# Patient Record
Sex: Female | Born: 1935 | Race: White | Hispanic: No | Smoking: Never smoker
Health system: Southern US, Community
[De-identification: ages and names within clinical notes are randomized; demographics above are authoritative.]

## PROBLEM LIST (undated history)

## (undated) DIAGNOSIS — I340 Nonrheumatic mitral (valve) insufficiency: Secondary | ICD-10-CM

## (undated) DIAGNOSIS — I4819 Other persistent atrial fibrillation: Secondary | ICD-10-CM

## (undated) DIAGNOSIS — I5042 Chronic combined systolic (congestive) and diastolic (congestive) heart failure: Secondary | ICD-10-CM

## (undated) DIAGNOSIS — I472 Ventricular tachycardia, unspecified: Secondary | ICD-10-CM

## (undated) DIAGNOSIS — I428 Other cardiomyopathies: Secondary | ICD-10-CM

## (undated) DIAGNOSIS — M1990 Unspecified osteoarthritis, unspecified site: Secondary | ICD-10-CM

## (undated) HISTORY — DX: Other cardiomyopathies: I42.8

## (undated) HISTORY — PX: SHOULDER SURGERY: SHX246

## (undated) HISTORY — DX: Ventricular tachycardia: I47.2

## (undated) HISTORY — PX: TONSILLECTOMY: SUR1361

## (undated) HISTORY — DX: Nonrheumatic mitral (valve) insufficiency: I34.0

## (undated) HISTORY — DX: Ventricular tachycardia, unspecified: I47.20

## (undated) HISTORY — PX: CARDIAC CATHETERIZATION: SHX172

## (undated) HISTORY — DX: Other persistent atrial fibrillation: I48.19

## (undated) HISTORY — PX: TUBAL LIGATION: SHX77

## (undated) HISTORY — PX: BUNIONECTOMY: SHX129

---

## 2013-03-17 ENCOUNTER — Encounter (HOSPITAL_COMMUNITY): Payer: Self-pay | Admitting: Emergency Medicine

## 2013-03-17 ENCOUNTER — Inpatient Hospital Stay (HOSPITAL_COMMUNITY)
Admission: EM | Admit: 2013-03-17 | Discharge: 2013-03-19 | DRG: 470 | Disposition: A | Discharge status: Home Health | Payer: Medicare Other | Attending: Internal Medicine | Admitting: Internal Medicine

## 2013-03-17 ENCOUNTER — Emergency Department (HOSPITAL_COMMUNITY): Payer: Medicare Other

## 2013-03-17 DIAGNOSIS — R296 Repeated falls: Secondary | ICD-10-CM | POA: Diagnosis present

## 2013-03-17 DIAGNOSIS — S72012A Unspecified intracapsular fracture of left femur, initial encounter for closed fracture: Secondary | ICD-10-CM

## 2013-03-17 DIAGNOSIS — S72002A Fracture of unspecified part of neck of left femur, initial encounter for closed fracture: Secondary | ICD-10-CM

## 2013-03-17 DIAGNOSIS — I4949 Other premature depolarization: Secondary | ICD-10-CM

## 2013-03-17 DIAGNOSIS — S72033A Displaced midcervical fracture of unspecified femur, initial encounter for closed fracture: Principal | ICD-10-CM | POA: Diagnosis present

## 2013-03-17 DIAGNOSIS — I517 Cardiomegaly: Secondary | ICD-10-CM

## 2013-03-17 DIAGNOSIS — S72019A Unspecified intracapsular fracture of unspecified femur, initial encounter for closed fracture: Secondary | ICD-10-CM

## 2013-03-17 DIAGNOSIS — I4589 Other specified conduction disorders: Secondary | ICD-10-CM | POA: Diagnosis present

## 2013-03-17 DIAGNOSIS — Y9389 Activity, other specified: Secondary | ICD-10-CM

## 2013-03-17 DIAGNOSIS — E785 Hyperlipidemia, unspecified: Secondary | ICD-10-CM

## 2013-03-17 DIAGNOSIS — I493 Ventricular premature depolarization: Secondary | ICD-10-CM | POA: Diagnosis present

## 2013-03-17 DIAGNOSIS — E7800 Pure hypercholesterolemia, unspecified: Secondary | ICD-10-CM | POA: Diagnosis present

## 2013-03-17 DIAGNOSIS — Z79899 Other long term (current) drug therapy: Secondary | ICD-10-CM

## 2013-03-17 HISTORY — DX: Unspecified osteoarthritis, unspecified site: M19.90

## 2013-03-17 LAB — URINALYSIS W MICROSCOPIC + REFLEX CULTURE
Bilirubin Urine: NEGATIVE
Glucose, UA: NEGATIVE mg/dL
Hgb urine dipstick: NEGATIVE
Ketones, ur: NEGATIVE mg/dL
Nitrite: NEGATIVE
Protein, ur: NEGATIVE mg/dL
Specific Gravity, Urine: 1.011 (ref 1.005–1.030)
Urobilinogen, UA: 0.2 mg/dL (ref 0.0–1.0)
pH: 7 (ref 5.0–8.0)

## 2013-03-17 LAB — CBC WITH DIFFERENTIAL/PLATELET
Basophils Absolute: 0 10*3/uL (ref 0.0–0.1)
Basophils Relative: 0 % (ref 0–1)
Eosinophils Absolute: 0.1 10*3/uL (ref 0.0–0.7)
Eosinophils Relative: 2 % (ref 0–5)
HCT: 40.7 % (ref 36.0–46.0)
Hemoglobin: 14.3 g/dL (ref 12.0–15.0)
Lymphocytes Relative: 24 % (ref 12–46)
Lymphs Abs: 1.3 10*3/uL (ref 0.7–4.0)
MCH: 31.2 pg (ref 26.0–34.0)
MCHC: 35.1 g/dL (ref 30.0–36.0)
MCV: 88.7 fL (ref 78.0–100.0)
Monocytes Absolute: 0.5 10*3/uL (ref 0.1–1.0)
Monocytes Relative: 10 % (ref 3–12)
Neutro Abs: 3.4 10*3/uL (ref 1.7–7.7)
Neutrophils Relative %: 64 % (ref 43–77)
Platelets: 196 10*3/uL (ref 150–400)
RBC: 4.59 MIL/uL (ref 3.87–5.11)
RDW: 12.2 % (ref 11.5–15.5)
WBC: 5.4 10*3/uL (ref 4.0–10.5)

## 2013-03-17 LAB — PROTIME-INR
INR: 0.98 (ref 0.00–1.49)
Prothrombin Time: 12.8 seconds (ref 11.6–15.2)

## 2013-03-17 LAB — BASIC METABOLIC PANEL
BUN: 14 mg/dL (ref 6–23)
CO2: 26 mEq/L (ref 19–32)
Calcium: 9.4 mg/dL (ref 8.4–10.5)
Chloride: 100 mEq/L (ref 96–112)
Creatinine, Ser: 0.59 mg/dL (ref 0.50–1.10)
GFR calc Af Amer: >90 mL/min (ref >90)
GFR calc non Af Amer: 87 mL/min — ABNORMAL LOW (ref >90)
Glucose, Bld: 97 mg/dL (ref 70–99)
Potassium: 3.8 mEq/L (ref 3.5–5.1)
Sodium: 136 mEq/L (ref 135–145)

## 2013-03-17 LAB — TROPONIN I: Troponin I: <0.3 ng/mL (ref <0.30)

## 2013-03-17 LAB — MAGNESIUM: Magnesium: 2.2 mg/dL (ref 1.5–2.5)

## 2013-03-17 LAB — APTT: aPTT: 28 seconds (ref 24–37)

## 2013-03-17 MED ORDER — PHENAZOPYRIDINE HCL 200 MG PO TABS
200.0000 mg | ORAL_TABLET | Freq: Once | ORAL | Status: AC
  Administered 2013-03-17: 200 mg via ORAL
  Filled 2013-03-17: qty 1

## 2013-03-17 MED ORDER — SODIUM CHLORIDE 0.9 % IJ SOLN: 3.0000 mL | Freq: Two times a day (BID) | INTRAMUSCULAR | Status: DC

## 2013-03-17 MED ORDER — MORPHINE SULFATE 4 MG/ML IJ SOLN
4.0000 mg | INTRAMUSCULAR | Status: DC | PRN
  Filled 2013-03-17: qty 1

## 2013-03-17 MED ORDER — MORPHINE SULFATE 2 MG/ML IJ SOLN: 1.0000 mg | INTRAMUSCULAR | Status: DC | PRN

## 2013-03-17 MED ORDER — ONDANSETRON HCL 4 MG PO TABS: 4.0000 mg | ORAL_TABLET | Freq: Four times a day (QID) | ORAL | Status: DC | PRN

## 2013-03-17 MED ORDER — ZOLPIDEM TARTRATE 5 MG PO TABS
5.0000 mg | ORAL_TABLET | Freq: Once | ORAL | Status: AC
  Administered 2013-03-17: 5 mg via ORAL
  Filled 2013-03-17: qty 1

## 2013-03-17 MED ORDER — SODIUM CHLORIDE 0.9 % IJ SOLN: 3.0000 mL | INTRAMUSCULAR | Status: DC | PRN

## 2013-03-17 MED ORDER — SODIUM CHLORIDE 0.9 % IV SOLN
INTRAVENOUS | Status: DC
  Administered 2013-03-17: 14:00:00 via INTRAVENOUS

## 2013-03-17 MED ORDER — ACETAMINOPHEN 650 MG RE SUPP: 650.0000 mg | Freq: Four times a day (QID) | RECTAL | Status: DC | PRN

## 2013-03-17 MED ORDER — ONDANSETRON HCL 4 MG/2ML IJ SOLN
4.0000 mg | INTRAMUSCULAR | Status: DC | PRN
  Filled 2013-03-17: qty 2

## 2013-03-17 MED ORDER — SIMVASTATIN 5 MG PO TABS
5.0000 mg | ORAL_TABLET | Freq: Every day | ORAL | Status: DC
  Filled 2013-03-17 (×2): qty 1

## 2013-03-17 MED ORDER — ONDANSETRON HCL 4 MG/2ML IJ SOLN: 4.0000 mg | Freq: Four times a day (QID) | INTRAMUSCULAR | Status: DC | PRN

## 2013-03-17 MED ORDER — CEFAZOLIN SODIUM-DEXTROSE 2-3 GM-% IV SOLR
2.0000 g | INTRAVENOUS | Status: DC
Start: 2013-03-18 — End: 2013-03-18

## 2013-03-17 MED ORDER — SODIUM CHLORIDE 0.9 % IV SOLN: 250.0000 mL | INTRAVENOUS | Status: DC | PRN

## 2013-03-17 MED ORDER — ENOXAPARIN SODIUM 40 MG/0.4ML ~~LOC~~ SOLN
40.0000 mg | Freq: Once | SUBCUTANEOUS | Status: AC
  Administered 2013-03-17: 40 mg via SUBCUTANEOUS
  Filled 2013-03-17: qty 0.4

## 2013-03-17 MED ORDER — OXYCODONE HCL 5 MG PO TABS: 5.0000 mg | ORAL_TABLET | ORAL | Status: DC | PRN

## 2013-03-17 MED ORDER — ACETAMINOPHEN 325 MG PO TABS
650.0000 mg | ORAL_TABLET | Freq: Four times a day (QID) | ORAL | Status: DC | PRN
  Administered 2013-03-18: 650 mg via ORAL
  Filled 2013-03-17: qty 2

## 2013-03-17 MED ORDER — SODIUM CHLORIDE 0.9 % IV SOLN
INTRAVENOUS | Status: DC
  Administered 2013-03-18: 08:00:00 via INTRAVENOUS

## 2013-03-17 MED ORDER — SODIUM CHLORIDE 0.9 % IJ SOLN
3.0000 mL | Freq: Two times a day (BID) | INTRAMUSCULAR | Status: DC
  Administered 2013-03-17: 3 mL via INTRAVENOUS

## 2013-03-17 NOTE — ED Provider Notes (Signed)
History    CSN: [id number] Arrival date & time 03/17/13  1234  First MD Initiated Contact with Patient 03/17/13 1253     Chief Complaint  Patient presents with  . Hip Pain   HPI Pt was seen at 1400.  Per pt, c/o gradual onset and persistence of constant left hip "pain" since 02/10/13. Pt states she was washing her car and tripped over a curb, landing on her left side/hip. Pt states she has been ambulatory since the fall, but has had persistent left hip pain which worsens with ambulation. Pt was eval by her PMD today, and then sent to the ED for admission for "a broken hip."  Denies prodromal symptoms before fall, no syncope/LOC, no AMS, no neck or back pain, no CP/SOB, no abd pain, no N/V/D, no fevers, no focal motor weakness, no tingling/numnbess in extremities, no new fall/injury.     History reviewed. No pertinent past medical history.   Past Surgical History  Procedure Laterality Date  . Tonsillectomy      History  Substance Use Topics  . Smoking status: Never Smoker   . Smokeless tobacco: Not on file  . Alcohol Use: 1.8 oz/week    3 Glasses of wine per week     Comment: per week    Review of Systems ROS: Statement: All systems negative except as marked or noted in the HPI; Constitutional: Negative for fever and chills. ; ; Eyes: Negative for eye pain, redness and discharge. ; ; ENMT: Negative for ear pain, hoarseness, nasal congestion, sinus pressure and sore throat. ; ; Cardiovascular: Negative for chest pain, palpitations, diaphoresis, dyspnea and peripheral edema. ; ; Respiratory: Negative for cough, wheezing and stridor. ; ; Gastrointestinal: Negative for nausea, vomiting, diarrhea, abdominal pain, blood in stool, hematemesis, jaundice and rectal bleeding. . ; ; Genitourinary: Negative for dysuria, flank pain and hematuria. ; ; Musculoskeletal: Negative for back pain and neck pain. +left hip pain and trauma.; ; Skin: Negative for pruritus, rash, abrasions, blisters, bruising  and skin lesion.; ; Neuro: Negative for headache, lightheadedness and neck stiffness. Negative for weakness, altered level of consciousness , altered mental status, extremity weakness, paresthesias, involuntary movement, seizure and syncope.       Allergies  Review of patient's allergies indicates no known allergies.  Home Medications   Current Outpatient Rx  Name  Route  Sig  Dispense  Refill  . naproxen sodium (ANAPROX) 220 MG tablet   Oral   Take 220 mg by mouth 2 (two) times daily with a meal.         . Omega-3 Fatty Acids (OMEGA 3 PO)   Oral   Take 2 capsules by mouth 2 (two) times daily.         . pseudoephedrine-acetaminophen (TYLENOL SINUS) 30-500 MG TABS   Oral   Take 81 tablets by mouth every 4 (four) hours as needed.         . simvastatin (ZOCOR) 10 MG tablet   Oral   Take 5 mg by mouth at bedtime.         Marland Kitchen zolpidem (AMBIEN) 5 MG tablet   Oral   Take 2.5 mg by mouth at bedtime as needed for sleep.          BP 131/89  Pulse 70  Temp(Src) 97.8 F (36.6 C) (Oral)  SpO2 98% Physical Exam 1405: Physical examination:  Nursing notes reviewed; Vital signs and O2 SAT reviewed;  Constitutional: Well developed, Well nourished, Well hydrated, In  no acute distress; Head:  Normocephalic, atraumatic; Eyes: EOMI, PERRL, No scleral icterus; ENMT: Mouth and pharynx normal, Mucous membranes moist; Neck: Supple, Full range of motion, No lymphadenopathy; Cardiovascular: Regular rate and rhythm, No gallop; Respiratory: Breath sounds clear & equal bilaterally, No rales, rhonchi, wheezes.  Speaking full sentences with ease, Normal respiratory effort/excursion; Chest: Nontender, Movement normal; Abdomen: Soft, Nontender, Nondistended, Normal bowel sounds; Genitourinary: No CVA tenderness; Extremities: Pulses normal, +left hip tenderness to palp. Pelvis stable. NMS intact left foot. No edema, No calf edema or asymmetry.; Neuro: AA&Ox3, Major CN grossly intact.  Speech clear. No  gross focal motor or sensory deficits in extremities.; Skin: Color normal, Warm, Dry.   ED Course  Procedures    MDM  MDM Reviewed: nursing note and vitals Reviewed previous: x-ray Interpretation: labs, ECG and x-ray    Date: 03/17/2013  Rate: 76  Rhythm: normal sinus rhythm and premature ventricular contractions (PVC)  QRS Axis: left  Intervals: normal  ST/T Wave abnormalities: normal  Conduction Disutrbances:nonspecific intraventricular conduction delay  Narrative Interpretation:   Old EKG Reviewed: none available.  Results for orders placed during the hospital encounter of [id number]  BASIC METABOLIC PANEL      Result Value Range   Sodium 136  135 - 145 mEq/L   Potassium 3.8  3.5 - 5.1 mEq/L   Chloride 100  96 - 112 mEq/L   CO2 26  19 - 32 mEq/L   Glucose, Bld 97  70 - 99 mg/dL   BUN 14  6 - 23 mg/dL   Creatinine, Ser 0.59  0.50 - 1.10 mg/dL   Calcium 9.4  8.4 - 10.5 mg/dL   GFR calc non Af Amer 87 (*) >90 mL/min   GFR calc Af Amer >90  >90 mL/min  CBC WITH DIFFERENTIAL      Result Value Range   WBC 5.4  4.0 - 10.5 K/uL   RBC 4.59  3.87 - 5.11 MIL/uL   Hemoglobin 14.3  12.0 - 15.0 g/dL   HCT 40.7  36.0 - 46.0 %   MCV 88.7  78.0 - 100.0 fL   MCH 31.2  26.0 - 34.0 pg   MCHC 35.1  30.0 - 36.0 g/dL   RDW 12.2  11.5 - 15.5 %   Platelets 196  150 - 400 K/uL   Neutrophils Relative % 64  43 - 77 %   Neutro Abs 3.4  1.7 - 7.7 K/uL   Lymphocytes Relative 24  12 - 46 %   Lymphs Abs 1.3  0.7 - 4.0 K/uL   Monocytes Relative 10  3 - 12 %   Monocytes Absolute 0.5  0.1 - 1.0 K/uL   Eosinophils Relative 2  0 - 5 %   Eosinophils Absolute 0.1  0.0 - 0.7 K/uL   Basophils Relative 0  0 - 1 %   Basophils Absolute 0.0  0.0 - 0.1 K/uL  TROPONIN I      Result Value Range   Troponin I <0.30  <0.30 ng/mL  PROTIME-INR      Result Value Range   Prothrombin Time 12.8  11.6 - 15.2 seconds   INR 0.98  0.00 - 1.49  APTT      Result Value Range   aPTT 28  24 - 37 seconds   Dg  Chest Port 1 View 03/17/2013   *RADIOLOGY REPORT*  Clinical Data: Preoperative respiratory evaluation for hip fracture.  PORTABLE CHEST - 1 VIEW  Comparison: None.  Findings: The cardiopericardial  silhouette is enlarged.  No focal airspace consolidation or pulmonary edema. Imaged bony structures of the thorax are intact. Interstitial markings are diffusely coarsened with chronic features.  IMPRESSION: Cardiomegaly and mild interstitial coarsening without acute cardiopulmonary findings.   Original Report Authenticated By: Misty Stanley, M.D.    XR left hip 03/17/2013: IMPRESSION: Subcapital fracture left femoral neck. Original Report Authenticated By: Carl Best, MD    1415:  Pt requesting [facility] to be called.  T/C to [facility] Dr. Theda Sers, case discussed, including:  HPI, pertinent PM/SHx, VS/PE, dx testing, ED course and treatment:  Agreeable to consult, requests to keep NPO and admit to medicine service.  1520:  T/C to [facility] Dr. Maryland Pink, case discussed, including:  HPI, pertinent PM/SHx, VS/PE, dx testing, ED course and treatment:  Agreeable to admit, requests to write temporary orders, obtain medical bed on Ortho floor to Dr. Lina Sar service.   Alfonzo Feller, DO 03/18/13 1952

## 2013-03-17 NOTE — Consult Note (Signed)
Reason for Consult: Left Femoral Neck Fracture Closed Referring Physician: Dr. Orlean Bradford P [name] is an 78 y.o. female.  HPI:  Cheryl Vargas is a pleasant female who suffered from a fall approxamately one month ago. Cheryl Vargas was urged by family and care givers to have it evaluated sooner, but do to her husband being diagnosed with metastatic cancer she did not seek treatment until now.  Cheryl Vargas intially felt it was a musculoskeletal strain, but her pain continued and she had to modified her ambulation technique. She was accompanied to the ER with her daughter. Radiograph shows Left Femoral Neck fracture. Cheryl Vargas wishes to proceed with surgical intervention by Dr. Alvan Dame. Denies any know osteoporosis or DM. Other history as below.  Past Medical History  Diagnosis Date  . Enlarged heart   . Dysrhythmia     pvc's per pt  . Arthritis   . PONV (postoperative nausea and vomiting)     Past Surgical History  Procedure Laterality Date  . Tonsillectomy    . Tubal ligation    . Shoulder surgery      closed reduction  . Bunionectomy    . Cardiac catheterization      in 2004 at [place]. "Insignificant blockage" per Cheryl Vargas    Family History  Problem Relation Age of Onset  . Lung cancer Mother   . Heart attack Father     Social History:  reports that she has never smoked. She has never used smokeless tobacco. She reports that she drinks about 1.8 ounces of alcohol per week. She reports that she does not use illicit drugs.  Allergies: No Known Allergies  Medications: I have reviewed the Cheryl Vargas's current medications.  Results for orders placed during the hospital encounter of 03/17/13 (from the past 48 hour(s))  BASIC METABOLIC PANEL     Status: Abnormal   Collection Time    03/17/13  2:00 PM      Result Value Range   Sodium 136  135 - 145 mEq/L   Potassium 3.8  3.5 - 5.1 mEq/L   Chloride 100  96 - 112 mEq/L   CO2 26  19 - 32 mEq/L   Glucose, Bld 97  70 - 99 mg/dL   BUN 14  6 - 23  mg/dL   Creatinine, Ser 0.59  0.50 - 1.10 mg/dL   Calcium 9.4  8.4 - 10.5 mg/dL   GFR calc non Af Amer 87 (*) >90 mL/min   GFR calc Af Amer >90  >90 mL/min   Comment:            The eGFR has been calculated     using the CKD EPI equation.     This calculation has not been     validated in all clinical     situations.     eGFR's persistently     <90 mL/min signify     possible Chronic Kidney Disease.  CBC WITH DIFFERENTIAL     Status: None   Collection Time    03/17/13  2:00 PM      Result Value Range   WBC 5.4  4.0 - 10.5 K/uL   RBC 4.59  3.87 - 5.11 MIL/uL   Hemoglobin 14.3  12.0 - 15.0 g/dL   HCT 40.7  36.0 - 46.0 %   MCV 88.7  78.0 - 100.0 fL   MCH 31.2  26.0 - 34.0 pg   MCHC 35.1  30.0 - 36.0 g/dL   RDW 12.2  11.5 - 15.5 %  Platelets 196  150 - 400 K/uL   Neutrophils Relative % 64  43 - 77 %   Neutro Abs 3.4  1.7 - 7.7 K/uL   Lymphocytes Relative 24  12 - 46 %   Lymphs Abs 1.3  0.7 - 4.0 K/uL   Monocytes Relative 10  3 - 12 %   Monocytes Absolute 0.5  0.1 - 1.0 K/uL   Eosinophils Relative 2  0 - 5 %   Eosinophils Absolute 0.1  0.0 - 0.7 K/uL   Basophils Relative 0  0 - 1 %   Basophils Absolute 0.0  0.0 - 0.1 K/uL  TROPONIN I     Status: None   Collection Time    03/17/13  2:00 PM      Result Value Range   Troponin I <0.30  <0.30 ng/mL   Comment:            Due to the release kinetics of cTnI,     a negative result within the first hours     of the onset of symptoms does not rule out     myocardial infarction with certainty.     If myocardial infarction is still suspected,     repeat the test at appropriate intervals.  PROTIME-INR     Status: None   Collection Time    03/17/13  2:00 PM      Result Value Range   Prothrombin Time 12.8  11.6 - 15.2 seconds   INR 0.98  0.00 - 1.49  APTT     Status: None   Collection Time    03/17/13  2:00 PM      Result Value Range   aPTT 28  24 - 37 seconds  URINALYSIS W MICROSCOPIC + REFLEX CULTURE     Status: Abnormal    Collection Time    03/17/13  2:23 PM      Result Value Range   Color, Urine YELLOW  YELLOW   APPearance CLEAR  CLEAR   Specific Gravity, Urine 1.011  1.005 - 1.030   pH 7.0  5.0 - 8.0   Glucose, UA NEGATIVE  NEGATIVE mg/dL   Hgb urine dipstick NEGATIVE  NEGATIVE   Bilirubin Urine NEGATIVE  NEGATIVE   Ketones, ur NEGATIVE  NEGATIVE mg/dL   Protein, ur NEGATIVE  NEGATIVE mg/dL   Urobilinogen, UA 0.2  0.0 - 1.0 mg/dL   Nitrite NEGATIVE  NEGATIVE   Leukocytes, UA MODERATE (*) NEGATIVE   WBC, UA 3-6  <3 WBC/hpf   Bacteria, UA MANY (*) RARE    Dg Chest Port 1 View  03/17/2013   *RADIOLOGY REPORT*  Clinical Data: Preoperative respiratory evaluation for hip fracture.  PORTABLE CHEST - 1 VIEW  Comparison: None.  Findings: The cardiopericardial silhouette is enlarged.  No focal airspace consolidation or pulmonary edema. Imaged bony structures of the thorax are intact. Interstitial markings are diffusely coarsened with chronic features.  IMPRESSION: Cardiomegaly and mild interstitial coarsening without acute cardiopulmonary findings.   Original Report Authenticated By: Misty Stanley, M.D.    Review of Systems  Constitutional: Negative.  Negative for fever and chills.  HENT: Negative.   Eyes: Negative.   Respiratory: Negative.   Cardiovascular: Negative.   Gastrointestinal: Negative.   Genitourinary: Negative.   Musculoskeletal: Positive for joint pain (Left hip ).  Skin: Negative.   Neurological: Negative.   Endo/Heme/Allergies: Negative.   Psychiatric/Behavioral: Negative.    Blood pressure 131/89, pulse 70, temperature 97.8 F (36.6 C), temperature source  Oral, SpO2 98.00%. Physical Exam  Constitutional: She is oriented to person, place, and time. She appears well-developed and well-nourished.  HENT:  Head: Normocephalic and atraumatic.  Eyes: EOM are normal.  Neck: Normal range of motion. Neck supple.  Cardiovascular: Normal rate and regular rhythm.   Respiratory: Effort normal.   GI: Soft. Bowel sounds are normal. She exhibits no distension. There is no tenderness.  Genitourinary:  Deffered  Musculoskeletal: She exhibits tenderness (Left hip).  Left LE neurovascularly intact. L Calf soft and non tender.  Neurological: She is alert and oriented to person, place, and time.  Skin: Skin is warm and dry.  Psychiatric: Her behavior is normal.    Assessment/Plan: Plan for Hemi vs total Left hip arthroplasty to be done tomorrow by Dr. Alvan Dame. Obtain Consent NPO after MN Lovenox tonight for DVT prophylaxis x1 dose Bedrest Medicine to consult for home medications and other recommendations.  Saquoia Sianez L 03/17/2013, 4:47 PM

## 2013-03-17 NOTE — ED Notes (Signed)
Pt states she fell on June 2 and has been dealing with the pain due to her husband's new diagnosis of cancer. Pt was seen at [facility] PCP today and was sent here due to the fractured left hip according to STAT xray reading at their office.

## 2013-03-17 NOTE — H&P (Addendum)
[facility] History and Physical  Shakina Hillier Dempsey [id number] DOB: 1935/03/26 DOA: 03/17/2013   PCP: Irven Shelling, MD  Specialists: None  Chief Complaint: Pain in the left hip  HPI: Cheryl Vargas is a 78 y.o. female with the past medical history of hypercholesterolemia, who was in her usual state of health till June 2, when she took her husband to a doctor's appointment and then decided to wash her car herself. She got tangled up, twisted her foot and fell on her left hip. Denies any head injuries. She was able to get up and walk with the help of crutches and canes, but could not bear weight on that left leg. Her husband, during that time, was diagnosed with metastatic lung cancer and she was taking him for his follow up appointments. The pain in the left hip continued to bother the patient. She tried taking Aleve with only partial relief. And, then finally yesterday she couldn't bear the pain and then decided to seek attention by going to her primary care physician's office. The pain was 10 out of 10 in intensity. This is only when she tries to bear weight. When she is lying down on the bed she doesn't have any pain. In the PCPs office she was diagnosed as having a subcapital fracture of the left hip. She was subsequently sent over to the emergency department. She denies any chest pain or shortness of breath with her usual activities. She is quite active. However, she tells me, that she about 10 years ago she had a cardiac catheterization in [place], [place] for reasons that are not entirely clear. She was told that she had "insignificant blockage". Denies any stress testing since then. She was actually put on a beta blocker at that time, but then it was discontinued. Denies any leg swelling.   Home Medications: Prior to Admission medications   Medication Sig Start Date End Date Taking? Authorizing Provider  naproxen sodium (ANAPROX) 220 MG tablet Take 220 mg by mouth 2 (two) times  daily with a meal.   Yes Historical Provider, MD  Omega-3 Fatty Acids (OMEGA 3 PO) Take 2 capsules by mouth 2 (two) times daily.   Yes Historical Provider, MD  pseudoephedrine-acetaminophen (TYLENOL SINUS) 30-500 MG TABS Take 81 tablets by mouth every 4 (four) hours as needed.   Yes Historical Provider, MD  simvastatin (ZOCOR) 10 MG tablet Take 5 mg by mouth at bedtime.   Yes Historical Provider, MD  zolpidem (AMBIEN) 5 MG tablet Take 2.5 mg by mouth at bedtime as needed for sleep.   Yes Historical Provider, MD    Allergies: No Known Allergies  Past Medical History: History reviewed. No pertinent past medical history.  Past Surgical History  Procedure Laterality Date  . Tonsillectomy    . Tubal ligation    . Shoulder surgery    . Bunionectomy    . Cardiac catheterization      in 2004 at [place]. "Insignificant blockage" per patient    Social History:  reports that she has never smoked. She does not have any smokeless tobacco history on file. She reports that she drinks about 1.8 ounces of alcohol per week. She reports that she does not use illicit drugs.  Living Situation:  Lives with her husband  Activity Level:  usually quite independent with daily activities    Family History:  Family History  Problem Relation Age of Onset  . Lung cancer Mother   . Heart attack Father  Review of Systems - History obtained from the patient General ROS: positive for  - fatigue Psychological ROS: negative Ophthalmic ROS: negative ENT ROS: negative Allergy and Immunology ROS: negative Hematological and Lymphatic ROS: negative Endocrine ROS: negative Respiratory ROS: no cough, shortness of breath, or wheezing Cardiovascular ROS: occasionaly feeling of irregular heart beat versus missed beat Gastrointestinal ROS: no abdominal pain, change in bowel habits, or black or bloody stools Genito-Urinary ROS: no dysuria, trouble voiding, or hematuria Musculoskeletal ROS: negative Neurological  ROS: no TIA or stroke symptoms Dermatological ROS: negative  Physical Examination  Filed Vitals:   03/17/13 1300  BP: 131/89  Pulse: 70  Temp: 97.8 F (36.6 C)  TempSrc: Oral  SpO2: 98%    General appearance: alert, cooperative, appears stated age and no distress Head: Normocephalic, without obvious abnormality, atraumatic Eyes: conjunctivae/corneas clear. PERRL, EOM's intact.  Throat: lips, mucosa, and tongue normal; teeth and gums normal Neck: no adenopathy, no carotid bruit, no JVD, supple, symmetrical, trachea midline and thyroid not enlarged, symmetric, no tenderness/mass/nodules Resp: clear to auscultation bilaterally Cardio: regular rate and rhythm, S1, S2 normal, occasional premature beats, no murmur, click, rub or gallop GI: soft, non-tender; bowel sounds normal; no masses,  no organomegaly Extremities: extremities normal, atraumatic, no cyanosis or edema Pulses: 2+ and symmetric Skin: Skin color, texture, turgor normal. No rashes or lesions Lymph nodes: Cervical, supraclavicular, and axillary nodes normal. Neurologic: Alert and oriented. No focal deficits.  Laboratory Data: Results for orders placed during the hospital encounter of 03/17/13 (from the past 48 hour(s))  BASIC METABOLIC PANEL     Status: Abnormal   Collection Time    03/17/13  2:00 PM      Result Value Range   Sodium 136  135 - 145 mEq/L   Potassium 3.8  3.5 - 5.1 mEq/L   Chloride 100  96 - 112 mEq/L   CO2 26  19 - 32 mEq/L   Glucose, Bld 97  70 - 99 mg/dL   BUN 14  6 - 23 mg/dL   Creatinine, Ser 0.59  0.50 - 1.10 mg/dL   Calcium 9.4  8.4 - 10.5 mg/dL   GFR calc non Af Amer 87 (*) >90 mL/min   GFR calc Af Amer >90  >90 mL/min   Comment:            The eGFR has been calculated     using the CKD EPI equation.     This calculation has not been     validated in all clinical     situations.     eGFR's persistently     <90 mL/min signify     possible Chronic Kidney Disease.  CBC WITH  DIFFERENTIAL     Status: None   Collection Time    03/17/13  2:00 PM      Result Value Range   WBC 5.4  4.0 - 10.5 K/uL   RBC 4.59  3.87 - 5.11 MIL/uL   Hemoglobin 14.3  12.0 - 15.0 g/dL   HCT 40.7  36.0 - 46.0 %   MCV 88.7  78.0 - 100.0 fL   MCH 31.2  26.0 - 34.0 pg   MCHC 35.1  30.0 - 36.0 g/dL   RDW 12.2  11.5 - 15.5 %   Platelets 196  150 - 400 K/uL   Neutrophils Relative % 64  43 - 77 %   Neutro Abs 3.4  1.7 - 7.7 K/uL   Lymphocytes Relative 24  12 -  46 %   Lymphs Abs 1.3  0.7 - 4.0 K/uL   Monocytes Relative 10  3 - 12 %   Monocytes Absolute 0.5  0.1 - 1.0 K/uL   Eosinophils Relative 2  0 - 5 %   Eosinophils Absolute 0.1  0.0 - 0.7 K/uL   Basophils Relative 0  0 - 1 %   Basophils Absolute 0.0  0.0 - 0.1 K/uL  TROPONIN I     Status: None   Collection Time    03/17/13  2:00 PM      Result Value Range   Troponin I <0.30  <0.30 ng/mL   Comment:            Due to the release kinetics of cTnI,     a negative result within the first hours     of the onset of symptoms does not rule out     myocardial infarction with certainty.     If myocardial infarction is still suspected,     repeat the test at appropriate intervals.  PROTIME-INR     Status: None   Collection Time    03/17/13  2:00 PM      Result Value Range   Prothrombin Time 12.8  11.6 - 15.2 seconds   INR 0.98  0.00 - 1.49  APTT     Status: None   Collection Time    03/17/13  2:00 PM      Result Value Range   aPTT 28  24 - 37 seconds    Radiology Reports: Dg Chest Port 1 View  03/17/2013   *RADIOLOGY REPORT*  Clinical Data: Preoperative respiratory evaluation for hip fracture.  PORTABLE CHEST - 1 VIEW  Comparison: None.  Findings: The cardiopericardial silhouette is enlarged.  No focal airspace consolidation or pulmonary edema. Imaged bony structures of the thorax are intact. Interstitial markings are diffusely coarsened with chronic features.  IMPRESSION: Cardiomegaly and mild interstitial coarsening without acute  cardiopulmonary findings.   Original Report Authenticated By: Misty Stanley, M.D.    Electrocardiogram:  EKG shows sinus rhythm at 76 beats per minute. PVCs are noted. There is evidence for intraventricular conduction delay. No Q waves. No ST or T-wave changes are noted. No older EKGs available for comparison  Problem List  Principal Problem:   Closed left hip fracture Active Problems:   Cardiomegaly   PVC (premature ventricular contraction)   Hyperlipidemia   Assessment: This is a 78 year old, Caucasian female, with the past medical history of hypercholesterolemia, who presents with a fall sustained more than a month ago. She's had inability bearing weight on that left leg. She's found to have hip fracture, for which she requires definitive management. However, patient is also found to have cardiomegaly on chest x-ray and has PVCs on telemetry monitor as well as EKG. She also has evidence for Intra ventricular conduction delay on EKG.  Plan: #1 left hip fracture: The circumstances of her cardiac catheterization 10 years ago are not entirely clear. Chest x-ray does suggest cardiomegaly and with PVC seen on her EKG she requires further testing before she can proceed to surgery. An echocardiogram will be ordered, and hopefully, can be done today. I would recommend waiting till echocardiogram results are available before she undergo the surgery. Pain control will be provided. Patient will be on bed rest. [facility] orthopedics has been consulted by ED physician and we await their input.  #2 Cardiomegaly with PVCs and intraventricular conduction delay: As mentioned above an echocardiogram will be  obtained. Continue to monitor on telemetry. Electrolytes are normal. We will check a magnesium level. There are no clinical signs of congestive heart failure.  #3 history of hyperlipidemia: Continue with her statin medication from tomorrow.  DVT Prophylaxis:  SCDs  Code Status:  full code  Family  Communication:  discussed with the patient and her son   Disposition Plan: admit to telemetry    Further management decisions will depend on results of further testing and patient's response to treatment.  [facility] Pager [phone]  If 7PM-7AM, please contact night-coverage. [email or web address]. Password [email or web address]  03/17/2013, 4:05 PM

## 2013-03-18 ENCOUNTER — Inpatient Hospital Stay (HOSPITAL_COMMUNITY): Payer: Medicare Other

## 2013-03-18 ENCOUNTER — Encounter (HOSPITAL_COMMUNITY)
Admission: EM | Disposition: A | Discharge status: Home Health | Payer: Self-pay | Source: Home / Self Care | Attending: Internal Medicine

## 2013-03-18 ENCOUNTER — Encounter (HOSPITAL_COMMUNITY): Payer: Self-pay | Admitting: Registered Nurse

## 2013-03-18 ENCOUNTER — Inpatient Hospital Stay (HOSPITAL_COMMUNITY): Payer: Medicare Other | Admitting: Registered Nurse

## 2013-03-18 HISTORY — PX: TOTAL HIP ARTHROPLASTY: SHX124

## 2013-03-18 LAB — TSH: TSH: 4.203 u[IU]/mL (ref 0.350–4.500)

## 2013-03-18 LAB — COMPREHENSIVE METABOLIC PANEL
ALT: 11 U/L (ref 0–35)
AST: 16 U/L (ref 0–37)
Albumin: 3.3 g/dL — ABNORMAL LOW (ref 3.5–5.2)
Alkaline Phosphatase: 90 U/L (ref 39–117)
BUN: 15 mg/dL (ref 6–23)
CO2: 25 mEq/L (ref 19–32)
Calcium: 9.2 mg/dL (ref 8.4–10.5)
Chloride: 103 mEq/L (ref 96–112)
Creatinine, Ser: 0.61 mg/dL (ref 0.50–1.10)
GFR calc Af Amer: >90 mL/min (ref >90)
GFR calc non Af Amer: 87 mL/min — ABNORMAL LOW (ref >90)
Glucose, Bld: 99 mg/dL (ref 70–99)
Potassium: 3.9 mEq/L (ref 3.5–5.1)
Sodium: 135 mEq/L (ref 135–145)
Total Bilirubin: 0.6 mg/dL (ref 0.3–1.2)
Total Protein: 6.8 g/dL (ref 6.0–8.3)

## 2013-03-18 LAB — CBC
HCT: 40.7 % (ref 36.0–46.0)
Hemoglobin: 14.4 g/dL (ref 12.0–15.0)
MCH: 31.4 pg (ref 26.0–34.0)
MCHC: 35.4 g/dL (ref 30.0–36.0)
MCV: 88.7 fL (ref 78.0–100.0)
Platelets: 186 10*3/uL (ref 150–400)
RBC: 4.59 MIL/uL (ref 3.87–5.11)
RDW: 12.1 % (ref 11.5–15.5)
WBC: 4.7 10*3/uL (ref 4.0–10.5)

## 2013-03-18 LAB — TYPE AND SCREEN
ABO/RH(D): O POS
Antibody Screen: NEGATIVE

## 2013-03-18 LAB — SURGICAL PCR SCREEN
MRSA, PCR: NEGATIVE
Staphylococcus aureus: NEGATIVE

## 2013-03-18 LAB — ABO/RH: ABO/RH(D): O POS

## 2013-03-18 SURGERY — ARTHROPLASTY, HIP, TOTAL, ANTERIOR APPROACH
Anesthesia: General | Site: Hip | Laterality: Left | Wound class: Clean

## 2013-03-18 MED ORDER — DOCUSATE SODIUM 100 MG PO CAPS
100.0000 mg | ORAL_CAPSULE | Freq: Two times a day (BID) | ORAL | Status: DC
  Administered 2013-03-19 (×2): 100 mg via ORAL
  Filled 2013-03-18 (×4): qty 1

## 2013-03-18 MED ORDER — ONDANSETRON HCL 4 MG/2ML IJ SOLN
INTRAMUSCULAR | Status: DC | PRN
  Administered 2013-03-18: 4 mg via INTRAVENOUS

## 2013-03-18 MED ORDER — DEXTROSE 5 % IV SOLN
500.0000 mg | Freq: Four times a day (QID) | INTRAVENOUS | Status: DC | PRN
  Filled 2013-03-18: qty 5

## 2013-03-18 MED ORDER — FENTANYL CITRATE 0.05 MG/ML IJ SOLN: 25.0000 ug | INTRAMUSCULAR | Status: DC | PRN

## 2013-03-18 MED ORDER — NEOSTIGMINE METHYLSULFATE 1 MG/ML IJ SOLN
INTRAMUSCULAR | Status: DC | PRN
  Administered 2013-03-18: 2 mg via INTRAVENOUS

## 2013-03-18 MED ORDER — GLYCOPYRROLATE 0.2 MG/ML IJ SOLN
INTRAMUSCULAR | Status: DC | PRN
  Administered 2013-03-18: .4 mg via INTRAVENOUS

## 2013-03-18 MED ORDER — PROPOFOL 10 MG/ML IV BOLUS
INTRAVENOUS | Status: DC | PRN
  Administered 2013-03-18: 120 mg via INTRAVENOUS

## 2013-03-18 MED ORDER — LACTATED RINGERS IV SOLN
INTRAVENOUS | Status: DC | PRN
  Administered 2013-03-18 (×3): via INTRAVENOUS

## 2013-03-18 MED ORDER — ONDANSETRON HCL 4 MG/2ML IJ SOLN
4.0000 mg | Freq: Four times a day (QID) | INTRAMUSCULAR | Status: DC | PRN
  Administered 2013-03-19: 4 mg via INTRAVENOUS
  Filled 2013-03-18: qty 2

## 2013-03-18 MED ORDER — ROCURONIUM BROMIDE 100 MG/10ML IV SOLN
INTRAVENOUS | Status: DC | PRN
  Administered 2013-03-18: 50 mg via INTRAVENOUS

## 2013-03-18 MED ORDER — METOCLOPRAMIDE HCL 5 MG/ML IJ SOLN: 5.0000 mg | Freq: Three times a day (TID) | INTRAMUSCULAR | Status: DC | PRN

## 2013-03-18 MED ORDER — METHOCARBAMOL 500 MG PO TABS: 500.0000 mg | ORAL_TABLET | Freq: Four times a day (QID) | ORAL | Status: DC | PRN

## 2013-03-18 MED ORDER — MIDAZOLAM HCL 5 MG/5ML IJ SOLN
INTRAMUSCULAR | Status: DC | PRN
  Administered 2013-03-18: 2 mg via INTRAVENOUS

## 2013-03-18 MED ORDER — FERROUS SULFATE 325 (65 FE) MG PO TABS
325.0000 mg | ORAL_TABLET | Freq: Three times a day (TID) | ORAL | Status: DC
  Administered 2013-03-19 (×2): 325 mg via ORAL
  Filled 2013-03-18 (×4): qty 1

## 2013-03-18 MED ORDER — ACETAMINOPHEN 650 MG RE SUPP: 650.0000 mg | Freq: Four times a day (QID) | RECTAL | Status: DC | PRN

## 2013-03-18 MED ORDER — ACETAMINOPHEN 10 MG/ML IV SOLN
INTRAVENOUS | Status: DC | PRN
  Administered 2013-03-18: 1000 mg via INTRAVENOUS

## 2013-03-18 MED ORDER — CEFAZOLIN SODIUM 1-5 GM-% IV SOLN
1.0000 g | Freq: Four times a day (QID) | INTRAVENOUS | Status: AC
  Administered 2013-03-19 (×2): 1 g via INTRAVENOUS
  Filled 2013-03-18 (×2): qty 50

## 2013-03-18 MED ORDER — ACETAMINOPHEN 325 MG PO TABS: 650.0000 mg | ORAL_TABLET | Freq: Four times a day (QID) | ORAL | Status: DC | PRN

## 2013-03-18 MED ORDER — POLYETHYLENE GLYCOL 3350 17 G PO PACK
17.0000 g | PACK | Freq: Every day | ORAL | Status: DC | PRN
  Filled 2013-03-18: qty 1

## 2013-03-18 MED ORDER — FENTANYL CITRATE 0.05 MG/ML IJ SOLN
INTRAMUSCULAR | Status: DC | PRN
  Administered 2013-03-18: 100 ug via INTRAVENOUS
  Administered 2013-03-18: 50 ug via INTRAVENOUS
  Administered 2013-03-18: 100 ug via INTRAVENOUS
  Administered 2013-03-18: 50 ug via INTRAVENOUS
  Administered 2013-03-18 (×2): 100 ug via INTRAVENOUS

## 2013-03-18 MED ORDER — PROMETHAZINE HCL 25 MG/ML IJ SOLN
6.2500 mg | INTRAMUSCULAR | Status: DC | PRN
  Administered 2013-03-19: 12.5 mg via INTRAVENOUS
  Filled 2013-03-18: qty 1

## 2013-03-18 MED ORDER — HYDROMORPHONE HCL PF 1 MG/ML IJ SOLN
INTRAMUSCULAR | Status: DC | PRN
  Administered 2013-03-18 (×2): 1 mg via INTRAVENOUS

## 2013-03-18 MED ORDER — DEXAMETHASONE SODIUM PHOSPHATE 10 MG/ML IJ SOLN
INTRAMUSCULAR | Status: DC | PRN
  Administered 2013-03-18: 10 mg via INTRAVENOUS

## 2013-03-18 MED ORDER — MORPHINE SULFATE 2 MG/ML IJ SOLN
0.5000 mg | INTRAMUSCULAR | Status: DC | PRN
  Administered 2013-03-19: 0.5 mg via INTRAVENOUS
  Filled 2013-03-18: qty 1

## 2013-03-18 MED ORDER — POTASSIUM CHLORIDE 2 MEQ/ML IV SOLN
INTRAVENOUS | Status: DC
  Administered 2013-03-18: via INTRAVENOUS
  Filled 2013-03-18 (×2): qty 1000

## 2013-03-18 MED ORDER — HYDROCODONE-ACETAMINOPHEN 5-325 MG PO TABS
1.0000 | ORAL_TABLET | Freq: Four times a day (QID) | ORAL | Status: DC | PRN
  Administered 2013-03-19 (×2): 2 via ORAL
  Administered 2013-03-19: 1 via ORAL
  Filled 2013-03-18: qty 2
  Filled 2013-03-18: qty 1
  Filled 2013-03-18: qty 2

## 2013-03-18 MED ORDER — METOCLOPRAMIDE HCL 10 MG PO TABS: 5.0000 mg | ORAL_TABLET | Freq: Three times a day (TID) | ORAL | Status: DC | PRN

## 2013-03-18 MED ORDER — ACETAMINOPHEN 10 MG/ML IV SOLN
1000.0000 mg | Freq: Once | INTRAVENOUS | Status: DC
  Filled 2013-03-18: qty 100

## 2013-03-18 MED ORDER — PHENYLEPHRINE HCL 10 MG/ML IJ SOLN
INTRAMUSCULAR | Status: DC | PRN
  Administered 2013-03-18 (×5): 80 ug via INTRAVENOUS

## 2013-03-18 MED ORDER — CEFAZOLIN SODIUM-DEXTROSE 2-3 GM-% IV SOLR
2.0000 g | INTRAVENOUS | Status: AC
  Administered 2013-03-18: 2 g via INTRAVENOUS

## 2013-03-18 MED ORDER — LIDOCAINE HCL (CARDIAC) 20 MG/ML IV SOLN
INTRAVENOUS | Status: DC | PRN
  Administered 2013-03-18: 40 mg via INTRAVENOUS

## 2013-03-18 MED ORDER — MENTHOL 3 MG MT LOZG: 1.0000 | LOZENGE | OROMUCOSAL | Status: DC | PRN

## 2013-03-18 MED ORDER — ONDANSETRON HCL 4 MG PO TABS: 4.0000 mg | ORAL_TABLET | Freq: Four times a day (QID) | ORAL | Status: DC | PRN

## 2013-03-18 MED ORDER — PHENOL 1.4 % MT LIQD: 1.0000 | OROMUCOSAL | Status: DC | PRN

## 2013-03-18 MED ORDER — ASPIRIN EC 325 MG PO TBEC
325.0000 mg | DELAYED_RELEASE_TABLET | Freq: Two times a day (BID) | ORAL | Status: DC
  Administered 2013-03-19 (×2): 325 mg via ORAL
  Filled 2013-03-18 (×3): qty 1

## 2013-03-18 MED ORDER — PHENYLEPHRINE HCL 10 MG/ML IJ SOLN
20.0000 mg | INTRAVENOUS | Status: DC | PRN
  Administered 2013-03-18: 10 ug/min via INTRAVENOUS

## 2013-03-18 SURGICAL SUPPLY — 38 items
BAG ZIPLOCK 12X15 (MISCELLANEOUS) ×4 IMPLANT
BLADE SAW SGTL 18X1.27X75 (BLADE) ×2 IMPLANT
CAPT HIP PF COP ×2 IMPLANT
CLOTH BEACON ORANGE TIMEOUT ST (SAFETY) ×2 IMPLANT
DERMABOND ADVANCED (GAUZE/BANDAGES/DRESSINGS) ×1
DERMABOND ADVANCED .7 DNX12 (GAUZE/BANDAGES/DRESSINGS) ×1 IMPLANT
DRAPE C-ARM 42X120 X-RAY (DRAPES) ×2 IMPLANT
DRAPE STERI IOBAN 125X83 (DRAPES) ×2 IMPLANT
DRAPE U-SHAPE 47X51 STRL (DRAPES) ×6 IMPLANT
DRSG AQUACEL AG ADV 3.5X10 (GAUZE/BANDAGES/DRESSINGS) ×2 IMPLANT
DRSG TEGADERM 4X4.75 (GAUZE/BANDAGES/DRESSINGS) IMPLANT
DURAPREP 26ML APPLICATOR (WOUND CARE) ×2 IMPLANT
ELECT BLADE TIP CTD 4 INCH (ELECTRODE) ×2 IMPLANT
ELECT REM PT RETURN 9FT ADLT (ELECTROSURGICAL) ×2
ELECTRODE REM PT RTRN 9FT ADLT (ELECTROSURGICAL) ×1 IMPLANT
EVACUATOR 1/8 PVC DRAIN (DRAIN) IMPLANT
FACESHIELD LNG OPTICON STERILE (SAFETY) ×8 IMPLANT
GAUZE SPONGE 2X2 8PLY STRL LF (GAUZE/BANDAGES/DRESSINGS) ×1 IMPLANT
GLOVE BIOGEL PI IND STRL 7.5 (GLOVE) ×1 IMPLANT
GLOVE BIOGEL PI IND STRL 8 (GLOVE) ×1 IMPLANT
GLOVE BIOGEL PI INDICATOR 7.5 (GLOVE) ×1
GLOVE BIOGEL PI INDICATOR 8 (GLOVE) ×1
GLOVE ECLIPSE 8.0 STRL XLNG CF (GLOVE) ×2 IMPLANT
GLOVE ORTHO TXT STRL SZ7.5 (GLOVE) ×4 IMPLANT
GOWN BRE IMP PREV XXLGXLNG (GOWN DISPOSABLE) ×2 IMPLANT
GOWN STRL NON-REIN LRG LVL3 (GOWN DISPOSABLE) ×2 IMPLANT
KIT BASIN OR (CUSTOM PROCEDURE TRAY) ×2 IMPLANT
PACK TOTAL JOINT (CUSTOM PROCEDURE TRAY) ×2 IMPLANT
PADDING CAST COTTON 6X4 STRL (CAST SUPPLIES) ×2 IMPLANT
SPONGE GAUZE 2X2 STER 10/PKG (GAUZE/BANDAGES/DRESSINGS) ×1
SUCTION FRAZIER 12FR DISP (SUCTIONS) ×2 IMPLANT
SUT MNCRL AB 4-0 PS2 18 (SUTURE) ×2 IMPLANT
SUT VIC AB 1 CT1 36 (SUTURE) ×8 IMPLANT
SUT VIC AB 2-0 CT1 27 (SUTURE) ×2
SUT VIC AB 2-0 CT1 TAPERPNT 27 (SUTURE) ×2 IMPLANT
SUT VLOC 180 0 24IN GS25 (SUTURE) ×2 IMPLANT
TOWEL OR 17X26 10 PK STRL BLUE (TOWEL DISPOSABLE) ×4 IMPLANT
TRAY FOLEY CATH 14FRSI W/METER (CATHETERS) ×2 IMPLANT

## 2013-03-18 NOTE — Op Note (Signed)
NAME:  Cheryl Vargas                ACCOUNT NO.: [id number]      MEDICAL RECORD NO.: [id number]      FACILITY:  [facility]      PHYSICIAN:  Paralee Cancel D  DATE OF BIRTH:  [birth year]     DATE OF PROCEDURE:  03/18/2013                                 OPERATIVE REPORT         PREOPERATIVE DIAGNOSIS: Left femoral neck fracture.      POSTOPERATIVE DIAGNOSIS:  Left femoral neck fracture.      PROCEDURE:  Left total hip replacement through an anterior approach   utilizing DePuy THR system, component size 50mm pinnacle cup, a size 36+4 neutral   Altrex liner, a size 3 Hi Tri Lock stem with a 36+5 delta ceramic   ball.      SURGEON:  Pietro Cassis. Alvan Dame, M.D.      ASSISTANT:  Danae Orleans, PA-C     ANESTHESIA:  General.      SPECIMENS:  None.      COMPLICATIONS:  None.      BLOOD LOSS:  450 cc     DRAINS:  One Hemovac.      INDICATION OF THE PROCEDURE:  Cheryl Vargas is a 78 y.o. female who presented to her primary physician after 4 weeks or so of progressive hip pain.  Radiographs revealed a femoral neck fracure.  She was admitted through the ER for definitive management.  We were consulted for surgical intervention.  I reviewed all options available she wished to proceed with a total hip replacement.  Specific risks of each option were reviewed. Consent was obtained for   benefit of pain relief.  Specific risk of infection, DVT, component   failure, dislocation, need for revision surgery, as well discussion of   the anterior versus posterior approach were reviewed.  Consent was   obtained for benefit of pain relief through an anterior   approach.      PROCEDURE IN DETAIL:  The patient was brought to operative theater.   Once adequate anesthesia, preoperative antibiotics, 2gm of Ancef administered.   The patient was positioned supine on the OSI Hanna table.  Once adequate   padding of boney process was carried out, we had predraped out the hip, and  used  fluoroscopy to confirm orientation of the pelvis and position.      The left hip was then prepped and draped from proximal iliac crest to   mid thigh with shower curtain technique.      Time-out was performed identifying the patient, planned procedure, and   extremity.     An incision was then made 2 cm distal and lateral to the   anterior superior iliac spine extending over the orientation of the   tensor fascia lata muscle and sharp dissection was carried down to the   fascia of the muscle and protractor placed in the soft tissues.      The fascia was then incised.  The muscle belly was identified and swept   laterally and retractor placed along the superior neck.  Following   cauterization of the circumflex vessels and removing some pericapsular   fat, a second cobra retractor was placed on the inferior neck.  A third  retractor was placed on the anterior acetabulum after elevating the   anterior rectus.  A L-capsulotomy was along the line of the   superior neck to the trochanteric fossa, then extended proximally and   distally.  Tag sutures were placed and the retractors were then placed   intracapsular.  We then identified the trochanteric fossa and   orientation of my neck cut, confirmed this radiographically   and then made a neck osteotomy with the femur on traction.  The femoral   head was removed without difficulty or complication.  Traction was let   off and retractors were placed posterior and anterior around the   acetabulum.      The labrum and foveal tissue were debrided.  I began reaming with a 3mm   reamer and reamed up to 12mm reamer with good bony bed preparation and a 52   cup was chosen.  The final 7mm Pinnacle cup was then impacted under fluoroscopy  to confirm the depth of penetration and orientation with respect to   abduction.  A screw was placed followed by the hole eliminator.  The final   36+4 neutral Altrex liner was impacted with good visualized rim  fit.  The cup was positioned anatomically within the acetabular portion of the pelvis.      At this point, the femur was rolled at 80 degrees.  Further capsule was   released off the inferior aspect of the femoral neck.  I then   released the superior capsule proximally.  The hook was placed laterally   along the femur and elevated manually and held in position with the bed   hook.  The leg was then extended and adducted with the leg rolled to 100   degrees of external rotation.  Once the proximal femur was fully   exposed, I used a box osteotome to set orientation.  I then began   broaching with the starting chili pepper broach and passed this by hand and then broached up to 3.  With the 3 broach in place I chose a high offset neck and did a trial reduction first with the +1.5 then the +5 head ball.  With the +5 head ball the offset was appropriate, leg lengths   appeared to be equal, confirmed radiographically.   Given these findings, I went ahead and dislocated the hip, repositioned all   retractors and positioned the right hip in the extended and abducted position.  The final 3 Hi Tri Lock stem was   chosen and it was impacted down to the level of neck cut.  Based on this   and the trial reduction, a 36+5 delta ceramic ball was chosen and   impacted onto a clean and dry trunnion, and the hip was reduced.  The   hip had been irrigated throughout the case again at this point.  I did   reapproximate the superior capsular leaflet to the anterior leaflet   using #1 Vicryl, placed a medium Hemovac drain deep.  The fascia of the   tensor fascia lata muscle was then reapproximated using #1 Vicryl.  The   remaining wound was closed with 2-0 Vicryl and running 4-0 Monocryl.   The hip was cleaned, dried, and dressed sterilely using Dermabond and   Aquacel dressing.  Drain site dressed separately.  She was then brought   to recovery room in stable condition tolerating the procedure well.    Danae Orleans, PA-C was present for the entirety  of the case involved from   preoperative positioning, perioperative retractor management, general   facilitation of the case, as well as primary wound closure as assistant.            Pietro Cassis Alvan Dame, M.D.            MDO/MEDQ  D:  07/04/2011  T:  07/04/2011  Job:  [id number]      Electronically Signed by Paralee Cancel M.D. on 07/10/2011 09:15:38 AM

## 2013-03-18 NOTE — Anesthesia Postprocedure Evaluation (Signed)
  Anesthesia Post-op Note  Patient: Cheryl Vargas  Procedure(s) Performed: Procedure(s) (LRB): TOTAL HIP ARTHROPLASTY ANTERIOR APPROACH (Left)  Patient Location: PACU  Anesthesia Type: General  Level of Consciousness: awake and alert   Airway and Oxygen Therapy: Patient Spontanous Breathing  Post-op Pain: mild  Post-op Assessment: Post-op Vital signs reviewed, Patient's Cardiovascular Status Stable, Respiratory Function Stable, Patent Airway and No signs of Nausea or vomiting  Last Vitals:  Filed Vitals:   03/18/13 2215  BP: 128/104  Pulse: 89  Temp:   Resp: 11    Post-op Vital Signs: stable   Complications: No apparent anesthesia complications

## 2013-03-18 NOTE — Progress Notes (Signed)
Patient ID: Cheryl Vargas, female   DOB: 1935-01-01, 78 y.o.   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  Objective:   VITALS:   Filed Vitals:   03/18/13 0439  BP: 107/64  Pulse: 70  Temp: 97.8 F (36.6 C)  Resp: 18    Neurovascular intact left lower extrermity  LABS  Recent Labs  03/17/13 1400 03/18/13 0514  HGB 14.3 14.4  HCT 40.7 40.7  WBC 5.4 4.7  PLT 196 186     Recent Labs  03/17/13 1400 03/18/13 0514  NA 136 135  K 3.8 3.9  BUN 14 15  CREATININE 0.59 0.61  GLUCOSE 97 99     Recent Labs  03/17/13 1400  INR 0.98     Assessment/Plan:   Left femoral neck fracture   {Plan: After reviewing risks and benefits, pros and cons of all treatment options we are to proceed with left total hip replacement through anterior approach tonight NPO Cleared from medical and cardiology standpoint Consent on chart Ancef peri-operatively

## 2013-03-18 NOTE — Progress Notes (Signed)
Echocardiogram 2D Echocardiogram has been performed.  Cheryl Vargas 03/18/2013, 8:03 AM

## 2013-03-18 NOTE — Anesthesia Preprocedure Evaluation (Addendum)
Anesthesia Evaluation  Patient identified by MRN, date of birth, ID band Patient awake    Reviewed: Allergy & Precautions, H&P , NPO status , Patient's Chart, lab work & pertinent test results  Airway Mallampati: II TM Distance: >3 FB Neck ROM: Full    Dental no notable dental hx.    Pulmonary neg pulmonary ROS,  breath sounds clear to auscultation  Pulmonary exam normal       Cardiovascular +CHF Rhythm:Regular Rate:Normal + Systolic murmurs - Left ventricle: The cavity size was normal. Systolic   function was moderately to severely reduced. The estimated   ejection fraction was in the range of 30% to 35%. Wall   motion was normal; there were no regional wall motion   abnormalities. There was an increased relative   contribution of atrial contraction to ventricular filling.   Doppler parameters are consistent with abnormal left   ventricular relaxation (grade 1 diastolic dysfunction   Neuro/Psych negative neurological ROS  negative psych ROS   GI/Hepatic negative GI ROS, Neg liver ROS,   Endo/Other  negative endocrine ROS  Renal/GU negative Renal ROS  negative genitourinary   Musculoskeletal negative musculoskeletal ROS (+)   Abdominal   Peds negative pediatric ROS (+)  Hematology negative hematology ROS (+)   Anesthesia Other Findings   Reproductive/Obstetrics negative OB ROS                          Anesthesia Physical Anesthesia Plan  ASA: III  Anesthesia Plan: General   Post-op Pain Management:    Induction: Intravenous  Airway Management Planned: Oral ETT  Additional Equipment:   Intra-op Plan:   Post-operative Plan: Extubation in OR  Informed Consent: I have reviewed the patients History and Physical, chart, labs and discussed the procedure including the risks, benefits and alternatives for the proposed anesthesia with the patient or authorized representative who has  indicated his/her understanding and acceptance.   Dental advisory given  Plan Discussed with: CRNA and Surgeon  Anesthesia Plan Comments:         Anesthesia Quick Evaluation

## 2013-03-18 NOTE — Progress Notes (Signed)
Subjective: No new complaints  Objective: Vital signs in last 24 hours: Temp:  [97 F (36.1 C)-97.8 F (36.6 C)] 97.8 F (36.6 C) (07/08 0439) Pulse Rate:  [70-76] 70 (07/08 0439) Resp:  [18] 18 (07/08 0439) BP: (107-146)/(57-89) 107/64 mmHg (07/08 0439) SpO2:  [97 %-98 %] 97 % (07/08 0439) Weight:  [61.009 kg (134 lb 8 oz)] 61.009 kg (134 lb 8 oz) (07/07 2103) Weight change:  Last BM Date: 03/17/13  Intake/Output from previous day: 07/07 0701 - 07/08 0700 In: 480 [P.O.:480] Out: 550 [Urine:550] Intake/Output this shift:    General appearance: alert and cooperative Resp: clear to auscultation bilaterally Cardio: regular rate and rhythm, S1, S2 normal, no murmur, click, rub or gallop Extremities: extremities normal, atraumatic, no cyanosis or edema  Lab Results:  Recent Labs  03/17/13 1400 03/18/13 0514  WBC 5.4 4.7  HGB 14.3 14.4  HCT 40.7 40.7  PLT 196 186   BMET  Recent Labs  03/17/13 1400 03/18/13 0514  NA 136 135  K 3.8 3.9  CL 100 103  CO2 26 25  GLUCOSE 97 99  BUN 14 15  CREATININE 0.59 0.61  CALCIUM 9.4 9.2    Studies/Results: Dg Chest Port 1 View  03/17/2013   *RADIOLOGY REPORT*  Clinical Data: Preoperative respiratory evaluation for hip fracture.  PORTABLE CHEST - 1 VIEW  Comparison: None.  Findings: The cardiopericardial silhouette is enlarged.  No focal airspace consolidation or pulmonary edema. Imaged bony structures of the thorax are intact. Interstitial markings are diffusely coarsened with chronic features.  IMPRESSION: Cardiomegaly and mild interstitial coarsening without acute cardiopulmonary findings.   Original Report Authenticated By: Misty Stanley, M.D.    Medications: I have reviewed the patient's current medications.  Assessment/Plan: Principal Problem:   Closed left hip fracture, procedure per ortho today. Active Problems:   Cardiomegaly echo pending.  She had a normal cardiac cath in 2007.   PVC (premature ventricular  contraction)   Hyperlipidemia on simvastatin   LOS: 1 day   Cheryl Vargas JOSEPH 03/18/2013, 7:06 AM

## 2013-03-18 NOTE — Transfer of Care (Signed)
Immediate Anesthesia Transfer of Care Note  Patient: Cheryl Vargas  Procedure(s) Performed: Procedure(s): TOTAL HIP ARTHROPLASTY ANTERIOR APPROACH (Left)  Patient Location: PACU  Anesthesia Type:General  Level of Consciousness: awake, alert , oriented and patient cooperative  Airway & Oxygen Therapy: Patient Spontanous Breathing and Patient connected to face mask oxygen  Post-op Assessment: Patient moving all extremities X 4  Post vital signs: stable  Complications: No apparent anesthesia complications

## 2013-03-19 LAB — URINE CULTURE: Colony Count: >=100000

## 2013-03-19 LAB — BASIC METABOLIC PANEL
BUN: 13 mg/dL (ref 6–23)
CO2: 23 mEq/L (ref 19–32)
Calcium: 8.5 mg/dL (ref 8.4–10.5)
Chloride: 99 mEq/L (ref 96–112)
Creatinine, Ser: 0.53 mg/dL (ref 0.50–1.10)
GFR calc Af Amer: >90 mL/min (ref >90)
GFR calc non Af Amer: >90 mL/min (ref >90)
Glucose, Bld: 160 mg/dL — ABNORMAL HIGH (ref 70–99)
Potassium: 3.8 mEq/L (ref 3.5–5.1)
Sodium: 131 mEq/L — ABNORMAL LOW (ref 135–145)

## 2013-03-19 LAB — CBC
HCT: 35.8 % — ABNORMAL LOW (ref 36.0–46.0)
Hemoglobin: 12.6 g/dL (ref 12.0–15.0)
MCH: 31.3 pg (ref 26.0–34.0)
MCHC: 35.2 g/dL (ref 30.0–36.0)
MCV: 88.8 fL (ref 78.0–100.0)
Platelets: 165 10*3/uL (ref 150–400)
RBC: 4.03 MIL/uL (ref 3.87–5.11)
RDW: 12.1 % (ref 11.5–15.5)
WBC: 10.9 10*3/uL — ABNORMAL HIGH (ref 4.0–10.5)

## 2013-03-19 MED ORDER — FERROUS SULFATE 325 (65 FE) MG PO TABS: 325.0000 mg | ORAL_TABLET | Freq: Three times a day (TID) | ORAL | Status: DC

## 2013-03-19 MED ORDER — DSS 100 MG PO CAPS: 100.0000 mg | ORAL_CAPSULE | Freq: Two times a day (BID) | ORAL | Status: DC

## 2013-03-19 MED ORDER — POLYETHYLENE GLYCOL 3350 17 G PO PACK: 17.0000 g | PACK | Freq: Every day | ORAL | Status: DC | PRN

## 2013-03-19 MED ORDER — METHOCARBAMOL 500 MG PO TABS: 500.0000 mg | ORAL_TABLET | Freq: Four times a day (QID) | ORAL | Status: DC | PRN

## 2013-03-19 MED ORDER — ASPIRIN 325 MG PO TBEC: 325.0000 mg | DELAYED_RELEASE_TABLET | Freq: Two times a day (BID) | ORAL | Status: AC

## 2013-03-19 MED ORDER — HYDROCODONE-ACETAMINOPHEN 5-325 MG PO TABS: 1.0000 | ORAL_TABLET | ORAL | Status: DC | PRN

## 2013-03-19 NOTE — Progress Notes (Signed)
OT Cancellation Note  Patient Details Name: Cheryl Vargas MRN: [id number] DOB: 02-04-1935   Cancelled Treatment:    Reason Eval/Treat Not Completed: PT screened, no needs identified, will sign off.  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  She declines therapy services in acute.  Will sign off.   Makela Niehoff 03/19/2013, 2:34 PM Lesle Chris, OTR/L [phone] 03/19/2013

## 2013-03-19 NOTE — Progress Notes (Addendum)
PT NOTE  Order received. Chart reviewed. 2 attempts for PT evaluation/tx on today (in morning and afternoon). On both attempts pt declined to participate. However, both pt and nursing report that pt has been up and mobilizing with nursing staff/family(son). Pt's son is a PT. Spoke briefly with pt and she states she does not feel she needs PT/OT services in acute setting. Pt is set to d/c home on today. HH has been arranged. PT will sign off. Thanks.  Weston Anna, PT [phone]

## 2013-03-19 NOTE — Progress Notes (Signed)
   Subjective: 1 Day Post-Op Procedure(s) (LRB): TOTAL HIP ARTHROPLASTY ANTERIOR APPROACH (Left)   Patient reports pain as mild, pain well controlled. No events throughout the night. Ready to be discharged home if cleared by medicine.  Objective:   VITALS:   Filed Vitals:   03/19/13 0700  BP: 120/67  Pulse:   Temp:   Resp:     Neurovascular intact Dorsiflexion/Plantar flexion intact Incision: dressing C/D/I No cellulitis present Compartment soft  LABS  Recent Labs  03/17/13 1400 03/18/13 0514 03/19/13 0450  HGB 14.3 14.4 12.6  HCT 40.7 40.7 35.8*  WBC 5.4 4.7 10.9*  PLT 196 186 165     Recent Labs  03/17/13 1400 03/18/13 0514 03/19/13 0450  NA 136 135 131*  K 3.8 3.9 3.8  BUN 14 15 13   CREATININE 0.59 0.61 0.53  GLUCOSE 97 99 160*     Assessment/Plan: 1 Day Post-Op Procedure(s) (LRB): TOTAL HIP ARTHROPLASTY ANTERIOR APPROACH (Left)   Advance diet Up with therapy D/C IV fluids Discharge home with home health when ready with medicine Orthopaedically stable Aquacel dressing to remain in place until follow up in 2 weeks. ASA 325 mg bid for 4 weeks for anticoagulation, Rx written Norco for pain, Rx written WBAT left leg Follow up in 2 weeks at [facility]. Follow up with OLIN,Deneene Tarver D in 2 weeks.  Contact information:  [facility] [address] [place] Cheryl Vargas   PAC  03/19/2013, 10:24 AM

## 2013-03-19 NOTE — Progress Notes (Signed)
Spoke with pt and son at bedside concerning discharge needs and Home Health. Both pt and son selected [facility] for HHPT/HHNA/four wheel walker, referral given to in house rep.

## 2013-03-19 NOTE — Progress Notes (Signed)
Subjective: No complaints, no SOB  Objective: Vital signs in last 24 hours: Temp:  [97.8 F (36.6 C)-98.9 F (37.2 C)] 98.3 F (36.8 C) (07/09 0503) Pulse Rate:  [72-92] 92 (07/09 0503) Resp:  [10-18] 18 (07/09 0503) BP: (97-154)/(49-104) 97/49 mmHg (07/09 0503) SpO2:  [95 %-100 %] 100 % (07/09 0503) Weight change:  Last BM Date: 03/17/13  Intake/Output from previous day: 07/08 0701 - 07/09 0700 In: 3236.3 [I.V.:3236.3] Out: 1915 [Urine:1440; Blood:475] Intake/Output this shift: Total I/O In: 602.5 [I.V.:552.5; IV Piggyback:50] Out: -   General appearance: alert and cooperative Resp: clear to auscultation bilaterally Cardio: regular rate and rhythm, S1, S2 normal, no murmur, click, rub or gallop, no elevation JVP  Lab Results:  Recent Labs  03/18/13 0514 03/19/13 0450  WBC 4.7 10.9*  HGB 14.4 12.6  HCT 40.7 35.8*  PLT 186 165   BMET  Recent Labs  03/18/13 0514 03/19/13 0450  NA 135 131*  K 3.9 3.8  CL 103 99  CO2 25 23  GLUCOSE 99 160*  BUN 15 13  CREATININE 0.61 0.53  CALCIUM 9.2 8.5    Studies/Results: Dg Pelvis Portable  03/18/2013   *RADIOLOGY REPORT*  Clinical Data: Postop left total hip.  PORTABLE PELVIS  Comparison: None.  Findings: Changes of left total hip replacement.  Normal alignment. Soft tissue gas.  No hardware or bony complicating feature.  IMPRESSION: Left hip replacement.  No complicating feature.   Original Report Authenticated By: Rolm Baptise, M.D.   Dg Chest Port 1 View  03/17/2013   *RADIOLOGY REPORT*  Clinical Data: Preoperative respiratory evaluation for hip fracture.  PORTABLE CHEST - 1 VIEW  Comparison: None.  Findings: The cardiopericardial silhouette is enlarged.  No focal airspace consolidation or pulmonary edema. Imaged bony structures of the thorax are intact. Interstitial markings are diffusely coarsened with chronic features.  IMPRESSION: Cardiomegaly and mild interstitial coarsening without acute cardiopulmonary findings.    Original Report Authenticated By: Misty Stanley, M.D.   Dg C-arm 1-60 Min-no Report  03/18/2013   CLINICAL DATA: left total hip   C-ARM 1-60 MINUTES  Fluoroscopy was utilized by the requesting physician.  No radiographic  interpretation.     Medications: I have reviewed the patient's current medications.  Assessment/Plan: Principal Problem:  Closed left hip fracture, L hip total arthroplasty yesterday.  Active Problems:  Systolic Dysfunction, EF [id number] yesterday, no sign of CHF, will need to address as outpatient. D/C IVFs PVC (premature ventricular contraction)  Hyperlipidemia on simvastatin.  Disposition D/C when OK with orthopedics today or tomorrow    LOS: 2 days   Cheryl Vargas 03/19/2013, 7:49 AM

## 2013-03-20 ENCOUNTER — Encounter (HOSPITAL_COMMUNITY): Payer: Self-pay | Admitting: Orthopedic Surgery

## 2013-03-21 NOTE — Discharge Summary (Signed)
Physician Discharge Summary  Patient ID: Cheryl Vargas MRN: [id number] DOB/AGE: [birth year] 78 y.o.  Admit date: 03/17/2013 Discharge date: 03/21/2013  Admission Diagnoses: Left femoral neck hip fracture Cardiomegaly Hyperlipidemia  Discharge Diagnoses:  Principal Problem:   Left femoral neck hip fracture Active Problems:   LV dysfunction   Hyperlipidemia   Discharged Condition: good  Hospital Course: On June 2 the patient had fallen after twisting her foot in a car wash hose. For the next 4 weeks she had severe pain in the left hip with ambulation. The patient came to our office on the day of admission and had an x-ray showing a left femoral neck fracture. She was admitted to the hospital and underwent a left total hip replacement via anterior approach by Dr. Paralee Cancel on July 8. She did well without any complication. She did have cardiomegaly admission an echocardiogram was done and showed systolic function moderate to severely reduced with estimated ejection fraction 30-35% and normal wall motion and no regional wall motion abnormality, grade 1 diastolic dysfunction. She had no evidence of congestive heart failure during hospitalization and this will be adjusted as an outpatient  Consults: orthopedic surgery  Significant Diagnostic Studies: labs: WBC 10.9 hemoglobin 12.6 platelet 165, sodium 131 potassium 3.8 chloride 90 carbonate 23 glucose 160 BUN 13 creatinine 0.53 radiology: Chest x-ray cardiomegaly and mild interstitial coarsening and cardiac graphics: Echo as above   Treatments: surgery: Left anterior approach total hip arthroplasty   Discharge Exam: Blood pressure 120/67, pulse 92, temperature 98.3 F (36.8 C), temperature source Oral, resp. rate 18, height 5\' 6"  (1.676 m), weight 61.009 kg (134 lb 8 oz), SpO2 100.00%. General appearance: alert and cooperative Resp: clear to auscultation bilaterally Cardio: regular rate and rhythm, S1, S2 normal, no murmur, click, rub or  gallop  Disposition: 06-Home-Health Care Svc  Discharge Orders   Future Orders Complete By Expires     Call MD / Call 911  As directed     Comments:      If you experience chest pain or shortness of breath, CALL 911 and be transported to the hospital emergency room.  If you develope a fever above 101 F, pus (white drainage) or increased drainage or redness at the wound, or calf pain, call your surgeon's office.    Change dressing  As directed     Comments:      Maintain surgical dressing for 10-14 days, then replace with 4x4 guaze and tape. Keep the area dry and clean.    Constipation Prevention  As directed     Comments:      Drink plenty of fluids.  Prune juice may be helpful.  You may use a stool softener, such as Colace (over the counter) 100 mg twice a day.  Use MiraLax (over the counter) for constipation as needed.    Diet - low sodium heart healthy  As directed     Discharge instructions  As directed     Comments:      Maintain surgical dressing for 10-14 days, then replace with gauze and tape. Keep the area dry and clean until follow up. Follow up in 2 weeks at [facility]. Call with any questions or concerns.    Increase activity slowly as tolerated  As directed     TED hose  As directed     Comments:      Use stockings (TED hose) for 2 weeks on both leg(s).  You may remove them at night for sleeping.  Weight bearing as tolerated  As directed         Medication List    STOP taking these medications       naproxen sodium 220 MG tablet  Commonly known as:  ANAPROX      TAKE these medications       aspirin 325 MG EC tablet  Take 1 tablet (325 mg total) by mouth 2 (two) times daily.     DSS 100 MG Caps  Take 100 mg by mouth 2 (two) times daily.     ferrous sulfate 325 (65 FE) MG tablet  Take 1 tablet (325 mg total) by mouth 3 (three) times daily after meals.     HYDROcodone-acetaminophen 5-325 MG per tablet  Commonly known as:  NORCO/VICODIN  Take  1-2 tablets by mouth every 4 (four) hours as needed for pain.     methocarbamol 500 MG tablet  Commonly known as:  ROBAXIN  Take 1 tablet (500 mg total) by mouth every 6 (six) hours as needed (muscle spasms).     OMEGA 3 PO  Take 2 capsules by mouth 2 (two) times daily.     polyethylene glycol packet  Commonly known as:  MIRALAX / GLYCOLAX  Take 17 g by mouth daily as needed.     pseudoephedrine-acetaminophen 30-500 MG Tabs  Commonly known as:  TYLENOL SINUS  Take 81 tablets by mouth every 4 (four) hours as needed.     simvastatin 10 MG tablet  Commonly known as:  ZOCOR  Take 5 mg by mouth at bedtime.     zolpidem 5 MG tablet  Commonly known as:  AMBIEN  Take 2.5 mg by mouth at bedtime as needed for sleep.           Follow-up Information   Follow up with Mauri Pole, MD. Schedule an appointment as soon as possible for a visit in 2 weeks.   Contact information:   [address] [place] [address] [phone]       Follow up with Irven Shelling, MD In 4 weeks.   Contact information:   [address], [address], [place] [address] [phone]       Signed: Irven Shelling 03/21/2013, 7:59 AM

## 2013-04-16 ENCOUNTER — Ambulatory Visit: Payer: PRIVATE HEALTH INSURANCE | Attending: Orthopedic Surgery | Admitting: Physical Therapy

## 2013-04-16 DIAGNOSIS — IMO0001 Reserved for inherently not codable concepts without codable children: Secondary | ICD-10-CM | POA: Insufficient documentation

## 2013-04-16 DIAGNOSIS — R609 Edema, unspecified: Secondary | ICD-10-CM | POA: Insufficient documentation

## 2013-04-16 DIAGNOSIS — M25559 Pain in unspecified hip: Secondary | ICD-10-CM | POA: Insufficient documentation

## 2013-04-16 DIAGNOSIS — R269 Unspecified abnormalities of gait and mobility: Secondary | ICD-10-CM | POA: Insufficient documentation

## 2013-04-22 ENCOUNTER — Ambulatory Visit: Payer: PRIVATE HEALTH INSURANCE | Admitting: Physical Therapy

## 2013-04-24 ENCOUNTER — Ambulatory Visit: Payer: PRIVATE HEALTH INSURANCE | Admitting: Rehabilitation

## 2013-04-29 ENCOUNTER — Ambulatory Visit: Payer: PRIVATE HEALTH INSURANCE | Admitting: Physical Therapy

## 2013-04-30 ENCOUNTER — Ambulatory Visit: Payer: PRIVATE HEALTH INSURANCE | Admitting: Physical Therapy

## 2013-05-01 ENCOUNTER — Ambulatory Visit: Payer: PRIVATE HEALTH INSURANCE | Admitting: Physical Therapy

## 2013-05-06 ENCOUNTER — Ambulatory Visit: Payer: PRIVATE HEALTH INSURANCE | Admitting: Rehabilitation

## 2013-05-07 ENCOUNTER — Ambulatory Visit: Payer: PRIVATE HEALTH INSURANCE | Admitting: Physical Therapy

## 2013-05-08 ENCOUNTER — Ambulatory Visit: Payer: PRIVATE HEALTH INSURANCE | Admitting: Rehabilitation

## 2013-05-13 ENCOUNTER — Ambulatory Visit: Payer: PRIVATE HEALTH INSURANCE | Attending: Orthopedic Surgery | Admitting: Physical Therapy

## 2013-05-13 ENCOUNTER — Encounter: Payer: Medicare Other | Admitting: Physical Therapy

## 2013-05-13 DIAGNOSIS — M25559 Pain in unspecified hip: Secondary | ICD-10-CM | POA: Insufficient documentation

## 2013-05-13 DIAGNOSIS — R269 Unspecified abnormalities of gait and mobility: Secondary | ICD-10-CM | POA: Insufficient documentation

## 2013-05-13 DIAGNOSIS — R609 Edema, unspecified: Secondary | ICD-10-CM | POA: Insufficient documentation

## 2013-05-13 DIAGNOSIS — IMO0001 Reserved for inherently not codable concepts without codable children: Secondary | ICD-10-CM | POA: Insufficient documentation

## 2013-05-14 ENCOUNTER — Ambulatory Visit: Payer: PRIVATE HEALTH INSURANCE

## 2013-05-15 ENCOUNTER — Ambulatory Visit: Payer: PRIVATE HEALTH INSURANCE | Admitting: Physical Therapy

## 2013-05-19 ENCOUNTER — Encounter: Payer: Medicare Other | Admitting: Physical Therapy

## 2013-05-21 ENCOUNTER — Ambulatory Visit: Payer: PRIVATE HEALTH INSURANCE | Admitting: Physical Therapy

## 2013-05-26 ENCOUNTER — Ambulatory Visit: Payer: PRIVATE HEALTH INSURANCE | Admitting: Physical Therapy

## 2014-06-07 ENCOUNTER — Encounter: Payer: Self-pay | Admitting: *Deleted

## 2014-10-20 DIAGNOSIS — R0689 Other abnormalities of breathing: Secondary | ICD-10-CM | POA: Insufficient documentation

## 2014-10-20 DIAGNOSIS — I519 Heart disease, unspecified: Secondary | ICD-10-CM | POA: Insufficient documentation

## 2014-10-20 DIAGNOSIS — I499 Cardiac arrhythmia, unspecified: Secondary | ICD-10-CM | POA: Insufficient documentation

## 2014-10-20 DIAGNOSIS — R531 Weakness: Secondary | ICD-10-CM | POA: Insufficient documentation

## 2014-10-20 DIAGNOSIS — R5383 Other fatigue: Secondary | ICD-10-CM | POA: Insufficient documentation

## 2014-10-20 DIAGNOSIS — R0609 Other forms of dyspnea: Secondary | ICD-10-CM

## 2014-10-26 ENCOUNTER — Ambulatory Visit (HOSPITAL_COMMUNITY): Payer: Medicare Other

## 2015-09-16 ENCOUNTER — Other Ambulatory Visit: Payer: Self-pay | Admitting: Internal Medicine

## 2015-09-16 ENCOUNTER — Ambulatory Visit
Admission: RE | Admit: 2015-09-16 | Discharge: 2015-09-16 | Disposition: A | Discharge status: Home / Self Care | Payer: Medicare Other | Source: Ambulatory Visit | Attending: Internal Medicine | Admitting: Internal Medicine

## 2015-09-16 DIAGNOSIS — M25552 Pain in left hip: Secondary | ICD-10-CM

## 2015-10-18 ENCOUNTER — Emergency Department (HOSPITAL_COMMUNITY): Payer: Medicare Other

## 2015-10-18 ENCOUNTER — Emergency Department (HOSPITAL_COMMUNITY)
Admission: EM | Admit: 2015-10-18 | Discharge: 2015-10-18 | Disposition: A | Discharge status: Home / Self Care | Payer: Medicare Other | Attending: Emergency Medicine | Admitting: Emergency Medicine

## 2015-10-18 ENCOUNTER — Encounter (HOSPITAL_COMMUNITY): Payer: Self-pay

## 2015-10-18 DIAGNOSIS — Z79899 Other long term (current) drug therapy: Secondary | ICD-10-CM | POA: Insufficient documentation

## 2015-10-18 DIAGNOSIS — R000 Tachycardia, unspecified: Secondary | ICD-10-CM | POA: Diagnosis present

## 2015-10-18 DIAGNOSIS — N390 Urinary tract infection, site not specified: Secondary | ICD-10-CM | POA: Diagnosis not present

## 2015-10-18 DIAGNOSIS — I4891 Unspecified atrial fibrillation: Secondary | ICD-10-CM | POA: Diagnosis not present

## 2015-10-18 LAB — URINALYSIS, ROUTINE W REFLEX MICROSCOPIC
Bilirubin Urine: NEGATIVE
Glucose, UA: NEGATIVE mg/dL
Ketones, ur: NEGATIVE mg/dL
Nitrite: NEGATIVE
Protein, ur: NEGATIVE mg/dL
Specific Gravity, Urine: 1.012 (ref 1.005–1.030)
pH: 5.5 (ref 5.0–8.0)

## 2015-10-18 LAB — BASIC METABOLIC PANEL
Anion gap: 13 (ref 5–15)
BUN: 27 mg/dL — ABNORMAL HIGH (ref 6–20)
CO2: 22 mmol/L (ref 22–32)
Calcium: 9.1 mg/dL (ref 8.9–10.3)
Chloride: 98 mmol/L — ABNORMAL LOW (ref 101–111)
Creatinine, Ser: 1.24 mg/dL — ABNORMAL HIGH (ref 0.44–1.00)
GFR calc Af Amer: 47 mL/min — ABNORMAL LOW (ref >60)
GFR calc non Af Amer: 41 mL/min — ABNORMAL LOW (ref >60)
Glucose, Bld: 124 mg/dL — ABNORMAL HIGH (ref 65–99)
Potassium: 4 mmol/L (ref 3.5–5.1)
Sodium: 133 mmol/L — ABNORMAL LOW (ref 135–145)

## 2015-10-18 LAB — CBC
HCT: 41.9 % (ref 36.0–46.0)
Hemoglobin: 14.6 g/dL (ref 12.0–15.0)
MCH: 31.1 pg (ref 26.0–34.0)
MCHC: 34.8 g/dL (ref 30.0–36.0)
MCV: 89.3 fL (ref 78.0–100.0)
Platelets: 196 10*3/uL (ref 150–400)
RBC: 4.69 MIL/uL (ref 3.87–5.11)
RDW: 12.6 % (ref 11.5–15.5)
WBC: 6.3 10*3/uL (ref 4.0–10.5)

## 2015-10-18 LAB — URINE MICROSCOPIC-ADD ON

## 2015-10-18 LAB — TSH: TSH: 5.368 u[IU]/mL — ABNORMAL HIGH (ref 0.350–4.500)

## 2015-10-18 LAB — BRAIN NATRIURETIC PEPTIDE: B Natriuretic Peptide: 536.7 pg/mL — ABNORMAL HIGH (ref 0.0–100.0)

## 2015-10-18 LAB — I-STAT TROPONIN, ED: Troponin i, poc: 0.02 ng/mL (ref 0.00–0.08)

## 2015-10-18 LAB — T4, FREE: Free T4: 1 ng/dL (ref 0.61–1.12)

## 2015-10-18 MED ORDER — SODIUM CHLORIDE 0.9 % IV BOLUS (SEPSIS)
500.0000 mL | Freq: Once | INTRAVENOUS | Status: AC
  Administered 2015-10-18: 500 mL via INTRAVENOUS

## 2015-10-18 MED ORDER — CEPHALEXIN 250 MG PO CAPS: 250.0000 mg | ORAL_CAPSULE | Freq: Four times a day (QID) | ORAL | Status: DC

## 2015-10-18 MED ORDER — METOPROLOL TARTRATE 25 MG PO TABS: 12.5000 mg | ORAL_TABLET | Freq: Two times a day (BID) | ORAL | Status: DC

## 2015-10-18 NOTE — Discharge Instructions (Signed)
Atrial Fibrillation °Atrial fibrillation is a type of irregular or rapid heartbeat (arrhythmia). In atrial fibrillation, the heart quivers continuously in a chaotic pattern. This occurs when parts of the heart receive disorganized signals that make the heart unable to pump blood normally. This can increase the risk for stroke, heart failure, and other heart-related conditions. There are different types of atrial fibrillation, including: °· Paroxysmal atrial fibrillation. This type starts suddenly, and it usually stops on its own shortly after it starts. °· Persistent atrial fibrillation. This type often lasts longer than a week. It may stop on its own or with treatment. °· Long-lasting persistent atrial fibrillation. This type lasts longer than 12 months. °· Permanent atrial fibrillation. This type does not go away. °Talk with your health care provider to learn about the type of atrial fibrillation that you have. °CAUSES °This condition is caused by some heart-related conditions or procedures, including: °· A heart attack. °· Coronary artery disease. °· Heart failure. °· Heart valve conditions. °· High blood pressure. °· Inflammation of the sac that surrounds the heart (pericarditis). °· Heart surgery. °· Certain heart rhythm disorders, such as Wolf-Parkinson-White syndrome. °Other causes include: °· Pneumonia. °· Obstructive sleep apnea. °· Blockage of an artery in the lungs (pulmonary embolism, or PE). °· Lung cancer. °· Chronic lung disease. °· Thyroid problems, especially if the thyroid is overactive (hyperthyroidism). °· Caffeine. °· Excessive alcohol use or illegal drug use. °· Use of some medicines, including certain decongestants and diet pills. °Sometimes, the cause cannot be found. °RISK FACTORS °This condition is more likely to develop in: °· People who are older in age. °· People who smoke. °· People who have diabetes mellitus. °· People who are overweight (obese). °· Athletes who exercise  vigorously. °SYMPTOMS °Symptoms of this condition include: °· A feeling that your heart is beating rapidly or irregularly. °· A feeling of discomfort or pain in your chest. °· Shortness of breath. °· Sudden light-headedness or weakness. °· Getting tired easily during exercise. °In some cases, there are no symptoms. °DIAGNOSIS °Your health care provider may be able to detect atrial fibrillation when taking your pulse. If detected, this condition may be diagnosed with: °· An electrocardiogram (ECG). °· A Holter monitor test that records your heartbeat patterns over a 24-hour period. °· Transthoracic echocardiogram (TTE) to evaluate how blood flows through your heart. °· Transesophageal echocardiogram (TEE) to view more detailed images of your heart. °· A stress test. °· Imaging tests, such as a CT scan or chest X-ray. °· Blood tests. °TREATMENT °The main goals of treatment are to prevent blood clots from forming and to keep your heart beating at a normal rate and rhythm. The type of treatment that you receive depends on many factors, such as your underlying medical conditions and how you feel when you are experiencing atrial fibrillation. °This condition may be treated with: °· Medicine to slow down the heart rate, bring the heart's rhythm back to normal, or prevent clots from forming. °· Electrical cardioversion. This is a procedure that resets your heart's rhythm by delivering a controlled, low-energy shock to the heart through your skin. °· Different types of ablation, such as catheter ablation, catheter ablation with pacemaker, or surgical ablation. These procedures destroy the heart tissues that send abnormal signals. When the pacemaker is used, it is placed under your skin to help your heart beat in a regular rhythm. °HOME CARE INSTRUCTIONS °· Take over-the counter and prescription medicines only as told by your health care provider. °·   If your health care provider prescribed a blood-thinning medicine  (anticoagulant), take it exactly as told. Taking too much blood-thinning medicine can cause bleeding. If you do not take enough blood-thinning medicine, you will not have the protection that you need against stroke and other problems.  Do not use tobacco products, including cigarettes, chewing tobacco, and e-cigarettes. If you need help quitting, ask your health care provider.  If you have obstructive sleep apnea, manage your condition as told by your health care provider.  Do not drink alcohol.  Do not drink beverages that contain caffeine, such as coffee, soda, and tea.  Maintain a healthy weight. Do not use diet pills unless your health care provider approves. Diet pills may make heart problems worse.  Follow diet instructions as told by your health care provider.  Exercise regularly as told by your health care provider.  Keep all follow-up visits as told by your health care provider. This is important. PREVENTION  Avoid drinking beverages that contain caffeine or alcohol.  Avoid certain medicines, especially medicines that are used for breathing problems.  Avoid certain herbs and herbal medicines, such as those that contain ephedra or ginseng.  Do not use illegal drugs, such as cocaine and amphetamines.  Do not smoke.  Manage your high blood pressure. SEEK MEDICAL CARE IF:  You notice a change in the rate, rhythm, or strength of your heartbeat.  You are taking an anticoagulant and you notice increased bruising.  You tire more easily when you exercise or exert yourself. SEEK IMMEDIATE MEDICAL CARE IF:  You have chest pain, abdominal pain, sweating, or weakness.  You feel nauseous.  You notice blood in your vomit, bowel movement, or urine.  You have shortness of breath.  You suddenly have swollen feet and ankles.  You feel dizzy.  You have sudden weakness or numbness of the face, arm, or leg, especially on one side of the body.  You have trouble speaking,  trouble understanding, or both (aphasia).  Your face or your eyelid droops on one side. These symptoms may represent a serious problem that is an emergency. Do not wait to see if the symptoms will go away. Get medical help right away. Call your local emergency services (911 in the [place] Do not drive yourself to the hospital.   This information is not intended to replace advice given to you by your health care provider. Make sure you discuss any questions you have with your health care provider.   Document Released: 08/28/2005 Document Revised: 05/19/2015 Document Reviewed: 12/23/2014 Elsevier Interactive Patient Education 2016 [employer]   Urinary Tract Infection Urinary tract infections (UTIs) can develop anywhere along your urinary tract. Your urinary tract is your body's drainage system for removing wastes and extra water. Your urinary tract includes two kidneys, two ureters, a bladder, and a urethra. Your kidneys are a pair of bean-shaped organs. Each kidney is about the size of your fist. They are located below your ribs, one on each side of your spine. CAUSES Infections are caused by microbes, which are microscopic organisms, including fungi, viruses, and bacteria. These organisms are so small that they can only be seen through a microscope. Bacteria are the microbes that most commonly cause UTIs. SYMPTOMS  Symptoms of UTIs may vary by age and gender of the patient and by the location of the infection. Symptoms in young women typically include a frequent and intense urge to urinate and a painful, burning feeling in the bladder or urethra during urination. Older  women and men are more likely to be tired, shaky, and weak and have muscle aches and abdominal pain. A fever may mean the infection is in your kidneys. Other symptoms of a kidney infection include pain in your back or sides below the ribs, nausea, and vomiting. DIAGNOSIS To diagnose a UTI, your caregiver will ask you about your  symptoms. Your caregiver will also ask you to provide a urine sample. The urine sample will be tested for bacteria and white blood cells. White blood cells are made by your body to help fight infection. TREATMENT  Typically, UTIs can be treated with medication. Because most UTIs are caused by a bacterial infection, they usually can be treated with the use of antibiotics. The choice of antibiotic and length of treatment depend on your symptoms and the type of bacteria causing your infection. HOME CARE INSTRUCTIONS  If you were prescribed antibiotics, take them exactly as your caregiver instructs you. Finish the medication even if you feel better after you have only taken some of the medication.  Drink enough water and fluids to keep your urine clear or pale yellow.  Avoid caffeine, tea, and carbonated beverages. They tend to irritate your bladder.  Empty your bladder often. Avoid holding urine for long periods of time.  Empty your bladder before and after sexual intercourse.  After a bowel movement, women should cleanse from front to back. Use each tissue only once. SEEK MEDICAL CARE IF:   You have back pain.  You develop a fever.  Your symptoms do not begin to resolve within 3 days. SEEK IMMEDIATE MEDICAL CARE IF:   You have severe back pain or lower abdominal pain.  You develop chills.  You have nausea or vomiting.  You have continued burning or discomfort with urination. MAKE SURE YOU:   Understand these instructions.  Will watch your condition.  Will get help right away if you are not doing well or get worse.   This information is not intended to replace advice given to you by your health care provider. Make sure you discuss any questions you have with your health care provider.   Document Released: 06/07/2005 Document Revised: 05/19/2015 Document Reviewed: 10/06/2011 Elsevier Interactive Patient Education [employer].

## 2015-10-18 NOTE — ED Notes (Signed)
Pt here with c/o heart palpitations. She checked her BP today and it was 144/105 and HR was 155. HR in triage is 150, a-fib with RVR. Hx of heart failure, no hx of afib.

## 2015-10-18 NOTE — ED Provider Notes (Signed)
CSN: [id number]     Arrival date & time 10/18/15  1846 History   First MD Initiated Contact with Patient 10/18/15 1923     Chief Complaint  Patient presents with  . Tachycardia     (Consider location/radiation/quality/duration/timing/severity/associated sxs/prior Treatment) HPI Patient with palpitations starting around 5:00 today. Denied chest pain or shortness of breath. Describes palpitations as racing. She has a history of possible transient atrial fibrillation. She is on no blood thinners. Palpitations resolved shortly after arrival to the emergency department. She currently is asymptomatic. She denies any recent febrile illness. States she is drinking plenty of water. Denies any nausea, vomiting or diarrhea. No urinary symptoms. No history of thyroid disorder. Past Medical History  Diagnosis Date  . Enlarged heart   . Dysrhythmia     pvc's per pt  . Arthritis   . PONV (postoperative nausea and vomiting)    Past Surgical History  Procedure Laterality Date  . Tonsillectomy    . Tubal ligation    . Shoulder surgery      closed reduction  . Bunionectomy    . Cardiac catheterization      in 2004 at [facility]. "Insignificant blockage" per patient  . Total hip arthroplasty Left 03/18/2013    Procedure: TOTAL HIP ARTHROPLASTY ANTERIOR APPROACH;  Surgeon: Mauri Pole, MD;  Location: [facility];  Service: Orthopedics;  Laterality: Left;   Family History  Problem Relation Age of Onset  . Lung cancer Mother   . Heart attack Father    Social History  Substance Use Topics  . Smoking status: Never Smoker   . Smokeless tobacco: Never Used  . Alcohol Use: 1.8 oz/week    3 Glasses of wine per week     Comment: per week   OB History    No data available     Review of Systems  Constitutional: Negative for fever, chills and fatigue.  Respiratory: Negative for cough and shortness of breath.   Cardiovascular: Positive for palpitations. Negative for chest pain and leg swelling.   Gastrointestinal: Negative for nausea, vomiting, abdominal pain, diarrhea and constipation.  Genitourinary: Negative for dysuria, frequency, flank pain and difficulty urinating.  Musculoskeletal: Negative for back pain, neck pain and neck stiffness.  Skin: Negative for rash and wound.  Neurological: Negative for dizziness, weakness, light-headedness, numbness and headaches.  All other systems reviewed and are negative.     Allergies  Review of patient's allergies indicates no known allergies.  Home Medications   Prior to Admission medications   Medication Sig Start Date End Date Taking? Authorizing Provider  lisinopril (PRINIVIL,ZESTRIL) 5 MG tablet Take 5 mg by mouth daily.   Yes Historical Provider, MD  naproxen sodium (ANAPROX) 220 MG tablet Take 220 mg by mouth daily as needed. For ear ache per patient   Yes Historical Provider, MD  zolpidem (AMBIEN) 5 MG tablet Take 2.5 mg by mouth at bedtime as needed for sleep.   Yes Historical Provider, MD  cephALEXin (KEFLEX) 250 MG capsule Take 1 capsule (250 mg total) by mouth 4 (four) times daily. 10/18/15   Julianne Rice, MD  docusate sodium 100 MG CAPS Take 100 mg by mouth 2 (two) times daily. Patient not taking: Reported on 10/18/2015 03/19/13   Danae Orleans, PA-C  ferrous sulfate 325 (65 FE) MG tablet Take 1 tablet (325 mg total) by mouth 3 (three) times daily after meals. Patient not taking: Reported on 10/18/2015 03/19/13   Danae Orleans, PA-C  HYDROcodone-acetaminophen (NORCO/VICODIN) 5-325 MG per  tablet Take 1-2 tablets by mouth every 4 (four) hours as needed for pain. Patient not taking: Reported on 10/18/2015 03/19/13   Danae Orleans, PA-C  methocarbamol (ROBAXIN) 500 MG tablet Take 1 tablet (500 mg total) by mouth every 6 (six) hours as needed (muscle spasms). Patient not taking: Reported on 10/18/2015 03/19/13   Danae Orleans, PA-C  metoprolol (LOPRESSOR) 25 MG tablet Take 0.5 tablets (12.5 mg total) by mouth 2 (two) times daily. 10/18/15    Julianne Rice, MD  polyethylene glycol [facility] / Floria Raveling) packet Take 17 g by mouth daily as needed. Patient not taking: Reported on 10/18/2015 03/19/13   Danae Orleans, PA-C   BP 135/66 mmHg  Pulse 79  Temp(Src) 98.1 F (36.7 C) (Oral)  Resp 19  SpO2 98% Physical Exam  Constitutional: She is oriented to person, place, and time. She appears well-developed and well-nourished. No distress.  HENT:  Head: Normocephalic and atraumatic.  Mouth/Throat: Oropharynx is clear and moist. No oropharyngeal exudate.  Eyes: EOM are normal. Pupils are equal, round, and reactive to light.  Neck: Normal range of motion. Neck supple. No thyromegaly present.  Cardiovascular: Normal rate and regular rhythm.  Exam reveals no gallop and no friction rub.   No murmur heard. Pulmonary/Chest: Effort normal and breath sounds normal. No respiratory distress. She has no wheezes. She has no rales. She exhibits no tenderness.  Abdominal: Soft. Bowel sounds are normal. She exhibits no distension and no mass. There is no tenderness. There is no rebound and no guarding.  Musculoskeletal: Normal range of motion. She exhibits no edema or tenderness.  No lower extremity swelling or pain.  Neurological: She is alert and oriented to person, place, and time.  Moves all extremities without deficit. Sensation is fully intact.  Skin: Skin is warm and dry. No rash noted. No erythema.  Psychiatric: She has a normal mood and affect. Her behavior is normal.  Nursing note and vitals reviewed.   ED Course  Procedures (including critical care time) Labs Review Labs Reviewed  BASIC METABOLIC PANEL - Abnormal; Notable for the following:    Sodium 133 (*)    Chloride 98 (*)    Glucose, Bld 124 (*)    BUN 27 (*)    Creatinine, Ser 1.24 (*)    GFR calc non Af Amer 41 (*)    GFR calc Af Amer 47 (*)    All other components within normal limits  BRAIN NATRIURETIC PEPTIDE - Abnormal; Notable for the following:    B Natriuretic  Peptide 536.7 (*)    All other components within normal limits  URINALYSIS, ROUTINE W REFLEX MICROSCOPIC (NOT AT [facility]) - Abnormal; Notable for the following:    Color, Urine STRAW (*)    Hgb urine dipstick SMALL (*)    Leukocytes, UA TRACE (*)    All other components within normal limits  TSH - Abnormal; Notable for the following:    TSH 5.368 (*)    All other components within normal limits  URINE MICROSCOPIC-ADD ON - Abnormal; Notable for the following:    Squamous Epithelial / LPF 0-5 (*)    Bacteria, UA MANY (*)    All other components within normal limits  CBC  T4, FREE  I-STAT TROPOININ, ED    Imaging Review Dg Chest 2 View  10/18/2015  CLINICAL DATA:  Palpitations.  Elevated blood pressure. EXAM: CHEST  2 VIEW no osseous findings. COMPARISON:  03/17/2013. FINDINGS: Cardiac size upper limits normal. No infiltrates or failure. No  effusion or pneumothorax. Vascular calcification. No osseous findings. IMPRESSION: No active cardiopulmonary disease. Electronically Signed   By: Staci Righter M.D.   On: 10/18/2015 19:35   I have personally reviewed and evaluated these images and lab results as part of my medical decision-making.   EKG Interpretation   Date/Time:  Monday October 18 2015 18:51:12 EST Ventricular Rate:  149 PR Interval:    QRS Duration: 134 QT Interval:  304 QTC Calculation: 478 R Axis:   6 Text Interpretation:  Atrial fibrillation with rapid ventricular response  with premature ventricular or aberrantly conducted complexes Non-specific  intra-ventricular conduction block Abnormal ECG Confirmed by Amaru Burroughs   MD, Aishani Kalis ([id number]) on 10/18/2015 7:25:23 PM      MDM   Final diagnoses:  Atrial fibrillation with RVR (HCC)  UTI (lower urinary tract infection)    Patient with a trip for ablation and RPR on arrival. On his converted to normal sinus rhythm. Mild elevation in creatinine likely due to dehydration. We'll give IV fluids. Anticipate discharge home to  follow-up with her cardiologist.  Patient remained asymptomatic. Improved heart rate with IV fluids. Many bacteria in urine. Stress with cardiology. Advised starting the patient on metoprolol 12.5 mg twice a day and follow-up with the cardiologist to discuss possible anticoagulants. Offered starting patient on coagulants given her CHA2DS2VASc score of 5. Patient state she would like to defer until she sees the cardiologist. Since the need to return immediately for any further palpitations, chest pain, short of breath or any neurologic symptoms.  Julianne Rice, MD 10/18/15 [phone]

## 2015-10-19 NOTE — Progress Notes (Signed)
Electrophysiology Office Note   Date:  10/20/2015   ID:  Cheryl Vargas, DOB [birth year], MRN [id number]  PCP:  Cheryl Shelling, MD Primary Electrophysiologist:  Cheryl Haw, MD    Chief Complaint  Patient presents with  . Advice Only     History of Present Illness: Cheryl Vargas is a 81 y.o. female who presents today for electrophysiology evaluation.   She presented to the emergency room on 10/18/15 with palpitations. The palpitations and 5:00 the morning of presentation. She at that time she denied chest pain or shortness of breath. The palpitations felt as her heart was racing. The palpitations resolve shortly after presenting to the emergency room. She was asymptomatic at that time she had not had any recent illnesses and was drinking plenty of water. She had a urinalysis which showed that she had a UTI. She was started on 12.5 mg of metoprolol twice daily. Since discharge, she says that she is felt well without any issues.   Today, she denies symptoms of palpitations, chest pain, shortness of breath, orthopnea, PND, lower extremity edema, claudication, dizziness, presyncope, syncope, bleeding, or neurologic sequela. The patient is tolerating medications without difficulties and is otherwise without complaint today.    Past Medical History  Diagnosis Date  . Enlarged heart   . Dysrhythmia     pvc's per pt  . Arthritis   . PONV (postoperative nausea and vomiting)    Past Surgical History  Procedure Laterality Date  . Tonsillectomy    . Tubal ligation    . Shoulder surgery      closed reduction  . Bunionectomy    . Cardiac catheterization      in 2004 at [facility]. "Insignificant blockage" per patient  . Total hip arthroplasty Left 03/18/2013    Procedure: TOTAL HIP ARTHROPLASTY ANTERIOR APPROACH;  Surgeon: Cheryl Pole, MD;  Location: [facility];  Service: Orthopedics;  Laterality: Left;     Current Outpatient Prescriptions  Medication Sig Dispense Refill  .  cephALEXin (KEFLEX) 250 MG capsule Take 1 capsule (250 mg total) by mouth 4 (four) times daily. 28 capsule 0  . naproxen sodium (ANAPROX) 220 MG tablet Take 220 mg by mouth daily as needed. For ear ache per patient    . zolpidem (AMBIEN) 5 MG tablet Take 2.5 mg by mouth at bedtime as needed for sleep.    Marland Kitchen lisinopril (PRINIVIL,ZESTRIL) 5 MG tablet Take 1 tablet (5 mg total) by mouth daily. 90 tablet 1  . metoprolol succinate (TOPROL-XL) 25 MG 24 hr tablet Take 1 tablet (25 mg total) by mouth daily. Take with or immediately following a meal. 30 tablet 3  . Rivaroxaban (XARELTO) 15 MG TABS tablet Take 1 tablet (15 mg total) by mouth daily with supper. 30 tablet 0  . Rivaroxaban (XARELTO) 15 MG TABS tablet Take 1 tablet (15 mg total) by mouth daily with supper. 30 tablet 3   No current facility-administered medications for this visit.    Allergies:   Alendronate sodium   Social History:  The patient  reports that she has never smoked. She has never used smokeless tobacco. She reports that she drinks about 1.8 oz of alcohol per week. She reports that she does not use illicit drugs.   Family History:  The patient's family history includes Heart attack in her father; Lung cancer in her mother.    ROS:  Please see the history of present illness.   Otherwise, review of systems is positive for palpitations,  back pain.   All other systems are reviewed and negative.    PHYSICAL EXAM: VS:  BP 110/74 mmHg  Pulse 60  Ht 5' 5.5" (1.664 m)  Wt 149 lb (67.586 kg)  BMI 24.41 kg/m2 , BMI Body mass index is 24.41 kg/(m^2). GEN: Well nourished, well developed, in no acute distress HEENT: normal Neck: no JVD, carotid bruits, or masses Cardiac: RRR; no murmurs, rubs, or gallops,no edema  Respiratory:  clear to auscultation bilaterally, normal work of breathing GI: soft, nontender, nondistended, + BS MS: no deformity or atrophy Skin: warm and dry Neuro:  Strength and sensation are intact Psych: euthymic  mood, full affect  EKG:  EKG is not ordered today. The ekg ordered 2/6 shows atrial fibrillation with a heart rate of 149  Recent Labs: 10/18/2015: B Natriuretic Peptide 536.7*; BUN 27*; Creatinine, Ser 1.24*; Hemoglobin 14.6; Platelets 196; Potassium 4.0; Sodium 133*; TSH 5.368*    Lipid Panel  No results found for: CHOL, TRIG, HDL, CHOLHDL, VLDL, LDLCALC, LDLDIRECT   Wt Readings from Last 3 Encounters:  10/20/15 149 lb (67.586 kg)  03/17/13 134 lb 8 oz (61.009 kg)      Other studies Reviewed: Additional studies/ records that were reviewed today include: TTE 2014  Review of the above records today demonstrates:  - Left ventricle: The cavity size was normal. Systolic function was moderately to severely reduced. The estimated ejection fraction was in the range of 30% to 35%. Wall motion was normal; there were no regional wall motion abnormalities. There was an increased relative contribution of atrial contraction to ventricular filling. Doppler parameters are consistent with abnormal left ventricular relaxation (grade 1 diastolic dysfunction). - Aortic valve: Mild regurgitation. - Mitral valve: Mild regurgitation. - Pulmonary arteries: PA peak pressure: 40mm Hg (S).   ASSESSMENT AND PLAN:  1.  Paroxysmal atrial fibrillation: Has CHADS2VASc of 5. I have discussed with her the options of anticoagulation. Discussed the risks and benefits of both Coumadin and NOACs. At this point she feels like she would benefit most from [place]. I've also discussed with her the options of rhythm versus rate control. She is only had one episode of atrial fibrillation, and therefore we Amelya Mabry choose a rate control strategy. Should she have more atrial fibrillation we Evey Mcmahan consider rhythm control that time. I Sameer Teeple also order an echo cardiogram to determine her LV function. She does need to be on an ACE inhibitor and a beta blocker, we Finnley Lewis change her metoprolol to Toprol-XL 25 mg and restart  her lisinopril.  Current medicines are reviewed at length with the patient today.   The patient does not have concerns regarding her medicines.  The following changes were made today:  Toprol XL 25 mg daily Xarelto  Labs/ tests ordered today include:  Orders Placed This Encounter  Procedures  . ECHOCARDIOGRAM COMPLETE   Disposition:   FU with Gisell Buehrle 3 months  Signed, Mikhia Dusek Meredith Leeds, MD  10/20/2015 12:14 PM     [place] [address] [phone] (office) [phone] (fax)

## 2015-10-20 ENCOUNTER — Ambulatory Visit (INDEPENDENT_AMBULATORY_CARE_PROVIDER_SITE_OTHER): Payer: Medicare Other | Admitting: Cardiology

## 2015-10-20 ENCOUNTER — Encounter: Payer: Self-pay | Admitting: Cardiology

## 2015-10-20 VITALS — BP 110/74 | HR 60 | Ht 65.5 in | Wt 149.0 lb

## 2015-10-20 DIAGNOSIS — I480 Paroxysmal atrial fibrillation: Secondary | ICD-10-CM | POA: Diagnosis not present

## 2015-10-20 MED ORDER — METOPROLOL SUCCINATE ER 25 MG PO TB24: 25.0000 mg | ORAL_TABLET | Freq: Every day | ORAL | Status: DC

## 2015-10-20 MED ORDER — RIVAROXABAN 15 MG PO TABS: 15.0000 mg | ORAL_TABLET | Freq: Every day | ORAL | Status: DC

## 2015-10-20 MED ORDER — LISINOPRIL 5 MG PO TABS: 5.0000 mg | ORAL_TABLET | Freq: Every day | ORAL | Status: DC

## 2015-10-20 NOTE — Patient Instructions (Addendum)
Medication Instructions:  Your physician has recommended you make the following change in your medication: 1) STOP Metoprolol Tartrate  2) START Metoprolol Succinate or Toprol XL 25 mg daily 3) START Xarelto 15 mg daily 4) RESTART Lisinopril 5 mg daily  Labwork: None ordered  Testing/Procedures: Your physician has requested that you have an echocardiogram. Echocardiography is a painless test that uses sound waves to create images of your heart. It provides your doctor with information about the size and shape of your heart and how well your heart's chambers and valves are working. This procedure takes approximately one hour. There are no restrictions for this procedure.  Follow-Up: Your physician recommends that you schedule a follow-up appointment in: 3 months with Dr. Curt Bears.   If you need a refill on your cardiac medications before your next appointment, please call your pharmacy.  Thank you for choosing [facility]!!   Trinidad Curet, RN [phone]    Rivaroxaban oral tablets What is this medicine? RIVAROXABAN (ri va ROX a ban) is an anticoagulant (blood thinner). It is used to treat blood clots in the lungs or in the veins. It is also used after knee or hip surgeries to prevent blood clots. It is also used to lower the chance of stroke in people with a medical condition called atrial fibrillation. This medicine may be used for other purposes; ask your health care provider or pharmacist if you have questions. What should I tell my health care provider before I take this medicine? They need to know if you have any of these conditions: -bleeding disorders -bleeding in the brain -blood in your stools (black or tarry stools) or if you have blood in your vomit -history of stomach bleeding -kidney disease -liver disease -low blood counts, like low white cell, platelet, or red cell counts -recent or planned spinal or epidural procedure -take medicines that treat or prevent  blood clots -an unusual or allergic reaction to rivaroxaban, other medicines, foods, dyes, or preservatives -pregnant or trying to get pregnant -breast-feeding How should I use this medicine? Take this medicine by mouth with a glass of water. Follow the directions on the prescription label. Take your medicine at regular intervals. Do not take it more often than directed. Do not stop taking except on your doctor's advice. Stopping this medicine may increase your risk of a blood clot. Be sure to refill your prescription before you run out of medicine. If you are taking this medicine after hip or knee replacement surgery, take it with or without food. If you are taking this medicine for atrial fibrillation, take it with your evening meal. If you are taking this medicine to treat blood clots, take it with food at the same time each day. If you are unable to swallow your tablet, you may crush the tablet and mix it in applesauce. Then, immediately eat the applesauce. You should eat more food right after you eat the applesauce containing the crushed tablet. Talk to your pediatrician regarding the use of this medicine in children. Special care may be needed. Overdosage: If you think you have taken too much of this medicine contact a poison control center or emergency room at once. NOTE: This medicine is only for you. Do not share this medicine with others. What if I miss a dose? If you take your medicine once a day and miss a dose, take the missed dose as soon as you remember. If you take your medicine twice a day and miss a dose, take  the missed dose immediately. In this instance, 2 tablets may be taken at the same time. The next day you should take 1 tablet twice a day as directed. What may interact with this medicine? -aspirin and aspirin-like medicines -certain antibiotics like erythromycin, azithromycin, and clarithromycin -certain medicines for fungal infections like ketoconazole and  itraconazole -certain medicines for irregular heart beat like amiodarone, quinidine, dronedarone -certain medicines for seizures like carbamazepine, phenytoin -certain medicines that treat or prevent blood clots like warfarin, enoxaparin, and dalteparin -conivaptan -diltiazem -felodipine -indinavir -lopinavir; ritonavir -NSAIDS, medicines for pain and inflammation, like ibuprofen or naproxen -ranolazine -rifampin -ritonavir -St. John's wort -verapamil This list may not describe all possible interactions. Give your health care provider a list of all the medicines, herbs, non-prescription drugs, or dietary supplements you use. Also tell them if you smoke, drink alcohol, or use illegal drugs. Some items may interact with your medicine. What should I watch for while using this medicine? Visit your doctor or health care professional for regular checks on your progress. Your condition will be monitored carefully while you are receiving this medicine. Notify your doctor or health care professional and seek emergency treatment if you develop breathing problems; changes in vision; chest pain; severe, sudden headache; pain, swelling, warmth in the leg; trouble speaking; sudden numbness or weakness of the face, arm, or leg. These can be signs that your condition has gotten worse. If you are going to have surgery, tell your doctor or health care professional that you are taking this medicine. Tell your health care professional that you use this medicine before you have a spinal or epidural procedure. Sometimes people who take this medicine have bleeding problems around the spine when they have a spinal or epidural procedure. This bleeding is very rare. If you have a spinal or epidural procedure while on this medicine, call your health care professional immediately if you have back pain, numbness or tingling (especially in your legs and feet), muscle weakness, paralysis, or loss of bladder or bowel  control. Avoid sports and activities that might cause injury while you are using this medicine. Severe falls or injuries can cause unseen bleeding. Be careful when using sharp tools or knives. Consider using an Copy. Take special care brushing or flossing your teeth. Report any injuries, bruising, or red spots on the skin to your doctor or health care professional. What side effects may I notice from receiving this medicine? Side effects that you should report to your doctor or health care professional as soon as possible: -allergic reactions like skin rash, itching or hives, swelling of the face, lips, or tongue -back pain -redness, blistering, peeling or loosening of the skin, including inside the mouth -signs and symptoms of bleeding such as bloody or black, tarry stools; red or dark-brown urine; spitting up blood or brown material that looks like coffee grounds; red spots on the skin; unusual bruising or bleeding from the eye, gums, or nose Side effects that usually do not require medical attention (Report these to your doctor or health care professional if they continue or are bothersome.): -dizziness -muscle pain This list may not describe all possible side effects. Call your doctor for medical advice about side effects. You may report side effects to FDA at 1-800-FDA-1088. Where should I keep my medicine? Keep out of the reach of children. Store at room temperature between 15 and 30 degrees C (59 and 86 degrees F). Throw away any unused medicine after the expiration date. NOTE: This sheet is  a summary. It may not cover all possible information. If you have questions about this medicine, talk to your doctor, pharmacist, or health care provider.    2016, [employer]vier/[employer]. (2014-08-26 12:45:34)

## 2015-10-22 ENCOUNTER — Telehealth: Payer: Self-pay | Admitting: Cardiology

## 2015-10-22 NOTE — Telephone Encounter (Signed)
Patient called to discuss travel/exercise limitations. Instructed patient to take it easy at least until ECHO is complete and Dr. Curt Bears will have more to evaluate, especially since she st her episode was instigated by carrying an extremely heavy flower pot.  Patient also st her Xarelto is too expensive. Informed her the PA nurse will try a tier exception to try to decrease pricing. Patient agrees with treatment plan.

## 2015-10-22 NOTE — Telephone Encounter (Signed)
New message      Talk to the nurse.  Pt has several questions regarding her diagnosis and her limitations

## 2015-10-27 NOTE — Telephone Encounter (Signed)
Spoke with Vaughan Basta about prior auth/tier exception. States she is currently working on this and will call patient.

## 2015-11-01 ENCOUNTER — Telehealth: Payer: Self-pay | Admitting: Cardiology

## 2015-11-01 DIAGNOSIS — I480 Paroxysmal atrial fibrillation: Secondary | ICD-10-CM

## 2015-11-01 MED ORDER — METOPROLOL SUCCINATE ER 25 MG PO TB24: 12.5000 mg | ORAL_TABLET | Freq: Every day | ORAL | Status: DC

## 2015-11-01 NOTE — Telephone Encounter (Signed)
Explains that since last week she has been fatigued around mid-day.  States she cannot do anything but lay down she is so tired. Reports BPs going as low as 80/40. This morning BP was 104/51 and HR was 52. Patient has echo test tomorrow. Reviewed with Camnitz - orders to decrease Toprol to 12.5 mg daily and EKG tomorrow while she is here for echo, to determine heart rhythm. (he also suggested decreasing Lisinopril - but patient would like to hold off and see what happens with decreasing Toprol first) Patient is agreeable to above plan and will call me if decrease in medication does not make improvement in BP/HR.

## 2015-11-01 NOTE — Telephone Encounter (Signed)
Pt blood pressure is really too low and pulse rate is too low,thinks it might be Metoprolol.

## 2015-11-02 ENCOUNTER — Encounter: Payer: Self-pay | Admitting: Cardiology

## 2015-11-02 ENCOUNTER — Ambulatory Visit (HOSPITAL_COMMUNITY): Payer: Medicare Other | Attending: Cardiology

## 2015-11-02 ENCOUNTER — Other Ambulatory Visit: Payer: Self-pay

## 2015-11-02 ENCOUNTER — Encounter (INDEPENDENT_AMBULATORY_CARE_PROVIDER_SITE_OTHER): Payer: Medicare Other

## 2015-11-02 DIAGNOSIS — I4891 Unspecified atrial fibrillation: Secondary | ICD-10-CM

## 2015-11-02 DIAGNOSIS — I351 Nonrheumatic aortic (valve) insufficiency: Secondary | ICD-10-CM | POA: Diagnosis not present

## 2015-11-02 DIAGNOSIS — I517 Cardiomegaly: Secondary | ICD-10-CM | POA: Diagnosis not present

## 2015-11-02 DIAGNOSIS — Z8249 Family history of ischemic heart disease and other diseases of the circulatory system: Secondary | ICD-10-CM | POA: Diagnosis not present

## 2015-11-02 DIAGNOSIS — I340 Nonrheumatic mitral (valve) insufficiency: Secondary | ICD-10-CM | POA: Diagnosis not present

## 2015-11-02 DIAGNOSIS — I480 Paroxysmal atrial fibrillation: Secondary | ICD-10-CM | POA: Diagnosis not present

## 2015-11-02 DIAGNOSIS — I5189 Other ill-defined heart diseases: Secondary | ICD-10-CM | POA: Diagnosis not present

## 2015-11-02 DIAGNOSIS — R29898 Other symptoms and signs involving the musculoskeletal system: Secondary | ICD-10-CM | POA: Insufficient documentation

## 2015-11-02 DIAGNOSIS — I071 Rheumatic tricuspid insufficiency: Secondary | ICD-10-CM | POA: Insufficient documentation

## 2015-11-02 NOTE — Telephone Encounter (Signed)
I have done a Tier exception for Xarelto 15mg , sent to [facility].

## 2015-11-02 NOTE — Telephone Encounter (Signed)
Performed EKG on patient today, after she completed her echo. EKG showing NSR, HR 70. Patient states that she feels much better this morning after taking decreased dose of Toprol. She understands that I will call with preliminary results of echo if they are available this afternoon. Will review EKG with Camnitz on Friday, when he returns to office. Will also check in with patient at that time to see how she is doing after several days on decreased Toprol dosage. Patient very appreciative of help.

## 2015-11-04 ENCOUNTER — Encounter: Payer: Self-pay | Admitting: Cardiology

## 2015-11-04 NOTE — Telephone Encounter (Signed)
Returning your call. °

## 2015-11-04 NOTE — Telephone Encounter (Signed)
This encounter was created in error - please disregard.

## 2015-11-08 ENCOUNTER — Telehealth: Payer: Self-pay | Admitting: *Deleted

## 2015-11-08 NOTE — Telephone Encounter (Signed)
lmtcb to discuss our conversation last week.  Patient was still complaining of being tired/fatigue.  Camnitz recommended trying a different beta blocker - but patient would like to wait and see how she feels over the weekend, thinking all of this is related to UTI. We agreed to speak today to see how weekend went.

## 2015-11-08 NOTE — Telephone Encounter (Signed)
Patient would like to hold off until tomorrow/next day before making decision on switching medications. States she is still experiencing fatigue. Morning BP 125/67, HR 65. Lunch time BP 106/52, HR 52 Patient will call me back with a decision.

## 2015-11-09 ENCOUNTER — Telehealth: Payer: Self-pay | Admitting: Cardiology

## 2015-11-09 MED ORDER — CARVEDILOL 3.125 MG PO TABS: 3.1250 mg | ORAL_TABLET | Freq: Two times a day (BID) | ORAL | Status: DC

## 2015-11-09 NOTE — Telephone Encounter (Signed)
Patient tells me that she is feeling terrible and ready to try another medication. Reports BPs 88/48,  90/52. Stopping Metoprolol and changing to Carvedilol 3.125 mg BID. Rx sent to [facility]/[address]. Informed patient that I would check on her next week to see how she is doing with change.  Patient will call back before then if she has issues.

## 2015-11-09 NOTE — Telephone Encounter (Signed)
Please call,concerning changing some medication.

## 2015-11-12 ENCOUNTER — Telehealth: Payer: Self-pay | Admitting: Cardiology

## 2015-11-12 NOTE — Telephone Encounter (Signed)
Returning a call from yesterday. °

## 2015-11-12 NOTE — Telephone Encounter (Signed)
The pt states that she thinks Sherri, Dr [employer] nurse, called her yesterday and she was returning her call. She is requesting a call back from [facility] as she wants to talk to her concerning her medications.

## 2015-11-18 NOTE — Telephone Encounter (Signed)
Patient tells me that she is doing better.  States that yesterday and the day before were "pretty good days". She will continue current medication regimen and call office if symptoms/problems arise.

## 2015-11-19 ENCOUNTER — Telehealth: Payer: Self-pay | Admitting: Cardiology

## 2015-11-19 NOTE — Telephone Encounter (Signed)
Reports BP 144/96, HR 85. Discussed that headache was probably causing BP to raise slightly. Advised to wait 1 hour after taking the Lisinopril before taking another BP. Advised to take Tylenol for HA. Patient will call back/go to ED if symptoms worsen.

## 2015-11-19 NOTE — Telephone Encounter (Signed)
New message      Pt c/o BP issue: STAT if pt c/o blurred vision, one-sided weakness or slurred speech  1. What are your last 5 BP readings? 139-104 HR 73, 144/96 HR 85 2. Are you having any other symptoms (ex. Dizziness, headache, blurred vision, passed out)? headache 3. What is your BP issue?  Pt was having a headache, checked her bp and it was high.  Pt took 5mg  of lisinopril 3 minutes ago. Please advise

## 2015-12-01 ENCOUNTER — Observation Stay (HOSPITAL_COMMUNITY): Payer: Medicare Other

## 2015-12-01 ENCOUNTER — Observation Stay (HOSPITAL_COMMUNITY)
Admission: EM | Admit: 2015-12-01 | Discharge: 2015-12-02 | Disposition: A | Discharge status: Home / Self Care | Payer: Medicare Other | Attending: Internal Medicine | Admitting: Internal Medicine

## 2015-12-01 ENCOUNTER — Encounter (HOSPITAL_COMMUNITY): Payer: Self-pay

## 2015-12-01 DIAGNOSIS — Z79899 Other long term (current) drug therapy: Secondary | ICD-10-CM | POA: Insufficient documentation

## 2015-12-01 DIAGNOSIS — I517 Cardiomegaly: Secondary | ICD-10-CM | POA: Insufficient documentation

## 2015-12-01 DIAGNOSIS — Z792 Long term (current) use of antibiotics: Secondary | ICD-10-CM | POA: Diagnosis not present

## 2015-12-01 DIAGNOSIS — R110 Nausea: Secondary | ICD-10-CM | POA: Insufficient documentation

## 2015-12-01 DIAGNOSIS — I480 Paroxysmal atrial fibrillation: Secondary | ICD-10-CM | POA: Diagnosis not present

## 2015-12-01 DIAGNOSIS — R42 Dizziness and giddiness: Principal | ICD-10-CM

## 2015-12-01 DIAGNOSIS — R1013 Epigastric pain: Secondary | ICD-10-CM | POA: Insufficient documentation

## 2015-12-01 DIAGNOSIS — I499 Cardiac arrhythmia, unspecified: Secondary | ICD-10-CM | POA: Diagnosis not present

## 2015-12-01 DIAGNOSIS — G4700 Insomnia, unspecified: Secondary | ICD-10-CM

## 2015-12-01 DIAGNOSIS — M1990 Unspecified osteoarthritis, unspecified site: Secondary | ICD-10-CM | POA: Diagnosis not present

## 2015-12-01 DIAGNOSIS — Z9889 Other specified postprocedural states: Secondary | ICD-10-CM | POA: Insufficient documentation

## 2015-12-01 DIAGNOSIS — Z7901 Long term (current) use of anticoagulants: Secondary | ICD-10-CM | POA: Diagnosis not present

## 2015-12-01 DIAGNOSIS — I5043 Acute on chronic combined systolic (congestive) and diastolic (congestive) heart failure: Secondary | ICD-10-CM

## 2015-12-01 DIAGNOSIS — I10 Essential (primary) hypertension: Secondary | ICD-10-CM

## 2015-12-01 DIAGNOSIS — I5042 Chronic combined systolic (congestive) and diastolic (congestive) heart failure: Secondary | ICD-10-CM | POA: Diagnosis not present

## 2015-12-01 LAB — BASIC METABOLIC PANEL
Anion gap: 10 (ref 5–15)
BUN: 9 mg/dL (ref 6–20)
CO2: 22 mmol/L (ref 22–32)
Calcium: 9.5 mg/dL (ref 8.9–10.3)
Chloride: 106 mmol/L (ref 101–111)
Creatinine, Ser: 0.77 mg/dL (ref 0.44–1.00)
GFR calc Af Amer: >60 mL/min (ref >60)
GFR calc non Af Amer: >60 mL/min (ref >60)
Glucose, Bld: 129 mg/dL — ABNORMAL HIGH (ref 65–99)
Potassium: 4.6 mmol/L (ref 3.5–5.1)
Sodium: 138 mmol/L (ref 135–145)

## 2015-12-01 LAB — CBC
HCT: 40.5 % (ref 36.0–46.0)
Hemoglobin: 14.5 g/dL (ref 12.0–15.0)
MCH: 31.7 pg (ref 26.0–34.0)
MCHC: 35.8 g/dL (ref 30.0–36.0)
MCV: 88.6 fL (ref 78.0–100.0)
Platelets: 182 10*3/uL (ref 150–400)
RBC: 4.57 MIL/uL (ref 3.87–5.11)
RDW: 12.4 % (ref 11.5–15.5)
WBC: 6.6 10*3/uL (ref 4.0–10.5)

## 2015-12-01 LAB — I-STAT TROPONIN, ED
Troponin i, poc: 0.02 ng/mL (ref 0.00–0.08)
Troponin i, poc: 0.04 ng/mL (ref 0.00–0.08)

## 2015-12-01 MED ORDER — ONDANSETRON HCL 4 MG PO TABS: 4.0000 mg | ORAL_TABLET | Freq: Four times a day (QID) | ORAL | Status: DC | PRN

## 2015-12-01 MED ORDER — SODIUM CHLORIDE 0.9% FLUSH
3.0000 mL | Freq: Two times a day (BID) | INTRAVENOUS | Status: DC
  Administered 2015-12-01: 3 mL via INTRAVENOUS

## 2015-12-01 MED ORDER — SODIUM CHLORIDE 0.45 % IV SOLN
INTRAVENOUS | Status: DC
  Administered 2015-12-01: 21:00:00 via INTRAVENOUS

## 2015-12-01 MED ORDER — ACETAMINOPHEN 325 MG PO TABS: 650.0000 mg | ORAL_TABLET | Freq: Four times a day (QID) | ORAL | Status: DC | PRN

## 2015-12-01 MED ORDER — LISINOPRIL 2.5 MG PO TABS
2.5000 mg | ORAL_TABLET | Freq: Every day | ORAL | Status: DC
  Administered 2015-12-01: 2.5 mg via ORAL
  Filled 2015-12-01 (×3): qty 1

## 2015-12-01 MED ORDER — MECLIZINE HCL 25 MG PO TABS
25.0000 mg | ORAL_TABLET | Freq: Once | ORAL | Status: AC
  Administered 2015-12-01: 25 mg via ORAL
  Filled 2015-12-01: qty 1

## 2015-12-01 MED ORDER — ONDANSETRON HCL 4 MG/2ML IJ SOLN: 4.0000 mg | Freq: Four times a day (QID) | INTRAMUSCULAR | Status: DC | PRN

## 2015-12-01 MED ORDER — SODIUM CHLORIDE 0.9 % IV BOLUS (SEPSIS)
1000.0000 mL | Freq: Once | INTRAVENOUS | Status: AC
  Administered 2015-12-01: 1000 mL via INTRAVENOUS

## 2015-12-01 MED ORDER — DILTIAZEM HCL 25 MG/5ML IV SOLN
5.0000 mg | Freq: Four times a day (QID) | INTRAVENOUS | Status: DC | PRN
  Filled 2015-12-01: qty 5

## 2015-12-01 MED ORDER — MECLIZINE HCL 25 MG PO TABS: 25.0000 mg | ORAL_TABLET | Freq: Three times a day (TID) | ORAL | Status: DC | PRN

## 2015-12-01 MED ORDER — ONDANSETRON HCL 4 MG/2ML IJ SOLN
4.0000 mg | Freq: Once | INTRAMUSCULAR | Status: AC
  Administered 2015-12-01: 4 mg via INTRAVENOUS
  Filled 2015-12-01: qty 2

## 2015-12-01 MED ORDER — RIVAROXABAN 15 MG PO TABS: 15.0000 mg | ORAL_TABLET | Freq: Every day | ORAL | Status: DC

## 2015-12-01 MED ORDER — ACETAMINOPHEN 650 MG RE SUPP: 650.0000 mg | Freq: Four times a day (QID) | RECTAL | Status: DC | PRN

## 2015-12-01 MED ORDER — GADOBENATE DIMEGLUMINE 529 MG/ML IV SOLN
13.0000 mL | Freq: Once | INTRAVENOUS | Status: AC | PRN
  Administered 2015-12-01: 13 mL via INTRAVENOUS

## 2015-12-01 MED ORDER — ZOLPIDEM TARTRATE 5 MG PO TABS
2.5000 mg | ORAL_TABLET | Freq: Every day | ORAL | Status: DC
  Administered 2015-12-01: 2.5 mg via ORAL
  Filled 2015-12-01: qty 1

## 2015-12-01 NOTE — ED Notes (Signed)
During ortho vital signs pt complained of slight dizziness and nausea while sitting. Pt did not feel like she was able to stand.  After vitals were completed, pt requested to be returned to a laying position.

## 2015-12-01 NOTE — ED Notes (Signed)
Patient still off the floor for scan. 

## 2015-12-01 NOTE — H&P (Addendum)
[facility] History and Physical  Cheryl Vargas [id number] DOB: 08/01/1935 DOA: 12/01/2015  Referring physician:  PCP: Irven Shelling, MD  Cards: Dr. Shonna Chock  Chief Complaint: Vertigo  HPI: Cheryl Vargas is a 81 y.o. female with a history of Afib, HTN, presenting with chest discomfort (now resolved)  nausea without vomiting and dizziness, unable to walk due to symptoms, worse with standing. No syncope. No headaches or vision changes. No shortness of breath. She was seen at the ED 1 month ago for palpitations with Afib with multiple medicine changes, adding Lisinopril to the regimen and chaging BB doses several times without relief. Pt did not take bblocker in hopes that her symptoms would improve. Awoke this am feeling nauseaus and had loose stools x1. She took an additional coreg at that time hoping it would hlelp. She feels some of her symptoms may have been from the food she ate. Initially patient states that the description of her dizziness was the room spinning this has since resolved and is now more a sensation of feeling lightheaded. Patient feels significantly improved after coming to the ED and receiving IV fluids and medications. Patient along with symptoms at rest.  Cards evaluated patient, recommending decreasing lisinopril to half and to take Coreg once daily.    Review of Systems:  Constitutional:  No weight loss, night sweats, fevers, chills HEENT: No headaches, difficulty swallowing,tooth/dental problems, sore throat Cardio-vascular:  No chest pain, orthopnea, PND, swelling in lower extremities, anasarca, dizziness, palpitations  GI:  No heartburn, indigestion, abdominal pain,  vomiting, diarrhea, change in bowel habits, loss of appetite  Respiratory:  No shortness of breath with exertion or at rest. No excess mucus, no productive cough, No non-productive cough, No coughing up of blood. No change in color of mucus. No wheezing .No chest wall deformity    Skin:  No rash or lesions.  GU:  no dysuria, change in color of urine, no urgency or frequency. No flank pain.  Musculoskeletal:   No joint pain or swelling. No decreased range of motion. No back pain.  Psych:  No change in mood or affect. No depression or anxiety. No memory loss.  Neuro:  No change in sensation, unilateral strength, or cognitive abilities  All other systems were reviewed and are negative.  Past Medical History  Diagnosis Date  . Enlarged heart   . Dysrhythmia     pvc's per pt  . Arthritis   . PONV (postoperative nausea and vomiting)    Past Surgical History  Procedure Laterality Date  . Tonsillectomy    . Tubal ligation    . Shoulder surgery      closed reduction  . Bunionectomy    . Cardiac catheterization      in 2004 at [place]. "Insignificant blockage" per patient  . Total hip arthroplasty Left 03/18/2013    Procedure: TOTAL HIP ARTHROPLASTY ANTERIOR APPROACH;  Surgeon: Mauri Pole, MD;  Location: [facility];  Service: Orthopedics;  Laterality: Left;   Social History:  reports that she has never smoked. She has never used smokeless tobacco. She reports that she drinks about 1.8 oz of alcohol per week. She reports that she does not use illicit drugs.  Allergies  Allergen Reactions  . Alendronate Sodium Other (See Comments)    Arm pain    Family History  Problem Relation Age of Onset  . Lung cancer Mother   . Heart attack Father      Prior to Admission medications   Medication  Sig Start Date End Date Taking? Authorizing Provider  acetaminophen (TYLENOL) 500 MG tablet Take 500 mg by mouth every 6 (six) hours as needed for moderate pain.   Yes Historical Provider, MD  carvedilol (COREG) 3.125 MG tablet Take 1 tablet (3.125 mg total) by mouth 2 (two) times daily. 11/09/15  Yes Will Meredith Leeds, MD  lisinopril (PRINIVIL,ZESTRIL) 5 MG tablet Take 1 tablet (5 mg total) by mouth daily. Patient taking differently: Take 5 mg by mouth at bedtime.   10/20/15  Yes Will Meredith Leeds, MD  Rivaroxaban (XARELTO) 15 MG TABS tablet Take 1 tablet (15 mg total) by mouth daily with supper. 10/20/15  Yes Will Meredith Leeds, MD  zolpidem (AMBIEN) 5 MG tablet Take 2.5 mg by mouth at bedtime.    Yes Historical Provider, MD  cephALEXin (KEFLEX) 250 MG capsule Take 1 capsule (250 mg total) by mouth 4 (four) times daily. 10/18/15   Julianne Rice, MD  Rivaroxaban (XARELTO) 15 MG TABS tablet Take 1 tablet (15 mg total) by mouth daily with supper. 10/20/15   Will Meredith Leeds, MD   Physical Exam: Filed Vitals:   12/01/15 1848 12/01/15 1849 12/01/15 1855 12/01/15 1856  BP:      Pulse: 73 74 84 86  Resp: 11 19 29 21   SpO2: 97% 96% 95% 97%    Wt Readings from Last 3 Encounters:  10/20/15 67.586 kg (149 lb)  03/17/13 61.009 kg (134 lb 8 oz)    General: Appears calm and comfortable Eyes:  PERRL, EOMI, normal lids, iris ENT: grossly normal hearing, lips & tongue Neck: no lymphadenopathy, masses or thyromegaly Cardiovascular: regular rate and rythm, no murmurs, rubs or gallops. No lower extremity edema   Respiratory: clear to auscultation bilaterally, no wheezing, rhonhci or rales. Normal respiratory effort. Abdomen: soft,non-tender, normal bowel sounds Skin: no rash or induration seen on limited exam. No open lesions. Musculoskeletal:  grossly normal tone in both upper and lower extremities Psychiatric: grossly normal mood and affect, speech fluent and appropriate Neurologic: No dysmetria. CN 2-12 grossly intact. Moves all extremities in coordinated fashion. Pt unwilling to sit at this time due to symptoms.           Labs on Admission:  Basic Metabolic Panel:  Recent Labs Lab 12/01/15 1205  NA 138  K 4.6  CL 106  CO2 22  GLUCOSE 129*  BUN 9  CREATININE 0.77  CALCIUM 9.5    Liver Function Tests: No results for input(s): AST, ALT, ALKPHOS, BILITOT, PROT, ALBUMIN in the last 168 hours. No results for input(s): LIPASE, AMYLASE in the last  168 hours. No results for input(s): AMMONIA in the last 168 hours.  CBC:  Recent Labs Lab 12/01/15 1205  WBC 6.6  HGB 14.5  HCT 40.5  MCV 88.6  PLT 182    Cardiac Enzymes: No results for input(s): CKTOTAL, CKMB, CKMBINDEX, TROPONINI in the last 168 hours.  BNP (last 3 results)  Recent Labs  10/18/15 1857  BNP 536.7*    ProBNP (last 3 results) No results for input(s): PROBNP in the last 8760 hours.   Creatinine clearance cannot be calculated (Unknown ideal weight.)  CBG: No results for input(s): GLUCAP in the last 168 hours.  Radiological Exams on Admission: Mr Angiogram Head Wo Contrast  12/01/2015  CLINICAL DATA:  New onset of dizziness and chest pressure. Presyncopal feeling. Vertigo when she moves her head. Personal history of atrial fibrillation on Xarelto. EXAM: MRI HEAD WITHOUT  CONTRAST MRA HEAD WITHOUT CONTRAST  MRA NECK WITHOUT AND WITH CONTRAST TECHNIQUE: Multiplanar, multiecho pulse sequences of the brain and surrounding structures were obtained without intravenous contrast. Angiographic images of the Circle of Willis were obtained using MRA technique without intravenous contrast. Angiographic images of the neck were obtained using MRA technique without and with intravenous contrast. Carotid stenosis measurements (when applicable) are obtained utilizing NASCET criteria, using the distal internal carotid diameter as the denominator. CONTRAST:  85mL MULTIHANCE GADOBENATE DIMEGLUMINE 529 MG/ML IV SOLN COMPARISON:  The diffusion-weighted images demonstrate no evidence for acute or subacute infarction. FINDINGS: MRI HEAD FINDINGS The diffusion-weighted images demonstrate no evidence for acute or subacute infarction. Mild periventricular and scattered diffuse subcortical T2 changes are present bilaterally. There is mild generalized atrophy. The ventricles are proportionate to the degree of atrophy. No significant extra-axial fluid collection is present. The internal auditory  canals are within normal limits bilaterally. Flow is present in the major intracranial arteries. Globes and orbits are intact. The paranasal sinuses and mastoid air cells are clear. The skullbase is normal. Midline sagittal images demonstrate a relatively empty sella no other focal lesions are present. MRA HEAD FINDINGS Internal carotid arteries are within normal limits from the high cervical segments through the ICA termini bilaterally. There is no focal irregularity or stenosis. The A1 and M1 segments are normal. The anterior communicating artery is patent. The MCA bifurcations are intact bilaterally. MCA and ACA branch vessels are within normal limits. The left vertebral artery is the dominant vessel. The PICA origins are visualized and normal. The right AICA is dominant. The basilar artery is normal. Both posterior cerebral arteries originate from the basilar tip. PCA branch vessels are within normal limits. MRA NECK FINDINGS The time-of-flight images demonstrate no significant flow disturbance at either carotid bifurcation. Postcontrast images do not include the origins of the left common carotid artery or the innominate artery. The anterior aortic arch is normal. There is mild tortuosity of the right common carotid artery without a significant stenosis. The carotid bifurcation is low. There is mild irregularity of the right carotid bifurcation without a significant focal stenosis. The cervical ICA is otherwise normal. The left common carotid artery is within normal limits. The bifurcation is low. There is no significant stenosis. The cervical left ICA is normal. The vertebral arteries originate from the subclavian arteries bilaterally. The left vertebral artery is dominant vessel. There is no significant proximal stenosis. Tortuosity is noted within the cervical vertebral arteries bilaterally. The vertebrobasilar junction is within normal limits. IMPRESSION: 1. No acute intracranial abnormality. 2. Mild  periventricular and subcortical white matter changes bilaterally are slightly advanced for age. This is nonspecific, but likely reflects the sequela of chronic microvascular ischemia. 3. Normal variant MRA circle of Willis without significant proximal stenosis, aneurysm, or branch vessel occlusions. 4. Mild tortuosity of proximal vasculature. The origins of the left subclavian artery and left common carotid artery are not imaged. 5. Low bifurcation of the carotid arteries bilaterally with mild irregularity on the right but no significant focal stenosis. Electronically Signed   By: San Morelle M.D.   On: 12/01/2015 18:57   Mr Angiogram Neck W Wo Contrast  12/01/2015  CLINICAL DATA:  New onset of dizziness and chest pressure. Presyncopal feeling. Vertigo when she moves her head. Personal history of atrial fibrillation on Xarelto. EXAM: MRI HEAD WITHOUT  CONTRAST MRA HEAD WITHOUT CONTRAST MRA NECK WITHOUT AND WITH CONTRAST TECHNIQUE: Multiplanar, multiecho pulse sequences of the brain and surrounding structures were obtained without intravenous contrast. Angiographic images of  the Circle of Willis were obtained using MRA technique without intravenous contrast. Angiographic images of the neck were obtained using MRA technique without and with intravenous contrast. Carotid stenosis measurements (when applicable) are obtained utilizing NASCET criteria, using the distal internal carotid diameter as the denominator. CONTRAST:  66mL MULTIHANCE GADOBENATE DIMEGLUMINE 529 MG/ML IV SOLN COMPARISON:  The diffusion-weighted images demonstrate no evidence for acute or subacute infarction. FINDINGS: MRI HEAD FINDINGS The diffusion-weighted images demonstrate no evidence for acute or subacute infarction. Mild periventricular and scattered diffuse subcortical T2 changes are present bilaterally. There is mild generalized atrophy. The ventricles are proportionate to the degree of atrophy. No significant extra-axial fluid  collection is present. The internal auditory canals are within normal limits bilaterally. Flow is present in the major intracranial arteries. Globes and orbits are intact. The paranasal sinuses and mastoid air cells are clear. The skullbase is normal. Midline sagittal images demonstrate a relatively empty sella no other focal lesions are present. MRA HEAD FINDINGS Internal carotid arteries are within normal limits from the high cervical segments through the ICA termini bilaterally. There is no focal irregularity or stenosis. The A1 and M1 segments are normal. The anterior communicating artery is patent. The MCA bifurcations are intact bilaterally. MCA and ACA branch vessels are within normal limits. The left vertebral artery is the dominant vessel. The PICA origins are visualized and normal. The right AICA is dominant. The basilar artery is normal. Both posterior cerebral arteries originate from the basilar tip. PCA branch vessels are within normal limits. MRA NECK FINDINGS The time-of-flight images demonstrate no significant flow disturbance at either carotid bifurcation. Postcontrast images do not include the origins of the left common carotid artery or the innominate artery. The anterior aortic arch is normal. There is mild tortuosity of the right common carotid artery without a significant stenosis. The carotid bifurcation is low. There is mild irregularity of the right carotid bifurcation without a significant focal stenosis. The cervical ICA is otherwise normal. The left common carotid artery is within normal limits. The bifurcation is low. There is no significant stenosis. The cervical left ICA is normal. The vertebral arteries originate from the subclavian arteries bilaterally. The left vertebral artery is dominant vessel. There is no significant proximal stenosis. Tortuosity is noted within the cervical vertebral arteries bilaterally. The vertebrobasilar junction is within normal limits. IMPRESSION: 1. No  acute intracranial abnormality. 2. Mild periventricular and subcortical white matter changes bilaterally are slightly advanced for age. This is nonspecific, but likely reflects the sequela of chronic microvascular ischemia. 3. Normal variant MRA circle of Willis without significant proximal stenosis, aneurysm, or branch vessel occlusions. 4. Mild tortuosity of proximal vasculature. The origins of the left subclavian artery and left common carotid artery are not imaged. 5. Low bifurcation of the carotid arteries bilaterally with mild irregularity on the right but no significant focal stenosis. Electronically Signed   By: San Morelle M.D.   On: 12/01/2015 18:57   Mr Brain Wo Contrast  12/01/2015  CLINICAL DATA:  New onset of dizziness and chest pressure. Presyncopal feeling. Vertigo when she moves her head. Personal history of atrial fibrillation on Xarelto. EXAM: MRI HEAD WITHOUT  CONTRAST MRA HEAD WITHOUT CONTRAST MRA NECK WITHOUT AND WITH CONTRAST TECHNIQUE: Multiplanar, multiecho pulse sequences of the brain and surrounding structures were obtained without intravenous contrast. Angiographic images of the Circle of Willis were obtained using MRA technique without intravenous contrast. Angiographic images of the neck were obtained using MRA technique without and with intravenous contrast.  Carotid stenosis measurements (when applicable) are obtained utilizing NASCET criteria, using the distal internal carotid diameter as the denominator. CONTRAST:  78mL MULTIHANCE GADOBENATE DIMEGLUMINE 529 MG/ML IV SOLN COMPARISON:  The diffusion-weighted images demonstrate no evidence for acute or subacute infarction. FINDINGS: MRI HEAD FINDINGS The diffusion-weighted images demonstrate no evidence for acute or subacute infarction. Mild periventricular and scattered diffuse subcortical T2 changes are present bilaterally. There is mild generalized atrophy. The ventricles are proportionate to the degree of atrophy. No  significant extra-axial fluid collection is present. The internal auditory canals are within normal limits bilaterally. Flow is present in the major intracranial arteries. Globes and orbits are intact. The paranasal sinuses and mastoid air cells are clear. The skullbase is normal. Midline sagittal images demonstrate a relatively empty sella no other focal lesions are present. MRA HEAD FINDINGS Internal carotid arteries are within normal limits from the high cervical segments through the ICA termini bilaterally. There is no focal irregularity or stenosis. The A1 and M1 segments are normal. The anterior communicating artery is patent. The MCA bifurcations are intact bilaterally. MCA and ACA branch vessels are within normal limits. The left vertebral artery is the dominant vessel. The PICA origins are visualized and normal. The right AICA is dominant. The basilar artery is normal. Both posterior cerebral arteries originate from the basilar tip. PCA branch vessels are within normal limits. MRA NECK FINDINGS The time-of-flight images demonstrate no significant flow disturbance at either carotid bifurcation. Postcontrast images do not include the origins of the left common carotid artery or the innominate artery. The anterior aortic arch is normal. There is mild tortuosity of the right common carotid artery without a significant stenosis. The carotid bifurcation is low. There is mild irregularity of the right carotid bifurcation without a significant focal stenosis. The cervical ICA is otherwise normal. The left common carotid artery is within normal limits. The bifurcation is low. There is no significant stenosis. The cervical left ICA is normal. The vertebral arteries originate from the subclavian arteries bilaterally. The left vertebral artery is dominant vessel. There is no significant proximal stenosis. Tortuosity is noted within the cervical vertebral arteries bilaterally. The vertebrobasilar junction is within  normal limits. IMPRESSION: 1. No acute intracranial abnormality. 2. Mild periventricular and subcortical white matter changes bilaterally are slightly advanced for age. This is nonspecific, but likely reflects the sequela of chronic microvascular ischemia. 3. Normal variant MRA circle of Willis without significant proximal stenosis, aneurysm, or branch vessel occlusions. 4. Mild tortuosity of proximal vasculature. The origins of the left subclavian artery and left common carotid artery are not imaged. 5. Low bifurcation of the carotid arteries bilaterally with mild irregularity on the right but no significant focal stenosis. Electronically Signed   By: San Morelle M.D.   On: 12/01/2015 18:57      Assessment/Plan Active Problems:   Atrial fibrillation (HCC)   Dizziness   Hypertension   Insomnia   Chronic combined systolic and diastolic CHF (congestive heart failure) (HCC)   Dizziness: Etiology likely multifactorial including atrial fibrillation, medication induced from beta blocker, upset stomach due to food the patient ate versus viral gastroenteritis. Intermittent and ongoing for the past month since starting ACE inhibitor and beta blocker. Multiple changes to these medications without improvement. Acute worsening overnight to the point where patient felt extremely nauseous, weak, and was unable to stand due to gait instability. Initially patient described this as being vertigo or the room spinning but is improved significantly here in the ED after IV fluids  and medications. MRI MRA without evidence of acute stroke or other significant abnormality. - Telemetry, observation - Meclizine when necessary dizziness - Half-normal saline 75 mL per hour - Neurology consult if not improving in a.m. - Hold beta blocker as below - orthostatics  Hypertension/A. fib: Multiple recent changes to blood pressure medications including addition of ACE inhibitor and various beta blockers. Patient appears  to be very intolerant to beta blocker. Cardiology evaluation in ED recommending cutting ACE inhibitor and beta blocker dose in half. Patient continues to be symptomatic so will DC beta blocker. Patient intermittently in atrial fibrillation with rate going from the 110s to 80. No evidence of RVR. - Continue Xarelto - DC Coreg - Decrease lisinopril to 2.5 mg daily - Diltiazem IV when necessary heart rate greater than 120  Insomnia : -  ciontinue Ambien  Chronic diastolic/systolic CHF: no evidence of acute decompensation. Last echo on 2/21/17EF 40% and Grade 2 diastolic dysfunction. CHA2DS2-VASc = 6  - Continue meds as above - Lasix PRN - Strict I/O, dly wts (pt on gentle hydration as above)   Code Status: Full Code  DVT Prophylaxis:Xarelto Family Communication: Son  at bedside Disposition Plan: Pending Improvement. Admitted for observation in tele bed. Expected LOS 24-48 hrs    Cheryl Vargas J,MD [facility] [email or web address] Password [email or web address]

## 2015-12-01 NOTE — Progress Notes (Signed)
Tech attempted to get patient's orthostatic vitals signs and standing weight earlier and patient refused due to dizziness when up per tech report.  RN educated patient on reason for orthostatic vitals and patient stated she felt like she could sit and stand so orthostatic vitals could be obtained at this time.

## 2015-12-01 NOTE — ED Notes (Signed)
Per EMS - pt from home. Pt woke up this morning w/ generalized weakness/sickness. C/o diarrhea, nausea. Denies vomiting. Pt c/o generalized chest pressure. 12-lead unremarkable. Hx afib, extensive heart hx. 4mg  zofran with no relief. No aspirin b/c cp subsided.

## 2015-12-01 NOTE — Progress Notes (Signed)
RN asked patient if she wanted to go to the sink and brush her teeth or wanted RN to set her up at bedside.  Patient requested graham crackers and Sprite at this time.  RN provided snack/drink and instructed patient to call and notify RN if she wanted to brush her teeth after finishing snack.  Patient stated understanding.

## 2015-12-01 NOTE — ED Provider Notes (Signed)
CSN: [id number]     Arrival date & time 12/01/15  1146 History   First MD Initiated Contact with Patient 12/01/15 1225     Chief Complaint  Patient presents with  . Chest Pain  . Nausea  . Weakness     (Consider location/radiation/quality/duration/timing/severity/associated sxs/prior Treatment) HPI   Cheryl Vargas is a 81 y.o F with a pmhx of a.fib on Xarelto, HTN who presents to the emergency department today complaining of dizziness and chest pressure. Patient states that when she woke up this morning she was very dizzy and felt as if she was going to pass out while walking. She had associated pressure in her epigastrium/left chest and nausea. No associated pain, vomiting, syncope or shortness of breath. Her dizziness increases when she moves her head and she states she feels like the room is spinning. Patient states that one month ago she was seen in the ED for palpitations. The following day she saw her cardiologist Dr.Camnitz, who adjusted her home medications. Lisinopril was added to her regimen, metoprolol was changed to Toprol-XL and she was placed on Xarelto. Since then patient has felt very fatigued and tired. Her medications were again changed, by decreasing Toprol dose with no relief of her symptoms. Her medications were again changed by discontinuing Toprol and changing to carvedilol. Patient has still had no relief of her symptoms. She states that yesterday she did not take her carvedilol at all as she fell she did not need this anymore. When she woke up with her symptoms of dizziness today she decided to take her medications.  Past Medical History  Diagnosis Date  . Enlarged heart   . Dysrhythmia     pvc's per pt  . Arthritis   . PONV (postoperative nausea and vomiting)    Past Surgical History  Procedure Laterality Date  . Tonsillectomy    . Tubal ligation    . Shoulder surgery      closed reduction  . Bunionectomy    . Cardiac catheterization      in 2004 at  [place]. "Insignificant blockage" per patient  . Total hip arthroplasty Left 03/18/2013    Procedure: TOTAL HIP ARTHROPLASTY ANTERIOR APPROACH;  Surgeon: Mauri Pole, MD;  Location: [facility];  Service: Orthopedics;  Laterality: Left;   Family History  Problem Relation Age of Onset  . Lung cancer Mother   . Heart attack Father    Social History  Substance Use Topics  . Smoking status: Never Smoker   . Smokeless tobacco: Never Used  . Alcohol Use: 1.8 oz/week    3 Glasses of wine per week     Comment: per week   OB History    No data available     Review of Systems  All other systems reviewed and are negative.     Allergies  Alendronate sodium  Home Medications   Prior to Admission medications   Medication Sig Start Date End Date Taking? Authorizing Provider  carvedilol (COREG) 3.125 MG tablet Take 1 tablet (3.125 mg total) by mouth 2 (two) times daily. 11/09/15   Will Meredith Leeds, MD  cephALEXin (KEFLEX) 250 MG capsule Take 1 capsule (250 mg total) by mouth 4 (four) times daily. 10/18/15   Julianne Rice, MD  lisinopril (PRINIVIL,ZESTRIL) 5 MG tablet Take 1 tablet (5 mg total) by mouth daily. 10/20/15   Will Meredith Leeds, MD  naproxen sodium (ANAPROX) 220 MG tablet Take 220 mg by mouth daily as needed. For ear ache per patient  Historical Provider, MD  Rivaroxaban (XARELTO) 15 MG TABS tablet Take 1 tablet (15 mg total) by mouth daily with supper. 10/20/15   Will Meredith Leeds, MD  Rivaroxaban (XARELTO) 15 MG TABS tablet Take 1 tablet (15 mg total) by mouth daily with supper. 10/20/15   Will Meredith Leeds, MD  zolpidem (AMBIEN) 5 MG tablet Take 2.5 mg by mouth at bedtime as needed for sleep.    Historical Provider, MD   There were no vitals taken for this visit. Physical Exam  Constitutional: She is oriented to person, place, and time. She appears well-developed and well-nourished. No distress.  HENT:  Head: Normocephalic and atraumatic.  Mouth/Throat: No oropharyngeal  exudate.  Eyes: Conjunctivae and EOM are normal. Pupils are equal, round, and reactive to light. Right eye exhibits no discharge. Left eye exhibits no discharge. No scleral icterus.  Cardiovascular: Normal rate, normal heart sounds and intact distal pulses.  Exam reveals no gallop and no friction rub.   No murmur heard. Irregular rhythm. There are PVCs.  Pulmonary/Chest: Effort normal and breath sounds normal. No respiratory distress. She has no wheezes. She has no rales. She exhibits no tenderness.  Abdominal: Soft. She exhibits no distension. There is no tenderness. There is no guarding.  Musculoskeletal: Normal range of motion. She exhibits no edema.  Neurological: She is alert and oriented to person, place, and time.  Strength 5/5 throughout. No sensory deficits.  Normal finger to nose. Patient significantly dizzy with rotation of head and with sitting up. Unable to stand due to dizziness.  Skin: Skin is warm and dry. No rash noted. She is not diaphoretic. No erythema. No pallor.  Psychiatric: She has a normal mood and affect. Her behavior is normal.  Nursing note and vitals reviewed.   ED Course  Procedures (including critical care time) Labs Review Labs Reviewed  BASIC METABOLIC PANEL - Abnormal; Notable for the following:    Glucose, Bld 129 (*)    All other components within normal limits  CBC  I-STAT TROPOININ, ED  I-STAT TROPOININ, ED    Imaging Review No results found. I have personally reviewed and evaluated these images and lab results as part of my medical decision-making.   EKG Interpretation   Date/Time:  Wednesday December 01 2015 12:03:52 EDT Ventricular Rate:  86 PR Interval:  169 QRS Duration: 150 QT Interval:  406 QTC Calculation: 486 R Axis:   -48 Text Interpretation:  Sinus rhythm Multiple premature complexes, vent &  supraven Nonspecific IVCD with LAD Probable anteroseptal infarct, old Mild  ST dep V4/5 Confirmed by ZAVITZ  MD, JOSHUA ([id number]) on  12/01/2015 1:03:49 PM      MDM   Final diagnoses:  Dizziness    81 y.o F with hx of a.fib presents with significant dizziness and chest pressure. In the ED pt appears very uncomfortable, unable to sit/stand due to dizziness. Unable to perform orthostatics. No dysmetria on exam. All vital signs stable. Patient has had multiple medication changes recently. Patient has had difficulty with the beta blocker dosage she is taking Indocin that he is very fatigued and tired. Yesterday she did not take her carvedilol and today when she woke up she was significantly dizzy. He suspects that patient's dizziness is related to medication changes versus vertigo-like symptoms. I spoke with cardiology who recommends decreasing her lisinopril dose by cutting in half and taking the Coreg only once daily. Patient was given IV fluids, Zofran, meclizine. She does report improvement in her symptoms while lying flat.  However with sitting and standing patient was significantly dizzy and off balance. Recommends admission for observation. We'll obtain MRI as well to rule out posterior stroke. I spoke with hospitalist who will consult patient in the ED for admission.  Patient was discussed with and seen by Dr. Reather Converse who agrees with the treatment plan.     Dondra Spry Crane, PA-C 12/01/15 1628  Elnora Morrison, MD 12/01/15 [phone]

## 2015-12-01 NOTE — ED Notes (Signed)
Patient just returned from MRI. 

## 2015-12-01 NOTE — ED Notes (Signed)
Pt refusing chest xray. Explained necessity for chest xray for full assessment and pt still refusing.

## 2015-12-02 DIAGNOSIS — R42 Dizziness and giddiness: Secondary | ICD-10-CM | POA: Diagnosis not present

## 2015-12-02 DIAGNOSIS — I10 Essential (primary) hypertension: Secondary | ICD-10-CM

## 2015-12-02 DIAGNOSIS — I5042 Chronic combined systolic (congestive) and diastolic (congestive) heart failure: Secondary | ICD-10-CM | POA: Diagnosis not present

## 2015-12-02 DIAGNOSIS — I480 Paroxysmal atrial fibrillation: Secondary | ICD-10-CM

## 2015-12-02 LAB — CBC
HCT: 39.4 % (ref 36.0–46.0)
Hemoglobin: 13.6 g/dL (ref 12.0–15.0)
MCH: 30.8 pg (ref 26.0–34.0)
MCHC: 34.5 g/dL (ref 30.0–36.0)
MCV: 89.3 fL (ref 78.0–100.0)
Platelets: 170 10*3/uL (ref 150–400)
RBC: 4.41 MIL/uL (ref 3.87–5.11)
RDW: 12.3 % (ref 11.5–15.5)
WBC: 5.8 10*3/uL (ref 4.0–10.5)

## 2015-12-02 LAB — COMPREHENSIVE METABOLIC PANEL
ALT: 12 U/L — ABNORMAL LOW (ref 14–54)
AST: 19 U/L (ref 15–41)
Albumin: 2.8 g/dL — ABNORMAL LOW (ref 3.5–5.0)
Alkaline Phosphatase: 49 U/L (ref 38–126)
Anion gap: 9 (ref 5–15)
BUN: 7 mg/dL (ref 6–20)
CO2: 24 mmol/L (ref 22–32)
Calcium: 8.5 mg/dL — ABNORMAL LOW (ref 8.9–10.3)
Chloride: 107 mmol/L (ref 101–111)
Creatinine, Ser: 0.77 mg/dL (ref 0.44–1.00)
GFR calc Af Amer: >60 mL/min (ref >60)
GFR calc non Af Amer: >60 mL/min (ref >60)
Glucose, Bld: 101 mg/dL — ABNORMAL HIGH (ref 65–99)
Potassium: 3.7 mmol/L (ref 3.5–5.1)
Sodium: 140 mmol/L (ref 135–145)
Total Bilirubin: 0.9 mg/dL (ref 0.3–1.2)
Total Protein: 5.9 g/dL — ABNORMAL LOW (ref 6.5–8.1)

## 2015-12-02 MED ORDER — CARVEDILOL 3.125 MG PO TABS: 3.1250 mg | ORAL_TABLET | Freq: Two times a day (BID) | ORAL | Status: DC

## 2015-12-02 NOTE — Discharge Summary (Signed)
Physician Discharge Summary  Cheryl Vargas [id number] DOB: Jan 09, 1935 DOA: 12/01/2015  PCP: Irven Shelling, MD  Admit date: 12/01/2015 Discharge date: 12/02/2015  Time spent: > 30 minutes  Recommendations for Outpatient Follow-up:  1. Follow up with Dr. Laurann Montana in 2-4 weeks 2. Follow up with Dr. Curt Bears   Discharge Diagnoses:  Active Problems:   Atrial fibrillation (HCC)   Dizziness   Hypertension   Insomnia   Chronic combined systolic and diastolic CHF (congestive heart failure) ([place])  Discharge Condition: stable  Diet recommendation: heart healthy  Filed Weights   12/01/15 2048 12/02/15 0612  Weight: 66.4 kg (146 lb 6.2 oz) 66.996 kg (147 lb 11.2 oz)    History of present illness:  Cheryl Vargas is a 81 y.o. female with a history of Afib, HTN, presenting with chest discomfort (now resolved) nausea without vomiting and dizziness, unable to walk due to symptoms, worse with standing. No syncope. No headaches or vision changes. No shortness of breath. She was seen at the ED 1 month ago for palpitations with Afib with multiple medicine changes, adding Lisinopril to the regimen and chaging BB doses several times without relief. Pt did not take bblocker in hopes that her symptoms would improve. Awoke this am feeling nauseaus and had loose stools x1. She took an additional coreg at that time hoping it would hlelp. She feels some of her symptoms may have been from the food she ate. Initially patient states that the description of her dizziness was the room spinning this has since resolved and is now more a sensation of feeling lightheaded. Patient feels significantly improved after coming to the ED and receiving IV fluids and medications. Patient along with symptoms at rest.   Hospital Course:  Patient was admitted to the hospital with dizziness and presyncopal episodes, was found to be profoundly orthostatic (93/51 sitting >> 61/41 standing). She had a transient self  resolving gastroenteritis the day prior to admission, she thinks it was related to food, and she had about 10 large volume loose bowel movements followed by hypotension. Patient was hydrated with IV fluids, significant improvement in her symptoms, her nausea has resolved and she was able to tolerate a regular diet. Her diarrhea has resolved. Her orthostasis has resolved, and she is feeling back to normal. She will be discharged home in stable condition, improved, and I advised her to continue the Coreg to prevent further episodes of A. fib with RVR which she had earlier this year, but given hypotension to hold the lisinopril until seen by her PCP or Dr. Curt Bears with cardiology. Discussed extensively with the patient that she will benefit from an ACE inhibitor however it's best hold this medications for now until her blood pressure stabilizes as an outpatient. Her A. fib was rate controlled without any active issues during this hospitalization. She has a history of chronic combined heart failure without any evidence of decompensation, she tolerated fluids well and is euvolemic on discharge. No other changes have been made to her chronic medication regimen.  Procedures:  None    Consultations:  None   Discharge Exam: Filed Vitals:   12/01/15 1953 12/01/15 2048 12/02/15 0612 12/02/15 1357  BP: 135/67  93/51 115/57  Pulse: 73  67   Temp: 98.8 F (37.1 C)  98.1 F (36.7 C)   TempSrc: Oral  Oral   Resp: 20  20   Height:  5\' 5"  (1.651 m)    Weight:  66.4 kg (146 lb 6.2 oz) 66.996  kg (147 lb 11.2 oz)   SpO2:        General: NAD  Cardiovascular: RRR Respiratory: CTA biL  Discharge Instructions Activity:  As tolerated   Get Medicines reviewed and adjusted: Please take all your medications with you for your next visit with your Primary MD  Please request your Primary MD to go over all hospital tests and procedure/radiological results at the follow up, please ask your Primary MD to get all  Hospital records sent to his/her office.  If you experience worsening of your admission symptoms, develop shortness of breath, life threatening emergency, suicidal or homicidal thoughts you must seek medical attention immediately by calling 911 or calling your MD immediately if symptoms less severe.  You must read complete instructions/literature along with all the possible adverse reactions/side effects for all the Medicines you take and that have been prescribed to you. Take any new Medicines after you have completely understood and accpet all the possible adverse reactions/side effects.   Do not drive when taking Pain medications.   Do not take more than prescribed Pain, Sleep and Anxiety Medications  Special Instructions: If you have smoked or chewed Tobacco in the last 2 yrs please stop smoking, stop any regular Alcohol and or any Recreational drug use.  Wear Seat belts while driving.  Please note  You were cared for by a hospitalist during your hospital stay. Once you are discharged, your primary care physician will handle any further medical issues. Please note that NO REFILLS for any discharge medications will be authorized once you are discharged, as it is imperative that you return to your primary care physician (or establish a relationship with a primary care physician if you do not have one) for your aftercare needs so that they can reassess your need for medications and monitor your lab values.    Medication List    STOP taking these medications        lisinopril 5 MG tablet  Commonly known as:  PRINIVIL,ZESTRIL      TAKE these medications        acetaminophen 500 MG tablet  Commonly known as:  TYLENOL  Take 500 mg by mouth every 6 (six) hours as needed for moderate pain.     carvedilol 3.125 MG tablet  Commonly known as:  COREG  Take 1 tablet (3.125 mg total) by mouth 2 (two) times daily.     cephALEXin 250 MG capsule  Commonly known as:  KEFLEX  Take 1 capsule  (250 mg total) by mouth 4 (four) times daily.     Rivaroxaban 15 MG Tabs tablet  Commonly known as:  XARELTO  Take 1 tablet (15 mg total) by mouth daily with supper.     zolpidem 5 MG tablet  Commonly known as:  AMBIEN  Take 2.5 mg by mouth at bedtime.           Follow-up Information    Follow up with Irven Shelling, MD. Schedule an appointment as soon as possible for a visit in 2 weeks.   Specialty:  Internal Medicine   Contact information:   [address] [place] [address] [phone]       The results of significant diagnostics from this hospitalization (including imaging, microbiology, ancillary and laboratory) are listed below for reference.    Significant Diagnostic Studies: Mr Angiogram Head Wo Contrast  12/01/2015  CLINICAL DATA:  New onset of dizziness and chest pressure. Presyncopal feeling. Vertigo when she moves her head.  Personal history of atrial fibrillation on Xarelto. EXAM: MRI HEAD WITHOUT  CONTRAST MRA HEAD WITHOUT CONTRAST MRA NECK WITHOUT AND WITH CONTRAST TECHNIQUE: Multiplanar, multiecho pulse sequences of the brain and surrounding structures were obtained without intravenous contrast. Angiographic images of the Circle of Willis were obtained using MRA technique without intravenous contrast. Angiographic images of the neck were obtained using MRA technique without and with intravenous contrast. Carotid stenosis measurements (when applicable) are obtained utilizing NASCET criteria, using the distal internal carotid diameter as the denominator. CONTRAST:  1mL MULTIHANCE GADOBENATE DIMEGLUMINE 529 MG/ML IV SOLN COMPARISON:  The diffusion-weighted images demonstrate no evidence for acute or subacute infarction. FINDINGS: MRI HEAD FINDINGS The diffusion-weighted images demonstrate no evidence for acute or subacute infarction. Mild periventricular and scattered diffuse subcortical T2 changes are present bilaterally. There is mild generalized  atrophy. The ventricles are proportionate to the degree of atrophy. No significant extra-axial fluid collection is present. The internal auditory canals are within normal limits bilaterally. Flow is present in the major intracranial arteries. Globes and orbits are intact. The paranasal sinuses and mastoid air cells are clear. The skullbase is normal. Midline sagittal images demonstrate a relatively empty sella no other focal lesions are present. MRA HEAD FINDINGS Internal carotid arteries are within normal limits from the high cervical segments through the ICA termini bilaterally. There is no focal irregularity or stenosis. The A1 and M1 segments are normal. The anterior communicating artery is patent. The MCA bifurcations are intact bilaterally. MCA and ACA branch vessels are within normal limits. The left vertebral artery is the dominant vessel. The PICA origins are visualized and normal. The right AICA is dominant. The basilar artery is normal. Both posterior cerebral arteries originate from the basilar tip. PCA branch vessels are within normal limits. MRA NECK FINDINGS The time-of-flight images demonstrate no significant flow disturbance at either carotid bifurcation. Postcontrast images do not include the origins of the left common carotid artery or the innominate artery. The anterior aortic arch is normal. There is mild tortuosity of the right common carotid artery without a significant stenosis. The carotid bifurcation is low. There is mild irregularity of the right carotid bifurcation without a significant focal stenosis. The cervical ICA is otherwise normal. The left common carotid artery is within normal limits. The bifurcation is low. There is no significant stenosis. The cervical left ICA is normal. The vertebral arteries originate from the subclavian arteries bilaterally. The left vertebral artery is dominant vessel. There is no significant proximal stenosis. Tortuosity is noted within the cervical  vertebral arteries bilaterally. The vertebrobasilar junction is within normal limits. IMPRESSION: 1. No acute intracranial abnormality. 2. Mild periventricular and subcortical white matter changes bilaterally are slightly advanced for age. This is nonspecific, but likely reflects the sequela of chronic microvascular ischemia. 3. Normal variant MRA circle of Willis without significant proximal stenosis, aneurysm, or branch vessel occlusions. 4. Mild tortuosity of proximal vasculature. The origins of the left subclavian artery and left common carotid artery are not imaged. 5. Low bifurcation of the carotid arteries bilaterally with mild irregularity on the right but no significant focal stenosis. Electronically Signed   By: San Morelle M.D.   On: 12/01/2015 18:57   Mr Angiogram Neck W Wo Contrast  12/01/2015  CLINICAL DATA:  New onset of dizziness and chest pressure. Presyncopal feeling. Vertigo when she moves her head. Personal history of atrial fibrillation on Xarelto. EXAM: MRI HEAD WITHOUT  CONTRAST MRA HEAD WITHOUT CONTRAST MRA NECK WITHOUT AND WITH CONTRAST TECHNIQUE: Multiplanar,  multiecho pulse sequences of the brain and surrounding structures were obtained without intravenous contrast. Angiographic images of the Circle of Willis were obtained using MRA technique without intravenous contrast. Angiographic images of the neck were obtained using MRA technique without and with intravenous contrast. Carotid stenosis measurements (when applicable) are obtained utilizing NASCET criteria, using the distal internal carotid diameter as the denominator. CONTRAST:  35mL MULTIHANCE GADOBENATE DIMEGLUMINE 529 MG/ML IV SOLN COMPARISON:  The diffusion-weighted images demonstrate no evidence for acute or subacute infarction. FINDINGS: MRI HEAD FINDINGS The diffusion-weighted images demonstrate no evidence for acute or subacute infarction. Mild periventricular and scattered diffuse subcortical T2 changes are  present bilaterally. There is mild generalized atrophy. The ventricles are proportionate to the degree of atrophy. No significant extra-axial fluid collection is present. The internal auditory canals are within normal limits bilaterally. Flow is present in the major intracranial arteries. Globes and orbits are intact. The paranasal sinuses and mastoid air cells are clear. The skullbase is normal. Midline sagittal images demonstrate a relatively empty sella no other focal lesions are present. MRA HEAD FINDINGS Internal carotid arteries are within normal limits from the high cervical segments through the ICA termini bilaterally. There is no focal irregularity or stenosis. The A1 and M1 segments are normal. The anterior communicating artery is patent. The MCA bifurcations are intact bilaterally. MCA and ACA branch vessels are within normal limits. The left vertebral artery is the dominant vessel. The PICA origins are visualized and normal. The right AICA is dominant. The basilar artery is normal. Both posterior cerebral arteries originate from the basilar tip. PCA branch vessels are within normal limits. MRA NECK FINDINGS The time-of-flight images demonstrate no significant flow disturbance at either carotid bifurcation. Postcontrast images do not include the origins of the left common carotid artery or the innominate artery. The anterior aortic arch is normal. There is mild tortuosity of the right common carotid artery without a significant stenosis. The carotid bifurcation is low. There is mild irregularity of the right carotid bifurcation without a significant focal stenosis. The cervical ICA is otherwise normal. The left common carotid artery is within normal limits. The bifurcation is low. There is no significant stenosis. The cervical left ICA is normal. The vertebral arteries originate from the subclavian arteries bilaterally. The left vertebral artery is dominant vessel. There is no significant proximal  stenosis. Tortuosity is noted within the cervical vertebral arteries bilaterally. The vertebrobasilar junction is within normal limits. IMPRESSION: 1. No acute intracranial abnormality. 2. Mild periventricular and subcortical white matter changes bilaterally are slightly advanced for age. This is nonspecific, but likely reflects the sequela of chronic microvascular ischemia. 3. Normal variant MRA circle of Willis without significant proximal stenosis, aneurysm, or branch vessel occlusions. 4. Mild tortuosity of proximal vasculature. The origins of the left subclavian artery and left common carotid artery are not imaged. 5. Low bifurcation of the carotid arteries bilaterally with mild irregularity on the right but no significant focal stenosis. Electronically Signed   By: San Morelle M.D.   On: 12/01/2015 18:57   Mr Brain Wo Contrast  12/01/2015  CLINICAL DATA:  New onset of dizziness and chest pressure. Presyncopal feeling. Vertigo when she moves her head. Personal history of atrial fibrillation on Xarelto. EXAM: MRI HEAD WITHOUT  CONTRAST MRA HEAD WITHOUT CONTRAST MRA NECK WITHOUT AND WITH CONTRAST TECHNIQUE: Multiplanar, multiecho pulse sequences of the brain and surrounding structures were obtained without intravenous contrast. Angiographic images of the Circle of Willis were obtained using MRA technique without  intravenous contrast. Angiographic images of the neck were obtained using MRA technique without and with intravenous contrast. Carotid stenosis measurements (when applicable) are obtained utilizing NASCET criteria, using the distal internal carotid diameter as the denominator. CONTRAST:  73mL MULTIHANCE GADOBENATE DIMEGLUMINE 529 MG/ML IV SOLN COMPARISON:  The diffusion-weighted images demonstrate no evidence for acute or subacute infarction. FINDINGS: MRI HEAD FINDINGS The diffusion-weighted images demonstrate no evidence for acute or subacute infarction. Mild periventricular and scattered  diffuse subcortical T2 changes are present bilaterally. There is mild generalized atrophy. The ventricles are proportionate to the degree of atrophy. No significant extra-axial fluid collection is present. The internal auditory canals are within normal limits bilaterally. Flow is present in the major intracranial arteries. Globes and orbits are intact. The paranasal sinuses and mastoid air cells are clear. The skullbase is normal. Midline sagittal images demonstrate a relatively empty sella no other focal lesions are present. MRA HEAD FINDINGS Internal carotid arteries are within normal limits from the high cervical segments through the ICA termini bilaterally. There is no focal irregularity or stenosis. The A1 and M1 segments are normal. The anterior communicating artery is patent. The MCA bifurcations are intact bilaterally. MCA and ACA branch vessels are within normal limits. The left vertebral artery is the dominant vessel. The PICA origins are visualized and normal. The right AICA is dominant. The basilar artery is normal. Both posterior cerebral arteries originate from the basilar tip. PCA branch vessels are within normal limits. MRA NECK FINDINGS The time-of-flight images demonstrate no significant flow disturbance at either carotid bifurcation. Postcontrast images do not include the origins of the left common carotid artery or the innominate artery. The anterior aortic arch is normal. There is mild tortuosity of the right common carotid artery without a significant stenosis. The carotid bifurcation is low. There is mild irregularity of the right carotid bifurcation without a significant focal stenosis. The cervical ICA is otherwise normal. The left common carotid artery is within normal limits. The bifurcation is low. There is no significant stenosis. The cervical left ICA is normal. The vertebral arteries originate from the subclavian arteries bilaterally. The left vertebral artery is dominant vessel. There  is no significant proximal stenosis. Tortuosity is noted within the cervical vertebral arteries bilaterally. The vertebrobasilar junction is within normal limits. IMPRESSION: 1. No acute intracranial abnormality. 2. Mild periventricular and subcortical white matter changes bilaterally are slightly advanced for age. This is nonspecific, but likely reflects the sequela of chronic microvascular ischemia. 3. Normal variant MRA circle of Willis without significant proximal stenosis, aneurysm, or branch vessel occlusions. 4. Mild tortuosity of proximal vasculature. The origins of the left subclavian artery and left common carotid artery are not imaged. 5. Low bifurcation of the carotid arteries bilaterally with mild irregularity on the right but no significant focal stenosis. Electronically Signed   By: San Morelle M.D.   On: 12/01/2015 18:57   Labs: Basic Metabolic Panel:  Recent Labs Lab 12/01/15 1205 12/02/15 0430  NA 138 140  K 4.6 3.7  CL 106 107  CO2 22 24  GLUCOSE 129* 101*  BUN 9 7  CREATININE 0.77 0.77  CALCIUM 9.5 8.5*   Liver Function Tests:  Recent Labs Lab 12/02/15 0430  AST 19  ALT 12*  ALKPHOS 49  BILITOT 0.9  PROT 5.9*  ALBUMIN 2.8*   CBC:  Recent Labs Lab 12/01/15 1205 12/02/15 0430  WBC 6.6 5.8  HGB 14.5 13.6  HCT 40.5 39.4  MCV 88.6 89.3  PLT 182 170  BNP: BNP (last 3 results)  Recent Labs  10/18/15 1857  BNP 536.7*    Signed:  Marzetta Board  [facility] 12/02/2015, 2:29 PM

## 2015-12-02 NOTE — Care Management Note (Signed)
Case Management Note  Patient Details  Name: ELVIN BELLVILLE MRN: [id number] Date of Birth: August 31, 1935  Subjective/Objective: Pt admitted for A fib. Plan to return home once stable. No Home Care needs at this time.                    Action/Plan: Pt did have questions in regards to Xarelto. Per pt she received her 30 day free card at [facility] on [address]. Pt uses [facility] for mail order. CM did make pt aware that she would be able to get assistance via the Cardiology office to fill out the patient assistance form for Xarelto. Pt should be able to qualify for assistance for year supply. No further needs at this time.   Expected Discharge Date:                  Expected Discharge Plan:  Home/Self Care  In-House Referral:  NA  Discharge planning Services  CM Consult  Post Acute Care Choice:  NA Choice offered to:  NA  DME Arranged:  N/A DME Agency:  NA  HH Arranged:  NA HH Agency:  NA  Status of Service:  Completed, signed off  Medicare Important Message Given:    Date Medicare IM Given:    Medicare IM give by:    Date Additional Medicare IM Given:    Additional Medicare Important Message give by:     If discussed at [place] of Stay Meetings, dates discussed:    Additional Comments:  [name], RN 12/02/2015, 12:48 PM

## 2015-12-02 NOTE — Plan of Care (Signed)
Problem: Education: Goal: Knowledge of [facility] Education information/materials will improve Outcome: Progressing Patient aware of plan of care.  RN instructed patient if she needed to get out of bed for any reason to call and wait for staff assistance prior to getting out of bed.  Patient stated understanding and has made no attempts to get out bed thus far this shift.  Medication education provided to patient pertaining all medications administered thus far this shift.  Patient stated understanding.

## 2015-12-02 NOTE — Care Management Obs Status (Signed)
[place] NOTIFICATION   Patient Details  Name: Cheryl Vargas MRN: [id number] Date of Birth: [birth year]   Medicare Observation Status Notification Given:  Yes (a fib. )    [name], RN 12/02/2015, 12:47 PM

## 2015-12-02 NOTE — Discharge Instructions (Addendum)
Follow with Irven Shelling, MD in 5-7 days  Please hold your Lisinopril on discharge, please discuss with Dr. Curt Bears in 3-4 days about when you can resume this medication   Please get a complete blood count and chemistry panel checked by your Primary MD at your next visit, and again as instructed by your Primary MD. Please get your medications reviewed and adjusted by your Primary MD.  Please request your Primary MD to go over all Hospital Tests and Procedure/Radiological results at the follow up, please get all Hospital records sent to your Prim MD by signing hospital release before you go home.  If you had Pneumonia of Lung problems at the Hospital: Please get a 2 view Chest X ray done in 6-8 weeks after hospital discharge or sooner if instructed by your Primary MD.  If you have Congestive Heart Failure: Please call your Cardiologist or Primary MD anytime you have any of the following symptoms:  1) 3 pound weight gain in 24 hours or 5 pounds in 1 week  2) shortness of breath, with or without a dry hacking cough  3) swelling in the hands, feet or stomach  4) if you have to sleep on extra pillows at night in order to breathe  Follow cardiac low salt diet and 1.5 lit/day fluid restriction.  If you have diabetes Accuchecks 4 times/day, Once in AM empty stomach and then before each meal. Log in all results and show them to your primary doctor at your next visit. If any glucose reading is under 80 or above 300 call your primary MD immediately.  If you have Seizure/Convulsions/Epilepsy: Please do not drive, operate heavy machinery, participate in activities at heights or participate in high speed sports until you have seen by Primary MD or a Neurologist and advised to do so again.  If you had Gastrointestinal Bleeding: Please ask your Primary MD to check a complete blood count within one week of discharge or at your next visit. Your endoscopic/colonoscopic biopsies that are pending at  the time of discharge, will also need to followed by your Primary MD.  Get Medicines reviewed and adjusted. Please take all your medications with you for your next visit with your Primary MD  Please request your Primary MD to go over all hospital tests and procedure/radiological results at the follow up, please ask your Primary MD to get all Hospital records sent to his/her office.  If you experience worsening of your admission symptoms, develop shortness of breath, life threatening emergency, suicidal or homicidal thoughts you must seek medical attention immediately by calling 911 or calling your MD immediately  if symptoms less severe.  You must read complete instructions/literature along with all the possible adverse reactions/side effects for all the Medicines you take and that have been prescribed to you. Take any new Medicines after you have completely understood and accpet all the possible adverse reactions/side effects.   Do not drive or operate heavy machinery when taking Pain medications.   Do not take more than prescribed Pain, Sleep and Anxiety Medications  Special Instructions: If you have smoked or chewed Tobacco  in the last 2 yrs please stop smoking, stop any regular Alcohol  and or any Recreational drug use.  Wear Seat belts while driving.  Please note You were cared for by a hospitalist during your hospital stay. If you have any questions about your discharge medications or the care you received while you were in the hospital after you are discharged, you can call  the unit and asked to speak with the hospitalist on call if the hospitalist that took care of you is not available. Once you are discharged, your primary care physician will handle any further medical issues. Please note that NO REFILLS for any discharge medications will be authorized once you are discharged, as it is imperative that you return to your primary care physician (or establish a relationship with a primary  care physician if you do not have one) for your aftercare needs so that they can reassess your need for medications and monitor your lab values.  You can reach the hospitalist office at phone [phone] or fax [phone]   If you do not have a primary care physician, you can call [phone] for a physician referral.  Activity: As tolerated with Full fall precautions use walker/cane & assistance as needed  Diet: heart healthy  Disposition Home  Atrial Fibrillation  Atrial fibrillation is a type of irregular or rapid heartbeat (arrhythmia). In atrial fibrillation, the heart quivers continuously in a chaotic pattern. This occurs when parts of the heart receive disorganized signals that make the heart unable to pump blood normally. This can increase the risk for stroke, heart failure, and other heart-related conditions. There are different types of atrial fibrillation, including:  Paroxysmal atrial fibrillation. This type starts suddenly, and it usually stops on its own shortly after it starts.  Persistent atrial fibrillation. This type often lasts longer than a week. It may stop on its own or with treatment.  Long-lasting persistent atrial fibrillation. This type lasts longer than 12 months.  Permanent atrial fibrillation. This type does not go away. Talk with your health care provider to learn about the type of atrial fibrillation that you have.  CAUSES  This condition is caused by some heart-related conditions or procedures, including:  A heart attack.  Coronary artery disease.  Heart failure.  Heart valve conditions.  High blood pressure.  Inflammation of the sac that surrounds the heart (pericarditis).  Heart surgery.  Certain heart rhythm disorders, such as Wolf-Parkinson-White syndrome. Other causes include:  Pneumonia.  Obstructive sleep apnea.  Blockage of an artery in the lungs (pulmonary embolism, or PE).  Lung cancer.  Chronic lung disease.  Thyroid problems, especially  if the thyroid is overactive (hyperthyroidism).  Caffeine.  Excessive alcohol use or illegal drug use.  Use of some medicines, including certain decongestants and diet pills. Sometimes, the cause cannot be found.  RISK FACTORS  This condition is more likely to develop in:  People who are older in age.  People who smoke.  People who have diabetes mellitus.  People who are overweight (obese).  Athletes who exercise vigorously. SYMPTOMS  Symptoms of this condition include:  A feeling that your heart is beating rapidly or irregularly.  A feeling of discomfort or pain in your chest.  Shortness of breath.  Sudden light-headedness or weakness.  Getting tired easily during exercise. In some cases, there are no symptoms.  DIAGNOSIS  Your health care provider may be able to detect atrial fibrillation when taking your pulse. If detected, this condition may be diagnosed with:  An electrocardiogram (ECG).  A Holter monitor test that records your heartbeat patterns over a 24-hour period.  Transthoracic echocardiogram (TTE) to evaluate how blood flows through your heart.  Transesophageal echocardiogram (TEE) to view more detailed images of your heart.  A stress test.  Imaging tests, such as a CT scan or chest X-ray.  Blood tests. TREATMENT  The main goals of treatment  are to prevent blood clots from forming and to keep your heart beating at a normal rate and rhythm. The type of treatment that you receive depends on many factors, such as your underlying medical conditions and how you feel when you are experiencing atrial fibrillation.  This condition may be treated with:  Medicine to slow down the heart rate, bring the heart's rhythm back to normal, or prevent clots from forming.  Electrical cardioversion. This is a procedure that resets your heart's rhythm by delivering a controlled, low-energy shock to the heart through your skin.  Different types of ablation, such as catheter ablation, catheter  ablation with pacemaker, or surgical ablation. These procedures destroy the heart tissues that send abnormal signals. When the pacemaker is used, it is placed under your skin to help your heart beat in a regular rhythm. HOME CARE INSTRUCTIONS  Take over-the counter and prescription medicines only as told by your health care provider.  If your health care provider prescribed a blood-thinning medicine (anticoagulant), take it exactly as told. Taking too much blood-thinning medicine can cause bleeding. If you do not take enough blood-thinning medicine, you will not have the protection that you need against stroke and other problems.  Do not use tobacco products, including cigarettes, chewing tobacco, and e-cigarettes. If you need help quitting, ask your health care provider.  If you have obstructive sleep apnea, manage your condition as told by your health care provider.  Do not drink alcohol.  Do not drink beverages that contain caffeine, such as coffee, soda, and tea.  Maintain a healthy weight. Do not use diet pills unless your health care provider approves. Diet pills may make heart problems worse.  Follow diet instructions as told by your health care provider.  Exercise regularly as told by your health care provider.  Keep all follow-up visits as told by your health care provider. This is important. PREVENTION  Avoid drinking beverages that contain caffeine or alcohol.  Avoid certain medicines, especially medicines that are used for breathing problems.  Avoid certain herbs and herbal medicines, such as those that contain ephedra or ginseng.  Do not use illegal drugs, such as cocaine and amphetamines.  Do not smoke.  Manage your high blood pressure. SEEK MEDICAL CARE IF:  You notice a change in the rate, rhythm, or strength of your heartbeat.  You are taking an anticoagulant and you notice increased bruising.  You tire more easily when you exercise or exert yourself. SEEK IMMEDIATE MEDICAL  CARE IF:  You have chest pain, abdominal pain, sweating, or weakness.  You feel nauseous.  You notice blood in your vomit, bowel movement, or urine.  You have shortness of breath.  You suddenly have swollen feet and ankles.  You feel dizzy.  You have sudden weakness or numbness of the face, arm, or leg, especially on one side of the body.  You have trouble speaking, trouble understanding, or both (aphasia).  Your face or your eyelid droops on one side. These symptoms may represent a serious problem that is an emergency. Do not wait to see if the symptoms will go away. Get medical help right away. Call your local emergency services (911 in the [place] Do not drive yourself to the hospital.  This information is not intended to replace advice given to you by your health care provider. Make sure you discuss any questions you have with your health care provider.  Document Released: 08/28/2005 Document Revised: 05/19/2015 Document Reviewed: 12/23/2014  Elsevier Interactive Patient Education  2016 [place].

## 2015-12-02 NOTE — Plan of Care (Signed)
Problem: Activity: Goal: Capacity to carry out activities will improve Outcome: Progressing Patient able to sit and stand so RN could complete orthostatic vitals this shift.  Patient denied dizziness during process.

## 2015-12-02 NOTE — Progress Notes (Signed)
Chaplain presented to the patient's room to provide spiritual care support and to provide an Advance Directive for  Completion.  The patient she had completed one before is another city, and having her husband as her POA.  Chaplain suggested that since several life factors had changed since she her last AD, a suggestion was made to have her complete [facility] Advance Directive.which she agreered to do.  The patient mention she might be discharged today and inquired whether she could bring it back to be notarized, Chaplain informed her to contact the [place] once completed. Chaplain offered a prayer of healing and wholeness for the patient, Chaplain Yaakov Guthrie [phone]

## 2015-12-02 NOTE — Progress Notes (Signed)
Discharge teaching and instructions reviewed. VSS. Pt discharging home via daughter.

## 2015-12-02 NOTE — Progress Notes (Signed)
Dr. Linna Darner aware patient on unit.  RN spoke with Dr. Marily Memos on the phone.  Dr. Marily Memos informed RN that patient's MRI/MRA results were back and did not show an acute stroke.  Dr. Marily Memos instructed RN to relay this information to patient.  RN relayed information to patient per MD instruction.

## 2015-12-06 ENCOUNTER — Telehealth: Payer: Self-pay | Admitting: Cardiology

## 2015-12-06 NOTE — Telephone Encounter (Signed)
New message:  Pt is calling in to speak with a nurse about a medication that was discarded temporarily while she was in the hospital. Please f/u with her

## 2015-12-06 NOTE — Telephone Encounter (Signed)
Advised patient to contact PCP about restarting Lisinopril (he started her on this). Informed that she should be on it for her heart, but may have to wait until BPs normalize more (can refer to last week ED visit for more info). She reports another similar episode on Friday, but BPs on Saturday were 125/73 & 116/66. Patient is agreeable to discussing with PCP and get his advisement on when to restart medication.

## 2016-01-07 ENCOUNTER — Telehealth: Payer: Self-pay | Admitting: Cardiology

## 2016-01-07 NOTE — Telephone Encounter (Signed)
Cheryl Vargas is calling because she would like to speak with the nurse about changing her appt , she did not want to change the appt w/ a scheduler , she wants to see what the doctor says . Please Call   Thanks

## 2016-01-07 NOTE — Telephone Encounter (Addendum)
Patient calls in telling me that she is no longer going to see [place].  She clarified that it was not because we did something wrong.  She states we were wonderful. She had recent hospitalization in which Metoprolol and Lisinopril were discontinued secondary to orthostatic hypotension.  States she hasn't taken Lisinopril since 3/23 and Metoprolol since 4/8. Says that she feels better than she has in a long time and has not had any issues with Afib.  She will continue taking Xarelto. States she is going to continue to see PCP and have him follow her for her needs at this time. Discussed with patient importance of the above named medications for her heart, if she is able to tolerate them.  Reviewed that her EF is improved but needs to be monitored yearly to ensure EF does not fall.  She is agreeable to discussing this with her PCP and having him follow up with echocardiograms and possibly restarting medications if tolerated. She will call office if she needs to be seen again. I will inform Dr. Curt Bears next week when he returns to office.

## 2016-01-14 NOTE — Telephone Encounter (Signed)
Dr. Curt Bears informed on 5/1

## 2016-01-17 ENCOUNTER — Ambulatory Visit: Payer: Medicare Other | Admitting: Cardiology

## 2016-04-11 ENCOUNTER — Telehealth: Payer: Self-pay | Admitting: Cardiology

## 2016-04-11 NOTE — Telephone Encounter (Signed)
Patient calling in complaining of being very fatigued lately and SOB. Made appt to see Dr. Curt Bears to discuss.  (see telephone note 4/28 - pt is not taking BB/ACEi) She thanks me for helping.

## 2016-04-11 NOTE — Telephone Encounter (Signed)
New message      Pt request to talk to the nurse----she said she will tell the nurse the problem.

## 2016-04-17 ENCOUNTER — Ambulatory Visit (INDEPENDENT_AMBULATORY_CARE_PROVIDER_SITE_OTHER): Payer: Medicare Other | Admitting: Cardiology

## 2016-04-17 ENCOUNTER — Encounter: Payer: Self-pay | Admitting: Cardiology

## 2016-04-17 VITALS — BP 110/70 | HR 79 | Ht 65.0 in | Wt 145.0 lb

## 2016-04-17 DIAGNOSIS — I4891 Unspecified atrial fibrillation: Secondary | ICD-10-CM

## 2016-04-17 DIAGNOSIS — I480 Paroxysmal atrial fibrillation: Secondary | ICD-10-CM

## 2016-04-17 MED ORDER — AMIODARONE HCL 200 MG PO TABS: ORAL_TABLET | ORAL | 11 refills | Status: DC

## 2016-04-17 NOTE — Addendum Note (Signed)
Addended by: Emmaline Life on: 04/17/2016 04:33 PM   Modules accepted: Orders

## 2016-04-17 NOTE — Patient Instructions (Signed)
Medication Instructions:  START Amiodarone 200 mg - take 1 pill TWICE daily for ONE WEEK then take 1 pill DAILY  Labwork: None Ordered   Testing/Procedures: None Ordered   Follow-Up: Your physician recommends that you schedule a follow-up appointment in: 3 months with Dr. Curt Bears   If you need a refill on your cardiac medications before your next appointment, please call your pharmacy.   Thank you for choosing [facility]! Christen Bame, RN [phone]

## 2016-04-17 NOTE — Progress Notes (Signed)
Electrophysiology Office Note   Date:  04/17/2016   ID:  Cheryl Vargas, DOB 1935-01-22, MRN [id number]  PCP:  Irven Shelling, MD Primary Electrophysiologist:  Constance Haw, MD    Chief Complaint  Patient presents with  . Follow-up  . Shortness of Breath  . Fatigue  . Chest Pain     History of Present Illness: Cheryl Vargas is a 81 y.o. female who presents today for electrophysiology evaluation.   She presented to the emergency room on 10/18/15 with palpitations, diagnosed with atrial fibrillation.   Today, she denies symptoms of chest pain, orthopnea, PND, lower extremity edema, claudication, dizziness, presyncope, syncope, bleeding, or neurologic sequela. The patient is tolerating medications without difficulties. With the past few weeks, she has noticed increasing palpitations and fatigue. She says that there are days that it is difficult for her to do very much at all. On these days, she says that she has more severe palpitations and she previously had. She knows no exacerbating or alleviating factors. When she is feeling bad, she also has associated dyspnea on exertion.   Past Medical History:  Diagnosis Date  . Arthritis   . Dysrhythmia    pvc's per pt  . Enlarged heart   . PONV (postoperative nausea and vomiting)    Past Surgical History:  Procedure Laterality Date  . BUNIONECTOMY    . CARDIAC CATHETERIZATION     in 2004 at [facility]. "Insignificant blockage" per patient  . SHOULDER SURGERY     closed reduction  . TONSILLECTOMY    . TOTAL HIP ARTHROPLASTY Left 03/18/2013   Procedure: TOTAL HIP ARTHROPLASTY ANTERIOR APPROACH;  Surgeon: Mauri Pole, MD;  Location: [facility];  Service: Orthopedics;  Laterality: Left;  . TUBAL LIGATION       Current Outpatient Prescriptions  Medication Sig Dispense Refill  . acetaminophen (TYLENOL) 500 MG tablet Take 500 mg by mouth every 6 (six) hours as needed for moderate pain.    Marland Kitchen esomeprazole (NEXIUM) 20 MG  packet Take 20 mg by mouth daily before breakfast.    . Rivaroxaban (XARELTO) 15 MG TABS tablet Take 1 tablet (15 mg total) by mouth daily with supper. 30 tablet 3  . zolpidem (AMBIEN) 5 MG tablet Take 5 mg by mouth at bedtime.     Marland Kitchen amiodarone (PACERONE) 200 MG tablet Take 1 tablet twice daily for 1 week and then once daily. 30 tablet 11   No current facility-administered medications for this visit.     Allergies:   Alendronate sodium   Social History:  The patient  reports that she has never smoked. She has never used smokeless tobacco. She reports that she drinks about 1.8 oz of alcohol per week . She reports that she does not use drugs.   Family History:  The patient's family history includes Heart attack in her father; Lung cancer in her mother.    ROS:  Please see the history of present illness.   Otherwise, review of systems is positive for weakness, fatigue, appetite change, chest pressure, palpitations, cough, dyspnea on exertion, constipation, nausea, diarrhea.   All other systems are reviewed and negative.    PHYSICAL EXAM: VS:  BP 110/70   Pulse 79   Ht 5\' 5"  (1.651 m)   Wt 145 lb (65.8 kg)   BMI 24.13 kg/m  , BMI Body mass index is 24.13 kg/m. GEN: Well nourished, well developed, in no acute distress  HEENT: normal  Neck: no JVD, carotid bruits,  or masses Cardiac: irregular; no murmurs, rubs, or gallops,no edema  Respiratory:  clear to auscultation bilaterally, normal work of breathing GI: soft, nontender, nondistended, + BS MS: no deformity or atrophy  Skin: warm and dry Neuro:  Strength and sensation are intact Psych: euthymic mood, full affect  EKG:  EKG is ordered today. Personal review of the ekg ordered shows Sinus rhythm, rate 79, occasional PVCs, atypical RBBB  Recent Labs: 10/18/2015: B Natriuretic Peptide 536.7; TSH 5.368 12/02/2015: ALT 12; BUN 7; Creatinine, Ser 0.77; Hemoglobin 13.6; Platelets 170; Potassium 3.7; Sodium 140    Lipid Panel  No  results found for: CHOL, TRIG, HDL, CHOLHDL, VLDL, LDLCALC, LDLDIRECT   Wt Readings from Last 3 Encounters:  04/17/16 145 lb (65.8 kg)  12/02/15 147 lb 11.2 oz (67 kg)  10/20/15 149 lb (67.6 kg)      Other studies Reviewed: Additional studies/ records that were reviewed today include: TTE 2014  Review of the above records today demonstrates:  - Left ventricle: The cavity size was normal. Systolic function was moderately to severely reduced. The estimated ejection fraction was in the range of 30% to 35%. Wall motion was normal; there were no regional wall motion abnormalities. There was an increased relative contribution of atrial contraction to ventricular filling. Doppler parameters are consistent with abnormal left ventricular relaxation (grade 1 diastolic dysfunction). - Aortic valve: Mild regurgitation. - Mitral valve: Mild regurgitation. - Pulmonary arteries: PA peak pressure: 51mm Hg (S).   ASSESSMENT AND PLAN:  1.  Paroxysmal atrial fibrillation: Has CHADS2VASc of 5. Was started on Xarelto for anticoagulation with a previous rate control strategy as she had one episode of AF. She is currently having symptoms including fatigue, palpitations and shortness of breath, which could be related to atrial fibrillation. Due to that, we'll start her on amiodarone 200 mg twice a day for a week then 200 mg a day.  This patients CHA2DS2-VASc Score and unadjusted Ischemic Stroke Rate (% per year) is equal to 7.2 % stroke rate/year from a score of 5  Above score calculated as 1 point each if present [CHF, HTN, DM, Vascular=MI/PAD/Aortic Plaque, Age if 65-74, or Female] Above score calculated as 2 points each if present [Age > 75, or Stroke/TIA/TE]  2. PVCs: He continued to have PVCs, which could be part of her constellation of symptoms. Should improve with addition of amiodarone  Current medicines are reviewed at length with the patient today.   The patient does not have  concerns regarding her medicines.  The following changes were made today:  Add amiodarone  Labs/ tests ordered today include:  Orders Placed This Encounter  Procedures  . EKG 12-Lead   Disposition:   FU with Shiven Junious 3 months  Signed, Gerturde Kuba Meredith Leeds, MD  04/17/2016 4:27 PM     [place] [address] [place] [address] [phone] (office) [phone] (fax)

## 2016-04-23 ENCOUNTER — Encounter (HOSPITAL_COMMUNITY): Payer: Self-pay | Admitting: Emergency Medicine

## 2016-04-23 ENCOUNTER — Observation Stay (HOSPITAL_COMMUNITY)
Admission: EM | Admit: 2016-04-23 | Discharge: 2016-04-26 | Disposition: A | Discharge status: Home / Self Care | Payer: Medicare Other | Attending: Cardiology | Admitting: Cardiology

## 2016-04-23 ENCOUNTER — Emergency Department (HOSPITAL_COMMUNITY): Payer: Medicare Other

## 2016-04-23 DIAGNOSIS — M1990 Unspecified osteoarthritis, unspecified site: Secondary | ICD-10-CM | POA: Insufficient documentation

## 2016-04-23 DIAGNOSIS — I110 Hypertensive heart disease with heart failure: Secondary | ICD-10-CM | POA: Diagnosis not present

## 2016-04-23 DIAGNOSIS — I509 Heart failure, unspecified: Secondary | ICD-10-CM

## 2016-04-23 DIAGNOSIS — I952 Hypotension due to drugs: Secondary | ICD-10-CM | POA: Diagnosis not present

## 2016-04-23 DIAGNOSIS — K5900 Constipation, unspecified: Secondary | ICD-10-CM | POA: Diagnosis not present

## 2016-04-23 DIAGNOSIS — E785 Hyperlipidemia, unspecified: Secondary | ICD-10-CM | POA: Diagnosis not present

## 2016-04-23 DIAGNOSIS — I5043 Acute on chronic combined systolic (congestive) and diastolic (congestive) heart failure: Secondary | ICD-10-CM | POA: Diagnosis not present

## 2016-04-23 DIAGNOSIS — I10 Essential (primary) hypertension: Secondary | ICD-10-CM | POA: Diagnosis present

## 2016-04-23 DIAGNOSIS — Z79899 Other long term (current) drug therapy: Secondary | ICD-10-CM | POA: Insufficient documentation

## 2016-04-23 DIAGNOSIS — I429 Cardiomyopathy, unspecified: Secondary | ICD-10-CM | POA: Insufficient documentation

## 2016-04-23 DIAGNOSIS — I480 Paroxysmal atrial fibrillation: Secondary | ICD-10-CM | POA: Diagnosis not present

## 2016-04-23 DIAGNOSIS — I5042 Chronic combined systolic (congestive) and diastolic (congestive) heart failure: Secondary | ICD-10-CM | POA: Diagnosis present

## 2016-04-23 DIAGNOSIS — R1010 Upper abdominal pain, unspecified: Secondary | ICD-10-CM

## 2016-04-23 DIAGNOSIS — Z7901 Long term (current) use of anticoagulants: Secondary | ICD-10-CM | POA: Diagnosis not present

## 2016-04-23 HISTORY — DX: Chronic combined systolic (congestive) and diastolic (congestive) heart failure: I50.42

## 2016-04-23 LAB — COMPREHENSIVE METABOLIC PANEL
ALT: 23 U/L (ref 14–54)
AST: 28 U/L (ref 15–41)
Albumin: 3.5 g/dL (ref 3.5–5.0)
Alkaline Phosphatase: 66 U/L (ref 38–126)
Anion gap: 10 (ref 5–15)
BUN: 10 mg/dL (ref 6–20)
CO2: 20 mmol/L — ABNORMAL LOW (ref 22–32)
Calcium: 9.2 mg/dL (ref 8.9–10.3)
Chloride: 106 mmol/L (ref 101–111)
Creatinine, Ser: 0.88 mg/dL (ref 0.44–1.00)
GFR calc Af Amer: >60 mL/min (ref >60)
GFR calc non Af Amer: >60 mL/min (ref >60)
Glucose, Bld: 122 mg/dL — ABNORMAL HIGH (ref 65–99)
Potassium: 3.7 mmol/L (ref 3.5–5.1)
Sodium: 136 mmol/L (ref 135–145)
Total Bilirubin: 0.9 mg/dL (ref 0.3–1.2)
Total Protein: 6.6 g/dL (ref 6.5–8.1)

## 2016-04-23 LAB — I-STAT TROPONIN, ED: Troponin i, poc: 0.02 ng/mL (ref 0.00–0.08)

## 2016-04-23 LAB — URINALYSIS, ROUTINE W REFLEX MICROSCOPIC
Bilirubin Urine: NEGATIVE
Glucose, UA: NEGATIVE mg/dL
Ketones, ur: NEGATIVE mg/dL
Nitrite: NEGATIVE
Protein, ur: NEGATIVE mg/dL
Specific Gravity, Urine: 1.016 (ref 1.005–1.030)
pH: 5 (ref 5.0–8.0)

## 2016-04-23 LAB — CBC
HCT: 43.2 % (ref 36.0–46.0)
Hemoglobin: 15.1 g/dL — ABNORMAL HIGH (ref 12.0–15.0)
MCH: 31.1 pg (ref 26.0–34.0)
MCHC: 35 g/dL (ref 30.0–36.0)
MCV: 88.9 fL (ref 78.0–100.0)
Platelets: 229 10*3/uL (ref 150–400)
RBC: 4.86 MIL/uL (ref 3.87–5.11)
RDW: 12.7 % (ref 11.5–15.5)
WBC: 7.7 10*3/uL (ref 4.0–10.5)

## 2016-04-23 LAB — URINE MICROSCOPIC-ADD ON

## 2016-04-23 LAB — BRAIN NATRIURETIC PEPTIDE: B Natriuretic Peptide: 1592.6 pg/mL — ABNORMAL HIGH (ref 0.0–100.0)

## 2016-04-23 LAB — LIPASE, BLOOD: Lipase: 20 U/L (ref 11–51)

## 2016-04-23 MED ORDER — ONDANSETRON 4 MG PO TBDP
4.0000 mg | ORAL_TABLET | Freq: Once | ORAL | Status: AC | PRN
  Administered 2016-04-23: 4 mg via ORAL

## 2016-04-23 MED ORDER — NITROGLYCERIN 2 % TD OINT
1.0000 [in_us] | TOPICAL_OINTMENT | Freq: Once | TRANSDERMAL | Status: AC
  Administered 2016-04-23: 1 [in_us] via TOPICAL
  Filled 2016-04-23: qty 1

## 2016-04-23 MED ORDER — FUROSEMIDE 10 MG/ML IJ SOLN
40.0000 mg | Freq: Once | INTRAMUSCULAR | Status: AC
  Administered 2016-04-23: 40 mg via INTRAVENOUS
  Filled 2016-04-23: qty 4

## 2016-04-23 MED ORDER — METOCLOPRAMIDE HCL 5 MG/ML IJ SOLN
5.0000 mg | Freq: Once | INTRAMUSCULAR | Status: AC
  Administered 2016-04-23: 5 mg via INTRAVENOUS
  Filled 2016-04-23: qty 2

## 2016-04-23 MED ORDER — ONDANSETRON 4 MG PO TBDP
ORAL_TABLET | ORAL | Status: AC
  Filled 2016-04-23: qty 1

## 2016-04-23 NOTE — ED Provider Notes (Addendum)
Complains of epigastric pain and lower chest pain onset approximately week ago. Accompanied by progressively worsening creasing shortness of breath shortness of breath is worse with lying supine. She was seen by her cardiologist 6 days ago and started on amiodarone. Other associated symptoms include nausea. On exam in no distress lungs. HEENT exam no facial asymmetry neck supple positive JVD lungs Rales at bases bilaterally no respiratory distress heart regular rate and rhythm abdomen nondistended nontender extremities without edema   Orlie Dakin, MD 04/23/16 2241 ED ECG REPORT   Date: 04/24/2016  Rate: 75  Rhythm: normal sinus rhythm  QRS Axis: right  Intervals: normal  ST/T Wave abnormalities: nonspecific T wave changes  Conduction Disutrbances:nonspecific intraventricular conduction delay  Narrative Interpretation:   Old EKG Reviewed: unchanged  I have personally reviewed the EKG tracing and disagree with the computerized printout as noted. TRACING SHOWS RAD, NOT LAD.   Orlie Dakin, MD 04/24/16 [phone]

## 2016-04-23 NOTE — ED Notes (Signed)
MD at bedside. 

## 2016-04-23 NOTE — ED Notes (Signed)
Pt back from XR in no apparent distress  

## 2016-04-23 NOTE — ED Triage Notes (Signed)
Pt sts upper abd pain and N/V x 1 week

## 2016-04-23 NOTE — ED Provider Notes (Signed)
[facility] DEPT Provider Note   CSN: [id number] Arrival date & time: 04/23/16  [id number]  First Provider Contact:  First MD Initiated Contact with Patient 04/23/16 2019        History   Chief Complaint Chief Complaint  Patient presents with  . Abdominal Pain    HPI Cheryl Vargas is a 81 y.o. female.  HPI   81 year old female with history of atrial fibrillation on Xarelto, hypertension, CHF presenting to ED with complaints of abdominal pain. Patient report she was diagnosed with atrial fibrillation back in February and was placed on beta blocker and ACE inhibitor for management of arrhythmia. She was also placed on Xarelto. She was noted to have hypotension due to medication have been complaining of generalized weakness due to it. She was seen by her doctor 5 days ago for blood pressure management and was placed on amiodarone 200 mg twice daily. She is supposed to continue taking amiodarone for 7 days and then decrease it to 200 mg daily. She reportedly has had generalized fatigue and nausea intermittently for the past 3 weeks but worsening within the past 5 days since been on the amiodarone. She endorse having exertional shortness of breath, chest discomfort, occasional nonproductive cough, with dull achy upper abdominal pain. Reports nausea without vomiting. Has had increased bowel movement was small caliber stools at least 7-8 times per day. She felt the symptoms to the medication. She is scheduled to follow-up with her primary care provider in September. She is here for further management. Otherwise patient denies having fever, chills, headache, neck pain, back pain, dysuria, hematuria, rash, focal numbness or weakness.  Past Medical History:  Diagnosis Date  . Arthritis   . Dysrhythmia    pvc's per pt  . Enlarged heart   . PONV (postoperative nausea and vomiting)     Patient Active Problem List   Diagnosis Date Noted  . Atrial fibrillation ([place]) 12/01/2015  . Dizziness  12/01/2015  . Hypertension 12/01/2015  . Insomnia 12/01/2015  . Chronic combined systolic and diastolic CHF (congestive heart failure) ([place]) 12/01/2015  . Closed left hip fracture ([place]) 03/17/2013  . Cardiomegaly 03/17/2013  . PVC (premature ventricular contraction) 03/17/2013  . Hyperlipidemia 03/17/2013    Past Surgical History:  Procedure Laterality Date  . BUNIONECTOMY    . CARDIAC CATHETERIZATION     in 2004 at [facility]. "Insignificant blockage" per patient  . SHOULDER SURGERY     closed reduction  . TONSILLECTOMY    . TOTAL HIP ARTHROPLASTY Left 03/18/2013   Procedure: TOTAL HIP ARTHROPLASTY ANTERIOR APPROACH;  Surgeon: Mauri Pole, MD;  Location: [facility] ORS;  Service: Orthopedics;  Laterality: Left;  . TUBAL LIGATION      OB History    No data available       Home Medications    Prior to Admission medications   Medication Sig Start Date End Date Taking? Authorizing Provider  acetaminophen (TYLENOL) 500 MG tablet Take 500 mg by mouth every 6 (six) hours as needed for moderate pain.    Historical Provider, MD  amiodarone (PACERONE) 200 MG tablet Take 1 tablet twice daily for 1 week and then once daily. 04/17/16   Will Meredith Leeds, MD  esomeprazole (NEXIUM) 20 MG packet Take 20 mg by mouth daily before breakfast.    Historical Provider, MD  Rivaroxaban (XARELTO) 15 MG TABS tablet Take 1 tablet (15 mg total) by mouth daily with supper. 10/20/15   Will Meredith Leeds, MD  zolpidem (AMBIEN) 5 MG  tablet Take 5 mg by mouth at bedtime.     Historical Provider, MD    Family History Family History  Problem Relation Age of Onset  . Lung cancer Mother   . Heart attack Father     Social History Social History  Substance Use Topics  . Smoking status: Never Smoker  . Smokeless tobacco: Never Used  . Alcohol use 1.8 oz/week    3 Glasses of wine per week     Comment: per week     Allergies   Alendronate sodium   Review of Systems Review of Systems  All other systems  reviewed and are negative.    Physical Exam Updated Vital Signs BP 134/74 (BP Location: Right Arm)   Pulse 79   Temp 97.7 F (36.5 C) (Oral)   Resp 16   SpO2 95%   Physical Exam  Constitutional: She is oriented to person, place, and time. She appears well-developed and well-nourished. No distress.  Elderly female, well appearing in no acute discomfort.  HENT:  Head: Atraumatic.  Eyes: Conjunctivae are normal.  Neck: Neck supple. No JVD present.  Cardiovascular: Normal rate, regular rhythm and intact distal pulses.   Pulmonary/Chest: Effort normal. She has rales.  Faint crackles at lung bases  Abdominal: Soft. She exhibits no distension. There is no tenderness.  Musculoskeletal: Normal range of motion. She exhibits no edema or tenderness.  Neurological: She is alert and oriented to person, place, and time. She has normal strength. No cranial nerve deficit or sensory deficit. GCS eye subscore is 4. GCS verbal subscore is 5. GCS motor subscore is 6.  Skin: Capillary refill takes less than 2 seconds. No rash noted.  Psychiatric: She has a normal mood and affect.  Nursing note and vitals reviewed.    ED Treatments / Results  Labs (all labs ordered are listed, but only abnormal results are displayed) Labs Reviewed  COMPREHENSIVE METABOLIC PANEL - Abnormal; Notable for the following:       Result Value   CO2 20 (*)    Glucose, Bld 122 (*)    All other components within normal limits  CBC - Abnormal; Notable for the following:    Hemoglobin 15.1 (*)    All other components within normal limits  URINALYSIS, ROUTINE W REFLEX MICROSCOPIC (NOT AT [facility]) - Abnormal; Notable for the following:    APPearance CLOUDY (*)    Hgb urine dipstick SMALL (*)    Leukocytes, UA MODERATE (*)    All other components within normal limits  URINE MICROSCOPIC-ADD ON - Abnormal; Notable for the following:    Squamous Epithelial / LPF 0-5 (*)    Bacteria, UA RARE (*)    All other components within  normal limits  BRAIN NATRIURETIC PEPTIDE - Abnormal; Notable for the following:    B Natriuretic Peptide 1,592.6 (*)    All other components within normal limits  LIPASE, BLOOD  BASIC METABOLIC PANEL  I-STAT TROPOININ, ED    EKG  EKG Interpretation None       Radiology Dg Abd Acute W/chest  Result Date: 04/23/2016 CLINICAL DATA:  Pt c/o SOB, epigastric abdominal pain, nausea, and constipation x 1 week. Hx of AFIB AND an impaction. EXAM: DG ABDOMEN ACUTE W/ 1V CHEST COMPARISON:  Chest 10/18/2015 FINDINGS: Mild cardiac enlargement with prominent interstitial markings in the lungs, suggesting interstitial edema or infiltration. Small bilateral pleural effusions. Atelectasis in the lung bases. Calcification of the aorta. No pneumothorax. IMPRESSION: Cardiac enlargement with small bilateral pleural  effusions. Interstitial edema or infiltration. Consider congestive failure. Electronically Signed   By: Lucienne Capers M.D.   On: 04/23/2016 22:06    Procedures Procedures (including critical care time)  Medications Ordered in ED Medications  amiodarone (PACERONE) tablet 200 mg (not administered)  zolpidem (AMBIEN) tablet 5 mg (not administered)  sodium chloride flush (NS) 0.9 % injection 3 mL (not administered)  sodium chloride flush (NS) 0.9 % injection 3 mL (not administered)  0.9 %  sodium chloride infusion (not administered)  acetaminophen (TYLENOL) tablet 650 mg (not administered)  ondansetron (ZOFRAN) injection 4 mg (not administered)  furosemide (LASIX) injection 40 mg (not administered)  Rivaroxaban (XARELTO) tablet 15 mg (not administered)  ondansetron (ZOFRAN-ODT) disintegrating tablet 4 mg (4 mg Oral Given 04/23/16 1832)  furosemide (LASIX) injection 40 mg (40 mg Intravenous Given 04/23/16 2315)  metoCLOPramide (REGLAN) injection 5 mg (5 mg Intravenous Given 04/23/16 2311)  nitroGLYCERIN (NITROGLYN) 2 % ointment 1 inch (1 inch Topical Given 04/23/16 2309)     Initial  Impression / Assessment and Plan / ED Course  I have reviewed the triage vital signs and the nursing notes.  Pertinent labs & imaging results that were available during my care of the patient were reviewed by me and considered in my medical decision making (see chart for details).  Clinical Course    BP 121/74   Pulse 77   Temp 97.7 F (36.5 C) (Oral)   Resp 24   SpO2 95%    Final Clinical Impressions(s) / ED Diagnoses   Final diagnoses:  CHF (congestive heart failure), NYHA class I, unspecified failure chronicity, unspecified type (HCC)    New Prescriptions New Prescriptions   No medications on file   9:22 PM Patient here with complaints of generalized fatigue, having shortness of breath, upper abdominal pain and nausea intermittently for the past several weeks worsening when she was switch to amiodarone. Does have hx of CHF.  Lung sounds wet.  Will check CXR, and BNP.  Work up initiated. Care discussed with Dr. Winfred Leeds  10:20 PM CXR with bilateral pleural effusion.  BNP is elevated at 1,592.  Finding concerning for CHF exacerbation.  Will start pt on Lasix and consider admission.  Will place nitro paste 1 inch as well.    11:13 PM Appreciate consultation from [place], Dr. Milbert Coulter who agrees to see pt in the ER and will admit for CHF exacerbation.  Pt is aware of plan.  BP currently stable.  ECG without acute ischemic changes.    Domenic Moras, PA-C 04/24/16 [place], MD 04/24/16 [phone]

## 2016-04-24 DIAGNOSIS — I5043 Acute on chronic combined systolic (congestive) and diastolic (congestive) heart failure: Secondary | ICD-10-CM | POA: Diagnosis not present

## 2016-04-24 DIAGNOSIS — I509 Heart failure, unspecified: Secondary | ICD-10-CM

## 2016-04-24 LAB — BASIC METABOLIC PANEL
Anion gap: 9 (ref 5–15)
BUN: 8 mg/dL (ref 6–20)
CO2: 29 mmol/L (ref 22–32)
Calcium: 9.3 mg/dL (ref 8.9–10.3)
Chloride: 101 mmol/L (ref 101–111)
Creatinine, Ser: 0.95 mg/dL (ref 0.44–1.00)
GFR calc Af Amer: >60 mL/min (ref >60)
GFR calc non Af Amer: 56 mL/min — ABNORMAL LOW (ref >60)
Glucose, Bld: 108 mg/dL — ABNORMAL HIGH (ref 65–99)
Potassium: 4 mmol/L (ref 3.5–5.1)
Sodium: 139 mmol/L (ref 135–145)

## 2016-04-24 LAB — PROTIME-INR
INR: 1.25
Prothrombin Time: 15.8 seconds — ABNORMAL HIGH (ref 11.4–15.2)

## 2016-04-24 MED ORDER — RIVAROXABAN 15 MG PO TABS
15.0000 mg | ORAL_TABLET | Freq: Every day | ORAL | Status: DC
  Filled 2016-04-24: qty 1

## 2016-04-24 MED ORDER — SODIUM CHLORIDE 0.9 % IV SOLN: 250.0000 mL | INTRAVENOUS | Status: DC | PRN

## 2016-04-24 MED ORDER — ONDANSETRON HCL 4 MG/2ML IJ SOLN: 4.0000 mg | Freq: Four times a day (QID) | INTRAMUSCULAR | Status: DC | PRN

## 2016-04-24 MED ORDER — FUROSEMIDE 10 MG/ML IJ SOLN
40.0000 mg | Freq: Every day | INTRAMUSCULAR | Status: DC
  Administered 2016-04-24: 40 mg via INTRAVENOUS
  Filled 2016-04-24: qty 4

## 2016-04-24 MED ORDER — SODIUM CHLORIDE 0.9% FLUSH
3.0000 mL | Freq: Two times a day (BID) | INTRAVENOUS | Status: DC
  Administered 2016-04-24 – 2016-04-25 (×3): 3 mL via INTRAVENOUS

## 2016-04-24 MED ORDER — ZOLPIDEM TARTRATE 5 MG PO TABS
5.0000 mg | ORAL_TABLET | Freq: Every day | ORAL | Status: DC
  Administered 2016-04-24 – 2016-04-25 (×2): 5 mg via ORAL
  Filled 2016-04-24 (×2): qty 1

## 2016-04-24 MED ORDER — RIVAROXABAN 15 MG PO TABS: 15.0000 mg | ORAL_TABLET | Freq: Every day | ORAL | Status: DC

## 2016-04-24 MED ORDER — ACETAMINOPHEN 325 MG PO TABS: 650.0000 mg | ORAL_TABLET | ORAL | Status: DC | PRN

## 2016-04-24 MED ORDER — SODIUM CHLORIDE 0.9% FLUSH: 3.0000 mL | INTRAVENOUS | Status: DC | PRN

## 2016-04-24 MED ORDER — AMIODARONE HCL 200 MG PO TABS
200.0000 mg | ORAL_TABLET | Freq: Every day | ORAL | Status: DC
  Administered 2016-04-24: 200 mg via ORAL
  Filled 2016-04-24 (×2): qty 1

## 2016-04-24 MED ORDER — FUROSEMIDE 10 MG/ML IJ SOLN
40.0000 mg | Freq: Once | INTRAMUSCULAR | Status: AC
  Administered 2016-04-24: 40 mg via INTRAVENOUS
  Filled 2016-04-24: qty 4

## 2016-04-24 NOTE — H&P (Signed)
Primary cardiologist: Dr. Curt Bears C.c shortness of breath and abd fullness HPI: 81 y/o woman with pmh of htn, hlp, PAF CHADSVASC = 5  On xarelto, chronic systolic and diastolic CHF, EF [id number] came to ed with complaints of difficulty breathing and abd full ness with some nausea that started today. Accompanied by progressively worsening creasing shortness of breath shortness of breath is worse with lying supine. She was seen by her cardiologist 6 days ago and started on amiodarone. Other associated symptoms include nausea. She had called earlier today and I suggested to [facility] ED. Compliant with meds  Review of Systems:     Cardiac Review of Systems: {Y] = yes [ ]  = no  Chest Pain [    ]  Resting SOB [  y ] Exertional SOB  [ y ]  Orthopnea [ y ]   Pedal Edema [   ]    Palpitations [  ] Syncope  [  ]   Presyncope [   ]  General Review of Systems: [Y] = yes [  ]=no Constitional: recent weight change [  ]; anorexia [  ]; fatigue [  ]; nausea [  ]; night sweats [  ]; fever [  ]; or chills [  ];                                                                     Dental: poor dentition[  ];   Eye : blurred vision [  ]; diplopia [   ]; vision changes [  ];  Amaurosis fugax[  ]; Resp: cough [  ];  wheezing[  ];  hemoptysis[  ]; shortness of breath[  ]; paroxysmal nocturnal dyspnea[  ]; dyspnea on exertion[  ]; or orthopnea[  ];  GI:  gallstones[  ], vomiting[  ];  dysphagia[  ]; melena[  ];  hematochezia [  ]; heartburn[  ];   GU: kidney stones [  ]; hematuria[  ];   dysuria [  ];  nocturia[  ];               Skin: rash [  ], swelling[  ];, hair loss[  ];  peripheral edema[  ];  or itching[  ]; Musculosketetal: myalgias[  ];  joint swelling[  ];  joint erythema[  ];  joint pain[  ];  back pain[  ];  Heme/Lymph: bruising[  ];  bleeding[  ];  anemia[  ];  Neuro: TIA[  ];  headaches[  ];  stroke[  ];  vertigo[  ];  seizures[  ];   paresthesias[  ];  difficulty walking[  ];  Psych:depression[  ]; anxiety[   ];  Endocrine: diabetes[  ];  thyroid dysfunction[  ];  Other:  Past Medical History:  Diagnosis Date  . Arthritis   . Dysrhythmia    pvc's per pt  . Enlarged heart   . PONV (postoperative nausea and vomiting)     @[facility] @   Allergies  Allergen Reactions  . Alendronate Sodium Other (See Comments)    Arm pain    Social History   Social History  . Marital status: Widowed    Spouse name: N/A  . Number of children: N/A  . Years of  education: N/A   Occupational History  . Not on file.   Social History Main Topics  . Smoking status: Never Smoker  . Smokeless tobacco: Never Used  . Alcohol use 1.8 oz/week    3 Glasses of wine per week     Comment: per week  . Drug use: No  . Sexual activity: Not on file   Other Topics Concern  . Not on file   Social History Narrative  . No narrative on file    Family History  Problem Relation Age of Onset  . Lung cancer Mother   . Heart attack Father     PHYSICAL EXAM: Vitals:   04/23/16 2216 04/24/16 0000  BP: 139/76 121/74  Pulse: 78 77  Resp: 16 24  Temp:     General:  Well appearing. No respiratory difficulty HEENT: normal Neck: supple. no JVD. Carotids 2+ bilat; no bruits. No lymphadenopathy or thryomegaly appreciated. Cor: PMI nondisplaced. Regular rate & rhythm. No rubs, gallops or murmurs. Lungs: bilateral rales + Abdomen: soft, nontender, nondistended. No hepatosplenomegaly. No bruits or masses Extremities: no cyanosis, clubbing, rash, edema Neuro: alert & oriented x 3, cranial nerves grossly intact. moves all 4 extremities w/o difficulty. Affect pleasant.  ECG:  Results for orders placed or performed during the hospital encounter of 04/23/16 (from the past 24 hour(s))  Lipase, blood     Status: None   Collection Time: 04/23/16  6:35 PM  Result Value Ref Range   Lipase 20 11 - 51 U/L  Comprehensive metabolic panel     Status: Abnormal   Collection Time: 04/23/16  6:35 PM  Result Value Ref Range    Sodium 136 135 - 145 mmol/L   Potassium 3.7 3.5 - 5.1 mmol/L   Chloride 106 101 - 111 mmol/L   CO2 20 (L) 22 - 32 mmol/L   Glucose, Bld 122 (H) 65 - 99 mg/dL   BUN 10 6 - 20 mg/dL   Creatinine, Ser 0.88 0.44 - 1.00 mg/dL   Calcium 9.2 8.9 - 10.3 mg/dL   Total Protein 6.6 6.5 - 8.1 g/dL   Albumin 3.5 3.5 - 5.0 g/dL   AST 28 15 - 41 U/L   ALT 23 14 - 54 U/L   Alkaline Phosphatase 66 38 - 126 U/L   Total Bilirubin 0.9 0.3 - 1.2 mg/dL   GFR calc non Af Amer >60 >60 mL/min   GFR calc Af Amer >60 >60 mL/min   Anion gap 10 5 - 15  CBC     Status: Abnormal   Collection Time: 04/23/16  6:35 PM  Result Value Ref Range   WBC 7.7 4.0 - 10.5 K/uL   RBC 4.86 3.87 - 5.11 MIL/uL   Hemoglobin 15.1 (H) 12.0 - 15.0 g/dL   HCT 43.2 36.0 - 46.0 %   MCV 88.9 78.0 - 100.0 fL   MCH 31.1 26.0 - 34.0 pg   MCHC 35.0 30.0 - 36.0 g/dL   RDW 12.7 11.5 - 15.5 %   Platelets 229 150 - 400 K/uL  Brain natriuretic peptide     Status: Abnormal   Collection Time: 04/23/16  6:35 PM  Result Value Ref Range   B Natriuretic Peptide 1,592.6 (H) 0.0 - 100.0 pg/mL  Urinalysis, Routine w reflex microscopic     Status: Abnormal   Collection Time: 04/23/16  8:08 PM  Result Value Ref Range   Color, Urine YELLOW YELLOW   APPearance CLOUDY (A) CLEAR   Specific Gravity, Urine  1.016 1.005 - 1.030   pH 5.0 5.0 - 8.0   Glucose, UA NEGATIVE NEGATIVE mg/dL   Hgb urine dipstick SMALL (A) NEGATIVE   Bilirubin Urine NEGATIVE NEGATIVE   Ketones, ur NEGATIVE NEGATIVE mg/dL   Protein, ur NEGATIVE NEGATIVE mg/dL   Nitrite NEGATIVE NEGATIVE   Leukocytes, UA MODERATE (A) NEGATIVE  Urine microscopic-add on     Status: Abnormal   Collection Time: 04/23/16  8:08 PM  Result Value Ref Range   Squamous Epithelial / LPF 0-5 (A) NONE SEEN   WBC, UA 0-5 0 - 5 WBC/hpf   RBC / HPF 0-5 0 - 5 RBC/hpf   Bacteria, UA RARE (A) NONE SEEN  I-stat troponin, ED     Status: None   Collection Time: 04/23/16  8:23 PM  Result Value Ref Range    Troponin i, poc 0.02 0.00 - 0.08 ng/mL   Comment 3           Dg Abd Acute W/chest  Result Date: 04/23/2016 CLINICAL DATA:  Pt c/o SOB, epigastric abdominal pain, nausea, and constipation x 1 week. Hx of AFIB AND an impaction. EXAM: DG ABDOMEN ACUTE W/ 1V CHEST COMPARISON:  Chest 10/18/2015 FINDINGS: Mild cardiac enlargement with prominent interstitial markings in the lungs, suggesting interstitial edema or infiltration. Small bilateral pleural effusions. Atelectasis in the lung bases. Calcification of the aorta. No pneumothorax. IMPRESSION: Cardiac enlargement with small bilateral pleural effusions. Interstitial edema or infiltration. Consider congestive failure. Electronically Signed   By: Lucienne Capers M.D.   On: 04/23/2016 22:06     ASSESSMENT: Principal Problem:   Acute on chronic combined systolic (congestive) and diastolic (congestive) heart failure (HCC)  will start lasix  CHF orderset Echo in am to assess EF  will need guideline directed medical therapy with bb/acei  Active Problems:   Hyperlipidemia   Atrial fibrillation ([place]): continue amiodarone and xarelto, CHADSVASC2 score= 5   Hypertension   CHF (congestive heart failure), NYHA class IV ([place])    PLAN/DISCUSSION: 1. Admit under obs on tele 2. Lasix 40 mg IV daily 3. Echo to assess EF 4. Will start low dose coreg 3.125 mg BID and uptitrate, will need acei if BP can tolerate 5. contine xarelto and amiodarone,  6. Will likely neext 24-48 hours of diuresis   [place]

## 2016-04-24 NOTE — Care Management Note (Signed)
Case Management Note  Patient Details  Name: Cheryl Vargas MRN: [id number] Date of Birth: 15-Apr-1935  Subjective/Objective:      Pt admitted with SOB and orthopnea              Action/Plan:  PTA independent from home prior to event that led to admit.  Pt states she weighs her daily and tries to adhere to low salt diet.  CM ordered PT.   Expected Discharge Date:                  Expected Discharge Plan:  [place]  In-House Referral:     Discharge planning Services  CM Consult  Post Acute Care Choice:    Choice offered to:     DME Arranged:    DME Agency:     HH Arranged:    [place] Agency:     Status of Service:  In process, will continue to follow  If discussed at Long Length of Stay Meetings, dates discussed:    Additional Comments:  Maryclare Labrador, RN 04/24/2016, 11:53 AM

## 2016-04-24 NOTE — Progress Notes (Signed)
PT Cancellation Note  Patient Details Name: Cheryl Vargas MRN: [id number] DOB: 1934/12/30   Cancelled Treatment:    Reason Eval/Treat Not Completed: PT screened, no needs identified, will sign off. Pt reports she is mobilizing without any difficulty and no PT needed.   Derreck Wiltsey 04/24/2016, 1:00 PM [place]

## 2016-04-24 NOTE — Progress Notes (Signed)
Patient Name: Cheryl Vargas Date of Encounter: 04/24/2016  Principal Problem:   Acute on chronic combined systolic (congestive) and diastolic (congestive) heart failure (HCC) Active Problems:   Hyperlipidemia   Atrial fibrillation (HCC)   Hypertension   CHF (congestive heart failure), NYHA class IV [facility])   Primary Cardiologist: Dr. Curt Bears Patient Profile: 81 year old female with a past medical history of HTN, HLD, and PAF (on Xarelto). She was admitted with SOB, orthopnea. Started on Amiodarone 6 days ago.   SUBJECTIVE: Feels ok, still feels weak. Denies chest pain.   OBJECTIVE Vitals:   04/24/16 0030 04/24/16 0101 04/24/16 0125 04/24/16 0644  BP: 109/70  108/83 (!) 103/49  Pulse: 70  73 79  Resp: 21  20 18   Temp:  99.2 F (37.3 C) 97.8 F (36.6 C) 98.3 F (36.8 C)  TempSrc:  Oral Oral Oral  SpO2: 94%  98% 98%  Weight:   143 lb 3.2 oz (65 kg)   Height:   5\' 1"  (1.549 m)     Intake/Output Summary (Last 24 hours) at 04/24/16 1028 Last data filed at 04/24/16 1022  Gross per 24 hour  Intake                3 ml  Output             1375 ml  Net            -1372 ml   Filed Weights   04/24/16 0125  Weight: 143 lb 3.2 oz (65 kg)    PHYSICAL EXAM General: Well developed, well nourished, female in no acute distress. Head: Normocephalic, atraumatic.  Neck: Supple without bruits, no JVD. Lungs:  Resp regular and unlabored, CTA  Heart: RRR, S1, S2, no S3, S4, or murmur; no rub. Abdomen: Soft, non-tender, non-distended, BS + x 4.  Extremities: No clubbing, cyanosis, no edema.  Neuro: Alert and oriented X 3. Moves all extremities spontaneously. Psych: Normal affect.  LABS: CBC: Recent Labs  04/23/16 1835  WBC 7.7  HGB 15.1*  HCT 43.2  MCV 88.9  PLT Q000111Q   Basic Metabolic Panel: Recent Labs  04/23/16 1835 04/24/16 0325  NA 136 139  K 3.7 4.0  CL 106 101  CO2 20* 29  GLUCOSE 122* 108*  BUN 10 8  CREATININE 0.88 0.95  CALCIUM 9.2 9.3   Liver  Function Tests: Recent Labs  04/23/16 1835  AST 28  ALT 23  ALKPHOS 66  BILITOT 0.9  PROT 6.6  ALBUMIN 3.5    Recent Labs  04/23/16 2023  TROPIPOC 0.02   BNP:  B Natriuretic Peptide  Date/Time Value Ref Range Status  04/23/2016 06:35 PM 1,592.6 (H) 0.0 - 100.0 pg/mL Final  10/18/2015 06:57 PM 536.7 (H) 0.0 - 100.0 pg/mL Final     Current Facility-Administered Medications:  .  0.9 %  sodium chloride infusion, 250 mL, Intravenous, PRN, Rafael Bihari, MD .  acetaminophen (TYLENOL) tablet 650 mg, 650 mg, Oral, Q4H PRN, Rafael Bihari, MD .  amiodarone (PACERONE) tablet 200 mg, 200 mg, Oral, Daily, Sulaiman Durenda Age, MD, 200 mg at 04/24/16 1022 .  furosemide (LASIX) injection 40 mg, 40 mg, Intravenous, Daily, Sulaiman Durenda Age, MD, 40 mg at 04/24/16 1022 .  ondansetron (ZOFRAN) injection 4 mg, 4 mg, Intravenous, Q6H PRN, Rafael Bihari, MD .  Rivaroxaban (XARELTO) tablet 15 mg, 15 mg, Oral, Q supper, Sulaiman Durenda Age, MD .  sodium chloride flush (NS)  0.9 % injection 3 mL, 3 mL, Intravenous, Q12H, Sulaiman Durenda Age, MD, 3 mL at 04/24/16 1022 .  sodium chloride flush (NS) 0.9 % injection 3 mL, 3 mL, Intravenous, PRN, Rafael Bihari, MD .  zolpidem (AMBIEN) tablet 5 mg, 5 mg, Oral, QHS, Sulaiman Durenda Age, MD    TELE:  NSR with PAC's    ECG: NSR  Radiology/Studies: Dg Abd Acute W/chest  Result Date: 04/23/2016 CLINICAL DATA:  Pt c/o SOB, epigastric abdominal pain, nausea, and constipation x 1 week. Hx of AFIB AND an impaction. EXAM: DG ABDOMEN ACUTE W/ 1V CHEST COMPARISON:  Chest 10/18/2015 FINDINGS: Mild cardiac enlargement with prominent interstitial markings in the lungs, suggesting interstitial edema or infiltration. Small bilateral pleural effusions. Atelectasis in the lung bases. Calcification of the aorta. No pneumothorax. IMPRESSION: Cardiac enlargement with small bilateral pleural effusions. Interstitial edema or  infiltration. Consider congestive failure. Electronically Signed   By: Lucienne Capers M.D.   On: 04/23/2016 22:06     Current Medications:  . amiodarone  200 mg Oral Daily  . furosemide  40 mg Intravenous Daily  . Rivaroxaban  15 mg Oral Q supper  . sodium chloride flush  3 mL Intravenous Q12H  . zolpidem  5 mg Oral QHS      ASSESSMENT AND PLAN: Principal Problem:   Acute on chronic combined systolic (congestive) and diastolic (congestive) heart failure (HCC) Active Problems:   Hyperlipidemia   Atrial fibrillation (HCC)   Hypertension   CHF (congestive heart failure), NYHA class IV ([place])  1. Acute on chronic systolic and diastolic CHF: Presented with orthopnea and weakness. BNP is elevated at 1592.6 and chest X ray consistent with CHF. She tells me for about a week or 2 she has felt weak, very fatigued. Last Echo was in Feb. 2017, EF was 40-45% with diffuse hypokinesis. Akinesis of the inferolateral and inferior myocardium. Grade 2 diastolic dysfunction. Her last ischemic evaluation was in 2004. With her new onset fatigue and dyspnea, may benefit from cardiac cath to evaluate for ischemic cause.   Continue IV diuresis today.    2. PAF: Seen by Dr. Curt Bears last week. She was initially diagnosed with afib in Feb. 2017. She reported increased fatigue and palpitations. She was started on Amiodarone. She tells me that since starting the Amio, she has had abdominal pain, SOB and even worsening fatigue. MD to advise on continuing Amio.   She is in NSR on tele. Continue Xarelto.   This patients CHA2DS2-VASc Score and unadjusted Ischemic Stroke Rate (% per year) is equal to 7.2 % stroke rate/year from a score of 5 Above score calculated as 1 point each if present [CHF, HTN, DM, Vascular=MI/PAD/Aortic Plaque, Age if 65-74, or Female], 2 points each if present [Age > 75, or Stroke/TIA/TE]   3. HTN: Normotensive. Not on any anti-hypertensives.   Signed, Arbutus Leas , NP 10:28  AM 04/24/2016 Pager [phone]  Patient seen, examined. Available data reviewed. Agree with findings, assessment, and plan as outlined by Jettie Booze, NP. The patient is independently interviewed and examined. JVP is mildly elevated. Lungs are clear to auscultation bilaterally. Heart is regular rate and rhythm without murmur or gallop. Abdomen is soft and nontender. Extremities with no peripheral edema.  Office notes, echo data, laboratory data reviewed. The patient's clinical presentation is consistent with acute on chronic systolic heart failure. Her BNP was markedly elevated and she is clinically much improved with IV diuresis. I think IV furosemide should be continued today.  She has not been able to tolerate beta blockers or ACE inhibitor is in the past because of low blood pressure. I think her symptoms are most likely related to congestive heart failure rather than amiodarone. Advised that we will reduce her amiodarone dose to 200 mg once daily starting today. The patient has congestive heart failure with moderate segmental left ventricular dysfunction and akinesis of the inferior and lateral walls. The pattern of LV dysfunction is consistent with ischemic heart disease. Her last heart catheterization was greater than 10 years ago. I have recommended diagnostic cardiac catheterization to evaluate for coronary artery disease. The patient did not receive Xarelto yesterday and I will hold it today so that she can undergo left heart catheterization tomorrow. I have reviewed the risks, indications, and alternatives to cardiac catheterization, possible angioplasty, and stenting with the patient. Risks include but are not limited to bleeding, infection, vascular injury, stroke, myocardial infection, arrhythmia, kidney injury, radiation-related injury in the case of prolonged fluoroscopy use, emergency cardiac surgery, and death. The patient understands the risks of serious complication is 1-2 in 123XX123 with  diagnostic cardiac cath and 1-2% or less with angioplasty/stenting.   Sherren Mocha, M.D. 04/24/2016 1:10 PM

## 2016-04-24 NOTE — Care Management Obs Status (Signed)
[place] NOTIFICATION   Patient Details  Name: Cheryl Vargas MRN: [id number] Date of Birth: 16-May-1935   Medicare Observation Status Notification Given:  Yes    Maryclare Labrador, RN 04/24/2016, 11:53 AM

## 2016-04-25 ENCOUNTER — Observation Stay (HOSPITAL_BASED_OUTPATIENT_CLINIC_OR_DEPARTMENT_OTHER): Payer: Medicare Other

## 2016-04-25 ENCOUNTER — Encounter (HOSPITAL_COMMUNITY): Payer: Self-pay | Admitting: Internal Medicine

## 2016-04-25 ENCOUNTER — Encounter (HOSPITAL_COMMUNITY)
Admission: EM | Disposition: A | Discharge status: Home / Self Care | Payer: Self-pay | Source: Home / Self Care | Attending: Emergency Medicine

## 2016-04-25 DIAGNOSIS — I480 Paroxysmal atrial fibrillation: Secondary | ICD-10-CM | POA: Diagnosis not present

## 2016-04-25 DIAGNOSIS — I509 Heart failure, unspecified: Secondary | ICD-10-CM

## 2016-04-25 DIAGNOSIS — I5043 Acute on chronic combined systolic (congestive) and diastolic (congestive) heart failure: Secondary | ICD-10-CM | POA: Diagnosis not present

## 2016-04-25 HISTORY — PX: CARDIAC CATHETERIZATION: SHX172

## 2016-04-25 LAB — ECHOCARDIOGRAM COMPLETE
Height: 61 in
Weight: 2243.2 oz

## 2016-04-25 LAB — BASIC METABOLIC PANEL
Anion gap: 7 (ref 5–15)
BUN: 16 mg/dL (ref 6–20)
CO2: 30 mmol/L (ref 22–32)
Calcium: 8.9 mg/dL (ref 8.9–10.3)
Chloride: 99 mmol/L — ABNORMAL LOW (ref 101–111)
Creatinine, Ser: 1.12 mg/dL — ABNORMAL HIGH (ref 0.44–1.00)
GFR calc Af Amer: 53 mL/min — ABNORMAL LOW (ref >60)
GFR calc non Af Amer: 46 mL/min — ABNORMAL LOW (ref >60)
Glucose, Bld: 105 mg/dL — ABNORMAL HIGH (ref 65–99)
Potassium: 3.8 mmol/L (ref 3.5–5.1)
Sodium: 136 mmol/L (ref 135–145)

## 2016-04-25 SURGERY — LEFT HEART CATH AND CORONARY ANGIOGRAPHY
Anesthesia: LOCAL

## 2016-04-25 MED ORDER — IOPAMIDOL (ISOVUE-370) INJECTION 76%
INTRAVENOUS | Status: AC
  Filled 2016-04-25: qty 100

## 2016-04-25 MED ORDER — SODIUM CHLORIDE 0.9% FLUSH
3.0000 mL | Freq: Two times a day (BID) | INTRAVENOUS | Status: DC
  Administered 2016-04-25 – 2016-04-26 (×2): 3 mL via INTRAVENOUS

## 2016-04-25 MED ORDER — HEPARIN SODIUM (PORCINE) 1000 UNIT/ML IJ SOLN
INTRAMUSCULAR | Status: AC
  Filled 2016-04-25: qty 1

## 2016-04-25 MED ORDER — MIDAZOLAM HCL 2 MG/2ML IJ SOLN
INTRAMUSCULAR | Status: AC
  Filled 2016-04-25: qty 2

## 2016-04-25 MED ORDER — SODIUM CHLORIDE 0.9 % IV SOLN: INTRAVENOUS | Status: DC

## 2016-04-25 MED ORDER — HEPARIN SODIUM (PORCINE) 1000 UNIT/ML IJ SOLN
INTRAMUSCULAR | Status: DC | PRN
Start: 2016-04-25 — End: 2016-04-25
  Administered 2016-04-25: 3000 [IU] via INTRAVENOUS

## 2016-04-25 MED ORDER — ASPIRIN 81 MG PO CHEW
81.0000 mg | CHEWABLE_TABLET | ORAL | Status: AC
  Administered 2016-04-25: 81 mg via ORAL
  Filled 2016-04-25: qty 1

## 2016-04-25 MED ORDER — SODIUM CHLORIDE 0.9 % IV SOLN: 250.0000 mL | INTRAVENOUS | Status: DC | PRN

## 2016-04-25 MED ORDER — HEPARIN (PORCINE) IN NACL 2-0.9 UNIT/ML-% IJ SOLN
INTRAMUSCULAR | Status: AC
  Filled 2016-04-25: qty 1000

## 2016-04-25 MED ORDER — VERAPAMIL HCL 2.5 MG/ML IV SOLN
INTRAVENOUS | Status: AC
  Filled 2016-04-25: qty 2

## 2016-04-25 MED ORDER — SODIUM CHLORIDE 0.9% FLUSH: 3.0000 mL | INTRAVENOUS | Status: DC | PRN

## 2016-04-25 MED ORDER — FUROSEMIDE 20 MG PO TABS
20.0000 mg | ORAL_TABLET | Freq: Every day | ORAL | Status: DC
  Administered 2016-04-25 – 2016-04-26 (×2): 20 mg via ORAL
  Filled 2016-04-25 (×2): qty 1

## 2016-04-25 MED ORDER — LIDOCAINE HCL (PF) 1 % IJ SOLN
INTRAMUSCULAR | Status: AC
  Filled 2016-04-25: qty 30

## 2016-04-25 MED ORDER — MIDAZOLAM HCL 2 MG/2ML IJ SOLN
INTRAMUSCULAR | Status: DC | PRN
  Administered 2016-04-25: 0.5 mg via INTRAVENOUS

## 2016-04-25 MED ORDER — SODIUM CHLORIDE 0.9 % IV SOLN
INTRAVENOUS | Status: AC
  Administered 2016-04-25: 09:00:00 via INTRAVENOUS

## 2016-04-25 MED ORDER — ASPIRIN 81 MG PO CHEW: 81.0000 mg | CHEWABLE_TABLET | ORAL | Status: DC

## 2016-04-25 MED ORDER — VERAPAMIL HCL 2.5 MG/ML IV SOLN
INTRAVENOUS | Status: DC | PRN
  Administered 2016-04-25: 08:00:00 via INTRA_ARTERIAL

## 2016-04-25 MED ORDER — FENTANYL CITRATE (PF) 100 MCG/2ML IJ SOLN
INTRAMUSCULAR | Status: AC
  Filled 2016-04-25: qty 2

## 2016-04-25 MED ORDER — SODIUM CHLORIDE 0.9% FLUSH
3.0000 mL | Freq: Two times a day (BID) | INTRAVENOUS | Status: DC
  Administered 2016-04-25: 3 mL via INTRAVENOUS

## 2016-04-25 MED ORDER — FENTANYL CITRATE (PF) 100 MCG/2ML IJ SOLN
INTRAMUSCULAR | Status: DC | PRN
  Administered 2016-04-25: 25 ug via INTRAVENOUS

## 2016-04-25 MED ORDER — LISINOPRIL 2.5 MG PO TABS
2.5000 mg | ORAL_TABLET | Freq: Every day | ORAL | Status: DC
  Administered 2016-04-25 – 2016-04-26 (×2): 2.5 mg via ORAL
  Filled 2016-04-25 (×2): qty 1

## 2016-04-25 MED ORDER — HEPARIN (PORCINE) IN NACL 2-0.9 UNIT/ML-% IJ SOLN
INTRAMUSCULAR | Status: DC | PRN
  Administered 2016-04-25: 1000 mL

## 2016-04-25 SURGICAL SUPPLY — 9 items
CATH INFINITI 5 FR JL3.5 (CATHETERS) ×2 IMPLANT
CATH INFINITI JR4 5F (CATHETERS) ×2 IMPLANT
DEVICE RAD COMP TR BAND LRG (VASCULAR PRODUCTS) ×2 IMPLANT
GLIDESHEATH SLEND SS 6F .021 (SHEATH) ×4 IMPLANT
KIT HEART LEFT (KITS) ×2 IMPLANT
PACK CARDIAC CATHETERIZATION (CUSTOM PROCEDURE TRAY) ×2 IMPLANT
TRANSDUCER W/STOPCOCK (MISCELLANEOUS) ×2 IMPLANT
TUBING CIL FLEX 10 FLL-RA (TUBING) ×2 IMPLANT
WIRE SAFE-T 1.5MM-J .035X260CM (WIRE) ×2 IMPLANT

## 2016-04-25 NOTE — Interval H&P Note (Signed)
History and Physical Interval Note:  04/25/2016 7:22 AM  Cheryl Vargas  has presented today for cardiac catheterization, with the diagnosis of CHF.  The various methods of treatment have been discussed with the patient and family. After consideration of risks, benefits and other options for treatment, the patient has consented to  Procedure(s): Left Heart Cath and Coronary Angiography (N/A) as a surgical intervention .  The patient's history has been reviewed, patient examined, no change in status, stable for surgery.  I have reviewed the patient's chart and labs.  Questions were answered to the patient's satisfaction.    Cath Lab Visit (complete for each Cath Lab visit)  Clinical Evaluation Leading to the Procedure:   ACS: No.  Non-ACS:    Anginal Classification: CCS IV (presented with acute on chronic heart failure)  Anti-ischemic medical therapy: No Therapy  Non-Invasive Test Results: No non-invasive testing performed  Prior CABG: No previous CABG   Robert Sunga

## 2016-04-25 NOTE — H&P (View-Only) (Signed)
Patient Name: Cheryl Vargas Date of Encounter: 04/24/2016  Principal Problem:   Acute on chronic combined systolic (congestive) and diastolic (congestive) heart failure (HCC) Active Problems:   Hyperlipidemia   Atrial fibrillation (HCC)   Hypertension   CHF (congestive heart failure), NYHA class IV [facility])   Primary Cardiologist: Dr. Curt Bears Patient Profile: 81 year old female with a past medical history of HTN, HLD, and PAF (on Xarelto). She was admitted with SOB, orthopnea. Started on Amiodarone 6 days ago.   SUBJECTIVE: Feels ok, still feels weak. Denies chest pain.   OBJECTIVE Vitals:   04/24/16 0030 04/24/16 0101 04/24/16 0125 04/24/16 0644  BP: 109/70  108/83 (!) 103/49  Pulse: 70  73 79  Resp: 21  20 18   Temp:  99.2 F (37.3 C) 97.8 F (36.6 C) 98.3 F (36.8 C)  TempSrc:  Oral Oral Oral  SpO2: 94%  98% 98%  Weight:   143 lb 3.2 oz (65 kg)   Height:   5\' 1"  (1.549 m)     Intake/Output Summary (Last 24 hours) at 04/24/16 1028 Last data filed at 04/24/16 1022  Gross per 24 hour  Intake                3 ml  Output             1375 ml  Net            -1372 ml   Filed Weights   04/24/16 0125  Weight: 143 lb 3.2 oz (65 kg)    PHYSICAL EXAM General: Well developed, well nourished, female in no acute distress. Head: Normocephalic, atraumatic.  Neck: Supple without bruits, no JVD. Lungs:  Resp regular and unlabored, CTA  Heart: RRR, S1, S2, no S3, S4, or murmur; no rub. Abdomen: Soft, non-tender, non-distended, BS + x 4.  Extremities: No clubbing, cyanosis, no edema.  Neuro: Alert and oriented X 3. Moves all extremities spontaneously. Psych: Normal affect.  LABS: CBC: Recent Labs  04/23/16 1835  WBC 7.7  HGB 15.1*  HCT 43.2  MCV 88.9  PLT Q000111Q   Basic Metabolic Panel: Recent Labs  04/23/16 1835 04/24/16 0325  NA 136 139  K 3.7 4.0  CL 106 101  CO2 20* 29  GLUCOSE 122* 108*  BUN 10 8  CREATININE 0.88 0.95  CALCIUM 9.2 9.3   Liver  Function Tests: Recent Labs  04/23/16 1835  AST 28  ALT 23  ALKPHOS 66  BILITOT 0.9  PROT 6.6  ALBUMIN 3.5    Recent Labs  04/23/16 2023  TROPIPOC 0.02   BNP:  B Natriuretic Peptide  Date/Time Value Ref Range Status  04/23/2016 06:35 PM 1,592.6 (H) 0.0 - 100.0 pg/mL Final  10/18/2015 06:57 PM 536.7 (H) 0.0 - 100.0 pg/mL Final     Current Facility-Administered Medications:  .  0.9 %  sodium chloride infusion, 250 mL, Intravenous, PRN, Rafael Bihari, MD .  acetaminophen (TYLENOL) tablet 650 mg, 650 mg, Oral, Q4H PRN, Rafael Bihari, MD .  amiodarone (PACERONE) tablet 200 mg, 200 mg, Oral, Daily, Sulaiman Durenda Age, MD, 200 mg at 04/24/16 1022 .  furosemide (LASIX) injection 40 mg, 40 mg, Intravenous, Daily, Sulaiman Durenda Age, MD, 40 mg at 04/24/16 1022 .  ondansetron (ZOFRAN) injection 4 mg, 4 mg, Intravenous, Q6H PRN, Rafael Bihari, MD .  Rivaroxaban (XARELTO) tablet 15 mg, 15 mg, Oral, Q supper, Sulaiman Durenda Age, MD .  sodium chloride flush (NS)  0.9 % injection 3 mL, 3 mL, Intravenous, Q12H, Sulaiman Durenda Age, MD, 3 mL at 04/24/16 1022 .  sodium chloride flush (NS) 0.9 % injection 3 mL, 3 mL, Intravenous, PRN, Rafael Bihari, MD .  zolpidem (AMBIEN) tablet 5 mg, 5 mg, Oral, QHS, Sulaiman Durenda Age, MD    TELE:  NSR with PAC's    ECG: NSR  Radiology/Studies: Dg Abd Acute W/chest  Result Date: 04/23/2016 CLINICAL DATA:  Pt c/o SOB, epigastric abdominal pain, nausea, and constipation x 1 week. Hx of AFIB AND an impaction. EXAM: DG ABDOMEN ACUTE W/ 1V CHEST COMPARISON:  Chest 10/18/2015 FINDINGS: Mild cardiac enlargement with prominent interstitial markings in the lungs, suggesting interstitial edema or infiltration. Small bilateral pleural effusions. Atelectasis in the lung bases. Calcification of the aorta. No pneumothorax. IMPRESSION: Cardiac enlargement with small bilateral pleural effusions. Interstitial edema or  infiltration. Consider congestive failure. Electronically Signed   By: Lucienne Capers M.D.   On: 04/23/2016 22:06     Current Medications:  . amiodarone  200 mg Oral Daily  . furosemide  40 mg Intravenous Daily  . Rivaroxaban  15 mg Oral Q supper  . sodium chloride flush  3 mL Intravenous Q12H  . zolpidem  5 mg Oral QHS      ASSESSMENT AND PLAN: Principal Problem:   Acute on chronic combined systolic (congestive) and diastolic (congestive) heart failure (HCC) Active Problems:   Hyperlipidemia   Atrial fibrillation (HCC)   Hypertension   CHF (congestive heart failure), NYHA class IV ([place])  1. Acute on chronic systolic and diastolic CHF: Presented with orthopnea and weakness. BNP is elevated at 1592.6 and chest X ray consistent with CHF. She tells me for about a week or 2 she has felt weak, very fatigued. Last Echo was in Feb. 2017, EF was 40-45% with diffuse hypokinesis. Akinesis of the inferolateral and inferior myocardium. Grade 2 diastolic dysfunction. Her last ischemic evaluation was in 2004. With her new onset fatigue and dyspnea, may benefit from cardiac cath to evaluate for ischemic cause.   Continue IV diuresis today.    2. PAF: Seen by Dr. Curt Bears last week. She was initially diagnosed with afib in Feb. 2017. She reported increased fatigue and palpitations. She was started on Amiodarone. She tells me that since starting the Amio, she has had abdominal pain, SOB and even worsening fatigue. MD to advise on continuing Amio.   She is in NSR on tele. Continue Xarelto.   This patients CHA2DS2-VASc Score and unadjusted Ischemic Stroke Rate (% per year) is equal to 7.2 % stroke rate/year from a score of 5 Above score calculated as 1 point each if present [CHF, HTN, DM, Vascular=MI/PAD/Aortic Plaque, Age if 65-74, or Female], 2 points each if present [Age > 75, or Stroke/TIA/TE]   3. HTN: Normotensive. Not on any anti-hypertensives.   Signed, Arbutus Leas , NP 10:28  AM 04/24/2016 Pager [phone]  Patient seen, examined. Available data reviewed. Agree with findings, assessment, and plan as outlined by Jettie Booze, NP. The patient is independently interviewed and examined. JVP is mildly elevated. Lungs are clear to auscultation bilaterally. Heart is regular rate and rhythm without murmur or gallop. Abdomen is soft and nontender. Extremities with no peripheral edema.  Office notes, echo data, laboratory data reviewed. The patient's clinical presentation is consistent with acute on chronic systolic heart failure. Her BNP was markedly elevated and she is clinically much improved with IV diuresis. I think IV furosemide should be continued today.  She has not been able to tolerate beta blockers or ACE inhibitor is in the past because of low blood pressure. I think her symptoms are most likely related to congestive heart failure rather than amiodarone. Advised that we will reduce her amiodarone dose to 200 mg once daily starting today. The patient has congestive heart failure with moderate segmental left ventricular dysfunction and akinesis of the inferior and lateral walls. The pattern of LV dysfunction is consistent with ischemic heart disease. Her last heart catheterization was greater than 10 years ago. I have recommended diagnostic cardiac catheterization to evaluate for coronary artery disease. The patient did not receive Xarelto yesterday and I will hold it today so that she can undergo left heart catheterization tomorrow. I have reviewed the risks, indications, and alternatives to cardiac catheterization, possible angioplasty, and stenting with the patient. Risks include but are not limited to bleeding, infection, vascular injury, stroke, myocardial infection, arrhythmia, kidney injury, radiation-related injury in the case of prolonged fluoroscopy use, emergency cardiac surgery, and death. The patient understands the risks of serious complication is 1-2 in 123XX123 with  diagnostic cardiac cath and 1-2% or less with angioplasty/stenting.   Sherren Mocha, M.D. 04/24/2016 1:10 PM

## 2016-04-25 NOTE — Progress Notes (Signed)
   Subjective: Pt. Was feeling better, had her cath. Done today and feeling much relieved after normal cath. States that she wants to be set up at HF clinic with Dr. Aundra Dubin. She was having meds. S/E with Amiodarone and wants to dc that.Going for ECHO today to reassess her LV functions.  Objective:  Vital signs in last 24 hours: Vitals:   04/25/16 0901 04/25/16 0920 04/25/16 0949 04/25/16 1015  BP: (!) 96/58 100/68 (!) 100/48 (!) 98/50  Pulse: 64 68 66 64  Resp:      Temp:      TempSrc:      SpO2: 95% 96% 94%   Weight:      Height:       I/O: -937 Total: -2072 General: Alert and oriented, comfortably lying in bed. No acute distress. Chest: Clear bilaterally. No added sounds. CVS: RRR. No R/M/G Abdomen: Soft, non tender, non distended,BS +ve Extremities: No edema, no cyanosis, peripheral pulses +ve bilaterally.  Left Heart Cath and Coronary Angiography Conclusion   1.  No angiographically significant coronary artery disease. 2.  Normal left ventricular filling pressure.  Plan: 1.  Proceed with echocardiogram today to reassess LV function. 2.  Medical therapy of non-ischemic cardiomyopathy and acute on chronic heart failure.     Assessment/Plan: 81 y/o woman with pmh of HTN, HLP, PAF CHADSVASC = 5  On xarelto, chronic systolic and diastolic CHF, EF [id number] came to ed with complaints of difficulty breathing and abd full ness with some nausea that started yesterday.Started on Amiodarone 6 days ago. Had her cardiac Cath. Today, which was negative for any obstructive CAD.   Acute on Chronic HF: She was having bilateral crackles on admission, was given lasix and she diurese well and became hypotensive. Lasix was stopped. Her lungs are clear today. -Restart lasix low dose.20 mg Daily -Make a FU at HF clinic at patients request  -ECHO today to reassess her LV function.  PAF: She is in NSR, Unable to tolerate beta blocker in the past . Started on Amiodarone 6 days ago and she c/o  bothersome s/e of nausea and abdominal pain since then and wants to stop it.She was on Xeralto which was hold for cardiac cath. -Restart Alen Blew -Stop Amiodarone -Start Lisinopril 2.5 mg daily  HTN; Currently mildly hypotensive. Not on any antihypertensive meds.She was able to tolerate lisinopril 2.5 mg in the past. -Restart lisinopril 2.5 mg from tomorrow and monitor BP.  Dispo: Anticipated discharge in approximately 1 day(s).   Cheryl Nimrod, MD 04/25/2016, 11:02 AM Pager: [phone]  Patient seen, examined. Available data reviewed. Agree with findings, assessment, and plan as outlined by Dr Reesa Chew. Exam shows alert oriented woman in NAD. Lungs CTA, heart RRR without murmur, right radial site clear, no edema. Cath findings reviewed - normal coronaries. Pt wishes to DC amiodarone - will stop this. She can't take a beta-blocker - has tried multiple agents in past. Will start lisinopril 2.5 mg daily and would DC on lasix 20 mg daily. Pt requests FU with Dr Aundra Dubin in [place] clinic - will arrange. OK to DC tomorrow.  Sherren Mocha, M.D. 04/25/2016 2:19 PM

## 2016-04-25 NOTE — Progress Notes (Signed)
  Echocardiogram 2D Echocardiogram has been performed.  Cheryl Vargas M 04/25/2016, 2:47 PM

## 2016-04-26 ENCOUNTER — Encounter (HOSPITAL_COMMUNITY): Payer: Self-pay | Admitting: Cardiology

## 2016-04-26 DIAGNOSIS — I5043 Acute on chronic combined systolic (congestive) and diastolic (congestive) heart failure: Secondary | ICD-10-CM | POA: Diagnosis not present

## 2016-04-26 LAB — BASIC METABOLIC PANEL
Anion gap: 9 (ref 5–15)
BUN: 17 mg/dL (ref 6–20)
CO2: 29 mmol/L (ref 22–32)
Calcium: 8.9 mg/dL (ref 8.9–10.3)
Chloride: 100 mmol/L — ABNORMAL LOW (ref 101–111)
Creatinine, Ser: 0.85 mg/dL (ref 0.44–1.00)
GFR calc Af Amer: >60 mL/min (ref >60)
GFR calc non Af Amer: >60 mL/min (ref >60)
Glucose, Bld: 100 mg/dL — ABNORMAL HIGH (ref 65–99)
Potassium: 4.2 mmol/L (ref 3.5–5.1)
Sodium: 138 mmol/L (ref 135–145)

## 2016-04-26 MED ORDER — FUROSEMIDE 20 MG PO TABS: 20.0000 mg | ORAL_TABLET | Freq: Every day | ORAL | 12 refills | Status: DC

## 2016-04-26 MED ORDER — RIVAROXABAN 15 MG PO TABS
15.0000 mg | ORAL_TABLET | Freq: Every day | ORAL | Status: DC
  Filled 2016-04-26: qty 1

## 2016-04-26 MED ORDER — LISINOPRIL 2.5 MG PO TABS: 2.5000 mg | ORAL_TABLET | Freq: Every day | ORAL | 12 refills | Status: DC

## 2016-04-26 MED FILL — Lidocaine HCl Local Preservative Free (PF) Inj 1%: INTRAMUSCULAR | Qty: 30 | Status: AC

## 2016-04-26 NOTE — Discharge Summary (Signed)
Discharge Summary    Patient ID: Cheryl Vargas,  MRN: [id number], DOB/AGE: [birth year] 81 y.o.  Admit date: 04/23/2016 Discharge date: 04/26/2016  Primary Care Provider: Irven Shelling Primary Cardiologist: Dr. Aundra Dubin  Discharge Diagnoses    Principal Problem:   Acute on chronic combined systolic (congestive) and diastolic (congestive) heart failure ([place]) Active Problems:   Hyperlipidemia   Atrial fibrillation (HCC)   Hypertension   CHF (congestive heart failure), NYHA class IV (HCC)   Allergies Allergies  Allergen Reactions  . Alendronate Sodium Other (See Comments)    Arm pain    Diagnostic Studies/Procedures  Transthoracic Echocardiography 04/25/16 Study Conclusions  - Left ventricle: The cavity size was normal. Systolic function was   mildly to moderately reduced. The estimated ejection fraction was   in the range of 40% to 45%. There is akinesis of the   mid-apicalinferoseptal myocardium. Doppler parameters are   consistent with a reversible restrictive pattern, indicative of   decreased left ventricular diastolic compliance and/or increased   left atrial pressure (grade 3 diastolic dysfunction). - Aortic valve: There was moderate regurgitation directed towards   the mitral anterior leaflet. - Mitral valve: There was moderate regurgitation. - Left atrium: The atrium was moderately to severely dilated.   Volume/bsa, ES (1-plane Simpson&'s, A4C): 54.1 ml/m^2. - Right atrium: The atrium was mildly dilated. - Pulmonary arteries: Systolic pressure was moderately increased.   PA peak pressure: 50 mm Hg (S). - Pericardium, extracardiac: There was a moderate-sized left   pleural effusion.  Impressions:  - Compared to the prior study, there has been no significant   interval change.   Left Heart Cath and Coronary Angiography 04/25/16 1. No angiographically significant coronary artery disease. 2.  Normal left ventricular filling  pressure.  Plan: 1.  Proceed with echocardiogram today to reassess LV function. 2.  Medical therapy of non-ischemic cardiomyopathy and acute on chronic heart failure.    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 She presented to the ED on 04/23/16 with increasing SOB and orthopnea.   Hospital Course  She was admitted for further observation and work up. She was seen by electrophysiology on 04/17/16, and was started on po Amiodarone as she reported increased palpitations and fatigue. EKG from that visit shows NSR with PAC's.   Her BNP was elevated at 1592.6 and chest x ray was consistent with CHF. With her complaints of new onset fatigue and dyspnea combined with her known LV dysfunction, it was felt that she would benefit from an ischemic evaluation by cath. She underwent left heart cath, report above. She had no obstructive CAD and normal left ventricular filling pressure.   She reported feeling nauseous and having abdominal pain after starting the amiodarone a few days prior, it was discontinued during admission as she was in NSR and she did not want to take it any longer.   We will continue her low dose ACE-I and 20mg  daily of furosemide. She has been intolerant of multiple beta blockers in the past. Her renal function is stable, creatinine was 0.85 at discharge.   Her Xarelto will be continued at discharge for her PAF. She will follow up with Dr. Aundra Dubin in the heart failure clinic per the patient request.   She was seen today by Dr. Burt Knack and deemed suitable for discharge.  _____________  Discharge Vitals Blood pressure (!) 102/57, pulse 65, temperature 97.4 F (36.3 C), temperature source Oral, resp. rate 20, height 5\' 1"  (1.549 m), weight 143 lb 4.8 oz (65 kg), SpO2 95 %.  Filed Weights   04/24/16 1638 04/25/16  0401 04/26/16 0450  Weight: 143 lb 9 oz (65.1 kg) 140 lb 3.2 oz (63.6 kg) 143 lb 4.8 oz (65 kg)    Labs & Radiologic Studies     CBC  Recent Labs  04/23/16 1835  WBC 7.7  HGB 15.1*  HCT 43.2  MCV 88.9  PLT [id number]   Basic Metabolic Panel  Recent Labs  04/25/16 0328 04/26/16 0345  NA 136 138  K 3.8 4.2  CL 99* 100*  CO2 30 29  GLUCOSE 105* 100*  BUN 16 17  CREATININE 1.12* 0.85  CALCIUM 8.9 8.9   Liver Function Tests  Recent Labs  04/23/16 1835  AST 28  ALT 23  ALKPHOS 66  BILITOT 0.9  PROT 6.6  ALBUMIN 3.5    Recent Labs  04/23/16 1835  LIPASE 20    Dg Abd Acute W/chest  Result Date: 04/23/2016 CLINICAL DATA:  Pt c/o SOB, epigastric abdominal pain, nausea, and constipation x 1 week. Hx of AFIB AND an impaction. EXAM: DG ABDOMEN ACUTE W/ 1V CHEST COMPARISON:  Chest 10/18/2015 FINDINGS: Mild cardiac enlargement with prominent interstitial markings in the lungs, suggesting interstitial edema or infiltration. Small bilateral pleural effusions. Atelectasis in the lung bases. Calcification of the aorta. No pneumothorax. IMPRESSION: Cardiac enlargement with small bilateral pleural effusions. Interstitial edema or infiltration. Consider congestive failure. Electronically Signed   By: Lucienne Capers M.D.   On: 04/23/2016 22:06    Disposition   Pt is being discharged home today in good condition.  Follow-up Plans & Appointments    Follow-up Information    [name], MD Follow up on 05/26/2016.   Specialty:  Cardiology Why:  at 11:40am - at Heart Failure clinic with Dr. Lavell Islam information: [address] [place] [address] [phone]          Discharge Instructions    Diet - low sodium heart healthy    Complete by:  As directed   Increase activity slowly    Complete by:  As directed      Discharge Medications   Current Discharge Medication List    START taking these medications   Details  furosemide (LASIX) 20  MG tablet Take 1 tablet (20 mg total) by mouth daily. Qty: 30 tablet, Refills: 12    lisinopril (PRINIVIL,ZESTRIL) 2.5 MG tablet Take 1 tablet (2.5 mg total) by mouth daily. Qty: 30 tablet, Refills: 12      CONTINUE these medications which have NOT CHANGED   Details  acetaminophen (TYLENOL) 500 MG tablet Take 500 mg by mouth every 6 (six) hours as needed for moderate pain.    naphazoline-pheniramine (NAPHCON-A) 0.025-0.3 % ophthalmic solution Place 1 drop into both eyes daily as needed. For dry eyes    Rivaroxaban (XARELTO) 15 MG TABS tablet Take 1 tablet (15 mg total) by mouth daily with supper. Qty: 30 tablet, Refills: 3    zolpidem (AMBIEN) 5 MG tablet Take 5 mg by mouth at bedtime.       STOP taking these medications     amiodarone (PACERONE) 200 MG tablet             Outstanding Labs/Studies   BMP  Duration of Discharge Encounter   Greater  than 30 minutes including physician time.  Signed, Arbutus Leas NP 04/26/2016, 2:59 PM

## 2016-04-26 NOTE — Progress Notes (Signed)
Patient Name: Cheryl Vargas Date of Encounter: 04/26/2016  Principal Problem:   Acute on chronic combined systolic (congestive) and diastolic (congestive) heart failure (HCC) Active Problems:   Hyperlipidemia   Atrial fibrillation (HCC)   Hypertension   CHF (congestive heart failure), NYHA class IV [facility])   Patient Profile: 81 year old female with a past medical history of HTN, HLD, and PAF (on Xarelto). She was admitted with SOB, orthopnea. Cath showed no significant CAD.   SUBJECTIVE: Feels well, denies chest pain and SOB.   OBJECTIVE Vitals:   04/25/16 1217 04/25/16 2011 04/26/16 0450 04/26/16 1012  BP: (!) 101/56 (!) 104/53 104/60 108/60  Pulse: 68 66 95 72  Resp:  20 20   Temp:  98 F (36.7 C) 97.4 F (36.3 C)   TempSrc:  Oral Oral   SpO2: 94% 93% 94%   Weight:   143 lb 4.8 oz (65 kg)   Height:        Intake/Output Summary (Last 24 hours) at 04/26/16 1128 Last data filed at 04/26/16 0815  Gross per 24 hour  Intake              730 ml  Output              300 ml  Net              430 ml   Filed Weights   04/24/16 1638 04/25/16 0401 04/26/16 0450  Weight: 143 lb 9 oz (65.1 kg) 140 lb 3.2 oz (63.6 kg) 143 lb 4.8 oz (65 kg)    PHYSICAL EXAM General: Well developed, well nourished, female in no acute distress. Head: Normocephalic, atraumatic.  Neck: Supple without bruits,no JVD. Lungs:  Resp regular and unlabored, CTA. Heart: RRR, S1, S2, no S3, S4, or murmur; no rub. Abdomen: Soft, non-tender, non-distended, BS + x 4.  Extremities: No clubbing, cyanosis, no edema.  Neuro: Alert and oriented X 3. Moves all extremities spontaneously. Psych: Normal affect.  LABS: CBC: Recent Labs  04/23/16 1835  WBC 7.7  HGB 15.1*  HCT 43.2  MCV 88.9  PLT 229   INR: Recent Labs  04/24/16 1705  INR [id number]   Basic Metabolic Panel: Recent Labs  04/25/16 0328 04/26/16 0345  NA 136 138  K 3.8 4.2  CL 99* 100*  CO2 30 29  GLUCOSE 105* 100*  BUN 16 17    CREATININE 1.12* 0.85  CALCIUM 8.9 8.9   Liver Function Tests: Recent Labs  04/23/16 1835  AST 28  ALT 23  ALKPHOS 66  BILITOT 0.9  PROT 6.6  ALBUMIN 3.5    Recent Labs  04/23/16 2023  TROPIPOC 0.02   BNP:  B Natriuretic Peptide  Date/Time Value Ref Range Status  04/23/2016 06:35 PM 1,592.6 (H) 0.0 - 100.0 pg/mL Final  10/18/2015 06:57 PM 536.7 (H) 0.0 - 100.0 pg/mL Final     Current Facility-Administered Medications:  .  0.9 %  sodium chloride infusion, 250 mL, Intravenous, PRN, Rafael Bihari, MD .  0.9 %  sodium chloride infusion, 250 mL, Intravenous, PRN, Nelva Bush, MD .  acetaminophen (TYLENOL) tablet 650 mg, 650 mg, Oral, Q4H PRN, Rafael Bihari, MD .  furosemide (LASIX) tablet 20 mg, 20 mg, Oral, Daily, Lorella Nimrod, MD, 20 mg at 04/26/16 1012 .  lisinopril (PRINIVIL,ZESTRIL) tablet 2.5 mg, 2.5 mg, Oral, Daily, Lorella Nimrod, MD, 2.5 mg at 04/26/16 1012 .  ondansetron (ZOFRAN) injection 4 mg, 4 mg,  Intravenous, Q6H PRN, Rafael Bihari, MD .  sodium chloride flush (NS) 0.9 % injection 3 mL, 3 mL, Intravenous, Q12H, Sulaiman Durenda Age, MD, 3 mL at 04/25/16 0952 .  sodium chloride flush (NS) 0.9 % injection 3 mL, 3 mL, Intravenous, PRN, Rafael Bihari, MD .  sodium chloride flush (NS) 0.9 % injection 3 mL, 3 mL, Intravenous, Q12H, Christopher End, MD, 3 mL at 04/26/16 1014 .  sodium chloride flush (NS) 0.9 % injection 3 mL, 3 mL, Intravenous, PRN, Nelva Bush, MD .  zolpidem (AMBIEN) tablet 5 mg, 5 mg, Oral, QHS, Sulaiman Durenda Age, MD, 5 mg at 04/25/16 2130      TELE: NSR        Echo 04/25/16 Study Conclusions  - Left ventricle: The cavity size was normal. Systolic function was   mildly to moderately reduced. The estimated ejection fraction was   in the range of 40% to 45%. There is akinesis of the   mid-apicalinferoseptal myocardium. Doppler parameters are   consistent with a reversible restrictive pattern,  indicative of   decreased left ventricular diastolic compliance and/or increased   left atrial pressure (grade 3 diastolic dysfunction). - Aortic valve: There was moderate regurgitation directed towards   the mitral anterior leaflet. - Mitral valve: There was moderate regurgitation. - Left atrium: The atrium was moderately to severely dilated.   Volume/bsa, ES (1-plane Simpson&'s, A4C): 54.1 ml/m^2. - Right atrium: The atrium was mildly dilated. - Pulmonary arteries: Systolic pressure was moderately increased.   PA peak pressure: 50 mm Hg (S). - Pericardium, extracardiac: There was a moderate-sized left   pleural effusion.  Impressions:  - Compared to the prior study, there has been no significant   interval change.   Current Medications:  . furosemide  20 mg Oral Daily  . lisinopril  2.5 mg Oral Daily  . sodium chloride flush  3 mL Intravenous Q12H  . sodium chloride flush  3 mL Intravenous Q12H  . zolpidem  5 mg Oral QHS      ASSESSMENT AND PLAN: Principal Problem:   Acute on chronic combined systolic (congestive) and diastolic (congestive) heart failure (HCC) Active Problems:   Hyperlipidemia   Atrial fibrillation (HCC)   Hypertension   CHF (congestive heart failure), NYHA class IV ([place])  1. Acute on Chronic combined systolic and diastolic HF: She was having bilateral crackles on admission, was given lasix and she diuresed well but became hypotensive. Lasix was stopped. Her lungs are clear today. -Restart lasix low dose 20 mg Daily -Follow up appt. Made at CHF clinic -EF is 40-45%, also with grade 3 diastolic dysfunction  2. PAF: She is in NSR, Unable to tolerate beta blocker in the past . Started on Amiodarone 6 days ago and she c/o bothersome nausea and abdominal pain since then and wants to stop it.She was on Xarelto which was hold for cardiac cath. -Restart Xarelto -Stop Amiodarone -Start Lisinopril 2.5 mg daily  This patients CHA2DS2-VASc Score and  unadjusted Ischemic Stroke Rate (% per year) is equal to 7.2 % stroke rate/year from a score of 5 Above score calculated as 1 point each if present [CHF, HTN, DM, Vascular=MI/PAD/Aortic Plaque, Age if 65-74, or Female], 2 points each if present [Age > 75, or Stroke/TIA/TE]   3. HTN: Currently mildly hypotensive. Not on any antihypertensive meds.She was able to tolerate lisinopril 2.5 mg in the past. -Restart lisinopril 2.5 mg from tomorrow and monitor BP.   Signed, Arbutus Leas , NP  11:28 AM 04/26/2016 Pager [phone]  Patient seen, examined. Available data reviewed. Agree with findings, assessment, and plan as outlined by Jettie Booze, NP. Exam reveals an alert and oriented woman in no distress. JVP is normal, lungs are clear bilaterally, heart has regular rate and rhythm without murmur or gallop, extremities have no edema. The patient is tolerating her medical regimen which includes low-dose lisinopril and furosemide. She has been unable to tolerate multiple beta blockers. Blood pressure is low but acceptable in the range of 96-108/50-60. Will continue her current therapy. She is medically stable for discharge. She will follow-up with Dr. Marigene Ehlers and the heart failure clinic as an outpatient. Xarelto was restarted for anticoagulation of paroxysmal atrial fibrillation. Amiodarone has been discontinued because of intolerance to this.  Sherren Mocha, M.D. 04/26/2016 2:23 PM

## 2016-05-17 ENCOUNTER — Other Ambulatory Visit: Payer: Self-pay | Admitting: Cardiology

## 2016-05-17 MED ORDER — FUROSEMIDE 20 MG PO TABS
20.0000 mg | ORAL_TABLET | Freq: Every day | ORAL | 3 refills | Status: DC
Start: 2016-05-17 — End: 2016-11-23

## 2016-05-17 MED ORDER — LISINOPRIL 2.5 MG PO TABS: 2.5000 mg | ORAL_TABLET | Freq: Every day | ORAL | 3 refills | Status: DC

## 2016-05-26 ENCOUNTER — Telehealth (HOSPITAL_COMMUNITY): Payer: Self-pay | Admitting: Vascular Surgery

## 2016-05-26 ENCOUNTER — Encounter (HOSPITAL_COMMUNITY): Payer: Self-pay

## 2016-05-26 ENCOUNTER — Ambulatory Visit (HOSPITAL_COMMUNITY)
Admission: RE | Admit: 2016-05-26 | Discharge: 2016-05-26 | Disposition: A | Discharge status: Home / Self Care | Payer: Medicare Other | Source: Ambulatory Visit | Attending: Cardiology | Admitting: Cardiology

## 2016-05-26 VITALS — BP 108/68 | HR 78 | Ht 65.0 in | Wt 140.8 lb

## 2016-05-26 DIAGNOSIS — I509 Heart failure, unspecified: Secondary | ICD-10-CM

## 2016-05-26 DIAGNOSIS — E785 Hyperlipidemia, unspecified: Secondary | ICD-10-CM | POA: Diagnosis not present

## 2016-05-26 DIAGNOSIS — I429 Cardiomyopathy, unspecified: Secondary | ICD-10-CM | POA: Insufficient documentation

## 2016-05-26 DIAGNOSIS — R0602 Shortness of breath: Secondary | ICD-10-CM | POA: Diagnosis not present

## 2016-05-26 DIAGNOSIS — I493 Ventricular premature depolarization: Secondary | ICD-10-CM | POA: Insufficient documentation

## 2016-05-26 DIAGNOSIS — I5022 Chronic systolic (congestive) heart failure: Secondary | ICD-10-CM | POA: Insufficient documentation

## 2016-05-26 DIAGNOSIS — I110 Hypertensive heart disease with heart failure: Secondary | ICD-10-CM | POA: Diagnosis not present

## 2016-05-26 DIAGNOSIS — Z7901 Long term (current) use of anticoagulants: Secondary | ICD-10-CM | POA: Diagnosis not present

## 2016-05-26 DIAGNOSIS — I480 Paroxysmal atrial fibrillation: Secondary | ICD-10-CM | POA: Insufficient documentation

## 2016-05-26 LAB — BASIC METABOLIC PANEL
Anion gap: 10 (ref 5–15)
BUN: 24 mg/dL — ABNORMAL HIGH (ref 6–20)
CO2: 26 mmol/L (ref 22–32)
Calcium: 9.5 mg/dL (ref 8.9–10.3)
Chloride: 100 mmol/L — ABNORMAL LOW (ref 101–111)
Creatinine, Ser: 0.98 mg/dL (ref 0.44–1.00)
GFR calc Af Amer: >60 mL/min (ref >60)
GFR calc non Af Amer: 54 mL/min — ABNORMAL LOW (ref >60)
Glucose, Bld: 104 mg/dL — ABNORMAL HIGH (ref 65–99)
Potassium: 4.1 mmol/L (ref 3.5–5.1)
Sodium: 136 mmol/L (ref 135–145)

## 2016-05-26 LAB — BRAIN NATRIURETIC PEPTIDE: B Natriuretic Peptide: 258.1 pg/mL — ABNORMAL HIGH (ref 0.0–100.0)

## 2016-05-26 MED ORDER — SPIRONOLACTONE 25 MG PO TABS: 12.5000 mg | ORAL_TABLET | Freq: Every day | ORAL | 4 refills | Status: DC

## 2016-05-26 MED ORDER — RIVAROXABAN 20 MG PO TABS: 20.0000 mg | ORAL_TABLET | Freq: Every day | ORAL | 6 refills | Status: DC

## 2016-05-26 NOTE — Progress Notes (Signed)
[facility]  Primary Care: Dr Laurann [place] HF Cardiology: Dr. 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 Cardiomyopathy dates back to at least 2014 based on echoes (EF 30-35% in 2014).   Admitted 8/17 for fatigue and dyspnea.  She was seen by electrophysiology on 04/17/16, and was started on po Amiodarone as she reported increased palpitations and fatigue with frequent PVCs. Her BNP was elevated at 1592.6 and chest x ray was consistent with CHF.  She was diuresed.  With her complaints of new onset fatigue and dyspnea combined with her known LV dysfunction, it was felt that she would benefit from an ischemic evaluation by cath. She underwent left heart cath with normal cors. Amiodarone was later stopped due to nausea.  Discharge weight 143 pounds. Echo (8/17) with EF 40-45%, inferoseptal akinesis.   Today she presents as new patient. She is intolerant of beta blockers due to profound fatigue. She is self-referred. Complains of fatigue. SOB with steps. Walks on the treadmill 15 minutes a day. Weight at home 139-140 pounds. 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  She continues to feel daily palpitations but no long runs reminescent of atrial fibrillation. No orthopnea/PND, no bendopnea, no chest pain.  BP generally runs low in 30Z-601U systolic.   Labs (8/17): K 4.2, creatinine 0.85, BNP 1593, HCT 43.2, TSH mild elevated 5.3, free T4 normal  ECG: NSR, IVCD 157 msec  1. Chronic systolic CHF:  Nonischemic cardiomyopathy.   - Echo (2014): EF 30-35%. - Echo (2/17): EF 40-45% - Echo (8/17): EF 40-45%, mid to apical inferoseptal akinesis - Coronary angiography (8/17): No significant CAD.  - Unable to tolerate beta blockers.  2. Atrial fibrillation: Paroxysmal.  Diagnosed 2/17.  Unable to tolerate amiodarone.  3. Hyperlipidemia 4. PVCs: frequent.   ROS: All systems  reviewed and negative except as per HPI.   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 Occasional ETOH, never heavy.    Current Outpatient Prescriptions  Medication Sig Dispense Refill  . acetaminophen (TYLENOL) 500 MG tablet Take 500 mg by mouth every 6 (six) hours as needed for moderate pain.    . furosemide (LASIX) 20 MG tablet Take 1 tablet (20 mg total) by mouth daily. 90 tablet 3  . lisinopril (PRINIVIL,ZESTRIL) 2.5 MG tablet Take 1 tablet (2.5 mg total) by mouth daily. 90 tablet 3  . naphazoline-pheniramine (NAPHCON-A) 0.025-0.3 % ophthalmic solution Place 1 drop into both eyes daily as needed. For dry eyes    . Rivaroxaban (XARELTO) 15 MG TABS tablet Take 1 tablet (15 mg total) by mouth daily with supper. 30 tablet 3  . zolpidem (AMBIEN) 5 MG tablet Take 5 mg by mouth at bedtime.      No current facility-administered medications for this encounter.     Vitals:   05/26/16 1137  BP: 108/68  Pulse: 78  SpO2: 93%  Weight: 140 lb 12.8 oz (63.9 kg)  Height: 5\' 5"  (1.651 m)    PHYSICAL EXAM: General:  Well appearing. No respiratory difficulty HEENT: normal Neck: supple. no JVD. Carotids 2+ bilat; no bruits. No lymphadenopathy or thryomegaly appreciated. Cor: PMI nondisplaced. Regular rate & rhythm. No rubs, gallops or murmurs. Lungs: clear Abdomen: soft, nontender, nondistended. No hepatosplenomegaly. No bruits or masses. Good bowel sounds. Extremities: no cyanosis, clubbing, rash, edema Neuro: alert &  oriented x 3, cranial nerves grossly intact. moves all 4 extremities w/o difficulty. Affect pleasant.  ASSESSMENT & PLAN:  1. Chronic systolic CHF: Nonischemic cardiomyopathy.  EF 30-35% in 2014, most recent echo in 8/17 with EF 40-45%.  Recently admitted in 8/17 with CHF exacerbation.  Angiography showed no significant coronary disease.  Etiology of CMP is uncertain.  Possible viral myocarditis.  She has frequent  PVCs, so cannot rule out PVC-mediated cardiomyopathy.  NYHA class II.  She is not volume overloaded. BP remains soft.   - Continue lisinopril 2.5 daily. - Add spironolactone 12.5 daily. BMET in 2 wks.  - Continue Lasix 20 mg daily.  - Unable to tolerate beta blockers to date. - I will arrange for a cardiac MRI to assess for infiltrative disease or prior myocarditis.  2. PVCs: H/o frequent PVCs, never had holter. Unable to tolerate amiodarone.  - I will arrange for holter monitor to quantify PVCs.  3. Atrial fibrillation: Paroxysmal. She is in NSR today. As above, unable to tolerate amiodarone.   - Xarelto dosing should be 20 mg daily. When she runs out of current bottle, will make her refill 20 mg daily.   Followup in 3-4 weeks.   [name] 05/28/2016

## 2016-05-26 NOTE — Patient Instructions (Signed)
COMPLETE all 15 mg of Xarelto, then increase to 20 mg daily in the evening START Spironolactone 12.5mg , one half tab daily  Labs today  Your physician has recommended that you wear a holter monitor. Holter monitors are medical devices that record the heart's electrical activity. Doctors most often use these monitors to diagnose arrhythmias. Arrhythmias are problems with the speed or rhythm of the heartbeat. The monitor is a small, portable device. You can wear one while you do your normal daily activities. This is usually used to diagnose what is causing palpitations/syncope (passing out).   Your physician has requested that you have a cardiac MRI. Cardiac MRI uses a computer to create images of your heart as its beating, producing both still and moving pictures of your heart and major blood vessels. For further information please visit [email or web address] Please follow the instruction sheet given to you today for more information.  Your physician recommends that you schedule a follow-up appointment in: 3-4 weeks with Dr Aundra Dubin

## 2016-05-26 NOTE — Telephone Encounter (Signed)
Left pt message to make f/u appt w/ [facility] 3 to 4 weeks

## 2016-05-30 ENCOUNTER — Telehealth (HOSPITAL_COMMUNITY): Payer: Self-pay | Admitting: Pharmacist

## 2016-05-30 ENCOUNTER — Telehealth (HOSPITAL_COMMUNITY): Payer: Self-pay

## 2016-05-30 LAB — PROTEIN ELECTROPHORESIS, SERUM
A/G Ratio: 1 (ref 0.7–1.7)
Albumin ELP: 3.7 g/dL (ref 2.9–4.4)
Alpha-1-Globulin: 0.3 g/dL (ref 0.0–0.4)
Alpha-2-Globulin: 0.9 g/dL (ref 0.4–1.0)
Beta Globulin: 1.5 g/dL — ABNORMAL HIGH (ref 0.7–1.3)
Gamma Globulin: 1.1 g/dL (ref 0.4–1.8)
Globulin, Total: 3.7 g/dL (ref 2.2–3.9)
Total Protein ELP: 7.4 g/dL (ref 6.0–8.5)

## 2016-05-30 MED ORDER — RIVAROXABAN 15 MG PO TABS: 15.0000 mg | ORAL_TABLET | Freq: Every day | ORAL | 11 refills | Status: DC

## 2016-05-30 NOTE — Telephone Encounter (Signed)
Patient calling CHF clinic triage line to calrify a few questions. 1. Arlyce Harman jsut started today (mail delivered).  Advised to wait approx 2 weeks after that to have labs drawn.  Labs scheduled at our office for the 29th for bmet per [facility]'s instructions. 2. Advised to take lasix and spiro in am, and lisinopril in pm as ok to do per patient request. 3. cMRI confirmed ordered but still pending to be called to patient to schedule. 4. Confirmed upcoming apt for holter monitor as well as our follow up apt. 5.  Advised ok to get flu shot.  Patient has no other questions/concerns at this time and reports doing well.  [name] Pain, RN

## 2016-05-30 NOTE — Addendum Note (Signed)
Addended by: Adora Fridge on: 05/30/2016 04:20 PM   Modules accepted: Orders

## 2016-05-30 NOTE — Telephone Encounter (Signed)
Based on Ms. Dikes's recent BMET, her Xarelto dose should be 15 mg daily with a CrCl of 46 ml/min based on her total body weight. Since Ms. Heuerman has a deductible to meet, she cannot afford the copay cost of her Xarelto so I will provide her with samples in the meantime.   Medication Samples have been provided to the patient.  Drug name: Xarelto       Strength: 15 mg        Qty: 42 LOT: [id number]  Exp.Date: 01/18  Dosing instructions: Take 1 tablet daily   The patient has been instructed regarding the correct time, dose, and frequency of taking this medication, including desired effects and most common side effects.   Ruta Hinds Mykale Gandolfo 4:09 PM 05/30/2016

## 2016-06-02 ENCOUNTER — Other Ambulatory Visit (HOSPITAL_COMMUNITY): Payer: Medicare Other

## 2016-06-02 ENCOUNTER — Other Ambulatory Visit (HOSPITAL_COMMUNITY): Payer: Self-pay

## 2016-06-02 ENCOUNTER — Telehealth (HOSPITAL_COMMUNITY): Payer: Self-pay

## 2016-06-02 DIAGNOSIS — I509 Heart failure, unspecified: Secondary | ICD-10-CM

## 2016-06-02 MED ORDER — SPIRONOLACTONE 25 MG PO TABS: 12.5000 mg | ORAL_TABLET | Freq: Every day | ORAL | 3 refills | Status: DC

## 2016-06-02 NOTE — Telephone Encounter (Signed)
Patient calling CHF clinic to ask for spiro Rx to be sent to [facility] as there is a shipping delay with [facility] due to recent inclement weather in that area. Rx sent to preferred pharmacy electronically for 30 day supply with refills as requested.  [name] Pain, RN

## 2016-06-06 ENCOUNTER — Ambulatory Visit (INDEPENDENT_AMBULATORY_CARE_PROVIDER_SITE_OTHER): Payer: Medicare Other

## 2016-06-06 ENCOUNTER — Telehealth (HOSPITAL_COMMUNITY): Payer: Self-pay | Admitting: *Deleted

## 2016-06-06 DIAGNOSIS — I493 Ventricular premature depolarization: Secondary | ICD-10-CM | POA: Diagnosis not present

## 2016-06-06 DIAGNOSIS — I509 Heart failure, unspecified: Secondary | ICD-10-CM

## 2016-06-06 NOTE — Telephone Encounter (Signed)
Patients insurance plan does not require pre cert for CMRI.  Message sent to [facility] to schedule pt.   [employer] case #[id number]

## 2016-06-07 ENCOUNTER — Encounter: Payer: Self-pay | Admitting: Cardiology

## 2016-06-08 ENCOUNTER — Telehealth: Payer: Self-pay

## 2016-06-08 NOTE — Telephone Encounter (Signed)
Receive pt call in triage. Pt stated she felt like she went into a-fib around 3:30 AM this morning. At this time now, she feels she not in a-fib.  Pt is scheduled to remove the halter monitor at 9:00 AM this morning. Pt wants to know if she should keep it on, or take it off. Pt said she is feeling weak/tired, and does not feel that she could return it today. Pt stated she left a message with Dr. Claris Gladden office to schedule an appointment to be seen today.   I advised to go ahead and take it off the monitor, and she can return it when she feels better. I explained I will forward this information to [facility], she will advise about returning the monitor. I will also forward this to Dr. Aundra Dubin and his nurse. Pt was pleasant and agreed with plan.

## 2016-06-08 NOTE — Telephone Encounter (Signed)
Pt aware. Pt reports she is not feeling well and would much like to come in for OV. Monitor returned 9/28 Add on 9/29 @930  [facility]LEAN

## 2016-06-08 NOTE — Telephone Encounter (Signed)
Turn in monitor when able to.  May need to go on Tikosyn in the future to prevent atrial fibrillation.  See if we can fit her in tomorrow for evaluation if she is still feeling bad.

## 2016-06-09 ENCOUNTER — Other Ambulatory Visit (HOSPITAL_COMMUNITY): Payer: Medicare Other

## 2016-06-09 ENCOUNTER — Ambulatory Visit (HOSPITAL_COMMUNITY)
Admission: RE | Admit: 2016-06-09 | Discharge: 2016-06-09 | Disposition: A | Discharge status: Home / Self Care | Payer: Medicare Other | Source: Ambulatory Visit | Attending: Cardiology | Admitting: Cardiology

## 2016-06-09 VITALS — BP 104/70 | HR 90 | Wt 139.0 lb

## 2016-06-09 DIAGNOSIS — I4581 Long QT syndrome: Secondary | ICD-10-CM | POA: Diagnosis not present

## 2016-06-09 DIAGNOSIS — I480 Paroxysmal atrial fibrillation: Secondary | ICD-10-CM | POA: Insufficient documentation

## 2016-06-09 DIAGNOSIS — I429 Cardiomyopathy, unspecified: Secondary | ICD-10-CM | POA: Insufficient documentation

## 2016-06-09 DIAGNOSIS — Z7902 Long term (current) use of antithrombotics/antiplatelets: Secondary | ICD-10-CM | POA: Diagnosis not present

## 2016-06-09 DIAGNOSIS — I5022 Chronic systolic (congestive) heart failure: Secondary | ICD-10-CM | POA: Diagnosis not present

## 2016-06-09 DIAGNOSIS — I493 Ventricular premature depolarization: Secondary | ICD-10-CM | POA: Diagnosis not present

## 2016-06-09 DIAGNOSIS — I4891 Unspecified atrial fibrillation: Secondary | ICD-10-CM

## 2016-06-09 DIAGNOSIS — R5383 Other fatigue: Secondary | ICD-10-CM | POA: Diagnosis not present

## 2016-06-09 DIAGNOSIS — I5043 Acute on chronic combined systolic (congestive) and diastolic (congestive) heart failure: Secondary | ICD-10-CM

## 2016-06-09 DIAGNOSIS — E785 Hyperlipidemia, unspecified: Secondary | ICD-10-CM | POA: Insufficient documentation

## 2016-06-09 DIAGNOSIS — I110 Hypertensive heart disease with heart failure: Secondary | ICD-10-CM | POA: Diagnosis not present

## 2016-06-09 LAB — BASIC METABOLIC PANEL
Anion gap: 6 (ref 5–15)
BUN: 21 mg/dL — ABNORMAL HIGH (ref 6–20)
CO2: 26 mmol/L (ref 22–32)
Calcium: 9.4 mg/dL (ref 8.9–10.3)
Chloride: 104 mmol/L (ref 101–111)
Creatinine, Ser: 0.92 mg/dL (ref 0.44–1.00)
GFR calc Af Amer: >60 mL/min (ref >60)
GFR calc non Af Amer: 58 mL/min — ABNORMAL LOW (ref >60)
Glucose, Bld: 112 mg/dL — ABNORMAL HIGH (ref 65–99)
Potassium: 4.4 mmol/L (ref 3.5–5.1)
Sodium: 136 mmol/L (ref 135–145)

## 2016-06-09 LAB — BRAIN NATRIURETIC PEPTIDE: B Natriuretic Peptide: 292.4 pg/mL — ABNORMAL HIGH (ref 0.0–100.0)

## 2016-06-09 MED ORDER — BISOPROLOL FUMARATE 5 MG PO TABS: 2.5000 mg | ORAL_TABLET | Freq: Every day | ORAL | 3 refills | Status: DC

## 2016-06-09 NOTE — Patient Instructions (Signed)
Start Bisoprolol 2.5 mg (1/2 tab) daily at bedtime  Labs today  You have been referred to Dr Curt Bears, they will call you for an appointment next week  Your physician recommends that you schedule a follow-up appointment in: 1 month

## 2016-06-11 NOTE — Progress Notes (Signed)
[facility]  Primary Care: Dr Cheryl Vargas HF Cardiology: Dr. Aundra Vargas  HPI: 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 Cardiomyopathy dates back to at least 2014 based on echoes (EF 30-35% in 2014).   Admitted 8/17 for fatigue and dyspnea.  She was seen by electrophysiology on 04/17/16, and was started on po Amiodarone as she reported increased palpitations and fatigue with frequent PVCs. Her BNP was elevated at 1592.6 and chest x ray was consistent with CHF.  She was diuresed.  With her complaints of new onset fatigue and dyspnea combined with her known LV dysfunction, it was felt that she would benefit from an ischemic evaluation by cath. She underwent left heart cath with normal cors. Amiodarone was later stopped due to nausea.  Discharge weight 143 pounds. Echo (8/17) with EF 40-45%, inferoseptal akinesis.   She has been intolerant of beta blockers due to profound fatigue.  At baseline, complains of fatigue. SOB with steps. Walks on the treadmill 15 minutes a day. 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  No orthopnea/PND, no bendopnea, no chest pain.  BP generally runs low in 15A-569V systolic. Yesterday, she woke up at 3:30 am with her heart racing.  HR up to 150 when she checked.  Heart was racing all day, she felt profoundly tired.  Sometime overnight, HR decreased back to normal range.  She is in NSR today.  She is still fatigued today but feels better than yesterday. Just turned in holter monitor (was wearing yesterday).   Labs (8/17): K 4.2, creatinine 0.85, BNP 1593, HCT 43.2, TSH mild elevated 5.3, free T4 normal Labs (9/17): K 4.1, creatinine 0.98, BNP 258, SPEP negative  ECG: NSR, IVCD 140 msec, QTc prolonged at 509 msec  1. Chronic systolic CHF:  Nonischemic cardiomyopathy.   - Echo (2014): EF 30-35%. - Echo (2/17): EF 40-45% - Echo (8/17): EF 40-45%, mid to apical  inferoseptal akinesis - Coronary angiography (8/17): No significant CAD.  - Unable to tolerate beta blockers.  2. Atrial fibrillation: Paroxysmal.  Diagnosed 2/17.  Unable to tolerate amiodarone.  3. Hyperlipidemia 4. PVCs: frequent.  5. Long QT interval  ROS: All systems reviewed and negative except as per HPI.   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 Occasional ETOH, never heavy.    Current Outpatient Prescriptions  Medication Sig Dispense Refill  . acetaminophen (TYLENOL) 500 MG tablet Take 500 mg by mouth every 6 (six) hours as needed for moderate pain.    . furosemide (LASIX) 20 MG tablet Take 1 tablet (20 mg total) by mouth daily. 90 tablet 3  . lisinopril (PRINIVIL,ZESTRIL) 2.5 MG tablet Take 1 tablet (2.5 mg total) by mouth daily. 90 tablet 3  . naphazoline-pheniramine (NAPHCON-A) 0.025-0.3 % ophthalmic solution Place 1 drop into both eyes daily as needed. For dry eyes    . Rivaroxaban (XARELTO) 15 MG TABS tablet Take 1 tablet (15 mg total) by mouth daily with supper. 30 tablet 11  . spironolactone (ALDACTONE) 25 MG tablet Take 0.5 tablets (12.5 mg total) by mouth daily. 15 tablet 3  . zolpidem (AMBIEN) 5 MG tablet Take 5 mg by mouth at bedtime.     . bisoprolol (ZEBETA) 5 MG tablet Take 0.5 tablets (2.5 mg total) by mouth at bedtime. 15 tablet 3   No current facility-administered medications for  this encounter.     Vitals:   06/09/16 0948  BP: 104/70  Pulse: 90  SpO2: 90%  Weight: 139 lb (63 kg)    PHYSICAL EXAM: General:  Well appearing. No respiratory difficulty HEENT: normal Neck: supple. no JVD. Carotids 2+ bilat; no bruits. No lymphadenopathy or thryomegaly appreciated. Cor: PMI nondisplaced. Regular rate & rhythm. No rubs, gallops or murmurs. Lungs: clear Abdomen: soft, nontender, nondistended. No hepatosplenomegaly. No bruits or masses. Good bowel sounds. Extremities: no cyanosis,  clubbing, rash, edema Neuro: alert & oriented x 3, cranial nerves grossly intact. moves all 4 extremities w/o difficulty. Affect pleasant.  ASSESSMENT & PLAN:  1. Chronic systolic CHF: Nonischemic cardiomyopathy.  EF 30-35% in 2014, most recent echo in 8/17 with EF 40-45%.  Recently admitted in 8/17 with CHF exacerbation.  Angiography showed no significant coronary disease.  Etiology of CMP is uncertain.  Possible viral myocarditis.  She has frequent PVCs, so cannot rule out PVC-mediated cardiomyopathy.  NYHA class II.  She is not volume overloaded. BP remains soft.   - Continue lisinopril 2.5 daily and spironolactone 12.5 daily. BMET/BNP today.  - Continue Lasix 20 mg daily.  - Unable to tolerate Coreg and Toprol XL due to fatigue.  Given suspected atrial fibrillation with RVR yesterday, will try her on bisoprolol 2.5 mg qhs (try to have something in her for rate control).  If she cannot tolerate it, she will stop it. - Awaiting cardiac MRI to assess for infiltrative disease or prior myocarditis.  2. PVCs: H/o frequent PVCs. Unable to tolerate amiodarone.  - Holter monitor turned in, awaiting report.  - As above, trying her on bisoprolol. 3. Atrial fibrillation: Paroxysmal. She is in NSR today. She likely was in atrial fibrillation with RVR yesteraday but will confirm with holter. She is very symptomatic in atrial fibrillation.  - She has not been able to take amiodarone and her QTc is too long for sotalol or Tikosyn.  She is not a candidate for Ic agents or Multaq.  I would favor evaluation for atrial fibrillation ablation, will send her back to Dr Cheryl Vargas for this.  - Will try her on bisoprolol though she has not tolerated other beta blockers.    - Continue Xarelto.   Followup in 1 month.   Cheryl Vargas 06/11/2016

## 2016-06-12 ENCOUNTER — Telehealth: Payer: Self-pay | Admitting: Cardiology

## 2016-06-12 NOTE — Telephone Encounter (Signed)
Called patient and gave her the date, time and location of cardiac MRI. ST

## 2016-06-14 ENCOUNTER — Ambulatory Visit (HOSPITAL_COMMUNITY): Admission: RE | Admit: 2016-06-14 | Payer: Medicare Other | Source: Ambulatory Visit

## 2016-06-16 ENCOUNTER — Ambulatory Visit (HOSPITAL_COMMUNITY)
Admission: RE | Admit: 2016-06-16 | Discharge: 2016-06-16 | Disposition: A | Discharge status: Home / Self Care | Payer: Medicare Other | Source: Ambulatory Visit | Attending: Cardiology | Admitting: Cardiology

## 2016-06-16 DIAGNOSIS — R931 Abnormal findings on diagnostic imaging of heart and coronary circulation: Secondary | ICD-10-CM | POA: Diagnosis not present

## 2016-06-16 DIAGNOSIS — I517 Cardiomegaly: Secondary | ICD-10-CM | POA: Insufficient documentation

## 2016-06-16 DIAGNOSIS — I509 Heart failure, unspecified: Secondary | ICD-10-CM | POA: Insufficient documentation

## 2016-06-16 DIAGNOSIS — I340 Nonrheumatic mitral (valve) insufficiency: Secondary | ICD-10-CM | POA: Diagnosis not present

## 2016-06-16 DIAGNOSIS — I429 Cardiomyopathy, unspecified: Secondary | ICD-10-CM | POA: Diagnosis not present

## 2016-06-16 MED ORDER — GADOBENATE DIMEGLUMINE 529 MG/ML IV SOLN
20.0000 mL | Freq: Once | INTRAVENOUS | Status: AC | PRN
  Administered 2016-06-16: 20 mL via INTRAVENOUS

## 2016-06-20 ENCOUNTER — Telehealth (HOSPITAL_COMMUNITY): Payer: Self-pay | Admitting: *Deleted

## 2016-06-20 NOTE — Telephone Encounter (Signed)
-----   Message from Debbora Dus sent at 06/19/2016 10:18 AM EDT ----- Juluis Rainier: Pt has decided to wait on making appt for consult to discuss ablation. Will call back when ready. Offered to make appt, and let her know if was to just discuss it and answer any questions, she refused.  Melissa

## 2016-06-20 NOTE — Telephone Encounter (Signed)
Received below mess from Dr Jackalyn Lombard office, will send to Dr Aundra Dubin for review, pt is sch to f/u here on 10/30

## 2016-06-23 ENCOUNTER — Encounter (HOSPITAL_COMMUNITY): Payer: Medicare Other

## 2016-07-10 ENCOUNTER — Other Ambulatory Visit (HOSPITAL_COMMUNITY): Payer: Self-pay | Admitting: *Deleted

## 2016-07-10 ENCOUNTER — Ambulatory Visit (HOSPITAL_COMMUNITY)
Admission: RE | Admit: 2016-07-10 | Discharge: 2016-07-10 | Disposition: A | Discharge status: Home / Self Care | Payer: Medicare Other | Source: Ambulatory Visit | Attending: Cardiology | Admitting: Cardiology

## 2016-07-10 VITALS — BP 118/60 | HR 75 | Wt 141.8 lb

## 2016-07-10 DIAGNOSIS — I480 Paroxysmal atrial fibrillation: Secondary | ICD-10-CM | POA: Diagnosis not present

## 2016-07-10 DIAGNOSIS — Z79899 Other long term (current) drug therapy: Secondary | ICD-10-CM | POA: Diagnosis not present

## 2016-07-10 DIAGNOSIS — I429 Cardiomyopathy, unspecified: Secondary | ICD-10-CM | POA: Insufficient documentation

## 2016-07-10 DIAGNOSIS — I493 Ventricular premature depolarization: Secondary | ICD-10-CM

## 2016-07-10 DIAGNOSIS — I110 Hypertensive heart disease with heart failure: Secondary | ICD-10-CM | POA: Diagnosis present

## 2016-07-10 DIAGNOSIS — I5022 Chronic systolic (congestive) heart failure: Secondary | ICD-10-CM | POA: Diagnosis present

## 2016-07-10 MED ORDER — BISOPROLOL FUMARATE 5 MG PO TABS: 2.5000 mg | ORAL_TABLET | Freq: Every day | ORAL | 3 refills | Status: DC

## 2016-07-10 NOTE — Progress Notes (Signed)
Advanced Heart Failure Medication Review by a Pharmacist  Does the patient  feel that his/her medications are working for him/her?  yes  Has the patient been experiencing any side effects to the medications prescribed?  no  Does the patient measure his/her own blood pressure or blood glucose at home?  yes   Does the patient have any problems obtaining medications due to transportation or finances?   no  Understanding of regimen: good Understanding of indications: good Potential of compliance: good Patient understands to avoid NSAIDs. Patient understands to avoid decongestants.  Issues to address at subsequent visits: None   Pharmacist comments:  Ms. Graff is a pleasant 81 yo F presenting without a medication list but with good recall of her regimen. She reports good compliance with her regimen but did state that she had trouble getting in contact with [place] last week for a refill on her bisoprolol through [facility] mail order. We did send it in today but she will need to get it through [facility] since she is out and the mail order pharmacy will not be able to send it to her for 5-7 days.   Ruta Hinds. Velva Harman, PharmD, BCPS, CPP Clinical Pharmacist Pager: [phone] Phone: [phone] 07/10/2016 12:12 PM      Time with patient: 10 minutes Preparation and documentation time: 2 minutes Total time: 12 minutes

## 2016-07-10 NOTE — Patient Instructions (Signed)
STOP Spironolactone.  Will refer you to electrophysiology at [facility]. Address: [address] ([place]), [place], [place]  [address]  Phone: [phone]  Follow up 2 months with Dr. Aundra Dubin.  Do the following things EVERYDAY: 1) Weigh yourself in the morning before breakfast. Write it down and keep it in a log. 2) Take your medicines as prescribed 3) Eat low salt foods-Limit salt (sodium) to 2000 mg per day.  4) Stay as active as you can everyday 5) Limit all fluids for the day to less than 2 liters

## 2016-07-11 NOTE — Progress Notes (Signed)
[facility]  Primary Care: Dr Laurann Montana HF Cardiology: Dr. 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 Cardiomyopathy dates back to at least 2014 based on echoes (EF 30-35% in 2014).   Admitted 8/17 for fatigue and dyspnea.  She was seen by electrophysiology on 04/17/16, and was started on po Amiodarone as she reported increased palpitations and fatigue with frequent PVCs. Her BNP was elevated at 1592.6 and chest x ray was consistent with CHF.  She was diuresed.  With her complaints of new onset fatigue and dyspnea combined with her known LV dysfunction, it was felt that she would benefit from an ischemic evaluation by cath. She underwent left heart cath with normal cors. Amiodarone was later stopped due to nausea.  Discharge weight 143 pounds. Echo (8/17) with EF 40-45%, inferoseptal akinesis. Cardiac MRI 10/17 with EF 36%, LGE mid wall pattern in the basal to mid septum and inferior wall. Hotler monitor in 9/17 with runs of atrial fibrillation/RVR.  She continues to feel "tired all the time."  Fatigue is her main complaint.  Gets so tired that she has to take a mid-day nap. She feels like taking spironolactone makes this worse.  She still feels palpitations, more fatigued when her heart races/palpitates.  SBP in 90s predominantly but no lightheadedness or syncope. Dyspnea walking up steps, ok on flat ground.    Labs (8/17): K 4.2, creatinine 0.85, BNP 1593, HCT 43.2, TSH mild elevated 5.3, free T4 normal Labs (9/17): K 4.1, creatinine 0.98 => 0.92, BNP 258, SPEP negative  ECG: NSR, frequent PVCs, IVCD 134 msec, QTc 497 msec.   1. Chronic systolic CHF:  Nonischemic cardiomyopathy.   - Echo (2014): EF 30-35%. - Echo (2/17): EF 40-45% - Echo (8/17): EF 40-45%, mid to apical inferoseptal akinesis - Coronary angiography (8/17): No significant CAD.  -  Cardiac MRI (10/17): EF 36%, moderate MR, normal RV size and systolic function, mid-wall LGE in the basal to mid septum and inferior wall.  2. Atrial fibrillation: Paroxysmal.  Diagnosed 2/17.  Unable to tolerate amiodarone.  - Holter (9/17): atrial fibrillation with RVR runs, 5% PVCs.  3. Hyperlipidemia 4. PVCs: frequent.  5. Long QT interval  ROS: All systems reviewed and negative except as per HPI.   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 Occasional ETOH, never heavy.    Current Outpatient Prescriptions  Medication Sig Dispense Refill  . acetaminophen (TYLENOL) 500 MG tablet Take 500 mg by mouth every 6 (six) hours as needed for moderate pain.    . bisoprolol (ZEBETA) 5 MG tablet Take 0.5 tablets (2.5 mg total) by mouth at bedtime. 45 tablet 3  . furosemide (LASIX) 20 MG tablet Take 1 tablet (20 mg total) by mouth daily. 90 tablet 3  . lisinopril (PRINIVIL,ZESTRIL) 2.5 MG tablet Take 1 tablet (2.5 mg total) by mouth daily. 90 tablet 3  . naphazoline-pheniramine (NAPHCON-A) 0.025-0.3 % ophthalmic solution Place 1 drop into both eyes daily as needed. For dry eyes    . Rivaroxaban (XARELTO) 15 MG TABS tablet Take 1 tablet (15 mg total) by mouth daily with supper. 30 tablet 11  . zolpidem (AMBIEN) 5 MG tablet Take 5 mg by mouth at bedtime.      No current facility-administered medications for this encounter.  Vitals:   07/10/16 1131  BP: 118/60  Pulse: 75  SpO2: 99%  Weight: 141 lb 12.8 oz (64.3 kg)    PHYSICAL EXAM: General:  Well appearing. No respiratory difficulty HEENT: normal Neck: supple. no JVD. Carotids 2+ bilat; no bruits. No lymphadenopathy or thryomegaly appreciated. Cor: PMI nondisplaced. Regular rate & rhythm. No rubs, gallops or murmurs. Lungs: clear Abdomen: soft, nontender, nondistended. No hepatosplenomegaly. No bruits or masses. Good bowel sounds. Extremities: no cyanosis, clubbing,  rash, edema Neuro: alert & oriented x 3, cranial nerves grossly intact. moves all 4 extremities w/o difficulty. Affect pleasant.  ASSESSMENT & PLAN:  1. Chronic systolic CHF: Nonischemic cardiomyopathy.  EF 30-35% in 2014, most recent echo in 8/17 with EF 40-45%.  Recently admitted in 8/17 with CHF exacerbation.  Angiography showed no significant coronary disease.  Etiology of CMP is uncertain.  Possible viral myocarditis => caridiac MRI in 10/17 showed EF 36% and LGE pattern that could be consistent with prior myocarditis.  Not having enough PVCs to cause PVC-mediated cardiomyopathy.  NYHA class II, more limited by fatigue than anything else.  She is not volume overloaded. BP remains soft, SBP 80s-90s.  Feels worse when she takes spironolactone.  .   - Continue lisinopril 2.5 daily and bisoprolol 2.5 daily, both taken in the evening.  - Will stop spironolactone for now.  - Continue Lasix 20 mg daily.  2. PVCs: H/o frequent PVCs, however only 5% PVCs noted on 9/17 holter.    3. Atrial fibrillation: Paroxysmal. She is in NSR today. She had runs of atrial fibrillation with RVR on holter in 9/17. She is very symptomatic in atrial fibrillation.  - She has not been able to take amiodarone and her QTc is too long for sotalol or Tikosyn.  She is not a candidate for Ic agents or Multaq.  I would favor evaluation for atrial fibrillation ablation, will send her to EP for this.  - Continue bisoprolol.    - Continue Xarelto.   Followup in 2 months.   [name] 07/11/2016

## 2016-07-20 ENCOUNTER — Ambulatory Visit: Payer: Medicare Other | Admitting: Cardiology

## 2016-08-07 ENCOUNTER — Telehealth (HOSPITAL_COMMUNITY): Payer: Self-pay | Admitting: Cardiology

## 2016-08-07 NOTE — Telephone Encounter (Signed)
Keep lisinopril at 1.25 mg qhs

## 2016-08-07 NOTE — Telephone Encounter (Signed)
PATIENT LEFT VOICEMAIL WITH CONCERNS REGARDING DIPS IN B/P   Returned call and patient reports b/p was dropping over the past few days (74/50, 70/48). Reports she decreased her lisinopril down by half Lisinopril 1.25mg  qHS and she FEELS A LOT BETTER b/p 120/70 this AM.  Advised she should keep a record of b/p reading, continue current dose until advised otherwise, may need labs. Routine follow up scheduled for 09/12/16  Please advised further

## 2016-08-08 MED ORDER — LISINOPRIL 2.5 MG PO TABS: 1.2500 mg | ORAL_TABLET | Freq: Every day | ORAL | 3 refills | Status: DC

## 2016-08-08 NOTE — Telephone Encounter (Signed)
Pt aware.

## 2016-09-12 ENCOUNTER — Encounter (HOSPITAL_COMMUNITY): Payer: Medicare Other

## 2016-09-27 ENCOUNTER — Other Ambulatory Visit (HOSPITAL_COMMUNITY): Payer: Self-pay | Admitting: Pharmacist

## 2016-09-27 MED ORDER — RIVAROXABAN 15 MG PO TABS: 15.0000 mg | ORAL_TABLET | Freq: Every day | ORAL | 11 refills | Status: DC

## 2016-09-29 ENCOUNTER — Other Ambulatory Visit (HOSPITAL_COMMUNITY): Payer: Self-pay | Admitting: *Deleted

## 2016-09-29 ENCOUNTER — Other Ambulatory Visit (HOSPITAL_COMMUNITY): Payer: Self-pay | Admitting: Pharmacist

## 2016-09-29 MED ORDER — BISOPROLOL FUMARATE 5 MG PO TABS: 2.5000 mg | ORAL_TABLET | Freq: Every day | ORAL | 11 refills | Status: DC

## 2016-10-19 ENCOUNTER — Ambulatory Visit (HOSPITAL_COMMUNITY)
Admission: RE | Admit: 2016-10-19 | Discharge: 2016-10-19 | Disposition: A | Discharge status: Home / Self Care | Payer: PPO | Source: Ambulatory Visit | Attending: Cardiology | Admitting: Cardiology

## 2016-10-19 ENCOUNTER — Telehealth: Payer: Self-pay | Admitting: Cardiology

## 2016-10-19 ENCOUNTER — Encounter (HOSPITAL_COMMUNITY): Payer: Self-pay

## 2016-10-19 VITALS — BP 116/60 | HR 66 | Wt 143.8 lb

## 2016-10-19 DIAGNOSIS — I5022 Chronic systolic (congestive) heart failure: Secondary | ICD-10-CM | POA: Insufficient documentation

## 2016-10-19 DIAGNOSIS — Z79899 Other long term (current) drug therapy: Secondary | ICD-10-CM | POA: Diagnosis not present

## 2016-10-19 DIAGNOSIS — I110 Hypertensive heart disease with heart failure: Secondary | ICD-10-CM | POA: Insufficient documentation

## 2016-10-19 DIAGNOSIS — I517 Cardiomegaly: Secondary | ICD-10-CM | POA: Diagnosis not present

## 2016-10-19 DIAGNOSIS — I4891 Unspecified atrial fibrillation: Secondary | ICD-10-CM | POA: Diagnosis not present

## 2016-10-19 DIAGNOSIS — I480 Paroxysmal atrial fibrillation: Secondary | ICD-10-CM | POA: Insufficient documentation

## 2016-10-19 DIAGNOSIS — I429 Cardiomyopathy, unspecified: Secondary | ICD-10-CM | POA: Diagnosis not present

## 2016-10-19 LAB — CBC
HCT: 40.8 % (ref 36.0–46.0)
Hemoglobin: 14.1 g/dL (ref 12.0–15.0)
MCH: 32 pg (ref 26.0–34.0)
MCHC: 34.6 g/dL (ref 30.0–36.0)
MCV: 92.7 fL (ref 78.0–100.0)
Platelets: 195 10*3/uL (ref 150–400)
RBC: 4.4 MIL/uL (ref 3.87–5.11)
RDW: 11.9 % (ref 11.5–15.5)
WBC: 6.6 10*3/uL (ref 4.0–10.5)

## 2016-10-19 LAB — BASIC METABOLIC PANEL
Anion gap: 9 (ref 5–15)
BUN: 17 mg/dL (ref 6–20)
CO2: 28 mmol/L (ref 22–32)
Calcium: 9.3 mg/dL (ref 8.9–10.3)
Chloride: 101 mmol/L (ref 101–111)
Creatinine, Ser: 0.88 mg/dL (ref 0.44–1.00)
GFR calc Af Amer: >60 mL/min (ref >60)
GFR calc non Af Amer: >60 mL/min (ref >60)
Glucose, Bld: 98 mg/dL (ref 65–99)
Potassium: 4 mmol/L (ref 3.5–5.1)
Sodium: 138 mmol/L (ref 135–145)

## 2016-10-19 MED ORDER — LISINOPRIL 2.5 MG PO TABS: 1.2500 mg | ORAL_TABLET | Freq: Two times a day (BID) | ORAL | 3 refills | Status: DC

## 2016-10-19 NOTE — Progress Notes (Signed)
[facility]  Primary Care: Dr Laurann Montana HF Cardiology: Dr. 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 Cardiomyopathy dates back to at least 2014 based on echoes (EF 30-35% in 2014).   Admitted 8/17 for fatigue and dyspnea.  She was seen by electrophysiology on 04/17/16, and was started on po Amiodarone as she reported increased palpitations and fatigue with frequent PVCs. Her BNP was elevated at 1592.6 and chest x ray was consistent with CHF.  She was diuresed.  With her complaints of new onset fatigue and dyspnea combined with her known LV dysfunction, it was felt that she would benefit from an ischemic evaluation by cath. She underwent left heart cath with normal cors. Amiodarone was later stopped due to nausea.  Discharge weight 143 pounds. Echo (8/17) with EF 40-45%, inferoseptal akinesis. Cardiac MRI 10/17 with EF 36%, LGE mid wall pattern in the basal to mid septum and inferior wall. Holter monitor in 9/17 with runs of atrial fibrillation/RVR.  At last appointment, stopped spironolactone because she said it was making her feel worse.  She is doing well today.  Able to walk 1/2 mile without dyspnea.  No orthopnea/PND.  No lightheadedness or syncope.  No chest pain.  In NSR today.   Labs (8/17): K 4.2, creatinine 0.85, BNP 1593, HCT 43.2, TSH mild elevated 5.3, free T4 normal Labs (9/17): K 4.1, creatinine 0.98 => 0.92, BNP 258, SPEP negative  ECG: NSR, PVC, IVCD 138 msec   1. Chronic systolic CHF:  Nonischemic cardiomyopathy.   - Echo (2014): EF 30-35%. - Echo (2/17): EF 40-45% - Echo (8/17): EF 40-45%, mid to apical inferoseptal akinesis - Coronary angiography (8/17): No significant CAD.  - Cardiac MRI (10/17): EF 36%, moderate MR, normal RV size and systolic function, mid-wall LGE in the basal to mid septum and inferior wall.  2. Atrial  fibrillation: Paroxysmal.  Diagnosed 2/17.  Unable to tolerate amiodarone.  - Holter (9/17): atrial fibrillation with RVR runs, 5% PVCs.  3. Hyperlipidemia 4. PVCs: frequent.  5. Long QT interval  ROS: All systems reviewed and negative except as per HPI.   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 Occasional ETOH, never heavy.    Current Outpatient Prescriptions  Medication Sig Dispense Refill  . acetaminophen (TYLENOL) 500 MG tablet Take 500 mg by mouth every 6 (six) hours as needed for moderate pain.    . bisoprolol (ZEBETA) 5 MG tablet Take 0.5 tablets (2.5 mg total) by mouth at bedtime. 30 tablet 11  . furosemide (LASIX) 20 MG tablet Take 1 tablet (20 mg total) by mouth daily. 90 tablet 3  . lisinopril (PRINIVIL,ZESTRIL) 2.5 MG tablet Take 0.5 tablets (1.25 mg total) by mouth 2 (two) times daily. 90 tablet 3  . naphazoline-pheniramine (NAPHCON-A) 0.025-0.3 % ophthalmic solution Place 1 drop into both eyes daily as needed. For dry eyes    . Rivaroxaban (XARELTO) 15 MG TABS tablet Take 1 tablet (15 mg total) by mouth daily with supper. 30 tablet 11  . zolpidem (AMBIEN) 5 MG tablet Take 5 mg by mouth at bedtime.      No current facility-administered medications for this encounter.     Vitals:   10/19/16 1022  BP: 116/60  Pulse: 66  SpO2: 100%  Weight: 143 lb 12 oz (65.2  kg)    PHYSICAL EXAM: General:  Well appearing. No respiratory difficulty HEENT: normal Neck: supple. no JVD. Carotids 2+ bilat; no bruits. No lymphadenopathy or thryomegaly appreciated. Cor: PMI nondisplaced. Regular rate & rhythm. No rubs, gallops or murmurs. Lungs: clear Abdomen: soft, nontender, nondistended. No hepatosplenomegaly. No bruits or masses. Good bowel sounds. Extremities: no cyanosis, clubbing, rash, edema Neuro: alert & oriented x 3, cranial nerves grossly intact. moves all 4 extremities w/o difficulty. Affect  pleasant.  ASSESSMENT & PLAN:  1. Chronic systolic CHF: Nonischemic cardiomyopathy.  EF 30-35% in 2014, most recent echo in 8/17 with EF 40-45%.  Admitted in 8/17 with CHF exacerbation.  Angiography showed no significant coronary disease.  Etiology of CMP is uncertain.  Possible viral myocarditis => cardiac MRI in 10/17 showed EF 36% and LGE pattern that could be consistent with prior myocarditis.  Not having enough PVCs to cause PVC-mediated cardiomyopathy.  NYHA class II, doing better symptomatically and less fatigued.  She is not volume overloaded.  She has had trouble tolerating even low doses of cardiac meds.  - Continue bisoprolol 2.5 daily.  - She did not tolerate even low dose spironolactone.    - Increase lisinopril to 1.25 mg bid.  BMET today and repeat in 2 wks.  - Continue Lasix 20 mg daily.  2. PVCs: H/o frequent PVCs, however only 5% PVCs noted on 9/17 holter.    3. Atrial fibrillation: Paroxysmal. She is in NSR today. She had runs of atrial fibrillation with RVR on holter in 9/17. She is very symptomatic when in atrial fibrillation.  - She has not been able to take amiodarone and her QTc has been too long for sotalol or Tikosyn.  She is not a candidate for Ic agents or Multaq.  I would favor evaluation for atrial fibrillation ablation, will send her to EP for this (waiting for appt).  - Continue bisoprolol.    - Continue Xarelto. CBC today.   Followup in 3 months.   [name] 10/19/2016

## 2016-10-19 NOTE — Patient Instructions (Signed)
Increase Lisinopril 1.25 mg (1/2 tab) Twice daily   Labs today  Labs in 2 weeks  You have been referred to Dr Rayann Heman  Your physician recommends that you schedule a follow-up appointment in: 3 months

## 2016-10-19 NOTE — Telephone Encounter (Signed)
Per Arbutus Leas, pt wants to switch from [facility] to Allred because one of her family members see's him

## 2016-10-25 ENCOUNTER — Other Ambulatory Visit: Payer: Self-pay | Admitting: Cardiology

## 2016-10-25 NOTE — Telephone Encounter (Signed)
I am happy to see if Dr Curt Bears is ok with this change.

## 2016-11-02 ENCOUNTER — Ambulatory Visit (HOSPITAL_COMMUNITY)
Admission: RE | Admit: 2016-11-02 | Discharge: 2016-11-02 | Disposition: A | Discharge status: Home / Self Care | Payer: PPO | Source: Ambulatory Visit | Attending: Cardiology | Admitting: Cardiology

## 2016-11-02 DIAGNOSIS — I517 Cardiomegaly: Secondary | ICD-10-CM | POA: Diagnosis not present

## 2016-11-02 DIAGNOSIS — I4891 Unspecified atrial fibrillation: Secondary | ICD-10-CM | POA: Diagnosis not present

## 2016-11-02 LAB — BASIC METABOLIC PANEL
Anion gap: 8 (ref 5–15)
BUN: 17 mg/dL (ref 6–20)
CO2: 26 mmol/L (ref 22–32)
Calcium: 9 mg/dL (ref 8.9–10.3)
Chloride: 102 mmol/L (ref 101–111)
Creatinine, Ser: 0.83 mg/dL (ref 0.44–1.00)
GFR calc Af Amer: >60 mL/min (ref >60)
GFR calc non Af Amer: >60 mL/min (ref >60)
Glucose, Bld: 101 mg/dL — ABNORMAL HIGH (ref 65–99)
Potassium: 4 mmol/L (ref 3.5–5.1)
Sodium: 136 mmol/L (ref 135–145)

## 2016-11-13 ENCOUNTER — Ambulatory Visit (INDEPENDENT_AMBULATORY_CARE_PROVIDER_SITE_OTHER): Payer: PPO | Admitting: Internal Medicine

## 2016-11-13 ENCOUNTER — Encounter: Payer: Self-pay | Admitting: Internal Medicine

## 2016-11-13 VITALS — BP 122/72 | HR 66 | Ht 64.0 in | Wt 144.5 lb

## 2016-11-13 DIAGNOSIS — I4891 Unspecified atrial fibrillation: Secondary | ICD-10-CM

## 2016-11-13 NOTE — Patient Instructions (Signed)
Medication Instructions:  Your physician recommends that you continue on your current medications as directed. Please refer to the Current Medication list given to you today.   Labwork: None ordered   Testing/Procedures: None ordered   Follow-Up: Your physician wants you to follow-up : As needed with Dr. Allred   Any Other Special Instructions Will Be Listed Below (If Applicable).     If you need a refill on your cardiac medications before your next appointment, please call your pharmacy.   

## 2016-11-13 NOTE — Progress Notes (Signed)
Electrophysiology Office Note   Date:  11/13/2016   ID:  Cheryl Vargas, DOB 01-01-1935, MRN [id number]  PCP:  Irven Shelling, MD  Cardiologist:  Dr Aundra Dubin Primary Electrophysiologist: Dr Curt Bears  Chief Complaint  Patient presents with  . Atrial Fibrillation     History of Present Illness: Cheryl Vargas is a 82 y.o. female who presents today for electrophysiology evaluation.   The patient has persistent atrial fibrillation.  She has been followed by Dr Aundra Dubin.  She has seen Dr Curt Bears previously and started on amiodarone.  She did not tolerate amiodarone due to nausea.  She has severe LA enlargement and therefore ablation was not pursued.  She is anticoagulated currently.  She is tolerating this without difficult.  She also has occasional PVCs.  Currently, she feels well.  She has not had recent episodes of afib.  Today, she denies symptoms of palpitations, chest pain, shortness of breath, orthopnea, PND, lower extremity edema, claudication, dizziness, presyncope, syncope, bleeding, or neurologic sequela. The patient is tolerating medications without difficulties and is otherwise without complaint today.    Past Medical History:  Diagnosis Date  . Arthritis   . Chronic combined systolic (congestive) and diastolic (congestive) heart failure   . Dysrhythmia    pvc's per pt  . Enlarged heart   . Paroxysmal a-fib ([place])   . PONV (postoperative nausea and vomiting)    Past Surgical History:  Procedure Laterality Date  . BUNIONECTOMY    . CARDIAC CATHETERIZATION     in 2004 at [facility]. "Insignificant blockage" per patient  . CARDIAC CATHETERIZATION N/A 04/25/2016   Procedure: Left Heart Cath and Coronary Angiography;  Surgeon: Nelva Bush, MD;  Location: [place] [facility];  Service: Cardiovascular;  Laterality: N/A;  . SHOULDER SURGERY     closed reduction  . TONSILLECTOMY    . TOTAL HIP ARTHROPLASTY Left 03/18/2013   Procedure: TOTAL HIP ARTHROPLASTY ANTERIOR  APPROACH;  Surgeon: Mauri Pole, MD;  Location: [facility];  Service: Orthopedics;  Laterality: Left;  . TUBAL LIGATION       Current Outpatient Prescriptions  Medication Sig Dispense Refill  . acetaminophen (TYLENOL) 500 MG tablet Take 500 mg by mouth every 6 (six) hours as needed for moderate pain.    . bisoprolol (ZEBETA) 5 MG tablet Take 0.5 tablets (2.5 mg total) by mouth at bedtime. 30 tablet 11  . furosemide (LASIX) 20 MG tablet Take 1 tablet (20 mg total) by mouth daily. 90 tablet 3  . lisinopril (PRINIVIL,ZESTRIL) 2.5 MG tablet Take 0.5 tablets (1.25 mg total) by mouth 2 (two) times daily. 90 tablet 3  . naphazoline-pheniramine (NAPHCON-A) 0.025-0.3 % ophthalmic solution Place 1 drop into both eyes daily as needed. For dry eyes    . Rivaroxaban (XARELTO) 15 MG TABS tablet Take 1 tablet (15 mg total) by mouth daily with supper. 30 tablet 11  . zolpidem (AMBIEN) 5 MG tablet Take 5 mg by mouth at bedtime.      No current facility-administered medications for this visit.     Allergies:   Alendronate sodium; Amiodarone; Coreg [carvedilol]; and Metoprolol   Social History:  The patient  reports that she has never smoked. She has never used smokeless tobacco. She reports that she drinks about 1.8 oz of alcohol per week . She reports that she does not use drugs.   Family History:  The patient's  family history includes Heart attack in her father; Lung cancer in her mother.    ROS:  Please see the history of present illness.   All other systems are personally reviewed and negative.    PHYSICAL EXAM: VS:  BP 122/72   Pulse 66   Ht 5\' 4"  (1.626 m)   Wt 144 lb 8 oz (65.5 kg)   SpO2 97%   BMI 24.80 kg/m  , BMI Body mass index is 24.8 kg/m. GEN: Well nourished, well developed, in no acute distress  HEENT: normal  Neck: no JVD, carotid bruits, or masses Cardiac: RRR; no murmurs, rubs, or gallops,no edema  Respiratory:  clear to auscultation bilaterally, normal work of breathing GI:  soft, nontender, nondistended, + BS MS: no deformity or atrophy  Skin: warm and dry  Neuro:  Strength and sensation are intact Psych: euthymic mood, full affect  EKG:  EKG is ordered today. The ekg ordered today is personally reviewed and shows sinus rhythm 66 bpm, incomplete RBBB, Qtc 517 msce   Recent Labs: 04/23/2016: ALT 23 06/09/2016: B Natriuretic Peptide 292.4 10/19/2016: Hemoglobin 14.1; Platelets 195 11/02/2016: BUN 17; Creatinine, Ser 0.83; Potassium 4.0; Sodium 136  personally reviewed   Lipid Panel  No results found for: CHOL, TRIG, HDL, CHOLHDL, VLDL, LDLCALC, LDLDIRECT personally reviewed   Wt Readings from Last 3 Encounters:  11/13/16 144 lb 8 oz (65.5 kg)  10/19/16 143 lb 12 oz (65.2 kg)  07/10/16 141 lb 12.8 oz (64.3 kg)      Other studies personally reviewed: Additional studies/ records that were reviewed today include: Dr Macky Lower note, prior echo, Dr Oleh Genin notes  Review of the above records today demonstrates: as above   ASSESSMENT AND PLAN:  1.  Paroxysmal atrial fibrillation The patient has symptomatic recurrent paroxysmal atrial fibrillation.  She also has significant atriopathy with severe LA enlargement.  She has not tolerated amiodarone previously.  I agree with Dr Aundra Dubin that she does not have very many AAD options.  I agree that ablation is a reasonable options though success rates are reduced with her atrial enlargement. Therapeutic strategies for afib including medicine and ablation were discussed in detail with the patient today. Risk, benefits, and alternatives to EP study and radiofrequency ablation for afib were also discussed in detail today.  At this time, she is not ready to pursue ablation.  She would  Prefer to continue her current medical strategy.  If her arrhythmias worsen then she may reconsider  2. PVCs Stable No change required today  3. Nonischemic CM Stable No change required today  Follow-up with Dr Aundra Dubin as scheduled.  I  am happy to see again in the future if she decides to reconsider ablation.  Current medicines are reviewed at length with the patient today.   The patient does not have concerns regarding her medicines.  The following changes were made today:  none  Labs/ tests ordered today include:  Orders Placed This Encounter  Procedures  . EKG 12-Lead     Signed, [name], MD  11/13/2016 4:07 PM     [facility] [address] [place] [address] [phone] (office) [phone] (fax)

## 2016-11-23 ENCOUNTER — Other Ambulatory Visit (HOSPITAL_COMMUNITY): Payer: Self-pay | Admitting: Cardiology

## 2016-11-23 MED ORDER — FUROSEMIDE 20 MG PO TABS: 20.0000 mg | ORAL_TABLET | Freq: Every day | ORAL | 3 refills | Status: DC

## 2017-01-17 ENCOUNTER — Encounter (HOSPITAL_COMMUNITY): Payer: PPO

## 2017-02-09 DIAGNOSIS — Z0000 Encounter for general adult medical examination without abnormal findings: Secondary | ICD-10-CM | POA: Diagnosis not present

## 2017-02-09 DIAGNOSIS — Z1389 Encounter for screening for other disorder: Secondary | ICD-10-CM | POA: Diagnosis not present

## 2017-02-20 DIAGNOSIS — M25561 Pain in right knee: Secondary | ICD-10-CM | POA: Diagnosis not present

## 2017-02-22 DIAGNOSIS — M7121 Synovial cyst of popliteal space [Baker], right knee: Secondary | ICD-10-CM | POA: Diagnosis not present

## 2017-02-22 DIAGNOSIS — S83241A Other tear of medial meniscus, current injury, right knee, initial encounter: Secondary | ICD-10-CM | POA: Diagnosis not present

## 2017-03-05 ENCOUNTER — Encounter (HOSPITAL_COMMUNITY): Payer: Self-pay

## 2017-03-05 ENCOUNTER — Ambulatory Visit (HOSPITAL_COMMUNITY)
Admission: RE | Admit: 2017-03-05 | Discharge: 2017-03-05 | Disposition: A | Discharge status: Home / Self Care | Payer: PPO | Source: Ambulatory Visit | Attending: Cardiology | Admitting: Cardiology

## 2017-03-05 VITALS — BP 120/70 | HR 67 | Wt 143.0 lb

## 2017-03-05 DIAGNOSIS — I5042 Chronic combined systolic (congestive) and diastolic (congestive) heart failure: Secondary | ICD-10-CM

## 2017-03-05 DIAGNOSIS — I5022 Chronic systolic (congestive) heart failure: Secondary | ICD-10-CM | POA: Insufficient documentation

## 2017-03-05 DIAGNOSIS — I110 Hypertensive heart disease with heart failure: Secondary | ICD-10-CM | POA: Insufficient documentation

## 2017-03-05 DIAGNOSIS — I493 Ventricular premature depolarization: Secondary | ICD-10-CM | POA: Diagnosis not present

## 2017-03-05 DIAGNOSIS — I480 Paroxysmal atrial fibrillation: Secondary | ICD-10-CM | POA: Diagnosis not present

## 2017-03-05 DIAGNOSIS — Z7901 Long term (current) use of anticoagulants: Secondary | ICD-10-CM | POA: Diagnosis not present

## 2017-03-05 DIAGNOSIS — I429 Cardiomyopathy, unspecified: Secondary | ICD-10-CM | POA: Diagnosis not present

## 2017-03-05 DIAGNOSIS — E785 Hyperlipidemia, unspecified: Secondary | ICD-10-CM | POA: Diagnosis not present

## 2017-03-05 MED ORDER — FUROSEMIDE 20 MG PO TABS: 20.0000 mg | ORAL_TABLET | ORAL | 3 refills | Status: DC | PRN

## 2017-03-05 MED ORDER — LOSARTAN POTASSIUM 25 MG PO TABS: 12.5000 mg | ORAL_TABLET | Freq: Every day | ORAL | 3 refills | Status: DC

## 2017-03-05 NOTE — Patient Instructions (Signed)
START taking Losartan 12.5 mg (0.5 Tablet) Once daily at bedtime  Take Lasix as needed for weight gain or swelling.  Labs in 2 weeks (BMET, CBC)  Follow up in 3-4 Months

## 2017-03-05 NOTE — Progress Notes (Signed)
[facility]  Primary Care: Dr Laurann Montana HF Cardiology: Dr. 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 Cardiomyopathy dates back to at least 2014 based on echoes (EF 30-35% in 2014).   Admitted 8/17 for fatigue and dyspnea.  She was seen by electrophysiology on 04/17/16, and was started on po Amiodarone as she reported increased palpitations and fatigue with frequent PVCs. Her BNP was elevated at 1592.6 and chest x ray was consistent with CHF.  She was diuresed.  With her complaints of new onset fatigue and dyspnea combined with her known LV dysfunction, it was felt that she would benefit from an ischemic evaluation by cath. She underwent left heart cath with normal cors. Amiodarone was later stopped due to nausea.  Discharge weight 143 pounds. Echo (8/17) with EF 40-45%, inferoseptal akinesis. Cardiac MRI 10/17 with EF 36%, LGE mid wall pattern in the basal to mid septum and inferior wall. Holter monitor in 9/17 with runs of atrial fibrillation/RVR.  She did not tolerate spironolactone.  She also stopped Lasix and lisinopril due to SBP primarily in 90s with some lightheadedness.   She saw Dr. Rayann Heman and was offered atrial fibrillation ablation.  She decided to hold off on this and has had minimal palpitations recently.  She is in NSR today.   She is doing well today.  No exertional dyspnea. No longer having lightheaded spells. No orthopnea/PND. Able to climb stairs without problems.   Labs (8/17): K 4.2, creatinine 0.85, BNP 1593, HCT 43.2, TSH mild elevated 5.3, free T4 normal Labs (9/17): K 4.1, creatinine 0.98 => 0.92, BNP 258, SPEP negative Labs (2/18): K 4, creatinine 0.83, HCT 40.8  ECG: NSR, PVC, IVCD 138 msec   1. Chronic systolic CHF:  Nonischemic cardiomyopathy.   - Echo (2014): EF 30-35%. - Echo (2/17): EF 40-45% - Echo (8/17): EF 40-45%,  mid to apical inferoseptal akinesis - Coronary angiography (8/17): No significant CAD.  - Cardiac MRI (10/17): EF 36%, moderate MR, normal RV size and systolic function, mid-wall LGE in the basal to mid septum and inferior wall.  2. Atrial fibrillation: Paroxysmal.  Diagnosed 2/17.  Unable to tolerate amiodarone.  - Holter (9/17): atrial fibrillation with RVR runs, 5% PVCs.  3. Hyperlipidemia 4. PVCs: frequent.  5. Long QT interval  ROS: All systems reviewed and negative except as per HPI.   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 Occasional ETOH, never heavy.    Current Outpatient Prescriptions  Medication Sig Dispense Refill  . acetaminophen (TYLENOL) 500 MG tablet Take 500 mg by mouth every 6 (six) hours as needed for moderate pain.    . bisoprolol (ZEBETA) 5 MG tablet Take 0.5 tablets (2.5 mg total) by mouth at bedtime. 30 tablet 11  . furosemide (LASIX) 20 MG tablet Take 1 tablet (20 mg total) by mouth as needed for fluid or edema. 15 tablet 3  . naphazoline-pheniramine (NAPHCON-A) 0.025-0.3 % ophthalmic solution Place 1 drop into both eyes daily as needed. For dry eyes    . Rivaroxaban (XARELTO) 15 MG TABS tablet Take 1 tablet (15 mg total) by mouth daily with supper. 30 tablet 11  . zolpidem (AMBIEN) 5 MG tablet Take 5 mg by mouth at bedtime.     Marland Kitchen losartan (COZAAR) 25 MG tablet Take 0.5 tablets (  12.5 mg total) by mouth at bedtime. 45 tablet 3   No current facility-administered medications for this encounter.     Vitals:   03/05/17 1013  BP: 120/70  Pulse: 67  SpO2: 97%  Weight: 143 lb (64.9 kg)    PHYSICAL EXAM: General:  NAD HEENT: normal Neck: supple. JVP 7 cm. Carotids 2+ bilat; no bruits. No lymphadenopathy or thryomegaly appreciated. Cor: PMI nondisplaced. Regular rate & rhythm. No rubs, gallops or murmurs. Lungs: clear to auscultation bilaterally.  Abdomen: soft, nontender, nondistended. No  hepatosplenomegaly. No bruits or masses. Good bowel sounds. Extremities: no cyanosis, clubbing, rash, edema Neuro: alert & oriented x 3, cranial nerves grossly intact. moves all 4 extremities w/o difficulty. Affect pleasant.  ASSESSMENT & PLAN:  1. Chronic systolic CHF: Nonischemic cardiomyopathy.  EF 30-35% in 2014, most recent echo in 8/17 with EF 40-45%.  Admitted in 8/17 with CHF exacerbation.  Angiography showed no significant coronary disease.  Etiology of CMP is uncertain.  Possible viral myocarditis => cardiac MRI in 10/17 showed EF 36% and LGE pattern that could be consistent with prior myocarditis.  Has not appeared to have enough PVCs to cause PVC-mediated cardiomyopathy.  NYHA class II.  She is not volume overloaded.  She has had trouble tolerating even low doses of cardiac meds and currently only on low dose bisoprolol.  She has not tolerated lisinopril or spironolactone.  - Continue bisoprolol 2.5 daily.  - I will have her try losartan 12.5 mg to be taken at bedtime. BMET in 2 wks.    - I do not think she needs regular Lasix, continue to use prn.   2. PVCs: H/o frequent PVCs, however only 5% PVCs noted on 9/17 holter.    3. Atrial fibrillation: Paroxysmal. She is in NSR today. She had runs of atrial fibrillation with RVR on holter in 9/17. She is very symptomatic when in atrial fibrillation.  - She has not been able to take amiodarone and her QTc has been too long for sotalol or Tikosyn.  She is not a candidate for Ic agents or Multaq.   - She saw Dr. Rayann Heman and was offered ablation. She has been minimally symptomatic recently, so decided to hold off for now.   - Continue bisoprolol.    - Continue Xarelto.    Followup in 3 months.   [name] 03/05/2017

## 2017-03-08 DIAGNOSIS — I340 Nonrheumatic mitral (valve) insufficiency: Secondary | ICD-10-CM | POA: Diagnosis not present

## 2017-03-08 DIAGNOSIS — I5042 Chronic combined systolic (congestive) and diastolic (congestive) heart failure: Secondary | ICD-10-CM | POA: Diagnosis not present

## 2017-03-08 DIAGNOSIS — I351 Nonrheumatic aortic (valve) insufficiency: Secondary | ICD-10-CM | POA: Diagnosis not present

## 2017-03-08 DIAGNOSIS — M7121 Synovial cyst of popliteal space [Baker], right knee: Secondary | ICD-10-CM | POA: Diagnosis not present

## 2017-03-08 DIAGNOSIS — I480 Paroxysmal atrial fibrillation: Secondary | ICD-10-CM | POA: Diagnosis not present

## 2017-03-08 DIAGNOSIS — R350 Frequency of micturition: Secondary | ICD-10-CM | POA: Diagnosis not present

## 2017-03-19 ENCOUNTER — Ambulatory Visit (HOSPITAL_COMMUNITY)
Admission: RE | Admit: 2017-03-19 | Discharge: 2017-03-19 | Disposition: A | Discharge status: Home / Self Care | Payer: PPO | Source: Ambulatory Visit | Attending: Internal Medicine | Admitting: Internal Medicine

## 2017-03-19 DIAGNOSIS — I5042 Chronic combined systolic (congestive) and diastolic (congestive) heart failure: Secondary | ICD-10-CM | POA: Diagnosis not present

## 2017-03-19 LAB — BASIC METABOLIC PANEL
Anion gap: 6 (ref 5–15)
BUN: 8 mg/dL (ref 6–20)
CO2: 26 mmol/L (ref 22–32)
Calcium: 8.8 mg/dL — ABNORMAL LOW (ref 8.9–10.3)
Chloride: 103 mmol/L (ref 101–111)
Creatinine, Ser: 0.81 mg/dL (ref 0.44–1.00)
GFR calc Af Amer: >60 mL/min (ref >60)
GFR calc non Af Amer: >60 mL/min (ref >60)
Glucose, Bld: 88 mg/dL (ref 65–99)
Potassium: 3.7 mmol/L (ref 3.5–5.1)
Sodium: 135 mmol/L (ref 135–145)

## 2017-03-19 LAB — CBC
HCT: 40 % (ref 36.0–46.0)
Hemoglobin: 13.9 g/dL (ref 12.0–15.0)
MCH: 32.1 pg (ref 26.0–34.0)
MCHC: 34.8 g/dL (ref 30.0–36.0)
MCV: 92.4 fL (ref 78.0–100.0)
Platelets: 217 10*3/uL (ref 150–400)
RBC: 4.33 MIL/uL (ref 3.87–5.11)
RDW: 13.2 % (ref 11.5–15.5)
WBC: 5.7 10*3/uL (ref 4.0–10.5)

## 2017-04-13 ENCOUNTER — Encounter: Payer: Self-pay | Admitting: *Deleted

## 2017-04-13 ENCOUNTER — Other Ambulatory Visit: Payer: Self-pay | Admitting: *Deleted

## 2017-04-13 NOTE — Patient Outreach (Signed)
[facility]) Care Management  04/13/2017  Cheryl Vargas 07-15-1935 [id number]  RN spoke with pt today and verified identifiers. RN introduced [facility] on behalf of [facility] and the purpose for today's call. Pt receptive and a screening was completed with no needed identified. Pt reports she is managing her HF with daily weights and no encountered issues.   Raina Mina, RN Care Management Coordinator [place] Office [phone]

## 2017-05-09 DIAGNOSIS — H43813 Vitreous degeneration, bilateral: Secondary | ICD-10-CM | POA: Diagnosis not present

## 2017-05-09 DIAGNOSIS — H25813 Combined forms of age-related cataract, bilateral: Secondary | ICD-10-CM | POA: Diagnosis not present

## 2017-05-09 DIAGNOSIS — H35371 Puckering of macula, right eye: Secondary | ICD-10-CM | POA: Diagnosis not present

## 2017-05-09 DIAGNOSIS — H524 Presbyopia: Secondary | ICD-10-CM | POA: Diagnosis not present

## 2017-05-16 DIAGNOSIS — D0461 Carcinoma in situ of skin of right upper limb, including shoulder: Secondary | ICD-10-CM | POA: Diagnosis not present

## 2017-05-16 DIAGNOSIS — L821 Other seborrheic keratosis: Secondary | ICD-10-CM | POA: Diagnosis not present

## 2017-05-16 DIAGNOSIS — D1801 Hemangioma of skin and subcutaneous tissue: Secondary | ICD-10-CM | POA: Diagnosis not present

## 2017-06-26 ENCOUNTER — Other Ambulatory Visit: Payer: Self-pay

## 2017-06-26 ENCOUNTER — Encounter (HOSPITAL_COMMUNITY): Payer: Self-pay

## 2017-06-26 ENCOUNTER — Telehealth (HOSPITAL_COMMUNITY): Payer: Self-pay | Admitting: *Deleted

## 2017-06-26 ENCOUNTER — Other Ambulatory Visit (HOSPITAL_COMMUNITY): Payer: Self-pay

## 2017-06-26 ENCOUNTER — Emergency Department (HOSPITAL_COMMUNITY)
Admission: EM | Admit: 2017-06-26 | Discharge: 2017-06-26 | Disposition: A | Discharge status: Home / Self Care | Payer: PPO | Attending: Emergency Medicine | Admitting: Emergency Medicine

## 2017-06-26 DIAGNOSIS — I4891 Unspecified atrial fibrillation: Secondary | ICD-10-CM | POA: Diagnosis not present

## 2017-06-26 DIAGNOSIS — I110 Hypertensive heart disease with heart failure: Secondary | ICD-10-CM | POA: Insufficient documentation

## 2017-06-26 DIAGNOSIS — R000 Tachycardia, unspecified: Secondary | ICD-10-CM | POA: Diagnosis not present

## 2017-06-26 DIAGNOSIS — R002 Palpitations: Secondary | ICD-10-CM | POA: Diagnosis present

## 2017-06-26 DIAGNOSIS — I5042 Chronic combined systolic (congestive) and diastolic (congestive) heart failure: Secondary | ICD-10-CM | POA: Diagnosis not present

## 2017-06-26 DIAGNOSIS — I480 Paroxysmal atrial fibrillation: Secondary | ICD-10-CM

## 2017-06-26 DIAGNOSIS — I5022 Chronic systolic (congestive) heart failure: Secondary | ICD-10-CM

## 2017-06-26 DIAGNOSIS — Z96642 Presence of left artificial hip joint: Secondary | ICD-10-CM | POA: Insufficient documentation

## 2017-06-26 DIAGNOSIS — Z79899 Other long term (current) drug therapy: Secondary | ICD-10-CM | POA: Insufficient documentation

## 2017-06-26 LAB — BASIC METABOLIC PANEL
Anion gap: 9 (ref 5–15)
BUN: 19 mg/dL (ref 6–20)
CO2: 22 mmol/L (ref 22–32)
Calcium: 8.8 mg/dL — ABNORMAL LOW (ref 8.9–10.3)
Chloride: 105 mmol/L (ref 101–111)
Creatinine, Ser: 0.94 mg/dL (ref 0.44–1.00)
GFR calc Af Amer: >60 mL/min (ref >60)
GFR calc non Af Amer: 56 mL/min — ABNORMAL LOW (ref >60)
Glucose, Bld: 119 mg/dL — ABNORMAL HIGH (ref 65–99)
Potassium: 4.2 mmol/L (ref 3.5–5.1)
Sodium: 136 mmol/L (ref 135–145)

## 2017-06-26 LAB — CBC WITH DIFFERENTIAL/PLATELET
Basophils Absolute: 0.1 10*3/uL (ref 0.0–0.1)
Basophils Relative: 1 %
Eosinophils Absolute: 0.1 10*3/uL (ref 0.0–0.7)
Eosinophils Relative: 1 %
HCT: 43.6 % (ref 36.0–46.0)
Hemoglobin: 14.9 g/dL (ref 12.0–15.0)
Lymphocytes Relative: 25 %
Lymphs Abs: 1.6 10*3/uL (ref 0.7–4.0)
MCH: 30.8 pg (ref 26.0–34.0)
MCHC: 34.2 g/dL (ref 30.0–36.0)
MCV: 90.1 fL (ref 78.0–100.0)
Monocytes Absolute: 0.5 10*3/uL (ref 0.1–1.0)
Monocytes Relative: 8 %
Neutro Abs: 4.4 10*3/uL (ref 1.7–7.7)
Neutrophils Relative %: 65 %
Platelets: 241 10*3/uL (ref 150–400)
RBC: 4.84 MIL/uL (ref 3.87–5.11)
RDW: 13 % (ref 11.5–15.5)
WBC: 6.7 10*3/uL (ref 4.0–10.5)

## 2017-06-26 LAB — MAGNESIUM: Magnesium: 1.9 mg/dL (ref 1.7–2.4)

## 2017-06-26 MED ORDER — DILTIAZEM LOAD VIA INFUSION
15.0000 mg | Freq: Once | INTRAVENOUS | Status: AC
  Administered 2017-06-26: 15 mg via INTRAVENOUS
  Filled 2017-06-26: qty 15

## 2017-06-26 MED ORDER — DILTIAZEM HCL 100 MG IV SOLR
5.0000 mg/h | INTRAVENOUS | Status: DC
  Filled 2017-06-26: qty 100

## 2017-06-26 MED ORDER — SODIUM CHLORIDE 0.9 % IV BOLUS (SEPSIS)
250.0000 mL | Freq: Once | INTRAVENOUS | Status: AC
  Administered 2017-06-26: 250 mL via INTRAVENOUS

## 2017-06-26 MED ORDER — BISOPROLOL FUMARATE 5 MG PO TABS
5.0000 mg | ORAL_TABLET | Freq: Every day | ORAL | Status: DC
  Administered 2017-06-26: 5 mg via ORAL
  Filled 2017-06-26 (×2): qty 1

## 2017-06-26 NOTE — ED Notes (Signed)
Cardiology at bedside.

## 2017-06-26 NOTE — ED Triage Notes (Signed)
Patient complains of palpitations since Saturday. States that I am back in atrial fib, weakness. No SOB, no CP

## 2017-06-26 NOTE — Consult Note (Signed)
Advanced Heart Failure Team Consult Note   Primary Physician: Primary Cardiologist:  Dr Aundra Dubin EP: Dr Rayann Heman    Reason for Consultation: A fib RVR /Heart Failure   HPI:    Cheryl Vargas is seen today for evaluation of A fib RVR/heart failure at the request of Dr Jeneen Rinks.  Cheryl Vargas is a 82 year old with a history of PAF, chronic systolic heart failure, PVCs, hyperlipidemia.   Followed by Dr Aundra Dubin and Dr Rayann Heman in the community. She has been evaluated by Dr Rayann Heman for an ablation but she wanted to hold off. In the past she has been intolerant amio due to nausea and not a candidate for sotalol or tikosyn due to prolonged qTc. In June she saw Dr Aundra Dubin and she was in NSR.   Over the last 6 weeks she cut back xarelto to every other day and then on Saturday she had palpitations so she started taking xarelto daily. Over the weekend heart rate was 140-200s. Yesterday she increased bisoprolol to 5 mg daily.  Today she presented to [facility] with palpitations. EKG confirmed A fib RVR 140 bpm.  Started on diltiazem with rate down 110-120s. Overall she feels ok but does admit to mild fatigue. Denies SOB. Pertinent labs include: K 4.2, creatinine 0.94, hgb 14.9, and platelets 241.   Review of Systems: [y] = yes, [ ]  = no   General: Weight gain [ ] ; Weight loss [ ] ; Anorexia [ ] ; Fatigue [Y ]; Fever [ ] ; Chills [ ] ; Weakness [ ]   Cardiac: Chest pain/pressure [ ] ; Resting SOB [ ] ; Exertional SOB [ ] ; Orthopnea [ ] ; Pedal Edema [ ] ; Palpitations [Y ]; Syncope [ ] ; Presyncope [ ] ; Paroxysmal nocturnal dyspnea[ ]   Pulmonary: Cough [ ] ; Wheezing[ ] ; Hemoptysis[ ] ; Sputum [ ] ; Snoring [ ]   GI: Vomiting[ ] ; Dysphagia[ ] ; Melena[ ] ; Hematochezia [ ] ; Heartburn[ ] ; Abdominal pain [ ] ; Constipation [ ] ; Diarrhea [ ] ; BRBPR [ ]   GU: Hematuria[ ] ; Dysuria [ ] ; Nocturia[ ]   Vascular: Pain in legs with walking [ ] ; Pain in feet with lying flat [ ] ; Non-healing sores [ ] ; Stroke [ ] ; TIA [ ] ; Slurred speech [ ] ;    Neuro: Headaches[ ] ; Vertigo[ ] ; Seizures[ ] ; Paresthesias[ ] ;Blurred vision [ ] ; Diplopia [ ] ; Vision changes [ ]   Ortho/Skin: Arthritis [ ] ; Joint pain [Y ]; Muscle pain [ ] ; Joint swelling [ ] ; Back Pain [ ] ; Rash [ ]   Psych: Depression[ ] ; Anxiety[Y]  Heme: Bleeding problems [ ] ; Clotting disorders [ ] ; Anemia [ ]   Endocrine: Diabetes [ ] ; Thyroid dysfunction[ ]   Home Medications Prior to Admission medications   Medication Sig Start Date End Date Taking? Authorizing Provider  acetaminophen (TYLENOL) 500 MG tablet Take 500 mg by mouth every 6 (six) hours as needed for moderate pain.   Yes [provider]  bisoprolol (ZEBETA) 5 MG tablet Take 0.5 tablets (2.5 mg total) by mouth at bedtime. 09/29/16  Yes Larey Dresser, MD  fluticasone [facility]) 50 MCG/ACT nasal spray Place 1 spray into both nostrils daily as needed for allergies or rhinitis.   Yes [provider]  furosemide (LASIX) 20 MG tablet Take 1 tablet (20 mg total) by mouth as needed for fluid or edema. 03/05/17  Yes Larey Dresser, MD  losartan (COZAAR) 25 MG tablet Take 0.5 tablets (12.5 mg total) by mouth at bedtime. 03/05/17  Yes Larey Dresser, MD  naphazoline-pheniramine (NAPHCON-A) 0.025-0.3 %  ophthalmic solution Place 1 drop into both eyes daily as needed. For dry eyes   Yes [provider]  Rivaroxaban (XARELTO) 15 MG TABS tablet Take 1 tablet (15 mg total) by mouth daily with supper. 09/27/16  Yes Larey Dresser, MD  zolpidem (AMBIEN) 5 MG tablet Take 5 mg by mouth at bedtime.    Yes [provider]    Past Medical History: Past Medical History:  Diagnosis Date  . Arthritis   . Chronic combined systolic (congestive) and diastolic (congestive) heart failure ([place])   . Dysrhythmia    pvc's per pt  . Enlarged heart   . Paroxysmal A-fib ([place])   . PONV (postoperative nausea and vomiting)     Past Surgical History: Past Surgical History:  Procedure Laterality Date  .  BUNIONECTOMY    . CARDIAC CATHETERIZATION     in 2004 at [facility]. "Insignificant blockage" per patient  . CARDIAC CATHETERIZATION N/A 04/25/2016   Procedure: Left Heart Cath and Coronary Angiography;  Surgeon: Nelva Bush, MD;  Location: [place] [facility];  Service: Cardiovascular;  Laterality: N/A;  . SHOULDER SURGERY     closed reduction  . TONSILLECTOMY    . TOTAL HIP ARTHROPLASTY Left 03/18/2013   Procedure: TOTAL HIP ARTHROPLASTY ANTERIOR APPROACH;  Surgeon: Mauri Pole, MD;  Location: [facility];  Service: Orthopedics;  Laterality: Left;  . TUBAL LIGATION      Family History: Family History  Problem Relation Age of Onset  . Lung cancer Mother   . Heart attack Father     Social History: Social History   Social History  . Marital status: Widowed    Spouse name: N/A  . Number of children: N/A  . Years of education: N/A   Social History Main Topics  . Smoking status: Never Smoker  . Smokeless tobacco: Never Used  . Alcohol use 1.8 oz/week    3 Glasses of wine per week     Comment: per week  . Drug use: No  . Sexual activity: Not Asked   Other Topics Concern  . None   Social History Narrative  . None    Allergies:  Allergies  Allergen Reactions  . Alendronate Sodium Other (See Comments)    Arm pain  . Amiodarone Nausea Only  . Ciprofloxacin     Cipro= not effective  . Coreg [Carvedilol]     Fatigue   . Metoprolol     Profound fatigue    Objective:    Vital Signs:   Temp:  [97.6 F (36.4 C)] 97.6 F (36.4 C) (10/16 0919) Pulse Rate:  [74-141] 106 (10/16 1307) Resp:  [13-18] 13 (10/16 1307) BP: (91-124)/(58-94) 124/69 (10/16 1307) SpO2:  [96 %-99 %] 99 % (10/16 1307)    Weight change: There were no vitals filed for this visit.  Intake/Output:   Intake/Output Summary (Last 24 hours) at 06/26/17 1347 Last data filed at 06/26/17 1121  Gross per 24 hour  Intake              250 ml  Output                0 ml  Net              250 ml       Physical Exam    General:  Well appearing. No resp difficulty HEENT: normal Neck: supple. JVP 8-9 Carotids 2+ bilat; no bruits. No lymphadenopathy or thyromegaly appreciated. Cor: PMI nondisplaced. Irregular rate &  rhythm. Tachy. No rubs, gallops or murmurs. Lungs: clear Abdomen: soft, nontender, + distended. No hepatosplenomegaly. No bruits or masses. Good bowel sounds. Extremities: no cyanosis, clubbing, rash, edema Neuro: alert & orientedx3, cranial nerves grossly intact. moves all 4 extremities w/o difficulty. Affect pleasant   Telemetry   A fib 113 bpm personally reviewed   EKG   A fib RVR 144 bpm personally reviewed.   Labs   Basic Metabolic Panel:  Recent Labs Lab 06/26/17 0921  NA 136  K 4.2  CL 105  CO2 22  GLUCOSE 119*  BUN 19  CREATININE 0.94  CALCIUM 8.8*  MG 1.9    Liver Function Tests: No results for input(s): AST, ALT, ALKPHOS, BILITOT, PROT, ALBUMIN in the last 168 hours. No results for input(s): LIPASE, AMYLASE in the last 168 hours. No results for input(s): AMMONIA in the last 168 hours.  CBC:  Recent Labs Lab 06/26/17 0921  WBC 6.7  NEUTROABS 4.4  HGB 14.9  HCT 43.6  MCV 90.1  PLT 241    Cardiac Enzymes: No results for input(s): CKTOTAL, CKMB, CKMBINDEX, TROPONINI in the last 168 hours.  BNP: BNP (last 3 results) No results for input(s): BNP in the last 8760 hours.  ProBNP (last 3 results) No results for input(s): PROBNP in the last 8760 hours.   CBG: No results for input(s): GLUCAP in the last 168 hours.  Coagulation Studies: No results for input(s): LABPROT, INR in the last 72 hours.   Imaging    No results found.   Medications:     Current Medications:   Infusions: . diltiazem (CARDIZEM) infusion Stopped (06/26/17 1036)       Patient Profile   Cheryl Dejager is a 82 year old with a history of PAF, chronic systolic heart failure, PVCs, hyperlipidemia evaluated in the ED for A fib RVR.    Assessment/Plan   1. A fib RVR- Started on diltiazem but later stopped due soft SBP. - She is not symptomatic.  - Intolerant amio due to nausea. Not a candidate for sotalol tikosyn due to prolonged qtc.   - Considered DC-CV however she has not been taking xarelto regularly so this was not an option.  -Plan to increase bisoprolol 5 mg daily for rate control.   -Discussed with EP. Follow up in A Fib clinic on Thursday and set up TEE-DC-CV on Friday. She will then follow up with Dr Rayann Heman for ablation in the fa few weeks.  -Ok to go home after EP evaluates.  2. Chronic Systolic Heart Failure-CMRI 06/2016 36% Volume status mildly elevated. Continue lasix as needed and she understands she may need to take lasix for the next few days.    Follow up in the HF clinic 07/10/2017 at 11:20  Follow up A fib 06/28/2017 at 11:30    Length of Stay: 0  [name] Clegg, NP  06/26/2017, 1:47 PM  Advanced Heart Failure Team Pager [phone] (M-F; [employer])  Please contact [facility] for night-coverage after hours (4p -7a ) and weekends on [email or web address]  She has PAF with RVR. Only mildly symptomatic. Unfortunately she has not been completely compliant with her Xarelto so cannot perform DC-CV in ER. Will increase bisoprolol for rate control. Has not tolerated amio or other AAs (h/o prolonged QT). Discussed with EP. They will see her back in AF Clinic on Thursday. We will go ahead and arrange TEE/DC-CV with Dr. Aundra Dubin on Friday. Will take one dose of lasix for mild edema.   Aya Geisel,  Quillian Quince, MD  3:44 PM

## 2017-06-26 NOTE — Telephone Encounter (Signed)
Advanced Heart Failure Triage Encounter  Patient Name: Cheryl Vargas  Date of Call: 06/26/17  Problem: HR 140's-160's  Patient called stating she thinks she is back in Afib and her HR is 144-168.  This started saturday evening when she had a lot of family over during the power outage.  She said she is feeling fatigued and feels her HR racing. Her home BP machine is not reading her BP but she went to [facility] yesterday and it was 115/72. She is asking to be seen.   Plan:  I spoke with Jettie Booze, NP who reviewed patient's history and since she is symptomatic she advises patient to go to the ER.  Patient is aware and agreeable to go to the ER.   Darron Doom, RN

## 2017-06-26 NOTE — ED Provider Notes (Signed)
[facility] EMERGENCY DEPARTMENT Provider Note   CSN: [id number] Arrival date & time: 06/26/17  2694     History   Chief Complaint No chief complaint on file.   HPI Cheryl Vargas is a 82 y.o. female. Chief complaint is "I'm in atrial fibrillation again".  HPI:  82 year old female. Follows with Dr. [name] of CHF cardiology cardiology. History of atrial fibrillation. Is on rate control with bisoprolol, and anticoagulated with xarelto. States she felt Saturday that she was in atrial fibrillation. Was weak and fatigued and had rapid heart rate. Today's Tuesday. Her symptoms have persisted she continues to feel fatigued. She called the office and was directed here.  CHADSVASC score of 5.  No chest pain. No shortness of breath. No edema.  Past Medical History:  Diagnosis Date  . Arthritis   . Chronic combined systolic (congestive) and diastolic (congestive) heart failure ([place])   . Dysrhythmia    pvc's per pt  . Enlarged heart   . Paroxysmal A-fib ([place])   . PONV (postoperative nausea and vomiting)     Patient Active Problem List   Diagnosis Date Noted  . CHF (congestive heart failure), NYHA class IV ([place]) 04/24/2016  . Atrial fibrillation ([place]) 12/01/2015  . Dizziness 12/01/2015  . Hypertension 12/01/2015  . Insomnia 12/01/2015  . Acute on chronic combined systolic (congestive) and diastolic (congestive) heart failure ([place]) 12/01/2015  . Closed left hip fracture ([place]) 03/17/2013  . Cardiomegaly 03/17/2013  . PVC (premature ventricular contraction) 03/17/2013  . Hyperlipidemia 03/17/2013    Past Surgical History:  Procedure Laterality Date  . BUNIONECTOMY    . CARDIAC CATHETERIZATION     in 2004 at [facility]. "Insignificant blockage" per patient  . CARDIAC CATHETERIZATION N/A 04/25/2016   Procedure: Left Heart Cath and Coronary Angiography;  Surgeon: Nelva Bush, MD;  Location: [facility] CV [facility];  Service: Cardiovascular;  Laterality: N/A;   . SHOULDER SURGERY     closed reduction  . TONSILLECTOMY    . TOTAL HIP ARTHROPLASTY Left 03/18/2013   Procedure: TOTAL HIP ARTHROPLASTY ANTERIOR APPROACH;  Surgeon: Mauri Pole, MD;  Location: [facility];  Service: Orthopedics;  Laterality: Left;  . TUBAL LIGATION      OB History    No data available       Home Medications    Prior to Admission medications   Medication Sig Start Date End Date Taking? Authorizing Provider  acetaminophen (TYLENOL) 500 MG tablet Take 500 mg by mouth every 6 (six) hours as needed for moderate pain.   Yes [provider]  bisoprolol (ZEBETA) 5 MG tablet Take 0.5 tablets (2.5 mg total) by mouth at bedtime. 09/29/16  Yes Larey Dresser, MD  fluticasone [facility]) 50 MCG/ACT nasal spray Place 1 spray into both nostrils daily as needed for allergies or rhinitis.   Yes [provider]  furosemide (LASIX) 20 MG tablet Take 1 tablet (20 mg total) by mouth as needed for fluid or edema. 03/05/17  Yes Larey Dresser, MD  losartan (COZAAR) 25 MG tablet Take 0.5 tablets (12.5 mg total) by mouth at bedtime. 03/05/17  Yes Larey Dresser, MD  naphazoline-pheniramine (NAPHCON-A) 0.025-0.3 % ophthalmic solution Place 1 drop into both eyes daily as needed. For dry eyes   Yes [provider]  Rivaroxaban (XARELTO) 15 MG TABS tablet Take 1 tablet (15 mg total) by mouth daily with supper. 09/27/16  Yes Larey Dresser, MD  zolpidem (AMBIEN) 5 MG tablet Take 5  mg by mouth at bedtime.    Yes [provider]    Family History Family History  Problem Relation Age of Onset  . Lung cancer Mother   . Heart attack Father     Social History Social History  Substance Use Topics  . Smoking status: Never Smoker  . Smokeless tobacco: Never Used  . Alcohol use 1.8 oz/week    3 Glasses of wine per week     Comment: per week     Allergies   Alendronate sodium; Amiodarone; Ciprofloxacin; Coreg [carvedilol]; and Metoprolol   Review of  Systems Review of Systems  Constitutional: Positive for fatigue. Negative for appetite change, chills, diaphoresis and fever.  HENT: Negative for mouth sores, sore throat and trouble swallowing.   Eyes: Negative for visual disturbance.  Respiratory: Negative for cough, chest tightness, shortness of breath and wheezing.   Cardiovascular: Positive for palpitations. Negative for chest pain.  Gastrointestinal: Negative for abdominal distention, abdominal pain, diarrhea, nausea and vomiting.  Endocrine: Negative for polydipsia, polyphagia and polyuria.  Genitourinary: Negative for dysuria, frequency and hematuria.  Musculoskeletal: Negative for gait problem.  Skin: Negative for color change, pallor and rash.  Neurological: Positive for weakness. Negative for dizziness, syncope, light-headedness and headaches.  Hematological: Does not bruise/bleed easily.  Psychiatric/Behavioral: Negative for behavioral problems and confusion.     Physical Exam Updated Vital Signs BP 124/69   Pulse (!) 106   Temp 97.6 F (36.4 C) (Oral)   Resp 13   SpO2 99%   Physical Exam  Constitutional: She is oriented to person, place, and time. She appears well-developed and well-nourished. No distress.  HENT:  Head: Normocephalic.  Eyes: Pupils are equal, round, and reactive to light. Conjunctivae are normal. No scleral icterus.  Neck: Normal range of motion. Neck supple. No thyromegaly present.  Cardiovascular: Exam reveals no gallop and no friction rub.   No murmur heard. Atrial fibrillation with irregularly irregular pulse. A. Fib with RVR on the monitor. Narrow.  Pulmonary/Chest: Effort normal and breath sounds normal. No respiratory distress. She has no wheezes. She has no rales.  Clear lungs.  Abdominal: Soft. Bowel sounds are normal. She exhibits no distension. There is no tenderness. There is no rebound.  Musculoskeletal: Normal range of motion.  Neurological: She is alert and oriented to person,  place, and time.  Skin: Skin is warm and dry. No rash noted.  Psychiatric: She has a normal mood and affect. Her behavior is normal.     ED Treatments / Results  Labs (all labs ordered are listed, but only abnormal results are displayed) Labs Reviewed  BASIC METABOLIC PANEL - Abnormal; Notable for the following:       Result Value   Glucose, Bld 119 (*)    Calcium 8.8 (*)    GFR calc non Af Amer 56 (*)    All other components within normal limits  CBC WITH DIFFERENTIAL/PLATELET  MAGNESIUM    EKG  EKG Interpretation  Date/Time:  Tuesday June 26 2017 09:20:50 EDT Ventricular Rate:  144 PR Interval:    QRS Duration: 132 QT Interval:  328 QTC Calculation: 507 R Axis:   40 Text Interpretation:  Atrial fibrillation with rapid ventricular response Non-specific intra-ventricular conduction block T wave abnormality, consider inferior ischemia Abnormal ECG Confirmed by Tanna Furry [phone]) on 06/26/2017 9:45:30 AM       Radiology No results found.  Procedures Procedures (including critical care time)  Medications Ordered in ED Medications  diltiazem (CARDIZEM) 1  mg/mL load via infusion 15 mg (15 mg Intravenous Bolus from Bag 06/26/17 1019)    And  diltiazem (CARDIZEM) 100 mg in dextrose 5 % 100 mL (1 mg/mL) infusion (0 mg/hr Intravenous Stopped 06/26/17 1036)  sodium chloride 0.9 % bolus 250 mL (0 mLs Intravenous Stopped 06/26/17 1121)     Initial Impression / Assessment and Plan / ED Course  I have reviewed the triage vital signs and the nursing notes.  Pertinent labs & imaging results that were available during my care of the patient were reviewed by me and considered in my medical decision making (see chart for details).    Patient given Cardizem bolus. Started on infusion. Rate dropped to the 70s. She later fibrillation. BP 95. Was given 250 fluid bolus, and infusion discontinued. Maintains rate control in the 80s. Blood pressure is 110. Perfusing and oxygenating  well. Cardiology consult regarding disposition.  CRITICAL CARE Performed by: Tanna Furry JOSEPH   Total critical care time: 30 minutes  Critical care time was exclusive of separately billable procedures and treating other patients.  Critical care was necessary to treat or prevent imminent or life-threatening deterioration.  Critical care was time spent personally by me on the following activities: development of treatment plan with patient and/or surrogate as well as nursing, discussions with consultants, evaluation of patient's response to treatment, examination of patient, obtaining history from patient or surrogate, ordering and performing treatments and interventions, ordering and review of laboratory studies, ordering and review of radiographic studies, pulse oximetry and re-evaluation of patient's condition.   Final Clinical Impressions(s) / ED Diagnoses   Final diagnoses:  Atrial fibrillation with rapid ventricular response [facility])    New Prescriptions New Prescriptions   No medications on file     Tanna Furry, MD 06/26/17 1351

## 2017-06-27 ENCOUNTER — Telehealth (HOSPITAL_COMMUNITY): Payer: Self-pay | Admitting: *Deleted

## 2017-06-27 NOTE — Telephone Encounter (Signed)
Pt called to report she is feeling a little worse than yesterday.  She states she took Bisoprolol when she got home from ER yesterday and took some more at bedtime last night.  She states HR is still staying around 100-120 but BP was low at 77/63 earlier and she states she is weak and does not have much energy.  Discussed w/Dr Aundra Dubin he advised pt only take 2.5 mg of Bisoprolol tonight and keep appt w/Donna Kayleen Memos, NP in a-fib clinic tomorrow.  Pt aware and agreeable, she states if she gets to feeling worse she will come back to ER.

## 2017-06-28 ENCOUNTER — Other Ambulatory Visit: Payer: Self-pay

## 2017-06-28 ENCOUNTER — Encounter (HOSPITAL_COMMUNITY): Payer: Self-pay | Admitting: Nurse Practitioner

## 2017-06-28 ENCOUNTER — Ambulatory Visit (HOSPITAL_COMMUNITY)
Admit: 2017-06-28 | Discharge: 2017-06-28 | Disposition: A | Discharge status: Home / Self Care | Payer: PPO | Attending: Nurse Practitioner | Admitting: Nurse Practitioner

## 2017-06-28 ENCOUNTER — Encounter (HOSPITAL_COMMUNITY): Payer: Self-pay | Admitting: Anesthesiology

## 2017-06-28 VITALS — BP 98/60 | HR 125 | Ht 64.0 in | Wt 144.0 lb

## 2017-06-28 DIAGNOSIS — Z79899 Other long term (current) drug therapy: Secondary | ICD-10-CM | POA: Diagnosis not present

## 2017-06-28 DIAGNOSIS — I481 Persistent atrial fibrillation: Secondary | ICD-10-CM | POA: Insufficient documentation

## 2017-06-28 DIAGNOSIS — I4819 Other persistent atrial fibrillation: Secondary | ICD-10-CM

## 2017-06-28 DIAGNOSIS — Z801 Family history of malignant neoplasm of trachea, bronchus and lung: Secondary | ICD-10-CM | POA: Diagnosis not present

## 2017-06-28 DIAGNOSIS — I5042 Chronic combined systolic (congestive) and diastolic (congestive) heart failure: Secondary | ICD-10-CM | POA: Insufficient documentation

## 2017-06-28 DIAGNOSIS — Z888 Allergy status to other drugs, medicaments and biological substances status: Secondary | ICD-10-CM | POA: Diagnosis not present

## 2017-06-28 DIAGNOSIS — Z96642 Presence of left artificial hip joint: Secondary | ICD-10-CM | POA: Insufficient documentation

## 2017-06-28 DIAGNOSIS — I480 Paroxysmal atrial fibrillation: Secondary | ICD-10-CM | POA: Diagnosis not present

## 2017-06-28 DIAGNOSIS — Z7901 Long term (current) use of anticoagulants: Secondary | ICD-10-CM | POA: Diagnosis not present

## 2017-06-28 DIAGNOSIS — Z9889 Other specified postprocedural states: Secondary | ICD-10-CM | POA: Insufficient documentation

## 2017-06-28 DIAGNOSIS — Z8249 Family history of ischemic heart disease and other diseases of the circulatory system: Secondary | ICD-10-CM | POA: Insufficient documentation

## 2017-06-28 NOTE — Patient Instructions (Addendum)
Cardioversion scheduled for Friday, October 19th  - Arrive at the [facility] and go to admitting at 7:30AM  -Do not eat or drink anything after midnight the night prior to your procedure.  -Do not miss any doses of your Xarelto.  - Take all your medication with a sip of water prior to arrival.  - You will not be able to drive home after your procedure.

## 2017-06-28 NOTE — Anesthesia Preprocedure Evaluation (Signed)
Anesthesia Evaluation  Patient identified by MRN, date of birth, ID band Patient awake    Reviewed: Allergy & Precautions, H&P , NPO status , Patient's Chart, lab work & pertinent test results  History of Anesthesia Complications (+) PONV and history of anesthetic complications  Airway Mallampati: II  TM Distance: >3 FB Neck ROM: Full    Dental no notable dental hx.    Pulmonary neg pulmonary ROS,    Pulmonary exam normal breath sounds clear to auscultation       Cardiovascular hypertension, Pt. on medications +CHF  + dysrhythmias  Rhythm:Regular Rate:Normal + Systolic murmurs - Left ventricle: The cavity size was normal. Systolic   function was moderately to severely reduced. The estimated   ejection fraction was in the range of 30% to 35%. Wall   motion was normal; there were no regional wall motion   abnormalities. There was an increased relative   contribution of atrial contraction to ventricular filling.   Doppler parameters are consistent with abnormal left   ventricular relaxation (grade 1 diastolic dysfunction   Neuro/Psych negative neurological ROS  negative psych ROS   GI/Hepatic negative GI ROS, Neg liver ROS,   Endo/Other  negative endocrine ROS  Renal/GU negative Renal ROS  negative genitourinary   Musculoskeletal negative musculoskeletal ROS (+) Arthritis , Osteoarthritis,    Abdominal   Peds negative pediatric ROS (+)  Hematology negative hematology ROS (+)   Anesthesia Other Findings Echo 8/17 1.  No angiographically significant coronary artery disease. 2.  Normal left ventricular filling pressure.  Reproductive/Obstetrics negative OB ROS                             Anesthesia Physical  Anesthesia Plan  ASA: III  Anesthesia Plan: General and MAC   Post-op Pain Management:    Induction: Intravenous  PONV Risk Score and Plan: 2 and Ondansetron, Dexamethasone,  Treatment may vary due to age or medical condition and Midazolam  Airway Management Planned: Mask and Nasal Cannula  Additional Equipment:   Intra-op Plan:   Post-operative Plan: Extubation in OR  Informed Consent: I have reviewed the patients History and Physical, chart, labs and discussed the procedure including the risks, benefits and alternatives for the proposed anesthesia with the patient or authorized representative who has indicated his/her understanding and acceptance.   Dental advisory given  Plan Discussed with: CRNA and Surgeon  Anesthesia Plan Comments:         Anesthesia Quick Evaluation

## 2017-06-28 NOTE — Progress Notes (Signed)
Primary Care Physician: Lavone Orn, MD Referring Physician: [facility] ER f/u Cardiologist: Dr. Aundra Dubin EP: Dr. Manus Gunning P Cheryl Vargas is a 82 y.o. female with a h/o paroxysmal afib that presented in afib for several days to the [facility] ER 10/16 . She was fatigued . Chadsvasc score is 5 and pt is on xarelto but had been taking inconsistently, so she was scheduled tomorrow for outpt TEE guided cardioversion with Dr. Aundra Dubin..She has now been taking xarelto  on a daily basis since Saturday. She was started on IV Cardizem in ER but stopped 2/2 soft BP.  She has chronic systolic heart failure followed by Dr. Aundra Dubin. She has felt the need to take lasix for the last two days but her weight is stable. V rate is 125 bpm. She has tried amiodarone in the past but had nausea. She is not a candidate for other antiarrythmic's due to h/o long qt. Has been evaluated for ablation by Dr. Rayann Heman, but pt was not ready, left atrium size thought to possibly diminish chances of success.   She is in the afib clinic today for f/u of ER and discuss procedure of cardioversion as pt has not had this done in  the past.  Today, she denies symptoms of palpitations, chest pain, shortness of breath, orthopnea, PND, lower extremity edema, dizziness, presyncope, syncope, or neurologic sequela. The patient is tolerating medications without difficulties and is otherwise without complaint today.   Past Medical History:  Diagnosis Date  . Arthritis   . Chronic combined systolic (congestive) and diastolic (congestive) heart failure ([place])   . Dysrhythmia    pvc's per pt  . Enlarged heart   . Paroxysmal A-fib ([place])   . PONV (postoperative nausea and vomiting)    Past Surgical History:  Procedure Laterality Date  . BUNIONECTOMY    . CARDIAC CATHETERIZATION     in 2004 at [facility]. "Insignificant blockage" per patient  . CARDIAC CATHETERIZATION N/A 04/25/2016   Procedure: Left Heart Cath and Coronary Angiography;  Surgeon:  Nelva Bush, MD;  Location: [place] [facility] CV LAB;  Service: Cardiovascular;  Laterality: N/A;  . SHOULDER SURGERY     closed reduction  . TONSILLECTOMY    . TOTAL HIP ARTHROPLASTY Left 03/18/2013   Procedure: TOTAL HIP ARTHROPLASTY ANTERIOR APPROACH;  Surgeon: Mauri Pole, MD;  Location: [facility];  Service: Orthopedics;  Laterality: Left;  . TUBAL LIGATION      Current Outpatient Prescriptions  Medication Sig Dispense Refill  . acetaminophen (TYLENOL) 500 MG tablet Take 500 mg by mouth every 6 (six) hours as needed for moderate pain.    . bisoprolol (ZEBETA) 5 MG tablet Take 0.5 tablets (2.5 mg total) by mouth at bedtime. 30 tablet 11  . fluticasone (FLONASE) 50 MCG/ACT nasal spray Place 1 spray into both nostrils daily as needed for allergies or rhinitis.    . furosemide (LASIX) 20 MG tablet Take 1 tablet (20 mg total) by mouth as needed for fluid or edema. 15 tablet 3  . losartan (COZAAR) 25 MG tablet Take 0.5 tablets (12.5 mg total) by mouth at bedtime. 45 tablet 3  . naphazoline-pheniramine (NAPHCON-A) 0.025-0.3 % ophthalmic solution Place 1 drop into both eyes daily as needed. For dry eyes    . Rivaroxaban (XARELTO) 15 MG TABS tablet Take 1 tablet (15 mg total) by mouth daily with supper. 30 tablet 11  . zolpidem (AMBIEN) 5 MG tablet Take 5 mg by mouth at bedtime.      No  current facility-administered medications for this encounter.     Allergies  Allergen Reactions  . Alendronate Sodium Other (See Comments)    Arm pain  . Amiodarone Nausea Only  . Ciprofloxacin     Cipro= not effective  . Coreg [Carvedilol]     Fatigue   . Metoprolol     Profound fatigue    Social History   Social History  . Marital status: Widowed    Spouse name: N/A  . Number of children: N/A  . Years of education: N/A   Occupational History  . Not on file.   Social History Main Topics  . Smoking status: Never Smoker  . Smokeless tobacco: Never Used  . Alcohol use 1.8 oz/week    3 Glasses  of wine per week     Comment: per week  . Drug use: No  . Sexual activity: Not on file   Other Topics Concern  . Not on file   Social History Narrative  . No narrative on file    Family History  Problem Relation Age of Onset  . Lung cancer Mother   . Heart attack Father     ROS- All systems are reviewed and negative except as per the HPI above  Physical Exam: Vitals:   06/28/17 1128  BP: 98/60  Pulse: (!) 125  Weight: 144 lb (65.3 kg)  Height: 5\' 4"  (1.626 m)   Wt Readings from Last 3 Encounters:  06/28/17 144 lb (65.3 kg)  03/05/17 143 lb (64.9 kg)  11/13/16 144 lb 8 oz (65.5 kg)    Labs: Lab Results  Component Value Date   NA 136 06/26/2017   K 4.2 06/26/2017   CL 105 06/26/2017   CO2 22 06/26/2017   GLUCOSE 119 (H) 06/26/2017   BUN 19 06/26/2017   CREATININE 0.94 06/26/2017   CALCIUM 8.8 (L) 06/26/2017   MG 1.9 06/26/2017   Lab Results  Component Value Date   INR 1.25 04/24/2016   No results found for: CHOL, HDL, LDLCALC, TRIG   GEN- The patient is well appearing, alert and oriented x 3 today.   Head- normocephalic, atraumatic Eyes-  Sclera clear, conjunctiva pink Ears- hearing intact Oropharynx- clear Neck- supple, no JVP Lymph- no cervical lymphadenopathy Lungs- Clear to ausculation bilaterally, normal work of breathing Heart- irregular rate and rhythm, no murmurs, rubs or gallops, PMI not laterally displaced GI- soft, NT, ND, + BS Extremities- no clubbing, cyanosis, or edema MS- no significant deformity or atrophy Skin- no rash or lesion Psych- euthymic mood, full affect Neuro- strength and sensation are intact  EKG- Afib at 125 bpm, qrs int 140 ms, qtc 525 ms LAD, Nonspecific IVB Epic records reviewed    Assessment and Plan: 1. Persistent afib Pt has been back on  xarelto since 10/13 Scheduled for TEE guided cardioversion tomorrow  Risk vrs benefit and general description of procedure discussed, aware to arrive 7:30 am Bmet/cbc  done in ER 10/16  Has RVR but BP is soft and cannot increase rate control  Continue  Bisoprolol 5 mg 1/2 tab at  hs Pt is considering ablation after cardioversion  F/u with Dr. Aundra Dubin 10/30  Geroge Baseman. Pablo Stauffer, [facility] [address] [place], [place] [address] [phone]

## 2017-06-29 ENCOUNTER — Encounter (HOSPITAL_COMMUNITY)
Admission: RE | Disposition: A | Discharge status: Home / Self Care | Payer: Self-pay | Source: Ambulatory Visit | Attending: Cardiology

## 2017-06-29 ENCOUNTER — Ambulatory Visit (HOSPITAL_COMMUNITY)
Admission: RE | Admit: 2017-06-29 | Discharge: 2017-06-29 | Disposition: A | Discharge status: Home / Self Care | Payer: PPO | Source: Ambulatory Visit | Attending: Cardiology | Admitting: Cardiology

## 2017-06-29 ENCOUNTER — Ambulatory Visit (HOSPITAL_COMMUNITY): Payer: PPO | Admitting: Anesthesiology

## 2017-06-29 ENCOUNTER — Ambulatory Visit (HOSPITAL_BASED_OUTPATIENT_CLINIC_OR_DEPARTMENT_OTHER): Payer: PPO

## 2017-06-29 ENCOUNTER — Other Ambulatory Visit (HOSPITAL_COMMUNITY): Payer: Self-pay

## 2017-06-29 ENCOUNTER — Encounter (HOSPITAL_COMMUNITY): Payer: Self-pay | Admitting: *Deleted

## 2017-06-29 DIAGNOSIS — I4891 Unspecified atrial fibrillation: Secondary | ICD-10-CM | POA: Diagnosis not present

## 2017-06-29 DIAGNOSIS — M1990 Unspecified osteoarthritis, unspecified site: Secondary | ICD-10-CM | POA: Diagnosis not present

## 2017-06-29 DIAGNOSIS — I5022 Chronic systolic (congestive) heart failure: Secondary | ICD-10-CM | POA: Insufficient documentation

## 2017-06-29 DIAGNOSIS — I5043 Acute on chronic combined systolic (congestive) and diastolic (congestive) heart failure: Secondary | ICD-10-CM | POA: Diagnosis not present

## 2017-06-29 DIAGNOSIS — I517 Cardiomegaly: Secondary | ICD-10-CM | POA: Insufficient documentation

## 2017-06-29 DIAGNOSIS — I480 Paroxysmal atrial fibrillation: Secondary | ICD-10-CM | POA: Insufficient documentation

## 2017-06-29 DIAGNOSIS — Z881 Allergy status to other antibiotic agents status: Secondary | ICD-10-CM | POA: Insufficient documentation

## 2017-06-29 DIAGNOSIS — I493 Ventricular premature depolarization: Secondary | ICD-10-CM | POA: Diagnosis not present

## 2017-06-29 DIAGNOSIS — E785 Hyperlipidemia, unspecified: Secondary | ICD-10-CM | POA: Diagnosis not present

## 2017-06-29 DIAGNOSIS — I481 Persistent atrial fibrillation: Secondary | ICD-10-CM | POA: Diagnosis not present

## 2017-06-29 DIAGNOSIS — I38 Endocarditis, valve unspecified: Secondary | ICD-10-CM

## 2017-06-29 DIAGNOSIS — Z7901 Long term (current) use of anticoagulants: Secondary | ICD-10-CM | POA: Insufficient documentation

## 2017-06-29 DIAGNOSIS — Z96642 Presence of left artificial hip joint: Secondary | ICD-10-CM | POA: Diagnosis not present

## 2017-06-29 DIAGNOSIS — Z888 Allergy status to other drugs, medicaments and biological substances status: Secondary | ICD-10-CM | POA: Insufficient documentation

## 2017-06-29 DIAGNOSIS — Z79899 Other long term (current) drug therapy: Secondary | ICD-10-CM | POA: Diagnosis not present

## 2017-06-29 DIAGNOSIS — I110 Hypertensive heart disease with heart failure: Secondary | ICD-10-CM | POA: Diagnosis not present

## 2017-06-29 DIAGNOSIS — I351 Nonrheumatic aortic (valve) insufficiency: Secondary | ICD-10-CM | POA: Diagnosis not present

## 2017-06-29 HISTORY — PX: CARDIOVERSION: SHX1299

## 2017-06-29 HISTORY — PX: TEE WITHOUT CARDIOVERSION: SHX5443

## 2017-06-29 SURGERY — CARDIOVERSION
Anesthesia: Monitor Anesthesia Care

## 2017-06-29 MED ORDER — FENTANYL CITRATE (PF) 100 MCG/2ML IJ SOLN: 25.0000 ug | INTRAMUSCULAR | Status: DC | PRN

## 2017-06-29 MED ORDER — SODIUM CHLORIDE 0.9 % IV SOLN
INTRAVENOUS | Status: DC
Start: 2017-06-29 — End: 2017-06-29

## 2017-06-29 MED ORDER — LACTATED RINGERS IV SOLN
INTRAVENOUS | Status: DC | PRN
  Administered 2017-06-29 (×2): via INTRAVENOUS

## 2017-06-29 MED ORDER — PROPOFOL 10 MG/ML IV BOLUS
INTRAVENOUS | Status: DC | PRN
  Administered 2017-06-29: 20 mg via INTRAVENOUS

## 2017-06-29 MED ORDER — PROPOFOL 500 MG/50ML IV EMUL
INTRAVENOUS | Status: DC | PRN
  Administered 2017-06-29: 100 ug/kg/min via INTRAVENOUS

## 2017-06-29 MED ORDER — MEPERIDINE HCL 100 MG/ML IJ SOLN: 6.2500 mg | INTRAMUSCULAR | Status: DC | PRN

## 2017-06-29 MED ORDER — BUTAMBEN-TETRACAINE-BENZOCAINE 2-2-14 % EX AERO
INHALATION_SPRAY | CUTANEOUS | Status: DC | PRN
  Administered 2017-06-29: 2 via TOPICAL

## 2017-06-29 NOTE — Procedures (Signed)
Electrical Cardioversion Procedure Note Cheryl Vargas [id number] 1935-05-26  Procedure: Electrical Cardioversion Indications:  Atrial Fibrillation  Procedure Details Consent: Risks of procedure as well as the alternatives and risks of each were explained to the (patient/caregiver).  Consent for procedure obtained. Time Out: Verified patient identification, verified procedure, site/side was marked, verified correct patient position, special equipment/implants available, medications/allergies/relevent history reviewed, required imaging and test results available.  Performed  Patient placed on cardiac monitor, pulse oximetry, supplemental oxygen as necessary.  Sedation given: Propofol per anesthesiology. Pacer pads placed anterior and posterior chest.  Cardioverted 1 time(s).  Cardioverted at [place].  Evaluation Findings: Post procedure EKG shows: NSR Complications: None Patient did tolerate procedure well.   [name] 06/29/2017, 9:44 AM

## 2017-06-29 NOTE — Transfer of Care (Signed)
Immediate Anesthesia Transfer of Care Note  Patient: Cheryl Vargas  Procedure(s) Performed: CARDIOVERSION (N/A ) TRANSESOPHAGEAL ECHOCARDIOGRAM (TEE) (N/A )  Patient Location: PACU  Anesthesia Type:General  Level of Consciousness: awake, alert  and oriented  Airway & Oxygen Therapy: Patient Spontanous Breathing and Patient connected to nasal cannula oxygen  Post-op Assessment: Report given to RN and Post -op Vital signs reviewed and stable  Post vital signs: Reviewed and stable  Last Vitals:  Vitals:   06/29/17 0755 06/29/17 0954  BP: (!) 107/54 (!) 93/46  Pulse:  63  Resp: 19 17  Temp: 36.5 C 36.6 C  SpO2: 100% 97%    Last Pain:  Vitals:   06/29/17 0954  TempSrc: Oral         Complications: No apparent anesthesia complications

## 2017-06-29 NOTE — H&P (View-Only) (Signed)
Primary Care Physician: Lavone Orn, MD Referring Physician: [facility] ER f/u Cardiologist: Dr. Aundra Dubin EP: Dr. Manus Gunning P Cheryl Vargas is a 82 y.o. female with a h/o paroxysmal afib that presented in afib for several days to the [facility] ER 10/16 . She was fatigued . Chadsvasc score is 5 and pt is on xarelto but had been taking inconsistently, so she was scheduled tomorrow for outpt TEE guided cardioversion with Dr. Aundra Dubin..She has now been taking xarelto  on a daily basis since Saturday. She was started on IV Cardizem in ER but stopped 2/2 soft BP.  She has chronic systolic heart failure followed by Dr. Aundra Dubin. She has felt the need to take lasix for the last two days but her weight is stable. V rate is 125 bpm. She has tried amiodarone in the past but had nausea. She is not a candidate for other antiarrythmic's due to h/o long qt. Has been evaluated for ablation by Dr. Rayann Heman, but pt was not ready, left atrium size thought to possibly diminish chances of success.   She is in the afib clinic today for f/u of ER and discuss procedure of cardioversion as pt has not had this done in  the past.  Today, she denies symptoms of palpitations, chest pain, shortness of breath, orthopnea, PND, lower extremity edema, dizziness, presyncope, syncope, or neurologic sequela. The patient is tolerating medications without difficulties and is otherwise without complaint today.   Past Medical History:  Diagnosis Date  . Arthritis   . Chronic combined systolic (congestive) and diastolic (congestive) heart failure ([place])   . Dysrhythmia    pvc's per pt  . Enlarged heart   . Paroxysmal A-fib ([place])   . PONV (postoperative nausea and vomiting)    Past Surgical History:  Procedure Laterality Date  . BUNIONECTOMY    . CARDIAC CATHETERIZATION     in 2004 at [facility]. "Insignificant blockage" per patient  . CARDIAC CATHETERIZATION N/A 04/25/2016   Procedure: Left Heart Cath and Coronary Angiography;  Surgeon:  Nelva Bush, MD;  Location: [facility];  Service: Cardiovascular;  Laterality: N/A;  . SHOULDER SURGERY     closed reduction  . TONSILLECTOMY    . TOTAL HIP ARTHROPLASTY Left 03/18/2013   Procedure: TOTAL HIP ARTHROPLASTY ANTERIOR APPROACH;  Surgeon: Mauri Pole, MD;  Location: [facility];  Service: Orthopedics;  Laterality: Left;  . TUBAL LIGATION      Current Outpatient Prescriptions  Medication Sig Dispense Refill  . acetaminophen (TYLENOL) 500 MG tablet Take 500 mg by mouth every 6 (six) hours as needed for moderate pain.    . bisoprolol (ZEBETA) 5 MG tablet Take 0.5 tablets (2.5 mg total) by mouth at bedtime. 30 tablet 11  . fluticasone (FLONASE) 50 MCG/ACT nasal spray Place 1 spray into both nostrils daily as needed for allergies or rhinitis.    . furosemide (LASIX) 20 MG tablet Take 1 tablet (20 mg total) by mouth as needed for fluid or edema. 15 tablet 3  . losartan (COZAAR) 25 MG tablet Take 0.5 tablets (12.5 mg total) by mouth at bedtime. 45 tablet 3  . naphazoline-pheniramine (NAPHCON-A) 0.025-0.3 % ophthalmic solution Place 1 drop into both eyes daily as needed. For dry eyes    . Rivaroxaban (XARELTO) 15 MG TABS tablet Take 1 tablet (15 mg total) by mouth daily with supper. 30 tablet 11  . zolpidem (AMBIEN) 5 MG tablet Take 5 mg by mouth at bedtime.      No  current facility-administered medications for this encounter.     Allergies  Allergen Reactions  . Alendronate Sodium Other (See Comments)    Arm pain  . Amiodarone Nausea Only  . Ciprofloxacin     Cipro= not effective  . Coreg [Carvedilol]     Fatigue   . Metoprolol     Profound fatigue    Social History   Social History  . Marital status: Widowed    Spouse name: N/A  . Number of children: N/A  . Years of education: N/A   Occupational History  . Not on file.   Social History Main Topics  . Smoking status: Never Smoker  . Smokeless tobacco: Never Used  . Alcohol use 1.8 oz/week    3 Glasses  of wine per week     Comment: per week  . Drug use: No  . Sexual activity: Not on file   Other Topics Concern  . Not on file   Social History Narrative  . No narrative on file    Family History  Problem Relation Age of Onset  . Lung cancer Mother   . Heart attack Father     ROS- All systems are reviewed and negative except as per the HPI above  Physical Exam: Vitals:   06/28/17 1128  BP: 98/60  Pulse: (!) 125  Weight: 144 lb (65.3 kg)  Height: 5\' 4"  (1.626 m)   Wt Readings from Last 3 Encounters:  06/28/17 144 lb (65.3 kg)  03/05/17 143 lb (64.9 kg)  11/13/16 144 lb 8 oz (65.5 kg)    Labs: Lab Results  Component Value Date   NA 136 06/26/2017   K 4.2 06/26/2017   CL 105 06/26/2017   CO2 22 06/26/2017   GLUCOSE 119 (H) 06/26/2017   BUN 19 06/26/2017   CREATININE 0.94 06/26/2017   CALCIUM 8.8 (L) 06/26/2017   MG 1.9 06/26/2017   Lab Results  Component Value Date   INR 1.25 04/24/2016   No results found for: CHOL, HDL, LDLCALC, TRIG   GEN- The patient is well appearing, alert and oriented x 3 today.   Head- normocephalic, atraumatic Eyes-  Sclera clear, conjunctiva pink Ears- hearing intact Oropharynx- clear Neck- supple, no JVP Lymph- no cervical lymphadenopathy Lungs- Clear to ausculation bilaterally, normal work of breathing Heart- irregular rate and rhythm, no murmurs, rubs or gallops, PMI not laterally displaced GI- soft, NT, ND, + BS Extremities- no clubbing, cyanosis, or edema MS- no significant deformity or atrophy Skin- no rash or lesion Psych- euthymic mood, full affect Neuro- strength and sensation are intact  EKG- Afib at 125 bpm, qrs int 140 ms, qtc 525 ms LAD, Nonspecific IVB Epic records reviewed    Assessment and Plan: 1. Persistent afib Pt has been back on  xarelto since 10/13 Scheduled for TEE guided cardioversion tomorrow  Risk vrs benefit and general description of procedure discussed, aware to arrive 7:30 am Bmet/cbc  done in ER 10/16  Has RVR but BP is soft and cannot increase rate control  Continue  Bisoprolol 5 mg 1/2 tab at  hs Pt is considering ablation after cardioversion  F/u with Dr. Aundra Dubin 10/30  Geroge Baseman. Shireen Rayburn, [facility] [address] [place], [place] [address] [phone]

## 2017-06-29 NOTE — Discharge Instructions (Signed)
Electrical Cardioversion, Care After °This sheet gives you information about how to care for yourself after your procedure. Your health care provider may also give you more specific instructions. If you have problems or questions, contact your health care provider. °What can I expect after the procedure? °After the procedure, it is common to have: °· Some redness on the skin where the shocks were given. ° °Follow these instructions at home: °· Do not drive for 24 hours if you were given a medicine to help you relax (sedative). °· Take over-the-counter and prescription medicines only as told by your health care provider. °· Ask your health care provider how to check your pulse. Check it often. °· Rest for 48 hours after the procedure or as told by your health care provider. °· Avoid or limit your caffeine use as told by your health care provider. °Contact a health care provider if: °· You feel like your heart is beating too quickly or your pulse is not regular. °· You have a serious muscle cramp that does not go away. °Get help right away if: °· You have discomfort in your chest. °· You are dizzy or you feel faint. °· You have trouble breathing or you are short of breath. °· Your speech is slurred. °· You have trouble moving an arm or leg on one side of your body. °· Your fingers or toes turn cold or blue. °This information is not intended to replace advice given to you by your health care provider. Make sure you discuss any questions you have with your health care provider. °Document Released: 06/18/2013 Document Revised: 03/31/2016 Document Reviewed: 03/03/2016 °Elsevier Interactive Patient Education © 2018 Elsevier Inc. ° °

## 2017-06-29 NOTE — CV Procedure (Signed)
Procedure: TEE  Indication: Atrial fibrillation  Sedation: Per anesthesiology  Findings: Please see echo section for full report.  The patient was in rapid atrial fibrillation with rate in the 140s-150s.  The left ventricle was normal in size, diffuse hypokinesis with EF estimated 20-25% (but as above in rapid atrial fibrillation so probably not an accurate estimation).  Normal RV size and systolic function.  Moderate left atrial enlargement, no LA appendage thrombus.  Mild right atrial enlargement.  Peak RV-RA gradient 25 mmHg.  There was mild mitral regurgitation.  Trileaflet aortic valve with mild aortic insufficiency, no aortic stenosis.  Normal caliber aorta with grade III plaque in the descending thoracic aorta.    Impression: proceed with DCCV  [name] 06/29/2017 9:44 AM

## 2017-06-29 NOTE — Anesthesia Postprocedure Evaluation (Signed)
Anesthesia Post Note  Patient: Dalynn Jhaveri Goodnough  Procedure(s) Performed: CARDIOVERSION (N/A ) TRANSESOPHAGEAL ECHOCARDIOGRAM (TEE) (N/A )     Patient location during evaluation: PACU Anesthesia Type: MAC Level of consciousness: awake and alert Pain management: pain level controlled Vital Signs Assessment: post-procedure vital signs reviewed and stable Respiratory status: spontaneous breathing, nonlabored ventilation, respiratory function stable and patient connected to nasal cannula oxygen Cardiovascular status: stable and blood pressure returned to baseline Postop Assessment: no apparent nausea or vomiting Anesthetic complications: no    Last Vitals:  Vitals:   06/29/17 1010 06/29/17 1020  BP: (!) 100/50 106/71  Pulse: (!) 59 62  Resp: 10 14  Temp:    SpO2: 98% 100%    Last Pain:  Vitals:   06/29/17 0954  TempSrc: Oral                 Aysel Gilchrest

## 2017-06-29 NOTE — Interval H&P Note (Signed)
History and Physical Interval Note:  06/29/2017 9:23 AM  Cheryl Vargas  has presented today for surgery, with the diagnosis of AFIB  The various methods of treatment have been discussed with the patient and family. After consideration of risks, benefits and other options for treatment, the patient has consented to  Procedure(s): CARDIOVERSION (N/A) TRANSESOPHAGEAL ECHOCARDIOGRAM (TEE) (N/A) as a surgical intervention .  The patient's history has been reviewed, patient examined, no change in status, stable for surgery.  I have reviewed the patient's chart and labs.  Questions were answered to the patient's satisfaction.     Ermal Haberer [employer]

## 2017-07-01 ENCOUNTER — Encounter (HOSPITAL_COMMUNITY): Payer: Self-pay | Admitting: Cardiology

## 2017-07-02 ENCOUNTER — Encounter (HOSPITAL_COMMUNITY): Payer: Self-pay | Admitting: Cardiology

## 2017-07-02 NOTE — Addendum Note (Signed)
Addendum  created 07/02/17 1432 by Janeece Riggers, MD   Anesthesia Staff edited

## 2017-07-03 ENCOUNTER — Ambulatory Visit (HOSPITAL_COMMUNITY): Payer: PPO | Attending: Cardiology

## 2017-07-03 ENCOUNTER — Other Ambulatory Visit: Payer: Self-pay

## 2017-07-03 DIAGNOSIS — I38 Endocarditis, valve unspecified: Secondary | ICD-10-CM | POA: Diagnosis not present

## 2017-07-03 DIAGNOSIS — I5022 Chronic systolic (congestive) heart failure: Secondary | ICD-10-CM

## 2017-07-03 DIAGNOSIS — E785 Hyperlipidemia, unspecified: Secondary | ICD-10-CM | POA: Diagnosis not present

## 2017-07-03 DIAGNOSIS — I4891 Unspecified atrial fibrillation: Secondary | ICD-10-CM | POA: Diagnosis not present

## 2017-07-03 DIAGNOSIS — I509 Heart failure, unspecified: Secondary | ICD-10-CM | POA: Insufficient documentation

## 2017-07-03 DIAGNOSIS — I110 Hypertensive heart disease with heart failure: Secondary | ICD-10-CM | POA: Insufficient documentation

## 2017-07-03 DIAGNOSIS — I083 Combined rheumatic disorders of mitral, aortic and tricuspid valves: Secondary | ICD-10-CM | POA: Insufficient documentation

## 2017-07-05 DIAGNOSIS — Z23 Encounter for immunization: Secondary | ICD-10-CM | POA: Diagnosis not present

## 2017-07-10 ENCOUNTER — Ambulatory Visit (HOSPITAL_COMMUNITY)
Admission: RE | Admit: 2017-07-10 | Discharge: 2017-07-10 | Disposition: A | Discharge status: Home / Self Care | Payer: PPO | Source: Ambulatory Visit | Attending: Cardiology | Admitting: Cardiology

## 2017-07-10 ENCOUNTER — Encounter (HOSPITAL_COMMUNITY): Payer: Self-pay | Admitting: Cardiology

## 2017-07-10 VITALS — Wt 145.8 lb

## 2017-07-10 DIAGNOSIS — I5042 Chronic combined systolic (congestive) and diastolic (congestive) heart failure: Secondary | ICD-10-CM | POA: Diagnosis not present

## 2017-07-10 DIAGNOSIS — I5022 Chronic systolic (congestive) heart failure: Secondary | ICD-10-CM | POA: Diagnosis not present

## 2017-07-10 DIAGNOSIS — I480 Paroxysmal atrial fibrillation: Secondary | ICD-10-CM | POA: Insufficient documentation

## 2017-07-10 DIAGNOSIS — I493 Ventricular premature depolarization: Secondary | ICD-10-CM | POA: Diagnosis not present

## 2017-07-10 DIAGNOSIS — Z7901 Long term (current) use of anticoagulants: Secondary | ICD-10-CM | POA: Insufficient documentation

## 2017-07-10 DIAGNOSIS — Z79899 Other long term (current) drug therapy: Secondary | ICD-10-CM | POA: Diagnosis not present

## 2017-07-10 DIAGNOSIS — I428 Other cardiomyopathies: Secondary | ICD-10-CM | POA: Insufficient documentation

## 2017-07-10 DIAGNOSIS — E785 Hyperlipidemia, unspecified: Secondary | ICD-10-CM | POA: Insufficient documentation

## 2017-07-10 DIAGNOSIS — I110 Hypertensive heart disease with heart failure: Secondary | ICD-10-CM | POA: Diagnosis not present

## 2017-07-10 MED ORDER — BISOPROLOL FUMARATE 5 MG PO TABS: 2.5000 mg | ORAL_TABLET | Freq: Two times a day (BID) | ORAL | 11 refills | Status: DC

## 2017-07-10 NOTE — Patient Instructions (Signed)
Increase Bisoprolol 2.5 mg (1/2 tab), twice a day  Follow up appointment with Dr. Rayann Heman for ablation   Your physician recommends that you schedule a follow-up appointment in: 2 months

## 2017-07-11 NOTE — Progress Notes (Signed)
[facility]  Primary Care: Dr Laurann Montana HF Cardiology: Dr. 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 Cardiomyopathy dates back to at least 2014 based on echoes (EF 30-35% in 2014).   Admitted 8/17 for fatigue and dyspnea.  She was seen by electrophysiology on 04/17/16, and was started on po Amiodarone as she reported increased palpitations and fatigue with frequent PVCs. Her BNP was elevated at 1592.6 and chest x ray was consistent with CHF.  She was diuresed.  With her complaints of new onset fatigue and dyspnea combined with her known LV dysfunction, it was felt that she would benefit from an ischemic evaluation by cath. She underwent left heart cath with normal cors. Amiodarone was later stopped due to nausea.  Discharge weight 143 pounds. Echo (8/17) with EF 40-45%, inferoseptal akinesis. Cardiac MRI 10/17 with EF 36%, LGE mid wall pattern in the basal to mid septum and inferior wall. Holter monitor in 9/17 with runs of atrial fibrillation/RVR.  She did not tolerate spironolactone.  She also stopped Lasix and lisinopril due to SBP primarily in 90s with some lightheadedness. She only uses Lasix prn.   She saw Dr. Rayann Heman and was offered atrial fibrillation ablation.  She decided to hold off on this.  She developed symptomatic atrial fibrillation with RVR again in 10/18, difficult to control rate.  She had TEE-guided DCCV with resumption of NSR.  She is in NSR today.  On TEE in rapid atrial fibrillation, EF 20-25%.  On TTE after DCCV, EF back to 40-45% range.  MR reported as moderate to severe but I reviewed echo and think it appears more in the moderate range.    She returns for followup of atrial fibrillation and CHF today.  She is feeling good back in NSR.  No dyspnea walking on flat ground or doing ADLs.  Legs feel somewhat weak.  No chest pain.  No  orthopnea/PND.  She has been taking Lasix once a week or so on average.  Weight has been stable.  Rare palpitations now.    Labs (8/17): K 4.2, creatinine 0.85, BNP 1593, HCT 43.2, TSH mild elevated 5.3, free T4 normal Labs (9/17): K 4.1, creatinine 0.98 => 0.92, BNP 258, SPEP negative Labs (2/18): K 4, creatinine 0.83, HCT 40.8 Labs (10/18): K 4.2, creatinine 0.94, hgb 14.9  ECG: NSR, PAC, LBBB 142 msec, QTc 484 msec   1. Chronic systolic CHF:  Nonischemic cardiomyopathy.   - Echo (2014): EF 30-35%. - Echo (2/17): EF 40-45% - Echo (8/17): EF 40-45%, mid to apical inferoseptal akinesis - Coronary angiography (8/17): No significant CAD.  - Cardiac MRI (10/17): EF 36%, moderate MR, normal RV size and systolic function, mid-wall LGE in the basal to mid septum and inferior wall.  - TEE (10/18): EF 20-25% but in rapid afib with HR up to 150 bpm, mild MR.  - Echo (10/18): EF 40-45%, diffuse hypokinesis, moderate LAE, reportedly moderate-severe MR (looks moderate on my review).  2. Atrial fibrillation: Paroxysmal.  Diagnosed 2/17.  Unable to tolerate amiodarone.  - Holter (9/17): atrial fibrillation with RVR runs, 5% PVCs.  3. Hyperlipidemia 4. PVCs: frequent.  5. Long QT interval  ROS: All systems reviewed and negative except as per HPI.   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 Occasional ETOH, never  heavy.    Current Outpatient Prescriptions  Medication Sig Dispense Refill  . acetaminophen (TYLENOL) 500 MG tablet Take 500 mg by mouth every 6 (six) hours as needed for moderate pain.    . bisoprolol (ZEBETA) 5 MG tablet Take 0.5 tablets (2.5 mg total) by mouth 2 (two) times daily. 30 tablet 11  . fluticasone (FLONASE) 50 MCG/ACT nasal spray Place 1 spray into both nostrils daily as needed for allergies or rhinitis.    . furosemide (LASIX) 20 MG tablet Take 1 tablet (20 mg total) by mouth as needed for fluid or edema. 15  tablet 3  . losartan (COZAAR) 25 MG tablet Take 0.5 tablets (12.5 mg total) by mouth at bedtime. 45 tablet 3  . naphazoline-pheniramine (NAPHCON-A) 0.025-0.3 % ophthalmic solution Place 1 drop into both eyes daily as needed. For dry eyes    . Rivaroxaban (XARELTO) 15 MG TABS tablet Take 1 tablet (15 mg total) by mouth daily with supper. 30 tablet 11  . zolpidem (AMBIEN) 5 MG tablet Take 5 mg by mouth at bedtime.      No current facility-administered medications for this encounter.     Vitals:   07/10/17 1133  Weight: 145 lb 12.8 oz (66.1 kg)    PHYSICAL EXAM: General: NAD Neck: No JVD, no thyromegaly or thyroid nodule.  Lungs: Clear to auscultation bilaterally with normal respiratory effort. CV: Nondisplaced PMI.  Heart regular S1/S2, no S3/S4, no murmur.  No peripheral edema.  No carotid bruit.  Normal pedal pulses.  Abdomen: Soft, nontender, no hepatosplenomegaly, no distention.  Skin: Intact without lesions or rashes.  Neurologic: Alert and oriented x 3.  Psych: Normal affect. Extremities: No clubbing or cyanosis.  HEENT: Normal.   ASSESSMENT & PLAN:  1. Chronic systolic CHF: Nonischemic cardiomyopathy.  EF 30-35% in 2014, most recent echo in 10/18 with EF 40-45%.  Admitted in 8/17 with CHF exacerbation.  Angiography showed no significant coronary disease.  Etiology of CMP is uncertain.  Possible viral myocarditis => cardiac MRI in 10/17 showed EF 36% and LGE pattern that could be consistent with prior myocarditis.  Has not appeared to have enough PVCs to cause PVC-mediated cardiomyopathy.  NYHA class II currently.  She is not volume overloaded.  She has had trouble tolerating even low doses of cardiac meds and currently only on low dose bisoprolol and low dose losartan in the evening.  - I will try to have her increase bisoprolol to 2.5 mg bid.  - Continue losartan 12.5 mg to be taken at bedtime.    - I do not think she needs regular Lasix, continue to use prn.   2. PVCs: H/o  frequent PVCs, however only 5% PVCs noted on 9/17 holter. She is on bisoprolol.    3. Atrial fibrillation: Paroxysmal. She is in NSR today.  She is very symptomatic when in atrial fibrillation. She had an episode in 10/18 requiring TEE-guided DCCV that was very hard to rate control.  - She has not been able to take amiodarone and her QTc has been too long for sotalol or Tikosyn.  She is not a candidate for Ic agents or Multaq.   - She saw Dr. Rayann Heman and was offered ablation in the past but refused.  After this last episode of symptomatic atrial fibrillation, she is willing to undergo ablation.  I think that this is her best option.  I am going to refer her back to see Dr. Rayann Heman.  I reviewed her echo, I think that  her mitral regurgitation is moderate.  - Increase bisoprolol as above.     - Continue Xarelto.  I reviewed her dose (15 mg daily) with our pharmacist and it is appropriate for her creatinine clearance.   Followup in 2 months.   [name] 07/11/2017

## 2017-07-12 ENCOUNTER — Telehealth: Payer: Self-pay | Admitting: Internal Medicine

## 2017-07-12 NOTE — Telephone Encounter (Signed)
Will have Melissa call to schedule with Dr Rayann Heman at his next available to discuss

## 2017-07-12 NOTE — Telephone Encounter (Signed)
New Message  Pt call requesting to speak with RN about a possible ablation .please call back to discuss

## 2017-07-25 ENCOUNTER — Encounter: Payer: Self-pay | Admitting: Internal Medicine

## 2017-07-25 ENCOUNTER — Ambulatory Visit: Payer: PPO | Admitting: Internal Medicine

## 2017-07-25 VITALS — BP 122/72 | HR 62 | Ht 65.0 in | Wt 147.4 lb

## 2017-07-25 DIAGNOSIS — I340 Nonrheumatic mitral (valve) insufficiency: Secondary | ICD-10-CM | POA: Diagnosis not present

## 2017-07-25 DIAGNOSIS — I493 Ventricular premature depolarization: Secondary | ICD-10-CM

## 2017-07-25 DIAGNOSIS — I428 Other cardiomyopathies: Secondary | ICD-10-CM

## 2017-07-25 DIAGNOSIS — I480 Paroxysmal atrial fibrillation: Secondary | ICD-10-CM

## 2017-07-25 NOTE — Addendum Note (Signed)
Addended by: Frederik Schmidt on: 07/25/2017 05:20 PM   Modules accepted: Orders

## 2017-07-25 NOTE — Patient Instructions (Signed)
Medication Instructions:  Your physician recommends that you continue on your current medications as directed. Please refer to the Current Medication list given to you today.  -- If you need a refill on your cardiac medications before your next appointment, please call your pharmacy. --  Labwork: None ordered  Testing/Procedures: None ordered  Follow-Up:  You have been referred to Dr. Roxy Manns for Afib/MR   Your physician wants you to follow-up in: 3 months with Dr. Rayann Heman.    Thank you for choosing [facility]!!   Frederik Schmidt, RN [phone]  Any Other Special Instructions Will Be Listed Below (If Applicable).

## 2017-07-25 NOTE — Progress Notes (Signed)
PCP: Lavone Orn, MD Primary Cardiologist: Dr Aundra Dubin Primary EP: Dr Micael Hampshire Cheryl Vargas is a 82 y.o. female who presents today for routine electrophysiology followup.  Since last being seen in our clinic, the patient reports doing reasonably well.  She continues to have occasional afib episodes.  She required cardioversion in October.  TEE at that time revealed EF 40-45% with moderate to severe MR.  Upon review by Dr Aundra Dubin, she is felt to have moderaet MR. Today, she denies symptoms of palpitations, chest pain, shortness of breath,  lower extremity edema, dizziness, presyncope, or syncope.  The patient is otherwise without complaint today.   Past Medical History:  Diagnosis Date  . Arthritis   . Chronic combined systolic (congestive) and diastolic (congestive) heart failure ([place])   . Dysrhythmia    pvc's per pt  . Enlarged heart   . Paroxysmal A-fib ([place])   . PONV (postoperative nausea and vomiting)    Past Surgical History:  Procedure Laterality Date  . BUNIONECTOMY    . CARDIAC CATHETERIZATION     in 2004 at [facility]. "Insignificant blockage" per patient  . SHOULDER SURGERY     closed reduction  . TONSILLECTOMY    . TUBAL LIGATION      ROS- all systems are reviewed and negatives except as per HPI above  Current Outpatient Medications  Medication Sig Dispense Refill  . acetaminophen (TYLENOL) 500 MG tablet Take 500 mg by mouth every 6 (six) hours as needed for moderate pain.    . bisoprolol (ZEBETA) 5 MG tablet Take 0.5 tablets (2.5 mg total) by mouth 2 (two) times daily. 30 tablet 11  . Docusate Calcium (STOOL SOFTENER PO) Take 1 capsule daily by mouth.    . fluticasone (FLONASE) 50 MCG/ACT nasal spray Place 1 spray into both nostrils daily as needed for allergies or rhinitis.    . furosemide (LASIX) 20 MG tablet Take 1 tablet (20 mg total) by mouth as needed for fluid or edema. 15 tablet 3  . losartan (COZAAR) 25 MG tablet Take 0.5 tablets (12.5 mg total) by mouth  at bedtime. 45 tablet 3  . naphazoline-pheniramine (NAPHCON-A) 0.025-0.3 % ophthalmic solution Place 1 drop into both eyes daily as needed. For dry eyes    . Rivaroxaban (XARELTO) 15 MG TABS tablet Take 1 tablet (15 mg total) by mouth daily with supper. 30 tablet 11  . zolpidem (AMBIEN) 5 MG tablet Take 5 mg by mouth at bedtime.      No current facility-administered medications for this visit.     Physical Exam: Vitals:   07/25/17 1633  BP: 122/72  Pulse: 62  SpO2: 97%  Weight: 147 lb 6.4 oz (66.9 kg)  Height: 5\' 5"  (1.651 m)    GEN- The patient is well appearing, alert and oriented x 3 today.   Head- normocephalic, atraumatic Eyes-  Sclera clear, conjunctiva pink Ears- hearing intact Oropharynx- clear Lungs- Clear to ausculation bilaterally, normal work of breathing Heart- Regular rate and rhythm, GI- soft, NT, ND, + BS Extremities- no clubbing, cyanosis, or edema  EKG tracing ordered today is personally reviewed and shows sinus rhythm 62 bpm, PR 170 msec, nonspecific ST/T changes  TEE reviewed Cath 04/25/2016 also reviewed  Assessment and Plan:  1. Paroxysmal atrial fibrillation In the setting of mitral regurgitation and severe LA enlargement.  Appears to be becoming persistent.  She has failed medical therapy with amiodarone. She really does not like anticoagulation and wishes to be able to stop  anticoagulation long term.  I have offered referral to Dr Roxy Manns to consider MV repair, LAA ligation, and MAZE.  Given her structural heart disease, I think that this may be her best option.  I worry that anticipated success rated with PVI would be low. Though her MR may only be moderate, I think that with symptomatic afib and severe LA enlargement, she should see Dr Roxy Manns for surgical consultation.  I have spoken with Dr Aundra Dubin who agrees with referral.  2. Nonischemic CM Stable  3. MR As above  Refer to Dr Roxy Manns Return to see me in 3 months  Very complication patient with  significant structural heart disease and afib.  A high level of decision making was required for this encounter.  [name] MD, [facility] 07/25/2017 4:52 PM

## 2017-08-13 ENCOUNTER — Other Ambulatory Visit: Payer: Self-pay

## 2017-08-13 ENCOUNTER — Institutional Professional Consult (permissible substitution): Payer: PPO | Admitting: Thoracic Surgery (Cardiothoracic Vascular Surgery)

## 2017-08-13 ENCOUNTER — Encounter: Payer: Self-pay | Admitting: Thoracic Surgery (Cardiothoracic Vascular Surgery)

## 2017-08-13 VITALS — BP 112/60 | HR 60 | Ht 65.0 in | Wt 144.0 lb

## 2017-08-13 DIAGNOSIS — I340 Nonrheumatic mitral (valve) insufficiency: Secondary | ICD-10-CM

## 2017-08-13 DIAGNOSIS — I480 Paroxysmal atrial fibrillation: Secondary | ICD-10-CM | POA: Diagnosis not present

## 2017-08-13 DIAGNOSIS — I5022 Chronic systolic (congestive) heart failure: Secondary | ICD-10-CM

## 2017-08-13 DIAGNOSIS — I428 Other cardiomyopathies: Secondary | ICD-10-CM

## 2017-08-13 DIAGNOSIS — I5023 Acute on chronic systolic (congestive) heart failure: Secondary | ICD-10-CM | POA: Insufficient documentation

## 2017-08-13 NOTE — Progress Notes (Signed)
[address]       [place] [address]             [place] REPORT  Referring Provider is [name], MD  Primary Cardiologist is [name], MD PCP is Lavone Orn, MD  Chief Complaint  Patient presents with  . New Patient (Initial Visit)    Mitral regurgitation and atrial fibrillation    HPI:  Patient is an 82 year old female with history of chronic combined systolic and diastolic congestive heart failure, nonischemic cardiomyopathy, recurrent paroxysmal atrial fibrillation on long-term anticoagulation, recent persistent atrial fibrillation requiring DC cardioversion, and mitral regurgitation who has been referred for surgical consultation to discuss treatment options for management of atrial fibrillation and mitral regurgitation.  The patient states that she has remained physically active and healthy for most of her adult life.  In the past she enjoyed playing tennis and was quite active until approximately for 5 years ago.  She fell and broke her hip in 2014.  Shortly after that she began experiencing palpitations.  In early 2017 she was diagnosed with paroxysmal atrial fibrillation.  She was initially evaluated by [facility] but more recently she has been followed by Dr. Aundra Dubin in the advanced heart failure clinic and Dr. already in the atrial fibrillation clinic.  Serial echocardiograms have demonstrated the presence of cardiomyopathy with ejection fraction estimated 30-35% when she initially presented in 2014.  She was hospitalized in August 2017 with acute exacerbation of congestive heart failure and atrial fibrillation.  Echocardiogram at that time revealed moderate mitral regurgitation, moderate aortic insufficiency, and ejection fraction estimated at 40-45%.  Diagnostic cardiac catheterization revealed normal coronary arteries with no significant coronary artery disease.  She was started on oral amiodarone but  it was later discontinued because of severe nausea.  Cardiac MRI revealed baseline ejection fraction 36% with mid wall late gadolinium enhancement extending through the basal anterior septum, inferior septum, and basal inferior wall.  There was moderate central mitral regurgitation.  Holter monitor revealed sinus rhythm with intermittent runs of atrial fibrillation and rapid ventricular response.  She was continued medical therapy although she did not tolerate Spironolactone and she has intermittently stopped both Lasix and lisinopril due to low blood pressure and some dizzy spells.  In early October she presented with another acute exacerbation of symptoms congestive heart failure in the setting of persistent atrial fibrillation.  She underwent TEE cardioversion on June 29, 2017 back into sinus rhythm.  Since then she states that she has had at least 2 or 3 more episodes where she feels as though her heart is out of rhythm.  She was seen in follow-up recently by Dr. Aundra Dubin and repeat echocardiogram revealed ejection fraction estimated 40-45% with moderate to severe mitral regurgitation.  The ERO was measured 0.18 cm using PISA corresponding to regurgitant volume estimated 30 mL.  There is at least moderate left atrial enlargement.  Patient was referred back to Dr. Rayann Heman in the atrial fibrillation clinic to reconsider catheter-based ablation.  The patient is felt to be relatively poor candidate due to the severity of underlying mitral regurgitation, and surgical consultation has been requested.  The patient is widowed and lives alone locally in [place].  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  She states that she has slowing down considerably over the last several  years.  This began when she broke her hip in 2014.  Her husband was ill for a period of time and passed away 3-1/2 years ago.   During this period of time she has developed progressive exertional shortness of breath, fatigue, decreased exercise tolerance.  She still remains entirely functionally independent and she walks on a treadmill routinely for exercise.  She now gets short of breath with moderate level activity such as bringing in groceries from the car.  This is begun to affect her daily lifestyle but she is comfortable at rest and with low level activities.  She denies any PND, orthopnea, or lower extremity edema.  Weight has been stable and she is not currently taking diuretics routinely.  She report occasional palpitations and she can tell when she is "out of rhythm".  She remains chronically anticoagulated using Xarelto.  She has had a few bleeding complications including an ocular bleed in her right eye and a ruptured Baker's cyst.   Past Medical History:  Diagnosis Date  . Arthritis   . Chronic combined systolic (congestive) and diastolic (congestive) heart failure ([place])   . Chronic systolic (congestive) heart failure ([place])   . Dysrhythmia    pvc's per pt  . Enlarged heart   . Mitral regurgitation   . Non-ischemic cardiomyopathy ([place])   . Paroxysmal A-fib ([place])   . Paroxysmal atrial fibrillation ([facility])   . Persistent atrial fibrillation ([place])   . PONV (postoperative nausea and vomiting)     Past Surgical History:  Procedure Laterality Date  . BUNIONECTOMY    . CARDIAC CATHETERIZATION     in 2004 at [facility]. "Insignificant blockage" per patient  . CARDIAC CATHETERIZATION N/A 04/25/2016   Procedure: Left Heart Cath and Coronary Angiography;  Surgeon: Nelva Bush, MD;  Location: [facility] CV LAB;  Service: Cardiovascular;  Laterality: N/A;  . CARDIOVERSION N/A 06/29/2017   Procedure: CARDIOVERSION;  Surgeon: Larey Dresser, MD;  Location: [facility] ENDOSCOPY;  Service: Cardiovascular;  Laterality: N/A;  . SHOULDER SURGERY     closed reduction  . TEE WITHOUT CARDIOVERSION N/A 06/29/2017   Procedure:  TRANSESOPHAGEAL ECHOCARDIOGRAM (TEE);  Surgeon: Larey Dresser, MD;  Location: [facility] ENDOSCOPY;  Service: Cardiovascular;  Laterality: N/A;  . TONSILLECTOMY    . TOTAL HIP ARTHROPLASTY Left 03/18/2013   Procedure: TOTAL HIP ARTHROPLASTY ANTERIOR APPROACH;  Surgeon: Mauri Pole, MD;  Location: [facility] ORS;  Service: Orthopedics;  Laterality: Left;  . TUBAL LIGATION      Family History  Problem Relation Age of Onset  . Lung cancer Mother   . Heart attack Father     Social History   Socioeconomic History  . Marital status: Widowed    Spouse name: Not on file  . Number of children: Not on file  . Years of education: Not on file  . Highest education level: Not on file  Social Needs  . Financial resource strain: Not on file  . Food insecurity - worry: Not on file  . Food insecurity - inability: Not on file  . Transportation needs - medical: Not on file  . Transportation needs - non-medical: Not on file  Occupational History  . Not on file  Tobacco Use  . Smoking status: Never Smoker  . Smokeless tobacco: Never Used  Substance and Sexual Activity  . Alcohol use: Yes    Alcohol/week: 1.8 oz    Types: 3 Glasses of wine per week    Comment: per week  . Drug use: No  .  Sexual activity: Not on file  Other Topics Concern  . Not on file  Social History Narrative  . Not on file    Current Outpatient Medications  Medication Sig Dispense Refill  . acetaminophen (TYLENOL) 500 MG tablet Take 500 mg by mouth every 6 (six) hours as needed for moderate pain.    . bisoprolol (ZEBETA) 5 MG tablet Take 0.5 tablets (2.5 mg total) by mouth 2 (two) times daily. 30 tablet 11  . Docusate Calcium (STOOL SOFTENER PO) Take 1 capsule daily by mouth.    . fluticasone (FLONASE) 50 MCG/ACT nasal spray Place 1 spray into both nostrils daily as needed for allergies or rhinitis.    . furosemide (LASIX) 20 MG tablet Take 1 tablet (20 mg total) by mouth as needed for fluid or edema. 15 tablet 3  . losartan  (COZAAR) 25 MG tablet Take 0.5 tablets (12.5 mg total) by mouth at bedtime. 45 tablet 3  . naphazoline-pheniramine (NAPHCON-A) 0.025-0.3 % ophthalmic solution Place 1 drop into both eyes daily as needed. For dry eyes    . Rivaroxaban (XARELTO) 15 MG TABS tablet Take 1 tablet (15 mg total) by mouth daily with supper. 30 tablet 11  . zolpidem (AMBIEN) 5 MG tablet Take 5 mg by mouth at bedtime.      No current facility-administered medications for this visit.     Allergies  Allergen Reactions  . Alendronate Sodium Other (See Comments)    Arm pain  . Amiodarone Nausea Only  . Ciprofloxacin     Cipro= not effective  . Coreg [Carvedilol]     Fatigue   . Metoprolol     Profound fatigue      Review of Systems:   General:  normal appetite, decreased energy, no weight gain, no weight loss, no fever  Cardiac:  no chest pain with exertion, no chest pain at rest, +SOB with exertion, no resting SOB, no PND, no orthopnea, + palpitations, + arrhythmia, + atrial fibrillation, no LE edema, occasional dizzy spells, no syncope  Respiratory:  + exertional shortness of breath, no home oxygen, no productive cough, + chronic dry cough, no bronchitis, no wheezing, no hemoptysis, no asthma, no pain with inspiration or cough, no sleep apnea, no CPAP at night  GI:   no difficulty swallowing, no reflux, no frequent heartburn, no hiatal hernia, no abdominal pain, no constipation, no diarrhea, no hematochezia, no hematemesis, no melena  GU:   no dysuria,  no frequency, no urinary tract infection, no hematuria, no kidney stones, no kidney disease  Vascular:  no pain suggestive of claudication, no pain in feet, no leg cramps, no varicose veins, no DVT, no non-healing foot ulcer  Neuro:   no stroke, no TIA's, no seizures, no headaches, no temporary blindness one eye,  no slurred speech, no peripheral neuropathy, no chronic pain, no instability of gait, no memory/cognitive dysfunction  Musculoskeletal: + mild  arthritis, no joint swelling, no myalgias, no difficulty walking, normal mobility   Skin:   no rash, no itching, no skin infections, no pressure sores or ulcerations  Psych:   no anxiety, no depression, no nervousness, no unusual recent stress  Eyes:   + blurry vision, no floaters, no recent vision changes, + wears glasses or contacts  ENT:   no hearing loss, no loose or painful teeth, no dentures, last saw dentist 2015  Hematologic:  + easy bruising, no abnormal bleeding, no clotting disorder, no frequent epistaxis  Endocrine:  no diabetes, does not check  CBG's at home     Physical Exam:   BP 112/60 (BP Location: Left Arm, Patient Position: Sitting, Cuff Size: Normal)   Pulse 60   Ht 5\' 5"  (1.651 m)   Wt 144 lb (65.3 kg)   SpO2 97%   BMI 23.96 kg/m   General:    well-appearing  HEENT:  Unremarkable   Neck:   no JVD, no bruits, no adenopathy   Chest:   clear to auscultation, symmetrical breath sounds, no wheezes, no rhonchi   CV:   RRR, grade II/VI systolic murmur at apex  Abdomen:  soft, non-tender, no masses   Extremities:  warm, well-perfused, pulses diminished but palpable, no LE edema  Rectal/GU  Deferred  Neuro:   Grossly non-focal and symmetrical throughout  Skin:   Clean and dry, no rashes, no breakdown   Diagnostic Tests:  Transthoracic Echocardiography  Patient:  Lasean, Rahming MR #:    [id number] Study Date: 03/17/2013 Gender:   F Age:    52 Height:   165.1cm Weight:   68.2kg BSA:    1.29m^2 Pt. Status: Room:    [facility] ATTENDING  Lavone Orn Charlynne Cousins, [place], [place], [place] SONOGRAPHER Tresa Res, RDCS cc:  ------------------------------------------------------------ LV EF: 30% -  35%  ------------------------------------------------------------ Indications:   429.3  Cardiomegaly.  ------------------------------------------------------------ History:  Risk factors: Left Hip Fracture.  ------------------------------------------------------------ Study Conclusions  - Left ventricle: The cavity size was normal. Systolic function was moderately to severely reduced. The estimated ejection fraction was in the range of 30% to 35%. Wall motion was normal; there were no regional wall motion abnormalities. There was an increased relative contribution of atrial contraction to ventricular filling. Doppler parameters are consistent with abnormal left ventricular relaxation (grade 1 diastolic dysfunction). - Aortic valve: Mild regurgitation. - Mitral valve: Mild regurgitation. - Pulmonary arteries: PA peak pressure: 21mm Hg (S). Transthoracic echocardiography. M-mode, complete 2D, spectral Doppler, and color Doppler. Height: Height: 165.1cm. Height: 65in. Weight: Weight: 68.2kg. Weight: 150lb. Body mass index: BMI: 25kg/m^2. Body surface area:  BSA: 1.98m^2. Blood pressure:   120/80. Patient status: Inpatient. Location: Emergency department.  ------------------------------------------------------------  ------------------------------------------------------------ Left ventricle: The cavity size was normal. Systolic function was moderately to severely reduced. The estimated ejection fraction was in the range of 30% to 35%. Wall motion was normal; there were no regional wall motion abnormalities. There was an increased relative contribution of atrial contraction to ventricular filling. Doppler parameters are consistent with abnormal left ventricular relaxation (grade 1 diastolic dysfunction).  ------------------------------------------------------------ Aortic valve:  Trileaflet; mildly thickened, mildly calcified leaflets. Mobility was not restricted. Doppler: Transvalvular velocity was within the normal range.  There was no stenosis. Mild regurgitation.  ------------------------------------------------------------ Aorta: Aortic root: The aortic root was normal in size.  ------------------------------------------------------------ Mitral valve:  Structurally normal valve.  Mobility was not restricted. Doppler: Transvalvular velocity was within the normal range. There was no evidence for stenosis. Mild regurgitation.  ------------------------------------------------------------ Left atrium: The atrium was normal in size.  ------------------------------------------------------------ Right ventricle: The cavity size was normal. Wall thickness was normal. Systolic function was normal.  ------------------------------------------------------------ Pulmonic valve:  Doppler: Transvalvular velocity was within the normal range. There was no evidence for stenosis.  ------------------------------------------------------------ Tricuspid valve:  Structurally normal valve.  Doppler: Transvalvular velocity was within the normal range. Mild regurgitation.  ------------------------------------------------------------ Pulmonary artery:  The main pulmonary artery was normal-sized. Systolic pressure was within the normal range.  ------------------------------------------------------------ Right  atrium: The atrium was normal in size.  ------------------------------------------------------------ Pericardium: There was no pericardial effusion.  ------------------------------------------------------------ Systemic veins: Inferior vena cava: The vessel was normal in size.  ------------------------------------------------------------  2D measurements    Normal Doppler        Normal Left ventricle         measurements LVID ED,  46.7 mm   43-52  Main pulmonary chord,             artery PLAX              Pressure, S  34 mm =30 LVID  ES,   42 mm   23-38           Hg chord,             Left ventricle PLAX              Ea, lat   6.09 cm/ ------- FS, chord,  10 %   >29   ann, tiss     s PLAX              DP LVPW, ED  11.9 mm   ------ E/Ea, lat  10.11   ------- IVS/LVPW  0.76    <1.3  ann, tiss ratio, ED           DP Ventricular septum       Ea, med    3.7 cm/ ------- IVS, ED  9.08 mm   ------ ann, tiss     s Aorta             DP Root diam,  29 mm   ------ E/Ea, med  16.65   ------- ED               ann, tiss Left atrium          DP AP dim    38 mm   ------ Aortic valve AP dim   2.14 cm/m^2 <2.2  Regurg PHT  409 ms ------- index             Mitral valve                Peak E vel  61.6 cm/ -------                         s                Peak A vel  79.5 cm/ -------                         s                Deceleratio  158 ms 150-230                n time                Peak E/A   0.8   -------                ratio                Tricuspid valve                Regurg peak  267 cm/ -------                vel        s  Peak RV-RA   29 mm -------                gradient, S    Hg                Max regurg  267 cm/ -------                vel        s                Systemic veins                Estimated    5 mm -------                CVP        Hg                Right ventricle                Pressure, S  34 mm <30                          Hg  ------------------------------------------------------------ Prepared and Electronically Authenticated by  Fransico Him, MD [id number]:[id number]   Left Heart Cath and Coronary Angiography  Conclusion   1.  No angiographically significant coronary artery disease. 2.  Normal left ventricular filling pressure.  Plan: 1.  Proceed with echocardiogram today to reassess LV function. 2.  Medical therapy of non-ischemic cardiomyopathy and acute on chronic heart failure.  Indications   Acute on chronic combined systolic and diastolic heart failure (HCC) [I50.43 (ICD-10-CM)]  Procedural Details/Technique   Technical Details Indication: 82 y/o woman with history of systolic and diastolic heart failure and paroxysmal atrial fibrillation, admitted with acute decompensated heart failure. Prior echocardiogram in 10/2015 showed moderately reduced systolic function with inferior akinesis. The patient has therefore been referred for [facility]) with possible PCI.  Procedure: The risks, benefits, complications, treatment options, and expected outcomes were discussed with the patient. The patient and/or family concurred with the proposed plan, giving informed consent. The patient was brought to the cath lab after IV hydration was begun and oral premedication was given. The patient was further sedated with Versed and Fentanyl. The right wrist was assessed with a modified Allens test which was normal. The right wrist was prepped and draped in a sterile fashion. 1% lidocaine was used for local anesthesia. Using the modified Seldinger access technique, a 6 French Terumo slender Glidesheath was placed in the right radial artery. 3 mg Verapamil was given through the sheath. Heparin 3000 units were administered.  Selective coronary angiography was performed using 5 French JL3.5 and JR4 to engage the left and right coronary arteries, respectively. Left heart catheterization was performed using a 5 [place] JR4  catheter. Left ventriculogram was not performed due to renal insufficiency.  At the end of the procedure, the radial artery sheath was removed and a TR band applied to achieve patent hemostasis. There were no immediate complications. The patient was taken to the recovery area in stable condition.    Estimated blood loss <50 mL. . During this procedure the patient was administered the following to achieve and maintain moderate conscious sedation: Versed 0.5 mg, Fentanyl 25 mcg, while the patient's heart rate, blood pressure, and oxygen saturation were continuously monitored. The period of conscious sedation was 14 minutes, of which I was present face-to-face 100% of this time.  Complications   Complications documented before study  signed (04/25/2016 8:38 AM EDT)    No complications were associated with this study.  Documented by Nelva Bush, MD - 04/25/2016 8:18 AM EDT    Coronary Findings   Diagnostic  Dominance: Left  Left Anterior Descending  Vessel is large. Large wrap-around LAD supplying much of the inferior wall.  First Diagonal Branch  Vessel is moderate in size.  Left Circumflex  Vessel is large.  First Obtuse Marginal Branch  Vessel is small in size.  Second Obtuse Marginal Branch  Vessel is moderate in size. Posterolateral wall supplied by OM branch.  Third Obtuse Marginal Branch  Vessel is moderate in size. Posterolateral wall supplied by OM branch.  Right Coronary Artery  Vessel is small.  Intervention   No interventions have been documented.  Left Heart   Left Ventricle LV end diastolic pressure is normal.  Coronary Diagrams   Diagnostic Diagram       Implants     No implant documentation for this case.  MERGE Images   Show images for Cardiac catheterization   Link to Procedure Log   Procedure Log    Hemo Data    Most Recent Value  AO Systolic Pressure 94 mmHg  AO Diastolic Pressure 48 mmHg  AO Mean 65 mmHg  LV Systolic Pressure 540 mmHg   LV Diastolic Pressure 3 mmHg  LV EDP 11 mmHg  Arterial Occlusion Pressure Extended Systolic Pressure 086 mmHg  Arterial Occlusion Pressure Extended Diastolic Pressure 53 mmHg  Arterial Occlusion Pressure Extended Mean Pressure 76 mmHg  Left Ventricular Apex Extended Systolic Pressure 761 mmHg  Left Ventricular Apex Extended Diastolic Pressure 2 mmHg  Left Ventricular Apex Extended EDP Pressure 8 mmHg      CARDIAC MRI  TECHNIQUE: The patient was scanned on a 1.5 Tesla GE magnet. A dedicated cardiac coil was used. Functional imaging was done using Fiesta sequences. 2,3, and 4 chamber views were done to assess for RWMA's. Modified Simpson's rule using a short axis stack was used to calculate an ejection fraction on a dedicated work Conservation officer, nature. The patient received 20 cc of Multihance. After 10 minutes inversion recovery sequences were used to assess for infiltration and scar tissue.  CONTRAST:  20 cc Multihance  FINDINGS: Limited images of the lung fields showed no gross abnormalities.  Mildly dilated left ventricle with normal wall thickness. EF 36%, diffuse hypokinesis. Normal right ventricular size and systolic function. Moderate left atrial enlargement, mild right atrial enlargement. Aortic valve was trileaflet with mild regurgitation and no significant stenosis. There was moderate central mitral regurgitation.  On delayed enhancement imaging, there was mid-wall late gadolinium enhancement (LGE) extending through the basal anteroseptum/inferoseptum and the basal inferior wall. There was more patchy mid-wall LGE in the mid inferoseptal and mid inferior wall segments.  MEASUREMENTS: MEASUREMENTS LV EDV 213 mL  LV SV 77 mL  LV EF 36%  IMPRESSION: 1.  Mildly dilated LV with EF 36%, diffuse hypokinesis.  2.  Normal RV size and systolic function.  3.  Biatrial enlargement.  4.  Moderate mitral regurgitation.  5. Non-coronary LGE  pattern involving the mid-wall of the basal to mid septum and inferior wall. Possibly prior myocarditis versus a form of infiltrative disease.  Dalton Mclean   Electronically Signed   By: [name] M.D.   On: 06/16/2016 17:46    Transesophageal Echocardiography with Cardioversion  Patient:    Omunique, Pederson MR #:       [id number] Study Date: 06/29/2017 Gender:  F Age:        82 Height:     162.6 cm Weight:     65.5 kg BSA:        1.73 m^2 Pt. Status: Room:   SONOGRAPHER  [name], RDCS  ORDERING     [name], M.D.  PERFORMING   [name], M.D.  REFERRING    [name], M.D.  cc:  ------------------------------------------------------------------- LV EF: 20% -   25%  ------------------------------------------------------------------- Indications:      Atrial fibrillation - 427.31.  ------------------------------------------------------------------- History:   PMH:   Atrial fibrillation.  ------------------------------------------------------------------- Study Conclusions  - Left ventricle: The cavity size was normal. Wall thickness was   normal. Systolic function was severely reduced. The estimated   ejection fraction was in the range of 20% to 25%. The patient was   in rapid atrial fibrillation with HR up to 150s, suspect EF looks   worse because of this. Diffuse hypokinesis. No evidence of   thrombus. - Aortic valve: There was no stenosis. There was mild   regurgitation. - Aorta: Normal caliber aorta with grade III plaque in the   descending thoracic aorta. - Mitral valve: There was mild regurgitation. - Left atrium: The atrium was moderately dilated. No evidence of   thrombus in the atrial cavity or appendage. - Right ventricle: The cavity size was normal. Systolic function   was normal. - Right atrium: The atrium was mildly dilated. - Tricuspid valve: Peak RV-RA gradient 25 mmHg.  Impressions:  - Successful  cardioversion. No cardiac source of emboli was   indentified.  ------------------------------------------------------------------- Study data:   Study status:  Routine.  Consent:  The risks, benefits, and alternatives to the procedure were explained to the patient and informed consent was obtained.  Procedure:  The patient reported no pain pre or post test. Initial setup. The patient was brought to the laboratory in the fasting state. A baseline ECG was recorded. Intravenous access was obtained. Surface ECG leads and pulse oximetric signals were monitored. Self-adhesive anterior-posterior defibrillation pads were applied. Sedation. Moderate sedation with intermittent deep sedation was administered during cardioversion by anesthesiology staff. Transesophageal echocardiography. An adult multiplane transesophageal probe was inserted by the attending cardiologistwithout difficulty. Image quality was adequate. Images were captured in a quad screen format to simplify data comparison. No intracardiac thrombus was identified.  Cardioversion. The rhythm was successfully converted from atrial fibrillation to normal sinus rhythm.  Study completion:  All IVs inserted during the procedure were removed. The patient tolerated the procedure well. There were no complications. Administered medications:   Propofol.          Transesophageal echocardiography with cardioversion.  2D and intravenous contrast injection.  Birthdate:  Patient birthdate: October 04, 1934.  Age: Patient is 82 yr old.  Sex:  Gender: female.    BMI: 24.8 kg/m^2. Blood pressure:     93/46  Patient status:  Outpatient.  Study date:  Study date: 06/29/2017. Study time: 09:24 AM.  Location: Endoscopy.  -------------------------------------------------------------------  ------------------------------------------------------------------- Left ventricle:  The cavity size was normal. Wall thickness was normal. Systolic function was  severely reduced. The estimated ejection fraction was in the range of 20% to 25%. The patient was in rapid atrial fibrillation with HR up to 150s, suspect EF looks worse because of this. Diffuse hypokinesis.  No evidence of thrombus.  ------------------------------------------------------------------- Aortic valve:   Trileaflet.  Doppler:   There was no stenosis. There was mild regurgitation.  ------------------------------------------------------------------- Aorta:  Normal caliber aorta with grade III  plaque in the descending thoracic aorta.  ------------------------------------------------------------------- Mitral valve:   Normal thickness leaflets .  Doppler:   There was no evidence for stenosis.   There was mild regurgitation.  ------------------------------------------------------------------- Left atrium:  The atrium was moderately dilated.  No evidence of thrombus in the atrial cavity or appendage.  ------------------------------------------------------------------- Right ventricle:  The cavity size was normal. Systolic function was normal.  ------------------------------------------------------------------- Pulmonic valve:    Structurally normal valve.   Cusp separation was normal.  ------------------------------------------------------------------- Tricuspid valve:   Doppler:  There was trivial regurgitation. Peak RV-RA gradient 25 mmHg.  ------------------------------------------------------------------- Right atrium:  The atrium was mildly dilated.  ------------------------------------------------------------------- Pericardium:  There was no pericardial effusion.   ------------------------------------------------------------------- Post procedure conclusions Ascending Aorta:  - Normal caliber aorta with grade III plaque in the descending   thoracic aorta.  ------------------------------------------------------------------- Prepared and  Electronically Authenticated by  [name], M.D. 2018-10-31T09:02:00    Transthoracic Echocardiography  Patient:    Demesha, Boorman MR #:       [id number] Study Date: 07/03/2017 Gender:     F Age:        11 Height:     162.6 cm Weight:     65.3 kg BSA:        1.73 m^2 Pt. Status: Room:   ATTENDING    [name], M.D.  ORDERING     [name], M.D.  REFERRING    [name], M.D.  SONOGRAPHER  Wyatt Mage, RDCS  PERFORMING   [facility] Outpatient  cc:  ------------------------------------------------------------------- LV EF: 40% -   45%  ------------------------------------------------------------------- Indications:      CHF (I50.22).  ------------------------------------------------------------------- History:   PMH:   Atrial fibrillation.  Congestive heart failure. Risk factors:  Cardiomegaly. PVC. Hypertension. Dyslipidemia.  ------------------------------------------------------------------- Study Conclusions  - Left ventricle: The cavity size was mildly dilated. Systolic   function was mildly to moderately reduced. The estimated ejection   fraction was in the range of 40% to 45%. Diffuse hypokinesis.   Doppler parameters are consistent with restrictive physiology,   indicative of decreased left ventricular diastolic compliance   and/or increased left atrial pressure. Doppler parameters are   consistent with elevated ventricular end-diastolic filling   pressure. - Aortic valve: There was mild regurgitation. - Mitral valve: There was moderate to severe regurgitation directed   centrally and posteriorly. - Left atrium: The atrium was moderately dilated. - Right atrium: The atrium was mildly dilated. - Tricuspid valve: There was moderate regurgitation. - Pulmonary arteries: Systolic pressure was moderately increased.   PA peak pressure: 48 mm Hg (S). - Line: A venous catheter was visualized in the superior vena cava,   with its tip in the  right atrium. No abnormal features noted.  ------------------------------------------------------------------- Labs, prior tests, procedures, and surgery: Transthoracic echocardiography (04/25/2016).     EF was 40% and PA pressure was 50 (systolic).  ------------------------------------------------------------------- Study data:  Comparison was made to the study of 04/25/2016.  Study status:  Routine.  Procedure:  The patient reported no pain pre or post test. Transthoracic echocardiography. Image quality was adequate.  Study completion:  There were no complications. Transthoracic echocardiography.  M-mode, complete 2D, 3D, spectral Doppler, and color Doppler.  Birthdate:  Patient birthdate: November 14, 1934.  Age:  Patient is 82 yr old.  Sex:  Gender: female. BMI: 24.7 kg/m^2.  Blood pressure:     122/72  Patient status: Outpatient.  Study date:  Study date: 07/03/2017. Study time: 04:13 PM.  Location:  [facility]  3  -------------------------------------------------------------------  ------------------------------------------------------------------- Left ventricle:  The cavity size was mildly dilated. Systolic function was mildly to moderately reduced. The estimated ejection fraction was in the range of 40% to 45%. Diffuse hypokinesis. Doppler parameters are consistent with restrictive physiology, indicative of decreased left ventricular diastolic compliance and/or increased left atrial pressure. Doppler parameters are consistent with elevated ventricular end-diastolic filling pressure.  ------------------------------------------------------------------- Aortic valve:   Trileaflet; mildly thickened, mildly calcified leaflets. Mobility was not restricted. Sclerosis without stenosis. Doppler:  Transvalvular velocity was within the normal range. There was no stenosis. There was mild regurgitation.  ------------------------------------------------------------------- Aorta:   Aortic root: The aortic root was normal in size.  ------------------------------------------------------------------- Mitral valve:   Moderately thickened, mildly calcified leaflets . Mobility was not restricted.  Doppler:  Transvalvular velocity was within the normal range. There was no evidence for stenosis. There was moderate to severe regurgitation directed centrally and posteriorly.    Peak gradient (D): 5 mm Hg.  ------------------------------------------------------------------- Left atrium:  The atrium was moderately dilated.  ------------------------------------------------------------------- Right ventricle:  The cavity size was normal. Wall thickness was normal. Pacer wire or catheter noted in right ventricle. Systolic function was normal.  ------------------------------------------------------------------- Pulmonic valve:    Structurally normal valve.   Cusp separation was normal.  Doppler:  Transvalvular velocity was within the normal range. There was no evidence for stenosis. There was mild regurgitation.  ------------------------------------------------------------------- Tricuspid valve:   Structurally normal valve.    Doppler: Transvalvular velocity was within the normal range. There was moderate regurgitation.  ------------------------------------------------------------------- Pulmonary artery:   The main pulmonary artery was normal-sized. Systolic pressure was moderately increased.  ------------------------------------------------------------------- Right atrium:  The atrium was mildly dilated.  ------------------------------------------------------------------- Pericardium:  There was no pericardial effusion.  ------------------------------------------------------------------- Systemic veins: Line: A venous catheter was visualized in the superior vena cava, with its tip in the right atrium. No abnormal features noted. Inferior vena cava: The  vessel was normal in size. The respirophasic diameter changes were in the normal range (>= 50%), consistent with normal central venous pressure.  ------------------------------------------------------------------- Measurements   Left ventricle                           Value        Reference  LV ID, ED, PLAX chordal          (H)     54.3  mm     43 - 52  LV ID, ES, PLAX chordal          (H)     38.9  mm     23 - 38  LV fx shortening, PLAX chordal   (L)     28    %      >=29  LV PW thickness, ED                      9.22  mm     ---------  IVS/LV PW ratio, ED                      0.87         <=1.3  Stroke volume, 2D                        78    ml     ---------  Stroke volume/bsa, 2D  45    ml/m^2 ---------  LV e&', lateral                           11    cm/s   ---------  LV E/e&', lateral                         10.36        ---------  LV e&', medial                            3.45  cm/s   ---------  LV E/e&', medial                          33.04        ---------  LV e&', average                           7.23  cm/s   ---------  LV E/e&', average                         15.78        ---------    Ventricular septum                       Value        Reference  IVS thickness, ED                        8.04  mm     ---------    LVOT                                     Value        Reference  LVOT ID, S                               21    mm     ---------  LVOT area                                3.46  cm^2   ---------  LVOT peak velocity, S                    96.8  cm/s   ---------  LVOT mean velocity, S                    66.4  cm/s   ---------  LVOT VTI, S                              22.4  cm     ---------    Aortic valve                             Value        Reference  Aortic regurg pressure half-time         827   ms     ---------  Aorta                                    Value        Reference  Aortic root ID, ED                       30    mm      ---------    Left atrium                              Value        Reference  LA ID, A-P, ES                           45    mm     ---------  LA ID/bsa, A-P                   (H)     2.6   cm/m^2 <=2.2  LA volume, S                             85.1  ml     ---------  LA volume/bsa, S                         49.2  ml/m^2 ---------  LA volume, ES, 1-p A4C                   86.3  ml     ---------  LA volume/bsa, ES, 1-p A4C               49.9  ml/m^2 ---------  LA volume, ES, 1-p A2C                   80.8  ml     ---------  LA volume/bsa, ES, 1-p A2C               46.8  ml/m^2 ---------    Mitral valve                             Value        Reference  Mitral E-wave peak velocity              114   cm/s   ---------  Mitral deceleration time                 165   ms     150 - 230  Mitral peak gradient, D                  5     mm Hg  ---------  Mitral regurg VTI, PISA                  166   cm     ---------  Mitral ERO, PISA                         0.18  cm^2   ---------  Mitral regurg volume, PISA               30  ml     ---------    Pulmonary arteries                       Value        Reference  PA pressure, S, DP               (H)     48    mm Hg  <=30    Tricuspid valve                          Value        Reference  Tricuspid regurg peak velocity           334   cm/s   ---------  Tricuspid peak RV-RA gradient            45    mm Hg  ---------    Right ventricle                          Value        Reference  TAPSE                                    25.2  mm     ---------  RV s&', lateral, S                        10.8  cm/s   ---------  Legend: (L)  and  (H)  mark values outside specified reference range.  ------------------------------------------------------------------- Prepared and Electronically Authenticated by  Ena Dawley, M.D. 2018-10-24T07:01:36   Impression:  Patient has chronic recurrent paroxysmal atrial fibrillation with recent episode of  persistent atrial fibrillation requiring DC cardioversion, and at least moderate mitral regurgitation.  She currently describes stable symptoms of exertional shortness of breath and fatigue consistent with chronic combined systolic and diastolic congestive heart failure, [place] Heart Association functional class II.  She remains fairly stable on medical therapy as long as she remains in sinus rhythm, but she gets very symptomatic when she is in atrial fibrillation.  I have personally reviewed the patient's recent transthoracic and transesophageal echocardiograms and previous diagnostic cardiac catheterization performed in 2017.   she has at least moderate global left ventricular systolic dysfunction.  There is at least moderate (3+) central mitral regurgitation.  There appears to be fairly normal mitral valve leaflet mobility with primarily type I dysfunction.  There is moderate to severe left atrial enlargement.  Previous catheterization was notable for the absence of significant coronary artery disease.  Options include continued medical therapy with or without an attempt at catheter-based ablation for atrial fibrillation versus mitral valve repair and Maze procedure.  I agree that given the severity of mitral regurgitation and the degree of left atrial enlargement the success rate of catheter-based ablation would probably be relatively poor.  The patient would likely be a good candidate for minimally invasive approach for mitral valve repair and Maze procedure.   Plan:  The patient was counseled at length regarding the indications, risks and potential benefits of mitral valve repair and maze procedure.  The rationale for elective surgery has been explained, including a comparison between surgery and continued medical therapy with close follow-up.  The likelihood of successful and durable valve repair has been discussed with particular reference  to the findings of their recent echocardiogram.  Based upon  these findings and previous experience, I have quoted them a greater than 95 percent likelihood of successful valve repair.  The relative risks and benefits of performing a maze procedure at the time of their surgery was discussed at length, including the expected likelihood of long term freedom from recurrent symptomatic atrial fibrillation and/or atrial flutter.  Alternative surgical approaches have been discussed including a comparison between conventional sternotomy and minimally invasive approach.  All of her questions have been addressed.  The patient desires to think matters over further before making a decision as to whether or not to proceed with surgery.  During the interim she will continue to follow-up with Dr. Aundra Dubin.  She will call and return to our office if she is interested in proceeding with surgery.  If so, we will plan CT angiography to further evaluate the feasibility of peripheral cannulation for surgery prior to her next office visit.    I spent in excess of 90 minutes during the conduct of this office consultation and >50% of this time involved direct face-to-face encounter with the patient for counseling and/or coordination of their care.    Valentina Gu. Roxy Manns, MD 08/13/2017 12:47 PM

## 2017-08-13 NOTE — Patient Instructions (Signed)
Continue all previous medications without any changes at this time  

## 2017-08-22 ENCOUNTER — Telehealth (HOSPITAL_COMMUNITY): Payer: Self-pay | Admitting: *Deleted

## 2017-08-22 ENCOUNTER — Ambulatory Visit (HOSPITAL_COMMUNITY)
Admission: RE | Admit: 2017-08-22 | Discharge: 2017-08-22 | Disposition: A | Discharge status: Home / Self Care | Payer: PPO | Source: Ambulatory Visit | Attending: Nurse Practitioner | Admitting: Nurse Practitioner

## 2017-08-22 ENCOUNTER — Encounter (HOSPITAL_COMMUNITY): Payer: Self-pay | Admitting: Nurse Practitioner

## 2017-08-22 VITALS — BP 122/74 | HR 131 | Ht 65.0 in | Wt 146.2 lb

## 2017-08-22 DIAGNOSIS — I481 Persistent atrial fibrillation: Secondary | ICD-10-CM | POA: Diagnosis not present

## 2017-08-22 DIAGNOSIS — I340 Nonrheumatic mitral (valve) insufficiency: Secondary | ICD-10-CM | POA: Insufficient documentation

## 2017-08-22 DIAGNOSIS — I454 Nonspecific intraventricular block: Secondary | ICD-10-CM | POA: Insufficient documentation

## 2017-08-22 DIAGNOSIS — I480 Paroxysmal atrial fibrillation: Secondary | ICD-10-CM | POA: Insufficient documentation

## 2017-08-22 DIAGNOSIS — I5042 Chronic combined systolic (congestive) and diastolic (congestive) heart failure: Secondary | ICD-10-CM | POA: Insufficient documentation

## 2017-08-22 DIAGNOSIS — Z96642 Presence of left artificial hip joint: Secondary | ICD-10-CM | POA: Diagnosis not present

## 2017-08-22 DIAGNOSIS — Z79899 Other long term (current) drug therapy: Secondary | ICD-10-CM | POA: Diagnosis not present

## 2017-08-22 NOTE — Telephone Encounter (Signed)
Pt called to report she is back in a-fib.  She states she woke up at 4 am to use the bathroom and could feel it.  She states she went ahead and took her AM dose of Bisoprolol but it has not helped so far.  She states her HR has been runing 90s-130s most recently staying around 133.  She denies SOB just states she feel tired.  BP 90/69.  Discussed w/a-fib clinic, they will see pt today at 3 pm.  Pt is aware and agreeable.  If she goes back into normal rhythm before then she will call back to cancel.

## 2017-08-22 NOTE — Patient Instructions (Signed)
Try increasing bisoprolol to 5mg  twice a day -- call us tomorrow

## 2017-08-23 ENCOUNTER — Telehealth (HOSPITAL_COMMUNITY): Payer: Self-pay | Admitting: *Deleted

## 2017-08-23 ENCOUNTER — Other Ambulatory Visit (HOSPITAL_COMMUNITY): Payer: Self-pay | Admitting: *Deleted

## 2017-08-23 NOTE — Progress Notes (Signed)
Primary Care Physician: Lavone Orn, MD Referring Physician: [facility] - Closed ER f/u Cardiologist: Dr. Aundra Dubin EP: Dr. Manus Gunning P Jewell is a 82 y.o. female with a h/o paroxysmal afib that is becoming more persistent. She recently saw Dr. Rayann Heman and he thought her long range ability to stay in SR was poor. He did not feel she was a good ablation candidate. She has not tolerated metoprolol, cardizem or amiodarone in  the past.  He referred her to Dr. Roxy Manns to discuss MVR and MAZE.  She at this point has decided not to do this. She called the afib clinic this am wanting an urgent appointment as she went into afib at 4:30 this am. She continues in afib with RVR although she now feels better than earlier today. Chadsvasc score is 5 and pt is on xarelto and has been taking consistently.  She has chronic systolic heart failure followed by Dr. Aundra Dubin. Her  weight is stable. She is not a candidate for other antiarrythmic's due to h/o long qt.  Today, she denies symptoms of palpitations, chest pain, shortness of breath, orthopnea, PND, lower extremity edema, dizziness, presyncope, syncope, or neurologic sequela. The patient is tolerating medications without difficulties and is otherwise without complaint today.   Past Medical History:  Diagnosis Date  . Arthritis   . Chronic combined systolic (congestive) and diastolic (congestive) heart failure ([place])   . Chronic systolic (congestive) heart failure ([place])   . Dysrhythmia    pvc's per pt  . Enlarged heart   . Mitral regurgitation   . Non-ischemic cardiomyopathy ([place])   . Paroxysmal A-fib ([place])   . Paroxysmal atrial fibrillation ([facility])   . Persistent atrial fibrillation ([place])   . PONV (postoperative nausea and vomiting)    Past Surgical History:  Procedure Laterality Date  . BUNIONECTOMY    . CARDIAC CATHETERIZATION     in 2004 at [facility]. "Insignificant blockage" per patient  . CARDIAC CATHETERIZATION N/A 04/25/2016   Procedure: Left Heart Cath  and Coronary Angiography;  Surgeon: Nelva Bush, MD;  Location: [place] CV LAB;  Service: Cardiovascular;  Laterality: N/A;  . CARDIOVERSION N/A 06/29/2017   Procedure: CARDIOVERSION;  Surgeon: Larey Dresser, MD;  Location: [facility] ENDOSCOPY;  Service: Cardiovascular;  Laterality: N/A;  . SHOULDER SURGERY     closed reduction  . TEE WITHOUT CARDIOVERSION N/A 06/29/2017   Procedure: TRANSESOPHAGEAL ECHOCARDIOGRAM (TEE);  Surgeon: Larey Dresser, MD;  Location: [facility] ENDOSCOPY;  Service: Cardiovascular;  Laterality: N/A;  . TONSILLECTOMY    . TOTAL HIP ARTHROPLASTY Left 03/18/2013   Procedure: TOTAL HIP ARTHROPLASTY ANTERIOR APPROACH;  Surgeon: Mauri Pole, MD;  Location: [facility] ORS;  Service: Orthopedics;  Laterality: Left;  . TUBAL LIGATION      Current Outpatient Medications  Medication Sig Dispense Refill  . acetaminophen (TYLENOL) 500 MG tablet Take 500 mg by mouth every 6 (six) hours as needed for moderate pain.    . bisoprolol (ZEBETA) 5 MG tablet Take 0.5 tablets (2.5 mg total) by mouth 2 (two) times daily. 30 tablet 11  . Docusate Calcium (STOOL SOFTENER PO) Take 1 capsule daily by mouth.    . fluticasone (FLONASE) 50 MCG/ACT nasal spray Place 1 spray into both nostrils daily as needed for allergies or rhinitis.    . furosemide (LASIX) 20 MG tablet Take 1 tablet (20 mg total) by mouth as needed for fluid or edema. 15 tablet 3  . losartan (COZAAR) 25 MG tablet Take 0.5 tablets (12.5 mg  total) by mouth at bedtime. 45 tablet 3  . naphazoline-pheniramine (NAPHCON-A) 0.025-0.3 % ophthalmic solution Place 1 drop into both eyes daily as needed. For dry eyes    . Rivaroxaban (XARELTO) 15 MG TABS tablet Take 1 tablet (15 mg total) by mouth daily with supper. 30 tablet 11  . zolpidem (AMBIEN) 5 MG tablet Take 5 mg by mouth at bedtime.      No current facility-administered medications for this encounter.     Allergies  Allergen Reactions  . Alendronate Sodium Other (See Comments)    Arm  pain  . Amiodarone Nausea Only  . Ciprofloxacin     Cipro= not effective  . Coreg [Carvedilol]     Fatigue   . Metoprolol     Profound fatigue    Social History   Socioeconomic History  . Marital status: Widowed    Spouse name: Not on file  . Number of children: Not on file  . Years of education: Not on file  . Highest education level: Not on file  Social Needs  . Financial resource strain: Not on file  . Food insecurity - worry: Not on file  . Food insecurity - inability: Not on file  . Transportation needs - medical: Not on file  . Transportation needs - non-medical: Not on file  Occupational History  . Not on file  Tobacco Use  . Smoking status: Never Smoker  . Smokeless tobacco: Never Used  Substance and Sexual Activity  . Alcohol use: Yes    Alcohol/week: 1.8 oz    Types: 3 Glasses of wine per week    Comment: per week  . Drug use: No  . Sexual activity: Not on file  Other Topics Concern  . Not on file  Social History Narrative  . Not on file    Family History  Problem Relation Age of Onset  . Lung cancer Mother   . Heart attack Father     ROS- All systems are reviewed and negative except as per the HPI above  Physical Exam: Vitals:   08/22/17 1505  BP: 122/74  Pulse: (!) 131  Weight: 146 lb 3.2 oz (66.3 kg)  Height: 5\' 5"  (1.651 m)   Wt Readings from Last 3 Encounters:  08/22/17 146 lb 3.2 oz (66.3 kg)  08/13/17 144 lb (65.3 kg)  07/25/17 147 lb 6.4 oz (66.9 kg)    Labs: Lab Results  Component Value Date   NA 136 06/26/2017   K 4.2 06/26/2017   CL 105 06/26/2017   CO2 22 06/26/2017   GLUCOSE 119 (H) 06/26/2017   BUN 19 06/26/2017   CREATININE 0.94 06/26/2017   CALCIUM 8.8 (L) 06/26/2017   MG 1.9 06/26/2017   Lab Results  Component Value Date   INR 1.25 04/24/2016   No results found for: CHOL, HDL, LDLCALC, TRIG   GEN- The patient is well appearing, alert and oriented x 3 today.   Head- normocephalic, atraumatic Eyes-  Sclera  clear, conjunctiva pink Ears- hearing intact Oropharynx- clear Neck- supple, no JVP Lymph- no cervical lymphadenopathy Lungs- Clear to ausculation bilaterally, normal work of breathing Heart- irregular rate and rhythm, no murmurs, rubs or gallops, PMI not laterally displaced GI- soft, NT, ND, + BS Extremities- no clubbing, cyanosis, or edema MS- no significant deformity or atrophy Skin- no rash or lesion Psych- euthymic mood, full affect Neuro- strength and sensation are intact  EKG- Afib at 131 bpm, qrs int 138 ms, qtc 534 ms, Nonspecific IVB  Epic records reviewed, including Dr. Roxy Manns and Dr. Jackalyn Lombard note    Assessment and Plan: 1. Persistent afib Recently decided by Dr. Rayann Heman that she is not the best candidate for ablation Pt is not ready to commit to MVR/maze at this point She is intolerant of many rate control drugs and has failed amiodarone due to nausea Discussed Tikosyn but she lives alone and has 2 cats and is hesitatate to be away from home For now will try to increase bisoprolol  to 5 mg bid if BP stays above 100 and see if this will encourage SR She will call back to office tomorrow to let us know status She would like to entertain another cardioversion if afib persists.   Geroge Baseman Seger Jani, [facility] [address] [place], [place] [address] [phone]

## 2017-08-23 NOTE — H&P (View-Only) (Signed)
Primary Care Physician: Lavone Orn, MD Referring Physician: [facility] ER f/u Cardiologist: Dr. Aundra Dubin EP: Dr. Manus Gunning P Caffee is a 82 y.o. female with a h/o paroxysmal afib that is becoming more persistent. She recently saw Dr. Rayann Heman and he thought her long range ability to stay in SR was poor. He did not feel she was a good ablation candidate. She has not tolerated metoprolol, cardizem or amiodarone in  the past.  He referred her to Dr. Roxy Manns to discuss MVR and MAZE.  She at this point has decided not to do this. She called the afib clinic this am wanting an urgent appointment as she went into afib at 4:30 this am. She continues in afib with RVR although she now feels better than earlier today. Chadsvasc score is 5 and pt is on xarelto and has been taking consistently.  She has chronic systolic heart failure followed by Dr. Aundra Dubin. Her  weight is stable. She is not a candidate for other antiarrythmic's due to h/o long qt.  Today, she denies symptoms of palpitations, chest pain, shortness of breath, orthopnea, PND, lower extremity edema, dizziness, presyncope, syncope, or neurologic sequela. The patient is tolerating medications without difficulties and is otherwise without complaint today.   Past Medical History:  Diagnosis Date  . Arthritis   . Chronic combined systolic (congestive) and diastolic (congestive) heart failure ([place])   . Chronic systolic (congestive) heart failure ([place])   . Dysrhythmia    pvc's per pt  . Enlarged heart   . Mitral regurgitation   . Non-ischemic cardiomyopathy ([place])   . Paroxysmal A-fib ([place])   . Paroxysmal atrial fibrillation ([facility])   . Persistent atrial fibrillation ([place])   . PONV (postoperative nausea and vomiting)    Past Surgical History:  Procedure Laterality Date  . BUNIONECTOMY    . CARDIAC CATHETERIZATION     in 2004 at [facility]. "Insignificant blockage" per patient  . CARDIAC CATHETERIZATION N/A 04/25/2016   Procedure: Left Heart Cath  and Coronary Angiography;  Surgeon: Nelva Bush, MD;  Location: [place] CV LAB;  Service: Cardiovascular;  Laterality: N/A;  . CARDIOVERSION N/A 06/29/2017   Procedure: CARDIOVERSION;  Surgeon: Larey Dresser, MD;  Location: [facility] ENDOSCOPY;  Service: Cardiovascular;  Laterality: N/A;  . SHOULDER SURGERY     closed reduction  . TEE WITHOUT CARDIOVERSION N/A 06/29/2017   Procedure: TRANSESOPHAGEAL ECHOCARDIOGRAM (TEE);  Surgeon: Larey Dresser, MD;  Location: [facility] ENDOSCOPY;  Service: Cardiovascular;  Laterality: N/A;  . TONSILLECTOMY    . TOTAL HIP ARTHROPLASTY Left 03/18/2013   Procedure: TOTAL HIP ARTHROPLASTY ANTERIOR APPROACH;  Surgeon: Mauri Pole, MD;  Location: [facility] ORS;  Service: Orthopedics;  Laterality: Left;  . TUBAL LIGATION      Current Outpatient Medications  Medication Sig Dispense Refill  . acetaminophen (TYLENOL) 500 MG tablet Take 500 mg by mouth every 6 (six) hours as needed for moderate pain.    . bisoprolol (ZEBETA) 5 MG tablet Take 0.5 tablets (2.5 mg total) by mouth 2 (two) times daily. 30 tablet 11  . Docusate Calcium (STOOL SOFTENER PO) Take 1 capsule daily by mouth.    . fluticasone (FLONASE) 50 MCG/ACT nasal spray Place 1 spray into both nostrils daily as needed for allergies or rhinitis.    . furosemide (LASIX) 20 MG tablet Take 1 tablet (20 mg total) by mouth as needed for fluid or edema. 15 tablet 3  . losartan (COZAAR) 25 MG tablet Take 0.5 tablets (12.5 mg  total) by mouth at bedtime. 45 tablet 3  . naphazoline-pheniramine (NAPHCON-A) 0.025-0.3 % ophthalmic solution Place 1 drop into both eyes daily as needed. For dry eyes    . Rivaroxaban (XARELTO) 15 MG TABS tablet Take 1 tablet (15 mg total) by mouth daily with supper. 30 tablet 11  . zolpidem (AMBIEN) 5 MG tablet Take 5 mg by mouth at bedtime.      No current facility-administered medications for this encounter.     Allergies  Allergen Reactions  . Alendronate Sodium Other (See Comments)    Arm  pain  . Amiodarone Nausea Only  . Ciprofloxacin     Cipro= not effective  . Coreg [Carvedilol]     Fatigue   . Metoprolol     Profound fatigue    Social History   Socioeconomic History  . Marital status: Widowed    Spouse name: Not on file  . Number of children: Not on file  . Years of education: Not on file  . Highest education level: Not on file  Social Needs  . Financial resource strain: Not on file  . Food insecurity - worry: Not on file  . Food insecurity - inability: Not on file  . Transportation needs - medical: Not on file  . Transportation needs - non-medical: Not on file  Occupational History  . Not on file  Tobacco Use  . Smoking status: Never Smoker  . Smokeless tobacco: Never Used  Substance and Sexual Activity  . Alcohol use: Yes    Alcohol/week: 1.8 oz    Types: 3 Glasses of wine per week    Comment: per week  . Drug use: No  . Sexual activity: Not on file  Other Topics Concern  . Not on file  Social History Narrative  . Not on file    Family History  Problem Relation Age of Onset  . Lung cancer Mother   . Heart attack Father     ROS- All systems are reviewed and negative except as per the HPI above  Physical Exam: Vitals:   08/22/17 1505  BP: 122/74  Pulse: (!) 131  Weight: 146 lb 3.2 oz (66.3 kg)  Height: 5\' 5"  (1.651 m)   Wt Readings from Last 3 Encounters:  08/22/17 146 lb 3.2 oz (66.3 kg)  08/13/17 144 lb (65.3 kg)  07/25/17 147 lb 6.4 oz (66.9 kg)    Labs: Lab Results  Component Value Date   NA 136 06/26/2017   K 4.2 06/26/2017   CL 105 06/26/2017   CO2 22 06/26/2017   GLUCOSE 119 (H) 06/26/2017   BUN 19 06/26/2017   CREATININE 0.94 06/26/2017   CALCIUM 8.8 (L) 06/26/2017   MG 1.9 06/26/2017   Lab Results  Component Value Date   INR 1.25 04/24/2016   No results found for: CHOL, HDL, LDLCALC, TRIG   GEN- The patient is well appearing, alert and oriented x 3 today.   Head- normocephalic, atraumatic Eyes-  Sclera  clear, conjunctiva pink Ears- hearing intact Oropharynx- clear Neck- supple, no JVP Lymph- no cervical lymphadenopathy Lungs- Clear to ausculation bilaterally, normal work of breathing Heart- irregular rate and rhythm, no murmurs, rubs or gallops, PMI not laterally displaced GI- soft, NT, ND, + BS Extremities- no clubbing, cyanosis, or edema MS- no significant deformity or atrophy Skin- no rash or lesion Psych- euthymic mood, full affect Neuro- strength and sensation are intact  EKG- Afib at 131 bpm, qrs int 138 ms, qtc 534 ms, Nonspecific IVB  Epic records reviewed, including Dr. Roxy Manns and Dr. Jackalyn Lombard note    Assessment and Plan: 1. Persistent afib Recently decided by Dr. Rayann Heman that she is not the best candidate for ablation Pt is not ready to commit to MVR/maze at this point She is intolerant of many rate control drugs and has failed amiodarone due to nausea Discussed Tikosyn but she lives alone and has 2 cats and is hesitatate to be away from home For now will try to increase bisoprolol  to 5 mg bid if BP stays above 100 and see if this will encourage SR She will call back to office tomorrow to let us know status She would like to entertain another cardioversion if afib persists.   Geroge Baseman Jasson Siegmann, [facility] [address] [place], [place] [address] [phone]

## 2017-08-23 NOTE — Telephone Encounter (Signed)
Pt reports since having increased the bisoprolol, her rate is lower but she has no energy and has been in bed most of the day. Her HR now is 104; Per Roderic Palau, NP Pt should go back to 1/2 tablet bid of bisoprolol and we will call to schedule dccv for her next week then call back with the details.  Pt understood

## 2017-08-28 ENCOUNTER — Ambulatory Visit (HOSPITAL_COMMUNITY): Payer: PPO | Admitting: Anesthesiology

## 2017-08-28 ENCOUNTER — Ambulatory Visit (HOSPITAL_COMMUNITY)
Admission: RE | Admit: 2017-08-28 | Discharge: 2017-08-28 | Disposition: A | Discharge status: Home / Self Care | Payer: PPO | Source: Ambulatory Visit | Attending: Nurse Practitioner | Admitting: Nurse Practitioner

## 2017-08-28 ENCOUNTER — Encounter (HOSPITAL_COMMUNITY)
Admission: RE | Disposition: A | Discharge status: Home / Self Care | Payer: Self-pay | Source: Ambulatory Visit | Attending: Cardiology

## 2017-08-28 ENCOUNTER — Other Ambulatory Visit: Payer: Self-pay

## 2017-08-28 ENCOUNTER — Ambulatory Visit (HOSPITAL_COMMUNITY)
Admission: RE | Admit: 2017-08-28 | Discharge: 2017-08-28 | Disposition: A | Discharge status: Home / Self Care | Payer: PPO | Source: Ambulatory Visit | Attending: Cardiology | Admitting: Cardiology

## 2017-08-28 ENCOUNTER — Encounter (HOSPITAL_COMMUNITY): Payer: Self-pay | Admitting: Emergency Medicine

## 2017-08-28 DIAGNOSIS — I340 Nonrheumatic mitral (valve) insufficiency: Secondary | ICD-10-CM | POA: Diagnosis not present

## 2017-08-28 DIAGNOSIS — Z888 Allergy status to other drugs, medicaments and biological substances status: Secondary | ICD-10-CM | POA: Diagnosis not present

## 2017-08-28 DIAGNOSIS — I110 Hypertensive heart disease with heart failure: Secondary | ICD-10-CM | POA: Diagnosis not present

## 2017-08-28 DIAGNOSIS — M1990 Unspecified osteoarthritis, unspecified site: Secondary | ICD-10-CM | POA: Diagnosis not present

## 2017-08-28 DIAGNOSIS — I429 Cardiomyopathy, unspecified: Secondary | ICD-10-CM | POA: Insufficient documentation

## 2017-08-28 DIAGNOSIS — Z7901 Long term (current) use of anticoagulants: Secondary | ICD-10-CM | POA: Diagnosis not present

## 2017-08-28 DIAGNOSIS — I493 Ventricular premature depolarization: Secondary | ICD-10-CM | POA: Insufficient documentation

## 2017-08-28 DIAGNOSIS — I4891 Unspecified atrial fibrillation: Secondary | ICD-10-CM | POA: Diagnosis not present

## 2017-08-28 DIAGNOSIS — I480 Paroxysmal atrial fibrillation: Secondary | ICD-10-CM | POA: Insufficient documentation

## 2017-08-28 DIAGNOSIS — Z96642 Presence of left artificial hip joint: Secondary | ICD-10-CM | POA: Diagnosis not present

## 2017-08-28 DIAGNOSIS — Z881 Allergy status to other antibiotic agents status: Secondary | ICD-10-CM | POA: Diagnosis not present

## 2017-08-28 DIAGNOSIS — I5042 Chronic combined systolic (congestive) and diastolic (congestive) heart failure: Secondary | ICD-10-CM | POA: Diagnosis not present

## 2017-08-28 DIAGNOSIS — Z79899 Other long term (current) drug therapy: Secondary | ICD-10-CM | POA: Insufficient documentation

## 2017-08-28 DIAGNOSIS — H04123 Dry eye syndrome of bilateral lacrimal glands: Secondary | ICD-10-CM | POA: Diagnosis not present

## 2017-08-28 DIAGNOSIS — Z8249 Family history of ischemic heart disease and other diseases of the circulatory system: Secondary | ICD-10-CM | POA: Insufficient documentation

## 2017-08-28 DIAGNOSIS — E785 Hyperlipidemia, unspecified: Secondary | ICD-10-CM | POA: Diagnosis not present

## 2017-08-28 DIAGNOSIS — I481 Persistent atrial fibrillation: Secondary | ICD-10-CM | POA: Insufficient documentation

## 2017-08-28 DIAGNOSIS — I5043 Acute on chronic combined systolic (congestive) and diastolic (congestive) heart failure: Secondary | ICD-10-CM | POA: Diagnosis not present

## 2017-08-28 HISTORY — PX: CARDIOVERSION: SHX1299

## 2017-08-28 LAB — BASIC METABOLIC PANEL
Anion gap: 9 (ref 5–15)
BUN: 24 mg/dL — ABNORMAL HIGH (ref 6–20)
CO2: 23 mmol/L (ref 22–32)
Calcium: 9 mg/dL (ref 8.9–10.3)
Chloride: 106 mmol/L (ref 101–111)
Creatinine, Ser: 0.95 mg/dL (ref 0.44–1.00)
GFR calc Af Amer: >60 mL/min (ref >60)
GFR calc non Af Amer: 55 mL/min — ABNORMAL LOW (ref >60)
Glucose, Bld: 115 mg/dL — ABNORMAL HIGH (ref 65–99)
Potassium: 4.5 mmol/L (ref 3.5–5.1)
Sodium: 138 mmol/L (ref 135–145)

## 2017-08-28 LAB — CBC
HCT: 43.4 % (ref 36.0–46.0)
Hemoglobin: 15.1 g/dL — ABNORMAL HIGH (ref 12.0–15.0)
MCH: 31.9 pg (ref 26.0–34.0)
MCHC: 34.8 g/dL (ref 30.0–36.0)
MCV: 91.8 fL (ref 78.0–100.0)
Platelets: 224 10*3/uL (ref 150–400)
RBC: 4.73 MIL/uL (ref 3.87–5.11)
RDW: 13.4 % (ref 11.5–15.5)
WBC: 6.5 10*3/uL (ref 4.0–10.5)

## 2017-08-28 SURGERY — CARDIOVERSION
Anesthesia: General

## 2017-08-28 MED ORDER — PROPOFOL 10 MG/ML IV BOLUS
INTRAVENOUS | Status: DC | PRN
  Administered 2017-08-28: 30 mg via INTRAVENOUS
  Administered 2017-08-28: 50 mg via INTRAVENOUS

## 2017-08-28 MED ORDER — SODIUM CHLORIDE 0.9 % IV SOLN
INTRAVENOUS | Status: DC | PRN
  Administered 2017-08-28: 11:00:00 via INTRAVENOUS

## 2017-08-28 MED ORDER — LIDOCAINE 2% (20 MG/ML) 5 ML SYRINGE
INTRAMUSCULAR | Status: DC | PRN
  Administered 2017-08-28: 60 mg via INTRAVENOUS

## 2017-08-28 MED ORDER — PHENYLEPHRINE 40 MCG/ML (10ML) SYRINGE FOR IV PUSH (FOR BLOOD PRESSURE SUPPORT)
PREFILLED_SYRINGE | INTRAVENOUS | Status: DC | PRN
  Administered 2017-08-28 (×2): 80 ug via INTRAVENOUS

## 2017-08-28 NOTE — Interval H&P Note (Signed)
History and Physical Interval Note:  08/28/2017 11:28 AM  Cheryl Vargas  has presented today for surgery, with the diagnosis of A-FIB  The various methods of treatment have been discussed with the patient and family. After consideration of risks, benefits and other options for treatment, the patient has consented to  Procedure(s): CARDIOVERSION (N/A) as a surgical intervention .  The patient's history has been reviewed, patient examined, no change in status, stable for surgery.  I have reviewed the patient's chart and labs.  Questions were answered to the patient's satisfaction.     Rony Ratz [employer]

## 2017-08-28 NOTE — Transfer of Care (Signed)
Immediate Anesthesia Transfer of Care Note  Patient: Cheryl Vargas  Procedure(s) Performed: CARDIOVERSION (N/A )  Patient Location: Endoscopy Unit  Anesthesia Type:General  Level of Consciousness: drowsy  Airway & Oxygen Therapy: Patient Spontanous Breathing and Patient connected to face mask oxygen  Post-op Assessment: Report given to RN and Post -op Vital signs reviewed and stable  Post vital signs: Reviewed and stable  Last Vitals:  Vitals:   08/28/17 0945  Pulse: (!) 132  Resp: 12  Temp: (!) 36.4 C  SpO2: 98%    Last Pain:  Vitals:   08/28/17 0945  TempSrc: Oral         Complications: No apparent anesthesia complications

## 2017-08-28 NOTE — Addendum Note (Signed)
Addendum  created 08/28/17 1311 by Orlie Dakin, CRNA   Child order released for a procedure order, Intraprocedure Blocks edited, Sign clinical note

## 2017-08-28 NOTE — Anesthesia Procedure Notes (Signed)
Procedure Name: General with mask airway Date/Time: 08/28/2017 11:23 AM Performed by: Orlie Dakin, CRNA Pre-anesthesia Checklist: Patient identified, Emergency Drugs available, Suction available, Patient being monitored and Timeout performed Patient Re-evaluated:Patient Re-evaluated prior to induction Oxygen Delivery Method: Ambu bag Preoxygenation: Pre-oxygenation with 100% oxygen Induction Type: IV induction

## 2017-08-28 NOTE — Discharge Instructions (Signed)
Electrical Cardioversion, Care After °This sheet gives you information about how to care for yourself after your procedure. Your health care provider may also give you more specific instructions. If you have problems or questions, contact your health care provider. °What can I expect after the procedure? °After the procedure, it is common to have: °· Some redness on the skin where the shocks were given. ° °Follow these instructions at home: °· Do not drive for 24 hours if you were given a medicine to help you relax (sedative). °· Take over-the-counter and prescription medicines only as told by your health care provider. °· Ask your health care provider how to check your pulse. Check it often. °· Rest for 48 hours after the procedure or as told by your health care provider. °· Avoid or limit your caffeine use as told by your health care provider. °Contact a health care provider if: °· You feel like your heart is beating too quickly or your pulse is not regular. °· You have a serious muscle cramp that does not go away. °Get help right away if: °· You have discomfort in your chest. °· You are dizzy or you feel faint. °· You have trouble breathing or you are short of breath. °· Your speech is slurred. °· You have trouble moving an arm or leg on one side of your body. °· Your fingers or toes turn cold or blue. °This information is not intended to replace advice given to you by your health care provider. Make sure you discuss any questions you have with your health care provider. °Document Released: 06/18/2013 Document Revised: 03/31/2016 Document Reviewed: 03/03/2016 °Elsevier Interactive Patient Education © 2018 Elsevier Inc. ° °

## 2017-08-28 NOTE — Anesthesia Postprocedure Evaluation (Signed)
Anesthesia Post Note  Patient: Cheryl Vargas  Procedure(s) Performed: CARDIOVERSION (N/A )     Patient location during evaluation: PACU Anesthesia Type: General Level of consciousness: awake and alert Pain management: pain level controlled Vital Signs Assessment: post-procedure vital signs reviewed and stable Respiratory status: spontaneous breathing, nonlabored ventilation and respiratory function stable Cardiovascular status: blood pressure returned to baseline and stable Postop Assessment: no apparent nausea or vomiting Anesthetic complications: no    Last Vitals:  Vitals:   08/28/17 1150 08/28/17 1200  BP: (!) 109/45 (!) 108/55  Pulse: (!) 55 (!) 58  Resp: 13 15  Temp:    SpO2: 100% 100%    Last Pain:  Vitals:   08/28/17 1133  TempSrc: Oral                 Tramain Gershman,W. EDMOND

## 2017-08-28 NOTE — Anesthesia Preprocedure Evaluation (Signed)
Anesthesia Evaluation  Patient identified by MRN, date of birth, ID band Patient awake    Reviewed: Allergy & Precautions, H&P , NPO status , Patient's Chart, lab work & pertinent test results  History of Anesthesia Complications (+) PONV  Airway Mallampati: I  TM Distance: >3 FB Neck ROM: Full    Dental no notable dental hx. (+) Teeth Intact, Dental Advisory Given   Pulmonary neg pulmonary ROS,    Pulmonary exam normal breath sounds clear to auscultation       Cardiovascular hypertension, +CHF  + dysrhythmias Atrial Fibrillation  Rhythm:Irregular Rate:Normal     Neuro/Psych negative neurological ROS  negative psych ROS   GI/Hepatic negative GI ROS, Neg liver ROS,   Endo/Other  negative endocrine ROS  Renal/GU negative Renal ROS  negative genitourinary   Musculoskeletal  (+) Arthritis , Osteoarthritis,    Abdominal   Peds  Hematology negative hematology ROS (+)   Anesthesia Other Findings   Reproductive/Obstetrics negative OB ROS                             Anesthesia Physical Anesthesia Plan  ASA: III  Anesthesia Plan: General   Post-op Pain Management:    Induction: Intravenous  PONV Risk Score and Plan: 4 or greater and Treatment may vary due to age or medical condition  Airway Management Planned: Mask  Additional Equipment:   Intra-op Plan:   Post-operative Plan:   Informed Consent: I have reviewed the patients History and Physical, chart, labs and discussed the procedure including the risks, benefits and alternatives for the proposed anesthesia with the patient or authorized representative who has indicated his/her understanding and acceptance.   Dental advisory given  Plan Discussed with: CRNA  Anesthesia Plan Comments:         Anesthesia Quick Evaluation

## 2017-08-28 NOTE — Procedures (Signed)
Electrical Cardioversion Procedure Note CICI RODRIGES [id number] 1934-10-12  Procedure: Electrical Cardioversion Indications:  Atrial Fibrillation  Procedure Details Consent: Risks of procedure as well as the alternatives and risks of each were explained to the (patient/caregiver).  Consent for procedure obtained. Time Out: Verified patient identification, verified procedure, site/side was marked, verified correct patient position, special equipment/implants available, medications/allergies/relevent history reviewed, required imaging and test results available.  Performed  Patient placed on cardiac monitor, pulse oximetry, supplemental oxygen as necessary.  Sedation given: Propofol per anesthesiology Pacer pads placed anterior and posterior chest.  Cardioverted 1 time(s).  Cardioverted at [place].  Evaluation Findings: Post procedure EKG shows: NSR Complications: None Patient did tolerate procedure well.  Will need to consider Tikosyn admission.    [name] 08/28/2017, 11:28 AM

## 2017-08-29 ENCOUNTER — Telehealth (HOSPITAL_COMMUNITY): Payer: Self-pay | Admitting: *Deleted

## 2017-08-29 ENCOUNTER — Telehealth: Payer: Self-pay | Admitting: Pharmacist

## 2017-08-29 NOTE — Telephone Encounter (Signed)
Medication list reviewed in anticipation of upcoming Tikosyn initiation. Patient is not taking any contraindicated or QTc prolonging medications. Amiodarone last taken in 2017. Last Mag was low in October of this year - may need supplementation prior to admission.   Patient is anticoagulated on Xarelto on the appropriate dose. Please ensure that patient has not missed any anticoagulation doses in the 3 weeks prior to Tikosyn initiation.   Patient will need to be counseled to avoid use of Benadryl while on Tikosyn and in the 2-3 days prior to Tikosyn initiation.

## 2017-08-29 NOTE — Telephone Encounter (Signed)
Per Dr. Aundra Dubin - recommending admission for [facility] and talked with patient regarding this. Pt slightly hesitate due to costs of hospital stay and drug cost (pt will check on the cost with her insurance co) but if it will keep her in NSR she will think about it. Pt has follow up with Dr. Aundra Dubin after Christmas will inform of decision at that time. Instructed on no missed doses of xarelto. Will call back if further questions.

## 2017-08-31 ENCOUNTER — Telehealth (HOSPITAL_COMMUNITY): Payer: Self-pay | Admitting: *Deleted

## 2017-08-31 NOTE — Telephone Encounter (Signed)
Patient called in stating she went to the grocery store this morning felt great - went back to the store this afternoon around 2 and barely made it back to her car from the store before feeling "like she was going to die" , broke out in a sweat and just short of breath. Pt states it took about 5-10 mins for her to gather herself and drive home. Once home she checked her HR and it was irregular and 115. Pt states she feels some better but unsure of what to do. Instructed pt if her SBP is over 100 she could take an 1/2 tab of bisoprolol now and take her normal dosing of 2.5mg  tonight before bed.  Pt is pending possible tikosyn admission after the first of the year shes leaning toward this option depending on costs. Pt was very nervous on the phone and ER precautions were reviewed but patient states she does not want to go to the hospital with the holidays coming up. Encouraged pt to use on-call physician over weekend if she runs into trouble. Pt very appreciative of advice and felt better about taking the extra bisoprolol since she knew what blood pressure to look for.

## 2017-09-03 ENCOUNTER — Emergency Department (HOSPITAL_COMMUNITY): Payer: PPO

## 2017-09-03 ENCOUNTER — Inpatient Hospital Stay (HOSPITAL_COMMUNITY)
Admission: EM | Admit: 2017-09-03 | Discharge: 2017-09-09 | DRG: 226 | Disposition: A | Discharge status: Home Health | Payer: PPO | Attending: Internal Medicine | Admitting: Internal Medicine

## 2017-09-03 DIAGNOSIS — I480 Paroxysmal atrial fibrillation: Secondary | ICD-10-CM | POA: Diagnosis not present

## 2017-09-03 DIAGNOSIS — Z959 Presence of cardiac and vascular implant and graft, unspecified: Secondary | ICD-10-CM | POA: Diagnosis not present

## 2017-09-03 DIAGNOSIS — Z8249 Family history of ischemic heart disease and other diseases of the circulatory system: Secondary | ICD-10-CM | POA: Diagnosis not present

## 2017-09-03 DIAGNOSIS — J811 Chronic pulmonary edema: Secondary | ICD-10-CM

## 2017-09-03 DIAGNOSIS — I469 Cardiac arrest, cause unspecified: Secondary | ICD-10-CM | POA: Diagnosis not present

## 2017-09-03 DIAGNOSIS — I442 Atrioventricular block, complete: Secondary | ICD-10-CM | POA: Diagnosis present

## 2017-09-03 DIAGNOSIS — Z7951 Long term (current) use of inhaled steroids: Secondary | ICD-10-CM | POA: Diagnosis not present

## 2017-09-03 DIAGNOSIS — I5043 Acute on chronic combined systolic (congestive) and diastolic (congestive) heart failure: Secondary | ICD-10-CM | POA: Diagnosis not present

## 2017-09-03 DIAGNOSIS — I4891 Unspecified atrial fibrillation: Secondary | ICD-10-CM | POA: Diagnosis not present

## 2017-09-03 DIAGNOSIS — J9601 Acute respiratory failure with hypoxia: Secondary | ICD-10-CM | POA: Diagnosis not present

## 2017-09-03 DIAGNOSIS — J810 Acute pulmonary edema: Secondary | ICD-10-CM

## 2017-09-03 DIAGNOSIS — Z96642 Presence of left artificial hip joint: Secondary | ICD-10-CM | POA: Diagnosis not present

## 2017-09-03 DIAGNOSIS — I340 Nonrheumatic mitral (valve) insufficiency: Secondary | ICD-10-CM

## 2017-09-03 DIAGNOSIS — I472 Ventricular tachycardia, unspecified: Secondary | ICD-10-CM

## 2017-09-03 DIAGNOSIS — I482 Chronic atrial fibrillation: Secondary | ICD-10-CM | POA: Diagnosis not present

## 2017-09-03 DIAGNOSIS — I5041 Acute combined systolic (congestive) and diastolic (congestive) heart failure: Secondary | ICD-10-CM

## 2017-09-03 DIAGNOSIS — I4819 Other persistent atrial fibrillation: Secondary | ICD-10-CM

## 2017-09-03 DIAGNOSIS — E876 Hypokalemia: Secondary | ICD-10-CM | POA: Diagnosis not present

## 2017-09-03 DIAGNOSIS — E872 Acidosis: Secondary | ICD-10-CM | POA: Diagnosis present

## 2017-09-03 DIAGNOSIS — R000 Tachycardia, unspecified: Secondary | ICD-10-CM | POA: Diagnosis not present

## 2017-09-03 DIAGNOSIS — J90 Pleural effusion, not elsewhere classified: Secondary | ICD-10-CM | POA: Diagnosis not present

## 2017-09-03 DIAGNOSIS — I481 Persistent atrial fibrillation: Secondary | ICD-10-CM | POA: Diagnosis present

## 2017-09-03 DIAGNOSIS — I429 Cardiomyopathy, unspecified: Secondary | ICD-10-CM | POA: Diagnosis not present

## 2017-09-03 DIAGNOSIS — R918 Other nonspecific abnormal finding of lung field: Secondary | ICD-10-CM | POA: Diagnosis not present

## 2017-09-03 DIAGNOSIS — I4821 Permanent atrial fibrillation: Secondary | ICD-10-CM | POA: Diagnosis present

## 2017-09-03 DIAGNOSIS — I509 Heart failure, unspecified: Secondary | ICD-10-CM | POA: Diagnosis not present

## 2017-09-03 DIAGNOSIS — Z7901 Long term (current) use of anticoagulants: Secondary | ICD-10-CM

## 2017-09-03 DIAGNOSIS — R112 Nausea with vomiting, unspecified: Secondary | ICD-10-CM | POA: Diagnosis not present

## 2017-09-03 DIAGNOSIS — G9340 Encephalopathy, unspecified: Secondary | ICD-10-CM | POA: Diagnosis not present

## 2017-09-03 LAB — I-STAT CHEM 8, ED
BUN: 27 mg/dL — ABNORMAL HIGH (ref 6–20)
Calcium, Ion: 1.07 mmol/L — ABNORMAL LOW (ref 1.15–1.40)
Chloride: 109 mmol/L (ref 101–111)
Creatinine, Ser: 0.9 mg/dL (ref 0.44–1.00)
Glucose, Bld: 169 mg/dL — ABNORMAL HIGH (ref 65–99)
HCT: 42 % (ref 36.0–46.0)
Hemoglobin: 14.3 g/dL (ref 12.0–15.0)
Potassium: 4.9 mmol/L (ref 3.5–5.1)
Sodium: 142 mmol/L (ref 135–145)
TCO2: 21 mmol/L — ABNORMAL LOW (ref 22–32)

## 2017-09-03 LAB — BASIC METABOLIC PANEL
Anion gap: 7 (ref 5–15)
BUN: 19 mg/dL (ref 6–20)
CO2: 17 mmol/L — ABNORMAL LOW (ref 22–32)
Calcium: 7.7 mg/dL — ABNORMAL LOW (ref 8.9–10.3)
Chloride: 112 mmol/L — ABNORMAL HIGH (ref 101–111)
Creatinine, Ser: 0.98 mg/dL (ref 0.44–1.00)
GFR calc Af Amer: >60 mL/min (ref >60)
GFR calc non Af Amer: 53 mL/min — ABNORMAL LOW (ref >60)
Glucose, Bld: 164 mg/dL — ABNORMAL HIGH (ref 65–99)
Potassium: 4.8 mmol/L (ref 3.5–5.1)
Sodium: 136 mmol/L (ref 135–145)

## 2017-09-03 LAB — CBC WITH DIFFERENTIAL/PLATELET
Basophils Absolute: 0 10*3/uL (ref 0.0–0.1)
Basophils Relative: 0 %
Eosinophils Absolute: 0.2 10*3/uL (ref 0.0–0.7)
Eosinophils Relative: 2 %
HCT: 41.6 % (ref 36.0–46.0)
Hemoglobin: 14.2 g/dL (ref 12.0–15.0)
Lymphocytes Relative: 35 %
Lymphs Abs: 2.7 10*3/uL (ref 0.7–4.0)
MCH: 31.6 pg (ref 26.0–34.0)
MCHC: 34.1 g/dL (ref 30.0–36.0)
MCV: 92.4 fL (ref 78.0–100.0)
Monocytes Absolute: 0.8 10*3/uL (ref 0.1–1.0)
Monocytes Relative: 11 %
Neutro Abs: 4 10*3/uL (ref 1.7–7.7)
Neutrophils Relative %: 52 %
Platelets: 177 10*3/uL (ref 150–400)
RBC: 4.5 MIL/uL (ref 3.87–5.11)
RDW: 13.7 % (ref 11.5–15.5)
WBC: 7.8 10*3/uL (ref 4.0–10.5)

## 2017-09-03 LAB — I-STAT TROPONIN, ED: Troponin i, poc: 0.03 ng/mL (ref 0.00–0.08)

## 2017-09-03 LAB — I-STAT ARTERIAL BLOOD GAS, ED
Acid-base deficit: 11 mmol/L — ABNORMAL HIGH (ref 0.0–2.0)
Bicarbonate: 13.8 mmol/L — ABNORMAL LOW (ref 20.0–28.0)
O2 Saturation: 83 %
Patient temperature: 97.6
TCO2: 15 mmol/L — ABNORMAL LOW (ref 22–32)
pCO2 arterial: 27.8 mmHg — ABNORMAL LOW (ref 32.0–48.0)
pH, Arterial: 7.302 — ABNORMAL LOW (ref 7.350–7.450)
pO2, Arterial: 49 mmHg — ABNORMAL LOW (ref 83.0–108.0)

## 2017-09-03 LAB — APTT: aPTT: 29 seconds (ref 24–36)

## 2017-09-03 LAB — GLUCOSE, CAPILLARY: Glucose-Capillary: 175 mg/dL — ABNORMAL HIGH (ref 65–99)

## 2017-09-03 LAB — I-STAT CG4 LACTIC ACID, ED: Lactic Acid, Venous: 4.21 mmol/L (ref 0.5–1.9)

## 2017-09-03 LAB — MAGNESIUM: Magnesium: 2 mg/dL (ref 1.7–2.4)

## 2017-09-03 LAB — HEPARIN LEVEL (UNFRACTIONATED): Heparin Unfractionated: 1.06 IU/mL — ABNORMAL HIGH (ref 0.30–0.70)

## 2017-09-03 LAB — LACTIC ACID, PLASMA: Lactic Acid, Venous: 4.6 mmol/L (ref 0.5–1.9)

## 2017-09-03 MED ORDER — AMIODARONE HCL IN DEXTROSE 360-4.14 MG/200ML-% IV SOLN: 60.0000 mg/h | INTRAVENOUS | Status: DC

## 2017-09-03 MED ORDER — ONDANSETRON HCL 4 MG/2ML IJ SOLN
4.0000 mg | Freq: Once | INTRAMUSCULAR | Status: AC
  Administered 2017-09-03: 4 mg via INTRAVENOUS
  Filled 2017-09-03: qty 2

## 2017-09-03 MED ORDER — MIDAZOLAM HCL 2 MG/2ML IJ SOLN
INTRAMUSCULAR | Status: AC | PRN
  Administered 2017-09-03: 1 mg via INTRAVENOUS

## 2017-09-03 MED ORDER — MAGNESIUM SULFATE 50 % IJ SOLN
INTRAMUSCULAR | Status: DC | PRN
  Administered 2017-09-03: 2 g via INTRAVENOUS

## 2017-09-03 MED ORDER — AMIODARONE HCL IN DEXTROSE 360-4.14 MG/200ML-% IV SOLN
30.0000 mg/h | INTRAVENOUS | Status: DC
Start: 2017-09-04 — End: 2017-09-03

## 2017-09-03 MED ORDER — PROPOFOL 10 MG/ML IV BOLUS
INTRAVENOUS | Status: AC
  Filled 2017-09-03: qty 20

## 2017-09-03 MED ORDER — INSULIN ASPART 100 UNIT/ML ~~LOC~~ SOLN
2.0000 [IU] | SUBCUTANEOUS | Status: DC
  Administered 2017-09-03: 4 [IU] via SUBCUTANEOUS
  Administered 2017-09-04 – 2017-09-05 (×4): 2 [IU] via SUBCUTANEOUS

## 2017-09-03 MED ORDER — AMIODARONE HCL IN DEXTROSE 360-4.14 MG/200ML-% IV SOLN
60.0000 mg/h | Freq: Once | INTRAVENOUS | Status: AC
  Administered 2017-09-03: 60 mg/h via INTRAVENOUS

## 2017-09-03 MED ORDER — ONDANSETRON HCL 4 MG/2ML IJ SOLN
4.0000 mg | Freq: Four times a day (QID) | INTRAMUSCULAR | Status: DC | PRN
  Administered 2017-09-03 – 2017-09-07 (×4): 4 mg via INTRAVENOUS
  Filled 2017-09-03 (×5): qty 2

## 2017-09-03 MED ORDER — ADENOSINE 6 MG/2ML IV SOLN
INTRAVENOUS | Status: AC
  Filled 2017-09-03: qty 6

## 2017-09-03 MED ORDER — FUROSEMIDE 10 MG/ML IJ SOLN
40.0000 mg | Freq: Once | INTRAMUSCULAR | Status: AC
  Administered 2017-09-03: 40 mg via INTRAVENOUS
  Filled 2017-09-03: qty 4

## 2017-09-03 MED ORDER — HEPARIN (PORCINE) IN NACL 100-0.45 UNIT/ML-% IJ SOLN
800.0000 [IU]/h | INTRAMUSCULAR | Status: DC
Start: 2017-09-03 — End: 2017-09-07
  Administered 2017-09-03: 900 [IU]/h via INTRAVENOUS
  Administered 2017-09-05: 700 [IU]/h via INTRAVENOUS
  Administered 2017-09-06: 800 [IU]/h via INTRAVENOUS
  Filled 2017-09-03 (×3): qty 250

## 2017-09-03 MED ORDER — TRIMETHOBENZAMIDE HCL 100 MG/ML IM SOLN
200.0000 mg | Freq: Once | INTRAMUSCULAR | Status: AC
  Administered 2017-09-04: 200 mg via INTRAMUSCULAR
  Filled 2017-09-03: qty 2

## 2017-09-03 MED ORDER — MIDAZOLAM HCL 2 MG/2ML IJ SOLN
INTRAMUSCULAR | Status: AC
  Filled 2017-09-03: qty 2

## 2017-09-03 MED ORDER — FENTANYL CITRATE (PF) 100 MCG/2ML IJ SOLN
50.0000 ug | INTRAMUSCULAR | Status: DC | PRN
  Administered 2017-09-03 (×2): 50 ug via INTRAVENOUS
  Filled 2017-09-03 (×2): qty 2

## 2017-09-03 MED ORDER — DEXTROSE 5 % IV SOLN
INTRAVENOUS | Status: DC | PRN
  Administered 2017-09-03: 300 mg via INTRAVENOUS

## 2017-09-03 MED ORDER — FENTANYL CITRATE (PF) 100 MCG/2ML IJ SOLN
12.5000 ug | INTRAMUSCULAR | Status: DC | PRN
  Administered 2017-09-03 – 2017-09-06 (×3): 12.5 ug via INTRAVENOUS
  Filled 2017-09-03 (×3): qty 2

## 2017-09-03 MED ORDER — AMIODARONE HCL IN DEXTROSE 360-4.14 MG/200ML-% IV SOLN
30.0000 mg/h | INTRAVENOUS | Status: DC
  Administered 2017-09-04: 30 mg/h via INTRAVENOUS
  Filled 2017-09-03: qty 200

## 2017-09-03 MED ORDER — SODIUM CHLORIDE 0.9 % IV SOLN: 250.0000 mL | INTRAVENOUS | Status: DC | PRN

## 2017-09-03 MED ORDER — PROPOFOL 10 MG/ML IV BOLUS
INTRAVENOUS | Status: AC | PRN
  Administered 2017-09-03: 50 mg via INTRAVENOUS

## 2017-09-03 MED ORDER — ADENOSINE 6 MG/2ML IV SOLN
INTRAVENOUS | Status: DC | PRN
  Administered 2017-09-03: 6 mg via INTRAVENOUS
  Administered 2017-09-03: 12 mg via INTRAVENOUS

## 2017-09-03 NOTE — ED Notes (Addendum)
Shock administered. V Tach noted. Pulse 244. Patient unresponsive.

## 2017-09-03 NOTE — H&P (Signed)
PULMONARY / CRITICAL CARE MEDICINE   Name: Cheryl Vargas MRN: [id number] DOB: 08/23/1935    ADMISSION DATE:  09/03/2017 CONSULTATION DATE:  09/03/2017  REFERRING MD:  Dr. Jeneen Rinks   CHIEF COMPLAINT:  A.Fib RVR   HISTORY OF PRESENT ILLNESS:   82 year old female with PMH of Combined Systolic/Diastolic HF, P. Afib on Xarelto s/p cardioversion last week with failed attempts with antiarrhythmics on Zebeta  Reported to ED on 12/24 with weakness and dyspnea. HR 242, A.fib vs Vtach. Adenosine administered x 2 without relief. Cardioverted. First attempt was not successful and patient went into pulseless VT. Second attempt successful. Loaded with amiodarone. Placed on Bipap for hypoxia. CXR with pulmonary edema. Given 40 meq lasix. Cardiology consulted. PCCM asked to admit.   PAST MEDICAL HISTORY :  She  has a past medical history of Arthritis, Chronic combined systolic (congestive) and diastolic (congestive) heart failure (HCC), Chronic systolic (congestive) heart failure ([place]), Dysrhythmia, Enlarged heart, Mitral regurgitation, Non-ischemic cardiomyopathy ([place]), Paroxysmal A-fib ([place]), Paroxysmal atrial fibrillation ([place]), Persistent atrial fibrillation ([place]), and PONV (postoperative nausea and vomiting).  PAST SURGICAL HISTORY: She  has a past surgical history that includes Tonsillectomy; Tubal ligation; Shoulder surgery; Bunionectomy; Cardiac catheterization; Total hip arthroplasty (Left, 03/18/2013); Cardiac catheterization (N/A, 04/25/2016); Cardioversion (N/A, 06/29/2017); TEE without cardioversion (N/A, 06/29/2017); and Cardioversion (N/A, 08/28/2017).  Allergies  Allergen Reactions  . Alendronate Sodium Other (See Comments)    Arm pain  . Amiodarone Nausea Only  . Ciprofloxacin Other (See Comments)    Not effective  . Coreg [Carvedilol] Other (See Comments)    Fatigue   . Metoprolol Other (See Comments)    Profound fatigue    No current facility-administered medications on file prior  to encounter.    Current Outpatient Medications on File Prior to Encounter  Medication Sig  . acetaminophen (TYLENOL) 500 MG tablet Take 500 mg by mouth every 6 (six) hours as needed for moderate pain.  . bisoprolol (ZEBETA) 5 MG tablet Take 0.5 tablets (2.5 mg total) by mouth 2 (two) times daily.  Marland Kitchen docusate sodium (COLACE) 100 MG capsule Take 100 mg by mouth daily as needed for mild constipation.  . fluticasone (FLONASE) 50 MCG/ACT nasal spray Place 1 spray into both nostrils daily as needed for allergies or rhinitis.  . furosemide (LASIX) 20 MG tablet Take 1 tablet (20 mg total) by mouth as needed for fluid or edema. (Patient taking differently: Take 20 mg by mouth daily as needed for fluid or edema. )  . losartan (COZAAR) 25 MG tablet Take 0.5 tablets (12.5 mg total) by mouth at bedtime.  . naphazoline-pheniramine (NAPHCON-A) 0.025-0.3 % ophthalmic solution Place 1 drop into both eyes daily as needed (dry eyes).   . Rivaroxaban (XARELTO) 15 MG TABS tablet Take 1 tablet (15 mg total) by mouth daily with supper.  . zolpidem (AMBIEN) 5 MG tablet Take 5 mg by mouth at bedtime.     FAMILY HISTORY:  Her indicated that her mother is deceased. She indicated that the status of her father is unknown.   SOCIAL HISTORY: She  reports that  has never smoked. she has never used smokeless tobacco. She reports that she drinks about 1.8 oz of alcohol per week. She reports that she does not use drugs.  REVIEW OF SYSTEMS:   All negative; except for those that are bolded, which indicate positives.  Constitutional: weight loss, weight gain, night sweats, fevers, chills, fatigue, weakness.  HEENT: headaches, sore throat, sneezing, nasal congestion, post nasal  drip, difficulty swallowing, tooth/dental problems, visual complaints, visual changes, ear aches. Neuro: difficulty with speech, weakness, numbness, ataxia. CV:  chest pain, orthopnea, PND, swelling in lower extremities, dizziness, palpitations,  syncope.  Resp: cough, hemoptysis, dyspnea, wheezing. GI: heartburn, indigestion, abdominal pain, nausea, vomiting, diarrhea, constipation, change in bowel habits, loss of appetite, hematemesis, melena, hematochezia.  GU: dysuria, change in color of urine, urgency or frequency, flank pain, hematuria. MSK: joint pain or swelling, decreased range of motion. Psych: change in mood or affect, depression, anxiety, suicidal ideations, homicidal ideations. Skin: rash, itching, bruising.   SUBJECTIVE:  Feels nauseated, currently on BiPAP  VITAL SIGNS: BP 132/84   Pulse (!) 111   Resp 15   SpO2 93%   HEMODYNAMICS:    VENTILATOR SETTINGS: FiO2 (%):  [100 %] 100 %  INTAKE / OUTPUT: No intake/output data recorded.  PHYSICAL EXAMINATION: General:  Elderly female on Bipap, no distress  Neuro:  Alert, oriented, follows commands  HEENT:  MMM Cardiovascular:  Irregular, rate 110-120, no MRG  Lungs:  Crackles to bases, no wheeze, non-labored  Abdomen:  Non-distended, active bowel sounds Musculoskeletal:  -edema  Skin:  Warm, dry, intact   LABS:  BMET Recent Labs  Lab 08/28/17 0916 09/03/17 1709 09/03/17 1711  NA 138 136 142  K 4.5 4.8 4.9  CL 106 112* 109  CO2 23 17*  --   BUN 24* 19 27*  CREATININE 0.95 0.98 0.90  GLUCOSE 115* 164* 169*    Electrolytes Recent Labs  Lab 08/28/17 0916 09/03/17 1709  CALCIUM 9.0 7.7*  MG  --  2.0    CBC Recent Labs  Lab 08/28/17 0916 09/03/17 1709 09/03/17 1711  WBC 6.5 7.8  --   HGB 15.1* 14.2 14.3  HCT 43.4 41.6 42.0  PLT 224 177  --     Coag's No results for input(s): APTT, INR in the last 168 hours.  Sepsis Markers Recent Labs  Lab 09/03/17 1711 09/03/17 1757  LATICACIDVEN 4.21* 4.6*    ABG Recent Labs  Lab 09/03/17 1754  PHART 7.302*  PCO2ART 27.8*  PO2ART 49.0*    Liver Enzymes No results for input(s): AST, ALT, ALKPHOS, BILITOT, ALBUMIN in the last 168 hours.  Cardiac Enzymes No results for  input(s): TROPONINI, PROBNP in the last 168 hours.  Glucose No results for input(s): GLUCAP in the last 168 hours.  Imaging Dg Chest Portable 1 View  Result Date: 09/03/2017 CLINICAL DATA:  Status post CPR. EXAM: PORTABLE CHEST 1 VIEW COMPARISON:  10/18/2015 FINDINGS: There is vascular congestion with hazy airspace lung opacity noted in the right mid and lower lung and at the left lung base. No gross pneumothorax on this supine exam. Cardiac silhouette is normal in size. No mediastinal or hilar masses. Skeletal structures are demineralized but grossly intact. IMPRESSION: 1. Vascular congestion with hazy lung opacities, right greater than left, consistent with asymmetric pulmonary edema. Electronically Signed   By: Lajean Manes M.D.   On: 09/03/2017 17:50     STUDIES:  CXR 12/24 > Vascular congestion with hazy lung opacities, right greater than left, consistent with asymmetric pulmonary edema  CULTURES: None.   ANTIBIOTICS: None.   SIGNIFICANT EVENTS: 12/24 > Presents to ED   LINES/TUBES: PIV   DISCUSSION: 82 year old female presents with A.Fib RVR vs VT. Cardioverted. During cardioversion went into pulseless VT for 3-5 minutes. After arrest alert and oriented on BiPAP  ASSESSMENT / PLAN:  PULMONARY A: Acute Hypoxic Respiratory Failure in setting of  pulmonary edema  P:   BiPAP PRN  Maintain Oxygenation > 92 Trend CXR  CARDIOVASCULAR A:  Brief Pulseless VT arrest 3-5 minutes s/p cardioversion  Recurring A.Fib RVR s/p Multiple Cardioversion > Followed  outpatient by EP Dr. Rayann Heman and Cardiology by [facility], considering ablation vs Tikosyn  Severe MR  Combined Systolic/Diastolic HF (TEE on 19/16 EF 20-25)   P:  Cardiac Monitoring  Cardiology Following  ECHO pending  Continue Amiodarone gtt  Heparin Gtt (in place of Xarelto, last dose 12/23 1700) Given Lasix 40 meq x 1 in ED  Cardiology with plans to increase/restart Beta-blocker home dose in AM >  advised to leave on  Amiodarone overnight    RENAL A:   Lactic Acidosis in setting of cardiac arrest , hypoperfusion LA 4.21 > 4.6  P:   Trend BMP Replace electrolytes as indicated  Trend LA   GASTROINTESTINAL A:   Nausea (known reaction to amiodarone)  P:   NPO > Can Advance diet in AM if status improves   HEMATOLOGIC A:   Chronic Anticoagulation as above  P:  Trend CBC  Trend INR  Heparin as above   INFECTIOUS A:   No issues  P:   Trend WBC and Fever Curve   ENDOCRINE A:   No issues    P:   Trend Glucose   NEUROLOGIC A:   No issues, fully intact s/p arrest  P:   Monitor    FAMILY  - Updates: Family at patient updated at bedside. Patient okay with short-term intubated and CPR if needed   - Inter-disciplinary family meet or Palliative Care meeting due by: 09/10/2017    CC Time: 57 minutes   Hayden Pedro, AGACNP-BC [place]  Pgr: [phone]  PCCM Pgr: [phone]

## 2017-09-03 NOTE — ED Notes (Signed)
No change in rate or rhythm following administration of Adenosine 12 mg. Patient AOx4. Pulse 240s. Atrial fib.

## 2017-09-03 NOTE — ED Notes (Addendum)
Patient arrived to ED via [facility] from home. EMS reports:  Patient c/o elevated HR, and feeling weak x 2 days. Patient had been cardioverted last Tuesday while at Endoscopy here at [facility]. Pulse in 240s.  Patient AOx4. BP 76/46, Pulse 240, 99% on 4 LPM via nasal cannula. CBG 104.

## 2017-09-03 NOTE — ED Notes (Signed)
MD at bedside. Respiratory at bedside.

## 2017-09-03 NOTE — ED Notes (Signed)
Shock administered 

## 2017-09-03 NOTE — ED Notes (Signed)
No change in rate or rhythm following administration of Adenosine 6 mg.

## 2017-09-03 NOTE — Telephone Encounter (Signed)
I cld to check on patient this morning and she reports that she is feeling much better.  She has had "some arrhythmias" but not had any major afib spells.  She was thankful for our call to check on her.

## 2017-09-03 NOTE — ED Notes (Signed)
CPR initiated

## 2017-09-03 NOTE — ED Notes (Signed)
Pulse palpated. + radial pulse. No further need for CPR.

## 2017-09-03 NOTE — Progress Notes (Signed)
ANTICOAGULATION CONSULT NOTE - Initial Consult  Pharmacy Consult for heparin Indication: atrial fibrillation  Allergies  Allergen Reactions  . Alendronate Sodium Other (See Comments)    Arm pain  . Amiodarone Nausea Only  . Ciprofloxacin Other (See Comments)    Not effective  . Coreg [Carvedilol] Other (See Comments)    Fatigue   . Metoprolol Other (See Comments)    Profound fatigue    Patient Measurements:   Heparin Dosing Weight: 64.4  Vital Signs: BP: 128/92 (12/24 2100) Pulse Rate: 107 (12/24 2100)  Labs: Recent Labs    09/03/17 1709 09/03/17 1711  HGB 14.2 14.3  HCT 41.6 42.0  PLT 177  --   CREATININE 0.98 0.90    Estimated Creatinine Clearance: 44.9 mL/min (by C-G formula based on SCr of 0.9 mg/dL).   Medical History: Past Medical History:  Diagnosis Date  . Arthritis   . Chronic combined systolic (congestive) and diastolic (congestive) heart failure ([place])   . Chronic systolic (congestive) heart failure ([place])   . Dysrhythmia    pvc's per pt  . Enlarged heart   . Mitral regurgitation   . Non-ischemic cardiomyopathy ([place])   . Paroxysmal A-fib ([place])   . Paroxysmal atrial fibrillation (HCC)   . Persistent atrial fibrillation ([place])   . PONV (postoperative nausea and vomiting)     Medications:  Infusions:  . sodium chloride    . amiodarone (NEXTERONE) IV bolus only 150 mg/100 mL    . amiodarone    . [START ON 09/04/2017] amiodarone    . heparin      Assessment: 69 YOF with history of Afib s/p cardioversion week ago, Conversion in ED not successful 12/24 and received CPR - rib fractures.  Now being consulted for heparin for Afib, takes Xarelto PTA with last dose 12/23 around 1700.    Goal of Therapy:  Heparin level 0.3-0.7 units/ml aPTT 66-102 seconds Monitor platelets by anticoagulation protocol: Yes   Plan:  Start heparin gtt at 900 units/hr without bolus d/t recent CPR/trauma  F/u baseline aPTT/heparin level Monitor daily CBC, heparin  level, s/s bleeding  Carnella Guadalajara 09/03/2017,9:13 PM

## 2017-09-03 NOTE — ED Provider Notes (Addendum)
Patient seen and evaluated with resident. Patient recently cardioverted from A. fib. Presents today in rapid wide complex rhythm at 250.  Has faintly palpable femoral pulse. He is awake but weak. Complaining of shortness of breath. Not clinically in congestive heart failure.  Patient is anticoagulated.  Clear indication for cardioversion. While preparing for cardioversion patient given 6, then 12 mg of IV Adenocard without change in or pause in rhythm. Patient given 1 mg Versed, 50 mg propofol. Underwent cigarettes Cardioversion at 200 J. Had deterioration to a ventricular tachycardia. Initially with pulses than without. Underwent cardioversion. This  was unsuccessful. CPR initiated. Given epinephrine 1 mg, amiodarone 300 mg. Then repeat cardioversion successful to A. fib with pulse. Increasing pressures. She slowly becomes more awake and alert.  Has progressive hypoxemia. No obvious rib fractures on chest x-ray. Given fluids. Given pain control. On BiPAP. Discussed the case with Dr. Gypsy Balsam of cardiology. His planning evaluation request ICU admission. Call placed intensive care service.CRITICAL CARE Performed by: Lolita Patella   Total critical care time: 60 minutes  Critical care time was exclusive of separately billable procedures and treating other patients.  Critical care was necessary to treat or prevent imminent or life-threatening deterioration.  Critical care was time spent personally by me on the following activities: development of treatment plan with patient and/or surrogate as well as nursing, discussions with consultants, evaluation of patient's response to treatment, examination of patient, obtaining history from patient or surrogate, ordering and performing treatments and interventions, ordering and review of laboratory studies, ordering and review of radiographic studies, pulse oximetry and re-evaluation of patient's condition.      Tanna Furry, MD 09/03/17 Terrina Blonder     Tanna Furry, MD 09/03/17 [phone]

## 2017-09-03 NOTE — Progress Notes (Signed)
Pt taken off BIPIP and placed on 5lt SALTER.[place]rier. Pt  has nausea zophran given.

## 2017-09-03 NOTE — ED Notes (Signed)
CPR resumed 

## 2017-09-03 NOTE — ED Notes (Signed)
Patient responsive. Follows commands.

## 2017-09-03 NOTE — Progress Notes (Signed)
Patient placed on V60 Bipap 12/6 100% per MD. Patient states settings and mask are comfortable. Family at bedside. RN aware.

## 2017-09-03 NOTE — Progress Notes (Signed)
   09/03/17 1700  Clinical Encounter Type  Visited With Family;Health care provider  Visit Type Initial;Psychological support;Spiritual support;Social support;ED  Referral From Nurse  Spiritual Encounters  Spiritual Needs Emotional  Stress Factors  Patient Stress Factors None identified  Family Stress Factors Loss of control   Location: D [address] Name: [place], [place] of Visit: CPR   Chaplain was called on to support family whom was unaware that their loved one (pt.) was receiving unexpected CPR. CPR was successful. When chaplain arrived Pt was breathing on her own. Chaplain gave family members (Daughter and Son) some water as they waited for doctor to update them. Chaplain remains nearby.   Dante Gang, Chaplain

## 2017-09-03 NOTE — ED Notes (Signed)
Shock administered. No pulse palpated. CPR resumed.

## 2017-09-03 NOTE — Consult Note (Signed)
Cardiology Consultation:   Patient ID: Cheryl Vargas; [id number]; 1935-02-12   Admit date: 09/03/2017 Date of Consult: 09/03/2017  Primary Care Provider: Lavone Orn, MD Primary Cardiologist:  Allred Primary Electrophysiologist:  Allred   Patient Profile:   Cheryl Vargas is a 82 y.o. female with a hx of atrial fibrillation status post cardioversion last week who is being seen today for the evaluation of wide-complex tachycardia requiring defibrillation at the request of Dr. Jeneen Rinks.  History of Present Illness:   Cheryl Vargas 82 year old female with history of persistent atrial fibrillation, previously failed antiarrhythmics on Zebeta currently who just underwent cardioversion last Tuesday and has been feeling poorly since then, who came into the emergency room with heart rate of 242 bpm feeling weak, tired, exhausted.  She reported no fevers, no syncope.  Once here in the emergency room, adenosine was administered x2 without relief of tachycardia.  The decision was made then to convert her because of hemodynamic instability.  The first conversion was not successful, CPR ensued.  Second conversion then was successful and she shifted from a fast wide-complex tachycardia at 242 down to atrial fibrillation at 110 range.  During the CPR, she developed rib fractures.  She currently is on BiPAP with her son and daughter in the room.  She is able to answer some questions through the BiPAP mask.  She underwent cardiac catheterization in 2017 that showed no CAD. She underwent cardioversion on 08/28/17-successful conversion from atrial fibrillation to normal sinus rhythm.  She is consistently been taking her Xarelto.  She also has an EF of 40-45% followed by Dr. Aundra Dubin.  She has not been a candidate for antiarrhythmics because of prolonged QT interval according to notes reviewed from [facility] visit.  Dr. Rayann Heman also thought that she was not the best candidate for ablation.  She was not ready  to commit to mitral valve replacement/maze.  She is failed amiodarone due to nausea.  Tikosyn was discussed but she was hesitant because she lives alone and has 2 cats and is hesitant to be away from home.  Bisoprolol was trialed at 5 mg twice daily.  On current med list post cardioversion it has been listed out as 2.5 mg twice a day.    Past Medical History:  Diagnosis Date  . Arthritis   . Chronic combined systolic (congestive) and diastolic (congestive) heart failure ([place])   . Chronic systolic (congestive) heart failure ([place])   . Dysrhythmia    pvc's per pt  . Enlarged heart   . Mitral regurgitation   . Non-ischemic cardiomyopathy ([place])   . Paroxysmal A-fib ([place])   . Paroxysmal atrial fibrillation ([facility])   . Persistent atrial fibrillation ([place])   . PONV (postoperative nausea and vomiting)     Past Surgical History:  Procedure Laterality Date  . BUNIONECTOMY    . CARDIAC CATHETERIZATION     in 2004 at [facility]. "Insignificant blockage" per patient  . CARDIAC CATHETERIZATION N/A 04/25/2016   Procedure: Left Heart Cath and Coronary Angiography;  Surgeon: Nelva Bush, MD;  Location: [facility];  Service: Cardiovascular;  Laterality: N/A;  . CARDIOVERSION N/A 06/29/2017   Procedure: CARDIOVERSION;  Surgeon: Larey Dresser, MD;  Location: [facility] ENDOSCOPY;  Service: Cardiovascular;  Laterality: N/A;  . CARDIOVERSION N/A 08/28/2017   Procedure: CARDIOVERSION;  Surgeon: Larey Dresser, MD;  Location: [facility] ENDOSCOPY;  Service: Cardiovascular;  Laterality: N/A;  . SHOULDER SURGERY     closed reduction  . TEE WITHOUT CARDIOVERSION N/A  06/29/2017   Procedure: TRANSESOPHAGEAL ECHOCARDIOGRAM (TEE);  Surgeon: Larey Dresser, MD;  Location: [facility] ENDOSCOPY;  Service: Cardiovascular;  Laterality: N/A;  . TONSILLECTOMY    . TOTAL HIP ARTHROPLASTY Left 03/18/2013   Procedure: TOTAL HIP ARTHROPLASTY ANTERIOR APPROACH;  Surgeon: Mauri Pole, MD;  Location: [facility] ORS;  Service: Orthopedics;   Laterality: Left;  . TUBAL LIGATION       Home Medications:  Prior to Admission medications   Medication Sig Start Date End Date Taking? Authorizing Provider  acetaminophen (TYLENOL) 500 MG tablet Take 500 mg by mouth every 6 (six) hours as needed for moderate pain.    [provider]  bisoprolol (ZEBETA) 5 MG tablet Take 0.5 tablets (2.5 mg total) by mouth 2 (two) times daily. 07/10/17   Larey Dresser, MD  docusate sodium (COLACE) 100 MG capsule Take 100 mg by mouth daily as needed for mild constipation.    [provider]  fluticasone (FLONASE) 50 MCG/ACT nasal spray Place 1 spray into both nostrils daily as needed for allergies or rhinitis.    [provider]  furosemide (LASIX) 20 MG tablet Take 1 tablet (20 mg total) by mouth as needed for fluid or edema. 03/05/17   Larey Dresser, MD  losartan (COZAAR) 25 MG tablet Take 0.5 tablets (12.5 mg total) by mouth at bedtime. 03/05/17   Larey Dresser, MD  naphazoline-pheniramine (NAPHCON-A) 0.025-0.3 % ophthalmic solution Place 1 drop into both eyes daily as needed. For dry eyes    [provider]  Rivaroxaban (XARELTO) 15 MG TABS tablet Take 1 tablet (15 mg total) by mouth daily with supper. 09/27/16   Larey Dresser, MD  zolpidem (AMBIEN) 5 MG tablet Take 5 mg by mouth at bedtime.     [provider]    Inpatient Medications: Scheduled Meds: . adenosine      . midazolam      . propofol       Continuous Infusions:  PRN Meds: fentaNYL (SUBLIMAZE) injection  Allergies:    Allergies  Allergen Reactions  . Alendronate Sodium Other (See Comments)    Arm pain  . Amiodarone Nausea Only  . Ciprofloxacin     Cipro= not effective  . Coreg [Carvedilol]     Fatigue   . Metoprolol     Profound fatigue    Social History:   Social History   Socioeconomic History  . Marital status: Widowed    Spouse name: Not on file  . Number of children: Not on file  . Years of education: Not on  file  . Highest education level: Not on file  Social Needs  . Financial resource strain: Not on file  . Food insecurity - worry: Not on file  . Food insecurity - inability: Not on file  . Transportation needs - medical: Not on file  . Transportation needs - non-medical: Not on file  Occupational History  . Not on file  Tobacco Use  . Smoking status: Never Smoker  . Smokeless tobacco: Never Used  Substance and Sexual Activity  . Alcohol use: Yes    Alcohol/week: 1.8 oz    Types: 3 Glasses of wine per week    Comment: per week  . Drug use: No  . Sexual activity: Not on file  Other Topics Concern  . Not on file  Social History Narrative  . Not on file    Family History:    Family History  Problem Relation Age of Onset  .  Lung cancer Mother   . Heart attack Father      ROS:  Please see the history of present illness.  ROS  All other ROS reviewed and negative.     Physical Exam/Data:   Vitals:   09/03/17 1650 09/03/17 1730 09/03/17 1745 09/03/17 1800  BP: 102/82  115/72 117/88  Pulse: (!) 246  (!) 114 (!) 105  Resp: (!) 32  18 (!) 25  SpO2: 99% (!) 88% 90% 94%   No intake or output data in the 24 hours ending 09/03/17 1806 There were no vitals filed for this visit. There is no height or weight on file to calculate BMI.  General: Ill-appearing, on BiPAP, able to answer some questions. HEENT: normal Lymph: no adenopathy Neck: no JVD Endocrine:  No thryomegaly Vascular: No carotid bruits  Cardiac:  normal S1, S2; irregularly irregular, mildly tachycardic; no murmur  Lungs:  clear to auscultation bilaterally, no wheezing, rhonchi or rales  Abd: soft, nontender, no hepatomegaly  Ext: no edema Musculoskeletal:  No deformities, BUE and BLE strength normal and equal Skin: warm and dry  Neuro:  CNs 2-12 intact, no focal abnormalities noted Psych:  Normal affect   EKG:  The EKG was personally reviewed and demonstrates: Multiple EKGs reviewed, initial EKG demonstrates  tachycardia, wide-complex upright QRS V1 through V6 going 242 bpm, subsequent EKGs demonstrate right bundle branch block with left anterior fascicular block heart rate in the 110 range atrial fibrillation  Telemetry:  Telemetry was personally reviewed and demonstrates: Atrial fibrillation  Relevant CV Studies: Cardiac catheterization 2017-no CAD  Echocardiogram 2018-EF 45%.  Laboratory Data:  Chemistry Recent Labs  Lab 08/28/17 0916 09/03/17 1709 09/03/17 1711  NA 138 136 142  K 4.5 4.8 4.9  CL 106 112* 109  CO2 23 17*  --   GLUCOSE 115* 164* 169*  BUN 24* 19 27*  CREATININE 0.95 0.98 0.90  CALCIUM 9.0 7.7*  --   GFRNONAA 55* 53*  --   GFRAA >60 >60  --   ANIONGAP 9 7  --     No results for input(s): PROT, ALBUMIN, AST, ALT, ALKPHOS, BILITOT in the last 168 hours. Hematology Recent Labs  Lab 08/28/17 0916 09/03/17 1709 09/03/17 1711  WBC 6.5 7.8  --   RBC 4.73 4.50  --   HGB 15.1* 14.2 14.3  HCT 43.4 41.6 42.0  MCV 91.8 92.4  --   MCH 31.9 31.6  --   MCHC 34.8 34.1  --   RDW 13.4 13.7  --   PLT 224 177  --    Cardiac EnzymesNo results for input(s): TROPONINI in the last 168 hours.  Recent Labs  Lab 09/03/17 1709  TROPIPOC 0.03    BNPNo results for input(s): BNP, PROBNP in the last 168 hours.  DDimer No results for input(s): DDIMER in the last 168 hours.  Radiology/Studies:  Dg Chest Portable 1 View  Result Date: 09/03/2017 CLINICAL DATA:  Status post CPR. EXAM: PORTABLE CHEST 1 VIEW COMPARISON:  10/18/2015 FINDINGS: There is vascular congestion with hazy airspace lung opacity noted in the right mid and lower lung and at the left lung base. No gross pneumothorax on this supine exam. Cardiac silhouette is normal in size. No mediastinal or hilar masses. Skeletal structures are demineralized but grossly intact. IMPRESSION: 1. Vascular congestion with hazy lung opacities, right greater than left, consistent with asymmetric pulmonary edema. Electronically Signed    By: Lajean Manes M.D.   On: 09/03/2017 17:50  Assessment and Plan:   82 year old female with persistent atrial fibrillation status post failed cardioversion recently here with extreme tachycardia heart rate 242, possible one-to-one flutter, did not respond to adenosine but did subsequently undergo defibrillations x2 and required brief episode of CPR due to hemodynamic deterioration.  There is been subsequent hypoxia and rib fractures.  Persistent atrial fibrillation/wide-complex tachycardia -Possible 1-1 atrial flutter versus extremely fast conduction through AV node of atrial fibrillation potentially.  Other possibilities include SVT or perhaps accessory pathway.  Ventricular tachycardia as well however she does have a right bundle branch block at baseline and V1 is upright, perhaps this was just abberancy  -Keep potassium greater than 4, magnesium greater than 2. -We will increase beta-blocker to improve AV blockade.  This seems to have been decreased post cardioversion to 2.5 mg twice a day. - Likely needs IV Lasix to assist with pulmonary edema.  EF 40%.  Acute deterioration in the setting of tachycardia likely took place. -Metabolic acidosis, lactic acid is increased. -Rib fracture per primary team. -Currently agree with IV amiodarone for stabilization overnight.  We will continue to closely follow along.  I also discussed EKGs with electrophysiology.  We will formally consult.    For questions or updates, please contact [facility] Please consult [email or web address] for contact info under Cardiology/STEMI.   Signed, Candee Furbish, MD  09/03/2017 6:06 PM

## 2017-09-03 NOTE — Progress Notes (Signed)
RT on standby for cardioversion. Patient lost pulse. CPR initiated and patient manually bagged with 100% O2. Once pulse and spontaneous breathing regained, patient placed on 100% nonrebreather. ABG obtained and reported to MD. Waiting for further instruction. RN at bedside

## 2017-09-03 NOTE — ED Notes (Signed)
Report attempted x 1

## 2017-09-03 NOTE — ED Notes (Signed)
Respiratory at bedside.

## 2017-09-03 NOTE — ED Provider Notes (Signed)
[facility] EMERGENCY DEPARTMENT Provider Note   CSN: [id number] Arrival date & time: 09/03/17  1625     History   Chief Complaint Chief Complaint  Patient presents with  . AFib w/RVR    HPI Cheryl Vargas is a 82 y.o. female.  The history is provided by the patient and medical records.  Illness  This is a new problem. Episode onset: felt ill for last 3days, but "knew" it was her Afib this morning. The problem occurs rarely. The problem has not changed since onset.Associated symptoms include chest pain. Pertinent negatives include no abdominal pain, no headaches and no shortness of breath. Nothing aggravates the symptoms. Nothing relieves the symptoms. She has tried nothing for the symptoms.    Past Medical History:  Diagnosis Date  . Arthritis   . Chronic combined systolic (congestive) and diastolic (congestive) heart failure ([place])   . Chronic systolic (congestive) heart failure ([place])   . Dysrhythmia    pvc's per pt  . Enlarged heart   . Mitral regurgitation   . Non-ischemic cardiomyopathy ([place])   . Paroxysmal A-fib ([place])   . Paroxysmal atrial fibrillation ([facility])   . Persistent atrial fibrillation ([place])   . PONV (postoperative nausea and vomiting)     Patient Active Problem List   Diagnosis Date Noted  . A-fib ([place]) 09/03/2017  . Mitral regurgitation   . Chronic systolic (congestive) heart failure ([place])   . Non-ischemic cardiomyopathy ([place])   . CHF (congestive heart failure), NYHA class IV ([place]) 04/24/2016  . Dizziness 12/01/2015  . Hypertension 12/01/2015  . Insomnia 12/01/2015  . Acute on chronic combined systolic (congestive) and diastolic (congestive) heart failure ([place]) 12/01/2015  . Paroxysmal atrial fibrillation (H[facility])   . Closed left hip fracture ([place]) 03/17/2013  . Cardiomegaly 03/17/2013  . PVC (premature ventricular contraction) 03/17/2013  . Hyperlipidemia 03/17/2013    Past Surgical History:  Procedure Laterality Date  .  BUNIONECTOMY    . CARDIAC CATHETERIZATION     in 2004 at [facility]. "Insignificant blockage" per patient  . CARDIAC CATHETERIZATION N/A 04/25/2016   Procedure: Left Heart Cath and Coronary Angiography;  Surgeon: Nelva Bush, MD;  Location: [facility];  Service: Cardiovascular;  Laterality: N/A;  . CARDIOVERSION N/A 06/29/2017   Procedure: CARDIOVERSION;  Surgeon: Larey Dresser, MD;  Location: [facility] ENDOSCOPY;  Service: Cardiovascular;  Laterality: N/A;  . CARDIOVERSION N/A 08/28/2017   Procedure: CARDIOVERSION;  Surgeon: Larey Dresser, MD;  Location: [facility] ENDOSCOPY;  Service: Cardiovascular;  Laterality: N/A;  . SHOULDER SURGERY     closed reduction  . TEE WITHOUT CARDIOVERSION N/A 06/29/2017   Procedure: TRANSESOPHAGEAL ECHOCARDIOGRAM (TEE);  Surgeon: Larey Dresser, MD;  Location: [facility] ENDOSCOPY;  Service: Cardiovascular;  Laterality: N/A;  . TONSILLECTOMY    . TOTAL HIP ARTHROPLASTY Left 03/18/2013   Procedure: TOTAL HIP ARTHROPLASTY ANTERIOR APPROACH;  Surgeon: Mauri Pole, MD;  Location: [facility] ORS;  Service: Orthopedics;  Laterality: Left;  . TUBAL LIGATION      OB History    No data available       Home Medications    Prior to Admission medications   Medication Sig Start Date End Date Taking? Authorizing Provider  acetaminophen (TYLENOL) 500 MG tablet Take 500 mg by mouth every 6 (six) hours as needed for moderate pain.   Yes [provider]  bisoprolol (ZEBETA) 5 MG tablet Take 0.5 tablets (2.5 mg total) by mouth 2 (two) times daily. 07/10/17  Yes Aundra Dubin,  Elby Showers, MD  docusate sodium (COLACE) 100 MG capsule Take 100 mg by mouth daily as needed for mild constipation.   Yes [provider]  fluticasone (FLONASE) 50 MCG/ACT nasal spray Place 1 spray into both nostrils daily as needed for allergies or rhinitis.   Yes [provider]  furosemide (LASIX) 20 MG tablet Take 1 tablet (20 mg total) by mouth as needed for fluid or edema. Patient  taking differently: Take 20 mg by mouth daily as needed for fluid or edema.  03/05/17  Yes Larey Dresser, MD  losartan (COZAAR) 25 MG tablet Take 0.5 tablets (12.5 mg total) by mouth at bedtime. 03/05/17  Yes Larey Dresser, MD  naphazoline-pheniramine (NAPHCON-A) 0.025-0.3 % ophthalmic solution Place 1 drop into both eyes daily as needed (dry eyes).    Yes [provider]  Rivaroxaban (XARELTO) 15 MG TABS tablet Take 1 tablet (15 mg total) by mouth daily with supper. 09/27/16  Yes Larey Dresser, MD  zolpidem (AMBIEN) 5 MG tablet Take 5 mg by mouth at bedtime.    Yes [provider]    Family History Family History  Problem Relation Age of Onset  . Lung cancer Mother   . Heart attack Father     Social History Social History   Tobacco Use  . Smoking status: Never Smoker  . Smokeless tobacco: Never Used  Substance Use Topics  . Alcohol use: Yes    Alcohol/week: 1.8 oz    Types: 3 Glasses of wine per week    Comment: per week  . Drug use: No     Allergies   Alendronate sodium; Amiodarone; Ciprofloxacin; Coreg [carvedilol]; and Metoprolol   Review of Systems Review of Systems  Constitutional: Positive for fatigue. Negative for chills and fever.  HENT: Negative for rhinorrhea and sore throat.   Eyes: Negative for visual disturbance.  Respiratory: Negative for cough and shortness of breath.   Cardiovascular: Positive for chest pain and palpitations.  Gastrointestinal: Positive for nausea. Negative for abdominal pain and vomiting.  Genitourinary: Negative for dysuria.  Musculoskeletal: Negative for arthralgias and back pain.  Skin: Negative for rash.  Neurological: Negative for syncope and headaches.  All other systems reviewed and are negative.    Physical Exam Updated Vital Signs BP 128/85   Pulse (!) 111   Resp 15   SpO2 93%   Physical Exam  Constitutional: She is oriented to person, place, and time. She appears well-developed and  well-nourished. No distress.  HENT:  Head: Normocephalic and atraumatic.  Eyes: Conjunctivae are normal. Pupils are equal, round, and reactive to light.  Neck: Neck supple.  Cardiovascular: Regular rhythm.  No murmur heard. Tachycardia  Pulmonary/Chest: Breath sounds normal. No respiratory distress.  Tachypnea  Abdominal: Soft. There is no tenderness.  Musculoskeletal: Normal range of motion. She exhibits no edema.  Neurological: She is alert and oriented to person, place, and time.  Skin: Skin is warm and dry.  Psychiatric: She has a normal mood and affect.  Nursing note and vitals reviewed.    ED Treatments / Results  Labs (all labs ordered are listed, but only abnormal results are displayed) Labs Reviewed  BASIC METABOLIC PANEL - Abnormal; Notable for the following components:      Result Value   Chloride 112 (*)    CO2 17 (*)    Glucose, Bld 164 (*)    Calcium 7.7 (*)    GFR calc non Af Amer 53 (*)  All other components within normal limits  LACTIC ACID, PLASMA - Abnormal; Notable for the following components:   Lactic Acid, Venous 4.6 (*)    All other components within normal limits  I-STAT CG4 LACTIC ACID, ED - Abnormal; Notable for the following components:   Lactic Acid, Venous 4.21 (*)    All other components within normal limits  I-STAT CHEM 8, ED - Abnormal; Notable for the following components:   BUN 27 (*)    Glucose, Bld 169 (*)    Calcium, Ion 1.07 (*)    TCO2 21 (*)    All other components within normal limits  I-STAT ARTERIAL BLOOD GAS, ED - Abnormal; Notable for the following components:   pH, Arterial 7.302 (*)    pCO2 arterial 27.8 (*)    pO2, Arterial 49.0 (*)    Bicarbonate 13.8 (*)    TCO2 15 (*)    Acid-base deficit 11.0 (*)    All other components within normal limits  CBC WITH DIFFERENTIAL/PLATELET  MAGNESIUM  BASIC METABOLIC PANEL  CBC  MAGNESIUM  PHOSPHORUS  BLOOD GAS, ARTERIAL  HEPARIN LEVEL (UNFRACTIONATED)  APTT  HEPARIN  LEVEL (UNFRACTIONATED)  APTT  I-STAT TROPONIN, ED    EKG  EKG Interpretation  Date/Time:  Monday September 03 2017 16:33:18 EST Ventricular Rate:  242 PR Interval:    QRS Duration: 107 QT Interval:  232 QTC Calculation: 466 R Axis:   -129 Text Interpretation:  Confirmed by Tanna Furry [phone]) on 09/03/2017 6:05:56 PM       Radiology Dg Chest Portable 1 View  Result Date: 09/03/2017 CLINICAL DATA:  Status post CPR. EXAM: PORTABLE CHEST 1 VIEW COMPARISON:  10/18/2015 FINDINGS: There is vascular congestion with hazy airspace lung opacity noted in the right mid and lower lung and at the left lung base. No gross pneumothorax on this supine exam. Cardiac silhouette is normal in size. No mediastinal or hilar masses. Skeletal structures are demineralized but grossly intact. IMPRESSION: 1. Vascular congestion with hazy lung opacities, right greater than left, consistent with asymmetric pulmonary edema. Electronically Signed   By: Lajean Manes M.D.   On: 09/03/2017 17:50    Procedures .Cardioversion Date/Time: 09/03/2017 9:59 PM Performed by: Jenny Reichmann, MD Authorized by: Tanna Furry, MD   Consent:    Consent obtained:  Verbal   Consent given by:  Patient   Risks discussed:  Induced arrhythmia and pain   Alternatives discussed:  No treatment Pre-procedure details:    Cardioversion basis:  Elective   Rhythm:  Atrial fibrillation   Electrode placement:  Anterior-posterior Patient sedated: Yes. Refer to sedation procedure documentation for details of sedation.  Attempt one:    Cardioversion mode:  Synchronous   Shock (Joules):  300   Shock outcome:  Conversion to ventricular tachycardia Post-procedure details:    Patient status:  Alert   Patient tolerance of procedure:  Tolerated with difficulty Comments:     Cardioversion resulted in VT, so ACLS protocol initiated     (including critical care time)  Medications Ordered in ED Medications  adenosine (ADENOCARD) 6  MG/2ML injection (not administered)  propofol (DIPRIVAN) 10 mg/mL bolus/IV push (not administered)  midazolam (VERSED) 2 MG/2ML injection (not administered)  amiodarone (CORDARONE) 300 mg in dextrose 5 % 100 mL bolus (300 mg Intravenous New Bag/Given 09/03/17 1658)  0.9 %  sodium chloride infusion (not administered)  fentaNYL (SUBLIMAZE) injection 12.5 mcg (not administered)  ondansetron (ZOFRAN) injection 4 mg (4 mg Intravenous Given 09/03/17 2111)  insulin  aspart (novoLOG) injection 2-6 Units (not administered)  amiodarone (NEXTERONE PREMIX) 360-4.14 MG/200ML-% (1.8 mg/mL) IV infusion (not administered)  amiodarone (NEXTERONE PREMIX) 360-4.14 MG/200ML-% (1.8 mg/mL) IV infusion (not administered)  adenosine (ADENOCARD) 6 MG/2ML injection (12 mg Intravenous Given 09/03/17 1643)  heparin ADULT infusion 100 units/mL (25000 units/283mL sodium chloride 0.45%) (not administered)  amiodarone (NEXTERONE PREMIX) 360-4.14 MG/200ML-% (1.8 mg/mL) IV infusion (60 mg/hr Intravenous New Bag/Given 09/03/17 1741)  ondansetron (ZOFRAN) injection 4 mg (4 mg Intravenous Given 09/03/17 1758)  ondansetron (ZOFRAN) injection 4 mg (4 mg Intravenous Given 09/03/17 1932)  furosemide (LASIX) injection 40 mg (40 mg Intravenous Given 09/03/17 2027)  midazolam (VERSED) injection (1 mg Intravenous Given 09/03/17 1650)  propofol (DIPRIVAN) 10 mg/mL bolus/IV push (50 mg Intravenous Given 09/03/17 1654)     Initial Impression / Assessment and Plan / ED Course  I have reviewed the triage vital signs and the nursing notes.  Pertinent labs & imaging results that were available during my care of the patient were reviewed by me and considered in my medical decision making (see chart for details).     Pt with h/o CHF, Afib (w/prior cardioversion), RBBB presents with tachycardia. Says she hasn't felt well for the last several days, but didn't "know" she was in RVR until this morning. Called EMS for transportation to the  hospital this evening; medics were only minutes away so did not administer any medications. Pt takes daily Xarelto.  VS & exam as above. EKG w/SVT vs Afib vs Aflutter @ 24bpm. Attempted chemical cardioversion w/6mg  adenosine which failed; repeat attempt w/12mg  also failed.  Discussed electrical cardioversion w/the Pt at bedside & she agreed to the procedure saying she has had it done previously.  Sedated with versed and propofol & synchronized cardioversion attempted with 300J. After the shock, the Pt complained of the pain from the shock and slowly became unconscious as the rhythm on the monitor organized to VT. She lost her pulse, CPR was started, and the Pt was ultimately defibrillated twice more, synchronized cardioverted once more, given 1mg  epinephrine & 300mg  amiodarone before achieving ROSC. The Pt began to wake up and was talking to [place], but complained of difficulty breathing, so was placed on [place], then NRB, and ultimately BiPAP.  CXR after ROSC shows vascular congestion w/hazy opacities R>L, but no fracture or PTX.  Cardiology consulted and evaluated the Pt in the ED; recommendations noted.  Intensivist consulted and evaluated the Pt in the ED; they will admit the Pt to the ICU for further evaluation and treatment.  Final Clinical Impressions(s) / ED Diagnoses   Final diagnoses:  Persistent atrial fibrillation ([place])  Ventricular tachycardia ([place])  Acute pulmonary edema [facility])    ED Discharge Orders    None       Jenny Reichmann, MD 09/03/17 2202    Tanna Furry, MD 09/03/17 2324

## 2017-09-04 ENCOUNTER — Inpatient Hospital Stay (HOSPITAL_COMMUNITY): Payer: PPO

## 2017-09-04 ENCOUNTER — Other Ambulatory Visit: Payer: Self-pay

## 2017-09-04 ENCOUNTER — Encounter (HOSPITAL_COMMUNITY): Payer: Self-pay

## 2017-09-04 DIAGNOSIS — I481 Persistent atrial fibrillation: Secondary | ICD-10-CM

## 2017-09-04 DIAGNOSIS — I340 Nonrheumatic mitral (valve) insufficiency: Secondary | ICD-10-CM

## 2017-09-04 DIAGNOSIS — J810 Acute pulmonary edema: Secondary | ICD-10-CM

## 2017-09-04 LAB — GLUCOSE, CAPILLARY
Glucose-Capillary: 148 mg/dL — ABNORMAL HIGH (ref 65–99)
Glucose-Capillary: 140 mg/dL — ABNORMAL HIGH (ref 65–99)
Glucose-Capillary: 130 mg/dL — ABNORMAL HIGH (ref 65–99)
Glucose-Capillary: 113 mg/dL — ABNORMAL HIGH (ref 65–99)
Glucose-Capillary: 147 mg/dL — ABNORMAL HIGH (ref 65–99)
Glucose-Capillary: 108 mg/dL — ABNORMAL HIGH (ref 65–99)

## 2017-09-04 LAB — ECHOCARDIOGRAM COMPLETE
Height: 65 in
Weight: 2479.73 oz

## 2017-09-04 LAB — LACTIC ACID, PLASMA: Lactic Acid, Venous: 3.7 mmol/L (ref 0.5–1.9)

## 2017-09-04 LAB — HEPARIN LEVEL (UNFRACTIONATED): Heparin Unfractionated: 1.08 IU/mL — ABNORMAL HIGH (ref 0.30–0.70)

## 2017-09-04 LAB — BASIC METABOLIC PANEL
Anion gap: 11 (ref 5–15)
BUN: 17 mg/dL (ref 6–20)
CO2: 19 mmol/L — ABNORMAL LOW (ref 22–32)
Calcium: 8.1 mg/dL — ABNORMAL LOW (ref 8.9–10.3)
Chloride: 108 mmol/L (ref 101–111)
Creatinine, Ser: 0.97 mg/dL (ref 0.44–1.00)
GFR calc Af Amer: >60 mL/min (ref >60)
GFR calc non Af Amer: 54 mL/min — ABNORMAL LOW (ref >60)
Glucose, Bld: 158 mg/dL — ABNORMAL HIGH (ref 65–99)
Potassium: 3.7 mmol/L (ref 3.5–5.1)
Sodium: 138 mmol/L (ref 135–145)

## 2017-09-04 LAB — MRSA PCR SCREENING: MRSA by PCR: NEGATIVE

## 2017-09-04 LAB — CBC
HCT: 42 % (ref 36.0–46.0)
Hemoglobin: 14.7 g/dL (ref 12.0–15.0)
MCH: 31.7 pg (ref 26.0–34.0)
MCHC: 35 g/dL (ref 30.0–36.0)
MCV: 90.7 fL (ref 78.0–100.0)
Platelets: 181 10*3/uL (ref 150–400)
RBC: 4.63 MIL/uL (ref 3.87–5.11)
RDW: 13.7 % (ref 11.5–15.5)
WBC: 11.3 10*3/uL — ABNORMAL HIGH (ref 4.0–10.5)

## 2017-09-04 LAB — TROPONIN I: Troponin I: 0.19 ng/mL (ref <0.03)

## 2017-09-04 LAB — APTT
aPTT: 76 seconds — ABNORMAL HIGH (ref 24–36)
aPTT: 111 seconds — ABNORMAL HIGH (ref 24–36)

## 2017-09-04 LAB — PHOSPHORUS: Phosphorus: 3.3 mg/dL (ref 2.5–4.6)

## 2017-09-04 LAB — MAGNESIUM: Magnesium: 1.9 mg/dL (ref 1.7–2.4)

## 2017-09-04 MED ORDER — PROMETHAZINE HCL 25 MG/ML IJ SOLN
12.5000 mg | Freq: Once | INTRAMUSCULAR | Status: AC
  Administered 2017-09-04: 12.5 mg via INTRAVENOUS
  Filled 2017-09-04: qty 1

## 2017-09-04 MED ORDER — BISOPROLOL FUMARATE 5 MG PO TABS
5.0000 mg | ORAL_TABLET | Freq: Once | ORAL | Status: AC
  Administered 2017-09-04: 5 mg via ORAL
  Filled 2017-09-04: qty 1

## 2017-09-04 MED ORDER — ACETAMINOPHEN 325 MG PO TABS
650.0000 mg | ORAL_TABLET | Freq: Four times a day (QID) | ORAL | Status: DC | PRN
Start: 2017-09-04 — End: 2017-09-07
  Administered 2017-09-04 – 2017-09-06 (×9): 650 mg via ORAL
  Filled 2017-09-04 (×9): qty 2

## 2017-09-04 MED ORDER — BISOPROLOL FUMARATE 5 MG PO TABS
10.0000 mg | ORAL_TABLET | Freq: Every day | ORAL | Status: DC
  Administered 2017-09-05: 10 mg via ORAL
  Filled 2017-09-04: qty 2

## 2017-09-04 MED ORDER — DILTIAZEM HCL-DEXTROSE 100-5 MG/100ML-% IV SOLN (PREMIX)
5.0000 mg/h | INTRAVENOUS | Status: DC
Start: 2017-09-04 — End: 2017-09-06
  Administered 2017-09-04: 15 mg/h via INTRAVENOUS
  Administered 2017-09-04: 5 mg/h via INTRAVENOUS
  Filled 2017-09-04 (×2): qty 100

## 2017-09-04 MED ORDER — BISOPROLOL FUMARATE 5 MG PO TABS
5.0000 mg | ORAL_TABLET | Freq: Every day | ORAL | Status: DC
  Administered 2017-09-04: 5 mg via ORAL
  Filled 2017-09-04: qty 1

## 2017-09-04 MED ORDER — FUROSEMIDE 10 MG/ML IJ SOLN
40.0000 mg | Freq: Once | INTRAMUSCULAR | Status: AC
  Administered 2017-09-04: 40 mg via INTRAVENOUS
  Filled 2017-09-04: qty 4

## 2017-09-04 MED ORDER — FLEET ENEMA 7-19 GM/118ML RE ENEM
1.0000 | ENEMA | Freq: Once | RECTAL | Status: AC
  Administered 2017-09-04: 15:00:00 via RECTAL
  Filled 2017-09-04: qty 1

## 2017-09-04 MED ORDER — ORAL CARE MOUTH RINSE
15.0000 mL | Freq: Two times a day (BID) | OROMUCOSAL | Status: DC
  Administered 2017-09-06 – 2017-09-07 (×3): 15 mL via OROMUCOSAL

## 2017-09-04 MED ORDER — POTASSIUM CHLORIDE CRYS ER 20 MEQ PO TBCR
20.0000 meq | EXTENDED_RELEASE_TABLET | Freq: Once | ORAL | Status: AC
  Administered 2017-09-04: 20 meq via ORAL
  Filled 2017-09-04: qty 1

## 2017-09-04 MED ORDER — ZOLPIDEM TARTRATE 5 MG PO TABS
5.0000 mg | ORAL_TABLET | Freq: Every day | ORAL | Status: DC
  Administered 2017-09-04 – 2017-09-08 (×6): 5 mg via ORAL
  Filled 2017-09-04 (×6): qty 1

## 2017-09-04 MED ORDER — LORAZEPAM 2 MG/ML IJ SOLN
0.5000 mg | Freq: Once | INTRAMUSCULAR | Status: DC
  Filled 2017-09-04: qty 1

## 2017-09-04 NOTE — Progress Notes (Signed)
[facility] for Heparin Indication: atrial fibrillation  Allergies  Allergen Reactions  . Alendronate Sodium Other (See Comments)    Arm pain  . Amiodarone Nausea Only  . Ciprofloxacin Other (See Comments)    Not effective  . Coreg [Carvedilol] Other (See Comments)    Fatigue   . Metoprolol Other (See Comments)    Profound fatigue    Patient Measurements: Height: 5\' 5"  (165.1 cm) Weight: 154 lb 15.7 oz (70.3 kg) IBW/kg (Calculated) : 57 Heparin Dosing Weight: 64.4  Vital Signs: Temp: 98.9 F (37.2 C) (12/25 1515) Temp Source: Oral (12/25 1515) BP: 119/91 (12/25 1500) Pulse Rate: 109 (12/25 1523)  Labs: Recent Labs    09/03/17 1709 09/03/17 1711 09/03/17 2257 09/04/17 0012 09/04/17 0434 09/04/17 1406  HGB 14.2 14.3  --   --  14.7  --   HCT 41.6 42.0  --   --  42.0  --   PLT 177  --   --   --  181  --   APTT  --   --  29  --  76* 111*  HEPARINUNFRC  --   --  1.06*  --  1.08*  --   CREATININE 0.98 0.90  --   --  0.97  --   TROPONINI  --   --   --  0.19*  --   --     Estimated Creatinine Clearance: 45.5 mL/min (by C-G formula based on SCr of 0.97 mg/dL).   Assessment: 31 YOF with history of Afib s/p cardioversion week ago, Conversion in ED not successful 12/24 and received CPR - rib fractures.  Now being consulted for heparin for Afib, takes Xarelto PTA with last dose 12/23 around 1700.    APTT 111 on 900 units/hr  Goal of Therapy:  Heparin level 0.3-0.7 units/ml aPTT 66-102 seconds Monitor platelets by anticoagulation protocol: Yes   Plan:  Decrease to 800 units/hr hep Next check 0000 Monitor aPTT   Levester Fresh, PharmD, BCPS, BCCCP Clinical Pharmacist Clinical phone for 09/04/2017 from 7a-3:30p: [phone] If after 3:30p, please call main pharmacy at: [phone] 09/04/2017 4:36 PM

## 2017-09-04 NOTE — Consult Note (Signed)
Cardiology Consultation:   Patient ID: BRANDIS WIXTED; [id number]; 06/26/1935   Admit date: 09/03/2017 Date of Consult: 09/04/2017  Primary Care Provider: Lavone Orn, MD Primary Cardiologist: Aundra Dubin Primary Electrophysiologist:  Allred   Patient Profile:   Cheryl Vargas is a 82 y.o. female with a hx of atrial fibrillation who is being seen today for the evaluation of wide complex tachycardia at the request of Candee Furbish.  History of Present Illness:   Ms. Burdell is an 82 year old female with a history of persistent atrial fibrillation and previously failed antiarrhythmics on bisoprolol who underwent cardioversion last Tuesday and has been feeling poorly since then.  She presented to the emergency room with a wide-complex tachycardia and a heart rate of 242 feeling weak, tired, and exhausted.  Throughout her emergency room stay, she became required cardioversion as well as CPR.  She was cardioverted to atrial fibrillation with rapid rates.  She did develop rib fractures during CPR and is complaining of chest pain.  She is not tolerated amiodarone due to nausea.  Currently her main complaint is chest pain from her rib fractures.  Of note she did receive adenosine x2 with no change of her tachycardia.  No further adenosine was given as she became unstable.  Past Medical History:  Diagnosis Date  . Arthritis   . Chronic combined systolic (congestive) and diastolic (congestive) heart failure ([place])   . Chronic systolic (congestive) heart failure ([place])   . Dysrhythmia    pvc's per pt  . Enlarged heart   . Mitral regurgitation   . Non-ischemic cardiomyopathy ([place])   . Paroxysmal A-fib ([place])   . Paroxysmal atrial fibrillation ([facility])   . Persistent atrial fibrillation ([place])   . PONV (postoperative nausea and vomiting)     Past Surgical History:  Procedure Laterality Date  . BUNIONECTOMY    . CARDIAC CATHETERIZATION     in 2004 at [facility]. "Insignificant blockage" per patient    . CARDIAC CATHETERIZATION N/A 04/25/2016   Procedure: Left Heart Cath and Coronary Angiography;  Surgeon: Nelva Bush, MD;  Location: [place] CV LAB;  Service: Cardiovascular;  Laterality: N/A;  . CARDIOVERSION N/A 06/29/2017   Procedure: CARDIOVERSION;  Surgeon: Larey Dresser, MD;  Location: [facility] ENDOSCOPY;  Service: Cardiovascular;  Laterality: N/A;  . CARDIOVERSION N/A 08/28/2017   Procedure: CARDIOVERSION;  Surgeon: Larey Dresser, MD;  Location: [facility] ENDOSCOPY;  Service: Cardiovascular;  Laterality: N/A;  . SHOULDER SURGERY     closed reduction  . TEE WITHOUT CARDIOVERSION N/A 06/29/2017   Procedure: TRANSESOPHAGEAL ECHOCARDIOGRAM (TEE);  Surgeon: Larey Dresser, MD;  Location: [facility] ENDOSCOPY;  Service: Cardiovascular;  Laterality: N/A;  . TONSILLECTOMY    . TOTAL HIP ARTHROPLASTY Left 03/18/2013   Procedure: TOTAL HIP ARTHROPLASTY ANTERIOR APPROACH;  Surgeon: Mauri Pole, MD;  Location: [facility] ORS;  Service: Orthopedics;  Laterality: Left;  . TUBAL LIGATION       Home Medications:  Prior to Admission medications   Medication Sig Start Date End Date Taking? Authorizing Provider  acetaminophen (TYLENOL) 500 MG tablet Take 500 mg by mouth every 6 (six) hours as needed for moderate pain.   Yes [provider]  bisoprolol (ZEBETA) 5 MG tablet Take 0.5 tablets (2.5 mg total) by mouth 2 (two) times daily. 07/10/17  Yes Larey Dresser, MD  docusate sodium (COLACE) 100 MG capsule Take 100 mg by mouth daily as needed for mild constipation.   Yes [provider]  fluticasone (FLONASE) 50 MCG/ACT nasal  spray Place 1 spray into both nostrils daily as needed for allergies or rhinitis.   Yes [provider]  furosemide (LASIX) 20 MG tablet Take 1 tablet (20 mg total) by mouth as needed for fluid or edema. Patient taking differently: Take 20 mg by mouth daily as needed for fluid or edema.  03/05/17  Yes Larey Dresser, MD  losartan (COZAAR) 25 MG tablet Take 0.5  tablets (12.5 mg total) by mouth at bedtime. 03/05/17  Yes Larey Dresser, MD  naphazoline-pheniramine (NAPHCON-A) 0.025-0.3 % ophthalmic solution Place 1 drop into both eyes daily as needed (dry eyes).    Yes [provider]  Rivaroxaban (XARELTO) 15 MG TABS tablet Take 1 tablet (15 mg total) by mouth daily with supper. 09/27/16  Yes Larey Dresser, MD  zolpidem (AMBIEN) 5 MG tablet Take 5 mg by mouth at bedtime.    Yes [provider]    Inpatient Medications: Scheduled Meds: . bisoprolol  5 mg Oral Daily  . insulin aspart  2-6 Units Subcutaneous Q4H  . [START ON 09/05/2017] mouth rinse  15 mL Mouth Rinse BID  . zolpidem  5 mg Oral QHS   Continuous Infusions: . sodium chloride    . amiodarone 30 mg/hr (09/04/17 0600)  . heparin 900 Units/hr (09/04/17 0600)   PRN Meds: sodium chloride, acetaminophen, fentaNYL (SUBLIMAZE) injection, ondansetron (ZOFRAN) IV  Allergies:    Allergies  Allergen Reactions  . Alendronate Sodium Other (See Comments)    Arm pain  . Amiodarone Nausea Only  . Ciprofloxacin Other (See Comments)    Not effective  . Coreg [Carvedilol] Other (See Comments)    Fatigue   . Metoprolol Other (See Comments)    Profound fatigue    Social History:   Social History   Socioeconomic History  . Marital status: Widowed    Spouse name: Not on file  . Number of children: Not on file  . Years of education: Not on file  . Highest education level: Not on file  Social Needs  . Financial resource strain: Not on file  . Food insecurity - worry: Not on file  . Food insecurity - inability: Not on file  . Transportation needs - medical: Not on file  . Transportation needs - non-medical: Not on file  Occupational History  . Not on file  Tobacco Use  . Smoking status: Never Smoker  . Smokeless tobacco: Never Used  Substance and Sexual Activity  . Alcohol use: Yes    Alcohol/week: 1.8 oz    Types: 3 Glasses of wine per week    Comment: per  week  . Drug use: No  . Sexual activity: Not on file  Other Topics Concern  . Not on file  Social History Narrative  . Not on file    Family History:    Family History  Problem Relation Age of Onset  . Lung cancer Mother   . Heart attack Father      ROS:  Please see the history of present illness.  ROS  All other ROS reviewed and negative.     Physical Exam/Data:   Vitals:   09/04/17 0600 09/04/17 0741 09/04/17 0800 09/04/17 0844  BP: 132/84  114/72   Pulse: (!) 113  (!) 113 (!) 113  Resp: 19  19 20   Temp:  98.7 F (37.1 C)    TempSrc:  Oral    SpO2: (!) 77%  (!) 83% 98%  Weight:  Height:        Intake/Output Summary (Last 24 hours) at 09/04/2017 0910 Last data filed at 09/04/2017 0600 Gross per 24 hour  Intake 548.91 ml  Output 1100 ml  Net -551.09 ml   Filed Weights   09/03/17 2300 09/04/17 0500  Weight: 155 lb 10.3 oz (70.6 kg) 154 lb 15.7 oz (70.3 kg)   Body mass index is 25.79 kg/m.  General:  Well nourished, well developed, in no acute distress HEENT: normal Lymph: no adenopathy Neck: no JVD Endocrine:  No thryomegaly Vascular: No carotid bruits; FA pulses 2+ bilaterally without bruits  Cardiac: Regular rhythm, tachycardic; no murmur  Lungs:  clear to auscultation bilaterally, no wheezing, rhonchi or rales  Abd: soft, nontender, no hepatomegaly  Ext: no edema Musculoskeletal:  No deformities, BUE and BLE strength normal and equal Skin: warm and dry  Neuro:  CNs 2-12 intact, no focal abnormalities noted Psych:  Normal affect   EKG:  The EKG was personally reviewed and demonstrates: Wide-complex tachycardia converting to atrial fibrillation Telemetry:  Telemetry was personally reviewed and demonstrates: Atrial fibrillation  Relevant CV Studies: TTE 07/03/17 - Left ventricle: The cavity size was mildly dilated. Systolic   function was mildly to moderately reduced. The estimated ejection   fraction was in the range of 40% to 45%. Diffuse  hypokinesis.   Doppler parameters are consistent with restrictive physiology,   indicative of decreased left ventricular diastolic compliance   and/or increased left atrial pressure. Doppler parameters are   consistent with elevated ventricular end-diastolic filling   pressure. - Aortic valve: There was mild regurgitation. - Mitral valve: There was moderate to severe regurgitation directed   centrally and posteriorly. - Left atrium: The atrium was moderately dilated. - Right atrium: The atrium was mildly dilated. - Tricuspid valve: There was moderate regurgitation. - Pulmonary arteries: Systolic pressure was moderately increased.   PA peak pressure: 48 mm Hg (S). - Line: A venous catheter was visualized in the superior vena cava,   with its tip in the right atrium. No abnormal features noted.  Laboratory Data:  Chemistry Recent Labs  Lab 08/28/17 0916 09/03/17 1709 09/03/17 1711 09/04/17 0434  NA 138 136 142 138  K 4.5 4.8 4.9 3.7  CL 106 112* 109 108  CO2 23 17*  --  19*  GLUCOSE 115* 164* 169* 158*  BUN 24* 19 27* 17  CREATININE 0.95 0.98 0.90 0.97  CALCIUM 9.0 7.7*  --  8.1*  GFRNONAA 55* 53*  --  54*  GFRAA >60 >60  --  >60  ANIONGAP 9 7  --  11    No results for input(s): PROT, ALBUMIN, AST, ALT, ALKPHOS, BILITOT in the last 168 hours. Hematology Recent Labs  Lab 08/28/17 0916 09/03/17 1709 09/03/17 1711 09/04/17 0434  WBC 6.5 7.8  --  11.3*  RBC 4.73 4.50  --  4.63  HGB 15.1* 14.2 14.3 14.7  HCT 43.4 41.6 42.0 42.0  MCV 91.8 92.4  --  90.7  MCH 31.9 31.6  --  31.7  MCHC 34.8 34.1  --  35.0  RDW 13.4 13.7  --  13.7  PLT 224 177  --  181   Cardiac Enzymes Recent Labs  Lab 09/04/17 0012  TROPONINI 0.19*    Recent Labs  Lab 09/03/17 1709  TROPIPOC 0.03    BNPNo results for input(s): BNP, PROBNP in the last 168 hours.  DDimer No results for input(s): DDIMER in the last 168 hours.  Radiology/Studies:  Dg Chest Port 1 View  Result Date:  09/04/2017 CLINICAL DATA:  Pulmonary edema, post CPR EXAM: PORTABLE CHEST 1 VIEW COMPARISON:  09/03/2017 FINDINGS: There is hyperinflation of the lungs compatible with COPD. Cardiomegaly. Moderate layering bilateral effusions. Vascular congestion and bilateral lower lobe atelectasis or infiltrates. No pneumothorax. IMPRESSION: COPD, cardiomegaly. Moderate layering bilateral effusions with vascular congestion and bilateral lower lobe atelectasis or infiltrates. Electronically Signed   By: Rolm Baptise M.D.   On: 09/04/2017 07:40   Dg Chest Portable 1 View  Result Date: 09/03/2017 CLINICAL DATA:  Status post CPR. EXAM: PORTABLE CHEST 1 VIEW COMPARISON:  10/18/2015 FINDINGS: There is vascular congestion with hazy airspace lung opacity noted in the right mid and lower lung and at the left lung base. No gross pneumothorax on this supine exam. Cardiac silhouette is normal in size. No mediastinal or hilar masses. Skeletal structures are demineralized but grossly intact. IMPRESSION: 1. Vascular congestion with hazy lung opacities, right greater than left, consistent with asymmetric pulmonary edema. Electronically Signed   By: Lajean Manes M.D.   On: 09/03/2017 17:50    Assessment and Plan:   1. Ventricular tachycardia: At this point, it appears that her wide-complex tachycardia is due to ventricular tachycardia.  She has precordial concordance as well as a narrow right bundle branch block which widens out as she gets slower.  She also received adenosine which, per report, did not change her tachycardia.  Unfortunately I do not have access to strips when she was given adenosine.  All of these point to ventricular tachycardia, as well as her QRS morphology.  Due to her ventricular tachycardia, would continue her IV amiodarone at the current dose.  Would switch her to p.o. amiodarone after 24 hours.  If she does tolerate this without nausea, would be able to be discharged on p.o. amiodarone.  We will discuss with  her further the option of ICD therapy.  While she is in the hospital, she may benefit from loading on sotalol if she has significant nausea from her amiodarone and does not have significant bradycardia. 2. Persistent atrial fibrillation: Currently on heparin drip.  Had not missed any doses of Xarelto.  It is likely that she went into atrial fibrillation quickly after her cardioversion.  I am concerned that she will not be able to maintain sinus rhythm with antiarrhythmics.  She has not tolerated amiodarone in the past.  Will retry p.o. amiodarone, but she may benefit from sotalol loading if she does not become significantly bradycardic.  This patients CHA2DS2-VASc Score and unadjusted Ischemic Stroke Rate (% per year) is equal to 4.8 % stroke rate/year from a score of 4  Above score calculated as 1 point each if present [CHF, HTN, DM, Vascular=MI/PAD/Aortic Plaque, Age if 65-74, or Female] Above score calculated as 2 points each if present [Age > 75, or Stroke/TIA/TE]   For questions or updates, please contact [facility] Please consult [email or web address] for contact info under Cardiology/STEMI.   Signed, Will Meredith Leeds, MD  09/04/2017 9:10 AM

## 2017-09-04 NOTE — Progress Notes (Signed)
PULMONARY / CRITICAL CARE MEDICINE   Name: Cheryl Vargas MRN: [id number] DOB: Feb 18, 1935    ADMISSION DATE:  09/03/2017 CONSULTATION DATE:  09/03/2017  REFERRING MD:  Dr. Jeneen Rinks   CHIEF COMPLAINT:  A.Fib RVR   HISTORY OF PRESENT ILLNESS:   82 year old female with PMH of Combined Systolic/Diastolic HF, P. Afib on Xarelto s/p cardioversion last week with failed attempts with antiarrhythmics on Zebeta  Reported to ED on 12/24 with weakness and dyspnea. HR 242, A.fib vs Vtach. Adenosine administered x 2 without relief. Cardioverted. First attempt was not successful and patient went into pulseless VT. Second attempt successful. Loaded with amiodarone. Placed on Bipap for hypoxia. CXR with pulmonary edema. Given 40 meq lasix. Cardiology consulted. PCCM asked to admit.     SUBJECTIVE:  Feeling better. No dyspnea or chest  On high flow O2 with O2 sats 100% .   VITAL SIGNS: BP 114/72   Pulse (!) 113   Temp 98.7 F (37.1 C) (Oral)   Resp 20   Ht 5\' 5"  (1.651 m)   Wt 154 lb 15.7 oz (70.3 kg)   SpO2 98%   BMI 25.79 kg/m   HEMODYNAMICS:    VENTILATOR SETTINGS: FiO2 (%):  [100 %] 100 %  INTAKE / OUTPUT: I/O last 3 completed shifts: In: 548.9 [P.O.:20; I.V.:528.9] Out: 1100 [Urine:1100]  PHYSICAL EXAMINATION: General:  Elderly female on O2 , NAD  Neuro:  A/Ox3  HEENT: MMM  Cardiovascular:  Irreg/Irreg, Tachycardia  Lungs:  Diminished BS in bases  Abdomen: Soft, NT , +BS  Musculoskeletal:  No edema  Skin: WM , intact   LABS:  BMET Recent Labs  Lab 08/28/17 0916 09/03/17 1709 09/03/17 1711 09/04/17 0434  NA 138 136 142 138  K 4.5 4.8 4.9 3.7  CL 106 112* 109 108  CO2 23 17*  --  19*  BUN 24* 19 27* 17  CREATININE 0.95 0.98 0.90 0.97  GLUCOSE 115* 164* 169* 158*    Electrolytes Recent Labs  Lab 08/28/17 0916 09/03/17 1709 09/04/17 0434  CALCIUM 9.0 7.7* 8.1*  MG  --  2.0 1.9  PHOS  --   --  3.3    CBC Recent Labs  Lab 08/28/17 0916  09/03/17 1709 09/03/17 1711 09/04/17 0434  WBC 6.5 7.8  --  11.3*  HGB 15.1* 14.2 14.3 14.7  HCT 43.4 41.6 42.0 42.0  PLT 224 177  --  181    Coag's Recent Labs  Lab 09/03/17 2257 09/04/17 0434  APTT 29 76*    Sepsis Markers Recent Labs  Lab 09/03/17 1711 09/03/17 1757 09/04/17 0012  LATICACIDVEN 4.21* 4.6* 3.7*    ABG Recent Labs  Lab 09/03/17 1754  PHART 7.302*  PCO2ART 27.8*  PO2ART 49.0*    Liver Enzymes No results for input(s): AST, ALT, ALKPHOS, BILITOT, ALBUMIN in the last 168 hours.  Cardiac Enzymes Recent Labs  Lab 09/04/17 0012  TROPONINI 0.19*    Glucose Recent Labs  Lab 09/03/17 2307 09/04/17 0440 09/04/17 0735  GLUCAP 175* 148* 140*    Imaging Dg Chest Port 1 View  Result Date: 09/04/2017 CLINICAL DATA:  Pulmonary edema, post CPR EXAM: PORTABLE CHEST 1 VIEW COMPARISON:  09/03/2017 FINDINGS: There is hyperinflation of the lungs compatible with COPD. Cardiomegaly. Moderate layering bilateral effusions. Vascular congestion and bilateral lower lobe atelectasis or infiltrates. No pneumothorax. IMPRESSION: COPD, cardiomegaly. Moderate layering bilateral effusions with vascular congestion and bilateral lower lobe atelectasis or infiltrates. Electronically Signed   By: Lennette Bihari  [place] M.D.   On: 09/04/2017 07:40   Dg Chest Portable 1 View  Result Date: 09/03/2017 CLINICAL DATA:  Status post CPR. EXAM: PORTABLE CHEST 1 VIEW COMPARISON:  10/18/2015 FINDINGS: There is vascular congestion with hazy airspace lung opacity noted in the right mid and lower lung and at the left lung base. No gross pneumothorax on this supine exam. Cardiac silhouette is normal in size. No mediastinal or hilar masses. Skeletal structures are demineralized but grossly intact. IMPRESSION: 1. Vascular congestion with hazy lung opacities, right greater than left, consistent with asymmetric pulmonary edema. Electronically Signed   By: Lajean Manes M.D.   On: 09/03/2017 17:50      STUDIES:  CXR 12/24 > Vascular congestion with hazy lung opacities, right greater than left, consistent with asymmetric pulmonary edema  CULTURES: None.   ANTIBIOTICS: None.   SIGNIFICANT EVENTS: 12/24 > Presents to ED   LINES/TUBES: PIV   DISCUSSION: 82 year old female presents with A.Fib RVR vs VT. Cardioverted. During cardioversion went into pulseless VT for 3-5 minutes. After arrest alert and oriented on BiPAP. Transitioned to [place] -high flow .    ASSESSMENT / PLAN:  PULMONARY A: Acute Hypoxic Respiratory Failure in setting of pulmonary edema  12/25 CXR w/ bilateral pleural effusions   P:   BiPAP PRN  Titrate O2 for sat >92  Trend CXR Diuresis as b/p and scr allow   CARDIOVASCULAR A:  Brief Pulseless VT arrest 3-5 minutes s/p cardioversion  Recurring A.Fib RVR s/p Multiple Cardioversion > Followed  outpatient by EP Dr. Rayann Heman and Cardiology by [facility], considering ablation vs Tikosyn  Severe MR  Combined Systolic/Diastolic HF (TEE on 32/95 EF 20-25)   P:  Cardiac Monitoring  Cardiology Following  ECHO pending  Continue Amiodarone gtt  Heparin Gtt (in place of Xarelto, last dose 12/23 1700) Lasix 40 x 1 .    RENAL A:   Lactic Acidosis in setting of cardiac arrest , hypoperfusion LA 4.21 > 4.6 >3.7  P:   Trend BMP Replace electrolytes as indicated  Trend LA  Replace K+   GASTROINTESTINAL A:   Nausea (known reaction to amiodarone) >improving  P:   Advance diet as tolerated.    HEMATOLOGIC A:   Chronic Anticoagulation as above  P:  Trend CBC  Trend INR  Heparin as above   INFECTIOUS A:   No issues  P:   Trend WBC and Fever Curve   ENDOCRINE A:   No issues    P:   Trend Glucose   NEUROLOGIC A:   No issues, fully intact s/p arrest  P:   Monitor    FAMILY  - Updates: Family at patient updated at bedside. Patient okay with short-term intubated and CPR if needed   - Inter-disciplinary family meet or Palliative Care meeting  due by: 09/10/2017    Tammy Parrett NP-C  [facility]   Attending Note:  82 year old female with extensive cardiac history presenting with cardiac arrest after cardioversion.  Patient has crackles diffusely on exam.  CXR that I reviewed myself showed pulmonary edema.  Patient remains on and off BiPAP.  Cardiology following and input is appreciated.  Will continue full medical care for now.  Tele monitoring.  BiPAP as needed.  Hold in the ICU.  Monitor for evidence of respiratory failure.  PCCM will continue to follow.  The patient is critically ill with multiple organ systems failure and requires high complexity decision making for assessment and  support, frequent evaluation and titration of therapies, application of advanced monitoring technologies and extensive interpretation of multiple databases.   Critical Care Time devoted to patient care services described in this note is  35  Minutes. This time reflects time of care of this signee Dr Jennet Maduro. This critical care time does not reflect procedure time, or teaching time or supervisory time of PA/NP/Med student/Med Resident etc but could involve care discussion time.  Rush Farmer, M.D. [facility] Pulmonary/Critical Care Medicine. Pager: [phone]. After hours pager: [phone].

## 2017-09-04 NOTE — Progress Notes (Signed)
[place] for Heparin Indication: atrial fibrillation  Allergies  Allergen Reactions  . Alendronate Sodium Other (See Comments)    Arm pain  . Amiodarone Nausea Only  . Ciprofloxacin Other (See Comments)    Not effective  . Coreg [Carvedilol] Other (See Comments)    Fatigue   . Metoprolol Other (See Comments)    Profound fatigue    Patient Measurements: Height: 5\' 5"  (165.1 cm) Weight: 154 lb 15.7 oz (70.3 kg) IBW/kg (Calculated) : 57 Heparin Dosing Weight: 64.4  Vital Signs: Temp: 97.8 F (36.6 C) (12/25 0000) Temp Source: Oral (12/25 0000) BP: 139/95 (12/25 0500) Pulse Rate: 103 (12/25 0500)  Labs: Recent Labs    09/03/17 1709 09/03/17 1711 09/03/17 2257 09/04/17 0012 09/04/17 0434  HGB 14.2 14.3  --   --  14.7  HCT 41.6 42.0  --   --  42.0  PLT 177  --   --   --  181  APTT  --   --  29  --  76*  HEPARINUNFRC  --   --  1.06*  --  1.08*  CREATININE 0.98 0.90  --   --  0.97  TROPONINI  --   --   --  0.19*  --     Estimated Creatinine Clearance: 45.5 mL/min (by C-G formula based on SCr of 0.97 mg/dL).   Medical History: Past Medical History:  Diagnosis Date  . Arthritis   . Chronic combined systolic (congestive) and diastolic (congestive) heart failure ([place])   . Chronic systolic (congestive) heart failure ([place])   . Dysrhythmia    pvc's per pt  . Enlarged heart   . Mitral regurgitation   . Non-ischemic cardiomyopathy ([place])   . Paroxysmal A-fib ([place])   . Paroxysmal atrial fibrillation ([facility])   . Persistent atrial fibrillation ([place])   . PONV (postoperative nausea and vomiting)     Medications:  Infusions:  . sodium chloride    . amiodarone 30 mg/hr (09/04/17 0500)  . heparin 900 Units/hr (09/04/17 0500)    Assessment: 74 YOF with history of Afib s/p cardioversion week ago, Conversion in ED not successful 12/24 and received CPR - rib fractures.  Now being consulted for heparin for Afib, takes Xarelto PTA with  last dose 12/23 around 1700.    12/25 AM: initial aPTT is therapeutic at 76, using aPTT to dose for now given Xarelto influence on anti-Xa levels.   Goal of Therapy:  Heparin level 0.3-0.7 units/ml aPTT 66-102 seconds Monitor platelets by anticoagulation protocol: Yes   Plan:  Cont heparin drip at 900 units/hr 1200 aPTT  Narda Bonds 09/04/2017,6:18 AM

## 2017-09-04 NOTE — Progress Notes (Addendum)
Rt placed pt on Heated High Flow Nasal Cannula, 40% and 40L per Dr. Derrek Gu orders. RN talked to Dr. Shelly Bombard called Dr. Derrek Gu and was instructed to place pt back on regular HFNC (salter).  Pt's SPO2=99%.  Rt will continue to monitor

## 2017-09-04 NOTE — Progress Notes (Signed)
  Echocardiogram 2D Echocardiogram has been performed.  Cheryl Vargas 09/04/2017, 5:03 PM

## 2017-09-04 NOTE — Progress Notes (Signed)
PT is a retired Haematologist. Pt states she decided that she didn't want to be a DNR anymore yesterday. Pt states, " Short term intubation only and no tube feeds." Patient wants everyone to know this.

## 2017-09-04 NOTE — Progress Notes (Signed)
Paged Camn[place], MD as patient is still nauseous despite zofran IV. MD verbal order to stop the amiodarone and give another 5mg  po Bisoprolol.   Will administer and monitor closely.  Lucius Conn, RN

## 2017-09-04 NOTE — Progress Notes (Signed)
Pt complaints of Nausea unrelieved by zofran. Pt requesting Fleet enema as last BM was 3 days ago. Pt does not want Dulcolax or prune juice. Pt very anxious. MD notified. See new orders. Will administer and continue to monitor closely.  Lucius Conn, RN

## 2017-09-05 ENCOUNTER — Inpatient Hospital Stay (HOSPITAL_COMMUNITY): Payer: PPO

## 2017-09-05 DIAGNOSIS — E876 Hypokalemia: Secondary | ICD-10-CM

## 2017-09-05 DIAGNOSIS — G9340 Encephalopathy, unspecified: Secondary | ICD-10-CM

## 2017-09-05 LAB — CBC
HCT: 38.5 % (ref 36.0–46.0)
Hemoglobin: 13.7 g/dL (ref 12.0–15.0)
MCH: 32.1 pg (ref 26.0–34.0)
MCHC: 35.6 g/dL (ref 30.0–36.0)
MCV: 90.2 fL (ref 78.0–100.0)
Platelets: 146 10*3/uL — ABNORMAL LOW (ref 150–400)
RBC: 4.27 MIL/uL (ref 3.87–5.11)
RDW: 14 % (ref 11.5–15.5)
WBC: 9.5 10*3/uL (ref 4.0–10.5)

## 2017-09-05 LAB — BASIC METABOLIC PANEL
Anion gap: 9 (ref 5–15)
BUN: 19 mg/dL (ref 6–20)
CO2: 21 mmol/L — ABNORMAL LOW (ref 22–32)
Calcium: 7.9 mg/dL — ABNORMAL LOW (ref 8.9–10.3)
Chloride: 105 mmol/L (ref 101–111)
Creatinine, Ser: 1.01 mg/dL — ABNORMAL HIGH (ref 0.44–1.00)
GFR calc Af Amer: 59 mL/min — ABNORMAL LOW (ref >60)
GFR calc non Af Amer: 51 mL/min — ABNORMAL LOW (ref >60)
Glucose, Bld: 93 mg/dL (ref 65–99)
Potassium: 3.7 mmol/L (ref 3.5–5.1)
Sodium: 135 mmol/L (ref 135–145)

## 2017-09-05 LAB — APTT
aPTT: 115 seconds — ABNORMAL HIGH (ref 24–36)
aPTT: 77 seconds — ABNORMAL HIGH (ref 24–36)
aPTT: 60 seconds — ABNORMAL HIGH (ref 24–36)

## 2017-09-05 LAB — GLUCOSE, CAPILLARY
Glucose-Capillary: 86 mg/dL (ref 65–99)
Glucose-Capillary: 105 mg/dL — ABNORMAL HIGH (ref 65–99)
Glucose-Capillary: 100 mg/dL — ABNORMAL HIGH (ref 65–99)
Glucose-Capillary: 108 mg/dL — ABNORMAL HIGH (ref 65–99)
Glucose-Capillary: 121 mg/dL — ABNORMAL HIGH (ref 65–99)
Glucose-Capillary: 78 mg/dL (ref 65–99)

## 2017-09-05 LAB — HEPARIN LEVEL (UNFRACTIONATED)
Heparin Unfractionated: 0.47 IU/mL (ref 0.30–0.70)
Heparin Unfractionated: 0.32 IU/mL (ref 0.30–0.70)

## 2017-09-05 LAB — LACTIC ACID, PLASMA: Lactic Acid, Venous: 1.5 mmol/L (ref 0.5–1.9)

## 2017-09-05 MED ORDER — SOTALOL HCL 80 MG PO TABS
80.0000 mg | ORAL_TABLET | Freq: Two times a day (BID) | ORAL | Status: DC
  Administered 2017-09-05 (×2): 80 mg via ORAL
  Filled 2017-09-05 (×3): qty 1

## 2017-09-05 MED ORDER — BISOPROLOL FUMARATE 5 MG PO TABS: 5.0000 mg | ORAL_TABLET | Freq: Every day | ORAL | Status: DC

## 2017-09-05 NOTE — Progress Notes (Signed)
[place] for Heparin Indication: atrial fibrillation  Allergies  Allergen Reactions  . Alendronate Sodium Other (See Comments)    Arm pain  . Amiodarone Nausea Only  . Ciprofloxacin Other (See Comments)    Not effective  . Coreg [Carvedilol] Other (See Comments)    Fatigue   . Metoprolol Other (See Comments)    Profound fatigue    Patient Measurements: Height: 5\' 5"  (165.1 cm) Weight: 154 lb 15.7 oz (70.3 kg) IBW/kg (Calculated) : 57 Heparin Dosing Weight: 64.4  Vital Signs: Temp: 98.7 F (37.1 C) (12/26 0000) Temp Source: Oral (12/26 0000) BP: 100/66 (12/26 0200) Pulse Rate: 98 (12/26 0200)  Labs: Recent Labs    09/03/17 1709 09/03/17 1711  09/03/17 2257 09/04/17 0012 09/04/17 0434 09/04/17 1406 09/05/17 0019  HGB 14.2 14.3  --   --   --  14.7  --   --   HCT 41.6 42.0  --   --   --  42.0  --   --   PLT 177  --   --   --   --  181  --   --   APTT  --   --    < > 29  --  76* 111* 115*  HEPARINUNFRC  --   --   --  1.06*  --  1.08*  --   --   CREATININE 0.98 0.90  --   --   --  0.97  --   --   TROPONINI  --   --   --   --  0.19*  --   --   --    < > = values in this interval not displayed.    Estimated Creatinine Clearance: 45.5 mL/min (by C-G formula based on SCr of 0.97 mg/dL).   Assessment: 82 yo female with history of Afib, Xarelto oh hold, for heparin  Goal of Therapy:  Heparin level 0.3-0.7 units/ml aPTT 66-102 seconds Monitor platelets by anticoagulation protocol: Yes   Plan:  Decrease Heparin 700 units/hr Check aPTT/heparin level in 8 hours.   Phillis Knack, PharmD, BCPS  09/05/2017 2:25 AM

## 2017-09-05 NOTE — Progress Notes (Signed)
Electrophysiology Rounding Note  Patient Name: Cheryl Vargas Date of Encounter: 09/05/2017  Primary Cardiologist: Aundra Dubin Electrophysiologist: Allred   Subjective   The patient is improved today. Still with tachycardia, no chest pain or shortness of breath.   Inpatient Medications    Scheduled Meds: . bisoprolol  10 mg Oral Daily  . insulin aspart  2-6 Units Subcutaneous Q4H  . LORazepam  0.5 mg Intravenous Once  . mouth rinse  15 mL Mouth Rinse BID  . zolpidem  5 mg Oral QHS   Continuous Infusions: . sodium chloride    . diltiazem (CARDIZEM) infusion Stopped (09/05/17 0000)  . heparin 700 Units/hr (09/05/17 0900)   PRN Meds: sodium chloride, acetaminophen, fentaNYL (SUBLIMAZE) injection, ondansetron (ZOFRAN) IV   Vital Signs    Vitals:   09/05/17 0700 09/05/17 0744 09/05/17 0800 09/05/17 0900  BP: 100/71  109/76 109/65  Pulse: 96  (!) 109 (!) 108  Resp: 18  17 (!) 22  Temp:  (!) 97.2 F (36.2 C)    TempSrc:  Oral    SpO2: 93% 94% 92% 96%  Weight:      Height:        Intake/Output Summary (Last 24 hours) at 09/05/2017 0925 Last data filed at 09/05/2017 0900 Gross per 24 hour  Intake 700.28 ml  Output 1500 ml  Net -799.72 ml   Filed Weights   09/03/17 2300 09/04/17 0500 09/05/17 0448  Weight: 155 lb 10.3 oz (70.6 kg) 154 lb 15.7 oz (70.3 kg) 152 lb 12.5 oz (69.3 kg)    Physical Exam    GEN- The patient is elderly appearing, alert and oriented x 3 today.   Head- normocephalic, atraumatic Eyes-  Sclera clear, conjunctiva pink Ears- hearing intact Oropharynx- clear Neck- supple Lungs- Clear to ausculation bilaterally, normal work of breathing Heart- Tachycardic irregular rate and rhythm  GI- soft, NT, ND, + BS Extremities- no clubbing, cyanosis, or edema Skin- no rash or lesion Psych- euthymic mood, full affect Neuro- strength and sensation are intact  Labs    CBC Recent Labs    09/03/17 1709  09/04/17 0434 09/05/17 0423  WBC 7.8  --   11.3* 9.5  NEUTROABS 4.0  --   --   --   HGB 14.2   < > 14.7 13.7  HCT 41.6   < > 42.0 38.5  MCV 92.4  --  90.7 90.2  PLT 177  --  181 146*   < > = values in this interval not displayed.   Basic Metabolic Panel Recent Labs    09/03/17 1709  09/04/17 0434 09/05/17 0423  NA 136   < > 138 135  K 4.8   < > 3.7 3.7  CL 112*   < > 108 105  CO2 17*  --  19* 21*  GLUCOSE 164*   < > 158* 93  BUN 19   < > 17 19  CREATININE 0.98   < > 0.97 1.01*  CALCIUM 7.7*  --  8.1* 7.9*  MG 2.0  --  1.9  --   PHOS  --   --  3.3  --    < > = values in this interval not displayed.  Cardiac Enzymes Recent Labs    09/04/17 0012  TROPONINI 0.19*     Telemetry    AF with RVR (personally reviewed)  Radiology    Dg Chest Port 1 View  Result Date: 09/05/2017 CLINICAL DATA:  Pulmonary edema. EXAM: PORTABLE CHEST  1 VIEW COMPARISON:  09/04/2017. FINDINGS: Cardiomegaly with normal pulmonary vascularity. Dense progressed right perihilar right base atelectasis/ infiltrate. Increased right pleural effusion. Small left pleural effusion again noted. No pneumothorax. IMPRESSION: 1. Dense progressive right perihilar right base atelectasis/ infiltrate and progressive right pleural effusion. 2. Persistent left base atelectasis and infiltrate and small left pleural effusion unchanged. Electronically Signed   By: Marcello Moores  Register   On: 09/05/2017 07:47   Dg Chest Port 1 View  Result Date: 09/04/2017 CLINICAL DATA:  Pulmonary edema, post CPR EXAM: PORTABLE CHEST 1 VIEW COMPARISON:  09/03/2017 FINDINGS: There is hyperinflation of the lungs compatible with COPD. Cardiomegaly. Moderate layering bilateral effusions. Vascular congestion and bilateral lower lobe atelectasis or infiltrates. No pneumothorax. IMPRESSION: COPD, cardiomegaly. Moderate layering bilateral effusions with vascular congestion and bilateral lower lobe atelectasis or infiltrates. Electronically Signed   By: Rolm Baptise M.D.   On: 09/04/2017 07:40    Dg Chest Portable 1 View  Result Date: 09/03/2017 CLINICAL DATA:  Status post CPR. EXAM: PORTABLE CHEST 1 VIEW COMPARISON:  10/18/2015 FINDINGS: There is vascular congestion with hazy airspace lung opacity noted in the right mid and lower lung and at the left lung base. No gross pneumothorax on this supine exam. Cardiac silhouette is normal in size. No mediastinal or hilar masses. Skeletal structures are demineralized but grossly intact. IMPRESSION: 1. Vascular congestion with hazy lung opacities, right greater than left, consistent with asymmetric pulmonary edema. Electronically Signed   By: Lajean Manes M.D.   On: 09/03/2017 17:50     Patient Profile     Cheryl Vargas is a 82 y.o. female with a past medical history significant for persistent AF, chronic diastolic heart failure, at least moderate MR.  She was admitted for symptomatic WCT felt to be VT.   Assessment & Plan    1.  WCT Felt to be VT by Dr Curt Bears and Dr Lovena Le Hemodynamically unstable and required cardioversion on admission EF is down by echo this admission - ?2/2 stunning Cath 2017 with no significant CAD Keep K >3.9, Mg >1.8 Dr Curt Bears discussed treatment options with patient this morning. She is intolerant of amiodarone 2/2 nausea.  He has recommended Sotalol which she is willing to try. Cheryl Vargas start 80mg  twice daily and follow closely. She has a history of ?long QT.  EKG's in SR with QT intervals of 470-442msec. Cheryl Vargas need to follow closely.  Dr Curt Bears also discussed ICD implant prior to discharge   2.  Persistent atrial fibrillation MAZE/MVR has been recommended by Dr Rayann Heman. She has seen Dr Cheryl Vargas and is still considering Sotalol as above Continue Cheryl Vargas for Cheryl Vargas of 4  3.  LV dysfunction EF down from previous studies ? 2/2 stunning from VT Cheryl Vargas follow Euvolemic on exam    Signed, Chanetta Marshall, NP  09/05/2017, 9:25 AM   I have seen and examined this patient with Chanetta Marshall.  Agree with above, note  added to reflect my findings.  On exam, iRRR, no murmurs, lungs clear.  Continues atrial fibrillation today.  Amiodarone was stopped due to intractable nausea.  Plan to start on sotalol for atrial fibrillation and likely ventricular tachycardia.  I did discuss with her the possibility of an ICD which he is agreeable to.  We Brandun Pinn plan to continue sotalol and potentially ICD on Friday.  Yasamin Karel M. Delania Ferg MD 09/05/2017 12:24 PM

## 2017-09-05 NOTE — Progress Notes (Signed)
PULMONARY / CRITICAL CARE MEDICINE   Name: Cheryl Vargas MRN: [id number] DOB: 1935/02/19    ADMISSION DATE:  09/03/2017 CONSULTATION DATE:  09/03/2017  REFERRING MD:  Dr. Jeneen Rinks   CHIEF COMPLAINT:  A.Fib RVR   HISTORY OF PRESENT ILLNESS:   82 year old female with PMH of Combined Systolic/Diastolic HF, P. Afib on Xarelto s/p cardioversion last week with failed attempts with antiarrhythmics on Zebeta  Reported to ED on 12/24 with weakness and dyspnea. HR 242, A.fib vs Vtach. Adenosine administered x 2 without relief. Cardioverted. First attempt was not successful and patient went into pulseless VT. Second attempt successful. Loaded with amiodarone. Placed on Bipap for hypoxia. CXR with pulmonary edema. Given 40 meq lasix. Cardiology consulted. PCCM asked to admit.   SUBJECTIVE:  No events overnight, off HFNC  VITAL SIGNS: BP 109/65   Pulse (!) 108   Temp (!) 97.2 F (36.2 C) (Oral)   Resp (!) 22   Ht 5\' 5"  (1.651 m)   Wt 69.3 kg (152 lb 12.5 oz)   SpO2 96%   BMI 25.42 kg/m   HEMODYNAMICS:    VENTILATOR SETTINGS:    INTAKE / OUTPUT: I/O last 3 completed shifts: In: 1512.3 [P.O.:450; I.V.:1062.3] Out: 2600 [Urine:2600]  PHYSICAL EXAMINATION: General:  Elderly female, NAD Neuro:  Alert and interactive, moving all ext to command HEENT: [place]/AT, PERRL, EOM-I and MMM Cardiovascular:  IRIR, Nl S1/S2 and -M/R/G. Lungs:  CTA bilaterally Abdomen: Soft, NT, ND and +BS Musculoskeletal:  No edema  Skin: WM, intact   LABS:  BMET Recent Labs  Lab 09/03/17 1709 09/03/17 1711 09/04/17 0434 09/05/17 0423  NA 136 142 138 135  K 4.8 4.9 3.7 3.7  CL 112* 109 108 105  CO2 17*  --  19* 21*  BUN 19 27* 17 19  CREATININE 0.98 0.90 0.97 1.01*  GLUCOSE 164* 169* 158* 93   Electrolytes Recent Labs  Lab 09/03/17 1709 09/04/17 0434 09/05/17 0423  CALCIUM 7.7* 8.1* 7.9*  MG 2.0 1.9  --   PHOS  --  3.3  --    CBC Recent Labs  Lab 09/03/17 1709 09/03/17 1711  09/04/17 0434 09/05/17 0423  WBC 7.8  --  11.3* 9.5  HGB 14.2 14.3 14.7 13.7  HCT 41.6 42.0 42.0 38.5  PLT 177  --  181 146*   Coag's Recent Labs  Lab 09/04/17 0434 09/04/17 1406 09/05/17 0019  APTT 76* 111* 115*   Sepsis Markers Recent Labs  Lab 09/03/17 1757 09/04/17 0012 09/05/17 0423  LATICACIDVEN 4.6* 3.7* 1.5   ABG Recent Labs  Lab 09/03/17 1754  PHART 7.302*  PCO2ART 27.8*  PO2ART 49.0*   Liver Enzymes No results for input(s): AST, ALT, ALKPHOS, BILITOT, ALBUMIN in the last 168 hours.  Cardiac Enzymes Recent Labs  Lab 09/04/17 0012  TROPONINI 0.19*   Glucose Recent Labs  Lab 09/04/17 1122 09/04/17 1514 09/04/17 1943 09/04/17 2311 09/05/17 0433 09/05/17 0833  GLUCAP 130* 113* 147* 108* 86 105*   Imaging Dg Chest Port 1 View  Result Date: 09/05/2017 CLINICAL DATA:  Pulmonary edema. EXAM: PORTABLE CHEST 1 VIEW COMPARISON:  09/04/2017. FINDINGS: Cardiomegaly with normal pulmonary vascularity. Dense progressed right perihilar right base atelectasis/ infiltrate. Increased right pleural effusion. Small left pleural effusion again noted. No pneumothorax. IMPRESSION: 1. Dense progressive right perihilar right base atelectasis/ infiltrate and progressive right pleural effusion. 2. Persistent left base atelectasis and infiltrate and small left pleural effusion unchanged. Electronically Signed   By: Marcello Moores  Register   On: 09/05/2017 07:47   STUDIES:  CXR 12/24 > Vascular congestion with hazy lung opacities, right greater than left, consistent with asymmetric pulmonary edema  CULTURES: None.   ANTIBIOTICS: None.   SIGNIFICANT EVENTS: 12/24 > Presents to ED   LINES/TUBES: PIV   I reviewed CXR myself, no acute disease noted  DISCUSSION: 82 year old female presents with A.Fib RVR vs VT. Cardioverted. During cardioversion went into pulseless VT for 3-5 minutes. After arrest alert and oriented on BiPAP. Transitioned to [place] -high flow .   ASSESSMENT /  PLAN:  PULMONARY A: Acute Hypoxic Respiratory Failure in setting of pulmonary edema  12/25 CXR w/ bilateral pleural effusions   P:   D/C BiPAP Titrate O2 for sat >92  Trend CXR Diureses as BP allows D/C HFNC  CARDIOVASCULAR A:  Brief Pulseless VT arrest 3-5 minutes s/p cardioversion  Recurring A.Fib RVR s/p Multiple Cardioversion > Followed  outpatient by EP Dr. Rayann Heman and Cardiology by [facility], considering ablation vs Tikosyn  Severe MR  Combined Systolic/Diastolic HF (TEE on 83/66 EF 20-25)   P:  EP following ?ICD need Echo per cards D/C amio and start satolol  Heparin Gtt (in place of Xarelto, last dose 12/23 1700) D/C lasix   RENAL A:   Lactic Acidosis in setting of cardiac arrest , hypoperfusion LA 4.21 > 4.6 >3.7  P:   Trend BMP Replace electrolytes as indicated  Replace electrolytes as indicated  GASTROINTESTINAL A:   Nausea (known reaction to amiodarone) >improving  P:   Advance diet as tolerated.   HEMATOLOGIC A:   Chronic Anticoagulation as above  P:  Trend CBC  INR in AM Heparin as above   INFECTIOUS A:   No issues  P:   Trend WBC and Fever Curve   ENDOCRINE A:   No issues    P:   Trend Glucose   NEUROLOGIC A:   No issues, fully intact s/p arrest  P:   Monitor   FAMILY  - Updates: Family at patient updated at bedside. Patient okay with short-term intubated and CPR if needed   - Inter-disciplinary family meet or Palliative Care meeting due by: 09/10/2017   Transfer to tele and to [facility] service with PCCM off 12/27.  Discussed with TRH-MD and PCCM-NP.  Rush Farmer, M.D. [facility] Pulmonary/Critical Care Medicine. Pager: [phone]. After hours pager: [phone].

## 2017-09-05 NOTE — Progress Notes (Signed)
[place] for Heparin Indication: atrial fibrillation  Allergies  Allergen Reactions  . Alendronate Sodium Other (See Comments)    Arm pain  . Amiodarone Nausea Only  . Ciprofloxacin Other (See Comments)    Not effective  . Coreg [Carvedilol] Other (See Comments)    Fatigue   . Metoprolol Other (See Comments)    Profound fatigue    Patient Measurements: Height: 5\' 5"  (165.1 cm) Weight: 152 lb 12.5 oz (69.3 kg) IBW/kg (Calculated) : 57 Heparin Dosing Weight: 64.4  Vital Signs: Temp: 97.2 F (36.2 C) (12/26 0744) Temp Source: Oral (12/26 0744) BP: 109/65 (12/26 0900) Pulse Rate: 108 (12/26 0900)  Labs: Recent Labs    09/03/17 1709 09/03/17 1711  09/03/17 2257 09/04/17 0012 09/04/17 0434 09/04/17 1406 09/05/17 0019 09/05/17 0423 09/05/17 1119  HGB 14.2 14.3  --   --   --  14.7  --   --  13.7  --   HCT 41.6 42.0  --   --   --  42.0  --   --  38.5  --   PLT 177  --   --   --   --  181  --   --  146*  --   APTT  --   --    < > 29  --  76* 111* 115*  --  77*  HEPARINUNFRC  --   --   --  1.06*  --  1.08*  --   --   --  0.47  CREATININE 0.98 0.90  --   --   --  0.97  --   --  1.01*  --   TROPONINI  --   --   --   --  0.19*  --   --   --   --   --    < > = values in this interval not displayed.    Estimated Creatinine Clearance: 43.4 mL/min (A) (by C-G formula based on SCr of 1.01 mg/dL (H)).   Assessment: 56 YOF with history of Afib s/p cardioversion week ago, conversion in ED not successful 12/24 and received CPR - rib fractures.  Now being consulted for heparin for Afib, takes Xarelto PTA with last dose 12/23 around 1700.    APTT came back therapeutic at 77 (heparin level also therapeutic at 0.47) on 700 units/hr. Hgb stable, platelets down slightly from 177 to 146. No signs/symptoms of bleeding. No issues with heparin infusion.   Goal of Therapy:  Heparin level 0.3-0.7 units/ml aPTT 66-102 seconds Monitor platelets by  anticoagulation protocol: Yes   Plan:  Continue heparin at 700 units/hr hep Confirmatory heparin level in 8 hours Monitor daily heparin level and CBC as needed. Follow up with plan to transition back to home Xarelto  Doylene Canard, PharmD Clinical Pharmacist  Pager: [phone] Clinical Phone for 09/05/2017 until 3:30pm: [phone] If after 3:30pm, please call main pharmacy at [phone] 09/05/2017 1:05 PM

## 2017-09-05 NOTE — Care Management Note (Addendum)
Case Management Note  Patient Details  Name: Cheryl Vargas MRN: [id number] Date of Birth: 1935-01-25  Subjective/Objective:   From home alone, presents with  A.Fib RVR vs VT. Cardioverted. During cardioversion went into pulseless VT for 3-5 minutes. After arrest alert and oriented on BiPAP. Transitioned to HFNC.  [place] with [facility] states patient's son , [name] reached out to her and he wants [facility] for [facility], HHPT, NCM has not spoken to son to confirm this yet.                    Action/Plan: NCM will follow for dc needs.   Expected Discharge Date:                  Expected Discharge Plan:     In-House Referral:     Discharge planning Services  CM Consult  Post Acute Care Choice:    Choice offered to:     DME Arranged:    DME Agency:     HH Arranged:    HH Agency:     Status of Service:  In process, will continue to follow  If discussed at Long Length of Stay Meetings, dates discussed:    Additional Comments:  Zenon Mayo, RN 09/05/2017, 8:03 PM

## 2017-09-05 NOTE — Progress Notes (Signed)
[place] for Heparin Indication: atrial fibrillation  Allergies  Allergen Reactions  . Alendronate Sodium Other (See Comments)    Arm pain  . Amiodarone Nausea Only  . Ciprofloxacin Other (See Comments)    Not effective  . Coreg [Carvedilol] Other (See Comments)    Fatigue   . Metoprolol Other (See Comments)    Profound fatigue    Patient Measurements: Height: 5\' 5"  (165.1 cm) Weight: 152 lb 12.5 oz (69.3 kg) IBW/kg (Calculated) : 57 Heparin Dosing Weight: 64.4  Vital Signs: Temp: 98.1 F (36.7 C) (12/26 1944) Temp Source: Oral (12/26 1944) BP: 115/77 (12/26 1944) Pulse Rate: 108 (12/26 1634)  Labs: Recent Labs    09/03/17 1709 09/03/17 1711  09/04/17 0012 09/04/17 0434  09/05/17 0019 09/05/17 0423 09/05/17 1119 09/05/17 2125  HGB 14.2 14.3  --   --  14.7  --   --  13.7  --   --   HCT 41.6 42.0  --   --  42.0  --   --  38.5  --   --   PLT 177  --   --   --  181  --   --  146*  --   --   APTT  --   --    < >  --  76*   < > 115*  --  77* 60*  HEPARINUNFRC  --   --    < >  --  1.08*  --   --   --  0.47 0.32  CREATININE 0.98 0.90  --   --  0.97  --   --  1.01*  --   --   TROPONINI  --   --   --  0.19*  --   --   --   --   --   --    < > = values in this interval not displayed.    Estimated Creatinine Clearance: 43.4 mL/min (A) (by C-G formula based on SCr of 1.01 mg/dL (H)).   Assessment: 42 YOF with history of Afib s/p cardioversion week ago, conversion in ED not successful 12/24 and received CPR - rib fractures.  Now being consulted for heparin for Afib, takes Xarelto PTA with last dose 12/23 around 1700.    APTT came back therapeutic at 77 (heparin level also therapeutic at 0.47) on 700 units/hr.  Recheck to confirm HL0.3, aptt 60sec at bottom end of range - will inrease slightly. Hgb stable, platelets down slightly from 177 to 146. No signs/symptoms of bleeding. No issues with heparin infusion.   Goal of Therapy:   Heparin level 0.3-0.7 units/ml aPTT 66-102 seconds Monitor platelets by anticoagulation protocol: Yes   Plan:  Increase heparin 800 units/hr  Monitor daily heparin level and CBC as needed. Follow up with plan to transition back to home [place].D. CPP, BCPS Clinical Pharmacist [phone] 09/05/2017 10:38 PM

## 2017-09-06 ENCOUNTER — Encounter (HOSPITAL_COMMUNITY): Payer: Self-pay

## 2017-09-06 DIAGNOSIS — I4891 Unspecified atrial fibrillation: Secondary | ICD-10-CM

## 2017-09-06 DIAGNOSIS — I340 Nonrheumatic mitral (valve) insufficiency: Secondary | ICD-10-CM

## 2017-09-06 DIAGNOSIS — I5041 Acute combined systolic (congestive) and diastolic (congestive) heart failure: Secondary | ICD-10-CM

## 2017-09-06 LAB — MAGNESIUM: Magnesium: 1.9 mg/dL (ref 1.7–2.4)

## 2017-09-06 LAB — GLUCOSE, CAPILLARY
Glucose-Capillary: 113 mg/dL — ABNORMAL HIGH (ref 65–99)
Glucose-Capillary: 118 mg/dL — ABNORMAL HIGH (ref 65–99)
Glucose-Capillary: 122 mg/dL — ABNORMAL HIGH (ref 65–99)
Glucose-Capillary: 126 mg/dL — ABNORMAL HIGH (ref 65–99)
Glucose-Capillary: 89 mg/dL (ref 65–99)
Glucose-Capillary: 89 mg/dL (ref 65–99)

## 2017-09-06 LAB — BASIC METABOLIC PANEL
Anion gap: 7 (ref 5–15)
BUN: 18 mg/dL (ref 6–20)
CO2: 23 mmol/L (ref 22–32)
Calcium: 7.8 mg/dL — ABNORMAL LOW (ref 8.9–10.3)
Chloride: 106 mmol/L (ref 101–111)
Creatinine, Ser: 0.88 mg/dL (ref 0.44–1.00)
GFR calc Af Amer: >60 mL/min (ref >60)
GFR calc non Af Amer: >60 mL/min (ref >60)
Glucose, Bld: 94 mg/dL (ref 65–99)
Potassium: 3.5 mmol/L (ref 3.5–5.1)
Sodium: 136 mmol/L (ref 135–145)

## 2017-09-06 LAB — HEPARIN LEVEL (UNFRACTIONATED): Heparin Unfractionated: 0.38 IU/mL (ref 0.30–0.70)

## 2017-09-06 MED ORDER — MEXILETINE HCL 200 MG PO CAPS
200.0000 mg | ORAL_CAPSULE | Freq: Two times a day (BID) | ORAL | Status: DC
  Administered 2017-09-06 – 2017-09-07 (×3): 200 mg via ORAL
  Filled 2017-09-06 (×5): qty 1

## 2017-09-06 MED ORDER — PROMETHAZINE HCL 25 MG/ML IJ SOLN
12.5000 mg | Freq: Once | INTRAMUSCULAR | Status: AC
  Administered 2017-09-06: 12.5 mg via INTRAVENOUS
  Filled 2017-09-06: qty 1

## 2017-09-06 MED ORDER — BISOPROLOL FUMARATE 5 MG PO TABS
10.0000 mg | ORAL_TABLET | Freq: Every day | ORAL | Status: DC
Start: 2017-09-06 — End: 2017-09-09
  Administered 2017-09-06 – 2017-09-09 (×4): 10 mg via ORAL
  Filled 2017-09-06 (×4): qty 2

## 2017-09-06 MED ORDER — POTASSIUM CHLORIDE CRYS ER 20 MEQ PO TBCR
40.0000 meq | EXTENDED_RELEASE_TABLET | Freq: Once | ORAL | Status: AC
  Administered 2017-09-06: 40 meq via ORAL
  Filled 2017-09-06: qty 2

## 2017-09-06 NOTE — Progress Notes (Signed)
Notified on call team of patient's nausea

## 2017-09-06 NOTE — Progress Notes (Signed)
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 HR 242, A.fib vs Vtach. Adenosine administered x 2 without relief. Cardioverted. First attempt was not successful and patient went into pulseless VT. Second attempt successful. Loaded with amiodarone. Placed on Bipap for hypoxia. CXR with pulmonary edema. Given 40 meq lasix. Cardiology consulted. PCCM asked to admit.   82 year old woman with past medical history of atrial fibrillation that has been difficult to control presented to the emergency department with a heart rate greater than 200. Adenosine was unsuccessful in converting the rhythm at which time she was given a synchronized cardioversion. After synchronous cardioversion she went into ventricular tachycardia without a pulse and required ACLS in 4-5 minutes of CPR with defibrillation. After return of spontaneous circulation she became alert. She subsequently developed hypoxia in the setting of pulmonary edema and was placed on BiPAP. She was evaluated by cardiology and recommended beta-blocker, Lasix, amiodarone.    Subjective: 12/27 A/O 4, positive noncardiac CP, negative SOB, negative abdominal pain, negative N/V    Assessment & Plan:   Active Problems:   A-fib (HCC)   Acute Hypoxic Respiratory Failure in setting of pulmonary edema  -12/25 CXR w/ bilateral pleural effusions  -Titrate O2 to maintain SPO2> 84%  Combined Systolic/Diastolic CHF (TEE on 16/60 EF 20-25)   -Strict in and out -Daily weight -Medications per cardiology  Pulseless VT arrest 3-5 minutes s/p cardioversion    -Scheduled ICD placement 12/28?  Recurring A.Fib RVR s/p Multiple Cardioversion > - Followed  outpatient by EP Dr. Rayann Heman and Cardiology by [facility] -MAZE vs MVR. Patient still considering -Rate still not well-controlled -Medication increases per cardiology   Severe MR   -see A. fib RVR       DVT prophylaxis: Heparin drip Code Status: Full Family Communication: Family at bedside discussion of plan of care Disposition Plan: TBD   Consultants:  Cardiology    Procedures/Significant Events:  CXR 12/24 > Vascular congestion with hazy lung opacities, right greater than left, consistent with asymmetric pulmonary edema     I have personally reviewed and interpreted all radiology studies and my findings are as above.  VENTILATOR SETTINGS:    Cultures   Antimicrobials: Anti-infectives (From admission, onward)   None       Devices    LINES / TUBES:      Continuous Infusions: . sodium chloride    . diltiazem (CARDIZEM) infusion Stopped (09/05/17 0000)  . heparin 800 Units/hr (09/05/17 2319)     Objective: Vitals:   09/05/17 2254 09/06/17 0413 09/06/17 0426 09/06/17 0818  BP:    110/74  Pulse:      Resp:      Temp: 98.1 F (36.7 C) 98.1 F (36.7 C)  97.9 F (36.6 C)  TempSrc:    Oral  SpO2:      Weight:   159 lb 6.3 oz (72.3 kg)   Height:        Intake/Output Summary (Last 24 hours) at 09/06/2017 0820 Last data filed at 09/06/2017 0414 Gross per 24 hour  Intake 136.68 ml  Output 150 ml  Net -13.32 ml   Filed Weights   09/04/17  0500 09/05/17 0448 09/06/17 0426  Weight: 154 lb 15.7 oz (70.3 kg) 152 lb 12.5 oz (69.3 kg) 159 lb 6.3 oz (72.3 kg)    Examination:  General: A/O 4, No acute respiratory distress Lungs: Clear to auscultation bilaterally without wheezes or crackles Cardiovascular: Irregular irregular rhythm and rate, without murmur gallop or rub normal S1 and S2 Abdomen: negative abdominal pain, nondistended, positive soft, bowel  sounds, no rebound, no ascites, no appreciable mass Extremities: No significant cyanosis, clubbing, or edema bilateral lower extremities Skin: Negative rashes, lesions, ulcers Psychiatric:  Negative depression, negative anxiety, negative fatigue, negative mania  Central nervous system:  Cranial nerves II through XII intact, tongue/uvula midline, all extremities muscle strength 5/5, sensation intact throughout, finger nose finger bilateral within normal limits, quick finger touch bilateral within normal limits, negative dysarthria, negative expressive aphasia, negative receptive aphasia.  .     Data Reviewed: Care during the described time interval was provided by me .  I have reviewed this patient's available data, including medical history, events of note, physical examination, and all test results as part of my evaluation.   CBC: Recent Labs  Lab 09/03/17 1709 09/03/17 1711 09/04/17 0434 09/05/17 0423  WBC 7.8  --  11.3* 9.5  NEUTROABS 4.0  --   --   --   HGB 14.2 14.3 14.7 13.7  HCT 41.6 42.0 42.0 38.5  MCV 92.4  --  90.7 90.2  PLT 177  --  181 185*   Basic Metabolic Panel: Recent Labs  Lab 09/03/17 1709 09/03/17 1711 09/04/17 0434 09/05/17 0423 09/06/17 0321  NA 136 142 138 135 136  K 4.8 4.9 3.7 3.7 3.5  CL 112* 109 108 105 106  CO2 17*  --  19* 21* 23  GLUCOSE 164* 169* 158* 93 94  BUN 19 27* 17 19 18   CREATININE 0.98 0.90 0.97 1.01* 0.88  CALCIUM 7.7*  --  8.1* 7.9* 7.8*  MG 2.0  --  1.9  --  1.9  PHOS  --   --  3.3  --   --    GFR: Estimated Creatinine Clearance: 50.8 mL/min (by C-G formula based on SCr of 0.88 mg/dL). Liver Function Tests: No results for input(s): AST, ALT, ALKPHOS, BILITOT, PROT, ALBUMIN in the last 168 hours. No results for input(s): LIPASE, AMYLASE in the last 168 hours. No results for input(s): AMMONIA in the last 168 hours. Coagulation Profile: No results for input(s): INR, PROTIME in the last 168 hours. Cardiac Enzymes: Recent Labs   Lab 09/04/17 0012  TROPONINI 0.19*   BNP (last 3 results) No results for input(s): PROBNP in the last 8760 hours. HbA1C: No results for input(s): HGBA1C in the last 72 hours. CBG: Recent Labs  Lab 09/05/17 1658 09/05/17 2001 09/05/17 2256 09/06/17 0412 09/06/17 0816  GLUCAP 108* 121* 78 89 126*   Lipid Profile: No results for input(s): CHOL, HDL, LDLCALC, TRIG, CHOLHDL, LDLDIRECT in the last 72 hours. Thyroid Function Tests: No results for input(s): TSH, T4TOTAL, FREET4, T3FREE, THYROIDAB in the last 72 hours. Anemia Panel: No results for input(s): VITAMINB12, FOLATE, FERRITIN, TIBC, IRON, RETICCTPCT in the last 72 hours. Urine analysis:    Component Value Date/Time   COLORURINE YELLOW 04/23/2016 2008   APPEARANCEUR CLOUDY (A) 04/23/2016 2008   LABSPEC 1.016 04/23/2016 2008   PHURINE 5.0 04/23/2016 2008   GLUCOSEU NEGATIVE 04/23/2016 2008   HGBUR SMALL (A) 04/23/2016 2008   BILIRUBINUR NEGATIVE 04/23/2016 2008   [place] NEGATIVE 04/23/2016 2008  PROTEINUR NEGATIVE 04/23/2016 2008   UROBILINOGEN 0.2 03/17/2013 1423   NITRITE NEGATIVE 04/23/2016 2008   LEUKOCYTESUR MODERATE (A) 04/23/2016 2008   Sepsis Labs: @LABRCNTIP (procalcitonin:4,lacticidven:4)  ) Recent Results (from the past 240 hour(s))  MRSA PCR Screening     Status: None   Collection Time: 09/03/17 10:53 PM  Result Value Ref Range Status   MRSA by PCR NEGATIVE NEGATIVE Final    Comment:        The GeneXpert MRSA Assay (FDA approved for NASAL specimens only), is one component of a comprehensive MRSA colonization surveillance program. It is not intended to diagnose MRSA infection nor to guide or monitor treatment for MRSA infections.          Radiology Studies: Dg Chest Port 1 View  Result Date: 09/05/2017 CLINICAL DATA:  Pulmonary edema. EXAM: PORTABLE CHEST 1 VIEW COMPARISON:  09/04/2017. FINDINGS: Cardiomegaly with normal pulmonary vascularity. Dense progressed right perihilar right  base atelectasis/ infiltrate. Increased right pleural effusion. Small left pleural effusion again noted. No pneumothorax. IMPRESSION: 1. Dense progressive right perihilar right base atelectasis/ infiltrate and progressive right pleural effusion. 2. Persistent left base atelectasis and infiltrate and small left pleural effusion unchanged. Electronically Signed   By: Carlton   On: 09/05/2017 07:47        Scheduled Meds: . bisoprolol  10 mg Oral Daily  . insulin aspart  2-6 Units Subcutaneous Q4H  . LORazepam  0.5 mg Intravenous Once  . mouth rinse  15 mL Mouth Rinse BID  . mexiletine  200 mg Oral Q12H  . potassium chloride  40 mEq Oral Once  . zolpidem  5 mg Oral QHS   Continuous Infusions: . sodium chloride    . diltiazem (CARDIZEM) infusion Stopped (09/05/17 0000)  . heparin 800 Units/hr (09/05/17 2319)     LOS: 3 days    Time spent: 40 minutes    WOODS, Geraldo Docker, MD [facility] Pager [phone]   If 7PM-7AM, please contact night-coverage [email or web address] Password [email or web address] 09/06/2017, 8:20 AM

## 2017-09-06 NOTE — Progress Notes (Signed)
Electrophysiology Rounding Note  Patient Name: Cheryl Vargas Date of Encounter: 09/06/2017  Primary Cardiologist: Aundra Dubin Electrophysiologist: Allred   Subjective   C/w some chest wall soreness, no CP otherwise, no SOB, no palpitations  Inpatient Medications    Scheduled Meds: . bisoprolol  10 mg Oral Daily  . insulin aspart  2-6 Units Subcutaneous Q4H  . mouth rinse  15 mL Mouth Rinse BID  . mexiletine  200 mg Oral Q12H  . zolpidem  5 mg Oral QHS   Continuous Infusions: . sodium chloride    . diltiazem (CARDIZEM) infusion Stopped (09/05/17 0000)  . heparin 800 Units/hr (09/05/17 2319)   PRN Meds: sodium chloride, acetaminophen, fentaNYL (SUBLIMAZE) injection, ondansetron (ZOFRAN) IV   Vital Signs    Vitals:   09/06/17 0426 09/06/17 0818 09/06/17 1110 09/06/17 1215  BP:  110/74  104/64  Pulse:  (!) 116 (!) 107 94  Resp:  20 (!) 27 18  Temp:  97.9 F (36.6 C)  97.8 F (36.6 C)  TempSrc:  Oral  Oral  SpO2:  93% 96% 97%  Weight: 159 lb 6.3 oz (72.3 kg)     Height:        Intake/Output Summary (Last 24 hours) at 09/06/2017 1219 Last data filed at 09/06/2017 1213 Gross per 24 hour  Intake 729.68 ml  Output 150 ml  Net 579.68 ml   Filed Weights   09/04/17 0500 09/05/17 0448 09/06/17 0426  Weight: 154 lb 15.7 oz (70.3 kg) 152 lb 12.5 oz (69.3 kg) 159 lb 6.3 oz (72.3 kg)    Physical Exam    GEN- The patient is elderly appearing, alert and oriented x 3 today.   Head- normocephalic, atraumatic Eyes-  Sclera clear, conjunctiva pink Ears- hearing intact Oropharynx- clear Neck- supple Lungs- CTA b/l, normal work of breathing Heart- iRRR, tachycardic  GI- soft, NT, ND Extremities- no clubbing, cyanosis, or edema Skin- no rash or lesion Psych- euthymic mood, full affect Neuro- strength and sensation are intact  Labs    CBC Recent Labs    09/03/17 1709  09/04/17 0434 09/05/17 0423  WBC 7.8  --  11.3* 9.5  NEUTROABS 4.0  --   --   --   HGB 14.2    < > 14.7 13.7  HCT 41.6   < > 42.0 38.5  MCV 92.4  --  90.7 90.2  PLT 177  --  181 146*   < > = values in this interval not displayed.   Basic Metabolic Panel Recent Labs    09/04/17 0434 09/05/17 0423 09/06/17 0321  NA 138 135 136  K 3.7 3.7 3.5  CL 108 105 106  CO2 19* 21* 23  GLUCOSE 158* 93 94  BUN 17 19 18   CREATININE 0.97 1.01* 0.88  CALCIUM 8.1* 7.9* 7.8*  MG 1.9  --  1.9  PHOS 3.3  --   --   Cardiac Enzymes Recent Labs    09/04/17 0012  TROPONINI 0.19*     Telemetry    AF 90's-120's, generally low 100's (personally reviewed)  Radiology    Dg Chest Port 1 View Result Date: 09/05/2017 CLINICAL DATA:  Pulmonary edema. EXAM: PORTABLE CHEST 1 VIEW COMPARISON:  09/04/2017. FINDINGS: Cardiomegaly with normal pulmonary vascularity. Dense progressed right perihilar right base atelectasis/ infiltrate. Increased right pleural effusion. Small left pleural effusion again noted. No pneumothorax. IMPRESSION: 1. Dense progressive right perihilar right base atelectasis/ infiltrate and progressive right pleural effusion. 2. Persistent left base atelectasis  and infiltrate and small left pleural effusion unchanged. Electronically Signed   By: Marcello Moores  Register   On: 09/05/2017 07:47     Patient Profile     Cheryl Vargas is a 82 y.o. female with a past medical history significant for persistent AF, chronic diastolic heart failure, at least moderate MR.  She was admitted for symptomatic WCT felt to be VT.   Assessment & Plan    1.  WCT Felt to be VT by Dr Curt Bears and Dr Lovena Le Hemodynamically unstable and required cardioversion on admission EF is down by echo this admission - ?2/2 stunning Cath 2017 with no significant CAD Keep K >3.9, Mg >1.8 Dr Curt Bears discussed treatment options with patient yesterday, unfortunately she is intolerant of amiodarone 2/2 nausea. EKG post sotalol with QT prolongation and sotalol stopped, Mexiletine started today, unfortunately this Rayni Nemitz not  help with her AF  Emeka Lindner hold NPO after MN, Dr. Rayann Heman to see tomorrow, pt Earmon Sherrow like to discuss with him ICD implant as well.    2.  Persistent atrial fibrillation MAZE/MVR has been recommended by Dr Rayann Heman. She has seen Dr Roxy Manns and is still considering [facility] out patient, hep gtt here Rate not well controlled Annais Crafts increase her bisoprolol, though not likely enough BP for much more after this  3.  LV dysfunction EF down from previous studies ? 2/2 stunning from VT Ghali Morissette follow Remains euvolemic on exam s/p IV lasix    Signed, Baldwin [place], PA-C  09/06/2017, 12:19 PM   I have seen and examined this patient with Tommye Standard.  Agree with above, note added to reflect my findings.  On exam, RRR, no murmurs, lungs clear.  QTC significantly prolonged on sotalol.  We Lerlene Treadwell plan to switch to mexiletine today.  She Alexa Golebiewski likely need an ICD prior to discharge, which may be tomorrow.  For atrial fibrillation control, she likely Adonijah Baena need mitral valve repair along with surgical ablation.  Jackston [place] M. Khamil Lamica MD 09/06/2017 12:33 PM

## 2017-09-06 NOTE — Consult Note (Signed)
   [facility] CM Inpatient Consult   09/06/2017  [name] 25-Dec-1934 [id number]  Patient was screened for care Vargas needs in the [employer]/ACO plan.  Patient was admitted with Atrial fib with HR greater than 200 bpm. VT arrest.  Met with patient and son at the bedside regarding Cheryl Vargas services.  Patient states she is not sure if she will get a defibrillator tomorrow, but will likely go home with home health, states, like [facility].  Patient expressed no additional needs at this time but would like to a brochure and 24 hour nurse advise line.  For questions, please contact:  Natividad Brood, RN BSN [facility] Liaison  [phone] business mobile phone Toll free office [phone]

## 2017-09-06 NOTE — Progress Notes (Signed)
ANTICOAGULATION CONSULT NOTE - Follow Up Consult  Pharmacy Consult for Heparin Indication: atrial fibrillation  Allergies  Allergen Reactions  . Alendronate Sodium Other (See Comments)    Arm pain  . Amiodarone Nausea Only  . Ciprofloxacin Other (See Comments)    Not effective  . Coreg [Carvedilol] Other (See Comments)    Fatigue   . Metoprolol Other (See Comments)    Profound fatigue    Patient Measurements: Height: 5\' 5"  (165.1 cm) Weight: 159 lb 6.3 oz (72.3 kg) IBW/kg (Calculated) : 57 Heparin Dosing Weight: 65 kg  Vital Signs: Temp: 97.8 F (36.6 C) (12/27 1215) Temp Source: Oral (12/27 1215) BP: 104/64 (12/27 1215) Pulse Rate: 94 (12/27 1215)  Labs: Recent Labs    09/03/17 1709 09/03/17 1711  09/04/17 0012 09/04/17 0434  09/05/17 0019 09/05/17 0423 09/05/17 1119 09/05/17 2125 09/06/17 0321  HGB 14.2 14.3  --   --  14.7  --   --  13.7  --   --   --   HCT 41.6 42.0  --   --  42.0  --   --  38.5  --   --   --   PLT 177  --   --   --  181  --   --  146*  --   --   --   APTT  --   --    < >  --  76*   < > 115*  --  77* 60*  --   HEPARINUNFRC  --   --    < >  --  1.08*  --   --   --  0.47 0.32 0.38  CREATININE 0.98 0.90  --   --  0.97  --   --  1.01*  --   --  0.88  TROPONINI  --   --   --  0.19*  --   --   --   --   --   --   --    < > = values in this interval not displayed.    Estimated Creatinine Clearance: 50.8 mL/min (by C-G formula based on SCr of 0.88 mg/dL).  Assessment:  19 YOF with history of Afib s/p cardioversion week ago, conversion in ED not successful 12/24 and received CPR - rib fractures. Pharmacy consulted for heparin for Afib, takes Xarelto PTA with last dose 12/23 around 1700.    Heparin level remains therapeutic (0.38) on 800 units/hr. No CBC today.  Daily aPTTs discontinued as they were correlating with heparin levels on 12/26.  Considering ICD placement.  Goal of Therapy:  Heparin level 0.3-0.7 units/ml Monitor platelets by  anticoagulation protocol: Yes   Plan:   Continue heparin drip at 800 units/hr.  Daily heparin level and CBC while on heparin.  Follow up plans.  Xarelto on hold.   Arty Baumgartner, [place] Pager: [phone] 09/06/2017,3:18 PM

## 2017-09-07 ENCOUNTER — Encounter (HOSPITAL_COMMUNITY): Payer: Self-pay | Admitting: Certified Registered Nurse Anesthetist

## 2017-09-07 ENCOUNTER — Encounter (HOSPITAL_COMMUNITY)
Admission: EM | Disposition: A | Discharge status: Home Health | Payer: Self-pay | Source: Home / Self Care | Attending: Internal Medicine

## 2017-09-07 DIAGNOSIS — I5043 Acute on chronic combined systolic (congestive) and diastolic (congestive) heart failure: Secondary | ICD-10-CM

## 2017-09-07 DIAGNOSIS — I509 Heart failure, unspecified: Secondary | ICD-10-CM

## 2017-09-07 DIAGNOSIS — I472 Ventricular tachycardia: Secondary | ICD-10-CM

## 2017-09-07 DIAGNOSIS — I4891 Unspecified atrial fibrillation: Secondary | ICD-10-CM

## 2017-09-07 HISTORY — PX: BIV ICD INSERTION CRT-D: EP1195

## 2017-09-07 HISTORY — PX: AV NODE ABLATION: EP1193

## 2017-09-07 LAB — BASIC METABOLIC PANEL
Anion gap: 10 (ref 5–15)
BUN: 17 mg/dL (ref 6–20)
CO2: 21 mmol/L — ABNORMAL LOW (ref 22–32)
Calcium: 8.2 mg/dL — ABNORMAL LOW (ref 8.9–10.3)
Chloride: 106 mmol/L (ref 101–111)
Creatinine, Ser: 0.8 mg/dL (ref 0.44–1.00)
GFR calc Af Amer: >60 mL/min (ref >60)
GFR calc non Af Amer: >60 mL/min (ref >60)
Glucose, Bld: 95 mg/dL (ref 65–99)
Potassium: 4.2 mmol/L (ref 3.5–5.1)
Sodium: 137 mmol/L (ref 135–145)

## 2017-09-07 LAB — GLUCOSE, CAPILLARY
Glucose-Capillary: 113 mg/dL — ABNORMAL HIGH (ref 65–99)
Glucose-Capillary: 83 mg/dL (ref 65–99)
Glucose-Capillary: 106 mg/dL — ABNORMAL HIGH (ref 65–99)
Glucose-Capillary: 133 mg/dL — ABNORMAL HIGH (ref 65–99)

## 2017-09-07 LAB — CBC
HCT: 42 % (ref 36.0–46.0)
Hemoglobin: 14.4 g/dL (ref 12.0–15.0)
MCH: 31.2 pg (ref 26.0–34.0)
MCHC: 34.3 g/dL (ref 30.0–36.0)
MCV: 90.9 fL (ref 78.0–100.0)
Platelets: 190 10*3/uL (ref 150–400)
RBC: 4.62 MIL/uL (ref 3.87–5.11)
RDW: 13.9 % (ref 11.5–15.5)
WBC: 10 10*3/uL (ref 4.0–10.5)

## 2017-09-07 LAB — HEPARIN LEVEL (UNFRACTIONATED): Heparin Unfractionated: 0.41 IU/mL (ref 0.30–0.70)

## 2017-09-07 LAB — MAGNESIUM: Magnesium: 1.8 mg/dL (ref 1.7–2.4)

## 2017-09-07 SURGERY — BIV ICD INSERTION CRT-D

## 2017-09-07 MED ORDER — SODIUM CHLORIDE 0.9% FLUSH: 3.0000 mL | INTRAVENOUS | Status: DC | PRN

## 2017-09-07 MED ORDER — LIDOCAINE HCL (PF) 1 % IJ SOLN
INTRAMUSCULAR | Status: AC
  Filled 2017-09-07: qty 30

## 2017-09-07 MED ORDER — ONDANSETRON HCL 4 MG/2ML IJ SOLN
INTRAMUSCULAR | Status: AC
  Filled 2017-09-07: qty 2

## 2017-09-07 MED ORDER — FENTANYL CITRATE (PF) 100 MCG/2ML IJ SOLN
INTRAMUSCULAR | Status: AC
  Filled 2017-09-07: qty 2

## 2017-09-07 MED ORDER — CEFAZOLIN SODIUM-DEXTROSE 1-4 GM/50ML-% IV SOLN
1.0000 g | Freq: Four times a day (QID) | INTRAVENOUS | Status: AC
  Administered 2017-09-07 – 2017-09-08 (×3): 1 g via INTRAVENOUS
  Filled 2017-09-07 (×3): qty 50

## 2017-09-07 MED ORDER — CEFAZOLIN SODIUM-DEXTROSE 2-4 GM/100ML-% IV SOLN
2.0000 g | INTRAVENOUS | Status: AC
  Administered 2017-09-07: 2 g via INTRAVENOUS

## 2017-09-07 MED ORDER — SODIUM CHLORIDE 0.9 % IV SOLN: INTRAVENOUS | Status: DC

## 2017-09-07 MED ORDER — IOPAMIDOL (ISOVUE-370) INJECTION 76%
INTRAVENOUS | Status: AC
Start: 2017-09-07 — End: 2017-09-07
  Filled 2017-09-07: qty 50

## 2017-09-07 MED ORDER — IOPAMIDOL (ISOVUE-370) INJECTION 76%
INTRAVENOUS | Status: DC | PRN
  Administered 2017-09-07: 15 mL via INTRAVENOUS

## 2017-09-07 MED ORDER — BUPIVACAINE HCL (PF) 0.25 % IJ SOLN
INTRAMUSCULAR | Status: AC
  Filled 2017-09-07: qty 30

## 2017-09-07 MED ORDER — LORAZEPAM 2 MG/ML IJ SOLN
1.0000 mg | Freq: Once | INTRAMUSCULAR | Status: DC
  Filled 2017-09-07: qty 0.5

## 2017-09-07 MED ORDER — LIDOCAINE HCL (PF) 1 % IJ SOLN
INTRAMUSCULAR | Status: AC
  Filled 2017-09-07: qty 60

## 2017-09-07 MED ORDER — ACETAMINOPHEN 325 MG PO TABS
325.0000 mg | ORAL_TABLET | ORAL | Status: DC | PRN
  Administered 2017-09-08 – 2017-09-09 (×2): 650 mg via ORAL
  Filled 2017-09-07 (×2): qty 2

## 2017-09-07 MED ORDER — HYDROCODONE-ACETAMINOPHEN 5-325 MG PO TABS: 1.0000 | ORAL_TABLET | ORAL | Status: DC | PRN

## 2017-09-07 MED ORDER — SODIUM CHLORIDE 0.9 % IR SOLN
80.0000 mg | Status: AC
  Administered 2017-09-07: 80 mg
  Filled 2017-09-07: qty 2

## 2017-09-07 MED ORDER — LIDOCAINE HCL (PF) 1 % IJ SOLN
INTRAMUSCULAR | Status: DC | PRN
  Administered 2017-09-07: 90 mL

## 2017-09-07 MED ORDER — MIDAZOLAM HCL 5 MG/5ML IJ SOLN
INTRAMUSCULAR | Status: DC | PRN
  Administered 2017-09-07 (×2): 0.5 mg via INTRAVENOUS

## 2017-09-07 MED ORDER — PROMETHAZINE HCL 25 MG/ML IJ SOLN
12.5000 mg | Freq: Four times a day (QID) | INTRAMUSCULAR | Status: DC | PRN
  Administered 2017-09-07 – 2017-09-08 (×2): 12.5 mg via INTRAVENOUS
  Filled 2017-09-07 (×2): qty 1

## 2017-09-07 MED ORDER — SODIUM CHLORIDE 0.45 % IV SOLN: INTRAVENOUS | Status: DC

## 2017-09-07 MED ORDER — CHLORHEXIDINE GLUCONATE 4 % EX LIQD: 60.0000 mL | Freq: Once | CUTANEOUS | Status: DC

## 2017-09-07 MED ORDER — ONDANSETRON HCL 4 MG/2ML IJ SOLN
4.0000 mg | Freq: Four times a day (QID) | INTRAMUSCULAR | Status: DC | PRN
  Administered 2017-09-07: 16:00:00 4 mg via INTRAVENOUS
  Filled 2017-09-07 (×2): qty 2

## 2017-09-07 MED ORDER — SODIUM CHLORIDE 0.9 % IR SOLN
Status: AC
  Filled 2017-09-07: qty 2

## 2017-09-07 MED ORDER — SODIUM CHLORIDE 0.9 % IV SOLN: 250.0000 mL | INTRAVENOUS | Status: DC | PRN

## 2017-09-07 MED ORDER — CEFAZOLIN SODIUM-DEXTROSE 2-4 GM/100ML-% IV SOLN
INTRAVENOUS | Status: AC
  Filled 2017-09-07: qty 100

## 2017-09-07 MED ORDER — SODIUM CHLORIDE 0.9% FLUSH
3.0000 mL | Freq: Two times a day (BID) | INTRAVENOUS | Status: DC
  Administered 2017-09-07: 3 mL via INTRAVENOUS

## 2017-09-07 MED ORDER — PANTOPRAZOLE SODIUM 40 MG PO TBEC
40.0000 mg | DELAYED_RELEASE_TABLET | Freq: Every day | ORAL | Status: DC
  Administered 2017-09-07 – 2017-09-09 (×3): 40 mg via ORAL
  Filled 2017-09-07 (×3): qty 1

## 2017-09-07 MED ORDER — HEPARIN (PORCINE) IN NACL 2-0.9 UNIT/ML-% IJ SOLN
INTRAMUSCULAR | Status: AC
  Filled 2017-09-07: qty 500

## 2017-09-07 MED ORDER — MIDAZOLAM HCL 5 MG/5ML IJ SOLN
INTRAMUSCULAR | Status: AC
  Filled 2017-09-07: qty 5

## 2017-09-07 MED ORDER — ONDANSETRON HCL 4 MG/2ML IJ SOLN
INTRAMUSCULAR | Status: DC | PRN
  Administered 2017-09-07: 4 mg via INTRAVENOUS

## 2017-09-07 MED FILL — Medication: Qty: 1 | Status: AC

## 2017-09-07 SURGICAL SUPPLY — 21 items
ADAPTER SEALING SSA-EW-09 (MISCELLANEOUS) ×2 IMPLANT
CABLE SURGICAL S-101-97-12 (CABLE) ×2 IMPLANT
CATH ATTAIN COM SURV 6250V-MB2 (CATHETERS) ×2 IMPLANT
CATH CELSIUS THERM D CV 7F (ABLATOR) ×2 IMPLANT
CATH HEX JOSEPH 2-5-2 65CM 6F (CATHETERS) ×2 IMPLANT
ICD CLARIA MRI DTMA1QQ (ICD Generator) ×2 IMPLANT
KIT ESSENTIALS PG (KITS) ×2 IMPLANT
LEAD ATTAIN PERFORMA S 4598-88 (Lead) ×2 IMPLANT
LEAD CAPSURE NOVUS 5076-52CM (Lead) ×2 IMPLANT
LEAD SPRINT QUAT SEC 6935M-62 (Lead) ×2 IMPLANT
PACK EP LATEX FREE (CUSTOM PROCEDURE TRAY) ×1
PACK EP LF (CUSTOM PROCEDURE TRAY) ×1 IMPLANT
PAD DEFIB LIFELINK (PAD) ×2 IMPLANT
PIN PLUG IS-1 DEFIB (PIN) ×2 IMPLANT
SHEATH CLASSIC 7F (SHEATH) ×2 IMPLANT
SHEATH CLASSIC 9.5F (SHEATH) ×2 IMPLANT
SHEATH CLASSIC 9F (SHEATH) ×2 IMPLANT
SHEATH PINNACLE 8F 10CM (SHEATH) ×2 IMPLANT
SLITTER 6232ADJ (MISCELLANEOUS) ×2 IMPLANT
TRAY PACEMAKER INSERTION (PACKS) ×2 IMPLANT
WIRE ACUITY WHISPER EDS 4648 (WIRE) ×2 IMPLANT

## 2017-09-07 NOTE — Progress Notes (Signed)
Site area: rt groin fv sheath Site Prior to Removal:  Level 0 Pressure Applied For: 10 minutes Manual:   yes Patient Status During Pull:  stable Post Pull Site:  Level 0 Post Pull Instructions Given:  yes Post Pull Pulses Present: palpable Dressing Applied:  Gauze and tegaderm Bedrest begins @ 2620 Comments: IV saline locked

## 2017-09-07 NOTE — Interval H&P Note (Signed)
ICD Criteria  Current LVEF:25%. Within 12 months prior to implant: Yes   Heart failure history: Yes, Class III  Cardiomyopathy history: Yes, Non-Ischemic Cardiomyopathy.  Atrial Fibrillation/Atrial Flutter: Yes, Permanent.  Ventricular tachycardia history: Yes, Hemodynamic instability present. VT Type: Sustained Ventricular Tachycardia - Monomorphic.  Cardiac arrest history: Yes, Ventricular Tachycardia.  History of syndromes with risk of sudden death: No.  Previous ICD: No.  Current ICD indication: Secondary  PPM indication: Yes. Pacing type: Ventricular. Greater than 40% RV pacing requirement anticipated. Indication: Complete Heart Block   Beta Blocker therapy for 3 or more months: Yes, prescribed.   Ace Inhibitor/ARB therapy for 3 or more months: Yes, prescribed.   History and Physical Interval Note:  09/07/2017 2:35 PM  Cheryl Vargas  has presented today for surgery, with the diagnosis of VT  The various methods of treatment have been discussed with the patient and family. After consideration of risks, benefits and other options for treatment, the patient has consented to  Procedure(s): BIV ICD INSERTION CRT-D (N/A) AV NODE ABLATION (N/A) as a surgical intervention .  The patient's history has been reviewed, patient examined, no change in status, stable for surgery.  I have reviewed the patient's chart and labs.  Questions were answered to the patient's satisfaction.     Cheryl Vargas

## 2017-09-07 NOTE — H&P (View-Only) (Signed)
Electrophysiology Rounding Note  Patient Name: Cheryl Vargas Date of Encounter: 09/07/2017  Primary Cardiologist: Aundra Dubin Electrophysiologist: Magnolia Mattila   Subjective   C/w some chest wall soreness, no CP, anxious yesterday and nauseous, feeling better today.  Inpatient Medications    Scheduled Meds: . bisoprolol  10 mg Oral Daily  . insulin aspart  2-6 Units Subcutaneous Q4H  . mouth rinse  15 mL Mouth Rinse BID  . mexiletine  200 mg Oral Q12H  . zolpidem  5 mg Oral QHS   Continuous Infusions: . sodium chloride     PRN Meds: sodium chloride, acetaminophen, fentaNYL (SUBLIMAZE) injection, ondansetron (ZOFRAN) IV   Vital Signs    Vitals:   09/07/17 0018 09/07/17 0407 09/07/17 0700 09/07/17 0834  BP: 90/72 115/80  (!) 117/92  Pulse: (!) 103 (!) 108  (!) 109  Resp: (!) 23 19  18   Temp: 98 F (36.7 C) 98 F (36.7 C)  97.6 F (36.4 C)  TempSrc: Axillary Oral  Oral  SpO2: 92% 97%  98%  Weight:   148 lb 13 oz (67.5 kg)   Height:        Intake/Output Summary (Last 24 hours) at 09/07/2017 0853 Last data filed at 09/06/2017 1700 Gross per 24 hour  Intake 240 ml  Output 400 ml  Net -160 ml   Filed Weights   09/05/17 0448 09/06/17 0426 09/07/17 0700  Weight: 152 lb 12.5 oz (69.3 kg) 159 lb 6.3 oz (72.3 kg) 148 lb 13 oz (67.5 kg)    Physical Exam    GEN- The patient is elderly appearing, alert and oriented x 3 today.   Head- normocephalic, atraumatic Eyes-  Sclera clear, conjunctiva pink Ears- hearing intact Oropharynx- clear Neck- supple Lungs- CTA b/l, normal work of breathing Heart- iRRR, tachycardic  GI- soft, NT, ND Extremities- no clubbing, cyanosis, or edema Skin- no rash or lesion Psych- euthymic mood, full affect Neuro- strength and sensation are intact  Labs    CBC Recent Labs    09/05/17 0423 09/07/17 0241  WBC 9.5 10.0  HGB 13.7 14.4  HCT 38.5 42.0  MCV 90.2 90.9  PLT 146* 259   Basic Metabolic Panel Recent Labs    09/06/17 0321  09/07/17 0718  NA 136 137  K 3.5 4.2  CL 106 106  CO2 23 21*  GLUCOSE 94 95  BUN 18 17  CREATININE 0.88 0.80  CALCIUM 7.8* 8.2*  MG 1.9 1.8  Cardiac Enzymes No results for input(s): CKTOTAL, CKMB, CKMBINDEX, TROPONINI in the last 72 hours.   Telemetry    AF 90's-120's, generally low 100's (personally reviewed), unchanged  Radiology    Dg Chest Port 1 View Result Date: 09/05/2017 CLINICAL DATA:  Pulmonary edema. EXAM: PORTABLE CHEST 1 VIEW COMPARISON:  09/04/2017. FINDINGS: Cardiomegaly with normal pulmonary vascularity. Dense progressed right perihilar right base atelectasis/ infiltrate. Increased right pleural effusion. Small left pleural effusion again noted. No pneumothorax. IMPRESSION: 1. Dense progressive right perihilar right base atelectasis/ infiltrate and progressive right pleural effusion. 2. Persistent left base atelectasis and infiltrate and small left pleural effusion unchanged. Electronically Signed   By: Marcello Moores  Register   On: 09/05/2017 07:47     Patient Profile     Cheryl Vargas is a 82 y.o. female with a past medical history significant for persistent AF, chronic diastolic heart failure, at least moderate MR.  She was admitted for symptomatic WCT felt to be VT.   Assessment & Plan    1.  WCT Felt to be VT by Dr Curt Bears and Dr Lovena Le Hemodynamically unstable and required cardioversion on admission EF is down by echo this admission - ?2/2 stunning Cath 2017 with no significant CAD Keep K >3.9, Mg >1.8 Dr Curt Bears discussed treatment options with patient yesterday, unfortunately she is intolerant of amiodarone 2/2 nausea. EKG post sotalol with QT prolongation and sotalol stopped, Mexiletine started today, unfortunately this will not help with her AF  Will hold NPO after MN, heparin gtt stopped this morning, Dr. Rayann Heman will discuss later this morning management options/recommendations.   2.  Persistent atrial fibrillation MAZE/MVR has been recommended by  Dr Rayann Heman. She has seen Dr Roxy Manns earlier this month out patienty and at that time, not entirely on board with surgical intervention and wanted to give it more thought CHA2DS2Vasc is 4, on [place] out patient Rate not well controlled, pending discussion with patient for further management  3.  LV dysfunction EF down from previous studies ? 2/2 stunning from VT Will follow Remains euvolemic on exam s/p IV lasix  4.  OOB yesterday for fist time     Felt weak, anxious, nauseous     Better this AM    Signed, Baldwin Jamaica, PA-C  09/07/2017, 8:53 AM   I have seen, examined the patient, and reviewed the above assessment and plan.  Changes to above are made where necessary.  On exam, tachycardic irregular rhythm.  She was admitted with hemodynamically unstable VT requiring resuscitation and cardioversion emergently.  She has been in afib with elevated V rates, refractory to medical therapy.  She tolerates medicines poorly.  V rates have been 120s this entire admission.  I have stopped IV heparin today and will resume oral anticoagulation when able.  She has been placed on mexiletine by Dr Curt Bears for her VT. I had a long discussion with the patient and have also discussed case with Dr Aundra Dubin who knows the patient well.  Options are to proceed with MAZE and then to follow with an ICD or to proceed with BiV ICD and AV nodal ablation.  She is very clear that she is not interested in surgical MAZE.  She would much prefer BiV ICD with AV nodal ablation. At this time, she meets criteria for ICD implantation for secondary prevention of sudden death.  I have had a thorough discussion with the patient and her son reviewing options.  The patient and her son have had opportunities to ask questions and have them answered. The patient and I have decided together through a shared decision making process to proceed with BiV ICD and AV nodal ablation at this time.   Risks, benefits, alternatives to BiV ICD implantation  with AV nodal ablation were discussed in detail with the patient today. The patient understands that the risks include but are not limited to bleeding, infection, pneumothorax, perforation, tamponade, vascular damage, renal failure, MI, stroke, death, inappropriate shocks, and lead dislodgement and wishes to proceed.   Co Sign: [name], MD 09/07/2017 11:03 AM

## 2017-09-07 NOTE — Care Management Important Message (Signed)
Important Message  Patient Details  Name: Cheryl Vargas MRN: [id number] Date of Birth: Jan 16, 1935   Medicare Important Message Given:  Yes    Nathen May 09/07/2017, 9:36 AM

## 2017-09-07 NOTE — Progress Notes (Signed)
Ativan ordered from pharmacy unused.  Instructed by pharmacy to return in person.  Returned to pharmacy and delivered to [employer], Occupational psychologist.  Claudette Stapler, RN

## 2017-09-07 NOTE — Progress Notes (Signed)
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 HR 242, A.fib vs Vtach. Adenosine administered x 2 without relief. Cardioverted. First attempt was not successful and patient went into pulseless VT. Second attempt successful. Loaded with amiodarone. Placed on Bipap for hypoxia. CXR with pulmonary edema. Given 40 meq lasix. Cardiology consulted. PCCM asked to admit.   82 year old woman with past medical history of atrial fibrillation that has been difficult to control presented to the emergency department with a heart rate greater than 200. Adenosine was unsuccessful in converting the rhythm at which time she was given a synchronized cardioversion. After synchronous cardioversion she went into ventricular tachycardia without a pulse and required ACLS in 4-5 minutes of CPR with defibrillation. After return of spontaneous circulation she became alert. She subsequently developed hypoxia in the setting of pulmonary edema and was placed on BiPAP. She was evaluated by cardiology and recommended beta-blocker, Lasix, amiodarone.    Subjective: 12/28 patient off floor to cardiac lab. No charge    Assessment & Plan:   Active Problems:   A-fib (HCC)   Acute respiratory failure with hypoxia/ pulmonary edema  -12/25 CXR w/ bilateral pleural effusions  -Titrate O2 to maintain SPO2> 93% -12/28 off floor to cardiac lab No charge  Acute Combined Systolic/Diastolic CHF (TEE on 87/56 EF 20-25)   -Strict in and out -Daily weight -Medications per cardiology -12/28 off floor to cardiac lab No charge  Cardiac arrest  /Pulseless VT arrest 3-5 minutes s/p cardioversion  -Scheduled ICD placement 12/28? -12/28 off floor to cardiac lab No charge  Recurring A.Fib RVR s/p Multiple Cardioversion > - Followed  outpatient by EP Dr. Rayann Heman and Cardiology by [facility] -MAZE vs MVR. Patient still considering -Rate still not well-controlled -Medication increases per cardiology  -12/28 off floor to cardiac lab No charge  Severe MR   -see A. fib RVR       DVT prophylaxis: Heparin drip Code Status: Full Family Communication: Family at bedside discussion of plan of care Disposition Plan: TBD   Consultants:  Cardiology    Procedures/Significant Events:  CXR 12/24 > Vascular congestion with hazy lung opacities, right greater than left, consistent with asymmetric pulmonary edema     I have personally reviewed and interpreted all radiology studies and my findings are as above.  VENTILATOR SETTINGS:    Cultures   Antimicrobials: Anti-infectives (From admission, onward)   None       Devices    LINES / TUBES:      Continuous Infusions: . sodium chloride    . heparin 800 Units/hr (09/06/17 1451)     Objective: Vitals:   09/06/17 2058 09/07/17 0000 09/07/17 0018 09/07/17 0407  BP:   90/72 115/80  Pulse:   (!) 103 (!) 108  Resp:   (!) 23 19  Temp:   98 F (36.7 C) 98 F (36.7 C)  TempSrc:   Axillary Oral  SpO2: 94% 94% 92% 97%  Weight:      Height:        Intake/Output Summary (Last 24 hours) at 09/07/2017 0715 Last data filed at 09/06/2017 1700 Gross per 24 hour  Intake 600 ml  Output 400 ml  Net 200 ml   Filed Weights   09/04/17 0500 09/05/17 0448 09/06/17 0426  Weight: 154 lb 15.7 oz (70.3 kg) 152 lb 12.5 oz (69.3 kg) 159 lb 6.3 oz (72.3 kg)    Physical Exam:  12/28 off floor to cardiac lab No charge   .     Data Reviewed: Care during the described time interval was provided by me .  I have reviewed this patient's available data, including medical history,  events of note, physical examination, and all test results as part of my evaluation.   CBC: Recent Labs  Lab 09/03/17 1709 09/03/17 1711 09/04/17 0434 09/05/17 0423 09/07/17 0241  WBC 7.8  --  11.3* 9.5 10.0  NEUTROABS 4.0  --   --   --   --   HGB 14.2 14.3 14.7 13.7 14.4  HCT 41.6 42.0 42.0 38.5 42.0  MCV 92.4  --  90.7 90.2 90.9  PLT 177  --  181 146* 001   Basic Metabolic Panel: Recent Labs  Lab 09/03/17 1709 09/03/17 1711 09/04/17 0434 09/05/17 0423 09/06/17 0321  NA 136 142 138 135 136  K 4.8 4.9 3.7 3.7 3.5  CL 112* 109 108 105 106  CO2 17*  --  19* 21* 23  GLUCOSE 164* 169* 158* 93 94  BUN 19 27* 17 19 18   CREATININE 0.98 0.90 0.97 1.01* 0.88  CALCIUM 7.7*  --  8.1* 7.9* 7.8*  MG 2.0  --  1.9  --  1.9  PHOS  --   --  3.3  --   --    GFR: Estimated Creatinine Clearance: 50.8 mL/min (by C-G formula based on SCr of 0.88 mg/dL). Liver Function Tests: No results for input(s): AST, ALT, ALKPHOS, BILITOT, PROT, ALBUMIN in the last 168 hours. No results for input(s): LIPASE, AMYLASE in the last 168 hours. No results for input(s): AMMONIA in the last 168 hours. Coagulation Profile: No results for input(s): INR, PROTIME in the last 168 hours. Cardiac Enzymes: Recent Labs  Lab 09/04/17 0012  TROPONINI 0.19*   BNP (last 3 results) No results for input(s): PROBNP in the last 8760 hours. HbA1C: No results for input(s): HGBA1C in the last 72 hours. CBG: Recent Labs  Lab 09/06/17 1138 09/06/17 1745 09/06/17 2128 09/06/17 2358 09/07/17 0406  GLUCAP 89 122* 113* 118* 113*   Lipid Profile: No results for input(s): CHOL, HDL, LDLCALC, TRIG, CHOLHDL, LDLDIRECT in the last 72 hours. Thyroid Function Tests: No results for input(s): TSH, T4TOTAL, FREET4, T3FREE, THYROIDAB in the last 72 hours. Anemia Panel: No results for input(s): VITAMINB12, FOLATE, FERRITIN, TIBC, IRON, RETICCTPCT in the last 72 hours. Urine analysis:    Component Value Date/Time    COLORURINE YELLOW 04/23/2016 2008   APPEARANCEUR CLOUDY (A) 04/23/2016 2008   LABSPEC 1.016 04/23/2016 2008   PHURINE 5.0 04/23/2016 2008   GLUCOSEU NEGATIVE 04/23/2016 2008   HGBUR SMALL (A) 04/23/2016 2008   BILIRUBINUR NEGATIVE 04/23/2016 2008   KETONESUR NEGATIVE 04/23/2016 2008   PROTEINUR NEGATIVE 04/23/2016 2008   UROBILINOGEN 0.2 03/17/2013 1423   NITRITE NEGATIVE 04/23/2016 2008   LEUKOCYTESUR MODERATE (A) 04/23/2016 2008   Sepsis Labs: @LABRCNTIP (procalcitonin:4,lacticidven:4)  ) Recent Results (from the past 240 hour(s))  MRSA PCR Screening     Status: None   Collection Time: 09/03/17 10:53 PM  Result Value Ref Range Status   MRSA by PCR NEGATIVE NEGATIVE Final    Comment:        The  GeneXpert MRSA Assay (FDA approved for NASAL specimens only), is one component of a comprehensive MRSA colonization surveillance program. It is not intended to diagnose MRSA infection nor to guide or monitor treatment for MRSA infections.          Radiology Studies: No results found.      Scheduled Meds: . bisoprolol  10 mg Oral Daily  . insulin aspart  2-6 Units Subcutaneous Q4H  . mouth rinse  15 mL Mouth Rinse BID  . mexiletine  200 mg Oral Q12H  . zolpidem  5 mg Oral QHS   Continuous Infusions: . sodium chloride    . heparin 800 Units/hr (09/06/17 1451)     LOS: 4 days    Time spent: 40 minutes    Kyandra Mcclaine, Geraldo Docker, MD [facility] Pager [phone]   If 7PM-7AM, please contact night-coverage [email or web address] Password [email or web address] 09/07/2017, 7:15 AM

## 2017-09-07 NOTE — Progress Notes (Signed)
Electrophysiology Rounding Note  Patient Name: Cheryl Vargas Date of Encounter: 09/07/2017  Primary Cardiologist: Aundra Dubin Electrophysiologist: Karlisha Mathena   Subjective   C/w some chest wall soreness, no CP, anxious yesterday and nauseous, feeling better today.  Inpatient Medications    Scheduled Meds: . bisoprolol  10 mg Oral Daily  . insulin aspart  2-6 Units Subcutaneous Q4H  . mouth rinse  15 mL Mouth Rinse BID  . mexiletine  200 mg Oral Q12H  . zolpidem  5 mg Oral QHS   Continuous Infusions: . sodium chloride     PRN Meds: sodium chloride, acetaminophen, fentaNYL (SUBLIMAZE) injection, ondansetron (ZOFRAN) IV   Vital Signs    Vitals:   09/07/17 0018 09/07/17 0407 09/07/17 0700 09/07/17 0834  BP: 90/72 115/80  (!) 117/92  Pulse: (!) 103 (!) 108  (!) 109  Resp: (!) 23 19  18   Temp: 98 F (36.7 C) 98 F (36.7 C)  97.6 F (36.4 C)  TempSrc: Axillary Oral  Oral  SpO2: 92% 97%  98%  Weight:   148 lb 13 oz (67.5 kg)   Height:        Intake/Output Summary (Last 24 hours) at 09/07/2017 0853 Last data filed at 09/06/2017 1700 Gross per 24 hour  Intake 240 ml  Output 400 ml  Net -160 ml   Filed Weights   09/05/17 0448 09/06/17 0426 09/07/17 0700  Weight: 152 lb 12.5 oz (69.3 kg) 159 lb 6.3 oz (72.3 kg) 148 lb 13 oz (67.5 kg)    Physical Exam    GEN- The patient is elderly appearing, alert and oriented x 3 today.   Head- normocephalic, atraumatic Eyes-  Sclera clear, conjunctiva pink Ears- hearing intact Oropharynx- clear Neck- supple Lungs- CTA b/l, normal work of breathing Heart- iRRR, tachycardic  GI- soft, NT, ND Extremities- no clubbing, cyanosis, or edema Skin- no rash or lesion Psych- euthymic mood, full affect Neuro- strength and sensation are intact  Labs    CBC Recent Labs    09/05/17 0423 09/07/17 0241  WBC 9.5 10.0  HGB 13.7 14.4  HCT 38.5 42.0  MCV 90.2 90.9  PLT 146* 453   Basic Metabolic Panel Recent Labs    09/06/17 0321  09/07/17 0718  NA 136 137  K 3.5 4.2  CL 106 106  CO2 23 21*  GLUCOSE 94 95  BUN 18 17  CREATININE 0.88 0.80  CALCIUM 7.8* 8.2*  MG 1.9 1.8  Cardiac Enzymes No results for input(s): CKTOTAL, CKMB, CKMBINDEX, TROPONINI in the last 72 hours.   Telemetry    AF 90's-120's, generally low 100's (personally reviewed), unchanged  Radiology    Dg Chest Port 1 View Result Date: 09/05/2017 CLINICAL DATA:  Pulmonary edema. EXAM: PORTABLE CHEST 1 VIEW COMPARISON:  09/04/2017. FINDINGS: Cardiomegaly with normal pulmonary vascularity. Dense progressed right perihilar right base atelectasis/ infiltrate. Increased right pleural effusion. Small left pleural effusion again noted. No pneumothorax. IMPRESSION: 1. Dense progressive right perihilar right base atelectasis/ infiltrate and progressive right pleural effusion. 2. Persistent left base atelectasis and infiltrate and small left pleural effusion unchanged. Electronically Signed   By: Marcello Moores  Register   On: 09/05/2017 07:47     Patient Profile     Cheryl Vargas is a 82 y.o. female with a past medical history significant for persistent AF, chronic diastolic heart failure, at least moderate MR.  She was admitted for symptomatic WCT felt to be VT.   Assessment & Plan    1.  WCT Felt to be VT by Dr Curt Bears and Dr Lovena Le Hemodynamically unstable and required cardioversion on admission EF is down by echo this admission - ?2/2 stunning Cath 2017 with no significant CAD Keep K >3.9, Mg >1.8 Dr Curt Bears discussed treatment options with patient yesterday, unfortunately she is intolerant of amiodarone 2/2 nausea. EKG post sotalol with QT prolongation and sotalol stopped, Mexiletine started today, unfortunately this will not help with her AF  Will hold NPO after MN, heparin gtt stopped this morning, Dr. Rayann Heman will discuss later this morning management options/recommendations.   2.  Persistent atrial fibrillation MAZE/MVR has been recommended by  Dr Rayann Heman. She has seen Dr Roxy Manns earlier this month out patienty and at that time, not entirely on board with surgical intervention and wanted to give it more thought CHA2DS2Vasc is 4, on [place] out patient Rate not well controlled, pending discussion with patient for further management  3.  LV dysfunction EF down from previous studies ? 2/2 stunning from VT Will follow Remains euvolemic on exam s/p IV lasix  4.  OOB yesterday for fist time     Felt weak, anxious, nauseous     Better this AM    Signed, Baldwin Jamaica, PA-C  09/07/2017, 8:53 AM   I have seen, examined the patient, and reviewed the above assessment and plan.  Changes to above are made where necessary.  On exam, tachycardic irregular rhythm.  She was admitted with hemodynamically unstable VT requiring resuscitation and cardioversion emergently.  She has been in afib with elevated V rates, refractory to medical therapy.  She tolerates medicines poorly.  V rates have been 120s this entire admission.  I have stopped IV heparin today and will resume oral anticoagulation when able.  She has been placed on mexiletine by Dr Curt Bears for her VT. I had a long discussion with the patient and have also discussed case with Dr Aundra Dubin who knows the patient well.  Options are to proceed with MAZE and then to follow with an ICD or to proceed with BiV ICD and AV nodal ablation.  She is very clear that she is not interested in surgical MAZE.  She would much prefer BiV ICD with AV nodal ablation. At this time, she meets criteria for ICD implantation for secondary prevention of sudden death.  I have had a thorough discussion with the patient and her son reviewing options.  The patient and her son have had opportunities to ask questions and have them answered. The patient and I have decided together through a shared decision making process to proceed with BiV ICD and AV nodal ablation at this time.   Risks, benefits, alternatives to BiV ICD implantation  with AV nodal ablation were discussed in detail with the patient today. The patient understands that the risks include but are not limited to bleeding, infection, pneumothorax, perforation, tamponade, vascular damage, renal failure, MI, stroke, death, inappropriate shocks, and lead dislodgement and wishes to proceed.   Co Sign: [name], MD 09/07/2017 11:03 AM

## 2017-09-07 NOTE — Progress Notes (Signed)
Patient went down for ICD procedure. Received notification that patient would not be returning to the unit.

## 2017-09-08 ENCOUNTER — Inpatient Hospital Stay (HOSPITAL_COMMUNITY): Payer: PPO

## 2017-09-08 DIAGNOSIS — R112 Nausea with vomiting, unspecified: Secondary | ICD-10-CM

## 2017-09-08 DIAGNOSIS — Z959 Presence of cardiac and vascular implant and graft, unspecified: Secondary | ICD-10-CM

## 2017-09-08 LAB — CBC
HCT: 41.2 % (ref 36.0–46.0)
Hemoglobin: 13.9 g/dL (ref 12.0–15.0)
MCH: 30.8 pg (ref 26.0–34.0)
MCHC: 33.7 g/dL (ref 30.0–36.0)
MCV: 91.2 fL (ref 78.0–100.0)
Platelets: 196 10*3/uL (ref 150–400)
RBC: 4.52 MIL/uL (ref 3.87–5.11)
RDW: 13.8 % (ref 11.5–15.5)
WBC: 8.7 10*3/uL (ref 4.0–10.5)

## 2017-09-08 LAB — MAGNESIUM: Magnesium: 1.9 mg/dL (ref 1.7–2.4)

## 2017-09-08 LAB — BASIC METABOLIC PANEL
Anion gap: 12 (ref 5–15)
BUN: 25 mg/dL — ABNORMAL HIGH (ref 6–20)
CO2: 20 mmol/L — ABNORMAL LOW (ref 22–32)
Calcium: 8.5 mg/dL — ABNORMAL LOW (ref 8.9–10.3)
Chloride: 104 mmol/L (ref 101–111)
Creatinine, Ser: 0.98 mg/dL (ref 0.44–1.00)
GFR calc Af Amer: >60 mL/min (ref >60)
GFR calc non Af Amer: 53 mL/min — ABNORMAL LOW (ref >60)
Glucose, Bld: 99 mg/dL (ref 65–99)
Potassium: 4.5 mmol/L (ref 3.5–5.1)
Sodium: 136 mmol/L (ref 135–145)

## 2017-09-08 LAB — GLUCOSE, CAPILLARY
Glucose-Capillary: 94 mg/dL (ref 65–99)
Glucose-Capillary: 99 mg/dL (ref 65–99)
Glucose-Capillary: 175 mg/dL — ABNORMAL HIGH (ref 65–99)

## 2017-09-08 LAB — TROPONIN I
Troponin I: 0.16 ng/mL (ref <0.03)
Troponin I: 0.17 ng/mL (ref <0.03)
Troponin I: 0.13 ng/mL (ref <0.03)

## 2017-09-08 MED ORDER — SODIUM CHLORIDE 0.9 % IV SOLN
12.5000 mg | Freq: Once | INTRAVENOUS | Status: AC
  Administered 2017-09-08: 12.5 mg via INTRAVENOUS
  Filled 2017-09-08: qty 0.5

## 2017-09-08 MED ORDER — SODIUM CHLORIDE 0.9 % IV BOLUS (SEPSIS)
250.0000 mL | Freq: Once | INTRAVENOUS | Status: AC
  Administered 2017-09-08: 250 mL via INTRAVENOUS

## 2017-09-08 MED ORDER — CALCIUM CARBONATE ANTACID 500 MG PO CHEW
1.0000 | CHEWABLE_TABLET | ORAL | Status: DC | PRN
  Administered 2017-09-08: 200 mg via ORAL
  Filled 2017-09-08 (×2): qty 2

## 2017-09-08 MED ORDER — OFF THE BEAT BOOK
Freq: Once | Status: AC
  Administered 2017-09-08: 1
  Filled 2017-09-08: qty 1

## 2017-09-08 MED ORDER — FUROSEMIDE 40 MG PO TABS
40.0000 mg | ORAL_TABLET | Freq: Every day | ORAL | Status: DC
  Administered 2017-09-08: 40 mg via ORAL
  Filled 2017-09-08: qty 1

## 2017-09-08 MED ORDER — INSULIN ASPART 100 UNIT/ML ~~LOC~~ SOLN
2.0000 [IU] | Freq: Three times a day (TID) | SUBCUTANEOUS | Status: DC
Start: 2017-09-08 — End: 2017-09-08

## 2017-09-08 NOTE — Progress Notes (Signed)
Progress Note   Subjective   Pt with nausea overnight.  Denies CP or SOB.  + restless with difficulty sleeping.  Inpatient Medications    Scheduled Meds: . bisoprolol  10 mg Oral Daily  . insulin aspart  2-6 Units Subcutaneous Q4H  . mexiletine  200 mg Oral Q12H  . pantoprazole  40 mg Oral Daily  . zolpidem  5 mg Oral QHS   Continuous Infusions:  PRN Meds: acetaminophen, calcium carbonate, HYDROcodone-acetaminophen, ondansetron (ZOFRAN) IV, promethazine   Vital Signs    Vitals:   09/07/17 2022 09/07/17 2335 09/08/17 0350 09/08/17 0755  BP: 115/66 115/67 132/71 133/78  Pulse: 80  71 80  Resp: 17  (!) 21 20  Temp: (!) 97.4 F (36.3 C) 97.6 F (36.4 C) 98.1 F (36.7 C) 97.6 F (36.4 C)  TempSrc: Oral Oral Oral Oral  SpO2: 95% 92% 94% 94%  Weight:   160 lb 7.9 oz (72.8 kg)   Height:        Intake/Output Summary (Last 24 hours) at 09/08/2017 0817 Last data filed at 09/07/2017 1600 Gross per 24 hour  Intake 0 ml  Output 100 ml  Net -100 ml   Filed Weights   09/06/17 0426 09/07/17 0700 09/08/17 0350  Weight: 159 lb 6.3 oz (72.3 kg) 148 lb 13 oz (67.5 kg) 160 lb 7.9 oz (72.8 kg)    Telemetry    afib with V pacing - Personally Reviewed  Physical Exam   GEN- The patient is frail and ill appearing, alert and oriented x 3 today.   Head- normocephalic, atraumatic Eyes-  Sclera clear, conjunctiva pink Ears- hearing intact Oropharynx- clear Neck- supple, Lungs- Clear to ausculation bilaterally, normal work of breathing Heart- Regular rate and rhythm (paced) GI- soft, NT, ND, + BS Extremities- no clubbing, cyanosis, or edema  MS- diffuse muscle atrophy Skin- no rash or lesion, no device pocket hematoma Psych- euthymic mood, full affect Neuro- strength and sensation are intact   Labs    Chemistry Recent Labs  Lab 09/06/17 0321 09/07/17 0718 09/08/17 0347  NA 136 137 136  K 3.5 4.2 4.5  CL 106 106 104  CO2 23 21* 20*  GLUCOSE 94 95 99  BUN 18 17  25*  CREATININE 0.88 0.80 0.98  CALCIUM 7.8* 8.2* 8.5*  GFRNONAA >60 >60 53*  GFRAA >60 >60 >60  ANIONGAP 7 10 12      Hematology Recent Labs  Lab 09/05/17 0423 09/07/17 0241 09/08/17 0347  WBC 9.5 10.0 8.7  RBC 4.27 4.62 4.52  HGB 13.7 14.4 13.9  HCT 38.5 42.0 41.2  MCV 90.2 90.9 91.2  MCH 32.1 31.2 30.8  MCHC 35.6 34.3 33.7  RDW 14.0 13.9 13.8  PLT 146* 190 196    Cardiac Enzymes Recent Labs  Lab 09/04/17 0012  TROPONINI 0.19*    Recent Labs  Lab 09/03/17 1709  TROPIPOC 0.03        Assessment & Plan    1.  Complete heart block CXR reveals stable leads, no ptx Device interrogation is reviewed and normal  2. Ventricular tachycardia She thinks her nausea may be due to mexiletine.  I will stop mexiletine for now and observe, though I am doubtful that this is the cause.  ICD now in place No driving x 6 months  3. Permanent afib Will reassess ICD pocket in am.  Keep anticoagulation on hold for now.  4. Acute on chronic systolic dysfunction Hopefully will improve with better rate control  s/p AV nodal ablation Continue gentle diuresis.  EP to follow with you over the weekend. Would ask PT to assess  Hopefully discharge to home soon  [name] MD, [facility] 09/08/2017 8:17 AM

## 2017-09-08 NOTE — Progress Notes (Signed)
Pt received from [facility]. Family at bedside. Pt oriented to room and equipment. Pt denies pain or nausea at this time. Was able to eat a small amount for lunch. Telemetry applied, CCMD notified. Pt BP low 86/58 : pt and son states this has happened in the past : pt denies dizziness or lightheadedness. Will recheck BP in 30 minutes to 1 hour.  Fritz Pickerel, RN

## 2017-09-08 NOTE — Progress Notes (Signed)
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 HR 242, A.fib vs Vtach. Adenosine administered x 2 without relief. Cardioverted. First attempt was not successful and patient went into pulseless VT. Second attempt successful. Loaded with amiodarone. Placed on Bipap for hypoxia. CXR with pulmonary edema. Given 40 meq lasix. Cardiology consulted. PCCM asked to admit.   82 year old woman with past medical history of atrial fibrillation that has been difficult to control presented to the emergency department with a heart rate greater than 200. Adenosine was unsuccessful in converting the rhythm at which time she was given a synchronized cardioversion. After synchronous cardioversion she went into ventricular tachycardia without a pulse and required ACLS in 4-5 minutes of CPR with defibrillation. After return of spontaneous circulation she became alert. She subsequently developed hypoxia in the setting of pulmonary edema and was placed on BiPAP. She was evaluated by cardiology and recommended beta-blocker, Lasix, amiodarone.    Subjective: 12/29 A/O 4, negative CP, negative SOB, negative abdominal pain. Positive nausea not relieved with Zofran or Phenergan. Patient believes secondary to Mexiletine, which is why she refused medication last night. However currently nauseated. Discussed case with Dr. Rayann Heman cardiology patient was nauseated when she was taken down to cath lab yesterday again not relieved with anti-mimetic and anti-anxiety medication.     Assessment & Plan:   Active Problems:   A-fib  (HCC)   Acute Hypoxic Respiratory Failure in setting of pulmonary edema  -12/25 CXR w/ bilateral pleural effusions  -Titrate O2 to maintain SPO2> 93%  Combined Systolic/Diastolic CHF (TEE on 23/55 EF 20-25)   -Strict in and out since admission -937ml -Daily weight Filed Weights   09/06/17 0426 09/07/17 0700 09/08/17 0350  Weight: 159 lb 6.3 oz (72.3 kg) 148 lb 13 oz (67.5 kg) 160 lb 7.9 oz (72.8 kg)  -Bisoprolol 10 mg daily -Lasix 40 mg daily -Cardiology adjusting medication  Pulseless VT arrest 3-5 minutes s/p cardioversion  -12/28 S/P ICD placement Scheduled ICD placement   Permanent A.Fib RVR  -s/p Multiple Cardioversion  - Followed  outpatient by EP Dr. [name] and Cardiology by Dr Aundra Dubin -MAZE vs MVR. Patient still considering -Rate still not well-controlled -Anticoagulation on hold per cardiology -Medication increases per cardiology   Severe MR   -see A. fib RVR  Refractory nausea -Cycle cardiac enzymes -DCed Mexiletine per cardiology patient feels may be causing her nausea. -Thorazine IV 12.5mg  x1        DVT prophylaxis: SCD Code Status: Full Family Communication: Family at bedside discussion of plan of care Disposition Plan: TBD   Consultants:  Cardiology EP   Procedures/Significant Events:  CXR 12/24 > Vascular congestion with hazy lung opacities, right greater than left, consistent with asymmetric pulmonary edema  Echocardiogram: LVEF= 25% to 30%. Diffuse hypokinesis. - Ventricular septum: Septal motion showed abnormal function and dyssynergy. - Mitral valve: moderate regurgitation. - Left atrium:  severely dilated.  - Right atrium: moderately dilated. - Tricuspid valve: moderate regurgitation.      I have personally reviewed and interpreted all radiology studies and my findings are as above.  VENTILATOR SETTINGS:    Cultures   Antimicrobials: Anti-infectives (From  admission, onward)   Start     Stop   09/07/17 1800  ceFAZolin  (ANCEF) IVPB 1 g/50 mL premix     09/08/17 0646   09/07/17 1130  gentamicin (GARAMYCIN) 80 mg in sodium chloride irrigation 0.9 % 500 mL irrigation     09/07/17 1350   09/07/17 1130  ceFAZolin (ANCEF) IVPB 2g/100 mL premix     09/07/17 1241       Devices    LINES / TUBES:      Continuous Infusions:    Objective: Vitals:   09/07/17 2022 09/07/17 2335 09/08/17 0350 09/08/17 0755  BP: 115/66 115/67 132/71 133/78  Pulse: 80  71 80  Resp: 17  (!) 21 20  Temp: (!) 97.4 F (36.3 C) 97.6 F (36.4 C) 98.1 F (36.7 C) 97.6 F (36.4 C)  TempSrc: Oral Oral Oral Oral  SpO2: 95% 92% 94% 94%  Weight:   160 lb 7.9 oz (72.8 kg)   Height:        Intake/Output Summary (Last 24 hours) at 09/08/2017 0839 Last data filed at 09/07/2017 1600 Gross per 24 hour  Intake 0 ml  Output 100 ml  Net -100 ml   Filed Weights   09/06/17 0426 09/07/17 0700 09/08/17 0350  Weight: 159 lb 6.3 oz (72.3 kg) 148 lb 13 oz (67.5 kg) 160 lb 7.9 oz (72.8 kg)    Physical Exam:  General: A/O 4, No acute respiratory distress Neck:  Negative scars, masses, torticollis, lymphadenopathy, JVD Lungs: Clear to auscultation bilaterally without wheezes or crackles Cardiovascular: Regular rate and rhythm without murmur gallop or rub normal S1 and S2, left chest wall ICD in place incision site covered and clean no sign of bleeding/infection.  Abdomen: negative abdominal pain, nondistended, positive soft, bowel sounds, no rebound, no ascites, no appreciable mass Extremities: No significant cyanosis, clubbing, or edema bilateral lower extremities Skin: Negative rashes, lesions, ulcers Psychiatric:  Negative depression, negative anxiety, negative fatigue, negative mania  Central nervous system:  Cranial nerves II through XII intact, tongue/uvula midline, all extremities muscle strength 5/5, sensation intact throughout, negative dysarthria, negative expressive aphasia, negative receptive aphasia. .     Data  Reviewed: Care during the described time interval was provided by me .  I have reviewed this patient's available data, including medical history, events of note, physical examination, and all test results as part of my evaluation.   CBC: Recent Labs  Lab 09/03/17 1709 09/03/17 1711 09/04/17 0434 09/05/17 0423 09/07/17 0241 09/08/17 0347  WBC 7.8  --  11.3* 9.5 10.0 8.7  NEUTROABS 4.0  --   --   --   --   --   HGB 14.2 14.3 14.7 13.7 14.4 13.9  HCT 41.6 42.0 42.0 38.5 42.0 41.2  MCV 92.4  --  90.7 90.2 90.9 91.2  PLT 177  --  181 146* 190 962   Basic Metabolic Panel: Recent Labs  Lab 09/03/17 1709  09/04/17 0434 09/05/17 0423 09/06/17 0321 09/07/17 0718 09/08/17 0347  NA 136   < > 138 135 136 137 136  K 4.8   < > 3.7 3.7 3.5 4.2 4.5  CL 112*   < > 108 105 106 106 104  CO2 17*  --  19* 21* 23 21* 20*  GLUCOSE 164*   < > 158* 93 94 95 99  BUN 19   < > 17 19 18 17  25*  CREATININE 0.98   < > 0.97 1.01* 0.88 0.80 0.98  CALCIUM 7.7*  --  8.1* 7.9* 7.8* 8.2* 8.5*  MG 2.0  --  1.9  --  1.9 1.8 1.9  PHOS  --   --  3.3  --   --   --   --    < > = values in this interval not displayed.   GFR: Estimated Creatinine Clearance: 45.8 mL/min (by C-G formula based on SCr of 0.98 mg/dL). Liver Function Tests: No results for input(s): AST, ALT, ALKPHOS, BILITOT, PROT, ALBUMIN in the last 168 hours. No results for input(s): LIPASE, AMYLASE in the last 168 hours. No results for input(s): AMMONIA in the last 168 hours. Coagulation Profile: No results for input(s): INR, PROTIME in the last 168 hours. Cardiac Enzymes: Recent Labs  Lab 09/04/17 0012  TROPONINI 0.19*   BNP (last 3 results) No results for input(s): PROBNP in the last 8760 hours. HbA1C: No results for input(s): HGBA1C in the last 72 hours. CBG: Recent Labs  Lab 09/07/17 0406 09/07/17 0829 09/07/17 1741 09/07/17 2118 09/08/17 0614  GLUCAP 113* 83 106* 133* 94   Lipid Profile: No results for input(s): CHOL, HDL,  LDLCALC, TRIG, CHOLHDL, LDLDIRECT in the last 72 hours. Thyroid Function Tests: No results for input(s): TSH, T4TOTAL, FREET4, T3FREE, THYROIDAB in the last 72 hours. Anemia Panel: No results for input(s): VITAMINB12, FOLATE, FERRITIN, TIBC, IRON, RETICCTPCT in the last 72 hours. Urine analysis:    Component Value Date/Time   COLORURINE YELLOW 04/23/2016 2008   APPEARANCEUR CLOUDY (A) 04/23/2016 2008   LABSPEC 1.016 04/23/2016 2008   PHURINE 5.0 04/23/2016 2008   GLUCOSEU NEGATIVE 04/23/2016 2008   HGBUR SMALL (A) 04/23/2016 2008   BILIRUBINUR NEGATIVE 04/23/2016 2008   KETONESUR NEGATIVE 04/23/2016 2008   PROTEINUR NEGATIVE 04/23/2016 2008   UROBILINOGEN 0.2 03/17/2013 1423   NITRITE NEGATIVE 04/23/2016 2008   LEUKOCYTESUR MODERATE (A) 04/23/2016 2008   Sepsis Labs: @LABRCNTIP (procalcitonin:4,lacticidven:4)  ) Recent Results (from the past 240 hour(s))  MRSA PCR Screening     Status: None   Collection Time: 09/03/17 10:53 PM  Result Value Ref Range Status   MRSA by PCR NEGATIVE NEGATIVE Final    Comment:        The GeneXpert MRSA Assay (FDA approved for NASAL specimens only), is one component of a comprehensive MRSA colonization surveillance program. It is not intended to diagnose MRSA infection nor to guide or monitor treatment for MRSA infections.          Radiology Studies: No results found.      Scheduled Meds: . bisoprolol  10 mg Oral Daily  . furosemide  40 mg Oral Daily  . insulin aspart  2-6 Units Subcutaneous Q4H  . pantoprazole  40 mg Oral Daily  . zolpidem  5 mg Oral QHS   Continuous Infusions:    LOS: 5 days    Time spent: 40 minutes    Shakeena Kafer, Geraldo Docker, MD [facility] Pager [phone]   If 7PM-7AM, please contact night-coverage [email or web address] Password [facility] 09/08/2017, 8:39 AM

## 2017-09-09 DIAGNOSIS — I340 Nonrheumatic mitral (valve) insufficiency: Secondary | ICD-10-CM

## 2017-09-09 DIAGNOSIS — I5041 Acute combined systolic (congestive) and diastolic (congestive) heart failure: Secondary | ICD-10-CM

## 2017-09-09 DIAGNOSIS — J811 Chronic pulmonary edema: Secondary | ICD-10-CM

## 2017-09-09 DIAGNOSIS — I472 Ventricular tachycardia, unspecified: Secondary | ICD-10-CM

## 2017-09-09 DIAGNOSIS — I469 Cardiac arrest, cause unspecified: Secondary | ICD-10-CM

## 2017-09-09 DIAGNOSIS — Z959 Presence of cardiac and vascular implant and graft, unspecified: Secondary | ICD-10-CM

## 2017-09-09 LAB — BASIC METABOLIC PANEL
Anion gap: 8 (ref 5–15)
BUN: 31 mg/dL — ABNORMAL HIGH (ref 6–20)
CO2: 22 mmol/L (ref 22–32)
Calcium: 8.1 mg/dL — ABNORMAL LOW (ref 8.9–10.3)
Chloride: 106 mmol/L (ref 101–111)
Creatinine, Ser: 1.08 mg/dL — ABNORMAL HIGH (ref 0.44–1.00)
GFR calc Af Amer: 55 mL/min — ABNORMAL LOW (ref >60)
GFR calc non Af Amer: 47 mL/min — ABNORMAL LOW (ref >60)
Glucose, Bld: 111 mg/dL — ABNORMAL HIGH (ref 65–99)
Potassium: 3.8 mmol/L (ref 3.5–5.1)
Sodium: 136 mmol/L (ref 135–145)

## 2017-09-09 LAB — MAGNESIUM: Magnesium: 1.7 mg/dL (ref 1.7–2.4)

## 2017-09-09 LAB — CBC
HCT: 39.6 % (ref 36.0–46.0)
Hemoglobin: 13.5 g/dL (ref 12.0–15.0)
MCH: 31 pg (ref 26.0–34.0)
MCHC: 34.1 g/dL (ref 30.0–36.0)
MCV: 90.8 fL (ref 78.0–100.0)
Platelets: 146 10*3/uL — ABNORMAL LOW (ref 150–400)
RBC: 4.36 MIL/uL (ref 3.87–5.11)
RDW: 13.8 % (ref 11.5–15.5)
WBC: 7.9 10*3/uL (ref 4.0–10.5)

## 2017-09-09 MED ORDER — BISOPROLOL FUMARATE 10 MG PO TABS: 10.0000 mg | ORAL_TABLET | Freq: Every day | ORAL | 0 refills | Status: DC

## 2017-09-09 MED ORDER — RIVAROXABAN 15 MG PO TABS: 15.0000 mg | ORAL_TABLET | Freq: Every day | ORAL | 11 refills | Status: DC

## 2017-09-09 MED ORDER — CALCIUM CARBONATE ANTACID 500 MG PO CHEW: 1.0000 | CHEWABLE_TABLET | ORAL | 0 refills | Status: DC | PRN

## 2017-09-09 MED ORDER — PANTOPRAZOLE SODIUM 40 MG PO TBEC: 40.0000 mg | DELAYED_RELEASE_TABLET | Freq: Every day | ORAL | 0 refills | Status: DC

## 2017-09-09 NOTE — Evaluation (Signed)
Physical Therapy Evaluation Patient Details Name: CAYLEN KUWAHARA MRN: [id number] DOB: 04/10/1935 Today's Date: 09/09/2017   History of Present Illness  82 y.o. female with a past medical history significant for persistent AF, chronic diastolic heart failure, at least moderate MR.  She was admitted for symptomatic WCT felt to be VT.  She underwent ICD placement 09/07/17.     Clinical Impression  PT eval complete. PTA pt independent with all functional mobility. On eval, she required min assist bed mobility, min assist transfers and min guard assist ambulation with RW 150 feet. Pt's son will be staying with her x 1 week upon discharge. She has all needed DME. Recommend HHPT. Pt with probable d/c home today. Will defer further PT intervention to next venue of care. PT signing off.     Follow Up Recommendations Home health PT;Supervision/Assistance - 24 hour    Equipment Recommendations  None recommended by PT(Pt's son to provide all needed DME.)    Recommendations for Other Services       Precautions / Restrictions Precautions Precautions: ICD/Pacemaker Restrictions Weight Bearing Restrictions: Yes LUE Weight Bearing: Non weight bearing Other Position/Activity Restrictions: ok to use BUE for RW       Mobility  Bed Mobility Overal bed mobility: Needs Assistance Bed Mobility: Supine to Sit;Sit to Supine     Supine to sit: Min assist;HOB elevated Sit to supine: Min assist;HOB elevated   General bed mobility comments: +rail, increased time and effort  Transfers Overall transfer level: Needs assistance Equipment used: Rolling walker (2 wheeled) Transfers: Sit to/from [facility] Sit to Stand: Min assist Stand pivot transfers: Min guard       General transfer comment: verbal cues for hand placement  Ambulation/Gait Ambulation/Gait assistance: Min guard Ambulation Distance (Feet): 150 Feet Assistive device: Rolling walker (2 wheeled) Gait  Pattern/deviations: Step-through pattern;Decreased stride length Gait velocity: decreased Gait velocity interpretation: Below normal speed for age/gender General Gait Details: verbal cues to look up. Pt ambulated on RA with sats 94%. Slow, steady gait. Fatigues quickly.  Stairs            Wheelchair Mobility    Modified Rankin (Stroke Patients Only)       Balance Overall balance assessment: Needs assistance Sitting-balance support: No upper extremity supported;Feet supported Sitting balance-Leahy Scale: Good     Standing balance support: Bilateral upper extremity supported;During functional activity Standing balance-Leahy Scale: Fair                               Pertinent Vitals/Pain Pain Assessment: Faces Faces Pain Scale: Hurts little more Pain Location: L shoulder/chest Pain Descriptors / Indicators: Tightness Pain Intervention(s): Monitored during session    Home Living Family/patient expects to be discharged to:: Private residence Living Arrangements: Alone Available Help at Discharge: Family;Available 24 hours/day Type of Home: Other(Comment)(condo) Home Access: Level entry     Home Layout: One level Home Equipment: Walker - 2 wheels;Walker - 4 wheels;Bedside commode;Transport chair      Prior Function Level of Independence: Independent         Comments: Active. Drives. Does her own housekeeping and grocery shopping.     Hand Dominance        Extremity/Trunk Assessment   Upper Extremity Assessment Upper Extremity Assessment: Defer to OT evaluation    Lower Extremity Assessment Lower Extremity Assessment: Generalized weakness    Cervical / Trunk Assessment Cervical / Trunk Assessment: Normal  Communication  Communication: No difficulties  Cognition Arousal/Alertness: Awake/alert Behavior During Therapy: WFL for tasks assessed/performed Overall Cognitive Status: Within Functional Limits for tasks assessed                                         General Comments      Exercises     Assessment/Plan    PT Assessment All further PT needs can be met in the next venue of care  PT Problem List Decreased strength;Decreased mobility;Decreased knowledge of precautions;Decreased activity tolerance;Cardiopulmonary status limiting activity;Decreased balance;Decreased knowledge of use of DME;Pain       PT Treatment Interventions      PT Goals (Current goals can be found in the Care Plan section)  Acute Rehab PT Goals Patient Stated Goal: home today PT Goal Formulation: All assessment and education complete, DC therapy    Frequency     Barriers to discharge        Co-evaluation               AM-PAC PT "6 Clicks" Daily Activity  Outcome Measure Difficulty turning over in bed (including adjusting bedclothes, sheets and blankets)?: A Little Difficulty moving from lying on back to sitting on the side of the bed? : Unable Difficulty sitting down on and standing up from a chair with arms (e.g., wheelchair, bedside commode, etc,.)?: A Little Help needed moving to and from a bed to chair (including a wheelchair)?: A Little Help needed walking in hospital room?: A Little Help needed climbing 3-5 steps with a railing? : A Little 6 Click Score: 16    End of Session Equipment Utilized During Treatment: Gait belt Activity Tolerance: Patient tolerated treatment well Patient left: in bed;with call bell/phone within reach;with family/visitor present Nurse Communication: Mobility status PT Visit Diagnosis: Muscle weakness (generalized) (M62.81);Other abnormalities of gait and mobility (R26.89);Pain Pain - Right/Left: Left Pain - part of body: Shoulder    Time: 7195-9747 PT Time Calculation (min) (ACUTE ONLY): 21 min   Charges:   PT Evaluation $PT Eval Moderate Complexity: 1 Mod     PT G Codes:        Lorrin Goodell, PT  Office # [phone] Pager [phone]   Lorriane Shire 09/09/2017, 10:20 AM

## 2017-09-09 NOTE — Progress Notes (Signed)
Discussed discharge paperwork with the patient and all questioned fully answered. Son at bedside. She will call me if any problems arise. Pt given paper prescriptions. [place] set up per case management.   Fritz Pickerel, RN

## 2017-09-09 NOTE — Discharge Instructions (Signed)
Groin site care No lifting over 5 lbs for 1 week. No vigorous or sexual activity for 1 week.  Keep procedure site clean & dry. If you notice increased pain, swelling, bleeding or pus, call/return!  No soaking baths/hot tubs/pools for 1 week, no showers as below.     Supplemental Discharge Instructions for  Defibrillator Patients  Activity No heavy lifting or vigorous activity with your left/right arm for 6 to 8 weeks.  Do not raise your left/right arm above your head for one week.  Gradually raise your affected arm as drawn below.            09/11/17                        09/12/17                       09/13/17                    09/14/16 __  NO DRIVING for 6 months.  WOUND CARE - Keep the wound area clean and dry.  Do not get this area wet, no showers until cleared to at your wound check visit. - The tape/steri-strips on your wound will fall off; do not pull them off.  No bandage is needed on the site.  DO  NOT apply any creams, oils, or ointments to the wound area. - If you notice any drainage or discharge from the wound, any swelling or bruising at the site, or you develop a fever > 101? F after you are discharged home, call the office at once.  Special Instructions - You are still able to use cellular telephones; use the ear opposite the side where you have your pacemaker/defibrillator.  Avoid carrying your cellular phone near your device. - When traveling through airports, show security personnel your identification card to avoid being screened in the metal detectors.  Ask the security personnel to use the hand wand. - Avoid arc welding equipment, MRI testing (magnetic resonance imaging), TENS units (transcutaneous nerve stimulators).  Call the office for questions about other devices. - Avoid electrical appliances that are in poor condition or are not properly grounded. - Microwave ovens are safe to be near or to operate.  Additional information for defibrillator patients should your  device go off: - If your device goes off ONCE and you feel fine afterward, notify the device clinic nurses. - If your device goes off ONCE and you do not feel well afterward, call 911. - If your device goes off TWICE, call 911. - If your device goes off THREE times in one day, call 911.  DO NOT DRIVE YOURSELF OR A FAMILY MEMBER WITH A DEFIBRILLATOR TO THE HOSPITAL--CALL 911.

## 2017-09-09 NOTE — Progress Notes (Signed)
Progress Note   Subjective   Doing well today, the patient denies CP or SOB.  Nausea has resolved!  No new concerns  Inpatient Medications    Scheduled Meds: . bisoprolol  10 mg Oral Daily  . furosemide  40 mg Oral Daily  . pantoprazole  40 mg Oral Daily  . zolpidem  5 mg Oral QHS   Continuous Infusions:  PRN Meds: acetaminophen, calcium carbonate, ondansetron (ZOFRAN) IV, promethazine   Vital Signs    Vitals:   09/08/17 1857 09/08/17 2105 09/09/17 0540 09/09/17 0721  BP: 98/60 96/64 108/67 114/67  Pulse:  82 82 81  Resp:  (!) 23 20 20   Temp:  (!) 97.5 F (36.4 C) 97.9 F (36.6 C) 98.4 F (36.9 C)  TempSrc:  Oral Oral Oral  SpO2:  96% 93% 97%  Weight:      Height:        Intake/Output Summary (Last 24 hours) at 09/09/2017 0936 Last data filed at 09/09/2017 0720 Gross per 24 hour  Intake 570 ml  Output 1450 ml  Net -880 ml   Filed Weights   09/06/17 0426 09/07/17 0700 09/08/17 0350  Weight: 159 lb 6.3 oz (72.3 kg) 148 lb 13 oz (67.5 kg) 160 lb 7.9 oz (72.8 kg)    Telemetry    afib with V pacing, occasional PVCs, no VT - Personally Reviewed  Physical Exam   GEN- The patient is elderly and frail appearing, alert and oriented x 3 today.   Head- normocephalic, atraumatic Eyes-  Sclera clear, conjunctiva pink Ears- hearing intact Oropharynx- clear Neck- supple, Lungs- Clear to ausculation bilaterally, normal work of breathing Heart- Regular rate and rhythm  (paced) GI- soft, NT, ND, + BS Extremities- no clubbing, cyanosis, or edema  MS- diffuse atrophy Skin- no rash or lesion Psych- euthymic mood, full affect Neuro- strength and sensation are intact   Labs    Chemistry Recent Labs  Lab 09/07/17 0718 09/08/17 0347 09/09/17 0224  NA 137 136 136  K 4.2 4.5 3.8  CL 106 104 106  CO2 21* 20* 22  GLUCOSE 95 99 111*  BUN 17 25* 31*  CREATININE 0.80 0.98 1.08*  CALCIUM 8.2* 8.5* 8.1*  GFRNONAA >60 53* 47*  GFRAA >60 >60 55*  ANIONGAP 10 12  8      Hematology Recent Labs  Lab 09/07/17 0241 09/08/17 0347 09/09/17 0224  WBC 10.0 8.7 7.9  RBC 4.62 4.52 4.36  HGB 14.4 13.9 13.5  HCT 42.0 41.2 39.6  MCV 90.9 91.2 90.8  MCH 31.2 30.8 31.0  MCHC 34.3 33.7 34.1  RDW 13.9 13.8 13.8  PLT 190 196 146*    Cardiac Enzymes Recent Labs  Lab 09/04/17 0012 09/08/17 1043 09/08/17 1447 09/08/17 1758  TROPONINI 0.19* 0.16* 0.17* 0.13*    Recent Labs  Lab 09/03/17 1709  TROPIPOC 0.03        Assessment & Plan    1.  Complete heart block Doing well s/p BiV ICD  2. Permanent afib Rate controlled now s/p AV nodal ablation Resume xarelto on Wednesday  3. Acute on chronic systolic and diastolic dysfunction Near optivolemic Return to prn lasix dosing at discharge (pt to manage with CHF clinic) Losartan on hold until she follows up with Dr Aundra Dubin Continue bisoprolol 10mg  daily at discharge  4. VT Did not tolerate mexiletine due to nausea (though I cannot be sure it was the medicine).  She does not wish to try another AAD at this time.  Bisoprolol increased to 10mg  daily Now s/p ICD implant No driving x 6 months  Routine wound care  I will arrange EP follow-up I have spoken with Dr Aundra Dubin today who will arrange CHF clinic follow-up  OK to discharge from EP standpoint.  I will see as needed while here.  [name] MD, [facility] 09/09/2017 9:36 AM

## 2017-09-09 NOTE — Progress Notes (Signed)
Pt prefers [facility] for [facility] services.   Fritz Pickerel, RN

## 2017-09-09 NOTE — Progress Notes (Signed)
Spoke with Caryl Pina CM. Pt is okay for discharge from her standpoint as all home health arrangements have been made.   Fritz Pickerel, RN

## 2017-09-09 NOTE — Evaluation (Signed)
Occupational Therapy Evaluation and Defer Further OT to [facility] Patient Details Name: Cheryl Vargas MRN: [id number] DOB: 10/13/1934 Today's Date: 09/09/2017    History of Present Illness 82 y.o. female with a past medical history significant for persistent AF, chronic diastolic heart failure, at least moderate MR.  She was admitted for symptomatic WCT felt to be VT.  She underwent ICD placement 09/07/17.    Clinical Impression   PTA Pt independent in ADL and mobility. Pt is currently min A or min guard overall for ADL with mod A for donning UB clothing. Pt eager for education and very pleasant throughout session. Please see ADL section below. Cues for safety with ICD/pacemaker precautions and for transfers. Pt will benefit from skilled OT in the [facility] setting to maximize safety and independence in ADL and functional transfers while maintaining precautions in her own home environment. Special attention to IADL as Pt was very independent before this and typically lives alone. Thank you for the opportunity to serve this patient.    Follow Up Recommendations  Home health OT;Supervision/Assistance - 24 hour(initially)    Equipment Recommendations  None recommended by OT(Pt's son is providing all DME)    Recommendations for Other Services       Precautions / Restrictions Precautions Precautions: ICD/Pacemaker Restrictions Weight Bearing Restrictions: Yes LUE Weight Bearing: Non weight bearing Other Position/Activity Restrictions: ok to use BUE for RW       Mobility Bed Mobility Overal bed mobility: Needs Assistance Bed Mobility: Sit to Supine;Rolling;Sidelying to Sit Rolling: Min guard Sidelying to sit: Min guard;HOB elevated(slightly elevated; vc for sequencing ) Supine to sit: Min assist;HOB elevated Sit to supine: Min guard;HOB elevated   General bed mobility comments: increased time and effort required  Transfers Overall transfer level: Needs assistance Equipment used:  Rolling walker (2 wheeled) Transfers: Sit to/from [facility] Sit to Stand: Min guard Stand pivot transfers: Min guard       General transfer comment: verbal cues for hand placement    Balance Overall balance assessment: Needs assistance Sitting-balance support: No upper extremity supported;Feet supported Sitting balance-Leahy Scale: Good Sitting balance - Comments: sitting EOB with no back support   Standing balance support: Bilateral upper extremity supported;During functional activity Standing balance-Leahy Scale: Fair Standing balance comment: able to maintain balance at sink (leans against surface)                           ADL either performed or assessed with clinical judgement   ADL Overall ADL's : Needs assistance/impaired Eating/Feeding: Modified independent;Sitting   Grooming: Wash/dry hands;Wash/dry face;Oral care;Standing;Min guard Grooming Details (indicate cue type and reason): sink level Upper Body Bathing: Minimal assistance   Lower Body Bathing: Min guard;Sitting/lateral leans   Upper Body Dressing : Minimal assistance;Sitting   Lower Body Dressing: Min guard;Sit to/from stand Lower Body Dressing Details (indicate cue type and reason): able to cross legs to don/doff socks Toilet Transfer: Min guard;Ambulation;RW Toilet Transfer Details (indicate cue type and reason): vc for safe hand placement Toileting- Clothing Manipulation and Hygiene: Min guard;Sit to/from stand Toileting - Clothing Manipulation Details (indicate cue type and reason): able to manage hospital gown and mesh underwear Tub/ Shower Transfer: Walk-in shower;Min guard;Ambulation;Rolling walker   Functional mobility during ADLs: Min guard;Rolling walker(vc for safe hand placement) General ADL Comments: requires vc to maintain precautions with LUE     Vision Baseline Vision/History: Wears glasses Patient Visual Report: No change from baseline Vision Assessment?:  No apparent visual deficits     Perception     Praxis      Pertinent Vitals/Pain Pain Assessment: Faces Faces Pain Scale: Hurts little more Pain Location: L shoulder/chest Pain Descriptors / Indicators: Tightness Pain Intervention(s): Monitored during session;Repositioned     Hand Dominance Right   Extremity/Trunk Assessment Upper Extremity Assessment Upper Extremity Assessment: LUE deficits/detail LUE Deficits / Details: limited ROM post-op   Lower Extremity Assessment Lower Extremity Assessment: Defer to PT evaluation   Cervical / Trunk Assessment Cervical / Trunk Assessment: Normal   Communication Communication Communication: No difficulties   Cognition Arousal/Alertness: Awake/alert Behavior During Therapy: WFL for tasks assessed/performed Overall Cognitive Status: Within Functional Limits for tasks assessed                                     General Comments       Exercises     Shoulder Instructions      Home Living Family/patient expects to be discharged to:: Private residence Living Arrangements: Alone Available Help at Discharge: Family;Available 24 hours/day Type of Home: Other(Comment)(Condo) Home Access: Level entry     Home Layout: One level     Bathroom Shower/Tub: Occupational psychologist: Standard Bathroom Accessibility: Yes   Home Equipment: Environmental consultant - 2 wheels;Walker - 4 wheels;Bedside commode;Transport chair          Prior Functioning/Environment Level of Independence: Independent        Comments: Active. Drives. Does her own housekeeping and grocery shopping.        OT Problem List: Decreased activity tolerance;Decreased range of motion;Impaired balance (sitting and/or standing);Decreased safety awareness;Decreased knowledge of use of DME or AE;Decreased knowledge of precautions;Pain      OT Treatment/Interventions:      OT Goals(Current goals can be found in the care plan section) Acute Rehab OT  Goals Patient Stated Goal: home today OT Goal Formulation: With patient/family Time For Goal Achievement: 09/16/17 Potential to Achieve Goals: Good  OT Frequency:     Barriers to D/C:            Co-evaluation              AM-PAC PT "6 Clicks" Daily Activity     Outcome Measure Help from another person eating meals?: None Help from another person taking care of personal grooming?: A Little Help from another person toileting, which includes using toliet, bedpan, or urinal?: A Little Help from another person bathing (including washing, rinsing, drying)?: A Little Help from another person to put on and taking off regular upper body clothing?: A Lot Help from another person to put on and taking off regular lower body clothing?: A Little 6 Click Score: 18   End of Session Equipment Utilized During Treatment: Gait belt;Rolling walker Nurse Communication: Mobility status  Activity Tolerance: Patient tolerated treatment well Patient left: in bed;with call bell/phone within reach;with family/visitor present  OT Visit Diagnosis: Unsteadiness on feet (R26.81);Other abnormalities of gait and mobility (R26.89);Muscle weakness (generalized) (M62.81)                Time: 4627-0350 OT Time Calculation (min): 22 min Charges:  OT General Charges $OT Visit: 1 Visit OT Evaluation $OT Eval Moderate Complexity: 1 Mod G-Codes:     Hulda Humphrey OTR/L [place] 09/09/2017, 11:42 AM

## 2017-09-09 NOTE — Discharge Summary (Signed)
Physician Discharge Summary  Cheryl Vargas HDQ:[id number] DOB: 1935/04/11 DOA: 09/03/2017  PCP: Lavone Orn, MD  Admit date: 09/03/2017 Discharge date: 09/09/2017  Time spent: 35 minutes  Recommendations for Outpatient Follow-up:  Acute Hypoxic Respiratory Failure in setting of pulmonary edema  -12/25 CXR w/ bilateral pleural effusions  -Titrate O2 to maintain SPO2> 93%   Combined Systolic/Diastolic CHF (TEE on 11/94 EF 20-25)   -Strict in and out since admission -1.8 L -Daily weight Filed Weights   09/06/17 0426 09/07/17 0700 09/08/17 0350  Weight: 159 lb 6.3 oz (72.3 kg) 148 lb 13 oz (67.5 kg) 160 lb 7.9 oz (72.8 kg)  -Bisoprolol 10 mg daily -Lasix PRN patient to manage with CHF clinic. -Losartan on hold until she follows up with Dr. Aundra Dubin -Dr. Rayann Heman EP spoke with Dr. Aundra Dubin Cardiology and will arrange follow-up at CHF clinic   Pulseless VT arrest 3-5 minutes s/p cardioversion  -12/28 S/P ICD placement   -No driving x 6 months -EP to arrange follow-up   Permanent A.Fib RVR  -s/p Multiple Cardioversion  - Followed  outpatient by EP Dr. [name] and Cardiology by Dr Aundra Dubin -MAZE vs MVR. Patient still considering -Rate controlled -Resume Xarelto on Wednesday per cardiology    Severe MR   -see A. 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 HR 242, A.fib  vs Vtach. Adenosine administered x 2 without relief. Cardioverted. First attempt was not successful and patient went into pulseless VT. Second attempt successful. Loaded with amiodarone. Placed on Bipap for hypoxia. CXR with pulmonary edema. Given 40 meq lasix. Cardiology consulted. PCCM asked to admit.    82 year old woman with past medical history of atrial fibrillation that has been difficult to control presented to the emergency department with a heart rate greater than 200. Adenosine was unsuccessful in converting the rhythm at which time she was given a synchronized cardioversion. After synchronous cardioversion she went into ventricular tachycardia without a pulse and required ACLS in 4-5 minutes of CPR with defibrillation. After return of spontaneous circulation she became alert. She subsequently developed hypoxia in the setting of pulmonary edema and was placed on BiPAP. She was evaluated by cardiology and recommended beta-blocker, Lasix, amiodarone.    During his hospitalization patient was treated for acute respiratory failure with hypoxia secondary to pulmonary edema and pulseless V. tach arrest. Patient S/P ICD placement. In addition initially patient with refractory nausea most likely secondary to anesthetic, now resolved.   Procedures: CXR 12/24 > Vascular congestion with hazy lung opacities, right greater than left, consistent with asymmetric pulmonary edema  Echocardiogram: LVEF= 25% to 30%. Diffuse hypokinesis. - Ventricular septum: Septal motion showed abnormal function and dyssynergy. - Mitral valve: moderate regurgitation. - Left atrium:  severely dilated.  - Right atrium: moderately dilated. - Tricuspid valve: moderate regurgitation.   Consultations: Cardiology EP    Discharge Exam: Vitals:   09/08/17 1857  09/08/17 2105 09/09/17 0540 09/09/17 0721  BP: 98/60 96/64 108/67 114/67  Pulse:  82 82 81  Resp:  (!) 23 20 20   Temp:  (!) 97.5 F (36.4 C) 97.9 F (36.6 C)  98.4 F (36.9 C)  TempSrc:  Oral Oral Oral  SpO2:  96% 93% 97%  Weight:      Height:        General: A/O 4, No acute respiratory distress Neck:  Negative scars, masses, torticollis, lymphadenopathy, JVD Lungs: Clear to auscultation bilaterally without wheezes or crackles Cardiovascular: Regular rate and rhythm without murmur gallop or rub normal S1 and S2, left chest wall ICD in place incision site covered and clean no sign of bleeding/infection.   Discharge Instructions   Allergies as of 09/09/2017      Reactions   Sotalol Other (See Comments)   Prolonged QTc   Alendronate Sodium Other (See Comments)   Arm pain   Amiodarone Nausea Only   Ciprofloxacin Other (See Comments)   Not effective   Coreg [carvedilol] Other (See Comments)   Fatigue   Metoprolol Other (See Comments)   Profound fatigue      Medication List    STOP taking these medications   losartan 25 MG tablet Commonly known as:  COZAAR     TAKE these medications   acetaminophen 500 MG tablet Commonly known as:  TYLENOL Take 500 mg by mouth every 6 (six) hours as needed for moderate pain.   bisoprolol 10 MG tablet Commonly known as:  ZEBETA Take 1 tablet (10 mg total) by mouth daily. Start taking on:  09/10/2017 What changed:    medication strength  how much to take  when to take this   calcium carbonate 500 MG chewable tablet Commonly known as:  TUMS - dosed in mg elemental calcium Chew 1-2 tablets (200-400 mg of elemental calcium total) by mouth as needed for indigestion or heartburn.   docusate sodium 100 MG capsule Commonly known as:  COLACE Take 100 mg by mouth daily as needed for mild constipation.   fluticasone 50 MCG/ACT nasal spray Commonly known as:  FLONASE Place 1 spray into both nostrils daily as needed for allergies or rhinitis.   furosemide 20 MG tablet Commonly known as:  LASIX Take 1 tablet (20 mg total) by mouth as needed for fluid or edema. What changed:  when to take  this   naphazoline-pheniramine 0.025-0.3 % ophthalmic solution Commonly known as:  NAPHCON-A Place 1 drop into both eyes daily as needed (dry eyes).   pantoprazole 40 MG tablet Commonly known as:  PROTONIX Take 1 tablet (40 mg total) by mouth daily. Start taking on:  09/10/2017   Rivaroxaban 15 MG Tabs tablet Commonly known as:  XARELTO Take 1 tablet (15 mg total) by mouth daily with supper. RESUME ON 2 JAN What changed:  additional instructions   zolpidem 5 MG tablet Commonly known as:  AMBIEN Take 5 mg by mouth at bedtime.      Allergies  Allergen Reactions  . Sotalol Other (See Comments)    Prolonged QTc  . Alendronate Sodium Other (See Comments)    Arm pain  . Amiodarone Nausea Only  . Ciprofloxacin Other (See Comments)    Not effective  . Coreg [Carvedilol] Other (See Comments)    Fatigue   . Metoprolol Other (See Comments)    Profound fatigue   Follow-up Information    [name], MD Follow up on 10/22/2017.   Specialty:  Cardiology Why:  10:45AM Contact information: [place] [address] [phone]        [place] [facility] Follow up on 09/20/2017.   Specialty:  Cardiology Why:  10:30AM, wound check visit Contact information: [address] [place]       [place] Follow up on 10/04/2017.   Specialty:  Cardiology Why:  10:30AM, routine defibrillator programming visit Contact information: [address] [place] [phone]           The results of significant diagnostics from this hospitalization (including imaging, microbiology, ancillary and laboratory) are listed below for reference.    Significant Diagnostic Studies: Dg Chest 2 View  Result Date: 09/08/2017 CLINICAL DATA:  Cardiac device in situ.  AICD placement. EXAM: CHEST  2 VIEW COMPARISON:  One-view chest x-ray  09/05/2017 FINDINGS: Heart is enlarged. Bilateral pleural effusions are again noted. Bibasilar airspace disease likely reflects atelectasis. Overall aeration is improved. Atherosclerotic calcifications are present at the aortic arch. A pacemaker/AICD has been placed via a left subclavian approach. Right ventricular and coronary sinus leads are noted. There is no pneumothorax. IMPRESSION: 1. Interval placement of pacemaker/AICD via a left subclavian approach. No complications. 2. Improving aeration with persistent bilateral pleural effusions and associated atelectasis. Electronically Signed   By: San Morelle M.D.   On: 09/08/2017 10:15   Dg Chest Port 1 View  Result Date: 09/05/2017 CLINICAL DATA:  Pulmonary edema. EXAM: PORTABLE CHEST 1 VIEW COMPARISON:  09/04/2017. FINDINGS: Cardiomegaly with normal pulmonary vascularity. Dense progressed right perihilar right base atelectasis/ infiltrate. Increased right pleural effusion. Small left pleural effusion again noted. No pneumothorax. IMPRESSION: 1. Dense progressive right perihilar right base atelectasis/ infiltrate and progressive right pleural effusion. 2. Persistent left base atelectasis and infiltrate and small left pleural effusion unchanged. Electronically Signed   By: Marcello Moores  Register   On: 09/05/2017 07:47   Dg Chest Port 1 View  Result Date: 09/04/2017 CLINICAL DATA:  Pulmonary edema, post CPR EXAM: PORTABLE CHEST 1 VIEW COMPARISON:  09/03/2017 FINDINGS: There is hyperinflation of the lungs compatible with COPD. Cardiomegaly. Moderate layering bilateral effusions. Vascular congestion and bilateral lower lobe atelectasis or infiltrates. No pneumothorax. IMPRESSION: COPD, cardiomegaly. Moderate layering bilateral effusions with vascular congestion and bilateral lower lobe atelectasis or infiltrates. Electronically Signed   By: Rolm Baptise M.D.   On: 09/04/2017 07:40   Dg Chest Portable 1 View  Result Date: 09/03/2017 CLINICAL DATA:   Status post CPR. EXAM: PORTABLE CHEST 1 VIEW COMPARISON:  10/18/2015 FINDINGS: There is vascular congestion with hazy airspace lung opacity noted in the right mid and lower lung and at the left lung base. No gross pneumothorax on this supine exam. Cardiac silhouette is normal in size. No mediastinal or hilar masses. Skeletal structures are demineralized but grossly intact. IMPRESSION: 1. Vascular congestion with hazy lung opacities, right greater than left, consistent with asymmetric pulmonary edema. Electronically Signed   By: Lajean Manes M.D.   On: 09/03/2017 17:50    Microbiology: Recent Results (from the past 240 hour(s))  MRSA PCR Screening     Status: None   Collection Time: 09/03/17 10:53 PM  Result Value Ref Range Status   MRSA by PCR NEGATIVE NEGATIVE Final    Comment:        The GeneXpert MRSA Assay (FDA approved for NASAL specimens only), is one component of a comprehensive MRSA colonization surveillance program. It is  not intended to diagnose MRSA infection nor to guide or monitor treatment for MRSA infections.      Labs: Basic Metabolic Panel: Recent Labs  Lab 09/04/17 0434 09/05/17 0423 09/06/17 0321 09/07/17 0718 09/08/17 0347 09/09/17 0224  NA 138 135 136 137 136 136  K 3.7 3.7 3.5 4.2 4.5 3.8  CL 108 105 106 106 104 106  CO2 19* 21* 23 21* 20* 22  GLUCOSE 158* 93 94 95 99 111*  BUN 17 19 18 17  25* 31*  CREATININE 0.97 1.01* 0.88 0.80 0.98 1.08*  CALCIUM 8.1* 7.9* 7.8* 8.2* 8.5* 8.1*  MG 1.9  --  1.9 1.8 1.9 1.7  PHOS 3.3  --   --   --   --   --    Liver Function Tests: No results for input(s): AST, ALT, ALKPHOS, BILITOT, PROT, ALBUMIN in the last 168 hours. No results for input(s): LIPASE, AMYLASE in the last 168 hours. No results for input(s): AMMONIA in the last 168 hours. CBC: Recent Labs  Lab 09/03/17 1709  09/04/17 0434 09/05/17 0423 09/07/17 0241 09/08/17 0347 09/09/17 0224  WBC 7.8  --  11.3* 9.5 10.0 8.7 7.9  NEUTROABS 4.0  --   --    --   --   --   --   HGB 14.2   < > 14.7 13.7 14.4 13.9 13.5  HCT 41.6   < > 42.0 38.5 42.0 41.2 39.6  MCV 92.4  --  90.7 90.2 90.9 91.2 90.8  PLT 177  --  181 146* 190 196 146*   < > = values in this interval not displayed.   Cardiac Enzymes: Recent Labs  Lab 09/04/17 0012 09/08/17 1043 09/08/17 1447 09/08/17 1758  TROPONINI 0.19* 0.16* 0.17* 0.13*   BNP: BNP (last 3 results) No results for input(s): BNP in the last 8760 hours.  ProBNP (last 3 results) No results for input(s): PROBNP in the last 8760 hours.  CBG: Recent Labs  Lab 09/07/17 1741 09/07/17 2118 09/08/17 0614 09/08/17 1110 09/08/17 1617  GLUCAP 106* 133* 94 99 175*       Signed:  Dia Crawford, MD [facility] [phone] pager

## 2017-09-10 ENCOUNTER — Encounter (HOSPITAL_COMMUNITY): Payer: PPO | Admitting: Cardiology

## 2017-09-10 MED FILL — Bupivacaine HCl Preservative Free (PF) Inj 0.25%: INTRAMUSCULAR | Qty: 30 | Status: AC

## 2017-09-10 MED FILL — Heparin Sodium (Porcine) 2 Unit/ML in Sodium Chloride 0.9%: INTRAMUSCULAR | Qty: 500 | Status: AC

## 2017-09-12 ENCOUNTER — Encounter (HOSPITAL_COMMUNITY): Payer: Self-pay | Admitting: Internal Medicine

## 2017-09-12 NOTE — Consult Note (Signed)
            [facility] CM Primary Care Navigator  09/12/2017  Cheryl Vargas Jan 22, 1935 [id number]   Previous attempt to seepatient at the bedsideto identify possible discharge needs but she was alreadydischargedper staff report.  She wasadmitted forweakness and dyspnea with history of atrial fibrillation that has been difficult to control. Patient was treated for acute respiratory failure with hypoxia secondary to pulmonary edema. Patient is S/P ICD placement.  Patient was discharged home with home health services.  Primary care provider's officeis listed asprovidingtransition of care (TOC).  Patient has discharge instruction to follow-up withcardiology on 09/20/17 and will be followed-up at HF clinic as well.   For additional questions please contact:  Edwena Felty A. Fumio Vandam, BSN, RN-BC [facility] PRIMARY CARE Navigator Cell: [phone]

## 2017-09-13 DIAGNOSIS — Z48812 Encounter for surgical aftercare following surgery on the circulatory system: Secondary | ICD-10-CM | POA: Diagnosis not present

## 2017-09-13 DIAGNOSIS — Z95810 Presence of automatic (implantable) cardiac defibrillator: Secondary | ICD-10-CM | POA: Diagnosis not present

## 2017-09-13 DIAGNOSIS — Z9181 History of falling: Secondary | ICD-10-CM | POA: Diagnosis not present

## 2017-09-13 DIAGNOSIS — I5042 Chronic combined systolic (congestive) and diastolic (congestive) heart failure: Secondary | ICD-10-CM | POA: Diagnosis not present

## 2017-09-13 DIAGNOSIS — I4892 Unspecified atrial flutter: Secondary | ICD-10-CM | POA: Diagnosis not present

## 2017-09-13 DIAGNOSIS — G4700 Insomnia, unspecified: Secondary | ICD-10-CM | POA: Diagnosis not present

## 2017-09-13 DIAGNOSIS — Z96642 Presence of left artificial hip joint: Secondary | ICD-10-CM | POA: Diagnosis not present

## 2017-09-13 DIAGNOSIS — I110 Hypertensive heart disease with heart failure: Secondary | ICD-10-CM | POA: Diagnosis not present

## 2017-09-13 DIAGNOSIS — I481 Persistent atrial fibrillation: Secondary | ICD-10-CM | POA: Diagnosis not present

## 2017-09-13 DIAGNOSIS — E785 Hyperlipidemia, unspecified: Secondary | ICD-10-CM | POA: Diagnosis not present

## 2017-09-13 DIAGNOSIS — M1990 Unspecified osteoarthritis, unspecified site: Secondary | ICD-10-CM | POA: Diagnosis not present

## 2017-09-17 ENCOUNTER — Emergency Department (HOSPITAL_COMMUNITY): Payer: PPO

## 2017-09-17 ENCOUNTER — Inpatient Hospital Stay (HOSPITAL_COMMUNITY): Payer: PPO

## 2017-09-17 ENCOUNTER — Telehealth: Payer: Self-pay | Admitting: Cardiology

## 2017-09-17 ENCOUNTER — Other Ambulatory Visit: Payer: Self-pay

## 2017-09-17 ENCOUNTER — Inpatient Hospital Stay (HOSPITAL_COMMUNITY)
Admission: EM | Admit: 2017-09-17 | Discharge: 2017-09-20 | DRG: 293 | Disposition: A | Discharge status: Skilled Nursing Facility | Payer: PPO | Attending: Family Medicine | Admitting: Family Medicine

## 2017-09-17 ENCOUNTER — Encounter (HOSPITAL_COMMUNITY): Payer: Self-pay | Admitting: Emergency Medicine

## 2017-09-17 DIAGNOSIS — Z95810 Presence of automatic (implantable) cardiac defibrillator: Secondary | ICD-10-CM

## 2017-09-17 DIAGNOSIS — M1990 Unspecified osteoarthritis, unspecified site: Secondary | ICD-10-CM | POA: Diagnosis present

## 2017-09-17 DIAGNOSIS — R945 Abnormal results of liver function studies: Secondary | ICD-10-CM

## 2017-09-17 DIAGNOSIS — N281 Cyst of kidney, acquired: Secondary | ICD-10-CM | POA: Diagnosis not present

## 2017-09-17 DIAGNOSIS — Z79899 Other long term (current) drug therapy: Secondary | ICD-10-CM

## 2017-09-17 DIAGNOSIS — I081 Rheumatic disorders of both mitral and tricuspid valves: Secondary | ICD-10-CM | POA: Diagnosis present

## 2017-09-17 DIAGNOSIS — G4700 Insomnia, unspecified: Secondary | ICD-10-CM | POA: Diagnosis not present

## 2017-09-17 DIAGNOSIS — I472 Ventricular tachycardia: Secondary | ICD-10-CM | POA: Diagnosis not present

## 2017-09-17 DIAGNOSIS — R109 Unspecified abdominal pain: Secondary | ICD-10-CM | POA: Diagnosis not present

## 2017-09-17 DIAGNOSIS — K819 Cholecystitis, unspecified: Secondary | ICD-10-CM | POA: Diagnosis not present

## 2017-09-17 DIAGNOSIS — Z7901 Long term (current) use of anticoagulants: Secondary | ICD-10-CM

## 2017-09-17 DIAGNOSIS — I5023 Acute on chronic systolic (congestive) heart failure: Secondary | ICD-10-CM

## 2017-09-17 DIAGNOSIS — I340 Nonrheumatic mitral (valve) insufficiency: Secondary | ICD-10-CM | POA: Diagnosis not present

## 2017-09-17 DIAGNOSIS — Z111 Encounter for screening for respiratory tuberculosis: Secondary | ICD-10-CM | POA: Diagnosis not present

## 2017-09-17 DIAGNOSIS — R1013 Epigastric pain: Secondary | ICD-10-CM | POA: Diagnosis not present

## 2017-09-17 DIAGNOSIS — Z888 Allergy status to other drugs, medicaments and biological substances status: Secondary | ICD-10-CM

## 2017-09-17 DIAGNOSIS — K761 Chronic passive congestion of liver: Secondary | ICD-10-CM | POA: Diagnosis not present

## 2017-09-17 DIAGNOSIS — R0600 Dyspnea, unspecified: Secondary | ICD-10-CM | POA: Diagnosis not present

## 2017-09-17 DIAGNOSIS — Z8674 Personal history of sudden cardiac arrest: Secondary | ICD-10-CM

## 2017-09-17 DIAGNOSIS — R7989 Other specified abnormal findings of blood chemistry: Secondary | ICD-10-CM | POA: Diagnosis not present

## 2017-09-17 DIAGNOSIS — Z950 Presence of cardiac pacemaker: Secondary | ICD-10-CM | POA: Diagnosis not present

## 2017-09-17 DIAGNOSIS — H04129 Dry eye syndrome of unspecified lacrimal gland: Secondary | ICD-10-CM | POA: Diagnosis not present

## 2017-09-17 DIAGNOSIS — E785 Hyperlipidemia, unspecified: Secondary | ICD-10-CM | POA: Diagnosis not present

## 2017-09-17 DIAGNOSIS — I480 Paroxysmal atrial fibrillation: Secondary | ICD-10-CM | POA: Diagnosis not present

## 2017-09-17 DIAGNOSIS — Z96642 Presence of left artificial hip joint: Secondary | ICD-10-CM | POA: Diagnosis present

## 2017-09-17 DIAGNOSIS — R52 Pain, unspecified: Secondary | ICD-10-CM | POA: Diagnosis not present

## 2017-09-17 DIAGNOSIS — R112 Nausea with vomiting, unspecified: Secondary | ICD-10-CM | POA: Diagnosis not present

## 2017-09-17 DIAGNOSIS — I4891 Unspecified atrial fibrillation: Secondary | ICD-10-CM | POA: Diagnosis not present

## 2017-09-17 DIAGNOSIS — R279 Unspecified lack of coordination: Secondary | ICD-10-CM | POA: Diagnosis not present

## 2017-09-17 DIAGNOSIS — R262 Difficulty in walking, not elsewhere classified: Secondary | ICD-10-CM | POA: Diagnosis not present

## 2017-09-17 DIAGNOSIS — I509 Heart failure, unspecified: Secondary | ICD-10-CM

## 2017-09-17 DIAGNOSIS — I5043 Acute on chronic combined systolic (congestive) and diastolic (congestive) heart failure: Secondary | ICD-10-CM | POA: Diagnosis present

## 2017-09-17 DIAGNOSIS — I5042 Chronic combined systolic (congestive) and diastolic (congestive) heart failure: Secondary | ICD-10-CM | POA: Diagnosis present

## 2017-09-17 DIAGNOSIS — I10 Essential (primary) hypertension: Secondary | ICD-10-CM | POA: Diagnosis not present

## 2017-09-17 DIAGNOSIS — R0602 Shortness of breath: Secondary | ICD-10-CM | POA: Diagnosis not present

## 2017-09-17 DIAGNOSIS — I428 Other cardiomyopathies: Secondary | ICD-10-CM | POA: Diagnosis present

## 2017-09-17 DIAGNOSIS — Z881 Allergy status to other antibiotic agents status: Secondary | ICD-10-CM | POA: Diagnosis not present

## 2017-09-17 DIAGNOSIS — M6281 Muscle weakness (generalized): Secondary | ICD-10-CM | POA: Diagnosis not present

## 2017-09-17 DIAGNOSIS — I110 Hypertensive heart disease with heart failure: Principal | ICD-10-CM | POA: Diagnosis present

## 2017-09-17 DIAGNOSIS — Z8249 Family history of ischemic heart disease and other diseases of the circulatory system: Secondary | ICD-10-CM

## 2017-09-17 DIAGNOSIS — K5900 Constipation, unspecified: Secondary | ICD-10-CM | POA: Diagnosis not present

## 2017-09-17 DIAGNOSIS — J309 Allergic rhinitis, unspecified: Secondary | ICD-10-CM | POA: Diagnosis not present

## 2017-09-17 LAB — CBC WITH DIFFERENTIAL/PLATELET
Basophils Absolute: 0 10*3/uL (ref 0.0–0.1)
Basophils Relative: 0 %
Eosinophils Absolute: 0.1 10*3/uL (ref 0.0–0.7)
Eosinophils Relative: 1 %
HCT: 40.4 % (ref 36.0–46.0)
Hemoglobin: 13.6 g/dL (ref 12.0–15.0)
Lymphocytes Relative: 10 %
Lymphs Abs: 0.9 10*3/uL (ref 0.7–4.0)
MCH: 31.2 pg (ref 26.0–34.0)
MCHC: 33.7 g/dL (ref 30.0–36.0)
MCV: 92.7 fL (ref 78.0–100.0)
Monocytes Absolute: 0.6 10*3/uL (ref 0.1–1.0)
Monocytes Relative: 6 %
Neutro Abs: 8.1 10*3/uL — ABNORMAL HIGH (ref 1.7–7.7)
Neutrophils Relative %: 83 %
Platelets: 210 10*3/uL (ref 150–400)
RBC: 4.36 MIL/uL (ref 3.87–5.11)
RDW: 14.3 % (ref 11.5–15.5)
WBC: 9.7 10*3/uL (ref 4.0–10.5)

## 2017-09-17 LAB — COMPREHENSIVE METABOLIC PANEL
ALT: 62 U/L — ABNORMAL HIGH (ref 14–54)
AST: 113 U/L — ABNORMAL HIGH (ref 15–41)
Albumin: 2.9 g/dL — ABNORMAL LOW (ref 3.5–5.0)
Alkaline Phosphatase: 88 U/L (ref 38–126)
Anion gap: 9 (ref 5–15)
BUN: 18 mg/dL (ref 6–20)
CO2: 20 mmol/L — ABNORMAL LOW (ref 22–32)
Calcium: 8.1 mg/dL — ABNORMAL LOW (ref 8.9–10.3)
Chloride: 109 mmol/L (ref 101–111)
Creatinine, Ser: 0.93 mg/dL (ref 0.44–1.00)
GFR calc Af Amer: >60 mL/min (ref >60)
GFR calc non Af Amer: 57 mL/min — ABNORMAL LOW (ref >60)
Glucose, Bld: 117 mg/dL — ABNORMAL HIGH (ref 65–99)
Potassium: 3.9 mmol/L (ref 3.5–5.1)
Sodium: 138 mmol/L (ref 135–145)
Total Bilirubin: 1.1 mg/dL (ref 0.3–1.2)
Total Protein: 5.9 g/dL — ABNORMAL LOW (ref 6.5–8.1)

## 2017-09-17 LAB — URINALYSIS, ROUTINE W REFLEX MICROSCOPIC
Bilirubin Urine: NEGATIVE
Glucose, UA: NEGATIVE mg/dL
Ketones, ur: NEGATIVE mg/dL
Nitrite: NEGATIVE
Protein, ur: 30 mg/dL — AB
Specific Gravity, Urine: 1.026 (ref 1.005–1.030)
pH: 5 (ref 5.0–8.0)

## 2017-09-17 LAB — LIPASE, BLOOD: Lipase: 27 U/L (ref 11–51)

## 2017-09-17 LAB — BRAIN NATRIURETIC PEPTIDE: B Natriuretic Peptide: 1492.6 pg/mL — ABNORMAL HIGH (ref 0.0–100.0)

## 2017-09-17 LAB — I-STAT TROPONIN, ED: Troponin i, poc: 0.03 ng/mL (ref 0.00–0.08)

## 2017-09-17 MED ORDER — PROMETHAZINE HCL 25 MG/ML IJ SOLN
12.5000 mg | Freq: Once | INTRAMUSCULAR | Status: AC
  Administered 2017-09-17: 12.5 mg via INTRAVENOUS
  Filled 2017-09-17: qty 1

## 2017-09-17 MED ORDER — SODIUM CHLORIDE 0.9 % IV BOLUS (SEPSIS)
500.0000 mL | Freq: Once | INTRAVENOUS | Status: AC
  Administered 2017-09-17: 500 mL via INTRAVENOUS

## 2017-09-17 MED ORDER — FUROSEMIDE 10 MG/ML IJ SOLN
40.0000 mg | Freq: Once | INTRAMUSCULAR | Status: AC
  Administered 2017-09-18: 40 mg via INTRAVENOUS
  Filled 2017-09-17: qty 4

## 2017-09-17 MED ORDER — LORAZEPAM 2 MG/ML IJ SOLN
1.0000 mg | Freq: Once | INTRAMUSCULAR | Status: AC
  Administered 2017-09-17: 1 mg via INTRAVENOUS
  Filled 2017-09-17: qty 1

## 2017-09-17 MED ORDER — PIPERACILLIN-TAZOBACTAM 3.375 G IVPB 30 MIN
3.3750 g | Freq: Once | INTRAVENOUS | Status: AC
  Administered 2017-09-18: 3.375 g via INTRAVENOUS
  Filled 2017-09-17: qty 50

## 2017-09-17 MED ORDER — IOPAMIDOL (ISOVUE-300) INJECTION 61%
INTRAVENOUS | Status: AC
  Administered 2017-09-17: 100 mL
  Filled 2017-09-17: qty 100

## 2017-09-17 NOTE — ED Notes (Signed)
Pt in imaging

## 2017-09-17 NOTE — ED Triage Notes (Signed)
[facility]-Pt from home reports sudden onset of shortness of breath with nausea and weakness. She is 1 week post arrest with PM/Defib in place. No shocks per pt. A/O denies chest pain. Vitals stable.

## 2017-09-17 NOTE — Care Management (Addendum)
ED CM consulted concerning patient recently discharged  home with [facility] services from hospital 09/09/17. Patient returned today with increased weakening, patient lives alone in senior apartment dwelling and reports not being able to care for self, as per EDP  Her transitional care goal is for Rehab placement for reconditioning. Patient does have a qualifying 3 day stay. PT re-eval needed and is pending.  ED CM and ED SW went to room to meet with patient who was Chales Salmon, daughter Courtney Paris [phone] at bedside.  Patient ED eval still in progress.

## 2017-09-17 NOTE — ED Notes (Signed)
Patient transported to Ultrasound 

## 2017-09-17 NOTE — ED Provider Notes (Addendum)
[facility] EMERGENCY DEPARTMENT Provider Note   CSN: [id number] Arrival date & time: 09/17/17  1819     History   Chief Complaint Chief Complaint  Patient presents with  . Shortness of Breath  . Nausea  . Weakness    HPI Cheryl Vargas is a 83 y.o. female history of A. fib on Xarelto, CHF, V. tach status post pacemaker here presenting with persistent weakness, shortness of breath, nausea or vomiting.  Patient states that Cheryl Vargas was discharged from the hospital about a week ago.  At that time Cheryl Vargas had a cardioversion and had V. tach arrest and eventually a pacemaker and defibrillator was placed.  Patient states that after discharge, Cheryl Vargas has persistently been feeling weak and short of breath.  Cheryl Vargas has poor intake and had occasional nausea and vomiting despite taking Zofran.  Patient also had several episodes of loose stools as well. Denies defibrillator firing.   The history is provided by the patient.    Past Medical History:  Diagnosis Date  . Arthritis   . Chronic combined systolic (congestive) and diastolic (congestive) heart failure ([place])   . Chronic systolic (congestive) heart failure ([place])   . Dysrhythmia    pvc's per pt  . Enlarged heart   . Mitral regurgitation   . Non-ischemic cardiomyopathy ([place])   . Paroxysmal A-fib ([place])   . Paroxysmal atrial fibrillation (HCC)   . Persistent atrial fibrillation ([place])   . PONV (postoperative nausea and vomiting)     Patient Active Problem List   Diagnosis Date Noted  . CHF (congestive heart failure) ([place]) 09/17/2017  . Pulmonary edema   . Cardiac device in situ   . Ventricular tachycardia ([place])   . Cardiac arrest ([place])   . Systolic and diastolic CHF, acute ([place])   . Severe mitral valve regurgitation   . A-fib ([place]) 09/03/2017  . Mitral regurgitation   . Chronic systolic (congestive) heart failure ([place])   . Non-ischemic cardiomyopathy ([place])   . CHF (congestive heart failure), NYHA class IV ([place]) 04/24/2016   . Dizziness 12/01/2015  . Hypertension 12/01/2015  . Insomnia 12/01/2015  . Acute on chronic combined systolic (congestive) and diastolic (congestive) heart failure ([place]) 12/01/2015  . Paroxysmal atrial fibrillation (HCC)   . Closed left hip fracture ([place]) 03/17/2013  . Cardiomegaly 03/17/2013  . PVC (premature ventricular contraction) 03/17/2013  . Hyperlipidemia 03/17/2013    Past Surgical History:  Procedure Laterality Date  . AV NODE ABLATION N/A 09/07/2017   Procedure: AV NODE ABLATION;  Surgeon: [name], MD;  Location: [facility];  Service: Cardiovascular;  Laterality: N/A;  . BIV ICD INSERTION CRT-D N/A 09/07/2017   Procedure: BIV ICD INSERTION CRT-D;  Surgeon: [name], MD;  Location: [facility];  Service: Cardiovascular;  Laterality: N/A;  . BUNIONECTOMY    . CARDIAC CATHETERIZATION     in 2004 at [facility] "Insignificant blockage" per patient  . CARDIAC CATHETERIZATION N/A 04/25/2016   Procedure: Left Heart Cath and Coronary Angiography;  Surgeon: Nelva Bush, MD;  Location: [facility];  Service: Cardiovascular;  Laterality: N/A;  . CARDIOVERSION N/A 06/29/2017   Procedure: CARDIOVERSION;  Surgeon: Larey Dresser, MD;  Location: [facility] ENDOSCOPY;  Service: Cardiovascular;  Laterality: N/A;  . CARDIOVERSION N/A 08/28/2017   Procedure: CARDIOVERSION;  Surgeon: Larey Dresser, MD;  Location: [facility] ENDOSCOPY;  Service: Cardiovascular;  Laterality: N/A;  . SHOULDER SURGERY     closed reduction  . TEE WITHOUT CARDIOVERSION  N/A 06/29/2017   Procedure: TRANSESOPHAGEAL ECHOCARDIOGRAM (TEE);  Surgeon: Larey Dresser, MD;  Location: [facility] ENDOSCOPY;  Service: Cardiovascular;  Laterality: N/A;  . TONSILLECTOMY    . TOTAL HIP ARTHROPLASTY Left 03/18/2013   Procedure: TOTAL HIP ARTHROPLASTY ANTERIOR APPROACH;  Surgeon: Mauri Pole, MD;  Location: [facility] ORS;  Service: Orthopedics;  Laterality: Left;  . TUBAL LIGATION      OB History    No data  available       Home Medications    Prior to Admission medications   Medication Sig Start Date End Date Taking? Authorizing Provider  acetaminophen (TYLENOL) 500 MG tablet Take 500 mg by mouth every 6 (six) hours as needed for moderate pain.    [provider]  bisoprolol (ZEBETA) 10 MG tablet Take 1 tablet (10 mg total) by mouth daily. 09/10/17   Allie Bossier, MD  calcium carbonate (TUMS - DOSED IN MG ELEMENTAL CALCIUM) 500 MG chewable tablet Chew 1-2 tablets (200-400 mg of elemental calcium total) by mouth as needed for indigestion or heartburn. 09/09/17   Allie Bossier, MD  docusate sodium (COLACE) 100 MG capsule Take 100 mg by mouth daily as needed for mild constipation.    [provider]  fluticasone (FLONASE) 50 MCG/ACT nasal spray Place 1 spray into both nostrils daily as needed for allergies or rhinitis.    [provider]  furosemide (LASIX) 20 MG tablet Take 1 tablet (20 mg total) by mouth as needed for fluid or edema. Patient taking differently: Take 20 mg by mouth daily as needed for fluid or edema.  03/05/17   Larey Dresser, MD  naphazoline-pheniramine (NAPHCON-A) 0.025-0.3 % ophthalmic solution Place 1 drop into both eyes daily as needed (dry eyes).     [provider]  pantoprazole (PROTONIX) 40 MG tablet Take 1 tablet (40 mg total) by mouth daily. 09/10/17   Allie Bossier, MD  Rivaroxaban (XARELTO) 15 MG TABS tablet Take 1 tablet (15 mg total) by mouth daily with supper. RESUME ON 2 JAN 09/09/17   Allie Bossier, MD  zolpidem (AMBIEN) 5 MG tablet Take 5 mg by mouth at bedtime.     [provider]    Family History Family History  Problem Relation Age of Onset  . Lung cancer Mother   . Heart attack Father     Social History Social History   Tobacco Use  . Smoking status: Never Smoker  . Smokeless tobacco: Never Used  Substance Use Topics  . Alcohol use: Yes    Alcohol/week: 1.8 oz    Types: 3 Glasses of wine  per week    Comment: per week  . Drug use: No     Allergies   Sotalol; Alendronate sodium; Amiodarone; Ciprofloxacin; Coreg [carvedilol]; and Metoprolol   Review of Systems Review of Systems  Respiratory: Positive for shortness of breath.   Neurological: Positive for weakness.  All other systems reviewed and are negative.    Physical Exam Updated Vital Signs BP (!) 143/88   Pulse 80   Temp 97.8 F (36.6 C) (Oral)   Resp (!) 22   Ht 5\' 5"  (1.651 m)   Wt 66.2 kg (146 lb)   SpO2 90%   BMI 24.30 kg/m   Physical Exam  Constitutional:  Chronically ill, tired   HENT:  Head: Normocephalic.  MM dry   Eyes: EOM are normal. Pupils are equal, round, and reactive to light.  Neck: Normal range of motion.  Cardiovascular: Normal rate.  Pulmonary/Chest: Effort normal.  Diminished bilateral bases   Abdominal: Soft. Bowel sounds are normal.  Musculoskeletal: Normal range of motion.       Right lower leg: Cheryl Vargas exhibits edema.       Left lower leg: Cheryl Vargas exhibits edema.  1+ edema bilaterally   Neurological: Cheryl Vargas is alert.  Skin: Skin is warm. Capillary refill takes less than 2 seconds.  Psychiatric: Cheryl Vargas has a normal mood and affect.  Nursing note and vitals reviewed.    ED Treatments / Results  Labs (all labs ordered are listed, but only abnormal results are displayed) Labs Reviewed  CBC WITH DIFFERENTIAL/PLATELET - Abnormal; Notable for the following components:      Result Value   Neutro Abs 8.1 (*)    All other components within normal limits  COMPREHENSIVE METABOLIC PANEL - Abnormal; Notable for the following components:   CO2 20 (*)    Glucose, Bld 117 (*)    Calcium 8.1 (*)    Total Protein 5.9 (*)    Albumin 2.9 (*)    AST 113 (*)    ALT 62 (*)    GFR calc non Af Amer 57 (*)    All other components within normal limits  BRAIN NATRIURETIC PEPTIDE - Abnormal; Notable for the following components:   B Natriuretic Peptide 1,492.6 (*)    All other components within  normal limits  URINALYSIS, ROUTINE W REFLEX MICROSCOPIC - Abnormal; Notable for the following components:   Color, Urine AMBER (*)    APPearance HAZY (*)    Hgb urine dipstick MODERATE (*)    Protein, ur 30 (*)    Leukocytes, UA SMALL (*)    Bacteria, UA FEW (*)    Squamous Epithelial / LPF 0-5 (*)    Non Squamous Epithelial 0-5 (*)    All other components within normal limits  CULTURE, BLOOD (ROUTINE X 2)  CULTURE, BLOOD (ROUTINE X 2)  LIPASE, BLOOD  I-STAT TROPONIN, ED  I-STAT CG4 LACTIC ACID, ED    EKG  EKG Interpretation  Date/Time:  Monday September 17 2017 18:32:55 EST Ventricular Rate:  94 PR Interval:    QRS Duration: 152 QT Interval:  437 QTC Calculation: 514 R Axis:   138 Text Interpretation:  Atrial fibrillation Paired ventricular premature complexes Nonspecific intraventricular conduction delay Probable anteroseptal infarct, old No significant change since last tracing Confirmed by Wandra Arthurs [phone]) on 09/17/2017 6:50:17 PM       Radiology Dg Chest 2 View  Result Date: 09/17/2017 CLINICAL DATA:  Sudden onset shortness of breath EXAM: CHEST  2 VIEW COMPARISON:  09/08/2017 FINDINGS: Biventricular pacer leads from the left in stable position. Small pleural effusions with probable fissural fluid seen in the lateral projection. No Kerley lines. No pneumothorax. No air bronchograms or asymmetric opacity to suggest pneumonia. IMPRESSION: Chronic cardiomegaly and small pleural effusions. Electronically Signed   By: Monte Fantasia M.D.   On: 09/17/2017 19:33   US Abdomen Limited Ruq  Result Date: 09/17/2017 CLINICAL DATA:  Elevated LFTs.  Abdominal pain. EXAM: ULTRASOUND ABDOMEN LIMITED RIGHT UPPER QUADRANT COMPARISON:  None. FINDINGS: Gallbladder: Gallbladder is distended. Diffuse gallbladder wall thickening measuring 6 mm with edematous appearing wall. No shadowing gallstone. Small amount of pericholecystic fluid. No sonographic Murphy sign noted by sonographer. Common bile  duct: Diameter: 3 mm, normal. Liver: No focal lesion identified. Heterogeneous and increased in parenchymal echogenicity. Portal vein is patent on color Doppler imaging with normal direction of blood flow  towards the liver. Small amount perihepatic ascites. Incidental note of 7.4 cm cyst in the upper right kidney with possible internal septation. IMPRESSION: 1. Edematous gallbladder wall thickening measuring up to 6 mm. No gallstones. Small amount pericholecystic fluid and ascites. Findings may be secondary to acalculous cholecystitis. Wall thickening secondary to primary hepatic process such to hepatitis or systemic process such as heart failure could produce a similar appearance. 2. Mild hepatic heterogeneity and increased echogenicity suggesting steatosis. Electronically Signed   By: Jeb Levering M.D.   On: 09/17/2017 23:36    Procedures Procedures (including critical care time)  Medications Ordered in ED Medications  furosemide (LASIX) injection 40 mg (not administered)  piperacillin-tazobactam (ZOSYN) IVPB 3.375 g (not administered)  promethazine (PHENERGAN) injection 12.5 mg (12.5 mg Intravenous Given 09/17/17 1911)  sodium chloride 0.9 % bolus 500 mL (0 mLs Intravenous Stopped 09/17/17 2044)  promethazine (PHENERGAN) injection 12.5 mg (12.5 mg Intravenous Given 09/17/17 2053)  LORazepam (ATIVAN) injection 1 mg (1 mg Intravenous Given 09/17/17 2219)  iopamidol (ISOVUE-300) 61 % injection (100 mLs  Contrast Given 09/17/17 2332)     Initial Impression / Assessment and Plan / ED Course  I have reviewed the triage vital signs and the nursing notes.  Pertinent labs & imaging results that were available during my care of the patient were reviewed by me and considered in my medical decision making (see chart for details).     Cheryl Vargas is a 83 y.o. female here with SOB, weakness, failure to thrive after recent admission for pulmonary edema, V tach arrest. Appears dehydrated. Will check labs,  CXR. Will hydrate and reassess.   11:44 PM BNP 1400. CXR showed small pleural effusion. BNP 1500. LFTs slightly elevated and Cheryl Vargas has some RUQ pain so ordered RUQ [place]. I called Dr. Kenton Kingfisher from cardiology to see patient. Case management involved for placement. Hospitalist to admit.   11:44 PM RUQ [place] came back and showed acalculous cholecystitis. I called Dr. Redmond Pulling from surgery. He recommend IV abx and HIDA in AM. If HIDA positive then consult surgery. Cheryl Vargas is a poor surgical candidate given recent Vtach. Will order zosyn. Updated hospitalist.   Final Clinical Impressions(s) / ED Diagnoses   Final diagnoses:  Elevated LFTs  Acute on chronic systolic congestive heart failure [facility])    ED Discharge Orders    None       Drenda Freeze, MD 09/17/17 2314    Drenda Freeze, MD 09/17/17 [phone]

## 2017-09-17 NOTE — Telephone Encounter (Signed)
I received a page from the answering service to call Cheryl Vargas regarding "extreme shortness of breath and high blood pressure". I called her number back and got her voicemail. I left a message stating that I would call back in 5 minutes if she could answer the phone. I also instructed that if she is having extreme shortness of breath to call 911. She apparently called back to the hospital and got the nurse's desk who did not know that I had called. She told them that she was calling 911. I attempted to call her number 2 more times and got voicemail again. I was not able to reach the patient to speak with her.   Daune Perch, AGNP-C [facility] 09/17/2017  6:46 PM

## 2017-09-17 NOTE — ED Notes (Signed)
Pt care assume, verbal report obtained.  Pt medicated for nausea as ordered.  Went to have her xray done.  Family at bedside

## 2017-09-17 NOTE — Clinical Social Work Note (Addendum)
Clinical Social Work Assessment  Patient Details  Name: Cheryl Vargas MRN: [id number] Date of Birth: 1934-09-25  Date of referral:  09/17/17               Reason for consult:  Facility Placement                Permission sought to share information with:  Facility Art therapist granted to share information::  Yes, Verbal Permission Granted  Name::        Agency::     Relationship::     Contact Information:     Housing/Transportation Living arrangements for the past 2 months:  Apartment Source of Information:  Adult Children Patient Interpreter Needed:  None Criminal Activity/Legal Involvement Pertinent to Current Situation/Hospitalization:    Significant Relationships:  Adult Children, Neighbor, Friend Lives with:  Self Do you feel safe going back to the place where you live?  No Need for family participation in patient care:  Yes (Comment)  Care giving concerns:  Care giving concerns about pt's safety at home. Pt lives alone. CSW and CM met with pt's daughter in pt's room. Pt was at ultra sound. Pt's daughter informed CSW and CM that pt is open to SNF. Pt is struggling at home by herself. She has friends/ neighbors in her senior apartment that check in on her, but they are in their 30s also.   Social Worker assessment / plan:  Pt is being admitted into the hospital. If SNF is deemed appropriate, plan is for daytime CSW to speak with pt and start authorization for SNF placement. Pt expressed wanting [facility] to her daughter. Pt's ED evaluation is still in progress. CSW explained to pt's daughter that pt still needs to be evaluated by Physical Therapy. CSW explained that though SNF placement is desired, pt is still being evaluated to see if that is appropriate.   Employment status:  Retired Forensic scientist:  Other (Comment Required)([employer]) PT Recommendations:  Not assessed at this time Information / Referral to community resources:      Patient/Family's Response to care:  Family is agreeable to plan of care.   Patient/Family's Understanding of and Emotional Response to Diagnosis, Current Treatment, and Prognosis:  No questions or concerns expressed to this CSW at this time.   Emotional Assessment Appearance:    Attitude/Demeanor/Rapport:    Affect (typically observed):    Orientation:    Alcohol / Substance use:    Psych involvement (Current and /or in the community):     Discharge Needs  Concerns to be addressed:  No discharge needs identified Readmission within the last 30 days:  Yes Current discharge risk:  Lives alone Barriers to Discharge:  No Barriers Identified   Wendelyn Breslow, LCSW 09/17/2017, 11:18 PM

## 2017-09-17 NOTE — Telephone Encounter (Signed)
Entered in error

## 2017-09-17 NOTE — ED Notes (Signed)
Blood was attempted to be collected x1 unsuccessfully. RN notified.

## 2017-09-17 NOTE — ED Notes (Signed)
Wanda Case mgr and SW are at bedside

## 2017-09-18 ENCOUNTER — Inpatient Hospital Stay (HOSPITAL_COMMUNITY): Payer: PPO

## 2017-09-18 ENCOUNTER — Other Ambulatory Visit: Payer: Self-pay

## 2017-09-18 ENCOUNTER — Encounter (HOSPITAL_COMMUNITY): Payer: Self-pay | Admitting: Internal Medicine

## 2017-09-18 DIAGNOSIS — I472 Ventricular tachycardia: Secondary | ICD-10-CM

## 2017-09-18 DIAGNOSIS — R109 Unspecified abdominal pain: Secondary | ICD-10-CM | POA: Diagnosis present

## 2017-09-18 DIAGNOSIS — R0600 Dyspnea, unspecified: Secondary | ICD-10-CM

## 2017-09-18 DIAGNOSIS — I5043 Acute on chronic combined systolic (congestive) and diastolic (congestive) heart failure: Secondary | ICD-10-CM

## 2017-09-18 LAB — CBC
HCT: 39.9 % (ref 36.0–46.0)
Hemoglobin: 13.3 g/dL (ref 12.0–15.0)
MCH: 30.6 pg (ref 26.0–34.0)
MCHC: 33.3 g/dL (ref 30.0–36.0)
MCV: 91.7 fL (ref 78.0–100.0)
Platelets: 231 10*3/uL (ref 150–400)
RBC: 4.35 MIL/uL (ref 3.87–5.11)
RDW: 13.7 % (ref 11.5–15.5)
WBC: 9.5 10*3/uL (ref 4.0–10.5)

## 2017-09-18 LAB — HEPATIC FUNCTION PANEL
ALT: 74 U/L — ABNORMAL HIGH (ref 14–54)
AST: 107 U/L — ABNORMAL HIGH (ref 15–41)
Albumin: 3.1 g/dL — ABNORMAL LOW (ref 3.5–5.0)
Alkaline Phosphatase: 92 U/L (ref 38–126)
Bilirubin, Direct: 0.3 mg/dL (ref 0.1–0.5)
Indirect Bilirubin: 1.1 mg/dL — ABNORMAL HIGH (ref 0.3–0.9)
Total Bilirubin: 1.4 mg/dL — ABNORMAL HIGH (ref 0.3–1.2)
Total Protein: 6.4 g/dL — ABNORMAL LOW (ref 6.5–8.1)

## 2017-09-18 LAB — BASIC METABOLIC PANEL
Anion gap: 15 (ref 5–15)
BUN: 17 mg/dL (ref 6–20)
CO2: 21 mmol/L — ABNORMAL LOW (ref 22–32)
Calcium: 8.6 mg/dL — ABNORMAL LOW (ref 8.9–10.3)
Chloride: 102 mmol/L (ref 101–111)
Creatinine, Ser: 0.96 mg/dL (ref 0.44–1.00)
GFR calc Af Amer: >60 mL/min (ref >60)
GFR calc non Af Amer: 54 mL/min — ABNORMAL LOW (ref >60)
Glucose, Bld: 107 mg/dL — ABNORMAL HIGH (ref 65–99)
Potassium: 3.7 mmol/L (ref 3.5–5.1)
Sodium: 138 mmol/L (ref 135–145)

## 2017-09-18 LAB — MAGNESIUM: Magnesium: 1.9 mg/dL (ref 1.7–2.4)

## 2017-09-18 LAB — CBG MONITORING, ED
Glucose-Capillary: 103 mg/dL — ABNORMAL HIGH (ref 65–99)
Glucose-Capillary: 129 mg/dL — ABNORMAL HIGH (ref 65–99)

## 2017-09-18 LAB — I-STAT CG4 LACTIC ACID, ED
Lactic Acid, Venous: 2.2 mmol/L (ref 0.5–1.9)
Lactic Acid, Venous: 2.59 mmol/L (ref 0.5–1.9)

## 2017-09-18 MED ORDER — FUROSEMIDE 10 MG/ML IJ SOLN
40.0000 mg | Freq: Two times a day (BID) | INTRAMUSCULAR | Status: AC
  Administered 2017-09-18 – 2017-09-19 (×2): 40 mg via INTRAVENOUS
  Filled 2017-09-18 (×2): qty 4

## 2017-09-18 MED ORDER — ACETAMINOPHEN 650 MG RE SUPP: 650.0000 mg | Freq: Four times a day (QID) | RECTAL | Status: DC | PRN

## 2017-09-18 MED ORDER — BISOPROLOL FUMARATE 5 MG PO TABS
10.0000 mg | ORAL_TABLET | Freq: Every day | ORAL | Status: DC
  Administered 2017-09-18 – 2017-09-20 (×3): 10 mg via ORAL
  Filled 2017-09-18 (×3): qty 2
  Filled 2017-09-18 (×2): qty 1

## 2017-09-18 MED ORDER — ZOLPIDEM TARTRATE 5 MG PO TABS
5.0000 mg | ORAL_TABLET | Freq: Every day | ORAL | Status: DC
  Administered 2017-09-18 – 2017-09-19 (×2): 5 mg via ORAL
  Filled 2017-09-18 (×2): qty 1

## 2017-09-18 MED ORDER — ACETAMINOPHEN 325 MG PO TABS
650.0000 mg | ORAL_TABLET | Freq: Four times a day (QID) | ORAL | Status: DC | PRN
  Administered 2017-09-20 (×2): 650 mg via ORAL
  Filled 2017-09-18 (×2): qty 2

## 2017-09-18 MED ORDER — TECHNETIUM TC 99M MEBROFENIN IV KIT
5.0000 | PACK | Freq: Once | INTRAVENOUS | Status: AC | PRN
  Administered 2017-09-18: 5 via INTRAVENOUS

## 2017-09-18 MED ORDER — ONDANSETRON HCL 4 MG/2ML IJ SOLN
4.0000 mg | Freq: Four times a day (QID) | INTRAMUSCULAR | Status: DC | PRN
  Filled 2017-09-18: qty 2

## 2017-09-18 MED ORDER — PIPERACILLIN-TAZOBACTAM 3.375 G IVPB
3.3750 g | Freq: Three times a day (TID) | INTRAVENOUS | Status: DC
  Administered 2017-09-18 (×2): 3.375 g via INTRAVENOUS
  Filled 2017-09-18 (×4): qty 50

## 2017-09-18 MED ORDER — ZOLPIDEM TARTRATE 5 MG PO TABS
5.0000 mg | ORAL_TABLET | Freq: Once | ORAL | Status: AC
  Administered 2017-09-18: 5 mg via ORAL
  Filled 2017-09-18 (×2): qty 1

## 2017-09-18 MED ORDER — ONDANSETRON HCL 4 MG PO TABS: 4.0000 mg | ORAL_TABLET | Freq: Four times a day (QID) | ORAL | Status: DC | PRN

## 2017-09-18 MED ORDER — FUROSEMIDE 10 MG/ML IJ SOLN: 40.0000 mg | Freq: Two times a day (BID) | INTRAMUSCULAR | Status: DC

## 2017-09-18 NOTE — ED Notes (Signed)
Patient is transported to xray for Nuc Med study

## 2017-09-18 NOTE — ED Notes (Signed)
Returned from u/s

## 2017-09-18 NOTE — ED Notes (Signed)
Dr Hal Hope paged for critical lactic acid result, awaiting response

## 2017-09-18 NOTE — H&P (Signed)
History and Physical    Cheryl Vargas [name] DOB: 10-27-1934 DOA: 09/17/2017  PCP: Lavone Orn, MD  Patient coming from: Home.  Chief Complaint: Dizziness and nausea vomiting.  HPI: Cheryl Vargas is a 83 y.o. 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  Patient states since discharge last week patient has been feeling fatigued but last 24 hours has become more worse with nausea vomiting and upper abdominal pain like a band.  Has been having some diarrhea.  Denies any productive cough fever or chills.  ED Course: In the ER chest x-ray shows cardiomegaly with small pleural effusion.  BNP was elevated.  LFTs were mildly elevated.  On exam patient has epigastric tenderness.  Sonogram of the abdomen was done which was concerning for acalculous cholecystitis.  This was followed by CT of the abdomen and pelvis which was showing features concerning for possible acalculous cholecystitis versus congestion from CHF versus hepatic malignancy.  There also was a exophytic lesion in the right kidney.  Cardiology was consulted.  Patient was given Lasix 40 mg in the ER.  On my exam patient is not in distress.  Does have epigastric tenderness.  On-call general surgeon Dr. Redmond Pulling was consulted and requested HIDA scan.  Review of Systems: As per HPI, rest all negative.   Past Medical History:  Diagnosis Date  . Arthritis   . Chronic combined systolic (congestive) and diastolic (congestive) heart failure ([place])   . Chronic systolic (congestive) heart failure ([place])   . Dysrhythmia    pvc's per pt  . Enlarged heart   . Mitral regurgitation   . Non-ischemic cardiomyopathy ([place])   . Paroxysmal A-fib ([place])   . Paroxysmal atrial fibrillation ([facility])   . Persistent atrial fibrillation  ([place])   . PONV (postoperative nausea and vomiting)     Past Surgical History:  Procedure Laterality Date  . AV NODE ABLATION N/A 09/07/2017   Procedure: AV NODE ABLATION;  Surgeon: [name], MD;  Location: [place] [facility];  Service: Cardiovascular;  Laterality: N/A;  . BIV ICD INSERTION CRT-D N/A 09/07/2017   Procedure: BIV ICD INSERTION CRT-D;  Surgeon: [name], MD;  Location: [place] [facility];  Service: Cardiovascular;  Laterality: N/A;  . BUNIONECTOMY    . CARDIAC CATHETERIZATION     in 2004 at [facility]. "Insignificant blockage" per patient  . CARDIAC CATHETERIZATION N/A 04/25/2016   Procedure: Left Heart Cath and Coronary Angiography;  Surgeon: Nelva Bush, MD;  Location: [place] [facility];  Service: Cardiovascular;  Laterality: N/A;  . CARDIOVERSION N/A 06/29/2017   Procedure: CARDIOVERSION;  Surgeon: Larey Dresser, MD;  Location: [facility] ENDOSCOPY;  Service: Cardiovascular;  Laterality: N/A;  . CARDIOVERSION N/A 08/28/2017   Procedure: CARDIOVERSION;  Surgeon: Larey Dresser, MD;  Location: [facility] ENDOSCOPY;  Service: Cardiovascular;  Laterality: N/A;  . SHOULDER SURGERY     closed reduction  . TEE WITHOUT CARDIOVERSION N/A 06/29/2017   Procedure: TRANSESOPHAGEAL ECHOCARDIOGRAM (TEE);  Surgeon: Larey Dresser, MD;  Location: [facility] ENDOSCOPY;  Service: Cardiovascular;  Laterality: N/A;  . TONSILLECTOMY    . TOTAL HIP ARTHROPLASTY Left 03/18/2013   Procedure: TOTAL HIP ARTHROPLASTY ANTERIOR APPROACH;  Surgeon: Mauri Pole, MD;  Location: [facility];  Service: Orthopedics;  Laterality: Left;  . TUBAL LIGATION  reports that  has never smoked. she has never used smokeless tobacco. She reports that she drinks about 1.8 oz of alcohol per week. She reports that she does not use drugs.  Allergies  Allergen Reactions  . Sotalol Other (See Comments)    Prolonged QTc  . Alendronate Sodium Other (See Comments)    Arm pain  . Amiodarone Nausea Only  . Ciprofloxacin  Other (See Comments)    Not effective  . Coreg [Carvedilol] Other (See Comments)    Fatigue   . Metoprolol Other (See Comments)    Profound fatigue    Family History  Problem Relation Age of Onset  . Lung cancer Mother   . Heart attack Father     Prior to Admission medications   Medication Sig Start Date End Date Taking? Authorizing Provider  acetaminophen (TYLENOL) 500 MG tablet Take 500 mg by mouth every 6 (six) hours as needed for moderate pain.    [provider]  bisoprolol (ZEBETA) 10 MG tablet Take 1 tablet (10 mg total) by mouth daily. 09/10/17   Allie Bossier, MD  calcium carbonate (TUMS - DOSED IN MG ELEMENTAL CALCIUM) 500 MG chewable tablet Chew 1-2 tablets (200-400 mg of elemental calcium total) by mouth as needed for indigestion or heartburn. 09/09/17   Allie Bossier, MD  docusate sodium (COLACE) 100 MG capsule Take 100 mg by mouth daily as needed for mild constipation.    [provider]  fluticasone (FLONASE) 50 MCG/ACT nasal spray Place 1 spray into both nostrils daily as needed for allergies or rhinitis.    [provider]  furosemide (LASIX) 20 MG tablet Take 1 tablet (20 mg total) by mouth as needed for fluid or edema. Patient taking differently: Take 20 mg by mouth daily as needed for fluid or edema.  03/05/17   Larey Dresser, MD  naphazoline-pheniramine (NAPHCON-A) 0.025-0.3 % ophthalmic solution Place 1 drop into both eyes daily as needed (dry eyes).     [provider]  pantoprazole (PROTONIX) 40 MG tablet Take 1 tablet (40 mg total) by mouth daily. 09/10/17   Allie Bossier, MD  Rivaroxaban (XARELTO) 15 MG TABS tablet Take 1 tablet (15 mg total) by mouth daily with supper. RESUME ON 2 JAN 09/09/17   Allie Bossier, MD  zolpidem (AMBIEN) 5 MG tablet Take 5 mg by mouth at bedtime.     [provider]    Physical Exam: Vitals:   09/17/17 2215 09/17/17 2300 09/18/17 0000 09/18/17 0100  BP: (!) 151/92 (!) 143/88  122/77 (!) 138/92  Pulse: 82 80 85 80  Resp: (!) 24 (!) 22 (!) 22 19  Temp:      TempSrc:      SpO2: 95% 90% 95% 95%  Weight:      Height:          Constitutional: Moderately built and nourished. Vitals:   09/17/17 2215 09/17/17 2300 09/18/17 0000 09/18/17 0100  BP: (!) 151/92 (!) 143/88 122/77 (!) 138/92  Pulse: 82 80 85 80  Resp: (!) 24 (!) 22 (!) 22 19  Temp:      TempSrc:      SpO2: 95% 90% 95% 95%  Weight:      Height:       Eyes: Anicteric no pallor. ENMT: No discharge from the ears eyes nose or mouth. Neck: No mass palpated no neck rigidity no JVD appreciated. Respiratory: No rhonchi or crepitations. Cardiovascular: S1-S2 heard no murmurs appreciated. Abdomen:  Epigastric tenderness no guarding or rigidity. Musculoskeletal: No edema.  No joint effusion. Skin: No rash.  Skin appears warm. Neurologic: Alert awake oriented to time place and person.  Moves all extremities. Psychiatric: Appears normal.  Normal affect.   Labs on Admission: I have personally reviewed following labs and imaging studies  CBC: Recent Labs  Lab 09/17/17 2038  WBC 9.7  NEUTROABS 8.1*  HGB 13.6  HCT 40.4  MCV 92.7  PLT 106   Basic Metabolic Panel: Recent Labs  Lab 09/17/17 2038  NA 138  K 3.9  CL 109  CO2 20*  GLUCOSE 117*  BUN 18  CREATININE 0.93  CALCIUM 8.1*   GFR: Estimated Creatinine Clearance: 43.4 mL/min (by C-G formula based on SCr of 0.93 mg/dL). Liver Function Tests: Recent Labs  Lab 09/17/17 2038  AST 113*  ALT 62*  ALKPHOS 88  BILITOT 1.1  PROT 5.9*  ALBUMIN 2.9*   Recent Labs  Lab 09/17/17 2038  LIPASE 27   No results for input(s): AMMONIA in the last 168 hours. Coagulation Profile: No results for input(s): INR, PROTIME in the last 168 hours. Cardiac Enzymes: No results for input(s): CKTOTAL, CKMB, CKMBINDEX, TROPONINI in the last 168 hours. BNP (last 3 results) No results for input(s): PROBNP in the last 8760 hours. HbA1C: No results for  input(s): HGBA1C in the last 72 hours. CBG: No results for input(s): GLUCAP in the last 168 hours. Lipid Profile: No results for input(s): CHOL, HDL, LDLCALC, TRIG, CHOLHDL, LDLDIRECT in the last 72 hours. Thyroid Function Tests: No results for input(s): TSH, T4TOTAL, FREET4, T3FREE, THYROIDAB in the last 72 hours. Anemia Panel: No results for input(s): VITAMINB12, FOLATE, FERRITIN, TIBC, IRON, RETICCTPCT in the last 72 hours. Urine analysis:    Component Value Date/Time   COLORURINE AMBER (A) 09/17/2017 1938   APPEARANCEUR HAZY (A) 09/17/2017 1938   LABSPEC 1.026 09/17/2017 1938   PHURINE 5.0 09/17/2017 1938   GLUCOSEU NEGATIVE 09/17/2017 1938   HGBUR MODERATE (A) 09/17/2017 1938   BILIRUBINUR NEGATIVE 09/17/2017 1938   KETONESUR NEGATIVE 09/17/2017 1938   PROTEINUR 30 (A) 09/17/2017 1938   UROBILINOGEN 0.2 03/17/2013 1423   NITRITE NEGATIVE 09/17/2017 1938   LEUKOCYTESUR SMALL (A) 09/17/2017 1938   Sepsis Labs: @LABRCNTIP (procalcitonin:4,lacticidven:4) )No results found for this or any previous visit (from the past 240 hour(s)).   Radiological Exams on Admission: Dg Chest 2 View  Result Date: 09/17/2017 CLINICAL DATA:  Sudden onset shortness of breath EXAM: CHEST  2 VIEW COMPARISON:  09/08/2017 FINDINGS: Biventricular pacer leads from the left in stable position. Small pleural effusions with probable fissural fluid seen in the lateral projection. No Kerley lines. No pneumothorax. No air bronchograms or asymmetric opacity to suggest pneumonia. IMPRESSION: Chronic cardiomegaly and small pleural effusions. Electronically Signed   By: Monte Fantasia M.D.   On: 09/17/2017 19:33   Ct Abdomen Pelvis W Contrast  Result Date: 09/18/2017 CLINICAL DATA:  Nausea and weakness. EXAM: CT ABDOMEN AND PELVIS WITH CONTRAST TECHNIQUE: Multidetector CT imaging of the abdomen and pelvis was performed using the standard protocol following bolus administration of intravenous contrast. CONTRAST:  141mL  ISOVUE-300 IOPAMIDOL (ISOVUE-300) INJECTION 61% COMPARISON:  Right upper quadrant ultrasound from the same date. FINDINGS: Lower chest: Bilateral pleural effusions, right greater than left. Right lower lobe atelectasis. Single enlarged peri diaphragmatic lymph node measuring 13 mm in short axis. Hepatobiliary: Periportal edema. 1.2 cm hypoattenuated lesion in the right lobe of the liver measures water density and therefore likely represents a  cyst. Indistinct edematous wall of the gallbladder with moderate amount of pericholecystic fluid. Pancreas: Unremarkable. No pancreatic ductal dilatation or surrounding inflammatory changes. Spleen: Normal in size without focal abnormality. Adrenals/Urinary Tract: Normal appearance of the adrenal glands. Bilateral cortical thinning. Large exophytic cyst off of the upper pole of the right kidney measuring 7.8 cm, which contains a calcified septum. Stomach/Bowel: Stomach is within normal limits. Appendix appears normal. No evidence of bowel wall thickening, distention, or inflammatory changes. Vascular/Lymphatic: Aortic atherosclerosis. No enlarged abdominal or pelvic lymph nodes. Reproductive: Female genitalia obscured by artifact from left hip arthroplasty. Other: No abdominal wall hernia or abnormality. No abdominopelvic ascites. Musculoskeletal: Multilevel osteoarthritic changes of the spine. IMPRESSION: Indistinctness of the wall of the gallbladder with marked pericholecystic fluid. Associated periportal edema, particularly prominent in the liver hilum. These findings may be secondary to acalculous cholecystitis versus primary cholangiocarcinoma or hepatocellular carcinoma within the liver hilum. Alternatively, vascular injury to the liver and gallbladder, or liver congestion secondary to heart failure may produce similar appearance. The main portal vein and main hepatic artery are patent. Single enlarged para diaphragmatic lymph node. Large exophytic right renal cyst  containing a calcified septum. Short-term follow-up with renal ultrasound may be considered. Bilateral pleural effusions, right greater than left. Electronically Signed   By: Fidela Salisbury M.D.   On: 09/18/2017 00:51   US Abdomen Limited Ruq  Result Date: 09/17/2017 CLINICAL DATA:  Elevated LFTs.  Abdominal pain. EXAM: ULTRASOUND ABDOMEN LIMITED RIGHT UPPER QUADRANT COMPARISON:  None. FINDINGS: Gallbladder: Gallbladder is distended. Diffuse gallbladder wall thickening measuring 6 mm with edematous appearing wall. No shadowing gallstone. Small amount of pericholecystic fluid. No sonographic Murphy sign noted by sonographer. Common bile duct: Diameter: 3 mm, normal. Liver: No focal lesion identified. Heterogeneous and increased in parenchymal echogenicity. Portal vein is patent on color Doppler imaging with normal direction of blood flow towards the liver. Small amount perihepatic ascites. Incidental note of 7.4 cm cyst in the upper right kidney with possible internal septation. IMPRESSION: 1. Edematous gallbladder wall thickening measuring up to 6 mm. No gallstones. Small amount pericholecystic fluid and ascites. Findings may be secondary to acalculous cholecystitis. Wall thickening secondary to primary hepatic process such to hepatitis or systemic process such as heart failure could produce a similar appearance. 2. Mild hepatic heterogeneity and increased echogenicity suggesting steatosis. Electronically Signed   By: Jeb Levering M.D.   On: 09/17/2017 23:36    EKG: Independently reviewed.  A. fib rate controlled.  Assessment/Plan Principal Problem:   Abdominal pain Active Problems:   Paroxysmal atrial fibrillation (HCC)   Acute on chronic combined systolic (congestive) and diastolic (congestive) heart failure (HCC)   Severe mitral valve regurgitation   CHF (congestive heart failure) ([place])    1. Abdominal pain with nausea vomiting -concerning for cholecystitis.  General surgery was  consulted and at this time to have requested HIDA scan.  HIDA scan has been ordered.  Patient will be on empiric antibiotics.  Patient will be kept n.p.o. except medications.  Note that patient has taken his Xarelto yesterday evening around 5 PM.  Any surgery can be done only after 24 hours at least. 2. Abnormal CT of the abdomen concerning for hepatocellular carcinoma versus cholangiocarcinoma versus a calculus cholecystitis versus passive congestion from CHF.  Will await HIDA scan results.  Will await surgery input.  Follow LFTs. 3. Chronic combined systolic and diastolic CHF recent ICD placement after V. tach arrest -as per cardiology to hold Lasix as patient appears dehydrated.  4. A. fib rate controlled -continue rate limiting medications but will hold Xarelto in anticipation of surgery. 5. Severe mitral regurgitation -being followed by cardiology.  6. Exophytic lesion in the right kidney will need further workup as outpatient.   DVT prophylaxis: SCDs. Code Status: Full code. Family Communication: Discussed with patient. Disposition Plan: Home. Consults called: Cardiology and general surgery. Admission status: Inpatient.   Rise Patience MD [facility] Pager [phone].  If 7PM-7AM, please contact night-coverage [email or web address] Password [email or web address]  09/18/2017, 2:02 AM

## 2017-09-18 NOTE — ED Notes (Signed)
Transported to xray for u/s

## 2017-09-18 NOTE — ED Notes (Signed)
Returned from xray

## 2017-09-18 NOTE — Progress Notes (Signed)
Progress Note  Patient Name: Cheryl Vargas Date of Encounter: 09/18/2017  Primary Cardiologist: [name], MD  CHF: Dr. Aundra Dubin  Subjective   Breathing improved. No chest pain. Nausea better.   Inpatient Medications    Scheduled Meds: . bisoprolol  10 mg Oral Daily  . zolpidem  5 mg Oral QHS   Continuous Infusions: . piperacillin-tazobactam (ZOSYN)  IV Stopped (09/18/17 0622)   PRN Meds: acetaminophen **OR** acetaminophen, ondansetron **OR** ondansetron (ZOFRAN) IV   Vital Signs    Vitals:   09/18/17 0300 09/18/17 0400 09/18/17 0500 09/18/17 0600  BP: 120/82 126/71 135/82 139/80  Pulse: 80 80 80 80  Resp: (!) 25 16 (!) 32 20  Temp:      TempSrc:      SpO2: 96% 91% 95% 98%  Weight:      Height:        Intake/Output Summary (Last 24 hours) at 09/18/2017 1152 Last data filed at 09/18/2017 0622 Gross per 24 hour  Intake 600 ml  Output 1500 ml  Net -900 ml   Filed Weights   09/17/17 1839  Weight: 146 lb (66.2 kg)    Telemetry    Atrial flutter at rate of 80s - Personally Reviewed  ECG    None today   Physical Exam   GEN: No acute distress.   Neck: + JVD Cardiac: RRR, systolic murmurs, rubs, or gallops.  Respiratory: Clear to auscultation bilaterally. GI: Soft, tender @ RUQ and epigastric area, distended  MS: No edema; No deformity. Neuro:  Nonfocal  Psych: Normal affect   Labs    Chemistry Recent Labs  Lab 09/17/17 2038 09/18/17 0418  NA 138 138  K 3.9 3.7  CL 109 102  CO2 20* 21*  GLUCOSE 117* 107*  BUN 18 17  CREATININE 0.93 0.96  CALCIUM 8.1* 8.6*  PROT 5.9* 6.4*  ALBUMIN 2.9* 3.1*  AST 113* 107*  ALT 62* 74*  ALKPHOS 88 92  BILITOT 1.1 1.4*  GFRNONAA 57* 54*  GFRAA >60 >60  ANIONGAP 9 15     Hematology Recent Labs  Lab 09/17/17 2038 09/18/17 0418  WBC 9.7 9.5  RBC 4.36 4.35  HGB 13.6 13.3  HCT 40.4 39.9  MCV 92.7 91.7  MCH 31.2 30.6  MCHC 33.7 33.3  RDW 14.3 13.7  PLT 210 231    Recent Labs  Lab  09/17/17 2053  TROPIPOC 0.03     BNP Recent Labs  Lab 09/17/17 2038  BNP 1,492.6*    Radiology    Dg Chest 2 View  Result Date: 09/17/2017 CLINICAL DATA:  Sudden onset shortness of breath EXAM: CHEST  2 VIEW COMPARISON:  09/08/2017 FINDINGS: Biventricular pacer leads from the left in stable position. Small pleural effusions with probable fissural fluid seen in the lateral projection. No Kerley lines. No pneumothorax. No air bronchograms or asymmetric opacity to suggest pneumonia. IMPRESSION: Chronic cardiomegaly and small pleural effusions. Electronically Signed   By: Monte Fantasia M.D.   On: 09/17/2017 19:33   Ct Abdomen Pelvis W Contrast  Result Date: 09/18/2017 CLINICAL DATA:  Nausea and weakness. EXAM: CT ABDOMEN AND PELVIS WITH CONTRAST TECHNIQUE: Multidetector CT imaging of the abdomen and pelvis was performed using the standard protocol following bolus administration of intravenous contrast. CONTRAST:  118mL ISOVUE-300 IOPAMIDOL (ISOVUE-300) INJECTION 61% COMPARISON:  Right upper quadrant ultrasound from the same date. FINDINGS: Lower chest: Bilateral pleural effusions, right greater than left. Right lower lobe atelectasis. Single enlarged peri diaphragmatic lymph node  measuring 13 mm in short axis. Hepatobiliary: Periportal edema. 1.2 cm hypoattenuated lesion in the right lobe of the liver measures water density and therefore likely represents a cyst. Indistinct edematous wall of the gallbladder with moderate amount of pericholecystic fluid. Pancreas: Unremarkable. No pancreatic ductal dilatation or surrounding inflammatory changes. Spleen: Normal in size without focal abnormality. Adrenals/Urinary Tract: Normal appearance of the adrenal glands. Bilateral cortical thinning. Large exophytic cyst off of the upper pole of the right kidney measuring 7.8 cm, which contains a calcified septum. Stomach/Bowel: Stomach is within normal limits. Appendix appears normal. No evidence of bowel wall  thickening, distention, or inflammatory changes. Vascular/Lymphatic: Aortic atherosclerosis. No enlarged abdominal or pelvic lymph nodes. Reproductive: Female genitalia obscured by artifact from left hip arthroplasty. Other: No abdominal wall hernia or abnormality. No abdominopelvic ascites. Musculoskeletal: Multilevel osteoarthritic changes of the spine. IMPRESSION: Indistinctness of the wall of the gallbladder with marked pericholecystic fluid. Associated periportal edema, particularly prominent in the liver hilum. These findings may be secondary to acalculous cholecystitis versus primary cholangiocarcinoma or hepatocellular carcinoma within the liver hilum. Alternatively, vascular injury to the liver and gallbladder, or liver congestion secondary to heart failure may produce similar appearance. The main portal vein and main hepatic artery are patent. Single enlarged para diaphragmatic lymph node. Large exophytic right renal cyst containing a calcified septum. Short-term follow-up with renal ultrasound may be considered. Bilateral pleural effusions, right greater than left. Electronically Signed   By: Fidela Salisbury M.D.   On: 09/18/2017 00:51   US Abdomen Limited Ruq  Result Date: 09/17/2017 CLINICAL DATA:  Elevated LFTs.  Abdominal pain. EXAM: ULTRASOUND ABDOMEN LIMITED RIGHT UPPER QUADRANT COMPARISON:  None. FINDINGS: Gallbladder: Gallbladder is distended. Diffuse gallbladder wall thickening measuring 6 mm with edematous appearing wall. No shadowing gallstone. Small amount of pericholecystic fluid. No sonographic Murphy sign noted by sonographer. Common bile duct: Diameter: 3 mm, normal. Liver: No focal lesion identified. Heterogeneous and increased in parenchymal echogenicity. Portal vein is patent on color Doppler imaging with normal direction of blood flow towards the liver. Small amount perihepatic ascites. Incidental note of 7.4 cm cyst in the upper right kidney with possible internal septation.  IMPRESSION: 1. Edematous gallbladder wall thickening measuring up to 6 mm. No gallstones. Small amount pericholecystic fluid and ascites. Findings may be secondary to acalculous cholecystitis. Wall thickening secondary to primary hepatic process such to hepatitis or systemic process such as heart failure could produce a similar appearance. 2. Mild hepatic heterogeneity and increased echogenicity suggesting steatosis. Electronically Signed   By: Jeb Levering M.D.   On: 09/17/2017 23:36    Cardiac Studies   Echo 09/04/17 Study Conclusions  - Left ventricle: The cavity size was mildly dilated. Wall   thickness was normal. Systolic function was severely reduced. The   estimated ejection fraction was in the range of 25% to 30%.   Diffuse hypokinesis. - Ventricular septum: Septal motion showed abnormal function and   dyssynergy. - Aortic valve: Trileaflet; mildly thickened, mildly calcified   leaflets. There was mild regurgitation. - Mitral valve: There was moderate regurgitation. - Left atrium: The atrium was severely dilated. Volume/bsa, ES,   (1-plane Simpson&'s, A2C): 66.3 ml/m^2. - Right atrium: The atrium was moderately dilated. - Tricuspid valve: There was moderate regurgitation. - Pulmonary arteries: Systolic pressure was mildly increased.  Impressions:  - EF is reduced when compared to prior study.  Patient Profile     83 y.o. female perment atrial fibrillation, VT s/p recent BiV ICD, chronic systolic CHF  presented for progressive weakness and fatigue with nausea vomiting and poor appetite. She also has dyspnea. CXr with small pleural effusion. Elevated LFTs. by CT of the abdomen and pelvis which was showing features concerning for possible acalculous cholecystitis versus congestion from CHF versus hepatic malignancy. Breathing improved with IV lasix x 1. Pending HIDA scan. Device interrogation demonstrated normally functioning device  Assessment & Plan    1. Acute on  chronic systolic CHF - BNP of 8381. Breathing improved with IV lasix x 1. Diuresed 900cc.  - She looks euvolemic.   2. Abdominal pain with nausea - abnormal CT of abdomen as above. Pending result of HIDA scan.   3. Persistent atrial fibrination/flutter - Rate controlled. Last dose of Xarelto PM of 09/17/17. CHADSVASc score of 5.   4. Moderate MR - followed with serial echo For questions or updates, please contact [place] Please consult [email or web address] for contact info under Cardiology/STEMI.      Jarrett Soho, PA  09/18/2017, 11:52 AM    Patient seen earlier today in consultation.  Other findings as above.  We will follow during this hospitalization.  Minus Breeding  09/18/2017

## 2017-09-18 NOTE — ED Notes (Signed)
Pt to imaging

## 2017-09-18 NOTE — Consult Note (Signed)
CARDIOLOGY CONSULT  Physician Requesting Consult:  Shirlyn Goltz, MD  HPI:  Cheryl Vargas is a 83 y.o. old female who was recently discharged after having atrial fibrillation followed by VT requiring ICD implant for secondary prevention presents today with complaints of weakness and fatigue.  This has progressively gotten worse she came home from the hospital.  She has had decreased PO intake as well as profound nausea and vomiting.  This afternoon she's had multiple episodes of loose stools and abdominal pain.  She has not had syncope though yesterday she states she had a periods where she thought she might.  This was associated with nausea and diaphoresis.  She denies chest pain.  Device interrogation demonstrated normally functioning device.  No episodes of VT/VF noted.  No sensed events down to HR of 50.  Impedence of 437 and 513 on the RV and LV leads respectively.  RV and LV lead pacing threshold at 0.5 ms @ 0.40 ms.  BiV Pacing 100% of the time.  Assessment/Plan  Dyspnea   Assessment:  Patient presents with progressive dyspnea which started shortly after discharge home from the hospital.  CXR is grossly unchanged without evidence of significant interstitial edema.  There are small bilateral pleural effusions.  Her oral cavity is dry and there is no evidence of lower extremity edema.  Pulmonary exam though diminished in the bases bilateral, is not significant for rales.     Plan  -  Would not diuresis her as she is clinically dry and her lactate is mildly elevated  -  Would continue bisoprolol  -  Hold Xarelto in light of possible surgery for her gallbladder  -  Would DC ambien for trazodone to help her sleep   Ventricular tachycardia now s/p BiV ICD implant   Assessment:  No further events noted on her device.     Plan  -  Continue current medical management as noted above  Past Cardiovascular History:  - No documented h/o CAD - No documented h/o MI - No documented h/o CHF - No  documented h/o PVD - No documented h/o AAA - No documented h/o valvular heart disease - No documented h/o CVA - No documented h/o Arrhythmias - No documented h/o A-fib  - No documented h/o congenital heart disease - No documented h/o CABG - No documented h/o PCI - No documented h/o cardiac devices (Pacer/ICD/CRT) - No documented h/o cardiac surgery       Most recent stress test:  None  Most recent echocardiography:   09/04/17 - Left ventricle: The cavity size was mildly dilated. Wall   thickness was normal. Systolic function was severely reduced. The   estimated ejection fraction was in the range of 25% to 30%.   Diffuse hypokinesis. - Ventricular septum: Septal motion showed abnormal function and   dyssynergy. - Aortic valve: Trileaflet; mildly thickened, mildly calcified   leaflets. There was mild regurgitation. - Mitral valve: There was moderate regurgitation. - Left atrium: The atrium was severely dilated. Volume/bsa, ES,   (1-plane Simpson&'s, A2C): 66.3 ml/m^2. - Right atrium: The atrium was moderately dilated. - Tricuspid valve: There was moderate regurgitation. - Pulmonary arteries: Systolic pressure was mildly increased.  Most recent left heart catheterization:  None  CABG:  Date/ Physician: None  Device history:  None  Past Medical History:  Diagnosis Date  . Arthritis   . Chronic combined systolic (congestive) and diastolic (congestive) heart failure ([place])   . Chronic systolic (congestive) heart failure ([place])   .  Dysrhythmia    pvc's per pt  . Enlarged heart   . Mitral regurgitation   . Non-ischemic cardiomyopathy ([place])   . Paroxysmal A-fib ([place])   . Paroxysmal atrial fibrillation ([facility])   . Persistent atrial fibrillation ([place])   . PONV (postoperative nausea and vomiting)     Past Surgical History:  Procedure Laterality Date  . AV NODE ABLATION N/A 09/07/2017   Procedure: AV NODE ABLATION;  Surgeon: [name], MD;  Location: [place] [facility];   Service: Cardiovascular;  Laterality: N/A;  . BIV ICD INSERTION CRT-D N/A 09/07/2017   Procedure: BIV ICD INSERTION CRT-D;  Surgeon: [name], MD;  Location: [place] [facility];  Service: Cardiovascular;  Laterality: N/A;  . BUNIONECTOMY    . CARDIAC CATHETERIZATION     in 2004 at [facility]. "Insignificant blockage" per patient  . CARDIAC CATHETERIZATION N/A 04/25/2016   Procedure: Left Heart Cath and Coronary Angiography;  Surgeon: Nelva Bush, MD;  Location: [place] [facility];  Service: Cardiovascular;  Laterality: N/A;  . CARDIOVERSION N/A 06/29/2017   Procedure: CARDIOVERSION;  Surgeon: Larey Dresser, MD;  Location: [facility] ENDOSCOPY;  Service: Cardiovascular;  Laterality: N/A;  . CARDIOVERSION N/A 08/28/2017   Procedure: CARDIOVERSION;  Surgeon: Larey Dresser, MD;  Location: [facility] ENDOSCOPY;  Service: Cardiovascular;  Laterality: N/A;  . SHOULDER SURGERY     closed reduction  . TEE WITHOUT CARDIOVERSION N/A 06/29/2017   Procedure: TRANSESOPHAGEAL ECHOCARDIOGRAM (TEE);  Surgeon: Larey Dresser, MD;  Location: [facility] ENDOSCOPY;  Service: Cardiovascular;  Laterality: N/A;  . TONSILLECTOMY    . TOTAL HIP ARTHROPLASTY Left 03/18/2013   Procedure: TOTAL HIP ARTHROPLASTY ANTERIOR APPROACH;  Surgeon: Mauri Pole, MD;  Location: [facility];  Service: Orthopedics;  Laterality: Left;  . TUBAL LIGATION      Social History   Socioeconomic History  . Marital status: Widowed    Spouse name: Not on file  . Number of children: Not on file  . Years of education: Not on file  . Highest education level: Not on file  Social Needs  . Financial resource strain: Not on file  . Food insecurity - worry: Not on file  . Food insecurity - inability: Not on file  . Transportation needs - medical: Not on file  . Transportation needs - non-medical: Not on file  Occupational History  . Not on file  Tobacco Use  . Smoking status: Never Smoker  . Smokeless tobacco: Never Used  Substance and Sexual  Activity  . Alcohol use: Yes    Alcohol/week: 1.8 oz    Types: 3 Glasses of wine per week    Comment: per week  . Drug use: No  . Sexual activity: Not on file  Other Topics Concern  . Not on file  Social History Narrative  . Not on file    Family History  Problem Relation Age of Onset  . Lung cancer Mother   . Heart attack Father      Intake/Output Summary (Last 24 hours) at 09/18/2017 0032 Last data filed at 09/17/2017 2044 Gross per 24 hour  Intake 500 ml  Output -  Net 500 ml    MEDS:  piperacillin-tazobactam Last Rate: 3.375 g (09/18/17 0010)      Review of Systems:  GEN: no fever, chills, nausea, vomiting, weight change  HEENT: no vision or hearing changes  PULM: no coughing, +SOB  CV: no chest pain, palpitations, PND, orthopnea  GI: +abdominal pain  GU: no dysuria  EXT: no swelling  SKIN: no rashes  NEURO: no numbness or tingling  HEME: no bleeding or bruising  GYN: none  --12 point review systems- otherwise negative.  Physical Examination: Blood pressure 122/77, pulse 85, temperature 97.8 F (36.6 C), temperature source Oral, resp. rate (!) 22, height 5\' 5"  (1.651 m), weight 66.2 kg (146 lb), SpO2 95 %. General:  AAOX 4.  NAD.  NRD.   HENT: Normocephalic. Atraumatic.  No acute abnom.  Mouth is dry EYES: PERRL EOMI  Neck: Supple.  +JVD.  No bruits. Cardiovascular:  Nl S1. Nl S2. No S3. No S4. 2/6 systolic murmur,  RRR  Pulmonary/Chest: Diminished in the bases.  No rales. No wheezing.  Abdomen: Soft, NT, no masses, no organomegaly. Tender over the right upper quadrant.   Neuro: CN intact, no motor/sensory deficit.  Ext: Warm. No edema.  SKIN- intact  Recent Labs    09/17/17 2038  HGB 13.6  HCT 40.4  WBC 9.7  BUN 18  CREATININE 0.93  GLUCOSE 117*  CALCIUM 8.1*  BNP 1,492.6*    Discuss the benefits and adverse side affects of the medications use.  Discuss the benefits and adverse side affects of the required study.  Discuss the risk and  benefits of ambulation during hospitalization.   Baruch Merl, MD, PhD Cardiology

## 2017-09-18 NOTE — Progress Notes (Signed)
Cheryl Vargas is a 83 yo F with Afib on Xarelto, CHF EF 25%, HTN who presents with nausea, weakness, fatigue for several days.  In ER, appeared dehydrated.  No leukocytosis, but LFTs up, US abdomen showed distended acalculous gallbladder. Initially got IV fluids, later Lasix.    Possible Acalculous cholecystitis vs hepatic congestion HIDA scan normal.  No leuckocytosis, fever. -Consult General Surgery -Advance diet as tolerated -Stop Zosyn   Acute on chronic systolic CHF  HTN MR EF 45% at baseline.  BNP >1000 now.  States she only accumulates fluid in her abdomen. -Continue BB  -Hold losartan for now -Redose Lasix tonight and tomorrow given therapeutic response and great UOP -Strict I/Os, daily weights, daily Cr  Recent VT arrest, now s/p ICD 12/28  Renal cyst -US renal ordered  Other medications -Hold PPI

## 2017-09-18 NOTE — Progress Notes (Signed)
Pharmacy Antibiotic Note  Cheryl Vargas is a 83 y.o. female admitted on 09/17/2017 with intra-abdominal infection.  Pharmacy has been consulted for Zosyn dosing. WBC WNL. Lactic acid elevated. Renal function ok.   Plan: Zosyn 3.375G IV q8h to be infused over 4 hours Trend WBC, temp, renal function  F/U infectious work-up  Height: 5\' 5"  (165.1 cm) Weight: 146 lb (66.2 kg) IBW/kg (Calculated) : 57  Temp (24hrs), Avg:97.8 F (36.6 C), Min:97.8 F (36.6 C), Max:97.8 F (36.6 C)  Recent Labs  Lab 09/17/17 2038 09/18/17 0029 09/18/17 0207  WBC 9.7  --   --   CREATININE 0.93  --   --   LATICACIDVEN  --  2.20* 2.59*    Estimated Creatinine Clearance: 43.4 mL/min (by C-G formula based on SCr of 0.93 mg/dL).    Allergies  Allergen Reactions  . Sotalol Other (See Comments)    Prolonged QTc  . Alendronate Sodium Other (See Comments)    Arm pain  . Amiodarone Nausea Only  . Ciprofloxacin Other (See Comments)    Not effective  . Coreg [Carvedilol] Other (See Comments)    Fatigue   . Metoprolol Other (See Comments)    Profound fatigue     Narda Bonds 09/18/2017 2:28 AM

## 2017-09-18 NOTE — Progress Notes (Signed)
Pharmacy Antibiotic Note  Cheryl Vargas is a 83 y.o. female admitted on 09/17/2017 with intra-abdominal infection.  Pharmacy has been consulted for Zosyn dosing. WBC WNL. Lactic acid elevated. Renal function ok.   Plan: Continue Zosyn 3.375G IV q8h to be infused over 4 hours Trend WBC, temp, renal function  *Pharmacy will sign off as no further dose adjustments are anticipated. Thank you for the consult!  Height: 5\' 5"  (165.1 cm) Weight: 146 lb (66.2 kg) IBW/kg (Calculated) : 57  Temp (24hrs), Avg:97.8 F (36.6 C), Min:97.8 F (36.6 C), Max:97.8 F (36.6 C)  Recent Labs  Lab 09/17/17 2038 09/18/17 0029 09/18/17 0207 09/18/17 0418  WBC 9.7  --   --  9.5  CREATININE 0.93  --   --  0.96  LATICACIDVEN  --  2.20* 2.59*  --     Estimated Creatinine Clearance: 42.1 mL/min (by C-G formula based on SCr of 0.96 mg/dL).    Allergies  Allergen Reactions  . Sotalol Other (See Comments)    Prolonged QTc  . Alendronate Sodium Other (See Comments)    Arm pain  . Amiodarone Nausea Only  . Ciprofloxacin Other (See Comments)    Not effective  . Coreg [Carvedilol] Other (See Comments)    Fatigue   . Metoprolol Other (See Comments)    Profound fatigue     Arsenio Schnorr, Rande Lawman 09/18/2017 1:15 PM

## 2017-09-18 NOTE — ED Notes (Signed)
Blood cultures collected prior to starting antibiotics. 

## 2017-09-19 DIAGNOSIS — R1013 Epigastric pain: Secondary | ICD-10-CM

## 2017-09-19 DIAGNOSIS — I340 Nonrheumatic mitral (valve) insufficiency: Secondary | ICD-10-CM

## 2017-09-19 DIAGNOSIS — I480 Paroxysmal atrial fibrillation: Secondary | ICD-10-CM

## 2017-09-19 LAB — COMPREHENSIVE METABOLIC PANEL
ALT: 67 U/L — ABNORMAL HIGH (ref 14–54)
AST: 83 U/L — ABNORMAL HIGH (ref 15–41)
Albumin: 2.8 g/dL — ABNORMAL LOW (ref 3.5–5.0)
Alkaline Phosphatase: 90 U/L (ref 38–126)
Anion gap: 13 (ref 5–15)
BUN: 20 mg/dL (ref 6–20)
CO2: 25 mmol/L (ref 22–32)
Calcium: 8.3 mg/dL — ABNORMAL LOW (ref 8.9–10.3)
Chloride: 98 mmol/L — ABNORMAL LOW (ref 101–111)
Creatinine, Ser: 1.05 mg/dL — ABNORMAL HIGH (ref 0.44–1.00)
GFR calc Af Amer: 57 mL/min — ABNORMAL LOW (ref >60)
GFR calc non Af Amer: 49 mL/min — ABNORMAL LOW (ref >60)
Glucose, Bld: 88 mg/dL (ref 65–99)
Potassium: 3.2 mmol/L — ABNORMAL LOW (ref 3.5–5.1)
Sodium: 136 mmol/L (ref 135–145)
Total Bilirubin: 1.1 mg/dL (ref 0.3–1.2)
Total Protein: 5.7 g/dL — ABNORMAL LOW (ref 6.5–8.1)

## 2017-09-19 LAB — GLUCOSE, CAPILLARY
Glucose-Capillary: 112 mg/dL — ABNORMAL HIGH (ref 65–99)
Glucose-Capillary: 153 mg/dL — ABNORMAL HIGH (ref 65–99)
Glucose-Capillary: 97 mg/dL (ref 65–99)

## 2017-09-19 MED ORDER — POLYETHYLENE GLYCOL 3350 17 G PO PACK
17.0000 g | PACK | Freq: Every day | ORAL | Status: DC | PRN
  Administered 2017-09-19: 17 g via ORAL
  Filled 2017-09-19: qty 1

## 2017-09-19 MED ORDER — ENOXAPARIN SODIUM 40 MG/0.4ML ~~LOC~~ SOLN
40.0000 mg | SUBCUTANEOUS | Status: DC
  Administered 2017-09-19: 40 mg via SUBCUTANEOUS
  Filled 2017-09-19: qty 0.4

## 2017-09-19 MED ORDER — POTASSIUM CHLORIDE CRYS ER 20 MEQ PO TBCR
40.0000 meq | EXTENDED_RELEASE_TABLET | Freq: Once | ORAL | Status: AC
  Administered 2017-09-19: 40 meq via ORAL
  Filled 2017-09-19: qty 2

## 2017-09-19 NOTE — Progress Notes (Signed)
Progress Note  Patient Name: Cheryl Vargas Date of Encounter: 09/19/2017  Primary Cardiologist: [name], MD  CHF: Dr. Aundra Dubin  Subjective   Pt is feeling better. Breathing is at baseline and she feels that she is no longer holding fluid in her abdomen. No further nausea, is eating.   Inpatient Medications    Scheduled Meds: . bisoprolol  10 mg Oral Daily  . zolpidem  5 mg Oral QHS   Continuous Infusions:  PRN Meds: acetaminophen **OR** acetaminophen, ondansetron **OR** ondansetron (ZOFRAN) IV, polyethylene glycol   Vital Signs    Vitals:   09/18/17 2011 09/19/17 0000 09/19/17 0538 09/19/17 0838  BP: (!) 99/59  121/72 (!) 97/58  Pulse: 82  81   Resp: 18  18 17   Temp: 98.3 F (36.8 C)  98.1 F (36.7 C)   TempSrc: Oral  Oral   SpO2: 90% 93% 94% 93%  Weight:   142 lb 9.6 oz (64.7 kg)   Height:        Intake/Output Summary (Last 24 hours) at 09/19/2017 1028 Last data filed at 09/18/2017 1700 Gross per 24 hour  Intake -  Output 250 ml  Net -250 ml   Filed Weights   09/17/17 1839 09/19/17 0538  Weight: 146 lb (66.2 kg) 142 lb 9.6 oz (64.7 kg)    Telemetry    Afib in the 80's with frequent Vpacing - Personally Reviewed  ECG    No new tracings - Personally Reviewed  Physical Exam   GEN: No acute distress.   Neck: No JVD Cardiac: RRR, no murmurs, rubs, or gallops.  Respiratory: Clear to auscultation bilaterally. GI: Soft, nontender, non-distended  MS: No edema; No deformity. Neuro:  Nonfocal  Psych: Normal affect   Labs    Chemistry Recent Labs  Lab 09/17/17 2038 09/18/17 0418 09/19/17 0232  NA 138 138 136  K 3.9 3.7 3.2*  CL 109 102 98*  CO2 20* 21* 25  GLUCOSE 117* 107* 88  BUN 18 17 20   CREATININE 0.93 0.96 1.05*  CALCIUM 8.1* 8.6* 8.3*  PROT 5.9* 6.4* 5.7*  ALBUMIN 2.9* 3.1* 2.8*  AST 113* 107* 83*  ALT 62* 74* 67*  ALKPHOS 88 92 90  BILITOT 1.1 1.4* 1.1  GFRNONAA 57* 54* 49*  GFRAA >60 >60 57*  ANIONGAP 9 15 13       Hematology Recent Labs  Lab 09/17/17 2038 09/18/17 0418  WBC 9.7 9.5  RBC 4.36 4.35  HGB 13.6 13.3  HCT 40.4 39.9  MCV 92.7 91.7  MCH 31.2 30.6  MCHC 33.7 33.3  RDW 14.3 13.7  PLT 210 231    Cardiac EnzymesNo results for input(s): TROPONINI in the last 168 hours.  Recent Labs  Lab 09/17/17 2053  TROPIPOC 0.03     BNP Recent Labs  Lab 09/17/17 2038  BNP 1,492.6*     DDimer No results for input(s): DDIMER in the last 168 hours.   Radiology    Dg Chest 2 View  Result Date: 09/17/2017 CLINICAL DATA:  Sudden onset shortness of breath EXAM: CHEST  2 VIEW COMPARISON:  09/08/2017 FINDINGS: Biventricular pacer leads from the left in stable position. Small pleural effusions with probable fissural fluid seen in the lateral projection. No Kerley lines. No pneumothorax. No air bronchograms or asymmetric opacity to suggest pneumonia. IMPRESSION: Chronic cardiomegaly and small pleural effusions. Electronically Signed   By: Monte Fantasia M.D.   On: 09/17/2017 19:33   Nm Hepatobiliary Liver Func  Result Date:  09/18/2017 CLINICAL DATA:  83 year old female with abdominal pain, nausea and vomiting. Subsequent encounter. EXAM: NUCLEAR MEDICINE HEPATOBILIARY IMAGING TECHNIQUE: Sequential images of the abdomen were obtained out to 60 minutes following intravenous administration of radiopharmaceutical. RADIOPHARMACEUTICALS:  5.2 mCi Tc-16m  Choletec IV COMPARISON:  09/17/2017 FINDINGS: Prompt uptake and biliary excretion of activity by the liver is seen. Gallbladder activity is visualized, consistent with patency of cystic duct. Biliary activity passes into small bowel, consistent with patent common bile duct. Accumulation of radiotracer central aspect of the liver most likely related to stasis of radiotracer within common bile duct. Bile duct leak felt unlikely. IMPRESSION: Findings consistent with patent cystic duct and common bile duct. Electronically Signed   By: Genia Del M.D.   On:  09/18/2017 12:02   Ct Abdomen Pelvis W Contrast  Result Date: 09/18/2017 CLINICAL DATA:  Nausea and weakness. EXAM: CT ABDOMEN AND PELVIS WITH CONTRAST TECHNIQUE: Multidetector CT imaging of the abdomen and pelvis was performed using the standard protocol following bolus administration of intravenous contrast. CONTRAST:  163mL ISOVUE-300 IOPAMIDOL (ISOVUE-300) INJECTION 61% COMPARISON:  Right upper quadrant ultrasound from the same date. FINDINGS: Lower chest: Bilateral pleural effusions, right greater than left. Right lower lobe atelectasis. Single enlarged peri diaphragmatic lymph node measuring 13 mm in short axis. Hepatobiliary: Periportal edema. 1.2 cm hypoattenuated lesion in the right lobe of the liver measures water density and therefore likely represents a cyst. Indistinct edematous wall of the gallbladder with moderate amount of pericholecystic fluid. Pancreas: Unremarkable. No pancreatic ductal dilatation or surrounding inflammatory changes. Spleen: Normal in size without focal abnormality. Adrenals/Urinary Tract: Normal appearance of the adrenal glands. Bilateral cortical thinning. Large exophytic cyst off of the upper pole of the right kidney measuring 7.8 cm, which contains a calcified septum. Stomach/Bowel: Stomach is within normal limits. Appendix appears normal. No evidence of bowel wall thickening, distention, or inflammatory changes. Vascular/Lymphatic: Aortic atherosclerosis. No enlarged abdominal or pelvic lymph nodes. Reproductive: Female genitalia obscured by artifact from left hip arthroplasty. Other: No abdominal wall hernia or abnormality. No abdominopelvic ascites. Musculoskeletal: Multilevel osteoarthritic changes of the spine. IMPRESSION: Indistinctness of the wall of the gallbladder with marked pericholecystic fluid. Associated periportal edema, particularly prominent in the liver hilum. These findings may be secondary to acalculous cholecystitis versus primary cholangiocarcinoma or  hepatocellular carcinoma within the liver hilum. Alternatively, vascular injury to the liver and gallbladder, or liver congestion secondary to heart failure may produce similar appearance. The main portal vein and main hepatic artery are patent. Single enlarged para diaphragmatic lymph node. Large exophytic right renal cyst containing a calcified septum. Short-term follow-up with renal ultrasound may be considered. Bilateral pleural effusions, right greater than left. Electronically Signed   By: Fidela Salisbury M.D.   On: 09/18/2017 00:51   US Renal  Addendum Date: 09/18/2017   ADDENDUM REPORT: 09/18/2017 16:23 ADDENDUM: For follow-up of right upper pole 9.4 cm Bosniak 74F cyst, CT scan with and without contrast in 6-12 months recommended. Electronically Signed   By: Genia Del M.D.   On: 09/18/2017 16:23   Result Date: 09/18/2017 CLINICAL DATA:  83 year old female with renal cyst. Subsequent encounter. EXAM: RENAL / URINARY TRACT ULTRASOUND COMPLETE COMPARISON:  09/17/2017 CT. FINDINGS: Right Kidney: Length: 9.8 cm. 9.2 x 8.2 x 9.4 cm slightly complex cyst upper pole region with septation and small coarse calcifications. No hydronephrosis. Left Kidney: Length: 10.5 cm. Echogenicity within normal limits. No mass or hydronephrosis visualized. Bladder: Appears normal for degree of bladder distention. Bilateral pleural effusions.  IMPRESSION: Right upper pole 9.4 cm Bosniak 6F cyst. Follow-up in 6-12 months recommended for further delineation. Electronically Signed: By: Genia Del M.D. On: 09/18/2017 15:41   US Abdomen Limited Ruq  Result Date: 09/17/2017 CLINICAL DATA:  Elevated LFTs.  Abdominal pain. EXAM: ULTRASOUND ABDOMEN LIMITED RIGHT UPPER QUADRANT COMPARISON:  None. FINDINGS: Gallbladder: Gallbladder is distended. Diffuse gallbladder wall thickening measuring 6 mm with edematous appearing wall. No shadowing gallstone. Small amount of pericholecystic fluid. No sonographic Murphy sign noted by  sonographer. Common bile duct: Diameter: 3 mm, normal. Liver: No focal lesion identified. Heterogeneous and increased in parenchymal echogenicity. Portal vein is patent on color Doppler imaging with normal direction of blood flow towards the liver. Small amount perihepatic ascites. Incidental note of 7.4 cm cyst in the upper right kidney with possible internal septation. IMPRESSION: 1. Edematous gallbladder wall thickening measuring up to 6 mm. No gallstones. Small amount pericholecystic fluid and ascites. Findings may be secondary to acalculous cholecystitis. Wall thickening secondary to primary hepatic process such to hepatitis or systemic process such as heart failure could produce a similar appearance. 2. Mild hepatic heterogeneity and increased echogenicity suggesting steatosis. Electronically Signed   By: Jeb Levering M.D.   On: 09/17/2017 23:36    Cardiac Studies   Echo 09/04/17 Study Conclusions  - Left ventricle: The cavity size was mildly dilated. Wall thickness was normal. Systolic function was severely reduced. The estimated ejection fraction was in the range of 25% to 30%. Diffuse hypokinesis. - Ventricular septum: Septal motion showed abnormal function and dyssynergy. - Aortic valve: Trileaflet; mildly thickened, mildly calcified leaflets. There was mild regurgitation. - Mitral valve: There was moderate regurgitation. - Left atrium: The atrium was severely dilated. Volume/bsa, ES, (1-plane Simpson&'s, A2C): 66.3 ml/m^2. - Right atrium: The atrium was moderately dilated. - Tricuspid valve: There was moderate regurgitation. - Pulmonary arteries: Systolic pressure was mildly increased.  Impressions:  - EF is reduced when compared to prior study.  Patient Profile     83 y.o. female with perment atrial fibrillation, VT s/p recent BiV ICD 62/94/76, chronic systolic CHF presented for progressive weakness and fatigue with nausea vomiting and poor appetite. She  also has dyspnea. CXr with small pleural effusion. Elevated LFTs. by CT of the abdomen and pelvis which was showing features concerning for possible acalculous cholecystitis versus congestion from CHF versus hepatic malignancy. Breathing improved with IV lasix x 1. Device interrogation demonstrated normally functioning device  Assessment & Plan    1. Acute on chronic systolic CHF -EF 54-65% by echo 09/04/17. Home lasix 20 mg prn. -BiV ICD placed 09/07/17 -BNP 1492 on presentation. Diuresed with 3 doses of IV lasix 40 mg, last dose this am. Wt down 4 lbs.  -Pt feels like she is back to baseline  2. Abdominal pain with nausea -Abnormal abdominal CT, hepatobiliary liver function imaging showed patent cystic duct and common bile duct.  -Symptoms improved  3. Persistent atrial fibrillation/flutter -Rate controlled, on bisoprolol. Last dose of Xarelto was 09/17/17 pm. CHADSVASc score of 5.   4. Moderate MR - By echo 09/04/17. Followed by serial echo   For questions or updates, please contact [place] Please consult [email or web address] for contact info under Cardiology/STEMI.      Signed, Daune Perch, NP  09/19/2017, 10:28 AM    History and all data above reviewed.  Patient examined.  She feels better.   I agree with the findings as above.  The patient exam reveals COR:RRR  ,  Lungs: Clear  ,  Abd: Positive bowel sounds, no rebound no guarding, Ext No edema  .  All available labs, radiology testing, previous records reviewed. Agree with documented assessment and plan. Convert to PO Lasix and would make it 20 mg daily when she is discharged.  Please resume Xarelto at previous dose if there are no invasive procedures planned.  She was on Zaroxolyn previously but I do not think that she needs this at this point.  I will supplement the potassium.     Jeneen Rinks Anndrea Mihelich  12:05 PM  09/19/2017

## 2017-09-19 NOTE — Progress Notes (Signed)
PT Cancellation Note  Patient Details Name: Cheryl Vargas MRN: [id number] DOB: 1934-12-24   Cancelled Treatment:    Reason Eval/Treat Not Completed: Other (comment) attempted to perform skilled PT evaluation this morning, patient has just received lunch and requests PT come back. PT to return if time allows this afternoon.    Deniece Ree PT, DPT, CBIS  Supplemental Physical Therapist [facility]   Pager [phone]

## 2017-09-19 NOTE — Evaluation (Signed)
Physical Therapy Evaluation Patient Details Name: MEGAHN KILLINGS MRN: [id number] DOB: August 03, 1935 Today's Date: 09/19/2017   History of Present Illness  83 yo female with recent systolic and diastolic CHF with VT arrest who is s/p ICD placement and recent dx of A-fib, presented to ED with progressive weakness and fatigue, nausea, vomiting. Concern for hepatocellular carcinoma vs cholangiocarcinoma vs calculs cholecystitis vs CHF related congestion. PMH CHF, dysrhythmia, enlarged heart with MR, cardiomyopathy, A-fib, ICD insertion 09/07/17, hx cardiac cath and cardioversion, anterior THR L 2014   Clinical Impression   Patient received in bed, pleasant and willing to participate in PT but fatigued. She is able to complete all bed mobility and functional transfers with min guard in general however is relatively unsteady when ambulating without assistive device, requiring close min guard and occasional Min assist to maintain balance while ambulating. Note patient quite fatigued after ambulating only 78f at self-selected pace in room, also note pair and multiform PVCs with short gait period. Patient may benefit from skilled rehabilitation in the SNF setting moving forward given gross weakness, deconditioning, and unsteadiness/difficulty with functional mobility. She declines up to chair this afternoon and was left in bed with all needs met and questions/concerns addressed.     Follow Up Recommendations SNF    Equipment Recommendations  None recommended by PT    Recommendations for Other Services       Precautions / Restrictions Precautions Precautions: ICD/Pacemaker Restrictions Weight Bearing Restrictions: Yes LUE Weight Bearing: Non weight bearing Other Position/Activity Restrictions: ok to use BUE for RW       Mobility  Bed Mobility Overal bed mobility: Needs Assistance Bed Mobility: Supine to Sit;Sit to Supine   Sidelying to sit: Min guard Supine to sit: Min guard;HOB elevated     General bed mobility comments: increased time noted   Transfers Overall transfer level: Needs assistance Equipment used: None Transfers: Sit to/from Stand Sit to Stand: Min guard         General transfer comment: VC for safety and hand placement, min guard for safety   Ambulation/Gait Ambulation/Gait assistance: Min guard;Min assist Ambulation Distance (Feet): 40 Feet(in room ) Assistive device: None Gait Pattern/deviations: Step-through pattern;Decreased stride length;Drifts right/left;Narrow base of support Gait velocity: decreased   General Gait Details: gait limited by fatigue today, note intermittent pair and multiform PVCs with short gait distance in room which seemed to resolve with rest in supine following gait ; occasional Min assist for balance   Stairs            Wheelchair Mobility    Modified Rankin (Stroke Patients Only)       Balance Overall balance assessment: Needs assistance Sitting-balance support: Bilateral upper extremity supported;Feet supported Sitting balance-Leahy Scale: Good     Standing balance support: During functional activity Standing balance-Leahy Scale: Fair Standing balance comment: occasional Min assist for balance when ambulating with no device                              Pertinent Vitals/Pain Pain Assessment: No/denies pain Pain Intervention(s): Monitored during session;Limited activity within patient's tolerance    Home Living Family/patient expects to be discharged to:: Private residence Living Arrangements: Alone Available Help at Discharge: Family;Available 24 hours/day Type of Home: (condo ) Home Access: Level entry     Home Layout: One level Home Equipment: Walker - 2 wheels;Walker - 4 wheels;Bedside commode;Transport chair      Prior Function Level of  Independence: Independent         Comments: Active. Drives. Does her own housekeeping and grocery shopping.     Hand Dominance    Dominant Hand: Right    Extremity/Trunk Assessment   Upper Extremity Assessment Upper Extremity Assessment: Defer to OT evaluation LUE Deficits / Details: limited ROM post-op    Lower Extremity Assessment Lower Extremity Assessment: Generalized weakness    Cervical / Trunk Assessment Cervical / Trunk Assessment: Normal  Communication   Communication: No difficulties  Cognition Arousal/Alertness: Awake/alert Behavior During Therapy: WFL for tasks assessed/performed Overall Cognitive Status: Within Functional Limits for tasks assessed                                        General Comments      Exercises     Assessment/Plan    PT Assessment Patient needs continued PT services  PT Problem List Decreased strength;Decreased mobility;Decreased knowledge of precautions;Decreased activity tolerance;Cardiopulmonary status limiting activity;Decreased balance;Decreased knowledge of use of DME;Pain       PT Treatment Interventions DME instruction;Therapeutic activities;Gait training;Therapeutic exercise;Patient/family education;Stair training;Balance training;Functional mobility training;Neuromuscular re-education    PT Goals (Current goals can be found in the Care Plan section)  Acute Rehab PT Goals Patient Stated Goal: to get better  PT Goal Formulation: With patient Time For Goal Achievement: 09/26/17 Potential to Achieve Goals: Good    Frequency Min 3X/week   Barriers to discharge        Co-evaluation               AM-PAC PT "6 Clicks" Daily Activity  Outcome Measure Difficulty turning over in bed (including adjusting bedclothes, sheets and blankets)?: Unable Difficulty moving from lying on back to sitting on the side of the bed? : Unable Difficulty sitting down on and standing up from a chair with arms (e.g., wheelchair, bedside commode, etc,.)?: Unable Help needed moving to and from a bed to chair (including a wheelchair)?: A Little Help  needed walking in hospital room?: A Little Help needed climbing 3-5 steps with a railing? : A Lot 6 Click Score: 11    End of Session Equipment Utilized During Treatment: Gait belt Activity Tolerance: Patient limited by fatigue Patient left: in bed;with call bell/phone within reach   PT Visit Diagnosis: Unsteadiness on feet (R26.81);Muscle weakness (generalized) (M62.81);Other abnormalities of gait and mobility (R26.89)    Time: 7471-5953 PT Time Calculation (min) (ACUTE ONLY): 16 min   Charges:   PT Evaluation $PT Eval Moderate Complexity: 1 Mod     PT G Codes:   PT G-Codes **NOT FOR INPATIENT CLASS** Functional Assessment Tool Used: AM-PAC 6 Clicks Basic Mobility;Clinical judgement    Deniece Ree PT, DPT, CBIS  Supplemental Physical Therapist [place]   Pager [phone]

## 2017-09-19 NOTE — Progress Notes (Signed)
PROGRESS NOTE    Cheryl Vargas  VHQ:[id number] DOB: 1935/03/21 DOA: 09/17/2017 PCP: Lavone Orn, MD   Brief Narrative: Cheryl Vargas is a 83 y.o.  female with history of recently diagnosed combined systolic and diastolic CHF with VT arrest status post ICD placement and recently diagnosed atrial fibrillation. She presented with abdominal pain, nausea and vomiting and found to have acute heart failure.   Assessment & Plan:   Principal Problem:   Abdominal pain Active Problems:   Paroxysmal atrial fibrillation (HCC)   Acute on chronic combined systolic (congestive) and diastolic (congestive) heart failure (HCC)   Severe mitral valve regurgitation   CHF (congestive heart failure) (HCC)   Abdominal pain Nausea and vomiting Resolved. Likely secondary to hepatic congestion in setting of heart failure. CT scan abnormal with normal HIDA scan.  Acute on chronic combined systolic and diastolic heart failure Patient is s/p ICD placement after V-tach arrest. Diuresed well. Last EF of 25-30% with diffuse hypokinesis on 12/25. -Cardiology recommendations: hold diuresis  Renal cyst Outpatient follow-up  Mitral regurgitation Outpatient cardiology follow-up.  Abnormal CT scan HIDA scan normal. Will discuss with general surgery for recommendations.   DVT prophylaxis: Lovenox Code Status: Full code Family Communication: None at bedside Disposition Plan: Discharge to SNF   Consultants:   Cardiology  General surgery  Procedures:   None  Antimicrobials:  Zosyn (1/7>>1/8)    Subjective: Mild dyspnea.  Objective: Vitals:   09/18/17 2011 09/19/17 0000 09/19/17 0538 09/19/17 0838  BP: (!) 99/59  121/72 (!) 97/58  Pulse: 82  81   Resp: 18  18 17   Temp: 98.3 F (36.8 C)  98.1 F (36.7 C)   TempSrc: Oral  Oral   SpO2: 90% 93% 94% 93%  Weight:   64.7 kg (142 lb 9.6 oz)   Height:        Intake/Output Summary (Last 24 hours) at 09/19/2017 0931 Last data filed at  09/18/2017 1700 Gross per 24 hour  Intake -  Output 250 ml  Net -250 ml   Filed Weights   09/17/17 1839 09/19/17 0538  Weight: 66.2 kg (146 lb) 64.7 kg (142 lb 9.6 oz)    Examination:  General exam: Appears calm and comfortable Respiratory system: Clear to auscultation. Respiratory effort normal. Cardiovascular system: S1 & S2 heard, RRR. No murmurs, rubs, gallops or clicks. Gastrointestinal system: Abdomen is nondistended, soft and nontender. No organomegaly or masses felt. Normal bowel sounds heard. Central nervous system: Alert and oriented. No focal neurological deficits. Extremities: No edema. No calf tenderness Skin: No cyanosis. No rashes Psychiatry: Judgement and insight appear normal. Mood & affect appropriate.     Data Reviewed: I have personally reviewed following labs and imaging studies  CBC: Recent Labs  Lab 09/17/17 2038 09/18/17 0418  WBC 9.7 9.5  NEUTROABS 8.1*  --   HGB 13.6 13.3  HCT 40.4 39.9  MCV 92.7 91.7  PLT 210 413   Basic Metabolic Panel: Recent Labs  Lab 09/17/17 2038 09/18/17 0418 09/19/17 0232  NA 138 138 136  K 3.9 3.7 3.2*  CL 109 102 98*  CO2 20* 21* 25  GLUCOSE 117* 107* 88  BUN 18 17 20   CREATININE 0.93 0.96 1.05*  CALCIUM 8.1* 8.6* 8.3*  MG  --  1.9  --    GFR: Estimated Creatinine Clearance: 38.5 mL/min (A) (by C-G formula based on SCr of 1.05 mg/dL (H)). Liver Function Tests: Recent Labs  Lab 09/17/17 2038 09/18/17 0418 09/19/17 0232  AST 113* 107* 83*  ALT 62* 74* 67*  ALKPHOS 88 92 90  BILITOT 1.1 1.4* 1.1  PROT 5.9* 6.4* 5.7*  ALBUMIN 2.9* 3.1* 2.8*   Recent Labs  Lab 09/17/17 2038  LIPASE 27   No results for input(s): AMMONIA in the last 168 hours. Coagulation Profile: No results for input(s): INR, PROTIME in the last 168 hours. Cardiac Enzymes: No results for input(s): CKTOTAL, CKMB, CKMBINDEX, TROPONINI in the last 168 hours. BNP (last 3 results) No results for input(s): PROBNP in the last 8760  hours. HbA1C: No results for input(s): HGBA1C in the last 72 hours. CBG: Recent Labs  Lab 09/18/17 0755 09/18/17 1607 09/19/17 0012 09/19/17 0824  GLUCAP 103* 129* 97 153*   Lipid Profile: No results for input(s): CHOL, HDL, LDLCALC, TRIG, CHOLHDL, LDLDIRECT in the last 72 hours. Thyroid Function Tests: No results for input(s): TSH, T4TOTAL, FREET4, T3FREE, THYROIDAB in the last 72 hours. Anemia Panel: No results for input(s): VITAMINB12, FOLATE, FERRITIN, TIBC, IRON, RETICCTPCT in the last 72 hours. Sepsis Labs: Recent Labs  Lab 09/18/17 0029 09/18/17 0207  LATICACIDVEN 2.20* 2.59*    No results found for this or any previous visit (from the past 240 hour(s)).       Radiology Studies: Dg Chest 2 View  Result Date: 09/17/2017 CLINICAL DATA:  Sudden onset shortness of breath EXAM: CHEST  2 VIEW COMPARISON:  09/08/2017 FINDINGS: Biventricular pacer leads from the left in stable position. Small pleural effusions with probable fissural fluid seen in the lateral projection. No Kerley lines. No pneumothorax. No air bronchograms or asymmetric opacity to suggest pneumonia. IMPRESSION: Chronic cardiomegaly and small pleural effusions. Electronically Signed   By: Monte Fantasia M.D.   On: 09/17/2017 19:33   Nm Hepatobiliary Liver Func  Result Date: 09/18/2017 CLINICAL DATA:  83 year old female with abdominal pain, nausea and vomiting. Subsequent encounter. EXAM: NUCLEAR MEDICINE HEPATOBILIARY IMAGING TECHNIQUE: Sequential images of the abdomen were obtained out to 60 minutes following intravenous administration of radiopharmaceutical. RADIOPHARMACEUTICALS:  5.2 mCi Tc-59m  Choletec IV COMPARISON:  09/17/2017 FINDINGS: Prompt uptake and biliary excretion of activity by the liver is seen. Gallbladder activity is visualized, consistent with patency of cystic duct. Biliary activity passes into small bowel, consistent with patent common bile duct. Accumulation of radiotracer central aspect of  the liver most likely related to stasis of radiotracer within common bile duct. Bile duct leak felt unlikely. IMPRESSION: Findings consistent with patent cystic duct and common bile duct. Electronically Signed   By: Genia Del M.D.   On: 09/18/2017 12:02   Ct Abdomen Pelvis W Contrast  Result Date: 09/18/2017 CLINICAL DATA:  Nausea and weakness. EXAM: CT ABDOMEN AND PELVIS WITH CONTRAST TECHNIQUE: Multidetector CT imaging of the abdomen and pelvis was performed using the standard protocol following bolus administration of intravenous contrast. CONTRAST:  156mL ISOVUE-300 IOPAMIDOL (ISOVUE-300) INJECTION 61% COMPARISON:  Right upper quadrant ultrasound from the same date. FINDINGS: Lower chest: Bilateral pleural effusions, right greater than left. Right lower lobe atelectasis. Single enlarged peri diaphragmatic lymph node measuring 13 mm in short axis. Hepatobiliary: Periportal edema. 1.2 cm hypoattenuated lesion in the right lobe of the liver measures water density and therefore likely represents a cyst. Indistinct edematous wall of the gallbladder with moderate amount of pericholecystic fluid. Pancreas: Unremarkable. No pancreatic ductal dilatation or surrounding inflammatory changes. Spleen: Normal in size without focal abnormality. Adrenals/Urinary Tract: Normal appearance of the adrenal glands. Bilateral cortical thinning. Large exophytic cyst off of the upper pole of  the right kidney measuring 7.8 cm, which contains a calcified septum. Stomach/Bowel: Stomach is within normal limits. Appendix appears normal. No evidence of bowel wall thickening, distention, or inflammatory changes. Vascular/Lymphatic: Aortic atherosclerosis. No enlarged abdominal or pelvic lymph nodes. Reproductive: Female genitalia obscured by artifact from left hip arthroplasty. Other: No abdominal wall hernia or abnormality. No abdominopelvic ascites. Musculoskeletal: Multilevel osteoarthritic changes of the spine. IMPRESSION:  Indistinctness of the wall of the gallbladder with marked pericholecystic fluid. Associated periportal edema, particularly prominent in the liver hilum. These findings may be secondary to acalculous cholecystitis versus primary cholangiocarcinoma or hepatocellular carcinoma within the liver hilum. Alternatively, vascular injury to the liver and gallbladder, or liver congestion secondary to heart failure may produce similar appearance. The main portal vein and main hepatic artery are patent. Single enlarged para diaphragmatic lymph node. Large exophytic right renal cyst containing a calcified septum. Short-term follow-up with renal ultrasound may be considered. Bilateral pleural effusions, right greater than left. Electronically Signed   By: Fidela Salisbury M.D.   On: 09/18/2017 00:51   US Renal  Addendum Date: 09/18/2017   ADDENDUM REPORT: 09/18/2017 16:23 ADDENDUM: For follow-up of right upper pole 9.4 cm Bosniak 226F cyst, CT scan with and without contrast in 6-12 months recommended. Electronically Signed   By: Genia Del M.D.   On: 09/18/2017 16:23   Result Date: 09/18/2017 CLINICAL DATA:  84 year old female with renal cyst. Subsequent encounter. EXAM: RENAL / URINARY TRACT ULTRASOUND COMPLETE COMPARISON:  09/17/2017 CT. FINDINGS: Right Kidney: Length: 9.8 cm. 9.2 x 8.2 x 9.4 cm slightly complex cyst upper pole region with septation and small coarse calcifications. No hydronephrosis. Left Kidney: Length: 10.5 cm. Echogenicity within normal limits. No mass or hydronephrosis visualized. Bladder: Appears normal for degree of bladder distention. Bilateral pleural effusions. IMPRESSION: Right upper pole 9.4 cm Bosniak 226F cyst. Follow-up in 6-12 months recommended for further delineation. Electronically Signed: By: Genia Del M.D. On: 09/18/2017 15:41   US Abdomen Limited Ruq  Result Date: 09/17/2017 CLINICAL DATA:  Elevated LFTs.  Abdominal pain. EXAM: ULTRASOUND ABDOMEN LIMITED RIGHT UPPER QUADRANT  COMPARISON:  None. FINDINGS: Gallbladder: Gallbladder is distended. Diffuse gallbladder wall thickening measuring 6 mm with edematous appearing wall. No shadowing gallstone. Small amount of pericholecystic fluid. No sonographic Murphy sign noted by sonographer. Common bile duct: Diameter: 3 mm, normal. Liver: No focal lesion identified. Heterogeneous and increased in parenchymal echogenicity. Portal vein is patent on color Doppler imaging with normal direction of blood flow towards the liver. Small amount perihepatic ascites. Incidental note of 7.4 cm cyst in the upper right kidney with possible internal septation. IMPRESSION: 1. Edematous gallbladder wall thickening measuring up to 6 mm. No gallstones. Small amount pericholecystic fluid and ascites. Findings may be secondary to acalculous cholecystitis. Wall thickening secondary to primary hepatic process such to hepatitis or systemic process such as heart failure could produce a similar appearance. 2. Mild hepatic heterogeneity and increased echogenicity suggesting steatosis. Electronically Signed   By: Jeb Levering M.D.   On: 09/17/2017 23:36        Scheduled Meds: . bisoprolol  10 mg Oral Daily  . zolpidem  5 mg Oral QHS   Continuous Infusions:   LOS: 2 days     Cordelia Poche, MD [facility] 09/19/2017, 9:31 AM Pager: [phone]  If 7PM-7AM, please contact night-coverage [email or web address] Password [email or web address] 09/19/2017, 9:31 AM

## 2017-09-20 ENCOUNTER — Ambulatory Visit: Payer: PPO

## 2017-09-20 DIAGNOSIS — J309 Allergic rhinitis, unspecified: Secondary | ICD-10-CM | POA: Diagnosis not present

## 2017-09-20 DIAGNOSIS — I4891 Unspecified atrial fibrillation: Secondary | ICD-10-CM | POA: Diagnosis not present

## 2017-09-20 DIAGNOSIS — R109 Unspecified abdominal pain: Secondary | ICD-10-CM | POA: Diagnosis not present

## 2017-09-20 DIAGNOSIS — K819 Cholecystitis, unspecified: Secondary | ICD-10-CM | POA: Diagnosis not present

## 2017-09-20 DIAGNOSIS — I340 Nonrheumatic mitral (valve) insufficiency: Secondary | ICD-10-CM | POA: Diagnosis not present

## 2017-09-20 DIAGNOSIS — R262 Difficulty in walking, not elsewhere classified: Secondary | ICD-10-CM | POA: Diagnosis not present

## 2017-09-20 DIAGNOSIS — R945 Abnormal results of liver function studies: Secondary | ICD-10-CM | POA: Diagnosis not present

## 2017-09-20 DIAGNOSIS — R1013 Epigastric pain: Secondary | ICD-10-CM | POA: Diagnosis not present

## 2017-09-20 DIAGNOSIS — E785 Hyperlipidemia, unspecified: Secondary | ICD-10-CM | POA: Diagnosis not present

## 2017-09-20 DIAGNOSIS — G4700 Insomnia, unspecified: Secondary | ICD-10-CM | POA: Diagnosis not present

## 2017-09-20 DIAGNOSIS — I10 Essential (primary) hypertension: Secondary | ICD-10-CM | POA: Diagnosis not present

## 2017-09-20 DIAGNOSIS — I480 Paroxysmal atrial fibrillation: Secondary | ICD-10-CM | POA: Diagnosis not present

## 2017-09-20 DIAGNOSIS — R52 Pain, unspecified: Secondary | ICD-10-CM | POA: Diagnosis not present

## 2017-09-20 DIAGNOSIS — R279 Unspecified lack of coordination: Secondary | ICD-10-CM | POA: Diagnosis not present

## 2017-09-20 DIAGNOSIS — K5900 Constipation, unspecified: Secondary | ICD-10-CM | POA: Diagnosis not present

## 2017-09-20 DIAGNOSIS — M6281 Muscle weakness (generalized): Secondary | ICD-10-CM | POA: Diagnosis not present

## 2017-09-20 DIAGNOSIS — I5023 Acute on chronic systolic (congestive) heart failure: Secondary | ICD-10-CM | POA: Diagnosis not present

## 2017-09-20 DIAGNOSIS — N281 Cyst of kidney, acquired: Secondary | ICD-10-CM | POA: Diagnosis not present

## 2017-09-20 DIAGNOSIS — I5043 Acute on chronic combined systolic (congestive) and diastolic (congestive) heart failure: Secondary | ICD-10-CM | POA: Diagnosis not present

## 2017-09-20 DIAGNOSIS — H04129 Dry eye syndrome of unspecified lacrimal gland: Secondary | ICD-10-CM | POA: Diagnosis not present

## 2017-09-20 DIAGNOSIS — Z111 Encounter for screening for respiratory tuberculosis: Secondary | ICD-10-CM | POA: Diagnosis not present

## 2017-09-20 LAB — GLUCOSE, CAPILLARY: Glucose-Capillary: 110 mg/dL — ABNORMAL HIGH (ref 65–99)

## 2017-09-20 LAB — COMPREHENSIVE METABOLIC PANEL
ALT: 47 U/L (ref 14–54)
AST: 47 U/L — ABNORMAL HIGH (ref 15–41)
Albumin: 2.6 g/dL — ABNORMAL LOW (ref 3.5–5.0)
Alkaline Phosphatase: 87 U/L (ref 38–126)
Anion gap: 9 (ref 5–15)
BUN: 21 mg/dL — ABNORMAL HIGH (ref 6–20)
CO2: 27 mmol/L (ref 22–32)
Calcium: 8.2 mg/dL — ABNORMAL LOW (ref 8.9–10.3)
Chloride: 102 mmol/L (ref 101–111)
Creatinine, Ser: 0.88 mg/dL (ref 0.44–1.00)
GFR calc Af Amer: >60 mL/min (ref >60)
GFR calc non Af Amer: >60 mL/min (ref >60)
Glucose, Bld: 116 mg/dL — ABNORMAL HIGH (ref 65–99)
Potassium: 3 mmol/L — ABNORMAL LOW (ref 3.5–5.1)
Sodium: 138 mmol/L (ref 135–145)
Total Bilirubin: 0.9 mg/dL (ref 0.3–1.2)
Total Protein: 5.7 g/dL — ABNORMAL LOW (ref 6.5–8.1)

## 2017-09-20 MED ORDER — TRAZODONE HCL 50 MG PO TABS
50.0000 mg | ORAL_TABLET | Freq: Once | ORAL | Status: AC
  Administered 2017-09-20: 50 mg via ORAL
  Filled 2017-09-20: qty 1

## 2017-09-20 MED ORDER — POLYETHYLENE GLYCOL 3350 17 G PO PACK: 17.0000 g | PACK | Freq: Every day | ORAL | Status: DC | PRN

## 2017-09-20 MED ORDER — POTASSIUM CHLORIDE ER 20 MEQ PO TBCR: 20.0000 meq | EXTENDED_RELEASE_TABLET | Freq: Every day | ORAL | Status: DC

## 2017-09-20 MED ORDER — FUROSEMIDE 20 MG PO TABS: 20.0000 mg | ORAL_TABLET | Freq: Every day | ORAL | Status: DC

## 2017-09-20 MED ORDER — POTASSIUM CHLORIDE CRYS ER 20 MEQ PO TBCR
40.0000 meq | EXTENDED_RELEASE_TABLET | ORAL | Status: AC
  Administered 2017-09-20 (×2): 40 meq via ORAL
  Filled 2017-09-20 (×2): qty 2

## 2017-09-20 MED ORDER — RIVAROXABAN 15 MG PO TABS
15.0000 mg | ORAL_TABLET | Freq: Every day | ORAL | Status: DC
  Administered 2017-09-20: 15 mg via ORAL
  Filled 2017-09-20: qty 1

## 2017-09-20 MED ORDER — ZOLPIDEM TARTRATE 5 MG PO TABS: 5.0000 mg | ORAL_TABLET | Freq: Every day | ORAL | 0 refills | Status: DC

## 2017-09-20 NOTE — Progress Notes (Signed)
Occupational Therapy Evaluation Patient Details Name: Cheryl Vargas MRN: [id number] DOB: April 20, 1935 Today's Date: 09/20/2017    History of Present Illness 83 yo female with recent systolic and diastolic CHF with VT arrest who is s/p ICD placement and recent dx of A-fib, presented to ED with progressive weakness and fatigue, nausea, vomiting. Concern for hepatocellular carcinoma vs cholangiocarcinoma vs calculs cholecystitis vs CHF related congestion. PMH CHF, dysrhythmia, enlarged heart with MR, cardiomyopathy, A-fib, ICD insertion 09/07/17, hx cardiac cath and cardioversion, anterior THR L 2014    Clinical Impression   Prior to hospitalization in December, pt was very active and was independent with ADL and mobility. Pt currently requires min A for mobility and ADL and demonstrates poor activity tolerance. Pt very motivated to return to PLOF and prefers to go to rehab at [facility]. Will follow acutely to maximize functional level of independence to facilitate safe DC to next venue of care.     Follow Up Recommendations  Supervision/Assistance - 24 hour;SNF(initially)    Equipment Recommendations  None recommended by OT    Recommendations for Other Services       Precautions / Restrictions Precautions Precautions: ICD/Pacemaker Restrictions Weight Bearing Restrictions: Yes Other Position/Activity Restrictions: ok to use BUE for RW       Mobility Bed Mobility Overal bed mobility: Modified Independent                Transfers Overall transfer level: Needs assistance Equipment used: 1 person hand held assist Transfers: Sit to/from Stand Sit to Stand: Min guard         General transfer comment: min A to steady pt. " I just feel off balance"    Balance Overall balance assessment: Needs assistance Sitting-balance support: Bilateral upper extremity supported;Feet supported Sitting balance-Leahy Scale: Good     Standing balance support: During functional  activity Standing balance-Leahy Scale: Fair Standing balance comment: occasional Min assist for balance when ambulating with no device                            ADL either performed or assessed with clinical judgement   ADL Overall ADL's : Needs assistance/impaired Eating/Feeding: Modified independent;Sitting   Grooming: Wash/dry hands;Wash/dry face;Oral care;Standing;Min guard Grooming Details (indicate cue type and reason): sink level Upper Body Bathing: Set up;Sitting   Lower Body Bathing: Sitting/lateral leans;Minimal assistance   Upper Body Dressing : Minimal assistance;Sitting   Lower Body Dressing: Sit to/from stand;Minimal assistance Lower Body Dressing Details (indicate cue type and reason): able to cross legs to don/doff socks Toilet Transfer: Ambulation;Minimal assistance   Toileting- Clothing Manipulation and Hygiene: Set up       Functional mobility during ADLs: Minimal assistance(vc for safe hand placement) General ADL Comments: Pt easily fatigues; Requests to sit after ambulating around bed to sink, @ 10 ft     Vision Baseline Vision/History: Wears glasses       Perception     Praxis      Pertinent Vitals/Pain Pain Assessment: 0-10 Faces Pain Scale: Hurts little more Pain Location: L shoulder/chest Pain Descriptors / Indicators: Tightness;Sore Pain Intervention(s): Limited activity within patient's tolerance     Hand Dominance Right   Extremity/Trunk Assessment Upper Extremity Assessment Upper Extremity Assessment: Generalized weakness LUE Deficits / Details: limited ROM post-op(limited eaching behind back)       Cervical / Trunk Assessment Cervical / Trunk Assessment: Normal   Communication Communication Communication: No difficulties   Cognition Arousal/Alertness:  Awake/alert Behavior During Therapy: WFL for tasks assessed/performed Overall Cognitive Status: Within Functional Limits for tasks assessed                                      General Comments       Exercises     Shoulder Instructions      Home Living Family/patient expects to be discharged to:: Skilled nursing facility Living Arrangements: Alone Available Help at Discharge: Family;Available 24 hours/day Type of Home: (condo ) Home Access: Level entry     Home Layout: One level     Bathroom Shower/Tub: Occupational psychologist: Standard Bathroom Accessibility: Yes   Home Equipment: Environmental consultant - 2 wheels;Walker - 4 wheels;Bedside commode;Transport chair          Prior Functioning/Environment Level of Independence: Independent        Comments: Active. Drives. Does her own housekeeping and grocery shopping.        OT Problem List: Decreased activity tolerance;Decreased range of motion;Impaired balance (sitting and/or standing);Decreased knowledge of use of DME or AE;Decreased knowledge of precautions;Pain;Cardiopulmonary status limiting activity      OT Treatment/Interventions: Self-care/ADL training;Therapeutic exercise;Energy conservation;DME and/or AE instruction;Therapeutic activities;Patient/family education;Balance training    OT Goals(Current goals can be found in the care plan section) Acute Rehab OT Goals Patient Stated Goal: to be independent OT Goal Formulation: With patient Time For Goal Achievement: 10/04/17 Potential to Achieve Goals: Good  OT Frequency: Min 2X/week   Barriers to D/C:            Co-evaluation              AM-PAC PT "6 Clicks" Daily Activity     Outcome Measure Help from another person eating meals?: None Help from another person taking care of personal grooming?: A Little Help from another person toileting, which includes using toliet, bedpan, or urinal?: A Little Help from another person bathing (including washing, rinsing, drying)?: A Little Help from another person to put on and taking off regular upper body clothing?: A Little Help from another person to  put on and taking off regular lower body clothing?: A Little 6 Click Score: 19   End of Session Equipment Utilized During Treatment: Gait belt Nurse Communication: Mobility status;Other (comment)(pt's concern regarding lasix)  Activity Tolerance: Patient tolerated treatment well Patient left: in bed;with call bell/phone within reach  OT Visit Diagnosis: Unsteadiness on feet (R26.81);Muscle weakness (generalized) (M62.81);Pain Pain - part of body: (chest)                Time: 1400-1415 OT Time Calculation (min): 15 min Charges:  OT General Charges $OT Visit: 1 Visit OT Evaluation $OT Eval Moderate Complexity: 1 Mod G-Codes:     Mozes Sagar, OT/L  [phone] 09/20/2017  Esequiel Kleinfelter,HILLARY 09/20/2017, 2:43 PM

## 2017-09-20 NOTE — Discharge Summary (Signed)
Physician Discharge Summary  Cheryl Vargas ZDG:[id number] DOB: [birth year] DOA: 09/17/2017  PCP: Lavone Orn, MD  Admit date: 09/17/2017 Discharge date: 09/20/2017  Admitted From: Home Disposition: SNF  Recommendations for Outpatient Follow-up:  1. Follow up with PCP in 1 week 2. Please obtain BMP/CBC in one week 3. Please follow up on the following pending results: None  Home Health: SNF Equipment/Devices: SNF  Discharge Condition: Stable CODE STATUS: Full code Diet recommendation: Heart healthy   Brief/Interim Summary:  Admission HPI written by Rise Patience, MD   Chief Complaint: Dizziness and nausea vomiting.  HPI: Cheryl Vargas is a 83 y.o. 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  Patient states since discharge last week patient has been feeling fatigued but last 24 hours has become more worse with nausea vomiting and upper abdominal pain like a band.  Has been having some diarrhea.  Denies any productive cough fever or chills.  ED Course: In the ER chest x-ray shows cardiomegaly with small pleural effusion.  BNP was elevated.  LFTs were mildly elevated.  On exam patient has epigastric tenderness.  Sonogram of the abdomen was done which was concerning for acalculous cholecystitis.  This was followed by CT of the abdomen and pelvis which was showing features concerning for possible acalculous cholecystitis versus congestion from CHF versus hepatic malignancy.  There also was a exophytic lesion in the right kidney.  Cardiology was consulted.  Patient was given Lasix 40 mg in the ER.  On my exam patient is not in distress.  Does have epigastric tenderness.  On-call general surgeon Dr. Redmond Pulling was consulted and requested HIDA scan.    Hospital  course:  Abdominal pain Nausea and vomiting Likely secondary to hepatic congestion in setting of heart failure. CT scan abnormal, concerning for acalculous cholecystitis with normal HIDA scan. Resolved with resolution of fluid overload.  Acute on chronic combined systolic and diastolic heart failure Patient is s/p ICD placement after V-tach arrest. Diuresed well IV lasix. Last EF of 25-30% with diffuse hypokinesis on 12/25. Cardiology recommending continued lasix and starting potassium supplementation on discharge.  Renal cyst Patient has a history of this in the past. Unsure of what class. Currently Bosniak 48F cyst. Outpatient follow-up with repeat imaging recommended in 6-12 months  Mitral regurgitation Outpatient cardiology follow-up.  Abnormal CT scan HIDA scan normal. Discussed with radiology and likely not cholangiocarcinoma or hepatocellular carcinoma but rather significant edema.    Discharge Diagnoses:  Principal Problem:   Abdominal pain Active Problems:   Paroxysmal atrial fibrillation (HCC)   Acute on chronic combined systolic (congestive) and diastolic (congestive) heart failure (HCC)   Severe mitral valve regurgitation   CHF (congestive heart failure) [facility])    Discharge Instructions  Discharge Instructions    Diet - low sodium heart healthy   Complete by:  As directed    Increase activity slowly   Complete by:  As directed      Allergies as of 09/20/2017      Reactions   Sotalol Other (See Comments)   Prolonged QTc   Alendronate Sodium Other (See Comments)   Arm pain   Amiodarone Nausea Only   Ciprofloxacin Other (See Comments)   Not effective   Coreg [carvedilol] Other (See Comments)   Fatigue   Metoprolol Other (See Comments)  Profound fatigue      Medication List    TAKE these medications   acetaminophen 500 MG tablet Commonly known as:  TYLENOL Take 500 mg by mouth every 6 (six) hours as needed for moderate pain.   bisoprolol 10 MG  tablet Commonly known as:  ZEBETA Take 1 tablet (10 mg total) by mouth daily.   calcium carbonate 500 MG chewable tablet Commonly known as:  TUMS - dosed in mg elemental calcium Chew 1-2 tablets (200-400 mg of elemental calcium total) by mouth as needed for indigestion or heartburn.   docusate sodium 100 MG capsule Commonly known as:  COLACE Take 100 mg by mouth daily as needed for mild constipation.   fluticasone 50 MCG/ACT nasal spray Commonly known as:  FLONASE Place 1 spray into both nostrils daily as needed for allergies or rhinitis.   furosemide 20 MG tablet Commonly known as:  LASIX Take 1 tablet (20 mg total) by mouth daily. Take an extra tablet daily as needed for weight gain of >3 pounds What changed:    when to take this  reasons to take this  additional instructions   naphazoline-pheniramine 0.025-0.3 % ophthalmic solution Commonly known as:  NAPHCON-A Place 1 drop into both eyes daily as needed (dry eyes).   pantoprazole 40 MG tablet Commonly known as:  PROTONIX Take 1 tablet (40 mg total) by mouth daily.   polyethylene glycol packet Commonly known as:  MIRALAX / GLYCOLAX Take 17 g by mouth daily as needed for moderate constipation.   Potassium Chloride ER 20 MEQ Tbcr Take 20 mEq by mouth daily. Start taking on:  09/21/2017   Rivaroxaban 15 MG Tabs tablet Commonly known as:  XARELTO Take 1 tablet (15 mg total) by mouth daily with supper. RESUME ON 2 JAN   zolpidem 5 MG tablet Commonly known as:  AMBIEN Take 1 tablet (5 mg total) by mouth at bedtime.       Allergies  Allergen Reactions  . Sotalol Other (See Comments)    Prolonged QTc  . Alendronate Sodium Other (See Comments)    Arm pain  . Amiodarone Nausea Only  . Ciprofloxacin Other (See Comments)    Not effective  . Coreg [Carvedilol] Other (See Comments)    Fatigue   . Metoprolol Other (See Comments)    Profound fatigue    Consultations:  Cardiology   Procedures/Studies: Dg  Chest 2 View  Result Date: 09/17/2017 CLINICAL DATA:  Sudden onset shortness of breath EXAM: CHEST  2 VIEW COMPARISON:  09/08/2017 FINDINGS: Biventricular pacer leads from the left in stable position. Small pleural effusions with probable fissural fluid seen in the lateral projection. No Kerley lines. No pneumothorax. No air bronchograms or asymmetric opacity to suggest pneumonia. IMPRESSION: Chronic cardiomegaly and small pleural effusions. Electronically Signed   By: Monte Fantasia M.D.   On: 09/17/2017 19:33   Dg Chest 2 View  Result Date: 09/08/2017 CLINICAL DATA:  Cardiac device in situ.  AICD placement. EXAM: CHEST  2 VIEW COMPARISON:  One-view chest x-ray 09/05/2017 FINDINGS: Heart is enlarged. Bilateral pleural effusions are again noted. Bibasilar airspace disease likely reflects atelectasis. Overall aeration is improved. Atherosclerotic calcifications are present at the aortic arch. A pacemaker/AICD has been placed via a left subclavian approach. Right ventricular and coronary sinus leads are noted. There is no pneumothorax. IMPRESSION: 1. Interval placement of pacemaker/AICD via a left subclavian approach. No complications. 2. Improving aeration with persistent bilateral pleural effusions and associated atelectasis. Electronically Signed  By: San Morelle M.D.   On: 09/08/2017 10:15   Nm Hepatobiliary Liver Func  Result Date: 09/18/2017 CLINICAL DATA:  83 year old female with abdominal pain, nausea and vomiting. Subsequent encounter. EXAM: NUCLEAR MEDICINE HEPATOBILIARY IMAGING TECHNIQUE: Sequential images of the abdomen were obtained out to 60 minutes following intravenous administration of radiopharmaceutical. RADIOPHARMACEUTICALS:  5.2 mCi Tc-57m  Choletec IV COMPARISON:  09/17/2017 FINDINGS: Prompt uptake and biliary excretion of activity by the liver is seen. Gallbladder activity is visualized, consistent with patency of cystic duct. Biliary activity passes into small bowel,  consistent with patent common bile duct. Accumulation of radiotracer central aspect of the liver most likely related to stasis of radiotracer within common bile duct. Bile duct leak felt unlikely. IMPRESSION: Findings consistent with patent cystic duct and common bile duct. Electronically Signed   By: Genia Del M.D.   On: 09/18/2017 12:02   Ct Abdomen Pelvis W Contrast  Result Date: 09/18/2017 CLINICAL DATA:  Nausea and weakness. EXAM: CT ABDOMEN AND PELVIS WITH CONTRAST TECHNIQUE: Multidetector CT imaging of the abdomen and pelvis was performed using the standard protocol following bolus administration of intravenous contrast. CONTRAST:  161mL ISOVUE-300 IOPAMIDOL (ISOVUE-300) INJECTION 61% COMPARISON:  Right upper quadrant ultrasound from the same date. FINDINGS: Lower chest: Bilateral pleural effusions, right greater than left. Right lower lobe atelectasis. Single enlarged peri diaphragmatic lymph node measuring 13 mm in short axis. Hepatobiliary: Periportal edema. 1.2 cm hypoattenuated lesion in the right lobe of the liver measures water density and therefore likely represents a cyst. Indistinct edematous wall of the gallbladder with moderate amount of pericholecystic fluid. Pancreas: Unremarkable. No pancreatic ductal dilatation or surrounding inflammatory changes. Spleen: Normal in size without focal abnormality. Adrenals/Urinary Tract: Normal appearance of the adrenal glands. Bilateral cortical thinning. Large exophytic cyst off of the upper pole of the right kidney measuring 7.8 cm, which contains a calcified septum. Stomach/Bowel: Stomach is within normal limits. Appendix appears normal. No evidence of bowel wall thickening, distention, or inflammatory changes. Vascular/Lymphatic: Aortic atherosclerosis. No enlarged abdominal or pelvic lymph nodes. Reproductive: Female genitalia obscured by artifact from left hip arthroplasty. Other: No abdominal wall hernia or abnormality. No abdominopelvic ascites.  Musculoskeletal: Multilevel osteoarthritic changes of the spine. IMPRESSION: Indistinctness of the wall of the gallbladder with marked pericholecystic fluid. Associated periportal edema, particularly prominent in the liver hilum. These findings may be secondary to acalculous cholecystitis versus primary cholangiocarcinoma or hepatocellular carcinoma within the liver hilum. Alternatively, vascular injury to the liver and gallbladder, or liver congestion secondary to heart failure may produce similar appearance. The main portal vein and main hepatic artery are patent. Single enlarged para diaphragmatic lymph node. Large exophytic right renal cyst containing a calcified septum. Short-term follow-up with renal ultrasound may be considered. Bilateral pleural effusions, right greater than left. Electronically Signed   By: Fidela Salisbury M.D.   On: 09/18/2017 00:51   US Renal  Addendum Date: 09/18/2017   ADDENDUM REPORT: 09/18/2017 16:23 ADDENDUM: For follow-up of right upper pole 9.4 cm Bosniak 42F cyst, CT scan with and without contrast in 6-12 months recommended. Electronically Signed   By: Genia Del M.D.   On: 09/18/2017 16:23   Result Date: 09/18/2017 CLINICAL DATA:  83 year old female with renal cyst. Subsequent encounter. EXAM: RENAL / URINARY TRACT ULTRASOUND COMPLETE COMPARISON:  09/17/2017 CT. FINDINGS: Right Kidney: Length: 9.8 cm. 9.2 x 8.2 x 9.4 cm slightly complex cyst upper pole region with septation and small coarse calcifications. No hydronephrosis. Left Kidney: Length: 10.5 cm. Echogenicity  within normal limits. No mass or hydronephrosis visualized. Bladder: Appears normal for degree of bladder distention. Bilateral pleural effusions. IMPRESSION: Right upper pole 9.4 cm Bosniak 27F cyst. Follow-up in 6-12 months recommended for further delineation. Electronically Signed: By: Genia Del M.D. On: 09/18/2017 15:41   Dg Chest Port 1 View  Result Date: 09/05/2017 CLINICAL DATA:  Pulmonary  edema. EXAM: PORTABLE CHEST 1 VIEW COMPARISON:  09/04/2017. FINDINGS: Cardiomegaly with normal pulmonary vascularity. Dense progressed right perihilar right base atelectasis/ infiltrate. Increased right pleural effusion. Small left pleural effusion again noted. No pneumothorax. IMPRESSION: 1. Dense progressive right perihilar right base atelectasis/ infiltrate and progressive right pleural effusion. 2. Persistent left base atelectasis and infiltrate and small left pleural effusion unchanged. Electronically Signed   By: Marcello Moores  Register   On: 09/05/2017 07:47   Dg Chest Port 1 View  Result Date: 09/04/2017 CLINICAL DATA:  Pulmonary edema, post CPR EXAM: PORTABLE CHEST 1 VIEW COMPARISON:  09/03/2017 FINDINGS: There is hyperinflation of the lungs compatible with COPD. Cardiomegaly. Moderate layering bilateral effusions. Vascular congestion and bilateral lower lobe atelectasis or infiltrates. No pneumothorax. IMPRESSION: COPD, cardiomegaly. Moderate layering bilateral effusions with vascular congestion and bilateral lower lobe atelectasis or infiltrates. Electronically Signed   By: Rolm Baptise M.D.   On: 09/04/2017 07:40   Dg Chest Portable 1 View  Result Date: 09/03/2017 CLINICAL DATA:  Status post CPR. EXAM: PORTABLE CHEST 1 VIEW COMPARISON:  10/18/2015 FINDINGS: There is vascular congestion with hazy airspace lung opacity noted in the right mid and lower lung and at the left lung base. No gross pneumothorax on this supine exam. Cardiac silhouette is normal in size. No mediastinal or hilar masses. Skeletal structures are demineralized but grossly intact. IMPRESSION: 1. Vascular congestion with hazy lung opacities, right greater than left, consistent with asymmetric pulmonary edema. Electronically Signed   By: Lajean Manes M.D.   On: 09/03/2017 17:50   US Abdomen Limited Ruq  Result Date: 09/17/2017 CLINICAL DATA:  Elevated LFTs.  Abdominal pain. EXAM: ULTRASOUND ABDOMEN LIMITED RIGHT UPPER QUADRANT  COMPARISON:  None. FINDINGS: Gallbladder: Gallbladder is distended. Diffuse gallbladder wall thickening measuring 6 mm with edematous appearing wall. No shadowing gallstone. Small amount of pericholecystic fluid. No sonographic Murphy sign noted by sonographer. Common bile duct: Diameter: 3 mm, normal. Liver: No focal lesion identified. Heterogeneous and increased in parenchymal echogenicity. Portal vein is patent on color Doppler imaging with normal direction of blood flow towards the liver. Small amount perihepatic ascites. Incidental note of 7.4 cm cyst in the upper right kidney with possible internal septation. IMPRESSION: 1. Edematous gallbladder wall thickening measuring up to 6 mm. No gallstones. Small amount pericholecystic fluid and ascites. Findings may be secondary to acalculous cholecystitis. Wall thickening secondary to primary hepatic process such to hepatitis or systemic process such as heart failure could produce a similar appearance. 2. Mild hepatic heterogeneity and increased echogenicity suggesting steatosis. Electronically Signed   By: Jeb Levering M.D.   On: 09/17/2017 23:36       Subjective: No abdominal pain today. No chest pain or dyspnea.  Discharge Exam: Vitals:   09/20/17 0501 09/20/17 0943  BP: 101/63   Pulse:    Resp:    Temp: 98.7 F (37.1 C) 97.7 F (36.5 C)  SpO2:     Vitals:   09/19/17 1441 09/19/17 2016 09/20/17 0501 09/20/17 0943  BP: (!) 97/53 105/66 101/63   Pulse: 79 80    Resp: 17 18    Temp:  98.3 F (  36.8 C) 98.7 F (37.1 C) 97.7 F (36.5 C)  TempSrc: Oral Oral Oral Oral  SpO2: 91% 92%    Weight:   64.7 kg (142 lb 10.2 oz)   Height:        General exam: Appears calm and comfortable Respiratory system: Clear to auscultation. Respiratory effort normal. Cardiovascular system: S1 & S2 heard, RRR. No murmurs, rubs, gallops or clicks. Gastrointestinal system: Abdomen is nondistended, soft and nontender. No organomegaly or masses felt.  Normal bowel sounds heard. Central nervous system: Alert and oriented. No focal neurological deficits. Extremities: No edema. No calf tenderness Skin: No cyanosis. No rashes Psychiatry: Judgement and insight appear normal. Mood & affect appropriate.    The results of significant diagnostics from this hospitalization (including imaging, microbiology, ancillary and laboratory) are listed below for reference.     Microbiology: Recent Results (from the past 240 hour(s))  Blood culture (routine x 2)     Status: None (Preliminary result)   Collection Time: 09/18/17 12:18 AM  Result Value Ref Range Status   Specimen Description BLOOD RIGHT ANTECUBITAL  Final   Special Requests   Final    BOTTLES DRAWN AEROBIC AND ANAEROBIC Blood Culture adequate volume   Culture NO GROWTH 2 DAYS  Final   Report Status PENDING  Incomplete  Blood culture (routine x 2)     Status: None (Preliminary result)   Collection Time: 09/18/17 12:23 AM  Result Value Ref Range Status   Specimen Description BLOOD RIGHT WRIST  Final   Special Requests   Final    BOTTLES DRAWN AEROBIC ONLY Blood Culture results may not be optimal due to an inadequate volume of blood received in culture bottles   Culture NO GROWTH 2 DAYS  Final   Report Status PENDING  Incomplete     Labs: BNP (last 3 results) Recent Labs    09/17/17 2038  BNP 2,505.3*   Basic Metabolic Panel: Recent Labs  Lab 09/17/17 2038 09/18/17 0418 09/19/17 0232 09/20/17 0333  NA 138 138 136 138  K 3.9 3.7 3.2* 3.0*  CL 109 102 98* 102  CO2 20* 21* 25 27  GLUCOSE 117* 107* 88 116*  BUN 18 17 20  21*  CREATININE 0.93 0.96 1.05* 0.88  CALCIUM 8.1* 8.6* 8.3* 8.2*  MG  --  1.9  --   --    Liver Function Tests: Recent Labs  Lab 09/17/17 2038 09/18/17 0418 09/19/17 0232 09/20/17 0333  AST 113* 107* 83* 47*  ALT 62* 74* 67* 47  ALKPHOS 88 92 90 87  BILITOT 1.1 1.4* 1.1 0.9  PROT 5.9* 6.4* 5.7* 5.7*  ALBUMIN 2.9* 3.1* 2.8* 2.6*   Recent Labs   Lab 09/17/17 2038  LIPASE 27   No results for input(s): AMMONIA in the last 168 hours. CBC: Recent Labs  Lab 09/17/17 2038 09/18/17 0418  WBC 9.7 9.5  NEUTROABS 8.1*  --   HGB 13.6 13.3  HCT 40.4 39.9  MCV 92.7 91.7  PLT 210 231   Cardiac Enzymes: No results for input(s): CKTOTAL, CKMB, CKMBINDEX, TROPONINI in the last 168 hours. BNP: Invalid input(s): POCBNP CBG: Recent Labs  Lab 09/18/17 1607 09/19/17 0012 09/19/17 0824 09/19/17 1622 09/20/17 0005  GLUCAP 129* 97 153* 112* 110*   D-Dimer No results for input(s): DDIMER in the last 72 hours. Hgb A1c No results for input(s): HGBA1C in the last 72 hours. Lipid Profile No results for input(s): CHOL, HDL, LDLCALC, TRIG, CHOLHDL, LDLDIRECT in the last 72 hours.  Thyroid function studies No results for input(s): TSH, T4TOTAL, T3FREE, THYROIDAB in the last 72 hours.  Invalid input(s): FREET3 Anemia work up No results for input(s): VITAMINB12, FOLATE, FERRITIN, TIBC, IRON, RETICCTPCT in the last 72 hours. Urinalysis    Component Value Date/Time   COLORURINE AMBER (A) 09/17/2017 1938   APPEARANCEUR HAZY (A) 09/17/2017 1938   LABSPEC 1.026 09/17/2017 1938   PHURINE 5.0 09/17/2017 1938   GLUCOSEU NEGATIVE 09/17/2017 1938   HGBUR MODERATE (A) 09/17/2017 1938   BILIRUBINUR NEGATIVE 09/17/2017 1938   KETONESUR NEGATIVE 09/17/2017 1938   PROTEINUR 30 (A) 09/17/2017 1938   UROBILINOGEN 0.2 03/17/2013 1423   NITRITE NEGATIVE 09/17/2017 1938   LEUKOCYTESUR SMALL (A) 09/17/2017 1938   Sepsis Labs Invalid input(s): PROCALCITONIN,  WBC,  LACTICIDVEN Microbiology Recent Results (from the past 240 hour(s))  Blood culture (routine x 2)     Status: None (Preliminary result)   Collection Time: 09/18/17 12:18 AM  Result Value Ref Range Status   Specimen Description BLOOD RIGHT ANTECUBITAL  Final   Special Requests   Final    BOTTLES DRAWN AEROBIC AND ANAEROBIC Blood Culture adequate volume   Culture NO GROWTH 2 DAYS  Final    Report Status PENDING  Incomplete  Blood culture (routine x 2)     Status: None (Preliminary result)   Collection Time: 09/18/17 12:23 AM  Result Value Ref Range Status   Specimen Description BLOOD RIGHT WRIST  Final   Special Requests   Final    BOTTLES DRAWN AEROBIC ONLY Blood Culture results may not be optimal due to an inadequate volume of blood received in culture bottles   Culture NO GROWTH 2 DAYS  Final   Report Status PENDING  Incomplete     Time coordinating discharge: Over 30 minutes  SIGNED:   Cordelia Poche, MD [facility] 09/20/2017, 3:25 PM Pager [phone]  If 7PM-7AM, please contact night-coverage [email or web address] Password [email or web address]

## 2017-09-20 NOTE — Discharge Instructions (Signed)
Heart Failure Action Plan A heart failure action plan helps you understand what to do when you have symptoms of heart failure. Follow the plan that was created by you and your health care provider. Review your plan each time you visit your health care provider. Red zone These signs and symptoms mean you should get medical help right away:  You have trouble breathing when resting.  You have a dry cough that is getting worse.  You have swelling or pain in your legs or abdomen that is getting worse.  You suddenly gain more than 2-3 lb (0.9-1.4 kg) in a day, or more than 5 lb (2.3 kg) in one week. This amount may be more or less depending on your condition.  You have trouble staying awake or you feel confused.  You have chest pain.  You do not have an appetite.  You pass out.  If you experience any of these symptoms:  Call your local emergency services (911 in the [place] right away or seek help at the emergency department of the nearest hospital.  Yellow zone These signs and symptoms mean your condition may be getting worse and you should make some changes:  You have trouble breathing when you are active or you need to sleep with extra pillows.  You have swelling in your legs or abdomen.  You gain 2-3 lb (0.9-1.4 kg) in one day, or 5 lb (2.3 kg) in one week. This amount may be more or less depending on your condition.  You get tired easily.  You have trouble sleeping.  You have a dry cough.  If you experience any of these symptoms:  Contact your health care provider within the next day.  Your health care provider may adjust your medicines.  Green zone These signs mean you are doing well and can continue what you are doing:  You do not have shortness of breath.  You have very little swelling or no new swelling.  Your weight is stable (no gain or loss).  You have a normal activity level.  You do not have chest pain or any other new symptoms.  Follow these  instructions at home:  Take over-the-counter and prescription medicines only as told by your health care provider.  Weigh yourself daily. Your target weight is __________ lb (__________ kg). ? Call your health care provider if you gain more than __________ lb (__________ kg) in a day, or more than __________ lb (__________ kg) in one week.  Eat a heart-healthy diet. Work with a diet and nutrition specialist (dietitian) to create an eating plan that is best for you.  Keep all follow-up visits as told by your health care provider. This is important. Where to find more information:  American Heart Association: www.heart.org Summary  Follow the action plan that was created by you and your health care provider.  Get help right away if you have any symptoms in the Red zone. This information is not intended to replace advice given to you by your health care provider. Make sure you discuss any questions you have with your health care provider. Document Released: 10/07/2016 Document Revised: 10/07/2016 Document Reviewed: 10/07/2016 Elsevier Interactive Patient Education  2018 [employer]  

## 2017-09-20 NOTE — Progress Notes (Signed)
Physical Therapy Treatment Patient Details Name: Cheryl Vargas MRN: [id number] DOB: 12-23-1934 Today's Date: 09/20/2017    History of Present Illness 83 yo female with recent systolic and diastolic CHF with VT arrest who is s/p ICD placement and recent dx of A-fib, presented to ED with progressive weakness and fatigue, nausea, vomiting. Concern for hepatocellular carcinoma vs cholangiocarcinoma vs calculs cholecystitis vs CHF related congestion. PMH CHF, dysrhythmia, enlarged heart with MR, cardiomyopathy, A-fib, ICD insertion 09/07/17, hx cardiac cath and cardioversion, anterior THR L 2014     PT Comments    Pt was seen for there ex as she had just gotten OOB to work with OT and was tired.  Agreed to PT bed ex and was able to work with limitations of her rib fractures and her mm strength losses.  Pt is planning a short rehab stay and should be able to progress to home afterward.  Follow acutely as needed and ordered.  Follow Up Recommendations  SNF     Equipment Recommendations  None recommended by PT    Recommendations for Other Services       Precautions / Restrictions Precautions Precautions: ICD/Pacemaker Restrictions Weight Bearing Restrictions: Yes Other Position/Activity Restrictions: ok to use BUE for RW     Mobility  Bed Mobility Overal bed mobility: Modified Independent                Transfers Overall transfer level: Needs assistance Equipment used: 1 person hand held assist Transfers: Sit to/from Stand Sit to Stand: Min guard         General transfer comment: min A to steady pt. " I just feel off balance"  Ambulation/Gait                 Stairs            Wheelchair Mobility    Modified Rankin (Stroke Patients Only)       Balance Overall balance assessment: Needs assistance Sitting-balance support: Bilateral upper extremity supported;Feet supported Sitting balance-Leahy Scale: Good     Standing balance support: During  functional activity Standing balance-Leahy Scale: Fair Standing balance comment: occasional Min assist for balance when ambulating with no device                             Cognition Arousal/Alertness: Awake/alert Behavior During Therapy: WFL for tasks assessed/performed Overall Cognitive Status: Within Functional Limits for tasks assessed                                        Exercises General Exercises - Lower Extremity Ankle Circles/Pumps: AROM;AAROM;Both;5 reps Quad Sets: AROM;Both;10 reps Gluteal Sets: AROM;Both;15 reps Heel Slides: AROM;Both;10 reps Hip ABduction/ADduction: AROM;Both;10 reps Hip Flexion/Marching: AROM;Both;10 reps    General Comments        Pertinent Vitals/Pain Pain Assessment: 0-10 Faces Pain Scale: Hurts little more Pain Location: L shoulder/chest Pain Descriptors / Indicators: Tightness;Sore Pain Intervention(s): Limited activity within patient's tolerance    Home Living Family/patient expects to be discharged to:: Skilled nursing facility Living Arrangements: Alone Available Help at Discharge: Family;Available 24 hours/day Type of Home: (condo ) Home Access: Level entry   Home Layout: One level Home Equipment: Walker - 2 wheels;Walker - 4 wheels;Bedside commode;Transport chair      Prior Function Level of Independence: Independent      Comments: Active. Drives.  Does her own housekeeping and grocery shopping.   PT Goals (current goals can now be found in the care plan section) Acute Rehab PT Goals Patient Stated Goal: to be able to get home to her cas Progress towards PT goals: Progressing toward goals    Frequency    Min 3X/week      PT Plan Current plan remains appropriate    Co-evaluation              AM-PAC PT "6 Clicks" Daily Activity  Outcome Measure  Difficulty turning over in bed (including adjusting bedclothes, sheets and blankets)?: A Lot Difficulty moving from lying on back to  sitting on the side of the bed? : Unable Difficulty sitting down on and standing up from a chair with arms (e.g., wheelchair, bedside commode, etc,.)?: Unable Help needed moving to and from a bed to chair (including a wheelchair)?: A Little Help needed walking in hospital room?: A Little Help needed climbing 3-5 steps with a railing? : A Little 6 Click Score: 13    End of Session   Activity Tolerance: Patient limited by fatigue Patient left: in bed;with call bell/phone within reach Nurse Communication: Mobility status PT Visit Diagnosis: Unsteadiness on feet (R26.81);Muscle weakness (generalized) (M62.81);Other abnormalities of gait and mobility (R26.89) Pain - Right/Left: Left Pain - part of body: Shoulder     Time: 1419-1440 PT Time Calculation (min) (ACUTE ONLY): 21 min  Charges:                       G Codes:        Ramond Dial 10-16-17, 3:04 PM   Mee Hives, PT MS Acute Rehab Dept. Number: [place]

## 2017-09-20 NOTE — Progress Notes (Signed)
PROGRESS NOTE    Cheryl Vargas  TOI:[id number] DOB: 1935-02-22 DOA: 09/17/2017 PCP: Lavone Orn, MD   Brief Narrative: Cheryl Vargas is a 83 y.o.  female with history of recently diagnosed combined systolic and diastolic CHF with VT arrest status post ICD placement and recently diagnosed atrial fibrillation. She presented with abdominal pain, nausea and vomiting and found to have acute heart failure.   Assessment & Plan:   Principal Problem:   Abdominal pain Active Problems:   Paroxysmal atrial fibrillation (HCC)   Acute on chronic combined systolic (congestive) and diastolic (congestive) heart failure (HCC)   Severe mitral valve regurgitation   CHF (congestive heart failure) (HCC)   Abdominal pain Nausea and vomiting Resolved. Likely secondary to hepatic congestion in setting of heart failure. CT scan abnormal with normal HIDA scan.  Acute on chronic combined systolic and diastolic heart failure Patient is s/p ICD placement after V-tach arrest. Diuresed well. Last EF of 25-30% with diffuse hypokinesis on 12/25. -Cardiology recommendations: restart lasix at 20 mg dose daily -Kdur 20 meq daily  Renal cyst Outpatient follow-up. Repeat imaging in 6-12 months  Mitral regurgitation Outpatient cardiology follow-up.  Abnormal CT scan HIDA scan normal. Discussed with radiology and likely not cholangiocarcinoma or hepatocellular carcinoma but rather significant edema.    DVT prophylaxis: Lovenox Code Status: Full code Family Communication: None at bedside Disposition Plan: Discharge to SNF   Consultants:   Cardiology  General surgery  Procedures:   None  Antimicrobials:  Zosyn (1/7>>1/8)    Subjective: No chest pain or dyspnea. No abdominal pain.  Objective: Vitals:   09/19/17 1441 09/19/17 2016 09/20/17 0501 09/20/17 0943  BP: (!) 97/53 105/66 101/63   Pulse: 79 80    Resp: 17 18    Temp:  98.3 F (36.8 C) 98.7 F (37.1 C) 97.7 F (36.5 C)    TempSrc: Oral Oral Oral Oral  SpO2: 91% 92%    Weight:   64.7 kg (142 lb 10.2 oz)   Height:        Intake/Output Summary (Last 24 hours) at 09/20/2017 1230 Last data filed at 09/20/2017 0100 Gross per 24 hour  Intake 240 ml  Output 700 ml  Net -460 ml   Filed Weights   09/17/17 1839 09/19/17 0538 09/20/17 0501  Weight: 66.2 kg (146 lb) 64.7 kg (142 lb 9.6 oz) 64.7 kg (142 lb 10.2 oz)    Examination:  General exam: Appears calm and comfortable Respiratory system: Clear to auscultation. Respiratory effort normal. Cardiovascular system: S1 & S2 heard, RRR. No murmurs, rubs, gallops or clicks. Gastrointestinal system: Abdomen is nondistended, soft and nontender. No organomegaly or masses felt. Normal bowel sounds heard. Central nervous system: Alert and oriented. No focal neurological deficits. Extremities: No edema. No calf tenderness Skin: No cyanosis. No rashes Psychiatry: Judgement and insight appear normal. Mood & affect appropriate.     Data Reviewed: I have personally reviewed following labs and imaging studies  CBC: Recent Labs  Lab 09/17/17 2038 09/18/17 0418  WBC 9.7 9.5  NEUTROABS 8.1*  --   HGB 13.6 13.3  HCT 40.4 39.9  MCV 92.7 91.7  PLT 210 833   Basic Metabolic Panel: Recent Labs  Lab 09/17/17 2038 09/18/17 0418 09/19/17 0232 09/20/17 0333  NA 138 138 136 138  K 3.9 3.7 3.2* 3.0*  CL 109 102 98* 102  CO2 20* 21* 25 27  GLUCOSE 117* 107* 88 116*  BUN 18 17 20  21*  CREATININE 0.93 0.96  1.05* 0.88  CALCIUM 8.1* 8.6* 8.3* 8.2*  MG  --  1.9  --   --    GFR: Estimated Creatinine Clearance: 45.9 mL/min (by C-G formula based on SCr of 0.88 mg/dL). Liver Function Tests: Recent Labs  Lab 09/17/17 2038 09/18/17 0418 09/19/17 0232 09/20/17 0333  AST 113* 107* 83* 47*  ALT 62* 74* 67* 47  ALKPHOS 88 92 90 87  BILITOT 1.1 1.4* 1.1 0.9  PROT 5.9* 6.4* 5.7* 5.7*  ALBUMIN 2.9* 3.1* 2.8* 2.6*   Recent Labs  Lab 09/17/17 2038  LIPASE 27   No  results for input(s): AMMONIA in the last 168 hours. Coagulation Profile: No results for input(s): INR, PROTIME in the last 168 hours. Cardiac Enzymes: No results for input(s): CKTOTAL, CKMB, CKMBINDEX, TROPONINI in the last 168 hours. BNP (last 3 results) No results for input(s): PROBNP in the last 8760 hours. HbA1C: No results for input(s): HGBA1C in the last 72 hours. CBG: Recent Labs  Lab 09/18/17 1607 09/19/17 0012 09/19/17 0824 09/19/17 1622 09/20/17 0005  GLUCAP 129* 97 153* 112* 110*   Lipid Profile: No results for input(s): CHOL, HDL, LDLCALC, TRIG, CHOLHDL, LDLDIRECT in the last 72 hours. Thyroid Function Tests: No results for input(s): TSH, T4TOTAL, FREET4, T3FREE, THYROIDAB in the last 72 hours. Anemia Panel: No results for input(s): VITAMINB12, FOLATE, FERRITIN, TIBC, IRON, RETICCTPCT in the last 72 hours. Sepsis Labs: Recent Labs  Lab 09/18/17 0029 09/18/17 0207  LATICACIDVEN 2.20* 2.59*    Recent Results (from the past 240 hour(s))  Blood culture (routine x 2)     Status: None (Preliminary result)   Collection Time: 09/18/17 12:18 AM  Result Value Ref Range Status   Specimen Description BLOOD RIGHT ANTECUBITAL  Final   Special Requests   Final    BOTTLES DRAWN AEROBIC AND ANAEROBIC Blood Culture adequate volume   Culture NO GROWTH 1 DAY  Final   Report Status PENDING  Incomplete  Blood culture (routine x 2)     Status: None (Preliminary result)   Collection Time: 09/18/17 12:23 AM  Result Value Ref Range Status   Specimen Description BLOOD RIGHT WRIST  Final   Special Requests   Final    BOTTLES DRAWN AEROBIC ONLY Blood Culture results may not be optimal due to an inadequate volume of blood received in culture bottles   Culture NO GROWTH 1 DAY  Final   Report Status PENDING  Incomplete         Radiology Studies: US Renal  Addendum Date: 09/18/2017   ADDENDUM REPORT: 09/18/2017 16:23 ADDENDUM: For follow-up of right upper pole 9.4 cm Bosniak 67F  cyst, CT scan with and without contrast in 6-12 months recommended. Electronically Signed   By: Genia Del M.D.   On: 09/18/2017 16:23   Result Date: 09/18/2017 CLINICAL DATA:  83 year old female with renal cyst. Subsequent encounter. EXAM: RENAL / URINARY TRACT ULTRASOUND COMPLETE COMPARISON:  09/17/2017 CT. FINDINGS: Right Kidney: Length: 9.8 cm. 9.2 x 8.2 x 9.4 cm slightly complex cyst upper pole region with septation and small coarse calcifications. No hydronephrosis. Left Kidney: Length: 10.5 cm. Echogenicity within normal limits. No mass or hydronephrosis visualized. Bladder: Appears normal for degree of bladder distention. Bilateral pleural effusions. IMPRESSION: Right upper pole 9.4 cm Bosniak 67F cyst. Follow-up in 6-12 months recommended for further delineation. Electronically Signed: By: Genia Del M.D. On: 09/18/2017 15:41        Scheduled Meds: . bisoprolol  10 mg Oral Daily  .  Rivaroxaban  15 mg Oral Q supper  . zolpidem  5 mg Oral QHS   Continuous Infusions:   LOS: 3 days     Cordelia Poche, MD [facility] 09/20/2017, 12:30 PM Pager: [phone]  If 7PM-7AM, please contact night-coverage [email or web address] Password [email or web address] 09/20/2017, 12:30 PM

## 2017-09-20 NOTE — Care Management Note (Signed)
Case Management Note Marvetta Gibbons RN, BSN Unit 4E-Case Manager [phone]  Patient Details  Name: Cheryl Vargas MRN: [id number] Date of Birth: 07-10-1935  Subjective/Objective:    Pt admitted with  Dizziness N/V,  abd pain- Hida scan negative              Action/Plan: PTA pt lived at home alone- son has been staying with pt since last discharge home end of dec. - pt is active with [facility] for HHRN/PT- will need resumption order if returns home- PT/OT evals have been ordered- pt may need STSNF- prior to return home- CSW has been consulted.   Expected Discharge Date:  09/20/17               Expected Discharge Plan:  Skilled Nursing Facility  In-House Referral:  Clinical Social Work  Discharge planning Services  CM Consult  Post Acute Care Choice:  Home Health, Resumption of Svcs/PTA Provider Choice offered to:     DME Arranged:    DME Agency:     HH Arranged:    [place] Agency:  [facility]  Status of Service:  Completed, signed off  If discussed at [place] of Stay Meetings, dates discussed:    Discharge Disposition: skilled facility   Additional Comments:   09/20/17- 1530 - Marvetta Gibbons RN, CM- pt stable for discharge today per MD- recommendations have been made for SNF per PT/OT- CSW following for placement needs- plan for [facility] per CSW pending auth from insurance- have notified [place] with [facility] of plan for SNF - she will f/u at the SNF.   Dawayne Patricia, RN 09/20/2017, 3:46 PM

## 2017-09-20 NOTE — Progress Notes (Signed)
Clinical Social Worker facilitated patient discharge including contacting patient family and facility to confirm patient discharge plans.  Clinical information faxed to facility and family agreeable with plan.  Patient step daughter will be taking patient back via personal vehicle  .  RN Jenny Reichmann to call [phone] (pt will go in room 610A) for  report prior to discharge.  Clinical Social Worker will sign off for now as social work intervention is no longer needed. Please consult [place] again if new need arises.  Rhea Pink, MSW, [place]

## 2017-09-20 NOTE — Progress Notes (Signed)
Progress Note  Patient Name: Cheryl Vargas Date of Encounter: 09/20/2017  Primary Cardiologist:   [name], MD   Subjective   She is breathing back to baseline.    Inpatient Medications    Scheduled Meds: . bisoprolol  10 mg Oral Daily  . enoxaparin (LOVENOX) injection  40 mg Subcutaneous Q24H  . potassium chloride  40 mEq Oral Q4H  . zolpidem  5 mg Oral QHS   Continuous Infusions:  PRN Meds: acetaminophen **OR** acetaminophen, ondansetron **OR** ondansetron (ZOFRAN) IV, polyethylene glycol   Vital Signs    Vitals:   09/19/17 1441 09/19/17 2016 09/20/17 0501 09/20/17 0943  BP: (!) 97/53 105/66 101/63   Pulse: 79 80    Resp: 17 18    Temp:  98.3 F (36.8 C) 98.7 F (37.1 C) 97.7 F (36.5 C)  TempSrc: Oral Oral Oral Oral  SpO2: 91% 92%    Weight:   142 lb 10.2 oz (64.7 kg)   Height:        Intake/Output Summary (Last 24 hours) at 09/20/2017 1016 Last data filed at 09/20/2017 0100 Gross per 24 hour  Intake 240 ml  Output 700 ml  Net -460 ml   Filed Weights   09/17/17 1839 09/19/17 0538 09/20/17 0501  Weight: 146 lb (66.2 kg) 142 lb 9.6 oz (64.7 kg) 142 lb 10.2 oz (64.7 kg)    Telemetry    Probable atrial fib with ventricular pacing - Personally Reviewed  ECG    NA - Personally Reviewed  Physical Exam   GEN: No acute distress.   Neck: No  JVD Cardiac: RRR, no murmurs, rubs, or gallops.  Respiratory: Clear  to auscultation bilaterally. GI: Soft, nontender, non-distended  MS: No  edema; No deformity. Neuro:  Nonfocal  Psych: Normal affect   Labs    Chemistry Recent Labs  Lab 09/18/17 0418 09/19/17 0232 09/20/17 0333  NA 138 136 138  K 3.7 3.2* 3.0*  CL 102 98* 102  CO2 21* 25 27  GLUCOSE 107* 88 116*  BUN 17 20 21*  CREATININE 0.96 1.05* 0.88  CALCIUM 8.6* 8.3* 8.2*  PROT 6.4* 5.7* 5.7*  ALBUMIN 3.1* 2.8* 2.6*  AST 107* 83* 47*  ALT 74* 67* 47  ALKPHOS 92 90 87  BILITOT 1.4* 1.1 0.9  GFRNONAA 54* 49* >60  GFRAA >60 57*  >60  ANIONGAP 15 13 9      Hematology Recent Labs  Lab 09/17/17 2038 09/18/17 0418  WBC 9.7 9.5  RBC 4.36 4.35  HGB 13.6 13.3  HCT 40.4 39.9  MCV 92.7 91.7  MCH 31.2 30.6  MCHC 33.7 33.3  RDW 14.3 13.7  PLT 210 231    Cardiac EnzymesNo results for input(s): TROPONINI in the last 168 hours.  Recent Labs  Lab 09/17/17 2053  TROPIPOC 0.03     BNP Recent Labs  Lab 09/17/17 2038  BNP 1,492.6*     DDimer No results for input(s): DDIMER in the last 168 hours.   Radiology    Nm Hepatobiliary Liver Func  Result Date: 09/18/2017 CLINICAL DATA:  83 year old female with abdominal pain, nausea and vomiting. Subsequent encounter. EXAM: NUCLEAR MEDICINE HEPATOBILIARY IMAGING TECHNIQUE: Sequential images of the abdomen were obtained out to 60 minutes following intravenous administration of radiopharmaceutical. RADIOPHARMACEUTICALS:  5.2 mCi Tc-39m  Choletec IV COMPARISON:  09/17/2017 FINDINGS: Prompt uptake and biliary excretion of activity by the liver is seen. Gallbladder activity is visualized, consistent with patency of cystic duct. Biliary activity passes  into small bowel, consistent with patent common bile duct. Accumulation of radiotracer central aspect of the liver most likely related to stasis of radiotracer within common bile duct. Bile duct leak felt unlikely. IMPRESSION: Findings consistent with patent cystic duct and common bile duct. Electronically Signed   By: Genia Del M.D.   On: 09/18/2017 12:02   US Renal  Addendum Date: 09/18/2017   ADDENDUM REPORT: 09/18/2017 16:23 ADDENDUM: For follow-up of right upper pole 9.4 cm Bosniak 66F cyst, CT scan with and without contrast in 6-12 months recommended. Electronically Signed   By: Genia Del M.D.   On: 09/18/2017 16:23   Result Date: 09/18/2017 CLINICAL DATA:  83 year old female with renal cyst. Subsequent encounter. EXAM: RENAL / URINARY TRACT ULTRASOUND COMPLETE COMPARISON:  09/17/2017 CT. FINDINGS: Right Kidney: Length:  9.8 cm. 9.2 x 8.2 x 9.4 cm slightly complex cyst upper pole region with septation and small coarse calcifications. No hydronephrosis. Left Kidney: Length: 10.5 cm. Echogenicity within normal limits. No mass or hydronephrosis visualized. Bladder: Appears normal for degree of bladder distention. Bilateral pleural effusions. IMPRESSION: Right upper pole 9.4 cm Bosniak 66F cyst. Follow-up in 6-12 months recommended for further delineation. Electronically Signed: By: Genia Del M.D. On: 09/18/2017 15:41    Cardiac Studies   NA  Patient Profile     83 y.o. female with perment atrial fibrillation, VT s/p recent BiV ICD 75/10/25, chronic systolic CHF presented forprogressive weakness and fatigue with nausea vomiting and poor appetite.She also has dyspnea. CXr with small pleural effusion. Elevated LFTs.by CT of the abdomen and pelvis which was showing features concerning for possible acalculous cholecystitis versus congestion from CHF versus hepatic malignancy.Breathing improved with IV lasix x 1.Device interrogation demonstrated normally functioning device  Assessment & Plan    ACUTE ON CHRONIC SYSTOLIC HF:  I will resume PO 20 mg Lasix daily.  She can take an extra 20 mg PRN weight gain.  Needs daily Kdur at discharge and I will defer to the discharging team as they are doing the discharge med rec.   ATRIAL FIB:  Resume previous dose of Xarelto.    MR:  Moderate.  Follow.    She has follow up scheduled with Dr. Aundra Dubin on Monday.  I will send a message to follow up on Potassium.  She should be on at least 20 meq daily at discharge.    For questions or updates, please contact [place] Please consult [email or web address] for contact info under Cardiology/STEMI.   Signed, Minus Breeding, MD  09/20/2017, 10:16 AM

## 2017-09-20 NOTE — NC FL2 (Signed)
[place] LEVEL OF CARE SCREENING TOOL     IDENTIFICATION  Patient Name: Cheryl Vargas Birthdate: 10-17-1934 Sex: female Admission Date (Current Location): 09/17/2017  [facility] and [place] Number:  Herbalist and Address:  [facility], [place] [address], [place], [place] [address]      Provider Number: [id number]  Attending Physician Name and Address:  Mariel Aloe, MD  Relative Name and Phone Number:  Courtney Paris, [phone]    Current Level of Care: Hospital Recommended Level of Care: [place] Prior Approval Number:    Date Approved/Denied:   PASRR Number: [id number]  Discharge Plan: SNF    Current Diagnoses: Patient Active Problem List   Diagnosis Date Noted  . Abdominal pain 09/18/2017  . CHF (congestive heart failure) ([place]) 09/17/2017  . Pulmonary edema   . Cardiac device in situ   . Ventricular tachycardia ([place])   . Cardiac arrest ([place])   . Systolic and diastolic CHF, acute ([place])   . Severe mitral valve regurgitation   . A-fib ([place]) 09/03/2017  . Mitral regurgitation   . Chronic systolic (congestive) heart failure ([place])   . Non-ischemic cardiomyopathy ([place])   . CHF (congestive heart failure), NYHA class IV ([place]) 04/24/2016  . Dizziness 12/01/2015  . Hypertension 12/01/2015  . Insomnia 12/01/2015  . Acute on chronic combined systolic (congestive) and diastolic (congestive) heart failure ([place]) 12/01/2015  . Paroxysmal atrial fibrillation (HCC)   . Closed left hip fracture ([place]) 03/17/2013  . Cardiomegaly 03/17/2013  . PVC (premature ventricular contraction) 03/17/2013  . Hyperlipidemia 03/17/2013    Orientation RESPIRATION BLADDER Height & Weight     Self, Time, Situation, Place  Normal Continent Weight: 142 lb 10.2 oz (64.7 kg) Height:  5\' 5"  (165.1 cm)  BEHAVIORAL SYMPTOMS/MOOD NEUROLOGICAL BOWEL NUTRITION STATUS      Continent Diet(heart room)  AMBULATORY STATUS COMMUNICATION OF  NEEDS Skin   Limited Assist Verbally Normal                       Personal Care Assistance Level of Assistance  Bathing, Feeding, Dressing Bathing Assistance: Limited assistance Feeding assistance: Independent Dressing Assistance: Limited assistance     Functional Limitations Info  Sight, Hearing, Speech Sight Info: Adequate Hearing Info: Adequate Speech Info: Adequate    SPECIAL CARE FACTORS FREQUENCY  PT (By licensed PT), OT (By licensed OT)     PT Frequency: 5x wk OT Frequency: 5x wk            Contractures Contractures Info: Not present    Additional Factors Info  Code Status, Allergies Code Status Info: Full Code Allergies Info: SOTALOL, ALENDRONATE SODIUM, AMIODARONE, CIPROFLOXACIN, COREG CARVEDILOL, METOPROLOL           Current Medications (09/20/2017):  This is the current hospital active medication list Current Facility-Administered Medications  Medication Dose Route Frequency Provider Last Rate Last Dose  . acetaminophen (TYLENOL) tablet 650 mg  650 mg Oral Q6H PRN Rise Patience, MD   650 mg at 09/20/17 0941   Or  . acetaminophen (TYLENOL) suppository 650 mg  650 mg Rectal Q6H PRN Rise Patience, MD      . bisoprolol (ZEBETA) tablet 10 mg  10 mg Oral Daily Rise Patience, MD   10 mg at 09/20/17 0941  . ondansetron (ZOFRAN) tablet 4 mg  4 mg Oral Q6H PRN Rise Patience, MD       Or  .  ondansetron (ZOFRAN) injection 4 mg  4 mg Intravenous Q6H PRN Rise Patience, MD      . polyethylene glycol (MIRALAX / GLYCOLAX) packet 17 g  17 g Oral Daily PRN Gardiner Barefoot, NP   17 g at 09/19/17 2030  . Rivaroxaban (XARELTO) tablet 15 mg  15 mg Oral Q supper Mariel Aloe, MD      . zolpidem (AMBIEN) tablet 5 mg  5 mg Oral QHS Rise Patience, MD   5 mg at 09/19/17 2029     Discharge Medications: Please see discharge summary for a list of discharge medications.  Relevant Imaging Results:  Relevant Lab  Results:   Additional Information [id number]  Wende Neighbors, LCSW

## 2017-09-21 ENCOUNTER — Telehealth: Payer: Self-pay | Admitting: Internal Medicine

## 2017-09-21 NOTE — Telephone Encounter (Signed)
LVM on Cheryl Vargas ( ok per DPR) that if at all possible to have her home monitor there with her.

## 2017-09-21 NOTE — Telephone Encounter (Signed)
New Message    1. Has your device fired? no 2. Is you device beeping? no  3. Are you experiencing draining or swelling at device site? no  4. Are you calling to see if we received your device transmission? no  5. Have you passed out? No  Patient is calling in to see what she needs to do with her medtronix. She is currently at [facility] inpatient and needs to know if she needs to have the equipment brought there since she is not sure when she will return home. Please call.    Please route to [place]

## 2017-09-23 LAB — CULTURE, BLOOD (ROUTINE X 2)
Culture: NO GROWTH
Culture: NO GROWTH
Special Requests: ADEQUATE

## 2017-09-24 ENCOUNTER — Telehealth: Payer: Self-pay | Admitting: Physician Assistant

## 2017-09-24 ENCOUNTER — Inpatient Hospital Stay (HOSPITAL_COMMUNITY): Payer: PPO | Admitting: Cardiology

## 2017-09-24 NOTE — Telephone Encounter (Signed)
°  1. Has your device fired? no  2. Is you device beeping? no  3. Are you experiencing draining or swelling at device site? no  4. Are you calling to see if we received your device transmission? no  5. Have you passed out? No  Patient is calling for instructions for sending transmission over the phone. Patient is in nursing facility.    Please route to [facility]

## 2017-09-24 NOTE — Telephone Encounter (Signed)
Spoke with pt, transmission received, will assess wound at 10/04/17 apt.

## 2017-09-24 NOTE — Telephone Encounter (Signed)
Patient called to cancel "wound check" appt . Patient states that it is a hardship to try to get to the office, she is in a nursing facility. Patient would like to know how she should go about coordinating care.

## 2017-09-24 NOTE — Telephone Encounter (Signed)
See previous phone note.  

## 2017-09-26 ENCOUNTER — Ambulatory Visit: Payer: PPO

## 2017-10-03 ENCOUNTER — Other Ambulatory Visit: Payer: Self-pay | Admitting: *Deleted

## 2017-10-03 NOTE — Patient Outreach (Signed)
[facility] [facility]) Care Management  10/03/2017  Cheryl Vargas 01-26-1935 [id number]  Referral via [place] ; member discharged from [place] 09/29/2017:  Per chart Hx: Hospital Admission 01/07-01/06/2018 Dx Acute on chronic systolic & diasystolic HF, Paroxymal atrial fibrillation, severe mitral regurgitation, CHF Went to SNF above discharge from hospital 01/10  Telephone call attempt x 1; no answer to call nd unable to leave message.   Plan: Will follow up.  Sherrin Daisy, RN BSN [place] Management Coordinator [facility] Care Management  [phone]

## 2017-10-04 ENCOUNTER — Other Ambulatory Visit: Payer: Self-pay | Admitting: *Deleted

## 2017-10-04 NOTE — Patient Outreach (Signed)
[facility] [facility] [facility]) Care Management  10/04/2017  Jream Broyles Maclaren March 11, 1935 [id number]  Referral via [place] ; member discharged from [place] 09/29/2017:  Per chart Hx: Hospital Admission 01/07-01/06/2018 Dx Acute on chronic systolic & diasystolic HF, Paroxymal atrial fibrillation, severe mitral regurgitation, CHF Went to SNF above discharge from hospital 01/10  Telephone call x 3; left message on voice mail requesting call back.  Plan: Will follow up.  Sherrin Daisy, RN BSN [place] Management Coordinator [facility] Care Management  [phone]

## 2017-10-05 ENCOUNTER — Other Ambulatory Visit: Payer: Self-pay | Admitting: *Deleted

## 2017-10-05 NOTE — Patient Outreach (Addendum)
[place] [facility]) Care Management  10/05/2017  Cheryl Vargas 18-Jun-1935 [id number]   Referral via [place] ; member discharged from [place] 09/29/2017:  Per chart Hx: Hospital Admission 01/07-01/06/2018 Dx Acute on chronic systolic & diasystolic HF, Paroxymal atrial fibrillation, severe mitral regurgitation, CHF Went to SNF above discharge from hospital 01/10  Telephone call to patient who answered call.  Advised of reason for call & [facility] services. HIPPA verification received from patient.   Patient agreed to answer questions to transition of care assessment questions.  States reason for hospital stay was fluid gain of 6 lbs. Has hx heart failure. Cardiac arrest, ICD implant. States from hospital went to Skilled facility & now back home with [place] services in place.   Voices she is weighing daily and knows when to notify MD of symptoms such as weight gain, swelling of ankles, feet or other areas.  Current weight 140 lbs, height-5'5". States no swelling today- States she was attending heart failure clinic before hospital admission. Patient voices that she manages own medications & takes consistently as ordered by MD.   Patient voices that she can't talk any longer but agrees to [facility] services of Transition of care program. Agrees to call back next week with set appointment to finish health assessments.   Plan; Will follow up next week.   Continue with health assessment Develop care plan.   Sherrin Daisy, RN BSN CCM Care Management Coordinator [facility] Care Management  [phone]   .

## 2017-10-10 ENCOUNTER — Ambulatory Visit: Payer: Self-pay | Admitting: *Deleted

## 2017-10-11 ENCOUNTER — Other Ambulatory Visit: Payer: Self-pay | Admitting: *Deleted

## 2017-10-11 ENCOUNTER — Encounter: Payer: Self-pay | Admitting: Nurse Practitioner

## 2017-10-11 NOTE — Patient Outreach (Signed)
[facility] [facility]) Care Management  10/11/2017  Inika [place] Mochizuki 07/18/1935 [id number]  Kassondra Geil [place] August 12, 1935 [id number]  Transition of care #2 call attempt:  Referral via [place] ; member discharged from [place] 09/29/2017:  Per chart Hx: Hospital Admission 01/07-01/06/2018 Dx Acute on chronic systolic & diasystolic HF, Paroxymal atrial fibrillation, severe mitral regurgitation, CHF Went to SNF above discharge from hospital 01/10  Telephone call attempt to patient; left message requesting return call.  Plan: Will follow up.  Sherrin Daisy, RN BSN [place] Management Coordinator [facility] Care Management  [phone]

## 2017-10-12 ENCOUNTER — Other Ambulatory Visit: Payer: Self-pay | Admitting: *Deleted

## 2017-10-12 NOTE — Patient Outreach (Signed)
[facility]) Care Management  10/12/2017  Cheryl Vargas [birth year] [id number]   Transition call #4  Referral via [place] ; member discharged from [place] 09/29/2017:  Per chart Hx: Hospital Admission 01/07-01/06/2018 Dx Acute on chronic systolic & diasystolic HF, Paroxymal atrial fibrillation, severe mitral regurgitation, CHF Went to SNF above discharge from hospital 01/10  Follow up telephone call attempt x2; patient voices she is very busy now and would call back; disconnected call.   Plan: Will follow up.  Sherrin Daisy, RN BSN [place] Management Coordinator [facility] Care Management  [phone]

## 2017-10-15 ENCOUNTER — Other Ambulatory Visit: Payer: Self-pay | Admitting: *Deleted

## 2017-10-15 ENCOUNTER — Encounter: Payer: Self-pay | Admitting: *Deleted

## 2017-10-15 NOTE — Patient Outreach (Signed)
[facility]) Care Management  10/15/2017  Cheryl Vargas 1935/03/03 [id number]  Referral via [place] ; member discharged from [place] 09/29/2017:  Per chart Hx: Hospital Admission 01/07-01/06/2018 Dx Acute on chronic systolic & diasystolic HF, Paroxymal atrial fibrillation, severe mitral regurgitation, CHF Went to SNF above discharge from hospital 01/1.  Follow up transition of care call x 3 attempt; person who answered call advised that person was not home & hung up. Unable to leave message.    Plan: Will send out reach letter. Close out in 10 business days if no contact from patient.   Sherrin Daisy, RN BSN [place] Management Coordinator [facility] Care Management  [phone]

## 2017-10-19 ENCOUNTER — Ambulatory Visit (INDEPENDENT_AMBULATORY_CARE_PROVIDER_SITE_OTHER): Payer: PPO | Admitting: Nurse Practitioner

## 2017-10-19 ENCOUNTER — Encounter: Payer: Self-pay | Admitting: Nurse Practitioner

## 2017-10-19 VITALS — BP 110/78 | HR 81 | Ht 65.0 in | Wt 142.0 lb

## 2017-10-19 DIAGNOSIS — I5022 Chronic systolic (congestive) heart failure: Secondary | ICD-10-CM

## 2017-10-19 DIAGNOSIS — I472 Ventricular tachycardia, unspecified: Secondary | ICD-10-CM

## 2017-10-19 DIAGNOSIS — I482 Chronic atrial fibrillation: Secondary | ICD-10-CM | POA: Diagnosis not present

## 2017-10-19 DIAGNOSIS — I442 Atrioventricular block, complete: Secondary | ICD-10-CM

## 2017-10-19 DIAGNOSIS — I4821 Permanent atrial fibrillation: Secondary | ICD-10-CM

## 2017-10-19 LAB — CUP PACEART INCLINIC DEVICE CHECK
Date Time Interrogation Session: 20190208104458
Implantable Lead Implant Date: 20181228
Implantable Lead Implant Date: 20181228
Implantable Lead Implant Date: 20181228
Implantable Lead Location: 753858
Implantable Lead Location: 753859
Implantable Lead Location: 753860
Implantable Lead Model: 4598
Implantable Lead Model: 5076
Implantable Pulse Generator Implant Date: 20181228

## 2017-10-19 NOTE — Progress Notes (Signed)
Electrophysiology Office Note Date: 10/19/2017  ID:  Cheryl Vargas, DOB 26-Aug-1935, MRN [id number]  PCP: Lavone Orn, MD Primary Cardiologist: Aundra Dubin Electrophysiologist: [place]  CC: CRT and VT follow up  Cheryl Vargas is a 83 y.o. female seen today for Dr Rayann Heman.  She presents today for routine electrophysiology followup.  Since last being seen in our clinic, the patient reports doing very well. She is much improved following CRTD implant and AVN ablation. She went to rehab for a short period post hospitalization.  Shortness of breath is improved. She will occasionally take extra 10mg  of Lasix.  She denies chest pain, palpitations, dyspnea, PND, orthopnea, nausea, vomiting, dizziness, syncope,  weight gain, or early satiety.  She has not had ICD shocks.   Device History: MDT CRTD implanted 2018 for complete heart block and VT History of appropriate therapy: No History of AAD therapy: Yes - amio and mexiletine caused nausea   Past Medical History:  Diagnosis Date  . Arthritis   . Chronic combined systolic (congestive) and diastolic (congestive) heart failure ([place])   . Mitral regurgitation   . Non-ischemic cardiomyopathy ([place])   . Persistent atrial fibrillation ([place])   . Ventricular tachycardia [facility])    Past Surgical History:  Procedure Laterality Date  . AV NODE ABLATION N/A 09/07/2017   Procedure: AV NODE ABLATION;  Surgeon: [name], MD;  Location: [place] CV LAB;  Service: Cardiovascular;  Laterality: N/A;  . BIV ICD INSERTION CRT-D N/A 09/07/2017   Procedure: BIV ICD INSERTION CRT-D;  Surgeon: [name], MD;  Location: [place] CV LAB;  Service: Cardiovascular;  Laterality: N/A;  . BUNIONECTOMY    . CARDIAC CATHETERIZATION     in 2004 at [facility]. "Insignificant blockage" per patient  . CARDIAC CATHETERIZATION N/A 04/25/2016   Procedure: Left Heart Cath and Coronary Angiography;  Surgeon: Nelva Bush, MD;  Location: [place] CV LAB;  Service:  Cardiovascular;  Laterality: N/A;  . CARDIOVERSION N/A 06/29/2017   Procedure: CARDIOVERSION;  Surgeon: Larey Dresser, MD;  Location: [facility] ENDOSCOPY;  Service: Cardiovascular;  Laterality: N/A;  . CARDIOVERSION N/A 08/28/2017   Procedure: CARDIOVERSION;  Surgeon: Larey Dresser, MD;  Location: [facility] ENDOSCOPY;  Service: Cardiovascular;  Laterality: N/A;  . SHOULDER SURGERY     closed reduction  . TEE WITHOUT CARDIOVERSION N/A 06/29/2017   Procedure: TRANSESOPHAGEAL ECHOCARDIOGRAM (TEE);  Surgeon: Larey Dresser, MD;  Location: [facility] ENDOSCOPY;  Service: Cardiovascular;  Laterality: N/A;  . TONSILLECTOMY    . TOTAL HIP ARTHROPLASTY Left 03/18/2013   Procedure: TOTAL HIP ARTHROPLASTY ANTERIOR APPROACH;  Surgeon: Mauri Pole, MD;  Location: [facility] ORS;  Service: Orthopedics;  Laterality: Left;  . TUBAL LIGATION      Current Outpatient Medications  Medication Sig Dispense Refill  . acetaminophen (TYLENOL) 500 MG tablet Take 500 mg by mouth every 6 (six) hours as needed for moderate pain.    . bisoprolol (ZEBETA) 10 MG tablet Take 1 tablet (10 mg total) by mouth daily. 30 tablet 0  . calcium carbonate (TUMS - DOSED IN MG ELEMENTAL CALCIUM) 500 MG chewable tablet Chew 1-2 tablets (200-400 mg of elemental calcium total) by mouth as needed for indigestion or heartburn. 30 tablet 0  . docusate sodium (COLACE) 100 MG capsule Take 100 mg by mouth daily as needed for mild constipation.    . fluticasone (FLONASE) 50 MCG/ACT nasal spray Place 1 spray into both nostrils daily as needed for allergies or rhinitis.    . furosemide (  LASIX) 20 MG tablet Take 1 tablet (20 mg total) by mouth daily. Take an extra tablet daily as needed for weight gain of >3 pounds    . naphazoline-pheniramine (NAPHCON-A) 0.025-0.3 % ophthalmic solution Place 1 drop into both eyes daily as needed (dry eyes).     . pantoprazole (PROTONIX) 40 MG tablet Take 1 tablet (40 mg total) by mouth daily. 30 tablet 0  . Rivaroxaban (XARELTO) 15 MG  TABS tablet Take 1 tablet (15 mg total) by mouth daily with supper. RESUME ON 2 JAN 30 tablet 11  . zolpidem (AMBIEN) 5 MG tablet Take 1 tablet (5 mg total) by mouth at bedtime. 5 tablet 0   No current facility-administered medications for this visit.     Allergies:   Sotalol; Alendronate sodium; Amiodarone; Ciprofloxacin; Coreg [carvedilol]; and Metoprolol   Social History: Social History   Socioeconomic History  . Marital status: Widowed    Spouse name: Not on file  . Number of children: Not on file  . Years of education: Not on file  . Highest education level: Not on file  Social Needs  . Financial resource strain: Not on file  . Food insecurity - worry: Not on file  . Food insecurity - inability: Not on file  . Transportation needs - medical: Not on file  . Transportation needs - non-medical: Not on file  Occupational History  . Not on file  Tobacco Use  . Smoking status: Never Smoker  . Smokeless tobacco: Never Used  Substance and Sexual Activity  . Alcohol use: Yes    Alcohol/week: 1.8 oz    Types: 3 Glasses of wine per week    Comment: per week  . Drug use: No  . Sexual activity: Not on file  Other Topics Concern  . Not on file  Social History Narrative  . Not on file    Family History: Family History  Problem Relation Age of Onset  . Lung cancer Mother   . Heart attack Father     Review of Systems: All other systems reviewed and are otherwise negative except as noted above.   Physical Exam: VS:  BP 110/78   Pulse 81   Ht 5\' 5"  (1.651 m)   Wt 142 lb (64.4 kg)   BMI 23.63 kg/m  , BMI Body mass index is 23.63 kg/m.  GEN- The patient is well appearing, alert and oriented x 3 today.   HEENT: normocephalic, atraumatic; sclera clear, conjunctiva pink; hearing intact; oropharynx clear; neck supple  Lungs- Clear to ausculation bilaterally, normal work of breathing.  No wheezes, rales, rhonchi Heart- Regular rate and rhythm (paced) GI- soft, non-tender,  non-distended, bowel sounds present Extremities- no clubbing, cyanosis, or edema  MS- no significant deformity or atrophy Skin- warm and dry, no rash or lesion; ICD pocket well healed Psych- euthymic mood, full affect Neuro- strength and sensation are intact  ICD interrogation- reviewed in detail today,  See PACEART report  EKG:  EKG is ordered today. The ekg ordered today shows AF with CRT pacing  Recent Labs: 09/17/2017: B Natriuretic Peptide 1,492.6 09/18/2017: Hemoglobin 13.3; Magnesium 1.9; Platelets 231 09/20/2017: ALT 47; BUN 21; Creatinine, Ser 0.88; Potassium 3.0; Sodium 138   Wt Readings from Last 3 Encounters:  10/19/17 142 lb (64.4 kg)  09/20/17 142 lb 10.2 oz (64.7 kg)  09/08/17 160 lb 7.9 oz (72.8 kg)     Other studies Reviewed: Additional studies/ records that were reviewed today include: hospital records  Assessment and Plan:  1.  Chronic systolic dysfunction euvolemic today Normal ICD function Effective CRT pacing 90.9% 2/2 PVC's See Pace Art report Rate decreased to 90 today Enrolled in ICM clinic today Follow up with AHF clinic next week as scheduled   2.  Complete heart block Normal device function See above  3.  Ventricular tachycardia No recent recurrence No driving x6 months from December 2018 Keep K>3.9, Mg>1.8 BMET recently stable with PCP  4.  Permanent AF S/p AVN ablation  Continue Xarelto long term for CHADS2VASC of 4   Current medicines are reviewed at length with the patient today.   The patient does not have concerns regarding her medicines.  The following changes were made today:  Stop protonix  Labs/ tests ordered today include: echo 6/19 (6 months post CRT) No orders of the defined types were placed in this encounter.    Disposition:   Follow up with Dr Rayann Heman as scheduled      Signed, Michelle Nasuti, NP 10/19/2017 10:26 AM  [place] [address] [phone]  (office) [phone] (fax)

## 2017-10-19 NOTE — Patient Instructions (Addendum)
Medication Instructions:   STOP  TAKING PROTONIX   If you need a refill on your cardiac medications before your next appointment, please call your pharmacy.   Labwork: NONE ORDERED  TODAY    Testing/Procedures:  IN June  Your physician has requested that you have an echocardiogram. Echocardiography is a painless test that uses sound waves to create images of your heart. It provides your doctor with information about the size and shape of your heart and how well your heart's chambers and valves are working. This procedure takes approximately one hour. There are no restrictions for this procedure.     Follow-Up:   KEEP  APPT AS SCHEDULED.     Any Other Special Instructions Will Be Listed Below (If Applicable).

## 2017-10-22 ENCOUNTER — Ambulatory Visit: Payer: PPO | Admitting: Internal Medicine

## 2017-10-24 ENCOUNTER — Ambulatory Visit (HOSPITAL_COMMUNITY)
Admission: RE | Admit: 2017-10-24 | Discharge: 2017-10-24 | Disposition: A | Discharge status: Home / Self Care | Payer: PPO | Source: Ambulatory Visit | Attending: Cardiology | Admitting: Cardiology

## 2017-10-24 ENCOUNTER — Encounter (HOSPITAL_COMMUNITY): Payer: Self-pay | Admitting: Cardiology

## 2017-10-24 VITALS — BP 115/64 | HR 78 | Wt 144.8 lb

## 2017-10-24 DIAGNOSIS — I480 Paroxysmal atrial fibrillation: Secondary | ICD-10-CM | POA: Diagnosis not present

## 2017-10-24 DIAGNOSIS — I429 Cardiomyopathy, unspecified: Secondary | ICD-10-CM | POA: Insufficient documentation

## 2017-10-24 DIAGNOSIS — Z79899 Other long term (current) drug therapy: Secondary | ICD-10-CM | POA: Insufficient documentation

## 2017-10-24 DIAGNOSIS — E785 Hyperlipidemia, unspecified: Secondary | ICD-10-CM | POA: Insufficient documentation

## 2017-10-24 DIAGNOSIS — I472 Ventricular tachycardia, unspecified: Secondary | ICD-10-CM

## 2017-10-24 DIAGNOSIS — I4821 Permanent atrial fibrillation: Secondary | ICD-10-CM

## 2017-10-24 DIAGNOSIS — I482 Chronic atrial fibrillation: Secondary | ICD-10-CM | POA: Diagnosis not present

## 2017-10-24 DIAGNOSIS — I493 Ventricular premature depolarization: Secondary | ICD-10-CM | POA: Diagnosis not present

## 2017-10-24 DIAGNOSIS — I110 Hypertensive heart disease with heart failure: Secondary | ICD-10-CM | POA: Diagnosis not present

## 2017-10-24 DIAGNOSIS — I5022 Chronic systolic (congestive) heart failure: Secondary | ICD-10-CM | POA: Insufficient documentation

## 2017-10-24 DIAGNOSIS — Z7902 Long term (current) use of antithrombotics/antiplatelets: Secondary | ICD-10-CM | POA: Diagnosis not present

## 2017-10-24 DIAGNOSIS — I5042 Chronic combined systolic (congestive) and diastolic (congestive) heart failure: Secondary | ICD-10-CM

## 2017-10-24 LAB — BASIC METABOLIC PANEL
Anion gap: 11 (ref 5–15)
BUN: 17 mg/dL (ref 6–20)
CO2: 25 mmol/L (ref 22–32)
Calcium: 9 mg/dL (ref 8.9–10.3)
Chloride: 103 mmol/L (ref 101–111)
Creatinine, Ser: 0.95 mg/dL (ref 0.44–1.00)
GFR calc Af Amer: >60 mL/min (ref >60)
GFR calc non Af Amer: 55 mL/min — ABNORMAL LOW (ref >60)
Glucose, Bld: 107 mg/dL — ABNORMAL HIGH (ref 65–99)
Potassium: 3.7 mmol/L (ref 3.5–5.1)
Sodium: 139 mmol/L (ref 135–145)

## 2017-10-24 MED ORDER — POTASSIUM CHLORIDE CRYS ER 20 MEQ PO TBCR: 20.0000 meq | EXTENDED_RELEASE_TABLET | Freq: Every day | ORAL | 3 refills | Status: DC

## 2017-10-24 MED ORDER — LOSARTAN POTASSIUM 25 MG PO TABS: 25.0000 mg | ORAL_TABLET | Freq: Every day | ORAL | 3 refills | Status: DC

## 2017-10-24 MED ORDER — FUROSEMIDE 20 MG PO TABS
40.0000 mg | ORAL_TABLET | Freq: Every day | ORAL | 3 refills | Status: DC
Start: 2017-10-24 — End: 2017-10-24

## 2017-10-24 MED ORDER — LOSARTAN POTASSIUM 25 MG PO TABS: 12.5000 mg | ORAL_TABLET | Freq: Every day | ORAL | 3 refills | Status: DC

## 2017-10-24 MED ORDER — LOSARTAN POTASSIUM 25 MG PO TABS: 12.5000 mg | ORAL_TABLET | Freq: Every day | ORAL | 0 refills | Status: DC

## 2017-10-24 MED ORDER — FUROSEMIDE 40 MG PO TABS: 40.0000 mg | ORAL_TABLET | Freq: Every day | ORAL | 3 refills | Status: DC

## 2017-10-24 NOTE — Patient Instructions (Addendum)
Start Potassium 20 meq (1 tab) daily  Start Losartan 12.5 mg (1/2 tab) daily  Increase Furosemide 40 mg (2 tabs) daily  You have been referred to Cardiac Rehab (they will call you)   Labs drawn today (if we do not call you, then your lab work was stable)   Your physician recommends that you return for lab work in: 10 days  Your physician recommends that you schedule a follow-up appointment in: 1 month with Dr. Aundra Dubin

## 2017-10-25 ENCOUNTER — Other Ambulatory Visit (HOSPITAL_COMMUNITY): Payer: Self-pay | Admitting: *Deleted

## 2017-10-25 MED ORDER — BISOPROLOL FUMARATE 10 MG PO TABS: 10.0000 mg | ORAL_TABLET | Freq: Every day | ORAL | 3 refills | Status: DC

## 2017-10-25 NOTE — Progress Notes (Signed)
AD[facility]ANCED [facility] CLINIC  Primary Care: Dr Laurann [place] HF Cardiology: Dr. 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 Cardiomyopathy dates back to at least 2014 based on echoes (EF 30-35% in 2014).   Admitted 8/17 for fatigue and dyspnea.  She was seen by electrophysiology on 04/17/16, and was started on po Amiodarone as she reported increased palpitations and fatigue with frequent PVCs. Her BNP was elevated at 1592.6 and chest x ray was consistent with CHF.  She was diuresed.  With her complaints of new onset fatigue and dyspnea combined with her known LV dysfunction, it was felt that she would benefit from an ischemic evaluation by cath. She underwent left heart cath with normal cors. Amiodarone was later stopped due to nausea.  Discharge weight 143 pounds. Echo (8/17) with EF 40-45%, inferoseptal akinesis. Cardiac MRI 10/17 with EF 36%, LGE mid wall pattern in the basal to mid septum and inferior wall. Holter monitor in 9/17 with runs of atrial fibrillation/RVR.  She has had trouble tolerating cardiac meds due to hypotension/lightheadedness.    She has had difficulty with symptomatic atrial fibrillation. She developed symptomatic atrial fibrillation with RVR again in 10/18, difficult to control rate.  She had TEE-guided DCCV with resumption of NSR. On TEE in rapid atrial fibrillation, EF 20-25%.  On TTE after DCCV, EF back to 40-45% range.  MR reported as moderate to severe but I reviewed echo and think it appears more in the moderate range.    She had recurrent atrial fibrillation after 10/18 DCCV. She was admitted in 12/18 with atrial fibrillation and RVR, she also had a run of WCT thought to be VT.   The atrial fibrillation was very difficult to control.  She was thought to be a poor candidate for atrial fibrillation ablation.  She finally had AV nodal ablation with  Medtronic CRT-D device in 12/18. She was then admitted briefly with CHF in 1/19.  Last echo in 12/18 showed EF 25-30%, moderate MR, moderate TR, severe LAE.   She returns for followup of atrial fibrillation and CHF today.  She feels better than prior to AV nodal ablation. She is still short of breath after walking about 100 feet.  No lightheadedness.  No orthopnea/PND. Not very active.   Labs (8/17): K 4.2, creatinine 0.85, BNP 1593, HCT 43.2, TSH mild elevated 5.3, free T4 normal Labs (9/17): K 4.1, creatinine 0.98 => 0.92, BNP 258, SPEP negative Labs (2/18): K 4, creatinine 0.83, HCT 40.8 Labs (10/18): K 4.2, creatinine 0.94, hgb 14.9 Labs (1/19): K 4.9, creatinine 0.72  Medtronic device interrogation: fluid index nearing threshold, decreased impedance, 91% BiV pacing (low number due to PVCs).   1. Chronic systolic CHF:  Nonischemic cardiomyopathy.   - Echo (2014): EF 30-35%. - Echo (2/17): EF 40-45% - Echo (8/17): EF 40-45%, mid to apical inferoseptal akinesis - Coronary angiography (8/17): No significant CAD.  - Cardiac MRI (10/17): EF 36%, moderate MR, normal RV size and systolic function, mid-wall LGE in the basal to mid septum and inferior wall.  - TEE (10/18): EF 20-25% but in rapid afib with HR up to 150 bpm, mild MR.  - Echo (10/18): EF 40-45%, diffuse hypokinesis, moderate LAE, reportedly moderate-severe MR (looks moderate on my review).  - Echo (12/18): EF 25-30%, moderate MR, moderate TR, severe LAE - Medtronic CRT-D device s/p AV nodal ablation.  2.  Atrial fibrillation: Paroxysmal.  Diagnosed 2/17.  Unable to tolerate amiodarone.  - Holter (9/17): atrial fibrillation with RVR runs, 5% PVCs.  - AV nodal ablation with BiV pacing in 12/18.  3. Hyperlipidemia 4. PVCs: frequent.  5. Long QT interval  ROS: All systems reviewed and negative except as per HPI.   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  Occasional ETOH, never heavy.    Current Outpatient Medications  Medication Sig Dispense Refill  . acetaminophen (TYLENOL) 500 MG tablet Take 500 mg by mouth every 6 (six) hours as needed for moderate pain.    . calcium carbonate (TUMS - DOSED IN MG ELEMENTAL CALCIUM) 500 MG chewable tablet Chew 1-2 tablets (200-400 mg of elemental calcium total) by mouth as needed for indigestion or heartburn. 30 tablet 0  . docusate sodium (COLACE) 100 MG capsule Take 100 mg by mouth daily as needed for mild constipation.    . fluticasone (FLONASE) 50 MCG/ACT nasal spray Place 1 spray into both nostrils daily as needed for allergies or rhinitis.    . furosemide (LASIX) 40 MG tablet Take 1 tablet (40 mg total) by mouth daily. Take an extra 1/2 tab tablet daily as needed for weight gain of >3 pounds 120 tablet 3  . naphazoline-pheniramine (NAPHCON-A) 0.025-0.3 % ophthalmic solution Place 1 drop into both eyes daily as needed (dry eyes).     . Rivaroxaban (XARELTO) 15 MG TABS tablet Take 1 tablet (15 mg total) by mouth daily with supper. RESUME ON 2 JAN 30 tablet 11  . zolpidem (AMBIEN) 5 MG tablet Take 1 tablet (5 mg total) by mouth at bedtime. 5 tablet 0  . bisoprolol (ZEBETA) 10 MG tablet Take 1 tablet (10 mg total) by mouth daily. 90 tablet 3  . losartan (COZAAR) 25 MG tablet Take 0.5 tablets (12.5 mg total) by mouth at bedtime. 45 tablet 0  . potassium chloride SA (K-DUR,KLOR-CON) 20 MEQ tablet Take 1 tablet (20 mEq total) by mouth daily. 90 tablet 3   No current facility-administered medications for this encounter.     Vitals:   10/24/17 1103  BP: 115/64  Pulse: 78  SpO2: 99%  Weight: 144 lb 12.8 oz (65.7 kg)    PHYSICAL EXAM: General: NAD Neck: JVP 8-9 cm, no thyromegaly or thyroid nodule.  Lungs: Clear to auscultation bilaterally with normal respiratory effort. CV: Nondisplaced PMI.  Heart regular S1/S2, no S3/S4, no murmur.  1+ bilateral ankle edema.  No carotid bruit.  Normal pedal pulses.    Abdomen: Soft, nontender, no hepatosplenomegaly, no distention.  Skin: Intact without lesions or rashes.  Neurologic: Alert and oriented x 3.  Psych: Normal affect. Extremities: No clubbing or cyanosis.  HEENT: Normal.   ASSESSMENT & PLAN:  1. Chronic systolic CHF: Nonischemic cardiomyopathy.  EF 30-35% in 2014, most recent echo in 12/18 with EF 25-30% whilce in atrial fibrillation prior to AV nodal ablation and CRT-D.  Admitted in 8/17 with CHF exacerbation.  Angiography showed no significant coronary disease.  Etiology of CMP is uncertain.  Possible viral myocarditis => cardiac MRI in 10/17 showed EF 36% and LGE pattern that could be consistent with prior myocarditis.  Has not appeared to have enough PVCs to cause PVC-mediated cardiomyopathy.  She has Medtronic CRT-D s/p AV nodal ablation.  She currently has NYHA class II-III symptoms with volume overload by exam and Optivol.  91% BiV pacing due to PVCs.  - Increase  Lasix to 40 mg daily and add KCl 20 daily. BMET today and again in 10 days.  - Continue bisoprolol 10 mg daily.   - Add back losartan 12.5 mg to be taken at bedtime.    - I will refer to cardiac rehab.   - I will get echo 3 months post-BiV pacing in 3/19.  2. PVCs: H/o frequent PVCs, but there have not appeared to have been enough to trigger CMP. She is on bisoprolol.    3. Atrial fibrillation: Now permanent and s/p AV nodal ablation with BiV pacing. - Continue Xarelto.  I have reviewed her dose (15 mg daily) with our pharmacist and it is appropriate for her creatinine clearance.  4. H/o VT: No driving until 7/61 (6 months).   Followup in 1 month   [name] 10/25/2017

## 2017-10-26 ENCOUNTER — Telehealth (HOSPITAL_COMMUNITY): Payer: Self-pay

## 2017-10-26 NOTE — Telephone Encounter (Signed)
Patients insurance is active and benefits verified through [place] - $15.00 co-pay, no deductible, out of pocket amount of $3,400/$1,234.57 has been met, no co-insurance, and no pre-authorization is required. Spoke with [employer] - Reference [id number]

## 2017-11-02 ENCOUNTER — Other Ambulatory Visit: Payer: Self-pay | Admitting: *Deleted

## 2017-11-02 NOTE — Patient Outreach (Signed)
[place] [facility]) Care Management  11/02/2017  Cheryl Vargas 09/28/1934 [id number]  Unsuccessful call attempts x 3; no response to outreach letter.  Plan: Send MD closure. Send to care management assistant for case closure.   Sherrin Daisy, RN BSN [place] Management Coordinator [facility] Care Management  [phone]

## 2017-11-03 NOTE — Patient Outreach (Signed)
[place] [facility]) Care Management  11/03/2017  Cheryl Vargas 1934/11/16 [id number]  Unsuccessful call attempts; no response to outreach letter.  Plan: Send to care management assistant for case closure.  Sherrin Daisy, RN BSN [place] Management Coordinator [facility] Care Management  [phone]

## 2017-11-05 ENCOUNTER — Telehealth (HOSPITAL_COMMUNITY): Payer: Self-pay | Admitting: *Deleted

## 2017-11-05 NOTE — Telephone Encounter (Signed)
Patient left VM complaining of low bp 92/58, resting heart rate 98-103, and just not "feeling well". I called patient back to get more information she did not answer I left VM requesting a call back.

## 2017-11-06 ENCOUNTER — Ambulatory Visit (HOSPITAL_COMMUNITY)
Admission: RE | Admit: 2017-11-06 | Discharge: 2017-11-06 | Disposition: A | Discharge status: Home / Self Care | Payer: PPO | Source: Ambulatory Visit | Attending: Cardiology | Admitting: Cardiology

## 2017-11-06 DIAGNOSIS — I5042 Chronic combined systolic (congestive) and diastolic (congestive) heart failure: Secondary | ICD-10-CM | POA: Diagnosis not present

## 2017-11-06 LAB — BASIC METABOLIC PANEL
Anion gap: 9 (ref 5–15)
BUN: 28 mg/dL — ABNORMAL HIGH (ref 6–20)
CO2: 25 mmol/L (ref 22–32)
Calcium: 9.2 mg/dL (ref 8.9–10.3)
Chloride: 104 mmol/L (ref 101–111)
Creatinine, Ser: 0.97 mg/dL (ref 0.44–1.00)
GFR calc Af Amer: >60 mL/min (ref >60)
GFR calc non Af Amer: 54 mL/min — ABNORMAL LOW (ref >60)
Glucose, Bld: 99 mg/dL (ref 65–99)
Potassium: 4.1 mmol/L (ref 3.5–5.1)
Sodium: 138 mmol/L (ref 135–145)

## 2017-11-07 ENCOUNTER — Telehealth: Payer: Self-pay | Admitting: Internal Medicine

## 2017-11-07 ENCOUNTER — Telehealth (HOSPITAL_COMMUNITY): Payer: Self-pay

## 2017-11-07 NOTE — Telephone Encounter (Signed)
Called to speak with patient in regards to Cardiac Rehab - Patient is interested in the program although patient is not driving right now. Will not have transportation until June 2019. Patient stated she would give [place] a call once she figures out transportation. Will follow up in a couple of weeks if no phone call from patient.

## 2017-11-07 NOTE — Telephone Encounter (Signed)
New message ° °Pt verbalized that she is returning call for RN °

## 2017-11-07 NOTE — Telephone Encounter (Signed)
Will route to CHF clinic.  Looks like they reached out to pt about BP issue a couple of days ago.

## 2017-11-08 ENCOUNTER — Encounter (HOSPITAL_COMMUNITY): Payer: Self-pay

## 2017-11-08 ENCOUNTER — Ambulatory Visit (HOSPITAL_COMMUNITY)
Admission: RE | Admit: 2017-11-08 | Discharge: 2017-11-08 | Disposition: A | Discharge status: Home / Self Care | Payer: PPO | Source: Ambulatory Visit | Attending: Internal Medicine | Admitting: Internal Medicine

## 2017-11-08 DIAGNOSIS — I5022 Chronic systolic (congestive) heart failure: Secondary | ICD-10-CM | POA: Diagnosis not present

## 2017-11-08 DIAGNOSIS — Z4502 Encounter for adjustment and management of automatic implantable cardiac defibrillator: Secondary | ICD-10-CM | POA: Insufficient documentation

## 2017-11-08 NOTE — Telephone Encounter (Signed)
Called patient again left message. Cheryl Vargas is on triage today and will try to contact patient again today.

## 2017-11-08 NOTE — Telephone Encounter (Signed)
I called and spoke with patient and she will go back to taking losartan 12.5 mg and will call us if her BP drops below 90 systolic. She does not feel dizzy.  She is able to come in this afternoon for them to interrogate her device, appt scheduled.

## 2017-11-08 NOTE — Telephone Encounter (Signed)
Called and spoke with patient regarding BP.  She stated she has been running 92/64, 92/59, and today it was 100/52.  She was started on losartan 12.5 mg but the past couple days she has only been taking 6.25 mg (1/4 Tablet) since her BP has been running low.  However she is more concerned about her HR, at rest it ranges from 95-100.    I will forward to Dr. Aundra Dubin to review and will call patient back with his response.

## 2017-11-08 NOTE — Patient Instructions (Signed)
Device Interrogated.  No medications at this time, continue taking Losartan 12.5 mg at bedtime.

## 2017-11-08 NOTE — Telephone Encounter (Signed)
Take 12.5 mg daily losartan but take at night, SBP 90s ok for her as long as not dizzy.  Would have her get her device interrogated.  She can come in for nursing visit and we can do in the office.

## 2017-11-20 ENCOUNTER — Other Ambulatory Visit: Payer: Self-pay | Admitting: Internal Medicine

## 2017-11-26 ENCOUNTER — Other Ambulatory Visit (HOSPITAL_COMMUNITY): Payer: Self-pay | Admitting: Cardiology

## 2017-11-30 ENCOUNTER — Ambulatory Visit (HOSPITAL_COMMUNITY)
Admission: RE | Admit: 2017-11-30 | Discharge: 2017-11-30 | Disposition: A | Discharge status: Home / Self Care | Payer: PPO | Source: Ambulatory Visit | Attending: Cardiology | Admitting: Cardiology

## 2017-11-30 ENCOUNTER — Encounter (HOSPITAL_COMMUNITY): Payer: Self-pay | Admitting: Cardiology

## 2017-11-30 VITALS — BP 116/72 | HR 86 | Wt 144.0 lb

## 2017-11-30 DIAGNOSIS — I5022 Chronic systolic (congestive) heart failure: Secondary | ICD-10-CM | POA: Diagnosis not present

## 2017-11-30 DIAGNOSIS — I5042 Chronic combined systolic (congestive) and diastolic (congestive) heart failure: Secondary | ICD-10-CM | POA: Diagnosis not present

## 2017-11-30 DIAGNOSIS — Z79899 Other long term (current) drug therapy: Secondary | ICD-10-CM | POA: Diagnosis not present

## 2017-11-30 DIAGNOSIS — I480 Paroxysmal atrial fibrillation: Secondary | ICD-10-CM | POA: Insufficient documentation

## 2017-11-30 DIAGNOSIS — I110 Hypertensive heart disease with heart failure: Secondary | ICD-10-CM | POA: Diagnosis not present

## 2017-11-30 DIAGNOSIS — Z7901 Long term (current) use of anticoagulants: Secondary | ICD-10-CM | POA: Diagnosis not present

## 2017-11-30 DIAGNOSIS — I493 Ventricular premature depolarization: Secondary | ICD-10-CM | POA: Diagnosis not present

## 2017-11-30 DIAGNOSIS — E785 Hyperlipidemia, unspecified: Secondary | ICD-10-CM | POA: Diagnosis not present

## 2017-11-30 DIAGNOSIS — I429 Cardiomyopathy, unspecified: Secondary | ICD-10-CM | POA: Diagnosis not present

## 2017-11-30 DIAGNOSIS — Z7951 Long term (current) use of inhaled steroids: Secondary | ICD-10-CM | POA: Insufficient documentation

## 2017-11-30 LAB — BASIC METABOLIC PANEL
Anion gap: 10 (ref 5–15)
BUN: 29 mg/dL — ABNORMAL HIGH (ref 6–20)
CO2: 24 mmol/L (ref 22–32)
Calcium: 9.2 mg/dL (ref 8.9–10.3)
Chloride: 102 mmol/L (ref 101–111)
Creatinine, Ser: 1.04 mg/dL — ABNORMAL HIGH (ref 0.44–1.00)
GFR calc Af Amer: 57 mL/min — ABNORMAL LOW (ref >60)
GFR calc non Af Amer: 49 mL/min — ABNORMAL LOW (ref >60)
Glucose, Bld: 110 mg/dL — ABNORMAL HIGH (ref 65–99)
Potassium: 3.6 mmol/L (ref 3.5–5.1)
Sodium: 136 mmol/L (ref 135–145)

## 2017-11-30 MED ORDER — BISOPROLOL FUMARATE 5 MG PO TABS: 5.0000 mg | ORAL_TABLET | Freq: Two times a day (BID) | ORAL | 3 refills | Status: DC

## 2017-11-30 NOTE — Patient Instructions (Signed)
Labs today (will call for abnormal results, otherwise no news is good news)  Change bisoprolol to 5 mg Twice Daily  Follow up in June after Echocardiogram.

## 2017-12-02 NOTE — Progress Notes (Signed)
[facility]  Primary Care: Dr Laurann Montana HF Cardiology: Dr. Aundra Dubin  HPI: 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 Cardiomyopathy dates back to at least 2014 based on echoes (EF 30-35% in 2014).   Admitted 8/17 for fatigue and dyspnea.  She was seen by electrophysiology on 04/17/16, and was started on po Amiodarone as she reported increased palpitations and fatigue with frequent PVCs. Her BNP was elevated at 1592.6 and chest x ray was consistent with CHF.  She was diuresed.  With her complaints of new onset fatigue and dyspnea combined with her known LV dysfunction, it was felt that she would benefit from an ischemic evaluation by cath. She underwent left heart cath with normal cors. Amiodarone was later stopped due to nausea.  Discharge weight 143 pounds. Echo (8/17) with EF 40-45%, inferoseptal akinesis. Cardiac MRI 10/17 with EF 36%, LGE mid wall pattern in the basal to mid septum and inferior wall. Holter monitor in 9/17 with runs of atrial fibrillation/RVR.  She has had trouble tolerating cardiac meds due to hypotension/lightheadedness.    She has had difficulty with symptomatic atrial fibrillation. She developed symptomatic atrial fibrillation with RVR again in 10/18, difficult to control rate.  She had TEE-guided DCCV with resumption of NSR. On TEE in rapid atrial fibrillation, EF 20-25%.  On TTE after DCCV, EF back to 40-45% range.  MR reported as moderate to severe but I reviewed echo and think it appears more in the moderate range.    She had recurrent atrial fibrillation after 10/18 DCCV. She was admitted in 12/18 with atrial fibrillation and RVR, she also had a run of WCT thought to be VT.   The atrial fibrillation was very difficult to control.  She was thought to be a poor candidate for atrial fibrillation ablation.  She finally had AV nodal ablation with  Medtronic CRT-D device in 12/18. She was then admitted briefly with CHF in 1/19.  Last echo in 12/18 showed EF 25-30%, moderate MR, moderate TR, severe LAE.   She returns for followup of atrial fibrillation and CHF today.  At last appointment, she was volume overloaded and Lasix was increased.  She feels good today, breathing better overall.  No dyspnea with housework, no dyspnea walking in the grocery store.  BP stable, no lightheadedness.  No chest pain.  No orthopnea/PND.   Labs (8/17): K 4.2, creatinine 0.85, BNP 1593, HCT 43.2, TSH mild elevated 5.3, free T4 normal Labs (9/17): K 4.1, creatinine 0.98 => 0.92, BNP 258, SPEP negative Labs (2/18): K 4, creatinine 0.83, HCT 40.8 Labs (10/18): K 4.2, creatinine 0.94, hgb 14.9 Labs (1/19): K 4.9, creatinine 0.72 Labs (2/19): K 4.1, creatinine 0.97  Medtronic device interrogation: fluid index nearing threshold, decreased impedance, 91% BiV pacing (low number due to PVCs).   1. Chronic systolic CHF:  Nonischemic cardiomyopathy.   - Echo (2014): EF 30-35%. - Echo (2/17): EF 40-45% - Echo (8/17): EF 40-45%, mid to apical inferoseptal akinesis - Coronary angiography (8/17): No significant CAD.  - Cardiac MRI (10/17): EF 36%, moderate MR, normal RV size and systolic function, mid-wall LGE in the basal to mid septum and inferior wall.  - TEE (10/18): EF 20-25% but in rapid afib with HR up to 150 bpm, mild MR.  - Echo (10/18): EF 40-45%, diffuse hypokinesis, moderate LAE, reportedly moderate-severe MR (looks moderate on my review).  -  Echo (12/18): EF 25-30%, moderate MR, moderate TR, severe LAE - Medtronic CRT-D device s/p AV nodal ablation.  2. Atrial fibrillation: Paroxysmal.  Diagnosed 2/17.  Unable to tolerate amiodarone.  - Holter (9/17): atrial fibrillation with RVR runs, 5% PVCs.  - AV nodal ablation with BiV pacing in 12/18.  3. Hyperlipidemia 4. PVCs: frequent.  5. Long QT interval  ROS: All systems reviewed and negative except as per  HPI.   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 Occasional ETOH, never heavy.    Current Outpatient Medications  Medication Sig Dispense Refill  . acetaminophen (TYLENOL) 500 MG tablet Take 500 mg by mouth every 6 (six) hours as needed for moderate pain.    . bisoprolol (ZEBETA) 5 MG tablet Take 1 tablet (5 mg total) by mouth 2 (two) times daily. 180 tablet 3  . calcium carbonate (TUMS - DOSED IN MG ELEMENTAL CALCIUM) 500 MG chewable tablet Chew 1-2 tablets (200-400 mg of elemental calcium total) by mouth as needed for indigestion or heartburn. 30 tablet 0  . docusate sodium (COLACE) 100 MG capsule Take 100 mg by mouth daily as needed for mild constipation.    . fluticasone (FLONASE) 50 MCG/ACT nasal spray Place 1 spray into both nostrils daily as needed for allergies or rhinitis.    . furosemide (LASIX) 40 MG tablet Take 1 tablet (40 mg total) by mouth daily. Take an extra 1/2 tab tablet daily as needed for weight gain of >3 pounds 120 tablet 3  . losartan (COZAAR) 25 MG tablet Take ONE-HALF TABLET BY MOUTH at bedtime 45 tablet 0  . naphazoline-pheniramine (NAPHCON-A) 0.025-0.3 % ophthalmic solution Place 1 drop into both eyes daily as needed (dry eyes).     . potassium chloride SA (K-DUR,KLOR-CON) 20 MEQ tablet Take 1 tablet (20 mEq total) by mouth daily. 90 tablet 3  . Rivaroxaban (XARELTO) 15 MG TABS tablet Take 1 tablet (15 mg total) by mouth daily with supper. RESUME ON 2 JAN 30 tablet 11  . zolpidem (AMBIEN) 5 MG tablet Take 1 tablet (5 mg total) by mouth at bedtime. 5 tablet 0   No current facility-administered medications for this encounter.     Vitals:   11/30/17 1141  BP: 116/72  Pulse: 86  SpO2: 92%  Weight: 144 lb (65.3 kg)    PHYSICAL EXAM: General: NAD Neck: No JVD, no thyromegaly or thyroid nodule.  Lungs: Clear to auscultation bilaterally with normal respiratory effort. CV: Nondisplaced PMI.  Heart  regular S1/S2, no S3/S4, no murmur.  No peripheral edema.  No carotid bruit.  Normal pedal pulses.  Abdomen: Soft, nontender, no hepatosplenomegaly, no distention.  Skin: Intact without lesions or rashes.  Neurologic: Alert and oriented x 3.  Psych: Normal affect. Extremities: No clubbing or cyanosis.  HEENT: Normal.   ASSESSMENT & PLAN:  1. Chronic systolic CHF: Nonischemic cardiomyopathy.  EF 30-35% in 2014, most recent echo in 12/18 with EF 25-30% whilce in atrial fibrillation prior to AV nodal ablation and CRT-D.  Admitted in 8/17 with CHF exacerbation.  Angiography showed no significant coronary disease.  Etiology of CMP is uncertain.  Possible viral myocarditis => cardiac MRI in 10/17 showed EF 36% and LGE pattern that could be consistent with prior myocarditis.  Has not appeared to have enough PVCs to cause PVC-mediated cardiomyopathy.  She has Medtronic CRT-D s/p AV nodal ablation.  She does not BiV pace as much as ideal  due to PVCs.  She is not volume overloaded on exam today.  - Continue Lasix 40 mg daily, BMET today.   - Change bisoprolol to 5 mg bid so she will not feel as much effect during the day.    - Continue losartan 12.5 qhs.     - I will arrange for echo, 3 months post-ablation/pacing.  2. PVCs: H/o frequent PVCs, but there have not appeared to have been enough to trigger CMP. She is on bisoprolol.    3. Atrial fibrillation: Now permanent and s/p AV nodal ablation with BiV pacing. - Continue Xarelto.    4. H/o VT: No driving until 5/50 (6 months total).   Followup in 3 months   [name] 12/02/2017

## 2017-12-05 ENCOUNTER — Telehealth (HOSPITAL_COMMUNITY): Payer: Self-pay

## 2017-12-05 NOTE — Telephone Encounter (Signed)
Called to follow up with patient in regards to Cardiac Rehab - Patient stated she is going to stop by when she goes to her appt at the [facility]. Patient cannot drive until June. Will follow up if patient does not show.

## 2017-12-10 ENCOUNTER — Encounter: Payer: Self-pay | Admitting: Internal Medicine

## 2017-12-10 ENCOUNTER — Ambulatory Visit (INDEPENDENT_AMBULATORY_CARE_PROVIDER_SITE_OTHER): Payer: PPO | Admitting: Internal Medicine

## 2017-12-10 VITALS — BP 118/76 | HR 100 | Ht 65.0 in | Wt 142.0 lb

## 2017-12-10 DIAGNOSIS — I5022 Chronic systolic (congestive) heart failure: Secondary | ICD-10-CM | POA: Diagnosis not present

## 2017-12-10 DIAGNOSIS — I482 Chronic atrial fibrillation: Secondary | ICD-10-CM | POA: Diagnosis not present

## 2017-12-10 DIAGNOSIS — I472 Ventricular tachycardia, unspecified: Secondary | ICD-10-CM

## 2017-12-10 DIAGNOSIS — I469 Cardiac arrest, cause unspecified: Secondary | ICD-10-CM | POA: Diagnosis not present

## 2017-12-10 DIAGNOSIS — I442 Atrioventricular block, complete: Secondary | ICD-10-CM

## 2017-12-10 DIAGNOSIS — I4821 Permanent atrial fibrillation: Secondary | ICD-10-CM

## 2017-12-10 DIAGNOSIS — Z95810 Presence of automatic (implantable) cardiac defibrillator: Secondary | ICD-10-CM | POA: Diagnosis not present

## 2017-12-10 LAB — CUP PACEART INCLINIC DEVICE CHECK
Battery Remaining Longevity: 99 mo
Battery Voltage: 3.03 V
Brady Statistic AP VP Percent: 0 %
Brady Statistic AP VS Percent: 0 %
Brady Statistic AS VP Percent: 0 %
Brady Statistic AS VS Percent: 0 %
Brady Statistic RA Percent Paced: 0 %
Brady Statistic RV Percent Paced: 87.15 %
Date Time Interrogation Session: 20190401145249
HighPow Impedance: 63 Ohm
Implantable Lead Implant Date: 20181228
Implantable Lead Implant Date: 20181228
Implantable Lead Implant Date: 20181228
Implantable Lead Location: 753858
Implantable Lead Location: 753859
Implantable Lead Location: 753860
Implantable Lead Model: 4598
Implantable Lead Model: 5076
Implantable Pulse Generator Implant Date: 20181228
Lead Channel Impedance Value: 166.114
Lead Channel Impedance Value: 174.595
Lead Channel Impedance Value: 178.5 Ohm
Lead Channel Impedance Value: 180 Ohm
Lead Channel Impedance Value: 194.634
Lead Channel Impedance Value: 323 Ohm
Lead Channel Impedance Value: 342 Ohm
Lead Channel Impedance Value: 342 Ohm
Lead Channel Impedance Value: 380 Ohm
Lead Channel Impedance Value: 399 Ohm
Lead Channel Impedance Value: 4047 Ohm
Lead Channel Impedance Value: 437 Ohm
Lead Channel Impedance Value: 551 Ohm
Lead Channel Impedance Value: 570 Ohm
Lead Channel Impedance Value: 608 Ohm
Lead Channel Impedance Value: 608 Ohm
Lead Channel Impedance Value: 646 Ohm
Lead Channel Impedance Value: 665 Ohm
Lead Channel Pacing Threshold Amplitude: 0.5 V
Lead Channel Pacing Threshold Amplitude: 0.75 V
Lead Channel Pacing Threshold Pulse Width: 0.4 ms
Lead Channel Pacing Threshold Pulse Width: 0.4 ms
Lead Channel Sensing Intrinsic Amplitude: 13.25 mV
Lead Channel Sensing Intrinsic Amplitude: 13.625 mV
Lead Channel Setting Pacing Amplitude: 1 V
Lead Channel Setting Pacing Amplitude: 2.5 V
Lead Channel Setting Pacing Pulse Width: 0.4 ms
Lead Channel Setting Pacing Pulse Width: 0.4 ms
Lead Channel Setting Sensing Sensitivity: 0.3 mV

## 2017-12-10 NOTE — Progress Notes (Signed)
PCP: Lavone Orn, MD Primary Cardiologist:  Dr Aundra Dubin Primary EP: Dr Micael Hampshire Cheryl Vargas is a 83 y.o. female who presents today for routine electrophysiology followup.  Since her recent AV nodal ablation and BiV ICD implant, the patient reports doing very well.  Today, she denies symptoms of palpitations, chest pain, shortness of breath,  lower extremity edema, dizziness, presyncope, syncope, or ICD shocks.  The patient is otherwise without complaint today.   Past Medical History:  Diagnosis Date  . Arthritis   . Chronic combined systolic (congestive) and diastolic (congestive) heart failure ([place])   . Mitral regurgitation   . Non-ischemic cardiomyopathy ([place])   . Persistent atrial fibrillation ([place])   . Ventricular tachycardia [facility])    Past Surgical History:  Procedure Laterality Date  . AV NODE ABLATION N/A 09/07/2017   Procedure: AV NODE ABLATION;  Surgeon: [name], MD;  Location: [place] [facility];  Service: Cardiovascular;  Laterality: N/A;  . BIV ICD INSERTION CRT-D N/A 09/07/2017   Procedure: BIV ICD INSERTION CRT-D;  Surgeon: [name], MD;  Location: [place] [facility];  Service: Cardiovascular;  Laterality: N/A;  . BUNIONECTOMY    . CARDIAC CATHETERIZATION     in 2004 at [facility]. "Insignificant blockage" per patient  . CARDIAC CATHETERIZATION N/A 04/25/2016   Procedure: Left Heart Cath and Coronary Angiography;  Surgeon: Nelva Bush, MD;  Location: [place] [facility];  Service: Cardiovascular;  Laterality: N/A;  . CARDIOVERSION N/A 06/29/2017   Procedure: CARDIOVERSION;  Surgeon: Larey Dresser, MD;  Location: [facility] ENDOSCOPY;  Service: Cardiovascular;  Laterality: N/A;  . CARDIOVERSION N/A 08/28/2017   Procedure: CARDIOVERSION;  Surgeon: Larey Dresser, MD;  Location: [facility] ENDOSCOPY;  Service: Cardiovascular;  Laterality: N/A;  . SHOULDER SURGERY     closed reduction  . TEE WITHOUT CARDIOVERSION N/A 06/29/2017   Procedure: TRANSESOPHAGEAL  ECHOCARDIOGRAM (TEE);  Surgeon: Larey Dresser, MD;  Location: [facility] ENDOSCOPY;  Service: Cardiovascular;  Laterality: N/A;  . TONSILLECTOMY    . TOTAL HIP ARTHROPLASTY Left 03/18/2013   Procedure: TOTAL HIP ARTHROPLASTY ANTERIOR APPROACH;  Surgeon: Mauri Pole, MD;  Location: [facility];  Service: Orthopedics;  Laterality: Left;  . TUBAL LIGATION      ROS- all systems are reviewed and negative except as per HPI above  Current Outpatient Medications  Medication Sig Dispense Refill  . acetaminophen (TYLENOL) 500 MG tablet Take 500 mg by mouth every 6 (six) hours as needed for moderate pain.    . bisoprolol (ZEBETA) 10 MG tablet Take 5 mg by mouth 2 (two) times daily.    . calcium carbonate (TUMS - DOSED IN MG ELEMENTAL CALCIUM) 500 MG chewable tablet Chew 1-2 tablets (200-400 mg of elemental calcium total) by mouth as needed for indigestion or heartburn. 30 tablet 0  . docusate sodium (COLACE) 100 MG capsule Take 100 mg by mouth daily as needed for mild constipation.    . fluticasone (FLONASE) 50 MCG/ACT nasal spray Place 1 spray into both nostrils daily as needed for allergies or rhinitis.    . furosemide (LASIX) 20 MG tablet TAKE 2 TABLETS BY MOUTH DAILY . TAKE AN EXTRA TABLET DAILY AS NEEDED FOR WEIGHT GAIN OF GREATER THAN 3 POUNDS  3  . losartan (COZAAR) 25 MG tablet Take ONE-HALF TABLET BY MOUTH at bedtime 45 tablet 0  . naphazoline-pheniramine (NAPHCON-A) 0.025-0.3 % ophthalmic solution Place 1 drop into both eyes daily as needed (dry eyes).     . potassium chloride  SA (K-DUR,KLOR-CON) 20 MEQ tablet Take 1 tablet (20 mEq total) by mouth daily. 90 tablet 3  . Rivaroxaban (XARELTO) 15 MG TABS tablet Take 1 tablet (15 mg total) by mouth daily with supper. RESUME ON 2 JAN 30 tablet 11  . zolpidem (AMBIEN) 5 MG tablet Take 1 tablet (5 mg total) by mouth at bedtime. 5 tablet 0   No current facility-administered medications for this visit.     Physical Exam: Vitals:   12/10/17 1111  BP: 118/76   Pulse: 100  Weight: 64.4 kg (142 lb)  Height: 5\' 5"  (1.651 m)    GEN- The patient is well appearing, alert and oriented x 3 today.   Head- normocephalic, atraumatic Eyes-  Sclera clear, conjunctiva pink Ears- hearing intact Oropharynx- clear Lungs- Clear to ausculation bilaterally, normal work of breathing Chest- ICD pocket is well healed Heart- Regular rate and rhythm (paced) GI- soft, NT, ND, + BS Extremities- no clubbing, cyanosis, or edema  ICD interrogation- reviewed in detail today,  See PACEART report  ekg tracing 10/24/17 reveals afib with BiV pacing  Assessment and Plan:  1.  Chronic systolic dysfunction euvolemic today Stable on an appropriate medical regimen Normal BiV ICD function See Pace Art report No changes today Due to PVCs, biv paced only 87%.  Pacing rate at 80 bpm. Will keep as is for now.  2. Complete heart block S/p AV nodal ablation Normal BiV ICD function  3. Permanent afib Rate controlled On anticoagulation  4. Nonischemic CM/ MR Repeat echo in 3 months to reassess response to BiV pacing  5. VT Admitted 12/18 with symptomatic VT Doing well, without recurrence No changes today  Follow-up in CHF clinic as scheduled [facility]  Return to see EP NP in 3 months  [name] MD, [facility] 12/10/2017 11:29 AM

## 2017-12-10 NOTE — Patient Instructions (Addendum)
Medication Instructions:  Your physician recommends that you continue on your current medications as directed. Please refer to the Current Medication list given to you today.  Labwork: None ordered.  Testing/Procedures: None ordered.  Follow-Up: Your physician wants you to follow-up in:  3 months with Chanetta Marshall, NP.  Remote monitoring is used to monitor your ICD from home. This monitoring reduces the number of office visits required to check your device to one time per year. It allows [place] to keep an eye on the functioning of your device to ensure it is working properly. You are scheduled for a device check from home on 03/11/2018. You may send your transmission at any time that day. If you have a wireless device, the transmission will be sent automatically. After your physician reviews your transmission, you will receive a postcard with your next transmission date.  Any Other Special Instructions Will Be Listed Below (If Applicable).  If you need a refill on your cardiac medications before your next appointment, please call your pharmacy.

## 2017-12-13 ENCOUNTER — Telehealth: Payer: Self-pay | Admitting: Nurse Practitioner

## 2017-12-13 NOTE — Telephone Encounter (Signed)
Informed pt that I have not known a hand held dryer to cause issue with a device but informed pt that if during the use of a hand held dryer she starts to feel dizzy or like she is going to pass out to turn the dryer off and sit down, pt voiced understanding.

## 2017-12-13 NOTE — Telephone Encounter (Signed)
Patient calling,    1. Has your device fired? no  2. Is you device beeping? no  3. Are you experiencing draining or swelling at device site?no  4. Are you calling to see if we received your device transmission? no  5. Have you passed out? No  Patient would like to verify that she should not use hand held dryer     Please route to [place]

## 2017-12-14 ENCOUNTER — Telehealth: Payer: Self-pay | Admitting: Internal Medicine

## 2017-12-14 NOTE — Telephone Encounter (Signed)
Spoke with patient and explained that during Afib a pulse oximeter will not provide accurate HR information. She verbalized understanding.

## 2017-12-14 NOTE — Telephone Encounter (Signed)
New message    1. Has your device fired? no  2. Is you device beeping? no  3. Are you experiencing draining or swelling at device site? no  4. Are you calling to see if we received your device transmission? She said her pulse ox is reading 53, and earlier it was 39 with hr of 50 , patient said she was in afib earlier and hr was 103 and pulse ox was 83 ,    5. Have you passed out?  No     Please route to [place]

## 2017-12-16 ENCOUNTER — Emergency Department (HOSPITAL_COMMUNITY): Payer: PPO

## 2017-12-16 ENCOUNTER — Encounter (HOSPITAL_COMMUNITY): Payer: Self-pay

## 2017-12-16 ENCOUNTER — Inpatient Hospital Stay (HOSPITAL_COMMUNITY)
Admission: EM | Admit: 2017-12-16 | Discharge: 2017-12-21 | DRG: 291 | Disposition: A | Discharge status: Home / Self Care | Payer: PPO | Attending: Internal Medicine | Admitting: Internal Medicine

## 2017-12-16 DIAGNOSIS — Z7901 Long term (current) use of anticoagulants: Secondary | ICD-10-CM | POA: Diagnosis not present

## 2017-12-16 DIAGNOSIS — Z79899 Other long term (current) drug therapy: Secondary | ICD-10-CM | POA: Diagnosis not present

## 2017-12-16 DIAGNOSIS — I4891 Unspecified atrial fibrillation: Secondary | ICD-10-CM | POA: Diagnosis present

## 2017-12-16 DIAGNOSIS — I10 Essential (primary) hypertension: Secondary | ICD-10-CM | POA: Diagnosis not present

## 2017-12-16 DIAGNOSIS — I5043 Acute on chronic combined systolic (congestive) and diastolic (congestive) heart failure: Secondary | ICD-10-CM | POA: Diagnosis not present

## 2017-12-16 DIAGNOSIS — Z801 Family history of malignant neoplasm of trachea, bronchus and lung: Secondary | ICD-10-CM | POA: Diagnosis not present

## 2017-12-16 DIAGNOSIS — I493 Ventricular premature depolarization: Secondary | ICD-10-CM | POA: Diagnosis not present

## 2017-12-16 DIAGNOSIS — Z8674 Personal history of sudden cardiac arrest: Secondary | ICD-10-CM | POA: Diagnosis not present

## 2017-12-16 DIAGNOSIS — I472 Ventricular tachycardia: Secondary | ICD-10-CM | POA: Diagnosis not present

## 2017-12-16 DIAGNOSIS — N183 Chronic kidney disease, stage 3 unspecified: Secondary | ICD-10-CM | POA: Diagnosis present

## 2017-12-16 DIAGNOSIS — I509 Heart failure, unspecified: Secondary | ICD-10-CM

## 2017-12-16 DIAGNOSIS — R0602 Shortness of breath: Secondary | ICD-10-CM | POA: Diagnosis not present

## 2017-12-16 DIAGNOSIS — Z96642 Presence of left artificial hip joint: Secondary | ICD-10-CM | POA: Diagnosis not present

## 2017-12-16 DIAGNOSIS — I130 Hypertensive heart and chronic kidney disease with heart failure and stage 1 through stage 4 chronic kidney disease, or unspecified chronic kidney disease: Secondary | ICD-10-CM | POA: Diagnosis not present

## 2017-12-16 DIAGNOSIS — I482 Chronic atrial fibrillation: Secondary | ICD-10-CM | POA: Diagnosis present

## 2017-12-16 DIAGNOSIS — R110 Nausea: Secondary | ICD-10-CM | POA: Diagnosis not present

## 2017-12-16 DIAGNOSIS — I4821 Permanent atrial fibrillation: Secondary | ICD-10-CM | POA: Diagnosis present

## 2017-12-16 DIAGNOSIS — I255 Ischemic cardiomyopathy: Secondary | ICD-10-CM | POA: Diagnosis present

## 2017-12-16 DIAGNOSIS — I340 Nonrheumatic mitral (valve) insufficiency: Secondary | ICD-10-CM

## 2017-12-16 DIAGNOSIS — I442 Atrioventricular block, complete: Secondary | ICD-10-CM | POA: Diagnosis present

## 2017-12-16 DIAGNOSIS — Z8249 Family history of ischemic heart disease and other diseases of the circulatory system: Secondary | ICD-10-CM

## 2017-12-16 DIAGNOSIS — E785 Hyperlipidemia, unspecified: Secondary | ICD-10-CM | POA: Diagnosis not present

## 2017-12-16 DIAGNOSIS — E876 Hypokalemia: Secondary | ICD-10-CM | POA: Diagnosis not present

## 2017-12-16 DIAGNOSIS — I481 Persistent atrial fibrillation: Secondary | ICD-10-CM | POA: Diagnosis not present

## 2017-12-16 DIAGNOSIS — I361 Nonrheumatic tricuspid (valve) insufficiency: Secondary | ICD-10-CM | POA: Diagnosis not present

## 2017-12-16 DIAGNOSIS — I081 Rheumatic disorders of both mitral and tricuspid valves: Secondary | ICD-10-CM | POA: Diagnosis not present

## 2017-12-16 DIAGNOSIS — I5023 Acute on chronic systolic (congestive) heart failure: Secondary | ICD-10-CM | POA: Diagnosis not present

## 2017-12-16 DIAGNOSIS — I5022 Chronic systolic (congestive) heart failure: Secondary | ICD-10-CM | POA: Diagnosis not present

## 2017-12-16 DIAGNOSIS — K5900 Constipation, unspecified: Secondary | ICD-10-CM | POA: Diagnosis not present

## 2017-12-16 DIAGNOSIS — I480 Paroxysmal atrial fibrillation: Secondary | ICD-10-CM | POA: Diagnosis not present

## 2017-12-16 DIAGNOSIS — I5041 Acute combined systolic (congestive) and diastolic (congestive) heart failure: Secondary | ICD-10-CM | POA: Diagnosis present

## 2017-12-16 DIAGNOSIS — Z95810 Presence of automatic (implantable) cardiac defibrillator: Secondary | ICD-10-CM | POA: Diagnosis not present

## 2017-12-16 DIAGNOSIS — I351 Nonrheumatic aortic (valve) insufficiency: Secondary | ICD-10-CM | POA: Diagnosis not present

## 2017-12-16 LAB — I-STAT TROPONIN, ED: Troponin i, poc: 0.02 ng/mL (ref 0.00–0.08)

## 2017-12-16 LAB — CBC
HCT: 44.1 % (ref 36.0–46.0)
Hemoglobin: 15.3 g/dL — ABNORMAL HIGH (ref 12.0–15.0)
MCH: 31 pg (ref 26.0–34.0)
MCHC: 34.7 g/dL (ref 30.0–36.0)
MCV: 89.5 fL (ref 78.0–100.0)
Platelets: 173 10*3/uL (ref 150–400)
RBC: 4.93 MIL/uL (ref 3.87–5.11)
RDW: 13.7 % (ref 11.5–15.5)
WBC: 7.8 10*3/uL (ref 4.0–10.5)

## 2017-12-16 LAB — BASIC METABOLIC PANEL
Anion gap: 14 (ref 5–15)
BUN: 27 mg/dL — ABNORMAL HIGH (ref 6–20)
CO2: 20 mmol/L — ABNORMAL LOW (ref 22–32)
Calcium: 9.3 mg/dL (ref 8.9–10.3)
Chloride: 103 mmol/L (ref 101–111)
Creatinine, Ser: 1.13 mg/dL — ABNORMAL HIGH (ref 0.44–1.00)
GFR calc Af Amer: 52 mL/min — ABNORMAL LOW (ref >60)
GFR calc non Af Amer: 45 mL/min — ABNORMAL LOW (ref >60)
Glucose, Bld: 138 mg/dL — ABNORMAL HIGH (ref 65–99)
Potassium: 3.8 mmol/L (ref 3.5–5.1)
Sodium: 137 mmol/L (ref 135–145)

## 2017-12-16 LAB — BRAIN NATRIURETIC PEPTIDE: B Natriuretic Peptide: 971.1 pg/mL — ABNORMAL HIGH (ref 0.0–100.0)

## 2017-12-16 LAB — HEPATIC FUNCTION PANEL
ALT: 38 U/L (ref 14–54)
AST: 48 U/L — ABNORMAL HIGH (ref 15–41)
Albumin: 3.5 g/dL (ref 3.5–5.0)
Alkaline Phosphatase: 69 U/L (ref 38–126)
Bilirubin, Direct: 0.2 mg/dL (ref 0.1–0.5)
Indirect Bilirubin: 0.8 mg/dL (ref 0.3–0.9)
Total Bilirubin: 1 mg/dL (ref 0.3–1.2)
Total Protein: 7.1 g/dL (ref 6.5–8.1)

## 2017-12-16 MED ORDER — DOCUSATE SODIUM 100 MG PO CAPS
100.0000 mg | ORAL_CAPSULE | Freq: Every day | ORAL | Status: DC | PRN
Start: 2017-12-16 — End: 2017-12-21
  Administered 2017-12-19: 100 mg via ORAL
  Filled 2017-12-16: qty 1

## 2017-12-16 MED ORDER — CALCIUM CARBONATE ANTACID 500 MG PO CHEW: 1.0000 | CHEWABLE_TABLET | ORAL | Status: DC | PRN

## 2017-12-16 MED ORDER — HYDRALAZINE HCL 20 MG/ML IJ SOLN: 5.0000 mg | INTRAMUSCULAR | Status: DC | PRN

## 2017-12-16 MED ORDER — FLUTICASONE PROPIONATE 50 MCG/ACT NA SUSP: 1.0000 | Freq: Every day | NASAL | Status: DC | PRN

## 2017-12-16 MED ORDER — LOSARTAN POTASSIUM 25 MG PO TABS
12.5000 mg | ORAL_TABLET | Freq: Every day | ORAL | Status: DC
  Administered 2017-12-17 – 2017-12-20 (×4): 12.5 mg via ORAL
  Filled 2017-12-16 (×2): qty 1
  Filled 2017-12-16: qty 0.5
  Filled 2017-12-16: qty 1

## 2017-12-16 MED ORDER — FUROSEMIDE 20 MG PO TABS
40.0000 mg | ORAL_TABLET | Freq: Every day | ORAL | Status: DC
Start: 2017-12-17 — End: 2017-12-17
  Filled 2017-12-16: qty 2

## 2017-12-16 MED ORDER — HYDROXYZINE HCL 10 MG PO TABS: 10.0000 mg | ORAL_TABLET | Freq: Three times a day (TID) | ORAL | Status: DC | PRN

## 2017-12-16 MED ORDER — NAPHAZOLINE-PHENIRAMINE 0.025-0.3 % OP SOLN: 1.0000 [drp] | Freq: Every day | OPHTHALMIC | Status: DC | PRN

## 2017-12-16 MED ORDER — SODIUM CHLORIDE 0.9 % IV SOLN: 250.0000 mL | INTRAVENOUS | Status: DC | PRN

## 2017-12-16 MED ORDER — FUROSEMIDE 10 MG/ML IJ SOLN
40.0000 mg | Freq: Once | INTRAMUSCULAR | Status: AC
  Administered 2017-12-16: 40 mg via INTRAVENOUS
  Filled 2017-12-16: qty 4

## 2017-12-16 MED ORDER — ACETAMINOPHEN 500 MG PO TABS
500.0000 mg | ORAL_TABLET | Freq: Four times a day (QID) | ORAL | Status: DC | PRN
Start: 2017-12-16 — End: 2017-12-21
  Administered 2017-12-17: 500 mg via ORAL
  Filled 2017-12-16: qty 1

## 2017-12-16 MED ORDER — BISOPROLOL FUMARATE 5 MG PO TABS
5.0000 mg | ORAL_TABLET | Freq: Two times a day (BID) | ORAL | Status: DC
  Administered 2017-12-17 – 2017-12-21 (×9): 5 mg via ORAL
  Filled 2017-12-16 (×10): qty 1

## 2017-12-16 MED ORDER — ZOLPIDEM TARTRATE 5 MG PO TABS
5.0000 mg | ORAL_TABLET | Freq: Every day | ORAL | Status: DC
  Administered 2017-12-17 – 2017-12-20 (×5): 5 mg via ORAL
  Filled 2017-12-16 (×5): qty 1

## 2017-12-16 MED ORDER — SODIUM CHLORIDE 0.9% FLUSH: 3.0000 mL | INTRAVENOUS | Status: DC | PRN

## 2017-12-16 MED ORDER — SODIUM CHLORIDE 0.9% FLUSH
3.0000 mL | Freq: Two times a day (BID) | INTRAVENOUS | Status: DC
  Administered 2017-12-16 – 2017-12-21 (×10): 3 mL via INTRAVENOUS

## 2017-12-16 MED ORDER — RIVAROXABAN 15 MG PO TABS
15.0000 mg | ORAL_TABLET | Freq: Every day | ORAL | Status: DC
  Administered 2017-12-17 – 2017-12-20 (×4): 15 mg via ORAL
  Filled 2017-12-16 (×5): qty 1

## 2017-12-16 MED ORDER — LEVALBUTEROL HCL 1.25 MG/0.5ML IN NEBU
1.2500 mg | INHALATION_SOLUTION | Freq: Four times a day (QID) | RESPIRATORY_TRACT | Status: DC
  Administered 2017-12-17: 1.25 mg via RESPIRATORY_TRACT
  Filled 2017-12-16 (×5): qty 0.5

## 2017-12-16 NOTE — ED Notes (Signed)
Pacemaker interogated °

## 2017-12-16 NOTE — H&P (Addendum)
History and Physical    Cheryl Vargas EXH:[id number] DOB: [birth year] DOA: 12/16/2017  Referring MD/NP/PA:   PCP: Lavone Orn, MD   Patient coming from:  The patient is coming from assistant living facility. At baseline, pt is partially dependent for most of ADL  Chief Complaint: SOB  HPI: Cheryl Vargas is a 83 y.o. female with medical history significant of sCHF with EF 25%, V. Tach, a fib on Xarelto, mitral regurgitation, cardiac arrest due to completed heart block, ICD placement, CKD-3, hyperlipidemia, who presents with shortness breath.  Pt states that she has had several episodes of shortness of breath since last night. Initial episode happened when she was carrying a chair last night. It lasted for about 10-15 min and resolved after she had a rest. Tonight she had similar symptoms again. She he does not have chest pain, cough, fever or chills. She felt nauseated when she had shortness of breath. No vomiting, diarrhea or abdominal pain and no symptoms of UTI. Denies unilateral weakness. She denies any increasing peripheral edema, but she states that she carries her weight in her abdomen and she does feel like her abdominal girth is increasing.  ED Course: pt was found to have trop 971, WBC 7.8, negative troponin, slightly worsening renal function, temperature normal, heart rate 90-100s, O2 sat 100% on room air, no tachypnea, negative chest x-ray. Patient is placed on telemetry bed for observation.  Review of Systems:   General: no fevers, chills, no body weight gain, has fatigue HEENT: no blurry vision, hearing changes or sore throat Respiratory: has dyspnea, no coughing, wheezing CV: no chest pain, no palpitations GI: no nausea, vomiting, abdominal pain, diarrhea, constipation GU: no dysuria, burning on urination, increased urinary frequency, hematuria  Ext: has mild leg edema Neuro: no unilateral weakness, numbness, or tingling, no vision change or hearing loss Skin: no rash,  no skin tear. MSK: No muscle spasm, no deformity, no limitation of range of movement in spin Heme: No easy bruising.  Travel history: No recent long distant travel.  Allergy:  Allergies  Allergen Reactions  . Sotalol Other (See Comments)    Prolonged QTc  . Alendronate Sodium Other (See Comments)    Arm pain  . Amiodarone Nausea Only  . Ciprofloxacin Other (See Comments)    Not effective  . Coreg [Carvedilol] Other (See Comments)    Fatigue   . Metoprolol Other (See Comments)    Profound fatigue    Past Medical History:  Diagnosis Date  . Arthritis   . Chronic combined systolic (congestive) and diastolic (congestive) heart failure ([place])   . Mitral regurgitation   . Non-ischemic cardiomyopathy ([place])   . Persistent atrial fibrillation ([place])   . Ventricular tachycardia [facility])     Past Surgical History:  Procedure Laterality Date  . AV NODE ABLATION N/A 09/07/2017   Procedure: AV NODE ABLATION;  Surgeon: [name], MD;  Location: [place] [facility];  Service: Cardiovascular;  Laterality: N/A;  . BIV ICD INSERTION CRT-D N/A 09/07/2017   Procedure: BIV ICD INSERTION CRT-D;  Surgeon: [name], MD;  Location: [place] [facility];  Service: Cardiovascular;  Laterality: N/A;  . BUNIONECTOMY    . CARDIAC CATHETERIZATION     in 2004 at [facility]. "Insignificant blockage" per patient  . CARDIAC CATHETERIZATION N/A 04/25/2016   Procedure: Left Heart Cath and Coronary Angiography;  Surgeon: Nelva Bush, MD;  Location: [place] [facility];  Service: Cardiovascular;  Laterality: N/A;  . CARDIOVERSION N/A 06/29/2017  Procedure: CARDIOVERSION;  Surgeon: Larey Dresser, MD;  Location: [facility] ENDOSCOPY;  Service: Cardiovascular;  Laterality: N/A;  . CARDIOVERSION N/A 08/28/2017   Procedure: CARDIOVERSION;  Surgeon: Larey Dresser, MD;  Location: [facility] ENDOSCOPY;  Service: Cardiovascular;  Laterality: N/A;  . SHOULDER SURGERY     closed reduction  . TEE WITHOUT CARDIOVERSION N/A  06/29/2017   Procedure: TRANSESOPHAGEAL ECHOCARDIOGRAM (TEE);  Surgeon: Larey Dresser, MD;  Location: [facility] ENDOSCOPY;  Service: Cardiovascular;  Laterality: N/A;  . TONSILLECTOMY    . TOTAL HIP ARTHROPLASTY Left 03/18/2013   Procedure: TOTAL HIP ARTHROPLASTY ANTERIOR APPROACH;  Surgeon: Mauri Pole, MD;  Location: [facility];  Service: Orthopedics;  Laterality: Left;  . TUBAL LIGATION      Social History:  reports that she has never smoked. She has never used smokeless tobacco. She reports that she drinks about 1.8 oz of alcohol per week. She reports that she does not use drugs.  Family History:  Family History  Problem Relation Age of Onset  . Lung cancer Mother   . Heart attack Father      Prior to Admission medications   Medication Sig Start Date End Date Taking? Authorizing Provider  acetaminophen (TYLENOL) 500 MG tablet Take 500 mg by mouth every 6 (six) hours as needed for moderate pain.    [provider]  bisoprolol (ZEBETA) 10 MG tablet Take 5 mg by mouth 2 (two) times daily.    [provider]  calcium carbonate (TUMS - DOSED IN MG ELEMENTAL CALCIUM) 500 MG chewable tablet Chew 1-2 tablets (200-400 mg of elemental calcium total) by mouth as needed for indigestion or heartburn. 09/09/17   Allie Bossier, MD  docusate sodium (COLACE) 100 MG capsule Take 100 mg by mouth daily as needed for mild constipation.    [provider]  fluticasone (FLONASE) 50 MCG/ACT nasal spray Place 1 spray into both nostrils daily as needed for allergies or rhinitis.    [provider]  furosemide (LASIX) 20 MG tablet TAKE 2 TABLETS BY MOUTH DAILY . TAKE AN EXTRA TABLET DAILY AS NEEDED FOR WEIGHT GAIN OF GREATER THAN 3 POUNDS 10/24/17   [provider]  losartan (COZAAR) 25 MG tablet Take ONE-HALF TABLET BY MOUTH at bedtime 11/28/17   Larey Dresser, MD  naphazoline-pheniramine (NAPHCON-A) 0.025-0.3 % ophthalmic solution Place 1 drop into both eyes daily as  needed (dry eyes).     [provider]  potassium chloride SA (K-DUR,KLOR-CON) 20 MEQ tablet Take 1 tablet (20 mEq total) by mouth daily. 10/24/17   Larey Dresser, MD  Rivaroxaban (XARELTO) 15 MG TABS tablet Take 1 tablet (15 mg total) by mouth daily with supper. RESUME ON 2 JAN 09/09/17   Allie Bossier, MD  zolpidem (AMBIEN) 5 MG tablet Take 1 tablet (5 mg total) by mouth at bedtime. 09/20/17   Mariel Aloe, MD    Physical Exam: Vitals:   12/16/17 2230 12/16/17 2245 12/16/17 2300 12/16/17 2315  BP: 107/67 101/74 111/85 117/79  Pulse: (!) 50 65 98 63  Resp: 20 14 (!) 22 16  Temp:      TempSrc:      SpO2: 99% 96% 97% 95%  Weight:      Height:       General: Not in acute distress HEENT:       Eyes: PERRL, EOMI, no scleral icterus.       ENT: No discharge from the ears and nose, no pharynx injection,  no tonsillar enlargement.        Neck: No JVD, no bruit, no mass felt. Heme: No neck lymph node enlargement. Cardiac: S1/S2, irregularly irregular rhythm, No murmurs, No gallops or rubs. Respiratory: No rales, wheezing, rhonchi or rubs. GI: Soft, nondistended, nontender, no rebound pain, no organomegaly, BS present. GU: No hematuria Ext: mild leg edema bilaterally. 2+DP/PT pulse bilaterally. Musculoskeletal: No joint deformities, No joint redness or warmth, no limitation of ROM in spin. Skin: No rashes.  Neuro: Alert, oriented X3, cranial nerves II-XII grossly intact, moves all extremities normally. Psych: Patient is not psychotic, no suicidal or hemocidal ideation.  Labs on Admission: I have personally reviewed following labs and imaging studies  CBC: Recent Labs  Lab 12/16/17 2034  WBC 7.8  HGB 15.3*  HCT 44.1  MCV 89.5  PLT 629   Basic Metabolic Panel: Recent Labs  Lab 12/16/17 2034  NA 137  K 3.8  CL 103  CO2 20*  GLUCOSE 138*  BUN 27*  CREATININE 1.13*  CALCIUM 9.3   GFR: Estimated Creatinine Clearance: 35.7 mL/min (A) (by C-G formula based on  SCr of 1.13 mg/dL (H)). Liver Function Tests: Recent Labs  Lab 12/16/17 2111  AST 48*  ALT 38  ALKPHOS 69  BILITOT 1.0  PROT 7.1  ALBUMIN 3.5   No results for input(s): LIPASE, AMYLASE in the last 168 hours. No results for input(s): AMMONIA in the last 168 hours. Coagulation Profile: No results for input(s): INR, PROTIME in the last 168 hours. Cardiac Enzymes: No results for input(s): CKTOTAL, CKMB, CKMBINDEX, TROPONINI in the last 168 hours. BNP (last 3 results) No results for input(s): PROBNP in the last 8760 hours. HbA1C: No results for input(s): HGBA1C in the last 72 hours. CBG: No results for input(s): GLUCAP in the last 168 hours. Lipid Profile: No results for input(s): CHOL, HDL, LDLCALC, TRIG, CHOLHDL, LDLDIRECT in the last 72 hours. Thyroid Function Tests: No results for input(s): TSH, T4TOTAL, FREET4, T3FREE, THYROIDAB in the last 72 hours. Anemia Panel: No results for input(s): VITAMINB12, FOLATE, FERRITIN, TIBC, IRON, RETICCTPCT in the last 72 hours. Urine analysis:    Component Value Date/Time   COLORURINE AMBER (A) 09/17/2017 1938   APPEARANCEUR HAZY (A) 09/17/2017 1938   LABSPEC 1.026 09/17/2017 1938   PHURINE 5.0 09/17/2017 1938   GLUCOSEU NEGATIVE 09/17/2017 1938   HGBUR MODERATE (A) 09/17/2017 1938   BILIRUBINUR NEGATIVE 09/17/2017 1938   KETONESUR NEGATIVE 09/17/2017 1938   PROTEINUR 30 (A) 09/17/2017 1938   UROBILINOGEN 0.2 03/17/2013 1423   NITRITE NEGATIVE 09/17/2017 1938   LEUKOCYTESUR SMALL (A) 09/17/2017 1938   Sepsis Labs: @LABRCNTIP ([id number]:7,OZDGUYQIHKV:4) )No results found for this or any previous visit (from the past 240 hour(s)).   Radiological Exams on Admission: Dg Chest 2 View  Result Date: 12/16/2017 CLINICAL DATA:  Shortness of breath. EXAM: CHEST - 2 VIEW COMPARISON:  09/17/2017 FINDINGS: The cardio pericardial silhouette is enlarged. The lungs are clear without focal pneumonia, edema, pneumothorax or pleural effusion.  Left-sided permanent pacemaker again noted. Bones are diffusely demineralized. Telemetry leads overlie the chest. IMPRESSION: No active cardiopulmonary disease. Electronically Signed   By: Misty Stanley M.D.   On: 12/16/2017 21:16     EKG: Independently reviewed.  Atrial fibrillation, QTC 510, PVC, LAD, poor R-wave progression  Assessment/Plan Principal Problem:   SOB (shortness of breath) Active Problems:   Hypertension   Chronic systolic (congestive) heart failure (HCC)   A-fib (HCC)   Systolic and diastolic CHF, acute ([place])  Severe mitral valve regurgitation   CKD (chronic kidney disease), stage III (HCC)   SOB: etiology is not clear. Differential diagnosis include CHF exacerbation, angina equivalents and afib with RVR. Pt has only trace leg edema, her body weight has not increased since 09/20/17 (142LBs remains the same), but her BNP is elevated at 971. Given her low EF 25%, she currently cannot tolerate even slight fluid overload. She felt nauseated when she had shortness of breath, will need to rule out atypical ACS. EDP consulted cardiology, Dr. Teena Dunk recommended to give single dose of IV Lasix 40 mg.   -will place on tele bed for obs -Lasix 40 mg x 1 and then continue her home oral lasix 40 mg daily -trop x 3 -2d echo -Risk factor stratification: A1c, FLP -Daily weights -strict I/O's -Low salt diet  Addendum: trop becomes positive, 0.05 -will add lipitor 40 mg daily  Systolic and diastolic CHF, acute ([place]): . 2d echo on 09/04/17 showed EF 25-30 percent -see above  Atrial Fibrillation: CHA2DS2-VASc Score is , needs oral anticoagulation. Patient is on Coumadin, Pradaxa, Eliquis at home. INR is  on admission. Heart rate is 90-100s. -continue Xarelto and zebeta  CKD (chronic kidney disease), stage III ([place]): baseline creatinine 0.9-1.0. Her creatinine is 1.13, BUN 27, slightly worsening. -Follow up renal function by BMP   DVT ppx: on Xarelto Code Status: Full  code Family Communication: None at bed side. Disposition Plan:  Anticipate discharge back to previous home environment Consults called:  Card, Dr. Teena Dunk Admission status: Obs / tele     Date of Service 12/16/2017    Ivor Costa [facility] Pager [phone]  If 7PM-7AM, please contact night-coverage [email or web address] Password [facility] 12/16/2017, 11:36 PM

## 2017-12-16 NOTE — ED Provider Notes (Signed)
[facility] EMERGENCY DEPARTMENT Provider Note   CSN: [id number] Arrival date & time: 12/16/17  1954     History   Chief Complaint Chief Complaint  Patient presents with  . Shortness of Breath    HPI   Blood pressure 113/70, pulse 97, temperature 97.7 F (36.5 C), temperature source Oral, resp. rate 19, height 5\' 5"  (1.651 m), weight 64.4 kg (142 lb), SpO2 100 %.  Cheryl Vargas is a 83 y.o. female with past medical history significant for CHF, persistent A. fib (anticoagulated with Xarelto), patient went into cardiac arrest after complete heart block in December 2018 (she is status post AV nodal ablation with biventricular internal cardiac defibrillator placed she had a cardiac arrest, she has a pacemaker which was placed several months ago.  She is complaining of episodes of severe shortness of breath with nausea, diaphoresis and presyncopal sensation, initial episode was last night as she was carrying a chair.  She had to sit down because she felt like she was going to pass out, the episode resolved after she had rested.  She went on a walk with her friend at her assisted living area today and she had another episode.  There was no chest pain with this.  She denies any increasing peripheral edema but she states that she carries her weight in her abdomen and she does feel like her abdominal girth is increasing.  She denies any history of DVT/PE she states that she has been more immobile than normal recently she denies any calf pain, leg swelling.  She denies any cough, fever, hemoptysis.  He has never had a heart attack in the past, she states that her last cath was within the last year.  She has been compliant with her 40 mg of Lasix which she takes daily and regularly.  She did call Dr. Jackalyn Lombard office because she felt the irregularity of her heart rate and she was measuring it via a home pulse ox.  She states that this is normal for her to feel like this.  Last EF 20%  after cardiac arrest, she has another echo scheduled within the next month.  Cards: Aundra Dubin and Allred  Past Medical History:  Diagnosis Date  . Arthritis   . Chronic combined systolic (congestive) and diastolic (congestive) heart failure ([place])   . Mitral regurgitation   . Non-ischemic cardiomyopathy ([place])   . Persistent atrial fibrillation ([place])   . Ventricular tachycardia [facility])     Patient Active Problem List   Diagnosis Date Noted  . CKD (chronic kidney disease), stage III ([place]) 12/16/2017  . SOB (shortness of breath)   . Abdominal pain 09/18/2017  . CHF (congestive heart failure) ([place]) 09/17/2017  . Pulmonary edema   . Cardiac device in situ   . Ventricular tachycardia ([place])   . Cardiac arrest ([place])   . Systolic and diastolic CHF, acute ([place])   . Severe mitral valve regurgitation   . A-fib ([place]) 09/03/2017  . Mitral regurgitation   . Chronic systolic (congestive) heart failure ([place])   . Non-ischemic cardiomyopathy ([place])   . CHF (congestive heart failure), NYHA class IV ([place]) 04/24/2016  . Dizziness 12/01/2015  . Hypertension 12/01/2015  . Insomnia 12/01/2015  . Acute on chronic combined systolic (congestive) and diastolic (congestive) heart failure ([place]) 12/01/2015  . Paroxysmal atrial fibrillation (HCC)   . Closed left hip fracture ([place]) 03/17/2013  . Cardiomegaly 03/17/2013  . PVC (premature ventricular contraction) 03/17/2013  . Hyperlipidemia 03/17/2013  Past Surgical History:  Procedure Laterality Date  . AV NODE ABLATION N/A 09/07/2017   Procedure: AV NODE ABLATION;  Surgeon: [name], MD;  Location: [facility] CV LAB;  Service: Cardiovascular;  Laterality: N/A;  . BIV ICD INSERTION CRT-D N/A 09/07/2017   Procedure: BIV ICD INSERTION CRT-D;  Surgeon: [name], MD;  Location: [facility] CV LAB;  Service: Cardiovascular;  Laterality: N/A;  . BUNIONECTOMY    . CARDIAC CATHETERIZATION     in 2004 at [facility]. "Insignificant blockage" per patient    . CARDIAC CATHETERIZATION N/A 04/25/2016   Procedure: Left Heart Cath and Coronary Angiography;  Surgeon: Nelva Bush, MD;  Location: [facility] CV LAB;  Service: Cardiovascular;  Laterality: N/A;  . CARDIOVERSION N/A 06/29/2017   Procedure: CARDIOVERSION;  Surgeon: Larey Dresser, MD;  Location: [facility] ENDOSCOPY;  Service: Cardiovascular;  Laterality: N/A;  . CARDIOVERSION N/A 08/28/2017   Procedure: CARDIOVERSION;  Surgeon: Larey Dresser, MD;  Location: [facility] ENDOSCOPY;  Service: Cardiovascular;  Laterality: N/A;  . SHOULDER SURGERY     closed reduction  . TEE WITHOUT CARDIOVERSION N/A 06/29/2017   Procedure: TRANSESOPHAGEAL ECHOCARDIOGRAM (TEE);  Surgeon: Larey Dresser, MD;  Location: [facility] ENDOSCOPY;  Service: Cardiovascular;  Laterality: N/A;  . TONSILLECTOMY    . TOTAL HIP ARTHROPLASTY Left 03/18/2013   Procedure: TOTAL HIP ARTHROPLASTY ANTERIOR APPROACH;  Surgeon: Mauri Pole, MD;  Location: [facility] ORS;  Service: Orthopedics;  Laterality: Left;  . TUBAL LIGATION       OB History   None      Home Medications    Prior to Admission medications   Medication Sig Start Date End Date Taking? Authorizing Provider  acetaminophen (TYLENOL) 500 MG tablet Take 500 mg by mouth every 6 (six) hours as needed for moderate pain.   Yes [provider]  bisoprolol (ZEBETA) 10 MG tablet Take 5 mg by mouth 2 (two) times daily.   Yes [provider]  calcium carbonate (TUMS - DOSED IN MG ELEMENTAL CALCIUM) 500 MG chewable tablet Chew 1-2 tablets (200-400 mg of elemental calcium total) by mouth as needed for indigestion or heartburn. 09/09/17  Yes Allie Bossier, MD  docusate sodium (COLACE) 100 MG capsule Take 100 mg by mouth daily as needed for mild constipation.   Yes [provider]  fluticasone (FLONASE) 50 MCG/ACT nasal spray Place 1 spray into both nostrils daily as needed for allergies or rhinitis.   Yes [provider]  furosemide (LASIX) 20 MG tablet  TAKE 2 TABLETS BY MOUTH DAILY . TAKE AN EXTRA TABLET DAILY AS NEEDED FOR WEIGHT GAIN OF GREATER THAN 3 POUNDS 10/24/17  Yes [provider]  losartan (COZAAR) 25 MG tablet Take ONE-HALF TABLET BY MOUTH at bedtime 11/28/17  Yes Larey Dresser, MD  naphazoline-pheniramine (NAPHCON-A) 0.025-0.3 % ophthalmic solution Place 1 drop into both eyes daily as needed (dry eyes).    Yes [provider]  potassium chloride SA (K-DUR,KLOR-CON) 20 MEQ tablet Take 1 tablet (20 mEq total) by mouth daily. 10/24/17  Yes Larey Dresser, MD  Rivaroxaban (XARELTO) 15 MG TABS tablet Take 1 tablet (15 mg total) by mouth daily with supper. RESUME ON 2 JAN 09/09/17  Yes Allie Bossier, MD  zolpidem (AMBIEN) 5 MG tablet Take 1 tablet (5 mg total) by mouth at bedtime. 09/20/17  Yes Mariel Aloe, MD    Family History Family History  Problem Relation Age of Onset  . Lung cancer Mother   .  Heart attack Father     Social History Social History   Tobacco Use  . Smoking status: Never Smoker  . Smokeless tobacco: Never Used  Substance Use Topics  . Alcohol use: Yes    Alcohol/week: 1.8 oz    Types: 3 Glasses of wine per week    Comment: per week  . Drug use: No     Allergies   Sotalol; Alendronate sodium; Amiodarone; Ciprofloxacin; Coreg [carvedilol]; and Metoprolol   Review of Systems Review of Systems   Physical Exam Updated Vital Signs BP 117/79   Pulse 63   Temp 97.7 F (36.5 C) (Oral)   Resp 16   Ht 5\' 5"  (1.651 m)   Wt 64.4 kg (142 lb)   SpO2 95%   BMI 23.63 kg/m   Physical Exam  Constitutional: She is oriented to person, place, and time. She appears well-developed and well-nourished. No distress.  HENT:  Head: Normocephalic and atraumatic.  Mouth/Throat: Oropharynx is clear and moist.  Eyes: Pupils are equal, round, and reactive to light. Conjunctivae and EOM are normal.  Neck: Normal range of motion.  Cardiovascular: Normal rate and intact distal pulses.   Irregularly irregular, rate controlled  Pulmonary/Chest: Effort normal and breath sounds normal.  Abdominal: Soft. There is no tenderness.  Musculoskeletal: Normal range of motion.  Neurological: She is alert and oriented to person, place, and time.  Skin: She is not diaphoretic.  Psychiatric: She has a normal mood and affect.  Nursing note and vitals reviewed.    ED Treatments / Results  Labs (all labs ordered are listed, but only abnormal results are displayed) Labs Reviewed  BASIC METABOLIC PANEL - Abnormal; Notable for the following components:      Result Value   CO2 20 (*)    Glucose, Bld 138 (*)    BUN 27 (*)    Creatinine, Ser 1.13 (*)    GFR calc non Af Amer 45 (*)    GFR calc Af Amer 52 (*)    All other components within normal limits  CBC - Abnormal; Notable for the following components:   Hemoglobin 15.3 (*)    All other components within normal limits  BRAIN NATRIURETIC PEPTIDE - Abnormal; Notable for the following components:   B Natriuretic Peptide 971.1 (*)    All other components within normal limits  HEPATIC FUNCTION PANEL - Abnormal; Notable for the following components:   AST 48 (*)    All other components within normal limits  HEMOGLOBIN A1C  LIPID PANEL  TROPONIN I  TROPONIN I  TROPONIN I  BASIC METABOLIC PANEL  I-STAT TROPONIN, ED    EKG EKG Interpretation  Date/Time:  Sunday December 16 2017 20:14:38 EDT Ventricular Rate:  111 PR Interval:    QRS Duration: 162 QT Interval:  414 QTC Calculation: 510 R Axis:   142 Text Interpretation:  Atrial fibrillation Ventricular tachycardia, unsustained Nonspecific intraventricular conduction delay Anterolateral infarct, age indeterminate NO STEMI Confirmed by Addison Lank [phone]) on 12/16/2017 8:23:38 PM   Radiology Dg Chest 2 View  Result Date: 12/16/2017 CLINICAL DATA:  Shortness of breath. EXAM: CHEST - 2 VIEW COMPARISON:  09/17/2017 FINDINGS: The cardio pericardial silhouette is enlarged. The  lungs are clear without focal pneumonia, edema, pneumothorax or pleural effusion. Left-sided permanent pacemaker again noted. Bones are diffusely demineralized. Telemetry leads overlie the chest. IMPRESSION: No active cardiopulmonary disease. Electronically Signed   By: Misty Stanley M.D.   On: 12/16/2017 21:16    Procedures Procedures (including  critical care time)  Medications Ordered in ED Medications  furosemide (LASIX) injection 40 mg (has no administration in time range)  acetaminophen (TYLENOL) tablet 500 mg (has no administration in time range)  bisoprolol (ZEBETA) tablet 5 mg (has no administration in time range)  calcium carbonate (TUMS - dosed in mg elemental calcium) chewable tablet 200-400 mg of elemental calcium (has no administration in time range)  docusate sodium (COLACE) capsule 100 mg (has no administration in time range)  fluticasone (FLONASE) 50 MCG/ACT nasal spray 1 spray (has no administration in time range)  furosemide (LASIX) tablet 40 mg (has no administration in time range)  losartan (COZAAR) tablet 12.5 mg (has no administration in time range)  naphazoline-pheniramine (NAPHCON-A) 0.025-0.3 % ophthalmic solution 1 drop (has no administration in time range)  Rivaroxaban (XARELTO) tablet 15 mg (has no administration in time range)  zolpidem (AMBIEN) tablet 5 mg (has no administration in time range)  hydrALAZINE (APRESOLINE) injection 5 mg (has no administration in time range)  hydrOXYzine (ATARAX/VISTARIL) tablet 10 mg (has no administration in time range)  sodium chloride flush (NS) 0.9 % injection 3 mL (has no administration in time range)  sodium chloride flush (NS) 0.9 % injection 3 mL (has no administration in time range)  0.9 %  sodium chloride infusion (has no administration in time range)  levalbuterol (XOPENEX) nebulizer solution 1.25 mg (has no administration in time range)     Initial Impression / Assessment and Plan / ED Course  I have reviewed the  triage vital signs and the nursing notes.  Pertinent labs & imaging results that were available during my care of the patient were reviewed by me and considered in my medical decision making (see chart for details).     Vitals:   12/16/17 2230 12/16/17 2245 12/16/17 2300 12/16/17 2315  BP: 107/67 101/74 111/85 117/79  Pulse: (!) 50 65 98 63  Resp: 20 14 (!) 22 16  Temp:      TempSrc:      SpO2: 99% 96% 97% 95%  Weight:      Height:        Medications  furosemide (LASIX) injection 40 mg (has no administration in time range)  acetaminophen (TYLENOL) tablet 500 mg (has no administration in time range)  bisoprolol (ZEBETA) tablet 5 mg (has no administration in time range)  calcium carbonate (TUMS - dosed in mg elemental calcium) chewable tablet 200-400 mg of elemental calcium (has no administration in time range)  docusate sodium (COLACE) capsule 100 mg (has no administration in time range)  fluticasone (FLONASE) 50 MCG/ACT nasal spray 1 spray (has no administration in time range)  furosemide (LASIX) tablet 40 mg (has no administration in time range)  losartan (COZAAR) tablet 12.5 mg (has no administration in time range)  naphazoline-pheniramine (NAPHCON-A) 0.025-0.3 % ophthalmic solution 1 drop (has no administration in time range)  Rivaroxaban (XARELTO) tablet 15 mg (has no administration in time range)  zolpidem (AMBIEN) tablet 5 mg (has no administration in time range)  hydrALAZINE (APRESOLINE) injection 5 mg (has no administration in time range)  hydrOXYzine (ATARAX/VISTARIL) tablet 10 mg (has no administration in time range)  sodium chloride flush (NS) 0.9 % injection 3 mL (has no administration in time range)  sodium chloride flush (NS) 0.9 % injection 3 mL (has no administration in time range)  0.9 %  sodium chloride infusion (has no administration in time range)  levalbuterol (XOPENEX) nebulizer solution 1.25 mg (has no administration in time range)  Cheryl Vargas is 83  y.o. female presenting with acute onset of severe shortness of breath, nausea lightheadedness and diaphoresis this is exertional.  She states that she feels like she is retaining fluid in her abdomen.  EKG with frequent PVCs and A. fib.  Blood work reassuring, proBNP pending, chest x-ray clear, lung sounds clear  Discussed with cardiology fellow Dr. Teena Dunk who recommends a single dose of IV Lasix, hospitalist admission.  Discussed with Dr. new who will admit, pacemaker shows no arrhythmia since April however she has several ventricular sensing episodes with her heart rate elevating to 158-167, this lasts 4-12 seconds she has 2 ectopic events with a rate around 100.  Report will be faxed.  Final Clinical Impressions(s) / ED Diagnoses   Final diagnoses:  SOB (shortness of breath)    ED Discharge Orders    None       Faythe Heitzenrater, Charna Elizabeth 12/16/17 2322    Fatima Blank, MD 12/18/17 1810

## 2017-12-16 NOTE — ED Triage Notes (Signed)
Pt coming from home due to SOB. Pt was trying to move a chair and became Sob, diaphoretic, nauseous. Also today on her walk pt started to have the same symptoms. Pt did have some chest tightness in mid abdomin area pain 4/10. Pt was picked up by ems given 4 of zofran for nausea. Pt states she feel better now but this problem comes and goes.

## 2017-12-16 NOTE — ED Notes (Signed)
Patient transported to X-ray 

## 2017-12-17 ENCOUNTER — Other Ambulatory Visit: Payer: Self-pay

## 2017-12-17 ENCOUNTER — Observation Stay (HOSPITAL_BASED_OUTPATIENT_CLINIC_OR_DEPARTMENT_OTHER): Payer: PPO

## 2017-12-17 DIAGNOSIS — I493 Ventricular premature depolarization: Secondary | ICD-10-CM | POA: Diagnosis not present

## 2017-12-17 DIAGNOSIS — I5022 Chronic systolic (congestive) heart failure: Secondary | ICD-10-CM

## 2017-12-17 DIAGNOSIS — I5023 Acute on chronic systolic (congestive) heart failure: Secondary | ICD-10-CM

## 2017-12-17 DIAGNOSIS — I351 Nonrheumatic aortic (valve) insufficiency: Secondary | ICD-10-CM

## 2017-12-17 DIAGNOSIS — I4891 Unspecified atrial fibrillation: Secondary | ICD-10-CM | POA: Diagnosis not present

## 2017-12-17 DIAGNOSIS — N183 Chronic kidney disease, stage 3 (moderate): Secondary | ICD-10-CM | POA: Diagnosis not present

## 2017-12-17 DIAGNOSIS — I482 Chronic atrial fibrillation: Secondary | ICD-10-CM

## 2017-12-17 DIAGNOSIS — I361 Nonrheumatic tricuspid (valve) insufficiency: Secondary | ICD-10-CM

## 2017-12-17 DIAGNOSIS — I10 Essential (primary) hypertension: Secondary | ICD-10-CM | POA: Diagnosis not present

## 2017-12-17 DIAGNOSIS — I481 Persistent atrial fibrillation: Secondary | ICD-10-CM | POA: Diagnosis not present

## 2017-12-17 DIAGNOSIS — I472 Ventricular tachycardia: Secondary | ICD-10-CM | POA: Diagnosis not present

## 2017-12-17 DIAGNOSIS — I442 Atrioventricular block, complete: Secondary | ICD-10-CM | POA: Diagnosis not present

## 2017-12-17 DIAGNOSIS — I340 Nonrheumatic mitral (valve) insufficiency: Secondary | ICD-10-CM | POA: Diagnosis not present

## 2017-12-17 DIAGNOSIS — R0602 Shortness of breath: Secondary | ICD-10-CM | POA: Diagnosis not present

## 2017-12-17 DIAGNOSIS — R110 Nausea: Secondary | ICD-10-CM | POA: Diagnosis not present

## 2017-12-17 LAB — BASIC METABOLIC PANEL
Anion gap: 14 (ref 5–15)
BUN: 23 mg/dL — ABNORMAL HIGH (ref 6–20)
CO2: 24 mmol/L (ref 22–32)
Calcium: 8.8 mg/dL — ABNORMAL LOW (ref 8.9–10.3)
Chloride: 100 mmol/L — ABNORMAL LOW (ref 101–111)
Creatinine, Ser: 1.1 mg/dL — ABNORMAL HIGH (ref 0.44–1.00)
GFR calc Af Amer: 53 mL/min — ABNORMAL LOW (ref >60)
GFR calc non Af Amer: 46 mL/min — ABNORMAL LOW (ref >60)
Glucose, Bld: 180 mg/dL — ABNORMAL HIGH (ref 65–99)
Potassium: 3.2 mmol/L — ABNORMAL LOW (ref 3.5–5.1)
Sodium: 138 mmol/L (ref 135–145)

## 2017-12-17 LAB — LIPID PANEL
Cholesterol: 176 mg/dL (ref 0–200)
HDL: 46 mg/dL (ref >40)
LDL Cholesterol: 120 mg/dL — ABNORMAL HIGH (ref 0–99)
Total CHOL/HDL Ratio: 3.8 RATIO
Triglycerides: 52 mg/dL (ref <150)
VLDL: 10 mg/dL (ref 0–40)

## 2017-12-17 LAB — HEMOGLOBIN A1C
Hgb A1c MFr Bld: 5.6 % (ref 4.8–5.6)
Mean Plasma Glucose: 114.02 mg/dL

## 2017-12-17 LAB — TROPONIN I
Troponin I: 0.04 ng/mL (ref <0.03)
Troponin I: 0.05 ng/mL (ref <0.03)
Troponin I: 0.05 ng/mL (ref <0.03)

## 2017-12-17 LAB — MAGNESIUM: Magnesium: 2 mg/dL (ref 1.7–2.4)

## 2017-12-17 LAB — ECHOCARDIOGRAM COMPLETE
Height: 65 in
Weight: 2272 oz

## 2017-12-17 MED ORDER — POTASSIUM CHLORIDE CRYS ER 20 MEQ PO TBCR
20.0000 meq | EXTENDED_RELEASE_TABLET | Freq: Every day | ORAL | Status: DC
  Administered 2017-12-17 – 2017-12-19 (×3): 20 meq via ORAL
  Filled 2017-12-17 (×3): qty 1

## 2017-12-17 MED ORDER — FUROSEMIDE 40 MG PO TABS
40.0000 mg | ORAL_TABLET | Freq: Every day | ORAL | Status: DC
  Administered 2017-12-18: 40 mg via ORAL
  Filled 2017-12-17: qty 1

## 2017-12-17 MED ORDER — POTASSIUM CHLORIDE CRYS ER 20 MEQ PO TBCR
40.0000 meq | EXTENDED_RELEASE_TABLET | Freq: Once | ORAL | Status: AC
  Administered 2017-12-17: 40 meq via ORAL
  Filled 2017-12-17: qty 2

## 2017-12-17 MED ORDER — FUROSEMIDE 10 MG/ML IJ SOLN: 60.0000 mg | Freq: Once | INTRAMUSCULAR | Status: DC

## 2017-12-17 MED ORDER — ATORVASTATIN CALCIUM 40 MG PO TABS
40.0000 mg | ORAL_TABLET | Freq: Every day | ORAL | Status: DC
  Administered 2017-12-17: 40 mg via ORAL
  Filled 2017-12-17 (×5): qty 1

## 2017-12-17 MED ORDER — MEXILETINE HCL 200 MG PO CAPS
200.0000 mg | ORAL_CAPSULE | Freq: Two times a day (BID) | ORAL | Status: DC
  Administered 2017-12-17 – 2017-12-18 (×3): 200 mg via ORAL
  Filled 2017-12-17 (×3): qty 1

## 2017-12-17 NOTE — ED Notes (Signed)
Patient was given a cup of Ice Water. 

## 2017-12-17 NOTE — Progress Notes (Signed)
  Echocardiogram 2D Echocardiogram has been performed.  Darlina Sicilian M 12/17/2017, 3:10 PM

## 2017-12-17 NOTE — ED Notes (Signed)
Admitting at the bedside.  

## 2017-12-17 NOTE — Progress Notes (Signed)
PROGRESS NOTE    Cheryl Vargas  WSF:[id number] DOB: 03-14-1935 DOA: 12/16/2017 PCP: Lavone Orn, MD   Outpatient Specialists: Zada Finders    Brief Narrative:  Cheryl Vargas is a 83 y.o. female with medical history significant of sCHF with EF 25%, V. Tach, a fib on Xarelto, mitral regurgitation, cardiac arrest due to completed heart block, ICD placement, CKD-3, hyperlipidemia, who presents with shortness breath.  Pt states that she has had several episodes of shortness of breath since last night. Initial episode happened when she was carrying a chair last night. It lasted for about 10-15 min and resolved after she had a rest. Tonight she had similar symptoms again. She he does not have chest pain, cough, fever or chills. She felt nauseated when she had shortness of breath. No vomiting, diarrhea or abdominal pain and no symptoms of UTI. Denies unilateral weakness. She denies any increasing peripheral edema, but she states that she carries her weight in her abdomen and she does feel like her abdominal girth is increasing.  She did have some canned corn as she is unable to drive and get fresh vegetables    Assessment & Plan:   Principal Problem:   SOB (shortness of breath) Active Problems:   Hypertension   Chronic systolic (congestive) heart failure (HCC)   A-fib (HCC)   Systolic and diastolic CHF, acute (HCC)   Severe mitral valve regurgitation   CKD (chronic kidney disease), stage III (HCC)   SOB:  -Lasix 40 mg x 1 and then continue her home oral lasix 40 mg daily in AM -trop x 3- flat so far -2d echo pending -low sat diet -cardiology consult appreciated  Hypokalemia -replete -check Mg  Systolic and diastolic CHF, acute ([place]):  -IV lasix given x 1 with about 700cc out  Atrial Fibrillation:  -continue Xarelto and zebeta -await interrogation of device  CKD (chronic kidney disease), stage III ([place]): -follow closely with diuresis    DVT prophylaxis:    Fully anticoagulated   Code Status: Full Code   Family Communication:   Disposition Plan:     Consultants:  cards  Subjective: Having cramping in left calf  Objective: Vitals:   12/17/17 1118 12/17/17 1152 12/17/17 1200 12/17/17 1215  BP: 103/78 100/68 98/71 (!) 97/59  Pulse: 87 89 92 88  Resp: 18 12 15  (!) 22  Temp:      TempSrc:      SpO2: 100% 98% 99% 100%  Weight:      Height:        Intake/Output Summary (Last 24 hours) at 12/17/2017 1224 Last data filed at 12/17/2017 1149 Gross per 24 hour  Intake 3 ml  Output 650 ml  Net -647 ml   Filed Weights   12/16/17 2028  Weight: 64.4 kg (142 lb)    Examination:  General exam: Appears calm and comfortable-- +JVD Respiratory system: Clear to auscultation. Respiratory effort normal. Cardiovascular system: irr but rate controlled Gastrointestinal system: Abdomen is nondistended, soft and nontender. No organomegaly or masses felt. Normal bowel sounds heard. Central nervous system: Alert and oriented. No focal neurological deficits. Extremities: Symmetric 5 x 5 power. Skin: No rashes, lesions or ulcers Psychiatry: Judgement and insight appear normal. Mood & affect appropriate.     Data Reviewed: I have personally reviewed following labs and imaging studies  CBC: Recent Labs  Lab 12/16/17 2034  WBC 7.8  HGB 15.3*  HCT 44.1  MCV 89.5  PLT 017   Basic Metabolic Panel: Recent  Labs  Lab 12/16/17 2034 12/17/17 0345  NA 137 138  K 3.8 3.2*  CL 103 100*  CO2 20* 24  GLUCOSE 138* 180*  BUN 27* 23*  CREATININE 1.13* 1.10*  CALCIUM 9.3 8.8*   GFR: Estimated Creatinine Clearance: 36.7 mL/min (A) (by C-G formula based on SCr of 1.1 mg/dL (H)). Liver Function Tests: Recent Labs  Lab 12/16/17 2111  AST 48*  ALT 38  ALKPHOS 69  BILITOT 1.0  PROT 7.1  ALBUMIN 3.5   No results for input(s): LIPASE, AMYLASE in the last 168 hours. No results for input(s): AMMONIA in the last 168 hours. Coagulation  Profile: No results for input(s): INR, PROTIME in the last 168 hours. Cardiac Enzymes: Recent Labs  Lab 12/16/17 2337 12/17/17 0345  TROPONINI 0.05* 0.05*   BNP (last 3 results) No results for input(s): PROBNP in the last 8760 hours. HbA1C: Recent Labs    12/17/17 0345  HGBA1C 5.6   CBG: No results for input(s): GLUCAP in the last 168 hours. Lipid Profile: Recent Labs    12/17/17 0345  CHOL 176  HDL 46  LDLCALC 120*  TRIG 52  CHOLHDL 3.8   Thyroid Function Tests: No results for input(s): TSH, T4TOTAL, FREET4, T3FREE, THYROIDAB in the last 72 hours. Anemia Panel: No results for input(s): VITAMINB12, FOLATE, FERRITIN, TIBC, IRON, RETICCTPCT in the last 72 hours. Urine analysis:    Component Value Date/Time   COLORURINE AMBER (A) 09/17/2017 1938   APPEARANCEUR HAZY (A) 09/17/2017 1938   LABSPEC 1.026 09/17/2017 1938   PHURINE 5.0 09/17/2017 1938   GLUCOSEU NEGATIVE 09/17/2017 1938   HGBUR MODERATE (A) 09/17/2017 1938   BILIRUBINUR NEGATIVE 09/17/2017 1938   KETONESUR NEGATIVE 09/17/2017 1938   PROTEINUR 30 (A) 09/17/2017 1938   UROBILINOGEN 0.2 03/17/2013 1423   NITRITE NEGATIVE 09/17/2017 1938   LEUKOCYTESUR SMALL (A) 09/17/2017 1938     )No results found for this or any previous visit (from the past 240 hour(s)).    Anti-infectives (From admission, onward)   None       Radiology Studies: Dg Chest 2 View  Result Date: 12/16/2017 CLINICAL DATA:  Shortness of breath. EXAM: CHEST - 2 VIEW COMPARISON:  09/17/2017 FINDINGS: The cardio pericardial silhouette is enlarged. The lungs are clear without focal pneumonia, edema, pneumothorax or pleural effusion. Left-sided permanent pacemaker again noted. Bones are diffusely demineralized. Telemetry leads overlie the chest. IMPRESSION: No active cardiopulmonary disease. Electronically Signed   By: Misty Stanley M.D.   On: 12/16/2017 21:16        Scheduled Meds: . atorvastatin  40 mg Oral q1800  . bisoprolol  5  mg Oral BID  . furosemide  60 mg Intravenous Once  . levalbuterol  1.25 mg Nebulization Q6H  . losartan  12.5 mg Oral QHS  . potassium chloride SA  20 mEq Oral Daily  . Rivaroxaban  15 mg Oral Q supper  . sodium chloride flush  3 mL Intravenous Q12H  . zolpidem  5 mg Oral QHS   Continuous Infusions: . sodium chloride       LOS: 0 days    Time spent: 35 min    Geradine Girt, DO [facility] Pager [phone]  If 7PM-7AM, please contact night-coverage [email or web address] Password [facility] 12/17/2017, 12:24 PM

## 2017-12-17 NOTE — ED Notes (Signed)
Provider at the bedside.  

## 2017-12-17 NOTE — Consult Note (Addendum)
ELECTROPHYSIOLOGY CONSULT NOTE    Patient ID: RASHAN PATIENT MRN: [id number], DOB/AGE: [birth year] 83 y.o.  Admit date: 12/16/2017 Date of Consult: 12/17/2017  Primary Physician: Lavone Orn, MD Primary Cardiologist: Aundra Dubin Electrophysiologist: Allred  Patient Profile: Cheryl Vargas is a 83 y.o. female with a history of permanent AF with RVR, prior VT arrest, and chronic systolic heart failure who is being seen today for the evaluation of decreased CRT pacing at the request of Dr Haroldine Laws.  HPI:  Cheryl Vargas is a 83 y.o. female with the above past medical history. She underwent CRTD implant and AVN ablation in December of 2018. She has done fairly well since with improvement in functional status.  On Friday of last week, she felt like her heart was "flip flopping".  On Saturday, she developed shortness of breath that worsened and presented to the hospital for further evaluation.  Telemetry has demonstrated V pacing with frequent ventricular ectopy.  Device interrogation demonstrates effective CRT pacing of 78%.  EP has been asked to evaluate for treatment options.   She has previously been intolerant of amiodarone 2/2 nausea. She has also been on Mexiletine during hospitalization 08/2017 with reported nausea at that time but unclear that was truly cause.  She denies chest pain, PND, nausea, vomiting, dizziness, syncope, weight gain, or early satiety.  Past Medical History:  Diagnosis Date  . Arthritis   . Chronic combined systolic (congestive) and diastolic (congestive) heart failure ([place])   . Mitral regurgitation   . Non-ischemic cardiomyopathy ([place])   . Persistent atrial fibrillation ([place])   . Ventricular tachycardia [facility])      Surgical History:  Past Surgical History:  Procedure Laterality Date  . AV NODE ABLATION N/A 09/07/2017   Procedure: AV NODE ABLATION;  Surgeon: [name], MD;  Location: [place] [facility];  Service: Cardiovascular;  Laterality: N/A;  . BIV  ICD INSERTION CRT-D N/A 09/07/2017   Procedure: BIV ICD INSERTION CRT-D;  Surgeon: [name], MD;  Location: [place] [facility];  Service: Cardiovascular;  Laterality: N/A;  . BUNIONECTOMY    . CARDIAC CATHETERIZATION     in 2004 at [facility]. "Insignificant blockage" per patient  . CARDIAC CATHETERIZATION N/A 04/25/2016   Procedure: Left Heart Cath and Coronary Angiography;  Surgeon: Nelva Bush, MD;  Location: [place] [facility];  Service: Cardiovascular;  Laterality: N/A;  . CARDIOVERSION N/A 06/29/2017   Procedure: CARDIOVERSION;  Surgeon: Larey Dresser, MD;  Location: [facility] ENDOSCOPY;  Service: Cardiovascular;  Laterality: N/A;  . CARDIOVERSION N/A 08/28/2017   Procedure: CARDIOVERSION;  Surgeon: Larey Dresser, MD;  Location: [facility] ENDOSCOPY;  Service: Cardiovascular;  Laterality: N/A;  . SHOULDER SURGERY     closed reduction  . TEE WITHOUT CARDIOVERSION N/A 06/29/2017   Procedure: TRANSESOPHAGEAL ECHOCARDIOGRAM (TEE);  Surgeon: Larey Dresser, MD;  Location: [facility] ENDOSCOPY;  Service: Cardiovascular;  Laterality: N/A;  . TONSILLECTOMY    . TOTAL HIP ARTHROPLASTY Left 03/18/2013   Procedure: TOTAL HIP ARTHROPLASTY ANTERIOR APPROACH;  Surgeon: Mauri Pole, MD;  Location: [facility] ORS;  Service: Orthopedics;  Laterality: Left;  . TUBAL LIGATION        (Not in a hospital admission)  Inpatient Medications:  . atorvastatin  40 mg Oral q1800  . bisoprolol  5 mg Oral BID  . furosemide  60 mg Intravenous Once  . [START ON 12/18/2017] furosemide  40 mg Oral Daily  . levalbuterol  1.25 mg Nebulization Q6H  . losartan  12.5 mg  Oral QHS  . mexiletine  200 mg Oral Q12H  . potassium chloride SA  20 mEq Oral Daily  . Rivaroxaban  15 mg Oral Q supper  . sodium chloride flush  3 mL Intravenous Q12H  . zolpidem  5 mg Oral QHS    Allergies:  Allergies  Allergen Reactions  . Sotalol Other (See Comments)    Prolonged QTc  . Alendronate Sodium Other (See Comments)    Arm pain  . Amiodarone  Nausea Only  . Ciprofloxacin Other (See Comments)    Not effective  . Coreg [Carvedilol] Other (See Comments)    Fatigue   . Metoprolol Other (See Comments)    Profound fatigue    Social History   Socioeconomic History  . Marital status: Widowed    Spouse name: Not on file  . Number of children: Not on file  . Years of education: Not on file  . Highest education level: Not on file  Occupational History  . Not on file  Social Needs  . Financial resource strain: Not on file  . Food insecurity:    Worry: Not on file    Inability: Not on file  . Transportation needs:    Medical: Not on file    Non-medical: Not on file  Tobacco Use  . Smoking status: Never Smoker  . Smokeless tobacco: Never Used  Substance and Sexual Activity  . Alcohol use: Yes    Alcohol/week: 1.8 oz    Types: 3 Glasses of wine per week    Comment: per week  . Drug use: No  . Sexual activity: Not on file  Lifestyle  . Physical activity:    Days per week: Not on file    Minutes per session: Not on file  . Stress: Not on file  Relationships  . Social connections:    Talks on phone: Not on file    Gets together: Not on file    Attends religious service: Not on file    Active member of club or organization: Not on file    Attends meetings of clubs or organizations: Not on file    Relationship status: Not on file  . Intimate partner violence:    Fear of current or ex partner: Not on file    Emotionally abused: Not on file    Physically abused: Not on file    Forced sexual activity: Not on file  Other Topics Concern  . Not on file  Social History Narrative  . Not on file     Family History  Problem Relation Age of Onset  . Lung cancer Mother   . Heart attack Father      Review of Systems: All other systems reviewed and are otherwise negative except as noted above.  Physical Exam: Vitals:   12/17/17 1245 12/17/17 1315 12/17/17 1330 12/17/17 1345  BP: 98/67 (!) 89/66 96/71 108/62    Pulse: 87 87 86 88  Resp: 13 19 (!) 21 (!) 21  Temp:      TempSrc:      SpO2: 100% 94% 97% 98%  Weight:      Height:        GEN- The patient is elderly appearing, alert and oriented x 3 today.   HEENT: normocephalic, atraumatic; sclera clear, conjunctiva pink; hearing intact; oropharynx clear; neck supple Lungs- Clear to ausculation bilaterally, normal work of breathing.  No wheezes, rales, rhonchi Heart- Irregular rate and rhythm  GI- soft, non-tender, non-distended, bowel sounds present  Extremities- no clubbing, cyanosis, 1+ edema MS- no significant deformity or atrophy Skin- warm and dry, no rash or lesion Psych- euthymic mood, full affect Neuro- strength and sensation are intact  Labs:   Lab Results  Component Value Date   WBC 7.8 12/16/2017   HGB 15.3 (H) 12/16/2017   HCT 44.1 12/16/2017   MCV 89.5 12/16/2017   PLT 173 12/16/2017    Recent Labs  Lab 12/16/17 2111 12/17/17 0345  NA  --  138  K  --  3.2*  CL  --  100*  CO2  --  24  BUN  --  23*  CREATININE  --  1.10*  CALCIUM  --  8.8*  PROT 7.1  --   BILITOT 1.0  --   ALKPHOS 69  --   ALT 38  --   AST 48*  --   GLUCOSE  --  180*      Radiology/Studies: Dg Chest 2 View  Result Date: 12/16/2017 CLINICAL DATA:  Shortness of breath. EXAM: CHEST - 2 VIEW COMPARISON:  09/17/2017 FINDINGS: The cardio pericardial silhouette is enlarged. The lungs are clear without focal pneumonia, edema, pneumothorax or pleural effusion. Left-sided permanent pacemaker again noted. Bones are diffusely demineralized. Telemetry leads overlie the chest. IMPRESSION: No active cardiopulmonary disease. Electronically Signed   By: Misty Stanley M.D.   On: 12/16/2017 21:16    EKG:V pacing, frequent PVC's  (personally reviewed)  TELEMETRY: V pacing, frequent PVC's (personally reviewed)  DEVICE HISTORY: MDT CRTD implanted 08/2017 by Dr Rayann Heman for VT arrest and CHF; AVN ablation same time   Assessment/Plan: 1.  Permanent atrial  fibrillation S/p AVN ablation She did not previously tolerate lowering rate to 70  2.  Complete heart block Escape rate at 40bpm today Normal device function  3.  VT/PVC's No treated episodes I am not able to overdrive suppress PVC's at 90 today Interestingly, when rate turned down to assess underlying rhythm at 40, PVC's resolve.  Her effective CRT pacing percentage has dropped by about 10% in the last few days. Will try Mexiletine 200mg  bid for now for PVC suppression.  Hopefully nausea will not return.  VSR turned on today with some fusion noted Keep K>3.9, Mg >1.8  4.  NICM/MR Update echo this admission  5.  Chronic systolic heart failure Improved after diuresis   Per AHF team   Dr Lovena Le to see later today  Signed, Chanetta Marshall, NP 12/17/2017 2:11 PM  EP attending  Patient seen and examined.  Agree with the findings as noted above.  The patient has a history of chronic systolic heart failure and is status post biventricular ICD insertion after resuscitated cardiac arrest.  She has developed worsening heart failure symptoms, despite undergoing AV node ablation, and was noted to have frequent PVCs.  She is referred now for additional treatment options, specifically medication recommendation to suppress her PVCs.  The patient's ICD has been interrogated and demonstrates a marked increase in PVC burden.  In addition she has evidence of worsening heart failure.  She is denied recent ICD therapies.  Initially, the patient had been tried on amiodarone where she developed nausea and sotalol where her QT interval prolonged.  Her exam is as noted above and reflects mine as well.  We will follow the patient with you and mexiletine 200 mg daily has been recommended and initiated.  Hopefully her PVCs were suppressed on this regimen.  She would not be a particularly good candidate for VT ablation.  Mikle Bosworth.D.

## 2017-12-17 NOTE — Consult Note (Addendum)
Advanced Heart Failure Team Consult Note   Primary Physician: Lavone Orn, MD Primary Cardiologist:  [name], MD  Primary HF cardiologist: Dr Aundra Dubin  Reason for Consultation: shortness of breath  HPI:    Cheryl Vargas is seen today for evaluation of shortness of breath at the request of Dr Eliseo Squires.  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 Cardiomyopathy dates back to at least 2014 based on echoes (EF 30-35% in 2014).   Last seen in HF clinic on 11/30/17 with Dr Aundra Dubin. She was doing well. Bisoprolol changed to 5 mg BID from 10 mg daily to decrease side effects. She was Bi-V pacing 91% on device interrogation (due to PVCs). Weight was 144 lbs.   She last saw Dr Rayann Heman on 12/10/17. ICD interrogation showed 87% Bi-V pacing with PVCs. No changes were made.   She reports doing well until Friday. She felt like she was going in and out of a fib with home pulse ox showing rates 50s-110s. She also noticed an increase in abdominal tightness. No dizziness, CP, or SOB.  On Saturday, she walked 10 min on treadmill with no CP or SOB. That evening she bent over for a couple minutes to pet her cats, then stood up to move a chair and became SOB, nauseated, diaphoretic, and presyncopal. She denies dizziness/lightheadedness. No CP or palpitations. She sat down and the symptoms passed after 10 minutes. She denies orthopnea and PND that evening. Weights at home unchanged: 141-142 lbs.   Yesterday she became SOB, nauseated, and diaphoretic while walking and presented to [facility] on 12/16/17. Her symptoms are improved now. She still feels like she has a "band" of fluid across her abdomen, but feels better. She has received 40 mg IV lasix with good UOP (-650 mls). She is  compliant with all medications. She had some canned corn on Friday or Saturday, but is otherwise compliant with low salt diet and fluid restriction.   Pertinent admission labs include: K 3.8, creatinine 1.13, WBC 7.8, hemoglobin 15.3, troponin 0.05 > 0.05, BNP 971, LDL 120, CXR: no active cardiopulmonary disease  Lives alone. Not currently driving due to VT arrest 08/2017. Manages own medications. No tobacco, or drugs. Occasional alcohol use (3 glasses of wine/week).   LHC 04/2016: No significant coronary disease  Cardiac MRI 06/2016: 1.  Mildly dilated LV with EF 36%, diffuse hypokinesis. 2.  Normal RV size and systolic function. 3.  Biatrial enlargement. 4.  Moderate mitral regurgitation. 5. Non-coronary LGE pattern involving the mid-wall of the basal to mid septum and inferior wall. Possibly prior myocarditis versus a form of infiltrative disease.  Echo 08/2017 - Left ventricle: The cavity size was mildly dilated. Wall   thickness was normal. Systolic function was severely reduced. The   estimated ejection fraction was in the range of 25% to 30%.   Diffuse hypokinesis. - Ventricular septum: Septal motion showed abnormal function and   dyssynergy. - Aortic valve: Trileaflet; mildly thickened, mildly calcified   leaflets. There was mild regurgitation. - Mitral valve: There was moderate regurgitation. - Left atrium: The atrium was severely dilated. Volume/bsa, ES,   (1-plane Simpson&'s, A2C): 66.3 ml/m^2. - Right atrium: The atrium was moderately dilated. - Tricuspid valve: There was moderate regurgitation. - Pulmonary  arteries: Systolic pressure was mildly increased.  Review of Systems: [y] = yes, [ ]  = no   General: Weight gain [ ] ; Weight loss [ ] ; Anorexia [ ] ; Fatigue [ ] ; Fever [ ] ; Chills [ ] ; Weakness [ ]   Cardiac: Chest pain/pressure [ ] ; Resting SOB [ ] ; Exertional SOB [ y]; Orthopnea [ ] ; Pedal Edema [ ] ; Palpitations [ ] ; Syncope [ ] ; Presyncope [ y]; Paroxysmal  nocturnal dyspnea[ ]   Pulmonary: Cough [ ] ; Wheezing[ ] ; Hemoptysis[ ] ; Sputum [ ] ; Snoring [ ]   GI: Vomiting[ ] ; Dysphagia[ ] ; Melena[ ] ; Hematochezia [ ] ; Heartburn[ ] ; Abdominal pain [ ] ; Constipation [ ] ; Diarrhea [ ] ; BRBPR [ ]   GU: Hematuria[ ] ; Dysuria [ ] ; Nocturia[ ]   Vascular: Pain in legs with walking [ ] ; Pain in feet with lying flat [ ] ; Non-healing sores [ ] ; Stroke [ ] ; TIA [ ] ; Slurred speech [ ] ;  Neuro: Headaches[ ] ; Vertigo[ ] ; Seizures[ ] ; Paresthesias[ ] ;Blurred vision [ ] ; Diplopia [ ] ; Vision changes [ ]   Ortho/Skin: Arthritis [ y]; Joint pain Blue.Reese ]; Muscle pain [ ] ; Joint swelling [ ] ; Back Pain [ ] ; Rash [ ]   Psych: Depression[ ] ; Anxiety[ ]   Heme: Bleeding problems [ ] ; Clotting disorders [ ] ; Anemia [ ]   Endocrine: Diabetes [ ] ; Thyroid dysfunction[ ]   Home Medications Prior to Admission medications   Medication Sig Start Date End Date Taking? Authorizing Provider  acetaminophen (TYLENOL) 500 MG tablet Take 500 mg by mouth every 6 (six) hours as needed for moderate pain.   Yes [provider]  bisoprolol (ZEBETA) 10 MG tablet Take 5 mg by mouth 2 (two) times daily.   Yes [provider]  calcium carbonate (TUMS - DOSED IN MG ELEMENTAL CALCIUM) 500 MG chewable tablet Chew 1-2 tablets (200-400 mg of elemental calcium total) by mouth as needed for indigestion or heartburn. 09/09/17  Yes Allie Bossier, MD  docusate sodium (COLACE) 100 MG capsule Take 100 mg by mouth daily as needed for mild constipation.   Yes [provider]  fluticasone (FLONASE) 50 MCG/ACT nasal spray Place 1 spray into both nostrils daily as needed for allergies or rhinitis.   Yes [provider]  furosemide (LASIX) 20 MG tablet TAKE 2 TABLETS BY MOUTH DAILY . TAKE AN EXTRA TABLET DAILY AS NEEDED FOR WEIGHT GAIN OF GREATER THAN 3 POUNDS 10/24/17  Yes [provider]  losartan (COZAAR) 25 MG tablet Take ONE-HALF TABLET BY MOUTH at bedtime 11/28/17  Yes Larey Dresser, MD  naphazoline-pheniramine (NAPHCON-A) 0.025-0.3 % ophthalmic solution Place 1 drop into both eyes daily as needed (dry eyes).    Yes [provider]  potassium chloride SA (K-DUR,KLOR-CON) 20 MEQ tablet Take 1 tablet (20 mEq total) by mouth daily. 10/24/17  Yes Larey Dresser, MD  Rivaroxaban (XARELTO) 15 MG TABS tablet Take 1 tablet (15 mg total) by mouth daily with supper. RESUME ON 2 JAN 09/09/17  Yes Allie Bossier, MD  zolpidem (AMBIEN) 5 MG tablet Take 1 tablet (5 mg total) by mouth at bedtime. 09/20/17  Yes Mariel Aloe, MD    Past Medical History: Past Medical History:  Diagnosis Date  . Arthritis   . Chronic combined systolic (congestive) and diastolic (congestive) heart failure ([place])   . Mitral regurgitation   . Non-ischemic cardiomyopathy ([place])   . Persistent atrial fibrillation ([place])   . Ventricular tachycardia [facility])     Past Surgical History: Past Surgical  History:  Procedure Laterality Date  . AV NODE ABLATION N/A 09/07/2017   Procedure: AV NODE ABLATION;  Surgeon: [name], MD;  Location: [place] CV LAB;  Service: Cardiovascular;  Laterality: N/A;  . BIV ICD INSERTION CRT-D N/A 09/07/2017   Procedure: BIV ICD INSERTION CRT-D;  Surgeon: [name], MD;  Location: [place] CV LAB;  Service: Cardiovascular;  Laterality: N/A;  . BUNIONECTOMY    . CARDIAC CATHETERIZATION     in 2004 at [facility]. "Insignificant blockage" per patient  . CARDIAC CATHETERIZATION N/A 04/25/2016   Procedure: Left Heart Cath and Coronary Angiography;  Surgeon: Nelva Bush, MD;  Location: [place] CV LAB;  Service: Cardiovascular;  Laterality: N/A;  . CARDIOVERSION N/A 06/29/2017   Procedure: CARDIOVERSION;  Surgeon: Larey Dresser, MD;  Location: [facility] ENDOSCOPY;  Service: Cardiovascular;  Laterality: N/A;  . CARDIOVERSION N/A 08/28/2017   Procedure: CARDIOVERSION;  Surgeon: Larey Dresser, MD;  Location: [facility] ENDOSCOPY;  Service: Cardiovascular;   Laterality: N/A;  . SHOULDER SURGERY     closed reduction  . TEE WITHOUT CARDIOVERSION N/A 06/29/2017   Procedure: TRANSESOPHAGEAL ECHOCARDIOGRAM (TEE);  Surgeon: Larey Dresser, MD;  Location: [facility] ENDOSCOPY;  Service: Cardiovascular;  Laterality: N/A;  . TONSILLECTOMY    . TOTAL HIP ARTHROPLASTY Left 03/18/2013   Procedure: TOTAL HIP ARTHROPLASTY ANTERIOR APPROACH;  Surgeon: Mauri Pole, MD;  Location: [facility] ORS;  Service: Orthopedics;  Laterality: Left;  . TUBAL LIGATION      Family History: Family History  Problem Relation Age of Onset  . Lung cancer Mother   . Heart attack Father     Social History: Social History   Socioeconomic History  . Marital status: Widowed    Spouse name: Not on file  . Number of children: Not on file  . Years of education: Not on file  . Highest education level: Not on file  Occupational History  . Not on file  Social Needs  . Financial resource strain: Not on file  . Food insecurity:    Worry: Not on file    Inability: Not on file  . Transportation needs:    Medical: Not on file    Non-medical: Not on file  Tobacco Use  . Smoking status: Never Smoker  . Smokeless tobacco: Never Used  Substance and Sexual Activity  . Alcohol use: Yes    Alcohol/week: 1.8 oz    Types: 3 Glasses of wine per week    Comment: per week  . Drug use: No  . Sexual activity: Not on file  Lifestyle  . Physical activity:    Days per week: Not on file    Minutes per session: Not on file  . Stress: Not on file  Relationships  . Social connections:    Talks on phone: Not on file    Gets together: Not on file    Attends religious service: Not on file    Active member of club or organization: Not on file    Attends meetings of clubs or organizations: Not on file    Relationship status: Not on file  Other Topics Concern  . Not on file  Social History Narrative  . Not on file    Allergies:  Allergies  Allergen Reactions  . Sotalol Other (See Comments)     Prolonged QTc  . Alendronate Sodium Other (See Comments)    Arm pain  . Amiodarone Nausea Only  . Ciprofloxacin Other (See Comments)    Not effective  .  Coreg [Carvedilol] Other (See Comments)    Fatigue   . Metoprolol Other (See Comments)    Profound fatigue    Objective:    Vital Signs:   Temp:  [97.7 F (36.5 C)] 97.7 F (36.5 C) (04/07 2027) Pulse Rate:  [26-99] 87 (04/08 0910) Resp:  [10-34] 17 (04/08 0400) BP: (95-123)/(61-90) 96/62 (04/08 0910) SpO2:  [92 %-100 %] 99 % (04/08 0910) Weight:  [142 lb (64.4 kg)] 142 lb (64.4 kg) (04/07 2028)    Weight change: Filed Weights   12/16/17 2028  Weight: 142 lb (64.4 kg)    Intake/Output:   Intake/Output Summary (Last 24 hours) at 12/17/2017 1045 Last data filed at 12/17/2017 0151 Gross per 24 hour  Intake -  Output 650 ml  Net -650 ml      Physical Exam    General:  Elderly. No resp difficulty HEENT: normal Neck: supple. JVP to jaw with prominent CV waveforms. Carotids 2+ bilat; no bruits. No lymphadenopathy or thyromegaly appreciated. Cor: PMI nondisplaced. Irregular rhythm. No rubs, gallops or murmurs. Lungs: clear Abdomen: soft, nontender, nondistended. No hepatosplenomegaly. No bruits or masses. Good bowel sounds. Extremities: no cyanosis, clubbing, rash, edema Neuro: alert & orientedx3, cranial nerves grossly intact. moves all 4 extremities w/o difficulty. Affect pleasant   Telemetry   Afib 70-80s with PVC's. Personally reviewed.   EKG    12/16/17: Afib 111 with multifocal PVCs and IVCD. Personally reviewed.   Labs   Basic Metabolic Panel: Recent Labs  Lab 12/16/17 2034 12/17/17 0345  NA 137 138  K 3.8 3.2*  CL 103 100*  CO2 20* 24  GLUCOSE 138* 180*  BUN 27* 23*  CREATININE 1.13* 1.10*  CALCIUM 9.3 8.8*    Liver Function Tests: Recent Labs  Lab 12/16/17 2111  AST 48*  ALT 38  ALKPHOS 69  BILITOT 1.0  PROT 7.1  ALBUMIN 3.5   No results for input(s): LIPASE, AMYLASE in the last  168 hours. No results for input(s): AMMONIA in the last 168 hours.  CBC: Recent Labs  Lab 12/16/17 2034  WBC 7.8  HGB 15.3*  HCT 44.1  MCV 89.5  PLT 173    Cardiac Enzymes: Recent Labs  Lab 12/16/17 2337 12/17/17 0345  TROPONINI 0.05* 0.05*    BNP: BNP (last 3 results) Recent Labs    09/17/17 2038 12/16/17 2111  BNP 1,492.6* 971.1*    ProBNP (last 3 results) No results for input(s): PROBNP in the last 8760 hours.   CBG: No results for input(s): GLUCAP in the last 168 hours.  Coagulation Studies: No results for input(s): LABPROT, INR in the last 72 hours.   Imaging   Dg Chest 2 View  Result Date: 12/16/2017 CLINICAL DATA:  Shortness of breath. EXAM: CHEST - 2 VIEW COMPARISON:  09/17/2017 FINDINGS: The cardio pericardial silhouette is enlarged. The lungs are clear without focal pneumonia, edema, pneumothorax or pleural effusion. Left-sided permanent pacemaker again noted. Bones are diffusely demineralized. Telemetry leads overlie the chest. IMPRESSION: No active cardiopulmonary disease. Electronically Signed   By: Misty Stanley M.D.   On: 12/16/2017 21:16      Medications:     Current Medications: . atorvastatin  40 mg Oral q1800  . bisoprolol  5 mg Oral BID  . furosemide  40 mg Oral Daily  . levalbuterol  1.25 mg Nebulization Q6H  . losartan  12.5 mg Oral QHS  . potassium chloride SA  20 mEq Oral Daily  . Rivaroxaban  15 mg Oral  Q supper  . sodium chloride flush  3 mL Intravenous Q12H  . zolpidem  5 mg Oral QHS     Infusions: . 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 Cardiomyopathy dates back to at least 2014 based on echoes (EF 30-35% in 2014).   Admitted for  evaluation and treatment of SOB  Assessment/Plan   1. SOB - Likely multifactorial in the setting of Afib RVR and A/C systolic HF - Improved after receiving IV lasix overnight. She has not gotten out of bed.  - Troponin flat 0.05 > 0.05. LHC with no significant coronary disease in 04/2016. Low suspicion for ACS  2. A/C systolic HF: NICM, Unknown etiology LHC 04/2016 showed no significant coronary disease. Cardiac MRI 10/17 EF 36%, diffuse HK, normal RV, biatrial enlargement, moderate MR, non-coronary LGE pattern involving the mid-wall of the basal to mid septum and inferior wall. Possibly prior myocarditis versus a form of infiltrative disease. Echo 08/2017 EF 25-30%, diffuse HK, septal dyssynergy, moderate MR, severe LA dilation, moderate RA dilation, moderate TR - Volume status midly elevated on exam.  - Give 60 mg IV lasix x1. May be able to go back to home lasix 40 mg daily tomorrow.  - Continue bisoprolol 5 mg BID - Continue losartan 12.5 mg qHS. SBP 80-100s. No room to titrate. - Repeat echo pending  3. Chronic Afib s/p AV node ablation and [employer] CRT-D  - Allergic to amio (severe nausea) - EKG with HR 110s, now 70-80s - Will interrogate device to see how much she is bi-v pacing/AF burden.  - Continue xarelto and bisoprolol - Will consult EP  4. Moderate MR on echo 12/18 - Repeat echo pending  5. CKD, stage III - Monitor daily BMET - Creatinine 1.1  6. Hx of pulseless VT arrest 08/2017 (during DCCV, required defib + CPR 4-5 min) - Follows with Dr Rayann Heman - Will interrogate ICD.  - K 3.2. Supped by primary.  - No driving until 03/4943   Medication concerns reviewed with patient and pharmacy team. Barriers identified: none  Length of Stay: Simpson, NP 12/17/17, 10:45 AM  Advanced Heart Failure Team Pager [phone] (M-F; 7a - 4p)  Please contact [facility] for night-coverage after hours (4p -7a ) and weekends on [email or web address]  Patient seen and examined with  the above-signed Advanced Practice Provider and/or Housestaff. I personally reviewed laboratory data, imaging studies and relevant notes. I independently examined the patient and formulated the important aspects of the plan. I have edited the note to reflect any of my changes or salient points. I have personally discussed the plan with the patient and/or family.  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 Mliss Fritz for systolic HF due to NICM. She also has chronic AF and recently underwent AVN ablation and upgrade to CRT-D after VT arrest in 12/18. Had been doing well until a few days ago when developed marked worsening in exercise tolerance as well as bendopnea and ab bloating.   In ER had evidence of mild volume overload and responded well to IV lasix. ICD interrogated with EP and device rep at bedside. Shows no evidence of VT but  significant drop in BiV pacing due to increased PVC burden. Optivol fluid index looked ok. Previously intolerant to amio, sotalol and tikosyn,   Echo reviewed personally. EF 25-30% with mild to moderate RV dysfunction. Moderate TR. Mild mR.   Discussed with EP and they agree with initiation of mexilitene 200 bid. Will also diurese a bit more. Supp K.. Mg ok at 2.0  Glori Bickers, MD  8:57 PM

## 2017-12-17 NOTE — ED Notes (Signed)
Renal Diet was ordered for Lunch. 

## 2017-12-17 NOTE — ED Notes (Signed)
Admitting requesting copy of [employer] fax sent over last night at approx 2200. This RN unable to locate, per Gershon Mussel at [employer], new copy sent to fax number provided.

## 2017-12-17 NOTE — ED Notes (Signed)
Patient c/o leg cramp, provided heat packs and massage.

## 2017-12-17 NOTE — ED Notes (Signed)
Pt reports mild nausea but declined any medication at this time. Pt asking for graham crackers to help her stomach until lunch comes. Pt given graham crackers and water

## 2017-12-17 NOTE — ED Notes (Signed)
Cardiology at the bedside.

## 2017-12-17 NOTE — ED Notes (Signed)
Pt transported to echo, Levada Dy, RN aware. Will transport upstairs after echo.

## 2017-12-18 DIAGNOSIS — I130 Hypertensive heart and chronic kidney disease with heart failure and stage 1 through stage 4 chronic kidney disease, or unspecified chronic kidney disease: Secondary | ICD-10-CM | POA: Diagnosis present

## 2017-12-18 DIAGNOSIS — I5023 Acute on chronic systolic (congestive) heart failure: Secondary | ICD-10-CM | POA: Diagnosis not present

## 2017-12-18 DIAGNOSIS — E876 Hypokalemia: Secondary | ICD-10-CM | POA: Diagnosis not present

## 2017-12-18 DIAGNOSIS — R110 Nausea: Secondary | ICD-10-CM | POA: Diagnosis present

## 2017-12-18 DIAGNOSIS — I081 Rheumatic disorders of both mitral and tricuspid valves: Secondary | ICD-10-CM | POA: Diagnosis present

## 2017-12-18 DIAGNOSIS — I10 Essential (primary) hypertension: Secondary | ICD-10-CM | POA: Diagnosis not present

## 2017-12-18 DIAGNOSIS — Z79899 Other long term (current) drug therapy: Secondary | ICD-10-CM | POA: Diagnosis not present

## 2017-12-18 DIAGNOSIS — I5043 Acute on chronic combined systolic (congestive) and diastolic (congestive) heart failure: Secondary | ICD-10-CM | POA: Diagnosis present

## 2017-12-18 DIAGNOSIS — I509 Heart failure, unspecified: Secondary | ICD-10-CM

## 2017-12-18 DIAGNOSIS — I493 Ventricular premature depolarization: Secondary | ICD-10-CM | POA: Diagnosis not present

## 2017-12-18 DIAGNOSIS — Z7901 Long term (current) use of anticoagulants: Secondary | ICD-10-CM | POA: Diagnosis not present

## 2017-12-18 DIAGNOSIS — I442 Atrioventricular block, complete: Secondary | ICD-10-CM | POA: Diagnosis present

## 2017-12-18 DIAGNOSIS — I4891 Unspecified atrial fibrillation: Secondary | ICD-10-CM | POA: Diagnosis not present

## 2017-12-18 DIAGNOSIS — R0602 Shortness of breath: Secondary | ICD-10-CM | POA: Diagnosis present

## 2017-12-18 DIAGNOSIS — I481 Persistent atrial fibrillation: Secondary | ICD-10-CM | POA: Diagnosis not present

## 2017-12-18 DIAGNOSIS — Z95810 Presence of automatic (implantable) cardiac defibrillator: Secondary | ICD-10-CM | POA: Diagnosis not present

## 2017-12-18 DIAGNOSIS — Z8674 Personal history of sudden cardiac arrest: Secondary | ICD-10-CM | POA: Diagnosis not present

## 2017-12-18 DIAGNOSIS — N183 Chronic kidney disease, stage 3 (moderate): Secondary | ICD-10-CM | POA: Diagnosis present

## 2017-12-18 DIAGNOSIS — Z8249 Family history of ischemic heart disease and other diseases of the circulatory system: Secondary | ICD-10-CM | POA: Diagnosis not present

## 2017-12-18 DIAGNOSIS — Z96642 Presence of left artificial hip joint: Secondary | ICD-10-CM | POA: Diagnosis present

## 2017-12-18 DIAGNOSIS — Z801 Family history of malignant neoplasm of trachea, bronchus and lung: Secondary | ICD-10-CM | POA: Diagnosis not present

## 2017-12-18 DIAGNOSIS — E785 Hyperlipidemia, unspecified: Secondary | ICD-10-CM | POA: Diagnosis present

## 2017-12-18 DIAGNOSIS — K5900 Constipation, unspecified: Secondary | ICD-10-CM | POA: Diagnosis present

## 2017-12-18 DIAGNOSIS — I255 Ischemic cardiomyopathy: Secondary | ICD-10-CM | POA: Diagnosis present

## 2017-12-18 DIAGNOSIS — I482 Chronic atrial fibrillation: Secondary | ICD-10-CM | POA: Diagnosis present

## 2017-12-18 LAB — CBC
HCT: 42.4 % (ref 36.0–46.0)
Hemoglobin: 14.3 g/dL (ref 12.0–15.0)
MCH: 30.8 pg (ref 26.0–34.0)
MCHC: 33.7 g/dL (ref 30.0–36.0)
MCV: 91.4 fL (ref 78.0–100.0)
Platelets: 159 10*3/uL (ref 150–400)
RBC: 4.64 MIL/uL (ref 3.87–5.11)
RDW: 13.9 % (ref 11.5–15.5)
WBC: 6 10*3/uL (ref 4.0–10.5)

## 2017-12-18 LAB — BASIC METABOLIC PANEL
Anion gap: 8 (ref 5–15)
BUN: 28 mg/dL — ABNORMAL HIGH (ref 6–20)
CO2: 24 mmol/L (ref 22–32)
Calcium: 8.8 mg/dL — ABNORMAL LOW (ref 8.9–10.3)
Chloride: 106 mmol/L (ref 101–111)
Creatinine, Ser: 1.08 mg/dL — ABNORMAL HIGH (ref 0.44–1.00)
GFR calc Af Amer: 55 mL/min — ABNORMAL LOW (ref >60)
GFR calc non Af Amer: 47 mL/min — ABNORMAL LOW (ref >60)
Glucose, Bld: 103 mg/dL — ABNORMAL HIGH (ref 65–99)
Potassium: 4.6 mmol/L (ref 3.5–5.1)
Sodium: 138 mmol/L (ref 135–145)

## 2017-12-18 MED ORDER — MEXILETINE HCL 250 MG PO CAPS: 250.0000 mg | ORAL_CAPSULE | Freq: Two times a day (BID) | ORAL | Status: DC

## 2017-12-18 MED ORDER — MEXILETINE HCL 150 MG PO CAPS
300.0000 mg | ORAL_CAPSULE | Freq: Two times a day (BID) | ORAL | Status: DC
  Administered 2017-12-18: 300 mg via ORAL
  Filled 2017-12-18 (×2): qty 2

## 2017-12-18 MED ORDER — SENNA 8.6 MG PO TABS
1.0000 | ORAL_TABLET | Freq: Every evening | ORAL | Status: DC | PRN
Start: 2017-12-18 — End: 2017-12-21
  Administered 2017-12-18: 8.6 mg via ORAL
  Filled 2017-12-18: qty 1

## 2017-12-18 MED ORDER — POLYETHYLENE GLYCOL 3350 17 G PO PACK: 17.0000 g | PACK | Freq: Every day | ORAL | Status: DC | PRN

## 2017-12-18 MED ORDER — ONDANSETRON HCL 4 MG/2ML IJ SOLN
4.0000 mg | Freq: Four times a day (QID) | INTRAMUSCULAR | Status: DC | PRN
Start: 2017-12-18 — End: 2017-12-21
  Administered 2017-12-18 – 2017-12-20 (×3): 4 mg via INTRAVENOUS
  Filled 2017-12-18 (×3): qty 2

## 2017-12-18 MED ORDER — FUROSEMIDE 10 MG/ML IJ SOLN: 40.0000 mg | Freq: Once | INTRAMUSCULAR | Status: DC

## 2017-12-18 MED ORDER — FUROSEMIDE 10 MG/ML IJ SOLN
60.0000 mg | Freq: Two times a day (BID) | INTRAMUSCULAR | Status: DC
  Administered 2017-12-18 – 2017-12-19 (×2): 60 mg via INTRAVENOUS
  Filled 2017-12-18 (×2): qty 6

## 2017-12-18 NOTE — Progress Notes (Addendum)
Advanced Heart Failure Rounding Note  PCP-Cardiologist: [name], MD   Subjective:    Minimal UOP charted. Weight unchanged. BMET pending.   Echo 12/17/17: EF 25-30%, severe diffuse HK, mild AI, mild MR, mod systolic dysfunction RV, LA dilation, RA dilation, moderate TR  Vpacing with frequent PVCs (11-24/min).  Feels good this am. Says she urinated a lot yesterday and she no longer feels bloated. She feels like the Mexiletine is working. No CP, SOB, presyncope, or palpitations. Ambulating in room without difficulty. She is hopeful to go home today.    Objective:   Weight Range: 142 lb 4.8 oz (64.5 kg) Body mass index is 23.68 kg/m.   Vital Signs:   Temp:  [97.7 F (36.5 C)-98.1 F (36.7 C)] 98.1 F (36.7 C) (04/09 0655) Pulse Rate:  [84-94] 84 (04/09 0655) Resp:  [12-23] 16 (04/08 2141) BP: (84-114)/(55-79) 102/55 (04/09 0655) SpO2:  [94 %-100 %] 95 % (04/09 0655) Weight:  [142 lb 4.8 oz (64.5 kg)-142 lb 14.4 oz (64.8 kg)] 142 lb 4.8 oz (64.5 kg) (04/09 0656) Last BM Date: 12/16/17  Weight change: Filed Weights   12/16/17 2028 12/17/17 1525 12/18/17 0656  Weight: 142 lb (64.4 kg) 142 lb 14.4 oz (64.8 kg) 142 lb 4.8 oz (64.5 kg)    Intake/Output:   Intake/Output Summary (Last 24 hours) at 12/18/2017 0717 Last data filed at 12/18/2017 0600 Gross per 24 hour  Intake 248 ml  Output 150 ml  Net 98 ml      Physical Exam    General:  Elderly No resp difficulty HEENT: Normal Neck: Supple. JVP 12. Carotids 2+ bilat; no bruits. No lymphadenopathy or thyromegaly appreciated. Cor: PMI nondisplaced. RRR with ectopic beats. No rubs, gallops or murmurs. Lungs: Clear Abdomen: Soft, nontender, nondistended. No hepatosplenomegaly. No bruits or masses. Good bowel sounds. Extremities: No cyanosis, clubbing, rash, edema Neuro: Alert & orientedx3, cranial nerves grossly intact. moves all 4 extremities w/o difficulty. Affect pleasant   Telemetry   Vpaced 80s with frequent  PVCs (11-24/min). Frequently in trigeminy. Personally reviewed.   EKG    No new tracings.   Labs    CBC Recent Labs    12/16/17 2034  WBC 7.8  HGB 15.3*  HCT 44.1  MCV 89.5  PLT 633   Basic Metabolic Panel Recent Labs    12/16/17 2034 12/17/17 0345 12/17/17 1155  NA 137 138  --   K 3.8 3.2*  --   CL 103 100*  --   CO2 20* 24  --   GLUCOSE 138* 180*  --   BUN 27* 23*  --   CREATININE 1.13* 1.10*  --   CALCIUM 9.3 8.8*  --   MG  --   --  2.0   Liver Function Tests Recent Labs    12/16/17 2111  AST 48*  ALT 38  ALKPHOS 69  BILITOT 1.0  PROT 7.1  ALBUMIN 3.5   No results for input(s): LIPASE, AMYLASE in the last 72 hours. Cardiac Enzymes Recent Labs    12/16/17 2337 12/17/17 0345 12/17/17 1155  TROPONINI 0.05* 0.05* 0.04*    BNP: BNP (last 3 results) Recent Labs    09/17/17 2038 12/16/17 2111  BNP 1,492.6* 971.1*    ProBNP (last 3 results) No results for input(s): PROBNP in the last 8760 hours.   D-Dimer No results for input(s): DDIMER in the last 72 hours. Hemoglobin A1C Recent Labs    12/17/17 0345  HGBA1C 5.6   Fasting  Lipid Panel Recent Labs    12/17/17 0345  CHOL 176  HDL 46  LDLCALC 120*  TRIG 52  CHOLHDL 3.8   Thyroid Function Tests No results for input(s): TSH, T4TOTAL, T3FREE, THYROIDAB in the last 72 hours.  Invalid input(s): FREET3  Other results:   Imaging     No results found.   Medications:     Scheduled Medications: . atorvastatin  40 mg Oral q1800  . bisoprolol  5 mg Oral BID  . furosemide  60 mg Intravenous Once  . furosemide  40 mg Oral Daily  . losartan  12.5 mg Oral QHS  . mexiletine  200 mg Oral Q12H  . potassium chloride SA  20 mEq Oral Daily  . Rivaroxaban  15 mg Oral Q supper  . sodium chloride flush  3 mL Intravenous Q12H  . zolpidem  5 mg Oral QHS     Infusions: . 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 Cardiomyopathy dates back to at least 2014 based on echoes (EF 30-35% in 2014).   Admitted for evaluation and treatment of SOB  Assessment/Plan   1. SOB - Likely multifactorial in the setting of Afib RVR and A/C systolic HF  - Troponin flat 0.05 > 0.05 > 0.04. LHC with no significant coronary disease in 04/2016. Low suspicion for ACS - No longer feeling SOB. On RA.   2. A/C systolic HF: NICM, Unknown etiology LHC 04/2016 showed no significant coronary disease. Cardiac MRI 10/17 EF 36%, diffuse HK, normal RV, biatrial enlargement, moderate MR, non-coronary LGE pattern involving the mid-wall of the basal to mid septum and inferior wall. Possibly prior myocarditis versus a form of infiltrative disease. Echo 08/2017 EF 25-30%, diffuse HK, septal dyssynergy, moderate MR, severe LA dilation, moderate RA dilation, moderate TR - Echo 12/17/17: EF 25-30%, severe diffuse HK, mild AI, mild MR, mod systolic dysfunction RV, LA dilation, RA dilation, moderate TR - Still volume overloaded.  Lasix 60 mg IV bid.  - Continue bisoprolol 5 mg BID - Continue losartan 12.5 mg qHS. SBP 90-100s. No room to titrate.  3. Chronic Afib s/p AV node ablation and Medtronic CRT-D  - Intolerant to amio, sotolol, and tikosyn - Vpaced 80s with frequent PVCs on tele. - ICD interrogation showed no VT, but significant drop in BiV pacing (78%) due to PVC burden over the last few days.  - Continue xarelto and bisoprolol - EP following. Started on mexiletine 200 mg BID.   4. Moderate MR on echo 12/18 - Echo 12/17/17: mild MR directed  centrally  5. CKD, stage III - Monitor daily BMET - BMET pending this am.   6. Hx of pulseless VT arrest 08/2017 (during DCCV, required defib + CPR 4-5 min) - Follows with Dr Rayann Heman - No VT on ICD interrogation.  - No driving until 09/5174  Medication concerns reviewed with patient and pharmacy team. Barriers identified: none   Length of Stay: [place], NP  12/18/2017, 7:17 AM  Advanced Heart Failure Team Pager [phone] (M-F; 7a - 4p)  Please  contact [facility] for night-coverage after hours (4p -7a ) and weekends on [email or web address]  Patient seen with NP, agree with the above note.    Overall feels better, still with some exertional dyspnea.  On exam, regular rhythm with PVCs.  JVP 12 cm.  No edema.  K/creatinine stable. Telemetry with v-pacing, PVCs.   She is now on mexiletine at 200 mg bid, still with frequent PVCs.  She has tolerated it well so far.  - Increase mexiletine to 300 mg bid.  - Continue bisoprolol.  - Probably not a good PVC ablation candidate per EP.  - Need to limit PVCs as much as possible due to concern for worsening cardiomyopathy, and also the PVCs decrease BiV pacing.   Echo shows persistently low EF 25-30%.  She remains volume overloaded on exam.   - She got Lasix 40 mg po this morning.  Change to Lasix 60 mg IV bid, start this afternoon. - Continue bisoprolol, losartan.  Soft BP makes medication titration difficult.   [name] 12/18/2017 1:33 PM

## 2017-12-18 NOTE — Care Management Note (Signed)
Case Management Note  Patient Details  Name: Cheryl Vargas MRN: [id number] Date of Birth: 05/24/1935  Subjective/Objective:  CHF                 Action/Plan: Patient lives at home in a Sr Living apts; PCP: Lavone Orn, MD; has private insurance with [employer] with prescription drug coverage; pharmacy of choice is [facility]; patient states that her friends takes her to scheduled apts and erands; DME - [employer]ater at home; she was active with [facility] for [facility] services in the past and does not want any HHC at this time. She wants to know the difference between Outpatient Cardiac Rehab vs going to the fitness center at her apt complex; consult placed for someone from Cardiac rehab to talk to her.    Expected Discharge Date:     Possibly 12/19/2017             Expected Discharge Plan:  [place]  In-House Referral:   [facility]  Discharge planning Services  CM Consult  Post Acute Care Choice:    Choice offered to:  Patient  HH Arranged:  Patient Refused HH  Status of Service:  In process, will continue to follow  Sherrilyn Rist [phone] 12/18/2017, 11:30 AM

## 2017-12-18 NOTE — Discharge Instructions (Signed)
Information on my medicine - XARELTO (Rivaroxaban)  This medication education was reviewed with me or my healthcare representative as part of my discharge preparation.  The pharmacist that spoke with me during my hospital stay was:  Andee Poles, Student-PharmD  Why was Xarelto prescribed for you? Xarelto was prescribed for you to reduce the risk of a blood clot forming that can cause a stroke if you have a medical condition called atrial fibrillation (a type of irregular heartbeat).  What do you need to know about xarelto ? Take your Xarelto ONCE DAILY at the same time every day with your evening meal. If you have difficulty swallowing the tablet whole, you may crush it and mix in applesauce just prior to taking your dose.  Take Xarelto exactly as prescribed by your doctor and DO NOT stop taking Xarelto without talking to the doctor who prescribed the medication.  Stopping without other stroke prevention medication to take the place of Xarelto may increase your risk of developing a clot that causes a stroke.  Refill your prescription before you run out.  After discharge, you should have regular check-up appointments with your healthcare provider that is prescribing your Xarelto.  In the future your dose may need to be changed if your kidney function or weight changes by a significant amount.  What do you do if you miss a dose? If you are taking Xarelto ONCE DAILY and you miss a dose, take it as soon as you remember on the same day then continue your regularly scheduled once daily regimen the next day. Do not take two doses of Xarelto at the same time or on the same day.   Important Safety Information A possible side effect of Xarelto is bleeding. You should call your healthcare provider right away if you experience any of the following: ? Bleeding from an injury or your nose that does not stop. ? Unusual colored urine (red or dark brown) or unusual colored stools (red or  black). ? Unusual bruising for unknown reasons. ? A serious fall or if you hit your head (even if there is no bleeding).  Some medicines may interact with Xarelto and might increase your risk of bleeding while on Xarelto. To help avoid this, consult your healthcare provider or pharmacist prior to using any new prescription or non-prescription medications, including herbals, vitamins, non-steroidal anti-inflammatory drugs (NSAIDs) and supplements.  This website has more information on Xarelto: [email or web address]

## 2017-12-18 NOTE — Progress Notes (Signed)
Pt in bed, feeling nauseated. Sts she walked with [place], mobility specialist earlier. He sts she had increased PVCs with walking and was quite unsteady/weak. Pt wants to exercise at home but at this time she is not ready given episodes of increased PVCs with increased activity. Encouraged her to discuss further with MD. I will refer her to [place] again as hopefully she can drive in June and will be able to do program by then. Left HF booklet and low sodium diets. Pt very receptive and motivated.  [place], ACSM 3:07 PM 12/18/2017

## 2017-12-18 NOTE — Progress Notes (Signed)
PROGRESS NOTE    Cheryl Vargas  WRU:[id number] DOB: 1934/09/20 DOA: 12/16/2017 PCP: Lavone Orn, MD   Outpatient Specialists: Zada Finders    Brief Narrative:  Cheryl Vargas is a 83 y.o. female with medical history significant of sCHF with EF 25%, V. Tach, a fib on Xarelto, mitral regurgitation, cardiac arrest due to completed heart block, ICD placement, CKD-3, hyperlipidemia, who presents with shortness breath.  Pt states that she has had several episodes of shortness of breath since last night. Initial episode happened when she was carrying a chair last night. It lasted for about 10-15 min and resolved after she had a rest. Tonight she had similar symptoms again. She he does not have chest pain, cough, fever or chills. She felt nauseated when she had shortness of breath. No vomiting, diarrhea or abdominal pain and no symptoms of UTI. Denies unilateral weakness. She denies any increasing peripheral edema, but she states that she carries her weight in her abdomen and she does feel like her abdominal girth is increasing.  She did have some canned corn as she is unable to drive and get fresh vegetables    Assessment & Plan:   Principal Problem:   SOB (shortness of breath) Active Problems:   Frequent PVCs   Hypertension   Acute on chronic systolic heart failure (HCC)   A-fib (HCC)   Systolic and diastolic CHF, acute (HCC)   Severe mitral valve regurgitation   CKD (chronic kidney disease), stage III (HCC)   SOB due to acute systolic CHF -cardiology charged lasix back to IV-- will need to watch BP closely -2d echo: 25-30% -low sat diet -cardiology consult appreciated: Need to limit PVCs as much as possible due to concern for worsening cardiomyopathy, and also the PVCs decrease BiV pacing.  -mexiletine started and has been titrated up as not a good response yet   Hypokalemia -repleted -Mg ok  Atrial Fibrillation:  -continue Xarelto and zebeta  CKD (chronic kidney  disease), stage III ([place]): -follow closely with diuresis    DVT prophylaxis:  Fully anticoagulated   Code Status: Full Code   Family Communication:   Disposition Plan:     Consultants:  cards  Subjective: No complaints this AM-- walked at a slow pace up and down the hall  Objective: Vitals:   12/18/17 0800 12/18/17 1055 12/18/17 1100 12/18/17 1153  BP: (!) 88/56 110/80 109/71 107/67  Pulse:    85  Resp:    20  Temp:    97.6 F (36.4 C)  TempSrc:    Oral  SpO2:    96%  Weight:      Height:        Intake/Output Summary (Last 24 hours) at 12/18/2017 1403 Last data filed at 12/18/2017 1300 Gross per 24 hour  Intake 668 ml  Output 350 ml  Net 318 ml   Filed Weights   12/16/17 2028 12/17/17 1525 12/18/17 0656  Weight: 64.4 kg (142 lb) 64.8 kg (142 lb 14.4 oz) 64.5 kg (142 lb 4.8 oz)    Examination:  General exam: in bed, NAD, + JVD Respiratory system: no wheezing nor increased work of breathing. Cardiovascular system: paced, rrr Gastrointestinal system: +BS, soft Central nervous system: alert+Oriented Extremities: moves all 4 ext Skin: no rashes Psychiatry: mood normal    Data Reviewed: I have personally reviewed following labs and imaging studies  CBC: Recent Labs  Lab 12/16/17 2034 12/18/17 0733  WBC 7.8 6.0  HGB 15.3* 14.3  HCT 44.1 42.4  MCV 89.5 91.4  PLT 173 671   Basic Metabolic Panel: Recent Labs  Lab 12/16/17 2034 12/17/17 0345 12/17/17 1155 12/18/17 0733  NA 137 138  --  138  K 3.8 3.2*  --  4.6  CL 103 100*  --  106  CO2 20* 24  --  24  GLUCOSE 138* 180*  --  103*  BUN 27* 23*  --  28*  CREATININE 1.13* 1.10*  --  1.08*  CALCIUM 9.3 8.8*  --  8.8*  MG  --   --  2.0  --    GFR: Estimated Creatinine Clearance: 37.4 mL/min (A) (by C-G formula based on SCr of 1.08 mg/dL (H)). Liver Function Tests: Recent Labs  Lab 12/16/17 2111  AST 48*  ALT 38  ALKPHOS 69  BILITOT 1.0  PROT 7.1  ALBUMIN 3.5   No results for  input(s): LIPASE, AMYLASE in the last 168 hours. No results for input(s): AMMONIA in the last 168 hours. Coagulation Profile: No results for input(s): INR, PROTIME in the last 168 hours. Cardiac Enzymes: Recent Labs  Lab 12/16/17 2337 12/17/17 0345 12/17/17 1155  TROPONINI 0.05* 0.05* 0.04*   BNP (last 3 results) No results for input(s): PROBNP in the last 8760 hours. HbA1C: Recent Labs    12/17/17 0345  HGBA1C 5.6   CBG: No results for input(s): GLUCAP in the last 168 hours. Lipid Profile: Recent Labs    12/17/17 0345  CHOL 176  HDL 46  LDLCALC 120*  TRIG 52  CHOLHDL 3.8   Thyroid Function Tests: No results for input(s): TSH, T4TOTAL, FREET4, T3FREE, THYROIDAB in the last 72 hours. Anemia Panel: No results for input(s): VITAMINB12, FOLATE, FERRITIN, TIBC, IRON, RETICCTPCT in the last 72 hours. Urine analysis:    Component Value Date/Time   COLORURINE AMBER (A) 09/17/2017 1938   APPEARANCEUR HAZY (A) 09/17/2017 1938   LABSPEC 1.026 09/17/2017 1938   PHURINE 5.0 09/17/2017 1938   GLUCOSEU NEGATIVE 09/17/2017 1938   HGBUR MODERATE (A) 09/17/2017 1938   BILIRUBINUR NEGATIVE 09/17/2017 1938   KETONESUR NEGATIVE 09/17/2017 1938   PROTEINUR 30 (A) 09/17/2017 1938   UROBILINOGEN 0.2 03/17/2013 1423   NITRITE NEGATIVE 09/17/2017 1938   LEUKOCYTESUR SMALL (A) 09/17/2017 1938     )No results found for this or any previous visit (from the past 240 hour(s)).    Anti-infectives (From admission, onward)   None       Radiology Studies: Dg Chest 2 View  Result Date: 12/16/2017 CLINICAL DATA:  Shortness of breath. EXAM: CHEST - 2 VIEW COMPARISON:  09/17/2017 FINDINGS: The cardio pericardial silhouette is enlarged. The lungs are clear without focal pneumonia, edema, pneumothorax or pleural effusion. Left-sided permanent pacemaker again noted. Bones are diffusely demineralized. Telemetry leads overlie the chest. IMPRESSION: No active cardiopulmonary disease.  Electronically Signed   By: Misty Stanley M.D.   On: 12/16/2017 21:16        Scheduled Meds: . atorvastatin  40 mg Oral q1800  . bisoprolol  5 mg Oral BID  . furosemide  60 mg Intravenous Once  . furosemide  60 mg Intravenous BID  . losartan  12.5 mg Oral QHS  . mexiletine  300 mg Oral Q12H  . potassium chloride SA  20 mEq Oral Daily  . Rivaroxaban  15 mg Oral Q supper  . sodium chloride flush  3 mL Intravenous Q12H  . zolpidem  5 mg Oral QHS   Continuous Infusions: . sodium chloride  LOS: 0 days    Time spent: 25 min    Geradine Girt, DO [facility] Pager [phone]  If 7PM-7AM, please contact night-coverage [email or web address] Password [email or web address] 12/18/2017, 2:03 PM

## 2017-12-18 NOTE — Progress Notes (Signed)
Paged Dr Eliseo Squires re: bp 88/56 manual, pt denies cp sob or dizziness, she said to hold am meds and until discussion with Cardiology

## 2017-12-19 ENCOUNTER — Inpatient Hospital Stay (HOSPITAL_COMMUNITY): Payer: PPO

## 2017-12-19 LAB — BASIC METABOLIC PANEL
Anion gap: 12 (ref 5–15)
BUN: 30 mg/dL — ABNORMAL HIGH (ref 6–20)
CO2: 26 mmol/L (ref 22–32)
Calcium: 8.7 mg/dL — ABNORMAL LOW (ref 8.9–10.3)
Chloride: 100 mmol/L — ABNORMAL LOW (ref 101–111)
Creatinine, Ser: 1.17 mg/dL — ABNORMAL HIGH (ref 0.44–1.00)
GFR calc Af Amer: 50 mL/min — ABNORMAL LOW (ref >60)
GFR calc non Af Amer: 43 mL/min — ABNORMAL LOW (ref >60)
Glucose, Bld: 88 mg/dL (ref 65–99)
Potassium: 3.6 mmol/L (ref 3.5–5.1)
Sodium: 138 mmol/L (ref 135–145)

## 2017-12-19 MED ORDER — FUROSEMIDE 40 MG PO TABS: 60.0000 mg | ORAL_TABLET | Freq: Every day | ORAL | Status: DC

## 2017-12-19 MED ORDER — FLEET ENEMA 7-19 GM/118ML RE ENEM
1.0000 | ENEMA | Freq: Every day | RECTAL | Status: DC | PRN
  Administered 2017-12-19: 1 via RECTAL
  Filled 2017-12-19: qty 1

## 2017-12-19 MED ORDER — MEXILETINE HCL 200 MG PO CAPS
200.0000 mg | ORAL_CAPSULE | Freq: Three times a day (TID) | ORAL | Status: DC
  Administered 2017-12-19 – 2017-12-20 (×4): 200 mg via ORAL
  Filled 2017-12-19 (×4): qty 1

## 2017-12-19 MED ORDER — RANOLAZINE ER 500 MG PO TB12
500.0000 mg | ORAL_TABLET | Freq: Two times a day (BID) | ORAL | Status: DC
  Administered 2017-12-19 – 2017-12-21 (×4): 500 mg via ORAL
  Filled 2017-12-19 (×5): qty 1

## 2017-12-19 MED ORDER — POTASSIUM CHLORIDE CRYS ER 20 MEQ PO TBCR
40.0000 meq | EXTENDED_RELEASE_TABLET | Freq: Once | ORAL | Status: AC
  Administered 2017-12-19: 40 meq via ORAL
  Filled 2017-12-19: qty 2

## 2017-12-19 MED ORDER — PROMETHAZINE HCL 25 MG/ML IJ SOLN
6.2500 mg | Freq: Once | INTRAMUSCULAR | Status: AC | PRN
  Administered 2017-12-19: 6.25 mg via INTRAVENOUS
  Filled 2017-12-19: qty 1

## 2017-12-19 NOTE — Progress Notes (Addendum)
Advanced Heart Failure Rounding Note  PCP-Cardiologist: [name], MD   Subjective:    Decent UOP (-1.2 L) with 60 mg IV lasix. Weight down 3 lbs. BMET pending.   Echo 12/17/17: EF 25-30%, severe diffuse HK, mild AI, mild MR, mod systolic dysfunction RV, LA dilation, RA dilation, moderate TR  V-pacing with trigeminy.   No CP, SOB, dizziness. Had a brief episode of nausea yesterday, but otherwise tolerating mexilitine. Ambulated with no problems.    Objective:   Weight Range: 139 lb 14.4 oz (63.5 kg) Body mass index is 23.28 kg/m.   Vital Signs:   Temp:  [97.5 F (36.4 C)-97.8 F (36.6 C)] 97.8 F (36.6 C) (04/10 0553) Pulse Rate:  [78-85] 78 (04/10 0553) Resp:  [20] 20 (04/09 1153) BP: (88-119)/(56-80) 119/74 (04/10 0553) SpO2:  [95 %-98 %] 98 % (04/10 0553) Weight:  [139 lb 14.4 oz (63.5 kg)] 139 lb 14.4 oz (63.5 kg) (04/10 0556) Last BM Date: 12/16/17  Weight change: Filed Weights   12/17/17 1525 12/18/17 0656 12/19/17 0556  Weight: 142 lb 14.4 oz (64.8 kg) 142 lb 4.8 oz (64.5 kg) 139 lb 14.4 oz (63.5 kg)    Intake/Output:   Intake/Output Summary (Last 24 hours) at 12/19/2017 0731 Last data filed at 12/19/2017 5784 Gross per 24 hour  Intake 1023 ml  Output 2200 ml  Net -1177 ml      Physical Exam    General: Elderly. No resp difficulty. HEENT: Normal Neck: Supple. JVP 8. Carotids 2+ bilat; no bruits. No thyromegaly or nodule noted. Cor: PMI nondisplaced. RRR with ectopic beats, No M/G/R noted Lungs: CTAB, normal effort. Abdomen: Soft, non-tender, non-distended, no HSM. No bruits or masses. +BS  Extremities: No cyanosis, clubbing, or rash. R and LLE no edema.  Neuro: Alert & orientedx3, cranial nerves grossly intact. moves all 4 extremities w/o difficulty. Affect pleasant   Telemetry   Vpacing 80 with trigeminy. Personally reviewed.   EKG    No new tracings.   Labs    CBC Recent Labs    12/16/17 2034 12/18/17 0733  WBC 7.8 6.0  HGB  15.3* 14.3  HCT 44.1 42.4  MCV 89.5 91.4  PLT 173 696   Basic Metabolic Panel Recent Labs    12/17/17 0345 12/17/17 1155 12/18/17 0733  NA 138  --  138  K 3.2*  --  4.6  CL 100*  --  106  CO2 24  --  24  GLUCOSE 180*  --  103*  BUN 23*  --  28*  CREATININE 1.10*  --  1.08*  CALCIUM 8.8*  --  8.8*  MG  --  2.0  --    Liver Function Tests Recent Labs    12/16/17 2111  AST 48*  ALT 38  ALKPHOS 69  BILITOT 1.0  PROT 7.1  ALBUMIN 3.5   No results for input(s): LIPASE, AMYLASE in the last 72 hours. Cardiac Enzymes Recent Labs    12/16/17 2337 12/17/17 0345 12/17/17 1155  TROPONINI 0.05* 0.05* 0.04*    BNP: BNP (last 3 results) Recent Labs    09/17/17 2038 12/16/17 2111  BNP 1,492.6* 971.1*    ProBNP (last 3 results) No results for input(s): PROBNP in the last 8760 hours.   D-Dimer No results for input(s): DDIMER in the last 72 hours. Hemoglobin A1C Recent Labs    12/17/17 0345  HGBA1C 5.6   Fasting Lipid Panel Recent Labs    12/17/17 0345  CHOL 176  HDL 46  LDLCALC 120*  TRIG 52  CHOLHDL 3.8   Thyroid Function Tests No results for input(s): TSH, T4TOTAL, T3FREE, THYROIDAB in the last 72 hours.  Invalid input(s): FREET3  Other results:   Imaging    No results found.   Medications:     Scheduled Medications: . atorvastatin  40 mg Oral q1800  . bisoprolol  5 mg Oral BID  . furosemide  60 mg Intravenous Once  . furosemide  60 mg Intravenous BID  . losartan  12.5 mg Oral QHS  . mexiletine  300 mg Oral Q12H  . potassium chloride SA  20 mEq Oral Daily  . Rivaroxaban  15 mg Oral Q supper  . sodium chloride flush  3 mL Intravenous Q12H  . zolpidem  5 mg Oral QHS    Infusions: . 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 Cardiomyopathy dates back to at least 2014 based on echoes (EF 30-35% in 2014).   Admitted for evaluation and treatment of SOB  Assessment/Plan   1. SOB - Likely multifactorial in the setting of Afib RVR and A/C systolic HF  - Troponin flat 0.05 > 0.05 > 0.04. LHC with no significant coronary disease in 04/2016. Low suspicion for ACS - Resolved. On RA.   2. A/C systolic HF: NICM, Unknown etiology LHC 04/2016 showed no significant coronary disease. Cardiac MRI 10/17 EF 36%, diffuse HK, normal RV, biatrial enlargement, moderate MR, non-coronary LGE pattern involving the mid-wall of the basal to mid septum and inferior wall. Possibly prior myocarditis versus a form of infiltrative disease. Echo 08/2017 EF 25-30%, diffuse HK, septal dyssynergy, moderate MR, severe LA dilation, moderate RA dilation, moderate TR - Echo 12/17/17: EF 25-30%, severe diffuse HK, mild AI, mild MR, mod systolic dysfunction RV, LA dilation, RA dilation, moderate TR - Volume looks better, will give Lasix 60 mg IV this morning then switch to po for tomorrow.  - Continue bisoprolol 5 mg BID - Continue losartan 12.5 mg qHS. SBP 90-100s. No room to titrate. - Plans to go to cardiac rehab outpatient once she can drive in June  3. Chronic Afib s/p AV node ablation and Medtronic CRT-D: Now with frequent PVCs impairing BiV pacing.  - Cannot tolerate amio, sotolol, or tikosyn - V-paced 80s with trigeminy on tele.  - ICD interrogation 4/8 showed no VT, but significant drop in BiV pacing (78%) due to PVC burden over the last few days.  - Continue xarelto and bisoprolol - Probably not a good PVC ablation candidate per  EP.  - Need to limit PVCs as much as possible due to concern for worsening cardiomyopathy, and also the PVCs decrease BiV pacing.  - Increased mexilitine to 300 mg BID yesterday. PVC burden has not significantly changed- still in trigeminy. She had a brief episode of nausea yesterday, but otherwise tolerating.  - Spoke with EP - Recommended changing mexilitine to 200 mg TID to decrease side effects. They will see her again today.  4. Moderate MR on echo 12/18 - Echo 12/17/17: mild  MR directed centrally. No change  5. CKD, stage III - Monitor daily BMET - BMET pending. Stable creatinine 1.03 yesterday  6. Hx of pulseless VT arrest 08/2017 (during DCCV, required defib + CPR 4-5 min) - Follows with Dr Rayann Heman - No VT on ICD interrogation.  - No driving until 10/6413. No change  Medication concerns reviewed with patient and pharmacy team. Barriers identified: none   Length of Stay: [place], NP  12/19/2017, 7:31 AM  Advanced Heart Failure Team Pager [phone] (M-F; 7a - 4p)  Please contact [facility] for night-coverage after hours (4p -7a ) and weekends on [email or web address]  Patient seen with NP, agree with the above note.  She diuresed well yesterday, weight down 3 lbs.  Says that breathing is ok.  Still having frequent PVCs on telemetry.  Had episode of nausea after mexiletine yesterday, has not recurred.   On exam, JVP 8 cm, PVCs noted on heart exam, CTAB, no edema.   Volume status improved, will give IV dose of Lasix this morning and transition to Lasix 60 mg daily tomorrow morning.  Continue current bisoprolol and losartan, BP too soft to titrate meds further.   With difficult-to-control afib, she is now s/p AV nodal ablation but is not effectively BiV pacing with very frequent PVCs.  She feels worse with PVCs.  Need to suppress PVCs to decrease risk of PVCs contributing to cardiomyopathy and also to promote BiV pacing.  - Mexiletine started yesterday, transition to 200 mg  tid to potentially decrease nausea (not sure mexiletine was the cause of nausea though).  She still has a lot of PVCs despite mexiletine.  Sotalol, dofetilide, amiodarone are not options.  - Will ask EP to see again, would we consider PVC ablation here?   She will continue Xarelto for chronic afib.   [name] 12/19/2017 10:41 AM

## 2017-12-19 NOTE — Progress Notes (Addendum)
Progress Note  Patient Name: Cheryl Vargas Date of Encounter: 12/19/2017  Primary Cardiologist: [name], MD   Subjective   Mildly nauseous this AM, she thinks 2/2 constipation, no CP, palpitations or SOB  Inpatient Medications    Scheduled Meds: . atorvastatin  40 mg Oral q1800  . bisoprolol  5 mg Oral BID  . furosemide  60 mg Intravenous Once  . [START ON 12/20/2017] furosemide  60 mg Oral Daily  . losartan  12.5 mg Oral QHS  . mexiletine  200 mg Oral Q8H  . potassium chloride SA  20 mEq Oral Daily  . potassium chloride  40 mEq Oral Once  . Rivaroxaban  15 mg Oral Q supper  . sodium chloride flush  3 mL Intravenous Q12H  . zolpidem  5 mg Oral QHS   Continuous Infusions: . sodium chloride     PRN Meds: sodium chloride, acetaminophen, calcium carbonate, docusate sodium, fluticasone, hydrALAZINE, hydrOXYzine, naphazoline-pheniramine, ondansetron (ZOFRAN) IV, polyethylene glycol, senna, sodium chloride flush   Vital Signs    Vitals:   12/18/17 2026 12/19/17 0553 12/19/17 0556 12/19/17 0931  BP: 102/61 119/74  (!) 94/59  Pulse: 82 78  80  Resp:      Temp: (!) 97.5 F (36.4 C) 97.8 F (36.6 C)    TempSrc: Oral Oral    SpO2: 95% 98%  99%  Weight:   139 lb 14.4 oz (63.5 kg)   Height:        Intake/Output Summary (Last 24 hours) at 12/19/2017 1232 Last data filed at 12/19/2017 0929 Gross per 24 hour  Intake 1023 ml  Output 2200 ml  Net -1177 ml   Filed Weights   12/17/17 1525 12/18/17 0656 12/19/17 0556  Weight: 142 lb 14.4 oz (64.8 kg) 142 lb 4.8 oz (64.5 kg) 139 lb 14.4 oz (63.5 kg)    Telemetry    V pacing w/V trigeminy - Personally Reviewed  ECG    No new EKGs - Personally Reviewed  Physical Exam   GEN: No acute distress.  chronically ill appearing Neck: No JVD Cardiac: RRR, extrasystoles, no murmurs, rubs, or gallops.  Respiratory: CTA b/l GI: Soft, nontender, non-distended  MS: No edema; No deformity, age appropriate atrophy Neuro:   Nonfocal  Psych: Normal affect   Labs    Chemistry Recent Labs  Lab 12/16/17 2111 12/17/17 0345 12/18/17 0733 12/19/17 0505  NA  --  138 138 138  K  --  3.2* 4.6 3.6  CL  --  100* 106 100*  CO2  --  24 24 26   GLUCOSE  --  180* 103* 88  BUN  --  23* 28* 30*  CREATININE  --  1.10* 1.08* 1.17*  CALCIUM  --  8.8* 8.8* 8.7*  PROT 7.1  --   --   --   ALBUMIN 3.5  --   --   --   AST 48*  --   --   --   ALT 38  --   --   --   ALKPHOS 69  --   --   --   BILITOT 1.0  --   --   --   GFRNONAA  --  46* 47* 43*  GFRAA  --  53* 55* 50*  ANIONGAP  --  14 8 12      Hematology Recent Labs  Lab 12/16/17 2034 12/18/17 0733  WBC 7.8 6.0  RBC 4.93 4.64  HGB 15.3* 14.3  HCT 44.1 42.4  MCV 89.5 91.4  MCH 31.0 30.8  MCHC 34.7 33.7  RDW 13.7 13.9  PLT 173 159    Cardiac Enzymes Recent Labs  Lab 12/16/17 2337 12/17/17 0345 12/17/17 1155  TROPONINI 0.05* 0.05* 0.04*    Recent Labs  Lab 12/16/17 2040  TROPIPOC 0.02     BNP Recent Labs  Lab 12/16/17 2111  BNP 971.1*     DDimer No results for input(s): DDIMER in the last 168 hours.   Radiology    No results found.  Cardiac Studies   Echo 12/17/17: EF 25-30%, severe diffuse HK, mild AI, mild MR, mod systolic dysfunction RV, LA dilation, RA dilation, moderate TR  Patient Profile     83 y.o. female with a history of permanent AF with RVR, she underwent CRTD implant and AVN ablation in December of 2018, NICM, prior VT arrest, and chronic systolic heart failure  She has previously been intolerant of amiodarone 2/2 nausea. She has also been on Mexiletine during hospitalization 08/2017 with reported nausea   Assessment & Plan    1.  Permanent atrial fibrillation S/p AVN ablation She did not previously tolerate lowering rate to 70 CHA2DS2Vasc is at least 4, on Xarelto  2.  Complete heart block Escape rate at 40bpm when device check was done monday Normal device function  3.  VT/PVC's No treated episodes Amber  was not able to overdrive suppress PVC's at 90 bpm, though she noted Interestingly, when rate turned down to assess underlying rhythm at 40, PVC's resolved.   Her effective CRT pacing percentage has dropped by about 10% in the last few days. Mexiletine 200mg  bid and VSR turned on today with some fusion noted Keep K>3.9, Mg >1.8 K+ being replaced  Her mexiletine was increased yesterday to 300mg  BID given persistent trigeminal PVCs, she had some nausea yesterday and was changed to 200mg  Q8 today  Dr. Curt Bears has seen and examined patient, she remains almost always with V trigeminy, Lynette Noah add Ranolazine 500mg  BID today.  Dr. Lovena Le did not think she was a particularly good ablation candidate. Dr. Rayann Heman Allycia Pitz see her tomorrow.  Hopefully the smaller more frequent dose of Mexiletine Maryam Feely be tolerable and the addition of Ranalizine Dera Vanaken help to suppress her ectopy  Historically with amiodarone she developed nausea and sotalol her QT interval prolonged BP has been a limiting factor for titration of he HF meds   4.  NICM/MR      C/w AHF team  5.  Chronic systolic heart failure Improved after diuresis   Per AHF team     For questions or updates, please contact [facility] Please consult [email or web address] for contact info under Cardiology/STEMI.      Signed, Baldwin [place], PA-C  12/19/2017, 12:32 PM    I have seen and examined this patient with Tanna Savoy.  Agree with above, note added to reflect my findings.  On exam, RRR, no murmrs, lungs clear.  Patient continuing to have PVCs.  She is on mexiletine 200 mg 3 times a day.  Due to the continued PVCs and low by V pacing, Sparkle Aube plan for Ranexa.  She is unfortunately not tolerated other antiarrhythmic medications.  Cheetara Hoge M. Meliza Kage MD 12/19/2017 6:29 PM

## 2017-12-19 NOTE — Progress Notes (Signed)
PROGRESS NOTE    Cheryl Vargas  EAV:[id number] DOB: [birth year] DOA: 12/16/2017 PCP: Lavone Orn, MD   Outpatient Specialists: [facility]    Brief Narrative:  Cheryl Vargas is a 83 y.o. female with medical history significant of sCHF with EF 25%, V. Tach, a fib on Xarelto, mitral regurgitation, cardiac arrest due to completed heart block, ICD placement, CKD-3, hyperlipidemia, who presents with shortness breath.  She feels nauseated when she had shortness of breath.  Was found to be fluid overloaded with stable EF.  Appears lots of PVCs which interferes with her BiV pacing is culprit.     Assessment & Plan:   Principal Problem:   SOB (shortness of breath) Active Problems:   Frequent PVCs   Hypertension   Acute on chronic systolic heart failure (HCC)   A-fib (HCC)   Systolic and diastolic CHF, acute (HCC)   Severe mitral valve regurgitation   CKD (chronic kidney disease), stage III (HCC)   CHF exacerbation (HCC)   SOB due to acute systolic CHF -cardiology gave IV Lasix with good diuresis -plan to change back to PO meds -2d echo: 25-30%-- stable -low sat diet -cardiology/EP consult appreciated: Need to limit PVCs as much as possible due to concern for worsening cardiomyopathy, and also the PVCs decrease BiV pacing-- mexiletine started and has been titrated up as not a good response yet-- is being seen again by EP--ranalazine added and Dr. Rayann Heman to see in the AM  PVCs -see above -keep K>4 and Mg >2  Hypokalemia -repleted -Mg ok -plan to watch closely  Atrial Fibrillation:  -continue Xarelto and zebeta  CKD (chronic kidney disease), stage III ([place]): -follow closely with diuresis    DVT prophylaxis:  Fully anticoagulated   Code Status: Full Code   Family Communication:   Disposition Plan:  Pending improvement   Consultants:  cards  Subjective: Having nausea this AM  Objective: Vitals:   12/18/17 2026 12/19/17 0553 12/19/17 0556  12/19/17 0931  BP: 102/61 119/74  (!) 94/59  Pulse: 82 78  80  Resp:      Temp: (!) 97.5 F (36.4 C) 97.8 F (36.6 C)    TempSrc: Oral Oral    SpO2: 95% 98%  99%  Weight:   63.5 kg (139 lb 14.4 oz)   Height:        Intake/Output Summary (Last 24 hours) at 12/19/2017 1332 Last data filed at 12/19/2017 0929 Gross per 24 hour  Intake 963 ml  Output 2000 ml  Net -1037 ml   Filed Weights   12/17/17 1525 12/18/17 0656 12/19/17 0556  Weight: 64.8 kg (142 lb 14.4 oz) 64.5 kg (142 lb 4.8 oz) 63.5 kg (139 lb 14.4 oz)    Examination:  General exam: in bed, appears ill Respiratory system: no increased work of breathing Cardiovascular system: rrr Gastrointestinal system: +Bs, soft Central nervous system: alert     Data Reviewed: I have personally reviewed following labs and imaging studies  CBC: Recent Labs  Lab 12/16/17 2034 12/18/17 0733  WBC 7.8 6.0  HGB 15.3* 14.3  HCT 44.1 42.4  MCV 89.5 91.4  PLT 173 782   Basic Metabolic Panel: Recent Labs  Lab 12/16/17 2034 12/17/17 0345 12/17/17 1155 12/18/17 0733 12/19/17 0505  NA 137 138  --  138 138  K 3.8 3.2*  --  4.6 3.6  CL 103 100*  --  106 100*  CO2 20* 24  --  24 26  GLUCOSE 138* 180*  --  103* 88  BUN 27* 23*  --  28* 30*  CREATININE 1.13* 1.10*  --  1.08* 1.17*  CALCIUM 9.3 8.8*  --  8.8* 8.7*  MG  --   --  2.0  --   --    GFR: Estimated Creatinine Clearance: 34.5 mL/min (A) (by C-G formula based on SCr of 1.17 mg/dL (H)). Liver Function Tests: Recent Labs  Lab 12/16/17 2111  AST 48*  ALT 38  ALKPHOS 69  BILITOT 1.0  PROT 7.1  ALBUMIN 3.5   No results for input(s): LIPASE, AMYLASE in the last 168 hours. No results for input(s): AMMONIA in the last 168 hours. Coagulation Profile: No results for input(s): INR, PROTIME in the last 168 hours. Cardiac Enzymes: Recent Labs  Lab 12/16/17 2337 12/17/17 0345 12/17/17 1155  TROPONINI 0.05* 0.05* 0.04*   BNP (last 3 results) No results for  input(s): PROBNP in the last 8760 hours. HbA1C: Recent Labs    12/17/17 0345  HGBA1C 5.6   CBG: No results for input(s): GLUCAP in the last 168 hours. Lipid Profile: Recent Labs    12/17/17 0345  CHOL 176  HDL 46  LDLCALC 120*  TRIG 52  CHOLHDL 3.8   Thyroid Function Tests: No results for input(s): TSH, T4TOTAL, FREET4, T3FREE, THYROIDAB in the last 72 hours. Anemia Panel: No results for input(s): VITAMINB12, FOLATE, FERRITIN, TIBC, IRON, RETICCTPCT in the last 72 hours. Urine analysis:    Component Value Date/Time   COLORURINE AMBER (A) 09/17/2017 1938   APPEARANCEUR HAZY (A) 09/17/2017 1938   LABSPEC 1.026 09/17/2017 1938   PHURINE 5.0 09/17/2017 1938   GLUCOSEU NEGATIVE 09/17/2017 1938   HGBUR MODERATE (A) 09/17/2017 1938   BILIRUBINUR NEGATIVE 09/17/2017 1938   KETONESUR NEGATIVE 09/17/2017 1938   PROTEINUR 30 (A) 09/17/2017 1938   UROBILINOGEN 0.2 03/17/2013 1423   NITRITE NEGATIVE 09/17/2017 1938   LEUKOCYTESUR SMALL (A) 09/17/2017 1938     )No results found for this or any previous visit (from the past 240 hour(s)).    Anti-infectives (From admission, onward)   None       Radiology Studies: No results found.      Scheduled Meds: . atorvastatin  40 mg Oral q1800  . bisoprolol  5 mg Oral BID  . furosemide  60 mg Intravenous Once  . [START ON 12/20/2017] furosemide  60 mg Oral Daily  . losartan  12.5 mg Oral QHS  . mexiletine  200 mg Oral Q8H  . potassium chloride SA  20 mEq Oral Daily  . ranolazine  500 mg Oral BID  . Rivaroxaban  15 mg Oral Q supper  . sodium chloride flush  3 mL Intravenous Q12H  . zolpidem  5 mg Oral QHS   Continuous Infusions: . sodium chloride       LOS: 1 day    Time spent: 25 min    Geradine Girt, DO [facility] Pager [phone]  If 7PM-7AM, please contact night-coverage [email or web address] Password [email or web address] 12/19/2017, 1:32 PM

## 2017-12-19 NOTE — Consult Note (Signed)
   [facility] CM Inpatient Consult   12/19/2017  Cheryl Vargas [birth year] [id number]  Patient was reviewed for [facility] [employer] services in the [facility] Advantage plan.   Met with the patient regarding the benefits of [place] [facility] services. Explained that [facility] is a covered benefit of insurance. Review information for [facility] Care Management and a brochure was provided with contact information and given..  Patient states, "I have this information at home, I think they mailed it to me."  Patient denies issues with getting to appointments, denies medication issues, or any resource needs at this time.  She states, "I think the benefits are fantastic but I don't have any needs, I feel right now."  Explained that [facility] does not interfere with or replace any services arranged by the inpatient care management staff. Patient verbalized understanding.  Patient declined services with [facility].  For questions, please contact:  Natividad Brood, RN BSN [facility] Liaison  [phone] business mobile phone Toll free office [phone]

## 2017-12-19 NOTE — Progress Notes (Signed)
Patient stable on overnight Shift. Patient ambulated in room and to bathroom with standby assistance. No complaints of pain. Patient VSS on this shift. Patient on tele pacing HR 83. Patient in bed resting. Will continue to monitor.

## 2017-12-20 DIAGNOSIS — R110 Nausea: Secondary | ICD-10-CM

## 2017-12-20 LAB — CBC
HCT: 44.2 % (ref 36.0–46.0)
Hemoglobin: 15.2 g/dL — ABNORMAL HIGH (ref 12.0–15.0)
MCH: 31.3 pg (ref 26.0–34.0)
MCHC: 34.4 g/dL (ref 30.0–36.0)
MCV: 90.9 fL (ref 78.0–100.0)
Platelets: 170 10*3/uL (ref 150–400)
RBC: 4.86 MIL/uL (ref 3.87–5.11)
RDW: 14.1 % (ref 11.5–15.5)
WBC: 7.9 10*3/uL (ref 4.0–10.5)

## 2017-12-20 LAB — BASIC METABOLIC PANEL
Anion gap: 14 (ref 5–15)
BUN: 34 mg/dL — ABNORMAL HIGH (ref 6–20)
CO2: 22 mmol/L (ref 22–32)
Calcium: 8.9 mg/dL (ref 8.9–10.3)
Chloride: 102 mmol/L (ref 101–111)
Creatinine, Ser: 1.34 mg/dL — ABNORMAL HIGH (ref 0.44–1.00)
GFR calc Af Amer: 42 mL/min — ABNORMAL LOW (ref >60)
GFR calc non Af Amer: 36 mL/min — ABNORMAL LOW (ref >60)
Glucose, Bld: 104 mg/dL — ABNORMAL HIGH (ref 65–99)
Potassium: 4.8 mmol/L (ref 3.5–5.1)
Sodium: 138 mmol/L (ref 135–145)

## 2017-12-20 LAB — MAGNESIUM: Magnesium: 2 mg/dL (ref 1.7–2.4)

## 2017-12-20 MED ORDER — FUROSEMIDE 40 MG PO TABS: 60.0000 mg | ORAL_TABLET | Freq: Every day | ORAL | Status: DC

## 2017-12-20 MED ORDER — POTASSIUM CHLORIDE CRYS ER 20 MEQ PO TBCR
20.0000 meq | EXTENDED_RELEASE_TABLET | Freq: Every day | ORAL | Status: DC
  Administered 2017-12-21: 20 meq via ORAL
  Filled 2017-12-20: qty 1

## 2017-12-20 NOTE — Progress Notes (Signed)
Pt without PVCs right now, sleeping. Pt declined walking. Sts she got nausea meds and wants to sleep now. Encouraged walking later tonight, esp before she is to go home. Yves Dill CES, ACSM 2:31 PM 12/20/2017

## 2017-12-20 NOTE — Progress Notes (Signed)
Progress Note   Subjective   Doing well today.  + nausea.   No new concerns  Inpatient Medications    Scheduled Meds: . atorvastatin  40 mg Oral q1800  . bisoprolol  5 mg Oral BID  . furosemide  60 mg Intravenous Once  . losartan  12.5 mg Oral QHS  . [START ON 12/21/2017] potassium chloride SA  20 mEq Oral Daily  . ranolazine  500 mg Oral BID  . Rivaroxaban  15 mg Oral Q supper  . sodium chloride flush  3 mL Intravenous Q12H  . zolpidem  5 mg Oral QHS   Continuous Infusions: . sodium chloride     PRN Meds: sodium chloride, acetaminophen, calcium carbonate, docusate sodium, fluticasone, hydrALAZINE, hydrOXYzine, naphazoline-pheniramine, ondansetron (ZOFRAN) IV, polyethylene glycol, senna, sodium chloride flush, sodium phosphate   Vital Signs    Vitals:   12/19/17 0556 12/19/17 0931 12/19/17 2029 12/20/17 0554  BP:  (!) 94/59 127/74 (!) 98/58  Pulse:  80 80 65  Resp:   18 18  Temp:   98 F (36.7 C) 98.1 F (36.7 C)  TempSrc:   Oral Oral  SpO2:  99% 98% 98%  Weight: 139 lb 14.4 oz (63.5 kg)   138 lb 6.4 oz (62.8 kg)  Height:        Intake/Output Summary (Last 24 hours) at 12/20/2017 0821 Last data filed at 12/20/2017 0600 Gross per 24 hour  Intake 723 ml  Output 1050 ml  Net -327 ml   Filed Weights   12/18/17 0656 12/19/17 0556 12/20/17 0554  Weight: 142 lb 4.8 oz (64.5 kg) 139 lb 14.4 oz (63.5 kg) 138 lb 6.4 oz (62.8 kg)    Telemetry    Afib, BiV paced - Personally Reviewed  Physical Exam   GEN- The patient is chronically ill appearing, alert and oriented x 3 today.   Head- normocephalic, atraumatic Eyes-  Sclera clear, conjunctiva pink Ears- hearing intact Oropharynx- clear Neck- supple, Lungs- Clear to ausculation bilaterally, normal work of breathing Heart- Regular rate and rhythm (paced) GI- soft, NT, ND, + BS Extremities- no clubbing, cyanosis, or edema  MS- diffuse atrophy Skin- no rash or lesion Psych- euthymic mood, full affect Neuro-  strength and sensation are intact   Labs    Chemistry Recent Labs  Lab 12/16/17 2111  12/18/17 0733 12/19/17 0505 12/20/17 0525  NA  --    < > 138 138 138  K  --    < > 4.6 3.6 4.8  CL  --    < > 106 100* 102  CO2  --    < > 24 26 22   GLUCOSE  --    < > 103* 88 104*  BUN  --    < > 28* 30* 34*  CREATININE  --    < > 1.08* 1.17* 1.34*  CALCIUM  --    < > 8.8* 8.7* 8.9  PROT 7.1  --   --   --   --   ALBUMIN 3.5  --   --   --   --   AST 48*  --   --   --   --   ALT 38  --   --   --   --   ALKPHOS 69  --   --   --   --   BILITOT 1.0  --   --   --   --   GFRNONAA  --    < >  47* 43* 36*  GFRAA  --    < > 55* 50* 42*  ANIONGAP  --    < > 8 12 14    < > = values in this interval not displayed.     Hematology Recent Labs  Lab 12/16/17 2034 12/18/17 0733 12/20/17 0525  WBC 7.8 6.0 7.9  RBC 4.93 4.64 4.86  HGB 15.3* 14.3 15.2*  HCT 44.1 42.4 44.2  MCV 89.5 91.4 90.9  MCH 31.0 30.8 31.3  MCHC 34.7 33.7 34.4  RDW 13.7 13.9 14.1  PLT 173 159 170    Cardiac Enzymes Recent Labs  Lab 12/16/17 2337 12/17/17 0345 12/17/17 1155  TROPONINI 0.05* 0.05* 0.04*    Recent Labs  Lab 12/16/17 2040  TROPIPOC 0.02        Assessment & Plan    1.  PVCs Multiple morphologies.  Not a dominant PVC amenable to ablation.  Did not have PVCs prior to device implant AV nodal ablation.  Possibly worse now in the setting of CHF with ventricular dilation.   She has done remarkably better overnight with a single dose of ranolazine. I am optimistic that this may help. Stop mexiletine and try to resume ranolazine.  Hopefully nausea will improve with treatment of constipation and stopping mexiletine. Could consider a lower dose of ranolazineif needed.   [name] MD, [facility] 12/20/2017 8:21 AM

## 2017-12-20 NOTE — Progress Notes (Addendum)
Advanced Heart Failure Rounding Note  PCP-Cardiologist: [name], MD   Subjective:    Modest UOP (-330) with 60 mg IV lasix x1, weight down another 1 lb (4 lbs total).  Echo 12/17/17: EF 25-30%, severe diffuse HK, mild AI, mild MR, mod systolic dysfunction RV, LA dilation, RA dilation, moderate TR  Mexilitine changed to 200 mg TID to decrease side effects. Ranexa 500 mg BID added by EP. Now Vpaced on tele.  Nauseated yesterday afternoon and throughout the night. Very little PO intake. Some improvement with BM. KUB negative. No CP, SOB, or dizziness.     Objective:   Weight Range: 138 lb 6.4 oz (62.8 kg) Body mass index is 23.03 kg/m.   Vital Signs:   Temp:  [98 F (36.7 C)-98.1 F (36.7 C)] 98.1 F (36.7 C) (04/11 0554) Pulse Rate:  [65-80] 65 (04/11 0554) Resp:  [18] 18 (04/11 0554) BP: (94-127)/(58-74) 98/58 (04/11 0554) SpO2:  [98 %-99 %] 98 % (04/11 0554) Weight:  [138 lb 6.4 oz (62.8 kg)] 138 lb 6.4 oz (62.8 kg) (04/11 0554) Last BM Date: 12/16/17  Weight change: Filed Weights   12/18/17 0656 12/19/17 0556 12/20/17 0554  Weight: 142 lb 4.8 oz (64.5 kg) 139 lb 14.4 oz (63.5 kg) 138 lb 6.4 oz (62.8 kg)    Intake/Output:   Intake/Output Summary (Last 24 hours) at 12/20/2017 0729 Last data filed at 12/20/2017 0600 Gross per 24 hour  Intake 723 ml  Output 1050 ml  Net -327 ml      Physical Exam    General: Elderly. No resp difficulty. HEENT: Normal Neck: Supple. JVP ~6-8. Carotids 2+ bilat; no bruits. No thyromegaly or nodule noted. Cor: PMI nondisplaced. RRR, No M/G/R noted Lungs: CTAB, normal effort. Abdomen: Soft, non-tender, non-distended, no HSM. No bruits or masses. +BS  Extremities: No cyanosis, clubbing, or rash. R and LLE no edema.  Neuro: Alert & orientedx3, cranial nerves grossly intact. moves all 4 extremities w/o difficulty. Affect pleasant   Telemetry   Vpaced 80 since ~16:00 yesterday. Personally reviewed.   EKG    No new  tracings.   Labs    CBC Recent Labs    12/18/17 0733 12/20/17 0525  WBC 6.0 7.9  HGB 14.3 15.2*  HCT 42.4 44.2  MCV 91.4 90.9  PLT 159 299   Basic Metabolic Panel Recent Labs    12/17/17 1155  12/19/17 0505 12/20/17 0525  NA  --    < > 138 138  K  --    < > 3.6 4.8  CL  --    < > 100* 102  CO2  --    < > 26 22  GLUCOSE  --    < > 88 104*  BUN  --    < > 30* 34*  CREATININE  --    < > 1.17* 1.34*  CALCIUM  --    < > 8.7* 8.9  MG 2.0  --   --  2.0   < > = values in this interval not displayed.   Liver Function Tests No results for input(s): AST, ALT, ALKPHOS, BILITOT, PROT, ALBUMIN in the last 72 hours. No results for input(s): LIPASE, AMYLASE in the last 72 hours. Cardiac Enzymes Recent Labs    12/17/17 1155  TROPONINI 0.04*    BNP: BNP (last 3 results) Recent Labs    09/17/17 2038 12/16/17 2111  BNP 1,492.6* 971.1*    ProBNP (last 3 results) No results for  input(s): PROBNP in the last 8760 hours.   D-Dimer No results for input(s): DDIMER in the last 72 hours. Hemoglobin A1C No results for input(s): HGBA1C in the last 72 hours. Fasting Lipid Panel No results for input(s): CHOL, HDL, LDLCALC, TRIG, CHOLHDL, LDLDIRECT in the last 72 hours. Thyroid Function Tests No results for input(s): TSH, T4TOTAL, T3FREE, THYROIDAB in the last 72 hours.  Invalid input(s): FREET3  Other results:   Imaging    Dg Abd Portable 1v  Result Date: 12/19/2017 CLINICAL DATA:  Constipation and nausea for 2 days. No appetite for 3 days. EXAM: PORTABLE ABDOMEN - 1 VIEW COMPARISON:  CT, 09/17/2017. FINDINGS: There is no bowel dilation to suggest obstruction or generalized adynamic ileus. No increase in colonic stool burden. No convincing renal or ureteral stones. Vascular calcifications are noted along the abdominal aorta. Well-positioned left hip arthroplasty. No acute skeletal abnormality. IMPRESSION: 1. No acute findings. 2. No evidence of bowel obstruction.  No  constipation. Electronically Signed   By: Lajean Manes M.D.   On: 12/19/2017 19:34     Medications:     Scheduled Medications: . atorvastatin  40 mg Oral q1800  . bisoprolol  5 mg Oral BID  . furosemide  60 mg Intravenous Once  . furosemide  60 mg Oral Daily  . losartan  12.5 mg Oral QHS  . mexiletine  200 mg Oral Q8H  . potassium chloride SA  20 mEq Oral Daily  . ranolazine  500 mg Oral BID  . Rivaroxaban  15 mg Oral Q supper  . sodium chloride flush  3 mL Intravenous Q12H  . zolpidem  5 mg Oral QHS    Infusions: . 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 Cardiomyopathy dates back to at least 2014 based on echoes (EF 30-35% in 2014).   Admitted for evaluation and treatment of SOB  Assessment/Plan   1. SOB - Likely multifactorial in the setting of Afib RVR and A/C systolic HF  - Troponin flat 0.05 > 0.05 > 0.04. LHC with no significant coronary disease in 04/2016. Low suspicion for ACS - Resolved.   2. A/C systolic HF: NICM, Unknown etiology LHC 04/2016 showed no significant coronary disease. Cardiac MRI 10/17 EF 36%, diffuse HK, normal RV, biatrial enlargement, moderate MR, non-coronary LGE pattern involving the mid-wall of the basal to mid septum and inferior wall. Possibly prior myocarditis versus a form of infiltrative disease. Echo 08/2017 EF 25-30%, diffuse HK, septal dyssynergy, moderate MR, severe LA dilation, moderate RA dilation, moderate  TR - Echo 12/17/17: EF 25-30%, severe diffuse HK, mild AI, mild MR, mod systolic dysfunction RV, LA dilation, RA dilation, moderate TR - Volume looks dry. She has had little PO intake and creatinine up slightly today. Hold lasix and K this am.  - Continue bisoprolol 5 mg BID - Continue losartan 12.5 mg qHS. SBP 90-100s. No room to titrate. - Plans to go to cardiac rehab outpatient once she can drive in June  3. Chronic Afib s/p AV node ablation and Medtronic CRT-D:  Now with frequent PVCs impairing BiV pacing.  - ICD interrogation 4/8 showed no VT, but significant drop in BiV pacing (78%) due to PVC burden over the last few days.  - Continue xarelto and bisoprolol - Need to limit PVCs as much as possible due to concern for worsening cardiomyopathy, and also the PVCs decrease BiV pacing.  - Probably not a good PVC ablation candidate per EP. Cannot tolerate amio, sotolol, or tikosyn - Mexilitine changed from 300 mg BID to 200 mg TID to help prevent nausea. EP added Ranexa 500 mg BID to help suppress PVCs. Now with worsened nausea.  - Dr Rayann Heman to see her today.  4. Moderate MR on echo 12/18 - Echo 12/17/17: mild MR directed centrally. No change  5. CKD, stage III, baseline ~1.0 - Monitor daily BMET - Creatinine 1.34 this am.   6. Hx of pulseless VT arrest 08/2017 (during DCCV, required defib + CPR 4-5 min) - Follows with Dr Rayann Heman - No VT on ICD interrogation.  - No driving until 09/1655. No change.   7. Abdominal Pain - KUB negative   Medication concerns reviewed with patient and pharmacy team. Barriers identified: none   Length of Stay: [place], NP  12/20/2017, 7:29 AM  Advanced Heart Failure Team Pager [phone] (M-F; 7a - 4p)  Please contact [facility] for night-coverage after hours (4p -7a ) and weekends on [email or web address]  Patient seen with NP, agree with the above note. On telemetry, PVCs are significantly decreased on ranolazine.  We have stopped mexiletine as  this may be the culprit for her nausea.    On exam, she is not volume overloaded with JVP 7-8 cm.  Creatinine up to 1.3, will hold Lasix today and start Lasix 40 qam/20 qpm tomorrow.    If she remains stable overnight, probably home tomorrow am.   [name] 12/20/2017 1:14 PM

## 2017-12-20 NOTE — Progress Notes (Signed)
PROGRESS NOTE    Cheryl Vargas  KGU:[id number] DOB: Nov 03, 1934 DOA: 12/16/2017 PCP: Lavone Orn, MD   Outpatient Specialists: Zada Finders    Brief Narrative:  Cheryl Vargas is a 83 y.o. female with medical history significant of sCHF with EF 25%, V. Tach, a fib on Xarelto, mitral regurgitation, cardiac arrest due to completed heart block, ICD placement, CKD-3, hyperlipidemia, who presents with shortness breath.  She feels nauseated when she had shortness of breath.  Was found to be fluid overloaded with stable EF.  Appears lots of PVCs which interferes with her BiV pacing is culprit.     Assessment & Plan:   Principal Problem:   SOB (shortness of breath) Active Problems:   Frequent PVCs   Hypertension   Acute on chronic systolic heart failure (HCC)   A-fib (HCC)   Systolic and diastolic CHF, acute (HCC)   Severe mitral valve regurgitation   CKD (chronic kidney disease), stage III (HCC)   CHF exacerbation (HCC)   SOB due to acute systolic CHF -cardiology gave IV Lasix with good diuresis -plan to change back to PO meds-- has not been ordered yet (does not look volume overloaded currently) -2d echo: 25-30%-- stable -low sat diet -cardiology/EP consult appreciated: Need to limit PVCs as much as possible due to concern for worsening cardiomyopathy, and also the PVCs decrease BiV pacing-- mexiletine started and has been titrated up as not a good response yet-- is being seen again by EP--ranalazine added and Dr. Rayann Heman to see in the AM-- patient's PVCs stopped with ranalazine but had some nausea-- plan to STOP mexiletine and continue ranalazine and monitor symptoms  PVCs -see above -keep K>4 and Mg >2  Hypokalemia -repleted -Mg ok -plan to watch closely  Atrial Fibrillation:  -continue Xarelto and zebeta  CKD (chronic kidney disease), stage III ([place]): -follow closely with diuresis  Constipation -good response to enema   DVT prophylaxis:  Fully  anticoagulated   Code Status: Full Code   Family Communication:   Disposition Plan:  Pending improvement   Consultants:  Cards/EP  Subjective: Still with some nausea  Objective: Vitals:   12/19/17 0931 12/19/17 2029 12/20/17 0554 12/20/17 0800  BP: (!) 94/59 127/74 (!) 98/58 (!) 93/52  Pulse: 80 80 65 80  Resp:  18 18 18   Temp:  98 F (36.7 C) 98.1 F (36.7 C) 98.3 F (36.8 C)  TempSrc:  Oral Oral Oral  SpO2: 99% 98% 98% 95%  Weight:   62.8 kg (138 lb 6.4 oz)   Height:        Intake/Output Summary (Last 24 hours) at 12/20/2017 1028 Last data filed at 12/20/2017 0900 Gross per 24 hour  Intake 600 ml  Output 500 ml  Net 100 ml   Filed Weights   12/18/17 0656 12/19/17 0556 12/20/17 0554  Weight: 64.5 kg (142 lb 4.8 oz) 63.5 kg (139 lb 14.4 oz) 62.8 kg (138 lb 6.4 oz)    Examination:  General exam: in bed, appears ill Respiratory system: no increased work  Of breathing Cardiovascular system: tele shows less PVCs Central nervous system: alert+ oriented, mood appropriate     Data Reviewed: I have personally reviewed following labs and imaging studies  CBC: Recent Labs  Lab 12/16/17 2034 12/18/17 0733 12/20/17 0525  WBC 7.8 6.0 7.9  HGB 15.3* 14.3 15.2*  HCT 44.1 42.4 44.2  MCV 89.5 91.4 90.9  PLT 173 159 628   Basic Metabolic Panel: Recent Labs  Lab 12/16/17 2034  12/17/17 0345 12/17/17 1155 12/18/17 0733 12/19/17 0505 12/20/17 0525  NA 137 138  --  138 138 138  K 3.8 3.2*  --  4.6 3.6 4.8  CL 103 100*  --  106 100* 102  CO2 20* 24  --  24 26 22   GLUCOSE 138* 180*  --  103* 88 104*  BUN 27* 23*  --  28* 30* 34*  CREATININE 1.13* 1.10*  --  1.08* 1.17* 1.34*  CALCIUM 9.3 8.8*  --  8.8* 8.7* 8.9  MG  --   --  2.0  --   --  2.0   GFR: Estimated Creatinine Clearance: 30.1 mL/min (A) (by C-G formula based on SCr of 1.34 mg/dL (H)). Liver Function Tests: Recent Labs  Lab 12/16/17 2111  AST 48*  ALT 38  ALKPHOS 69  BILITOT 1.0  PROT  7.1  ALBUMIN 3.5   No results for input(s): LIPASE, AMYLASE in the last 168 hours. No results for input(s): AMMONIA in the last 168 hours. Coagulation Profile: No results for input(s): INR, PROTIME in the last 168 hours. Cardiac Enzymes: Recent Labs  Lab 12/16/17 2337 12/17/17 0345 12/17/17 1155  TROPONINI 0.05* 0.05* 0.04*   BNP (last 3 results) No results for input(s): PROBNP in the last 8760 hours. HbA1C: No results for input(s): HGBA1C in the last 72 hours. CBG: No results for input(s): GLUCAP in the last 168 hours. Lipid Profile: No results for input(s): CHOL, HDL, LDLCALC, TRIG, CHOLHDL, LDLDIRECT in the last 72 hours. Thyroid Function Tests: No results for input(s): TSH, T4TOTAL, FREET4, T3FREE, THYROIDAB in the last 72 hours. Anemia Panel: No results for input(s): VITAMINB12, FOLATE, FERRITIN, TIBC, IRON, RETICCTPCT in the last 72 hours. Urine analysis:    Component Value Date/Time   COLORURINE AMBER (A) 09/17/2017 1938   APPEARANCEUR HAZY (A) 09/17/2017 1938   LABSPEC 1.026 09/17/2017 1938   PHURINE 5.0 09/17/2017 1938   GLUCOSEU NEGATIVE 09/17/2017 1938   HGBUR MODERATE (A) 09/17/2017 1938   BILIRUBINUR NEGATIVE 09/17/2017 1938   KETONESUR NEGATIVE 09/17/2017 1938   PROTEINUR 30 (A) 09/17/2017 1938   UROBILINOGEN 0.2 03/17/2013 1423   NITRITE NEGATIVE 09/17/2017 1938   LEUKOCYTESUR SMALL (A) 09/17/2017 1938     )No results found for this or any previous visit (from the past 240 hour(s)).    Anti-infectives (From admission, onward)   None       Radiology Studies: Dg Abd Portable 1v  Result Date: 12/19/2017 CLINICAL DATA:  Constipation and nausea for 2 days. No appetite for 3 days. EXAM: PORTABLE ABDOMEN - 1 VIEW COMPARISON:  CT, 09/17/2017. FINDINGS: There is no bowel dilation to suggest obstruction or generalized adynamic ileus. No increase in colonic stool burden. No convincing renal or ureteral stones. Vascular calcifications are noted along the  abdominal aorta. Well-positioned left hip arthroplasty. No acute skeletal abnormality. IMPRESSION: 1. No acute findings. 2. No evidence of bowel obstruction.  No constipation. Electronically Signed   By: Lajean Manes M.D.   On: 12/19/2017 19:34        Scheduled Meds: . atorvastatin  40 mg Oral q1800  . bisoprolol  5 mg Oral BID  . furosemide  60 mg Intravenous Once  . furosemide  60 mg Oral Daily  . losartan  12.5 mg Oral QHS  . [START ON 12/21/2017] potassium chloride SA  20 mEq Oral Daily  . ranolazine  500 mg Oral BID  . Rivaroxaban  15 mg Oral Q supper  . sodium chloride  flush  3 mL Intravenous Q12H  . zolpidem  5 mg Oral QHS   Continuous Infusions: . sodium chloride       LOS: 2 days    Time spent: 25 min    Geradine Girt, DO [facility] Pager [phone]  If 7PM-7AM, please contact night-coverage [email or web address] Password [facility] 12/20/2017, 10:28 AM

## 2017-12-21 LAB — BASIC METABOLIC PANEL
Anion gap: 9 (ref 5–15)
BUN: 38 mg/dL — ABNORMAL HIGH (ref 6–20)
CO2: 23 mmol/L (ref 22–32)
Calcium: 8.6 mg/dL — ABNORMAL LOW (ref 8.9–10.3)
Chloride: 103 mmol/L (ref 101–111)
Creatinine, Ser: 1.28 mg/dL — ABNORMAL HIGH (ref 0.44–1.00)
GFR calc Af Amer: 45 mL/min — ABNORMAL LOW (ref >60)
GFR calc non Af Amer: 38 mL/min — ABNORMAL LOW (ref >60)
Glucose, Bld: 93 mg/dL (ref 65–99)
Potassium: 4 mmol/L (ref 3.5–5.1)
Sodium: 135 mmol/L (ref 135–145)

## 2017-12-21 LAB — MAGNESIUM: Magnesium: 2 mg/dL (ref 1.7–2.4)

## 2017-12-21 MED ORDER — FUROSEMIDE 40 MG PO TABS
40.0000 mg | ORAL_TABLET | Freq: Every day | ORAL | Status: DC
  Administered 2017-12-21: 40 mg via ORAL
  Filled 2017-12-21: qty 1

## 2017-12-21 MED ORDER — RANOLAZINE ER 500 MG PO TB12: 500.0000 mg | ORAL_TABLET | Freq: Two times a day (BID) | ORAL | 0 refills | Status: DC

## 2017-12-21 MED ORDER — ATORVASTATIN CALCIUM 40 MG PO TABS: 40.0000 mg | ORAL_TABLET | Freq: Every day | ORAL | 0 refills | Status: DC

## 2017-12-21 MED ORDER — POLYETHYLENE GLYCOL 3350 17 G PO PACK: 17.0000 g | PACK | Freq: Every day | ORAL | 0 refills | Status: DC | PRN

## 2017-12-21 MED ORDER — FUROSEMIDE 20 MG PO TABS: 20.0000 mg | ORAL_TABLET | Freq: Every day | ORAL | Status: DC

## 2017-12-21 MED ORDER — FUROSEMIDE 20 MG PO TABS: ORAL_TABLET | ORAL | 0 refills | Status: DC

## 2017-12-21 NOTE — Progress Notes (Addendum)
Advanced Heart Failure Rounding Note  PCP-Cardiologist: [name], MD   Subjective:    Diuretics held yesterday with bump in creatinine. Mexilitine DCd due to nausea. Ranexa continued.   Mild nausea yesterday afternoon, but none this am. Feels good. Wants to go home. Denies CP, SOB, or dizziness.  Echo 12/17/17: EF 25-30%, severe diffuse HK, mild AI, mild MR, mod systolic dysfunction RV, LA dilation, RA dilation, moderate TR   Objective:   Weight Range: 138 lb (62.6 kg) Body mass index is 22.96 kg/m.   Vital Signs:   Temp:  [97.5 F (36.4 C)-98.3 F (36.8 C)] 97.5 F (36.4 C) (04/12 0452) Pulse Rate:  [80-90] 87 (04/12 0454) Resp:  [18-20] 20 (04/12 0452) BP: (93-109)/(52-73) 99/73 (04/12 0454) SpO2:  [93 %-97 %] 93 % (04/12 0454) Weight:  [138 lb (62.6 kg)] 138 lb (62.6 kg) (04/12 0454) Last BM Date: 12/19/17  Weight change: Filed Weights   12/19/17 0556 12/20/17 0554 12/21/17 0454  Weight: 139 lb 14.4 oz (63.5 kg) 138 lb 6.4 oz (62.8 kg) 138 lb (62.6 kg)    Intake/Output:   Intake/Output Summary (Last 24 hours) at 12/21/2017 0727 Last data filed at 12/20/2017 1500 Gross per 24 hour  Intake 240 ml  Output 100 ml  Net 140 ml      Physical Exam    General: Elderly. No resp difficulty. Sitting on side of bed HEENT: Normal Neck: Supple. JVP 5-6. Carotids 2+ bilat; no bruits. No thyromegaly or nodule noted. Cor: PMI nondisplaced. RRR, No M/G/R noted Lungs: CTAB, normal effort. Abdomen: Soft, non-tender, non-distended, no HSM. No bruits or masses. +BS  Extremities: No cyanosis, clubbing, or rash. R and LLE no edema.  Neuro: Alert & orientedx3, cranial nerves grossly intact. moves all 4 extremities w/o difficulty. Affect pleasant   Telemetry   Vpaced with frequent PVCs, some trigeminy this am. Personally reviewed.   EKG    No new tracings.   Labs    CBC Recent Labs    12/18/17 0733 12/20/17 0525  WBC 6.0 7.9  HGB 14.3 15.2*  HCT 42.4 44.2    MCV 91.4 90.9  PLT 159 419   Basic Metabolic Panel Recent Labs    12/19/17 0505 12/20/17 0525  NA 138 138  K 3.6 4.8  CL 100* 102  CO2 26 22  GLUCOSE 88 104*  BUN 30* 34*  CREATININE 1.17* 1.34*  CALCIUM 8.7* 8.9  MG  --  2.0   Liver Function Tests No results for input(s): AST, ALT, ALKPHOS, BILITOT, PROT, ALBUMIN in the last 72 hours. No results for input(s): LIPASE, AMYLASE in the last 72 hours. Cardiac Enzymes No results for input(s): CKTOTAL, CKMB, CKMBINDEX, TROPONINI in the last 72 hours.  BNP: BNP (last 3 results) Recent Labs    09/17/17 2038 12/16/17 2111  BNP 1,492.6* 971.1*    ProBNP (last 3 results) No results for input(s): PROBNP in the last 8760 hours.   D-Dimer No results for input(s): DDIMER in the last 72 hours. Hemoglobin A1C No results for input(s): HGBA1C in the last 72 hours. Fasting Lipid Panel No results for input(s): CHOL, HDL, LDLCALC, TRIG, CHOLHDL, LDLDIRECT in the last 72 hours. Thyroid Function Tests No results for input(s): TSH, T4TOTAL, T3FREE, THYROIDAB in the last 72 hours.  Invalid input(s): FREET3  Other results:   Imaging    No results found.   Medications:     Scheduled Medications: . atorvastatin  40 mg Oral q1800  . bisoprolol  5 mg Oral BID  . losartan  12.5 mg Oral QHS  . potassium chloride SA  20 mEq Oral Daily  . ranolazine  500 mg Oral BID  . Rivaroxaban  15 mg Oral Q supper  . sodium chloride flush  3 mL Intravenous Q12H  . zolpidem  5 mg Oral QHS    Infusions: . sodium chloride      PRN Medications: sodium chloride, acetaminophen, calcium carbonate, docusate sodium, fluticasone, hydrALAZINE, hydrOXYzine, naphazoline-pheniramine, ondansetron (ZOFRAN) IV, polyethylene glycol, senna, sodium chloride flush, sodium phosphate    Patient Profile   Cheryl Vargas is a 83 y.o. 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 Cardiomyopathy dates back to at least 2014 based on echoes (EF 30-35% in 2014).   Admitted for evaluation and treatment of SOB  Assessment/Plan   1. SOB - Likely multifactorial in the setting of diminished BiV pacing/frequent PVCs and A/C systolic HF  - Troponin flat 0.05 > 0.05 > 0.04. LHC with no significant coronary disease in 04/2016. Low suspicion for ACS - Resolved.   2. A/C systolic HF: NICM, Unknown etiology LHC 04/2016 showed no significant coronary disease. Cardiac MRI 10/17 EF 36%, diffuse HK, normal RV, biatrial enlargement, moderate MR, non-coronary LGE pattern involving the mid-wall of the basal to mid septum and inferior wall. Possibly prior myocarditis versus a form of infiltrative disease. Echo 08/2017 EF 25-30%, diffuse HK, septal dyssynergy, moderate MR, severe LA dilation, moderate RA dilation, moderate TR - Echo 12/17/17: EF 25-30%, severe diffuse HK, mild AI, mild MR, mod systolic dysfunction RV, LA dilation, RA dilation, moderate TR - Volume status stable. Resume lasix today 40 mg am, 20 mg pm today. (takes 40 mg daily at home).  - Continue bisoprolol 5 mg BID - Continue losartan 12.5 mg qHS. SBP 90s. No room to titrate. - Plans to go to cardiac rehab outpatient once she can drive in June  3. Chronic Afib s/p AV node ablation and Medtronic CRT-D: Now with frequent PVCs impairing BiV pacing.  - ICD interrogation 4/8 showed no VT, but significant drop in BiV pacing (78%) due to PVC burden over the last few days.  - Continue xarelto and bisoprolol - Need to limit PVCs as much as possible due to concern for worsening cardiomyopathy, and also the PVCs decrease BiV pacing.  - Not a good PVC ablation candidate per EP. Cannot tolerate amio, sotolol, or tikosyn - Mexilitine DC'd with nausea. Now on ranexa 500 mg BID. Getting EKG after dose this  am to assess QTc. Stable for DC per EP as long as QTc is okay.   4. Moderate MR on echo 12/18 - Echo 12/17/17: mild MR directed centrally. No change.   5. CKD, stage III, baseline ~1.0 - Monitor daily BMET - Pending this am.   6. Hx of pulseless VT arrest 08/2017 (during DCCV, required defib + CPR 4-5 min) - Follows with Dr Rayann Heman - No VT on ICD interrogation.  - No driving until 11/2669. No change.   7. Abdominal Pain - KUB negative. Resolved   Medication concerns reviewed with patient and pharmacy team. Barriers identified: none  HF follow up has been arranged.   Length of Stay: [place], NP  12/21/2017, 7:27 AM  Advanced Heart Failure Team Pager  [phone] (M-F; 7a - 4p)  Please contact [facility] for night-coverage after hours (4p -7a ) and weekends on [email or web address]  Patient seen with NP, agree with the above note.  She is doing well on ranolazine, PVCs decreased and tolerating.  Will restart Lasix 40 mg qam/20 mg qpm today.  OK for home, will arrange followup.   Cheryl Vargas 12/21/2017

## 2017-12-21 NOTE — Progress Notes (Signed)
CARDIAC REHAB PHASE I   PRE:  Rate/Rhythm: 80 pacing with big/trig PVCs    BP: sitting 103/69    SaO2:   MODE:  Ambulation: 370 ft   POST:  Rate/Rhythm: 94 pacing with PVCs (less)    BP: sitting 100/54     SaO2:   Pt feeling well. ? PVCs decreased with walking (hard to see on tiny monitor). She is unsteady at times due to her hip. We discussed her using her rollator, esp initially. No c/o walking. Encouraged walking at home with rollator and friend. [phone]   [place], ACSM 12/21/2017 10:19 AM

## 2017-12-21 NOTE — Progress Notes (Signed)
EKG is reviewed QT on paced complex measured by myself is 424ms, corrects to 482ms  Tommye Standard, PA-C

## 2017-12-21 NOTE — Progress Notes (Signed)
Patient asking about if restarting lasix. Cheryl Vargas with CHF team paged and said he will follow up.    EKG obtained after ranexa.  [name] paged with results.

## 2017-12-21 NOTE — Progress Notes (Signed)
Progress Note   Subjective   Doing well today, the patient denies CP or SOB.  No nausea today.  Wants to go home.  No new concerns  Inpatient Medications    Scheduled Meds: . atorvastatin  40 mg Oral q1800  . bisoprolol  5 mg Oral BID  . losartan  12.5 mg Oral QHS  . potassium chloride SA  20 mEq Oral Daily  . ranolazine  500 mg Oral BID  . Rivaroxaban  15 mg Oral Q supper  . sodium chloride flush  3 mL Intravenous Q12H  . zolpidem  5 mg Oral QHS   Continuous Infusions: . sodium chloride     PRN Meds: sodium chloride, acetaminophen, calcium carbonate, docusate sodium, fluticasone, hydrALAZINE, hydrOXYzine, naphazoline-pheniramine, ondansetron (ZOFRAN) IV, polyethylene glycol, senna, sodium chloride flush, sodium phosphate   Vital Signs    Vitals:   12/20/17 1100 12/20/17 2047 12/21/17 0452 12/21/17 0454  BP: 95/69 109/67 108/70 99/73  Pulse: 85 84 90 87  Resp:  20 20   Temp: (!) 97.5 F (36.4 C) 98.2 F (36.8 C) (!) 97.5 F (36.4 C)   TempSrc: Oral Oral Oral   SpO2: 97% 97% 94% 93%  Weight:    138 lb (62.6 kg)  Height:        Intake/Output Summary (Last 24 hours) at 12/21/2017 0732 Last data filed at 12/20/2017 1500 Gross per 24 hour  Intake 240 ml  Output 100 ml  Net 140 ml   Filed Weights   12/19/17 0556 12/20/17 0554 12/21/17 0454  Weight: 139 lb 14.4 oz (63.5 kg) 138 lb 6.4 oz (62.8 kg) 138 lb (62.6 kg)    Telemetry    Afib, V paced, occasional PVCs (improved) - Personally Reviewed  Physical Exam   GEN- The patient is elderly appearing, alert and oriented x 3 today.   Head- normocephalic, atraumatic Eyes-  Sclera clear, conjunctiva pink Ears- hearing intact Oropharynx- clear Neck- supple, Lungs- Clear to ausculation bilaterally, normal work of breathing Heart- Regular rate and rhythm (paced)  GI- soft, NT, ND, + BS Extremities- no clubbing, cyanosis, or edema  MS- diffuse atrophy Skin- no rash or lesion Psych- euthymic mood, full  affect Neuro- strength and sensation are intact   Labs    Chemistry Recent Labs  Lab 12/16/17 2111  12/18/17 0733 12/19/17 0505 12/20/17 0525  NA  --    < > 138 138 138  K  --    < > 4.6 3.6 4.8  CL  --    < > 106 100* 102  CO2  --    < > 24 26 22   GLUCOSE  --    < > 103* 88 104*  BUN  --    < > 28* 30* 34*  CREATININE  --    < > 1.08* 1.17* 1.34*  CALCIUM  --    < > 8.8* 8.7* 8.9  PROT 7.1  --   --   --   --   ALBUMIN 3.5  --   --   --   --   AST 48*  --   --   --   --   ALT 38  --   --   --   --   ALKPHOS 69  --   --   --   --   BILITOT 1.0  --   --   --   --   GFRNONAA  --    < > 47*  43* 36*  GFRAA  --    < > 55* 50* 42*  ANIONGAP  --    < > 8 12 14    < > = values in this interval not displayed.     Hematology Recent Labs  Lab 12/16/17 2034 12/18/17 0733 12/20/17 0525  WBC 7.8 6.0 7.9  RBC 4.93 4.64 4.86  HGB 15.3* 14.3 15.2*  HCT 44.1 42.4 44.2  MCV 89.5 91.4 90.9  MCH 31.0 30.8 31.3  MCHC 34.7 33.7 34.4  RDW 13.7 13.9 14.1  PLT 173 159 170    Cardiac Enzymes Recent Labs  Lab 12/16/17 2337 12/17/17 0345 12/17/17 1155  TROPONINI 0.05* 0.05* 0.04*    Recent Labs  Lab 12/16/17 2040  TROPIPOC 0.02        Assessment & Plan    1.  PVCs Improved but not completely resolved Tolerating ranexa well.   Repeat ekg after this am's dose (ordered).  If qt is stable, ok to discharge from my standpoint.  Follow-up with EP NP at next available. Follow-up with CHF team as scheduled  Electrophysiology team to see as needed while here. Please call with questions.  [name] MD, [facility] 12/21/2017 7:32 AM

## 2017-12-23 NOTE — Discharge Summary (Signed)
[facility] Discharge Summary   Patient: Cheryl Vargas GHW:[id number]   PCP: Lavone Orn, MD DOB: January 01, 1935   Date of admission: 12/16/2017   Date of discharge: 12/21/2017    Discharge Diagnoses:  Principal Problem:   SOB (shortness of breath) Active Problems:   Frequent PVCs   Hypertension   Acute on chronic systolic heart failure (HCC)   A-fib (HCC)   Systolic and diastolic CHF, acute (HCC)   Severe mitral valve regurgitation   CKD (chronic kidney disease), stage III ([place])   CHF exacerbation ([place])   Admitted From: home Disposition:  home  Recommendations for Outpatient Follow-up:  1. Follow-up with PCP, cardiology heart failure team as well as electrophysiology team.  Follow-up Information    Shirley Friar, PA-C Follow up on 12/28/2017.   Specialty:  Physician Assistant Why:  Heart Failure Follow up. 9:30 am: Park in ER lot, enter blue awning to left of ER. Parking also under [address] on [address] ([address] entrance, [place] [address], Media planner to 1st floor). Take all am meds and bring bottles to appt. Contact information: [place] [address] [phone]        Lavone Orn, MD. Schedule an appointment as soon as possible for a visit in 1 week.   Specialty:  Internal Medicine Contact information: [address], [address] [place] [address] [phone]        [name], MD. Go on 12/24/2017.   Specialty:  Cardiology Why:  @ 2 :15pm Contact information: [place] [address] [phone]          Diet recommendation: Cardiac diet  Activity: The patient is advised to gradually reintroduce usual activities.  Discharge Condition: good  Code Status: full code  History of present illness: As per the H and P dictated on admission, "Cheryl Vargas is a 83 y.o. female with medical history significant of sCHF with EF 25%, V. Tach, a fib on Xarelto, mitral  regurgitation, cardiac arrest due to completed heart block, ICD placement, CKD-3, hyperlipidemia, who presents with shortness breath.  Pt states that she has had several episodes of shortness of breath since last night. Initial episode happened when she was carrying a chair last night. It lasted for about 10-15 min and resolved after she had a rest. Tonight she had similar symptoms again. She he does not have chest pain, cough, fever or chills. She felt nauseated when she had shortness of breath. No vomiting, diarrhea or abdominal pain and no symptoms of UTI. Denies unilateral weakness. She denies any increasing peripheral edema, but she states that she carries her weight in her abdomen and she does feel like her abdominal girth is increasing."  Hospital Course:  Summary of her active problems in the hospital is as following. Acute on chronic systolic CHF Moderate MR Ischemic cardiomyopathy Left heart cath in 2017 shows no significant coronary disease. Cardiac MRI 2017 shows myocarditis versus infiltrative disease. Cardiology consulted as well as advanced heart failure team consulted. Continue bisoprolol 5 mg twice daily, losartan 12.5 mg nightly check rehab as an outpatient. Treated with IV Lasix, will discharge home on oral Lasix. cardiology/EP consult appreciated: Need to limit PVCs as much as possible due to concern for worsening cardiomyopathy, and also the PVCs decrease BiV pacing-- mexiletine started and has been titrated up as not a good response yet-- is being seen again by EP--ranalazine added and Dr. Rayann Heman to see in the AM-- patient's PVCs stopped  with ranalazine but had some nausea-- plan to STOP mexiletine and continue ranalazine and monitor symptoms  Chronic A. fib S/P AV node ablation. Medtronic CRT-D. Frequent PVCs making by V pacing worsening as well as worsening cardiomyopathy. EP consulted, patient was initially started on mexiletine 2 reduce the PVCs although no  benefit. Mexiletine discontinued and patient started on Ranexa. QTC on discharge 476 per EP.  Stable to be discharged from their perspective.  Hypokalemia -repleted -Mg ok -plan to watch closely  Atrial Fibrillation:  -continue Xarelto and zebeta  CKD (chronic kidney disease), stage III ([place]): -follow closely with diuresis  Constipation -good response to enema   All other chronic medical condition were stable during the hospitalization.  Patient was ambulatory without any assistance. On the day of the discharge the patient's vitals were stable, and no other acute medical condition were reported by patient. the patient was felt safe to be discharge at home with family.  Procedures and Results:  Echocardiogram   Study Conclusions  - Left ventricle: The cavity size was normal. Wall thickness was   normal. Systolic function was severely reduced. The estimated   ejection fraction was in the range of 25% to 30%. Severe diffuse   hypokinesis with no identifiable regional variations. No evidence   of thrombus. - Aortic valve: There was mild regurgitation. - Mitral valve: There was mild regurgitation directed centrally. - Left atrium: The atrium was mildly dilated. - Right ventricle: Systolic function was moderately reduced. - Right atrium: The atrium was mildly dilated. - Tricuspid valve: There was moderate regurgitation. - Pulmonary arteries: Systolic pressure was mildly increased. PA   peak pressure: 35 mm Hg (S).  Consultations:  Cardiology, advanced heart failure team as well as electrophysiology  DISCHARGE MEDICATION: Allergies as of 12/21/2017      Reactions   Sotalol Other (See Comments)   Prolonged QTc   Alendronate Sodium Other (See Comments)   Arm pain   Amiodarone Nausea Only   Ciprofloxacin Other (See Comments)   Not effective   Coreg [carvedilol] Other (See Comments)   Fatigue   Metoprolol Other (See Comments)   Profound fatigue      Medication  List    TAKE these medications   acetaminophen 500 MG tablet Commonly known as:  TYLENOL Take 500 mg by mouth every 6 (six) hours as needed for moderate pain.   atorvastatin 40 MG tablet Commonly known as:  LIPITOR Take 1 tablet (40 mg total) by mouth daily at 6 PM.   bisoprolol 10 MG tablet Commonly known as:  ZEBETA Take 5 mg by mouth 2 (two) times daily.   calcium carbonate 500 MG chewable tablet Commonly known as:  TUMS - dosed in mg elemental calcium Chew 1-2 tablets (200-400 mg of elemental calcium total) by mouth as needed for indigestion or heartburn.   docusate sodium 100 MG capsule Commonly known as:  COLACE Take 100 mg by mouth daily as needed for mild constipation.   fluticasone 50 MCG/ACT nasal spray Commonly known as:  FLONASE Place 1 spray into both nostrils daily as needed for allergies or rhinitis.   furosemide 20 MG tablet Commonly known as:  LASIX 40 MG IN MORNING AND 20 MG IN EVENING. TAKE AN EXTRA TABLET DAILY AS NEEDED FOR WEIGHT GAIN OF GREATER THAN 3 POUNDS What changed:  additional instructions   losartan 25 MG tablet Commonly known as:  COZAAR Take ONE-HALF TABLET BY MOUTH at bedtime   naphazoline-pheniramine 0.025-0.3 % ophthalmic solution Commonly known  as:  NAPHCON-A Place 1 drop into both eyes daily as needed (dry eyes).   polyethylene glycol packet Commonly known as:  MIRALAX / GLYCOLAX Take 17 g by mouth daily as needed for mild constipation.   potassium chloride SA 20 MEQ tablet Commonly known as:  K-DUR,KLOR-CON Take 1 tablet (20 mEq total) by mouth daily.   ranolazine 500 MG 12 hr tablet Commonly known as:  RANEXA Take 1 tablet (500 mg total) by mouth 2 (two) times daily.   Rivaroxaban 15 MG Tabs tablet Commonly known as:  XARELTO Take 1 tablet (15 mg total) by mouth daily with supper. RESUME ON 2 JAN   zolpidem 5 MG tablet Commonly known as:  AMBIEN Take 1 tablet (5 mg total) by mouth at bedtime.      Allergies   Allergen Reactions  . Sotalol Other (See Comments)    Prolonged QTc  . Alendronate Sodium Other (See Comments)    Arm pain  . Amiodarone Nausea Only  . Ciprofloxacin Other (See Comments)    Not effective  . Coreg [Carvedilol] Other (See Comments)    Fatigue   . Metoprolol Other (See Comments)    Profound fatigue   Discharge Instructions    Amb Referral to Cardiac Rehabilitation   Complete by:  As directed    Diagnosis:  Heart Failure (see criteria below if ordering Phase II)   Heart Failure Type:  Chronic Systolic & Diastolic   Diet - low sodium heart healthy   Complete by:  As directed    Discharge instructions   Complete by:  As directed    It is important that you read following instructions as well as go over your medication list with RN to help you understand your care after this hospitalization.  Discharge Instructions: Please follow-up with PCP in one week  Please request your primary care physician to go over all Hospital Tests and Procedure/Radiological results at the follow up,  Please get all Hospital records sent to your PCP by signing hospital release before you go home.   Do not take more than prescribed Pain, Sleep and Anxiety Medications. You were cared for by a hospitalist during your hospital stay. If you have any questions about your discharge medications or the care you received while you were in the hospital after you are discharged, you can call the unit and ask to speak with the hospitalist on call if the hospitalist that took care of you is not available.  Once you are discharged, your primary care physician will handle any further medical issues. Please note that NO REFILLS for any discharge medications will be authorized once you are discharged, as it is imperative that you return to your primary care physician (or establish a relationship with a primary care physician if you do not have one) for your aftercare needs so that they can reassess your need  for medications and monitor your lab values. You Must read complete instructions/literature along with all the possible adverse reactions/side effects for all the Medicines you take and that have been prescribed to you. Take any new Medicines after you have completely understood and accept all the possible adverse reactions/side effects. Wear Seat belts while driving. If you have smoked or chewed Tobacco in the last 2 yrs please stop smoking and/or stop any Recreational drug use.   Increase activity slowly   Complete by:  As directed      Discharge Exam: Filed Weights   12/19/17 0556 12/20/17 0554 12/21/17 0454  Weight: 63.5 kg (139 lb 14.4 oz) 62.8 kg (138 lb 6.4 oz) 62.6 kg (138 lb)   Vitals:   12/21/17 0454 12/21/17 1212  BP: 99/73 101/87  Pulse: 87 97  Resp:  20  Temp:  98 F (36.7 C)  SpO2: 93% 95%   General: Appear in no distress, no Rash; Oral Mucosa moist. Cardiovascular: S1 and S2 Present, no Murmur, no JVD Respiratory: Bilateral Air entry present and Clear to Auscultation, no Crackles, no wheezes Abdomen: Bowel Sound present, Soft and no tenderness Extremities: no Pedal edema, no calf tenderness Neurology: Grossly no focal neuro deficit.  The results of significant diagnostics from this hospitalization (including imaging, microbiology, ancillary and laboratory) are listed below for reference.    Significant Diagnostic Studies: Dg Chest 2 View  Result Date: 12/16/2017 CLINICAL DATA:  Shortness of breath. EXAM: CHEST - 2 VIEW COMPARISON:  09/17/2017 FINDINGS: The cardio pericardial silhouette is enlarged. The lungs are clear without focal pneumonia, edema, pneumothorax or pleural effusion. Left-sided permanent pacemaker again noted. Bones are diffusely demineralized. Telemetry leads overlie the chest. IMPRESSION: No active cardiopulmonary disease. Electronically Signed   By: Misty Stanley M.D.   On: 12/16/2017 21:16   Dg Abd Portable 1v  Result Date: 12/19/2017 CLINICAL  DATA:  Constipation and nausea for 2 days. No appetite for 3 days. EXAM: PORTABLE ABDOMEN - 1 VIEW COMPARISON:  CT, 09/17/2017. FINDINGS: There is no bowel dilation to suggest obstruction or generalized adynamic ileus. No increase in colonic stool burden. No convincing renal or ureteral stones. Vascular calcifications are noted along the abdominal aorta. Well-positioned left hip arthroplasty. No acute skeletal abnormality. IMPRESSION: 1. No acute findings. 2. No evidence of bowel obstruction.  No constipation. Electronically Signed   By: Lajean Manes M.D.   On: 12/19/2017 19:34    Microbiology: No results found for this or any previous visit (from the past 240 hour(s)).   Labs: CBC: Recent Labs  Lab 12/16/17 2034 12/18/17 0733 12/20/17 0525  WBC 7.8 6.0 7.9  HGB 15.3* 14.3 15.2*  HCT 44.1 42.4 44.2  MCV 89.5 91.4 90.9  PLT 173 159 470   Basic Metabolic Panel: Recent Labs  Lab 12/17/17 0345 12/17/17 1155 12/18/17 0733 12/19/17 0505 12/20/17 0525 12/21/17 0652  NA 138  --  138 138 138 135  K 3.2*  --  4.6 3.6 4.8 4.0  CL 100*  --  106 100* 102 103  CO2 24  --  24 26 22 23   GLUCOSE 180*  --  103* 88 104* 93  BUN 23*  --  28* 30* 34* 38*  CREATININE 1.10*  --  1.08* 1.17* 1.34* 1.28*  CALCIUM 8.8*  --  8.8* 8.7* 8.9 8.6*  MG  --  2.0  --   --  2.0 2.0   Liver Function Tests: Recent Labs  Lab 12/16/17 2111  AST 48*  ALT 38  ALKPHOS 69  BILITOT 1.0  PROT 7.1  ALBUMIN 3.5   No results for input(s): LIPASE, AMYLASE in the last 168 hours. No results for input(s): AMMONIA in the last 168 hours. Cardiac Enzymes: Recent Labs  Lab 12/16/17 2337 12/17/17 0345 12/17/17 1155  TROPONINI 0.05* 0.05* 0.04*   BNP (last 3 results) Recent Labs    09/17/17 2038 12/16/17 2111  BNP 1,492.6* 971.1*   CBG: No results for input(s): GLUCAP in the last 168 hours. Time spent: 35 minutes  Signed:  Berle Mull  [facility] 12/21/2017 , 6:20 PM

## 2017-12-24 ENCOUNTER — Telehealth (HOSPITAL_COMMUNITY): Payer: Self-pay

## 2017-12-24 ENCOUNTER — Telehealth: Payer: Self-pay | Admitting: Internal Medicine

## 2017-12-24 ENCOUNTER — Encounter: Payer: PPO | Admitting: Internal Medicine

## 2017-12-24 NOTE — Telephone Encounter (Signed)
I spoke with the patient. She wants Dr. Rayann Heman to know she feels really good, stronger.  Weight is down "a few ounces". BP 93/65, HR 91.  Dr. Rayann Heman saw patient before discharge on 12/21/17 with instruction to f/u with next available EP NP.  Spoke with nurse Sonia Baller who will review with Dr. Rayann Heman.

## 2017-12-24 NOTE — Telephone Encounter (Signed)
Received another referral in regards Cardiac Rehab due to patient recently being hospitalized. Called patient as she had a question about Outpatient Cardiac Rehab and exercising at her facility in her apt complex. I explained to patient what Cardiac Rehab is and she stated she is not ready right now. Patient will call when she is ready. If no call by June, will follow up. Patients paperwork is in file cabine

## 2017-12-24 NOTE — Telephone Encounter (Signed)
Patients insurance is active and benefits verified through [employer] - $15.00 co-pay, no deductible, out of pocket amount of $3,400/$1,100 has been met, no co-insurance, and no pre-authorization is required. Reference [id number]  Will contact patient to see if interested in the Cardiac Rehab Program. If interested, patient will need to complete follow up appt. Once completed, patient will be contacted for scheduling upon review by the RN Navigator.

## 2017-12-24 NOTE — Telephone Encounter (Signed)
Pt rescheduled for Dr. Rayann Heman in June.  NO further action at this time.

## 2017-12-24 NOTE — Telephone Encounter (Signed)
Spoke with Dr. Oliver Hum, Sonia Baller.  Per Dr. Rayann Heman, ok for patient to cancel appointment with him today, but be sure to keep appointment at [facility] this Friday 4/19.  Advised also that per Sonia Baller, she will receive a call to change her June appointment from APP to Dr. Rayann Heman.  Pt would like to know if she needs to keep June echo appointment, since she had one while in the hospital.  Advised once that is determined we will let her know. Pt appreciative for the assistance today.

## 2017-12-24 NOTE — Telephone Encounter (Signed)
New Message:     Pt is calling to see if her appt today at 2:15 is necessary.

## 2017-12-28 ENCOUNTER — Ambulatory Visit (HOSPITAL_COMMUNITY)
Admit: 2017-12-28 | Discharge: 2017-12-28 | Disposition: A | Discharge status: Home / Self Care | Payer: PPO | Source: Ambulatory Visit | Attending: Cardiology | Admitting: Cardiology

## 2017-12-28 ENCOUNTER — Encounter: Payer: Self-pay | Admitting: Cardiology

## 2017-12-28 ENCOUNTER — Encounter (HOSPITAL_COMMUNITY): Payer: Self-pay

## 2017-12-28 VITALS — BP 104/72 | HR 90 | Wt 141.8 lb

## 2017-12-28 DIAGNOSIS — Z95810 Presence of automatic (implantable) cardiac defibrillator: Secondary | ICD-10-CM | POA: Insufficient documentation

## 2017-12-28 DIAGNOSIS — R1013 Epigastric pain: Secondary | ICD-10-CM | POA: Diagnosis not present

## 2017-12-28 DIAGNOSIS — N183 Chronic kidney disease, stage 3 (moderate): Secondary | ICD-10-CM | POA: Diagnosis not present

## 2017-12-28 DIAGNOSIS — I130 Hypertensive heart and chronic kidney disease with heart failure and stage 1 through stage 4 chronic kidney disease, or unspecified chronic kidney disease: Secondary | ICD-10-CM | POA: Diagnosis not present

## 2017-12-28 DIAGNOSIS — I428 Other cardiomyopathies: Secondary | ICD-10-CM | POA: Diagnosis not present

## 2017-12-28 DIAGNOSIS — I5022 Chronic systolic (congestive) heart failure: Secondary | ICD-10-CM

## 2017-12-28 DIAGNOSIS — I482 Chronic atrial fibrillation: Secondary | ICD-10-CM

## 2017-12-28 DIAGNOSIS — Z7901 Long term (current) use of anticoagulants: Secondary | ICD-10-CM | POA: Diagnosis not present

## 2017-12-28 DIAGNOSIS — Z8674 Personal history of sudden cardiac arrest: Secondary | ICD-10-CM | POA: Insufficient documentation

## 2017-12-28 DIAGNOSIS — I493 Ventricular premature depolarization: Secondary | ICD-10-CM

## 2017-12-28 DIAGNOSIS — I340 Nonrheumatic mitral (valve) insufficiency: Secondary | ICD-10-CM | POA: Diagnosis not present

## 2017-12-28 DIAGNOSIS — I10 Essential (primary) hypertension: Secondary | ICD-10-CM

## 2017-12-28 DIAGNOSIS — Z8249 Family history of ischemic heart disease and other diseases of the circulatory system: Secondary | ICD-10-CM | POA: Diagnosis not present

## 2017-12-28 DIAGNOSIS — I472 Ventricular tachycardia, unspecified: Secondary | ICD-10-CM

## 2017-12-28 DIAGNOSIS — I5042 Chronic combined systolic (congestive) and diastolic (congestive) heart failure: Secondary | ICD-10-CM | POA: Diagnosis not present

## 2017-12-28 DIAGNOSIS — E785 Hyperlipidemia, unspecified: Secondary | ICD-10-CM | POA: Diagnosis not present

## 2017-12-28 DIAGNOSIS — I442 Atrioventricular block, complete: Secondary | ICD-10-CM

## 2017-12-28 DIAGNOSIS — I5020 Unspecified systolic (congestive) heart failure: Secondary | ICD-10-CM

## 2017-12-28 DIAGNOSIS — I509 Heart failure, unspecified: Secondary | ICD-10-CM | POA: Diagnosis not present

## 2017-12-28 DIAGNOSIS — I447 Left bundle-branch block, unspecified: Secondary | ICD-10-CM | POA: Diagnosis not present

## 2017-12-28 DIAGNOSIS — Z79899 Other long term (current) drug therapy: Secondary | ICD-10-CM | POA: Diagnosis not present

## 2017-12-28 DIAGNOSIS — I4821 Permanent atrial fibrillation: Secondary | ICD-10-CM

## 2017-12-28 LAB — BASIC METABOLIC PANEL
Anion gap: 12 (ref 5–15)
BUN: 26 mg/dL — ABNORMAL HIGH (ref 6–20)
CO2: 24 mmol/L (ref 22–32)
Calcium: 9 mg/dL (ref 8.9–10.3)
Chloride: 99 mmol/L — ABNORMAL LOW (ref 101–111)
Creatinine, Ser: 1.23 mg/dL — ABNORMAL HIGH (ref 0.44–1.00)
GFR calc Af Amer: 47 mL/min — ABNORMAL LOW (ref >60)
GFR calc non Af Amer: 40 mL/min — ABNORMAL LOW (ref >60)
Glucose, Bld: 111 mg/dL — ABNORMAL HIGH (ref 65–99)
Potassium: 3.8 mmol/L (ref 3.5–5.1)
Sodium: 135 mmol/L (ref 135–145)

## 2017-12-28 NOTE — Progress Notes (Signed)
Advanced Heart Failure Clinic Note   Primary Care: Dr Laurann Montana HF Cardiology: Dr. Aundra Dubin  HPI: 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 Cardiomyopathy dates back to at least 2014 based on echoes (EF 30-35% in 2014).   Admitted 8/17 for fatigue and dyspnea.  She was seen by electrophysiology on 04/17/16, and was started on po Amiodarone as she reported increased palpitations and fatigue with frequent PVCs. Her BNP was elevated at 1592.6 and chest x ray was consistent with CHF.  She was diuresed.  With her complaints of new onset fatigue and dyspnea combined with her known LV dysfunction, it was felt that she would benefit from an ischemic evaluation by cath. She underwent left heart cath with normal cors. Amiodarone was later stopped due to nausea.  Discharge weight 143 pounds. Echo (8/17) with EF 40-45%, inferoseptal akinesis. Cardiac MRI 10/17 with EF 36%, LGE mid wall pattern in the basal to mid septum and inferior wall. Holter monitor in 9/17 with runs of atrial fibrillation/RVR.  She has had trouble tolerating cardiac meds due to hypotension/lightheadedness.    She has had difficulty with symptomatic atrial fibrillation. She developed symptomatic atrial fibrillation with RVR again in 10/18, difficult to control rate.  She had TEE-guided DCCV with resumption of NSR. On TEE in rapid atrial fibrillation, EF 20-25%.  On TTE after DCCV, EF back to 40-45% range.  MR reported as moderate to severe but I reviewed echo and think it appears more in the moderate range.    She had recurrent atrial fibrillation after 10/18 DCCV. She was admitted in 12/18 with atrial fibrillation and RVR, she also had a run of WCT thought to be VT.   The atrial fibrillation was very difficult to control.  She was thought to be a poor candidate for atrial fibrillation ablation.  She finally had AV nodal  ablation with Medtronic CRT-D device in 12/18. She was then admitted briefly with CHF in 1/19.  Last echo in 12/18 showed EF 25-30%, moderate MR, moderate TR, severe LAE.   Admitted 4/7 - 12/21/17 with A/C CHF. Found to have decreased BiV pacing in setting of frequent PVCs. Tried on Mexiletine but failed due to nausea. Switched to Ranexa with improved. Diuresed with IV lasix.   She presents today for post hospital follow up.  She is feeling much better. Denies lightheadedness or dizziness. Her DOE has much improved. She is able to ADLs and household chores without SOB. No orthopnea or PND. BP has been stable at home. She is taking all medications as directed  She returns for followup of atrial fibrillation and CHF today.  At last appointment, she was volume overloaded and Lasix was increased.  She feels good today, breathing better overall.  No dyspnea with housework, no dyspnea walking in the grocery store.  BP stable, no lightheadedness.  No chest pain.  No orthopnea/PND.   ICD: Medtronic device Interrogation: Personally reviewed. Thoracic impedence dry. BiV pacing improved since starting on Ranexa and trending up. No VT/Vf. Pt activity ~ 30-45 minutes daily.   Labs (8/17): K 4.2, creatinine 0.85, BNP 1593, HCT 43.2, TSH mild elevated 5.3, free T4 normal Labs (9/17): K 4.1, creatinine 0.98 => 0.92, BNP 258, SPEP negative Labs (2/18): K 4, creatinine 0.83, HCT 40.8 Labs (10/18): K 4.2, creatinine 0.94, hgb 14.9 Labs (1/19): K 4.9, creatinine 0.72 Labs (2/19): K 4.1, creatinine 0.97  1. Chronic systolic CHF:  Nonischemic cardiomyopathy.   - Echo (2014): EF 30-35%. - Echo (2/17): EF 40-45% - Echo (8/17): EF 40-45%, mid to apical inferoseptal akinesis - Coronary angiography (8/17): No significant CAD.  - Cardiac MRI (10/17): EF 36%, moderate MR, normal RV size and systolic function, mid-wall LGE in the basal to mid septum and inferior wall.  - TEE (10/18): EF 20-25% but in rapid afib with HR up  to 150 bpm, mild MR.  - Echo (10/18): EF 40-45%, diffuse hypokinesis, moderate LAE, reportedly moderate-severe MR (looks moderate on my review).  - Echo (12/18): EF 25-30%, moderate MR, moderate TR, severe LAE - Medtronic CRT-D device s/p AV nodal ablation.  2. Atrial fibrillation: Paroxysmal.  Diagnosed 2/17.  Unable to tolerate amiodarone.  - Holter (9/17): atrial fibrillation with RVR runs, 5% PVCs.  - AV nodal ablation with BiV pacing in 12/18.  3. Hyperlipidemia 4. PVCs: frequent.  5. Long QT interval  Review of systems complete and found to be negative unless listed in HPI.    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 Occasional ETOH, never heavy.    Current Outpatient Medications  Medication Sig Dispense Refill  . acetaminophen (TYLENOL) 500 MG tablet Take 500 mg by mouth every 6 (six) hours as needed for moderate pain.    Marland Kitchen atorvastatin (LIPITOR) 40 MG tablet Take 1 tablet (40 mg total) by mouth daily at 6 PM. 30 tablet 0  . bisoprolol (ZEBETA) 10 MG tablet Take 5 mg by mouth 2 (two) times daily.    . calcium carbonate (TUMS - DOSED IN MG ELEMENTAL CALCIUM) 500 MG chewable tablet Chew 1-2 tablets (200-400 mg of elemental calcium total) by mouth as needed for indigestion or heartburn. 30 tablet 0  . docusate sodium (COLACE) 100 MG capsule Take 100 mg by mouth daily as needed for mild constipation.    . fluticasone (FLONASE) 50 MCG/ACT nasal spray Place 1 spray into both nostrils daily as needed for allergies or rhinitis.    . furosemide (LASIX) 20 MG tablet 40 MG IN MORNING AND 20 MG IN EVENING. TAKE AN EXTRA TABLET DAILY AS NEEDED FOR WEIGHT GAIN OF GREATER THAN 3 POUNDS 90 tablet 0  . losartan (COZAAR) 25 MG tablet Take ONE-HALF TABLET BY MOUTH at bedtime 45 tablet 0  . naphazoline-pheniramine (NAPHCON-A) 0.025-0.3 % ophthalmic solution Place 1 drop into both eyes daily as needed (dry eyes).     . polyethylene  glycol (MIRALAX / GLYCOLAX) packet Take 17 g by mouth daily as needed for mild constipation. 14 each 0  . potassium chloride SA (K-DUR,KLOR-CON) 20 MEQ tablet Take 1 tablet (20 mEq total) by mouth daily. 90 tablet 3  . ranolazine (RANEXA) 500 MG 12 hr tablet Take 1 tablet (500 mg total) by mouth 2 (two) times daily. 60 tablet 0  . Rivaroxaban (XARELTO) 15 MG TABS tablet Take 1 tablet (15 mg total) by mouth daily with supper. RESUME ON 2 JAN 30 tablet 11  . zolpidem (AMBIEN) 5 MG tablet Take 1 tablet (5 mg total) by mouth at bedtime. 5 tablet 0   No current facility-administered medications for this encounter.     Vitals:   12/28/17 0953  BP: 104/72  Pulse: 90  SpO2: 98%  Weight: 141 lb 12.8 oz (64.3 kg)   Wt Readings from Last 3 Encounters:  12/28/17 141 lb 12.8 oz (64.3 kg)  12/21/17 138 lb (62.6  kg)  12/10/17 142 lb (64.4 kg)    PHYSICAL EXAM: General: Well appearing. No resp difficulty. HEENT: Normal Neck: Supple. JVP 5-6. Carotids 2+ bilat; no bruits. No thyromegaly or nodule noted. Cor: PMI nondisplaced. RRR, No M/G/R noted Lungs: CTAB, normal effort. Abdomen: Soft, non-tender, non-distended, no HSM. No bruits or masses. +BS  Extremities: No cyanosis, clubbing, or rash. R and LLE no edema.  Neuro: Alert & orientedx3, cranial nerves grossly intact. moves all 4 extremities w/o difficulty. Affect pleasant   ASSESSMENT & PLAN:  1. Chronic systolic HF: NICM, Unknown etiology LHC 04/2016 showed no significant coronary disease. Cardiac MRI 10/17 EF 36%, diffuse HK, normal RV, biatrial enlargement, moderate MR, non-coronary LGE pattern involving the mid-wall of the basal to mid septum and inferior wall. Possibly prior myocarditis versus a form of infiltrative disease.Echo 08/2017 EF 25-30%, diffuse HK, septal dyssynergy, moderate MR, severe LA dilation, moderate RA dilation, moderate TR - Echo 12/17/17: EF 25-30%, severe diffuse HK, mild AI, mild MR, mod systolic dysfunction RV, LA  dilation, RA dilation, moderate TR - Volume status stable on exam and optivol - Continue lasix 40 mg q am and 20 mg qpm. BMET today.  -Continuebisoprolol 5 mg BID -Continuelosartan 12.5 mg qHS. SBP 90s. No room to titrate. - Plans to go to cardiac rehab outpatient once she can drive in June - Reinforced fluid restriction to < 2 L daily, sodium restriction to less than 2000 mg daily, and the importance of daily weights.    3. Chronic Afib s/p AV node ablation and Medtronic CRT-D: Now with frequent PVCs impairing BiV pacing.  - ICD interrogation 4/8 showed no VT, but significant drop in BiV pacing (78%) due to PVC burden  - ICD interrogation today shows improved of BIV pacing back up to > 90% range.  - Continue xarelto and bisoprolol - Need to limit PVCs as much as possible due to concern for worsening cardiomyopathy, and also the PVCs decrease BiV pacing.  - Not a good PVC ablation candidate per EP. Cannot tolerate amio, sotolol, or tikosyn. Did not tolerate mexilitine with nausea - Continue ranexa 500 mg BID.  QTc 583 on EKG today but with LBBB. Will forward to EP. Discussed with Dr. Aundra Dubin who states to follow for now unless EP recommends otherwise due to decrease of BiV pacing with PVCs.   4. Moderate MR on echo 12/18 - Echo 12/17/17: mild MR directed centrally.  - No change to current plan.   5. CKD, stage III, baseline ~1.0 - BMET today  6. Hx ofpulselessVTarrest 08/2017 (during DCCV, required defib + CPR 4-5 min) - Follows with Dr Rayann Heman - No VT on ICD interrogation.  - No driving until 01/931.  - No change to current plan.    7. Abdominal Pain - No further.   Keep 6 week f/u with Dr. Aundra Dubin. Labs today.   Cheryl Friar, PA-C  12/28/2017   Greater than 50% of the 25 minute visit was spent in counseling/coordination of care regarding disease state education, salt/fluid restriction, sliding scale diuretics, and medication compliance.

## 2017-12-28 NOTE — Patient Instructions (Signed)
Routine lab work today. Will notify you of abnormal results, otherwise no news is good news!  Follow up as scheduled.  Take all medication as prescribed the day of your appointment. Bring all medications with you to your appointment.  Do the following things EVERYDAY: 1) Weigh yourself in the morning before breakfast. Write it down and keep it in a log. 2) Take your medicines as prescribed 3) Eat low salt foods-Limit salt (sodium) to 2000 mg per day.  4) Stay as active as you can everyday 5) Limit all fluids for the day to less than 2 liters  

## 2018-01-21 ENCOUNTER — Other Ambulatory Visit: Payer: Self-pay

## 2018-01-21 ENCOUNTER — Encounter (HOSPITAL_COMMUNITY): Payer: Self-pay

## 2018-01-21 ENCOUNTER — Emergency Department (HOSPITAL_COMMUNITY): Payer: PPO

## 2018-01-21 ENCOUNTER — Observation Stay (HOSPITAL_COMMUNITY)
Admission: EM | Admit: 2018-01-21 | Discharge: 2018-01-22 | Disposition: A | Discharge status: Home / Self Care | Payer: PPO | Attending: Internal Medicine | Admitting: Internal Medicine

## 2018-01-21 DIAGNOSIS — I255 Ischemic cardiomyopathy: Secondary | ICD-10-CM | POA: Insufficient documentation

## 2018-01-21 DIAGNOSIS — N183 Chronic kidney disease, stage 3 (moderate): Secondary | ICD-10-CM | POA: Insufficient documentation

## 2018-01-21 DIAGNOSIS — R55 Syncope and collapse: Secondary | ICD-10-CM | POA: Diagnosis not present

## 2018-01-21 DIAGNOSIS — K5900 Constipation, unspecified: Secondary | ICD-10-CM | POA: Diagnosis not present

## 2018-01-21 DIAGNOSIS — I480 Paroxysmal atrial fibrillation: Secondary | ICD-10-CM | POA: Insufficient documentation

## 2018-01-21 DIAGNOSIS — Z888 Allergy status to other drugs, medicaments and biological substances status: Secondary | ICD-10-CM | POA: Insufficient documentation

## 2018-01-21 DIAGNOSIS — Z9889 Other specified postprocedural states: Secondary | ICD-10-CM | POA: Insufficient documentation

## 2018-01-21 DIAGNOSIS — Z96642 Presence of left artificial hip joint: Secondary | ICD-10-CM | POA: Diagnosis not present

## 2018-01-21 DIAGNOSIS — I130 Hypertensive heart and chronic kidney disease with heart failure and stage 1 through stage 4 chronic kidney disease, or unspecified chronic kidney disease: Secondary | ICD-10-CM | POA: Insufficient documentation

## 2018-01-21 DIAGNOSIS — Z79899 Other long term (current) drug therapy: Secondary | ICD-10-CM | POA: Diagnosis not present

## 2018-01-21 DIAGNOSIS — Z8249 Family history of ischemic heart disease and other diseases of the circulatory system: Secondary | ICD-10-CM | POA: Insufficient documentation

## 2018-01-21 DIAGNOSIS — Z7901 Long term (current) use of anticoagulants: Secondary | ICD-10-CM | POA: Diagnosis not present

## 2018-01-21 DIAGNOSIS — I481 Persistent atrial fibrillation: Secondary | ICD-10-CM | POA: Insufficient documentation

## 2018-01-21 DIAGNOSIS — I5042 Chronic combined systolic (congestive) and diastolic (congestive) heart failure: Secondary | ICD-10-CM | POA: Diagnosis not present

## 2018-01-21 DIAGNOSIS — I5041 Acute combined systolic (congestive) and diastolic (congestive) heart failure: Secondary | ICD-10-CM | POA: Diagnosis present

## 2018-01-21 DIAGNOSIS — M1990 Unspecified osteoarthritis, unspecified site: Secondary | ICD-10-CM | POA: Insufficient documentation

## 2018-01-21 DIAGNOSIS — I4891 Unspecified atrial fibrillation: Secondary | ICD-10-CM | POA: Diagnosis present

## 2018-01-21 DIAGNOSIS — I10 Essential (primary) hypertension: Secondary | ICD-10-CM | POA: Diagnosis present

## 2018-01-21 DIAGNOSIS — I340 Nonrheumatic mitral (valve) insufficiency: Secondary | ICD-10-CM | POA: Diagnosis not present

## 2018-01-21 DIAGNOSIS — R197 Diarrhea, unspecified: Secondary | ICD-10-CM | POA: Diagnosis not present

## 2018-01-21 DIAGNOSIS — Z881 Allergy status to other antibiotic agents status: Secondary | ICD-10-CM | POA: Insufficient documentation

## 2018-01-21 DIAGNOSIS — I472 Ventricular tachycardia: Secondary | ICD-10-CM | POA: Insufficient documentation

## 2018-01-21 DIAGNOSIS — E785 Hyperlipidemia, unspecified: Secondary | ICD-10-CM | POA: Insufficient documentation

## 2018-01-21 DIAGNOSIS — R0602 Shortness of breath: Secondary | ICD-10-CM | POA: Diagnosis not present

## 2018-01-21 DIAGNOSIS — Z95810 Presence of automatic (implantable) cardiac defibrillator: Secondary | ICD-10-CM | POA: Insufficient documentation

## 2018-01-21 DIAGNOSIS — I4821 Permanent atrial fibrillation: Secondary | ICD-10-CM | POA: Diagnosis present

## 2018-01-21 LAB — CBC
HCT: 43.6 % (ref 36.0–46.0)
Hemoglobin: 15.5 g/dL — ABNORMAL HIGH (ref 12.0–15.0)
MCH: 32.5 pg (ref 26.0–34.0)
MCHC: 35.6 g/dL (ref 30.0–36.0)
MCV: 91.4 fL (ref 78.0–100.0)
Platelets: 181 10*3/uL (ref 150–400)
RBC: 4.77 MIL/uL (ref 3.87–5.11)
RDW: 14.1 % (ref 11.5–15.5)
WBC: 12.8 10*3/uL — ABNORMAL HIGH (ref 4.0–10.5)

## 2018-01-21 LAB — I-STAT TROPONIN, ED: Troponin i, poc: 0.03 ng/mL (ref 0.00–0.08)

## 2018-01-21 LAB — HEPATIC FUNCTION PANEL
ALT: 17 U/L (ref 14–54)
AST: 22 U/L (ref 15–41)
Albumin: 3.4 g/dL — ABNORMAL LOW (ref 3.5–5.0)
Alkaline Phosphatase: 71 U/L (ref 38–126)
Bilirubin, Direct: 0.4 mg/dL (ref 0.1–0.5)
Indirect Bilirubin: 1.7 mg/dL — ABNORMAL HIGH (ref 0.3–0.9)
Total Bilirubin: 2.1 mg/dL — ABNORMAL HIGH (ref 0.3–1.2)
Total Protein: 6.7 g/dL (ref 6.5–8.1)

## 2018-01-21 LAB — BASIC METABOLIC PANEL
Anion gap: 12 (ref 5–15)
BUN: 22 mg/dL — ABNORMAL HIGH (ref 6–20)
CO2: 26 mmol/L (ref 22–32)
Calcium: 8.8 mg/dL — ABNORMAL LOW (ref 8.9–10.3)
Chloride: 100 mmol/L — ABNORMAL LOW (ref 101–111)
Creatinine, Ser: 1.15 mg/dL — ABNORMAL HIGH (ref 0.44–1.00)
GFR calc Af Amer: 51 mL/min — ABNORMAL LOW (ref >60)
GFR calc non Af Amer: 44 mL/min — ABNORMAL LOW (ref >60)
Glucose, Bld: 143 mg/dL — ABNORMAL HIGH (ref 65–99)
Potassium: 3.6 mmol/L (ref 3.5–5.1)
Sodium: 138 mmol/L (ref 135–145)

## 2018-01-21 LAB — URINALYSIS, ROUTINE W REFLEX MICROSCOPIC
Bilirubin Urine: NEGATIVE
Glucose, UA: NEGATIVE mg/dL
Ketones, ur: NEGATIVE mg/dL
Leukocytes, UA: NEGATIVE
Nitrite: NEGATIVE
Protein, ur: NEGATIVE mg/dL
Specific Gravity, Urine: 1.009 (ref 1.005–1.030)
pH: 7 (ref 5.0–8.0)

## 2018-01-21 LAB — LIPASE, BLOOD: Lipase: 22 U/L (ref 11–51)

## 2018-01-21 MED ORDER — ONDANSETRON HCL 4 MG/2ML IJ SOLN
4.0000 mg | Freq: Once | INTRAMUSCULAR | Status: AC
  Administered 2018-01-21: 4 mg via INTRAVENOUS
  Filled 2018-01-21: qty 2

## 2018-01-21 MED ORDER — RANOLAZINE ER 500 MG PO TB12
500.0000 mg | ORAL_TABLET | Freq: Two times a day (BID) | ORAL | Status: DC
  Administered 2018-01-22 (×2): 500 mg via ORAL
  Filled 2018-01-21 (×2): qty 1

## 2018-01-21 MED ORDER — ONDANSETRON HCL 4 MG/2ML IJ SOLN: 4.0000 mg | Freq: Four times a day (QID) | INTRAMUSCULAR | Status: DC | PRN

## 2018-01-21 MED ORDER — CRANBERRY-VITAMIN C 250-60 MG PO CAPS: ORAL_CAPSULE | Freq: Every day | ORAL | Status: DC | PRN

## 2018-01-21 MED ORDER — FUROSEMIDE 40 MG PO TABS
60.0000 mg | ORAL_TABLET | Freq: Every day | ORAL | Status: DC
  Administered 2018-01-22: 60 mg via ORAL
  Filled 2018-01-21: qty 1

## 2018-01-21 MED ORDER — RIVAROXABAN 15 MG PO TABS
15.0000 mg | ORAL_TABLET | Freq: Every day | ORAL | Status: DC
  Administered 2018-01-22: 15 mg via ORAL
  Filled 2018-01-21: qty 1

## 2018-01-21 MED ORDER — DOCUSATE SODIUM 100 MG PO CAPS: 100.0000 mg | ORAL_CAPSULE | Freq: Every day | ORAL | Status: DC | PRN

## 2018-01-21 MED ORDER — LOSARTAN POTASSIUM 25 MG PO TABS
12.5000 mg | ORAL_TABLET | Freq: Every day | ORAL | Status: DC
  Filled 2018-01-21: qty 1

## 2018-01-21 MED ORDER — CALCIUM CARBONATE ANTACID 500 MG PO CHEW: 1.0000 | CHEWABLE_TABLET | ORAL | Status: DC | PRN

## 2018-01-21 MED ORDER — SODIUM CHLORIDE 0.9% FLUSH
3.0000 mL | Freq: Two times a day (BID) | INTRAVENOUS | Status: DC
  Administered 2018-01-22 (×2): 3 mL via INTRAVENOUS

## 2018-01-21 MED ORDER — ZOLPIDEM TARTRATE 5 MG PO TABS
5.0000 mg | ORAL_TABLET | Freq: Every day | ORAL | Status: DC
  Administered 2018-01-22: 5 mg via ORAL
  Filled 2018-01-21: qty 1

## 2018-01-21 MED ORDER — BISOPROLOL FUMARATE 5 MG PO TABS
5.0000 mg | ORAL_TABLET | Freq: Two times a day (BID) | ORAL | Status: DC
  Administered 2018-01-22 (×2): 5 mg via ORAL
  Filled 2018-01-21 (×2): qty 1

## 2018-01-21 MED ORDER — BISACODYL 5 MG PO TBEC: 5.0000 mg | DELAYED_RELEASE_TABLET | Freq: Every day | ORAL | Status: DC | PRN

## 2018-01-21 MED ORDER — ONDANSETRON HCL 4 MG PO TABS: 4.0000 mg | ORAL_TABLET | Freq: Four times a day (QID) | ORAL | Status: DC | PRN

## 2018-01-21 MED ORDER — FLEET ENEMA 7-19 GM/118ML RE ENEM
1.0000 | ENEMA | Freq: Once | RECTAL | Status: AC
  Administered 2018-01-21: 1 via RECTAL
  Filled 2018-01-21: qty 1

## 2018-01-21 MED ORDER — ACETAMINOPHEN 500 MG PO TABS: 500.0000 mg | ORAL_TABLET | Freq: Four times a day (QID) | ORAL | Status: DC | PRN

## 2018-01-21 MED ORDER — NAPHAZOLINE-PHENIRAMINE 0.025-0.3 % OP SOLN
1.0000 [drp] | Freq: Every day | OPHTHALMIC | Status: DC | PRN
  Filled 2018-01-21: qty 5

## 2018-01-21 MED ORDER — POTASSIUM CHLORIDE CRYS ER 20 MEQ PO TBCR
20.0000 meq | EXTENDED_RELEASE_TABLET | Freq: Every day | ORAL | Status: DC
  Administered 2018-01-22: 20 meq via ORAL
  Filled 2018-01-21: qty 1

## 2018-01-21 MED ORDER — FLUTICASONE PROPIONATE 50 MCG/ACT NA SUSP
1.0000 | Freq: Every day | NASAL | Status: DC | PRN
  Filled 2018-01-21: qty 16

## 2018-01-21 NOTE — ED Provider Notes (Signed)
[facility] EMERGENCY DEPARTMENT Provider Note   CSN: [id number] Arrival date & time: 01/21/18  1502     History   Chief Complaint Chief Complaint  Patient presents with  . Fecal Impaction    HPI Cheryl Vargas is a 83 y.o. female.  83 year old female with prior history of heart failure, mitral regurgitation, cardiomyopathy, atrial fibrillation, and ICD placement presents after near syncopal episode.  Patient reports that she has been constipated for the last 3 to 4 days.  She has tried multiple different at home remedies for same.  Today while she was attempting to have a bowel movement she began to feel lightheaded and weak and dizzy.  She denies actual loss of consciousness, however, she felt like she was about to "pass out."  EMS found her on the floor - she was diaphoretic and was noted to be tachycardic.  She reports mild associated nausea. She denies associated chest pain, fever, abdominal pain, vomiting, or other acute complaint.  The history is provided by the patient.  Near Syncope  This is a new problem. The current episode started 1 to 2 hours ago. The problem occurs rarely. The problem has been gradually improving. Pertinent negatives include no chest pain and no abdominal pain. Nothing aggravates the symptoms. Nothing relieves the symptoms. She has tried nothing for the symptoms. The treatment provided no relief.    Past Medical History:  Diagnosis Date  . Arthritis   . Chronic combined systolic (congestive) and diastolic (congestive) heart failure ([place])   . Mitral regurgitation   . Non-ischemic cardiomyopathy ([place])   . Persistent atrial fibrillation ([place])   . Ventricular tachycardia [facility])     Patient Active Problem List   Diagnosis Date Noted  . CHF exacerbation ([place]) 12/18/2017  . CKD (chronic kidney disease), stage III ([place]) 12/16/2017  . SOB (shortness of breath)   . Abdominal pain 09/18/2017  . CHF (congestive heart failure) ([place])  09/17/2017  . Pulmonary edema   . Cardiac device in situ   . Ventricular tachycardia ([place])   . Cardiac arrest ([place])   . Systolic and diastolic CHF, acute ([place])   . Severe mitral valve regurgitation   . A-fib ([place]) 09/03/2017  . Mitral regurgitation   . Acute on chronic systolic heart failure ([place])   . Non-ischemic cardiomyopathy ([place])   . CHF (congestive heart failure), NYHA class IV ([place]) 04/24/2016  . Dizziness 12/01/2015  . Hypertension 12/01/2015  . Insomnia 12/01/2015  . Paroxysmal atrial fibrillation ([facility])   . Closed left hip fracture ([place]) 03/17/2013  . Cardiomegaly 03/17/2013  . Frequent PVCs 03/17/2013  . Hyperlipidemia 03/17/2013    Past Surgical History:  Procedure Laterality Date  . AV NODE ABLATION N/A 09/07/2017   Procedure: AV NODE ABLATION;  Surgeon: [name], MD;  Location: [place] [facility] CV LAB;  Service: Cardiovascular;  Laterality: N/A;  . BIV ICD INSERTION CRT-D N/A 09/07/2017   Procedure: BIV ICD INSERTION CRT-D;  Surgeon: [name], MD;  Location: [place] CV LAB;  Service: Cardiovascular;  Laterality: N/A;  . BUNIONECTOMY    . CARDIAC CATHETERIZATION     in 2004 at [facility]. "Insignificant blockage" per patient  . CARDIAC CATHETERIZATION N/A 04/25/2016   Procedure: Left Heart Cath and Coronary Angiography;  Surgeon: Nelva Bush, MD;  Location: [place] CV LAB;  Service: Cardiovascular;  Laterality: N/A;  . CARDIOVERSION N/A 06/29/2017   Procedure: CARDIOVERSION;  Surgeon: Larey Dresser, MD;  Location: [facility];  Service: Cardiovascular;  Laterality: N/A;  . CARDIOVERSION N/A 08/28/2017   Procedure: CARDIOVERSION;  Surgeon: Larey Dresser, MD;  Location: [facility] ENDOSCOPY;  Service: Cardiovascular;  Laterality: N/A;  . SHOULDER SURGERY     closed reduction  . TEE WITHOUT CARDIOVERSION N/A 06/29/2017   Procedure: TRANSESOPHAGEAL ECHOCARDIOGRAM (TEE);  Surgeon: Larey Dresser, MD;  Location: [facility] ENDOSCOPY;  Service: Cardiovascular;   Laterality: N/A;  . TONSILLECTOMY    . TOTAL HIP ARTHROPLASTY Left 03/18/2013   Procedure: TOTAL HIP ARTHROPLASTY ANTERIOR APPROACH;  Surgeon: Mauri Pole, MD;  Location: [facility] ORS;  Service: Orthopedics;  Laterality: Left;  . TUBAL LIGATION       OB History   None      Home Medications    Prior to Admission medications   Medication Sig Start Date End Date Taking? Authorizing Provider  acetaminophen (TYLENOL) 500 MG tablet Take 500 mg by mouth every 6 (six) hours as needed for moderate pain.   Yes [provider]  bisacodyl (DULCOLAX) 5 MG EC tablet Take 5 mg by mouth daily as needed for moderate constipation.   Yes [provider]  bisoprolol (ZEBETA) 10 MG tablet Take 5 mg by mouth 2 (two) times daily.   Yes [provider]  calcium carbonate (TUMS - DOSED IN MG ELEMENTAL CALCIUM) 500 MG chewable tablet Chew 1-2 tablets (200-400 mg of elemental calcium total) by mouth as needed for indigestion or heartburn. 09/09/17  Yes Allie Bossier, MD  Cranberry-Vitamin C (AZO CRANBERRY URINARY TRACT PO) Take 1 tablet by mouth daily as needed (uti symptoms).   Yes [provider]  docusate sodium (COLACE) 100 MG capsule Take 100 mg by mouth daily as needed for mild constipation.   Yes [provider]  fluticasone (FLONASE) 50 MCG/ACT nasal spray Place 1 spray into both nostrils daily as needed for allergies or rhinitis.   Yes [provider]  furosemide (LASIX) 20 MG tablet 40 MG IN MORNING AND 20 MG IN EVENING. TAKE AN EXTRA TABLET DAILY AS NEEDED FOR WEIGHT GAIN OF GREATER THAN 3 POUNDS 12/21/17  Yes Lavina Hamman, MD  losartan (COZAAR) 25 MG tablet Take ONE-HALF TABLET BY MOUTH at bedtime 11/28/17  Yes Larey Dresser, MD  naphazoline-pheniramine (NAPHCON-A) 0.025-0.3 % ophthalmic solution Place 1 drop into both eyes daily as needed (dry eyes).    Yes [provider]  potassium chloride SA (K-DUR,KLOR-CON) 20 MEQ tablet Take 1 tablet  (20 mEq total) by mouth daily. 10/24/17  Yes Larey Dresser, MD  ranolazine (RANEXA) 500 MG 12 hr tablet Take 1 tablet (500 mg total) by mouth 2 (two) times daily. 12/21/17  Yes Lavina Hamman, MD  Rivaroxaban (XARELTO) 15 MG TABS tablet Take 1 tablet (15 mg total) by mouth daily with supper. RESUME ON 2 JAN 09/09/17  Yes Allie Bossier, MD  zolpidem (AMBIEN) 5 MG tablet Take 1 tablet (5 mg total) by mouth at bedtime. 09/20/17  Yes Mariel Aloe, MD  polyethylene glycol (MIRALAX / GLYCOLAX) packet Take 17 g by mouth daily as needed for mild constipation. Patient not taking: Reported on 12/28/2017 12/21/17   Lavina Hamman, MD    Family History Family History  Problem Relation Age of Onset  . Lung cancer Mother   . Heart attack Father     Social History Social History   Tobacco Use  . Smoking status: Never Smoker  . Smokeless tobacco: Never Used  Substance Use Topics  . Alcohol use: Yes  Alcohol/week: 1.8 oz    Types: 3 Glasses of wine per week    Comment: per week  . Drug use: No     Allergies   Sotalol; Alendronate sodium; Amiodarone; Ciprofloxacin; Coreg [carvedilol]; and Metoprolol   Review of Systems Review of Systems  Cardiovascular: Positive for near-syncope. Negative for chest pain.  Gastrointestinal: Positive for constipation. Negative for abdominal pain.  Neurological: Positive for light-headedness.  All other systems reviewed and are negative.    Physical Exam Updated Vital Signs BP 123/72   Pulse 82   Temp (!) 97.5 F (36.4 C) (Oral)   Resp 15   SpO2 96%   Physical Exam  Constitutional: She is oriented to person, place, and time. She appears well-developed and well-nourished. No distress.  HENT:  Head: Normocephalic and atraumatic.  Mouth/Throat: Oropharynx is clear and moist.  Eyes: Pupils are equal, round, and reactive to light. Conjunctivae and EOM are normal.  Neck: Normal range of motion. Neck supple.  Cardiovascular: Normal rate, regular  rhythm and normal heart sounds.  Pulmonary/Chest: Effort normal and breath sounds normal. No respiratory distress.  Abdominal: Soft. She exhibits no distension. There is no tenderness.  Genitourinary:  Genitourinary Comments: Rectal exam that was performed following administration of fleets enema and BM shows no impaction.  Musculoskeletal: Normal range of motion. She exhibits no edema or deformity.  Neurological: She is alert and oriented to person, place, and time.  Skin: Skin is warm and dry.  Psychiatric: She has a normal mood and affect.  Nursing note and vitals reviewed.    ED Treatments / Results  Labs (all labs ordered are listed, but only abnormal results are displayed) Labs Reviewed  BASIC METABOLIC PANEL - Abnormal; Notable for the following components:      Result Value   Chloride 100 (*)    Glucose, Bld 143 (*)    BUN 22 (*)    Creatinine, Ser 1.15 (*)    Calcium 8.8 (*)    GFR calc non Af Amer 44 (*)    GFR calc Af Amer 51 (*)    All other components within normal limits  CBC - Abnormal; Notable for the following components:   WBC 12.8 (*)    Hemoglobin 15.5 (*)    All other components within normal limits  URINALYSIS, ROUTINE W REFLEX MICROSCOPIC - Abnormal; Notable for the following components:   Color, Urine AMBER (*)    APPearance HAZY (*)    Hgb urine dipstick SMALL (*)    Bacteria, UA RARE (*)    All other components within normal limits  LIPASE, BLOOD  HEPATIC FUNCTION PANEL  I-STAT TROPONIN, ED    EKG EKG Interpretation  Date/Time:  Monday Jan 21 2018 17:30:30 EDT Ventricular Rate:  86 PR Interval:    QRS Duration: 154 QT Interval:  466 QTC Calculation: 558 R Axis:   125 Text Interpretation:  Atrial fibrillation Nonspecific intraventricular conduction delay Abnormal lateral Q waves Anterior infarct, old Minimal ST depression, inferior leads Confirmed by Dene Gentry [phone]) on 01/21/2018 5:43:57 PM   Radiology Dg Chest Port 1  View  Result Date: 01/21/2018 CLINICAL DATA:  Shortness of breath and constipation EXAM: PORTABLE CHEST 1 VIEW COMPARISON:  12/16/2017 FINDINGS: Chronic cardiomegaly. Dual-chamber pacer/ICD from the left in stable position. Extensive artifact from EKG leads. There is no edema, consolidation, effusion, or pneumothorax. IMPRESSION: No acute finding when compared to prior. Electronically Signed   By: Monte Fantasia M.D.   On: 01/21/2018 20:08  Procedures Procedures (including critical care time)  Medications Ordered in ED Medications  sodium phosphate (FLEET) 7-19 GM/118ML enema 1 enema (1 enema Rectal Given 01/21/18 1845)  ondansetron (ZOFRAN) injection 4 mg (4 mg Intravenous Given 01/21/18 2010)     Initial Impression / Assessment and Plan / ED Course  I have reviewed the triage vital signs and the nursing notes.  Pertinent labs & imaging results that were available during my care of the patient were reviewed by me and considered in my medical decision making (see chart for details).     MDM  Screen complete  Patient is reporting near syncope episode following constipation and attempted bowel movement.  She appears improved upon initial evaluation. No stool impaction found after administration of fleets enema and BM. Screening labs do not reveal acute pathology. EKG does not show acute ischemia. Troponin negative. CXR is without acute findings.  Patient may benefit from overnight observation given extensive cardiac history. I suspect likely vagal episode causing near-syncope given concurrent constipation.    Final Clinical Impressions(s) / ED Diagnoses   Final diagnoses:  Near syncope    ED Discharge Orders    None       Valarie Merino, MD 01/21/18 2153

## 2018-01-21 NOTE — H&P (Signed)
History and Physical    Cheryl Vargas PRF:[id number] DOB: 1935/02/20 DOA: 01/21/2018  Referring MD/NP/PA: Dr Francia Greaves  PCP: Lavone Orn, MD    Patient coming from: home  Chief Complaint: &  HPI: Cheryl Vargas is a 83 y.o. female with medical history significant of ischemic cardiomyopathy with EF of 25-30% from echocardiogram last month also history of chronic kidney disease stage III, atrial fibrillation on chronic anticoagulation, previous cardiac arrest with AICD in place who was brought in secondary to passing out. Patient lives alone. She apparently has been having significant constipation and not able to pass stool. She is to stool softeners but not working. She finally got atelectasis from a neighbor and took it. Patient was in the toilet and was straining when she passed out. When EMS arrived patient was apparently diaphoretic weak and with altered mental status. Patient is now back to baseline.   ED Course: Vitals stable, WBC 12.8, Hemoglobin 15.5, BUN 2, Creatinine 1.15.   Review of Systems: As per HPI otherwise 10 point review of systems negative.   Past Medical History:  Diagnosis Date  . Arthritis   . Chronic combined systolic (congestive) and diastolic (congestive) heart failure ([place])   . Mitral regurgitation   . Non-ischemic cardiomyopathy ([place])   . Persistent atrial fibrillation ([place])   . Ventricular tachycardia [facility])     Past Surgical History:  Procedure Laterality Date  . AV NODE ABLATION N/A 09/07/2017   Procedure: AV NODE ABLATION;  Surgeon: [name], MD;  Location: [place] CV LAB;  Service: Cardiovascular;  Laterality: N/A;  . BIV ICD INSERTION CRT-D N/A 09/07/2017   Procedure: BIV ICD INSERTION CRT-D;  Surgeon: [name], MD;  Location: [place] CV LAB;  Service: Cardiovascular;  Laterality: N/A;  . BUNIONECTOMY    . CARDIAC CATHETERIZATION     in 2004 at [facility]. "Insignificant blockage" per patient  . CARDIAC CATHETERIZATION N/A  04/25/2016   Procedure: Left Heart Cath and Coronary Angiography;  Surgeon: Nelva Bush, MD;  Location: [place] [facility];  Service: Cardiovascular;  Laterality: N/A;  . CARDIOVERSION N/A 06/29/2017   Procedure: CARDIOVERSION;  Surgeon: Larey Dresser, MD;  Location: [facility] ENDOSCOPY;  Service: Cardiovascular;  Laterality: N/A;  . CARDIOVERSION N/A 08/28/2017   Procedure: CARDIOVERSION;  Surgeon: Larey Dresser, MD;  Location[facility] ENDOSCOPY;  Service: Cardiovascular;  Laterality: N/A;  . SHOULDER SURGERY     closed reduction  . TEE WITHOUT CARDIOVERSION N/A 06/29/2017   Procedure: TRANSESOPHAGEAL ECHOCARDIOGRAM (TEE);  Surgeon: Larey Dresser, MD;  Location: [facility] ENDOSCOPY;  Service: Cardiovascular;  Laterality: N/A;  . TONSILLECTOMY    . TOTAL HIP ARTHROPLASTY Left 03/18/2013   Procedure: TOTAL HIP ARTHROPLASTY ANTERIOR APPROACH;  Surgeon: Mauri Pole, MD;  Location: [facility] ORS;  Service: Orthopedics;  Laterality: Left;  . TUBAL LIGATION       reports that she has never smoked. She has never used smokeless tobacco. She reports that she drinks about 1.8 oz of alcohol per week. She reports that she does not use drugs.  Allergies  Allergen Reactions  . Sotalol Other (See Comments)    Prolonged QTc  . Alendronate Sodium Other (See Comments)    Arm pain  . Amiodarone Nausea Only  . Ciprofloxacin Other (See Comments)    Not effective  . Coreg [Carvedilol] Other (See Comments)    Fatigue   . Metoprolol Other (See Comments)    Profound fatigue    Family History  Problem Relation  Age of Onset  . Lung cancer Mother   . Heart attack Father     Prior to Admission medications   Medication Sig Start Date End Date Taking? Authorizing Provider  acetaminophen (TYLENOL) 500 MG tablet Take 500 mg by mouth every 6 (six) hours as needed for moderate pain.   Yes [provider]  bisacodyl (DULCOLAX) 5 MG EC tablet Take 5 mg by mouth daily as needed for moderate constipation.   Yes  [provider]  bisoprolol (ZEBETA) 10 MG tablet Take 5 mg by mouth 2 (two) times daily.   Yes [provider]  calcium carbonate (TUMS - DOSED IN MG ELEMENTAL CALCIUM) 500 MG chewable tablet Chew 1-2 tablets (200-400 mg of elemental calcium total) by mouth as needed for indigestion or heartburn. 09/09/17  Yes Allie Bossier, MD  Cranberry-Vitamin C (AZO CRANBERRY URINARY TRACT PO) Take 1 tablet by mouth daily as needed (uti symptoms).   Yes [provider]  docusate sodium (COLACE) 100 MG capsule Take 100 mg by mouth daily as needed for mild constipation.   Yes [provider]  fluticasone (FLONASE) 50 MCG/ACT nasal spray Place 1 spray into both nostrils daily as needed for allergies or rhinitis.   Yes [provider]  furosemide (LASIX) 20 MG tablet 40 MG IN MORNING AND 20 MG IN EVENING. TAKE AN EXTRA TABLET DAILY AS NEEDED FOR WEIGHT GAIN OF GREATER THAN 3 POUNDS 12/21/17  Yes Lavina Hamman, MD  losartan (COZAAR) 25 MG tablet Take ONE-HALF TABLET BY MOUTH at bedtime 11/28/17  Yes Larey Dresser, MD  naphazoline-pheniramine (NAPHCON-A) 0.025-0.3 % ophthalmic solution Place 1 drop into both eyes daily as needed (dry eyes).    Yes [provider]  potassium chloride SA (K-DUR,KLOR-CON) 20 MEQ tablet Take 1 tablet (20 mEq total) by mouth daily. 10/24/17  Yes Larey Dresser, MD  ranolazine (RANEXA) 500 MG 12 hr tablet Take 1 tablet (500 mg total) by mouth 2 (two) times daily. 12/21/17  Yes Lavina Hamman, MD  Rivaroxaban (XARELTO) 15 MG TABS tablet Take 1 tablet (15 mg total) by mouth daily with supper. RESUME ON 2 JAN 09/09/17  Yes Allie Bossier, MD  zolpidem (AMBIEN) 5 MG tablet Take 1 tablet (5 mg total) by mouth at bedtime. 09/20/17  Yes Mariel Aloe, MD  polyethylene glycol (MIRALAX / GLYCOLAX) packet Take 17 g by mouth daily as needed for mild constipation. Patient not taking: Reported on 12/28/2017 12/21/17   Lavina Hamman, MD     Physical Exam: Vitals:   01/21/18 1930 01/21/18 2000 01/21/18 2030 01/21/18 2100  BP: 117/75 123/72 123/64 115/78  Pulse: 84 82 82 80  Resp: 18 15 16 19   Temp:      TempSrc:      SpO2: 97% 96% 95% 95%      Constitutional: NAD, calm, comfortable Vitals:   01/21/18 1930 01/21/18 2000 01/21/18 2030 01/21/18 2100  BP: 117/75 123/72 123/64 115/78  Pulse: 84 82 82 80  Resp: 18 15 16 19   Temp:      TempSrc:      SpO2: 97% 96% 95% 95%   Eyes: PERRL, lids and conjunctivae normal ENMT: Mucous membranes are moist. Posterior pharynx clear of any exudate or lesions.Normal dentition.  Neck: normal, supple, no masses, no thyromegaly Respiratory: clear to auscultation bilaterally, no wheezing, no crackles. Normal respiratory effort. No accessory muscle use.  Cardiovascular: Irregularly irregular rhythm, SE murmurs / rubs / gallops. No  extremity edema. 2+ pedal pulses. No carotid bruits.  Abdomen: no tenderness, no masses palpated. No hepatosplenomegaly. Bowel sounds positive.  Musculoskeletal: no clubbing / cyanosis. No joint deformity upper and lower extremities. Good ROM, no contractures. Normal muscle tone.  Skin: no rashes, lesions, ulcers. No induration Neurologic: CN 2-12 grossly intact. Sensation intact, DTR normal. Strength 5/5 in all 4.  Psychiatric: Normal judgment and insight. Alert and oriented x 3. Normal mood.   Labs on Admission: I have personally reviewed following labs and imaging studies  CBC: Recent Labs  Lab 01/21/18 1550  WBC 12.8*  HGB 15.5*  HCT 43.6  MCV 91.4  PLT 710   Basic Metabolic Panel: Recent Labs  Lab 01/21/18 1550  NA 138  K 3.6  CL 100*  CO2 26  GLUCOSE 143*  BUN 22*  CREATININE 1.15*  CALCIUM 8.8*   GFR: CrCl cannot be calculated (Unknown ideal weight.). Liver Function Tests: Recent Labs  Lab 01/21/18 2058  AST 22  ALT 17  ALKPHOS 71  BILITOT 2.1*  PROT 6.7  ALBUMIN 3.4*   Recent Labs  Lab 01/21/18 2058  LIPASE 22    No results for input(s): AMMONIA in the last 168 hours. Coagulation Profile: No results for input(s): INR, PROTIME in the last 168 hours. Cardiac Enzymes: No results for input(s): CKTOTAL, CKMB, CKMBINDEX, TROPONINI in the last 168 hours. BNP (last 3 results) No results for input(s): PROBNP in the last 8760 hours. HbA1C: No results for input(s): HGBA1C in the last 72 hours. CBG: No results for input(s): GLUCAP in the last 168 hours. Lipid Profile: No results for input(s): CHOL, HDL, LDLCALC, TRIG, CHOLHDL, LDLDIRECT in the last 72 hours. Thyroid Function Tests: No results for input(s): TSH, T4TOTAL, FREET4, T3FREE, THYROIDAB in the last 72 hours. Anemia Panel: No results for input(s): VITAMINB12, FOLATE, FERRITIN, TIBC, IRON, RETICCTPCT in the last 72 hours. Urine analysis:    Component Value Date/Time   COLORURINE AMBER (A) 01/21/2018 1948   APPEARANCEUR HAZY (A) 01/21/2018 1948   LABSPEC 1.009 01/21/2018 1948   PHURINE 7.0 01/21/2018 1948   GLUCOSEU NEGATIVE 01/21/2018 1948   HGBUR SMALL (A) 01/21/2018 1948   BILIRUBINUR NEGATIVE 01/21/2018 1948   KETONESUR NEGATIVE 01/21/2018 1948   PROTEINUR NEGATIVE 01/21/2018 1948   UROBILINOGEN 0.2 03/17/2013 1423   NITRITE NEGATIVE 01/21/2018 1948   LEUKOCYTESUR NEGATIVE 01/21/2018 1948   Sepsis Labs: @LABRCNTIP (procalcitonin:4,lacticidven:4) )No results found for this or any previous visit (from the past 240 hour(s)).   Radiological Exams on Admission: Dg Chest Port 1 View  Result Date: 01/21/2018 CLINICAL DATA:  Shortness of breath and constipation EXAM: PORTABLE CHEST 1 VIEW COMPARISON:  12/16/2017 FINDINGS: Chronic cardiomegaly. Dual-chamber pacer/ICD from the left in stable position. Extensive artifact from EKG leads. There is no edema, consolidation, effusion, or pneumothorax. IMPRESSION: No acute finding when compared to prior. Electronically Signed   By: Monte Fantasia M.D.   On: 01/21/2018 20:08    EKG: Independently  reviewed. Atrial fibrillation with rate of 86. No specific ST changes Assessment/Plan Principal Problem:   Syncope Active Problems:   Hyperlipidemia   Paroxysmal atrial fibrillation (HCC)   Hypertension   A-fib (HCC)   Systolic and diastolic CHF, acute (HCC)   Constipation    #1 syncope: Patient most likely had vasovagal syncope. She however has extensive cardiac history with arrhythmias and AICD in place. We will admit her for observation. Monitor on telemetry. May consider interrogating her AICD. At this point however she is back to baseline.  #  2 constipation: Patient received multiple laxatives and enema in the ER. She passed a large volume of stool. She more than likely has chronic constipation. At discharge she will need to be on scheduled laxatives.  #3 systolic dysfunction CHF: EF 25-30%. Keep off IV fluids. Monitor on telemetry and continue home regimen including Lasix, beta blockers and ACEI  #4 hyperlipidemia: Continue statin  #5 atrial fibrillation: Continue Xarelto and rate control.   DVT prophylaxis: Xarelto  Code Status: Full  Family Communication: Daughter at bedside  Disposition Plan: Home  Consults called: None  Admission status: Observation  Severity of Illness: The appropriate patient status for this patient is OBSERVATION. Observation status is judged to be reasonable and necessary in order to provide the required intensity of service to ensure the patient's safety. The patient's presenting symptoms, physical exam findings, and initial radiographic and laboratory data in the context of their medical condition is felt to place them at decreased risk for further clinical deterioration. Furthermore, it is anticipated that the patient will be medically stable for discharge from the hospital within 2 midnights of admission. The following factors support the patient status of observation.   " The patient's presenting symptoms include Syncope. " The physical exam  findings include Afib no RVR. " The initial radiographic and laboratory data are Normal labs.     Barbette Merino MD [facility] Pager [phone]  If 7PM-7AM, please contact night-coverage [email or web address] Password [facility]  01/21/2018, 10:04 PM

## 2018-01-21 NOTE — ED Provider Notes (Signed)
MSE was initiated and I personally evaluated the patient and placed orders (if any) at  3:47 PM on Jan 21, 2018.  The patient appears stable so that the remainder of the MSE may be completed by another provider.  Patient placed in Quick Look pathway, seen and evaluated   Chief Complaint: Fecal impaction  HPI:   Patient states that she had a firm stool about 3 days ago.  She has not been able to move her bowels since then.  She feels rectal urgency and a firm stool ball in her bottom.  She has had this in the past.  Patient took magnesium citrate and stool softeners prior to arrival and is now having liquid stool leaking around the stool ball.  She denies abdominal pain but does have tenesmus and cramping.  ROS: Impaction (one)  Physical Exam:   Gen: No distress  Neuro: Awake and Alert  Skin: Warm    Focused Exam: No abdominal tenderness on exam   Initiation of care has begun. The patient has been counseled on the process, plan, and necessity for staying for the completion/evaluation, and the remainder of the medical screening examination    Margarita Mail, PA-C 01/21/18 [place], Nathan, MD 01/21/18 2355

## 2018-01-21 NOTE — ED Notes (Signed)
Patient given sprite.

## 2018-01-21 NOTE — ED Notes (Signed)
X-ray at bedside

## 2018-01-21 NOTE — ED Triage Notes (Signed)
[facility]- pt coming from home. Hx of fecal impaction. Pt disempacts herself at home manually and takes stook softeners. Pt appears to have had a vagal episode, was found to be laying in the floor on EMS arrival. Pale and diaphoretic. 4mg  of zofran, 12 lead showed sinus tach.

## 2018-01-22 DIAGNOSIS — K5900 Constipation, unspecified: Secondary | ICD-10-CM

## 2018-01-22 DIAGNOSIS — R55 Syncope and collapse: Secondary | ICD-10-CM | POA: Diagnosis not present

## 2018-01-22 LAB — CBC WITH DIFFERENTIAL/PLATELET
Basophils Absolute: 0 10*3/uL (ref 0.0–0.1)
Basophils Relative: 0 %
Eosinophils Absolute: 0 10*3/uL (ref 0.0–0.7)
Eosinophils Relative: 0 %
HCT: 40.3 % (ref 36.0–46.0)
Hemoglobin: 14.4 g/dL (ref 12.0–15.0)
Lymphocytes Relative: 10 %
Lymphs Abs: 1.1 10*3/uL (ref 0.7–4.0)
MCH: 32.7 pg (ref 26.0–34.0)
MCHC: 35.7 g/dL (ref 30.0–36.0)
MCV: 91.4 fL (ref 78.0–100.0)
Monocytes Absolute: 1.3 10*3/uL — ABNORMAL HIGH (ref 0.1–1.0)
Monocytes Relative: 12 %
Neutro Abs: 8.4 10*3/uL — ABNORMAL HIGH (ref 1.7–7.7)
Neutrophils Relative %: 78 %
Platelets: 177 10*3/uL (ref 150–400)
RBC: 4.41 MIL/uL (ref 3.87–5.11)
RDW: 14.1 % (ref 11.5–15.5)
WBC: 10.8 10*3/uL — ABNORMAL HIGH (ref 4.0–10.5)

## 2018-01-22 LAB — GLUCOSE, CAPILLARY: Glucose-Capillary: 112 mg/dL — ABNORMAL HIGH (ref 65–99)

## 2018-01-22 LAB — COMPREHENSIVE METABOLIC PANEL
ALT: 14 U/L (ref 14–54)
AST: 20 U/L (ref 15–41)
Albumin: 2.8 g/dL — ABNORMAL LOW (ref 3.5–5.0)
Alkaline Phosphatase: 60 U/L (ref 38–126)
Anion gap: 10 (ref 5–15)
BUN: 24 mg/dL — ABNORMAL HIGH (ref 6–20)
CO2: 27 mmol/L (ref 22–32)
Calcium: 8.5 mg/dL — ABNORMAL LOW (ref 8.9–10.3)
Chloride: 99 mmol/L — ABNORMAL LOW (ref 101–111)
Creatinine, Ser: 1.17 mg/dL — ABNORMAL HIGH (ref 0.44–1.00)
GFR calc Af Amer: 50 mL/min — ABNORMAL LOW (ref >60)
GFR calc non Af Amer: 43 mL/min — ABNORMAL LOW (ref >60)
Glucose, Bld: 129 mg/dL — ABNORMAL HIGH (ref 65–99)
Potassium: 3.4 mmol/L — ABNORMAL LOW (ref 3.5–5.1)
Sodium: 136 mmol/L (ref 135–145)
Total Bilirubin: 1.4 mg/dL — ABNORMAL HIGH (ref 0.3–1.2)
Total Protein: 5.9 g/dL — ABNORMAL LOW (ref 6.5–8.1)

## 2018-01-22 LAB — TSH: TSH: 1.371 u[IU]/mL (ref 0.350–4.500)

## 2018-01-22 LAB — MAGNESIUM: Magnesium: 2.2 mg/dL (ref 1.7–2.4)

## 2018-01-22 MED ORDER — POTASSIUM CHLORIDE CRYS ER 20 MEQ PO TBCR
40.0000 meq | EXTENDED_RELEASE_TABLET | Freq: Once | ORAL | Status: AC
  Administered 2018-01-22: 40 meq via ORAL
  Filled 2018-01-22: qty 2

## 2018-01-22 MED ORDER — DOCUSATE SODIUM 100 MG PO CAPS
100.0000 mg | ORAL_CAPSULE | Freq: Every day | ORAL | Status: DC
Start: 2018-01-23 — End: 2018-01-22

## 2018-01-22 MED ORDER — DOCUSATE SODIUM 100 MG PO CAPS: 100.0000 mg | ORAL_CAPSULE | Freq: Every day | ORAL | 0 refills | Status: DC

## 2018-01-22 NOTE — Progress Notes (Signed)
Telemetry called at [phone] stating that patient had 12 beat run of V-tach. Paged Dr. Eliseo Squires to inform her, orders for K+ replacement are in and she ordered a Mag lab which I will page her the result. Shortly after speaking with Dr. Eliseo Squires , the nurse tech obtained Orthostatic VS on patient. She was not orthostatic, however, her systolic blood pressure lying, sitting, and standing was in the low 80's. Paged Dr. Eliseo Squires to inform her. Pt did deny feeling faint/dizzy.

## 2018-01-22 NOTE — Discharge Instructions (Signed)
Sometimes OTC magnesium supplements also help keep BMs regular (400 mg daily)   ================================================================================== Information on my medicine - XARELTO (Rivaroxaban)  Why was Xarelto prescribed for you? Xarelto was prescribed for you to reduce the risk of a blood clot forming that can cause a stroke if you have a medical condition called atrial fibrillation (a type of irregular heartbeat).  What do you need to know about xarelto ? Take your Xarelto ONCE DAILY at the same time every day with your evening meal. If you have difficulty swallowing the tablet whole, you may crush it and mix in applesauce just prior to taking your dose.  Take Xarelto exactly as prescribed by your doctor and DO NOT stop taking Xarelto without talking to the doctor who prescribed the medication.  Stopping without other stroke prevention medication to take the place of Xarelto may increase your risk of developing a clot that causes a stroke.  Refill your prescription before you run out.  After discharge, you should have regular check-up appointments with your healthcare provider that is prescribing your Xarelto.  In the future your dose may need to be changed if your kidney function or weight changes by a significant amount.  What do you do if you miss a dose? If you are taking Xarelto ONCE DAILY and you miss a dose, take it as soon as you remember on the same day then continue your regularly scheduled once daily regimen the next day. Do not take two doses of Xarelto at the same time or on the same day.   Important Safety Information A possible side effect of Xarelto is bleeding. You should call your healthcare provider right away if you experience any of the following: ? Bleeding from an injury or your nose that does not stop. ? Unusual colored urine (red or dark brown) or unusual colored stools (red or black). ? Unusual bruising for unknown reasons. ? A  serious fall or if you hit your head (even if there is no bleeding).  Some medicines may interact with Xarelto and might increase your risk of bleeding while on Xarelto. To help avoid this, consult your healthcare provider or pharmacist prior to using any new prescription or non-prescription medications, including herbals, vitamins, non-steroidal anti-inflammatory drugs (NSAIDs) and supplements.  This website has more information on Xarelto: [email or web address].[email or web address]

## 2018-01-22 NOTE — Progress Notes (Signed)
Pt discharged home with daughter. AVS given to and reviewed in full with patient. All belongings sent with patient. VSS. BP 90/62 (BP Location: Right Arm)   Pulse 80   Temp 98.1 F (36.7 C) (Oral)   Resp 18   Ht 5\' 5"  (1.651 m)   Wt 65 kg (143 lb 3.2 oz) Comment: scale b  SpO2 94%   BMI 23.83 kg/m

## 2018-01-22 NOTE — Progress Notes (Signed)
PT. With 5 beats of VTach. Resting in bed. No s/s of distress or discomfort noted. On call for [facility] paged to make aware.

## 2018-01-22 NOTE — Progress Notes (Signed)
Pt. Arrived  To unit from ED in stable condition. No s/s of distress or or discomfort noted. No complaint so pain. Pt. Stated that she was given an enema in the ED and is unable to control her bowels. Pt. Oriented to room and placed on telemetry. Call light placed within reach.

## 2018-01-22 NOTE — Plan of Care (Signed)
  Problem: Safety: Goal: Ability to remain free from injury will improve Outcome: Progressing   

## 2018-01-22 NOTE — Progress Notes (Signed)
Arrived at this room to share Advanced Directive information with patient.  Patient not available at the current time.  Will follow up with patient.  Please page as needed.      01/22/18 1208  Clinical Encounter Type  Visited With Patient not available  Visit Type Initial

## 2018-01-22 NOTE — Progress Notes (Signed)
Chief of Staff for AICD Risk analyst on unit  Informed primary RN

## 2018-01-22 NOTE — Plan of Care (Signed)
  Problem: Spiritual Needs Goal: Ability to function at adequate level Outcome: Progressing   Problem: Education: Goal: Knowledge of General Education information will improve Outcome: Progressing   Problem: Health Behavior/Discharge Planning: Goal: Ability to manage health-related needs will improve Outcome: Progressing   Problem: Clinical Measurements: Goal: Ability to maintain clinical measurements within normal limits will improve Outcome: Progressing Goal: Will remain free from infection Outcome: Progressing Goal: Diagnostic test results will improve Outcome: Progressing Goal: Respiratory complications will improve Outcome: Progressing Goal: Cardiovascular complication will be avoided Outcome: Progressing   Problem: Activity: Goal: Risk for activity intolerance will decrease Outcome: Progressing   Problem: Nutrition: Goal: Adequate nutrition will be maintained Outcome: Progressing   Problem: Elimination: Goal: Will not experience complications related to bowel motility Outcome: Progressing Goal: Will not experience complications related to urinary retention Outcome: Progressing   Problem: Pain Managment: Goal: General experience of comfort will improve Outcome: Progressing   Problem: Safety: Goal: Ability to remain free from injury will improve Outcome: Progressing   Problem: Skin Integrity: Goal: Risk for impaired skin integrity will decrease Outcome: Progressing

## 2018-01-22 NOTE — Discharge Summary (Signed)
Physician Discharge Summary  Cheryl Vargas MAU:[id number] DOB: 1934-10-09 DOA: 01/21/2018  PCP: Lavone Orn, MD  Admit date: 01/21/2018 Discharge date: 01/22/2018   Recommendations for Outpatient Follow-Up:   1. Bowel regimen to avoid constipation   Discharge Diagnosis:   Principal Problem:   Syncope Active Problems:   Hyperlipidemia   Paroxysmal atrial fibrillation (HCC)   Hypertension   A-fib (HCC)   Systolic and diastolic CHF, acute ([place])   Constipation   Discharge disposition:  Home  Discharge Condition: Improved.  Diet recommendation: Low sodium, heart healthy.  Wound care: None.   History of Present Illness:   Cheryl Vargas is a 83 y.o. female with medical history significant of ischemic cardiomyopathy with EF of 25-30% from echocardiogram last month also history of chronic kidney disease stage III, atrial fibrillation on chronic anticoagulation, previous cardiac arrest with AICD in place who was brought in secondary to passing out. Patient lives alone. She apparently has been having significant constipation and not able to pass stool. She is to stool softeners but not working. She finally got atelectasis from a neighbor and took it. Patient was in the toilet and was straining when she passed out. When EMS arrived patient was apparently diaphoretic weak and with altered mental status. Patient is now back to baseline.      Hospital Course by Problem:   vasovagal syncope:  -AICD no arrhythmia -patient was constipated and had event while trying to have BM   constipation:  -Patient received multiple laxatives and enema in the ER. She passed a large volume of stool -needs bowel regimen.  Chronic systolic dysfunction CHF:  continue home regimen including Lasix, beta blockers and ACEI   hyperlipidemia: Continue statin  chronic atrial fibrillation: Continue Xarelto and rate control.  Hypokalemia -repleted -Mg normal     Medical Consultants:      None.   Discharge Exam:   Vitals:   01/22/18 1151 01/22/18 1250  BP: (!) 78/52 90/62  Pulse: 80   Resp: 18   Temp: 98.1 F (36.7 C)   SpO2: 94%    Vitals:   01/22/18 0928 01/22/18 0931 01/22/18 1151 01/22/18 1250  BP:   (!) 78/52 90/62  Pulse: 80 79 80   Resp:   18   Temp:   98.1 F (36.7 C)   TempSrc:   Oral   SpO2:   94%   Weight:      Height:        Gen:  NAD    The results of significant diagnostics from this hospitalization (including imaging, microbiology, ancillary and laboratory) are listed below for reference.     Procedures and Diagnostic Studies:   Dg Chest Port 1 View  Result Date: 01/21/2018 CLINICAL DATA:  Shortness of breath and constipation EXAM: PORTABLE CHEST 1 VIEW COMPARISON:  12/16/2017 FINDINGS: Chronic cardiomegaly. Dual-chamber pacer/ICD from the left in stable position. Extensive artifact from EKG leads. There is no edema, consolidation, effusion, or pneumothorax. IMPRESSION: No acute finding when compared to prior. Electronically Signed   By: Monte Fantasia M.D.   On: 01/21/2018 20:08     Labs:   Basic Metabolic Panel: Recent Labs  Lab 01/21/18 1550 01/22/18 0455  NA 138 136  K 3.6 3.4*  CL 100* 99*  CO2 26 27  GLUCOSE 143* 129*  BUN 22* 24*  CREATININE 1.15* 1.17*  CALCIUM 8.8* 8.5*  MG  --  2.2   GFR Estimated Creatinine Clearance: 34.5 mL/min (A) (by C-G formula  based on SCr of 1.17 mg/dL (H)). Liver Function Tests: Recent Labs  Lab 01/21/18 2058 01/22/18 0455  AST 22 20  ALT 17 14  ALKPHOS 71 60  BILITOT 2.1* 1.4*  PROT 6.7 5.9*  ALBUMIN 3.4* 2.8*   Recent Labs  Lab 01/21/18 2058  LIPASE 22   No results for input(s): AMMONIA in the last 168 hours. Coagulation profile No results for input(s): INR, PROTIME in the last 168 hours.  CBC: Recent Labs  Lab 01/21/18 1550 01/22/18 0455  WBC 12.8* 10.8*  NEUTROABS  --  8.4*  HGB 15.5* 14.4  HCT 43.6 40.3  MCV 91.4 91.4  PLT 181 177   Cardiac  Enzymes: No results for input(s): CKTOTAL, CKMB, CKMBINDEX, TROPONINI in the last 168 hours. BNP: Invalid input(s): POCBNP CBG: Recent Labs  Lab 01/22/18 0651  GLUCAP 112*   D-Dimer No results for input(s): DDIMER in the last 72 hours. Hgb A1c No results for input(s): HGBA1C in the last 72 hours. Lipid Profile No results for input(s): CHOL, HDL, LDLCALC, TRIG, CHOLHDL, LDLDIRECT in the last 72 hours. Thyroid function studies Recent Labs    01/22/18 0455  TSH 1.371   Anemia work up No results for input(s): VITAMINB12, FOLATE, FERRITIN, TIBC, IRON, RETICCTPCT in the last 72 hours. Microbiology No results found for this or any previous visit (from the past 240 hour(s)).   Discharge Instructions:   Discharge Instructions    Diet - low sodium heart healthy   Complete by:  As directed    Discharge instructions   Complete by:  As directed    Bowel regimen-- can increase docusate to 2x/day if needed, also use senna or another agent as needed for bowel movements   Increase activity slowly   Complete by:  As directed      Allergies as of 01/22/2018      Reactions   Sotalol Other (See Comments)   Prolonged QTc   Alendronate Sodium Other (See Comments)   Arm pain   Amiodarone Nausea Only   Ciprofloxacin Other (See Comments)   Not effective   Coreg [carvedilol] Other (See Comments)   Fatigue   Metoprolol Other (See Comments)   Profound fatigue      Medication List    STOP taking these medications   polyethylene glycol packet Commonly known as:  MIRALAX / GLYCOLAX     TAKE these medications   acetaminophen 500 MG tablet Commonly known as:  TYLENOL Take 500 mg by mouth every 6 (six) hours as needed for moderate pain.   AZO CRANBERRY URINARY TRACT PO Take 1 tablet by mouth daily as needed (uti symptoms).   bisacodyl 5 MG EC tablet Commonly known as:  DULCOLAX Take 5 mg by mouth daily as needed for moderate constipation.   bisoprolol 10 MG tablet Commonly  known as:  ZEBETA Take 5 mg by mouth 2 (two) times daily.   calcium carbonate 500 MG chewable tablet Commonly known as:  TUMS - dosed in mg elemental calcium Chew 1-2 tablets (200-400 mg of elemental calcium total) by mouth as needed for indigestion or heartburn.   docusate sodium 100 MG capsule Commonly known as:  COLACE Take 1 capsule (100 mg total) by mouth daily. What changed:    when to take this  reasons to take this   fluticasone 50 MCG/ACT nasal spray Commonly known as:  FLONASE Place 1 spray into both nostrils daily as needed for allergies or rhinitis.   furosemide 20 MG tablet Commonly  known as:  LASIX 40 MG IN MORNING AND 20 MG IN EVENING. TAKE AN EXTRA TABLET DAILY AS NEEDED FOR WEIGHT GAIN OF GREATER THAN 3 POUNDS   losartan 25 MG tablet Commonly known as:  COZAAR Take ONE-HALF TABLET BY MOUTH at bedtime   naphazoline-pheniramine 0.025-0.3 % ophthalmic solution Commonly known as:  NAPHCON-A Place 1 drop into both eyes daily as needed (dry eyes).   potassium chloride SA 20 MEQ tablet Commonly known as:  K-DUR,KLOR-CON Take 1 tablet (20 mEq total) by mouth daily.   ranolazine 500 MG 12 hr tablet Commonly known as:  RANEXA Take 1 tablet (500 mg total) by mouth 2 (two) times daily.   Rivaroxaban 15 MG Tabs tablet Commonly known as:  XARELTO Take 1 tablet (15 mg total) by mouth daily with supper. RESUME ON 2 JAN   zolpidem 5 MG tablet Commonly known as:  AMBIEN Take 1 tablet (5 mg total) by mouth at bedtime.         Time coordinating discharge: 25 min  Signed:  Geradine Girt   [facility] 01/22/2018, 1:07 PM

## 2018-02-12 ENCOUNTER — Other Ambulatory Visit: Payer: Self-pay | Admitting: *Deleted

## 2018-02-12 ENCOUNTER — Encounter: Payer: PPO | Admitting: Physician Assistant

## 2018-02-12 NOTE — Patient Outreach (Signed)
High Risk HTA Patient telephone screen attempted. I left a voice mail requesting a return call.  Eulah Pont. Myrtie Neither, MSN, [facility] Gerontological Nurse Practitioner [facility] [phone]

## 2018-02-13 ENCOUNTER — Ambulatory Visit: Payer: PPO | Admitting: Internal Medicine

## 2018-02-13 VITALS — BP 116/60 | HR 92 | Ht 65.0 in | Wt 141.0 lb

## 2018-02-13 DIAGNOSIS — I4821 Permanent atrial fibrillation: Secondary | ICD-10-CM

## 2018-02-13 DIAGNOSIS — I472 Ventricular tachycardia, unspecified: Secondary | ICD-10-CM

## 2018-02-13 DIAGNOSIS — I469 Cardiac arrest, cause unspecified: Secondary | ICD-10-CM | POA: Diagnosis not present

## 2018-02-13 DIAGNOSIS — I5042 Chronic combined systolic (congestive) and diastolic (congestive) heart failure: Secondary | ICD-10-CM

## 2018-02-13 DIAGNOSIS — Z95810 Presence of automatic (implantable) cardiac defibrillator: Secondary | ICD-10-CM | POA: Diagnosis not present

## 2018-02-13 DIAGNOSIS — I482 Chronic atrial fibrillation: Secondary | ICD-10-CM

## 2018-02-13 DIAGNOSIS — E7800 Pure hypercholesterolemia, unspecified: Secondary | ICD-10-CM | POA: Insufficient documentation

## 2018-02-13 NOTE — Patient Instructions (Addendum)
Medication Instructions:  Your physician recommends that you continue on your current medications as directed. Please refer to the Current Medication list given to you today.  Labwork: None ordered.  Testing/Procedures: None ordered.  Follow-Up: Your physician wants you to follow-up in: 3 months with Chanetta Marshall, NP.      Remote monitoring is used to monitor your ICD from home. This monitoring reduces the number of office visits required to check your device to one time per year. It allows [place] to keep an eye on the functioning of your device to ensure it is working properly. You are scheduled for a device check from home on 03/11/2018. You may send your transmission at any time that day. If you have a wireless device, the transmission will be sent automatically. After your physician reviews your transmission, you will receive a postcard with your next transmission date.  Any Other Special Instructions Will Be Listed Below (If Applicable).  If you need a refill on your cardiac medications before your next appointment, please call your pharmacy.

## 2018-02-13 NOTE — Progress Notes (Signed)
PCP: Lavone Orn, MD Primary Cardiologist: Dr Aundra Dubin Primary EP: Dr Micael Hampshire Silvernail is a 83 y.o. female who presents today for routine electrophysiology followup.  Since last being seen in our clinic, the patient reports doing reasonably well.  She was admitted in April with CHF.  She then was admitted in early May with vagal near syncope while straining to have a BM with impaction. Today, she denies symptoms of palpitations, chest pain, shortness of breath,  lower extremity edema, dizziness or ICD shocks.  The patient is otherwise without complaint today.   Past Medical History:  Diagnosis Date  . Arthritis   . Chronic combined systolic (congestive) and diastolic (congestive) heart failure ([place])   . Mitral regurgitation   . Non-ischemic cardiomyopathy ([place])   . Persistent atrial fibrillation ([place])   . Ventricular tachycardia [facility])    Past Surgical History:  Procedure Laterality Date  . AV NODE ABLATION N/A 09/07/2017   Procedure: AV NODE ABLATION;  Surgeon: [name], MD;  Location: [place] CV LAB;  Service: Cardiovascular;  Laterality: N/A;  . BIV ICD INSERTION CRT-D N/A 09/07/2017   Procedure: BIV ICD INSERTION CRT-D;  Surgeon: [name], MD;  Location: [place] CV LAB;  Service: Cardiovascular;  Laterality: N/A;  . BUNIONECTOMY    . CARDIAC CATHETERIZATION     in 2004 at [facility]. "Insignificant blockage" per patient  . CARDIAC CATHETERIZATION N/A 04/25/2016   Procedure: Left Heart Cath and Coronary Angiography;  Surgeon: Nelva Bush, MD;  Location: [place] CV LAB;  Service: Cardiovascular;  Laterality: N/A;  . CARDIOVERSION N/A 06/29/2017   Procedure: CARDIOVERSION;  Surgeon: Larey Dresser, MD;  Location: [facility] ENDOSCOPY;  Service: Cardiovascular;  Laterality: N/A;  . CARDIOVERSION N/A 08/28/2017   Procedure: CARDIOVERSION;  Surgeon: Larey Dresser, MD;  Location: [facility] ENDOSCOPY;  Service: Cardiovascular;  Laterality: N/A;  . SHOULDER SURGERY       closed reduction  . TEE WITHOUT CARDIOVERSION N/A 06/29/2017   Procedure: TRANSESOPHAGEAL ECHOCARDIOGRAM (TEE);  Surgeon: Larey Dresser, MD;  Location: [facility] ENDOSCOPY;  Service: Cardiovascular;  Laterality: N/A;  . TONSILLECTOMY    . TOTAL HIP ARTHROPLASTY Left 03/18/2013   Procedure: TOTAL HIP ARTHROPLASTY ANTERIOR APPROACH;  Surgeon: Mauri Pole, MD;  Location: [facility] ORS;  Service: Orthopedics;  Laterality: Left;  . TUBAL LIGATION      ROS- all systems are reviewed and negative except as per HPI above  Current Outpatient Medications  Medication Sig Dispense Refill  . acetaminophen (TYLENOL) 500 MG tablet Take 500 mg by mouth every 6 (six) hours as needed for moderate pain.    . bisacodyl (DULCOLAX) 5 MG EC tablet Take 5 mg by mouth daily as needed for moderate constipation.    . bisoprolol (ZEBETA) 10 MG tablet Take 5 mg by mouth 2 (two) times daily.    . calcium carbonate (TUMS - DOSED IN MG ELEMENTAL CALCIUM) 500 MG chewable tablet Chew 1-2 tablets (200-400 mg of elemental calcium total) by mouth as needed for indigestion or heartburn. 30 tablet 0  . Cranberry-Vitamin C (AZO CRANBERRY URINARY TRACT PO) Take 1 tablet by mouth daily as needed (uti symptoms).    . docusate sodium (COLACE) 100 MG capsule Take 1 capsule (100 mg total) by mouth daily. 10 capsule 0  . fluticasone (FLONASE) 50 MCG/ACT nasal spray Place 1 spray into both nostrils daily as needed for allergies or rhinitis.    . furosemide (LASIX) 20 MG tablet 40 MG IN MORNING  AND 20 MG IN EVENING. TAKE AN EXTRA TABLET DAILY AS NEEDED FOR WEIGHT GAIN OF GREATER THAN 3 POUNDS 90 tablet 0  . losartan (COZAAR) 25 MG tablet Take ONE-HALF TABLET BY MOUTH at bedtime 45 tablet 0  . naphazoline-pheniramine (NAPHCON-A) 0.025-0.3 % ophthalmic solution Place 1 drop into both eyes daily as needed (dry eyes).     . ranolazine (RANEXA) 500 MG 12 hr tablet Take 1 tablet (500 mg total) by mouth 2 (two) times daily. 60 tablet 0  . Rivaroxaban  (XARELTO) 15 MG TABS tablet Take 1 tablet (15 mg total) by mouth daily with supper. RESUME ON 2 JAN 30 tablet 11  . zolpidem (AMBIEN) 5 MG tablet Take 1 tablet (5 mg total) by mouth at bedtime. 5 tablet 0   No current facility-administered medications for this visit.     Physical Exam: Vitals:   02/13/18 1356  BP: 116/60  Pulse: 92  Weight: 141 lb (64 kg)  Height: 5\' 5"  (1.651 m)    GEN- The patient is elderly and frail appearing, alert and oriented x 3 today.   Head- normocephalic, atraumatic Eyes-  Sclera clear, conjunctiva pink Ears- hearing intact Oropharynx- clear Lungs- Clear to ausculation bilaterally, normal work of breathing Chest- ICD pocket is well healed Heart- Regular rate and rhythm (paced) GI- soft, NT, ND, + BS Extremities- no clubbing, cyanosis, or edema  ICD interrogation- reviewed in detail today,  See PACEART report  ekg tracing ordered today is personally reviewed and shows afib with BiV pacing, PVCs  Wt Readings from Last 3 Encounters:  02/13/18 141 lb (64 kg)  01/22/18 143 lb 3.2 oz (65 kg)  12/28/17 141 lb 12.8 oz (64.3 kg)    Assessment and Plan:  1.  Chronic systolic dysfunction/ nonischemic CM euvolemic today Stable on an appropriate medical regimen Normal ICD function See Pace Art report No changes today Enroll in ICM device clinic  2. Complete heart block S/p AV nodal ablation biV paces 88 % (due to PVCs).  I am not convinced that ranexa is providing benefit.  She has not tolerated sotalol or amiodarone.  If PVC burden does not improve, I would advise stopping ranexa.  Would not advise PVC ablation given advanced age.  3. Permanent afib Continue long term anticoagulation On xarelto but concerned about costs  4. VT Well controlled No changes  [facility] Follow-up in CHF clinic Enroll in [facility] device clinic Return to see EP NP in 3 months  [name] MD, [facility] 02/13/2018 2:08 PM

## 2018-02-19 ENCOUNTER — Other Ambulatory Visit: Payer: Self-pay

## 2018-02-19 ENCOUNTER — Other Ambulatory Visit (HOSPITAL_COMMUNITY): Payer: PPO

## 2018-02-19 ENCOUNTER — Ambulatory Visit (HOSPITAL_COMMUNITY)
Admission: RE | Admit: 2018-02-19 | Discharge: 2018-02-19 | Disposition: A | Discharge status: Home / Self Care | Payer: PPO | Source: Ambulatory Visit | Attending: Cardiology | Admitting: Cardiology

## 2018-02-19 ENCOUNTER — Encounter (HOSPITAL_COMMUNITY): Payer: Self-pay | Admitting: Cardiology

## 2018-02-19 VITALS — BP 117/63 | HR 90 | Wt 142.0 lb

## 2018-02-19 DIAGNOSIS — I480 Paroxysmal atrial fibrillation: Secondary | ICD-10-CM | POA: Insufficient documentation

## 2018-02-19 DIAGNOSIS — I472 Ventricular tachycardia, unspecified: Secondary | ICD-10-CM

## 2018-02-19 DIAGNOSIS — I340 Nonrheumatic mitral (valve) insufficiency: Secondary | ICD-10-CM | POA: Insufficient documentation

## 2018-02-19 DIAGNOSIS — I5042 Chronic combined systolic (congestive) and diastolic (congestive) heart failure: Secondary | ICD-10-CM | POA: Diagnosis not present

## 2018-02-19 DIAGNOSIS — I482 Chronic atrial fibrillation: Secondary | ICD-10-CM | POA: Diagnosis not present

## 2018-02-19 DIAGNOSIS — Z79899 Other long term (current) drug therapy: Secondary | ICD-10-CM | POA: Insufficient documentation

## 2018-02-19 DIAGNOSIS — I5022 Chronic systolic (congestive) heart failure: Secondary | ICD-10-CM | POA: Insufficient documentation

## 2018-02-19 DIAGNOSIS — I447 Left bundle-branch block, unspecified: Secondary | ICD-10-CM | POA: Diagnosis not present

## 2018-02-19 DIAGNOSIS — I110 Hypertensive heart disease with heart failure: Secondary | ICD-10-CM | POA: Diagnosis not present

## 2018-02-19 DIAGNOSIS — Z7901 Long term (current) use of anticoagulants: Secondary | ICD-10-CM | POA: Diagnosis not present

## 2018-02-19 DIAGNOSIS — E785 Hyperlipidemia, unspecified: Secondary | ICD-10-CM | POA: Insufficient documentation

## 2018-02-19 DIAGNOSIS — I4821 Permanent atrial fibrillation: Secondary | ICD-10-CM

## 2018-02-19 DIAGNOSIS — I428 Other cardiomyopathies: Secondary | ICD-10-CM | POA: Diagnosis not present

## 2018-02-19 LAB — BASIC METABOLIC PANEL
Anion gap: 11 (ref 5–15)
BUN: 22 mg/dL — ABNORMAL HIGH (ref 6–20)
CO2: 27 mmol/L (ref 22–32)
Calcium: 9.2 mg/dL (ref 8.9–10.3)
Chloride: 101 mmol/L (ref 101–111)
Creatinine, Ser: 1.45 mg/dL — ABNORMAL HIGH (ref 0.44–1.00)
GFR calc Af Amer: 38 mL/min — ABNORMAL LOW (ref >60)
GFR calc non Af Amer: 33 mL/min — ABNORMAL LOW (ref >60)
Glucose, Bld: 104 mg/dL — ABNORMAL HIGH (ref 65–99)
Potassium: 3.2 mmol/L — ABNORMAL LOW (ref 3.5–5.1)
Sodium: 139 mmol/L (ref 135–145)

## 2018-02-19 LAB — CBC
HCT: 45.2 % (ref 36.0–46.0)
Hemoglobin: 15.6 g/dL — ABNORMAL HIGH (ref 12.0–15.0)
MCH: 32.2 pg (ref 26.0–34.0)
MCHC: 34.5 g/dL (ref 30.0–36.0)
MCV: 93.4 fL (ref 78.0–100.0)
Platelets: 198 10*3/uL (ref 150–400)
RBC: 4.84 MIL/uL (ref 3.87–5.11)
RDW: 13 % (ref 11.5–15.5)
WBC: 6.4 10*3/uL (ref 4.0–10.5)

## 2018-02-19 NOTE — Patient Instructions (Signed)
Cardiac Rehab will call you  Labs drawn today (if we do not call you, then your lab work was stable)   Your physician recommends that you schedule a follow-up appointment in: 2 months with Dr. Aundra Dubin

## 2018-02-20 ENCOUNTER — Telehealth (HOSPITAL_COMMUNITY): Payer: Self-pay | Admitting: *Deleted

## 2018-02-20 DIAGNOSIS — I5022 Chronic systolic (congestive) heart failure: Secondary | ICD-10-CM

## 2018-02-20 MED ORDER — SPIRONOLACTONE 25 MG PO TABS: 12.5000 mg | ORAL_TABLET | Freq: Every day | ORAL | 3 refills | Status: DC

## 2018-02-20 NOTE — Progress Notes (Signed)
Advanced Heart Failure Clinic Note   Primary Care: Dr Laurann Montana HF Cardiology: Dr. 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 Cardiomyopathy dates back to at least 2014 based on echoes (EF 30-35% in 2014).   Admitted 8/17 for fatigue and dyspnea.  She was seen by electrophysiology on 04/17/16, and was started on po Amiodarone as she reported increased palpitations and fatigue with frequent PVCs. Her BNP was elevated at 1592.6 and chest x ray was consistent with CHF.  She was diuresed.  With her complaints of new onset fatigue and dyspnea combined with her known LV dysfunction, it was felt that she would benefit from an ischemic evaluation by cath. She underwent left heart cath with normal cors. Amiodarone was later stopped due to nausea.  Discharge weight 143 pounds. Echo (8/17) with EF 40-45%, inferoseptal akinesis. Cardiac MRI 10/17 with EF 36%, LGE mid wall pattern in the basal to mid septum and inferior wall. Holter monitor in 9/17 with runs of atrial fibrillation/RVR.  She has had trouble tolerating cardiac meds due to hypotension/lightheadedness.    She has had difficulty with symptomatic atrial fibrillation. She developed symptomatic atrial fibrillation with RVR again in 10/18, difficult to control rate.  She had TEE-guided DCCV with resumption of NSR. On TEE in rapid atrial fibrillation, EF 20-25%.  On TTE after DCCV, EF back to 40-45% range.  MR reported as moderate to severe but I reviewed echo and think it appears more in the moderate range.    She had recurrent atrial fibrillation after 10/18 DCCV. She was admitted in 12/18 with atrial fibrillation and RVR, she also had a run of WCT thought to be VT.   The atrial fibrillation was very difficult to control.  She was thought to be a poor candidate for atrial fibrillation ablation.  She finally had AV nodal  ablation with Medtronic CRT-D device in 12/18. She was then admitted briefly with CHF in 1/19.  Last echo in 12/18 showed EF 25-30%, moderate MR, moderate TR, severe LAE.   Admitted 4/7 - 12/21/17 with A/C CHF. Found to have decreased BiV pacing in setting of frequent PVCs. Tried on Mexiletine but failed due to nausea. Switched to Ranexa with improved. Diuresed with IV lasix.   In 5/19, she was admitted with a near-syncopal episode that occurred while straining for a bowel movement.  Suspected vagal event.   She returns for followup of atrial fibrillation and CHF today.  Recent device interrogation still showed only 88% BiV pacing due to PVCs.  Recently, patient was having trouble with dizziness, fatigue, nausea, and vomiting.  She felt miserable.  She decreased bisoprolol to 2.5 mg bid, and the dizziness resolved.  She decrease ranolazine to 250 mg bid and the nausea/vomiting resolved.  She also has a hard time taking KCl. Currently, she feels good.  No significant dyspnea walking on flat ground.  No chest pain.  No orthopnea/PND.   ECG (personally reviewed): atrial fibrillation, BiV pacing, QTc 578 with PVCs  Labs (8/17): K 4.2, creatinine 0.85, BNP 1593, HCT 43.2, TSH mild elevated 5.3, free T4 normal Labs (9/17): K 4.1, creatinine 0.98 => 0.92, BNP 258, SPEP negative Labs (2/18): K 4, creatinine 0.83, HCT 40.8 Labs (10/18): K 4.2, creatinine 0.94, hgb 14.9 Labs (1/19): K 4.9, creatinine 0.72 Labs (2/19): K 4.1, creatinine 0.97 Labs (5/19): K 3.4, creatinine 1.17  1. Chronic  systolic CHF:  Nonischemic cardiomyopathy.   - Echo (2014): EF 30-35%. - Echo (2/17): EF 40-45% - Echo (8/17): EF 40-45%, mid to apical inferoseptal akinesis - Coronary angiography (8/17): No significant CAD.  - Cardiac MRI (10/17): EF 36%, moderate MR, normal RV size and systolic function, mid-wall LGE in the basal to mid septum and inferior wall.  - TEE (10/18): EF 20-25% but in rapid afib with HR up to 150 bpm, mild  MR.  - Echo (10/18): EF 40-45%, diffuse hypokinesis, moderate LAE, reportedly moderate-severe MR (looks moderate on my review).  - Echo (12/18): EF 25-30%, moderate MR, moderate TR, severe LAE - Medtronic CRT-D device s/p AV nodal ablation.  2. Atrial fibrillation: Paroxysmal.  Diagnosed 2/17.  Unable to tolerate amiodarone.  - Holter (9/17): atrial fibrillation with RVR runs, 5% PVCs.  - AV nodal ablation with BiV pacing in 12/18.  - Nausea with ranolazine.  3. Hyperlipidemia 4. PVCs: frequent.  5. Long QT interval  Review of systems complete and found to be negative unless listed in HPI.    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 Occasional ETOH, never heavy.    Current Outpatient Medications  Medication Sig Dispense Refill  . acetaminophen (TYLENOL) 500 MG tablet Take 500 mg by mouth every 6 (six) hours as needed for moderate pain.    . bisacodyl (DULCOLAX) 5 MG EC tablet Take 5 mg by mouth daily as needed for moderate constipation.    . bisoprolol (ZEBETA) 10 MG tablet Take 2.5 mg by mouth 2 (two) times daily.     . calcium carbonate (TUMS - DOSED IN MG ELEMENTAL CALCIUM) 500 MG chewable tablet Chew 1-2 tablets (200-400 mg of elemental calcium total) by mouth as needed for indigestion or heartburn. 30 tablet 0  . Cranberry-Vitamin C (AZO CRANBERRY URINARY TRACT PO) Take 1 tablet by mouth daily as needed (uti symptoms).    . docusate sodium (COLACE) 100 MG capsule Take 1 capsule (100 mg total) by mouth daily. 10 capsule 0  . fluticasone (FLONASE) 50 MCG/ACT nasal spray Place 1 spray into both nostrils daily as needed for allergies or rhinitis.    . furosemide (LASIX) 20 MG tablet 40 MG IN MORNING AND 20 MG IN EVENING. TAKE AN EXTRA TABLET DAILY AS NEEDED FOR WEIGHT GAIN OF GREATER THAN 3 POUNDS 90 tablet 0  . losartan (COZAAR) 25 MG tablet Take ONE-HALF TABLET BY MOUTH at bedtime 45 tablet 0  .  naphazoline-pheniramine (NAPHCON-A) 0.025-0.3 % ophthalmic solution Place 1 drop into both eyes daily as needed (dry eyes).     . ranolazine (RANEXA) 500 MG 12 hr tablet Take 1 tablet (500 mg total) by mouth 2 (two) times daily. (Patient taking differently: Take 250 mg by mouth 2 (two) times daily. ) 60 tablet 0  . Rivaroxaban (XARELTO) 15 MG TABS tablet Take 1 tablet (15 mg total) by mouth daily with supper. RESUME ON 2 JAN 30 tablet 11  . zolpidem (AMBIEN) 5 MG tablet Take 1 tablet (5 mg total) by mouth at bedtime. 5 tablet 0   No current facility-administered medications for this encounter.     Vitals:   02/19/18 1228  BP: 117/63  Pulse: 90  SpO2: 95%  Weight: 142 lb (64.4 kg)   Wt Readings from Last 3 Encounters:  02/19/18 142 lb (64.4 kg)  02/13/18 141 lb (64 kg)  01/22/18 143 lb 3.2 oz (65 kg)  PHYSICAL EXAM: General: NAD Neck: No JVD, no thyromegaly or thyroid nodule.  Lungs: Clear to auscultation bilaterally with normal respiratory effort. CV: Nondisplaced PMI.  Heart regular S1/S2, no S3/S4, no murmur.  No peripheral edema.  No carotid bruit.  Normal pedal pulses.  Abdomen: Soft, nontender, no hepatosplenomegaly, no distention.  Skin: Intact without lesions or rashes.  Neurologic: Alert and oriented x 3.  Psych: Normal affect. Extremities: No clubbing or cyanosis.  HEENT: Normal.   ASSESSMENT & PLAN:  1. Chronic systolic HF: NICM, LHC 12/7423 showed no significant coronary disease. Cardiac MRI 10/17 EF 36%, diffuse HK, normal RV, biatrial enlargement, moderate MR, non-coronary LGE pattern involving the mid-wall of the basal to mid septum and inferior wall. Possibly prior myocarditis versus a form of infiltrative disease. Echo 12/17/17: EF 25-30%, severe diffuse HK, mild AI, mild MR, mod systolic dysfunction RV, LA dilation, RA dilation, moderate TR.  Medtronic CRT-D s/p AV nodal ablation.  She is now volume overloaded on exam, NYHA class II symptoms.  She recently cut back  on bisoprolol due to lightheadedness with improvement in symptoms.  - Continue Lasix 40 qam/20 qpm.   -Continuebisoprolol 2.5 mg BID -Continuelosartan 12.5 mg qHS.  - Check BMET today, if K is low, will add spironolactone 12.5 mg qhs (will have to be careful with this given significant dizziness with medication additions).  - She will be able to go back to driving soon.  I recommended she do cardiac rehab.    2. Atrial fibrillation: Chronic, s/p AV nodal ablation and BiV pacing.  - Continue Xarelto 15 mg daily, check CBC today.  3. PVCs: Frequent PVCs have limited BiV pacing.  Only 88% BiV pacing on interrogation earlier this month.  Not a good PVC ablation candidate per EP. Cannot tolerate amiodarone, sotolol, or Tikosyn. Did not tolerate mexilitine with nausea - Continue ranexa at 250 mg BID, decreased because of nausea.  QTc 578 on EKG today but with LBBB. Not sure that ranolazine is doing much.  4. Mitral regurgitation: Moderate on 12/18 echo. 5. H/o pulseless VT: May start driving again this month.   [name], MD  02/20/2018

## 2018-02-20 NOTE — Telephone Encounter (Signed)
-----   Message from Larey Dresser, MD sent at 02/20/2018 12:50 AM EDT ----- K is low.  She has a hard time with KCl pills.  Would have her take spironolactone 12.5 mg daily with BMET in 7 days.

## 2018-02-26 LAB — CUP PACEART INCLINIC DEVICE CHECK
Date Time Interrogation Session: 20190618172710
HighPow Impedance: 63 Ohm
Implantable Lead Implant Date: 20181228
Implantable Lead Implant Date: 20181228
Implantable Lead Implant Date: 20181228
Implantable Lead Location: 753858
Implantable Lead Location: 753859
Implantable Lead Location: 753860
Implantable Lead Model: 4598
Implantable Lead Model: 5076
Implantable Pulse Generator Implant Date: 20181228
Lead Channel Impedance Value: 399 Ohm
Lead Channel Impedance Value: 513 Ohm
Lead Channel Pacing Threshold Amplitude: 0.5 V
Lead Channel Pacing Threshold Amplitude: 0.75 V
Lead Channel Pacing Threshold Pulse Width: 0.4 ms
Lead Channel Pacing Threshold Pulse Width: 0.4 ms
Lead Channel Setting Pacing Amplitude: 1.25 V
Lead Channel Setting Pacing Amplitude: 2.5 V
Lead Channel Setting Pacing Pulse Width: 0.4 ms
Lead Channel Setting Pacing Pulse Width: 0.4 ms
Lead Channel Setting Sensing Sensitivity: 0.3 mV

## 2018-02-27 ENCOUNTER — Other Ambulatory Visit (HOSPITAL_COMMUNITY): Payer: Self-pay | Admitting: Cardiology

## 2018-02-27 ENCOUNTER — Other Ambulatory Visit: Payer: Self-pay | Admitting: *Deleted

## 2018-02-27 ENCOUNTER — Encounter: Payer: Self-pay | Admitting: *Deleted

## 2018-02-27 MED ORDER — RANOLAZINE ER 500 MG PO TB12: 500.0000 mg | ORAL_TABLET | Freq: Two times a day (BID) | ORAL | 0 refills | Status: DC

## 2018-02-27 NOTE — Patient Outreach (Signed)
Telephone screen for HTA High Risk Patient. I spoke with Mrs. Cheryl Vargas and advised her of [employer] which she states she is not in need of and has declined. I will send her a letter and a brochure.  Eulah Pont. Myrtie Neither, MSN, [facility] Nurse Practitioner [facility] Care Management [phone]

## 2018-02-28 ENCOUNTER — Other Ambulatory Visit (HOSPITAL_COMMUNITY): Payer: Self-pay

## 2018-02-28 ENCOUNTER — Ambulatory Visit (HOSPITAL_COMMUNITY)
Admission: RE | Admit: 2018-02-28 | Discharge: 2018-02-28 | Disposition: A | Discharge status: Home / Self Care | Payer: PPO | Source: Ambulatory Visit | Attending: Cardiology | Admitting: Cardiology

## 2018-02-28 DIAGNOSIS — I5022 Chronic systolic (congestive) heart failure: Secondary | ICD-10-CM | POA: Diagnosis not present

## 2018-02-28 LAB — BASIC METABOLIC PANEL
Anion gap: 9 (ref 5–15)
BUN: 19 mg/dL (ref 6–20)
CO2: 30 mmol/L (ref 22–32)
Calcium: 9.3 mg/dL (ref 8.9–10.3)
Chloride: 100 mmol/L — ABNORMAL LOW (ref 101–111)
Creatinine, Ser: 1.21 mg/dL — ABNORMAL HIGH (ref 0.44–1.00)
GFR calc Af Amer: 48 mL/min — ABNORMAL LOW (ref >60)
GFR calc non Af Amer: 41 mL/min — ABNORMAL LOW (ref >60)
Glucose, Bld: 90 mg/dL (ref 65–99)
Potassium: 3.9 mmol/L (ref 3.5–5.1)
Sodium: 139 mmol/L (ref 135–145)

## 2018-02-28 MED ORDER — FUROSEMIDE 20 MG PO TABS: ORAL_TABLET | ORAL | 1 refills | Status: DC

## 2018-03-06 ENCOUNTER — Encounter: Payer: PPO | Admitting: Nurse Practitioner

## 2018-03-11 ENCOUNTER — Ambulatory Visit (INDEPENDENT_AMBULATORY_CARE_PROVIDER_SITE_OTHER): Payer: PPO | Admitting: *Deleted

## 2018-03-11 DIAGNOSIS — I5022 Chronic systolic (congestive) heart failure: Secondary | ICD-10-CM

## 2018-03-11 DIAGNOSIS — I472 Ventricular tachycardia, unspecified: Secondary | ICD-10-CM

## 2018-03-11 NOTE — Progress Notes (Signed)
Remote ICD transmission.   

## 2018-03-13 ENCOUNTER — Telehealth (HOSPITAL_COMMUNITY): Payer: Self-pay

## 2018-03-13 DIAGNOSIS — G4700 Insomnia, unspecified: Secondary | ICD-10-CM | POA: Diagnosis not present

## 2018-03-13 DIAGNOSIS — I340 Nonrheumatic mitral (valve) insufficiency: Secondary | ICD-10-CM | POA: Diagnosis not present

## 2018-03-13 DIAGNOSIS — I351 Nonrheumatic aortic (valve) insufficiency: Secondary | ICD-10-CM | POA: Diagnosis not present

## 2018-03-13 DIAGNOSIS — I5042 Chronic combined systolic (congestive) and diastolic (congestive) heart failure: Secondary | ICD-10-CM | POA: Diagnosis not present

## 2018-03-13 DIAGNOSIS — Z1389 Encounter for screening for other disorder: Secondary | ICD-10-CM | POA: Diagnosis not present

## 2018-03-13 DIAGNOSIS — Z0000 Encounter for general adult medical examination without abnormal findings: Secondary | ICD-10-CM | POA: Diagnosis not present

## 2018-03-13 DIAGNOSIS — I480 Paroxysmal atrial fibrillation: Secondary | ICD-10-CM | POA: Diagnosis not present

## 2018-03-13 DIAGNOSIS — Z8679 Personal history of other diseases of the circulatory system: Secondary | ICD-10-CM | POA: Diagnosis not present

## 2018-03-13 DIAGNOSIS — M8580 Other specified disorders of bone density and structure, unspecified site: Secondary | ICD-10-CM | POA: Diagnosis not present

## 2018-03-13 NOTE — Telephone Encounter (Signed)
Attempted to follow up with patient in regards to Cardiac Rehab - lm on vm. Closed referral.

## 2018-03-15 LAB — CUP PACEART REMOTE DEVICE CHECK
Battery Remaining Longevity: 95 mo
Battery Voltage: 3.01 V
Brady Statistic AP VP Percent: 0 %
Brady Statistic AP VS Percent: 0 %
Brady Statistic AS VP Percent: 0 %
Brady Statistic AS VS Percent: 0 %
Brady Statistic RA Percent Paced: 0 %
Brady Statistic RV Percent Paced: 96.08 %
Date Time Interrogation Session: 20190701062604
HighPow Impedance: 64 Ohm
Implantable Lead Implant Date: 20181228
Implantable Lead Implant Date: 20181228
Implantable Lead Implant Date: 20181228
Implantable Lead Location: 753858
Implantable Lead Location: 753859
Implantable Lead Location: 753860
Implantable Lead Model: 4598
Implantable Lead Model: 5076
Implantable Pulse Generator Implant Date: 20181228
Lead Channel Impedance Value: 190 Ohm
Lead Channel Impedance Value: 203.256
Lead Channel Impedance Value: 203.256
Lead Channel Impedance Value: 207.273
Lead Channel Impedance Value: 207.273
Lead Channel Impedance Value: 342 Ohm
Lead Channel Impedance Value: 380 Ohm
Lead Channel Impedance Value: 380 Ohm
Lead Channel Impedance Value: 4047 Ohm
Lead Channel Impedance Value: 437 Ohm
Lead Channel Impedance Value: 437 Ohm
Lead Channel Impedance Value: 456 Ohm
Lead Channel Impedance Value: 570 Ohm
Lead Channel Impedance Value: 665 Ohm
Lead Channel Impedance Value: 703 Ohm
Lead Channel Impedance Value: 703 Ohm
Lead Channel Impedance Value: 722 Ohm
Lead Channel Impedance Value: 722 Ohm
Lead Channel Pacing Threshold Amplitude: 0.625 V
Lead Channel Pacing Threshold Amplitude: 0.75 V
Lead Channel Pacing Threshold Pulse Width: 0.4 ms
Lead Channel Pacing Threshold Pulse Width: 0.4 ms
Lead Channel Sensing Intrinsic Amplitude: 11.75 mV
Lead Channel Sensing Intrinsic Amplitude: 11.75 mV
Lead Channel Setting Pacing Amplitude: 1.25 V
Lead Channel Setting Pacing Amplitude: 2.5 V
Lead Channel Setting Pacing Pulse Width: 0.4 ms
Lead Channel Setting Pacing Pulse Width: 0.4 ms
Lead Channel Setting Sensing Sensitivity: 0.3 mV

## 2018-03-27 ENCOUNTER — Telehealth (HOSPITAL_COMMUNITY): Payer: Self-pay

## 2018-03-27 NOTE — Telephone Encounter (Signed)
Patient called to cancel CR as it is time consuming. Patient stated she a lot going on with Dr appts and dental work. Closed referral.

## 2018-03-29 ENCOUNTER — Other Ambulatory Visit (HOSPITAL_COMMUNITY): Payer: Self-pay | Admitting: Cardiology

## 2018-03-29 MED ORDER — RANOLAZINE ER 500 MG PO TB12: ORAL_TABLET | ORAL | 3 refills | Status: DC

## 2018-04-02 ENCOUNTER — Telehealth: Payer: Self-pay

## 2018-04-02 NOTE — Telephone Encounter (Signed)
Received voice mail message from patient to return call.  Will forward to device clinic for call back and provide her with correct number for device clinic [facility] office.

## 2018-04-02 NOTE — Telephone Encounter (Signed)
Ms. Amundson explains that yesterday she noticed her HR was elevated and "jumping" around while checking it on her pulse oximeter. I explained that it would be inaccurate due to her AF history. She is confused about the function of her CRTD, brady pacing and tachy therapies. I explained that the device would not deliver therapy until her HR reached 188bpm or greater, and that it would first attempt to pace her heart out of the arrhythmia; her lower rate is 80bpm but the device is attempting to pace her 100% of the time. She admits that she is still confused. I continue to explain the best I can but she thanks me and disconnects the call.   Margarita Grizzle, it looks like from Dr. Jackalyn Lombard note in June that she was referred to [facility].

## 2018-04-18 ENCOUNTER — Other Ambulatory Visit (HOSPITAL_COMMUNITY): Payer: Self-pay | Admitting: *Deleted

## 2018-04-18 MED ORDER — LOSARTAN POTASSIUM 25 MG PO TABS: 12.5000 mg | ORAL_TABLET | Freq: Every day | ORAL | 3 refills | Status: DC

## 2018-04-23 ENCOUNTER — Ambulatory Visit (HOSPITAL_COMMUNITY): Payer: PPO

## 2018-04-24 ENCOUNTER — Telehealth (HOSPITAL_COMMUNITY): Payer: Self-pay

## 2018-04-24 ENCOUNTER — Ambulatory Visit (HOSPITAL_COMMUNITY)
Admission: RE | Admit: 2018-04-24 | Discharge: 2018-04-24 | Disposition: A | Discharge status: Home / Self Care | Payer: PPO | Source: Ambulatory Visit | Attending: Cardiology | Admitting: Cardiology

## 2018-04-24 VITALS — BP 98/65 | HR 88 | Wt 139.0 lb

## 2018-04-24 DIAGNOSIS — I340 Nonrheumatic mitral (valve) insufficiency: Secondary | ICD-10-CM | POA: Diagnosis not present

## 2018-04-24 DIAGNOSIS — Z79899 Other long term (current) drug therapy: Secondary | ICD-10-CM | POA: Insufficient documentation

## 2018-04-24 DIAGNOSIS — I493 Ventricular premature depolarization: Secondary | ICD-10-CM | POA: Insufficient documentation

## 2018-04-24 DIAGNOSIS — I110 Hypertensive heart disease with heart failure: Secondary | ICD-10-CM | POA: Diagnosis not present

## 2018-04-24 DIAGNOSIS — Z7901 Long term (current) use of anticoagulants: Secondary | ICD-10-CM | POA: Diagnosis not present

## 2018-04-24 DIAGNOSIS — I5020 Unspecified systolic (congestive) heart failure: Secondary | ICD-10-CM | POA: Diagnosis not present

## 2018-04-24 DIAGNOSIS — I5022 Chronic systolic (congestive) heart failure: Secondary | ICD-10-CM | POA: Diagnosis not present

## 2018-04-24 DIAGNOSIS — I429 Cardiomyopathy, unspecified: Secondary | ICD-10-CM | POA: Diagnosis not present

## 2018-04-24 LAB — BASIC METABOLIC PANEL
Anion gap: 10 (ref 5–15)
BUN: 27 mg/dL — ABNORMAL HIGH (ref 8–23)
CO2: 28 mmol/L (ref 22–32)
Calcium: 9.1 mg/dL (ref 8.9–10.3)
Chloride: 100 mmol/L (ref 98–111)
Creatinine, Ser: 1.14 mg/dL — ABNORMAL HIGH (ref 0.44–1.00)
GFR calc Af Amer: 51 mL/min — ABNORMAL LOW (ref >60)
GFR calc non Af Amer: 44 mL/min — ABNORMAL LOW (ref >60)
Glucose, Bld: 105 mg/dL — ABNORMAL HIGH (ref 70–99)
Potassium: 3.7 mmol/L (ref 3.5–5.1)
Sodium: 138 mmol/L (ref 135–145)

## 2018-04-24 MED ORDER — BISOPROLOL FUMARATE 5 MG PO TABS: ORAL_TABLET | ORAL | 3 refills | Status: DC

## 2018-04-24 NOTE — Telephone Encounter (Signed)
Medication Samples have been provided to the patient.  Drug name: Xarelto       Strength: 15mg         Qty: 2  LOT: 04/21  Exp.Date: [id number]  Dosing instructions: Take 1 tablet (15 mg total) by mouth daily with supper.   The patient has been instructed regarding the correct time, dose, and frequency of taking this medication, including desired effects and most common side effects.   Cheryl Vargas 11:36 AM 04/24/2018

## 2018-04-24 NOTE — Patient Instructions (Signed)
Increase Bisoprolol to 2.5 mg in AM and 5 mg in PM  Lab today  Your physician recommends that you schedule a follow-up appointment in: 3 months

## 2018-04-24 NOTE — Progress Notes (Signed)
Advanced Heart Failure Clinic Note   Primary Care: Dr Laurann Montana HF Cardiology: Dr. Aundra Dubin  HPI: 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 Cardiomyopathy dates back to at least 2014 based on echoes (EF 30-35% in 2014).   Admitted 8/17 for fatigue and dyspnea.  She was seen by electrophysiology on 04/17/16, and was started on po Amiodarone as she reported increased palpitations and fatigue with frequent PVCs. Her BNP was elevated at 1592.6 and chest x ray was consistent with CHF.  She was diuresed.  With her complaints of new onset fatigue and dyspnea combined with her known LV dysfunction, it was felt that she would benefit from an ischemic evaluation by cath. She underwent left heart cath with normal cors. Amiodarone was later stopped due to nausea.  Discharge weight 143 pounds. Echo (8/17) with EF 40-45%, inferoseptal akinesis. Cardiac MRI 10/17 with EF 36%, LGE mid wall pattern in the basal to mid septum and inferior wall. Holter monitor in 9/17 with runs of atrial fibrillation/RVR.  She has had trouble tolerating cardiac meds due to hypotension/lightheadedness.    She has had difficulty with symptomatic atrial fibrillation. She developed symptomatic atrial fibrillation with RVR again in 10/18, difficult to control rate.  She had TEE-guided DCCV with resumption of NSR. On TEE in rapid atrial fibrillation, EF 20-25%.  On TTE after DCCV, EF back to 40-45% range.  MR reported as moderate to severe but I reviewed echo and think it appears more in the moderate range.    She had recurrent atrial fibrillation after 10/18 DCCV. She was admitted in 12/18 with atrial fibrillation and RVR, she also had a run of WCT thought to be VT.   The atrial fibrillation was very difficult to control.  She was thought to be a poor candidate for atrial fibrillation ablation.  She finally had AV nodal  ablation with Medtronic CRT-D device in 12/18. She was then admitted briefly with CHF in 1/19.  Echo in 12/18 showed EF 25-30%, moderate MR, moderate TR, severe LAE.  Echo in 4/19 showed EF 25-30%, diffuse hypokinesis, mild MR, moderately decreased RV systolic function.   Admitted 4/7 - 12/21/17 with A/C CHF. Found to have decreased BiV pacing in setting of frequent PVCs. Tried on Mexiletine but failed due to nausea. Switched to Ranexa. Diuresed with IV lasix.   In 5/19, she was admitted with a near-syncopal episode that occurred while straining for a bowel movement.  Suspected vagal event.   She has stopped ranolazine due to nausea.   She returns for followup of atrial fibrillation and CHF today.  Symptomatically, she is fairly stable.  She continues to do better than prior to BiV pacing.  She is short of breath walking around large stores.  No further lightheadedness or syncope.  No chest pain.  She does ok walking around the house but sometimes is short of breath showering.  She chronically sleeps on 2 pillows.  No PND.    ECG (personally reviewed): atrial fibrillation, BiV pacing, QTc 560 with PVCs  Medtronic device interrogation: Optivol with stable impedance, about 90% BiV pacing (likely low due to PVCs).   Labs (8/17): K 4.2, creatinine 0.85, BNP 1593, HCT 43.2, TSH mild elevated 5.3, free T4 normal Labs (9/17): K 4.1, creatinine 0.98 => 0.92, BNP 258, SPEP negative Labs (2/18): K 4, creatinine 0.83, HCT 40.8 Labs (10/18): K 4.2, creatinine 0.94, hgb  14.9 Labs (1/19): K 4.9, creatinine 0.72 Labs (2/19): K 4.1, creatinine 0.97 Labs (5/19): K 3.4, creatinine 1.17 Labs (6/19): K 3.9, creatinine 1.2  1. Chronic systolic CHF:  Nonischemic cardiomyopathy.   - Echo (2014): EF 30-35%. - Echo (2/17): EF 40-45% - Echo (8/17): EF 40-45%, mid to apical inferoseptal akinesis - Coronary angiography (8/17): No significant CAD.  - Cardiac MRI (10/17): EF 36%, moderate MR, normal RV size and  systolic function, mid-wall LGE in the basal to mid septum and inferior wall.  - TEE (10/18): EF 20-25% but in rapid afib with HR up to 150 bpm, mild MR.  - Echo (10/18): EF 40-45%, diffuse hypokinesis, moderate LAE, reportedly moderate-severe MR (looks moderate on my review).  - Echo (12/18): EF 25-30%, moderate MR, moderate TR, severe LAE - Medtronic CRT-D device s/p AV nodal ablation.  - Echo (4/19): EF 25-30%, diffuse hypokinesis, mild MR, moderately decreased RV systolic function.  2. Atrial fibrillation: Paroxysmal.  Diagnosed 2/17.  Unable to tolerate amiodarone.  - Holter (9/17): atrial fibrillation with RVR runs, 5% PVCs.  - AV nodal ablation with BiV pacing in 12/18.  - Nausea with ranolazine.  3. Hyperlipidemia 4. PVCs: frequent.  5. Long QT interval  Review of systems complete and found to be negative unless listed in HPI.    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 Occasional ETOH, never heavy.    Current Outpatient Medications  Medication Sig Dispense Refill  . acetaminophen (TYLENOL) 500 MG tablet Take 500 mg by mouth every 6 (six) hours as needed for moderate pain.    . bisacodyl (DULCOLAX) 5 MG EC tablet Take 5 mg by mouth daily as needed for moderate constipation.    . bisoprolol (ZEBETA) 5 MG tablet Take 2.5 mg in AM and 5 mg in PM 45 tablet 3  . calcium carbonate (TUMS - DOSED IN MG ELEMENTAL CALCIUM) 500 MG chewable tablet Chew 1-2 tablets (200-400 mg of elemental calcium total) by mouth as needed for indigestion or heartburn. 30 tablet 0  . fluticasone (FLONASE) 50 MCG/ACT nasal spray Place 1 spray into both nostrils daily as needed for allergies or rhinitis.    . furosemide (LASIX) 20 MG tablet 40 MG IN MORNING AND 20 MG IN EVENING. TAKE AN EXTRA TABLET DAILY AS NEEDED FOR WEIGHT GAIN OF GREATER THAN 3 POUNDS 90 tablet 1  . losartan (COZAAR) 25 MG tablet Take 0.5 tablets (12.5 mg total) by mouth at  bedtime. 45 tablet 3  . naphazoline-pheniramine (NAPHCON-A) 0.025-0.3 % ophthalmic solution Place 1 drop into both eyes daily as needed (dry eyes).     . Rivaroxaban (XARELTO) 15 MG TABS tablet Take 1 tablet (15 mg total) by mouth daily with supper. RESUME ON 2 JAN 30 tablet 11  . spironolactone (ALDACTONE) 25 MG tablet Take 0.5 tablets (12.5 mg total) by mouth daily. 45 tablet 3  . zolpidem (AMBIEN) 5 MG tablet Take 1 tablet (5 mg total) by mouth at bedtime. 5 tablet 0   No current facility-administered medications for this encounter.     Vitals:   04/24/18 1045  BP: 98/65  Pulse: 88  SpO2: 96%  Weight: 63 kg (139 lb)   Wt Readings from Last 3 Encounters:  04/24/18 63 kg (139 lb)  02/19/18 64.4 kg (142 lb)  02/13/18 64 kg (141 lb)    PHYSICAL EXAM: General: NAD Neck: No JVD, no thyromegaly or thyroid nodule.  Lungs: Clear to auscultation bilaterally with normal respiratory effort. CV: Nondisplaced PMI.  Heart regular S1/S2, no S3/S4, no murmur.  No peripheral edema.  No carotid bruit.  Normal pedal pulses.  Abdomen: Soft, nontender, no hepatosplenomegaly, no distention.  Skin: Intact without lesions or rashes.  Neurologic: Alert and oriented x 3.  Psych: Normal affect. Extremities: No clubbing or cyanosis.  HEENT: Normal.   ASSESSMENT & PLAN:  1. Chronic systolic HF: NICM, LHC 03/5915 showed no significant coronary disease. Cardiac MRI 10/17 EF 36%, diffuse HK, normal RV, biatrial enlargement, moderate MR, non-coronary LGE pattern involving the mid-wall of the basal to mid septum and inferior wall. Possibly prior myocarditis versus a form of infiltrative disease. Echo 12/17/17: EF 25-30%, severe diffuse HK, mild AI, mild MR, mod systolic dysfunction RV, LA dilation, RA dilation, moderate TR.  Medtronic CRT-D s/p AV nodal ablation.  NYHA class III symptoms but she does not appear volume overloaded on exam or by Optivol. Weight down 3 lbs.    - Continue Lasix 40 qam/20 qpm.     -Increase bisoprolol to 2.5 qam/5 qpm.  -Continuelosartan 12.5 mg qHS.  - Continue spironolactone 12.5 daily.  - BMET today.     2. Atrial fibrillation: Chronic, s/p AV nodal ablation and BiV pacing.  - Continue Xarelto 15 mg daily 3. PVCs: Frequent PVCs have limited BiV pacing.  Only about 90% BiV pacing on interrogation today. Not a good PVC ablation candidate per EP. Cannot tolerate amiodarone, sotolol, or Tikosyn. Did not tolerate mexilitine with nausea. Most recently, stopped ranolazine due to nausea.  - I am not sure we have a lot of further options here to suppress her PVCs. As above, will continue to try to gradually increase her bisoprolol.  4. Mitral regurgitation: Mild on 4/19 echo. 5. H/o pulseless VT: Has ICD.   Followup in 3 months.   [name], MD  04/24/2018

## 2018-05-01 ENCOUNTER — Ambulatory Visit (HOSPITAL_COMMUNITY): Payer: PPO

## 2018-05-03 ENCOUNTER — Encounter (HOSPITAL_COMMUNITY): Payer: Self-pay

## 2018-05-03 ENCOUNTER — Ambulatory Visit (HOSPITAL_COMMUNITY): Payer: PPO

## 2018-05-06 ENCOUNTER — Ambulatory Visit (HOSPITAL_COMMUNITY): Payer: PPO

## 2018-05-08 ENCOUNTER — Ambulatory Visit (HOSPITAL_COMMUNITY): Payer: PPO

## 2018-05-10 ENCOUNTER — Ambulatory Visit (HOSPITAL_COMMUNITY): Payer: PPO

## 2018-05-15 ENCOUNTER — Ambulatory Visit (HOSPITAL_COMMUNITY): Payer: PPO

## 2018-05-16 ENCOUNTER — Other Ambulatory Visit: Payer: Self-pay

## 2018-05-17 ENCOUNTER — Ambulatory Visit (HOSPITAL_COMMUNITY): Payer: PPO

## 2018-05-20 ENCOUNTER — Ambulatory Visit (HOSPITAL_COMMUNITY): Payer: PPO

## 2018-05-22 ENCOUNTER — Ambulatory Visit (HOSPITAL_COMMUNITY): Payer: PPO

## 2018-05-24 ENCOUNTER — Ambulatory Visit (HOSPITAL_COMMUNITY): Payer: PPO

## 2018-05-27 ENCOUNTER — Ambulatory Visit (HOSPITAL_COMMUNITY): Payer: PPO

## 2018-05-29 ENCOUNTER — Ambulatory Visit (HOSPITAL_COMMUNITY): Payer: PPO

## 2018-05-31 ENCOUNTER — Ambulatory Visit (HOSPITAL_COMMUNITY): Payer: PPO

## 2018-06-03 ENCOUNTER — Ambulatory Visit (HOSPITAL_COMMUNITY): Payer: PPO

## 2018-06-03 ENCOUNTER — Other Ambulatory Visit (HOSPITAL_COMMUNITY): Payer: Self-pay | Admitting: Cardiology

## 2018-06-03 ENCOUNTER — Other Ambulatory Visit (HOSPITAL_COMMUNITY): Payer: Self-pay

## 2018-06-03 MED ORDER — FUROSEMIDE 20 MG PO TABS: ORAL_TABLET | ORAL | 1 refills | Status: DC

## 2018-06-05 ENCOUNTER — Ambulatory Visit (HOSPITAL_COMMUNITY): Payer: PPO

## 2018-06-07 ENCOUNTER — Ambulatory Visit (HOSPITAL_COMMUNITY): Payer: PPO

## 2018-06-10 ENCOUNTER — Ambulatory Visit (INDEPENDENT_AMBULATORY_CARE_PROVIDER_SITE_OTHER): Payer: PPO | Admitting: *Deleted

## 2018-06-10 ENCOUNTER — Ambulatory Visit (HOSPITAL_COMMUNITY): Payer: PPO

## 2018-06-10 DIAGNOSIS — I472 Ventricular tachycardia, unspecified: Secondary | ICD-10-CM

## 2018-06-11 NOTE — Progress Notes (Signed)
Remote ICD transmission.   

## 2018-06-12 ENCOUNTER — Ambulatory Visit (HOSPITAL_COMMUNITY): Payer: PPO

## 2018-06-14 ENCOUNTER — Ambulatory Visit (HOSPITAL_COMMUNITY): Payer: PPO

## 2018-06-14 LAB — CUP PACEART REMOTE DEVICE CHECK
Battery Remaining Longevity: 92 mo
Battery Voltage: 2.99 V
Brady Statistic AP VP Percent: 0 %
Brady Statistic AP VS Percent: 0 %
Brady Statistic AS VP Percent: 0 %
Brady Statistic AS VS Percent: 0 %
Brady Statistic RA Percent Paced: 0 %
Brady Statistic RV Percent Paced: 95.52 %
Date Time Interrogation Session: 20190930062605
HighPow Impedance: 67 Ohm
Implantable Lead Implant Date: 20181228
Implantable Lead Implant Date: 20181228
Implantable Lead Implant Date: 20181228
Implantable Lead Location: 753858
Implantable Lead Location: 753859
Implantable Lead Location: 753860
Implantable Lead Model: 4598
Implantable Lead Model: 5076
Implantable Pulse Generator Implant Date: 20181228
Lead Channel Impedance Value: 184.154
Lead Channel Impedance Value: 191.854
Lead Channel Impedance Value: 191.854
Lead Channel Impedance Value: 208.568
Lead Channel Impedance Value: 208.568
Lead Channel Impedance Value: 342 Ohm
Lead Channel Impedance Value: 342 Ohm
Lead Channel Impedance Value: 399 Ohm
Lead Channel Impedance Value: 399 Ohm
Lead Channel Impedance Value: 4047 Ohm
Lead Channel Impedance Value: 437 Ohm
Lead Channel Impedance Value: 437 Ohm
Lead Channel Impedance Value: 551 Ohm
Lead Channel Impedance Value: 646 Ohm
Lead Channel Impedance Value: 665 Ohm
Lead Channel Impedance Value: 665 Ohm
Lead Channel Impedance Value: 722 Ohm
Lead Channel Impedance Value: 722 Ohm
Lead Channel Pacing Threshold Amplitude: 0.5 V
Lead Channel Pacing Threshold Amplitude: 0.625 V
Lead Channel Pacing Threshold Pulse Width: 0.4 ms
Lead Channel Pacing Threshold Pulse Width: 0.4 ms
Lead Channel Sensing Intrinsic Amplitude: 16.125 mV
Lead Channel Sensing Intrinsic Amplitude: 16.125 mV
Lead Channel Setting Pacing Amplitude: 1 V
Lead Channel Setting Pacing Amplitude: 2.5 V
Lead Channel Setting Pacing Pulse Width: 0.4 ms
Lead Channel Setting Pacing Pulse Width: 0.4 ms
Lead Channel Setting Sensing Sensitivity: 0.3 mV

## 2018-06-17 ENCOUNTER — Ambulatory Visit (HOSPITAL_COMMUNITY): Payer: PPO

## 2018-06-19 ENCOUNTER — Ambulatory Visit (HOSPITAL_COMMUNITY): Payer: PPO

## 2018-06-19 NOTE — Progress Notes (Signed)
Electrophysiology Office Note Date: 06/20/2018  ID:  Cheryl Vargas, DOB [birth year], MRN [id number]  PCP: Lavone Orn, MD Primary Cardiologist: Aundra Dubin Electrophysiologist: Allred  CC: Routine ICD follow-up  Cheryl Vargas is a 83 y.o. female seen today for Dr Rayann Heman.  She presents today for routine electrophysiology followup.  Since last being seen in our clinic, the patient reports doing reasonably well.  She is tolerating meds without difficulties. She has cut back on her Lasix and Bisoprolol and feels much improved. She is followed closely in the AHF clinic. She denies chest pain, palpitations, dyspnea (above baseline), PND, orthopnea, nausea, vomiting, dizziness, syncope, edema, weight gain, or early satiety.  She has not had ICD shocks.   Device History: MDT CRTD implanted 2018 for complete heart block and VT History of appropriate therapy: No History of AAD therapy: Yes - amio and mexiletine caused nausea  Past Medical History:  Diagnosis Date  . Arthritis   . Chronic combined systolic (congestive) and diastolic (congestive) heart failure ([place])   . Mitral regurgitation   . Non-ischemic cardiomyopathy ([place])   . Persistent atrial fibrillation   . Ventricular tachycardia [facility])    Past Surgical History:  Procedure Laterality Date  . AV NODE ABLATION N/A 09/07/2017   Procedure: AV NODE ABLATION;  Surgeon: [name], MD;  Location: [place] CV LAB;  Service: Cardiovascular;  Laterality: N/A;  . BIV ICD INSERTION CRT-D N/A 09/07/2017   Procedure: BIV ICD INSERTION CRT-D;  Surgeon: [name], MD;  Location: [place] CV LAB;  Service: Cardiovascular;  Laterality: N/A;  . BUNIONECTOMY    . CARDIAC CATHETERIZATION     in 2004 at [facility]. "Insignificant blockage" per patient  . CARDIAC CATHETERIZATION N/A 04/25/2016   Procedure: Left Heart Cath and Coronary Angiography;  Surgeon: Nelva Bush, MD;  Location: [place] CV LAB;  Service: Cardiovascular;   Laterality: N/A;  . CARDIOVERSION N/A 06/29/2017   Procedure: CARDIOVERSION;  Surgeon: Larey Dresser, MD;  Location: [facility] ENDOSCOPY;  Service: Cardiovascular;  Laterality: N/A;  . CARDIOVERSION N/A 08/28/2017   Procedure: CARDIOVERSION;  Surgeon: Larey Dresser, MD;  Location: [facility] ENDOSCOPY;  Service: Cardiovascular;  Laterality: N/A;  . SHOULDER SURGERY     closed reduction  . TEE WITHOUT CARDIOVERSION N/A 06/29/2017   Procedure: TRANSESOPHAGEAL ECHOCARDIOGRAM (TEE);  Surgeon: Larey Dresser, MD;  Location: [facility] ENDOSCOPY;  Service: Cardiovascular;  Laterality: N/A;  . TONSILLECTOMY    . TOTAL HIP ARTHROPLASTY Left 03/18/2013   Procedure: TOTAL HIP ARTHROPLASTY ANTERIOR APPROACH;  Surgeon: Mauri Pole, MD;  Location: [facility] ORS;  Service: Orthopedics;  Laterality: Left;  . TUBAL LIGATION      Current Outpatient Medications  Medication Sig Dispense Refill  . acetaminophen (TYLENOL) 500 MG tablet Take 500 mg by mouth every 6 (six) hours as needed for moderate pain.    . bisacodyl (DULCOLAX) 5 MG EC tablet Take 5 mg by mouth daily as needed for moderate constipation.    . bisoprolol (ZEBETA) 5 MG tablet Take 2.5 mg by mouth 2 (two) times daily.    . calcium carbonate (TUMS - DOSED IN MG ELEMENTAL CALCIUM) 500 MG chewable tablet Chew 1-2 tablets (200-400 mg of elemental calcium total) by mouth as needed for indigestion or heartburn. 30 tablet 0  . fluticasone (FLONASE) 50 MCG/ACT nasal spray Place 1 spray into both nostrils daily as needed for allergies or rhinitis.    . furosemide (LASIX) 20 MG tablet 40 MG IN MORNING AND 20  MG IN EVENING. TAKE AN EXTRA TABLET DAILY AS NEEDED FOR WEIGHT GAIN OF GREATER THAN 3 POUNDS 90 tablet 1  . losartan (COZAAR) 25 MG tablet Take 0.5 tablets (12.5 mg total) by mouth at bedtime. 45 tablet 3  . naphazoline-pheniramine (NAPHCON-A) 0.025-0.3 % ophthalmic solution Place 1 drop into both eyes daily as needed (dry eyes).     . Rivaroxaban (XARELTO) 15 MG TABS  tablet Take 1 tablet (15 mg total) by mouth daily with supper. RESUME ON 2 JAN 30 tablet 11  . spironolactone (ALDACTONE) 25 MG tablet Take 0.5 tablets (12.5 mg total) by mouth daily. 45 tablet 3  . zolpidem (AMBIEN) 5 MG tablet Take 1 tablet (5 mg total) by mouth at bedtime. 5 tablet 0   No current facility-administered medications for this visit.     Allergies:   Sotalol; Alendronate sodium; Amiodarone; Ciprofloxacin; Coreg [carvedilol]; and Metoprolol   Social History: Social History   Socioeconomic History  . Marital status: Widowed    Spouse name: Not on file  . Number of children: Not on file  . Years of education: Not on file  . Highest education level: Not on file  Occupational History  . Not on file  Social Needs  . Financial resource strain: Not on file  . Food insecurity:    Worry: Not on file    Inability: Not on file  . Transportation needs:    Medical: Not on file    Non-medical: Not on file  Tobacco Use  . Smoking status: Never Smoker  . Smokeless tobacco: Never Used  Substance and Sexual Activity  . Alcohol use: Yes    Alcohol/week: 3.0 standard drinks    Types: 3 Glasses of wine per week    Comment: per week  . Drug use: No  . Sexual activity: Not on file  Lifestyle  . Physical activity:    Days per week: Not on file    Minutes per session: Not on file  . Stress: Not on file  Relationships  . Social connections:    Talks on phone: Not on file    Gets together: Not on file    Attends religious service: Not on file    Active member of club or organization: Not on file    Attends meetings of clubs or organizations: Not on file    Relationship status: Not on file  . Intimate partner violence:    Fear of current or ex partner: Not on file    Emotionally abused: Not on file    Physically abused: Not on file    Forced sexual activity: Not on file  Other Topics Concern  . Not on file  Social History Narrative  . Not on file    Family  History: Family History  Problem Relation Age of Onset  . Lung cancer Mother   . Heart attack Father     Review of Systems: All other systems reviewed and are otherwise negative except as noted above.   Physical Exam: VS:  BP 102/78   Pulse 86   Ht 5\' 5"  (1.651 m)   Wt 141 lb 3.2 oz (64 kg)   SpO2 96%   BMI 23.50 kg/m  , BMI Body mass index is 23.5 kg/m.  GEN- The patient is well appearing, alert and oriented x 3 today.   HEENT: normocephalic, atraumatic; sclera clear, conjunctiva pink; hearing intact; oropharynx clear; neck supple  Lungs- Clear to ausculation bilaterally, normal work of breathing.  No  wheezes, rales, rhonchi Heart- Regular rate and rhythm (paced) GI- soft, non-tender, non-distended, bowel sounds present  Extremities- no clubbing, cyanosis, or edema  MS- no significant deformity or atrophy Skin- warm and dry, no rash or lesion; ICD pocket well healed Psych- euthymic mood, full affect Neuro- strength and sensation are intact  ICD interrogation- reviewed in detail today,  See PACEART report  EKG:  EKG is not ordered today.  Recent Labs: 12/16/2017: B Natriuretic Peptide 971.1 01/22/2018: ALT 14; Magnesium 2.2; TSH 1.371 02/19/2018: Hemoglobin 15.6; Platelets 198 04/24/2018: BUN 27; Creatinine, Ser 1.14; Potassium 3.7; Sodium 138   Wt Readings from Last 3 Encounters:  06/20/18 141 lb 3.2 oz (64 kg)  04/24/18 139 lb (63 kg)  02/19/18 142 lb (64.4 kg)     Other studies Reviewed: Additional studies/ records that were reviewed today include: Dr Rayann Heman and AHF notes  Assessment and Plan:  1.  Chronic systolic dysfunction euvolemic today Stable on an appropriate medical regimen Normal ICD function See Pace Art report No changes today  2.  Complete heart block Normal device function See above  3.  Ventricular tachycardia No recent recurrence Keep K>3.9, Mg >1.8  4.  Permanent AF S/p AVN ablation Continue Xarelto for CHADS2VASC of 4  5.   PVC's She has been intolerant of amiodarone and Mexiletine Ranexa stopped 2/2 nausea Not a candidate for ablation  Effective CRT pacing 94% today    Current medicines are reviewed at length with the patient today.   The patient does not have concerns regarding her medicines.  The following changes were made today:  none  Labs/ tests ordered today include: none Orders Placed This Encounter  Procedures  . CUP PACEART INCLINIC DEVICE CHECK     Disposition:   Follow up with [place], ICM clinic, me in 6 months    Signed, Chanetta Marshall, NP 06/20/2018 11:56 AM  [facility] [address] [place] [address] [phone] (office) [phone] (fax)

## 2018-06-20 ENCOUNTER — Ambulatory Visit (INDEPENDENT_AMBULATORY_CARE_PROVIDER_SITE_OTHER): Payer: PPO | Admitting: Nurse Practitioner

## 2018-06-20 ENCOUNTER — Encounter: Payer: Self-pay | Admitting: Nurse Practitioner

## 2018-06-20 VITALS — BP 102/78 | HR 86 | Ht 65.0 in | Wt 141.2 lb

## 2018-06-20 DIAGNOSIS — I4821 Permanent atrial fibrillation: Secondary | ICD-10-CM | POA: Diagnosis not present

## 2018-06-20 DIAGNOSIS — I472 Ventricular tachycardia, unspecified: Secondary | ICD-10-CM

## 2018-06-20 DIAGNOSIS — I442 Atrioventricular block, complete: Secondary | ICD-10-CM

## 2018-06-20 DIAGNOSIS — I5022 Chronic systolic (congestive) heart failure: Secondary | ICD-10-CM | POA: Diagnosis not present

## 2018-06-20 DIAGNOSIS — I493 Ventricular premature depolarization: Secondary | ICD-10-CM

## 2018-06-20 LAB — CUP PACEART INCLINIC DEVICE CHECK
Date Time Interrogation Session: 20191010111239
Implantable Lead Implant Date: 20181228
Implantable Lead Implant Date: 20181228
Implantable Lead Implant Date: 20181228
Implantable Lead Location: 753858
Implantable Lead Location: 753859
Implantable Lead Location: 753860
Implantable Lead Model: 4598
Implantable Lead Model: 5076
Implantable Pulse Generator Implant Date: 20181228

## 2018-06-20 NOTE — Patient Instructions (Addendum)
Medication Instructions:  Your physician recommends that you continue on your current medications as directed. Please refer to the Current Medication list given to you today.  -- If you need a refill on your cardiac medications before your next appointment, please call your pharmacy. --  Labwork: None ordered  Testing/Procedures: None ordered  Follow-Up: Remote monitoring is used to monitor your ICD from home. This monitoring reduces the number of office visits required to check your device to one time per year. It allows [place] to keep an eye on the functioning of your device to ensure it is working properly. You are scheduled for a device check from home on 09/09/18 [facility]). You may send your transmission at any time that day. If you have a wireless device, the transmission will be sent automatically. After your physician reviews your transmission, you will receive a postcard with your next transmission date.   Your physician wants you to follow-up in: 6 MONTHS with Chanetta Marshall.  You will receive a reminder letter in the mail two months in advance. If you don't receive a letter, please call our office to schedule the follow-up appointment.  Thank you for choosing [facility]!!    Any Other Special Instructions Will Be Listed Below (If Applicable).

## 2018-06-21 ENCOUNTER — Ambulatory Visit (HOSPITAL_COMMUNITY): Payer: PPO

## 2018-06-24 ENCOUNTER — Ambulatory Visit (HOSPITAL_COMMUNITY): Payer: PPO

## 2018-06-26 ENCOUNTER — Ambulatory Visit (HOSPITAL_COMMUNITY): Payer: PPO

## 2018-06-27 DIAGNOSIS — Z23 Encounter for immunization: Secondary | ICD-10-CM | POA: Diagnosis not present

## 2018-06-28 ENCOUNTER — Ambulatory Visit (HOSPITAL_COMMUNITY): Payer: PPO

## 2018-07-01 ENCOUNTER — Ambulatory Visit (HOSPITAL_COMMUNITY): Payer: PPO

## 2018-07-03 ENCOUNTER — Ambulatory Visit (HOSPITAL_COMMUNITY): Payer: PPO

## 2018-07-05 ENCOUNTER — Ambulatory Visit (HOSPITAL_COMMUNITY): Payer: PPO

## 2018-07-08 ENCOUNTER — Ambulatory Visit (HOSPITAL_COMMUNITY): Payer: PPO

## 2018-07-10 ENCOUNTER — Ambulatory Visit (HOSPITAL_COMMUNITY): Payer: PPO

## 2018-07-10 ENCOUNTER — Other Ambulatory Visit (HOSPITAL_COMMUNITY): Payer: Self-pay

## 2018-07-12 ENCOUNTER — Ambulatory Visit (HOSPITAL_COMMUNITY): Payer: PPO

## 2018-07-15 ENCOUNTER — Ambulatory Visit (HOSPITAL_COMMUNITY): Payer: PPO

## 2018-07-17 ENCOUNTER — Ambulatory Visit (HOSPITAL_COMMUNITY): Payer: PPO

## 2018-07-17 ENCOUNTER — Other Ambulatory Visit: Payer: Self-pay

## 2018-07-19 ENCOUNTER — Ambulatory Visit (HOSPITAL_COMMUNITY): Payer: PPO

## 2018-07-22 ENCOUNTER — Ambulatory Visit (HOSPITAL_COMMUNITY): Payer: PPO

## 2018-07-24 ENCOUNTER — Ambulatory Visit (HOSPITAL_COMMUNITY): Payer: PPO

## 2018-07-25 ENCOUNTER — Ambulatory Visit (HOSPITAL_COMMUNITY)
Admission: RE | Admit: 2018-07-25 | Discharge: 2018-07-25 | Disposition: A | Discharge status: Home / Self Care | Payer: PPO | Source: Ambulatory Visit | Attending: Cardiology | Admitting: Cardiology

## 2018-07-25 VITALS — BP 100/64 | HR 71 | Wt 142.4 lb

## 2018-07-25 DIAGNOSIS — Z7901 Long term (current) use of anticoagulants: Secondary | ICD-10-CM | POA: Insufficient documentation

## 2018-07-25 DIAGNOSIS — I4821 Permanent atrial fibrillation: Secondary | ICD-10-CM

## 2018-07-25 DIAGNOSIS — I509 Heart failure, unspecified: Secondary | ICD-10-CM | POA: Diagnosis not present

## 2018-07-25 DIAGNOSIS — Z79899 Other long term (current) drug therapy: Secondary | ICD-10-CM | POA: Diagnosis not present

## 2018-07-25 DIAGNOSIS — I493 Ventricular premature depolarization: Secondary | ICD-10-CM | POA: Diagnosis not present

## 2018-07-25 DIAGNOSIS — I340 Nonrheumatic mitral (valve) insufficiency: Secondary | ICD-10-CM | POA: Insufficient documentation

## 2018-07-25 DIAGNOSIS — Z9889 Other specified postprocedural states: Secondary | ICD-10-CM | POA: Insufficient documentation

## 2018-07-25 DIAGNOSIS — Z95810 Presence of automatic (implantable) cardiac defibrillator: Secondary | ICD-10-CM | POA: Diagnosis not present

## 2018-07-25 DIAGNOSIS — I5022 Chronic systolic (congestive) heart failure: Secondary | ICD-10-CM | POA: Insufficient documentation

## 2018-07-25 DIAGNOSIS — I110 Hypertensive heart disease with heart failure: Secondary | ICD-10-CM | POA: Insufficient documentation

## 2018-07-25 DIAGNOSIS — R0602 Shortness of breath: Secondary | ICD-10-CM | POA: Diagnosis present

## 2018-07-25 DIAGNOSIS — I428 Other cardiomyopathies: Secondary | ICD-10-CM | POA: Diagnosis not present

## 2018-07-25 DIAGNOSIS — I472 Ventricular tachycardia: Secondary | ICD-10-CM | POA: Insufficient documentation

## 2018-07-25 DIAGNOSIS — E785 Hyperlipidemia, unspecified: Secondary | ICD-10-CM | POA: Insufficient documentation

## 2018-07-25 DIAGNOSIS — I482 Chronic atrial fibrillation, unspecified: Secondary | ICD-10-CM | POA: Diagnosis not present

## 2018-07-25 LAB — BASIC METABOLIC PANEL
Anion gap: 10 (ref 5–15)
BUN: 25 mg/dL — ABNORMAL HIGH (ref 8–23)
CO2: 25 mmol/L (ref 22–32)
Calcium: 9 mg/dL (ref 8.9–10.3)
Chloride: 101 mmol/L (ref 98–111)
Creatinine, Ser: 1.07 mg/dL — ABNORMAL HIGH (ref 0.44–1.00)
GFR calc Af Amer: 55 mL/min — ABNORMAL LOW (ref >60)
GFR calc non Af Amer: 47 mL/min — ABNORMAL LOW (ref >60)
Glucose, Bld: 109 mg/dL — ABNORMAL HIGH (ref 70–99)
Potassium: 3.4 mmol/L — ABNORMAL LOW (ref 3.5–5.1)
Sodium: 136 mmol/L (ref 135–145)

## 2018-07-25 LAB — CBC
HCT: 44.5 % (ref 36.0–46.0)
Hemoglobin: 15.3 g/dL — ABNORMAL HIGH (ref 12.0–15.0)
MCH: 32.1 pg (ref 26.0–34.0)
MCHC: 34.4 g/dL (ref 30.0–36.0)
MCV: 93.5 fL (ref 80.0–100.0)
Platelets: 185 10*3/uL (ref 150–400)
RBC: 4.76 MIL/uL (ref 3.87–5.11)
RDW: 11.8 % (ref 11.5–15.5)
WBC: 7.2 10*3/uL (ref 4.0–10.5)
nRBC: 0 % (ref 0.0–0.2)

## 2018-07-25 LAB — TSH: TSH: 3.403 u[IU]/mL (ref 0.350–4.500)

## 2018-07-25 NOTE — Patient Instructions (Signed)
Labs today We will only contact you if something comes back abnormal or we need to make some changes. Otherwise no news is good news!  Your physician recommends that you schedule a follow-up appointment in: 4 months

## 2018-07-25 NOTE — Progress Notes (Signed)
Advanced Heart Failure Clinic Note   Primary Care: Dr Laurann Montana HF Cardiology: Dr. Aundra Dubin  HPI: 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 Cardiomyopathy dates back to at least 2014 based on echoes (EF 30-35% in 2014).   Admitted 8/17 for fatigue and dyspnea.  She was seen by electrophysiology on 04/17/16, and was started on po Amiodarone as she reported increased palpitations and fatigue with frequent PVCs. Her BNP was elevated at 1592.6 and chest x ray was consistent with CHF.  She was diuresed.  With her complaints of new onset fatigue and dyspnea combined with her known LV dysfunction, it was felt that she would benefit from an ischemic evaluation by cath. She underwent left heart cath with normal cors. Amiodarone was later stopped due to nausea.  Discharge weight 143 pounds. Echo (8/17) with EF 40-45%, inferoseptal akinesis. Cardiac MRI 10/17 with EF 36%, LGE mid wall pattern in the basal to mid septum and inferior wall. Holter monitor in 9/17 with runs of atrial fibrillation/RVR.  She has had trouble tolerating cardiac meds due to hypotension/lightheadedness.    She has had difficulty with symptomatic atrial fibrillation. She developed symptomatic atrial fibrillation with RVR again in 10/18, difficult to control rate.  She had TEE-guided DCCV with resumption of NSR. On TEE in rapid atrial fibrillation, EF 20-25%.  On TTE after DCCV, EF back to 40-45% range.  MR reported as moderate to severe but I reviewed echo and think it appears more in the moderate range.    She had recurrent atrial fibrillation after 10/18 DCCV. She was admitted in 12/18 with atrial fibrillation and RVR, she also had a run of WCT thought to be VT.   The atrial fibrillation was very difficult to control.  She was thought to be a poor candidate for atrial fibrillation ablation.  She finally had AV nodal  ablation with [employer] CRT-D device in 12/18. She was then admitted briefly with CHF in 1/19.  Echo in 12/18 showed EF 25-30%, moderate MR, moderate TR, severe LAE.  Echo in 4/19 showed EF 25-30%, diffuse hypokinesis, mild MR, moderately decreased RV systolic function.   Admitted 4/7 - 12/21/17 with A/C CHF. Found to have decreased BiV pacing in setting of frequent PVCs. Tried on Mexiletine but failed due to nausea. Switched to Ranexa. Diuresed with IV lasix.   In 5/19, she was admitted with a near-syncopal episode that occurred while straining for a bowel movement.  Suspected vagal event.   She has stopped ranolazine due to nausea.  She did not tolerate increase in bisoprolol after last appointment.   She returns for followup of atrial fibrillation and CHF today.  She is doing reasonably well.  Feels good overall.  She is short of breath carrying groceries but no problems walking without carrying a load.  She tries to walk 10 minutes or so briskly a day.  No orthopnea/PND.  No chest pain. No lightheadedness.    ECG (personally reviewed): atrial fibrillation, BiV paced  Labs (8/17): K 4.2, creatinine 0.85, BNP 1593, HCT 43.2, TSH mild elevated 5.3, free T4 normal Labs (9/17): K 4.1, creatinine 0.98 => 0.92, BNP 258, SPEP negative Labs (2/18): K 4, creatinine 0.83, HCT 40.8 Labs (10/18): K 4.2, creatinine 0.94, hgb 14.9 Labs (1/19): K 4.9, creatinine 0.72 Labs (2/19): K 4.1, creatinine 0.97 Labs (5/19): K 3.4, creatinine 1.17 Labs (6/19): K 3.9, creatinine 1.2 Labs (  8/19): K 3.7, creatinine 1.14  1. Chronic systolic CHF:  Nonischemic cardiomyopathy.   - Echo (2014): EF 30-35%. - Echo (2/17): EF 40-45% - Echo (8/17): EF 40-45%, mid to apical inferoseptal akinesis - Coronary angiography (8/17): No significant CAD.  - Cardiac MRI (10/17): EF 36%, moderate MR, normal RV size and systolic function, mid-wall LGE in the basal to mid septum and inferior wall.  - TEE (10/18): EF 20-25% but in  rapid afib with HR up to 150 bpm, mild MR.  - Echo (10/18): EF 40-45%, diffuse hypokinesis, moderate LAE, reportedly moderate-severe MR (looks moderate on my review).  - Echo (12/18): EF 25-30%, moderate MR, moderate TR, severe LAE - Medtronic CRT-D device s/p AV nodal ablation.  - Echo (4/19): EF 25-30%, diffuse hypokinesis, mild MR, moderately decreased RV systolic function.  2. Atrial fibrillation: Paroxysmal.  Diagnosed 2/17.  Unable to tolerate amiodarone.  - Holter (9/17): atrial fibrillation with RVR runs, 5% PVCs.  - AV nodal ablation with BiV pacing in 12/18.  - Nausea with ranolazine.  3. Hyperlipidemia 4. PVCs: frequent.  5. Long QT interval  Review of systems complete and found to be negative unless listed in HPI.    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 Occasional ETOH, never heavy.    Current Outpatient Medications  Medication Sig Dispense Refill  . acetaminophen (TYLENOL) 500 MG tablet Take 500 mg by mouth every 6 (six) hours as needed for moderate pain.    . bisacodyl (DULCOLAX) 5 MG EC tablet Take 5 mg by mouth daily as needed for moderate constipation.    . bisoprolol (ZEBETA) 5 MG tablet Take 2.5 mg by mouth 2 (two) times daily.    . calcium carbonate (TUMS - DOSED IN MG ELEMENTAL CALCIUM) 500 MG chewable tablet Chew 1-2 tablets (200-400 mg of elemental calcium total) by mouth as needed for indigestion or heartburn. 30 tablet 0  . fluticasone (FLONASE) 50 MCG/ACT nasal spray Place 1 spray into both nostrils daily as needed for allergies or rhinitis.    . furosemide (LASIX) 20 MG tablet 40 MG IN MORNING AND 20 MG IN EVENING. TAKE AN EXTRA TABLET DAILY AS NEEDED FOR WEIGHT GAIN OF GREATER THAN 3 POUNDS (Patient taking differently: 40 MG IN MORNING AND 20 MG IN EVENING. TAKE AN EXTRA TABLET DAILY AS NEEDED FOR WEIGHT GAIN OF GREATER THAN 3 POUNDS) 90 tablet 1  . losartan (COZAAR) 25 MG tablet Take 0.5  tablets (12.5 mg total) by mouth at bedtime. 45 tablet 3  . naphazoline-pheniramine (NAPHCON-A) 0.025-0.3 % ophthalmic solution Place 1 drop into both eyes daily as needed (dry eyes).     . Rivaroxaban (XARELTO) 15 MG TABS tablet Take 1 tablet (15 mg total) by mouth daily with supper. RESUME ON 2 JAN 30 tablet 11  . spironolactone (ALDACTONE) 25 MG tablet Take 0.5 tablets (12.5 mg total) by mouth daily. 45 tablet 3  . zolpidem (AMBIEN) 5 MG tablet Take 1 tablet (5 mg total) by mouth at bedtime. 5 tablet 0   No current facility-administered medications for this encounter.     Vitals:   07/25/18 1120  BP: 100/64  Pulse: 71  SpO2: 96%  Weight: 64.6 kg (142 lb 6.4 oz)   Wt Readings from Last 3 Encounters:  07/25/18 64.6 kg (142 lb 6.4 oz)  06/20/18 64 kg (141 lb 3.2 oz)  04/24/18 63 kg (139 lb)    PHYSICAL EXAM:  General: NAD Neck: No JVD, no thyromegaly or thyroid nodule.  Lungs: Clear to auscultation bilaterally with normal respiratory effort. CV: Nondisplaced PMI.  Heart regular S1/S2, no S3/S4, no murmur.  No peripheral edema.  No carotid bruit.  Normal pedal pulses.  Abdomen: Soft, nontender, no hepatosplenomegaly, no distention.  Skin: Intact without lesions or rashes.  Neurologic: Alert and oriented x 3.  Psych: Normal affect. Extremities: No clubbing or cyanosis.  HEENT: Normal.   ASSESSMENT & PLAN:  1. Chronic systolic HF: NICM, LHC 01/4649 showed no significant coronary disease. Cardiac MRI 10/17 EF 36%, diffuse HK, normal RV, biatrial enlargement, moderate MR, non-coronary LGE pattern involving the mid-wall of the basal to mid septum and inferior wall. Possibly prior myocarditis versus a form of infiltrative disease. Echo 12/17/17: EF 25-30%, severe diffuse HK, mild AI, mild MR, mod systolic dysfunction RV, LA dilation, RA dilation, moderate TR.  [employer] CRT-D s/p AV nodal ablation.  NYHA class II-III symptoms, stable to improved.  She does not appear volume overloaded on  exam. .    - Continue Lasix 20 mg bid. BMET today.  -Continue bisoprolol 2.5 mg bid.  Unable to tolerate increase.   -Continuelosartan 12.5 mg qHS. Unable to tolerate increase.  - Continue spironolactone 12.5 daily.  Unable to tolerate increase.  2. Atrial fibrillation: Chronic, s/p AV nodal ablation and BiV pacing.  - Continue Xarelto 15 mg daily. CBC today.  3. PVCs: Frequent PVCs have limited BiV pacing.  Not a good PVC ablation candidate per EP. Cannot tolerate amiodarone, sotolol, or Tikosyn. Did not tolerate mexilitine with nausea. Most recently, stopped ranolazine due to nausea. Additionally, did not tolerate further uptitration of bisoprolol.  - I am not sure we have a lot of further options here to suppress her PVCs.  4. Mitral regurgitation: Mild on 4/19 echo. 5. H/o pulseless VT: Has ICD.   Followup in 4 months.   [name], MD  07/25/2018

## 2018-07-26 ENCOUNTER — Ambulatory Visit (HOSPITAL_COMMUNITY): Payer: PPO

## 2018-07-26 ENCOUNTER — Telehealth (HOSPITAL_COMMUNITY): Payer: Self-pay

## 2018-07-26 NOTE — Telephone Encounter (Signed)
Pt called with lab results verbalized understanding

## 2018-07-29 ENCOUNTER — Other Ambulatory Visit (HOSPITAL_COMMUNITY): Payer: Self-pay | Admitting: Cardiology

## 2018-07-29 ENCOUNTER — Ambulatory Visit (HOSPITAL_COMMUNITY): Payer: PPO

## 2018-07-31 ENCOUNTER — Ambulatory Visit (HOSPITAL_COMMUNITY): Payer: PPO

## 2018-08-02 ENCOUNTER — Ambulatory Visit (HOSPITAL_COMMUNITY): Payer: PPO

## 2018-08-26 ENCOUNTER — Other Ambulatory Visit: Payer: Self-pay

## 2018-08-26 ENCOUNTER — Encounter (HOSPITAL_COMMUNITY): Payer: Self-pay

## 2018-08-26 ENCOUNTER — Emergency Department (HOSPITAL_COMMUNITY)
Admission: EM | Admit: 2018-08-26 | Discharge: 2018-08-26 | Disposition: A | Discharge status: Home / Self Care | Payer: PPO | Attending: Emergency Medicine | Admitting: Emergency Medicine

## 2018-08-26 ENCOUNTER — Emergency Department (HOSPITAL_COMMUNITY): Payer: PPO

## 2018-08-26 DIAGNOSIS — R0602 Shortness of breath: Secondary | ICD-10-CM | POA: Diagnosis not present

## 2018-08-26 DIAGNOSIS — Z79899 Other long term (current) drug therapy: Secondary | ICD-10-CM | POA: Insufficient documentation

## 2018-08-26 DIAGNOSIS — I5042 Chronic combined systolic (congestive) and diastolic (congestive) heart failure: Secondary | ICD-10-CM | POA: Insufficient documentation

## 2018-08-26 DIAGNOSIS — E876 Hypokalemia: Secondary | ICD-10-CM | POA: Diagnosis not present

## 2018-08-26 DIAGNOSIS — E860 Dehydration: Secondary | ICD-10-CM | POA: Insufficient documentation

## 2018-08-26 DIAGNOSIS — N183 Chronic kidney disease, stage 3 (moderate): Secondary | ICD-10-CM | POA: Diagnosis not present

## 2018-08-26 DIAGNOSIS — R55 Syncope and collapse: Secondary | ICD-10-CM | POA: Diagnosis not present

## 2018-08-26 DIAGNOSIS — I130 Hypertensive heart and chronic kidney disease with heart failure and stage 1 through stage 4 chronic kidney disease, or unspecified chronic kidney disease: Secondary | ICD-10-CM | POA: Diagnosis not present

## 2018-08-26 LAB — COMPREHENSIVE METABOLIC PANEL
ALT: 15 U/L (ref 0–44)
AST: 24 U/L (ref 15–41)
Albumin: 3.2 g/dL — ABNORMAL LOW (ref 3.5–5.0)
Alkaline Phosphatase: 60 U/L (ref 38–126)
Anion gap: 10 (ref 5–15)
BUN: 22 mg/dL (ref 8–23)
CO2: 22 mmol/L (ref 22–32)
Calcium: 8 mg/dL — ABNORMAL LOW (ref 8.9–10.3)
Chloride: 104 mmol/L (ref 98–111)
Creatinine, Ser: 1.39 mg/dL — ABNORMAL HIGH (ref 0.44–1.00)
GFR calc Af Amer: 41 mL/min — ABNORMAL LOW (ref >60)
GFR calc non Af Amer: 35 mL/min — ABNORMAL LOW (ref >60)
Glucose, Bld: 136 mg/dL — ABNORMAL HIGH (ref 70–99)
Potassium: 2.8 mmol/L — ABNORMAL LOW (ref 3.5–5.1)
Sodium: 136 mmol/L (ref 135–145)
Total Bilirubin: 0.8 mg/dL (ref 0.3–1.2)
Total Protein: 6.3 g/dL — ABNORMAL LOW (ref 6.5–8.1)

## 2018-08-26 LAB — I-STAT TROPONIN, ED: Troponin i, poc: 0.03 ng/mL (ref 0.00–0.08)

## 2018-08-26 LAB — CBC WITH DIFFERENTIAL/PLATELET
Abs Immature Granulocytes: 0.02 10*3/uL (ref 0.00–0.07)
Basophils Absolute: 0 10*3/uL (ref 0.0–0.1)
Basophils Relative: 1 %
Eosinophils Absolute: 0 10*3/uL (ref 0.0–0.5)
Eosinophils Relative: 1 %
HCT: 37.9 % (ref 36.0–46.0)
Hemoglobin: 12.8 g/dL (ref 12.0–15.0)
Immature Granulocytes: 0 %
Lymphocytes Relative: 11 %
Lymphs Abs: 0.7 10*3/uL (ref 0.7–4.0)
MCH: 31.7 pg (ref 26.0–34.0)
MCHC: 33.8 g/dL (ref 30.0–36.0)
MCV: 93.8 fL (ref 80.0–100.0)
Monocytes Absolute: 0.6 10*3/uL (ref 0.1–1.0)
Monocytes Relative: 9 %
Neutro Abs: 5.2 10*3/uL (ref 1.7–7.7)
Neutrophils Relative %: 78 %
Platelets: 162 10*3/uL (ref 150–400)
RBC: 4.04 MIL/uL (ref 3.87–5.11)
RDW: 11.8 % (ref 11.5–15.5)
WBC: 6.5 10*3/uL (ref 4.0–10.5)
nRBC: 0 % (ref 0.0–0.2)

## 2018-08-26 LAB — BRAIN NATRIURETIC PEPTIDE: B Natriuretic Peptide: 603.8 pg/mL — ABNORMAL HIGH (ref 0.0–100.0)

## 2018-08-26 MED ORDER — POTASSIUM CHLORIDE 10 MEQ/100ML IV SOLN
10.0000 meq | Freq: Once | INTRAVENOUS | Status: AC
  Administered 2018-08-26: 10 meq via INTRAVENOUS
  Filled 2018-08-26: qty 100

## 2018-08-26 MED ORDER — SODIUM CHLORIDE 0.9 % IV BOLUS
500.0000 mL | Freq: Once | INTRAVENOUS | Status: AC
  Administered 2018-08-26: 500 mL via INTRAVENOUS

## 2018-08-26 MED ORDER — ONDANSETRON HCL 4 MG/2ML IJ SOLN
4.0000 mg | Freq: Once | INTRAMUSCULAR | Status: AC
  Administered 2018-08-26: 4 mg via INTRAVENOUS
  Filled 2018-08-26: qty 2

## 2018-08-26 NOTE — ED Provider Notes (Signed)
[facility] EMERGENCY DEPARTMENT Provider Note   CSN: [id number] Arrival date & time: 08/26/18  1456     History   Chief Complaint Chief Complaint  Patient presents with  . Near Syncope    HPI Cheryl Vargas is a 83 y.o. female.  Patient is an 83 year old female with a significant cardiac history of CHF due to nonischemic cardiomyopathy last EF of 25 to 30% who has had multiple episodes of difficult to control A. fib RVR resulting in ablations, medication management and finally ICD placement.  Patient states she has issues with being hypotensive in the mornings which is been going on for months and also as documented by cardiology.  Over the weekend her blood pressure was 70s over 50s in the morning and so she decided to stand but had no symptoms.  She does state yesterday she had 4 or more loose stools ate very little and was not drinking as much as she should.  She has felt slightly more short of breath on exertion recently but cannot give a timeframe.  She denies any weight gain or weight loss.  She is taking less blood pressure medicine than prescribed due to her hypotensive episodes but is still taking her diuretic.  She states she had the low blood pressure this morning and had held her losartan last night and her blood pressure medicine this morning but was feeling okay so decided to go out shopping.  While shopping she had an event which she describes as near syncope.  She said that she felt very lightheaded, sweaty, nauseated.  She had to lean up against the glass because she was afraid she was going to go down.  She states this episode lasted for 3 or 4 minutes and someone helped her to the ground.  She then started feeling better and someone helped her out to her car.  She states while driving home she felt okay but when she got home she was unable to get out of the car.  She then had 1 more episode that was similar in nature.  She never lost consciousness she never  had back pain, chest discomfort or abdominal discomfort.  However she states the same thing happened when she had a cardiac arrest over a year ago.  She had a catheterization around that time that showed normal coronary arteries but did have some wall motion abnormality.  Dysrhythmia was thought to be related to her cardiomyopathy.  Today she did hear her ICD beeping once this morning when she was getting ready to go out but denied any shock.  She did not feel any palpitations during this event and the ICD did not make any noises.  The history is provided by the patient and medical records.  Near Syncope  This is a new problem. The current episode started 3 to 5 hours ago. Episode frequency: 3 episodes in the last 2 hours. The problem has been rapidly improving. Pertinent negatives include no chest pain, no abdominal pain and no headaches. Associated symptoms comments: No cough, fever, abdominal pain.  Patient states when these episodes occur she gets very sweaty, clammy, no energy to the point she has to sit down or she feels she may pass out.  Also she gets severe nausea with these events.. The symptoms are aggravated by exertion. The symptoms are relieved by rest. The treatment provided no relief.    Past Medical History:  Diagnosis Date  . Arthritis   . Chronic combined systolic (  congestive) and diastolic (congestive) heart failure ([place])   . Mitral regurgitation   . Non-ischemic cardiomyopathy ([place])   . Persistent atrial fibrillation   . Ventricular tachycardia [facility])     Patient Active Problem List   Diagnosis Date Noted  . Hypercholesterolemia without hypertriglyceridemia 02/13/2018  . Syncope 01/21/2018  . Constipation 01/21/2018  . CHF exacerbation ([place]) 12/18/2017  . CKD (chronic kidney disease), stage III ([place]) 12/16/2017  . SOB (shortness of breath)   . Abdominal pain 09/18/2017  . CHF (congestive heart failure) ([place]) 09/17/2017  . Pulmonary edema   . Cardiac device in situ   .  Ventricular tachycardia ([place])   . Cardiac arrest ([place])   . Systolic and diastolic CHF, acute ([place])   . Severe mitral valve regurgitation   . A-fib ([place]) 09/03/2017  . Mitral regurgitation   . Acute on chronic systolic heart failure ([place])   . Non-ischemic cardiomyopathy ([place])   . CHF (congestive heart failure), NYHA class IV ([place]) 04/24/2016  . Dizziness 12/01/2015  . Hypertension 12/01/2015  . Insomnia 12/01/2015  . Paroxysmal atrial fibrillation ([facility])   . Cardiac arrhythmia 10/20/2014  . Systolic dysfunction 78/46/9629  . Hypoventilation, idiopathic 10/20/2014  . Other fatigue 10/20/2014  . Closed left hip fracture ([place]) 03/17/2013  . Cardiomegaly 03/17/2013  . Frequent PVCs 03/17/2013  . Hyperlipidemia 03/17/2013    Past Surgical History:  Procedure Laterality Date  . AV NODE ABLATION N/A 09/07/2017   Procedure: AV NODE ABLATION;  Surgeon: [name], MD;  Location: [facility] CV LAB;  Service: Cardiovascular;  Laterality: N/A;  . BIV ICD INSERTION CRT-D N/A 09/07/2017   Procedure: BIV ICD INSERTION CRT-D;  Surgeon: [name], MD;  Location: [facility] CV LAB;  Service: Cardiovascular;  Laterality: N/A;  . BUNIONECTOMY    . CARDIAC CATHETERIZATION     in 2004 at [facility]. "Insignificant blockage" per patient  . CARDIAC CATHETERIZATION N/A 04/25/2016   Procedure: Left Heart Cath and Coronary Angiography;  Surgeon: Nelva Bush, MD;  Location: [facility] CV LAB;  Service: Cardiovascular;  Laterality: N/A;  . CARDIOVERSION N/A 06/29/2017   Procedure: CARDIOVERSION;  Surgeon: Larey Dresser, MD;  Location: [facility] ENDOSCOPY;  Service: Cardiovascular;  Laterality: N/A;  . CARDIOVERSION N/A 08/28/2017   Procedure: CARDIOVERSION;  Surgeon: Larey Dresser, MD;  Location: [facility] ENDOSCOPY;  Service: Cardiovascular;  Laterality: N/A;  . SHOULDER SURGERY     closed reduction  . TEE WITHOUT CARDIOVERSION N/A 06/29/2017   Procedure: TRANSESOPHAGEAL ECHOCARDIOGRAM (TEE);   Surgeon: Larey Dresser, MD;  Location: [facility] END[facility]COPY;  Service: Cardiovascular;  Laterality: N/A;  . TONSILLECTOMY    . TOTAL HIP ARTHROPLASTY Left 03/18/2013   Procedure: TOTAL HIP ARTHROPLASTY ANTERIOR APPROACH;  Surgeon: Mauri Pole, MD;  Location: [facility] ORS;  Service: Orthopedics;  Laterality: Left;  . TUBAL LIGATION       OB History   No obstetric history on file.      Home Medications    Prior to Admission medications   Medication Sig Start Date End Date Taking? Authorizing Provider  acetaminophen (TYLENOL) 500 MG tablet Take 500 mg by mouth every 6 (six) hours as needed for moderate pain.    [provider]  bisacodyl (DULCOLAX) 5 MG EC tablet Take 5 mg by mouth daily as needed for moderate constipation.    [provider]  bisoprolol (ZEBETA) 5 MG tablet Take 2.5 mg by mouth 2 (two) times daily.    [provider]  calcium carbonate (  TUMS - DOSED IN MG ELEMENTAL CALCIUM) 500 MG chewable tablet Chew 1-2 tablets (200-400 mg of elemental calcium total) by mouth as needed for indigestion or heartburn. 09/09/17   Allie Bossier, MD  fluticasone [facility]) 50 MCG/ACT nasal spray Place 1 spray into both nostrils daily as needed for allergies or rhinitis.    [provider]  furosemide (LASIX) 20 MG tablet SEE NOTES 07/29/18   Larey Dresser, MD  losartan (COZAAR) 25 MG tablet Take 0.5 tablets (12.5 mg total) by mouth at bedtime. 04/18/18   Larey Dresser, MD  naphazoline-pheniramine (NAPHCON-A) 0.025-0.3 % ophthalmic solution Place 1 drop into both eyes daily as needed (dry eyes).     [provider]  Rivaroxaban (XARELTO) 15 MG TABS tablet Take 1 tablet (15 mg total) by mouth daily with supper. RESUME ON 2 JAN 09/09/17   Allie Bossier, MD  spironolactone (ALDACTONE) 25 MG tablet Take 0.5 tablets (12.5 mg total) by mouth daily. 02/20/18   Larey Dresser, MD  zolpidem (AMBIEN) 5 MG tablet Take 1 tablet (5 mg total) by mouth at bedtime.  09/20/17   Mariel Aloe, MD    Family History Family History  Problem Relation Age of Onset  . Lung cancer Mother   . Heart attack Father     Social History Social History   Tobacco Use  . Smoking status: Never Smoker  . Smokeless tobacco: Never Used  Substance Use Topics  . Alcohol use: Yes    Alcohol/week: 3.0 standard drinks    Types: 3 Glasses of wine per week    Comment: per week  . Drug use: No     Allergies   Sotalol; Alendronate sodium; Amiodarone; Ciprofloxacin; Coreg [carvedilol]; and Metoprolol   Review of Systems Review of Systems  Cardiovascular: Positive for near-syncope. Negative for chest pain.  Gastrointestinal: Negative for abdominal pain.  Neurological: Negative for headaches.  All other systems reviewed and are negative.    Physical Exam Updated Vital Signs BP 135/83   Pulse 98   Resp 18   Ht 5\' 5"  (1.651 m)   Wt 64 kg   SpO2 99%   BMI 23.46 kg/m   Physical Exam Vitals signs and nursing note reviewed.  Constitutional:      General: She is not in acute distress.    Appearance: Normal appearance. She is well-developed.  HENT:     Head: Normocephalic and atraumatic.  Eyes:     Pupils: Pupils are equal, round, and reactive to light.  Cardiovascular:     Rate and Rhythm: Tachycardia present. Rhythm irregular. Frequent extrasystoles are present.    Pulses: Normal pulses.     Heart sounds: Normal heart sounds. No murmur. No friction rub.  Pulmonary:     Effort: Pulmonary effort is normal.     Breath sounds: Normal breath sounds. No wheezing or rales.  Abdominal:     General: Bowel sounds are normal. There is no distension.     Palpations: Abdomen is soft.     Tenderness: There is no abdominal tenderness. There is no guarding or rebound.  Musculoskeletal: Normal range of motion.        General: No tenderness.     Comments: No edema  Skin:    General: Skin is warm and dry.     Findings: No rash.  Neurological:     Mental Status:  She is alert and oriented to person, place, and time.     Cranial Nerves: No  cranial nerve deficit.  Psychiatric:        Behavior: Behavior normal.      ED Treatments / Results  Labs (all labs ordered are listed, but only abnormal results are displayed) Labs Reviewed  COMPREHENSIVE METABOLIC PANEL - Abnormal; Notable for the following components:      Result Value   Potassium 2.8 (*)    Glucose, Bld 136 (*)    Creatinine, Ser 1.39 (*)    Calcium 8.0 (*)    Total Protein 6.3 (*)    Albumin 3.2 (*)    GFR calc non Af Amer 35 (*)    GFR calc Af Amer 41 (*)    All other components within normal limits  BRAIN NATRIURETIC PEPTIDE - Abnormal; Notable for the following components:   B Natriuretic Peptide 603.8 (*)    All other components within normal limits  CBC WITH DIFFERENTIAL/PLATELET  I-STAT TROPONIN, ED    EKG EKG Interpretation  Date/Time:  Monday August 26 2018 15:10:53 EST Ventricular Rate:  99 PR Interval:    QRS Duration: 156 QT Interval:  394 QTC Calculation: 506 R Axis:   164 Text Interpretation:  Atrial fibrillation Nonspecific intraventricular conduction delay Lateral infarct, age indeterminate Premature ventricular complexes Confirmed by Blanchie Dessert [phone]) on 08/26/2018 6:24:08 PM   Radiology Dg Chest 2 View  Result Date: 08/26/2018 CLINICAL DATA:  Shortness of breath. EXAM: CHEST - 2 VIEW COMPARISON:  Radiograph of Jan 21, 2018. FINDINGS: Stable cardiomediastinal silhouette. Atherosclerosis of thoracic aorta is noted. Left-sided pacemaker is unchanged in position. No pneumothorax or pleural effusion is noted. Bony thorax is unremarkable. No acute pulmonary disease is noted. IMPRESSION: No active cardiopulmonary disease. Aortic Atherosclerosis (ICD10-I70.0). Electronically Signed   By: Marijo Conception, M.D.   On: 08/26/2018 16:19    Procedures Procedures (including critical care time)  Medications Ordered in ED Medications  potassium chloride  10 mEq in 100 mL IVPB (10 mEq Intravenous New Bag/Given 08/26/18 1728)  sodium chloride 0.9 % bolus 500 mL (0 mLs Intravenous Stopped 08/26/18 1720)  ondansetron (ZOFRAN) injection 4 mg (4 mg Intravenous Given 08/26/18 1547)     Initial Impression / Assessment and Plan / ED Course  I have reviewed the triage vital signs and the nursing notes.  Pertinent labs & imaging results that were available during my care of the patient were reviewed by me and considered in my medical decision making (see chart for details).     Patient is an elderly female presenting today with multiple episodes of near syncope usually related to exertion that started today.  Patient has a long history of cardiac issues including a cardiomyopathy with an EF of 25 to 30%.  She denies any chest pain and on exam is in no acute distress and able to give all of her history.  She has been decreasing the amount of blood pressure medication she has been taking due to ongoing chronic hypotension in the mornings.  Patient is not hypotensive here and has a blood pressure between 1 30-1 40s over 70s.  She has no evidence of fluid overload but does complain that she has had slightly more shortness of breath with exertion than normal and that she is tired all the time.  She does report that she had diarrhea yesterday and ate and drank very little.  Concerned that the near syncopal events could be related to dehydration and hypotension, concern for acute kidney injury or electrolyte abnormality versus dysrhythmia.  ICD will be  interrogated.  EKG, CBC, CMP, BNP, chest x-ray, troponin pending.  Patient is currently denying any infectious symptoms such as fever, cough.  Low suspicion for PE or acute abdominal pathology as patient is on Xarelto so would be less likely to have clot and has no abdominal pain at this time and has had no further loose stool.  Pt given gentle bolus of fluid and zofran.  Patient's labs today show normal troponin, normal  hemoglobin and white count.  CMP with mild evidence of AKI with creatinine of 1.39 from 1.07 a month ago and hypokalemia of 2.8 which is most likely related to medications and diarrhea yesterday.  BNP today is much improved at 600 and x-ray is clear.  Findings discussed with the patient.  Spoke with [employer] and they state patient had a single episode of V. tach today which was 10 beats that lasted approximately 1 to 2 seconds but no other acute events.  She is having ectopy and pacemaker is pacing her at a higher heart rate around 100 when her baseline is in the 80s.  Feel patient's symptoms are most likely volume related.  She was given IV potassium and a 500 mL bolus.  Heart rate improved to the 80s.  Patient reported feeling better.  Once fluids are done we will ambulate the patient to ensure her symptoms do not return.  Pt was able to ambulate and denies any shortness of breath, near syncope, dizziness or chest pain.  Will d/c home with close f/u with cards.  Pt is with her son currently and they are comfortable with this plan.   Before changing her meds she will discuss with Dr. Aundra Dubin  Final Clinical Impressions(s) / ED Diagnoses   Final diagnoses:  Near syncope  Dehydration  Hypokalemia    ED Discharge Orders    None       Blanchie Dessert, MD 08/27/18 0000

## 2018-08-26 NOTE — ED Notes (Signed)
Pt ambulated to bathroom 

## 2018-08-26 NOTE — ED Notes (Signed)
Pt ambulated in halls per MD order.  Patient tolerated well, no dizziness noted, steady gait and pace, no rest breaks needed.  HR maintained 80's with occasional PVCs, O2 sats 100 on RA. Pt states she feels better than when she came in and is ready to go home. Dr. Maryan Rued made aware.

## 2018-08-26 NOTE — ED Triage Notes (Signed)
Pt states was running errands and suddenly felt like she would faint while walking back to her car. States her BP has been running low and she held her morning dose of BP meds. Hx CHF, medtronic ICD Denies cp, fever. Does state gets SOB with min exertion which is new for her.

## 2018-08-26 NOTE — Discharge Instructions (Signed)
Please return to the emergency room if these symptoms start happening again, you develop chest pain or shortness of breath.

## 2018-08-27 ENCOUNTER — Telehealth (HOSPITAL_COMMUNITY): Payer: Self-pay | Admitting: Surgery

## 2018-08-27 NOTE — Telephone Encounter (Signed)
I spoke to Ms. Rosemeyer -she is agreeable to appt. scheduled Jan. 8th at 0930 in [facility].

## 2018-08-27 NOTE — Telephone Encounter (Signed)
I called to reach Cheryl Vargas to arrange a follow-up appt.in the [facility].  I left a message for her to call back to schedule.

## 2018-08-29 DIAGNOSIS — I480 Paroxysmal atrial fibrillation: Secondary | ICD-10-CM | POA: Diagnosis not present

## 2018-08-29 DIAGNOSIS — M19079 Primary osteoarthritis, unspecified ankle and foot: Secondary | ICD-10-CM | POA: Diagnosis not present

## 2018-08-29 DIAGNOSIS — I5042 Chronic combined systolic (congestive) and diastolic (congestive) heart failure: Secondary | ICD-10-CM | POA: Diagnosis not present

## 2018-08-30 ENCOUNTER — Ambulatory Visit (HOSPITAL_COMMUNITY)
Admission: RE | Admit: 2018-08-30 | Discharge: 2018-08-30 | Disposition: A | Discharge status: Home / Self Care | Payer: PPO | Source: Ambulatory Visit | Attending: Internal Medicine | Admitting: Internal Medicine

## 2018-08-30 DIAGNOSIS — I509 Heart failure, unspecified: Secondary | ICD-10-CM | POA: Diagnosis not present

## 2018-08-30 LAB — BASIC METABOLIC PANEL
Anion gap: 12 (ref 5–15)
BUN: 19 mg/dL (ref 8–23)
CO2: 27 mmol/L (ref 22–32)
Calcium: 8.8 mg/dL — ABNORMAL LOW (ref 8.9–10.3)
Chloride: 103 mmol/L (ref 98–111)
Creatinine, Ser: 1.19 mg/dL — ABNORMAL HIGH (ref 0.44–1.00)
GFR calc Af Amer: 50 mL/min — ABNORMAL LOW (ref >60)
GFR calc non Af Amer: 43 mL/min — ABNORMAL LOW (ref >60)
Glucose, Bld: 94 mg/dL (ref 70–99)
Potassium: 3.6 mmol/L (ref 3.5–5.1)
Sodium: 142 mmol/L (ref 135–145)

## 2018-09-09 ENCOUNTER — Ambulatory Visit (INDEPENDENT_AMBULATORY_CARE_PROVIDER_SITE_OTHER): Payer: PPO

## 2018-09-09 DIAGNOSIS — I469 Cardiac arrest, cause unspecified: Secondary | ICD-10-CM

## 2018-09-09 NOTE — Progress Notes (Signed)
Remote ICD transmission.   

## 2018-09-10 LAB — CUP PACEART REMOTE DEVICE CHECK
Battery Remaining Longevity: 87 mo
Battery Voltage: 2.99 V
Brady Statistic AP VP Percent: 0 %
Brady Statistic AP VS Percent: 0 %
Brady Statistic AS VP Percent: 0 %
Brady Statistic AS VS Percent: 0 %
Brady Statistic RA Percent Paced: 0 %
Brady Statistic RV Percent Paced: 95.12 %
Date Time Interrogation Session: 20191230083425
HighPow Impedance: 72 Ohm
Implantable Lead Implant Date: 20181228
Implantable Lead Implant Date: 20181228
Implantable Lead Implant Date: 20181228
Implantable Lead Location: 753858
Implantable Lead Location: 753859
Implantable Lead Location: 753860
Implantable Lead Model: 4598
Implantable Lead Model: 5076
Implantable Pulse Generator Implant Date: 20181228
Lead Channel Impedance Value: 178.5 Ohm
Lead Channel Impedance Value: 178.5 Ohm
Lead Channel Impedance Value: 189.073
Lead Channel Impedance Value: 199.5 Ohm
Lead Channel Impedance Value: 212.8 Ohm
Lead Channel Impedance Value: 323 Ohm
Lead Channel Impedance Value: 323 Ohm
Lead Channel Impedance Value: 399 Ohm
Lead Channel Impedance Value: 399 Ohm
Lead Channel Impedance Value: 399 Ohm
Lead Channel Impedance Value: 4047 Ohm
Lead Channel Impedance Value: 456 Ohm
Lead Channel Impedance Value: 513 Ohm
Lead Channel Impedance Value: 665 Ohm
Lead Channel Impedance Value: 665 Ohm
Lead Channel Impedance Value: 665 Ohm
Lead Channel Impedance Value: 703 Ohm
Lead Channel Impedance Value: 760 Ohm
Lead Channel Pacing Threshold Amplitude: 0.375 V
Lead Channel Pacing Threshold Amplitude: 0.625 V
Lead Channel Pacing Threshold Pulse Width: 0.4 ms
Lead Channel Pacing Threshold Pulse Width: 0.4 ms
Lead Channel Sensing Intrinsic Amplitude: 5.125 mV
Lead Channel Sensing Intrinsic Amplitude: 5.125 mV
Lead Channel Setting Pacing Amplitude: 1.25 V
Lead Channel Setting Pacing Amplitude: 2.5 V
Lead Channel Setting Pacing Pulse Width: 0.4 ms
Lead Channel Setting Pacing Pulse Width: 0.4 ms
Lead Channel Setting Sensing Sensitivity: 0.3 mV

## 2018-09-13 ENCOUNTER — Other Ambulatory Visit (HOSPITAL_COMMUNITY): Payer: Self-pay

## 2018-09-13 MED ORDER — BISOPROLOL FUMARATE 5 MG PO TABS: 2.5000 mg | ORAL_TABLET | Freq: Two times a day (BID) | ORAL | 2 refills | Status: DC

## 2018-09-16 DIAGNOSIS — M19079 Primary osteoarthritis, unspecified ankle and foot: Secondary | ICD-10-CM | POA: Diagnosis not present

## 2018-09-16 DIAGNOSIS — I480 Paroxysmal atrial fibrillation: Secondary | ICD-10-CM | POA: Diagnosis not present

## 2018-09-16 DIAGNOSIS — I5042 Chronic combined systolic (congestive) and diastolic (congestive) heart failure: Secondary | ICD-10-CM | POA: Diagnosis not present

## 2018-09-18 ENCOUNTER — Inpatient Hospital Stay (HOSPITAL_COMMUNITY): Payer: PPO

## 2018-09-19 ENCOUNTER — Ambulatory Visit (HOSPITAL_COMMUNITY)
Admission: RE | Admit: 2018-09-19 | Discharge: 2018-09-19 | Disposition: A | Discharge status: Home / Self Care | Payer: PPO | Source: Ambulatory Visit | Attending: Internal Medicine | Admitting: Internal Medicine

## 2018-09-19 DIAGNOSIS — I509 Heart failure, unspecified: Secondary | ICD-10-CM | POA: Diagnosis not present

## 2018-09-19 LAB — BASIC METABOLIC PANEL
Anion gap: 7 (ref 5–15)
BUN: 21 mg/dL (ref 8–23)
CO2: 26 mmol/L (ref 22–32)
Calcium: 8.6 mg/dL — ABNORMAL LOW (ref 8.9–10.3)
Chloride: 104 mmol/L (ref 98–111)
Creatinine, Ser: 1.25 mg/dL — ABNORMAL HIGH (ref 0.44–1.00)
GFR calc Af Amer: 47 mL/min — ABNORMAL LOW (ref >60)
GFR calc non Af Amer: 40 mL/min — ABNORMAL LOW (ref >60)
Glucose, Bld: 91 mg/dL (ref 70–99)
Potassium: 3.9 mmol/L (ref 3.5–5.1)
Sodium: 137 mmol/L (ref 135–145)

## 2018-09-25 ENCOUNTER — Telehealth: Payer: Self-pay | Admitting: Nurse Practitioner

## 2018-09-25 NOTE — Telephone Encounter (Signed)
Spoke to patient and advised her to keep her March appt with Dr Aundra Dubin to f/u with him on possible Echocardiogram to be scheduled.  She verbalized understanding.

## 2018-09-25 NOTE — Telephone Encounter (Signed)
New Message   Patient wants to speak to Chanetta Marshall about upcoming Echo appt.

## 2018-10-13 ENCOUNTER — Other Ambulatory Visit (HOSPITAL_COMMUNITY): Payer: Self-pay | Admitting: Cardiology

## 2018-10-24 ENCOUNTER — Other Ambulatory Visit (HOSPITAL_COMMUNITY): Payer: Self-pay | Admitting: Cardiology

## 2018-11-25 ENCOUNTER — Encounter (HOSPITAL_COMMUNITY): Payer: PPO | Admitting: Cardiology

## 2018-12-09 ENCOUNTER — Ambulatory Visit (INDEPENDENT_AMBULATORY_CARE_PROVIDER_SITE_OTHER): Payer: PPO | Admitting: *Deleted

## 2018-12-09 DIAGNOSIS — I469 Cardiac arrest, cause unspecified: Secondary | ICD-10-CM

## 2018-12-09 DIAGNOSIS — I442 Atrioventricular block, complete: Secondary | ICD-10-CM

## 2018-12-09 LAB — CUP PACEART REMOTE DEVICE CHECK
Battery Remaining Longevity: 82 mo
Battery Voltage: 2.99 V
Brady Statistic AP VP Percent: 0 %
Brady Statistic AP VS Percent: 0 %
Brady Statistic AS VP Percent: 0 %
Brady Statistic AS VS Percent: 0 %
Brady Statistic RA Percent Paced: 0 %
Brady Statistic RV Percent Paced: 97.51 %
Date Time Interrogation Session: 20200330052404
HighPow Impedance: 61 Ohm
Implantable Lead Implant Date: 20181228
Implantable Lead Implant Date: 20181228
Implantable Lead Implant Date: 20181228
Implantable Lead Location: 753858
Implantable Lead Location: 753859
Implantable Lead Location: 753860
Implantable Lead Model: 4598
Implantable Lead Model: 5076
Implantable Pulse Generator Implant Date: 20181228
Lead Channel Impedance Value: 155.455
Lead Channel Impedance Value: 162.857
Lead Channel Impedance Value: 162.857
Lead Channel Impedance Value: 180 Ohm
Lead Channel Impedance Value: 180 Ohm
Lead Channel Impedance Value: 285 Ohm
Lead Channel Impedance Value: 323 Ohm
Lead Channel Impedance Value: 342 Ohm
Lead Channel Impedance Value: 380 Ohm
Lead Channel Impedance Value: 380 Ohm
Lead Channel Impedance Value: 380 Ohm
Lead Channel Impedance Value: 4047 Ohm
Lead Channel Impedance Value: 456 Ohm
Lead Channel Impedance Value: 551 Ohm
Lead Channel Impedance Value: 551 Ohm
Lead Channel Impedance Value: 570 Ohm
Lead Channel Impedance Value: 608 Ohm
Lead Channel Impedance Value: 646 Ohm
Lead Channel Pacing Threshold Amplitude: 0.5 V
Lead Channel Pacing Threshold Amplitude: 0.625 V
Lead Channel Pacing Threshold Pulse Width: 0.4 ms
Lead Channel Pacing Threshold Pulse Width: 0.4 ms
Lead Channel Sensing Intrinsic Amplitude: 2.75 mV
Lead Channel Sensing Intrinsic Amplitude: 2.75 mV
Lead Channel Setting Pacing Amplitude: 1.25 V
Lead Channel Setting Pacing Amplitude: 2.5 V
Lead Channel Setting Pacing Pulse Width: 0.4 ms
Lead Channel Setting Pacing Pulse Width: 0.4 ms
Lead Channel Setting Sensing Sensitivity: 0.3 mV

## 2018-12-18 NOTE — Progress Notes (Signed)
Remote ICD transmission.   

## 2019-01-01 ENCOUNTER — Telehealth: Payer: Self-pay | Admitting: Cardiology

## 2019-01-01 NOTE — Telephone Encounter (Signed)
Would benefit from ICM monitoring long term.  This could be particularly helpful during COVID 19. Margarita Grizzle, please call patient and offer enrollment.

## 2019-01-01 NOTE — Telephone Encounter (Signed)
Optivol suggests some fluid overload.  If she is taking Lasix 20 mg bid, increase to 40 qam/20 qpm x 3 days and take an extra 20 mEq KCl while taking extra Lasix.  She will need a BMET in 5-7 days.

## 2019-01-01 NOTE — Telephone Encounter (Signed)
Patient called and stated that she has heard the alert tone from her device twice this morning. She stated that she doesn't feel well either. Transmission received.

## 2019-01-01 NOTE — Telephone Encounter (Signed)
Spoke with patient. Advised transmission shows normal device function. No alerts or abnormalities. Patient reports she has an Apple watch with a magnetic band--could've a solid tone due to band getting too close to device. Pt reports she wouldn't have called about it normally, but she has been having some issues the past few days.  On Monday night she had an "awful spell". BP was high for her (143/97), she noticed some swelling in her ankles, and she felt like she was going to faint all evening. Her son came to spend the night because she did not want to go to the ED. She ended up taking an extra dose of bisoprolol and furosemide, along with Zofran and an Ambinen. Didn't check BP again. Woke up on Tuesday morning feeling much better.  Around noon today she began having episodes of nausea and took some Zofran. No vomiting, nausea and sweating seems to come in waves. No fever, chest pain, or ShOB. No recent BP available. She has been feeling better for the past 69min or so. Her weight is down since the extra dose of furosemide on Monday. Pt feels like she over diuresed. Took a 38mEq potassium pill that she had at home because she feels like she did last time she was hypokalemic. Also drank some orange juice. Attributes some of her symptoms to anxiety. Her son is with her now and can spend the night if she's not feeling better soon. ED precautions given if worsening symptoms. Pt verbalizes understanding.  Pt requests orders to have her potassium level checked. Advised not to take additional potassium supplementation unless instructed by provider. Advised I will route this message to Dr. Rayann Heman and Dr. Aundra Dubin for recommendations. Pt is agreeable to this plan and thanked me for my call back.

## 2019-01-02 NOTE — Telephone Encounter (Signed)
Pt has televisit with Dr Aundra Dubin on 4/24

## 2019-01-03 ENCOUNTER — Ambulatory Visit (HOSPITAL_COMMUNITY): Admission: RE | Admit: 2019-01-03 | Payer: PPO | Source: Ambulatory Visit | Admitting: Cardiology

## 2019-01-03 ENCOUNTER — Other Ambulatory Visit: Payer: Self-pay

## 2019-01-03 ENCOUNTER — Telehealth: Payer: Self-pay

## 2019-01-03 NOTE — Telephone Encounter (Signed)
Attempted 2nd patient call and no answer.

## 2019-01-03 NOTE — Telephone Encounter (Signed)
Referred to ICM clinic by Dr Rayann Heman.   Attempted call to patient for ICM intro and left message for return call to discuss sending remote transmission for Dr Aundra Dubin to review before telehealth visit this morning.  Per 01/01/2019 phone note, Optivol showed fluid accumulation and patient had not been feeling prior to her 4/22 call to device clinic.  Recommendation by Dr Aundra Dubin at that time was to increase to 40 qam/20 qpm x 3 days and take an extra 20 mEq KCl while taking extra Lasix and BMET in 5-7 days.

## 2019-01-09 DIAGNOSIS — I480 Paroxysmal atrial fibrillation: Secondary | ICD-10-CM | POA: Diagnosis not present

## 2019-01-09 DIAGNOSIS — M8580 Other specified disorders of bone density and structure, unspecified site: Secondary | ICD-10-CM | POA: Diagnosis not present

## 2019-01-09 DIAGNOSIS — I5042 Chronic combined systolic (congestive) and diastolic (congestive) heart failure: Secondary | ICD-10-CM | POA: Diagnosis not present

## 2019-01-09 DIAGNOSIS — M19079 Primary osteoarthritis, unspecified ankle and foot: Secondary | ICD-10-CM | POA: Diagnosis not present

## 2019-01-21 NOTE — Telephone Encounter (Signed)
Spoke with patient and reviewed remote transmission.  Advised report suggested fluid accumulation from 01/01/2019 until today when returned to baseline.  She is feeling much better today than Saturday.  She says she is aware she needs to limit salt intake make improvement in diet.  Scheduled next remote transmission 02/10/2019.

## 2019-01-21 NOTE — Telephone Encounter (Signed)
Spoke with patient and ICM intro given.  She agreed to monthly follow up.  She reports she had fluid accumulation this past weekend and was short of breath.  She had been eating pretzels and unaware of salt amount. She took extra Furosemide which has resolved the shortness of breath.  Advised to send a remote transmission today and will review to check if she still has fluid accumulation.  She said she will send a report.  She is feeling fine now.  Advised will call back after reviewing transmission.

## 2019-01-21 NOTE — Telephone Encounter (Signed)
Attempted ICM intro call and left message to return call.

## 2019-01-22 ENCOUNTER — Telehealth: Payer: Self-pay

## 2019-01-22 NOTE — Telephone Encounter (Signed)
Reviewed transmissions from 5/12 and 5/13. Normal device function. Lead trends stable. Presenting rhythm from 5/13 shows BiV pacing w/PVC. BiVP 97.4% as of 5/12 transmission and 94.9% as of 5/13 transmission. 1 VT-NS episode on 5/2, avg V rate 188bpm. 7 total Vs episodes (markers only), V rates 90s-140, longest 37sec, most recent on 01/16/19. No episodes to correlate with symptoms today.  Routed to Dr. Aundra Dubin as Juluis Rainier.

## 2019-01-22 NOTE — Telephone Encounter (Signed)
Returned call to patient as requested by voice mail message.  She report having episodes of feeling cold alternating with sweating today. She says she feels really bad today and was fine yesterday.  BP this AM was 90/64 and after doing some things around the house it increased to 130/84.  She has taken 0.5 PRN Zofran tablet this AM for nausea which was recently prescribed by PCP.  She also took Zofran on 01/18/2019 for nausea.  Her son has placed a call to Dr Claris Gladden office this AM and patient has contacted Dr Laurann Montana PCP this AM and waiting on response.  Advised if condition worsens to use ER if needed.  She is unsure if her symptoms are related to the anxiety she feels due to [place].  She is asking if she should be taking Zofran with her heart condition.  Will consult [place] pharmacist regarding Zofran.  Routed to Dr Aundra Dubin for review.

## 2019-01-22 NOTE — Telephone Encounter (Signed)
Attempted call back to patient to advise to send remote transmission.  Received Zofran recommendation from [facility], [place].  She reports Zofran can prolong QTc but it would be okay to use the medication about once a month.  If she needs a medication for nausea on more frequent basis, then she can try prochlorperazine (compazine) that does not affect QTc.  She said Zofran should not cause the side effects that patient is experiencing, increased BP, cold/sweating.

## 2019-01-22 NOTE — Telephone Encounter (Signed)
Call to patient and advised received Zofran recommendation from [facility], [place].  She reports Zofran can prolong QTc but it would be okay to use the medication about once a month.  If she needs a medication for nausea on more frequent basis, then she can try prochlorperazine (compazine) that does not affect QTc.  She said Zofran should not cause the side effects that patient is experiencing, increased BP, cold/sweating.   Assisted in sending remote transmission for review.  Will send to device clinic for review.  Patient is starting to feel better than she did earlier when I spoke with her today.  PCP office called her back and said since she was having some constipation problems this could be related to her not feeling well this morning.  They recommended suppositories to help with bowel movement.

## 2019-01-22 NOTE — Telephone Encounter (Signed)
Optivol Impedance suggests fluid accumulation from 01/02/2019 until 01/20/2019.  Patient took extra Furosemide this past weekend and impedance returned to normal.  Device clinic will review entire report for any abnormal episodes.

## 2019-01-22 NOTE — Telephone Encounter (Signed)
Please get a remote device check on her to see what her volume looks like and make sure no other events other than her baseline chronic afib.  Think she can take Zofran occasionally if needed.

## 2019-01-30 ENCOUNTER — Telehealth (HOSPITAL_COMMUNITY): Payer: Self-pay | Admitting: Cardiology

## 2019-01-30 DIAGNOSIS — I509 Heart failure, unspecified: Secondary | ICD-10-CM

## 2019-01-30 NOTE — Telephone Encounter (Signed)
Can get CMET, CBC, TSH.  Needs followup set up.

## 2019-01-30 NOTE — Telephone Encounter (Signed)
Patient called to request labs -reports she was dealing with fluid overload a week or so ago, recently has episodes of diarrhea -reports increase in dizziness -would like to have [place] come to her home to draw labs and feels labs may give some more answers as to why she keeps having these spells ?dehydration with diarrhea

## 2019-01-30 NOTE — Telephone Encounter (Signed)
Patient would like to have labs done by [facility] Orders faxed to [phone]  And patient is scheduled for follow up on 6/2 with Dr Aundra Dubin and would like to keep that appt

## 2019-02-07 DIAGNOSIS — M19079 Primary osteoarthritis, unspecified ankle and foot: Secondary | ICD-10-CM | POA: Diagnosis not present

## 2019-02-07 DIAGNOSIS — M8580 Other specified disorders of bone density and structure, unspecified site: Secondary | ICD-10-CM | POA: Diagnosis not present

## 2019-02-07 DIAGNOSIS — I480 Paroxysmal atrial fibrillation: Secondary | ICD-10-CM | POA: Diagnosis not present

## 2019-02-07 DIAGNOSIS — I5042 Chronic combined systolic (congestive) and diastolic (congestive) heart failure: Secondary | ICD-10-CM | POA: Diagnosis not present

## 2019-02-10 ENCOUNTER — Ambulatory Visit (INDEPENDENT_AMBULATORY_CARE_PROVIDER_SITE_OTHER): Payer: PPO

## 2019-02-10 DIAGNOSIS — I509 Heart failure, unspecified: Secondary | ICD-10-CM | POA: Diagnosis not present

## 2019-02-10 DIAGNOSIS — Z95810 Presence of automatic (implantable) cardiac defibrillator: Secondary | ICD-10-CM

## 2019-02-10 NOTE — Progress Notes (Signed)
EPIC Encounter for ICM Monitoring  Patient Name: Cheryl Vargas is a 84 y.o. female Date: 02/10/2019 Primary Care Physican: Lavone Orn, MD Primary Cardiologist: Aundra Dubin Electrophysiologist: Allred Bi-V Pacing: 97.4%  02/10/2019 Weight: 141.2 lbs        Heart Failure questions reviewed.  Pt symptomatic with minimal swelling of the feet.   Optivol Thoracic impedance abnormal suggesting fluid accumulation starting 01/31/2019.   Prescribed: Furosemide 20 mg take 2 tablets by mouth in the morning and 1 tablet in the afternoon. Take an extra tablets daily as needed for weight gain of greater than 3 pounds. Taking Furosemide Differently: Taking PRN based on weight.  Recommendations:  Reinforced limiting salt intake to < 2000 mg daily and fluid intake to 64 oz daily.    Follow-up plan: ICM clinic phone appointment on 03/17/2019.   Office appt 02/11/2019 with Dr. Aundra Dubin.    Copy of ICM check sent to Dr. Rayann Heman and Dr Aundra Dubin.   3 month ICM trend: 02/10/2019    1 Year ICM trend:       Rosalene Billings, RN 02/10/2019 4:33 PM

## 2019-02-11 ENCOUNTER — Ambulatory Visit (HOSPITAL_COMMUNITY)
Admission: RE | Admit: 2019-02-11 | Discharge: 2019-02-11 | Disposition: A | Discharge status: Home / Self Care | Payer: PPO | Source: Ambulatory Visit | Attending: Cardiology | Admitting: Cardiology

## 2019-02-11 ENCOUNTER — Other Ambulatory Visit: Payer: Self-pay

## 2019-02-11 VITALS — BP 106/66 | HR 87 | Wt 141.1 lb

## 2019-02-11 DIAGNOSIS — E785 Hyperlipidemia, unspecified: Secondary | ICD-10-CM | POA: Insufficient documentation

## 2019-02-11 DIAGNOSIS — Z79899 Other long term (current) drug therapy: Secondary | ICD-10-CM | POA: Diagnosis not present

## 2019-02-11 DIAGNOSIS — I5022 Chronic systolic (congestive) heart failure: Secondary | ICD-10-CM | POA: Insufficient documentation

## 2019-02-11 DIAGNOSIS — I428 Other cardiomyopathies: Secondary | ICD-10-CM | POA: Insufficient documentation

## 2019-02-11 DIAGNOSIS — I493 Ventricular premature depolarization: Secondary | ICD-10-CM | POA: Insufficient documentation

## 2019-02-11 DIAGNOSIS — I340 Nonrheumatic mitral (valve) insufficiency: Secondary | ICD-10-CM | POA: Insufficient documentation

## 2019-02-11 DIAGNOSIS — I110 Hypertensive heart disease with heart failure: Secondary | ICD-10-CM | POA: Diagnosis not present

## 2019-02-11 DIAGNOSIS — R0602 Shortness of breath: Secondary | ICD-10-CM | POA: Insufficient documentation

## 2019-02-11 DIAGNOSIS — Z7901 Long term (current) use of anticoagulants: Secondary | ICD-10-CM | POA: Diagnosis not present

## 2019-02-11 DIAGNOSIS — I482 Chronic atrial fibrillation, unspecified: Secondary | ICD-10-CM | POA: Diagnosis not present

## 2019-02-11 DIAGNOSIS — I509 Heart failure, unspecified: Secondary | ICD-10-CM

## 2019-02-11 DIAGNOSIS — I480 Paroxysmal atrial fibrillation: Secondary | ICD-10-CM | POA: Insufficient documentation

## 2019-02-11 DIAGNOSIS — I4821 Permanent atrial fibrillation: Secondary | ICD-10-CM | POA: Diagnosis not present

## 2019-02-11 LAB — BASIC METABOLIC PANEL
Anion gap: 10 (ref 5–15)
BUN: 18 mg/dL (ref 8–23)
CO2: 23 mmol/L (ref 22–32)
Calcium: 9.1 mg/dL (ref 8.9–10.3)
Chloride: 105 mmol/L (ref 98–111)
Creatinine, Ser: 1.24 mg/dL — ABNORMAL HIGH (ref 0.44–1.00)
GFR calc Af Amer: 47 mL/min — ABNORMAL LOW (ref >60)
GFR calc non Af Amer: 41 mL/min — ABNORMAL LOW (ref >60)
Glucose, Bld: 107 mg/dL — ABNORMAL HIGH (ref 70–99)
Potassium: 4.1 mmol/L (ref 3.5–5.1)
Sodium: 138 mmol/L (ref 135–145)

## 2019-02-11 LAB — CBC
HCT: 44.1 % (ref 36.0–46.0)
Hemoglobin: 15.2 g/dL — ABNORMAL HIGH (ref 12.0–15.0)
MCH: 32 pg (ref 26.0–34.0)
MCHC: 34.5 g/dL (ref 30.0–36.0)
MCV: 92.8 fL (ref 80.0–100.0)
Platelets: 169 10*3/uL (ref 150–400)
RBC: 4.75 MIL/uL (ref 3.87–5.11)
RDW: 12.1 % (ref 11.5–15.5)
WBC: 6.5 10*3/uL (ref 4.0–10.5)
nRBC: 0 % (ref 0.0–0.2)

## 2019-02-11 MED ORDER — FUROSEMIDE 20 MG PO TABS: 20.0000 mg | ORAL_TABLET | Freq: Every day | ORAL | 11 refills | Status: DC

## 2019-02-11 MED ORDER — POTASSIUM CHLORIDE CRYS ER 10 MEQ PO TBCR: 10.0000 meq | EXTENDED_RELEASE_TABLET | Freq: Every day | ORAL | 11 refills | Status: DC

## 2019-02-11 NOTE — Progress Notes (Signed)
Advanced Heart Failure Clinic Note   Primary Care: Dr Laurann Montana HF Cardiology: Dr. Aundra Dubin  HPI: Cheryl Vargas is an 84 y.o. female, retired [employer] scientist, with a past medical history of HTN, HLD, PAF (On Xarelto), and nonischemic cardiomyopathy with chronic systolic CHF. Cardiomyopathy dates back to at least 2014 based on echoes (EF 30-35% in 2014).   Admitted 8/17 for fatigue and dyspnea.  She was seen by electrophysiology on 04/17/16, and was started on po Amiodarone as she reported increased palpitations and fatigue with frequent PVCs. Her BNP was elevated at 1592.6 and chest x ray was consistent with CHF.  She was diuresed.  With her complaints of new onset fatigue and dyspnea combined with her known LV dysfunction, it was felt that she would benefit from an ischemic evaluation by cath. She underwent left heart cath with normal cors. Amiodarone was later stopped due to nausea.  Discharge weight 143 pounds. Echo (8/17) with EF 40-45%, inferoseptal akinesis. Cardiac MRI 10/17 with EF 36%, LGE mid wall pattern in the basal to mid septum and inferior wall. Holter monitor in 9/17 with runs of atrial fibrillation/RVR.  She has had trouble tolerating cardiac meds due to hypotension/lightheadedness.    She has had difficulty with symptomatic atrial fibrillation. She developed symptomatic atrial fibrillation with RVR again in 10/18, difficult to control rate.  She had TEE-guided DCCV with resumption of NSR. On TEE in rapid atrial fibrillation, EF 20-25%.  On TTE after DCCV, EF back to 40-45% range.  MR reported as moderate to severe but I reviewed echo and think it appears more in the moderate range.    She had recurrent atrial fibrillation after 10/18 DCCV. She was admitted in 12/18 with atrial fibrillation and RVR, she also had a run of WCT thought to be VT.   The atrial fibrillation was very difficult to control.  She was thought to be a poor candidate for atrial fibrillation ablation.  She finally had AV nodal  ablation with Medtronic CRT-D device in 12/18. She was then admitted briefly with CHF in 1/19.  Echo in 12/18 showed EF 25-30%, moderate MR, moderate TR, severe LAE.  Echo in 4/19 showed EF 25-30%, diffuse hypokinesis, mild MR, moderately decreased RV systolic function.   Admitted 4/7 - 12/21/17 with A/C CHF. Found to have decreased BiV pacing in setting of frequent PVCs. Tried on Mexiletine but failed due to nausea. Switched to Ranexa. Diuresed with IV lasix.   In 5/19, she was admitted with a near-syncopal episode that occurred while straining for a bowel movement.  Suspected vagal event.   She has stopped ranolazine due to nausea.  She did not tolerate increase in bisoprolol after last appointment.   She returns for followup of atrial fibrillation and CHF today.  She has noted bendopnea, no orthopnea/PND.  No palpitations.  She is short of breath when she tries to walk and talk at the same time.  No dyspnea walking on flat ground.  Able to do housework but short of breath carrying laundary.  She has only been taking Lasix prn, generally uses it once a week (I had asked her to take 20 mg bid but she feels like 20 mg bid Lasix "dries her out").     ECG (personally reviewed): atrial fibrillation, BiV paced  Medtronic device interrogation: 97% BiV pacing, decreased thoracic impedance.   Labs (8/17): K 4.2, creatinine 0.85, BNP 1593, HCT 43.2, TSH mild elevated 5.3, free T4 normal Labs (9/17): K 4.1, creatinine 0.98 => 0.92, BNP  258, SPEP negative Labs (2/18): K 4, creatinine 0.83, HCT 40.8 Labs (10/18): K 4.2, creatinine 0.94, hgb 14.9 Labs (1/19): K 4.9, creatinine 0.72 Labs (2/19): K 4.1, creatinine 0.97 Labs (5/19): K 3.4, creatinine 1.17 Labs (6/19): K 3.9, creatinine 1.2 Labs (8/19): K 3.7, creatinine 1.14 Labs (1/20): K 3.9, creatinine 1.25  1. Chronic systolic CHF:  Nonischemic cardiomyopathy.   - Echo (2014): EF 30-35%. - Echo (2/17): EF 40-45% - Echo (8/17): EF 40-45%, mid to  apical inferoseptal akinesis - Coronary angiography (8/17): No significant CAD.  - Cardiac MRI (10/17): EF 36%, moderate MR, normal RV size and systolic function, mid-wall LGE in the basal to mid septum and inferior wall.  - TEE (10/18): EF 20-25% but in rapid afib with HR up to 150 bpm, mild MR.  - Echo (10/18): EF 40-45%, diffuse hypokinesis, moderate LAE, reportedly moderate-severe MR (looks moderate on my review).  - Echo (12/18): EF 25-30%, moderate MR, moderate TR, severe LAE - Medtronic CRT-D device s/p AV nodal ablation.  - Echo (4/19): EF 25-30%, diffuse hypokinesis, mild MR, moderately decreased RV systolic function.  2. Atrial fibrillation: Paroxysmal.  Diagnosed 2/17.  Unable to tolerate amiodarone.  - Holter (9/17): atrial fibrillation with RVR runs, 5% PVCs.  - AV nodal ablation with BiV pacing in 12/18.  - Nausea with ranolazine.  3. Hyperlipidemia 4. PVCs: frequent.  5. Long QT interval  Review of systems complete and found to be negative unless listed in HPI.    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 Occasional ETOH, never heavy.    Current Outpatient Medications  Medication Sig Dispense Refill  . bisoprolol (ZEBETA) 5 MG tablet Take 0.5 tablets (2.5 mg total) by mouth 2 (two) times daily. 60 tablet 2  . fluticasone (FLONASE) 50 MCG/ACT nasal spray Place 1 spray into both nostrils as needed for allergies or rhinitis.    . furosemide (LASIX) 20 MG tablet Take 1 tablet (20 mg total) by mouth daily. 30 tablet 11  . losartan (COZAAR) 25 MG tablet Take 0.5 tablets (12.5 mg total) by mouth at bedtime. 45 tablet 3  . spironolactone (ALDACTONE) 25 MG tablet Take 0.5 tablets (12.5 mg total) by mouth daily. 45 tablet 3  . XARELTO 15 MG TABS tablet TAKE 1 TABLET BY MOUTH DAILY WITH SUPPER( RESUME ON JAN 2) 30 tablet 11  . zolpidem (AMBIEN) 5 MG tablet Take 1 tablet (5 mg total) by mouth at bedtime. 5 tablet 0   . potassium chloride (K-DUR) 10 MEQ tablet Take 1 tablet (10 mEq total) by mouth daily. 30 tablet 11   No current facility-administered medications for this encounter.     Vitals:   02/11/19 1115  BP: 106/66  Pulse: 87  SpO2: 97%  Weight: 64 kg (141 lb 2 oz)   Wt Readings from Last 3 Encounters:  02/11/19 64 kg (141 lb 2 oz)  08/26/18 64 kg (141 lb)  07/25/18 64.6 kg (142 lb 6.4 oz)    PHYSICAL EXAM: General: NAD Neck: JVP 8 cm, no thyromegaly or thyroid nodule.  Lungs: Clear to auscultation bilaterally with normal respiratory effort. CV: Nondisplaced PMI.  Heart regular S1/S2, no S3/S4, no murmur.  No peripheral edema.  No carotid bruit.  Normal pedal pulses.  Abdomen: Soft, nontender, no hepatosplenomegaly, no distention.  Skin: Intact without lesions or rashes.  Neurologic: Alert and oriented x 3.  Psych: Normal affect. Extremities: No clubbing or  cyanosis.  HEENT: Normal.   ASSESSMENT & PLAN:  1. Chronic systolic HF: NICM, LHC 0/9311 showed no significant coronary disease. Cardiac MRI 10/17 EF 36%, diffuse HK, normal RV, biatrial enlargement, moderate MR, non-coronary LGE pattern involving the mid-wall of the basal to mid septum and inferior wall. Possibly prior myocarditis versus a form of infiltrative disease. Echo 12/17/17: EF 25-30%, severe diffuse HK, mild AI, mild MR, mod systolic dysfunction RV, LA dilation, RA dilation, moderate TR.  Medtronic CRT-D s/p AV nodal ablation.  NYHA class II-III symptoms, fairly stable.  Exam suggests mild volume overload, Medtronic device interrogation shows decreased impedance suggestive of elevated volume.     - I will have her take Lasix 20 mg daily (she had been taking Lasix about once a week) and KCl 10 daily.  BMET today and in 2 wks.  -Continue bisoprolol 2.5 mg bid.  Unable to tolerate increase.   -Continuelosartan 12.5 mg qHS. Unable to tolerate increase.  - Continue spironolactone 12.5 daily.  Unable to tolerate increase.  - I  will repeat echo in 3 months with followup.  2. Atrial fibrillation: Chronic, s/p AV nodal ablation and BiV pacing.  - Continue Xarelto 15 mg daily. CBC today.  3. PVCs: Frequent PVCs have limited BiV pacing.  Not a good PVC ablation candidate per EP. Cannot tolerate amiodarone, sotolol, or Tikosyn. Did not tolerate mexilitine with nausea. Most recently, stopped ranolazine due to nausea. Additionally, did not tolerate further uptitration of bisoprolol.  - I am not sure we have a lot of further options here to suppress her PVCs.  4. Mitral regurgitation: Mild on 4/19 echo. - Repeat echo in 3 months with followup.  5. H/o pulseless VT: Has ICD.   Followup in 3 months with echo.   Cheryl Champagne, MD  02/11/2019

## 2019-02-11 NOTE — Patient Instructions (Signed)
CHANGE Lasix to 20 mg, one tab daily CHANGE Potassium to 10 MEQ, one tab daily  Labs today We will only contact you if something comes back abnormal or we need to make some changes. Otherwise no news is good news!  Labs needed in 2 weeks  Your physician recommends that you schedule a follow-up appointment in: 3 months with Dr Aundra Dubin and echocardiogram  Your physician has requested that you have an echocardiogram. Echocardiography is a painless test that uses sound waves to create images of your heart. It provides your doctor with information about the size and shape of your heart and how well your heart's chambers and valves are working. This procedure takes approximately one hour. There are no restrictions for this procedure.  Do the following things EVERYDAY: 1) Weigh yourself in the morning before breakfast. Write it down and keep it in a log. 2) Take your medicines as prescribed 3) Eat low salt foods-Limit salt (sodium) to 2000 mg per day.  4) Stay as active as you can everyday 5) Limit all fluids for the day to less than 2 liters

## 2019-02-11 NOTE — Progress Notes (Signed)
Medication Samples have been provided to the patient.  Drug name: xarelto       Strength: 15 mg        Qty: 28  LOT: [id number]  Exp.Date: 04/2020  Dosing instructions:ONE TAB DAILY WITH DINNER  The patient has been instructed regarding the correct time, dose, and frequency of taking this medication, including desired effects and most common side effects.   Kerry Dory 11:52 AM 02/11/2019

## 2019-02-17 ENCOUNTER — Other Ambulatory Visit (HOSPITAL_COMMUNITY): Payer: Self-pay

## 2019-02-17 MED ORDER — SPIRONOLACTONE 25 MG PO TABS: 12.5000 mg | ORAL_TABLET | Freq: Every day | ORAL | 3 refills | Status: DC

## 2019-02-25 ENCOUNTER — Ambulatory Visit (HOSPITAL_COMMUNITY)
Admission: RE | Admit: 2019-02-25 | Discharge: 2019-02-25 | Disposition: A | Discharge status: Home / Self Care | Payer: PPO | Source: Ambulatory Visit | Attending: Internal Medicine | Admitting: Internal Medicine

## 2019-02-25 ENCOUNTER — Other Ambulatory Visit: Payer: Self-pay

## 2019-02-25 DIAGNOSIS — I509 Heart failure, unspecified: Secondary | ICD-10-CM | POA: Diagnosis not present

## 2019-02-25 LAB — BASIC METABOLIC PANEL
Anion gap: 10 (ref 5–15)
BUN: 23 mg/dL (ref 8–23)
CO2: 25 mmol/L (ref 22–32)
Calcium: 9.3 mg/dL (ref 8.9–10.3)
Chloride: 104 mmol/L (ref 98–111)
Creatinine, Ser: 1.34 mg/dL — ABNORMAL HIGH (ref 0.44–1.00)
GFR calc Af Amer: 43 mL/min — ABNORMAL LOW (ref >60)
GFR calc non Af Amer: 37 mL/min — ABNORMAL LOW (ref >60)
Glucose, Bld: 96 mg/dL (ref 70–99)
Potassium: 3.8 mmol/L (ref 3.5–5.1)
Sodium: 139 mmol/L (ref 135–145)

## 2019-03-07 ENCOUNTER — Telehealth (HOSPITAL_COMMUNITY): Payer: Self-pay | Admitting: Cardiology

## 2019-03-07 DIAGNOSIS — M19079 Primary osteoarthritis, unspecified ankle and foot: Secondary | ICD-10-CM | POA: Diagnosis not present

## 2019-03-07 DIAGNOSIS — I480 Paroxysmal atrial fibrillation: Secondary | ICD-10-CM | POA: Diagnosis not present

## 2019-03-07 DIAGNOSIS — M8580 Other specified disorders of bone density and structure, unspecified site: Secondary | ICD-10-CM | POA: Diagnosis not present

## 2019-03-07 DIAGNOSIS — I5042 Chronic combined systolic (congestive) and diastolic (congestive) heart failure: Secondary | ICD-10-CM | POA: Diagnosis not present

## 2019-03-07 DIAGNOSIS — I509 Heart failure, unspecified: Secondary | ICD-10-CM

## 2019-03-07 MED ORDER — SPIRONOLACTONE 25 MG PO TABS: 25.0000 mg | ORAL_TABLET | Freq: Every day | ORAL | 3 refills | Status: DC

## 2019-03-07 NOTE — Telephone Encounter (Signed)
Give spironolactone 25 mg daily. Will need BMET 10 days.

## 2019-03-07 NOTE — Telephone Encounter (Signed)
Patients PCP office called to report patient will need new rx for spiro, during a nurse check up patient reported 25 mg of spiro worked better for LE edema   Will confirm with provider if it ok to refill/continue at increased dose as patient was only taking 12.5 mg daily.  Upstream pharmacy.

## 2019-03-07 NOTE — Telephone Encounter (Signed)
Pt aware and reports she will call our office for labs at her earliest convenience

## 2019-03-12 ENCOUNTER — Telehealth: Payer: Self-pay | Admitting: Internal Medicine

## 2019-03-12 ENCOUNTER — Ambulatory Visit (INDEPENDENT_AMBULATORY_CARE_PROVIDER_SITE_OTHER): Payer: PPO | Admitting: *Deleted

## 2019-03-12 DIAGNOSIS — I469 Cardiac arrest, cause unspecified: Secondary | ICD-10-CM

## 2019-03-12 NOTE — Telephone Encounter (Signed)
New message   Patient needs her device checked patient is trying to see if she is retaining fluids. Please call to discuss.

## 2019-03-12 NOTE — Telephone Encounter (Signed)
Rerouted to device clinic

## 2019-03-12 NOTE — Telephone Encounter (Signed)
Spoke with patient. She complains of shortness of breath that started on Sunday.  She is having trouble getting her breath and has to stop what she is doing to catch her breath.  She can tell it is worse when she bends over.  Today she says she felt nauseated when she first got up this morning but as we are speaking she is feeling better.  She sent remote transmission while on the phone for review.  Report suggested possible fluid accumulation starting 6/23 and almost back to baseline today.  She says she has a call into her PCP and will discuss with him.  She said she is feeling okay at the moment.  Advised if condition worsens to use ER if needed.     Optivol trend 03/12/2019.

## 2019-03-13 DIAGNOSIS — R0602 Shortness of breath: Secondary | ICD-10-CM | POA: Diagnosis not present

## 2019-03-13 DIAGNOSIS — R110 Nausea: Secondary | ICD-10-CM | POA: Diagnosis not present

## 2019-03-13 LAB — CUP PACEART REMOTE DEVICE CHECK
Battery Remaining Longevity: 81 mo
Battery Voltage: 2.98 V
Brady Statistic AP VP Percent: 0 %
Brady Statistic AP VS Percent: 0 %
Brady Statistic AS VP Percent: 0 %
Brady Statistic AS VS Percent: 0 %
Brady Statistic RA Percent Paced: 0 %
Brady Statistic RV Percent Paced: 95.16 %
Date Time Interrogation Session: 20200701132840
HighPow Impedance: 66 Ohm
Implantable Lead Implant Date: 20181228
Implantable Lead Implant Date: 20181228
Implantable Lead Implant Date: 20181228
Implantable Lead Location: 753858
Implantable Lead Location: 753859
Implantable Lead Location: 753860
Implantable Lead Model: 4598
Implantable Lead Model: 5076
Implantable Pulse Generator Implant Date: 20181228
Lead Channel Impedance Value: 180 Ohm
Lead Channel Impedance Value: 180 Ohm
Lead Channel Impedance Value: 184.154
Lead Channel Impedance Value: 190 Ohm
Lead Channel Impedance Value: 194.634
Lead Channel Impedance Value: 323 Ohm
Lead Channel Impedance Value: 342 Ohm
Lead Channel Impedance Value: 380 Ohm
Lead Channel Impedance Value: 380 Ohm
Lead Channel Impedance Value: 399 Ohm
Lead Channel Impedance Value: 399 Ohm
Lead Channel Impedance Value: 4047 Ohm
Lead Channel Impedance Value: 513 Ohm
Lead Channel Impedance Value: 646 Ohm
Lead Channel Impedance Value: 646 Ohm
Lead Channel Impedance Value: 665 Ohm
Lead Channel Impedance Value: 665 Ohm
Lead Channel Impedance Value: 722 Ohm
Lead Channel Pacing Threshold Amplitude: 0.5 V
Lead Channel Pacing Threshold Amplitude: 0.5 V
Lead Channel Pacing Threshold Pulse Width: 0.4 ms
Lead Channel Pacing Threshold Pulse Width: 0.4 ms
Lead Channel Sensing Intrinsic Amplitude: 14.375 mV
Lead Channel Sensing Intrinsic Amplitude: 14.375 mV
Lead Channel Setting Pacing Amplitude: 1 V
Lead Channel Setting Pacing Amplitude: 2.5 V
Lead Channel Setting Pacing Pulse Width: 0.4 ms
Lead Channel Setting Pacing Pulse Width: 0.4 ms
Lead Channel Setting Sensing Sensitivity: 0.3 mV

## 2019-03-17 ENCOUNTER — Telehealth: Payer: Self-pay

## 2019-03-17 ENCOUNTER — Ambulatory Visit (INDEPENDENT_AMBULATORY_CARE_PROVIDER_SITE_OTHER): Payer: PPO

## 2019-03-17 DIAGNOSIS — Z95810 Presence of automatic (implantable) cardiac defibrillator: Secondary | ICD-10-CM | POA: Diagnosis not present

## 2019-03-17 DIAGNOSIS — I5022 Chronic systolic (congestive) heart failure: Secondary | ICD-10-CM

## 2019-03-17 NOTE — Progress Notes (Signed)
EPIC Encounter for ICM Monitoring  Patient Name: Cheryl Vargas is a 84 y.o. female Date: 03/17/2019 Primary Care Physican: Lavone Orn, MD Primary Cardiologist: Aundra Dubin Electrophysiologist: Allred Bi-V Pacing: 87.5% (decreased from 95.2% on 7/1 report)       02/10/2019 Weight: 141.2 lbs                                                            Attempted call to patient and unable to reach.  Left message to return call. Transmission reviewed.    Optivol Thoracic impedance slightly below baseline normal.  Prescribed: Furosemide 20 mg take 2 tablets by mouth in the morning and 1 tablet in the afternoon. Take an extra tablets daily as needed for weight gain of greater than 3 pounds. Taking Furosemide Differently: Taking PRN based on weight.  Recommendations:  Unable to reach  Follow-up plan: ICM clinic phone appointment on 04/21/2019.      Copy of ICM check sent to Dr. Rayann Heman.   3 month ICM trend: 03/17/2019    1 Year ICM trend:       Rosalene Billings, RN 03/17/2019 9:23 AM

## 2019-03-17 NOTE — Progress Notes (Signed)
Pt returned call and she said she is feeling fine.  The SOB breath she had last week has resolved and she has no complaints today.  Transmission reviewed.  She did not understand the appt that was scheduled for 9:05 AM today.  Explained home remote appointments that she sees in [place] and if the description says Home Remote that means it is a report that comes from her home monitor between midnight and 6 AM.  No changes today

## 2019-03-17 NOTE — Telephone Encounter (Signed)
Remote ICM transmission received.  Attempted call to patient regarding ICM remote transmission and left message, per DPR, to return call.    

## 2019-03-21 ENCOUNTER — Encounter: Payer: Self-pay | Admitting: Cardiology

## 2019-03-21 NOTE — Progress Notes (Signed)
Remote ICD transmission.   

## 2019-03-22 ENCOUNTER — Telehealth: Payer: Self-pay | Admitting: Internal Medicine

## 2019-03-22 NOTE — Telephone Encounter (Signed)
Cheryl Vargas called this morning due to significant nausea this morning. She started feeling somewhat poorly yesterday. She has not had any shortness of breath, lightheadedness, dizziness or chest pain. She tried to take a shower yesterday and had to get out and sit down due to nausea. She slept well last night but then woke up with more nausea. She took a zofran at 5:45am but still feels nauseous. She would like to avoid the ER. We discussed that there is little else we can do over the phone and if she continues to feel nauseous she should come to the ER for evaluation. Her son is with her right now. She plans to wait one hour and see how she feels and then decide.

## 2019-03-25 ENCOUNTER — Telehealth: Payer: Self-pay

## 2019-03-25 DIAGNOSIS — Z1389 Encounter for screening for other disorder: Secondary | ICD-10-CM | POA: Diagnosis not present

## 2019-03-25 DIAGNOSIS — R531 Weakness: Secondary | ICD-10-CM | POA: Diagnosis not present

## 2019-03-25 DIAGNOSIS — Z0000 Encounter for general adult medical examination without abnormal findings: Secondary | ICD-10-CM | POA: Diagnosis not present

## 2019-03-25 DIAGNOSIS — I5042 Chronic combined systolic (congestive) and diastolic (congestive) heart failure: Secondary | ICD-10-CM | POA: Diagnosis not present

## 2019-03-25 NOTE — Telephone Encounter (Signed)
Returned call to patient as requested by voice mail message.  She reports she had telehealth visit with PCP this morning and discussed 2 episodes she experienced this past weekend. The symptoms she reported were nausea, shortness of breath and leg weakness.  The episodes occurred 7/10 around 8PM and 7/11 approximately 3:30 AM.  She sent remote transmission and advised there were no alert or episodes and it appears to be normal with exception she had fluid accumulation from 7/3 until today when returned back to baseline.  Advised it is possible the symptoms she prescribes could be related to fluid accumulation.  She said her weight has dropped 4 pounds since last week and she feels fine today.  Advised to continue to record when she has episodes and will check for correlation of sx and fluid accumulation at next remote

## 2019-03-31 ENCOUNTER — Encounter (HOSPITAL_COMMUNITY): Payer: PPO

## 2019-03-31 ENCOUNTER — Telehealth (HOSPITAL_COMMUNITY): Payer: Self-pay

## 2019-03-31 NOTE — Telephone Encounter (Signed)
Received call on triage line that patient would like to cancel her appt at 1:30p.  Called patient to confirm cancellation. Pt confirmed.  Pt notes she feels great and does not need to come in. She will "keep record of spells" and notify office.

## 2019-04-09 DIAGNOSIS — M19079 Primary osteoarthritis, unspecified ankle and foot: Secondary | ICD-10-CM | POA: Diagnosis not present

## 2019-04-09 DIAGNOSIS — M8580 Other specified disorders of bone density and structure, unspecified site: Secondary | ICD-10-CM | POA: Diagnosis not present

## 2019-04-09 DIAGNOSIS — I5042 Chronic combined systolic (congestive) and diastolic (congestive) heart failure: Secondary | ICD-10-CM | POA: Diagnosis not present

## 2019-04-09 DIAGNOSIS — I480 Paroxysmal atrial fibrillation: Secondary | ICD-10-CM | POA: Diagnosis not present

## 2019-04-21 ENCOUNTER — Ambulatory Visit (INDEPENDENT_AMBULATORY_CARE_PROVIDER_SITE_OTHER): Payer: PPO

## 2019-04-21 DIAGNOSIS — Z95810 Presence of automatic (implantable) cardiac defibrillator: Secondary | ICD-10-CM

## 2019-04-21 DIAGNOSIS — I5022 Chronic systolic (congestive) heart failure: Secondary | ICD-10-CM

## 2019-04-21 NOTE — Progress Notes (Signed)
EPIC Encounter for ICM Monitoring  Patient Name: OUIDA ABEYTA is a 84 y.o. female Date: 04/21/2019 Primary Care Physican: Lavone Orn, MD Primary Cardiologist:[place] Electrophysiologist:Allred Bi-V Pacing: 96.3%) 6/1/2020Weight:141.2lbs     Spoke with patient.  Transmission reviewed. She is feeling fine at this time and has no complaints.  OptivolThoracic impedance normal.  Prescribed: Furosemide20 mg take 2 tablets by mouth in the morning and 1 tablet in the afternoon. Take an extra tablets daily as needed for weight gain of greater than 3 pounds. Taking Furosemide Differently: Taking PRN based on weight.  Recommendations: No changes and encouraged to call if experiencing any fluid symptoms.  Follow-up plan: ICM clinic phone appointment on9/06/2019.   Copy of ICM check sent to Dr.Allred.   3 month ICM trend: 04/21/2019    1 Year ICM trend:       Rosalene Billings, RN 04/21/2019 11:51 AM

## 2019-04-30 ENCOUNTER — Other Ambulatory Visit (HOSPITAL_COMMUNITY): Payer: Self-pay | Admitting: Cardiology

## 2019-04-30 ENCOUNTER — Telehealth: Payer: Self-pay

## 2019-04-30 NOTE — Telephone Encounter (Signed)
Returned to call to patient as requested by voice mail message.  She describes [place] an "episode" [place] the [place].  She complains she has difficulty breathing with a mask on and her pulse was in the 50's which she thinks is low.  She reports she feels "weird" but can't pinpoint other heart symptoms.  She's had headaches for 2 days.  She sent remote transmission today and requested a review for abnormalities such as drop in heart rate.  Advised will send to device clinic for review and be notified of the results.

## 2019-04-30 NOTE — Telephone Encounter (Signed)
Call rerouted to Device Triage.

## 2019-05-01 NOTE — Telephone Encounter (Signed)
Transmission from 04/30/19 at 12:57 shows normal device function. No episodes, CRTP 98.0%, presenting rhythm BiVP @ 86bpm. Device is programmed VVIR @ 80bpm.  Returned call to patient. She reports that she feels well today. Reports episode yesterday was related to anxiety as her mask was too tight and she was worried about having to use the restroom at the car dealership. She is aware to call for any further symptoms or questions. No additional concerns at this time.

## 2019-05-05 ENCOUNTER — Encounter (HOSPITAL_COMMUNITY): Payer: Self-pay | Admitting: Emergency Medicine

## 2019-05-05 ENCOUNTER — Observation Stay (HOSPITAL_COMMUNITY)
Admission: EM | Admit: 2019-05-05 | Discharge: 2019-05-07 | Disposition: A | Discharge status: Home / Self Care | Payer: PPO | Attending: Internal Medicine | Admitting: Internal Medicine

## 2019-05-05 ENCOUNTER — Emergency Department (HOSPITAL_COMMUNITY): Payer: PPO

## 2019-05-05 ENCOUNTER — Other Ambulatory Visit: Payer: Self-pay

## 2019-05-05 DIAGNOSIS — I5042 Chronic combined systolic (congestive) and diastolic (congestive) heart failure: Secondary | ICD-10-CM | POA: Insufficient documentation

## 2019-05-05 DIAGNOSIS — Z1159 Encounter for screening for other viral diseases: Secondary | ICD-10-CM | POA: Diagnosis not present

## 2019-05-05 DIAGNOSIS — R1013 Epigastric pain: Secondary | ICD-10-CM

## 2019-05-05 DIAGNOSIS — R55 Syncope and collapse: Principal | ICD-10-CM | POA: Diagnosis present

## 2019-05-05 DIAGNOSIS — I083 Combined rheumatic disorders of mitral, aortic and tricuspid valves: Secondary | ICD-10-CM | POA: Diagnosis not present

## 2019-05-05 DIAGNOSIS — N183 Chronic kidney disease, stage 3 unspecified: Secondary | ICD-10-CM | POA: Diagnosis present

## 2019-05-05 DIAGNOSIS — R0602 Shortness of breath: Secondary | ICD-10-CM | POA: Diagnosis not present

## 2019-05-05 DIAGNOSIS — Z950 Presence of cardiac pacemaker: Secondary | ICD-10-CM | POA: Insufficient documentation

## 2019-05-05 DIAGNOSIS — Z881 Allergy status to other antibiotic agents status: Secondary | ICD-10-CM | POA: Diagnosis not present

## 2019-05-05 DIAGNOSIS — I480 Paroxysmal atrial fibrillation: Secondary | ICD-10-CM | POA: Diagnosis present

## 2019-05-05 DIAGNOSIS — Z888 Allergy status to other drugs, medicaments and biological substances status: Secondary | ICD-10-CM | POA: Insufficient documentation

## 2019-05-05 DIAGNOSIS — Z79899 Other long term (current) drug therapy: Secondary | ICD-10-CM | POA: Diagnosis not present

## 2019-05-05 DIAGNOSIS — I428 Other cardiomyopathies: Secondary | ICD-10-CM | POA: Diagnosis not present

## 2019-05-05 DIAGNOSIS — R110 Nausea: Secondary | ICD-10-CM | POA: Diagnosis not present

## 2019-05-05 DIAGNOSIS — I491 Atrial premature depolarization: Secondary | ICD-10-CM | POA: Diagnosis not present

## 2019-05-05 DIAGNOSIS — Z7901 Long term (current) use of anticoagulants: Secondary | ICD-10-CM | POA: Diagnosis not present

## 2019-05-05 DIAGNOSIS — R069 Unspecified abnormalities of breathing: Secondary | ICD-10-CM | POA: Diagnosis not present

## 2019-05-05 DIAGNOSIS — Z209 Contact with and (suspected) exposure to unspecified communicable disease: Secondary | ICD-10-CM | POA: Diagnosis not present

## 2019-05-05 LAB — COMPREHENSIVE METABOLIC PANEL
ALT: 19 U/L (ref 0–44)
AST: 27 U/L (ref 15–41)
Albumin: 3.6 g/dL (ref 3.5–5.0)
Alkaline Phosphatase: 55 U/L (ref 38–126)
Anion gap: 13 (ref 5–15)
BUN: 24 mg/dL — ABNORMAL HIGH (ref 8–23)
CO2: 19 mmol/L — ABNORMAL LOW (ref 22–32)
Calcium: 9.1 mg/dL (ref 8.9–10.3)
Chloride: 105 mmol/L (ref 98–111)
Creatinine, Ser: 1.15 mg/dL — ABNORMAL HIGH (ref 0.44–1.00)
GFR calc Af Amer: 52 mL/min — ABNORMAL LOW (ref >60)
GFR calc non Af Amer: 45 mL/min — ABNORMAL LOW (ref >60)
Glucose, Bld: 120 mg/dL — ABNORMAL HIGH (ref 70–99)
Potassium: 3.7 mmol/L (ref 3.5–5.1)
Sodium: 137 mmol/L (ref 135–145)
Total Bilirubin: 1.1 mg/dL (ref 0.3–1.2)
Total Protein: 6.6 g/dL (ref 6.5–8.1)

## 2019-05-05 LAB — CBC WITH DIFFERENTIAL/PLATELET
Abs Immature Granulocytes: 0.02 10*3/uL (ref 0.00–0.07)
Basophils Absolute: 0 10*3/uL (ref 0.0–0.1)
Basophils Relative: 1 %
Eosinophils Absolute: 0.1 10*3/uL (ref 0.0–0.5)
Eosinophils Relative: 1 %
HCT: 43.8 % (ref 36.0–46.0)
Hemoglobin: 15.6 g/dL — ABNORMAL HIGH (ref 12.0–15.0)
Immature Granulocytes: 0 %
Lymphocytes Relative: 14 %
Lymphs Abs: 1 10*3/uL (ref 0.7–4.0)
MCH: 32.3 pg (ref 26.0–34.0)
MCHC: 35.6 g/dL (ref 30.0–36.0)
MCV: 90.7 fL (ref 80.0–100.0)
Monocytes Absolute: 0.5 10*3/uL (ref 0.1–1.0)
Monocytes Relative: 7 %
Neutro Abs: 5.2 10*3/uL (ref 1.7–7.7)
Neutrophils Relative %: 77 %
Platelets: 172 10*3/uL (ref 150–400)
RBC: 4.83 MIL/uL (ref 3.87–5.11)
RDW: 12.3 % (ref 11.5–15.5)
WBC: 6.8 10*3/uL (ref 4.0–10.5)
nRBC: 0 % (ref 0.0–0.2)

## 2019-05-05 LAB — TROPONIN I (HIGH SENSITIVITY): Troponin I (High Sensitivity): 46 ng/L — ABNORMAL HIGH (ref <18)

## 2019-05-05 LAB — BRAIN NATRIURETIC PEPTIDE: B Natriuretic Peptide: 1069.2 pg/mL — ABNORMAL HIGH (ref 0.0–100.0)

## 2019-05-05 LAB — MAGNESIUM: Magnesium: 1.9 mg/dL (ref 1.7–2.4)

## 2019-05-05 LAB — LIPASE, BLOOD: Lipase: 32 U/L (ref 11–51)

## 2019-05-05 MED ORDER — LORAZEPAM 1 MG PO TABS
0.5000 mg | ORAL_TABLET | Freq: Once | ORAL | Status: AC
  Administered 2019-05-05: 0.5 mg via SUBLINGUAL
  Filled 2019-05-05: qty 1

## 2019-05-05 MED ORDER — ASPIRIN 81 MG PO CHEW
324.0000 mg | CHEWABLE_TABLET | Freq: Once | ORAL | Status: AC
  Administered 2019-05-05: 324 mg via ORAL
  Filled 2019-05-05: qty 4

## 2019-05-05 NOTE — H&P (Signed)
History and Physical    Cheryl Vargas HUD:[id number] DOB: 1935/09/09 DOA: 05/05/2019  PCP: Lavone Orn, MD   Patient coming from: Home   Chief Complaint: SOB, diaphoresis, and near-syncope   HPI: Cheryl Vargas is a 84 y.o. female with medical history significant for paroxysmal atrial fibrillation on Xarelto, chronic kidney disease stage III, and nonischemic cardiomyopathy with EF 25 to 30% in April 2019, now presenting to the emergency department for evaluation of dyspnea, diaphoresis, and near syncope.  The patient reports occasional episode of lightheadedness, dyspnea, and diaphoresis going back a few months but now with increasingly frequent episodes.  Patient had an episode a few months ago where she became acutely lightheaded, diaphoretic, and dyspneic when she bent over to pick up a box.  Episode resolved within a few minutes and she returned to her usual state.  She is gone on to have occasional episodes, but then had 3 of these today which was unusual and she continued to experience some nausea between the episodes.  She denies any recent weight change, denies any increased dyspnea, has not had chest pain, and denies any cough, fevers, or chills.  ED Course: Upon arrival to the ED, patient is found to be saturating mid-90's on room air with normal HR, RR, and BP. EKG features atrial flutter with rate 83. CXR is notable for cardiomegaly with scar or atelectasis at bases. BNP is elevated to 1069 and HS troponin elevated to 46. Chemistry panel features a creatinine of 1.15, similar to priors. CBC is unremarkable. ED physician discussed with cardiologist who said that they would consult on the patient and hospitalists are asked to admit.   Review of Systems:  All other systems reviewed and apart from HPI, are negative.  Past Medical History:  Diagnosis Date  . Arthritis   . Chronic combined systolic (congestive) and diastolic (congestive) heart failure ([place])   . Mitral regurgitation    . Non-ischemic cardiomyopathy ([place])   . Persistent atrial fibrillation   . Ventricular tachycardia [facility])     Past Surgical History:  Procedure Laterality Date  . AV NODE ABLATION N/A 09/07/2017   Procedure: AV NODE ABLATION;  Surgeon: [name], MD;  Location: [place] [facility];  Service: Cardiovascular;  Laterality: N/A;  . BIV ICD INSERTION CRT-D N/A 09/07/2017   Procedure: BIV ICD INSERTION CRT-D;  Surgeon: [name], MD;  Location: [place] [facility];  Service: Cardiovascular;  Laterality: N/A;  . BUNIONECTOMY    . CARDIAC CATHETERIZATION     in 2004 at [facility]. "Insignificant blockage" per patient  . CARDIAC CATHETERIZATION N/A 04/25/2016   Procedure: Left Heart Cath and Coronary Angiography;  Surgeon: Nelva Bush, MD;  Location: [place] [facility];  Service: Cardiovascular;  Laterality: N/A;  . CARDIOVERSION N/A 06/29/2017   Procedure: CARDIOVERSION;  Surgeon: Larey Dresser, MD;  Location: [facility] ENDOSCOPY;  Service: Cardiovascular;  Laterality: N/A;  . CARDIOVERSION N/A 08/28/2017   Procedure: CARDIOVERSION;  Surgeon: Larey Dresser, MD;  Location: [facility] ENDOSCOPY;  Service: Cardiovascular;  Laterality: N/A;  . SHOULDER SURGERY     closed reduction  . TEE WITHOUT CARDIOVERSION N/A 06/29/2017   Procedure: TRANSESOPHAGEAL ECHOCARDIOGRAM (TEE);  Surgeon: Larey Dresser, MD;  Location: [facility] ENDOSCOPY;  Service: Cardiovascular;  Laterality: N/A;  . TONSILLECTOMY    . TOTAL HIP ARTHROPLASTY Left 03/18/2013   Procedure: TOTAL HIP ARTHROPLASTY ANTERIOR APPROACH;  Surgeon: Mauri Pole, MD;  Location: [facility] ORS;  Service: Orthopedics;  Laterality: Left;  .  TUBAL LIGATION       reports that she has never smoked. She has never used smokeless tobacco. She reports current alcohol use of about 3.0 standard drinks of alcohol per week. She reports that she does not use drugs.  Allergies  Allergen Reactions  . Sotalol Other (See Comments)    Prolonged QTc  . Alendronate Sodium  Other (See Comments)    Arm pain  . Amiodarone Nausea Only  . Ciprofloxacin Other (See Comments)    Not effective  . Compazine [Prochlorperazine]   . Coreg [Carvedilol] Other (See Comments)    Fatigue   . Metoprolol Other (See Comments)    Profound fatigue    Family History  Problem Relation Age of Onset  . Lung cancer Mother   . Heart attack Father      Prior to Admission medications   Medication Sig Start Date End Date Taking? Authorizing Provider  acetaminophen (TYLENOL) 500 MG tablet Take 500 mg by mouth every 6 (six) hours as needed for headache.   Yes [provider]  bismuth subsalicylate (PEPTO BISMOL) 262 MG/15ML suspension Take 15 mLs by mouth every 6 (six) hours as needed for indigestion.   Yes [provider]  bisoprolol (ZEBETA) 5 MG tablet TAKE 1/2 TABLET BY MOUTH EVERY MORNING AND TAKE 1 TABLET BY MOUTH EVERY EVENING Patient taking differently: Take 2.5 mg by mouth daily.  04/30/19  Yes Larey Dresser, MD  docusate sodium (COLACE) 100 MG capsule Take 100 mg by mouth daily as needed for mild constipation.   Yes [provider]  fluticasone (FLONASE) 50 MCG/ACT nasal spray Place 1 spray into both nostrils daily as needed for allergies or rhinitis.    Yes [provider]  furosemide (LASIX) 20 MG tablet Take 1 tablet (20 mg total) by mouth daily. 02/11/19  Yes Larey Dresser, MD  losartan (COZAAR) 25 MG tablet Take 0.5 tablets (12.5 mg total) by mouth at bedtime. 04/18/18  Yes Larey Dresser, MD  spironolactone (ALDACTONE) 25 MG tablet Take 1 tablet (25 mg total) by mouth daily. Patient taking differently: Take 12.5 mg by mouth every evening.  03/07/19  Yes Larey Dresser, MD  XARELTO 15 MG TABS tablet TAKE 1 TABLET BY MOUTH DAILY WITH SUPPER( RESUME ON JAN 2) Patient taking differently: Take 15 mg by mouth daily with supper.  10/25/18  Yes Larey Dresser, MD  zolpidem (AMBIEN) 5 MG tablet Take 1 tablet (5 mg total) by mouth at  bedtime. 09/20/17  Yes Mariel Aloe, MD    Physical Exam: Vitals:   05/05/19 2215 05/05/19 2230 05/05/19 2245 05/05/19 2300  BP: 125/84 (!) 133/92 105/68 118/83  Pulse: 79 80 80 79  Resp: 15 (!) 21 17 19   SpO2: 97% 96% 93% 94%    Constitutional: NAD, calm  Eyes: PERTLA, lids and conjunctivae normal ENMT: Mucous membranes are moist. Posterior pharynx clear of any exudate or lesions.   Neck: normal, supple, no masses, no thyromegaly Respiratory: no wheezing, no crackles. Normal respiratory effort. No accessory muscle use.  Cardiovascular: Rate ~80 and irregularly irregular. No extremity edema.   Abdomen: No distension, no tenderness, soft. Bowel sounds normal.  Musculoskeletal: no clubbing / cyanosis. No joint deformity upper and lower extremities.    Skin: no significant rashes, lesions, ulcers. Warm, dry, well-perfused. Neurologic: No facial asymmetry. Sensation intact. Moving all extremities.  Psychiatric: Alert and oriented x 3. Very pleasant, cooperative.    Labs on Admission: I have personally  reviewed following labs and imaging studies  CBC: Recent Labs  Lab 05/05/19 2052  WBC 6.8  NEUTROABS 5.2  HGB 15.6*  HCT 43.8  MCV 90.7  PLT 379   Basic Metabolic Panel: Recent Labs  Lab 05/05/19 2052  NA 137  K 3.7  CL 105  CO2 19*  GLUCOSE 120*  BUN 24*  CREATININE 1.15*  CALCIUM 9.1  MG 1.9   GFR: CrCl cannot be calculated (Unknown ideal weight.). Liver Function Tests: Recent Labs  Lab 05/05/19 2052  AST 27  ALT 19  ALKPHOS 55  BILITOT 1.1  PROT 6.6  ALBUMIN 3.6   Recent Labs  Lab 05/05/19 2052  LIPASE 32   No results for input(s): AMMONIA in the last 168 hours. Coagulation Profile: No results for input(s): INR, PROTIME in the last 168 hours. Cardiac Enzymes: No results for input(s): CKTOTAL, CKMB, CKMBINDEX, TROPONINI in the last 168 hours. BNP (last 3 results) No results for input(s): PROBNP in the last 8760 hours. HbA1C: No results for  input(s): HGBA1C in the last 72 hours. CBG: No results for input(s): GLUCAP in the last 168 hours. Lipid Profile: No results for input(s): CHOL, HDL, LDLCALC, TRIG, CHOLHDL, LDLDIRECT in the last 72 hours. Thyroid Function Tests: No results for input(s): TSH, T4TOTAL, FREET4, T3FREE, THYROIDAB in the last 72 hours. Anemia Panel: No results for input(s): VITAMINB12, FOLATE, FERRITIN, TIBC, IRON, RETICCTPCT in the last 72 hours. Urine analysis:    Component Value Date/Time   COLORURINE AMBER (A) 01/21/2018 1948   APPEARANCEUR HAZY (A) 01/21/2018 1948   LABSPEC 1.009 01/21/2018 1948   PHURINE 7.0 01/21/2018 1948   GLUCOSEU NEGATIVE 01/21/2018 1948   HGBUR SMALL (A) 01/21/2018 1948   BILIRUBINUR NEGATIVE 01/21/2018 1948   KETONESUR NEGATIVE 01/21/2018 1948   PROTEINUR NEGATIVE 01/21/2018 1948   UROBILINOGEN 0.2 03/17/2013 1423   NITRITE NEGATIVE 01/21/2018 1948   LEUKOCYTESUR NEGATIVE 01/21/2018 1948   Sepsis Labs: @LABRCNTIP (procalcitonin:4,lacticidven:4) )No results found for this or any previous visit (from the past 240 hour(s)).   Radiological Exams on Admission: Dg Chest Port 1 View  Result Date: 05/05/2019 CLINICAL DATA:  Shortness of breath EXAM: PORTABLE CHEST 1 VIEW COMPARISON:  08/26/2018 FINDINGS: Left-sided pacing device as before. Enlarged cardiomediastinal silhouette with aortic atherosclerosis. Mild bibasilar atelectasis or scarring. No pneumothorax. IMPRESSION: Cardiomegaly with patchy scarring or atelectasis at the bases. Electronically Signed   By: Donavan Foil M.D.   On: 05/05/2019 21:14    EKG: Independently reviewed. Atrial flutter, rate 83.   Assessment/Plan   1. Near-syncope  - Presents with recurrent episodes of near-syncope with diaphoresis and dyspnea, becoming more frequent  - Continue cardiac monitoring, interrogate device, check orthostatics, update echocardiogram    2. Non-ischemic cardiomyopathy  - Appears compensated, patient reports that  weight is stable and she denies edema  - Continue Lasix, Aldactone, beta-blocker, ARB, daily weights   3. Paroxysmal atrial fibrillation  - In rate-controlled a fib in ED  - CHADS-VASc is 27 (age x2, CHF, gender) - Continue Xarelto and bisoprolol    4. CKD stage III  - SCr is 1.15 in ED, similar to priors  - Renally-dose medications, monitor     PPE: Mask, face shield  DVT prophylaxis: Xarelto  Code Status: Full  Family Communication: Discussed with patient  Consults called: Cardiology consulted by ED physician  Admission status: Observation     Vianne Bulls, MD [facility] Pager [phone]  If 7PM-7AM, please contact night-coverage [email or web address] Password [facility]  05/05/2019,  11:27 PM

## 2019-05-05 NOTE — ED Notes (Signed)
RN interrogated [employer] pacemaker per order.    Report per representative: Pt has chronic AFIB, pacing in the ventricles only. No unusual events in the past two weeks, many episodes of heart rate in 150, with the last being August 14th.   The settings indicate she might be in the beginning stages of fluid overload.   Device is working properly.    Report given to provider.

## 2019-05-05 NOTE — ED Provider Notes (Signed)
[facility] EMERGENCY DEPARTMENT Provider Note   CSN: [id number] Arrival date & time: 05/05/19  2019     History   Chief Complaint Chief Complaint  Patient presents with  . Shortness of Breath    HPI Cheryl Vargas is a 84 y.o. female with a past medical history of non-ischemic cardiomyopathy, a fib on anticoagulation, HTN, s/p biventricular pacemaker/ICD who presents to the emergency department with several episodes of shortness of breath, near syncope, and diaphoresis. Patient reports having 3 episodes of these symptoms that occurred today while grocery shopping. Patient also reports associated nausea and epigastric pain. Patient reports it feels similar to when she had a cardiac arrest in the past.     Illness Severity:  Severe Onset quality:  Sudden Timing:  Intermittent Progression:  Worsening Chronicity:  Recurrent Context:  With exertion Relieved by:  Rest Worsened by:  Activity Associated symptoms: abdominal pain (epigastric), nausea and shortness of breath   Associated symptoms: no chest pain, no congestion, no cough, no diarrhea, no fever, no headaches, no loss of consciousness, no rash, no vomiting and no wheezing   Abdominal pain:    Location:  Epigastric   Quality: aching     Quality: not sharp   Nausea:    Timing:  Intermittent   Progression:  Waxing and waning Shortness of breath:    Onset quality:  Sudden   Timing:  Intermittent Risk factors:  CHF (EF 20-25%)   Past Medical History:  Diagnosis Date  . Arthritis   . Chronic combined systolic (congestive) and diastolic (congestive) heart failure ([place])   . Mitral regurgitation   . Non-ischemic cardiomyopathy ([place])   . Persistent atrial fibrillation   . Ventricular tachycardia [facility])     Patient Active Problem List   Diagnosis Date Noted  . Near syncope 05/05/2019  . Hypercholesterolemia without hypertriglyceridemia 02/13/2018  . Syncope 01/21/2018  . Constipation 01/21/2018  .  CHF exacerbation ([place]) 12/18/2017  . CKD (chronic kidney disease), stage III ([place]) 12/16/2017  . SOB (shortness of breath)   . Abdominal pain 09/18/2017  . CHF (congestive heart failure) ([place]) 09/17/2017  . Pulmonary edema   . Cardiac device in situ   . Ventricular tachycardia ([place])   . Cardiac arrest ([place])   . Systolic and diastolic CHF, acute ([place])   . Severe mitral valve regurgitation   . A-fib ([place]) 09/03/2017  . Mitral regurgitation   . Acute on chronic systolic heart failure ([place])   . Non-ischemic cardiomyopathy ([place])   . CHF (congestive heart failure), NYHA class IV ([place]) 04/24/2016  . Dizziness 12/01/2015  . Hypertension 12/01/2015  . Insomnia 12/01/2015  . Paroxysmal atrial fibrillation ([facility])   . Cardiac arrhythmia 10/20/2014  . Systolic dysfunction 61/44/3154  . Hypoventilation, idiopathic 10/20/2014  . Other fatigue 10/20/2014  . Closed left hip fracture ([place]) 03/17/2013  . Cardiomegaly 03/17/2013  . Frequent PVCs 03/17/2013  . Hyperlipidemia 03/17/2013    Past Surgical History:  Procedure Laterality Date  . AV NODE ABLATION N/A 09/07/2017   Procedure: AV NODE ABLATION;  Surgeon: [name], MD;  Location: [place] CV LAB;  Service: Cardiovascular;  Laterality: N/A;  . BIV ICD INSERTION CRT-D N/A 09/07/2017   Procedure: BIV ICD INSERTION CRT-D;  Surgeon: [name], MD;  Location: [place] CV LAB;  Service: Cardiovascular;  Laterality: N/A;  . BUNIONECTOMY    . CARDIAC CATHETERIZATION     in 2004 at [facility]. "Insignificant blockage" per patient  . CARDIAC CATHETERIZATION  N/A 04/25/2016   Procedure: Left Heart Cath and Coronary Angiography;  Surgeon: Nelva Bush, MD;  Location: [place] CV LAB;  Service: Cardiovascular;  Laterality: N/A;  . CARDIOVERSION N/A 06/29/2017   Procedure: CARDIOVERSION;  Surgeon: Larey Dresser, MD;  Location: [facility] ENDOSCOPY;  Service: Cardiovascular;  Laterality: N/A;  . CARDIOVERSION N/A 08/28/2017   Procedure:  CARDIOVERSION;  Surgeon: Larey Dresser, MD;  Location: [facility] ENDOSCOPY;  Service: Cardiovascular;  Laterality: N/A;  . SHOULDER SURGERY     closed reduction  . TEE WITHOUT CARDIOVERSION N/A 06/29/2017   Procedure: TRANSESOPHAGEAL ECHOCARDIOGRAM (TEE);  Surgeon: Larey Dresser, MD;  Location: [facility] ENDOSCOPY;  Service: Cardiovascular;  Laterality: N/A;  . TONSILLECTOMY    . TOTAL HIP ARTHROPLASTY Left 03/18/2013   Procedure: TOTAL HIP ARTHROPLASTY ANTERIOR APPROACH;  Surgeon: Mauri Pole, MD;  Location: [facility] ORS;  Service: Orthopedics;  Laterality: Left;  . TUBAL LIGATION       OB History   No obstetric history on file.      Home Medications    Prior to Admission medications   Medication Sig Start Date End Date Taking? Authorizing Provider  acetaminophen (TYLENOL) 500 MG tablet Take 500 mg by mouth every 6 (six) hours as needed for headache.   Yes [provider]  bismuth subsalicylate (PEPTO BISMOL) 262 MG/15ML suspension Take 15 mLs by mouth every 6 (six) hours as needed for indigestion.   Yes [provider]  bisoprolol (ZEBETA) 5 MG tablet TAKE 1/2 TABLET BY MOUTH EVERY MORNING AND TAKE 1 TABLET BY MOUTH EVERY EVENING Patient taking differently: Take 2.5 mg by mouth daily.  04/30/19  Yes Larey Dresser, MD  docusate sodium (COLACE) 100 MG capsule Take 100 mg by mouth daily as needed for mild constipation.   Yes [provider]  fluticasone (FLONASE) 50 MCG/ACT nasal spray Place 1 spray into both nostrils daily as needed for allergies or rhinitis.    Yes [provider]  furosemide (LASIX) 20 MG tablet Take 1 tablet (20 mg total) by mouth daily. 02/11/19  Yes Larey Dresser, MD  losartan (COZAAR) 25 MG tablet Take 0.5 tablets (12.5 mg total) by mouth at bedtime. 04/18/18  Yes Larey Dresser, MD  spironolactone (ALDACTONE) 25 MG tablet Take 1 tablet (25 mg total) by mouth daily. Patient taking differently: Take 12.5 mg by mouth every evening.  03/07/19   Yes Larey Dresser, MD  XARELTO 15 MG TABS tablet TAKE 1 TABLET BY MOUTH DAILY WITH SUPPER( RESUME ON JAN 2) Patient taking differently: Take 15 mg by mouth daily with supper.  10/25/18  Yes Larey Dresser, MD  zolpidem (AMBIEN) 5 MG tablet Take 1 tablet (5 mg total) by mouth at bedtime. 09/20/17  Yes Mariel Aloe, MD    Family History Family History  Problem Relation Age of Onset  . Lung cancer Mother   . Heart attack Father     Social History Social History   Tobacco Use  . Smoking status: Never Smoker  . Smokeless tobacco: Never Used  Substance Use Topics  . Alcohol use: Yes    Alcohol/week: 3.0 standard drinks    Types: 3 Glasses of wine per week    Comment: per week  . Drug use: No     Allergies   Sotalol, Alendronate sodium, Amiodarone, Ciprofloxacin, Compazine [prochlorperazine], Coreg [carvedilol], and Metoprolol   Review of Systems Review of Systems  Constitutional: Negative for fever.  HENT: Negative for congestion and  trouble swallowing.   Eyes: Negative for visual disturbance.  Respiratory: Positive for shortness of breath. Negative for cough and wheezing.   Cardiovascular: Negative for chest pain.  Gastrointestinal: Positive for abdominal pain (epigastric) and nausea. Negative for abdominal distention, constipation, diarrhea and vomiting.  Genitourinary: Negative for dysuria.  Musculoskeletal: Negative for gait problem.  Skin: Negative for rash.  Neurological: Positive for light-headedness. Negative for seizures, loss of consciousness, syncope, facial asymmetry, weakness, numbness and headaches.  Psychiatric/Behavioral: Negative for confusion.     Physical Exam Updated Vital Signs BP 116/77   Pulse 82   Resp 20   SpO2 94%   Physical Exam Constitutional:      General: She is not in acute distress.    Appearance: She is not diaphoretic.  HENT:     Head: Normocephalic and atraumatic.     Right Ear: External ear normal.     Left Ear:  External ear normal.     Nose: Nose normal.     Mouth/Throat:     Mouth: Mucous membranes are moist.     Pharynx: Oropharynx is clear.  Eyes:     Conjunctiva/sclera: Conjunctivae normal.     Pupils: Pupils are equal, round, and reactive to light.  Neck:     Musculoskeletal: Neck supple.  Cardiovascular:     Rate and Rhythm: Normal rate. Rhythm irregular.     Pulses: Normal pulses.     Comments: Cardiac device in place in left side of chest. Pulmonary:     Effort: Pulmonary effort is normal. No respiratory distress.     Breath sounds: No wheezing, rhonchi or rales.  Chest:     Chest wall: No tenderness.  Abdominal:     General: There is no distension.     Palpations: Abdomen is soft.     Tenderness: There is abdominal tenderness (epigastric). There is no guarding or rebound.  Musculoskeletal:     Right lower leg: No edema.     Left lower leg: No edema.  Skin:    General: Skin is warm and dry.  Neurological:     General: No focal deficit present.     Mental Status: She is alert and oriented to person, place, and time.     Cranial Nerves: No cranial nerve deficit.     Sensory: No sensory deficit.     Motor: No weakness.     Coordination: Coordination normal.      ED Treatments / Results  Labs (all labs ordered are listed, but only abnormal results are displayed) Labs Reviewed  COMPREHENSIVE METABOLIC PANEL - Abnormal; Notable for the following components:      Result Value   CO2 19 (*)    Glucose, Bld 120 (*)    BUN 24 (*)    Creatinine, Ser 1.15 (*)    GFR calc non Af Amer 45 (*)    GFR calc Af Amer 52 (*)    All other components within normal limits  BRAIN NATRIURETIC PEPTIDE - Abnormal; Notable for the following components:   B Natriuretic Peptide 1,069.2 (*)    All other components within normal limits  CBC WITH DIFFERENTIAL/PLATELET - Abnormal; Notable for the following components:   Hemoglobin 15.6 (*)    All other components within normal limits  TROPONIN  I (HIGH SENSITIVITY) - Abnormal; Notable for the following components:   Troponin I (High Sensitivity) 46 (*)    All other components within normal limits  TROPONIN I (HIGH SENSITIVITY) - Abnormal; Notable for  the following components:   Troponin I (High Sensitivity) 57 (*)    All other components within normal limits  SARS CORONAVIRUS 2 (TAT 6-12 HRS)  LIPASE, BLOOD  MAGNESIUM  BASIC METABOLIC PANEL  CBC    EKG EKG Interpretation  Date/Time:  Monday May 05 2019 20:34:38 EDT Ventricular Rate:  83 PR Interval:    QRS Duration: 172 QT Interval:  472 QTC Calculation: 555 R Axis:   157 Text Interpretation:  Right and left arm electrode reversal, interpretation assumes no reversal Atrial fibrillation Ventricular premature complex Nonspecific intraventricular conduction delay Anterolateral infarct, old paced rhythm with flutter type pattern seen in v1 only Confirmed by Varney Biles [phone]) on 05/05/2019 9:04:38 PM   Radiology Dg Chest Port 1 View  Result Date: 05/05/2019 CLINICAL DATA:  Shortness of breath EXAM: PORTABLE CHEST 1 VIEW COMPARISON:  08/26/2018 FINDINGS: Left-sided pacing device as before. Enlarged cardiomediastinal silhouette with aortic atherosclerosis. Mild bibasilar atelectasis or scarring. No pneumothorax. IMPRESSION: Cardiomegaly with patchy scarring or atelectasis at the bases. Electronically Signed   By: Donavan Foil M.D.   On: 05/05/2019 21:14    Procedures Procedures (including critical care time)  Medications Ordered in ED Medications  bisoprolol (ZEBETA) tablet 2.5 mg (has no administration in time range)  furosemide (LASIX) tablet 20 mg (has no administration in time range)  losartan (COZAAR) tablet 12.5 mg (has no administration in time range)  spironolactone (ALDACTONE) tablet 12.5 mg (has no administration in time range)  zolpidem (AMBIEN) tablet 5 mg (5 mg Oral Given 05/06/19 5462)  bismuth subsalicylate (PEPTO BISMOL) 262 MG/15ML suspension 15  mL (has no administration in time range)  docusate sodium (COLACE) capsule 100 mg (has no administration in time range)  Rivaroxaban (XARELTO) tablet 15 mg (15 mg Oral Refused 05/06/19 0154)  sodium chloride flush (NS) 0.9 % injection 3 mL (has no administration in time range)  sodium chloride flush (NS) 0.9 % injection 3 mL (has no administration in time range)  sodium chloride flush (NS) 0.9 % injection 3 mL (has no administration in time range)  0.9 %  sodium chloride infusion (has no administration in time range)  acetaminophen (TYLENOL) tablet 650 mg (has no administration in time range)    Or  acetaminophen (TYLENOL) suppository 650 mg (has no administration in time range)  HYDROcodone-acetaminophen (NORCO/VICODIN) 5-325 MG per tablet 1 tablet (has no administration in time range)  ondansetron (ZOFRAN) tablet 4 mg (has no administration in time range)    Or  ondansetron (ZOFRAN) injection 4 mg (has no administration in time range)  aspirin chewable tablet 324 mg (324 mg Oral Given 05/05/19 2123)  LORazepam (ATIVAN) tablet 0.5 mg (0.5 mg Sublingual Given 05/05/19 2126)     Initial Impression / Assessment and Plan / ED Course  I have reviewed the triage vital signs and the nursing notes.  Pertinent labs & imaging results that were available during my care of the patient were reviewed by me and considered in my medical decision making (see chart for details).        Concern for shortness of breath and epigastric pain as anginal equivalent. Also, concern for arrhythmia. [employer] pacemake interrogated which showed chronic atrial fibrillation with ventricular pacing, no events in the last 2 weeks but several episodes of HR in the 150s most recently on August 14th. Chest xray with cardiomegaly with patchy scarring vs atelectasis in bases. Patient given ASA 325 mg. Patient given sublingual ativan for nausea. Low suspicion for PE as patient reports  compliance with anticoagulation and shortness  of breath is episodic. Patient is not fluid overloaded on exam, BNP near baseline. Initial troponin mildly elevated at 46. Discussed with Cardiology who will evaluate patient. Patient admitted to Hospitalist service for high risk pre-syncope and ACS rule out.  Patient seen and plan discussed with Dr. Kathrynn Humble.    Final Clinical Impressions(s) / ED Diagnoses   Final diagnoses:  Near syncope  Shortness of breath    ED Discharge Orders    None       Aalayah Riles, Missy Sabins, MD 05/06/19 Greggory Keen    Varney Biles, MD 05/06/19 [phone]

## 2019-05-05 NOTE — ED Triage Notes (Signed)
Pt BIB [facility] from home, c/o shortness of breath with exertion and diaphoresis x 2-3 hours ago. Diaphoresis has resolved, mild shortness of breath continues. Also c/o nausea. Hx CHF and pacemaker.

## 2019-05-06 ENCOUNTER — Observation Stay (HOSPITAL_BASED_OUTPATIENT_CLINIC_OR_DEPARTMENT_OTHER): Payer: PPO

## 2019-05-06 DIAGNOSIS — R55 Syncope and collapse: Secondary | ICD-10-CM | POA: Diagnosis not present

## 2019-05-06 DIAGNOSIS — I340 Nonrheumatic mitral (valve) insufficiency: Secondary | ICD-10-CM

## 2019-05-06 DIAGNOSIS — I361 Nonrheumatic tricuspid (valve) insufficiency: Secondary | ICD-10-CM

## 2019-05-06 DIAGNOSIS — I428 Other cardiomyopathies: Secondary | ICD-10-CM | POA: Diagnosis not present

## 2019-05-06 LAB — CBC
HCT: 41.3 % (ref 36.0–46.0)
Hemoglobin: 14.4 g/dL (ref 12.0–15.0)
MCH: 32.3 pg (ref 26.0–34.0)
MCHC: 34.9 g/dL (ref 30.0–36.0)
MCV: 92.6 fL (ref 80.0–100.0)
Platelets: 155 10*3/uL (ref 150–400)
RBC: 4.46 MIL/uL (ref 3.87–5.11)
RDW: 12.5 % (ref 11.5–15.5)
WBC: 7.1 10*3/uL (ref 4.0–10.5)
nRBC: 0 % (ref 0.0–0.2)

## 2019-05-06 LAB — SARS CORONAVIRUS 2 (TAT 6-24 HRS): SARS Coronavirus 2: NEGATIVE

## 2019-05-06 LAB — BASIC METABOLIC PANEL
Anion gap: 6 (ref 5–15)
BUN: 23 mg/dL (ref 8–23)
CO2: 24 mmol/L (ref 22–32)
Calcium: 8.7 mg/dL — ABNORMAL LOW (ref 8.9–10.3)
Chloride: 108 mmol/L (ref 98–111)
Creatinine, Ser: 1.15 mg/dL — ABNORMAL HIGH (ref 0.44–1.00)
GFR calc Af Amer: 52 mL/min — ABNORMAL LOW (ref >60)
GFR calc non Af Amer: 45 mL/min — ABNORMAL LOW (ref >60)
Glucose, Bld: 127 mg/dL — ABNORMAL HIGH (ref 70–99)
Potassium: 3.7 mmol/L (ref 3.5–5.1)
Sodium: 138 mmol/L (ref 135–145)

## 2019-05-06 LAB — ECHOCARDIOGRAM COMPLETE
Height: 65 in
Weight: 2241.6 oz

## 2019-05-06 LAB — TROPONIN I (HIGH SENSITIVITY): Troponin I (High Sensitivity): 57 ng/L — ABNORMAL HIGH (ref <18)

## 2019-05-06 MED ORDER — MAGNESIUM SULFATE IN D5W 1-5 GM/100ML-% IV SOLN
1.0000 g | Freq: Once | INTRAVENOUS | Status: AC
  Administered 2019-05-06: 12:00:00 1 g via INTRAVENOUS
  Filled 2019-05-06: qty 100

## 2019-05-06 MED ORDER — BISOPROLOL FUMARATE 5 MG PO TABS
2.5000 mg | ORAL_TABLET | Freq: Every day | ORAL | Status: DC
  Administered 2019-05-06: 2.5 mg via ORAL
  Filled 2019-05-06: qty 1

## 2019-05-06 MED ORDER — SODIUM CHLORIDE 0.9% FLUSH
3.0000 mL | Freq: Two times a day (BID) | INTRAVENOUS | Status: DC
  Administered 2019-05-06 (×2): 3 mL via INTRAVENOUS

## 2019-05-06 MED ORDER — BISMUTH SUBSALICYLATE 262 MG/15ML PO SUSP: 15.0000 mL | Freq: Four times a day (QID) | ORAL | Status: DC | PRN

## 2019-05-06 MED ORDER — SODIUM CHLORIDE 0.9 % IV SOLN: 250.0000 mL | INTRAVENOUS | Status: DC | PRN

## 2019-05-06 MED ORDER — ACETAMINOPHEN 650 MG RE SUPP: 650.0000 mg | Freq: Four times a day (QID) | RECTAL | Status: DC | PRN

## 2019-05-06 MED ORDER — ONDANSETRON HCL 4 MG/2ML IJ SOLN: 4.0000 mg | Freq: Four times a day (QID) | INTRAMUSCULAR | Status: DC | PRN

## 2019-05-06 MED ORDER — SODIUM CHLORIDE 0.9% FLUSH: 3.0000 mL | INTRAVENOUS | Status: DC | PRN

## 2019-05-06 MED ORDER — ZOLPIDEM TARTRATE 5 MG PO TABS
5.0000 mg | ORAL_TABLET | Freq: Every day | ORAL | Status: DC
  Administered 2019-05-06 (×2): 5 mg via ORAL
  Filled 2019-05-06 (×2): qty 1

## 2019-05-06 MED ORDER — HYDROCODONE-ACETAMINOPHEN 5-325 MG PO TABS: 1.0000 | ORAL_TABLET | ORAL | Status: DC | PRN

## 2019-05-06 MED ORDER — LOSARTAN POTASSIUM 25 MG PO TABS
12.5000 mg | ORAL_TABLET | Freq: Every day | ORAL | Status: DC
  Administered 2019-05-06: 12.5 mg via ORAL
  Filled 2019-05-06: qty 1

## 2019-05-06 MED ORDER — FAMOTIDINE 20 MG PO TABS
20.0000 mg | ORAL_TABLET | Freq: Every day | ORAL | Status: DC
  Administered 2019-05-06: 20 mg via ORAL
  Filled 2019-05-06: qty 1

## 2019-05-06 MED ORDER — FUROSEMIDE 20 MG PO TABS
20.0000 mg | ORAL_TABLET | Freq: Every day | ORAL | Status: DC
  Administered 2019-05-07: 11:00:00 20 mg via ORAL
  Filled 2019-05-06: qty 1

## 2019-05-06 MED ORDER — ALUM & MAG HYDROXIDE-SIMETH 200-200-20 MG/5ML PO SUSP
30.0000 mL | Freq: Once | ORAL | Status: AC
  Administered 2019-05-06: 19:00:00 30 mL via ORAL
  Filled 2019-05-06: qty 30

## 2019-05-06 MED ORDER — DOCUSATE SODIUM 100 MG PO CAPS: 100.0000 mg | ORAL_CAPSULE | Freq: Every day | ORAL | Status: DC | PRN

## 2019-05-06 MED ORDER — BISOPROLOL FUMARATE 5 MG PO TABS
2.5000 mg | ORAL_TABLET | Freq: Two times a day (BID) | ORAL | Status: DC
  Administered 2019-05-06 – 2019-05-07 (×2): 2.5 mg via ORAL
  Filled 2019-05-06 (×2): qty 1

## 2019-05-06 MED ORDER — ONDANSETRON HCL 4 MG PO TABS: 4.0000 mg | ORAL_TABLET | Freq: Four times a day (QID) | ORAL | Status: DC | PRN

## 2019-05-06 MED ORDER — RIVAROXABAN 15 MG PO TABS
15.0000 mg | ORAL_TABLET | Freq: Every day | ORAL | Status: DC
  Administered 2019-05-06: 15 mg via ORAL
  Filled 2019-05-06 (×2): qty 1

## 2019-05-06 MED ORDER — FUROSEMIDE 20 MG PO TABS
20.0000 mg | ORAL_TABLET | Freq: Every day | ORAL | Status: DC
  Administered 2019-05-06: 20 mg via ORAL
  Filled 2019-05-06: qty 1

## 2019-05-06 MED ORDER — SODIUM CHLORIDE 0.9% FLUSH: 3.0000 mL | Freq: Two times a day (BID) | INTRAVENOUS | Status: DC

## 2019-05-06 MED ORDER — SPIRONOLACTONE 12.5 MG HALF TABLET
12.5000 mg | ORAL_TABLET | Freq: Every evening | ORAL | Status: DC
  Administered 2019-05-06: 12.5 mg via ORAL
  Filled 2019-05-06: qty 1

## 2019-05-06 MED ORDER — ACETAMINOPHEN 325 MG PO TABS: 650.0000 mg | ORAL_TABLET | Freq: Four times a day (QID) | ORAL | Status: DC | PRN

## 2019-05-06 MED ORDER — POTASSIUM CHLORIDE CRYS ER 20 MEQ PO TBCR
20.0000 meq | EXTENDED_RELEASE_TABLET | Freq: Once | ORAL | Status: AC
  Administered 2019-05-06: 11:00:00 20 meq via ORAL
  Filled 2019-05-06: qty 1

## 2019-05-06 MED ORDER — FAMOTIDINE 20 MG PO TABS
20.0000 mg | ORAL_TABLET | Freq: Two times a day (BID) | ORAL | Status: DC
  Administered 2019-05-06 – 2019-05-07 (×2): 20 mg via ORAL
  Filled 2019-05-06: qty 1

## 2019-05-06 NOTE — Consult Note (Addendum)
Advanced Heart Failure Team Consult Note   Primary Physician: Lavone Orn, MD PCP-Cardiologist:  [name], MD  HFMD: Dr Aundra Dubin   Reason for Consultation: Heart Failure   HPI:    Joycie Aerts Console is seen today for evaluation of heart failure at the request of Dr Myna Hidalgo.   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 Cardiomyopathy dates back to at least 2014 based on echoes (EF 30-35% in 2014).  She had DC-CV 2018 x2 for A Fib RVR. In 08/2017 she had AV nodal ablation with Medtronic CRT-D. She has been intolerant A Fib with drop in EF. She has been on xarelto.    Intolerant amio, mexiletine, sotalol, tikosyn, and ranolazine.   Yesterday she had 3 episodes of presyncope that was associated with SOB and fecal incontinence. Also had an ache in her abdomen. Says she had a tele visit with Dr Laurann Montana and she described the "ache" in her abdomen for the last  2-3 months. Weight at home has been stable 137-138 . Denies chest pain.   Last night she presented to [facility] with increased dyspnea and near syncope. EKG showed A flutter.CXR with atelectasis at the bases.  Pertinent admission labs included: Covid 19 negative, BNP 1069, HS Trop 46>57, Hgb 15.6, K 3.7, Creatinine 1.15.   Today she is feeling ok. Denies SOB.    Echo 2019 -Ef 25-30% , RV moderately reduced.   Review of Systems: [y] = yes, [ ]  = no    General: Weight gain [ ] ; Weight loss [ ] ; Anorexia [ ] ; Fatigue [Y ]; Fever [ ] ; Chills [ ] ; Weakness [Y ]   Cardiac: Chest pain/pressure [ ] ; Resting SOB [ ] ; Exertional SOB [Y ]; Orthopnea [ ] ; Pedal Edema [ ] ; Palpitations [ ] ; Syncope [ ] ; Presyncope [Y ]; Paroxysmal nocturnal dyspnea[ ]    Pulmonary: Cough [ ] ; Wheezing[ ] ; Hemoptysis[ ] ; Sputum [ ] ; Snoring [ ]    GI: Vomiting[ ] ; Dysphagia[ ] ; Melena[ ] ; Hematochezia [ ] ; Heartburn[ ] ; Abdominal pain [  Y]; Constipation [ ] ; Diarrhea [ ] ; BRBPR [ ]    GU: Hematuria[ ] ; Dysuria [ ] ; Nocturia[ ]    Vascular: Pain in legs with walking [ ] ; Pain in feet with lying flat [ ] ; Non-healing sores [ ] ; Stroke [ ] ; TIA [ ] ; Slurred speech [ ] ;   Neuro: Headaches[ ] ; Vertigo[ ] ; Seizures[ ] ; Paresthesias[ ] ;Blurred vision [ ] ; Diplopia [ ] ; Vision changes [ ]    Ortho/Skin: Arthritis [ ] ; Joint pain [ Y]; Muscle pain [ ] ; Joint swelling [ ] ; Back Pain [ ] ; Rash [ ]    Psych: Depression[ ] ; Anxiety[ ]    Heme: Bleeding problems [ ] ; Clotting disorders [ ] ; Anemia [ ]    Endocrine: Diabetes [ ] ; Thyroid dysfunction[ ]   Home Medications Prior to Admission medications   Medication Sig Start Date End Date Taking? Authorizing Provider  acetaminophen (TYLENOL) 500 MG tablet Take 500 mg by mouth every 6 (six) hours as needed for headache.   Yes [provider]  bismuth subsalicylate (PEPTO BISMOL) 262 MG/15ML suspension Take 15 mLs by mouth every 6 (six) hours as needed for indigestion.   Yes [provider]  bisoprolol (ZEBETA) 5 MG tablet TAKE 1/2 TABLET BY MOUTH EVERY MORNING AND TAKE 1 TABLET BY MOUTH EVERY EVENING Patient taking differently: Take 2.5 mg by mouth daily.  04/30/19  Yes Larey Dresser, MD  docusate sodium (COLACE) 100 MG capsule Take 100 mg by mouth daily as needed for mild constipation.   Yes [provider]  fluticasone (FLONASE) 50 MCG/ACT nasal spray Place 1 spray into both nostrils daily as needed for allergies or rhinitis.    Yes [provider]  furosemide (LASIX) 20 MG tablet Take 1 tablet (20 mg total) by mouth daily. 02/11/19  Yes Larey Dresser, MD  losartan (COZAAR) 25 MG tablet Take 0.5 tablets (12.5 mg total) by mouth at bedtime. 04/18/18  Yes Larey Dresser, MD  spironolactone (ALDACTONE) 25 MG tablet Take 1 tablet (25 mg total) by mouth daily. Patient taking differently: Take 12.5 mg by mouth every evening.  03/07/19  Yes Larey Dresser, MD    XARELTO 15 MG TABS tablet TAKE 1 TABLET BY MOUTH DAILY WITH SUPPER( RESUME ON JAN 2) Patient taking differently: Take 15 mg by mouth daily with supper.  10/25/18  Yes Larey Dresser, MD  zolpidem (AMBIEN) 5 MG tablet Take 1 tablet (5 mg total) by mouth at bedtime. 09/20/17  Yes Mariel Aloe, MD    Past Medical History: Past Medical History:  Diagnosis Date   Arthritis    Chronic combined systolic (congestive) and diastolic (congestive) heart failure (HCC)    Mitral regurgitation    Non-ischemic cardiomyopathy (HCC)    Persistent atrial fibrillation    Ventricular tachycardia ([place])     Past Surgical History: Past Surgical History:  Procedure Laterality Date   AV NODE ABLATION N/A 09/07/2017   Procedure: AV NODE ABLATION;  Surgeon: [name], MD;  Location: [place] [facility];  Service: Cardiovascular;  Laterality: N/A;   BIV ICD INSERTION CRT-D N/A 09/07/2017   Procedure: BIV ICD INSERTION CRT-D;  Surgeon: [name], MD;  Location: [place] [facility];  Service: Cardiovascular;  Laterality: N/A;   BUNIONECTOMY     CARDIAC CATHETERIZATION     in 2004 at [place]. "Insignificant blockage" per patient   CARDIAC CATHETERIZATION N/A 04/25/2016   Procedure: Left Heart Cath and Coronary Angiography;  Surgeon: Nelva Bush, MD;  Location: [place] [facility];  Service: Cardiovascular;  Laterality: N/A;   CARDIOVERSION N/A 06/29/2017   Procedure: CARDIOVERSION;  Surgeon: Larey Dresser, MD;  Location: [facility] ENDOSCOPY;  Service: Cardiovascular;  Laterality: N/A;   CARDIOVERSION N/A 08/28/2017   Procedure: CARDIOVERSION;  Surgeon: Larey Dresser, MD;  Location: [facility] ENDOSCOPY;  Service: Cardiovascular;  Laterality: N/A;   SHOULDER SURGERY     closed reduction   TEE WITHOUT CARDIOVERSION N/A 06/29/2017   Procedure: TRANSESOPHAGEAL ECHOCARDIOGRAM (TEE);  Surgeon: Larey Dresser, MD;  Location[facility] END[facility]COPY;  Service: Cardiovascular;  Laterality: N/A;    TONSILLECTOMY     TOTAL HIP ARTHROPLASTY Left 03/18/2013   Procedure: TOTAL HIP ARTHROPLASTY ANTERIOR APPROACH;  Surgeon: Mauri Pole, MD;  Location: [facility] OR[facility];  Service: Orthopedics;  Laterality: Left;   TUBAL LIGATION      Family History: Family History  Problem Relation Age of Onset   Lung cancer Mother    Heart attack Father     Social History: Social History   Socioeconomic History   Marital status: Widowed    Spouse name: Not on file   Number of children: Not on file   Years of education: Not on file   Highest education level: Not on file  Occupational History   Not on file  Social Needs   Financial resource strain: Not on file  Food insecurity    Worry: Not on file    Inability: Not on file   Transportation needs    Medical: Not on file    Non-medical: Not on file  Tobacco Use   Smoking status: Never Smoker   Smokeless tobacco: Never Used  Substance and Sexual Activity   Alcohol use: Yes    Alcohol/week: 3.0 standard drinks    Types: 3 Glasses of wine per week    Comment: per week   Drug use: No   Sexual activity: Not on file  Lifestyle   Physical activity    Days per week: Not on file    Minutes per session: Not on file   Stress: Not on file  Relationships   Social connections    Talks on phone: Not on file    Gets together: Not on file    Attends religious service: Not on file    Active member of club or organization: Not on file    Attends meetings of clubs or organizations: Not on file    Relationship status: Not on file  Other Topics Concern   Not on file  Social History Narrative   Not on file    Allergies:  Allergies  Allergen Reactions   Sotalol Other (See Comments)    Prolonged QTc   Alendronate Sodium Other (See Comments)    Arm pain   Amiodarone Nausea Only   Ciprofloxacin Other (See Comments)    Not effective   Compazine [Prochlorperazine]    Coreg [Carvedilol] Other (See Comments)    Fatigue     Metoprolol Other (See Comments)    Profound fatigue    Objective:    Vital Signs:   Pulse Rate:  [70-83] 83 (08/25 0701) Resp:  [13-21] 17 (08/25 0701) BP: (102-139)/(57-92) 114/70 (08/25 0701) SpO2:  [87 %-98 %] 93 % (08/25 0700)    Weight change: There were no vitals filed for this visit.  Intake/Output:  No intake or output data in the 24 hours ending 05/06/19 0742    Physical Exam   General:   No resp difficulty HEENT: normal Neck: supple. JVP 7-8  . Carotids 2+ bilat; no bruits. No lymphadenopathy or thyromegaly appreciated. Cor: PMI nondisplaced. Irregular rate & rhythm. No rubs, gallops or murmurs. Lungs: clear Abdomen: soft, nontender, nondistended. No hepatosplenomegaly. No bruits or masses. Good bowel sounds. Extremities: no cyanosis, clubbing, rash, edema Neuro: alert & orientedx3, cranial nerves grossly intact. moves all 4 extremities w/o difficulty. Affect pleasant   Telemetry   A fib 80s   EKG     A Fib fb 83 bpm   Labs   Basic Metabolic Panel: Recent Labs  Lab 05/05/19 2052 05/06/19 0428  NA 137 138  K 3.7 3.7  CL 105 108  CO2 19* 24  GLUCOSE 120* 127*  BUN 24* 23  CREATININE 1.15* 1.15*  CALCIUM 9.1 8.7*  MG 1.9  --     Liver Function Tests: Recent Labs  Lab 05/05/19 2052  AST 27  ALT 19  ALKPHOS 55  BILITOT 1.1  PROT 6.6  ALBUMIN 3.6   Recent Labs  Lab 05/05/19 2052  LIPASE 32   No results for input(s): AMMONIA in the last 168 hours.  CBC: Recent Labs  Lab 05/05/19 2052 05/06/19 0428  WBC 6.8 7.1  NEUTROABS 5.2  --   HGB 15.6* 14.4  HCT 43.8 41.3  MCV 90.7 92.6  PLT 172 155    Cardiac Enzymes: No results for  input(s): CKTOTAL, CKMB, CKMBINDEX, TROPONINI in the last 168 hours.  BNP: BNP (last 3 results) Recent Labs    08/26/18 1527 05/05/19 2051  BNP 603.8* 1,069.2*    ProBNP (last 3 results) No results for input(s): PROBNP in the last 8760 hours.   CBG: No results for input(s): GLUCAP in the last  168 hours.  Coagulation Studies: No results for input(s): LABPROT, INR in the last 72 hours.   Imaging   Dg Chest Port 1 View  Result Date: 05/05/2019 CLINICAL DATA:  Shortness of breath EXAM: PORTABLE CHEST 1 VIEW COMPARISON:  08/26/2018 FINDINGS: Left-sided pacing device as before. Enlarged cardiomediastinal silhouette with aortic atherosclerosis. Mild bibasilar atelectasis or scarring. No pneumothorax. IMPRESSION: Cardiomegaly with patchy scarring or atelectasis at the bases. Electronically Signed   By: Donavan Foil M.D.   On: 05/05/2019 21:14      Medications:     Current Medications:  bisoprolol  2.5 mg Oral Daily   furosemide  20 mg Oral Daily   losartan  12.5 mg Oral QHS   Rivaroxaban  15 mg Oral Q supper   sodium chloride flush  3 mL Intravenous Q12H   sodium chloride flush  3 mL Intravenous Q12H   spironolactone  12.5 mg Oral QPM   zolpidem  5 mg Oral QHS     Infusions:  sodium chloride         Assessment/Plan   1.  Presyncope ? Vagal associated with fecal incontinence. ? May need GI follow up.  Device interrogation did not show any arrhythmias to explain symptoms. Had brief NSVT in July, PVCs about 5 per hour.  -She is not orthostatic.   2.  Chronic Systolic Heart Failure  NICM, LHC 04/2016 showed no significant coronary disease. Cardiac MRI 10/17 EF 36%, diffuse HK, normal RV, biatrial enlargement, moderate MR, non-coronary LGE pattern involving the mid-wall of the basal to mid septum and inferior wall. Possibly prior myocarditis versus a form of infiltrative disease. Echo 12/17/17: EF 25-30%, severe diffuse HK, mild AI, mild MR, mod systolic dysfunction RV, LA dilation, RA dilation, moderate TR.  Medtronic CRT-D s/p AV nodal ablation -Volume status stable. Keep Lasix at 20 mg daily.   -Continue home dose losartan, spiro, bisoprolol.   3. Chronic A fib  s/p AV nodal ablation and BiV pacing.  - Continue Xarelto 15 mg daily. - Hgb stable.   4.  PVCs On device interrogation having ~ 5 PVCs per hour.   5. Mitral regurgitation: Mild on 4/19 echo.   Length of Stay: 0  Darrick Grinder, NP  05/06/2019, 7:42 AM  Advanced Heart Failure Team Pager [phone] (M-F; 7a - 4p)  Please contact [facility] for night-coverage after hours (4p -7a ) and weekends on [email or web address]  Patient seen with NP, agree with the above note.    She has been having "spells" at least once a week for the last few months where she will feel warm all over and sweat profusely.  Often associated with urge to have a bowel movement. She will feel weak but actually denies lightheadedness or syncope.  Events also associated with abdominal discomfort (peri-umbilical).  She had 3 of these episodes yesterday.  She is not orthostatic, labs stable.  Device interrogation shows her chronic atrial fibrillation with BiV pacing, about 5 PVCs/hr.  This is stable for her.  No arrhythmia to explain events.   General: NAD Neck: JVP 8 cm, no thyromegaly or thyroid nodule.  Lungs: Clear to auscultation bilaterally with normal  respiratory effort. CV: Nondisplaced PMI.  Heart regular S1/S2, no S3/S4, no murmur.  No peripheral edema.   Abdomen: Soft, nontender, no hepatosplenomegaly, no distention.  Skin: Intact without lesions or rashes.  Neurologic: Alert and oriented x 3.  Psych: Normal affect. Extremities: No clubbing or cyanosis.  HEENT: Normal.   1. ?Presyncopal spells: Episodes characterized by warmth, diaphoresis, urge to defecate, and vague abdominal discomfort as well as weakness. Not clearly lightheaded.  She is not orthostatic/BP stable, labs do not suggest dehydration and on exam, she is likely very mildly volume up (BNP elevated too).  No arrhythmia to explain events (device interrogated).  It is possible that these events are vagal.  I think we do need to explore possible GI triggers as abdominal discomfort seems to be involved.  - Could start with mesenteric dopplers to rule out  mesenteric artery stenosis/"intestinal angina."   2. Chronic systolic HF: NICM, LHC 10/9019 showed no significant coronary disease. Cardiac MRI 10/17 EF 36%, diffuse HK, normal RV, biatrial enlargement, moderate MR, non-coronary LGE pattern involving the mid-wall of the basal to mid septum and inferior wall. Possibly prior myocarditis versus a form of infiltrative disease. Echo 12/17/17: EF 25-30%, severe diffuse HK, mild AI, mild MR, mod systolic dysfunction RV, LA dilation, RA dilation, moderate TR.  Medtronic CRT-D s/p AV nodal ablation.  NYHA class II-III symptoms, fairly stable.  Exam suggests mild volume overload.     - I would have her continue her home Lasix 20 daily.  -Continue bisoprolol 2.5 mg bid.  Unable to tolerate increase.   -Continuelosartan 12.5 mg qhs. Unable to tolerate increase.  - Continue spironolactone 12.5 qhs.  Unable to tolerate increase.  - Echo ordered.  3. Atrial fibrillation: Chronic, s/p AV nodal ablation and BiV pacing.  - Continue Xarelto 15 mg daily. Hgb stable.  4. PVCs: Frequent PVCs have limited BiV pacing in the past.  Not a good PVC ablation candidate per EP. Cannot tolerate amiodarone, sotolol, or Tikosyn. Did not tolerate mexilitine with nausea. Most recently, stopped ranolazine due to nausea. Additionally, did not tolerate further uptitration of bisoprolol.  - I am not sure we have a lot of further options here to suppress her PVCs, but today's device interrogation does not show a marked burden (about 5/hr).  5. Mitral regurgitation: Mild on 4/19 echo. 6. H/o pulseless VT: No sustained VT on device interrogation.   [name] 05/06/2019 10:07 AM

## 2019-05-06 NOTE — Progress Notes (Signed)
PROGRESS NOTE    Cheryl Vargas  BWL:[id number] DOB: 1934/12/01 DOA: 05/05/2019 PCP: Lavone Orn, MD    Brief Narrative:  84 year old female with history of hypertension, hyperlipidemia, paroxysmal A. fib on Xarelto, history of PVCs, nonischemic cardiomyopathy with chronic systolic heart failure status post Medtronic CRT D.  History of PVCs and difficulty tolerating rate control medications in the past.  Patient does have history of ongoing abdominal spasms, episode of sweating and dizziness.  She had more frequent symptoms last few days, she had 3 symptoms yesterday so came to the ER.  No syncopal episodes.   Assessment & Plan:   Principal Problem:   Near syncope Active Problems:   Paroxysmal atrial fibrillation (HCC)   Non-ischemic cardiomyopathy (HCC)   CKD (chronic kidney disease), stage III (HCC)  Near syncope: Likely dyspepsia, peptic ulcer disease on GI spasms.  Benign examination.  Orthostatic negative.  AICD interrogation was uneventful.  Patient felt better with taking 1 dose of Pepcid.  Patient is not on any acid suppression medications. Start Pepcid 20 mg twice daily and will discharge on it. Mesenteric ultrasound ordered, less likely mesenteric ischemia.  Chronic heart failure: Chronic systolic congestive heart failure.  Currently euvolemic.  On Lasix, losartan, Aldactone and bisoprolol.  Continue.  Repeat echocardiogram today.  Chronic A. fib: Status post ablation and pacing.  On Xarelto therapeutic.  Hemoglobin is stable.  Continue to check orthostatic.  Ambulate patient and ensure safety on ambulation.  Mesenteric ultrasound.  Patient will try Pepcid, if no benefit will refer to GI.   DVT prophylaxis: Xarelto Code Status: Full code Family Communication: None Disposition Plan: Home after hospitalization.  Anticipate tomorrow.   Consultants:   Cardiology  Procedures:   None  Antimicrobials:   None   Subjective: Patient seen and examined.  Admitted  last night with multiple episodes of sound like vagal episodes where patient gets epigastric discomfort and sweaty especially after exertion.  No syncopal episode.  This has been happening since last 1 year and she had many episodes yesterday.  Objective: Vitals:   05/06/19 0700 05/06/19 0701 05/06/19 0836 05/06/19 0843  BP: 114/70 114/70 109/72   Pulse: 79 83 81   Resp: 18 17 20    Temp:   97.6 F (36.4 C)   TempSrc:   Oral   SpO2: 93%  97%   Weight:    63.5 kg  Height:    5\' 5"  (1.651 m)    Intake/Output Summary (Last 24 hours) at 05/06/2019 1346 Last data filed at 05/06/2019 1100 Gross per 24 hour  Intake 440 ml  Output 1100 ml  Net -660 ml   Filed Weights   05/06/19 0843  Weight: 63.5 kg    Examination:  General exam: Appears calm and comfortable  Respiratory system: Clear to auscultation. Respiratory effort normal. Cardiovascular system: S1 & S2 heard, RRR. No JVD, murmurs, rubs, gallops or clicks. No pedal edema.  AICD in place. Gastrointestinal system: Abdomen is nondistended, soft and nontender. No organomegaly or masses felt. Normal bowel sounds heard. Central nervous system: Alert and oriented. No focal neurological deficits. Extremities: Symmetric 5 x 5 power. Skin: No rashes, lesions or ulcers Psychiatry: Judgement and insight appear normal. Mood & affect appropriate.     Data Reviewed: I have personally reviewed following labs and imaging studies  CBC: Recent Labs  Lab 05/05/19 2052 05/06/19 0428  WBC 6.8 7.1  NEUTROABS 5.2  --   HGB 15.6* 14.4  HCT 43.8 41.3  MCV 90.7 92.6  PLT 172 962   Basic Metabolic Panel: Recent Labs  Lab 05/05/19 2052 05/06/19 0428  NA 137 138  K 3.7 3.7  CL 105 108  CO2 19* 24  GLUCOSE 120* 127*  BUN 24* 23  CREATININE 1.15* 1.15*  CALCIUM 9.1 8.7*  MG 1.9  --    GFR: Estimated Creatinine Clearance: 34.5 mL/min (A) (by C-G formula based on SCr of 1.15 mg/dL (H)). Liver Function Tests: Recent Labs  Lab  05/05/19 2052  AST 27  ALT 19  ALKPHOS 55  BILITOT 1.1  PROT 6.6  ALBUMIN 3.6   Recent Labs  Lab 05/05/19 2052  LIPASE 32   No results for input(s): AMMONIA in the last 168 hours. Coagulation Profile: No results for input(s): INR, PROTIME in the last 168 hours. Cardiac Enzymes: No results for input(s): CKTOTAL, CKMB, CKMBINDEX, TROPONINI in the last 168 hours. BNP (last 3 results) No results for input(s): PROBNP in the last 8760 hours. HbA1C: No results for input(s): HGBA1C in the last 72 hours. CBG: No results for input(s): GLUCAP in the last 168 hours. Lipid Profile: No results for input(s): CHOL, HDL, LDLCALC, TRIG, CHOLHDL, LDLDIRECT in the last 72 hours. Thyroid Function Tests: No results for input(s): TSH, T4TOTAL, FREET4, T3FREE, THYROIDAB in the last 72 hours. Anemia Panel: No results for input(s): VITAMINB12, FOLATE, FERRITIN, TIBC, IRON, RETICCTPCT in the last 72 hours. Sepsis Labs: No results for input(s): PROCALCITON, LATICACIDVEN in the last 168 hours.  Recent Results (from the past 240 hour(s))  SARS CORONAVIRUS 2 (TAT 6-12 HRS) Nasal Swab Aptima Multi Swab     Status: None   Collection Time: 05/05/19  9:29 PM   Specimen: Aptima Multi Swab; Nasal Swab  Result Value Ref Range Status   SARS Coronavirus 2 NEGATIVE NEGATIVE Final    Comment: (NOTE) SARS-CoV-2 target nucleic acids are NOT DETECTED. The SARS-CoV-2 RNA is generally detectable in upper and lower respiratory specimens during the acute phase of infection. Negative results do not preclude SARS-CoV-2 infection, do not rule out co-infections with other pathogens, and should not be used as the sole basis for treatment or other patient management decisions. Negative results must be combined with clinical observations, patient history, and epidemiological information. The expected result is Negative. Fact Sheet for Patients: SugarRoll.be Fact Sheet for Healthcare  Providers: https://www.woods-mathews.com/ This test is not yet approved or cleared by the [place] FDA and  has been authorized for detection and/or diagnosis of SARS-CoV-2 by FDA under an Emergency Use Authorization (EUA). This EUA will remain  in effect (meaning this test can be used) for the duration of the COVID-19 declaration under Section 56 4(b)(1) of the Act, 21 U.S.C. section 360bbb-3(b)(1), unless the authorization is terminated or revoked sooner. Performed at [facility], [place] [address], [place], [place] [address]          Radiology Studies: Dg Chest Port 1 View  Result Date: 05/05/2019 CLINICAL DATA:  Shortness of breath EXAM: PORTABLE CHEST 1 VIEW COMPARISON:  08/26/2018 FINDINGS: Left-sided pacing device as before. Enlarged cardiomediastinal silhouette with aortic atherosclerosis. Mild bibasilar atelectasis or scarring. No pneumothorax. IMPRESSION: Cardiomegaly with patchy scarring or atelectasis at the bases. Electronically Signed   By: Donavan Foil M.D.   On: 05/05/2019 21:14        Scheduled Meds: . bisoprolol  2.5 mg Oral Daily  . famotidine  20 mg Oral BID  . [START ON 05/07/2019] furosemide  20 mg Oral Daily  . losartan  12.5  mg Oral QHS  . Rivaroxaban  15 mg Oral Q supper  . sodium chloride flush  3 mL Intravenous Q12H  . sodium chloride flush  3 mL Intravenous Q12H  . spironolactone  12.5 mg Oral QPM  . zolpidem  5 mg Oral QHS   Continuous Infusions: . sodium chloride       LOS: 0 days    Time spent: 25 minutes    Barb Merino, MD [facility] Pager [phone]  If 7PM-7AM, please contact night-coverage [email or web address] Password [email or web address] 05/06/2019, 1:46 PM

## 2019-05-06 NOTE — ED Notes (Signed)
Placed pt on regular hospital bed for comfort.  Pt able to ambulate w/o any issues, was very tired after

## 2019-05-06 NOTE — Progress Notes (Signed)
  Echocardiogram 2D Echocardiogram has been performed.  Cheryl Vargas 05/06/2019, 12:05 PM

## 2019-05-06 NOTE — Progress Notes (Signed)
   Unable to obtain Mesenteric [place] today.   Discussed with Dr Aundra Dubin. Ok to send home with GI follow up.   She has f/u with Dr Aundra Dubin 05/28/19  [name] Clegg NP-C  12:34 PM

## 2019-05-06 NOTE — Progress Notes (Signed)
Per patient she takes bisoprolol twice a day, current order is daily,Dr. Sloan Leiter made aware via amion.

## 2019-05-07 ENCOUNTER — Ambulatory Visit (HOSPITAL_BASED_OUTPATIENT_CLINIC_OR_DEPARTMENT_OTHER): Payer: PPO

## 2019-05-07 DIAGNOSIS — R55 Syncope and collapse: Secondary | ICD-10-CM | POA: Diagnosis not present

## 2019-05-07 DIAGNOSIS — R52 Pain, unspecified: Secondary | ICD-10-CM | POA: Diagnosis not present

## 2019-05-07 DIAGNOSIS — I428 Other cardiomyopathies: Secondary | ICD-10-CM | POA: Diagnosis not present

## 2019-05-07 LAB — BASIC METABOLIC PANEL
Anion gap: 7 (ref 5–15)
BUN: 24 mg/dL — ABNORMAL HIGH (ref 8–23)
CO2: 24 mmol/L (ref 22–32)
Calcium: 8.7 mg/dL — ABNORMAL LOW (ref 8.9–10.3)
Chloride: 107 mmol/L (ref 98–111)
Creatinine, Ser: 1 mg/dL (ref 0.44–1.00)
GFR calc Af Amer: >60 mL/min (ref >60)
GFR calc non Af Amer: 53 mL/min — ABNORMAL LOW (ref >60)
Glucose, Bld: 98 mg/dL (ref 70–99)
Potassium: 4.6 mmol/L (ref 3.5–5.1)
Sodium: 138 mmol/L (ref 135–145)

## 2019-05-07 MED ORDER — FAMOTIDINE 20 MG PO TABS
20.0000 mg | ORAL_TABLET | Freq: Every day | ORAL | Status: DC
  Filled 2019-05-07: qty 1

## 2019-05-07 MED ORDER — FAMOTIDINE 20 MG PO TABS: 20.0000 mg | ORAL_TABLET | Freq: Every day | ORAL | 2 refills | Status: DC

## 2019-05-07 NOTE — Progress Notes (Signed)
Patient denies having a legal guardian.   Pt is A&O x4.   Does report she has a Event organiser and a Armed forces technical officer

## 2019-05-07 NOTE — Care Management Obs Status (Signed)
MEDICARE OBSERVATION STATUS NOTIFICATION   Patient Details  Name: TONEISHA SAVARY MRN: [id number] Date of Birth: November 02, 1934   Medicare Observation Status Notification Given:  Yes    Zenon Mayo, RN 05/07/2019, 12:42 PM

## 2019-05-07 NOTE — Progress Notes (Signed)
Patient ID: Cheryl Vargas, female   DOB: [birth year], 84 y.o.   MRN: [id number]     Advanced Heart Failure Rounding Note  PCP-Cardiologist: [name], MD   Subjective:    No further "spells" as inpatient.  She says that her abdominal discomfort improved with Pepcid.   Echo: Ef 25-30%, normal RV, mild MR, mod-severe TR.    Objective:   Weight Range: 62.9 kg Body mass index is 23.08 kg/m.   Vital Signs:   Temp:  [97.7 F (36.5 C)-98.1 F (36.7 C)] 97.8 F (36.6 C) (08/26 0758) Pulse Rate:  [79-82] 81 (08/26 0758) Resp:  [16-20] 20 (08/26 0758) BP: (103-114)/(56-72) 112/68 (08/26 0758) SpO2:  [93 %-99 %] 96 % (08/26 0758) Weight:  [62.9 kg] 62.9 kg (08/26 0546) Last BM Date: 05/07/19  Weight change: Filed Weights   05/06/19 0843 05/07/19 0546  Weight: 63.5 kg 62.9 kg    Intake/Output:   Intake/Output Summary (Last 24 hours) at 05/07/2019 0948 Last data filed at 05/07/2019 0818 Gross per 24 hour  Intake 1160 ml  Output 2450 ml  Net -1290 ml      Physical Exam    General:  Well appearing. No resp difficulty HEENT: Normal Neck: Supple. JVP not elevated. Carotids 2+ bilat; no bruits. No lymphadenopathy or thyromegaly appreciated. Cor: PMI nondisplaced. Regular rate & rhythm. No rubs, gallops.  1/6 HSM LLSB. Lungs: Clear Abdomen: Soft, nontender, nondistended. No hepatosplenomegaly. No bruits or masses. Good bowel sounds. Extremities: No cyanosis, clubbing, rash, edema Neuro: Alert & orientedx3, cranial nerves grossly intact. moves all 4 extremities w/o difficulty. Affect pleasant   Telemetry   Atrial fibrillation, BiV pacing.  1 short SVT run (about 8 beats).  Personally reviewed.   Labs    CBC Recent Labs    05/05/19 2052 05/06/19 0428  WBC 6.8 7.1  NEUTROABS 5.2  --   HGB 15.6* 14.4  HCT 43.8 41.3  MCV 90.7 92.6  PLT 172 884   Basic Metabolic Panel Recent Labs    05/05/19 2052 05/06/19 0428 05/07/19 0353  NA 137 138 138  K 3.7 3.7 4.6   CL 105 108 107  CO2 19* 24 24  GLUCOSE 120* 127* 98  BUN 24* 23 24*  CREATININE 1.15* 1.15* 1.00  CALCIUM 9.1 8.7* 8.7*  MG 1.9  --   --    Liver Function Tests Recent Labs    05/05/19 2052  AST 27  ALT 19  ALKPHOS 55  BILITOT 1.1  PROT 6.6  ALBUMIN 3.6   Recent Labs    05/05/19 2052  LIPASE 32   Cardiac Enzymes No results for input(s): CKTOTAL, CKMB, CKMBINDEX, TROPONINI in the last 72 hours.  BNP: BNP (last 3 results) Recent Labs    08/26/18 1527 05/05/19 2051  BNP 603.8* 1,069.2*    ProBNP (last 3 results) No results for input(s): PROBNP in the last 8760 hours.   D-Dimer No results for input(s): DDIMER in the last 72 hours. Hemoglobin A1C No results for input(s): HGBA1C in the last 72 hours. Fasting Lipid Panel No results for input(s): CHOL, HDL, LDLCALC, TRIG, CHOLHDL, LDLDIRECT in the last 72 hours. Thyroid Function Tests No results for input(s): TSH, T4TOTAL, T3FREE, THYROIDAB in the last 72 hours.  Invalid input(s): FREET3  Other results:   Imaging     No results found.   Medications:     Scheduled Medications: . bisoprolol  2.5 mg Oral BID  . famotidine  20 mg Oral BID  .  furosemide  20 mg Oral Daily  . losartan  12.5 mg Oral QHS  . Rivaroxaban  15 mg Oral Q supper  . sodium chloride flush  3 mL Intravenous Q12H  . spironolactone  12.5 mg Oral QPM  . zolpidem  5 mg Oral QHS     Infusions: . sodium chloride       PRN Medications:  sodium chloride, acetaminophen **OR** acetaminophen, bismuth subsalicylate, docusate sodium, HYDROcodone-acetaminophen, ondansetron **OR** ondansetron (ZOFRAN) IV, sodium chloride flush   Assessment/Plan   1. ?Presyncopal spells: Episodes characterized by warmth, diaphoresis, urge to defecate, and vague abdominal discomfort as well as weakness. Not clearly lightheaded.  She is not orthostatic/BP stable, labs do not suggest dehydration and on exam, she is likely very mildly volume up (BNP  elevated too).  No arrhythmia to explain events (device interrogated) and telemetry reviewed from overnight.  It is possible that these events are vagal.  I think we do need to explore possible GI triggers as abdominal discomfort seems to be involved => Pepcid seemed to help the abdominal discomfort, so I wonder if GERD or gastritis could be the trigger.  - Mesenteric dopplers done to rule out mesenteric artery stenosis/"intestinal angina" => preliminarily normal.   - Would send home on acid suppressant regimen.   2. Chronic systolic HF: [place] 05/6044 showed no significant coronary disease. Cardiac MRI 10/17 EF 36%, diffuse HK, normal RV, biatrial enlargement, moderate MR, non-coronary LGE pattern involving the mid-wall of the basal to mid septum and inferior wall. Possibly prior myocarditis versus a form of infiltrative disease. Echo 12/17/17: EF 25-30%, severe diffuse HK, mild AI, mild MR, mod systolic dysfunction RV, LA dilation, RA dilation, moderate TR. Medtronic CRT-D s/p AV nodal ablation. NYHA class II-III symptoms, fairly stable. Echo this admission showed stable EF 25-30%.  She does not appear volume overloaded today. -I would have her continue her home Lasix 20 daily.  -Continue bisoprolol 2.5 mg bid. Unable to tolerate increase.  -Continuelosartan 12.5 mg qhs. Unable to tolerate increase.  - Continue spironolactone 12.5 qhs. Unable to tolerate increase.  - Echo ordered.  3. Atrial fibrillation: Chronic, s/p AV nodal ablation and BiV pacing.  - Continue Xarelto 15 mg daily. Hgb stable.  4. PVCs: Frequent PVCs have limited BiV pacing in the past. Not a good PVC ablation candidate per EP. Cannot tolerate amiodarone, sotolol, or Tikosyn. Did not tolerate mexilitine with nausea. Most recently, stopped ranolazine due to nausea. Additionally, did not tolerate further uptitration of bisoprolol.  - I am not sure we have a lot of further options here to suppress her PVCs, but yesterday's  device interrogation did not show a marked burden (about 5/hr).  5. Mitral regurgitation: Mild on echo. 6. H/o pulseless VT: No sustained VT on device interrogation.   She can go home from my standpoint, needs peptic acid suppression regimen.   Length of Stay: 0  [name], MD  05/07/2019, 9:48 AM  Advanced Heart Failure Team Pager [phone] (M-F; 7a - 4p)  Please contact [facility] for night-coverage after hours (4p -7a ) and weekends on [email or web address]

## 2019-05-07 NOTE — Progress Notes (Signed)
Mesenteric artery duplex completed. Refer to "CV Proc" under chart review to view preliminary results.  05/07/2019 9:51 AM Maudry Mayhew, MHA, RVT, RDCS, RDMS

## 2019-05-07 NOTE — Discharge Summary (Signed)
Physician Discharge Summary  Cheryl Vargas ZOX:[id number] DOB: 20-Dec-1934 DOA: 05/05/2019  PCP: Lavone Orn, MD  Admit date: 05/05/2019 Discharge date: 05/07/2019  Admitted From: Home Disposition: Home  Recommendations for Outpatient Follow-up:  1. Follow up with PCP in 1-2 weeks 2. We will refer you to your gastroenterologist to schedule a follow-up.  Home Health: Not applicable Equipment/Devices: Not applicable  Discharge Condition: Stable CODE STATUS: Full code Diet recommendation: Low-salt diet  Discharge summary: Patient with extensive cardiovascular problems, paroxysmal A. fib on Xarelto, on bisoprolol for rate control, has pacemaker, nonischemic cardiomyopathy with known ejection fraction of 30% who presented to the hospital with episodes characterized by warmth, diaphoresis, eyes to defecate and vague abdominal discomfort mostly in the epigastrium ongoing for more than 6 months, happening once or twice a week before but recently happening more frequently came to the hospital because of this worsening symptoms. Negative for orthostatic.  Device interrogation was normal.  Occasional PVCs but no arrhythmias during events.  Echocardiogram is stable as before.  Mesenteric ultrasound negative for mesenteric artery stenosis.  Patient is started on Pepcid 20 mg twice daily, already feels better, no recurrence of events last 48 hours.  Asymptomatic today, going home, added Pepcid.  She follows up at [place], had a colonoscopy in the past, will refer her to Dr. Sarina Ser for elective follow-up to evaluate for endoscopy if she continues to have epigastric pain and cramping.   Discharge Diagnoses:  Principal Problem:   Near syncope Active Problems:   Paroxysmal atrial fibrillation (HCC)   Non-ischemic cardiomyopathy (HCC)   CKD (chronic kidney disease), stage III [facility])    Discharge Instructions  Discharge Instructions    Ambulatory referral to Gastroenterology   Complete by:  As directed    Started on pepcid , needs follow up evaluation   What is the reason for referral?: Other Comment - prolonged dyspepsia    Diet - low sodium heart healthy   Complete by: As directed    Increase activity slowly   Complete by: As directed      Allergies as of 05/07/2019      Reactions   Sotalol Other (See Comments)   Prolonged QTc   Alendronate Sodium Other (See Comments)   Arm pain   Amiodarone Nausea Only   Ciprofloxacin Other (See Comments)   Not effective   Compazine [prochlorperazine]    Coreg [carvedilol] Other (See Comments)   Fatigue   Metoprolol Other (See Comments)   Profound fatigue      Medication List    TAKE these medications   acetaminophen 500 MG tablet Commonly known as: TYLENOL Take 500 mg by mouth every 6 (six) hours as needed for headache.   bismuth subsalicylate 811 BJ/47WG suspension Commonly known as: PEPTO BISMOL Take 15 mLs by mouth every 6 (six) hours as needed for indigestion.   bisoprolol 5 MG tablet Commonly known as: ZEBETA TAKE 1/2 TABLET BY MOUTH EVERY MORNING AND TAKE 1 TABLET BY MOUTH EVERY EVENING What changed: See the new instructions.   docusate sodium 100 MG capsule Commonly known as: COLACE Take 100 mg by mouth daily as needed for mild constipation.   famotidine 20 MG tablet Commonly known as: PEPCID Take 1 tablet (20 mg total) by mouth daily. Start taking on: May 08, 2019   fluticasone 50 MCG/ACT nasal spray Commonly known as: FLONASE Place 1 spray into both nostrils daily as needed for allergies or rhinitis.   furosemide 20 MG tablet Commonly known as: LASIX  Take 1 tablet (20 mg total) by mouth daily.   losartan 25 MG tablet Commonly known as: COZAAR Take 0.5 tablets (12.5 mg total) by mouth at bedtime.   spironolactone 25 MG tablet Commonly known as: ALDACTONE Take 1 tablet (25 mg total) by mouth daily. What changed:   how much to take  when to take this   Xarelto 15 MG Tabs tablet Generic  drug: Rivaroxaban TAKE 1 TABLET BY MOUTH DAILY WITH SUPPER( RESUME ON JAN 2) What changed: See the new instructions.   zolpidem 5 MG tablet Commonly known as: AMBIEN Take 1 tablet (5 mg total) by mouth at bedtime.       Allergies  Allergen Reactions  . Sotalol Other (See Comments)    Prolonged QTc  . Alendronate Sodium Other (See Comments)    Arm pain  . Amiodarone Nausea Only  . Ciprofloxacin Other (See Comments)    Not effective  . Compazine [Prochlorperazine]   . Coreg [Carvedilol] Other (See Comments)    Fatigue   . Metoprolol Other (See Comments)    Profound fatigue    Consultations:  Cardiology heart failure   Procedures/Studies: Dg Chest Port 1 View  Result Date: 05/05/2019 CLINICAL DATA:  Shortness of breath EXAM: PORTABLE CHEST 1 VIEW COMPARISON:  08/26/2018 FINDINGS: Left-sided pacing device as before. Enlarged cardiomediastinal silhouette with aortic atherosclerosis. Mild bibasilar atelectasis or scarring. No pneumothorax. IMPRESSION: Cardiomegaly with patchy scarring or atelectasis at the bases. Electronically Signed   By: Donavan Foil M.D.   On: 05/05/2019 21:14   Vas [place] Mesenteric  Result Date: 05/07/2019 ABDOMINAL VISCERAL Indications: Episodic abdominal pain Other Factors: Non-ischemic cardiomyopathy. Performing Technologist: Maudry Mayhew MHA, RDMS, RVT, RDCS  Examination Guidelines: A complete evaluation includes B-mode imaging, spectral Doppler, color Doppler, and power Doppler as needed of all accessible portions of each vessel. Bilateral testing is considered an integral part of a complete examination. Limited examinations for reoccurring indications may be performed as noted.  Duplex Findings: +--------------------+--------+--------+------+--------+ Mesenteric          PSV cm/sEDV cm/sPlaqueComments +--------------------+--------+--------+------+--------+ Aorta Prox             36                           +--------------------+--------+--------+------+--------+ Aorta Mid              63                          +--------------------+--------+--------+------+--------+ Aorta Distal           52                          +--------------------+--------+--------+------+--------+ Celiac Artery Origin   45      10                  +--------------------+--------+--------+------+--------+ SMA Proximal          118      17                  +--------------------+--------+--------+------+--------+ SMA Mid                97                          +--------------------+--------+--------+------+--------+ SMA Distal  88                          +--------------------+--------+--------+------+--------+ CHA                    66      16                  +--------------------+--------+--------+------+--------+ Splenic                49      18                  +--------------------+--------+--------+------+--------+ IMA                    76                          +--------------------+--------+--------+------+--------+  Summary: Mesenteric: Normal Celiac artery , Superior Mesenteric artery, Inferior Mesenteric artery, Splenic artery and Hepatic artery findings. Splenic vein, SMV, and IVC are patent.  *See table(s) above for measurements and observations.     Preliminary     Echocardiogram, EF 25 to 30% is stable since before patient is euvolemic Mesenteric ultrasound, no stenosis   Subjective: Patient seen and examined.  No similar spells since being in the hospital setting the night before.  She is ready to go home.   Discharge Exam: Vitals:   05/07/19 0546 05/07/19 0758  BP: 107/72 112/68  Pulse: 79 81  Resp: 16 20  Temp: 98.1 F (36.7 C) 97.8 F (36.6 C)  SpO2:  96%   Vitals:   05/06/19 2030 05/07/19 0049 05/07/19 0546 05/07/19 0758  BP: 114/72 (!) 103/56 107/72 112/68  Pulse: 79 82 79 81  Resp: 16 16 16 20   Temp: 97.7 F (36.5 C) 98 F  (36.7 C) 98.1 F (36.7 C) 97.8 F (36.6 C)  TempSrc: Oral  Oral Oral  SpO2: 99% 93%  96%  Weight:   62.9 kg   Height:        General: Pt is alert, awake, not in acute distress Cardiovascular: RRR, S1/S2 +, no rubs, no gallops, pacemaker present. Respiratory: CTA bilaterally, no wheezing, no rhonchi Abdominal: Soft, NT, ND, bowel sounds + Extremities: no edema, no cyanosis    The results of significant diagnostics from this hospitalization (including imaging, microbiology, ancillary and laboratory) are listed below for reference.     Microbiology: Recent Results (from the past 240 hour(s))  SARS CORONAVIRUS 2 (TAT 6-12 HRS) Nasal Swab Aptima Multi Swab     Status: None   Collection Time: 05/05/19  9:29 PM   Specimen: Aptima Multi Swab; Nasal Swab  Result Value Ref Range Status   SARS Coronavirus 2 NEGATIVE NEGATIVE Final    Comment: (NOTE) SARS-CoV-2 target nucleic acids are NOT DETECTED. The SARS-CoV-2 RNA is generally detectable in upper and lower respiratory specimens during the acute phase of infection. Negative results do not preclude SARS-CoV-2 infection, do not rule out co-infections with other pathogens, and should not be used as the sole basis for treatment or other patient management decisions. Negative results must be combined with clinical observations, patient history, and epidemiological information. The expected result is Negative. Fact Sheet for Patients: SugarRoll.be Fact Sheet for Healthcare Providers: https://www.woods-mathews.com/ This test is not yet approved or cleared by the [place] FDA and  has been authorized for detection and/or diagnosis of SARS-CoV-2 by FDA under an  Emergency Use Authorization (EUA). This EUA will remain  in effect (meaning this test can be used) for the duration of the COVID-19 declaration under Section 56 4(b)(1) of the Act, 21 U.S.C. section 360bbb-3(b)(1), unless the  authorization is terminated or revoked sooner. Performed at [facility], [place] [address], [place], [place] [address]      Labs: BNP (last 3 results) Recent Labs    08/26/18 1527 05/05/19 2051  BNP 603.8* 5,188.4*   Basic Metabolic Panel: Recent Labs  Lab 05/05/19 2052 05/06/19 0428 05/07/19 0353  NA 137 138 138  K 3.7 3.7 4.6  CL 105 108 107  CO2 19* 24 24  GLUCOSE 120* 127* 98  BUN 24* 23 24*  CREATININE 1.15* 1.15* 1.00  CALCIUM 9.1 8.7* 8.7*  MG 1.9  --   --    Liver Function Tests: Recent Labs  Lab 05/05/19 2052  AST 27  ALT 19  ALKPHOS 55  BILITOT 1.1  PROT 6.6  ALBUMIN 3.6   Recent Labs  Lab 05/05/19 2052  LIPASE 32   No results for input(s): AMMONIA in the last 168 hours. CBC: Recent Labs  Lab 05/05/19 2052 05/06/19 0428  WBC 6.8 7.1  NEUTROABS 5.2  --   HGB 15.6* 14.4  HCT 43.8 41.3  MCV 90.7 92.6  PLT 172 155   Cardiac Enzymes: No results for input(s): CKTOTAL, CKMB, CKMBINDEX, TROPONINI in the last 168 hours. BNP: Invalid input(s): POCBNP CBG: No results for input(s): GLUCAP in the last 168 hours. D-Dimer No results for input(s): DDIMER in the last 72 hours. Hgb A1c No results for input(s): HGBA1C in the last 72 hours. Lipid Profile No results for input(s): CHOL, HDL, LDLCALC, TRIG, CHOLHDL, LDLDIRECT in the last 72 hours. Thyroid function studies No results for input(s): TSH, T4TOTAL, T3FREE, THYROIDAB in the last 72 hours.  Invalid input(s): FREET3 Anemia work up No results for input(s): VITAMINB12, FOLATE, FERRITIN, TIBC, IRON, RETICCTPCT in the last 72 hours. Urinalysis    Component Value Date/Time   COLORURINE AMBER (A) 01/21/2018 1948   APPEARANCEUR HAZY (A) 01/21/2018 1948   LABSPEC 1.009 01/21/2018 1948   PHURINE 7.0 01/21/2018 1948   GLUCOSEU NEGATIVE 01/21/2018 1948   HGBUR SMALL (A) 01/21/2018 1948   BILIRUBINUR NEGATIVE 01/21/2018 1948   KETONESUR NEGATIVE 01/21/2018 1948   PROTEINUR NEGATIVE  01/21/2018 1948   UROBILINOGEN 0.2 03/17/2013 1423   NITRITE NEGATIVE 01/21/2018 1948   LEUKOCYTESUR NEGATIVE 01/21/2018 1948   Sepsis Labs Invalid input(s): PROCALCITONIN,  WBC,  LACTICIDVEN Microbiology Recent Results (from the past 240 hour(s))  SARS CORONAVIRUS 2 (TAT 6-12 HRS) Nasal Swab Aptima Multi Swab     Status: None   Collection Time: 05/05/19  9:29 PM   Specimen: Aptima Multi Swab; Nasal Swab  Result Value Ref Range Status   SARS Coronavirus 2 NEGATIVE NEGATIVE Final    Comment: (NOTE) SARS-CoV-2 target nucleic acids are NOT DETECTED. The SARS-CoV-2 RNA is generally detectable in upper and lower respiratory specimens during the acute phase of infection. Negative results do not preclude SARS-CoV-2 infection, do not rule out co-infections with other pathogens, and should not be used as the sole basis for treatment or other patient management decisions. Negative results must be combined with clinical observations, patient history, and epidemiological information. The expected result is Negative. Fact Sheet for Patients: SugarRoll.be Fact Sheet for Healthcare Providers: https://www.woods-mathews.com/ This test is not yet approved or cleared by the [place] FDA and  has been authorized for  detection and/or diagnosis of SARS-CoV-2 by FDA under an Emergency Use Authorization (EUA). This EUA will remain  in effect (meaning this test can be used) for the duration of the COVID-19 declaration under Section 56 4(b)(1) of the Act, 21 U.S.C. section 360bbb-3(b)(1), unless the authorization is terminated or revoked sooner. Performed at [facility], [place] [address] [place], [place] [address]      Time coordinating discharge: 32 minutes  SIGNED:   Barb Merino, MD  [facility] 05/07/2019, 10:26 AM

## 2019-05-13 DIAGNOSIS — R55 Syncope and collapse: Secondary | ICD-10-CM | POA: Diagnosis not present

## 2019-05-22 ENCOUNTER — Ambulatory Visit (INDEPENDENT_AMBULATORY_CARE_PROVIDER_SITE_OTHER): Payer: PPO

## 2019-05-22 DIAGNOSIS — I5022 Chronic systolic (congestive) heart failure: Secondary | ICD-10-CM

## 2019-05-22 DIAGNOSIS — Z95810 Presence of automatic (implantable) cardiac defibrillator: Secondary | ICD-10-CM | POA: Diagnosis not present

## 2019-05-23 NOTE — Progress Notes (Signed)
EPIC Encounter for ICM Monitoring  Patient Name: MAN BONNEAU is a 84 y.o. female Date: 05/23/2019 Primary Care Physican: Lavone Orn, MD Primary Cardiologist:[place] Electrophysiologist:[facility] Bi-V Pacing: 96.5% 6/1/2020Weight:141.2lbs     Spoke with patient.  Transmission reviewed. She is feeling fine at this time and has no complaints.  OptivolThoracic impedancenormal.  Prescribed: Furosemide20 mg take 1 tablet by mouth. Take an extra tablets daily as needed for weight gain of greater than 3 pounds. Taking Furosemide Differently: Taking PRN based on weight.  Recommendations:   No changes and encouraged to call if experiencing any fluid symptoms.  Follow-up plan: ICM clinic phone appointment on 07/07/2019.   91 day device clinic remote transmission 06/11/2019.  Office appt 05/28/2019 with Dr. Aundra Dubin.    Copy of ICM check sent to Dr. Rayann Heman.   3 month ICM trend: 05/22/2019    1 Year ICM trend:       Rosalene Billings, RN 05/23/2019 12:15 PM

## 2019-05-27 ENCOUNTER — Other Ambulatory Visit (HOSPITAL_COMMUNITY): Payer: Self-pay | Admitting: Cardiology

## 2019-05-28 ENCOUNTER — Encounter (HOSPITAL_COMMUNITY): Payer: Self-pay | Admitting: Cardiology

## 2019-05-28 ENCOUNTER — Other Ambulatory Visit: Payer: Self-pay

## 2019-05-28 ENCOUNTER — Other Ambulatory Visit (HOSPITAL_COMMUNITY): Payer: PPO

## 2019-05-28 ENCOUNTER — Ambulatory Visit (HOSPITAL_COMMUNITY)
Admission: RE | Admit: 2019-05-28 | Discharge: 2019-05-28 | Disposition: A | Discharge status: Home / Self Care | Payer: PPO | Source: Ambulatory Visit | Attending: Cardiology | Admitting: Cardiology

## 2019-05-28 VITALS — BP 106/80 | HR 82 | Wt 138.4 lb

## 2019-05-28 DIAGNOSIS — E785 Hyperlipidemia, unspecified: Secondary | ICD-10-CM | POA: Insufficient documentation

## 2019-05-28 DIAGNOSIS — I340 Nonrheumatic mitral (valve) insufficiency: Secondary | ICD-10-CM | POA: Diagnosis not present

## 2019-05-28 DIAGNOSIS — Z9889 Other specified postprocedural states: Secondary | ICD-10-CM | POA: Insufficient documentation

## 2019-05-28 DIAGNOSIS — Z7901 Long term (current) use of anticoagulants: Secondary | ICD-10-CM | POA: Insufficient documentation

## 2019-05-28 DIAGNOSIS — R1013 Epigastric pain: Secondary | ICD-10-CM | POA: Diagnosis not present

## 2019-05-28 DIAGNOSIS — I428 Other cardiomyopathies: Secondary | ICD-10-CM | POA: Insufficient documentation

## 2019-05-28 DIAGNOSIS — I110 Hypertensive heart disease with heart failure: Secondary | ICD-10-CM | POA: Insufficient documentation

## 2019-05-28 DIAGNOSIS — R9431 Abnormal electrocardiogram [ECG] [EKG]: Secondary | ICD-10-CM | POA: Insufficient documentation

## 2019-05-28 DIAGNOSIS — I480 Paroxysmal atrial fibrillation: Secondary | ICD-10-CM | POA: Diagnosis not present

## 2019-05-28 DIAGNOSIS — Z79899 Other long term (current) drug therapy: Secondary | ICD-10-CM | POA: Insufficient documentation

## 2019-05-28 DIAGNOSIS — I5022 Chronic systolic (congestive) heart failure: Secondary | ICD-10-CM | POA: Insufficient documentation

## 2019-05-28 DIAGNOSIS — I493 Ventricular premature depolarization: Secondary | ICD-10-CM | POA: Insufficient documentation

## 2019-05-28 DIAGNOSIS — R55 Syncope and collapse: Secondary | ICD-10-CM | POA: Insufficient documentation

## 2019-05-28 MED ORDER — LORAZEPAM 0.5 MG PO TABS: 0.5000 mg | ORAL_TABLET | Freq: Every day | ORAL | 0 refills | Status: DC | PRN

## 2019-05-28 NOTE — Progress Notes (Signed)
Advanced Heart Failure Clinic Note   Primary Care: Dr Laurann Montana HF Cardiology: Dr. Aundra Dubin  HPI: [name] is an 84 y.o. female, retired [employer] scientist, with a past medical history of HTN, HLD, PAF (On Xarelto), and nonischemic cardiomyopathy with chronic systolic CHF. Cardiomyopathy dates back to at least 2014 based on echoes (EF 30-35% in 2014).   Admitted 8/17 for fatigue and dyspnea.  She was seen by electrophysiology on 04/17/16, and was started on po Amiodarone as she reported increased palpitations and fatigue with frequent PVCs. Her BNP was elevated at 1592.6 and chest x ray was consistent with CHF.  She was diuresed.  With her complaints of new onset fatigue and dyspnea combined with her known LV dysfunction, it was felt that she would benefit from an ischemic evaluation by cath. She underwent left heart cath with normal cors. Amiodarone was later stopped due to nausea.  Discharge weight 143 pounds. Echo (8/17) with EF 40-45%, inferoseptal akinesis. Cardiac MRI 10/17 with EF 36%, LGE mid wall pattern in the basal to mid septum and inferior wall. Holter monitor in 9/17 with runs of atrial fibrillation/RVR.  She has had trouble tolerating cardiac meds due to hypotension/lightheadedness.    She has had difficulty with symptomatic atrial fibrillation. She developed symptomatic atrial fibrillation with RVR again in 10/18, difficult to control rate.  She had TEE-guided DCCV with resumption of NSR. On TEE in rapid atrial fibrillation, EF 20-25%.  On TTE after DCCV, EF back to 40-45% range.  MR reported as moderate to severe but I reviewed echo and think it appears more in the moderate range.    She had recurrent atrial fibrillation after 10/18 DCCV. She was admitted in 12/18 with atrial fibrillation and RVR, she also had a run of WCT thought to be VT.   The atrial fibrillation was very difficult to control.  She was thought to be a poor candidate for atrial fibrillation ablation.  She finally had AV nodal  ablation with Medtronic CRT-D device in 12/18. She was then admitted briefly with CHF in 1/19.  Echo in 12/18 showed EF 25-30%, moderate MR, moderate TR, severe LAE.  Echo in 4/19 showed EF 25-30%, diffuse hypokinesis, mild MR, moderately decreased RV systolic function.   Admitted 4/7 - 12/21/17 with A/C CHF. Found to have decreased BiV pacing in setting of frequent PVCs. Tried on Mexiletine but failed due to nausea. Switched to Ranexa. Diuresed with IV lasix.   In 5/19, she was admitted with a near-syncopal episode that occurred while straining for a bowel movement.  Suspected vagal event.   She has stopped ranolazine due to nausea.  She did not tolerate increase in bisoprolol.   She was admitted in 8/20 with presyncope; she would get abdominal pain followed by a feeling of warmth and diaphoresis, then she would get dizzy.  No syncope. No explanatory arrhythmias were seen on device interrogation or telemetry in the hospital.  She was not orthostatic or dry.  Thought to be possibly a vagal response to GI trigger (dyspepsia).  Mesenteric dopplers in 8/20 were normal. She feels like Pepcid has helped.   She returns for followup of atrial fibrillation and CHF today.  She is now taking Pepcid and has had no recurrence of vagal-type symptoms.  Less dyspepsia.  She has generalized fatigue.  She is able to do housework without dyspnea.  No problems walking on flat ground.  She is short of breath walking up stairs.  Chronic 2 pillow orthopnea.  No BRBPR/melena. Still  with occasional episodes of nausea.   Labs (8/17): K 4.2, creatinine 0.85, BNP 1593, HCT 43.2, TSH mild elevated 5.3, free T4 normal Labs (9/17): K 4.1, creatinine 0.98 => 0.92, BNP 258, SPEP negative Labs (2/18): K 4, creatinine 0.83, HCT 40.8 Labs (10/18): K 4.2, creatinine 0.94, hgb 14.9 Labs (1/19): K 4.9, creatinine 0.72 Labs (2/19): K 4.1, creatinine 0.97 Labs (5/19): K 3.4, creatinine 1.17 Labs (6/19): K 3.9, creatinine 1.2 Labs  (8/19): K 3.7, creatinine 1.14 Labs (1/20): K 3.9, creatinine 1.25 Labs (8/20): K 4.1, creatinine 1.0, hgb 14.4  1. Chronic systolic CHF:  Nonischemic cardiomyopathy.   - Echo (2014): EF 30-35%. - Echo (2/17): EF 40-45% - Echo (8/17): EF 40-45%, mid to apical inferoseptal akinesis - Coronary angiography (8/17): No significant CAD.  - Cardiac MRI (10/17): EF 36%, moderate MR, normal RV size and systolic function, mid-wall LGE in the basal to mid septum and inferior wall.  - TEE (10/18): EF 20-25% but in rapid afib with HR up to 150 bpm, mild MR.  - Echo (10/18): EF 40-45%, diffuse hypokinesis, moderate LAE, reportedly moderate-severe MR (looks moderate on my review).  - Echo (12/18): EF 25-30%, moderate MR, moderate TR, severe LAE - Medtronic CRT-D device s/p AV nodal ablation.  - Echo (4/19): EF 25-30%, diffuse hypokinesis, mild MR, moderately decreased RV systolic function.  - Echo (8/20): EF 25-30%, normal RV size and systolic function, mild MR, moderate-severe TR.  2. Atrial fibrillation: Paroxysmal.  Diagnosed 2/17.  Unable to tolerate amiodarone.  - Holter (9/17): atrial fibrillation with RVR runs, 5% PVCs.  - AV nodal ablation with BiV pacing in 12/18.  - Nausea with ranolazine.  3. Hyperlipidemia 4. PVCs: frequent.  5. Long QT interval 6. Mesenteric dopplers (8/20): normal.   Review of systems complete and found to be negative unless listed in HPI.    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 Occasional ETOH, never heavy.    Current Outpatient Medications  Medication Sig Dispense Refill  . acetaminophen (TYLENOL) 500 MG tablet Take 500 mg by mouth every 6 (six) hours as needed for headache.    . bisoprolol (ZEBETA) 5 MG tablet Take 2.5 mg by mouth 2 (two) times daily.    Cheryl Vargas docusate sodium (COLACE) 100 MG capsule Take 100 mg by mouth daily as needed for mild constipation.    . famotidine (PEPCID) 20 MG  tablet Take 1 tablet (20 mg total) by mouth daily. 30 tablet 2  . fluticasone (FLONASE) 50 MCG/ACT nasal spray Place 1 spray into both nostrils daily as needed for allergies or rhinitis.     . furosemide (LASIX) 20 MG tablet Take 1 tablet (20 mg total) by mouth daily. 30 tablet 11  . losartan (COZAAR) 25 MG tablet TAKE 1/2 TABLET BY MOUTH AT BEDTIME 15 tablet 6  . Rivaroxaban (XARELTO) 15 MG TABS tablet Take 15 mg by mouth daily with supper.    Cheryl Vargas spironolactone (ALDACTONE) 25 MG tablet Take 12.5 mg by mouth daily.    Cheryl Vargas zolpidem (AMBIEN) 5 MG tablet Take 1 tablet (5 mg total) by mouth at bedtime. 5 tablet 0  . LORazepam (ATIVAN) 0.5 MG tablet Take 1 tablet (0.5 mg total) by mouth daily as needed (nausea). 10 tablet 0   No current facility-administered medications for this encounter.     Vitals:   05/28/19 1100  BP: 106/80  Pulse: 82  SpO2: 97%  Weight: 62.8  kg (138 lb 6.4 oz)   Wt Readings from Last 3 Encounters:  05/28/19 62.8 kg (138 lb 6.4 oz)  05/07/19 62.9 kg (138 lb 11.2 oz)  02/11/19 64 kg (141 lb 2 oz)    PHYSICAL EXAM: General: NAD Neck: No JVD, no thyromegaly or thyroid nodule.  Lungs: Clear to auscultation bilaterally with normal respiratory effort. CV: Nondisplaced PMI.  Heart regular S1/S2, no S3/S4, no murmur.  No peripheral edema.  No carotid bruit.  Normal pedal pulses.  Abdomen: Soft, nontender, no hepatosplenomegaly, no distention.  Skin: Intact without lesions or rashes.  Neurologic: Alert and oriented x 3.  Psych: Normal affect. Extremities: No clubbing or cyanosis.  HEENT: Normal.   ASSESSMENT & PLAN:  1. Chronic systolic HF: NICM, LHC 10/1306 showed no significant coronary disease. Cardiac MRI 10/17 EF 36%, diffuse HK, normal RV, biatrial enlargement, moderate MR, non-coronary LGE pattern involving the mid-wall of the basal to mid septum and inferior wall. Possibly prior myocarditis versus a form of infiltrative disease. Echo in 8/20 showed EF 25-30%, severe  diffuse HK, normal RV, moderate-severe TR.  Medtronic CRT-D s/p AV nodal ablation.  NYHA class II-III symptoms, stable.  She is not volume overloaded on exam.  - Continue Lasix 20 mg daily.  Recent BMET ok.   -Continue bisoprolol 2.5 mg bid.  Unable to tolerate increase.   -Continuelosartan 12.5 mg qHS. Unable to tolerate increase.  - Continue spironolactone 12.5 daily.  Unable to tolerate increase.  2. Atrial fibrillation: Chronic, s/p AV nodal ablation and BiV pacing.  - Continue Xarelto 15 mg daily.  3. PVCs: Frequent PVCs have limited BiV pacing.  Not a good PVC ablation candidate per EP. Cannot tolerate amiodarone, sotolol, or Tikosyn. Did not tolerate mexilitine with nausea. Most recently, stopped ranolazine due to nausea. Additionally, did not tolerate further uptitration of bisoprolol.  - I am not sure we have a lot of further options here to suppress her PVCs.  4. Mitral regurgitation: Mild on 8/20 echo. 5. H/o pulseless VT: Has ICD.  6. Presyncope: Recent episodes, possibly vagal related to dyspepsia.  Dyspepsia is better with Pepcid.  However, she still has occasional nausea.  Ativan has helped this the most.  - I will give her a low dose of ativan (0.5 mg) to take when nauseated.   Followup in 3 months   [name], MD  05/28/2019

## 2019-05-28 NOTE — Progress Notes (Signed)
Medication Samples have been provided to the patient.  Drug name: Xarelto       Strength: 15mg         Qty: 28  LOT: [id number]  Exp.Date: 8/21  Dosing instructions: 1 tab daily at supper  The patient has been instructed regarding the correct time, dose, and frequency of taking this medication, including desired effects and most common side effects.   Valeda Malm 11:55 AM 05/28/2019

## 2019-05-28 NOTE — Patient Instructions (Signed)
Take Ativan 0.5mg  (1 tab) daily as needed for nausea  Your physician recommends that you schedule a follow-up appointment in: 3 months with Dr Aundra Dubin  At the [facility], you and your health needs are our priority. As part of our continuing mission to provide you with exceptional heart care, we have created designated Provider Care Teams. These Care Teams include your primary Cardiologist (physician) and Advanced Practice Providers (APPs- Physician Assistants and Nurse Practitioners) who all work together to provide you with the care you need, when you need it.   You may see any of the following providers on your designated Care Team at your next follow up: Marland Kitchen Dr Glori Bickers . Dr [name] . Darrick Grinder, NP   Please be sure to bring in all your medications bottles to every appointment.

## 2019-06-05 DIAGNOSIS — I480 Paroxysmal atrial fibrillation: Secondary | ICD-10-CM | POA: Diagnosis not present

## 2019-06-05 DIAGNOSIS — M19079 Primary osteoarthritis, unspecified ankle and foot: Secondary | ICD-10-CM | POA: Diagnosis not present

## 2019-06-05 DIAGNOSIS — M8580 Other specified disorders of bone density and structure, unspecified site: Secondary | ICD-10-CM | POA: Diagnosis not present

## 2019-06-05 DIAGNOSIS — I5042 Chronic combined systolic (congestive) and diastolic (congestive) heart failure: Secondary | ICD-10-CM | POA: Diagnosis not present

## 2019-06-09 IMAGING — DX DG CHEST 2V
2 series · 2 of 2 positions shown · non-contrast
Comparison: One-view chest x-ray 09/05/2017

CLINICAL DATA: Cardiac device in situ.  AICD placement.

EXAM:
CHEST  2 VIEW

[x chest ap]
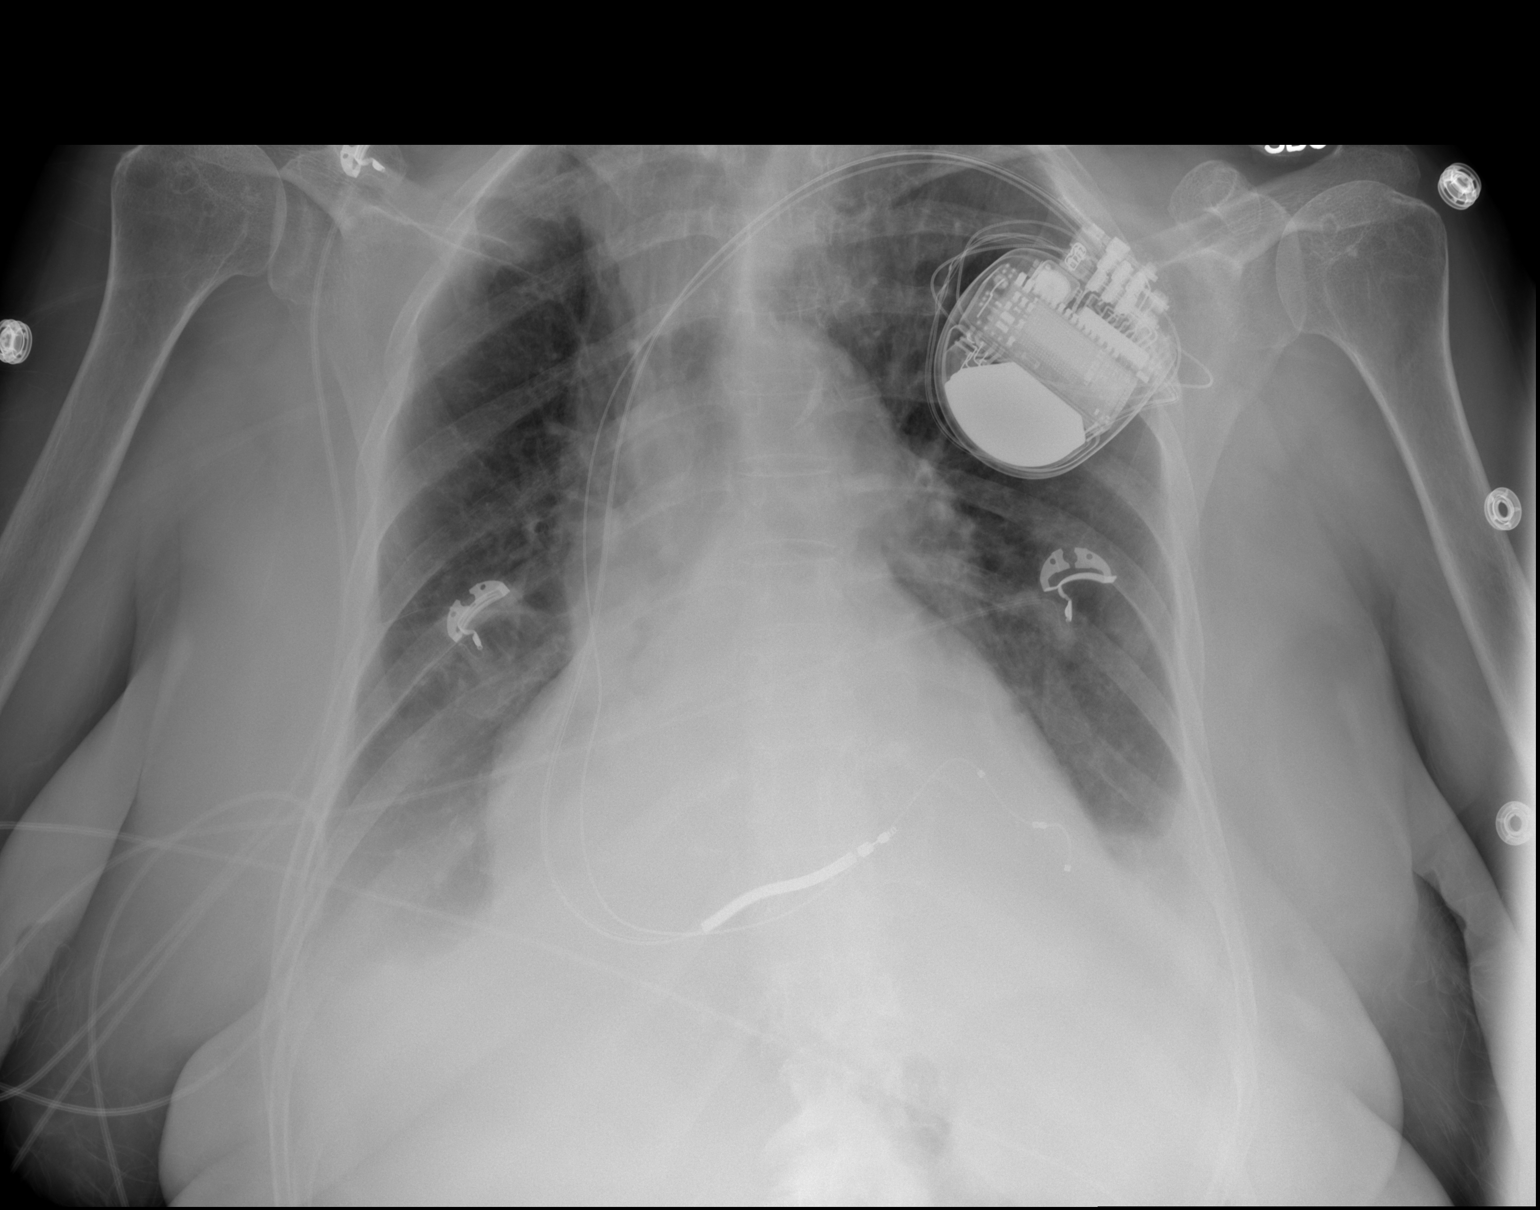

[w chest lat]
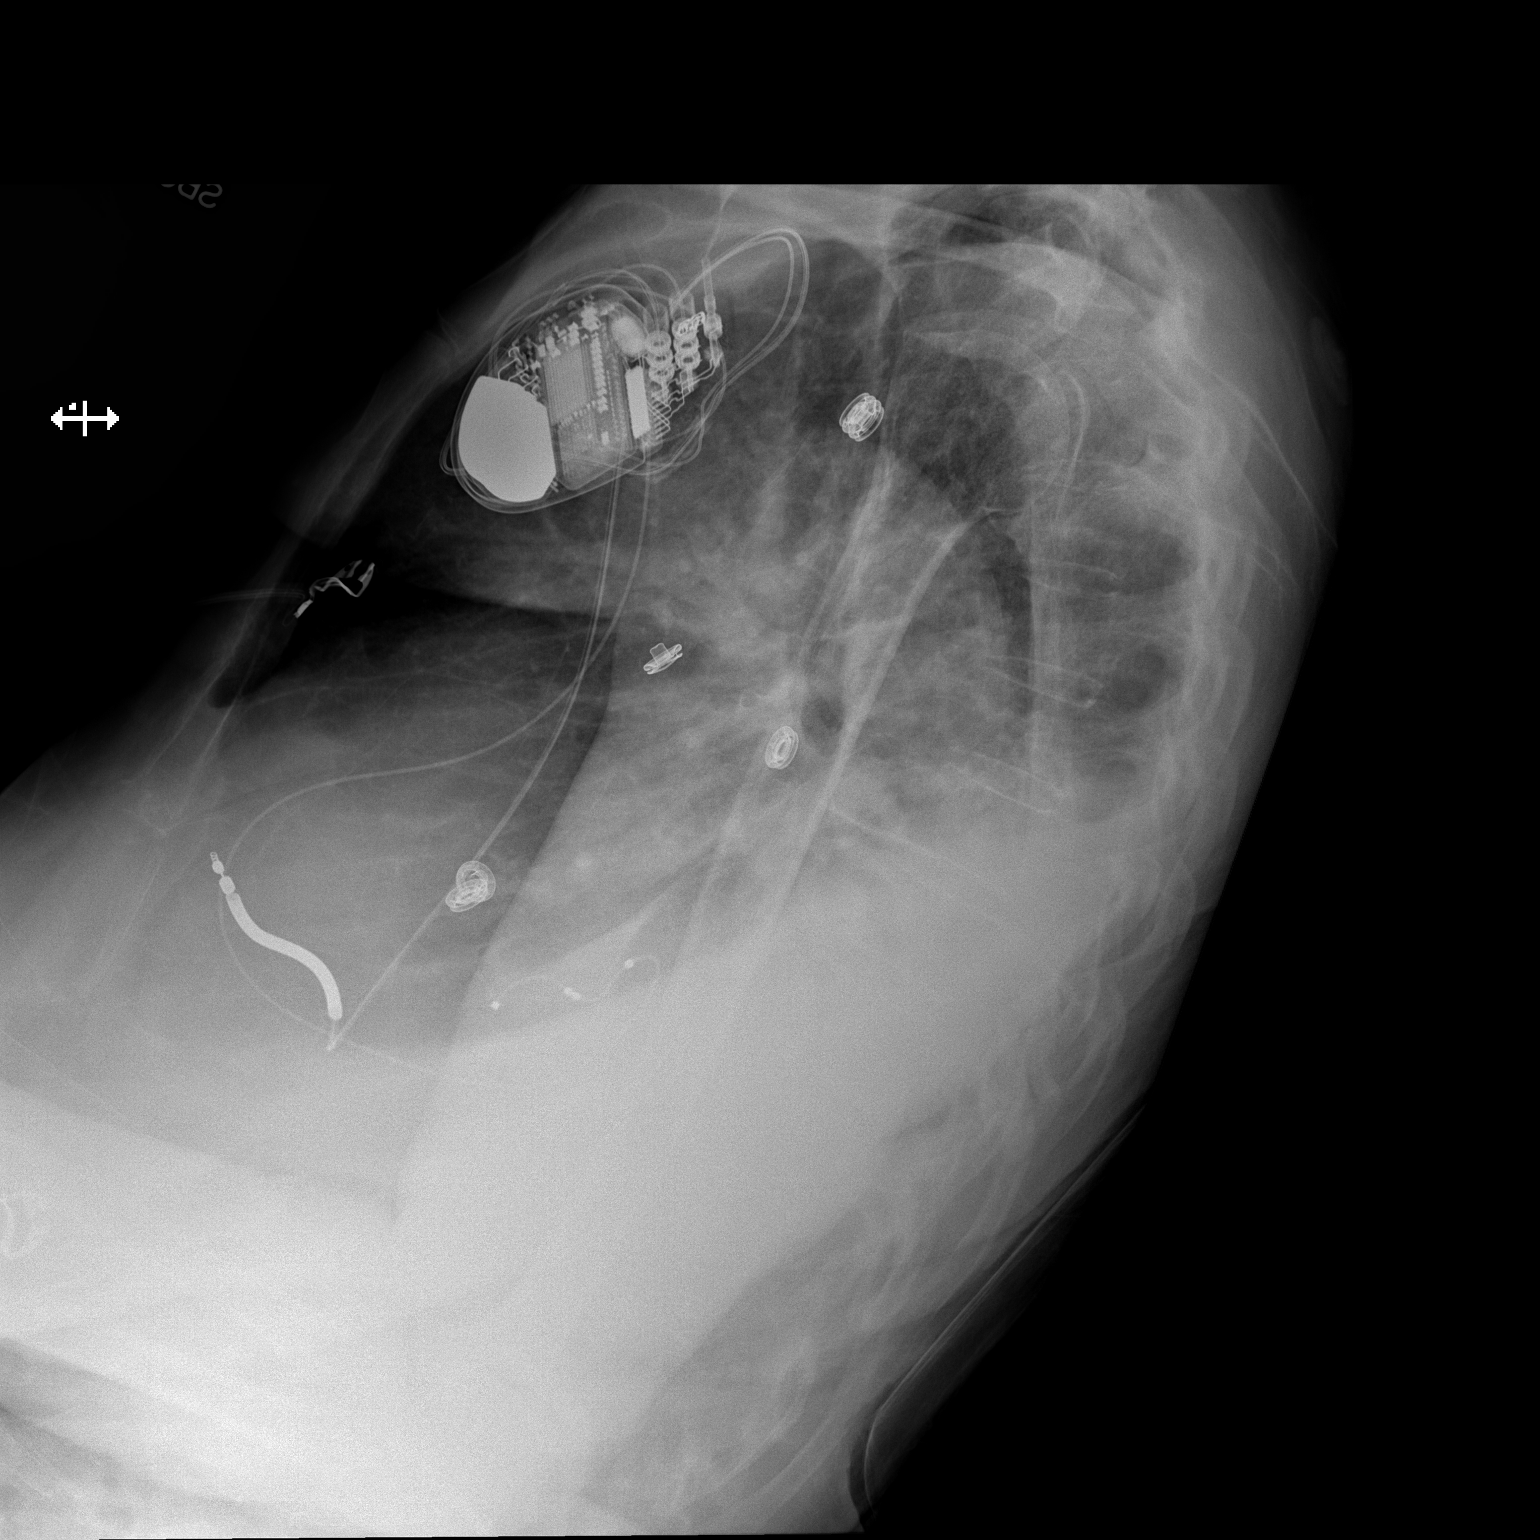

[2 of 2 positions shown; findings below may reference images not displayed]

FINDINGS: Heart is enlarged. Bilateral pleural effusions are again noted.
Bibasilar airspace disease likely reflects atelectasis. Overall
aeration is improved. Atherosclerotic calcifications are present at
the aortic arch. A pacemaker/AICD has been placed via a left
subclavian approach. Right ventricular and coronary sinus leads are
noted. There is no pneumothorax.
IMPRESSION: 1. Interval placement of pacemaker/AICD via a left subclavian
approach. No complications.
2. Improving aeration with persistent bilateral pleural effusions
and associated atelectasis.

## 2019-06-11 ENCOUNTER — Ambulatory Visit (INDEPENDENT_AMBULATORY_CARE_PROVIDER_SITE_OTHER): Payer: PPO | Admitting: *Deleted

## 2019-06-11 DIAGNOSIS — I428 Other cardiomyopathies: Secondary | ICD-10-CM

## 2019-06-16 ENCOUNTER — Telehealth: Payer: Self-pay

## 2019-06-16 ENCOUNTER — Other Ambulatory Visit (HOSPITAL_COMMUNITY): Payer: Self-pay | Admitting: Cardiology

## 2019-06-16 NOTE — Telephone Encounter (Signed)
The pt states states she had a "spell" where she was SOB, sweating, and her blood pressure was high. She took her medication and she felt better. I tried to get her to send a manual transmission with her home monitor but it gave her the error code 69. I gave her the number to [employer] tech support to get additional help.

## 2019-06-16 NOTE — Telephone Encounter (Signed)
Transmission received.

## 2019-06-16 NOTE — Telephone Encounter (Signed)
Transmission reviewed. Normal device function. Presenting rhythm BiVP @ 82bpm. No VT/VF episodes. BiVP 96.2%, effective 95.9%. VSRP 3.7%. OptiVol stable. Histograms appropriate.  Spoke with patient, relayed normal transmission information. Pt reports she is relieved that transmission is normal. Pt returned home from [facility] earlier today and suddenly felt extremely SOB, and weak. Someone brought her to her room in a wheelchair. Reports her BP was 150/81, HR felt irregular. Forgot to check her O2 sats. Felt diaphoretic, then had to have a BM. Pt took bisoprolol and PRN lorazepam, relaxed for a while, feels back at baseline now. She does not feel like she experienced any particularly stressful events today. Reports symptoms were similar to previous "spells." Advised I will route this message to Dr. Aundra Dubin to ensure no new recommendations. Pt in agreement with plan, no additional questions at this time.

## 2019-06-17 ENCOUNTER — Other Ambulatory Visit (HOSPITAL_COMMUNITY): Payer: Self-pay | Admitting: Cardiology

## 2019-06-17 LAB — CUP PACEART REMOTE DEVICE CHECK
Battery Remaining Longevity: 73 mo
Battery Voltage: 2.96 V
Brady Statistic AP VP Percent: 0 %
Brady Statistic AP VS Percent: 0 %
Brady Statistic AS VP Percent: 0 %
Brady Statistic AS VS Percent: 0 %
Brady Statistic RA Percent Paced: 0 %
Brady Statistic RV Percent Paced: 96.17 %
Date Time Interrogation Session: 20201005193248
HighPow Impedance: 65 Ohm
Implantable Lead Implant Date: 20181228
Implantable Lead Implant Date: 20181228
Implantable Lead Implant Date: 20181228
Implantable Lead Location: 753858
Implantable Lead Location: 753859
Implantable Lead Location: 753860
Implantable Lead Model: 4598
Implantable Lead Model: 5076
Implantable Pulse Generator Implant Date: 20181228
Lead Channel Impedance Value: 174.595
Lead Channel Impedance Value: 178.5 Ohm
Lead Channel Impedance Value: 178.5 Ohm
Lead Channel Impedance Value: 194.634
Lead Channel Impedance Value: 199.5 Ohm
Lead Channel Impedance Value: 285 Ohm
Lead Channel Impedance Value: 323 Ohm
Lead Channel Impedance Value: 380 Ohm
Lead Channel Impedance Value: 399 Ohm
Lead Channel Impedance Value: 399 Ohm
Lead Channel Impedance Value: 399 Ohm
Lead Channel Impedance Value: 4047 Ohm
Lead Channel Impedance Value: 494 Ohm
Lead Channel Impedance Value: 551 Ohm
Lead Channel Impedance Value: 570 Ohm
Lead Channel Impedance Value: 665 Ohm
Lead Channel Impedance Value: 665 Ohm
Lead Channel Impedance Value: 665 Ohm
Lead Channel Pacing Threshold Amplitude: 0.375 V
Lead Channel Pacing Threshold Amplitude: 0.625 V
Lead Channel Pacing Threshold Pulse Width: 0.4 ms
Lead Channel Pacing Threshold Pulse Width: 0.4 ms
Lead Channel Sensing Intrinsic Amplitude: 22.25 mV
Lead Channel Sensing Intrinsic Amplitude: 22.25 mV
Lead Channel Setting Pacing Amplitude: 1 V
Lead Channel Setting Pacing Amplitude: 2.5 V
Lead Channel Setting Pacing Pulse Width: 0.4 ms
Lead Channel Setting Pacing Pulse Width: 0.4 ms
Lead Channel Setting Sensing Sensitivity: 0.3 mV

## 2019-06-18 ENCOUNTER — Encounter: Payer: Self-pay | Admitting: Cardiology

## 2019-06-18 NOTE — Progress Notes (Signed)
Remote ICD transmission.   

## 2019-06-19 ENCOUNTER — Other Ambulatory Visit: Payer: Self-pay

## 2019-07-02 DIAGNOSIS — I480 Paroxysmal atrial fibrillation: Secondary | ICD-10-CM | POA: Diagnosis not present

## 2019-07-02 DIAGNOSIS — M8580 Other specified disorders of bone density and structure, unspecified site: Secondary | ICD-10-CM | POA: Diagnosis not present

## 2019-07-02 DIAGNOSIS — Z23 Encounter for immunization: Secondary | ICD-10-CM | POA: Diagnosis not present

## 2019-07-02 DIAGNOSIS — I5042 Chronic combined systolic (congestive) and diastolic (congestive) heart failure: Secondary | ICD-10-CM | POA: Diagnosis not present

## 2019-07-02 DIAGNOSIS — E7800 Pure hypercholesterolemia, unspecified: Secondary | ICD-10-CM | POA: Diagnosis not present

## 2019-07-02 DIAGNOSIS — M19079 Primary osteoarthritis, unspecified ankle and foot: Secondary | ICD-10-CM | POA: Diagnosis not present

## 2019-07-07 ENCOUNTER — Ambulatory Visit (INDEPENDENT_AMBULATORY_CARE_PROVIDER_SITE_OTHER): Payer: PPO

## 2019-07-07 DIAGNOSIS — I5022 Chronic systolic (congestive) heart failure: Secondary | ICD-10-CM | POA: Diagnosis not present

## 2019-07-07 DIAGNOSIS — Z95810 Presence of automatic (implantable) cardiac defibrillator: Secondary | ICD-10-CM | POA: Diagnosis not present

## 2019-07-08 NOTE — Progress Notes (Signed)
EPIC Encounter for ICM Monitoring  Patient Name: Cheryl Vargas is a 84 y.o. female Date: 07/08/2019 Primary Care Physican: Lavone Orn, MD Primary Cardiologist:[place] Electrophysiologist:Allred Bi-V Pacing:96.3% 9/16/2020Weight:138.6lbs    Spoke with patient.Transmission reviewed.She is feeling fine at this time and has no complaints.  OptivolThoracic impedancenormal.  Prescribed: Furosemide20 mg take 1 tablet by mouth. Taking Furosemide Differently: She takes PRN based on weight.  Labs: 05/07/2019 Creatinine 1.00, BUN 24, Potassium 4.6, Sodium 138, GFR 53->60 05/06/2019 Creatinine 1.15, BUN 23, Potassium 3.7, Sodium 138, GFR 45-52  05/05/2019 Creatinine 1.15, BUN 24, Potassium 3.7, Sodium 137, GFR 45-52  02/25/2019 Creatinine 1.34, BUN 23, Potassium 3.8, Sodium 139, GFR 37-43  02/11/2019 Creatinine 1.24, BUN 18, Potassium 4.1, Sodium 138, GFR 41-47  A complete set of results can be found in Results Review.  Recommendations: No changes and encouraged to call if experiencing any fluid symptoms.  Follow-up plan: ICM clinic phone appointment on 08/18/2019.   91 day device clinic remote transmission 09/10/2019.  Office appt 08/28/2019 with Dr. Aundra Dubin.    Copy of ICM check sent to Dr. Rayann Heman.   3 month ICM trend: 07/07/2019    1 Year ICM trend:       Rosalene Billings, RN 07/08/2019 10:20 AM

## 2019-08-18 ENCOUNTER — Ambulatory Visit (INDEPENDENT_AMBULATORY_CARE_PROVIDER_SITE_OTHER): Payer: PPO

## 2019-08-18 DIAGNOSIS — Z95810 Presence of automatic (implantable) cardiac defibrillator: Secondary | ICD-10-CM | POA: Diagnosis not present

## 2019-08-18 DIAGNOSIS — I5022 Chronic systolic (congestive) heart failure: Secondary | ICD-10-CM

## 2019-08-18 NOTE — Progress Notes (Signed)
EPIC Encounter for ICM Monitoring  Patient Name: Cheryl Vargas is a 84 y.o. female Date: 08/18/2019 Primary Care Physican: Lavone Orn, MD Primary Cardiologist:[place] Electrophysiologist:Allred Bi-V Pacing:97.6% 12/7/2020Weight:138lbs    Spoke with patient and denies any fluid symptoms.  OptivolThoracic impedancenormal.  Prescribed: Furosemide20 mg take1tablet by mouth. Taking Furosemide Differently: She takes PRN based on weight.  Labs: 05/07/2019 Creatinine 1.00, BUN 24, Potassium 4.6, Sodium 138, GFR 53->60 05/06/2019 Creatinine 1.15, BUN 23, Potassium 3.7, Sodium 138, GFR 45-52  05/05/2019 Creatinine 1.15, BUN 24, Potassium 3.7, Sodium 137, GFR 45-52  02/25/2019 Creatinine 1.34, BUN 23, Potassium 3.8, Sodium 139, GFR 37-43  02/11/2019 Creatinine 1.24, BUN 18, Potassium 4.1, Sodium 138, GFR 41-47  A complete set of results can be found in Results Review.  Recommendations: No changes and encouraged to call if experiencing any fluid symptoms.  Follow-up plan: ICM clinic phone appointment on 09/22/2019.   91 day device clinic remote transmission 09/10/2019.  Office appt 08/28/2019 with Dr. Aundra Dubin.    Copy of ICM check sent to Dr. Rayann Heman.   3 month ICM trend: 08/18/2019    1 Year ICM trend:       Rosalene Billings, RN 08/18/2019 4:09 PM

## 2019-08-28 ENCOUNTER — Encounter (HOSPITAL_COMMUNITY): Payer: PPO | Admitting: Cardiology

## 2019-09-03 DIAGNOSIS — I480 Paroxysmal atrial fibrillation: Secondary | ICD-10-CM | POA: Diagnosis not present

## 2019-09-03 DIAGNOSIS — E7800 Pure hypercholesterolemia, unspecified: Secondary | ICD-10-CM | POA: Diagnosis not present

## 2019-09-03 DIAGNOSIS — M19079 Primary osteoarthritis, unspecified ankle and foot: Secondary | ICD-10-CM | POA: Diagnosis not present

## 2019-09-03 DIAGNOSIS — M8580 Other specified disorders of bone density and structure, unspecified site: Secondary | ICD-10-CM | POA: Diagnosis not present

## 2019-09-03 DIAGNOSIS — I5042 Chronic combined systolic (congestive) and diastolic (congestive) heart failure: Secondary | ICD-10-CM | POA: Diagnosis not present

## 2019-09-10 ENCOUNTER — Ambulatory Visit (INDEPENDENT_AMBULATORY_CARE_PROVIDER_SITE_OTHER): Payer: PPO | Admitting: *Deleted

## 2019-09-10 DIAGNOSIS — I428 Other cardiomyopathies: Secondary | ICD-10-CM | POA: Diagnosis not present

## 2019-09-10 LAB — CUP PACEART REMOTE DEVICE CHECK
Battery Remaining Longevity: 63 mo
Battery Voltage: 2.97 V
Brady Statistic AP VP Percent: 0 %
Brady Statistic AP VS Percent: 0 %
Brady Statistic AS VP Percent: 0 %
Brady Statistic AS VS Percent: 0 %
Brady Statistic RA Percent Paced: 0 %
Brady Statistic RV Percent Paced: 96.78 %
Date Time Interrogation Session: 20201230012303
HighPow Impedance: 60 Ohm
Implantable Lead Implant Date: 20181228
Implantable Lead Implant Date: 20181228
Implantable Lead Implant Date: 20181228
Implantable Lead Location: 753858
Implantable Lead Location: 753859
Implantable Lead Location: 753860
Implantable Lead Model: 4598
Implantable Lead Model: 5076
Implantable Pulse Generator Implant Date: 20181228
Lead Channel Impedance Value: 155.455
Lead Channel Impedance Value: 155.455
Lead Channel Impedance Value: 155.455
Lead Channel Impedance Value: 171 Ohm
Lead Channel Impedance Value: 171 Ohm
Lead Channel Impedance Value: 266 Ohm
Lead Channel Impedance Value: 285 Ohm
Lead Channel Impedance Value: 342 Ohm
Lead Channel Impedance Value: 342 Ohm
Lead Channel Impedance Value: 342 Ohm
Lead Channel Impedance Value: 380 Ohm
Lead Channel Impedance Value: 4047 Ohm
Lead Channel Impedance Value: 437 Ohm
Lead Channel Impedance Value: 513 Ohm
Lead Channel Impedance Value: 551 Ohm
Lead Channel Impedance Value: 570 Ohm
Lead Channel Impedance Value: 570 Ohm
Lead Channel Impedance Value: 608 Ohm
Lead Channel Pacing Threshold Amplitude: 0.375 V
Lead Channel Pacing Threshold Amplitude: 0.5 V
Lead Channel Pacing Threshold Pulse Width: 0.4 ms
Lead Channel Pacing Threshold Pulse Width: 0.4 ms
Lead Channel Sensing Intrinsic Amplitude: 14 mV
Lead Channel Sensing Intrinsic Amplitude: 14 mV
Lead Channel Setting Pacing Amplitude: 1 V
Lead Channel Setting Pacing Amplitude: 2.5 V
Lead Channel Setting Pacing Pulse Width: 0.4 ms
Lead Channel Setting Pacing Pulse Width: 0.4 ms
Lead Channel Setting Sensing Sensitivity: 0.3 mV

## 2019-09-15 ENCOUNTER — Other Ambulatory Visit (HOSPITAL_COMMUNITY): Payer: Self-pay | Admitting: *Deleted

## 2019-09-15 MED ORDER — BISOPROLOL FUMARATE 5 MG PO TABS: 2.5000 mg | ORAL_TABLET | Freq: Two times a day (BID) | ORAL | 3 refills | Status: DC

## 2019-09-16 ENCOUNTER — Other Ambulatory Visit (HOSPITAL_COMMUNITY): Payer: Self-pay | Admitting: *Deleted

## 2019-09-16 MED ORDER — LORAZEPAM 0.5 MG PO TABS: 0.5000 mg | ORAL_TABLET | Freq: Every day | ORAL | 0 refills | Status: DC | PRN

## 2019-09-22 ENCOUNTER — Ambulatory Visit (INDEPENDENT_AMBULATORY_CARE_PROVIDER_SITE_OTHER): Payer: PPO

## 2019-09-22 DIAGNOSIS — Z95810 Presence of automatic (implantable) cardiac defibrillator: Secondary | ICD-10-CM | POA: Diagnosis not present

## 2019-09-22 DIAGNOSIS — I5022 Chronic systolic (congestive) heart failure: Secondary | ICD-10-CM

## 2019-09-22 NOTE — Progress Notes (Signed)
EPIC Encounter for ICM Monitoring  Patient Name: Cheryl Vargas is a 85 y.o. female Date: 09/22/2019 Primary Care Physican: Lavone Orn, MD Primary Cardiologist:[place] Electrophysiologist:Allred Bi-V Pacing:96.4% 1/11/2021Weight:137.5lbs    Spoke with patient and denies any fluid symptoms.   Patient had "spell" in which she experienced sudden bowel movement, red face, could not get her breath but thinks it may be related to eating a food that was too rich.  She has not taken Furosemide in the last 2 days due to she had diarrhea.  OptivolThoracic impedancesuggesting possible fluid accumulation since 09/17/19.  Prescribed: Furosemide20 mg take1tablet by mouth.   Labs: 05/07/2019 Creatinine1.00, Laurann Montana, Potassium4.6, Sodium138, GFR53->60 05/06/2019 Creatinine1.15, BUN23, Potassium3.7, [id number], [id number]  05/05/2019 Creatinine1.15, BUN24, Potassium3.7, [id number], [id number]  02/25/2019 Creatinine1.34, BUN23, Potassium3.8, Sodium139, RTM21-11  02/11/2019 Creatinine1.24, BUN18, Potassium4.1, [id number], [id number] A complete set of results can be found in Results Review  Recommendations:  Advised to take Furosemide daily as prescribed.   Follow-up plan: ICM clinic phone appointment on 10/27/2019 since fluid levels will be rechecked on 1/19 at HF clinic.   91 day device clinic remote transmission 12/10/2019.  Office appt 09/30/2019 with Dr. Aundra Dubin.    Copy of ICM check sent to Dr. Rayann Heman and Dr Aundra Dubin.   3 month ICM trend: 09/22/2019    1 Year ICM trend:       Rosalene Billings, RN 09/22/2019 1:33 PM

## 2019-09-24 ENCOUNTER — Other Ambulatory Visit: Payer: Self-pay | Admitting: Cardiology

## 2019-09-24 MED ORDER — LORAZEPAM 0.5 MG PO TABS: 0.5000 mg | ORAL_TABLET | Freq: Every day | ORAL | 0 refills | Status: DC | PRN

## 2019-09-24 NOTE — Telephone Encounter (Signed)
This is a CHF pt 

## 2019-09-27 ENCOUNTER — Other Ambulatory Visit: Payer: Self-pay

## 2019-09-27 ENCOUNTER — Inpatient Hospital Stay (HOSPITAL_COMMUNITY)
Admission: EM | Admit: 2019-09-27 | Discharge: 2019-09-30 | DRG: 286 | Disposition: A | Discharge status: Home / Self Care | Payer: PPO | Attending: Cardiology | Admitting: Cardiology

## 2019-09-27 ENCOUNTER — Encounter (HOSPITAL_COMMUNITY): Payer: Self-pay | Admitting: Emergency Medicine

## 2019-09-27 ENCOUNTER — Emergency Department (HOSPITAL_COMMUNITY): Payer: PPO

## 2019-09-27 DIAGNOSIS — Z801 Family history of malignant neoplasm of trachea, bronchus and lung: Secondary | ICD-10-CM

## 2019-09-27 DIAGNOSIS — R0602 Shortness of breath: Secondary | ICD-10-CM | POA: Diagnosis not present

## 2019-09-27 DIAGNOSIS — I5043 Acute on chronic combined systolic (congestive) and diastolic (congestive) heart failure: Secondary | ICD-10-CM | POA: Diagnosis not present

## 2019-09-27 DIAGNOSIS — I130 Hypertensive heart and chronic kidney disease with heart failure and stage 1 through stage 4 chronic kidney disease, or unspecified chronic kidney disease: Secondary | ICD-10-CM | POA: Diagnosis present

## 2019-09-27 DIAGNOSIS — I472 Ventricular tachycardia, unspecified: Secondary | ICD-10-CM

## 2019-09-27 DIAGNOSIS — I428 Other cardiomyopathies: Secondary | ICD-10-CM | POA: Diagnosis not present

## 2019-09-27 DIAGNOSIS — Z20822 Contact with and (suspected) exposure to covid-19: Secondary | ICD-10-CM | POA: Diagnosis not present

## 2019-09-27 DIAGNOSIS — N183 Chronic kidney disease, stage 3 unspecified: Secondary | ICD-10-CM | POA: Diagnosis present

## 2019-09-27 DIAGNOSIS — Z79899 Other long term (current) drug therapy: Secondary | ICD-10-CM | POA: Diagnosis not present

## 2019-09-27 DIAGNOSIS — I088 Other rheumatic multiple valve diseases: Secondary | ICD-10-CM | POA: Diagnosis not present

## 2019-09-27 DIAGNOSIS — I491 Atrial premature depolarization: Secondary | ICD-10-CM | POA: Diagnosis not present

## 2019-09-27 DIAGNOSIS — E785 Hyperlipidemia, unspecified: Secondary | ICD-10-CM | POA: Diagnosis not present

## 2019-09-27 DIAGNOSIS — Z95810 Presence of automatic (implantable) cardiac defibrillator: Secondary | ICD-10-CM | POA: Diagnosis not present

## 2019-09-27 DIAGNOSIS — I4821 Permanent atrial fibrillation: Secondary | ICD-10-CM | POA: Diagnosis not present

## 2019-09-27 DIAGNOSIS — Z8249 Family history of ischemic heart disease and other diseases of the circulatory system: Secondary | ICD-10-CM | POA: Diagnosis not present

## 2019-09-27 DIAGNOSIS — Z03818 Encounter for observation for suspected exposure to other biological agents ruled out: Secondary | ICD-10-CM | POA: Diagnosis not present

## 2019-09-27 DIAGNOSIS — I5023 Acute on chronic systolic (congestive) heart failure: Secondary | ICD-10-CM | POA: Diagnosis not present

## 2019-09-27 DIAGNOSIS — Z96642 Presence of left artificial hip joint: Secondary | ICD-10-CM | POA: Diagnosis not present

## 2019-09-27 DIAGNOSIS — I248 Other forms of acute ischemic heart disease: Secondary | ICD-10-CM | POA: Diagnosis present

## 2019-09-27 DIAGNOSIS — Z8674 Personal history of sudden cardiac arrest: Secondary | ICD-10-CM

## 2019-09-27 DIAGNOSIS — Z7901 Long term (current) use of anticoagulants: Secondary | ICD-10-CM | POA: Diagnosis not present

## 2019-09-27 DIAGNOSIS — R0902 Hypoxemia: Secondary | ICD-10-CM | POA: Diagnosis not present

## 2019-09-27 DIAGNOSIS — N179 Acute kidney failure, unspecified: Secondary | ICD-10-CM | POA: Diagnosis not present

## 2019-09-27 DIAGNOSIS — I493 Ventricular premature depolarization: Secondary | ICD-10-CM | POA: Diagnosis not present

## 2019-09-27 DIAGNOSIS — I361 Nonrheumatic tricuspid (valve) insufficiency: Secondary | ICD-10-CM | POA: Diagnosis not present

## 2019-09-27 DIAGNOSIS — I5022 Chronic systolic (congestive) heart failure: Secondary | ICD-10-CM | POA: Diagnosis not present

## 2019-09-27 DIAGNOSIS — R110 Nausea: Secondary | ICD-10-CM | POA: Diagnosis not present

## 2019-09-27 DIAGNOSIS — Z888 Allergy status to other drugs, medicaments and biological substances status: Secondary | ICD-10-CM

## 2019-09-27 DIAGNOSIS — I509 Heart failure, unspecified: Secondary | ICD-10-CM | POA: Diagnosis not present

## 2019-09-27 DIAGNOSIS — I340 Nonrheumatic mitral (valve) insufficiency: Secondary | ICD-10-CM | POA: Diagnosis not present

## 2019-09-27 LAB — CBC WITH DIFFERENTIAL/PLATELET
Abs Immature Granulocytes: 0.04 10*3/uL (ref 0.00–0.07)
Basophils Absolute: 0 10*3/uL (ref 0.0–0.1)
Basophils Relative: 1 %
Eosinophils Absolute: 0.1 10*3/uL (ref 0.0–0.5)
Eosinophils Relative: 1 %
HCT: 47.5 % — ABNORMAL HIGH (ref 36.0–46.0)
Hemoglobin: 16 g/dL — ABNORMAL HIGH (ref 12.0–15.0)
Immature Granulocytes: 1 %
Lymphocytes Relative: 14 %
Lymphs Abs: 1.1 10*3/uL (ref 0.7–4.0)
MCH: 32.1 pg (ref 26.0–34.0)
MCHC: 33.7 g/dL (ref 30.0–36.0)
MCV: 95.4 fL (ref 80.0–100.0)
Monocytes Absolute: 0.6 10*3/uL (ref 0.1–1.0)
Monocytes Relative: 7 %
Neutro Abs: 5.9 10*3/uL (ref 1.7–7.7)
Neutrophils Relative %: 76 %
Platelets: 185 10*3/uL (ref 150–400)
RBC: 4.98 MIL/uL (ref 3.87–5.11)
RDW: 12.9 % (ref 11.5–15.5)
WBC: 7.7 10*3/uL (ref 4.0–10.5)
nRBC: 0 % (ref 0.0–0.2)

## 2019-09-27 LAB — COMPREHENSIVE METABOLIC PANEL
ALT: 48 U/L — ABNORMAL HIGH (ref 0–44)
AST: 62 U/L — ABNORMAL HIGH (ref 15–41)
Albumin: 3.5 g/dL (ref 3.5–5.0)
Alkaline Phosphatase: 64 U/L (ref 38–126)
Anion gap: 12 (ref 5–15)
BUN: 33 mg/dL — ABNORMAL HIGH (ref 8–23)
CO2: 23 mmol/L (ref 22–32)
Calcium: 8.6 mg/dL — ABNORMAL LOW (ref 8.9–10.3)
Chloride: 102 mmol/L (ref 98–111)
Creatinine, Ser: 1.46 mg/dL — ABNORMAL HIGH (ref 0.44–1.00)
GFR calc Af Amer: 38 mL/min — ABNORMAL LOW (ref >60)
GFR calc non Af Amer: 33 mL/min — ABNORMAL LOW (ref >60)
Glucose, Bld: 148 mg/dL — ABNORMAL HIGH (ref 70–99)
Potassium: 4.1 mmol/L (ref 3.5–5.1)
Sodium: 137 mmol/L (ref 135–145)
Total Bilirubin: 1 mg/dL (ref 0.3–1.2)
Total Protein: 6.7 g/dL (ref 6.5–8.1)

## 2019-09-27 LAB — MAGNESIUM: Magnesium: 2.1 mg/dL (ref 1.7–2.4)

## 2019-09-27 LAB — TROPONIN I (HIGH SENSITIVITY)
Troponin I (High Sensitivity): 33 ng/L — ABNORMAL HIGH (ref <18)
Troponin I (High Sensitivity): 51 ng/L — ABNORMAL HIGH (ref <18)

## 2019-09-27 LAB — BRAIN NATRIURETIC PEPTIDE: B Natriuretic Peptide: 1032.7 pg/mL — ABNORMAL HIGH (ref 0.0–100.0)

## 2019-09-27 MED ORDER — FAMOTIDINE 20 MG PO TABS
20.0000 mg | ORAL_TABLET | Freq: Every day | ORAL | Status: DC
  Administered 2019-09-28 – 2019-09-30 (×3): 20 mg via ORAL
  Filled 2019-09-27 (×3): qty 1

## 2019-09-27 MED ORDER — BISOPROLOL FUMARATE 5 MG PO TABS
2.5000 mg | ORAL_TABLET | Freq: Two times a day (BID) | ORAL | Status: DC
  Administered 2019-09-28 – 2019-09-30 (×4): 2.5 mg via ORAL
  Filled 2019-09-27: qty 0.5
  Filled 2019-09-27 (×5): qty 1

## 2019-09-27 MED ORDER — ZOLPIDEM TARTRATE 5 MG PO TABS
5.0000 mg | ORAL_TABLET | Freq: Every day | ORAL | Status: DC
  Administered 2019-09-27: 23:00:00 5 mg via ORAL
  Filled 2019-09-27: qty 1

## 2019-09-27 MED ORDER — ACETAMINOPHEN 500 MG PO TABS: 500.0000 mg | ORAL_TABLET | Freq: Four times a day (QID) | ORAL | Status: DC | PRN

## 2019-09-27 MED ORDER — LOSARTAN POTASSIUM 25 MG PO TABS
12.5000 mg | ORAL_TABLET | Freq: Every day | ORAL | Status: DC
  Administered 2019-09-27 – 2019-09-28 (×2): 12.5 mg via ORAL
  Filled 2019-09-27: qty 0.5
  Filled 2019-09-27: qty 1
  Filled 2019-09-27: qty 0.5
  Filled 2019-09-27: qty 1

## 2019-09-27 MED ORDER — AMIODARONE HCL 200 MG PO TABS
400.0000 mg | ORAL_TABLET | Freq: Two times a day (BID) | ORAL | Status: DC
  Administered 2019-09-28: 02:00:00 400 mg via ORAL
  Filled 2019-09-27: qty 2

## 2019-09-27 MED ORDER — SODIUM CHLORIDE 0.9 % IV SOLN: 250.0000 mL | INTRAVENOUS | Status: DC | PRN

## 2019-09-27 MED ORDER — LORAZEPAM 0.5 MG PO TABS
0.5000 mg | ORAL_TABLET | Freq: Every day | ORAL | Status: DC | PRN
  Administered 2019-09-28: 15:00:00 0.5 mg via ORAL
  Filled 2019-09-27: qty 1

## 2019-09-27 MED ORDER — SODIUM CHLORIDE 0.9% FLUSH
3.0000 mL | Freq: Two times a day (BID) | INTRAVENOUS | Status: DC
  Administered 2019-09-27 – 2019-09-30 (×3): 3 mL via INTRAVENOUS

## 2019-09-27 MED ORDER — ACETAMINOPHEN 325 MG PO TABS: 650.0000 mg | ORAL_TABLET | ORAL | Status: DC | PRN

## 2019-09-27 MED ORDER — AMIODARONE HCL 200 MG PO TABS
200.0000 mg | ORAL_TABLET | Freq: Once | ORAL | Status: AC
  Administered 2019-09-27: 23:00:00 200 mg via ORAL
  Filled 2019-09-27: qty 1

## 2019-09-27 MED ORDER — DOCUSATE SODIUM 100 MG PO CAPS: 100.0000 mg | ORAL_CAPSULE | Freq: Every day | ORAL | Status: DC | PRN

## 2019-09-27 MED ORDER — SPIRONOLACTONE 25 MG PO TABS
25.0000 mg | ORAL_TABLET | Freq: Every day | ORAL | Status: DC
  Filled 2019-09-27 (×2): qty 1

## 2019-09-27 MED ORDER — FUROSEMIDE 20 MG PO TABS: 20.0000 mg | ORAL_TABLET | Freq: Every day | ORAL | Status: DC

## 2019-09-27 MED ORDER — ZOLPIDEM TARTRATE 5 MG PO TABS
5.0000 mg | ORAL_TABLET | Freq: Every evening | ORAL | Status: DC | PRN
  Administered 2019-09-28 – 2019-09-29 (×2): 5 mg via ORAL
  Filled 2019-09-27 (×2): qty 1

## 2019-09-27 MED ORDER — SODIUM CHLORIDE 0.9% FLUSH: 3.0000 mL | INTRAVENOUS | Status: DC | PRN

## 2019-09-27 MED ORDER — AMIODARONE HCL 200 MG PO TABS: 400.0000 mg | ORAL_TABLET | Freq: Three times a day (TID) | ORAL | Status: DC

## 2019-09-27 MED ORDER — SPIRONOLACTONE 12.5 MG HALF TABLET: 12.5000 mg | ORAL_TABLET | Freq: Every day | ORAL | Status: DC

## 2019-09-27 NOTE — ED Triage Notes (Signed)
Pt arrives via EMS from home where she was up getting dressed when she felt her defibrillator fire.  Endorses weakness and nausea. Denies any CP or falls

## 2019-09-27 NOTE — ED Notes (Signed)
Cardiology at bedside.

## 2019-09-27 NOTE — ED Provider Notes (Signed)
[facility] EMERGENCY DEPARTMENT Provider Note   CSN: [id number] Arrival date & time: 09/27/19  1656     History Chief Complaint  Patient presents with  . Pacemaker Problem    Cheryl Vargas is a 85 y.o. female.  HPI   This patient is an 85 year old female, Cheryl Vargas is followed by the cardiology service both Dr. Rayann Heman with electrophysiology and Dr. Algernon Huxley, Cheryl Vargas has a known history of congestive heart failure and a nonischemic cardiomyopathy with nonobstructive coronaries.  Cheryl Vargas has persistent atrial fibrillation and has had ventricular tachycardia in the past.  Approximately 2 years ago Cheryl Vargas had a defibrillator pacemaker that was placed and since that time is done very well though Cheryl Vargas does note that from time to time Cheryl Vargas becomes very weak and lightheaded and then this will pass.  Cheryl Vargas reports that approximately 1 week ago Cheryl Vargas started to have some loose stools with a feeling of urgency to have a bowel movement, this seems to come and go as well but did occur today.  Cheryl Vargas reports that Cheryl Vargas did have some loose stools.  Later in the day Cheryl Vargas again felt some lightheaded feeling and then noted that her pacemaker defibrillator fired.  Cheryl Vargas felt a thump in her chest like Cheryl Vargas was being kicked, it then went away, Cheryl Vargas called the paramedics who immediately transported her to the hospital.  They noted that her heart rate was regular with occasional PVCs, blood pressure was reassuring and her oxygen levels were normal.  Cheryl Vargas was placed on 2 L by nasal cannula and cardiac monitoring was initiated.  The patient reports Cheryl Vargas has not had Cheryl trouble with her Medtronic pacemaker defibrillator since insertion 2 years ago.  Past Medical History:  Diagnosis Date  . Arthritis   . Chronic combined systolic (congestive) and diastolic (congestive) heart failure ([place])   . Mitral regurgitation   . Non-ischemic cardiomyopathy ([place])   . Persistent atrial fibrillation   . Ventricular tachycardia [facility])      Patient Active Problem List   Diagnosis Date Noted  . Near syncope 05/05/2019  . Hypercholesterolemia without hypertriglyceridemia 02/13/2018  . Syncope 01/21/2018  . Constipation 01/21/2018  . CHF exacerbation ([place]) 12/18/2017  . CKD (chronic kidney disease), stage III 12/16/2017  . SOB (shortness of breath)   . Abdominal pain 09/18/2017  . CHF (congestive heart failure) ([place]) 09/17/2017  . Pulmonary edema   . Cardiac device in situ   . Ventricular tachycardia ([place])   . Cardiac arrest ([place])   . Systolic and diastolic CHF, acute ([place])   . Severe mitral valve regurgitation   . A-fib ([place]) 09/03/2017  . Mitral regurgitation   . Acute on chronic systolic heart failure ([place])   . Non-ischemic cardiomyopathy ([place])   . CHF (congestive heart failure), NYHA class IV ([place]) 04/24/2016  . Dizziness 12/01/2015  . Hypertension 12/01/2015  . Insomnia 12/01/2015  . Paroxysmal atrial fibrillation ([facility])   . Cardiac arrhythmia 10/20/2014  . Systolic dysfunction 93/90/3009  . Hypoventilation, idiopathic 10/20/2014  . Other fatigue 10/20/2014  . Closed left hip fracture ([place]) 03/17/2013  . Cardiomegaly 03/17/2013  . Frequent PVCs 03/17/2013  . Hyperlipidemia 03/17/2013    Past Surgical History:  Procedure Laterality Date  . AV NODE ABLATION N/A 09/07/2017   Procedure: AV NODE ABLATION;  Surgeon: [name], MD;  Location: [facility] CV LAB;  Service: Cardiovascular;  Laterality: N/A;  . BIV ICD INSERTION CRT-D N/A 09/07/2017   Procedure: BIV ICD INSERTION CRT-D;  Surgeon:  [name], MD;  Location: [facility] CV LAB;  Service: Cardiovascular;  Laterality: N/A;  . BUNIONECTOMY    . CARDIAC CATHETERIZATION     in 2004 at [facility]. "Insignificant blockage" per patient  . CARDIAC CATHETERIZATION N/A 04/25/2016   Procedure: Left Heart Cath and Coronary Angiography;  Surgeon: Nelva Bush, MD;  Location: [facility] CV LAB;  Service: Cardiovascular;  Laterality: N/A;  .  CARDIOVERSION N/A 06/29/2017   Procedure: CARDIOVERSION;  Surgeon: Larey Dresser, MD;  Location: [facility] ENDOSCOPY;  Service: Cardiovascular;  Laterality: N/A;  . CARDIOVERSION N/A 08/28/2017   Procedure: CARDIOVERSION;  Surgeon: Larey Dresser, MD;  Location: [facility] ENDOSCOPY;  Service: Cardiovascular;  Laterality: N/A;  . SHOULDER SURGERY     closed reduction  . TEE WITHOUT CARDIOVERSION N/A 06/29/2017   Procedure: TRANSESOPHAGEAL ECHOCARDIOGRAM (TEE);  Surgeon: Larey Dresser, MD;  Location: [facility] ENDOSCOPY;  Service: Cardiovascular;  Laterality: N/A;  . TONSILLECTOMY    . TOTAL HIP ARTHROPLASTY Left 03/18/2013   Procedure: TOTAL HIP ARTHROPLASTY ANTERIOR APPROACH;  Surgeon: Mauri Pole, MD;  Location: [facility] ORS;  Service: Orthopedics;  Laterality: Left;  . TUBAL LIGATION       OB History   No obstetric history on file.     Family History  Problem Relation Age of Onset  . Lung cancer Mother   . Heart attack Father     Social History   Tobacco Use  . Smoking status: Never Smoker  . Smokeless tobacco: Never Used  Substance Use Topics  . Alcohol use: Yes    Alcohol/week: 3.0 standard drinks    Types: 3 Glasses of wine per week    Comment: per week  . Drug use: No    Home Medications Prior to Admission medications   Medication Sig Start Date End Date Taking? Authorizing Provider  acetaminophen (TYLENOL) 500 MG tablet Take 500 mg by mouth every 6 (six) hours as needed for headache.    [provider]  bisoprolol (ZEBETA) 5 MG tablet Take 0.5 tablets (2.5 mg total) by mouth 2 (two) times daily. 09/15/19   Larey Dresser, MD  docusate sodium (COLACE) 100 MG capsule Take 100 mg by mouth daily as needed for mild constipation.    [provider]  famotidine (PEPCID) 20 MG tablet Take 1 tablet (20 mg total) by mouth daily. 05/08/19 06/07/19  Barb Merino, MD  fluticasone (FLONASE) 50 MCG/ACT nasal spray Place 1 spray into both nostrils daily as needed for allergies or  rhinitis.     [provider]  furosemide (LASIX) 20 MG tablet Take 1 tablet (20 mg total) by mouth daily. 02/11/19   Larey Dresser, MD  LORazepam (ATIVAN) 0.5 MG tablet Take 1 tablet (0.5 mg total) by mouth daily as needed (nausea). 09/24/19   Larey Dresser, MD  losartan (COZAAR) 25 MG tablet TAKE 1/2 TABLET BY MOUTH AT BEDTIME 06/17/19   Larey Dresser, MD  Rivaroxaban (XARELTO) 15 MG TABS tablet Take 15 mg by mouth daily with supper.    [provider]  spironolactone (ALDACTONE) 25 MG tablet TAKE 1 TABLET (25 MG TOTAL) BY MOUTH DAILY. 06/16/19   Bensimhon, Shaune Pascal, MD  zolpidem (AMBIEN) 5 MG tablet Take 1 tablet (5 mg total) by mouth at bedtime. 09/20/17   Mariel Aloe, MD    Allergies    Sotalol, Alendronate sodium, Amiodarone, Ciprofloxacin, Compazine [prochlorperazine], Coreg [carvedilol], and Metoprolol  Review of Systems   Review of Systems  All  other systems reviewed and are negative.   Physical Exam Updated Vital Signs Pulse 88   Temp 97.7 F (36.5 C) (Oral)   Resp 18   Ht 1.651 m (5\' 5" )   Wt 62.6 kg   SpO2 100%   BMI 22.96 kg/m   Physical Exam Vitals and nursing note reviewed.  Constitutional:      General: Cheryl Vargas is not in acute distress.    Appearance: Cheryl Vargas is well-developed.  HENT:     Head: Normocephalic and atraumatic.     Mouth/Throat:     Pharynx: No oropharyngeal exudate.  Eyes:     General: No scleral icterus.       Right eye: No discharge.        Left eye: No discharge.     Conjunctiva/sclera: Conjunctivae normal.     Pupils: Pupils are equal, round, and reactive to light.  Neck:     Thyroid: No thyromegaly.     Vascular: No JVD.  Cardiovascular:     Rate and Rhythm: Normal rate and regular rhythm.     Heart sounds: Normal heart sounds. No murmur. No friction rub. No gallop.   Pulmonary:     Effort: Pulmonary effort is normal. No respiratory distress.     Breath sounds: Normal breath sounds. No wheezing or rales.   Abdominal:     General: Bowel sounds are normal. There is no distension.     Palpations: Abdomen is soft. There is no mass.     Tenderness: There is no abdominal tenderness.  Musculoskeletal:        General: No tenderness. Normal range of motion.     Cervical back: Normal range of motion and neck supple.  Lymphadenopathy:     Cervical: No cervical adenopathy.  Skin:    General: Skin is warm and dry.     Findings: No erythema or rash.  Neurological:     Mental Status: Cheryl Vargas is alert.     Coordination: Coordination normal.  Psychiatric:        Behavior: Behavior normal.     ED Results / Procedures / Treatments   Labs (all labs ordered are listed, but only abnormal results are displayed) Labs Reviewed  CBC WITH DIFFERENTIAL/PLATELET - Abnormal; Notable for the following components:      Result Value   Hemoglobin 16.0 (*)    HCT 47.5 (*)    All other components within normal limits  COMPREHENSIVE METABOLIC PANEL - Abnormal; Notable for the following components:   Glucose, Bld 148 (*)    BUN 33 (*)    Creatinine, Ser 1.46 (*)    Calcium 8.6 (*)    AST 62 (*)    ALT 48 (*)    GFR calc non Af Amer 33 (*)    GFR calc Af Amer 38 (*)    All other components within normal limits  BRAIN NATRIURETIC PEPTIDE - Abnormal; Notable for the following components:   B Natriuretic Peptide 1,032.7 (*)    All other components within normal limits  TROPONIN I (HIGH SENSITIVITY) - Abnormal; Notable for the following components:   Troponin I (High Sensitivity) 33 (*)    All other components within normal limits  MAGNESIUM  TROPONIN I (HIGH SENSITIVITY)    EKG EKG Interpretation  Date/Time:  Saturday September 27 2019 17:00:35 EST Ventricular Rate:  83 PR Interval:    QRS Duration: 140 QT Interval:  437 QTC Calculation: 514 R Axis:   165 Text Interpretation: Atrial fibrillation with  paced rhthm and PVC Paired ventricular premature complexes Nonspecific intraventricular conduction delay  Anterolateral infarct, age indeterminate Confirmed by Noemi Chapel [phone]) on 09/27/2019 5:06:16 PM   Radiology DG Chest Port 1 View  Result Date: 09/27/2019 CLINICAL DATA:  85 year old female with shortness of breath. EXAM: PORTABLE CHEST 1 VIEW COMPARISON:  Chest radiograph dated 05/05/2019 FINDINGS: Minimal left lung base atelectasis. No focal consolidation, pleural effusion or pneumothorax. Stable cardiomegaly. Atherosclerotic calcification of the aorta. Left pectoral AICD device. No acute osseous pathology. IMPRESSION: 1. Left lung base atelectasis/scarring.  No new consolidation. 2. Cardiomegaly. Electronically Signed   By: Anner Crete M.D.   On: 09/27/2019 18:29    Procedures .Critical Care Performed by: Noemi Chapel, MD Authorized by: Noemi Chapel, MD   Critical care provider statement:    Critical care time (minutes):  35   Critical care time was exclusive of:  Separately billable procedures and treating other patients and teaching time   Critical care was necessary to treat or prevent imminent or life-threatening deterioration of the following conditions:  Cardiac failure   Critical care was time spent personally by me on the following activities:  Blood draw for specimens, development of treatment plan with patient or surrogate, discussions with consultants, evaluation of patient's response to treatment, examination of patient, obtaining history from patient or surrogate, ordering and performing treatments and interventions, ordering and review of laboratory studies, ordering and review of radiographic studies, pulse oximetry, re-evaluation of patient's condition and review of old charts Comments:         (including critical care time)  Medications Ordered in ED Medications - No data to display  ED Course  I have reviewed the triage vital signs and the nursing notes.  Pertinent labs & imaging results that were available during my care of the patient were reviewed by  me and considered in my medical decision making (see chart for details).  Clinical Course as of Sep 26 1852  Sat Sep 27, 2019  1716 I discussed the case with the Medtronic pacemaker interrogated who states that there was a prolonged case of ventricular tachycardia, ultimately the shock resolved it.  Cheryl Vargas has had multiple other cases of nonsustained ventricular tachycardia which I suspect is what Cheryl Vargas is correlating with her weakness and lightheadedness throughout the week   [BM]    Clinical Course User Index [BM] Noemi Chapel, MD   MDM Rules/Calculators/A&P                       The patient's exam is rather unremarkable, Cheryl Vargas has a heart rate of around 80 bpm, Cheryl Vargas is being paced, Cheryl Vargas has an occasional PVC.  Cheryl Vargas has had what sounds to be a likely ventricular arrhythmia that was stopped by the shock event, Cheryl Vargas will have her Medtronic defibrillator pacemaker interrogated, we will check lites to make sure Cheryl Vargas does not have hypokalemia hyponatremia or other electrolyte abnormalities.  Cheryl Vargas will be placed on a cardiac monitor and we will discuss with cardiology.  Cardiology has been paged, electrolytes are unremarkable, the creatinine is elevated to 1.46 with a BUN of 33, this is higher than her baseline.  Cheryl Vargas has normal blood counts and the chest x-ray shows some atelectasis and scarring but no new consolidations or abnormal findings otherwise.  The patient has not had Cheryl recurrent defibrillatory events  The patient had ventricular tachycardia, I have consulted with the cardiology fellow who will come to see the patient for admission.  Final Clinical  Impression(s) / ED Diagnoses Final diagnoses:  Ventricular tachycardia ([place])      Noemi Chapel, MD 09/27/19 1911

## 2019-09-27 NOTE — H&P (Signed)
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  History of Present Illness:   Briefly, Ms. Simmon was initially diagnosed with CHF in 2014. Her first admission in our system was in 2017 when she presented with increasing palpitations and fatigue. She underwent LHC with clearn cors. She was started on amiodarone for PVC supression, but this was ultimately stopped due to nausea (per patient she recalls only hypotension; does not recall having nausea with amiodarone though it is documented in the chart). She previously had difficult to control AF and ultimately underwent AV nodal ablation with CRT-D implantation in 08/2017.   Her most recent admission was in 04/2019 for presyncope described as a warm sensation with diaphoresis preceded by abdominal pain. No arrhythmia on device interrogation and mesenteric dopplers were normal. She was started on famotidine during that admission with some relief.  One week ago, she had an episode where (after carrying some bags of groceries) she developed a similar episode of warmth, diaphoresis, and tunnel like vision associated with a feeling of needing to move her bowels. She lied down to rest and took an Ativan with some relief. She had a similar episode earlier today that self resolved, but then while getting dressed the sensation returned. She lied down in bed and felt her defibrillator fire. EMS was activated and she was brought to the [facility].   Device interrogation (per report) performed by MDT rep noted episode of sustained VT today that resulted in appropriate ICD  shock. Also noted were multiple episodes of VT over the last week. Labs most notable for BNP 1032 (similar to 04/2019), Cr 1.46 (from 1.1 baseline), and slightly elevated AST/ALT 62/48.   On review of systems, she notes the above with no orthopnea, PND, or LE edema. Does usually carry fluid in her belly but does not feel bloated today. Continues to have ongoing issues with ?dyspepsia but does not feel that famotidine has helped. No cough, fevers, COVID exposure.    Past Medical History:  Diagnosis Date  . Arthritis   . Chronic combined systolic (congestive) and diastolic (congestive) heart failure ([place])   . Mitral regurgitation   . Non-ischemic cardiomyopathy ([place])   . Persistent atrial fibrillation ([place])   . Ventricular tachycardia [facility])     Past Surgical History:  Procedure Laterality Date  . AV NODE ABLATION N/A 09/07/2017   Procedure: AV NODE ABLATION;  Surgeon: [name], MD;  Location: [place] CV LAB;  Service: Cardiovascular;  Laterality: N/A;  . BIV ICD INSERTION CRT-D N/A 09/07/2017   Procedure: BIV ICD INSERTION CRT-D;  Surgeon: [name], MD;  Location: [place] CV LAB;  Service: Cardiovascular;  Laterality: N/A;  . BUNIONECTOMY    . CARDIAC CATHETERIZATION     in 2004 at [facility]. "Insignificant blockage" per patient  . CARDIAC CATHETERIZATION N/A 04/25/2016   Procedure: Left Heart Cath and Coronary Angiography;  Surgeon: Nelva Bush, MD;  Location: [place] CV LAB;  Service: Cardiovascular;  Laterality: N/A;  . CARDIOVERSION N/A 06/29/2017   Procedure: CARDIOVERSION;  Surgeon: Larey Dresser, MD;  Location: [facility] ENDOSCOPY;  Service:  Cardiovascular;  Laterality: N/A;  . CARDIOVERSION N/A 08/28/2017   Procedure: CARDIOVERSION;  Surgeon: Larey Dresser, MD;  Location: [facility] ENDOSCOPY;  Service: Cardiovascular;  Laterality: N/A;  . SHOULDER SURGERY     closed reduction  . TEE WITHOUT CARDIOVERSION N/A 06/29/2017   Procedure: TRANSESOPHAGEAL ECHOCARDIOGRAM  (TEE);  Surgeon: Larey Dresser, MD;  Location: [facility] ENDOSCOPY;  Service: Cardiovascular;  Laterality: N/A;  . TONSILLECTOMY    . TOTAL HIP ARTHROPLASTY Left 03/18/2013   Procedure: TOTAL HIP ARTHROPLASTY ANTERIOR APPROACH;  Surgeon: Mauri Pole, MD;  Location: [facility] ORS;  Service: Orthopedics;  Laterality: Left;  . TUBAL LIGATION       Medications Prior to Admission: Prior to Admission medications   Medication Sig Start Date End Date Taking? Authorizing Provider  acetaminophen (TYLENOL) 500 MG tablet Take 500 mg by mouth every 6 (six) hours as needed for headache.   Yes [provider]  bisoprolol (ZEBETA) 5 MG tablet Take 0.5 tablets (2.5 mg total) by mouth 2 (two) times daily. 09/15/19  Yes Larey Dresser, MD  docusate sodium (COLACE) 100 MG capsule Take 100 mg by mouth daily as needed for mild constipation.   Yes [provider]  famotidine (PEPCID) 20 MG tablet Take 1 tablet (20 mg total) by mouth daily. 05/08/19 09/27/19 Yes Ghimire, Dante Gang, MD  fluticasone (FLONASE) 50 MCG/ACT nasal spray Place 1 spray into both nostrils daily as needed for allergies or rhinitis.    Yes [provider]  furosemide (LASIX) 20 MG tablet Take 1 tablet (20 mg total) by mouth daily. 02/11/19  Yes Larey Dresser, MD  LORazepam (ATIVAN) 0.5 MG tablet Take 1 tablet (0.5 mg total) by mouth daily as needed (nausea). 09/24/19  Yes Larey Dresser, MD  losartan (COZAAR) 25 MG tablet TAKE 1/2 TABLET BY MOUTH AT BEDTIME 06/17/19  Yes Larey Dresser, MD  Rivaroxaban (XARELTO) 15 MG TABS tablet Take 15 mg by mouth daily with supper.   Yes [provider]  spironolactone (ALDACTONE) 25 MG tablet TAKE 1 TABLET (25 MG TOTAL) BY MOUTH DAILY. Patient taking differently: Take 12.5 mg by mouth at bedtime.  06/16/19  Yes Bensimhon, Shaune Pascal, MD  zolpidem (AMBIEN) 5 MG tablet Take 1 tablet (5 mg total) by mouth at bedtime. 09/20/17  Yes Mariel Aloe, MD     Allergies:    Allergies  Allergen  Reactions  . Sotalol Other (See Comments)    Prolonged QTc  . Alendronate Sodium Other (See Comments)    Arm pain  . Amiodarone Nausea Only  . Ciprofloxacin Other (See Comments)    Not effective  . Compazine [Prochlorperazine]   . Coreg [Carvedilol] Other (See Comments)    Fatigue   . Metoprolol Other (See Comments)    Profound fatigue    Social History:   Social History   Socioeconomic History  . Marital status: Widowed    Spouse name: Not on file  . Number of children: Not on file  . Years of education: Not on file  . Highest education level: Not on file  Occupational History  . Not on file  Tobacco Use  . Smoking status: Never Smoker  . Smokeless tobacco: Never Used  Substance and Sexual Activity  . Alcohol use: Yes    Alcohol/week: 3.0 standard drinks    Types: 3 Glasses of wine per week    Comment: per week  . Drug use: No  . Sexual activity: Not on file  Other  Topics Concern  . Not on file  Social History Narrative  . Not on file   Social Determinants of Health   Financial Resource Strain:   . Difficulty of Paying Living Expenses: Not on file  Food Insecurity:   . Worried About Charity fundraiser in the Last Year: Not on file  . Ran Out of Food in the Last Year: Not on file  Transportation Needs:   . Lack of Transportation (Medical): Not on file  . Lack of Transportation (Non-Medical): Not on file  Physical Activity:   . Days of Exercise per Week: Not on file  . Minutes of Exercise per Session: Not on file  Stress:   . Feeling of Stress : Not on file  Social Connections:   . Frequency of Communication with Friends and Family: Not on file  . Frequency of Social Gatherings with Friends and Family: Not on file  . Attends Religious Services: Not on file  . Active Member of Clubs or Organizations: Not on file  . Attends Archivist Meetings: Not on file  . Marital Status: Not on file  Intimate Partner Violence:   . Fear of Current or  Ex-Partner: Not on file  . Emotionally Abused: Not on file  . Physically Abused: Not on file  . Sexually Abused: Not on file    Family History:   The patient's family history includes Heart attack in her father; Lung cancer in her mother.    Review of Systems: [y] = yes, [ ]  = no     General: Weight gain [ ] ; Weight loss [ ] ; Anorexia [ ] ; Fatigue Blue.Reese ]; Fever [ ] ; Chills [ ] ; Weakness [ ]    Cardiac: Chest pain/pressure [ ] ; Resting SOB [ ] ; Exertional SOB [ ] ; Orthopnea [ ] ; Pedal Edema [ ] ; Palpitations [ ] ; Syncope [ ] ; Presyncope Blue.Reese ]; Paroxysmal nocturnal dyspnea[ ]    Pulmonary: Cough [ ] ; Wheezing[ ] ; Hemoptysis[ ] ; Sputum [ ] ; Snoring [ ]    GI: Vomiting[ ] ; Dysphagia[ ] ; Melena[ ] ; Hematochezia [ ] ; Heartburn[ ] ; Abdominal pain [ ] ; Constipation [ ] ; Diarrhea [ ] ; BRBPR [ ]    GU: Hematuria[ ] ; Dysuria [ ] ; Nocturia[ ]    Vascular: Pain in legs with walking [ ] ; Pain in feet with lying flat [ ] ; Non-healing sores [ ] ; Stroke [ ] ; TIA [ ] ; Slurred speech [ ] ;   Neuro: Headaches[ ] ; Vertigo[ ] ; Seizures[ ] ; Paresthesias[ ] ;Blurred vision Blue.Reese ]; Diplopia [ ] ; Vision changes [ ]    Ortho/Skin: Arthritis [ ] ; Joint pain [ ] ; Muscle pain [ ] ; Joint swelling [ ] ; Back Pain [ ] ; Rash [ ]    Psych: Depression[ ] ; Anxiety[ ]    Heme: Bleeding problems [ ] ; Clotting disorders [ ] ; Anemia [ ]    Endocrine: Diabetes [ ] ; Thyroid dysfunction[ ]   Physical Exam/Data:   Vitals:   09/27/19 1915 09/27/19 1930 09/27/19 1945 09/27/19 2000  BP: (!) 126/91 126/65 121/78 128/77  Pulse: 79 84 83 80  Resp: (!) 21 (!) 21 16 (!) 21  Temp:      TempSrc:      SpO2: 93% 95% 96% 96%  Weight:      Height:       No intake or output data in the 24 hours ending 09/27/19 2238 Filed Weights   09/27/19 1702  Weight: 62.6 kg   Body mass index is 22.96 kg/m.  General:  Thin caucasian woman, lying in bed comfortably.  HEENT: normal Lymph: no adenopathy Neck: JVD to clavicle at ~ 45 degrees.  Endocrine:   No thryomegaly Vascular: No carotid bruits; FA pulses 2+ bilaterally without bruits  Cardiac:  normal S1, S2; RRR; no murmurs.  Lungs:  clear to auscultation bilaterally, no wheezing, rhonchi or rales  Abd: soft, nontender, no hepatomegaly  Ext: no LE edema.  Musculoskeletal:  No deformities, BUE and BLE strength normal and equal Skin: warm and dry  Neuro:  CNs 2-12 intact, no focal abnormalities noted Psych:  Normal affect    EKG:  The ECG that was done demonstrated BIV pacing with multifocal PVCs.   Relevant CV Studies: TTE 04/2019: 1. The left ventricle has severely reduced systolic function, with an ejection fraction of 25-30%. The cavity size was normal. Left ventricular diastolic Doppler parameters are indeterminate. Diffuse hypokinesis with akinesis of the basal to mid  inferior and inferolateral walls.  2. The right ventricle has normal systolic function. The cavity was normal. There is no increase in right ventricular wall thickness.  3. Left atrial size was moderately dilated.  4. Right atrial size was mildly dilated.  5. Trivial pericardial effusion is present.  6. There is mild mitral annular calcification present. No evidence of mitral valve stenosis. Mild mitral regurgitation.  7. Tricuspid valve regurgitation is moderate-severe.  8. The aortic valve is tricuspid. Mild calcification of the aortic valve. Aortic valve regurgitation is mild by color flow Doppler. No stenosis of the aortic valve.  9. The aorta is normal unless otherwise noted. 10. The aortic root is normal in size and structure. 11. Normal IVC size. PA systolic pressure 34 mmHg. Nor=  Laboratory Data:  Chemistry Recent Labs  Lab 09/27/19 1708  NA 137  K 4.1  CL 102  CO2 23  GLUCOSE 148*  BUN 33*  CREATININE 1.46*  CALCIUM 8.6*  GFRNONAA 33*  GFRAA 38*  ANIONGAP 12    Recent Labs  Lab 09/27/19 1708  PROT 6.7  ALBUMIN 3.5  AST 62*  ALT 48*  ALKPHOS 64  BILITOT 1.0   Hematology Recent  Labs  Lab 09/27/19 1708  WBC 7.7  RBC 4.98  HGB 16.0*  HCT 47.5*  MCV 95.4  MCH 32.1  MCHC 33.7  RDW 12.9  PLT 185   Cardiac EnzymesNo results for input(s): TROPONINI in the last 168 hours. No results for input(s): TROPIPOC in the last 168 hours.  BNP Recent Labs  Lab 09/27/19 1708  BNP 1,032.7*    DDimer No results for input(s): DDIMER in the last 168 hours.  Radiology/Studies:  DG Chest Port 1 View  Result Date: 09/27/2019 CLINICAL DATA:  85 year old female with shortness of breath. EXAM: PORTABLE CHEST 1 VIEW COMPARISON:  Chest radiograph dated 05/05/2019 FINDINGS: Minimal left lung base atelectasis. No focal consolidation, pleural effusion or pneumothorax. Stable cardiomegaly. Atherosclerotic calcification of the aorta. Left pectoral AICD device. No acute osseous pathology. IMPRESSION: 1. Left lung base atelectasis/scarring.  No new consolidation. 2. Cardiomegaly. Electronically Signed   By: Anner Crete M.D.   On: 09/27/2019 18:29    Assessment and Plan:   1. Ventricular Tachycardia -- Presents with recurrent pre-syncopal episodes over the course of the last week, with the most recent episode terminated by ICD firing. Interrogation of device confirmed VT (per report). Continues to have frequent PVC and short runs of NSVT on telemetry. No electrolyte abnormalities to explain arrhythmias despite recent bouts of diarrhea. Recent device interrogation with some suggestion of fluid accumulation via OptiVol and patient  taking lasix PRN not daily, so could have more effect of fluid than is apparently though BNP not grossly different from admission in 04/2019. Had a long discussion with patient about prior antiarrhythmics, specifically amiodarone. She did not recall nausea/abdominal pain despite what was in the chart and was not opposed to another trial of this medication for VT suppression. She has had some degree of chronic abdominal pain and dyspepsia, so it is unclear whether prior  side effects were related to anti-arrhythmics or not.  -- Will give one dose 200mg  amiodarone tonight to assess for side effects and plan to start 400mg  BID tomorrow for oral load.  -- Would consider RHC this admission to understand if any component of VT is hemodynamically driven.  -- Repeat echocardiogram -- Keep Mg > 2, K > 4  2. Chronic Systolic Heart Failure, Non-Ischemic -- Continue losartan 12.5 mg daily -- Hold spiro for now until AKI resolves.  -- Continue lasix 20mg  daily -- Continue bisoprolol 2.5 mg BID  3. Chronic AF s/p AVN ablation and CRT-D -- Heparin gtt for anticoagulation in case of RHC   4. Chronic nausea, Abdominal Pain -- Query whether any of her pain is related to a low output syndrome? RHC as above.  -- Ativan 0.5mg  PRN  5. AKI -- Likely in the setting of frequent VT -- Hold spironolactone as above. -- Lasix 20mg  daily  6. Elevated troponin -- in s/o chronic HF and VT.   Severity of Illness: The appropriate patient status for this patient is INPATIENT. Inpatient status is judged to be reasonable and necessary in order to provide the required intensity of service to ensure the patient's safety. The patient's presenting symptoms, physical exam findings, and initial radiographic and laboratory data in the context of their chronic comorbidities is felt to place them at high risk for further clinical deterioration. Furthermore, it is not anticipated that the patient will be medically stable for discharge from the hospital within 2 midnights of admission. The following factors support the patient status of inpatient.   " The patient's presenting symptoms include VT " The worrisome physical exam findings include n/a " The initial radiographic and laboratory data are worrisome because of elevated pro-BNP.  " The chronic co-morbidities include HTN, HLD.    * I certify that at the point of admission it is my clinical judgment that the patient will require inpatient  hospital care spanning beyond 2 midnights from the point of admission due to high intensity of service, high risk for further deterioration and high frequency of surveillance required.*   For questions or updates, please contact [place] Please consult [email or web address] for contact info under   Signed, Milus Banister, MD  09/27/2019 10:38 PM

## 2019-09-28 ENCOUNTER — Inpatient Hospital Stay (HOSPITAL_COMMUNITY): Payer: PPO

## 2019-09-28 DIAGNOSIS — I361 Nonrheumatic tricuspid (valve) insufficiency: Secondary | ICD-10-CM

## 2019-09-28 DIAGNOSIS — I472 Ventricular tachycardia: Principal | ICD-10-CM

## 2019-09-28 DIAGNOSIS — I340 Nonrheumatic mitral (valve) insufficiency: Secondary | ICD-10-CM

## 2019-09-28 DIAGNOSIS — I5023 Acute on chronic systolic (congestive) heart failure: Secondary | ICD-10-CM

## 2019-09-28 LAB — TROPONIN I (HIGH SENSITIVITY): Troponin I (High Sensitivity): 36 ng/L — ABNORMAL HIGH (ref <18)

## 2019-09-28 LAB — APTT
aPTT: 34 seconds (ref 24–36)
aPTT: 82 seconds — ABNORMAL HIGH (ref 24–36)

## 2019-09-28 LAB — ECHOCARDIOGRAM COMPLETE
Height: 65 in
Weight: 2208 oz

## 2019-09-28 LAB — BASIC METABOLIC PANEL
Anion gap: 10 (ref 5–15)
BUN: 25 mg/dL — ABNORMAL HIGH (ref 8–23)
CO2: 24 mmol/L (ref 22–32)
Calcium: 8.3 mg/dL — ABNORMAL LOW (ref 8.9–10.3)
Chloride: 104 mmol/L (ref 98–111)
Creatinine, Ser: 1.09 mg/dL — ABNORMAL HIGH (ref 0.44–1.00)
GFR calc Af Amer: 55 mL/min — ABNORMAL LOW (ref >60)
GFR calc non Af Amer: 47 mL/min — ABNORMAL LOW (ref >60)
Glucose, Bld: 101 mg/dL — ABNORMAL HIGH (ref 70–99)
Potassium: 3.9 mmol/L (ref 3.5–5.1)
Sodium: 138 mmol/L (ref 135–145)

## 2019-09-28 LAB — MAGNESIUM: Magnesium: 2.1 mg/dL (ref 1.7–2.4)

## 2019-09-28 LAB — PROTIME-INR
INR: 1.5 — ABNORMAL HIGH (ref 0.8–1.2)
Prothrombin Time: 17.7 seconds — ABNORMAL HIGH (ref 11.4–15.2)

## 2019-09-28 LAB — T4, FREE: Free T4: 1.05 ng/dL (ref 0.61–1.12)

## 2019-09-28 LAB — SARS CORONAVIRUS 2 (TAT 6-24 HRS): SARS Coronavirus 2: NEGATIVE

## 2019-09-28 LAB — TSH: TSH: 2.672 u[IU]/mL (ref 0.350–4.500)

## 2019-09-28 LAB — HEPARIN LEVEL (UNFRACTIONATED): Heparin Unfractionated: 0.92 IU/mL — ABNORMAL HIGH (ref 0.30–0.70)

## 2019-09-28 MED ORDER — HEPARIN (PORCINE) 25000 UT/250ML-% IV SOLN
700.0000 [IU]/h | INTRAVENOUS | Status: DC
  Administered 2019-09-28: 06:00:00 850 [IU]/h via INTRAVENOUS
  Administered 2019-09-29: 07:00:00 700 [IU]/h via INTRAVENOUS
  Filled 2019-09-28 (×2): qty 250

## 2019-09-28 MED ORDER — FUROSEMIDE 10 MG/ML IJ SOLN
40.0000 mg | Freq: Once | INTRAMUSCULAR | Status: DC
  Filled 2019-09-28: qty 4

## 2019-09-28 MED ORDER — POTASSIUM CHLORIDE CRYS ER 20 MEQ PO TBCR
40.0000 meq | EXTENDED_RELEASE_TABLET | Freq: Once | ORAL | Status: AC
  Administered 2019-09-28: 10:00:00 40 meq via ORAL
  Filled 2019-09-28: qty 2

## 2019-09-28 MED ORDER — SPIRONOLACTONE 12.5 MG HALF TABLET
12.5000 mg | ORAL_TABLET | Freq: Every day | ORAL | Status: DC
  Administered 2019-09-28: 22:00:00 12.5 mg via ORAL
  Filled 2019-09-28 (×4): qty 1

## 2019-09-28 MED ORDER — FUROSEMIDE 10 MG/ML IJ SOLN
40.0000 mg | Freq: Two times a day (BID) | INTRAMUSCULAR | Status: DC
  Administered 2019-09-28 (×2): 40 mg via INTRAVENOUS
  Filled 2019-09-28: qty 4

## 2019-09-28 MED ORDER — SODIUM CHLORIDE 0.9 % IV SOLN: 250.0000 mL | INTRAVENOUS | Status: DC | PRN

## 2019-09-28 MED ORDER — AMIODARONE HCL 200 MG PO TABS
200.0000 mg | ORAL_TABLET | Freq: Two times a day (BID) | ORAL | Status: DC
  Administered 2019-09-28 – 2019-09-30 (×5): 200 mg via ORAL
  Filled 2019-09-28 (×5): qty 1

## 2019-09-28 MED ORDER — SODIUM CHLORIDE 0.9 % IV SOLN: INTRAVENOUS | Status: DC

## 2019-09-28 MED ORDER — SODIUM CHLORIDE 0.9% FLUSH: 3.0000 mL | INTRAVENOUS | Status: DC | PRN

## 2019-09-28 MED ORDER — SODIUM CHLORIDE 0.9% FLUSH
3.0000 mL | Freq: Two times a day (BID) | INTRAVENOUS | Status: DC
  Administered 2019-09-28: 22:00:00 3 mL via INTRAVENOUS

## 2019-09-28 NOTE — Progress Notes (Signed)
ANTICOAGULATION CONSULT NOTE - Initial Consult  Pharmacy Consult for Heparin Indication: atrial fibrillation (CHADS2VASc = 5)  Patient Measurements: Height: 5\' 5"  (165.1 cm) Weight: 138 lb (62.6 kg) IBW/kg (Calculated) : 57 Heparin weight: 64 kg  Vital Signs: BP: 116/86 (01/17 1000) Pulse Rate: 79 (01/17 1000)  Labs:   Recent Labs    09/27/19 1708 09/27/19 1938 09/28/19 0155 09/28/19 0642 09/28/19 0951 09/28/19 1218  HGB 16.0*  --   --   --   --   --   HCT 47.5*  --   --   --   --   --   PLT 185  --   --   --   --   --   APTT  --   --  34  --   --  82*  LABPROT  --   --  17.7*  --   --   --   INR  --   --  1.5*  --   --   --   HEPARINUNFRC  --   --  0.92*  --   --   --   CREATININE 1.46*  --   --  1.09*  --   --   TROPONINIHS 33* 51*  --   --  36*  --     Estimated Creatinine Clearance: 35.8 mL/min (A) (by C-G formula based on SCr of 1.09 mg/dL (H)).  Assessment: 85 y.o. female with h/o Afib (CHADS2VASc = 5), rivaroxaban on hold. Pharmacy consulted for heparin. Last dose of rivaroxaban 1/15 @1700 . Will monitor aPTT until concordant with HL.   APTT 82 in goal. H/H high, plt wnl.   Goal of Therapy:  APTT 66-102 sec Heparin level 0.3-0.7 units/ml Monitor platelets by anticoagulation protocol: Yes   Plan:  Continue heparin 850 units/hr  Monitor aPTT until concordant with HL Monitor daily aPTT, HL, CBC, plt Monitor for signs/symptoms of bleeding   Benetta Spar, PharmD, BCPS, BCCP Clinical Pharmacist  Please check AMION for all Industry phone numbers After 10:00 PM, call [place]

## 2019-09-28 NOTE — Consult Note (Signed)
Cardiology Consultation:   Patient ID: DACODA SPALLONE MRN: [id number]; DOB: 1935-09-10  Admit date: 09/27/2019 Date of Consult: 09/28/2019  Primary Care Provider: Lavone Orn, MD Primary Cardiologist: Aundra Dubin  Primary Electrophysiologist:  Allred   Patient Profile:   Cheryl Vargas is a 85 y.o. female with a hx of nonischemic cardiomyopathy who is being seen today for the evaluation of ventricular tachycardia at the request of Winfield Cunas.  History of Present Illness:   Ms. Voit is an 85 year old female with a history of chronic systolic heart failure due to nonischemic cardiomyopathy.  She also has permanent atrial fibrillation and is status post AV node ablation and Medtronic CRT D implant.  She presented to the hospital after an ICD shock yesterday.  She had multiple episodes yesterday of nonsustained ventricular tachycardia.  During these episodes, she felt weak and tired almost like she was going to pass out.  She tried to take Ativan with some mild relief.  Device interrogation shows ventricular tachycardia with multiple episodes of ATP which converted after a shocked by her ICD.  Heart Pathway Score:     Past Medical History:  Diagnosis Date  . Arthritis   . Chronic combined systolic (congestive) and diastolic (congestive) heart failure ([place])   . Mitral regurgitation   . Non-ischemic cardiomyopathy ([place])   . Persistent atrial fibrillation ([place])   . Ventricular tachycardia [facility])     Past Surgical History:  Procedure Laterality Date  . AV NODE ABLATION N/A 09/07/2017   Procedure: AV NODE ABLATION;  Surgeon: [name], MD;  Location: [facility];  Service: Cardiovascular;  Laterality: N/A;  . BIV ICD INSERTION CRT-D N/A 09/07/2017   Procedure: BIV ICD INSERTION CRT-D;  Surgeon: [name], MD;  Location: [facility];  Service: Cardiovascular;  Laterality: N/A;  . BUNIONECTOMY    . CARDIAC CATHETERIZATION     in 2004 at [facility]. "Insignificant  blockage" per patient  . CARDIAC CATHETERIZATION N/A 04/25/2016   Procedure: Left Heart Cath and Coronary Angiography;  Surgeon: Nelva Bush, MD;  Location: [place] [facility];  Service: Cardiovascular;  Laterality: N/A;  . CARDIOVERSION N/A 06/29/2017   Procedure: CARDIOVERSION;  Surgeon: Larey Dresser, MD;  Location: [facility] [place] ENDOSCOPY;  Service: Cardiovascular;  Laterality: N/A;  . CARDIOVERSION N/A 08/28/2017   Procedure: CARDIOVERSION;  Surgeon: Larey Dresser, MD;  Location: [facility] ENDOSCOPY;  Service: Cardiovascular;  Laterality: N/A;  . SHOULDER SURGERY     closed reduction  . TEE WITHOUT CARDIOVERSION N/A 06/29/2017   Procedure: TRANSESOPHAGEAL ECHOCARDIOGRAM (TEE);  Surgeon: Larey Dresser, MD;  Location: [facility] ENDOSCOPY;  Service: Cardiovascular;  Laterality: N/A;  . TONSILLECTOMY    . TOTAL HIP ARTHROPLASTY Left 03/18/2013   Procedure: TOTAL HIP ARTHROPLASTY ANTERIOR APPROACH;  Surgeon: Mauri Pole, MD;  Location: [facility] ORS;  Service: Orthopedics;  Laterality: Left;  . TUBAL LIGATION       Home Medications:  Prior to Admission medications   Medication Sig Start Date End Date Taking? Authorizing Provider  acetaminophen (TYLENOL) 500 MG tablet Take 500 mg by mouth every 6 (six) hours as needed for headache.   Yes [provider]  bisoprolol (ZEBETA) 5 MG tablet Take 0.5 tablets (2.5 mg total) by mouth 2 (two) times daily. 09/15/19  Yes Larey Dresser, MD  docusate sodium (COLACE) 100 MG capsule Take 100 mg by mouth daily as needed for mild constipation.   Yes [provider]  famotidine (PEPCID) 20 MG tablet Take 1  tablet (20 mg total) by mouth daily. 05/08/19 09/27/19 Yes Ghimire, Dante Gang, MD  fluticasone (FLONASE) 50 MCG/ACT nasal spray Place 1 spray into both nostrils daily as needed for allergies or rhinitis.    Yes [provider]  furosemide (LASIX) 20 MG tablet Take 1 tablet (20 mg total) by mouth daily. 02/11/19  Yes Larey Dresser, MD  LORazepam (ATIVAN)  0.5 MG tablet Take 1 tablet (0.5 mg total) by mouth daily as needed (nausea). 09/24/19  Yes Larey Dresser, MD  losartan (COZAAR) 25 MG tablet TAKE 1/2 TABLET BY MOUTH AT BEDTIME 06/17/19  Yes Larey Dresser, MD  Rivaroxaban (XARELTO) 15 MG TABS tablet Take 15 mg by mouth daily with supper.   Yes [provider]  spironolactone (ALDACTONE) 25 MG tablet TAKE 1 TABLET (25 MG TOTAL) BY MOUTH DAILY. Patient taking differently: Take 12.5 mg by mouth at bedtime.  06/16/19  Yes Bensimhon, Shaune Pascal, MD  zolpidem (AMBIEN) 5 MG tablet Take 1 tablet (5 mg total) by mouth at bedtime. 09/20/17  Yes Mariel Aloe, MD    Inpatient Medications: Scheduled Meds: . amiodarone  400 mg Oral BID  . bisoprolol  2.5 mg Oral BID  . famotidine  20 mg Oral Daily  . furosemide  20 mg Oral Daily  . losartan  12.5 mg Oral QHS  . sodium chloride flush  3 mL Intravenous Q12H   Continuous Infusions: . sodium chloride    . heparin 850 Units/hr (09/28/19 0543)   PRN Meds: sodium chloride, acetaminophen, docusate sodium, LORazepam, sodium chloride flush, zolpidem  Allergies:    Allergies  Allergen Reactions  . Sotalol Other (See Comments)    Prolonged QTc  . Alendronate Sodium Other (See Comments)    Arm pain  . Amiodarone Nausea Only  . Ciprofloxacin Other (See Comments)    Not effective  . Compazine [Prochlorperazine]   . Coreg [Carvedilol] Other (See Comments)    Fatigue   . Metoprolol Other (See Comments)    Profound fatigue    Social History:   Social History   Socioeconomic History  . Marital status: Widowed    Spouse name: Not on file  . Number of children: Not on file  . Years of education: Not on file  . Highest education level: Not on file  Occupational History  . Not on file  Tobacco Use  . Smoking status: Never Smoker  . Smokeless tobacco: Never Used  Substance and Sexual Activity  . Alcohol use: Yes    Alcohol/week: 3.0 standard drinks    Types: 3 Glasses of wine per  week    Comment: per week  . Drug use: No  . Sexual activity: Not on file  Other Topics Concern  . Not on file  Social History Narrative  . Not on file   Social Determinants of Health   Financial Resource Strain:   . Difficulty of Paying Living Expenses: Not on file  Food Insecurity:   . Worried About Charity fundraiser in the Last Year: Not on file  . Ran Out of Food in the Last Year: Not on file  Transportation Needs:   . Lack of Transportation (Medical): Not on file  . Lack of Transportation (Non-Medical): Not on file  Physical Activity:   . Days of Exercise per Week: Not on file  . Minutes of Exercise per Session: Not on file  Stress:   . Feeling of Stress : Not on file  Social Connections:   .  Frequency of Communication with Friends and Family: Not on file  . Frequency of Social Gatherings with Friends and Family: Not on file  . Attends Religious Services: Not on file  . Active Member of Clubs or Organizations: Not on file  . Attends Archivist Meetings: Not on file  . Marital Status: Not on file  Intimate Partner Violence:   . Fear of Current or Ex-Partner: Not on file  . Emotionally Abused: Not on file  . Physically Abused: Not on file  . Sexually Abused: Not on file    Family History:    Family History  Problem Relation Age of Onset  . Lung cancer Mother   . Heart attack Father      ROS:  Please see the history of present illness.   All other ROS reviewed and negative.     Physical Exam/Data:   Vitals:   09/28/19 0545 09/28/19 0600 09/28/19 0615 09/28/19 0714  BP: 119/73 117/72  107/66  Pulse: 80 80 80 80  Resp: 16 19 18 12   Temp:      TempSrc:      SpO2: 95%  96% 96%  Weight:      Height:       No intake or output data in the 24 hours ending 09/28/19 0830 Last 3 Weights 09/27/2019 05/28/2019 05/07/2019  Weight (lbs) 138 lb 138 lb 6.4 oz 138 lb 11.2 oz  Weight (kg) 62.596 kg 62.778 kg 62.914 kg     Body mass index is 22.96 kg/m.    General:  Well nourished, well developed, in no acute distress HEENT: normal Lymph: no adenopathy Neck: no JVD Endocrine:  No thryomegaly Vascular: No carotid bruits; FA pulses 2+ bilaterally without bruits  Cardiac:  normal S1, S2; RRR; no murmur  Lungs: Faint crackles at the bases Abd: soft, nontender, no hepatomegaly  Ext: no edema Musculoskeletal:  No deformities, BUE and BLE strength normal and equal Skin: warm and dry  Neuro:  CNs 2-12 intact, no focal abnormalities noted Psych:  Normal affect   EKG:  The EKG was personally reviewed and demonstrates: Sinus rhythm Telemetry:  Telemetry was personally reviewed and demonstrates: Sinus rhythm  Relevant CV Studies: TTE 05/06/19  1. The left ventricle has severely reduced systolic function, with an ejection fraction of 25-30%. The cavity size was normal. Left ventricular diastolic Doppler parameters are indeterminate. Diffuse hypokinesis with akinesis of the basal to mid  inferior and inferolateral walls.  2. The right ventricle has normal systolic function. The cavity was normal. There is no increase in right ventricular wall thickness.  3. Left atrial size was moderately dilated.  4. Right atrial size was mildly dilated.  5. Trivial pericardial effusion is present.  6. There is mild mitral annular calcification present. No evidence of mitral valve stenosis. Mild mitral regurgitation.  7. Tricuspid valve regurgitation is moderate-severe.  8. The aortic valve is tricuspid. Mild calcification of the aortic valve. Aortic valve regurgitation is mild by color flow Doppler. No stenosis of the aortic valve.  9. The aorta is normal unless otherwise noted. 10. The aortic root is normal in size and structure. 11. Normal IVC size. PA systolic pressure 34 mmHg. Nor=  Laboratory Data:  High Sensitivity Troponin:   Recent Labs  Lab 09/27/19 1708 09/27/19 1938  TROPONINIHS 33* 51*     Chemistry Recent Labs  Lab 09/27/19 1708  09/28/19 0642  NA 137 138  K 4.1 3.9  CL 102 104  CO2 23  24  GLUCOSE 148* 101*  BUN 33* 25*  CREATININE 1.46* 1.09*  CALCIUM 8.6* 8.3*  GFRNONAA 33* 47*  GFRAA 38* 55*  ANIONGAP 12 10    Recent Labs  Lab 09/27/19 1708  PROT 6.7  ALBUMIN 3.5  AST 62*  ALT 48*  ALKPHOS 64  BILITOT 1.0   Hematology Recent Labs  Lab 09/27/19 1708  WBC 7.7  RBC 4.98  HGB 16.0*  HCT 47.5*  MCV 95.4  MCH 32.1  MCHC 33.7  RDW 12.9  PLT 185   BNP Recent Labs  Lab 09/27/19 1708  BNP 1,032.7*    DDimer No results for input(s): DDIMER in the last 168 hours.   Radiology/Studies:  DG Chest Port 1 View  Result Date: 09/27/2019 CLINICAL DATA:  85 year old female with shortness of breath. EXAM: PORTABLE CHEST 1 VIEW COMPARISON:  Chest radiograph dated 05/05/2019 FINDINGS: Minimal left lung base atelectasis. No focal consolidation, pleural effusion or pneumothorax. Stable cardiomegaly. Atherosclerotic calcification of the aorta. Left pectoral AICD device. No acute osseous pathology. IMPRESSION: 1. Left lung base atelectasis/scarring.  No new consolidation. 2. Cardiomegaly. Electronically Signed   By: Anner Crete M.D.   On: 09/27/2019 18:29      Assessment and Plan:   1. Ventricular tachycardia: Had multiple episodes of VT yesterday with ICD firing on her last episode.  She did receive ATP.  At discharge, driving Telsa Dillavou need to be discussed with her per [facility] law no driving for 6 months.  She has a repeat echo planned.  Would potentially need right heart catheterization to see if this is hemodynamically driven as her BNP is thousand.  She has been put on amiodarone 200 mg.  We Jaileen Janelle continue amiodarone.  She has had some nausea in the past but she may tolerate it with Zofran. 2. Acute on chronic systolic heart failure due to nonischemic cardiomyopathy: BNP is 1000.  She could have a hemodynamic cause for her ventricular tachycardia.  I Charleen Madera give her 40 mg of Lasix today.  I have  discussed with the heart failure team who Deanie Jupiter see her later today for a determination of whether or not right heart catheterization is necessary.  She is currently on heparin. 3. Permanent atrial fibrillation: Currently on Xarelto at home but has been switched to heparin and prep for possible right heart catheterization.  Is status post Medtronic CRT-D. 4. Acute renal failure: Likely in the setting of ventricular tachycardia.  Holding Aldactone.  We Dyshawn Cangelosi give Lasix to see if this improves. 5. Elevated troponin: Likely due to heart failure and ventricular tachycardia.   For questions or updates, please contact [place] Please consult [email or web address] for contact info under     Signed, Rodrick Payson Meredith Leeds, MD  09/28/2019 8:30 AM

## 2019-09-28 NOTE — Progress Notes (Signed)
Echocardiogram 2D Echocardiogram has been performed.  Cheryl Vargas 09/28/2019, 9:30 AM

## 2019-09-28 NOTE — Progress Notes (Signed)
ANTICOAGULATION CONSULT NOTE - Initial Consult  Pharmacy Consult for Heparin Indication: atrial fibrillation  Allergies  Allergen Reactions  . Sotalol Other (See Comments)    Prolonged QTc  . Alendronate Sodium Other (See Comments)    Arm pain  . Amiodarone Nausea Only  . Ciprofloxacin Other (See Comments)    Not effective  . Compazine [Prochlorperazine]   . Coreg [Carvedilol] Other (See Comments)    Fatigue   . Metoprolol Other (See Comments)    Profound fatigue    Patient Measurements: Height: 5\' 5"  (165.1 cm) Weight: 138 lb (62.6 kg) IBW/kg (Calculated) : 57  Vital Signs: Temp: 97.7 F (36.5 C) (01/16 1700) Temp Source: Oral (01/16 1700) BP: 100/60 (01/17 0115) Pulse Rate: 80 (01/17 0115)  Labs: Recent Labs    09/27/19 1708 09/27/19 1938 09/28/19 0155  HGB 16.0*  --   --   HCT 47.5*  --   --   PLT 185  --   --   APTT  --   --  34  LABPROT  --   --  17.7*  INR  --   --  1.5*  HEPARINUNFRC  --   --  0.92*  CREATININE 1.46*  --   --   TROPONINIHS 33* 51*  --     Estimated Creatinine Clearance: 26.7 mL/min (A) (by C-G formula based on SCr of 1.46 mg/dL (H)).   Medical History: Past Medical History:  Diagnosis Date  . Arthritis   . Chronic combined systolic (congestive) and diastolic (congestive) heart failure ([place])   . Mitral regurgitation   . Non-ischemic cardiomyopathy ([place])   . Persistent atrial fibrillation ([place])   . Ventricular tachycardia ([facility])     Medications:  No current facility-administered medications on file prior to encounter.   Current Outpatient Medications on File Prior to Encounter  Medication Sig Dispense Refill  . acetaminophen (TYLENOL) 500 MG tablet Take 500 mg by mouth every 6 (six) hours as needed for headache.    . bisoprolol (ZEBETA) 5 MG tablet Take 0.5 tablets (2.5 mg total) by mouth 2 (two) times daily. 90 tablet 3  . docusate sodium (COLACE) 100 MG capsule Take 100 mg by mouth daily as needed for mild constipation.     . famotidine (PEPCID) 20 MG tablet Take 1 tablet (20 mg total) by mouth daily. 30 tablet 2  . fluticasone (FLONASE) 50 MCG/ACT nasal spray Place 1 spray into both nostrils daily as needed for allergies or rhinitis.     . furosemide (LASIX) 20 MG tablet Take 1 tablet (20 mg total) by mouth daily. 30 tablet 11  . LORazepam (ATIVAN) 0.5 MG tablet Take 1 tablet (0.5 mg total) by mouth daily as needed (nausea). 10 tablet 0  . losartan (COZAAR) 25 MG tablet TAKE 1/2 TABLET BY MOUTH AT BEDTIME 15 tablet 6  . Rivaroxaban (XARELTO) 15 MG TABS tablet Take 15 mg by mouth daily with supper.    Marland Kitchen spironolactone (ALDACTONE) 25 MG tablet TAKE 1 TABLET (25 MG TOTAL) BY MOUTH DAILY. (Patient taking differently: Take 12.5 mg by mouth at bedtime. ) 30 tablet 3  . zolpidem (AMBIEN) 5 MG tablet Take 1 tablet (5 mg total) by mouth at bedtime. 5 tablet 0     Assessment: 85 y.o. female with h/o Afib, Xarelto on hold, for heparin  Goal of Therapy:  APTT 66-102 sec Heparin level 0.3-0.7 units/ml Monitor platelets by anticoagulation protocol: Yes   Plan:  Start heparin 850 units/hr  APTT in  8 hours  Caryl Pina 09/28/2019,3:20 AM

## 2019-09-28 NOTE — ED Notes (Signed)
Pt placed on hospital bed

## 2019-09-28 NOTE — Progress Notes (Signed)
Patient ID: Cheryl Vargas, female   DOB: [birth year], 85 y.o.   MRN: [id number]     Advanced Heart Failure Rounding Note  PCP-Cardiologist: [name], MD   Subjective:    Patient admitted with presyncope and ICD shock, found to have had VT.  Recently, has had multiple episodes of VT, ATP.    No chest pain.  She has been more short of breath recently.   HS-TnI 33 => 53.  BNP 1032.  Creatinine 1.46 => 1.09.   Objective:   Weight Range: 62.6 kg Body mass index is 22.96 kg/m.   Vital Signs:   Temp:  [97.7 F (36.5 C)] 97.7 F (36.5 C) (01/16 1700) Pulse Rate:  [79-88] 79 (01/17 0900) Resp:  [12-29] 18 (01/17 0900) BP: (93-142)/(56-91) 100/62 (01/17 0900) SpO2:  [91 %-100 %] 97 % (01/17 0900) Weight:  [62.6 kg] 62.6 kg (01/16 1702)    Weight change: Filed Weights   09/27/19 1702  Weight: 62.6 kg    Intake/Output:  No intake or output data in the 24 hours ending 09/28/19 0934    Physical Exam    General:  Well appearing. No resp difficulty HEENT: Normal Neck: Supple. JVP 14+ cm. Carotids 2+ bilat; no bruits. No lymphadenopathy or thyromegaly appreciated. Cor: PMI lateral. Regular rate & rhythm. No rubs, gallops. 1/6 HSM LLSB.  Lungs: Clear Abdomen: Soft, nontender, nondistended. No hepatosplenomegaly. No bruits or masses. Good bowel sounds. Extremities: No cyanosis, clubbing, rash, edema Neuro: Alert & orientedx3, cranial nerves grossly intact. moves all 4 extremities w/o difficulty. Affect pleasant   Telemetry   Atrial fibrillation with BiV pacing (personally reviewed)   Labs    CBC Recent Labs    09/27/19 1708  WBC 7.7  NEUTROABS 5.9  HGB 16.0*  HCT 47.5*  MCV 95.4  PLT 353   Basic Metabolic Panel Recent Labs    09/27/19 1708 09/28/19 0642  NA 137 138  K 4.1 3.9  CL 102 104  CO2 23 24  GLUCOSE 148* 101*  BUN 33* 25*  CREATININE 1.46* 1.09*  CALCIUM 8.6* 8.3*  MG 2.1 2.1   Liver Function Tests Recent Labs    09/27/19 1708  AST  62*  ALT 48*  ALKPHOS 64  BILITOT 1.0  PROT 6.7  ALBUMIN 3.5   No results for input(s): LIPASE, AMYLASE in the last 72 hours. Cardiac Enzymes No results for input(s): CKTOTAL, CKMB, CKMBINDEX, TROPONINI in the last 72 hours.  BNP: BNP (last 3 results) Recent Labs    05/05/19 2051 09/27/19 1708  BNP 1,069.2* 1,032.7*    ProBNP (last 3 results) No results for input(s): PROBNP in the last 8760 hours.   D-Dimer No results for input(s): DDIMER in the last 72 hours. Hemoglobin A1C No results for input(s): HGBA1C in the last 72 hours. Fasting Lipid Panel No results for input(s): CHOL, HDL, LDLCALC, TRIG, CHOLHDL, LDLDIRECT in the last 72 hours. Thyroid Function Tests Recent Labs    09/28/19 0155  TSH 2.672    Other results:   Imaging    DG Chest Port 1 View  Result Date: 09/27/2019 CLINICAL DATA:  85 year old female with shortness of breath. EXAM: PORTABLE CHEST 1 VIEW COMPARISON:  Chest radiograph dated 05/05/2019 FINDINGS: Minimal left lung base atelectasis. No focal consolidation, pleural effusion or pneumothorax. Stable cardiomegaly. Atherosclerotic calcification of the aorta. Left pectoral AICD device. No acute osseous pathology. IMPRESSION: 1. Left lung base atelectasis/scarring.  No new consolidation. 2. Cardiomegaly. Electronically Signed   By: Milas Hock  Radparvar M.D.   On: 09/27/2019 18:29      Medications:     Scheduled Medications: . amiodarone  200 mg Oral BID  . bisoprolol  2.5 mg Oral BID  . famotidine  20 mg Oral Daily  . furosemide  40 mg Intravenous Once  . losartan  12.5 mg Oral QHS  . potassium chloride  40 mEq Oral Once  . sodium chloride flush  3 mL Intravenous Q12H     Infusions: . sodium chloride    . heparin 850 Units/hr (09/28/19 0543)     PRN Medications:  sodium chloride, acetaminophen, docusate sodium, LORazepam, sodium chloride flush, zolpidem   Assessment/Plan   1. VT: Episodes of VT/NSVT recently with ATP and  ultimately ICD discharge.  She has had pulseless VT in the past.  She had presyncope with current event.  In the past, has not tolerated amiodarone, sotalol, Tikosyn, mexiletine, or ranolazine.  However, she is willing to retry amiodarone. Troponin minimally elevated, suspect most likely demand ischemia with CHF exacerbation. Doubt ACS, no chest pain.  I think that her VT may be related to worsening CHF/volume overload.   - Will load amiodarone gently, 200 mg bid.  No nausea so far.  - Will get 1 more troponin to make sure no upward trend.  - Continue bisoprolol.  - RHC to assess for low output HF and follow volume status. We discussed risks/benefits and she agrees to procedure.  - No driving x 6 months.  2. Chronic systolic HF: NICM, LHC 10/353 showed no significant coronary disease. Cardiac MRI 10/17 EF 36%, diffuse HK, normal RV, biatrial enlargement, moderate MR, non-coronary LGE pattern involving the mid-wall of the basal to mid septum and inferior wall. Possibly prior myocarditis versus a form of infiltrative disease. Echo in 8/20 showed EF 25-30%, severe diffuse HK, normal RV, moderate-severe TR.  Medtronic CRT-D s/p AV nodal ablation.  NYHA class III symptoms at home, worse recently.  Optivol has been suggestive of increasing volume recently and she is volume overloaded with JVD on exam.  - Lasix 40 mg IV bid for now and replace K.  -Continue bisoprolol 2.5 mg bid.  Unable to tolerate increase.   -Continuelosartan 12.5 mg qHS. Unable to tolerate increase.  - Continue spironolactone 12.5 daily.  Unable to tolerate increase.  - RHC tomorrow to assess filling pressures and cardiac output, as above.  - Repeat echo.  3. Atrial fibrillation: Chronic, s/p AV nodal ablation and BiV pacing.  - Hold Xarelto for cath, cover with heparin gtt.   4. PVCs: Frequent PVCs have limited BiV pacing.  Not a good PVC ablation candidate per EP. Cannot tolerate amiodarone, sotolol, or Tikosyn. Did not tolerate  mexilitine with nausea. Most recently, stopped ranolazine due to nausea. Additionally, did not tolerate further uptitration of bisoprolol.  - As above, we are going to try her again on amiodarone.  5. Mitral regurgitation: Mild on 8/20 echo.  Length of Stay: 1  [name], MD  09/28/2019, 9:34 AM  Advanced Heart Failure Team Pager [phone] (M-F; 7a - 4p)  Please contact [facility] for night-coverage after hours (4p -7a ) and weekends on [email or web address]

## 2019-09-28 NOTE — Plan of Care (Signed)
  Problem: Education: Goal: Knowledge of General Education information will improve Description: Including pain rating scale, medication(s)/side effects and non-pharmacologic comfort measures Outcome: Progressing   Problem: Clinical Measurements: Goal: Respiratory complications will improve Outcome: Progressing Goal: Cardiovascular complication will be avoided Outcome: Progressing   

## 2019-09-28 NOTE — H&P (View-Only) (Signed)
Patient ID: Cheryl Vargas, female   DOB: 1935-05-24, 86 y.o.   MRN: [id number]     Advanced Heart Failure Rounding Note  PCP-Cardiologist: [name], MD   Subjective:    Patient admitted with presyncope and ICD shock, found to have had VT.  Recently, has had multiple episodes of VT, ATP.    No chest pain.  She has been more short of breath recently.   HS-TnI 33 => 53.  BNP 1032.  Creatinine 1.46 => 1.09.   Objective:   Weight Range: 62.6 kg Body mass index is 22.96 kg/m.   Vital Signs:   Temp:  [97.7 F (36.5 C)] 97.7 F (36.5 C) (01/16 1700) Pulse Rate:  [79-88] 79 (01/17 0900) Resp:  [12-29] 18 (01/17 0900) BP: (93-142)/(56-91) 100/62 (01/17 0900) SpO2:  [91 %-100 %] 97 % (01/17 0900) Weight:  [62.6 kg] 62.6 kg (01/16 1702)    Weight change: Filed Weights   09/27/19 1702  Weight: 62.6 kg    Intake/Output:  No intake or output data in the 24 hours ending 09/28/19 0934    Physical Exam    General:  Well appearing. No resp difficulty HEENT: Normal Neck: Supple. JVP 14+ cm. Carotids 2+ bilat; no bruits. No lymphadenopathy or thyromegaly appreciated. Cor: PMI lateral. Regular rate & rhythm. No rubs, gallops. 1/6 HSM LLSB.  Lungs: Clear Abdomen: Soft, nontender, nondistended. No hepatosplenomegaly. No bruits or masses. Good bowel sounds. Extremities: No cyanosis, clubbing, rash, edema Neuro: Alert & orientedx3, cranial nerves grossly intact. moves all 4 extremities w/o difficulty. Affect pleasant   Telemetry   Atrial fibrillation with BiV pacing (personally reviewed)   Labs    CBC Recent Labs    09/27/19 1708  WBC 7.7  NEUTROABS 5.9  HGB 16.0*  HCT 47.5*  MCV 95.4  PLT 998   Basic Metabolic Panel Recent Labs    09/27/19 1708 09/28/19 0642  NA 137 138  K 4.1 3.9  CL 102 104  CO2 23 24  GLUCOSE 148* 101*  BUN 33* 25*  CREATININE 1.46* 1.09*  CALCIUM 8.6* 8.3*  MG 2.1 2.1   Liver Function Tests Recent Labs    09/27/19 1708  AST  62*  ALT 48*  ALKPHOS 64  BILITOT 1.0  PROT 6.7  ALBUMIN 3.5   No results for input(s): LIPASE, AMYLASE in the last 72 hours. Cardiac Enzymes No results for input(s): CKTOTAL, CKMB, CKMBINDEX, TROPONINI in the last 72 hours.  BNP: BNP (last 3 results) Recent Labs    05/05/19 2051 09/27/19 1708  BNP 1,069.2* 1,032.7*    ProBNP (last 3 results) No results for input(s): PROBNP in the last 8760 hours.   D-Dimer No results for input(s): DDIMER in the last 72 hours. Hemoglobin A1C No results for input(s): HGBA1C in the last 72 hours. Fasting Lipid Panel No results for input(s): CHOL, HDL, LDLCALC, TRIG, CHOLHDL, LDLDIRECT in the last 72 hours. Thyroid Function Tests Recent Labs    09/28/19 0155  TSH 2.672    Other results:   Imaging    DG Chest Port 1 View  Result Date: 09/27/2019 CLINICAL DATA:  85 year old female with shortness of breath. EXAM: PORTABLE CHEST 1 VIEW COMPARISON:  Chest radiograph dated 05/05/2019 FINDINGS: Minimal left lung base atelectasis. No focal consolidation, pleural effusion or pneumothorax. Stable cardiomegaly. Atherosclerotic calcification of the aorta. Left pectoral AICD device. No acute osseous pathology. IMPRESSION: 1. Left lung base atelectasis/scarring.  No new consolidation. 2. Cardiomegaly. Electronically Signed   By: Milas Hock  Radparvar M.D.   On: 09/27/2019 18:29      Medications:     Scheduled Medications: . amiodarone  200 mg Oral BID  . bisoprolol  2.5 mg Oral BID  . famotidine  20 mg Oral Daily  . furosemide  40 mg Intravenous Once  . losartan  12.5 mg Oral QHS  . potassium chloride  40 mEq Oral Once  . sodium chloride flush  3 mL Intravenous Q12H     Infusions: . sodium chloride    . heparin 850 Units/hr (09/28/19 0543)     PRN Medications:  sodium chloride, acetaminophen, docusate sodium, LORazepam, sodium chloride flush, zolpidem   Assessment/Plan   1. VT: Episodes of VT/NSVT recently with ATP and  ultimately ICD discharge.  She has had pulseless VT in the past.  She had presyncope with current event.  In the past, has not tolerated amiodarone, sotalol, Tikosyn, mexiletine, or ranolazine.  However, she is willing to retry amiodarone. Troponin minimally elevated, suspect most likely demand ischemia with CHF exacerbation. Doubt ACS, no chest pain.  I think that her VT may be related to worsening CHF/volume overload.   - Will load amiodarone gently, 200 mg bid.  No nausea so far.  - Will get 1 more troponin to make sure no upward trend.  - Continue bisoprolol.  - RHC to assess for low output HF and follow volume status. We discussed risks/benefits and she agrees to procedure.  - No driving x 6 months.  2. Chronic systolic HF: NICM, LHC 12/4032 showed no significant coronary disease. Cardiac MRI 10/17 EF 36%, diffuse HK, normal RV, biatrial enlargement, moderate MR, non-coronary LGE pattern involving the mid-wall of the basal to mid septum and inferior wall. Possibly prior myocarditis versus a form of infiltrative disease. Echo in 8/20 showed EF 25-30%, severe diffuse HK, normal RV, moderate-severe TR.  Medtronic CRT-D s/p AV nodal ablation.  NYHA class III symptoms at home, worse recently.  Optivol has been suggestive of increasing volume recently and she is volume overloaded with JVD on exam.  - Lasix 40 mg IV bid for now and replace K.  -Continue bisoprolol 2.5 mg bid.  Unable to tolerate increase.   -Continuelosartan 12.5 mg qHS. Unable to tolerate increase.  - Continue spironolactone 12.5 daily.  Unable to tolerate increase.  - RHC tomorrow to assess filling pressures and cardiac output, as above.  - Repeat echo.  3. Atrial fibrillation: Chronic, s/p AV nodal ablation and BiV pacing.  - Hold Xarelto for cath, cover with heparin gtt.   4. PVCs: Frequent PVCs have limited BiV pacing.  Not a good PVC ablation candidate per EP. Cannot tolerate amiodarone, sotolol, or Tikosyn. Did not tolerate  mexilitine with nausea. Most recently, stopped ranolazine due to nausea. Additionally, did not tolerate further uptitration of bisoprolol.  - As above, we are going to try her again on amiodarone.  5. Mitral regurgitation: Mild on 8/20 echo.  Length of Stay: 1  [name], MD  09/28/2019, 9:34 AM  Advanced Heart Failure Team Pager [phone] (M-F; 7a - 4p)  Please contact [facility] for night-coverage after hours (4p -7a ) and weekends on [email or web address]

## 2019-09-29 ENCOUNTER — Encounter (HOSPITAL_COMMUNITY)
Admission: EM | Disposition: A | Discharge status: Home / Self Care | Payer: Self-pay | Source: Home / Self Care | Attending: Cardiology

## 2019-09-29 DIAGNOSIS — I509 Heart failure, unspecified: Secondary | ICD-10-CM

## 2019-09-29 HISTORY — PX: RIGHT HEART CATH: CATH118263

## 2019-09-29 LAB — POCT I-STAT EG7
Acid-Base Excess: 2 mmol/L (ref 0.0–2.0)
Bicarbonate: 26.8 mmol/L (ref 20.0–28.0)
Calcium, Ion: 1.13 mmol/L — ABNORMAL LOW (ref 1.15–1.40)
HCT: 44 % (ref 36.0–46.0)
Hemoglobin: 15 g/dL (ref 12.0–15.0)
O2 Saturation: 60 %
Potassium: 3.9 mmol/L (ref 3.5–5.1)
Sodium: 141 mmol/L (ref 135–145)
TCO2: 28 mmol/L (ref 22–32)
pCO2, Ven: 43.2 mmHg — ABNORMAL LOW (ref 44.0–60.0)
pH, Ven: 7.4 (ref 7.250–7.430)
pO2, Ven: 31 mmHg — CL (ref 32.0–45.0)

## 2019-09-29 LAB — BASIC METABOLIC PANEL
Anion gap: 10 (ref 5–15)
BUN: 30 mg/dL — ABNORMAL HIGH (ref 8–23)
CO2: 22 mmol/L (ref 22–32)
Calcium: 8.9 mg/dL (ref 8.9–10.3)
Chloride: 107 mmol/L (ref 98–111)
Creatinine, Ser: 1.44 mg/dL — ABNORMAL HIGH (ref 0.44–1.00)
GFR calc Af Amer: 39 mL/min — ABNORMAL LOW (ref >60)
GFR calc non Af Amer: 34 mL/min — ABNORMAL LOW (ref >60)
Glucose, Bld: 111 mg/dL — ABNORMAL HIGH (ref 70–99)
Potassium: 4.2 mmol/L (ref 3.5–5.1)
Sodium: 139 mmol/L (ref 135–145)

## 2019-09-29 LAB — CBC
HCT: 43.3 % (ref 36.0–46.0)
Hemoglobin: 15 g/dL (ref 12.0–15.0)
MCH: 32.1 pg (ref 26.0–34.0)
MCHC: 34.6 g/dL (ref 30.0–36.0)
MCV: 92.7 fL (ref 80.0–100.0)
Platelets: 153 10*3/uL (ref 150–400)
RBC: 4.67 MIL/uL (ref 3.87–5.11)
RDW: 12.7 % (ref 11.5–15.5)
WBC: 6.4 10*3/uL (ref 4.0–10.5)
nRBC: 0 % (ref 0.0–0.2)

## 2019-09-29 LAB — HEPARIN LEVEL (UNFRACTIONATED): Heparin Unfractionated: 0.92 IU/mL — ABNORMAL HIGH (ref 0.30–0.70)

## 2019-09-29 LAB — APTT: aPTT: 165 seconds (ref 24–36)

## 2019-09-29 LAB — MAGNESIUM: Magnesium: 2 mg/dL (ref 1.7–2.4)

## 2019-09-29 SURGERY — RIGHT HEART CATH
Anesthesia: LOCAL

## 2019-09-29 MED ORDER — RIVAROXABAN 15 MG PO TABS
15.0000 mg | ORAL_TABLET | Freq: Once | ORAL | Status: AC
  Administered 2019-09-29: 10:00:00 15 mg via ORAL
  Filled 2019-09-29: qty 1

## 2019-09-29 MED ORDER — RIVAROXABAN 15 MG PO TABS
15.0000 mg | ORAL_TABLET | Freq: Every day | ORAL | Status: DC
  Administered 2019-09-30: 13:00:00 15 mg via ORAL
  Filled 2019-09-29: qty 1

## 2019-09-29 MED ORDER — HEPARIN (PORCINE) IN NACL 1000-0.9 UT/500ML-% IV SOLN
INTRAVENOUS | Status: DC | PRN
  Administered 2019-09-29: 500 mL

## 2019-09-29 MED ORDER — FUROSEMIDE 10 MG/ML IJ SOLN
40.0000 mg | Freq: Two times a day (BID) | INTRAMUSCULAR | Status: DC
  Administered 2019-09-29 (×2): 40 mg via INTRAVENOUS
  Filled 2019-09-29 (×2): qty 4

## 2019-09-29 MED ORDER — LIDOCAINE HCL (PF) 1 % IJ SOLN
INTRAMUSCULAR | Status: DC | PRN
  Administered 2019-09-29: 2 mL

## 2019-09-29 MED ORDER — FENTANYL CITRATE (PF) 100 MCG/2ML IJ SOLN
INTRAMUSCULAR | Status: DC | PRN
  Administered 2019-09-29: 12.5 ug via INTRAVENOUS

## 2019-09-29 MED ORDER — LIDOCAINE HCL (PF) 1 % IJ SOLN
INTRAMUSCULAR | Status: AC
  Filled 2019-09-29: qty 30

## 2019-09-29 MED ORDER — HEPARIN (PORCINE) IN NACL 1000-0.9 UT/500ML-% IV SOLN
INTRAVENOUS | Status: AC
  Filled 2019-09-29: qty 500

## 2019-09-29 MED ORDER — DIGOXIN 125 MCG PO TABS
0.0625 mg | ORAL_TABLET | Freq: Every day | ORAL | Status: DC
  Administered 2019-09-29 – 2019-09-30 (×2): 0.0625 mg via ORAL
  Filled 2019-09-29 (×2): qty 1

## 2019-09-29 MED ORDER — FENTANYL CITRATE (PF) 100 MCG/2ML IJ SOLN
INTRAMUSCULAR | Status: AC
  Filled 2019-09-29: qty 2

## 2019-09-29 MED ORDER — MIDAZOLAM HCL 2 MG/2ML IJ SOLN
INTRAMUSCULAR | Status: DC | PRN
  Administered 2019-09-29: 0.5 mg via INTRAVENOUS

## 2019-09-29 MED ORDER — MIDAZOLAM HCL 2 MG/2ML IJ SOLN
INTRAMUSCULAR | Status: AC
  Filled 2019-09-29: qty 2

## 2019-09-29 SURGICAL SUPPLY — 8 items
CATH BALLN WEDGE 5F 110CM (CATHETERS) ×1 IMPLANT
GUIDEWIRE .025 260CM (WIRE) ×1 IMPLANT
KIT HEART LEFT (KITS) ×1 IMPLANT
PACK CARDIAC CATHETERIZATION (CUSTOM PROCEDURE TRAY) ×2 IMPLANT
PROTECTION STATION PRESSURIZED (MISCELLANEOUS) ×2
SHEATH GLIDE SLENDER 4/5FR (SHEATH) ×1 IMPLANT
STATION PROTECTION PRESSURIZED (MISCELLANEOUS) IMPLANT
TRANSDUCER W/STOPCOCK (MISCELLANEOUS) ×2 IMPLANT

## 2019-09-29 NOTE — Progress Notes (Signed)
PT Cancellation Note  Patient Details Name: Cheryl Vargas MRN: [id number] DOB: 1935/07/12   Cancelled Treatment:    Reason Eval/Treat Not Completed: Patient declined, no reason specified patient adamantly refuses PT, educated MDs want her walking in hall regularly but she continues to decline, stating "well...[name]". Unable to convince her to participate today. Will continue to follow acutely.    [name], DPT, PN1   Supplemental Physical Therapist [facility]    Pager [phone] Acute Rehab Office [phone]

## 2019-09-29 NOTE — Progress Notes (Addendum)
Patient ID: Cheryl Vargas, female   DOB: [birth year], 85 y.o.   MRN: [id number]     Advanced Heart Failure Rounding Note  PCP-Cardiologist: [name], MD   Subjective:    Patient admitted with presyncope and ICD shock, found to have had VT.  Recently, has had multiple episodes of VT, ATP.    No recent CP.   HS-TnI 33 => 51=>36.  BNP 1032.  Creatinine 1.46 => 1.09=>1.44.   SCr 4.2. Mg 2.0   -1.8L in UOP yesterday after IV Lasix.   Echo shows worsening LVEF at 20% (previously 25-30%), diffuse hypokinesis. No RWMAs. No LVH. RV systolic function mildly reduced.   _______________________  2D Echo 09/28/19 LVEF by visual estimation, is <20%. The left ventricle has severely decreased function. There is no left ventricular hypertrophy. 2. Moderately dilated left ventricular internal cavity size. 3. The left ventricle demonstrates global hypokinesis. 4. Global right ventricle has mildly reduced systolic function.The right ventricular size is mildly enlarged. No increase in right ventricular wall thickness. 5. Left atrial size was severely dilated. 6. Right atrial size was severely dilated. 7. Trivial pericardial effusion is present. 8. The mitral valve is normal in structure. Mild to moderate mitral valve regurgitation. 9. The tricuspid valve is normal in structure. 10. The aortic valve is tricuspid. Aortic valve regurgitation is mild. Mild aortic valve sclerosis without stenosis. 11. Pulmonic regurgitation is mild. 12. The pulmonic valve was not well visualized. Pulmonic valve regurgitation is mild. 13. Moderately elevated pulmonary artery systolic pressure. 14. A pacer wire is visualized. 15. The inferior vena cava is dilated in size with <50% respiratory variability, suggesting right atrial pressure of 15 mmHg. 16. The left ventricular function has worsened. 17. No intracardiac thrombi or masses were visualized. 18. Prior images reviewed side by side. In comparison to the  previous echocardiogram(s): The left ventricular function is worsened.  Objective:   Weight Range: 61.8 kg Body mass index is 22.68 kg/m.   Vital Signs:   Temp:  [97.4 F (36.3 C)-97.7 F (36.5 C)] 97.7 F (36.5 C) (01/18 0405) Pulse Rate:  [78-82] 82 (01/18 0405) Resp:  [12-22] 22 (01/18 0405) BP: (100-116)/(62-99) 102/86 (01/18 0405) SpO2:  [92 %-98 %] 92 % (01/18 0405) Weight:  [61.8 kg-63.9 kg] 61.8 kg (01/18 0405)    Weight change: Filed Weights   09/27/19 1702 09/28/19 1117 09/29/19 0405  Weight: 62.6 kg 63.9 kg 61.8 kg    Intake/Output:   Intake/Output Summary (Last 24 hours) at 09/29/2019 0712 Last data filed at 09/29/2019 0655 Gross per 24 hour  Intake 458.25 ml  Output 1850 ml  Net -1391.75 ml      Physical Exam    PHYSICAL EXAM: General:  Well appearing, thin elderly WF. No respiratory difficulty HEENT: normal Neck: supple. Elevated JVD. Carotids 2+ bilat; no bruits. No lymphadenopathy or thyromegaly appreciated. Cor: PMI nondisplaced. Regular rate & rhythm. No rubs, gallops or murmurs. Lungs: clear Abdomen: soft, nontender, nondistended. No hepatosplenomegaly. No bruits or masses. Good bowel sounds. Extremities: no cyanosis, clubbing, rash, edema Neuro: alert & oriented x 3, cranial nerves grossly intact. moves all 4 extremities w/o difficulty. Affect pleasant.   Telemetry   Atrial fibrillation with BiV pacing 81 bpm, no VT (personally reviewed)   Labs    CBC Recent Labs    09/27/19 1708 09/29/19 0318  WBC 7.7 6.4  NEUTROABS 5.9  --   HGB 16.0* 15.0  HCT 47.5* 43.3  MCV 95.4 92.7  PLT 185 153  Basic Metabolic Panel Recent Labs    09/28/19 0642 09/29/19 0318  NA 138 139  K 3.9 4.2  CL 104 107  CO2 24 22  GLUCOSE 101* 111*  BUN 25* 30*  CREATININE 1.09* 1.44*  CALCIUM 8.3* 8.9  MG 2.1 2.0   Liver Function Tests Recent Labs    09/27/19 1708  AST 62*  ALT 48*  ALKPHOS 64  BILITOT 1.0  PROT 6.7  ALBUMIN 3.5   No  results for input(s): LIPASE, AMYLASE in the last 72 hours. Cardiac Enzymes No results for input(s): CKTOTAL, CKMB, CKMBINDEX, TROPONINI in the last 72 hours.  BNP: BNP (last 3 results) Recent Labs    05/05/19 2051 09/27/19 1708  BNP 1,069.2* 1,032.7*    ProBNP (last 3 results) No results for input(s): PROBNP in the last 8760 hours.   D-Dimer No results for input(s): DDIMER in the last 72 hours. Hemoglobin A1C No results for input(s): HGBA1C in the last 72 hours. Fasting Lipid Panel No results for input(s): CHOL, HDL, LDLCALC, TRIG, CHOLHDL, LDLDIRECT in the last 72 hours. Thyroid Function Tests Recent Labs    09/28/19 0155  TSH 2.672    Other results:   Imaging    ECHOCARDIOGRAM COMPLETE  Result Date: 09/28/2019   ECHOCARDIOGRAM REPORT   Patient Name:   Cheryl Vargas Date of Exam: 09/28/2019 Medical Rec #:  [id number]       Height:       65.0 in Accession #:    [id number]      Weight:       138.0 lb Date of Birth:  Jan 14, 1935      BSA:          1.69 m Patient Age:    31 years        BP:           106/64 mmHg Patient Gender: F               HR:           81 bpm. Exam Location:  Inpatient Procedure: 2D Echo, 3D Echo, Color Doppler, Cardiac Doppler and Strain Analysis Indications:    I47.2 Ventricular tachycardia  History:        Patient has prior history of Echocardiogram examinations, most                 recent 05/06/2019. CHF, Defibrillator, Arrythmias:Atrial                 Fibrillation; Risk Factors:Hypertension and Dyslipidemia.  Sonographer:    Raquel Sarna Senior RDCS Referring Phys: [id number] [place]  1. Left ventricular ejection fraction, by visual estimation, is <20%. The left ventricle has severely decreased function. There is no left ventricular hypertrophy.  2. Moderately dilated left ventricular internal cavity size.  3. The left ventricle demonstrates global hypokinesis.  4. Global right ventricle has mildly reduced systolic function.The right  ventricular size is mildly enlarged. No increase in right ventricular wall thickness.  5. Left atrial size was severely dilated.  6. Right atrial size was severely dilated.  7. Trivial pericardial effusion is present.  8. The mitral valve is normal in structure. Mild to moderate mitral valve regurgitation.  9. The tricuspid valve is normal in structure. 10. The aortic valve is tricuspid. Aortic valve regurgitation is mild. Mild aortic valve sclerosis without stenosis. 11. Pulmonic regurgitation is mild. 12. The pulmonic valve was not well visualized. Pulmonic valve regurgitation is mild. 13. Moderately elevated pulmonary  artery systolic pressure. 14. A pacer wire is visualized. 15. The inferior vena cava is dilated in size with <50% respiratory variability, suggesting right atrial pressure of 15 mmHg. 16. The left ventricular function has worsened. 17. No intracardiac thrombi or masses were visualized. 18. Prior images reviewed side by side. In comparison to the previous echocardiogram(s): The left ventricular function is worsened. FINDINGS  Left Ventricle: Left ventricular ejection fraction, by visual estimation, is <20%. The left ventricle has severely decreased function. The left ventricle demonstrates global hypokinesis. The left ventricular internal cavity size was moderately dilated left ventricle. There is no left ventricular hypertrophy. The left ventricular diastology could not be evaluated due to atrial fibrillation. Indeterminate filling pressures. Right Ventricle: The right ventricular size is mildly enlarged. No increase in right ventricular wall thickness. Global RV systolic function is has mildly reduced systolic function. The tricuspid regurgitant velocity is 2.58 m/s, and with an assumed right atrial pressure of 15 mmHg, the estimated right ventricular systolic pressure is moderately elevated at 41.6 mmHg. Left Atrium: Left atrial size was severely dilated. Right Atrium: Right atrial size was  severely dilated Pericardium: Trivial pericardial effusion is present. Mitral Valve: The mitral valve is normal in structure. Mild to moderate mitral valve regurgitation, with centrally-directed jet. Tricuspid Valve: The tricuspid valve is normal in structure. Tricuspid valve regurgitation mild-moderate. Aortic Valve: The aortic valve is tricuspid. Aortic valve regurgitation is mild. Aortic regurgitation PHT measures 411 msec. Mild aortic valve sclerosis is present, with no evidence of aortic valve stenosis. Pulmonic Valve: The pulmonic valve was not well visualized. Pulmonic valve regurgitation is mild. Pulmonic regurgitation is mild. Aorta: The aortic root and ascending aorta are structurally normal, with no evidence of dilitation. Venous: The inferior vena cava is dilated in size with less than 50% respiratory variability, suggesting right atrial pressure of 15 mmHg. IAS/Shunts: No atrial level shunt detected by color flow Doppler. Additional Comments: No intracardiac thrombi or masses were visualized. A pacer wire is visualized.  LEFT VENTRICLE PLAX 2D LVIDd:         6.05 cm       Diastology LVIDs:         5.55 cm       LV e' lateral:   7.62 cm/s LV PW:         0.80 cm       LV E/e' lateral: 11.8 LV IVS:        0.70 cm       LV e' medial:    6.09 cm/s LVOT diam:     2.00 cm       LV E/e' medial:  14.7 LV SV:         33 ml LV SV Index:   19.37 LVOT Area:     3.14 cm  LV Volumes (MOD) LV area d, A2C:    38.40 cm LV area d, A4C:    36.30 cm LV area s, A2C:    31.00 cm LV area s, A4C:    27.60 cm LV major d, A2C:   8.57 cm LV major d, A4C:   8.53 cm LV major s, A2C:   7.58 cm LV major s, A4C:   7.51 cm LV vol d, MOD A2C: 145.0 ml LV vol d, MOD A4C: 127.0 ml LV vol s, MOD A2C: 109.0 ml LV vol s, MOD A4C: 85.4 ml LV SV MOD A2C:     36.0 ml LV SV MOD A4C:     127.0 ml LV  SV MOD BP:      39.0 ml RIGHT VENTRICLE RV S prime:     8.92 cm/s TAPSE (M-mode): 2.2 cm LEFT ATRIUM              Index       RIGHT ATRIUM            Index LA diam:        5.10 cm  3.02 cm/m  RA Area:     30.20 cm LA Vol (A2C):   109.0 ml 64.52 ml/m RA Volume:   118.00 ml 69.84 ml/m LA Vol (A4C):   79.5 ml  47.05 ml/m LA Biplane Vol: 96.8 ml  57.29 ml/m  AORTIC VALVE LVOT Vmax:   65.00 cm/s LVOT Vmean:  43.700 cm/s LVOT VTI:    0.123 m AI PHT:      411 msec  AORTA Ao Root diam: 2.60 cm MITRAL VALVE                        TRICUSPID VALVE MV Area (PHT): 4.89 cm             TR Peak grad:   26.6 mmHg MV PHT:        44.95 msec           TR Vmax:        258.00 cm/s MV Decel Time: 155 msec MR Peak grad:    73.6 mmHg          SHUNTS MR Mean grad:    49.0 mmHg          Systemic VTI:  0.12 m MR Vmax:         429.00 cm/s        Systemic Diam: 2.00 cm MR Vmean:        336.0 cm/s MR PISA:         1.01 cm MR PISA Eff ROA: 9 mm MR PISA Radius:  0.40 cm MV E velocity: 89.70 cm/s 103 cm/s MV A velocity: 29.60 cm/s 70.3 cm/s MV E/A ratio:  3.03       1.5  Mihai Croitoru MD Electronically signed by Sanda Klein MD Signature Date/Time: 09/28/2019/11:26:31 AM    Final      Medications:     Scheduled Medications: . amiodarone  200 mg Oral BID  . bisoprolol  2.5 mg Oral BID  . famotidine  20 mg Oral Daily  . furosemide  40 mg Intravenous BID  . losartan  12.5 mg Oral QHS  . sodium chloride flush  3 mL Intravenous Q12H  . sodium chloride flush  3 mL Intravenous Q12H  . spironolactone  12.5 mg Oral Daily    Infusions: . sodium chloride    . sodium chloride    . sodium chloride 10 mL/hr at 09/29/19 0637  . heparin 700 Units/hr (09/29/19 0639)    PRN Medications: sodium chloride, sodium chloride, acetaminophen, docusate sodium, LORazepam, sodium chloride flush, sodium chloride flush, zolpidem   Assessment/Plan   1. VT: Episodes of VT/NSVT recently with ATP and ultimately ICD discharge.  She has had pulseless VT in the past.  She had presyncope with current event.  In the past, has not tolerated amiodarone, sotalol, Tikosyn, mexiletine, or  ranolazine.  However, she is willing to retry amiodarone. Troponin minimally elevated  33 => 51=>36, suspect most likely demand ischemia with CHF exacerbation. Doubt ACS, no chest pain.  I think that her VT may be related  to worsening CHF/volume overload.   - Load amiodarone gently, 200 mg bid.  No nausea so far.  Will send home on low dose 200 mg daily given difficulty tolerating amiodarone in the past.  - Continue bisoprolol.  - RHC to assess for low output HF and follow volume status. We discussed risks/benefits and she agrees to procedure.  - No driving x 6 months.  2. Chronic systolic HF: NICM, LHC 02/4679 showed no significant coronary disease. Cardiac MRI 10/17 EF 36%, diffuse HK, normal RV, biatrial enlargement, moderate MR, non-coronary LGE pattern involving the mid-wall of the basal to mid septum and inferior wall. Possibly prior myocarditis versus a form of infiltrative disease. Echo in 8/20 showed EF 25-30%, severe diffuse HK, normal RV, moderate-severe TR.  Medtronic CRT-D s/p AV nodal ablation.  NYHA class III symptoms at home, worse recently.  Optivol has been suggestive of increasing volume recently and she remains volume overloaded with JVD on exam. -1.8L in UOP yesterday w/ IV Lasix.  - RHC today to further guide diuresis.  -Continue bisoprolol 2.5 mg bid.  Unable to tolerate increase.   -Continuelosartan 12.5 mg qHS. Unable to tolerate increase.  - Continue spironolactone 12.5 daily.  Unable to tolerate increase.  - RHC today to assess filling pressures and cardiac output, as above.  - Repeat this admit w/ worsening LV systolic function, EF now <20% (previously 25-30%). No RWMAs. No LVH. RV systolic function mildly reduced.  3. Atrial fibrillation: Chronic, s/p AV nodal ablation and BiV pacing.  - Restart home Xarelto after cath, stop heparin.    4. PVCs: Frequent PVCs have limited BiV pacing.  Not a good PVC ablation candidate per EP. Cannot tolerate amiodarone, sotolol, or  Tikosyn. Did not tolerate mexilitine with nausea. Most recently, stopped ranolazine due to nausea. Additionally, did not tolerate further uptitration of bisoprolol.  - As above, we are going to try her again on amiodarone.  5. Mitral regurgitation: Mild on 8/20 echo. - mild to moderate on echo 1/21  Length of Stay: 2  Lyda Jester, PA-C  09/29/2019, 7:12 AM  Advanced Heart Failure Team Pager [phone] (M-F; 7a - 4p)  Please contact [facility] for night-coverage after hours (4p -7a ) and weekends on [email or web address]  Patient seen with PA, agree with the above note.   RHC done today: Procedural Findings: Hemodynamics (mmHg) RA mean 9 RV 40/8 PA 39/19, mean 29 PCWP mean 22 Oxygen saturations: PA 60% AO 98% Cardiac Output (Fick) 2.9  Cardiac Index (Fick) 1.72 PVR 2.4 WU  Discussed with Dr. Rayann Heman as well.  She has had no VT overnight.  Suspect VT may have been triggered by volume overload/CHF. - Continue amiodarone 200 mg bid, home on amiodarone 200 mg daily.  - Dr Rayann Heman to adjust ATP - If she continues to have VT episodes, VT ablation would be a consideration => close followup with EP.   On RHC, she has mild volume overload still with low cardiac index 1.7.  Given her age and a degree of frailty, I think that LVAD would be difficult for her.  - Lasix 40 mg IV bid today, then likely back to po.  Will have to watch renal function carefully.  - start low dose digoxin 0.0625 mg daily.   Restart home Xarelto.   Mobilize, possibly home tomorrow.   [name] 09/29/2019 8:44 AM

## 2019-09-29 NOTE — Progress Notes (Signed)
[facility] for Heparin Indication: atrial fibrillation  Allergies  Allergen Reactions  . Sotalol Other (See Comments)    Prolonged QTc  . Alendronate Sodium Other (See Comments)    Arm pain  . Amiodarone Nausea Only  . Ciprofloxacin Other (See Comments)    Not effective  . Compazine [Prochlorperazine]   . Coreg [Carvedilol] Other (See Comments)    Fatigue   . Metoprolol Other (See Comments)    Profound fatigue    Patient Measurements: Height: 5\' 5"  (165.1 cm) Weight: 136 lb 4.8 oz (61.8 kg) IBW/kg (Calculated) : 57  Vital Signs: Temp: 97.7 F (36.5 C) (01/18 0405) Temp Source: Oral (01/18 0405) BP: 102/86 (01/18 0405) Pulse Rate: 82 (01/18 0405)  Labs: Recent Labs    09/27/19 1708 09/27/19 1938 09/28/19 0155 09/28/19 2993 09/28/19 0951 09/28/19 1218 09/29/19 0318  HGB 16.0*  --   --   --   --   --  15.0  HCT 47.5*  --   --   --   --   --  43.3  PLT 185  --   --   --   --   --  153  APTT  --   --  34  --   --  82* 165*  LABPROT  --   --  17.7*  --   --   --   --   INR  --   --  1.5*  --   --   --   --   HEPARINUNFRC  --   --  0.92*  --   --   --  0.92*  CREATININE 1.46*  --   --  1.09*  --   --  1.44*  TROPONINIHS 33* 51*  --   --  36*  --   --     Estimated Creatinine Clearance: 27.1 mL/min (A) (by C-G formula based on SCr of 1.44 mg/dL (H)).  Assessment: 85 y.o. female with h/o Afib, Xarelto on hold, for heparin  Goal of Therapy:  APTT 66-102 sec Heparin level 0.3-0.7 units/ml Monitor platelets by anticoagulation protocol: Yes   Plan:  Decrease Heparin 700 units/hr  Follow up after cath today   Caryl Pina 09/29/2019,6:21 AM

## 2019-09-29 NOTE — Progress Notes (Signed)
Pt's BP 89/51. Scheduled PM meds could possibly lower pt's BP more. Paged on call HF. Akhter advised to only give pm amiodarone and to hold other meds tonight.

## 2019-09-29 NOTE — Interval H&P Note (Signed)
History and Physical Interval Note:  09/29/2019 8:05 AM  Cheryl Vargas  has presented today for surgery, with the diagnosis of HF.  The various methods of treatment have been discussed with the patient and family. After consideration of risks, benefits and other options for treatment, the patient has consented to  Procedure(s): RIGHT HEART CATH (N/A) as a surgical intervention.  The patient's history has been reviewed, patient examined, no change in status, stable for surgery.  I have reviewed the patient's chart and labs.  Questions were answered to the patient's satisfaction.     Cheryl Vargas [employer]

## 2019-09-29 NOTE — Progress Notes (Addendum)
Progress Note  Patient Name: Cheryl Vargas Date of Encounter: 09/29/2019  Primary Cardiologist: [name], MD   Subjective   No CP, no rest SOB.    Inpatient Medications    Scheduled Meds: . amiodarone  200 mg Oral BID  . bisoprolol  2.5 mg Oral BID  . digoxin  0.0625 mg Oral Daily  . famotidine  20 mg Oral Daily  . furosemide  40 mg Intravenous BID  . losartan  12.5 mg Oral QHS  . [START ON 09/30/2019] rivaroxaban  15 mg Oral Q supper  . sodium chloride flush  3 mL Intravenous Q12H  . sodium chloride flush  3 mL Intravenous Q12H  . spironolactone  12.5 mg Oral Daily   Continuous Infusions: . sodium chloride     PRN Meds: sodium chloride, acetaminophen, docusate sodium, LORazepam, sodium chloride flush, zolpidem   Vital Signs    Vitals:   09/29/19 0808 09/29/19 0813 09/29/19 0818 09/29/19 0945  BP: 129/71 121/81 122/78   Pulse: 80 77 80 79  Resp: 18 16 18    Temp:      TempSrc:      SpO2: 98% 98% 98%   Weight:      Height:        Intake/Output Summary (Last 24 hours) at 09/29/2019 1119 Last data filed at 09/29/2019 0655 Gross per 24 hour  Intake 458.25 ml  Output 1850 ml  Net -1391.75 ml   Last 3 Weights 09/29/2019 09/28/2019 09/27/2019  Weight (lbs) 136 lb 4.8 oz 140 lb 12.8 oz 138 lb  Weight (kg) 61.825 kg 63.866 kg 62.596 kg      Telemetry    SR, infrequent PVCs, one 4 beat NSVT - Personally Reviewed  ECG    No new EKGs - Personally Reviewed  Physical Exam   GEN: No acute distress.   Neck: No JVD Cardiac: RRR, no murmurs, rubs, or gallops.  Respiratory: slightly diminished at the bases, no rales, rhonchi, wheezes, no crackles appreciated GI: Soft, nontender, non-distended  MS: No edema; age appropriate atrophy Neuro:  Nonfocal  Psych: Normal affect   Labs    High Sensitivity Troponin:   Recent Labs  Lab 09/27/19 1708 09/27/19 1938 09/28/19 0951  TROPONINIHS 33* 51* 36*      Chemistry Recent Labs  Lab 09/27/19 1708 09/28/19  0642 09/29/19 0318  NA 137 138 139  K 4.1 3.9 4.2  CL 102 104 107  CO2 23 24 22   GLUCOSE 148* 101* 111*  BUN 33* 25* 30*  CREATININE 1.46* 1.09* 1.44*  CALCIUM 8.6* 8.3* 8.9  PROT 6.7  --   --   ALBUMIN 3.5  --   --   AST 62*  --   --   ALT 48*  --   --   ALKPHOS 64  --   --   BILITOT 1.0  --   --   GFRNONAA 33* 47* 34*  GFRAA 38* 55* 39*  ANIONGAP 12 10 10      Hematology Recent Labs  Lab 09/27/19 1708 09/29/19 0318  WBC 7.7 6.4  RBC 4.98 4.67  HGB 16.0* 15.0  HCT 47.5* 43.3  MCV 95.4 92.7  MCH 32.1 32.1  MCHC 33.7 34.6  RDW 12.9 12.7  PLT 185 153    BNP Recent Labs  Lab 09/27/19 1708  BNP 1,032.7*     DDimer No results for input(s): DDIMER in the last 168 hours.   Radiology      DG Chest  Port 1 View Result Date: 09/27/2019 CLINICAL DATA:  85 year old female with shortness of breath. EXAM: PORTABLE CHEST 1 VIEW COMPARISON:  Chest radiograph dated 05/05/2019 FINDINGS: Minimal left lung base atelectasis. No focal consolidation, pleural effusion or pneumothorax. Stable cardiomegaly. Atherosclerotic calcification of the aorta. Left pectoral AICD device. No acute osseous pathology. IMPRESSION: 1. Left lung base atelectasis/scarring.  No new consolidation. 2. Cardiomegaly. Electronically Signed   By: Anner Crete M.D.   On: 09/27/2019 18:29     Cardiac Studies   09/29/19: RHC Result Date: 09/29/2019 1. Mildly elevated right and left heart filling pressures. 2. Mild pulmonary venous hypertension. 3. Low cardiac output.    09/28/19: TTE IMPRESSIONS 1. Left ventricular ejection fraction, by visual estimation, is <20%. The left ventricle has severely decreased function. There is no left ventricular hypertrophy.  2. Moderately dilated left ventricular internal cavity size.  3. The left ventricle demonstrates global hypokinesis.  4. Global right ventricle has mildly reduced systolic function.The right ventricular size is mildly enlarged. No increase in right  ventricular wall thickness.  5. Left atrial size was severely dilated.  6. Right atrial size was severely dilated.  7. Trivial pericardial effusion is present.  8. The mitral valve is normal in structure. Mild to moderate mitral valve regurgitation.  9. The tricuspid valve is normal in structure. 10. The aortic valve is tricuspid. Aortic valve regurgitation is mild. Mild aortic valve sclerosis without stenosis. 11. Pulmonic regurgitation is mild. 12. The pulmonic valve was not well visualized. Pulmonic valve regurgitation is mild. 13. Moderately elevated pulmonary artery systolic pressure. 14. A pacer wire is visualized. 15. The inferior vena cava is dilated in size with <50% respiratory variability, suggesting right atrial pressure of 15 mmHg. 16. The left ventricular function has worsened. 17. No intracardiac thrombi or masses were visualized. 18. Prior images reviewed side by side.  In comparison to the previous echocardiogram(s): The left ventricular function is worsened  Patient Profile     85 y.o. female NICM, permanent AFib, chronic CHF (systolic) admitted with ICD shocks and VT  Device information MDT CRT-D (RV/LV) implanted 09/08/2017  Interrogation (carelink) 09/27/2019 Battery and lead measurements are good Had NSVT episodes noted same day One was in VF rate (though self terminated) CL 200-263ms  one VT that had 2 ATP that failed and a successful shock x1 (CL 210ms)  Assessment & Plan    1. VT     Dr. Rayann Heman has seen and examined the patient this morning Recommends discharge on amiodarone 200mg  daily He has discussed with device rep, programming changes in effort to make ATP more successful (completed) No plans for ablation at this time/this admission Follow up with Dr. Rayann Heman is in place   I have discussed with the patient [place] law, no driving 6 months, she states understanding (mentions she had to do the same back in 2018 with her initial event)   2.  Acute on chronic CHF     AHF team on board     Suspect VT may be 2/2 HF exacerbation     S/p RHC this AM   3. Permanent AFib     CHA2DS2Vasc is 3, on Xarelto outpt > heparin here     xarelto resumed    For questions or updates, please contact [place] Please consult [email or web address] for contact info under        Signed, Baldwin [place], PA-C  09/29/2019, 11:19 AM     I have seen, examined the patient, and  reviewed the above assessment and plan.  Changes to above are made where necessary.  On exam, RRR.  She is s/p RHC with plans for continued diuresis.  VT is now quiescent with amiodarone. Given advanced age, we will try medical therapy rather than ablation at this time.  I have reviewed device interrogation and have reprogrammed her ATP therapy to be more aggressive in treated VT in order to hopefully minimize ICD shocks.  Reduce amiodarone to 200mg  daily at discharge No driving x 6 months I will follow-up with her virtually in 2 weeks  Co Sign: [name], MD

## 2019-09-29 NOTE — Progress Notes (Signed)
[place] for Heparin > resume Xarelto Indication: atrial fibrillation  Allergies  Allergen Reactions  . Sotalol Other (See Comments)    Prolonged QTc  . Alendronate Sodium Other (See Comments)    Arm pain  . Amiodarone Nausea Only  . Ciprofloxacin Other (See Comments)    Not effective  . Compazine [Prochlorperazine]   . Coreg [Carvedilol] Other (See Comments)    Fatigue   . Metoprolol Other (See Comments)    Profound fatigue    Patient Measurements: Height: 5\' 5"  (165.1 cm) Weight: 136 lb 4.8 oz (61.8 kg) IBW/kg (Calculated) : 57  Vital Signs: Temp: 97.7 F (36.5 C) (01/18 0405) Temp Source: Oral (01/18 0405) BP: 122/78 (01/18 0818) Pulse Rate: 80 (01/18 0818)  Labs: Recent Labs    09/27/19 1708 09/27/19 1938 09/28/19 0155 09/28/19 9678 09/28/19 0951 09/28/19 1218 09/29/19 0318  HGB 16.0*  --   --   --   --   --  15.0  HCT 47.5*  --   --   --   --   --  43.3  PLT 185  --   --   --   --   --  153  APTT  --   --  34  --   --  82* 165*  LABPROT  --   --  17.7*  --   --   --   --   INR  --   --  1.5*  --   --   --   --   HEPARINUNFRC  --   --  0.92*  --   --   --  0.92*  CREATININE 1.46*  --   --  1.09*  --   --  1.44*  TROPONINIHS 33* 51*  --   --  36*  --   --     Estimated Creatinine Clearance: 27.1 mL/min (A) (by C-G formula based on SCr of 1.44 mg/dL (H)).  Assessment: 85 y.o. female with h/o Afib, Xarelto on hold, for heparin.  S/p RHC - discussed with Dr. Aundra Dubin, okay to resume Xarelto.  Goal of Therapy:  APTT 66-102 sec Heparin level 0.3-0.7 units/ml Monitor platelets by anticoagulation protocol: Yes   Plan:  Stop IV heparin. Resume Xarelto 15 mg daily.  Will give dose now, then around 2 pm tomorrow, and then back to 5 pm on Wednesday.  Marguerite Olea, [facility] Clinical Pharmacist Phone [phone]  09/29/2019 9:44 AM

## 2019-09-29 NOTE — Discharge Summary (Addendum)
Advanced Heart Failure Team  Discharge Summary   Patient ID: Cheryl Vargas MRN: [id number], DOB/AGE: [birth year] 85 y.o. Admit date: 09/27/2019 D/C date:     09/30/2019   Primary Discharge Diagnoses:  1. VT 2. Chronic Systolic HF 3. A fib, chronic  4. PVCs 5. 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  Admitted with presyncope and ICD shock, found to have had VT.  Recently, has had multiple episodes of VT, ATP. EP/HF team consulted. She has had multiple issues with antiarrhythmics. She was gently loaded on amiodarone and able to tolerate the lower dose. Plan to continue amio 200 mg daily.   Diuresed with IV lasix and transitioned back to lasix 20 mg daily. Had [place]  that showed low output cardiac output and mildly elevated filling pressures. Started on digoxin 0.0625 mg daily.     See below for detailed problems list. She will contine to be followed closely in the HF clinic. Plan to check dig level and bmet next week at her follow up appointment.     1. VT: Episodes of VT/NSVT recently with ATP and ultimately ICD discharge.  She has had pulseless VT in the past.  She had presyncope with current event.  In the past, has not tolerated amiodarone, sotalol, Tikosyn, mexiletine, or ranolazine.  However, she is willing to retry amiodarone. Troponin minimally elevated  33 => 51=>36, suspect most likely demand ischemia with CHF exacerbation. Doubt ACS, no chest pain.  VT may be related to worsening CHF/volume overload.   - Load amiodarone gently, 200 mg bid.  No nausea so far.  Will send home on low dose 200 mg daily given difficulty tolerating amiodarone in the past.  - Continue bisoprolol.  - RHC completed and showed low output HF.  - No driving x 6 months.  2. Chronic systolic HF: [place] 01/2777 showed no significant coronary disease. Cardiac MRI 10/17 EF  36%, diffuse HK, normal RV, biatrial enlargement, moderate MR, non-coronary LGE pattern involving the mid-wall of the basal to mid septum and inferior wall. Possibly prior myocarditis versus a form of infiltrative disease. Echo in 8/20 showedEF 25-30%, severe diffuse HK, normal RV, moderate-severeTR. [employer] CRT-D s/p AV nodal ablation. NYHA class III symptoms at home, worse recently.   -RHC completed and showed low output HF.Digoxin was added.  -Continue bisoprolol 2.5 mg bid. Unable to tolerate increase.  -Continuelosartan 12.5 mg qHS. Unable to tolerate increase.  - Continue spironolactone 12.5 daily. Unable to tolerate increase.  - ECHO this admit w/ worsening LV systolic function, EF now <20% (previously 25-30%). No RWMAs. No LVH. RV systolic function mildly reduced.  3. Atrial fibrillation: Chronic, s/p AV nodal ablation and BiV pacing.  - Continue xarelto 15 mg daily.  4. PVCs: Frequent PVCs have limited BiV pacing. Not a good PVC ablation candidate per EP. Cannot tolerate amiodarone, sotolol, or Tikosyn. Did not tolerate mexilitine with nausea. Most recently, stopped ranolazine due to nausea. Additionally, did not tolerate further uptitration of bisoprolol.  - As above, we are going to try her again on amiodarone.  5. Mitral regurgitation: Mild on8/20echo. Mild to moderate on echo 09/28/19   Discharge Vitals: Blood pressure (!) 105/56, pulse 80, temperature 98.2 F (36.8 C), resp. rate 12, height 5\' 5"  (1.651 m), weight 61.3 kg, SpO2 97 %.  Labs: Lab Results  Component Value Date  WBC 6.4 09/30/2019   HGB 15.5 (H) 09/30/2019   HCT 43.9 09/30/2019   MCV 92.4 09/30/2019   PLT 176 09/30/2019    Recent Labs  Lab 09/27/19 1708 09/28/19 0642 09/30/19 0349  NA 137   < > 140  K 4.1   < > 4.1  CL 102   < > 100  CO2 23   < > 27  BUN 33*   < > 34*  CREATININE 1.46*   < > 1.37*  CALCIUM 8.6*   < > 8.8*  PROT 6.7  --   --   BILITOT 1.0  --   --   ALKPHOS 64  --    --   ALT 48*  --   --   AST 62*  --   --   GLUCOSE 148*   < > 92   < > = values in this interval not displayed.   Lab Results  Component Value Date   CHOL 176 12/17/2017   HDL 46 12/17/2017   LDLCALC 120 (H) 12/17/2017   TRIG 52 12/17/2017   BNP (last 3 results) Recent Labs    05/05/19 2051 09/27/19 1708  BNP 1,069.2* 1,032.7*    ProBNP (last 3 results) No results for input(s): PROBNP in the last 8760 hours.   Diagnostic Studies/Procedures   CARDIAC CATHETERIZATION  Result Date: 09/29/2019 1. Mildly elevated right and left heart filling pressures. 2. Mild pulmonary venous hypertension. 3. Low cardiac output.    Discharge Medications   Allergies as of 09/30/2019      Reactions   Sotalol Other (See Comments)   Prolonged QTc   Alendronate Sodium Other (See Comments)   Arm pain   Amiodarone Nausea Only   Ciprofloxacin Other (See Comments)   Not effective   Compazine [prochlorperazine]    Coreg [carvedilol] Other (See Comments)   Fatigue   Metoprolol Other (See Comments)   Profound fatigue      Medication List    TAKE these medications   acetaminophen 500 MG tablet Commonly known as: TYLENOL Take 500 mg by mouth every 6 (six) hours as needed for headache.   amiodarone 200 MG tablet Commonly known as: PACERONE Take 1 tablet (200 mg total) by mouth daily.   bisoprolol 5 MG tablet Commonly known as: ZEBETA Take 0.5 tablets (2.5 mg total) by mouth 2 (two) times daily.   digoxin 0.125 MG tablet Commonly known as: Lanoxin Take 0.5 tablets (0.0625 mg total) by mouth daily.   docusate sodium 100 MG capsule Commonly known as: COLACE Take 100 mg by mouth daily as needed for mild constipation.   famotidine 20 MG tablet Commonly known as: PEPCID Take 1 tablet (20 mg total) by mouth daily.   fluticasone 50 MCG/ACT nasal spray Commonly known as: FLONASE Place 1 spray into both nostrils daily as needed for allergies or rhinitis.   furosemide 20 MG  tablet Commonly known as: LASIX Take 1 tablet (20 mg total) by mouth daily.   LORazepam 0.5 MG tablet Commonly known as: ATIVAN Take 1 tablet (0.5 mg total) by mouth daily as needed (nausea).   losartan 25 MG tablet Commonly known as: COZAAR TAKE 1/2 TABLET BY MOUTH AT BEDTIME   Rivaroxaban 15 MG Tabs tablet Commonly known as: XARELTO Take 15 mg by mouth daily with supper. Notes to patient: TAKE XARELTO AT 2:00 PM TODAY AND TOMORROW AT BEDTIME.   spironolactone 25 MG tablet Commonly known as: ALDACTONE Take 0.5 tablets (12.5 mg total) by  mouth at bedtime. What changed: See the new instructions.   zolpidem 5 MG tablet Commonly known as: AMBIEN Take 1 tablet (5 mg total) by mouth at bedtime.       Disposition   The patient will be discharged in stable condition to home. Discharge Instructions    (HEART FAILURE PATIENTS) Call MD:  Anytime you have any of the following symptoms: 1) 3 pound weight gain in 24 hours or 5 pounds in 1 week 2) shortness of breath, with or without a dry hacking cough 3) swelling in the hands, feet or stomach 4) if you have to sleep on extra pillows at night in order to breathe.   Complete by: As directed    Diet - low sodium heart healthy   Complete by: As directed    Heart Failure patients record your daily weight using the same scale at the same time of day   Complete by: As directed    Increase activity slowly   Complete by: As directed      Follow-up Information    [name], MD Follow up.   Specialty: Cardiology Why: 10/13/2019 @ 11:00AM, this is scheduled as a virtual visit, Dr. Jackalyn Lombard nurse will call you ahead of time to discuss details  Contact information: [address] [place] [address] [phone]        [place] Follow up on 10/09/2019.   Specialty: Cardiology Why: 1000. Garage Code [id number]  Contact information: [address] [id number] [place] [phone]            Duration of Discharge Encounter: Greater than 35 minutes   Signed, Darrick Grinder NP-C  09/30/2019, 1:07 PM

## 2019-09-30 ENCOUNTER — Encounter (HOSPITAL_COMMUNITY): Payer: PPO | Admitting: Cardiology

## 2019-09-30 ENCOUNTER — Other Ambulatory Visit (HOSPITAL_COMMUNITY): Payer: Self-pay

## 2019-09-30 DIAGNOSIS — I5043 Acute on chronic combined systolic (congestive) and diastolic (congestive) heart failure: Secondary | ICD-10-CM

## 2019-09-30 LAB — POCT I-STAT EG7
Acid-Base Excess: 2 mmol/L (ref 0.0–2.0)
Bicarbonate: 26.7 mmol/L (ref 20.0–28.0)
Calcium, Ion: 1.19 mmol/L (ref 1.15–1.40)
HCT: 44 % (ref 36.0–46.0)
Hemoglobin: 15 g/dL (ref 12.0–15.0)
O2 Saturation: 59 %
Potassium: 4 mmol/L (ref 3.5–5.1)
Sodium: 141 mmol/L (ref 135–145)
TCO2: 28 mmol/L (ref 22–32)
pCO2, Ven: 42.7 mmHg — ABNORMAL LOW (ref 44.0–60.0)
pH, Ven: 7.405 (ref 7.250–7.430)
pO2, Ven: 31 mmHg — CL (ref 32.0–45.0)

## 2019-09-30 LAB — BASIC METABOLIC PANEL
Anion gap: 13 (ref 5–15)
BUN: 34 mg/dL — ABNORMAL HIGH (ref 8–23)
CO2: 27 mmol/L (ref 22–32)
Calcium: 8.8 mg/dL — ABNORMAL LOW (ref 8.9–10.3)
Chloride: 100 mmol/L (ref 98–111)
Creatinine, Ser: 1.37 mg/dL — ABNORMAL HIGH (ref 0.44–1.00)
GFR calc Af Amer: 42 mL/min — ABNORMAL LOW (ref >60)
GFR calc non Af Amer: 36 mL/min — ABNORMAL LOW (ref >60)
Glucose, Bld: 92 mg/dL (ref 70–99)
Potassium: 4.1 mmol/L (ref 3.5–5.1)
Sodium: 140 mmol/L (ref 135–145)

## 2019-09-30 LAB — CBC
HCT: 43.9 % (ref 36.0–46.0)
Hemoglobin: 15.5 g/dL — ABNORMAL HIGH (ref 12.0–15.0)
MCH: 32.6 pg (ref 26.0–34.0)
MCHC: 35.3 g/dL (ref 30.0–36.0)
MCV: 92.4 fL (ref 80.0–100.0)
Platelets: 176 10*3/uL (ref 150–400)
RBC: 4.75 MIL/uL (ref 3.87–5.11)
RDW: 12.5 % (ref 11.5–15.5)
WBC: 6.4 10*3/uL (ref 4.0–10.5)
nRBC: 0 % (ref 0.0–0.2)

## 2019-09-30 LAB — MAGNESIUM: Magnesium: 1.9 mg/dL (ref 1.7–2.4)

## 2019-09-30 MED ORDER — AMIODARONE HCL 200 MG PO TABS: 200.0000 mg | ORAL_TABLET | Freq: Two times a day (BID) | ORAL | 6 refills | Status: DC

## 2019-09-30 MED ORDER — AMIODARONE HCL 200 MG PO TABS: 200.0000 mg | ORAL_TABLET | Freq: Every day | ORAL | 6 refills | Status: DC

## 2019-09-30 MED ORDER — DIGOXIN 62.5 MCG PO TABS: 0.0625 mg | ORAL_TABLET | Freq: Every day | ORAL | 6 refills | Status: DC

## 2019-09-30 MED ORDER — DIGOXIN 125 MCG PO TABS: 0.0625 mg | ORAL_TABLET | Freq: Every day | ORAL | 6 refills | Status: DC

## 2019-09-30 MED ORDER — SPIRONOLACTONE 25 MG PO TABS: 12.5000 mg | ORAL_TABLET | Freq: Every day | ORAL | 6 refills | Status: DC

## 2019-09-30 MED ORDER — FUROSEMIDE 20 MG PO TABS
20.0000 mg | ORAL_TABLET | Freq: Every day | ORAL | Status: DC
  Administered 2019-09-30: 10:00:00 20 mg via ORAL
  Filled 2019-09-30: qty 1

## 2019-09-30 NOTE — Progress Notes (Addendum)
Patient ID: Cheryl Vargas, female   DOB: Jan 11, 1935, 85 y.o.   MRN: [id number]     Advanced Heart Failure Rounding Note  PCP-Cardiologist: [name], MD   Subjective:    Patient admitted with presyncope and ICD shock, found to have had VT.  Recently, has had multiple episodes of VT, ATP.    No recent CP.   HS-TnI 33 => 51=>36.  BNP 1032.  Creatinine 1.46 => 1.09=>1.44>1.37.   Echo shows worsening LVEF at 20% (previously 25-30%), diffuse hypokinesis. No RWMAs. No LVH. RV systolic function mildly reduced.   RHC yesterday demonstrated mildly elevated right and left heart filling pressures, mild pulmonary venous hypertension and low CO. CI 1.7.    Had hypotension last PM, 40J systolic, and bisoprolol, losartan and spiro held. BP improved today, 811B systolic.    No complaints this am. No recurrent VT overnight. Feels fine. No dyspnea.   _______________________  [facility] 09/29/19 Procedural Findings: Hemodynamics (mmHg) RA mean 9 RV 40/8 PA 39/19, mean 29 PCWP mean 22 Oxygen saturations: PA 60% AO 98% Cardiac Output (Fick) 2.9  Cardiac Index (Fick) 1.72 PVR 2.4 WU    2D Echo 09/28/19 LVEF by visual estimation, is <20%. The left ventricle has severely decreased function. There is no left ventricular hypertrophy. 2. Moderately dilated left ventricular internal cavity size. 3. The left ventricle demonstrates global hypokinesis. 4. Global right ventricle has mildly reduced systolic function.The right ventricular size is mildly enlarged. No increase in right ventricular wall thickness. 5. Left atrial size was severely dilated. 6. Right atrial size was severely dilated. 7. Trivial pericardial effusion is present. 8. The mitral valve is normal in structure. Mild to moderate mitral valve regurgitation. 9. The tricuspid valve is normal in structure. 10. The aortic valve is tricuspid. Aortic valve regurgitation is mild. Mild aortic valve sclerosis without stenosis. 11. Pulmonic  regurgitation is mild. 12. The pulmonic valve was not well visualized. Pulmonic valve regurgitation is mild. 13. Moderately elevated pulmonary artery systolic pressure. 14. A pacer wire is visualized. 15. The inferior vena cava is dilated in size with <50% respiratory variability, suggesting right atrial pressure of 15 mmHg. 16. The left ventricular function has worsened. 17. No intracardiac thrombi or masses were visualized. 18. Prior images reviewed side by side. In comparison to the previous echocardiogram(s): The left ventricular function is worsened.  Objective:   Weight Range: 61.3 kg Body mass index is 22.48 kg/m.   Vital Signs:   Temp:  [97.5 F (36.4 C)-98.2 F (36.8 C)] 98.2 F (36.8 C) (01/19 0627) Pulse Rate:  [76-85] 80 (01/18 2147) Resp:  [12-19] 18 (01/18 2147) BP: (89-129)/(50-83) 89/51 (01/18 2147) SpO2:  [96 %-100 %] 97 % (01/18 2147) Weight:  [61.3 kg] 61.3 kg (01/19 0627)    Weight change: Filed Weights   09/28/19 1117 09/29/19 0405 09/30/19 0627  Weight: 63.9 kg 61.8 kg 61.3 kg    Intake/Output:   Intake/Output Summary (Last 24 hours) at 09/30/2019 0711 Last data filed at 09/30/2019 0000 Gross per 24 hour  Intake 960 ml  Output 1550 ml  Net -590 ml      Physical Exam    PHYSICAL EXAM: General:  Well appearing. No respiratory difficulty HEENT: normal Neck: supple. no JVD. Carotids 2+ bilat; no bruits. No lymphadenopathy or thyromegaly appreciated. Cor: PMI nondisplaced. Regular rate & rhythm. No rubs, gallops or murmurs. Lungs: clear Abdomen: soft, nontender, nondistended. No hepatosplenomegaly. No bruits or masses. Good bowel sounds. Extremities: no cyanosis, clubbing, rash, edema Neuro:  alert & oriented x 3, cranial nerves grossly intact. moves all 4 extremities w/o difficulty. Affect pleasant.    Telemetry   Atrial fibrillation with BiV pacing 80s , no recurrent VT (personally reviewed)   Labs    CBC Recent Labs     09/27/19 1708 09/27/19 1708 09/29/19 0318 09/29/19 0318 09/29/19 0822 09/30/19 0349  WBC 7.7   < > 6.4  --   --  6.4  NEUTROABS 5.9  --   --   --   --   --   HGB 16.0*   < > 15.0   < > 15.0 15.5*  HCT 47.5*   < > 43.3   < > 44.0 43.9  MCV 95.4   < > 92.7  --   --  92.4  PLT 185   < > 153  --   --  176   < > = values in this interval not displayed.   Basic Metabolic Panel Recent Labs    09/29/19 0318 09/29/19 0318 09/29/19 0822 09/30/19 0349  NA 139   < > 141 140  K 4.2   < > 3.9 4.1  CL 107  --   --  100  CO2 22  --   --  27  GLUCOSE 111*  --   --  92  BUN 30*  --   --  34*  CREATININE 1.44*  --   --  1.37*  CALCIUM 8.9  --   --  8.8*  MG 2.0  --   --  1.9   < > = values in this interval not displayed.   Liver Function Tests Recent Labs    09/27/19 1708  AST 62*  ALT 48*  ALKPHOS 64  BILITOT 1.0  PROT 6.7  ALBUMIN 3.5   No results for input(s): LIPASE, AMYLASE in the last 72 hours. Cardiac Enzymes No results for input(s): CKTOTAL, CKMB, CKMBINDEX, TROPONINI in the last 72 hours.  BNP: BNP (last 3 results) Recent Labs    05/05/19 2051 09/27/19 1708  BNP 1,069.2* 1,032.7*    ProBNP (last 3 results) No results for input(s): PROBNP in the last 8760 hours.   D-Dimer No results for input(s): DDIMER in the last 72 hours. Hemoglobin A1C No results for input(s): HGBA1C in the last 72 hours. Fasting Lipid Panel No results for input(s): CHOL, HDL, LDLCALC, TRIG, CHOLHDL, LDLDIRECT in the last 72 hours. Thyroid Function Tests Recent Labs    09/28/19 0155  TSH 2.672    Other results:   Imaging    CARDIAC CATHETERIZATION  Result Date: 09/29/2019 1. Mildly elevated right and left heart filling pressures. 2. Mild pulmonary venous hypertension. 3. Low cardiac output.     Medications:     Scheduled Medications: . amiodarone  200 mg Oral BID  . bisoprolol  2.5 mg Oral BID  . digoxin  0.0625 mg Oral Daily  . famotidine  20 mg Oral Daily  .  furosemide  40 mg Intravenous BID  . losartan  12.5 mg Oral QHS  . rivaroxaban  15 mg Oral Q supper  . sodium chloride flush  3 mL Intravenous Q12H  . sodium chloride flush  3 mL Intravenous Q12H  . spironolactone  12.5 mg Oral Daily    Infusions: . sodium chloride      PRN Medications: sodium chloride, acetaminophen, docusate sodium, LORazepam, sodium chloride flush, zolpidem   Assessment/Plan   1. VT: Episodes of VT/NSVT recently with ATP and ultimately ICD  discharge.  She has had pulseless VT in the past.  She had presyncope with current event.  In the past, has not tolerated amiodarone, sotalol, Tikosyn, mexiletine, or ranolazine.  However, she is willing to retry amiodarone. Troponin minimally elevated  33 => 51=>36, suspect most likely demand ischemia with CHF exacerbation. Doubt ACS, no chest pain.  Suspect VT may have been triggered by volume overload/CHF. RHC 1/18 w/ elevated filling pressures and low CO. CI 1.7. Discussed with Dr. Rayann Heman as well. Continue amiodarone. Will send home on low dose 200 mg daily given difficulty tolerating amiodarone in the past. Will need outpatient baseline PFTs w/ DLCO + annual pulmonary assessment w/ F/u PFTs +/- CXRs, TFTs and HFTs q6 months and annual eye exams.  - Dr Rayann Heman to adjust ATP - If further VT, ablation may be option.  - Continue bisoprolol 2.5 bid.  - Keep K >4.0 and Mg > 2.0 - No driving x 6 months.  2. Chronic systolic HF: NICM, LHC 10/7780 showed no significant coronary disease. Cardiac MRI 10/17 EF 36%, diffuse HK, normal RV, biatrial enlargement, moderate MR, non-coronary LGE pattern involving the mid-wall of the basal to mid septum and inferior wall. Possibly prior myocarditis versus a form of infiltrative disease. Echo in 8/20 showed EF 25-30%, severe diffuse HK, normal RV, moderate-severe TR.  [employer] CRT-D s/p AV nodal ablation.  NYHA class III symptoms at home, worse recently.  Optivol has been suggestive of increasing volume  recently  - Repeat Echo this admit w/ worsening LV systolic function, EF now <20% (previously 25-30%). No RWMAs. No LVH. RV systolic function mildly reduced.  - RHC this admit demonstrated mildly elevated right and left heart filling pressures, mild pulmonary venous hypertension and low CO. CI 1.7.  Additional IV Lasix 40 mg x2 given yesterday w/ -1.5L in UOP.  - Volume improved. Transition back to PO Lasix today. -Continue bisoprolol 2.5 mg bid.  Unable to tolerate increase.   -Continuelosartan 12.5 mg qHS. Unable to tolerate increase.  - Continue spironolactone 12.5 daily.  Unable to tolerate increase.  - Digoxin added. 0.0625 mg daily  - Given her age and a degree of frailty, think that LVAD would be difficult for her.  3. Atrial fibrillation: Chronic, s/p AV nodal ablation and BiV pacing.   - Continue Xarelto for a/c   4. PVCs: Frequent PVCs have limited BiV pacing.  Not a good PVC ablation candidate per EP. Cannot tolerate amiodarone, sotolol, or Tikosyn. Did not tolerate mexilitine with nausea. Most recently, stopped ranolazine due to nausea. Additionally, did not tolerate further uptitration of bisoprolol.  - As above, we are going to try her again on amiodarone, 200 mg daily  5. Mitral regurgitation: Mild on 8/20 echo. - mild to moderate on echo 1/21  Dispo: possible d/c home today. Will arrange clinic f/u.  Length of Stay: [address], PA-C  09/30/2019, 7:11 AM  Advanced Heart Failure Team Pager [phone] (M-F; 7a - 4p)  Please contact [facility] for night-coverage after hours (4p -7a ) and weekends on [email or web address]  Patient seen with PA, agree with the above note.  Some meds held for low BP overnight but no lightheadedness.  Would tolerate SBP > 85 as long as she is not lightheaded.   No dyspnea, weight down.  I think she can go home today.   Meds for home: amiodarone 200 mg daily (will send home on low dose as she has had difficulty with this in the past due  to  nausea), digoxin 0.0625 mg daily, spironolactone 12.5 daily, losartan 12.5 mg qhs, bisoprolol 2.5 mg bid, Xarelto 15 mg daily, Lasix 20 mg daily.  She will need followup in CHF clinic in 10-14 days, followup with EP as well.  She will need BMET + digoxin level drawn in 1 week.   [name] 09/30/2019 8:43 AM

## 2019-09-30 NOTE — Progress Notes (Signed)
CARDIAC REHAB PHASE I   PRE:  Rate/Rhythm: 80 paced    BP: lying 106/71, standing 93/61    SaO2:   MODE:  Ambulation: 3 laps in room   POST:  Rate/Rhythm: 86 pacing    BP: sitting 108/91     SaO2:   Pt eventually agreed to OOB and walking in room so I could check her BP and stability. Slightly weak but no assist needed. BP stable. Reviewed HF booklet and low sodium. She struggles with what to eat due to sodium restrictions and what her stomach can handle (can't eat salads, nuts, beans). Eats small meals. Encouraged asking for help from her children and family. Also discussed using [employer] to help her get groceries since she can't drive now. Did not give exercise guidelines due VT but she sits and pedals for 45 min daily. [place], ACSM 09/30/2019 10:40 AM

## 2019-09-30 NOTE — Discharge Instructions (Addendum)
Heart Failure, Diagnosis  Heart failure means that your heart is not able to pump blood in the right way. This makes it hard for your body to work well. Heart failure is usually a long-term (chronic) condition. You must take good care of yourself and follow your treatment plan from your doctor. What are the causes? This condition may be caused by:  High blood pressure.  Build up of cholesterol and fat in the arteries.  Heart attack. This injures the heart muscle.  Heart valves that do not open and close properly.  Damage of the heart muscle. This is also called cardiomyopathy.  Lung disease.  Abnormal heart rhythms. What increases the risk? The risk of heart failure goes up as a person ages. This condition is also more likely to develop in people who:  Are overweight.  Are female.  Smoke or chew tobacco.  Abuse alcohol or illegal drugs.  Have taken medicines that can damage the heart.  Have diabetes.  Have abnormal heart rhythms.  Have thyroid problems.  Have low blood counts (anemia). What are the signs or symptoms? Symptoms of this condition include:  Shortness of breath.  Coughing.  Swelling of the feet, ankles, legs, or belly.  Losing weight for no reason.  Trouble breathing.  Waking from sleep because of the need to sit up and get more air.  Rapid heartbeat.  Being very tired.  Feeling dizzy, or feeling like you may pass out (faint).  Having no desire to eat.  Feeling like you may vomit (nauseous).  Peeing (urinating) more at night.  Feeling confused. How is this treated?     This condition may be treated with:  Medicines. These can be given to treat blood pressure and to make the heart muscles stronger.  Changes in your daily life. These may include eating a healthy diet, staying at a healthy body weight, quitting tobacco and illegal drug use, or doing exercises.  Surgery. Surgery can be done to open blocked valves, or to put devices in  the heart, such as pacemakers.  A donor heart (heart transplant). You will receive a healthy heart from a donor. Follow these instructions at home:  Treat other conditions as told by your doctor. These may include high blood pressure, diabetes, thyroid disease, or abnormal heart rhythms.  Learn as much as you can about heart failure.  Get support as you need it.  Keep all follow-up visits as told by your doctor. This is important. Summary  Heart failure means that your heart is not able to pump blood in the right way.  This condition is caused by high blood pressure, heart attack, or damage of the heart muscle.  Symptoms of this condition include shortness of breath and swelling of the feet, ankles, legs, or belly. You may also feel very tired or feel like you may vomit.  You may be treated with medicines, surgery, or changes in your daily life.  Treat other health conditions as told by your doctor. This information is not intended to replace advice given to you by your health care provider. Make sure you discuss any questions you have with your health care provider. Document Revised: 11/15/2018 Document Reviewed: 11/15/2018 Elsevier Patient Education  [place]. Hypotension As your heart beats, it forces blood through your body. This force is called blood pressure. If you have hypotension, you have low blood pressure. When your blood pressure is too low, you may not get enough blood to your brain or other  parts of your body. This may cause you to feel weak, light-headed, have a fast heartbeat, or even pass out (faint). Low blood pressure may be harmless, or it may cause serious problems. What are the causes?  Blood loss.  Not enough water in the body (dehydration).  Heart problems.  Hormone problems.  Pregnancy.  A very bad infection.  Not having enough of certain nutrients.  Very bad allergic reactions.  Certain medicines. What increases the risk?  Age. The  risk increases as you get older.  Conditions that affect the heart or the brain and spinal cord (central nervous system).  Taking certain medicines.  Being pregnant. What are the signs or symptoms?  Feeling: ? Weak. ? Light-headed. ? Dizzy. ? Tired (fatigued).  Blurred vision.  Fast heartbeat.  Passing out, in very bad cases. How is this treated?  Changing your diet. This may involve eating more salt (sodium) or drinking more water.  Taking medicines to raise your blood pressure.  Changing how much you take (the dosage) of some of your medicines.  Wearing compression stockings. These stockings help to prevent blood clots and reduce swelling in your legs. In some cases, you may need to go to the hospital for:  Fluid replacement. This means you will receive fluids through an IV tube.  Blood replacement. This means you will receive donated blood through an IV tube (transfusion).  Treating an infection or heart problems, if this applies.  Monitoring. You may need to be monitored while medicines that you are taking wear off. Follow these instructions at home: Eating and drinking   Drink enough fluids to keep your pee (urine) pale yellow.  Eat a healthy diet. Follow instructions from your doctor about what you can eat or drink. A healthy diet includes: ? Fresh fruits and vegetables. ? Whole grains. ? Low-fat (lean) meats. ? Low-fat dairy products.  Eat extra salt only as told. Do not add extra salt to your diet unless your doctor tells you to.  Eat small meals often.  Avoid standing up quickly after you eat. Medicines  Take over-the-counter and prescription medicines only as told by your doctor. ? Follow instructions from your doctor about changing how much you take of your medicines, if this applies. ? Do not stop or change any of your medicines on your own. General instructions   Wear compression stockings as told by your doctor.  Get up slowly from lying  down or sitting.  Avoid hot showers and a lot of heat as told by your doctor.  Return to your normal activities as told by your doctor. Ask what activities are safe for you.  Do not use any products that contain nicotine or tobacco, such as cigarettes, e-cigarettes, and chewing tobacco. If you need help quitting, ask your doctor.  Keep all follow-up visits as told by your doctor. This is important. Contact a doctor if:  You throw up (vomit).  You have watery poop (diarrhea).  You have a fever for more than 2-3 days.  You feel more thirsty than normal.  You feel weak and tired. Get help right away if:  You have chest pain.  You have a fast or uneven heartbeat.  You lose feeling (have numbness) in any part of your body.  You cannot move your arms or your legs.  You have trouble talking.  You get sweaty or feel light-headed.  You pass out.  You have trouble breathing.  You have trouble staying awake.  You feel  mixed up (confused). Summary  Hypotension is also called low blood pressure. It is when the force of blood pumping through your arteries is too weak.  Hypotension may be harmless, or it may cause serious problems.  Treatment may include changing your diet and medicines, and wearing compression stockings.  In very bad cases, you may need to go to the hospital. This information is not intended to replace advice given to you by your health care provider. Make sure you discuss any questions you have with your health care provider. Document Revised: 02/21/2018 Document Reviewed: 02/21/2018 Elsevier Patient Education  [place]. Ventricular Tachycardia  Ventricular tachycardia is a fast heartbeat that begins in the lower chambers of the heart (ventricles). It is a type of abnormal heart rhythm (arrhythmia). A normal heartbeat usually starts when an area in the heart called the sinoatrial (SA) node releases an electrical signal. With ventricular  tachycardia, electrical signals in the lower part of the heart fire abnormally and interfere with the electrical signals sent out by the SA node. A normal heart rate is 60-100 beats per minute. During an episode of ventricular tachycardia, the heart reaches 100 beats per minute or higher. This condition can be life-threatening and should be treated immediately. What are the causes? This condition is caused by abnormal electrical activity in the lower part of the heart. This may result from:  Medicines.  Diseases of the heart muscle (cardiomyopathy).  The heart not getting enough oxygen. This may be caused by blood flow problems in the arteries.  An inflammatory disease that affects multiple areas of the body (sarcoidosis).  Drug use, such as cocaine, amphetamine, or anabolic steroid use. What increases the risk? You are more likely to develop this condition if:  You have had a heart attack.  You have: ? Heart failure or cardiomyopathy. ? Heart defects that you were born with (congenital heart defects). ? Abnormal heart tissue. ? Heart valves that leak or are narrow. ? Diabetes. ? An infection that affects the heart. ? High blood pressure. ? An overactive or underactive thyroid. ? Sleep apnea. ? A family history of stopped heartbeat (cardiac arrest) or coronary artery disease. ? High cholesterol.  You smoke.  You drink alcohol heavily.  You use drugs, such as cocaine. What are the signs or symptoms? Symptoms of this condition include:  A pounding heartbeat.  Feeling as if your heart is skipping beats or fluttering (palpitations).  Shortness of breath.  Anxiety.  Dizziness.  Light-headedness.  Fainting.  Chest pain.  Cardiac arrest caused by an irregular heartbeat (arrhythmia). How is this diagnosed? This condition may be diagnosed based on:  Your symptoms and medical history.  A physical exam.  Electrocardiogram (ECG). This test is done to check for  problems with electrical activity in the heart.  Holter monitor or event monitor test. This test involves wearing a portable device that monitors your heart rate over time. You may also have other tests, including:  Blood tests.  Chest X-ray.  Echocardiogram. This test involves using sound waves to create images of the heart.  Angiogram. During this test, dye is injected into your bloodstream, and then X-rays are taken. The dye lets your health care provider see how blood flows through your arteries.  Exercise stress test. During this test, an ECG is done while you exercise on a treadmill.  Cardiac CT scan or cardiac MRI. How is this treated? Treatment for this condition depends on the cause. Treatment may include:  Medicines  that slow the heart rate and return it to a normal rhythm (anti-arrhythmics).  An electric shock (cardioversion) that makes the heart go back to a normal rhythm.  An electrophysiology study. This procedure can help locate areas of heart tissue that are causing rapid heartbeats. ? In this procedure, a thin, flexible tube (catheter) is inserted into one of your veins and moved to your heart to evaluate your heart's electrical activity. ? In some cases, the heart tissue that is causing problems may be killed with radiofrequency energy delivered through the catheter (radiofrequency ablation). This may help your heart keep a normal rhythm.  An implantable cardioverter defibrillator (ICD). This is a small device that monitors your heartbeat. When it senses an irregular heartbeat, it sends a shock to bring the heartbeat back to normal. The ICD is implanted under the skin in the chest.  Surgery to improve blood flow to the heart.  Genetic counseling to check whether your family members are at risk for ventricular tachycardia. Follow these instructions at home: Lifestyle  Do not use any products that contain nicotine or tobacco, such as cigarettes and e-cigarettes. If  you need help quitting, ask your health care provider.  Do not use stimulant drugs, such as cocaine or methamphetamines.  Maintain a healthy weight.  Manage stress. Try to do this with relaxation exercises, yoga, quiet time, or meditation. Eating and drinking  Eat a healthy diet. This includes plenty of fruits and vegetables, whole grains, lean meats, and low-fat or fat-free dairy products.  Avoid eating foods that are high in saturated fat, trans fat, sugar, or salt (sodium).  Ask your health care provider if you may drink alcohol. ? If alcohol triggers episodes of ventricular tachycardia, do not drink alcohol. ? If alcohol does not trigger episodes, limit alcohol intake to no more than 1 drink a day for nonpregnant women and 2 drinks a day for men. One drink equals 12 oz of beer, 5 oz of wine, or 1 oz of hard liquor. General instructions  Take over-the-counter and prescription medicines only as told by your health care provider.  Exercise regularly. Aim for 150 minutes of moderate exercise or 75 minutes of vigorous exercise per week. Ask your health care provider what exercises are safe for you.  Keep all follow-up visits as told by your health care provider. This is important. Contact a health care provider if:  Your symptoms get worse.  You develop new symptoms, such as new palpitations.  You feel depressed. Get help right away if:  You have an episode of ventricular tachycardia that lasts 30 seconds or more.  You have chest pain.  You have trouble breathing. These symptoms may represent a serious problem that is an emergency. Do not wait to see if the symptoms will go away. Get medical help right away. Call your local emergency services (911 in the [place] Do not drive yourself to the hospital. Summary  Ventricular tachycardia is a fast heartbeat that begins in the lower chambers of the heart. This condition can be life-threatening and should be treated  immediately.  This condition may be treated with medicines, electric shock, radiofrequency energy, surgery, or insertion of an implantable cardioverter defibrillator (ICD).  Get help right away if you have chest pain, trouble breathing, or ventricular tachycardia symptoms that last more than 30 seconds. This information is not intended to replace advice given to you by your health care provider. Make sure you discuss any questions you have with your health care provider.  Document Revised: 08/10/2017 Document Reviewed: 10/19/2016 Elsevier Patient Education  [place]. Amiodarone tablets What is this medicine? AMIODARONE (a MEE oh da rone) is an antiarrhythmic drug. It helps make your heart beat regularly. Because of the side effects caused by this medicine, it is only used when other medicines have not worked. It is usually used for heartbeat problems that may be life threatening. This medicine may be used for other purposes; ask your health care provider or pharmacist if you have questions. COMMON BRAND NAME(S): Cordarone, Pacerone What should I tell my health care provider before I take this medicine? They need to know if you have any of these conditions:  liver disease  lung disease  other heart problems  thyroid disease  an unusual or allergic reaction to amiodarone, iodine, other medicines, foods, dyes, or preservatives  pregnant or trying to get pregnant  breast-feeding How should I use this medicine? Take this medicine by mouth with a glass of water. Follow the directions on the prescription label. You can take this medicine with or without food. However, you should always take it the same way each time. Take your doses at regular intervals. Do not take your medicine more often than directed. Do not stop taking except on the advice of your doctor or health care professional. A special MedGuide will be given to you by the pharmacist with each prescription and refill. Be  sure to read this information carefully each time. Talk to your pediatrician regarding the use of this medicine in children. Special care may be needed. Overdosage: If you think you have taken too much of this medicine contact a poison control center or emergency room at once. NOTE: This medicine is only for you. Do not share this medicine with others. What if I miss a dose? If you miss a dose, take it as soon as you can. If it is almost time for your next dose, take only that dose. Do not take double or extra doses. What may interact with this medicine? Do not take this medicine with any of the following medications:  abarelix  apomorphine  arsenic trioxide  certain antibiotics like erythromycin, gemifloxacin, levofloxacin, pentamidine  certain medicines for depression like amoxapine, tricyclic antidepressants  certain medicines for fungal infections like fluconazole, itraconazole, ketoconazole, posaconazole, voriconazole  certain medicines for irregular heart beat like disopyramide, dronedarone, ibutilide, propafenone, sotalol  certain medicines for malaria like chloroquine, halofantrine  cisapride  droperidol  haloperidol  [place]  maprotiline  methadone  phenothiazines like chlorpromazine, mesoridazine, thioridazine  pimozide  ranolazine  red yeast rice  vardenafil This medicine may also interact with the following medications:  antiviral medicines for HIV or AIDS  certain medicines for blood pressure, heart disease, irregular heart beat  certain medicines for cholesterol like atorvastatin, cerivastatin, lovastatin, simvastatin  certain medicines for hepatitis C like sofosbuvir and ledipasvir; sofosbuvir  certain medicines for seizures like phenytoin  certain medicines for thyroid problems  certain medicines that treat or prevent blood clots like  warfarin  cholestyramine  cimetidine  clopidogrel  cyclosporine  dextromethorphan  diuretics  dofetilide  fentanyl  general anesthetics  grapefruit juice  lidocaine  loratadine  methotrexate  other medicines that prolong the QT interval (cause an abnormal heart rhythm)  procainamide  quinidine  rifabutin, rifampin, or rifapentine  St. John's Wort  trazodone  ziprasidone This list may not describe all possible interactions. Give your health care provider a list of all the medicines, herbs, non-prescription drugs, or dietary supplements  you use. Also tell them if you smoke, drink alcohol, or use illegal drugs. Some items may interact with your medicine. What should I watch for while using this medicine? Your condition will be monitored closely when you first begin therapy. Often, this drug is first started in a hospital or other monitored health care setting. Once you are on maintenance therapy, visit your doctor or health care professional for regular checks on your progress. Because your condition and use of this medicine carry some risk, it is a good idea to carry an identification card, necklace or bracelet with details of your condition, medications, and doctor or health care professional. Dennis Bast may get drowsy or dizzy. Do not drive, use machinery, or do anything that needs mental alertness until you know how this medicine affects you. Do not stand or sit up quickly, especially if you are an older patient. This reduces the risk of dizzy or fainting spells. This medicine can make you more sensitive to the sun. Keep out of the sun. If you cannot avoid being in the sun, wear protective clothing and use sunscreen. Do not use sun lamps or tanning beds/booths. You should have regular eye exams before and during treatment. Call your doctor if you have blurred vision, see halos, or your eyes become sensitive to light. Your eyes may get dry. It may be helpful to use a  lubricating eye solution or artificial tears solution. If you are going to have surgery or a procedure that requires contrast dyes, tell your doctor or health care professional that you are taking this medicine. What side effects may I notice from receiving this medicine? Side effects that you should report to your doctor or health care professional as soon as possible:  allergic reactions like skin rash, itching or hives, swelling of the face, lips, or tongue  blue-gray coloring of the skin  blurred vision, seeing blue green halos, increased sensitivity of the eyes to light  breathing problems  chest pain  dark urine  fast, irregular heartbeat  feeling faint or light-headed  intolerance to heat or cold  nausea or vomiting  pain and swelling of the scrotum  pain, tingling, numbness in feet, hands  redness, blistering, peeling or loosening of the skin, including inside the mouth  spitting up blood  stomach pain  sweating  unusual or uncontrolled movements of body  unusually weak or tired  weight gain or loss  yellowing of the eyes or skin Side effects that usually do not require medical attention (report to your doctor or health care professional if they continue or are bothersome):  change in sex drive or performance  constipation  dizziness  headache  loss of appetite  trouble sleeping This list may not describe all possible side effects. Call your doctor for medical advice about side effects. You may report side effects to FDA at 1-800-FDA-1088. Where should I keep my medicine? Keep out of the reach of children. Store at room temperature between 20 and 25 degrees C (68 and 77 degrees F). Protect from light. Keep container tightly closed. Throw away any unused medicine after the expiration date. NOTE: This sheet is a summary. It may not cover all possible information. If you have questions about this medicine, talk to your doctor, pharmacist, or health  care provider.  2020 Elsevier/Gold Standard (2018-07-31 13:44:04)

## 2019-09-30 NOTE — Care Management (Signed)
1010 09-30-19 Case Manager spoke with patient regarding home and if she needed home health services. Patient feels that she is pretty active and declines home health services at this time. Patient states she has family and neighbors that check in on her if she needs anything. Case Manager made patient aware to contact her primary care provider if she needs home health services in the future. No further needs from Case Manager at this time. [name], RN,BSN Case Manager

## 2019-09-30 NOTE — Progress Notes (Signed)
PT Cancellation Note  Patient Details Name: Cheryl Vargas MRN: [id number] DOB: Feb 04, 1935   Cancelled Treatment:    Reason Eval/Treat Not Completed: Patient declined, no reason specified patient continues to expressly refuse therapy evaluation, offering multiple non-significant reasons including she is active at home, she has the [place] in, and she "looks a mess"/does not have a robe to put over her shoulders. Not even agreeable to in room session and continues to refuse when educated MD wanted her up and moving today. States she does not want PT because she is active every day at home and she will "be OK".   PT signing off due to multiple refusals and zero interest from patient in participating in PT. Thank you for the referral.     Ann Lions PT, DPT, PN1   Supplemental Physical Therapist [place]    Pager [phone] Acute Rehab Office [phone]

## 2019-10-08 ENCOUNTER — Telehealth: Payer: Self-pay

## 2019-10-09 ENCOUNTER — Encounter (HOSPITAL_COMMUNITY): Payer: Self-pay

## 2019-10-09 ENCOUNTER — Other Ambulatory Visit: Payer: Self-pay

## 2019-10-09 ENCOUNTER — Ambulatory Visit (HOSPITAL_COMMUNITY)
Admission: RE | Admit: 2019-10-09 | Discharge: 2019-10-09 | Disposition: A | Discharge status: Home / Self Care | Payer: PPO | Source: Ambulatory Visit | Attending: Adult Health | Admitting: Adult Health

## 2019-10-09 VITALS — BP 107/59 | HR 74 | Wt 136.2 lb

## 2019-10-09 DIAGNOSIS — I110 Hypertensive heart disease with heart failure: Secondary | ICD-10-CM | POA: Insufficient documentation

## 2019-10-09 DIAGNOSIS — I482 Chronic atrial fibrillation, unspecified: Secondary | ICD-10-CM | POA: Insufficient documentation

## 2019-10-09 DIAGNOSIS — Z79899 Other long term (current) drug therapy: Secondary | ICD-10-CM | POA: Diagnosis not present

## 2019-10-09 DIAGNOSIS — Z95810 Presence of automatic (implantable) cardiac defibrillator: Secondary | ICD-10-CM | POA: Diagnosis not present

## 2019-10-09 DIAGNOSIS — I081 Rheumatic disorders of both mitral and tricuspid valves: Secondary | ICD-10-CM | POA: Diagnosis not present

## 2019-10-09 DIAGNOSIS — I428 Other cardiomyopathies: Secondary | ICD-10-CM

## 2019-10-09 DIAGNOSIS — I5022 Chronic systolic (congestive) heart failure: Secondary | ICD-10-CM | POA: Diagnosis not present

## 2019-10-09 DIAGNOSIS — Z8249 Family history of ischemic heart disease and other diseases of the circulatory system: Secondary | ICD-10-CM | POA: Insufficient documentation

## 2019-10-09 DIAGNOSIS — Z7901 Long term (current) use of anticoagulants: Secondary | ICD-10-CM | POA: Insufficient documentation

## 2019-10-09 DIAGNOSIS — I472 Ventricular tachycardia, unspecified: Secondary | ICD-10-CM

## 2019-10-09 DIAGNOSIS — I509 Heart failure, unspecified: Secondary | ICD-10-CM

## 2019-10-09 DIAGNOSIS — I493 Ventricular premature depolarization: Secondary | ICD-10-CM

## 2019-10-09 LAB — BASIC METABOLIC PANEL
Anion gap: 9 (ref 5–15)
BUN: 35 mg/dL — ABNORMAL HIGH (ref 8–23)
CO2: 23 mmol/L (ref 22–32)
Calcium: 8.7 mg/dL — ABNORMAL LOW (ref 8.9–10.3)
Chloride: 104 mmol/L (ref 98–111)
Creatinine, Ser: 1.36 mg/dL — ABNORMAL HIGH (ref 0.44–1.00)
GFR calc Af Amer: 42 mL/min — ABNORMAL LOW (ref >60)
GFR calc non Af Amer: 36 mL/min — ABNORMAL LOW (ref >60)
Glucose, Bld: 94 mg/dL (ref 70–99)
Potassium: 3.6 mmol/L (ref 3.5–5.1)
Sodium: 136 mmol/L (ref 135–145)

## 2019-10-09 LAB — DIGOXIN LEVEL: Digoxin Level: <0.2 ng/mL — ABNORMAL LOW (ref 0.8–2.0)

## 2019-10-09 MED ORDER — LORAZEPAM 0.5 MG PO TABS: 0.5000 mg | ORAL_TABLET | Freq: Every day | ORAL | 0 refills | Status: DC | PRN

## 2019-10-09 NOTE — Patient Instructions (Signed)
Lab work done today. We will notify you of any abnormal lab work. No news is good news!  Please follow up with the [facility] in 2 months.  At the [facility], you and your health needs are our priority. As part of our continuing mission to provide you with exceptional heart care, we have created designated Provider Care Teams. These Care Teams include your primary Cardiologist (physician) and Advanced Practice Providers (APPs- Physician Assistants and Nurse Practitioners) who all work together to provide you with the care you need, when you need it.   You may see any of the following providers on your designated Care Team at your next follow up: Marland Kitchen Dr Glori Bickers . Dr [name] . Darrick Grinder, NP . Lyda Jester, PA . Audry Riles, PharmD   Please be sure to bring in all your medications bottles to every appointment.

## 2019-10-09 NOTE — Progress Notes (Signed)
Advanced Heart Failure Clinic Note   Primary Care: Dr Laurann Montana HF Cardiology: Dr. Aundra Dubin  HPI: Cheryl Vargas is an 85 y.o. female, retired Marine scientist, with a past medical history of HTN, HLD, PAF (On Xarelto), and nonischemic cardiomyopathy with chronic systolic CHF. Cardiomyopathy dates back to at least 2014 based on echoes (EF 30-35% in 2014).   Admitted 8/17 for fatigue and dyspnea.  She was seen by electrophysiology on 04/17/16, and was started on po Amiodarone as she reported increased palpitations and fatigue with frequent PVCs. Her BNP was elevated at 1592.6 and chest x ray was consistent with CHF.  She was diuresed.  With her complaints of new onset fatigue and dyspnea combined with her known LV dysfunction, it was felt that she would benefit from an ischemic evaluation by cath. She underwent left heart cath with normal cors. Amiodarone was later stopped due to nausea.  Discharge weight 143 pounds. Echo (8/17) with EF 40-45%, inferoseptal akinesis. Cardiac MRI 10/17 with EF 36%, LGE mid wall pattern in the basal to mid septum and inferior wall. Holter monitor in 9/17 with runs of atrial fibrillation/RVR.  She has had trouble tolerating cardiac meds due to hypotension/lightheadedness.    She has had difficulty with symptomatic atrial fibrillation. She developed symptomatic atrial fibrillation with RVR again in 10/18, difficult to control rate.  She had TEE-guided DCCV with resumption of NSR. On TEE in rapid atrial fibrillation, EF 20-25%.  On TTE after DCCV, EF back to 40-45% range.  MR reported as moderate to severe but I reviewed echo and think it appears more in the moderate range.    She had recurrent atrial fibrillation after 10/18 DCCV. She was admitted in 12/18 with atrial fibrillation and RVR, she also had a run of WCT thought to be VT.   The atrial fibrillation was very difficult to control.  She was thought to be a poor candidate for atrial fibrillation ablation.  She finally had AV nodal  ablation with Medtronic CRT-D device in 12/18. She was then admitted briefly with CHF in 1/19.  Echo in 12/18 showed EF 25-30%, moderate MR, moderate TR, severe LAE.  Echo in 4/19 showed EF 25-30%, diffuse hypokinesis, mild MR, moderately decreased RV systolic function.   Admitted 4/7 - 12/21/17 with A/C CHF. Found to have decreased BiV pacing in setting of frequent PVCs. Tried on Mexiletine but failed due to nausea. Switched to Ranexa. Diuresed with IV lasix.   In 5/19, she was admitted with a near-syncopal episode that occurred while straining for a bowel movement.  Suspected vagal event.   She has stopped ranolazine due to nausea.  She did not tolerate increase in bisoprolol.   She was admitted in 8/20 with presyncope; she would get abdominal pain followed by a feeling of warmth and diaphoresis, then she would get dizzy.  No syncope. No explanatory arrhythmias were seen on device interrogation or telemetry in the hospital.  She was not orthostatic or dry.  Thought to be possibly a vagal response to GI trigger (dyspepsia).  Mesenteric dopplers in 8/20 were normal. She feels like Pepcid has helped.   Admitted 09/27/19 throught 09/30/2019.  Episodes of VT/NSVT recently with ATP and ultimately ICD discharge. Amio was increased to 200 mg twice a day then dropped to amio 200 mg daily. RHC completed and showed low output so digoxin was added.   Today she returns for HF follow up.Overall feeling ok. Complaining of anxiety in the afternoon. Says she is anxious after she exerts herself.  Says this seems is related to ICD shock. Ativan has helped with this.  Also complaining of constipation. Denies SOB/PND/Orthopnea. Appetite ok. No fever or chills. Weight at home trending down 138-->134  pounds. Taking all medications.   Labs (8/17): K 4.2, creatinine 0.85, BNP 1593, HCT 43.2, TSH mild elevated 5.3, free T4 normal Labs (9/17): K 4.1, creatinine 0.98 => 0.92, BNP 258, SPEP negative Labs (2/18): K 4, creatinine  0.83, HCT 40.8 Labs (10/18): K 4.2, creatinine 0.94, hgb 14.9 Labs (1/19): K 4.9, creatinine 0.72 Labs (2/19): K 4.1, creatinine 0.97 Labs (5/19): K 3.4, creatinine 1.17 Labs (6/19): K 3.9, creatinine 1.2 Labs (8/19): K 3.7, creatinine 1.14 Labs (1/20): K 3.9, creatinine 1.25 Labs (8/20): K 4.1, creatinine 1.0, hgb 14.4  1. Chronic systolic CHF:  Nonischemic cardiomyopathy.   - Echo (2014): EF 30-35%. - Echo (2/17): EF 40-45% - Echo (8/17): EF 40-45%, mid to apical inferoseptal akinesis - Coronary angiography (8/17): No significant CAD.  - Cardiac MRI (10/17): EF 36%, moderate MR, normal RV size and systolic function, mid-wall LGE in the basal to mid septum and inferior wall.  - TEE (10/18): EF 20-25% but in rapid afib with HR up to 150 bpm, mild MR.  - Echo (10/18): EF 40-45%, diffuse hypokinesis, moderate LAE, reportedly moderate-severe MR (looks moderate on my review).  - Echo (12/18): EF 25-30%, moderate MR, moderate TR, severe LAE - Medtronic CRT-D device s/p AV nodal ablation.  - Echo (4/19): EF 25-30%, diffuse hypokinesis, mild MR, moderately decreased RV systolic function.  - Echo (8/20): EF 25-30%, normal RV size and systolic function, mild MR, moderate-severe TR.  -ECHO 09/2019: EF < 20%  - RHC 1/18/21RA mean 9, RV 40/8, PA 39/19, mean 29, PCWP mean 22,Cardiac Output (Fick) 2.9  Cardiac Index (Fick) 1.72,PVR 2.4 WU. 2. Atrial fibrillation: Paroxysmal.  Diagnosed 2/17.  Unable to tolerate amiodarone.  - Holter (9/17): atrial fibrillation with RVR runs, 5% PVCs.  - AV nodal ablation with BiV pacing in 12/18.  - Nausea with ranolazine.  3. Hyperlipidemia 4. PVCs: frequent.  5. Long QT interval 6. Mesenteric dopplers (8/20): normal.   Review of systems complete and found to be negative unless listed in HPI.    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 Occasional ETOH, never heavy.    Current Outpatient  Medications  Medication Sig Dispense Refill  . acetaminophen (TYLENOL) 500 MG tablet Take 500 mg by mouth every 6 (six) hours as needed for headache.    Marland Kitchen amiodarone (PACERONE) 200 MG tablet Take 1 tablet (200 mg total) by mouth daily. 30 tablet 6  . bisoprolol (ZEBETA) 5 MG tablet Take 0.5 tablets (2.5 mg total) by mouth 2 (two) times daily. 90 tablet 3  . digoxin (LANOXIN) 0.125 MG tablet Take 0.5 tablets (0.0625 mg total) by mouth daily. 30 tablet 6  . docusate sodium (COLACE) 100 MG capsule Take 100 mg by mouth daily as needed for mild constipation.    . famotidine (PEPCID) 20 MG tablet Take 1 tablet (20 mg total) by mouth daily. 30 tablet 2  . fluticasone (FLONASE) 50 MCG/ACT nasal spray Place 1 spray into both nostrils daily as needed for allergies or rhinitis.     . furosemide (LASIX) 20 MG tablet Take 1 tablet (20 mg total) by mouth daily. 30 tablet 11  . LORazepam (ATIVAN) 0.5 MG tablet Take 1 tablet (0.5 mg total) by mouth daily as needed (  nausea). 10 tablet 0  . losartan (COZAAR) 25 MG tablet TAKE 1/2 TABLET BY MOUTH AT BEDTIME 15 tablet 6  . Rivaroxaban (XARELTO) 15 MG TABS tablet Take 15 mg by mouth daily with supper.    Marland Kitchen spironolactone (ALDACTONE) 25 MG tablet Take 0.5 tablets (12.5 mg total) by mouth at bedtime. 30 tablet 6  . zolpidem (AMBIEN) 5 MG tablet Take 1 tablet (5 mg total) by mouth at bedtime. 5 tablet 0   No current facility-administered medications for this encounter.    Vitals:   10/09/19 1002  BP: (!) 107/59  Pulse: 74  SpO2: 99%  Weight: 61.8 kg   Wt Readings from Last 3 Encounters:  10/09/19 61.8 kg  09/30/19 61.3 kg  05/28/19 62.8 kg    PHYSICAL EXAM: General:  Well appearing. No resp difficulty HEENT: normal Neck: supple. no JVD. Carotids 2+ bilat; no bruits. No lymphadenopathy or thryomegaly appreciated. Cor: PMI nondisplaced. Regular rate & rhythm. No rubs, gallops or murmurs. Lungs: clear Abdomen: soft, nontender, nondistended. No  hepatosplenomegaly. No bruits or masses. Good bowel sounds. Extremities: no cyanosis, clubbing, rash, edema Neuro: alert & orientedx3, cranial nerves grossly intact. moves all 4 extremities w/o difficulty. Affect pleasant  ASSESSMENT & PLAN:  1. Chronic systolic HF: NICM, LHC 05/6788 showed no significant coronary disease. Cardiac MRI 10/17 EF 36%, diffuse HK, normal RV, biatrial enlargement, moderate MR, non-coronary LGE pattern involving the mid-wall of the basal to mid septum and inferior wall. Possibly prior myocarditis versus a form of infiltrative disease. Echo in 8/20 showed EF 25-30%, severe diffuse HK, normal RV, moderate-severe TR.  Medtronic CRT-D s/p AV nodal ablation.  - She RHC earlier this month and due to low output she was started on digoxin. Asked her to call she gets more symptomatic with increased fatigue or increased shortness of breath.  Not candidate for VAD with age but if needed she may be candidate for home inotropes. We briefly discussed.  - ECHO 09/28/19 EF < 20%  - NYHA II-III. Volume status stable.  Continue Lasix 20 mg daily.  - Continue dogoxin 0.0625 mg daily.   -Continue bisoprolol 2.5 mg bid.  Unable to tolerate increase.   -Continuelosartan 12.5 mg qHS. Unable to tolerate increase.  - Continue spironolactone 12.5 daily.  Unable to tolerate increase.  2. Atrial fibrillation: Chronic, s/p AV nodal ablation and BiV pacing.  - Continue Xarelto 15 mg daily.  3. PVCs: Frequent PVCs have limited BiV pacing.  Not a good PVC ablation candidate per EP. Cannot tolerate amiodarone, sotolol, or Tikosyn. Did not tolerate mexilitine with nausea. Most recently, stopped ranolazine due to nausea. Additionally, did not tolerate further uptitration of bisoprolol.  -Continue amiodarone 200 mg daily. -- Plan to follow amiodarone screening per guidelines  -Check TSH, Free T3 Free T4 at least yearly  - Will need yearly CXR and yearly eye exams.  - Discussed and she is ageeable.    4. Mitral regurgitation: Mild on 8/20 echo. 5. VT : Has ICD. Had shock 09/2019 - Continue amio 200 mg daily.  - No driving 6 months.   Check dig level and BMET.   Follow up in 2 months with Dr Aundra Dubin.   Greater than 50% of the (total minutes 40) visit spent in counseling/coordination of care regarding hospitalizaiton, test results, the above plan.     Darrick Grinder, NP  10/09/2019

## 2019-10-13 ENCOUNTER — Encounter: Payer: Self-pay | Admitting: Internal Medicine

## 2019-10-13 ENCOUNTER — Telehealth (INDEPENDENT_AMBULATORY_CARE_PROVIDER_SITE_OTHER): Payer: PPO | Admitting: Internal Medicine

## 2019-10-13 VITALS — BP 109/71 | HR 85 | Ht 65.0 in | Wt 134.8 lb

## 2019-10-13 DIAGNOSIS — I4821 Permanent atrial fibrillation: Secondary | ICD-10-CM | POA: Diagnosis not present

## 2019-10-13 DIAGNOSIS — I472 Ventricular tachycardia, unspecified: Secondary | ICD-10-CM

## 2019-10-13 DIAGNOSIS — I428 Other cardiomyopathies: Secondary | ICD-10-CM

## 2019-10-13 DIAGNOSIS — I5022 Chronic systolic (congestive) heart failure: Secondary | ICD-10-CM | POA: Diagnosis not present

## 2019-10-13 NOTE — Progress Notes (Signed)
Electrophysiology TeleHealth Note   Due to national recommendations of social distancing due to COVID 19, an audio/video telehealth visit is felt to be most appropriate for this patient at this time.  See MyChart message from today for the patient's consent to telehealth for [facility].  Date:  10/13/2019   ID:  Cheryl Vargas, DOB 02-Apr-1935, MRN [id number]  Location: patient's home  Provider location:  [facility]  Evaluation Performed: Follow-up visit  PCP:  Lavone Orn, MD   Electrophysiologist:  Dr Rayann Heman  Chief Complaint:  palpitations  History of Present Illness:    Cheryl Vargas is a 85 y.o. female who presents via telehealth conferencing today.  Since leaving the hospital, the patient reports doing reasonably well. Her arrhythmia is controlled.  She has had no SOB.  She has had some "anxiety" with symptoms of being sweaty and shaky.  She has had some nausea.  She has been placed on lorazepam.  Episodes occur with exertion.  + constipation.  Today, she denies symptoms of palpitations, chest pain,  lower extremity edema, dizziness, presyncope, or syncope.  The patient is otherwise without complaint today.   Past Medical History:  Diagnosis Date  . Arthritis   . Chronic combined systolic (congestive) and diastolic (congestive) heart failure ([place])   . Mitral regurgitation   . Non-ischemic cardiomyopathy ([place])   . Persistent atrial fibrillation ([place])   . Ventricular tachycardia [facility])     Past Surgical History:  Procedure Laterality Date  . AV NODE ABLATION N/A 09/07/2017   Procedure: AV NODE ABLATION;  Surgeon: [name], MD;  Location: [place] CV LAB;  Service: Cardiovascular;  Laterality: N/A;  . BIV ICD INSERTION CRT-D N/A 09/07/2017   Procedure: BIV ICD INSERTION CRT-D;  Surgeon: [name], MD;  Location: [place] CV LAB;  Service: Cardiovascular;  Laterality: N/A;  . BUNIONECTOMY    . CARDIAC CATHETERIZATION     in 2004 at [facility].  "Insignificant blockage" per patient  . CARDIAC CATHETERIZATION N/A 04/25/2016   Procedure: Left Heart Cath and Coronary Angiography;  Surgeon: Nelva Bush, MD;  Location: [place] CV LAB;  Service: Cardiovascular;  Laterality: N/A;  . CARDIOVERSION N/A 06/29/2017   Procedure: CARDIOVERSION;  Surgeon: Larey Dresser, MD;  Location: [facility] ENDOSCOPY;  Service: Cardiovascular;  Laterality: N/A;  . CARDIOVERSION N/A 08/28/2017   Procedure: CARDIOVERSION;  Surgeon: Larey Dresser, MD;  Location: [facility] ENDOSCOPY;  Service: Cardiovascular;  Laterality: N/A;  . RIGHT HEART CATH N/A 09/29/2019   Procedure: RIGHT HEART CATH;  Surgeon: Larey Dresser, MD;  Location: [place] CV LAB;  Service: Cardiovascular;  Laterality: N/A;  . SHOULDER SURGERY     closed reduction  . TEE WITHOUT CARDIOVERSION N/A 06/29/2017   Procedure: TRANSESOPHAGEAL ECHOCARDIOGRAM (TEE);  Surgeon: Larey Dresser, MD;  Location: [facility] ENDOSCOPY;  Service: Cardiovascular;  Laterality: N/A;  . TONSILLECTOMY    . TOTAL HIP ARTHROPLASTY Left 03/18/2013   Procedure: TOTAL HIP ARTHROPLASTY ANTERIOR APPROACH;  Surgeon: Mauri Pole, MD;  Location: [facility] ORS;  Service: Orthopedics;  Laterality: Left;  . TUBAL LIGATION      Current Outpatient Medications  Medication Sig Dispense Refill  . acetaminophen (TYLENOL) 500 MG tablet Take 500 mg by mouth every 6 (six) hours as needed for headache.    Marland Kitchen amiodarone (PACERONE) 200 MG tablet Take 1 tablet (200 mg total) by mouth daily. 30 tablet 6  . bisoprolol (ZEBETA) 5 MG tablet Take 0.5 tablets (2.5 mg  total) by mouth 2 (two) times daily. 90 tablet 3  . digoxin (LANOXIN) 0.125 MG tablet Take 0.5 tablets (0.0625 mg total) by mouth daily. 30 tablet 6  . docusate sodium (COLACE) 100 MG capsule Take 100 mg by mouth daily as needed for mild constipation.    . fluticasone (FLONASE) 50 MCG/ACT nasal spray Place 1 spray into both nostrils daily as needed for allergies or rhinitis.     . furosemide  (LASIX) 20 MG tablet Take 1 tablet (20 mg total) by mouth daily. 30 tablet 11  . LORazepam (ATIVAN) 0.5 MG tablet Take 1 tablet (0.5 mg total) by mouth daily as needed (nausea). 10 tablet 0  . losartan (COZAAR) 25 MG tablet TAKE 1/2 TABLET BY MOUTH AT BEDTIME 15 tablet 6  . Rivaroxaban (XARELTO) 15 MG TABS tablet Take 15 mg by mouth daily with supper.    Marland Kitchen spironolactone (ALDACTONE) 25 MG tablet Take 0.5 tablets (12.5 mg total) by mouth at bedtime. 30 tablet 6  . zolpidem (AMBIEN) 5 MG tablet Take 1 tablet (5 mg total) by mouth at bedtime. 5 tablet 0  . famotidine (PEPCID) 20 MG tablet Take 1 tablet (20 mg total) by mouth daily. 30 tablet 2   No current facility-administered medications for this visit.    Allergies:   Sotalol, Alendronate sodium, Amiodarone, Ciprofloxacin, Compazine [prochlorperazine], Coreg [carvedilol], and Metoprolol   Social History:  The patient  reports that she has never smoked. She has never used smokeless tobacco. She reports current alcohol use of about 3.0 standard drinks of alcohol per week. She reports that she does not use drugs.   Family History:  The patient's family history includes Heart attack in her father; Lung cancer in her mother.   ROS:  Please see the history of present illness.   All other systems are personally reviewed and negative.    Exam:    Vital Signs:  BP 109/71   Pulse 85   Ht 5\' 5"  (1.651 m)   Wt 134 lb 12.8 oz (61.1 kg)   SpO2 98%   BMI 22.43 kg/m   Well sounding and appearing, alert and conversant, regular work of breathing,  good skin color Eyes- anicteric, neuro- grossly intact, skin- no apparent rash or lesions or cyanosis, mouth- oral mucosa is pink  Labs/Other Tests and Data Reviewed:    Recent Labs: 09/27/2019: ALT 48; B Natriuretic Peptide 1,032.7 09/28/2019: TSH 2.672 09/30/2019: Hemoglobin 15.5; Magnesium 1.9; Platelets 176 10/09/2019: BUN 35; Creatinine, Ser 1.36; Potassium 3.6; Sodium 136   Wt Readings from Last 3  Encounters:  10/13/19 134 lb 12.8 oz (61.1 kg)  10/09/19 136 lb 3.2 oz (61.8 kg)  09/30/19 135 lb 1.6 oz (61.3 kg)     Last device remote is reviewed from [place] PDF which reveals normal device function, no arrhythmias    ASSESSMENT & PLAN:    1.  VT Currently controlled with amiodarone Remotes are uptodate and ICD is functioning well She has some symptoms with amiodarone but is otherwise pleased with results. I did offer that we could reduce amiodarone to 100mg  daily.  At this time, she would prefer to continue 200mg  daily Will need tfts on follow-up with Dr Aundra Dubin. Given advanced age, we should avoid ablation if able.  2. Chronic systolic dysfunction/ nonischemic CM Doing reasonably well Followed in ICM device clinic  3. complete heart block S/p AV nodal ablation and CRT  4. Permanent afib She is on xarelto for chads2vasc score of at least  4.  Follow-up:  With Dr Aundra Dubin as scheduled I will see in 3 months   Patient Risk:  after full review of this patients clinical status, I feel that they are at moderate risk at this time.  Today, I have spent 15 minutes with the patient with telehealth technology discussing arrhythmia management .    Army Fossa, MD  10/13/2019 11:02 AM     [facility] [address] [place]  [address] [phone] (office) [phone] (fax)

## 2019-10-27 ENCOUNTER — Ambulatory Visit (INDEPENDENT_AMBULATORY_CARE_PROVIDER_SITE_OTHER): Payer: PPO

## 2019-10-27 DIAGNOSIS — Z95810 Presence of automatic (implantable) cardiac defibrillator: Secondary | ICD-10-CM

## 2019-10-27 DIAGNOSIS — I5022 Chronic systolic (congestive) heart failure: Secondary | ICD-10-CM

## 2019-10-31 NOTE — Progress Notes (Signed)
EPIC Encounter for ICM Monitoring  Patient Name: Cheryl Vargas is a 85 y.o. female Date: 10/31/2019 Primary Care Physican: Lavone Orn, MD Primary Cardiologist:McLean Electrophysiologist:Allred Bi-V Pacing:97.9% 2/19/2021Weight:135lbs    Spoke with patient and reports feeling well at this time.  Denies fluid symptoms.    OptivolThoracic impedancenormal.  Prescribed: Furosemide20 mg take1tablet by mouth.   Labs: 10/09/2019 Creatinine 1.36, BUN 35, Potassium 3.6, Sodium 136, GFR 36-42 09/30/2019 Creatinine 1.37, BUN 34, Potassium 4.1, Sodium 140, GFR 36-42  09/29/2019 Creatinine 1.44, BUN 30, Potassium 4.2, Sodium 139, GFR 34-39  09/27/2018 Creatinine 1.09, BUN 25, Potassium 3.9, Sodium 138, GFR 47-55  09/26/2018 Creatinine 1.46, BUN 33, Potassium 4.1, Sodium 137, GFR 33-38  05/07/2019 Creatinine1.00, BUN24, Potassium4.6, Sodium138, GFR53->60 A complete set of results can be found in Results Review  Recommendations:  No changes and encouraged to call if experiencing any fluid symptoms.  Follow-up plan: ICM clinic phone appointment on 12/11/2019.   91 day device clinic remote transmission 12/10/2019.  Office appt 09/30/2019 with Dr. Aundra Dubin.    Copy of ICM check sent to Dr. Rayann Heman  3 month ICM trend: 10/27/2019    1 Year ICM trend:       Rosalene Billings, RN 10/31/2019 3:15 PM

## 2019-11-01 ENCOUNTER — Ambulatory Visit: Payer: PPO | Attending: Internal Medicine

## 2019-11-01 DIAGNOSIS — Z23 Encounter for immunization: Secondary | ICD-10-CM | POA: Insufficient documentation

## 2019-11-01 NOTE — Progress Notes (Signed)
   Covid-19 Vaccination Clinic  Name:  Cheryl Vargas    MRN: [id number] DOB: [birth year]  11/01/2019  Ms. [name] was observed post Covid-19 immunization for 15 minutes without incidence. She was provided with Vaccine Information Sheet and instruction to access the V-Safe system.   Ms. [name] was instructed to call 911 with any severe reactions post vaccine: Marland Kitchen Difficulty breathing  . Swelling of your face and throat  . A fast heartbeat  . A bad rash all over your body  . Dizziness and weakness    Immunizations Administered    Name Date Dose VIS Date Route   Pfizer COVID-19 Vaccine 11/01/2019  8:19 AM 0.3 mL 08/22/2019 Intramuscular   Manufacturer: [place]   Lot: [id number]   [place]: [id number]

## 2019-11-25 ENCOUNTER — Ambulatory Visit: Payer: PPO | Attending: Internal Medicine

## 2019-11-25 DIAGNOSIS — Z23 Encounter for immunization: Secondary | ICD-10-CM

## 2019-11-25 NOTE — Progress Notes (Signed)
   Covid-19 Vaccination Clinic  Name:  Cheryl Vargas    MRN: [id number] DOB: [birth year]  11/25/2019  [name] was observed post Covid-19 immunization for 15 minutes without incident. She was provided with Vaccine Information Sheet and instruction to access the V-Safe system.   [name] was instructed to call 911 with any severe reactions post vaccine: Marland Kitchen Difficulty breathing  . Swelling of face and throat  . A fast heartbeat  . A bad rash all over body  . Dizziness and weakness   Immunizations Administered    Name Date Dose VIS Date Route   Pfizer COVID-19 Vaccine 11/25/2019  9:18 AM 0.3 mL 08/22/2019 Intramuscular   Manufacturer: [place]   Lot: [id number]   [place]: [id number]

## 2019-12-10 ENCOUNTER — Ambulatory Visit (INDEPENDENT_AMBULATORY_CARE_PROVIDER_SITE_OTHER): Payer: PPO | Admitting: *Deleted

## 2019-12-10 DIAGNOSIS — I428 Other cardiomyopathies: Secondary | ICD-10-CM | POA: Diagnosis not present

## 2019-12-10 LAB — CUP PACEART REMOTE DEVICE CHECK
Battery Remaining Longevity: 57 mo
Battery Voltage: 2.97 V
Brady Statistic AP VP Percent: 0 %
Brady Statistic AP VS Percent: 0 %
Brady Statistic AS VP Percent: 0 %
Brady Statistic AS VS Percent: 0 %
Brady Statistic RA Percent Paced: 0 %
Brady Statistic RV Percent Paced: 98.4 %
Date Time Interrogation Session: 20210331001703
HighPow Impedance: 59 Ohm
Implantable Lead Implant Date: 20181228
Implantable Lead Implant Date: 20181228
Implantable Lead Implant Date: 20181228
Implantable Lead Location: 753858
Implantable Lead Location: 753859
Implantable Lead Location: 753860
Implantable Lead Model: 4598
Implantable Lead Model: 5076
Implantable Pulse Generator Implant Date: 20181228
Lead Channel Impedance Value: 162.857
Lead Channel Impedance Value: 162.857
Lead Channel Impedance Value: 166.25 Ohm
Lead Channel Impedance Value: 194.634
Lead Channel Impedance Value: 194.634
Lead Channel Impedance Value: 285 Ohm
Lead Channel Impedance Value: 285 Ohm
Lead Channel Impedance Value: 380 Ohm
Lead Channel Impedance Value: 380 Ohm
Lead Channel Impedance Value: 380 Ohm
Lead Channel Impedance Value: 399 Ohm
Lead Channel Impedance Value: 4047 Ohm
Lead Channel Impedance Value: 437 Ohm
Lead Channel Impedance Value: 551 Ohm
Lead Channel Impedance Value: 551 Ohm
Lead Channel Impedance Value: 608 Ohm
Lead Channel Impedance Value: 646 Ohm
Lead Channel Impedance Value: 665 Ohm
Lead Channel Pacing Threshold Amplitude: 0.375 V
Lead Channel Pacing Threshold Amplitude: 0.625 V
Lead Channel Pacing Threshold Pulse Width: 0.4 ms
Lead Channel Pacing Threshold Pulse Width: 0.4 ms
Lead Channel Sensing Intrinsic Amplitude: 14 mV
Lead Channel Sensing Intrinsic Amplitude: 14 mV
Lead Channel Setting Pacing Amplitude: 1 V
Lead Channel Setting Pacing Amplitude: 2.5 V
Lead Channel Setting Pacing Pulse Width: 0.4 ms
Lead Channel Setting Pacing Pulse Width: 0.4 ms
Lead Channel Setting Sensing Sensitivity: 0.3 mV

## 2019-12-10 NOTE — Progress Notes (Signed)
ICD Remote  

## 2019-12-11 ENCOUNTER — Other Ambulatory Visit (HOSPITAL_COMMUNITY): Payer: Self-pay | Admitting: *Deleted

## 2019-12-11 MED ORDER — RIVAROXABAN 15 MG PO TABS: 15.0000 mg | ORAL_TABLET | Freq: Every day | ORAL | 11 refills | Status: DC

## 2019-12-12 ENCOUNTER — Ambulatory Visit (INDEPENDENT_AMBULATORY_CARE_PROVIDER_SITE_OTHER): Payer: PPO

## 2019-12-12 DIAGNOSIS — Z95810 Presence of automatic (implantable) cardiac defibrillator: Secondary | ICD-10-CM | POA: Diagnosis not present

## 2019-12-12 DIAGNOSIS — I5022 Chronic systolic (congestive) heart failure: Secondary | ICD-10-CM

## 2019-12-15 ENCOUNTER — Telehealth: Payer: Self-pay

## 2019-12-15 NOTE — Telephone Encounter (Signed)
Was advised by LS (ICM clinic) that Pt STOPPED taking all of the following medications on December 01, 2019:  Digoxin, Amiodarone, and Bisoprolol.  She decided to change her lasix to PRN on November 28, 2019 (Pt made all these changes by herself, NOT advised by her physicians.)  Pt has follow up scheduled with Dr. Aundra Dubin on December 23, 2019.  Pt has follow up scheduled with Dr. Rayann Heman for Jan 12, 2020.  Pt also told LS that she did not want to follow with Dr. Aundra Dubin anymore because they do not call her back.  Wants to only follow with Dr. Rayann Heman for cardiology.  Will forward to Dr. Rayann Heman and Dr. Aundra Dubin as an Juluis Rainier.

## 2019-12-15 NOTE — Progress Notes (Signed)
EPIC Encounter for ICM Monitoring  Patient Name: Cheryl Vargas is a 85 y.o. female Date: 12/15/2019 Primary Care Physican: Lavone Orn, MD Primary Cardiologist:McLean Electrophysiologist:Allred Bi-V Pacing:98.4% 4/5/2021Weight:134.4lbs    Spoke with patient and reports feeling well at this time.  Denies fluid symptoms.    Patient has stopped the following medications without physician approval:  Lasix - taking PRN in stead of daily   Digoxin - stopped on 3/22  Amiodarone - stopped on 3/22  Bisoprolol - stopped on 3/22  OptivolThoracic impedancesuggesting possible fluid accumulation since 11/27/19.  Patient asked if Dr Rayann Heman could be her cardiologist.  She stated she has difficulty accessing the heart failure clinic when needed. Advised to discuss her concerns with Dr Aundra Dubin on 4/13 regarding the communication between her and the HF clinic.   Prescribed: Furosemide20 mg take1tablet by mouth.   Labs: 10/09/2019 Creatinine 1.36, BUN 35, Potassium 3.6, Sodium 136, GFR 36-42 09/30/2019 Creatinine 1.37, BUN 34, Potassium 4.1, Sodium 140, GFR 36-42  09/29/2019 Creatinine 1.44, BUN 30, Potassium 4.2, Sodium 139, GFR 34-39  09/27/2018 Creatinine 1.09, BUN 25, Potassium 3.9, Sodium 138, GFR 47-55  09/26/2018 Creatinine 1.46, BUN 33, Potassium 4.1, Sodium 137, GFR 33-38  05/07/2019 Creatinine1.00, BUN24, Potassium4.6, Sodium138, GFR53->60 A complete set of results can be found in Results Review  Recommendations: Advised to take Lasix as prescribed.  Also advised patient of the dangers of abruptly stopping medications without physician approval which can lead to hospitalization and possible death.  She said she understands and closely monitors for any symptoms but she feels so much better without taking all those meds.   Message sent to Dr Jackalyn Lombard nurse, Willeen Cass, RN informing her that patient  stopped the above medications.  Jennifer sent phone note to Dr Rayann Heman and Dr Aundra Dubin to notify them patient stopped meds.   Follow-up plan: ICM clinic phone appointment on4/13/2021 (manual send) to recheck fluid levels. 91 day device clinic remote transmission 03/10/2020.   Office appt 12/23/2019 with Dr.McLean and 01/12/2020 with Dr Rayann Heman.   Copy of ICM check sent to Dr.Allred.  3 month ICM trend: 12/15/2019    1 Year ICM trend:       Rosalene Billings, RN 12/15/2019 1:52 PM

## 2019-12-15 NOTE — Telephone Encounter (Signed)
Spoke with patient and she states that she contacted the pharmacy on multiple occasions to request a refill on her Ativan. She states that the pharmacy stated that they sent over a  refill request;we did receive it and we advised the pharmacy that the refill should be filled by her pcp. Patient did state she is still coming to her appointment on 12/23/19

## 2019-12-15 NOTE — Telephone Encounter (Signed)
Could someone check with Cheryl Vargas and see what her communication concerns are with HF clinic? Thanks.

## 2019-12-17 DIAGNOSIS — H25013 Cortical age-related cataract, bilateral: Secondary | ICD-10-CM | POA: Diagnosis not present

## 2019-12-17 DIAGNOSIS — H52203 Unspecified astigmatism, bilateral: Secondary | ICD-10-CM | POA: Diagnosis not present

## 2019-12-17 DIAGNOSIS — H35371 Puckering of macula, right eye: Secondary | ICD-10-CM | POA: Diagnosis not present

## 2019-12-17 DIAGNOSIS — H04123 Dry eye syndrome of bilateral lacrimal glands: Secondary | ICD-10-CM | POA: Diagnosis not present

## 2019-12-23 ENCOUNTER — Encounter (HOSPITAL_COMMUNITY): Payer: Self-pay | Admitting: Cardiology

## 2019-12-23 ENCOUNTER — Other Ambulatory Visit: Payer: Self-pay

## 2019-12-23 ENCOUNTER — Ambulatory Visit (INDEPENDENT_AMBULATORY_CARE_PROVIDER_SITE_OTHER): Payer: PPO

## 2019-12-23 ENCOUNTER — Ambulatory Visit (HOSPITAL_COMMUNITY)
Admission: RE | Admit: 2019-12-23 | Discharge: 2019-12-23 | Disposition: A | Discharge status: Home / Self Care | Payer: PPO | Source: Ambulatory Visit | Attending: Cardiology | Admitting: Cardiology

## 2019-12-23 VITALS — BP 120/76 | HR 88 | Wt 135.2 lb

## 2019-12-23 DIAGNOSIS — R54 Age-related physical debility: Secondary | ICD-10-CM | POA: Insufficient documentation

## 2019-12-23 DIAGNOSIS — I5022 Chronic systolic (congestive) heart failure: Secondary | ICD-10-CM

## 2019-12-23 DIAGNOSIS — I493 Ventricular premature depolarization: Secondary | ICD-10-CM | POA: Insufficient documentation

## 2019-12-23 DIAGNOSIS — I428 Other cardiomyopathies: Secondary | ICD-10-CM

## 2019-12-23 DIAGNOSIS — I482 Chronic atrial fibrillation, unspecified: Secondary | ICD-10-CM | POA: Diagnosis not present

## 2019-12-23 DIAGNOSIS — E785 Hyperlipidemia, unspecified: Secondary | ICD-10-CM | POA: Diagnosis not present

## 2019-12-23 DIAGNOSIS — Z7901 Long term (current) use of anticoagulants: Secondary | ICD-10-CM | POA: Diagnosis not present

## 2019-12-23 DIAGNOSIS — Z79899 Other long term (current) drug therapy: Secondary | ICD-10-CM | POA: Diagnosis not present

## 2019-12-23 DIAGNOSIS — I110 Hypertensive heart disease with heart failure: Secondary | ICD-10-CM | POA: Diagnosis not present

## 2019-12-23 DIAGNOSIS — Z95810 Presence of automatic (implantable) cardiac defibrillator: Secondary | ICD-10-CM

## 2019-12-23 DIAGNOSIS — I4821 Permanent atrial fibrillation: Secondary | ICD-10-CM | POA: Diagnosis not present

## 2019-12-23 DIAGNOSIS — R0609 Other forms of dyspnea: Secondary | ICD-10-CM | POA: Insufficient documentation

## 2019-12-23 MED ORDER — DIGOXIN 125 MCG PO TABS: 0.0625 mg | ORAL_TABLET | Freq: Every day | ORAL | 3 refills | Status: DC

## 2019-12-23 MED ORDER — FUROSEMIDE 20 MG PO TABS: 20.0000 mg | ORAL_TABLET | ORAL | 3 refills | Status: DC

## 2019-12-23 MED ORDER — FUROSEMIDE 20 MG PO TABS: 20.0000 mg | ORAL_TABLET | ORAL | 4 refills | Status: DC

## 2019-12-23 NOTE — Patient Instructions (Signed)
START Digoxin 0.625mg  (1/2 tab) daily  TAKE Lasix 20mg  daily for 3 days THEN every other day  Labs in 1 week We will only contact you if something comes back abnormal or we need to make some changes. Otherwise no news is good news!  Your physician recommends that you schedule a follow-up appointment in: 1 week for labs and 2 months with Dr Aundra Dubin.   Please call office at [phone] option 2 if you have any questions or concerns.   At the [facility], you and your health needs are our priority. As part of our continuing mission to provide you with exceptional heart care, we have created designated Provider Care Teams. These Care Teams include your primary Cardiologist (physician) and Advanced Practice Providers (APPs- Physician Assistants and Nurse Practitioners) who all work together to provide you with the care you need, when you need it.   You may see any of the following providers on your designated Care Team at your next follow up: Marland Kitchen Dr Glori Bickers . Dr [name] . Darrick Grinder, NP . Lyda Jester, PA . Audry Riles, PharmD   Please be sure to bring in all your medications bottles to every appointment.

## 2019-12-23 NOTE — Progress Notes (Signed)
Advanced Heart Failure Clinic Note   Primary Care: Dr Laurann Montana HF Cardiology: Dr. Aundra Dubin  HPI: [name] is an 85 y.o. female, retired Marine scientist, with a past medical history of HTN, HLD, PAF (On Xarelto), and nonischemic cardiomyopathy with chronic systolic CHF. Cardiomyopathy dates back to at least 2014 based on echoes (EF 30-35% in 2014).   Admitted 8/17 for fatigue and dyspnea.  She was seen by electrophysiology on 04/17/16, and was started on po Amiodarone as she reported increased palpitations and fatigue with frequent PVCs. Her BNP was elevated at 1592.6 and chest x ray was consistent with CHF.  She was diuresed.  With her complaints of new onset fatigue and dyspnea combined with her known LV dysfunction, it was felt that she would benefit from an ischemic evaluation by cath. She underwent left heart cath with normal cors. Amiodarone was later stopped due to nausea.  Discharge weight 143 pounds. Echo (8/17) with EF 40-45%, inferoseptal akinesis. Cardiac MRI 10/17 with EF 36%, LGE mid wall pattern in the basal to mid septum and inferior wall. Holter monitor in 9/17 with runs of atrial fibrillation/RVR.  She has had trouble tolerating cardiac meds due to hypotension/lightheadedness.    She has had difficulty with symptomatic atrial fibrillation. She developed symptomatic atrial fibrillation with RVR again in 10/18, difficult to control rate.  She had TEE-guided DCCV with resumption of NSR. On TEE in rapid atrial fibrillation, EF 20-25%.  On TTE after DCCV, EF back to 40-45% range.  MR reported as moderate to severe but I reviewed echo and think it appears more in the moderate range.    She had recurrent atrial fibrillation after 10/18 DCCV. She was admitted in 12/18 with atrial fibrillation and RVR, she also had a run of WCT thought to be VT.   The atrial fibrillation was very difficult to control.  She was thought to be a poor candidate for atrial fibrillation ablation.  She finally had AV nodal  ablation with Medtronic CRT-D device in 12/18. She was then admitted briefly with CHF in 1/19.  Echo in 12/18 showed EF 25-30%, moderate MR, moderate TR, severe LAE.  Echo in 4/19 showed EF 25-30%, diffuse hypokinesis, mild MR, moderately decreased RV systolic function.   Admitted 4/7 - 12/21/17 with A/C CHF. Found to have decreased BiV pacing in setting of frequent PVCs. Tried on Mexiletine but failed due to nausea. Switched to Ranexa. Diuresed with IV lasix.   In 5/19, she was admitted with a near-syncopal episode that occurred while straining for a bowel movement.  Suspected vagal event.   She has stopped ranolazine due to nausea.  She did not tolerate increase in bisoprolol.   She was admitted in 8/20 with presyncope; she would get abdominal pain followed by a feeling of warmth and diaphoresis, then she would get dizzy.  No syncope. No explanatory arrhythmias were seen on device interrogation or telemetry in the hospital.  She was not orthostatic or dry.  Thought to be possibly a vagal response to GI trigger (dyspepsia).  Mesenteric dopplers in 8/20 were normal. She feels like Pepcid has helped.   She was admitted in 1/21 with VT and ICD discharge.  RHC showed low output with CI 1.72.  Echo showed EF < 20%, moderate LV dilation, mild RV dilation with decreased systolic function, mild-moderate MR.  She was started on amiodarone and digoxin.   She returns for followup of atrial fibrillation and CHF today.  She has quit taking amiodarone due to nausea (had  this with amiodarone in the past as well).  She stopped taking digoxin and Lasix also.  Weight is stable.  She has noted increased exertional dyspnea, she has a hard time walking across stores.  No dyspnea walking around her house.  No lightheadedness.  No chest pain.  Nausea resolved off amiodarone.   Medtronic device interrogation: No VT, 98% BiV paced, fluid index > threshold with decreased thoracic impedance (thoracic impedance began to drop when  she stopped Lasix).   ECG (personally reviewed): atrial fibrillation, V-paced, QTc 542 msec  Labs (8/17): K 4.2, creatinine 0.85, BNP 1593, HCT 43.2, TSH mild elevated 5.3, free T4 normal Labs (9/17): K 4.1, creatinine 0.98 => 0.92, BNP 258, SPEP negative Labs (2/18): K 4, creatinine 0.83, HCT 40.8 Labs (10/18): K 4.2, creatinine 0.94, hgb 14.9 Labs (1/19): K 4.9, creatinine 0.72 Labs (2/19): K 4.1, creatinine 0.97 Labs (5/19): K 3.4, creatinine 1.17 Labs (6/19): K 3.9, creatinine 1.2 Labs (8/19): K 3.7, creatinine 1.14 Labs (1/20): K 3.9, creatinine 1.25 Labs (8/20): K 4.1, creatinine 1.0, hgb 14.4 Labs (1/21): digoxin <0.2, K 3.6, creatinine 1.36  1. Chronic systolic CHF:  Nonischemic cardiomyopathy.   - Echo (2014): EF 30-35%. - Echo (2/17): EF 40-45% - Echo (8/17): EF 40-45%, mid to apical inferoseptal akinesis - Coronary angiography (8/17): No significant CAD.  - Cardiac MRI (10/17): EF 36%, moderate MR, normal RV size and systolic function, mid-wall LGE in the basal to mid septum and inferior wall.  - TEE (10/18): EF 20-25% but in rapid afib with HR up to 150 bpm, mild MR.  - Echo (10/18): EF 40-45%, diffuse hypokinesis, moderate LAE, reportedly moderate-severe MR (looks moderate on my review).  - Echo (12/18): EF 25-30%, moderate MR, moderate TR, severe LAE - Medtronic CRT-D device s/p AV nodal ablation.  - Echo (4/19): EF 25-30%, diffuse hypokinesis, mild MR, moderately decreased RV systolic function.  - Echo (8/20): EF 25-30%, normal RV size and systolic function, mild MR, moderate-severe TR.  - RHC (1/21): mean RA 9, PA 39/19, mean PCWP 22, CI 1.72, PVR 2.4 WU - Echo (1/21): EF <20%, moderate LV dilation, mild RV dilation with mildly decreased systolic function, mild-moderate MR.  2. Atrial fibrillation: Paroxysmal.  Diagnosed 2/17.  Unable to tolerate amiodarone.  - Holter (9/17): atrial fibrillation with RVR runs, 5% PVCs.  - AV nodal ablation with BiV pacing in 12/18.    - Nausea with ranolazine.  3. Hyperlipidemia 4. PVCs: frequent.  5. Long QT interval 6. Mesenteric dopplers (8/20): normal.  7. VT in 1/21 with ICD discharge.   Review of systems complete and found to be negative unless listed in HPI.    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 Occasional ETOH, never heavy.    Current Outpatient Medications  Medication Sig Dispense Refill  . acetaminophen (TYLENOL) 500 MG tablet Take 500 mg by mouth every 6 (six) hours as needed for headache.    . bisoprolol (ZEBETA) 5 MG tablet Take 0.5 tablets (2.5 mg total) by mouth 2 (two) times daily. 90 tablet 3  . famotidine (PEPCID) 20 MG tablet Take 1 tablet (20 mg total) by mouth daily. 30 tablet 2  . fluticasone (FLONASE) 50 MCG/ACT nasal spray Place 1 spray into both nostrils daily as needed for allergies or rhinitis.     . furosemide (LASIX) 20 MG tablet Take 1 tablet (20 mg total) by mouth every other day. 45 tablet  3  . LORazepam (ATIVAN) 0.5 MG tablet Take 1 tablet (0.5 mg total) by mouth daily as needed (nausea). 10 tablet 0  . losartan (COZAAR) 25 MG tablet TAKE 1/2 TABLET BY MOUTH AT BEDTIME 15 tablet 6  . Rivaroxaban (XARELTO) 15 MG TABS tablet Take 1 tablet (15 mg total) by mouth daily with supper. 30 tablet 11  . spironolactone (ALDACTONE) 25 MG tablet Take 0.5 tablets (12.5 mg total) by mouth at bedtime. 30 tablet 6  . VITAMIN D PO Take by mouth.    . zolpidem (AMBIEN) 5 MG tablet Take 1 tablet (5 mg total) by mouth at bedtime. 5 tablet 0  . digoxin (LANOXIN) 0.125 MG tablet Take 0.5 tablets (0.0625 mg total) by mouth daily. 45 tablet 3   No current facility-administered medications for this encounter.    Vitals:   12/23/19 1125  BP: 120/76  Pulse: 88  SpO2: 99%  Weight: 61.3 kg (135 lb 3.2 oz)   Wt Readings from Last 3 Encounters:  12/23/19 61.3 kg (135 lb 3.2 oz)  10/13/19 61.1 kg (134 lb 12.8 oz)  10/09/19 61.8 kg  (136 lb 3.2 oz)    PHYSICAL EXAM: General: NAD Neck: JVP 8 cm, no thyromegaly or thyroid nodule.  Lungs: Clear to auscultation bilaterally with normal respiratory effort. CV: Nondisplaced PMI.  Heart regular S1/S2, no S3/S4, no murmur.  No peripheral edema.  No carotid bruit.  Normal pedal pulses.  Abdomen: Soft, nontender, no hepatosplenomegaly, no distention.  Skin: Intact without lesions or rashes.  Neurologic: Alert and oriented x 3.  Psych: Normal affect. Extremities: No clubbing or cyanosis.  HEENT: Normal.    ASSESSMENT & PLAN:  1. Chronic systolic HF: NICM, LHC 02/5783 showed no significant coronary disease. Cardiac MRI 10/17 EF 36%, diffuse HK, normal RV, biatrial enlargement, moderate MR, non-coronary LGE pattern involving the mid-wall of the basal to mid septum and inferior wall. Possibly prior myocarditis versus a form of infiltrative disease. Medtronic CRT-D s/p AV nodal ablation.  Echo in 1/21 showed EF < 20% with moderate LV dilation and mildly decreased RV systolic function.  Low output on 1/21 RHC (CI 1.72).  NYHA class III symptoms.  She is volume overloaded on exam and by Optivol.  Based on Optivol evaluation, volume overload started to develop when she stopped Lasix.  - Restart Lasix, she will take 20 mg for 3 days in a row then 20 mg every other day. BMET 1 week.  -Continue bisoprolol 2.5 mg daily.  Unable to tolerate increase.   -Continuelosartan 12.5 mg qHS. Unable to tolerate increase.  - Continue spironolactone 12.5 daily.  Unable to tolerate increase.  - Restart digoxin 0.125 daily. Check level in 1 week.  - Low output on 1/21 RHC.  Given her age and frailty, think that LVAD would be difficult for her. Will try to continue medical management.  2. Atrial fibrillation: Chronic, s/p AV nodal ablation and BiV pacing.  - Continue Xarelto 15 mg daily.  3. PVCs/VT: Frequent PVCs have limited BiV pacing.  Not a good PVC ablation candidate per EP. Had VT in 1/21.  Cannot  tolerate amiodarone, sotolol, or Tikosyn. Did not tolerate mexilitine with nausea. Had nausea also with ranolazine. Additionally, did not tolerate further uptitration of bisoprolol.  - I am not sure we have a lot of further options here to suppress her PVCs or prevent VT.  - No driving until 6/96 with VT/presyncope in 1/21.  4. Mitral regurgitation: Mild-moderate on 1/21 echo.  Followup in 2 months   [name], MD  12/23/2019

## 2019-12-23 NOTE — Progress Notes (Signed)
Medication Samples have been provided to the patient.  Drug name: Xarelto       Strength: 15mg         Qty: 28  LOT: [id number]  Exp.Date: 8/21   Dosing instructions: 1 tab with supper daily  The patient has been instructed regarding the correct time, dose, and frequency of taking this medication, including desired effects and most common side effects.   Linus Orn Birdie Fetty 12:09 PM 12/23/2019

## 2019-12-24 NOTE — Progress Notes (Addendum)
EPIC Encounter for ICM Monitoring  Patient Name: Cheryl Vargas is a 85 y.o. female Date: 12/24/2019 Primary Care Physican: Lavone Orn, MD Primary Cardiologist:[place] Electrophysiologist:Allred Bi-V Pacing:98.3% 4/5/2021Weight:134.4lbs    Patient seen in office visit by Dr Aundra Dubin on 4/13.  OptivolThoracic impedancecontinues to suggest possible fluid accumulation which correlates with when patient decided to stop lasix without physician approval.  Prescribed: Furosemide20 mg take1tablet by mouth every other day.   Labs: 10/09/2019 Creatinine1.36, Lonie Peak [id number], [id number] 09/30/2019 Creatinine1.37, BUN34, Potassium4.1, [id number], [id number]  09/29/2019 Creatinine1.44, BUN30, Potassium4.2, [id number], [id number]  A complete set of results can be found in Results Review  Recommendations: Dr Claris Gladden OV recommendations were to restart Lasix, she will take 20 mg for 3 days in a row then 20 mg every other day.  Follow-up plan: ICM clinic phone appointment on4/19/2021 (manual send) to recheck fluid levels.91 day device clinic remote transmission 03/10/2020. Office appt 01/12/2020 with Dr Rayann Heman.   Copy of ICM check sent to Dr.Allred.  3 month ICM trend: 12/23/2019   1 Year ICM trend:       Rosalene Billings, RN 12/24/2019 11:09 AM

## 2019-12-31 ENCOUNTER — Other Ambulatory Visit: Payer: Self-pay

## 2019-12-31 ENCOUNTER — Ambulatory Visit (HOSPITAL_COMMUNITY)
Admission: RE | Admit: 2019-12-31 | Discharge: 2019-12-31 | Disposition: A | Discharge status: Home / Self Care | Payer: PPO | Source: Ambulatory Visit | Attending: Internal Medicine | Admitting: Internal Medicine

## 2019-12-31 DIAGNOSIS — I428 Other cardiomyopathies: Secondary | ICD-10-CM | POA: Diagnosis not present

## 2019-12-31 NOTE — Progress Notes (Signed)
No ICM remote transmission received for 12/29/2019 and next ICM transmission scheduled for 01/12/2020.

## 2020-01-07 ENCOUNTER — Encounter (HOSPITAL_COMMUNITY): Payer: Self-pay

## 2020-01-10 ENCOUNTER — Ambulatory Visit (INDEPENDENT_AMBULATORY_CARE_PROVIDER_SITE_OTHER)
Admission: RE | Admit: 2020-01-10 | Discharge: 2020-01-10 | Disposition: A | Discharge status: Home / Self Care | Payer: PPO | Source: Ambulatory Visit

## 2020-01-10 DIAGNOSIS — R21 Rash and other nonspecific skin eruption: Secondary | ICD-10-CM

## 2020-01-10 MED ORDER — VALACYCLOVIR HCL 1 G PO TABS: 1000.0000 mg | ORAL_TABLET | Freq: Three times a day (TID) | ORAL | 0 refills | Status: AC

## 2020-01-10 MED ORDER — TRAMADOL HCL 50 MG PO TABS: 50.0000 mg | ORAL_TABLET | Freq: Four times a day (QID) | ORAL | 0 refills | Status: DC | PRN

## 2020-01-10 NOTE — ED Provider Notes (Signed)
Virtual Visit via Video Note:  Cheryl Vargas  initiated request for Telemedicine visit with [facility]gent [facility] team. I connected with Cheryl Vargas  on 01/10/2020 at 4:33 PM  for a synchronized telemedicine visit using a video enabled HIPPA compliant telemedicine application. I verified that I am speaking with Cheryl Vargas  using two identifiers. Hallie C Wieters, PA-C  was physically located in a [facility] Urgent care site and Miesha Bachmann Morency was located at a different location.   The limitations of evaluation and management by telemedicine as well as the availability of in-person appointments were discussed. Patient was informed that she  may incur a bill ( including co-pay) for this virtual visit encounter. Cheryl Vargas  expressed understanding and gave verbal consent to proceed with virtual visit.     History of Present Illness:Cheryl Vargas  is a 85 y.o. female presents for evaluation of pain and a rash.  Patient notes that over the past couple days she has had a lot of pain that she describes as neuralgia on the inner aspect of her left leg.  She reports that she has had shooting pains.  Since it has become painful to her whole left side.  Today she noticed she began to develop a rash in this area, describes as blisters that are red, also notes that there is some areas with fluid-filled, possible pus.  Denies any lesions on right side.  Mainly localized to this area of pain.  Is on Xarelto.    Allergies  Allergen Reactions  . Sotalol Other (See Comments)    Prolonged QTc  . Alendronate Sodium Other (See Comments)    Arm pain  . Amiodarone Nausea Only  . Ciprofloxacin Other (See Comments)    Not effective  . Compazine [Prochlorperazine]   . Coreg [Carvedilol] Other (See Comments)    Fatigue   . Metoprolol Other (See Comments)    Profound fatigue     Past Medical History:  Diagnosis Date  . Arthritis   . Chronic combined systolic (congestive) and diastolic  (congestive) heart failure ([place])   . Mitral regurgitation   . Non-ischemic cardiomyopathy ([place])   . Persistent atrial fibrillation ([place])   . Ventricular tachycardia ([facility])      Social History   Tobacco Use  . Smoking status: Never Smoker  . Smokeless tobacco: Never Used  Substance Use Topics  . Alcohol use: Yes    Alcohol/week: 3.0 standard drinks    Types: 3 Glasses of wine per week    Comment: per week  . Drug use: No        Observations/Objective: Physical Exam  Constitutional: She is oriented to person, place, and time and well-developed, well-nourished, and in no distress. No distress.  HENT:  Head: Normocephalic and atraumatic.  Pulmonary/Chest: Effort normal. No respiratory distress.  Speaking in full sentences  Neurological: She is alert and oriented to person, place, and time.  Speech clear, face symmetric  Skin:  Skin exam limited due to visual quality of video, but left proximal upper leg appears to have erythematous distribution of a rash, vesicular lesions noted in some areas, other areas do appear more purple/ecchymotic/purpura     Assessment and Plan:  Rash and nonspecific skin eruption  History highly suspicious of shingles given preceding pain and symptoms unilateral.  Empirically treating for this with Valtrex, provided tramadol for pain as she is on Xarelto and cannot use NSAIDs.  Discussed with patient concern around deep  purple discoloration associated with this as this seems more atypical for shingles.  Advised if rash spreading or worsening, spreading to right side to follow-up in person for better evaluation of this rash.    Follow Up Instructions:     I discussed the assessment and treatment plan with the patient. The patient was provided an opportunity to ask questions and all were answered. The patient agreed with the plan and demonstrated an understanding of the instructions.   The patient was advised to call back or seek an in-person  evaluation if the symptoms worsen or if the condition fails to improve as anticipated.      Janith Lima, PA-C  01/10/2020 4:33 PM         Janith Lima, PA-C 01/11/20 [phone]

## 2020-01-12 ENCOUNTER — Ambulatory Visit (INDEPENDENT_AMBULATORY_CARE_PROVIDER_SITE_OTHER): Payer: PPO

## 2020-01-12 ENCOUNTER — Encounter: Payer: Self-pay | Admitting: Internal Medicine

## 2020-01-12 ENCOUNTER — Telehealth (INDEPENDENT_AMBULATORY_CARE_PROVIDER_SITE_OTHER): Payer: PPO | Admitting: Internal Medicine

## 2020-01-12 ENCOUNTER — Other Ambulatory Visit: Payer: Self-pay

## 2020-01-12 VITALS — BP 97/65 | HR 81 | Ht 65.0 in | Wt 131.1 lb

## 2020-01-12 DIAGNOSIS — I428 Other cardiomyopathies: Secondary | ICD-10-CM | POA: Diagnosis not present

## 2020-01-12 DIAGNOSIS — I472 Ventricular tachycardia, unspecified: Secondary | ICD-10-CM

## 2020-01-12 DIAGNOSIS — I442 Atrioventricular block, complete: Secondary | ICD-10-CM | POA: Diagnosis not present

## 2020-01-12 DIAGNOSIS — I5022 Chronic systolic (congestive) heart failure: Secondary | ICD-10-CM

## 2020-01-12 DIAGNOSIS — Z95810 Presence of automatic (implantable) cardiac defibrillator: Secondary | ICD-10-CM

## 2020-01-12 NOTE — Progress Notes (Signed)
Electrophysiology TeleHealth Note   Due to national recommendations of social distancing due to [place] 19, an audio telehealth visit is felt to be most appropriate for this patient at this time.  See MyChart message from today for the patient's consent to telehealth for [facility]  Date:  01/12/2020   ID:  Cheryl Vargas, DOB 1934-10-12, MRN [id number]  Location: patient's home  Provider location:  [place]  Evaluation Performed: Follow-up visit  PCP:  Lavone Orn, MD   Electrophysiologist:  Dr Rayann Heman  Chief Complaint:  palpitations  History of Present Illness:    Cheryl Vargas is a 85 y.o. female who presents via telehealth conferencing today.  Since last being seen in our clinic, the patient reports doing very well since stopping amiodarone.  She has developed singles and feels "lousy" with this.  Today, she denies symptoms of palpitations, chest pain, shortness of breath,  lower extremity edema, dizziness, presyncope, or syncope.  The patient is otherwise without complaint today.   Past Medical History:  Diagnosis Date  . Arthritis   . Chronic combined systolic (congestive) and diastolic (congestive) heart failure ([place])   . Mitral regurgitation   . Non-ischemic cardiomyopathy ([place])   . Persistent atrial fibrillation ([place])   . Ventricular tachycardia [facility])     Past Surgical History:  Procedure Laterality Date  . AV NODE ABLATION N/A 09/07/2017   Procedure: AV NODE ABLATION;  Surgeon: [name], MD;  Location: [place] CV LAB;  Service: Cardiovascular;  Laterality: N/A;  . BIV ICD INSERTION CRT-D N/A 09/07/2017   Procedure: BIV ICD INSERTION CRT-D;  Surgeon: [name], MD;  Location: [place] CV LAB;  Service: Cardiovascular;  Laterality: N/A;  . BUNIONECTOMY    . CARDIAC CATHETERIZATION     in 2004 at [facility]. "Insignificant blockage" per patient  . CARDIAC CATHETERIZATION N/A 04/25/2016   Procedure: Left Heart Cath and Coronary  Angiography;  Surgeon: Nelva Bush, MD;  Location: [place] CV LAB;  Service: Cardiovascular;  Laterality: N/A;  . CARDIOVERSION N/A 06/29/2017   Procedure: CARDIOVERSION;  Surgeon: Larey Dresser, MD;  Location: [facility] ENDOSCOPY;  Service: Cardiovascular;  Laterality: N/A;  . CARDIOVERSION N/A 08/28/2017   Procedure: CARDIOVERSION;  Surgeon: Larey Dresser, MD;  Location: [facility] ENDOSCOPY;  Service: Cardiovascular;  Laterality: N/A;  . RIGHT HEART CATH N/A 09/29/2019   Procedure: RIGHT HEART CATH;  Surgeon: Larey Dresser, MD;  Location: [place] CV LAB;  Service: Cardiovascular;  Laterality: N/A;  . SHOULDER SURGERY     closed reduction  . TEE WITHOUT CARDIOVERSION N/A 06/29/2017   Procedure: TRANSESOPHAGEAL ECHOCARDIOGRAM (TEE);  Surgeon: Larey Dresser, MD;  Location: [facility] ENDOSCOPY;  Service: Cardiovascular;  Laterality: N/A;  . TONSILLECTOMY    . TOTAL HIP ARTHROPLASTY Left 03/18/2013   Procedure: TOTAL HIP ARTHROPLASTY ANTERIOR APPROACH;  Surgeon: Mauri Pole, MD;  Location: [facility] ORS;  Service: Orthopedics;  Laterality: Left;  . TUBAL LIGATION      Current Outpatient Medications  Medication Sig Dispense Refill  . acetaminophen (TYLENOL) 500 MG tablet Take 500 mg by mouth every 6 (six) hours as needed for headache.    . bisoprolol (ZEBETA) 5 MG tablet Take 0.5 tablets (2.5 mg total) by mouth 2 (two) times daily. 90 tablet 3  . digoxin (LANOXIN) 0.125 MG tablet Take 0.5 tablets (0.0625 mg total) by mouth daily. 45 tablet 3  . famotidine (PEPCID) 20 MG tablet Take 1 tablet (20 mg total) by mouth  daily. 30 tablet 2  . fluticasone (FLONASE) 50 MCG/ACT nasal spray Place 1 spray into both nostrils daily as needed for allergies or rhinitis.     . furosemide (LASIX) 20 MG tablet Take 1 tablet (20 mg total) by mouth every other day. 45 tablet 3  . LORazepam (ATIVAN) 0.5 MG tablet Take 1 tablet (0.5 mg total) by mouth daily as needed (nausea). 10 tablet 0  . losartan (COZAAR) 25 MG tablet  TAKE 1/2 TABLET BY MOUTH AT BEDTIME 15 tablet 6  . Rivaroxaban (XARELTO) 15 MG TABS tablet Take 1 tablet (15 mg total) by mouth daily with supper. 30 tablet 11  . spironolactone (ALDACTONE) 25 MG tablet Take 0.5 tablets (12.5 mg total) by mouth at bedtime. 30 tablet 6  . traMADol (ULTRAM) 50 MG tablet Take 1 tablet (50 mg total) by mouth every 6 (six) hours as needed. 12 tablet 0  . valACYclovir (VALTREX) 1000 MG tablet Take 1 tablet (1,000 mg total) by mouth 3 (three) times daily for 14 days. 42 tablet 0  . VITAMIN D PO Take by mouth.    . zolpidem (AMBIEN) 5 MG tablet Take 1 tablet (5 mg total) by mouth at bedtime. 5 tablet 0   No current facility-administered medications for this visit.    Allergies:   Sotalol, Alendronate sodium, Amiodarone, Ciprofloxacin, Compazine [prochlorperazine], Coreg [carvedilol], and Metoprolol   Social History:  The patient  reports that she has never smoked. She has never used smokeless tobacco. She reports current alcohol use of about 3.0 standard drinks of alcohol per week. She reports that she does not use drugs.   ROS:  Please see the history of present illness.   All other systems are personally reviewed and negative.    Exam:    Vital Signs:  There were no vitals taken for this visit.  Well sounding, alert and conversant   Labs/Other Tests and Data Reviewed:    Recent Labs: 09/27/2019: ALT 48; B Natriuretic Peptide 1,032.7 09/28/2019: TSH 2.672 09/30/2019: Hemoglobin 15.5; Magnesium 1.9; Platelets 176 10/09/2019: BUN 35; Creatinine, Ser 1.36; Potassium 3.6; Sodium 136   Wt Readings from Last 3 Encounters:  12/23/19 135 lb 3.2 oz (61.3 kg)  10/13/19 134 lb 12.8 oz (61.1 kg)  10/09/19 136 lb 3.2 oz (61.8 kg)     Last device remote is reviewed from [place] PDF which reveals normal device function, no arrhythmias   ASSESSMENT & PLAN:    1.  VT / PVCs Controlled with amiodarone previously She has stopped amiodarone due to fatigue and feeling  very tired. She has not previously tolerate mexiletine due to nausea.  Also had nause with ranolazine. Remotes are up to date No further sustained VT  2. Chronic systolic dysfunction/ nonischemic CM Stable No change required today ICM is stable Followed closely in the ICM device clinic  3. Complete heart block S/p AV nodl ablation and CRT  4. Permanent afib chads2vasc score is 4.  She is on xarelto   Follow-up:  4 months with EP APP I will see in 8 months if no changes   Patient Risk:  after full review of this patients clinical status, I feel that they are at high risk at this time.  Today, I have spent 15 minutes with the patient with telehealth technology discussing arrhythmia management .    Army Fossa, MD  01/12/2020 12:32 PM     [facility] [address] [place] [address] [phone] (office) [phone] (  fax)

## 2020-01-12 NOTE — Progress Notes (Signed)
EPIC Encounter for ICM Monitoring  Patient Name: Cheryl Vargas is a 85 y.o. female Date: 01/12/2020 Primary Care Physican: Lavone Orn, MD Primary Cardiologist:[place] Electrophysiologist:Allred Bi-V Pacing:97.9% 4/5/2021Weight:134.4lbs    Spoke with patient. She is experiencing shingles on left side.  Patient reports labs were drawn at HF clinic on 4/21 and she is waiting on results.  Advised there is no pending lab results for that date and the last labs in [place] are 1/28.  Message sent to HF clinic inquiring about the labs drawn on 4/21.    OptivolThoracic impedancereturned to normal after resuming prescribed Furosemide dosage of 20 mg every other day.  Prescribed: Furosemide20 mg take1tablet by mouth every other day.   Labs: 10/09/2019 Creatinine1.36[place]ie [place] [id number], [id number] 09/30/2019 Creatinine1.37, BUN34, Potassium4.1, [id number], [id number]  09/29/2019 Creatinine1.44, BUN30, Potassium4.2, [id number], [id number]  A complete set of results can be found in Results Review  Recommendations:No changes and encouraged to call if experiencing any fluid symptoms.  Follow-up plan: ICM clinic phone appointment on6/03/2020.91 day device clinic remote transmission6/30/2021. Office appt5/11/2019 with Dr Rayann Heman and 03/05/2020 with Dr Aundra Dubin.   Copy of ICM check sent to Dr.Allred.  3 month ICM trend: 01/12/2020    1 Year ICM trend:       Rosalene Billings, RN 01/12/2020 11:02 AM

## 2020-01-13 NOTE — Progress Notes (Signed)
Call back to patient regarding 4/21 labs at HF clinic.  Advised discussed with Adolm Joseph, CMA at HF clinic and the lab was not able to use the the blood specimen taken on 4/21.  Advised need to reschedule and she agreed to 01/26/2020 at 10:00AM to have blood redrawn that was ordered on 4/21.

## 2020-01-15 ENCOUNTER — Emergency Department (HOSPITAL_COMMUNITY)
Admission: EM | Admit: 2020-01-15 | Discharge: 2020-01-15 | Disposition: A | Discharge status: Home / Self Care | Payer: PPO | Attending: Emergency Medicine | Admitting: Emergency Medicine

## 2020-01-15 ENCOUNTER — Other Ambulatory Visit: Payer: Self-pay

## 2020-01-15 ENCOUNTER — Encounter (HOSPITAL_COMMUNITY): Payer: Self-pay | Admitting: Emergency Medicine

## 2020-01-15 DIAGNOSIS — R1084 Generalized abdominal pain: Secondary | ICD-10-CM | POA: Diagnosis not present

## 2020-01-15 DIAGNOSIS — K5900 Constipation, unspecified: Secondary | ICD-10-CM | POA: Insufficient documentation

## 2020-01-15 DIAGNOSIS — Z5321 Procedure and treatment not carried out due to patient leaving prior to being seen by health care provider: Secondary | ICD-10-CM | POA: Insufficient documentation

## 2020-01-15 DIAGNOSIS — R52 Pain, unspecified: Secondary | ICD-10-CM | POA: Diagnosis not present

## 2020-01-15 DIAGNOSIS — R109 Unspecified abdominal pain: Secondary | ICD-10-CM | POA: Insufficient documentation

## 2020-01-15 DIAGNOSIS — R110 Nausea: Secondary | ICD-10-CM | POA: Diagnosis not present

## 2020-01-15 DIAGNOSIS — R000 Tachycardia, unspecified: Secondary | ICD-10-CM | POA: Diagnosis not present

## 2020-01-15 LAB — COMPREHENSIVE METABOLIC PANEL
ALT: 33 U/L (ref 0–44)
AST: 40 U/L (ref 15–41)
Albumin: 3.4 g/dL — ABNORMAL LOW (ref 3.5–5.0)
Alkaline Phosphatase: 59 U/L (ref 38–126)
Anion gap: 12 (ref 5–15)
BUN: 16 mg/dL (ref 8–23)
CO2: 20 mmol/L — ABNORMAL LOW (ref 22–32)
Calcium: 9.1 mg/dL (ref 8.9–10.3)
Chloride: 104 mmol/L (ref 98–111)
Creatinine, Ser: 1.04 mg/dL — ABNORMAL HIGH (ref 0.44–1.00)
GFR calc Af Amer: 58 mL/min — ABNORMAL LOW (ref >60)
GFR calc non Af Amer: 50 mL/min — ABNORMAL LOW (ref >60)
Glucose, Bld: 114 mg/dL — ABNORMAL HIGH (ref 70–99)
Potassium: 4.1 mmol/L (ref 3.5–5.1)
Sodium: 136 mmol/L (ref 135–145)
Total Bilirubin: 1.5 mg/dL — ABNORMAL HIGH (ref 0.3–1.2)
Total Protein: 6.6 g/dL (ref 6.5–8.1)

## 2020-01-15 LAB — URINALYSIS, ROUTINE W REFLEX MICROSCOPIC
Bilirubin Urine: NEGATIVE
Glucose, UA: NEGATIVE mg/dL
Hgb urine dipstick: NEGATIVE
Ketones, ur: NEGATIVE mg/dL
Nitrite: POSITIVE — AB
Protein, ur: NEGATIVE mg/dL
Specific Gravity, Urine: 1.017 (ref 1.005–1.030)
pH: 5 (ref 5.0–8.0)

## 2020-01-15 LAB — CBC
HCT: 46.8 % — ABNORMAL HIGH (ref 36.0–46.0)
Hemoglobin: 16.1 g/dL — ABNORMAL HIGH (ref 12.0–15.0)
MCH: 32.7 pg (ref 26.0–34.0)
MCHC: 34.4 g/dL (ref 30.0–36.0)
MCV: 95.1 fL (ref 80.0–100.0)
Platelets: 213 10*3/uL (ref 150–400)
RBC: 4.92 MIL/uL (ref 3.87–5.11)
RDW: 12.3 % (ref 11.5–15.5)
WBC: 6.2 10*3/uL (ref 4.0–10.5)
nRBC: 0 % (ref 0.0–0.2)

## 2020-01-15 LAB — LIPASE, BLOOD: Lipase: 26 U/L (ref 11–51)

## 2020-01-15 MED ORDER — SODIUM CHLORIDE 0.9% FLUSH: 3.0000 mL | Freq: Once | INTRAVENOUS | Status: DC

## 2020-01-15 NOTE — ED Notes (Signed)
Pt called a taxi and stated that she is leaving.

## 2020-01-15 NOTE — ED Triage Notes (Signed)
Pt BIB [facility] from home, diagnosed with shingles on Saturday and started on antiviral meds. Pt reports that her abdominal pain and nausea started shortly after starting medications. Pt has tried suppositories and laxatives with little change.

## 2020-01-19 ENCOUNTER — Ambulatory Visit
Admission: RE | Admit: 2020-01-19 | Discharge: 2020-01-19 | Disposition: A | Discharge status: Home / Self Care | Payer: PPO | Source: Ambulatory Visit | Attending: Internal Medicine | Admitting: Internal Medicine

## 2020-01-19 ENCOUNTER — Other Ambulatory Visit: Payer: Self-pay | Admitting: Internal Medicine

## 2020-01-19 DIAGNOSIS — N390 Urinary tract infection, site not specified: Secondary | ICD-10-CM | POA: Diagnosis not present

## 2020-01-19 DIAGNOSIS — B029 Zoster without complications: Secondary | ICD-10-CM | POA: Diagnosis not present

## 2020-01-19 DIAGNOSIS — R1032 Left lower quadrant pain: Secondary | ICD-10-CM | POA: Diagnosis not present

## 2020-01-19 DIAGNOSIS — R110 Nausea: Secondary | ICD-10-CM

## 2020-01-19 DIAGNOSIS — R109 Unspecified abdominal pain: Secondary | ICD-10-CM | POA: Diagnosis not present

## 2020-01-19 MED ORDER — IOPAMIDOL (ISOVUE-300) INJECTION 61%
100.0000 mL | Freq: Once | INTRAVENOUS | Status: AC | PRN
  Administered 2020-01-19: 100 mL via INTRAVENOUS

## 2020-01-26 ENCOUNTER — Other Ambulatory Visit (HOSPITAL_COMMUNITY): Payer: PPO

## 2020-01-31 DIAGNOSIS — I4821 Permanent atrial fibrillation: Secondary | ICD-10-CM | POA: Diagnosis not present

## 2020-01-31 DIAGNOSIS — Z96642 Presence of left artificial hip joint: Secondary | ICD-10-CM | POA: Diagnosis not present

## 2020-01-31 DIAGNOSIS — M8580 Other specified disorders of bone density and structure, unspecified site: Secondary | ICD-10-CM | POA: Diagnosis not present

## 2020-01-31 DIAGNOSIS — I428 Other cardiomyopathies: Secondary | ICD-10-CM | POA: Diagnosis not present

## 2020-01-31 DIAGNOSIS — I442 Atrioventricular block, complete: Secondary | ICD-10-CM | POA: Diagnosis not present

## 2020-01-31 DIAGNOSIS — I110 Hypertensive heart disease with heart failure: Secondary | ICD-10-CM | POA: Diagnosis not present

## 2020-01-31 DIAGNOSIS — I050 Rheumatic mitral stenosis: Secondary | ICD-10-CM | POA: Diagnosis not present

## 2020-01-31 DIAGNOSIS — Z7901 Long term (current) use of anticoagulants: Secondary | ICD-10-CM | POA: Diagnosis not present

## 2020-01-31 DIAGNOSIS — M19079 Primary osteoarthritis, unspecified ankle and foot: Secondary | ICD-10-CM | POA: Diagnosis not present

## 2020-01-31 DIAGNOSIS — I5042 Chronic combined systolic (congestive) and diastolic (congestive) heart failure: Secondary | ICD-10-CM | POA: Diagnosis not present

## 2020-01-31 DIAGNOSIS — G4700 Insomnia, unspecified: Secondary | ICD-10-CM | POA: Diagnosis not present

## 2020-01-31 DIAGNOSIS — Z9181 History of falling: Secondary | ICD-10-CM | POA: Diagnosis not present

## 2020-01-31 DIAGNOSIS — E7800 Pure hypercholesterolemia, unspecified: Secondary | ICD-10-CM | POA: Diagnosis not present

## 2020-01-31 DIAGNOSIS — Z87440 Personal history of urinary (tract) infections: Secondary | ICD-10-CM | POA: Diagnosis not present

## 2020-02-01 ENCOUNTER — Inpatient Hospital Stay (HOSPITAL_COMMUNITY)
Admission: EM | Admit: 2020-02-01 | Discharge: 2020-02-06 | DRG: 690 | Disposition: A | Discharge status: Skilled Nursing Facility | Payer: PPO | Attending: Internal Medicine | Admitting: Internal Medicine

## 2020-02-01 ENCOUNTER — Encounter (HOSPITAL_COMMUNITY): Payer: Self-pay | Admitting: Emergency Medicine

## 2020-02-01 ENCOUNTER — Emergency Department (HOSPITAL_COMMUNITY): Payer: PPO

## 2020-02-01 ENCOUNTER — Other Ambulatory Visit: Payer: Self-pay

## 2020-02-01 DIAGNOSIS — N1831 Chronic kidney disease, stage 3a: Secondary | ICD-10-CM | POA: Diagnosis present

## 2020-02-01 DIAGNOSIS — Z7901 Long term (current) use of anticoagulants: Secondary | ICD-10-CM

## 2020-02-01 DIAGNOSIS — Z20822 Contact with and (suspected) exposure to covid-19: Secondary | ICD-10-CM | POA: Diagnosis not present

## 2020-02-01 DIAGNOSIS — R2689 Other abnormalities of gait and mobility: Secondary | ICD-10-CM | POA: Diagnosis not present

## 2020-02-01 DIAGNOSIS — Z79899 Other long term (current) drug therapy: Secondary | ICD-10-CM | POA: Diagnosis not present

## 2020-02-01 DIAGNOSIS — Z515 Encounter for palliative care: Secondary | ICD-10-CM | POA: Diagnosis not present

## 2020-02-01 DIAGNOSIS — B9620 Unspecified Escherichia coli [E. coli] as the cause of diseases classified elsewhere: Secondary | ICD-10-CM | POA: Diagnosis not present

## 2020-02-01 DIAGNOSIS — R531 Weakness: Secondary | ICD-10-CM

## 2020-02-01 DIAGNOSIS — R778 Other specified abnormalities of plasma proteins: Secondary | ICD-10-CM | POA: Diagnosis not present

## 2020-02-01 DIAGNOSIS — Z95810 Presence of automatic (implantable) cardiac defibrillator: Secondary | ICD-10-CM

## 2020-02-01 DIAGNOSIS — B028 Zoster with other complications: Secondary | ICD-10-CM | POA: Diagnosis not present

## 2020-02-01 DIAGNOSIS — Z7189 Other specified counseling: Secondary | ICD-10-CM

## 2020-02-01 DIAGNOSIS — I428 Other cardiomyopathies: Secondary | ICD-10-CM | POA: Diagnosis present

## 2020-02-01 DIAGNOSIS — N3001 Acute cystitis with hematuria: Secondary | ICD-10-CM

## 2020-02-01 DIAGNOSIS — E860 Dehydration: Secondary | ICD-10-CM | POA: Diagnosis not present

## 2020-02-01 DIAGNOSIS — Z8619 Personal history of other infectious and parasitic diseases: Secondary | ICD-10-CM | POA: Diagnosis not present

## 2020-02-01 DIAGNOSIS — Z96642 Presence of left artificial hip joint: Secondary | ICD-10-CM | POA: Diagnosis not present

## 2020-02-01 DIAGNOSIS — M6281 Muscle weakness (generalized): Secondary | ICD-10-CM | POA: Diagnosis not present

## 2020-02-01 DIAGNOSIS — E440 Moderate protein-calorie malnutrition: Secondary | ICD-10-CM | POA: Diagnosis not present

## 2020-02-01 DIAGNOSIS — B029 Zoster without complications: Secondary | ICD-10-CM | POA: Diagnosis not present

## 2020-02-01 DIAGNOSIS — I959 Hypotension, unspecified: Secondary | ICD-10-CM | POA: Diagnosis present

## 2020-02-01 DIAGNOSIS — I4819 Other persistent atrial fibrillation: Secondary | ICD-10-CM | POA: Diagnosis not present

## 2020-02-01 DIAGNOSIS — E785 Hyperlipidemia, unspecified: Secondary | ICD-10-CM | POA: Diagnosis not present

## 2020-02-01 DIAGNOSIS — R0602 Shortness of breath: Secondary | ICD-10-CM | POA: Diagnosis not present

## 2020-02-01 DIAGNOSIS — R110 Nausea: Secondary | ICD-10-CM

## 2020-02-01 DIAGNOSIS — R9431 Abnormal electrocardiogram [ECG] [EKG]: Secondary | ICD-10-CM | POA: Diagnosis present

## 2020-02-01 DIAGNOSIS — I4891 Unspecified atrial fibrillation: Secondary | ICD-10-CM | POA: Diagnosis not present

## 2020-02-01 DIAGNOSIS — R1110 Vomiting, unspecified: Secondary | ICD-10-CM | POA: Diagnosis not present

## 2020-02-01 DIAGNOSIS — I5023 Acute on chronic systolic (congestive) heart failure: Secondary | ICD-10-CM | POA: Diagnosis not present

## 2020-02-01 DIAGNOSIS — I480 Paroxysmal atrial fibrillation: Secondary | ICD-10-CM | POA: Diagnosis not present

## 2020-02-01 DIAGNOSIS — I10 Essential (primary) hypertension: Secondary | ICD-10-CM | POA: Diagnosis not present

## 2020-02-01 DIAGNOSIS — K5900 Constipation, unspecified: Secondary | ICD-10-CM | POA: Diagnosis not present

## 2020-02-01 DIAGNOSIS — G4700 Insomnia, unspecified: Secondary | ICD-10-CM | POA: Diagnosis not present

## 2020-02-01 DIAGNOSIS — R2681 Unsteadiness on feet: Secondary | ICD-10-CM | POA: Diagnosis not present

## 2020-02-01 DIAGNOSIS — I5042 Chronic combined systolic (congestive) and diastolic (congestive) heart failure: Secondary | ICD-10-CM | POA: Diagnosis present

## 2020-02-01 DIAGNOSIS — I5041 Acute combined systolic (congestive) and diastolic (congestive) heart failure: Secondary | ICD-10-CM | POA: Diagnosis present

## 2020-02-01 DIAGNOSIS — I130 Hypertensive heart and chronic kidney disease with heart failure and stage 1 through stage 4 chronic kidney disease, or unspecified chronic kidney disease: Secondary | ICD-10-CM | POA: Diagnosis present

## 2020-02-01 DIAGNOSIS — N390 Urinary tract infection, site not specified: Secondary | ICD-10-CM | POA: Diagnosis not present

## 2020-02-01 DIAGNOSIS — I4821 Permanent atrial fibrillation: Secondary | ICD-10-CM | POA: Diagnosis present

## 2020-02-01 LAB — COMPREHENSIVE METABOLIC PANEL
ALT: 24 U/L (ref 0–44)
AST: 30 U/L (ref 15–41)
Albumin: 3.5 g/dL (ref 3.5–5.0)
Alkaline Phosphatase: 51 U/L (ref 38–126)
Anion gap: 13 (ref 5–15)
BUN: 19 mg/dL (ref 8–23)
CO2: 23 mmol/L (ref 22–32)
Calcium: 9.2 mg/dL (ref 8.9–10.3)
Chloride: 100 mmol/L (ref 98–111)
Creatinine, Ser: 1.15 mg/dL — ABNORMAL HIGH (ref 0.44–1.00)
GFR calc Af Amer: 51 mL/min — ABNORMAL LOW (ref >60)
GFR calc non Af Amer: 44 mL/min — ABNORMAL LOW (ref >60)
Glucose, Bld: 134 mg/dL — ABNORMAL HIGH (ref 70–99)
Potassium: 4.6 mmol/L (ref 3.5–5.1)
Sodium: 136 mmol/L (ref 135–145)
Total Bilirubin: 1.8 mg/dL — ABNORMAL HIGH (ref 0.3–1.2)
Total Protein: 6.8 g/dL (ref 6.5–8.1)

## 2020-02-01 LAB — URINALYSIS, ROUTINE W REFLEX MICROSCOPIC
Glucose, UA: NEGATIVE mg/dL
Ketones, ur: 15 mg/dL — AB
Nitrite: POSITIVE — AB
Protein, ur: 100 mg/dL — AB
Specific Gravity, Urine: 1.02 (ref 1.005–1.030)
pH: 5 (ref 5.0–8.0)

## 2020-02-01 LAB — SARS CORONAVIRUS 2 BY RT PCR (HOSPITAL ORDER, PERFORMED IN ~~LOC~~ HOSPITAL LAB): SARS Coronavirus 2: NEGATIVE

## 2020-02-01 LAB — CBC
HCT: 46.4 % — ABNORMAL HIGH (ref 36.0–46.0)
Hemoglobin: 16.4 g/dL — ABNORMAL HIGH (ref 12.0–15.0)
MCH: 34.7 pg — ABNORMAL HIGH (ref 26.0–34.0)
MCHC: 35.3 g/dL (ref 30.0–36.0)
MCV: 98.1 fL (ref 80.0–100.0)
Platelets: 213 10*3/uL (ref 150–400)
RBC: 4.73 MIL/uL (ref 3.87–5.11)
RDW: 13.1 % (ref 11.5–15.5)
WBC: 7 10*3/uL (ref 4.0–10.5)
nRBC: 0 % (ref 0.0–0.2)

## 2020-02-01 LAB — URINALYSIS, MICROSCOPIC (REFLEX): WBC, UA: >50 WBC/hpf (ref 0–5)

## 2020-02-01 LAB — TROPONIN I (HIGH SENSITIVITY)
Troponin I (High Sensitivity): 34 ng/L — ABNORMAL HIGH (ref <18)
Troponin I (High Sensitivity): 42 ng/L — ABNORMAL HIGH (ref <18)

## 2020-02-01 LAB — LACTIC ACID, PLASMA
Lactic Acid, Venous: 1.9 mmol/L (ref 0.5–1.9)
Lactic Acid, Venous: 2.1 mmol/L (ref 0.5–1.9)

## 2020-02-01 LAB — LIPASE, BLOOD: Lipase: 27 U/L (ref 11–51)

## 2020-02-01 MED ORDER — SODIUM CHLORIDE 0.9 % IV BOLUS: 500.0000 mL | Freq: Once | INTRAVENOUS | Status: DC

## 2020-02-01 MED ORDER — ACETAMINOPHEN 650 MG RE SUPP: 650.0000 mg | Freq: Four times a day (QID) | RECTAL | Status: DC | PRN

## 2020-02-01 MED ORDER — SODIUM CHLORIDE 0.9 % IV BOLUS: 250.0000 mL | Freq: Once | INTRAVENOUS | Status: DC

## 2020-02-01 MED ORDER — ONDANSETRON HCL 4 MG/2ML IJ SOLN: 4.0000 mg | Freq: Four times a day (QID) | INTRAMUSCULAR | Status: DC | PRN

## 2020-02-01 MED ORDER — LORAZEPAM 0.5 MG PO TABS
0.2500 mg | ORAL_TABLET | Freq: Four times a day (QID) | ORAL | Status: DC | PRN
  Administered 2020-02-02 – 2020-02-03 (×5): 0.25 mg via ORAL
  Filled 2020-02-01 (×5): qty 1

## 2020-02-01 MED ORDER — SODIUM CHLORIDE 0.9 % IV SOLN
1.0000 g | Freq: Once | INTRAVENOUS | Status: AC
  Administered 2020-02-01: 1 g via INTRAVENOUS
  Filled 2020-02-01: qty 10

## 2020-02-01 MED ORDER — ZOLPIDEM TARTRATE 5 MG PO TABS
5.0000 mg | ORAL_TABLET | Freq: Every day | ORAL | Status: DC
  Administered 2020-02-01 – 2020-02-05 (×5): 5 mg via ORAL
  Filled 2020-02-01 (×5): qty 1

## 2020-02-01 MED ORDER — FAMOTIDINE 20 MG PO TABS
20.0000 mg | ORAL_TABLET | Freq: Every day | ORAL | Status: DC | PRN
  Administered 2020-02-06: 20 mg via ORAL
  Filled 2020-02-01: qty 1

## 2020-02-01 MED ORDER — ALBUTEROL SULFATE (2.5 MG/3ML) 0.083% IN NEBU: 2.5000 mg | INHALATION_SOLUTION | Freq: Four times a day (QID) | RESPIRATORY_TRACT | Status: DC | PRN

## 2020-02-01 MED ORDER — SODIUM CHLORIDE 0.9% FLUSH
3.0000 mL | Freq: Once | INTRAVENOUS | Status: AC
  Administered 2020-02-01: 3 mL via INTRAVENOUS

## 2020-02-01 MED ORDER — SODIUM CHLORIDE 0.9 % IV BOLUS
500.0000 mL | Freq: Once | INTRAVENOUS | Status: AC
  Administered 2020-02-01: 500 mL via INTRAVENOUS

## 2020-02-01 MED ORDER — RIVAROXABAN 15 MG PO TABS
15.0000 mg | ORAL_TABLET | Freq: Every day | ORAL | Status: DC
  Administered 2020-02-02 – 2020-02-05 (×4): 15 mg via ORAL
  Filled 2020-02-01 (×5): qty 1

## 2020-02-01 MED ORDER — DIGOXIN 125 MCG PO TABS
0.0625 mg | ORAL_TABLET | Freq: Every day | ORAL | Status: DC
  Administered 2020-02-01 – 2020-02-06 (×6): 0.0625 mg via ORAL
  Filled 2020-02-01 (×6): qty 1

## 2020-02-01 MED ORDER — FLUTICASONE PROPIONATE 50 MCG/ACT NA SUSP
1.0000 | Freq: Every day | NASAL | Status: DC | PRN
  Filled 2020-02-01: qty 16

## 2020-02-01 MED ORDER — ONDANSETRON HCL 4 MG PO TABS: 4.0000 mg | ORAL_TABLET | Freq: Four times a day (QID) | ORAL | Status: DC | PRN

## 2020-02-01 MED ORDER — SODIUM CHLORIDE 0.9 % IV BOLUS
250.0000 mL | Freq: Once | INTRAVENOUS | Status: AC
  Administered 2020-02-01: 250 mL via INTRAVENOUS

## 2020-02-01 MED ORDER — SODIUM CHLORIDE 0.9% FLUSH
3.0000 mL | Freq: Two times a day (BID) | INTRAVENOUS | Status: DC
  Administered 2020-02-01 – 2020-02-06 (×10): 3 mL via INTRAVENOUS

## 2020-02-01 MED ORDER — TRAMADOL HCL 50 MG PO TABS
50.0000 mg | ORAL_TABLET | Freq: Four times a day (QID) | ORAL | Status: DC | PRN
  Administered 2020-02-02 – 2020-02-03 (×4): 50 mg via ORAL
  Filled 2020-02-01 (×4): qty 1

## 2020-02-01 MED ORDER — ACETAMINOPHEN 500 MG PO TABS
1000.0000 mg | ORAL_TABLET | Freq: Once | ORAL | Status: AC
  Administered 2020-02-01: 1000 mg via ORAL
  Filled 2020-02-01: qty 2

## 2020-02-01 MED ORDER — LORAZEPAM 1 MG PO TABS
0.5000 mg | ORAL_TABLET | Freq: Once | ORAL | Status: AC
  Administered 2020-02-01: 0.5 mg via ORAL
  Filled 2020-02-01: qty 1

## 2020-02-01 MED ORDER — SODIUM CHLORIDE 0.9 % IV SOLN
1.0000 g | INTRAVENOUS | Status: DC
  Administered 2020-02-02 – 2020-02-03 (×2): 1 g via INTRAVENOUS
  Filled 2020-02-01 (×2): qty 10
  Filled 2020-02-01: qty 1

## 2020-02-01 MED ORDER — ACETAMINOPHEN 325 MG PO TABS
650.0000 mg | ORAL_TABLET | Freq: Four times a day (QID) | ORAL | Status: DC | PRN
  Administered 2020-02-01 – 2020-02-02 (×3): 650 mg via ORAL
  Filled 2020-02-01 (×3): qty 2

## 2020-02-01 NOTE — H&P (Addendum)
History and Physical    Cheryl Vargas XTG:[id number] DOB: Jan 18, 1935 DOA: 02/01/2020  Referring MD/NP/PA: Dene Gentry, MD  PCP: Lavone Orn, MD  Patient coming from: home  Chief Complaint: Nausea and weakness  I have personally briefly reviewed patient's old medical records in [place]   HPI: Cheryl Vargas is a 85 y.o. female with medical history significant of persistent atrial fibrillation, chronic systolic congestive heart failure last EF noted to be <20%, s/p AICD, CKD stage III, and arthritis presents with complaints of worsening nausea and weakness over the last 1 week.  Patient had previously been diagnosed with shingles on 5/1 and had been treated for about 7 days with Valtrex.  Since that time she is continued to have pain on the left lower flank, but reports that lesions have dried up.  She has been taking Tylenol as needed for the pain.  Her primary care provider and ordered a CT scan of the abdomen pelvis with contrast due to her symptoms on 5/10 and at that time noted trace gas within the bladder lumen suspicious for urinary tract infection.  At that time she was not having symptoms and took nitrofurantoin for about 4 days before stopping.  However, since that time has had urinary frequency with decreased urine output and constipation with subsequent diarrhea.  She had taken MiraLAX the other night prior to having significant amount of diarrhea.  Overall she has had poor appetite due to her symptoms although she denies having any vomiting.  Denies having any lower extremity swelling, shortness of breath, cough, or fever symptoms.  She has not been taking her Lasix recently because she is felt dehydrated.  She cannot take antiemetics due to history of prolonged QT.  ED Course: Upon admission into the emergency patient was seen to be hypotensive with blood pressures as low as 87/55, but improved with IV fluids.  Labs significant for hemoglobin 16.4, BUN 19, and creatinine  1.15.  Urinalysis was positive for moderate leukocytes, nitrites, and greater than 50 WBCs.  Patient was given 500 mL of normal saline IV fluids, Tylenol, Ativan, and empiric antibiotics of Rocephin.  TRH called to admit.  Review of Systems  Constitutional: Positive for malaise/fatigue. Negative for fever.  HENT: Negative for nosebleeds and sinus pain.   Eyes: Negative for photophobia and pain.  Respiratory: Negative for cough and shortness of breath.   Cardiovascular: Negative for chest pain and leg swelling.  Gastrointestinal: Positive for abdominal pain, constipation, diarrhea, heartburn and nausea. Negative for vomiting.  Genitourinary: Positive for frequency. Negative for dysuria.  Musculoskeletal: Positive for back pain and joint pain. Negative for falls.  Skin: Positive for rash. Negative for itching.  Neurological: Positive for weakness. Negative for focal weakness and loss of consciousness.  Endo/Heme/Allergies: Negative for environmental allergies.  Psychiatric/Behavioral: Negative for memory loss and substance abuse.    Past Medical History:  Diagnosis Date  . Arthritis   . Chronic combined systolic (congestive) and diastolic (congestive) heart failure ([place])   . Mitral regurgitation   . Non-ischemic cardiomyopathy ([place])   . Persistent atrial fibrillation ([place])   . Ventricular tachycardia [facility])     Past Surgical History:  Procedure Laterality Date  . AV NODE ABLATION N/A 09/07/2017   Procedure: AV NODE ABLATION;  Surgeon: [name], MD;  Location: [place] CV LAB;  Service: Cardiovascular;  Laterality: N/A;  . BIV ICD INSERTION CRT-D N/A 09/07/2017   Procedure: BIV ICD INSERTION CRT-D;  Surgeon: [name], MD;  Location: [place] CV LAB;  Service: Cardiovascular;  Laterality: N/A;  . BUNIONECTOMY    . CARDIAC CATHETERIZATION     in 2004 at [facility]. "Insignificant blockage" per patient  . CARDIAC CATHETERIZATION N/A 04/25/2016   Procedure: Left Heart Cath  and Coronary Angiography;  Surgeon: Nelva Bush, MD;  Location: [place] CV LAB;  Service: Cardiovascular;  Laterality: N/A;  . CARDIOVERSION N/A 06/29/2017   Procedure: CARDIOVERSION;  Surgeon: Larey Dresser, MD;  Location: [facility] ENDOSCOPY;  Service: Cardiovascular;  Laterality: N/A;  . CARDIOVERSION N/A 08/28/2017   Procedure: CARDIOVERSION;  Surgeon: Larey Dresser, MD;  Location: [facility] ENDOSCOPY;  Service: Cardiovascular;  Laterality: N/A;  . RIGHT HEART CATH N/A 09/29/2019   Procedure: RIGHT HEART CATH;  Surgeon: Larey Dresser, MD;  Location: [place] CV LAB;  Service: Cardiovascular;  Laterality: N/A;  . SHOULDER SURGERY     closed reduction  . TEE WITHOUT CARDIOVERSION N/A 06/29/2017   Procedure: TRANSESOPHAGEAL ECHOCARDIOGRAM (TEE);  Surgeon: Larey Dresser, MD;  Location: [facility] ENDOSCOPY;  Service: Cardiovascular;  Laterality: N/A;  . TONSILLECTOMY    . TOTAL HIP ARTHROPLASTY Left 03/18/2013   Procedure: TOTAL HIP ARTHROPLASTY ANTERIOR APPROACH;  Surgeon: Mauri Pole, MD;  [facility] ORS;  Service: Orthopedics;  Laterality: Left;  . TUBAL LIGATION       reports that she has never smoked. She has never used smokeless tobacco. She reports current alcohol use of about 3.0 standard drinks of alcohol per week. She reports that she does not use drugs.  Allergies  Allergen Reactions  . Sotalol Other (See Comments)    Prolonged QTc  . Alendronate Sodium Other (See Comments)    Arm pain  . Amiodarone Nausea Only  . Ciprofloxacin Other (See Comments)    Not effective  . Compazine [Prochlorperazine]   . Coreg [Carvedilol] Other (See Comments)    Fatigue   . Metoprolol Other (See Comments)    Profound fatigue    Family History  Problem Relation Age of Onset  . Lung cancer Mother   . Heart attack Father     Prior to Admission medications   Medication Sig Start Date End Date Taking? Authorizing Provider  acetaminophen (TYLENOL) 500 MG tablet Take 500 mg by mouth  every 6 (six) hours as needed for headache.    [provider]  bisoprolol (ZEBETA) 5 MG tablet Take 0.5 mg by mouth daily.    [provider]  digoxin (LANOXIN) 0.125 MG tablet Take 0.5 tablets (0.0625 mg total) by mouth daily. 12/23/19   Larey Dresser, MD  famotidine (PEPCID) 20 MG tablet Take 1 tablet (20 mg total) by mouth daily. 05/08/19 01/12/20  Barb Merino, MD  fluticasone (FLONASE) 50 MCG/ACT nasal spray Place 1 spray into both nostrils daily as needed for allergies or rhinitis.     [provider]  furosemide (LASIX) 20 MG tablet Take 1 tablet (20 mg total) by mouth every other day. 12/23/19   Larey Dresser, MD  LORazepam (ATIVAN) 0.5 MG tablet Take 1 tablet (0.5 mg total) by mouth daily as needed (nausea). 10/09/19   Clegg, [name] D, NP  losartan (COZAAR) 25 MG tablet TAKE 1/2 TABLET BY MOUTH AT BEDTIME 06/17/19   Larey Dresser, MD  Rivaroxaban (XARELTO) 15 MG TABS tablet Take 1 tablet (15 mg total) by mouth daily with supper. 12/11/19   Larey Dresser, MD  spironolactone (ALDACTONE) 25 MG tablet Take 0.5 tablets (12.5 mg total) by mouth  at bedtime. 09/30/19   Clegg, [name] D, NP  traMADol (ULTRAM) 50 MG tablet Take 1 tablet (50 mg total) by mouth every 6 (six) hours as needed. 01/10/20   Wieters, Hallie C, PA-C  VITAMIN D PO Take by mouth.    [provider]  zolpidem (AMBIEN) 5 MG tablet Take 1 tablet (5 mg total) by mouth at bedtime. 09/20/17   Mariel Aloe, MD    Physical Exam:  Constitutional: Elderly female who appears to be in no acute distress at this time. Vitals:   02/01/20 1312 02/01/20 1313 02/01/20 1350  BP: (!) 87/55  (!) 103/59  Pulse: 80  80  Resp: (!) 80  13  Temp: 97.8 F (36.6 C)  98.3 F (36.8 C)  TempSrc: Oral  Oral  SpO2: 100%  100%  Weight:  56.7 kg   Height:  5\' 5"  (1.651 m)    Eyes: PERRL, lids and conjunctivae normal ENMT: Mucous membranes are moist. Posterior pharynx clear of any exudate or lesions.Normal  dentition.  Neck: normal, supple, no masses, no thyromegaly Respiratory: clear to auscultation bilaterally, no wheezing, no crackles. Normal respiratory effort. No accessory muscle use.  Cardiovascular: Irregularly irregular, no murmurs / rubs / gallops. No extremity edema. 2+ pedal pulses. No carotid bruits.  Abdomen: no tenderness, no masses palpated. No hepatosplenomegaly. Bowel sounds positive.  Musculoskeletal: no clubbing / cyanosis. No joint deformity upper and lower extremities. Good ROM, no contractures. Normal muscle tone.  Skin: Dried shingles lesions noted of the left lower abdomen and flank. Neurologic: CN 2-12 grossly intact. Sensation intact, DTR normal. Strength 5/5 in all 4.  Psychiatric: Normal judgment and insight. Alert and oriented x 3. Normal mood.     Labs on Admission: I have personally reviewed following labs and imaging studies  CBC: Recent Labs  Lab 02/01/20 1327  WBC 7.0  HGB 16.4*  HCT 46.4*  MCV 98.1  PLT 528   Basic Metabolic Panel: Recent Labs  Lab 02/01/20 1327  NA 136  K 4.6  CL 100  CO2 23  GLUCOSE 134*  BUN 19  CREATININE 1.15*  CALCIUM 9.2   GFR: Estimated Creatinine Clearance: 33.8 mL/min (A) (by C-G formula based on SCr of 1.15 mg/dL (H)). Liver Function Tests: Recent Labs  Lab 02/01/20 1327  AST 30  ALT 24  ALKPHOS 51  BILITOT 1.8*  PROT 6.8  ALBUMIN 3.5   Recent Labs  Lab 02/01/20 1327  LIPASE 27   No results for input(s): AMMONIA in the last 168 hours. Coagulation Profile: No results for input(s): INR, PROTIME in the last 168 hours. Cardiac Enzymes: No results for input(s): CKTOTAL, CKMB, CKMBINDEX, TROPONINI in the last 168 hours. BNP (last 3 results) No results for input(s): PROBNP in the last 8760 hours. HbA1C: No results for input(s): HGBA1C in the last 72 hours. CBG: No results for input(s): GLUCAP in the last 168 hours. Lipid Profile: No results for input(s): CHOL, HDL, LDLCALC, TRIG, CHOLHDL, LDLDIRECT  in the last 72 hours. Thyroid Function Tests: No results for input(s): TSH, T4TOTAL, FREET4, T3FREE, THYROIDAB in the last 72 hours. Anemia Panel: No results for input(s): VITAMINB12, FOLATE, FERRITIN, TIBC, IRON, RETICCTPCT in the last 72 hours. Urine analysis:    Component Value Date/Time   COLORURINE ORANGE (A) 02/01/2020 1407   APPEARANCEUR CLOUDY (A) 02/01/2020 1407   LABSPEC 1.020 02/01/2020 1407   PHURINE 5.0 02/01/2020 1407   GLUCOSEU NEGATIVE 02/01/2020 1407   HGBUR TRACE (A) 02/01/2020 1407  BILIRUBINUR SMALL (A) 02/01/2020 1407   KETONESUR 15 (A) 02/01/2020 1407   PROTEINUR 100 (A) 02/01/2020 1407   UROBILINOGEN 0.2 03/17/2013 1423   NITRITE POSITIVE (A) 02/01/2020 1407   LEUKOCYTESUR MODERATE (A) 02/01/2020 1407   Sepsis Labs: No results found for this or any previous visit (from the past 240 hour(s)).   Radiological Exams on Admission: DG Chest Port 1 View  Result Date: 02/01/2020 CLINICAL DATA:  Shortness of breath EXAM: PORTABLE CHEST 1 VIEW COMPARISON:  September 27, 2019 FINDINGS: Stable pacemaker and cardiomegaly. The hila and mediastinum are normal. No pneumothorax. No pulmonary nodules or masses. No focal infiltrates. IMPRESSION: No active disease. Electronically Signed   By: Dorise Bullion III M.D   On: 02/01/2020 14:30    EKG: Independently reviewed.  Atrial fibrillation at 80 bpm  Assessment/Plan Urinary tract infection: Acute.  Patient presents with complaints of nausea and urinary frequency.  Patient with prior CT scan of the abdomen and pelvis on 5/10 which showed concern for the possibility of UTI.  Patient reportedly took about 5 days worth of nitrofurantoin at that time. Records note cultures back in 2014 were positive for E. coli that was indeterminate susceptibility to Macrobid. Urinalysis on admission positive for signs of infection.    Patient was treated with empiric antibiotics of Rocephin. -Admit to medical telemetry bed -Follow-up urine  culture -Empiric antibiotics of Rocephin  Transient hypotension due to dehydration: Acute.  Initial blood pressures noted to be as low as 79/48.  Home blood pressure medications include losartan 12.5 mg daily, spironolactone 12.5 mg nightly, bisoprolol 0.5 mg daily, and furosemide 20 mg every 48 hours as needed for fluid. -Hold blood pressure medications -Restart home blood pressure medications when medically appropriate  Nausea: Suspect secondary to urinary tract infection. -Avoid antiemetics due to history of prolonged QT interval -Low-dose Ativan as needed for nausea   Shingles: Patient initially diagnosed with shingles on May 1 and treated for about 7 days with valacyclovir prior to stopping medication. -Continue Tylenol/tramadol as needed for pain  Elevated troponin: Chronic.  Initial troponin noted to be mildly elevated at 34 on admission, but appears similar to previous.  Patient denies any chest pain complaints, but does note continued indigestion and nausea. -Continue to monitor  Chronic systolic and diastolic congestive heart failure,: Patient appears to be hypovolemic at this time.  Last EF noted to be around 20%. -Strict intake and output -Daily weight  -Fluid restriction of 1500 mL daily  Persistent atrial fibrillation on chronic anticoagulation: Patient appears rate controlled at this time.  CHA2DS2-VASc score = at least 4. -Continue digoxin and Xarelto  History of VT s/p AICD  DVT prophylaxis: Xarelto Code Status: Full Family Communication: Son  Disposition Plan: Possible discharge home in 2 to 3 days Consults called: None Admission status: Inpatient  Norval Morton MD [facility] Pager [phone]   If 7PM-7AM, please contact night-coverage [email or web address] Password [facility]  02/01/2020, 2:38 PM

## 2020-02-01 NOTE — Progress Notes (Signed)
Paged by RN that lactate is 2.1. Patient with UTI. EF<20%. Has received 750 mL thus far. Will gently give another 250 mL and recheck lactate at 0100.  Barrington Ellison, MD [facility]

## 2020-02-01 NOTE — Progress Notes (Signed)
CRITICAL VALUE ALERT  Critical Value:  Lactic Acid 2.1  Date & Time Notied:  02/01/20 2210  Provider Notified: Night coverage C. Fair  Orders Received/Actions taken: No new orders at this time

## 2020-02-01 NOTE — ED Provider Notes (Signed)
[facility] DEPARTMENT Provider Note   CSN: [id number] Arrival date & time: 02/01/20  1251     History Chief Complaint  Patient presents with  . Diarrhea  . Nausea    Cheryl Vargas is a 85 y.o. female.  85 year old female with prior medical history as detailed below presents for evaluation of generalized weakness, nausea, and possible UTI.  Patient reports onset of symptoms over the last week and a half.  Patient reports worsening nausea.  She denies fever.  She reports decreased p.o. intake secondary to her nausea.  She reports increased urination and dysuria consistent with likely UTI.  She endorses worsening weakness to the point where she is having trouble ambulating around her home secondary to weakness.  She denies chest pain or shortness of breath.  The history is provided by the patient and medical records.  Illness Location:  Weakness, with nausea, dehydration, possible UTI Severity:  Mild Onset quality:  Gradual Duration:  1 week Progression:  Worsening Chronicity:  New Associated symptoms: no fever and no shortness of breath        Past Medical History:  Diagnosis Date  . Arthritis   . Chronic combined systolic (congestive) and diastolic (congestive) heart failure ([place])   . Mitral regurgitation   . Non-ischemic cardiomyopathy ([place])   . Persistent atrial fibrillation ([place])   . Ventricular tachycardia [facility])     Patient Active Problem List   Diagnosis Date Noted  . Near syncope 05/05/2019  . Hypercholesterolemia without hypertriglyceridemia 02/13/2018  . Syncope 01/21/2018  . Constipation 01/21/2018  . CHF exacerbation ([place]) 12/18/2017  . CKD (chronic kidney disease), stage III 12/16/2017  . SOB (shortness of breath)   . Abdominal pain 09/18/2017  . CHF (congestive heart failure) ([place]) 09/17/2017  . Pulmonary edema   . Cardiac device in situ   . Ventricular tachycardia ([place])   . Cardiac arrest ([place])   . Systolic and  diastolic CHF, acute ([place])   . Severe mitral valve regurgitation   . A-fib ([place]) 09/03/2017  . Mitral regurgitation   . Acute on chronic systolic heart failure ([place])   . Non-ischemic cardiomyopathy ([place])   . CHF (congestive heart failure), NYHA class IV ([place]) 04/24/2016  . Dizziness 12/01/2015  . Hypertension 12/01/2015  . Insomnia 12/01/2015  . Paroxysmal atrial fibrillation ([facility])   . Cardiac arrhythmia 10/20/2014  . Systolic dysfunction 24/05/7352  . Hypoventilation, idiopathic 10/20/2014  . Other fatigue 10/20/2014  . Closed left hip fracture ([place]) 03/17/2013  . Cardiomegaly 03/17/2013  . Frequent PVCs 03/17/2013  . Hyperlipidemia 03/17/2013    Past Surgical History:  Procedure Laterality Date  . AV NODE ABLATION N/A 09/07/2017   Procedure: AV NODE ABLATION;  Surgeon: [name], MD;  Location: [facility] CV LAB;  Service: Cardiovascular;  Laterality: N/A;  . BIV ICD INSERTION CRT-D N/A 09/07/2017   Procedure: BIV ICD INSERTION CRT-D;  Surgeon: [name], MD;  Location: [place] CV LAB;  Service: Cardiovascular;  Laterality: N/A;  . BUNIONECTOMY    . CARDIAC CATHETERIZATION     in 2004 at [facility]. "Insignificant blockage" per patient  . CARDIAC CATHETERIZATION N/A 04/25/2016   Procedure: Left Heart Cath and Coronary Angiography;  Surgeon: Nelva Bush, MD;  Location: [facility] CV LAB;  Service: Cardiovascular;  Laterality: N/A;  . CARDIOVERSION N/A 06/29/2017   Procedure: CARDIOVERSION;  Surgeon: Larey Dresser, MD;  Location: [facility] ENDOSCOPY;  Service: Cardiovascular;  Laterality: N/A;  . CARDIOVERSION N/A 08/28/2017  Procedure: CARDIOVERSION;  Surgeon: Larey Dresser, MD;  Location: [facility] ENDOSCOPY;  Service: Cardiovascular;  Laterality: N/A;  . RIGHT HEART CATH N/A 09/29/2019   Procedure: RIGHT HEART CATH;  Surgeon: Larey Dresser, MD;  Location: [place] CV LAB;  Service: Cardiovascular;  Laterality: N/A;  . SHOULDER SURGERY     closed reduction  .  TEE WITHOUT CARDIOVERSION N/A 06/29/2017   Procedure: TRANSESOPHAGEAL ECHOCARDIOGRAM (TEE);  Surgeon: Larey Dresser, MD;  Location: [facility] ENDOSCOPY;  Service: Cardiovascular;  Laterality: N/A;  . TONSILLECTOMY    . TOTAL HIP ARTHROPLASTY Left 03/18/2013   Procedure: TOTAL HIP ARTHROPLASTY ANTERIOR APPROACH;  Surgeon: Mauri Pole, MD;  Location: [facility] ORS;  Service: Orthopedics;  Laterality: Left;  . TUBAL LIGATION       OB History   No obstetric history on file.     Family History  Problem Relation Age of Onset  . Lung cancer Mother   . Heart attack Father     Social History   Tobacco Use  . Smoking status: Never Smoker  . Smokeless tobacco: Never Used  Substance Use Topics  . Alcohol use: Yes    Alcohol/week: 3.0 standard drinks    Types: 3 Glasses of wine per week    Comment: per week  . Drug use: No    Home Medications Prior to Admission medications   Medication Sig Start Date End Date Taking? Authorizing Provider  acetaminophen (TYLENOL) 500 MG tablet Take 500 mg by mouth every 6 (six) hours as needed for headache.    [provider]  bisoprolol (ZEBETA) 5 MG tablet Take 0.5 mg by mouth daily.    [provider]  digoxin (LANOXIN) 0.125 MG tablet Take 0.5 tablets (0.0625 mg total) by mouth daily. 12/23/19   Larey Dresser, MD  famotidine (PEPCID) 20 MG tablet Take 1 tablet (20 mg total) by mouth daily. 05/08/19 01/12/20  Barb Merino, MD  fluticasone (FLONASE) 50 MCG/ACT nasal spray Place 1 spray into both nostrils daily as needed for allergies or rhinitis.     [provider]  furosemide (LASIX) 20 MG tablet Take 1 tablet (20 mg total) by mouth every other day. 12/23/19   Larey Dresser, MD  LORazepam (ATIVAN) 0.5 MG tablet Take 1 tablet (0.5 mg total) by mouth daily as needed (nausea). 10/09/19   Clegg, [name] D, NP  losartan (COZAAR) 25 MG tablet TAKE 1/2 TABLET BY MOUTH AT BEDTIME 06/17/19   Larey Dresser, MD  Rivaroxaban (XARELTO) 15 MG TABS  tablet Take 1 tablet (15 mg total) by mouth daily with supper. 12/11/19   Larey Dresser, MD  spironolactone (ALDACTONE) 25 MG tablet Take 0.5 tablets (12.5 mg total) by mouth at bedtime. 09/30/19   Clegg, [name] D, NP  traMADol (ULTRAM) 50 MG tablet Take 1 tablet (50 mg total) by mouth every 6 (six) hours as needed. 01/10/20   Wieters, Hallie C, PA-C  VITAMIN D PO Take by mouth.    [provider]  zolpidem (AMBIEN) 5 MG tablet Take 1 tablet (5 mg total) by mouth at bedtime. 09/20/17   Mariel Aloe, MD    Allergies    Sotalol, Alendronate sodium, Amiodarone, Ciprofloxacin, Compazine [prochlorperazine], Coreg [carvedilol], and Metoprolol  Review of Systems   Review of Systems  Constitutional: Negative for fever.  Respiratory: Negative for shortness of breath.   All other systems reviewed and are negative.   Physical Exam Updated Vital Signs BP (!) 103/59 (BP Location:  Right Arm)   Pulse 80   Temp 98.3 F (36.8 C) (Oral)   Resp 13   Ht 5\' 5"  (1.651 m)   Wt 56.7 kg   SpO2 100%   BMI 20.80 kg/m   Physical Exam Vitals and nursing note reviewed.  Constitutional:      General: She is not in acute distress.    Appearance: She is well-developed. She is ill-appearing.     Comments: Slow slightly unsteady gait with ambulation  HENT:     Head: Normocephalic and atraumatic.     Mouth/Throat:     Mouth: Mucous membranes are dry.  Eyes:     Conjunctiva/sclera: Conjunctivae normal.     Pupils: Pupils are equal, round, and reactive to light.  Cardiovascular:     Rate and Rhythm: Normal rate and regular rhythm.     Heart sounds: Normal heart sounds.  Pulmonary:     Effort: Pulmonary effort is normal. No respiratory distress.     Breath sounds: Normal breath sounds.  Abdominal:     General: There is no distension.     Palpations: Abdomen is soft.     Tenderness: There is no abdominal tenderness.  Musculoskeletal:        General: No deformity. Normal range of motion.      Cervical back: Normal range of motion and neck supple.  Skin:    General: Skin is warm and dry.  Neurological:     General: No focal deficit present.     Mental Status: She is alert and oriented to person, place, and time. Mental status is at baseline.     ED Results / Procedures / Treatments   Labs (all labs ordered are listed, but only abnormal results are displayed) Labs Reviewed  COMPREHENSIVE METABOLIC PANEL - Abnormal; Notable for the following components:      Result Value   Glucose, Bld 134 (*)    Creatinine, Ser 1.15 (*)    Total Bilirubin 1.8 (*)    GFR calc non Af Amer 44 (*)    GFR calc Af Amer 51 (*)    All other components within normal limits  CBC - Abnormal; Notable for the following components:   Hemoglobin 16.4 (*)    HCT 46.4 (*)    MCH 34.7 (*)    All other components within normal limits  URINALYSIS, ROUTINE W REFLEX MICROSCOPIC - Abnormal; Notable for the following components:   Color, Urine ORANGE (*)    APPearance CLOUDY (*)    Hgb urine dipstick TRACE (*)    Bilirubin Urine SMALL (*)    Ketones, ur 15 (*)    Protein, ur 100 (*)    Nitrite POSITIVE (*)    Leukocytes,Ua MODERATE (*)    All other components within normal limits  URINALYSIS, MICROSCOPIC (REFLEX) - Abnormal; Notable for the following components:   Bacteria, UA FEW (*)    All other components within normal limits  LIPASE, BLOOD  LACTIC ACID, PLASMA  LACTIC ACID, PLASMA  TROPONIN I (HIGH SENSITIVITY)    EKG None  Radiology No results found.  Procedures Procedures (including critical care time)  Medications Ordered in ED Medications  sodium chloride flush (NS) 0.9 % injection 3 mL (has no administration in time range)  sodium chloride 0.9 % bolus 500 mL (has no administration in time range)  LORazepam (ATIVAN) tablet 0.5 mg (has no administration in time range)  cefTRIAXone (ROCEPHIN) 1 g in sodium chloride 0.9 % 100 mL IVPB (has  no administration in time range)    acetaminophen (TYLENOL) tablet 1,000 mg (has no administration in time range)    ED Course  I have reviewed the triage vital signs and the nursing notes.  Pertinent labs & imaging results that were available during my care of the patient were reviewed by me and considered in my medical decision making (see chart for details).    MDM Rules/Calculators/A&P                      MDM  Screen complete  Delynn Olvera Im was evaluated in Emergency Department on 02/01/2020 for the symptoms described in the history of present illness. She was evaluated in the context of the global COVID-19 pandemic, which necessitated consideration that the patient might be at risk for infection with the SARS-CoV-2 virus that causes COVID-19. Institutional protocols and algorithms that pertain to the evaluation of patients at risk for COVID-19 are in a state of rapid change based on information released by regulatory bodies including the CDC and federal and state organizations. These policies and algorithms were followed during the patient's care in the ED.  Patient is presenting for evaluation of dehydration, nausea, weakness, and suspected UTI.  Work-up is suggestive of dehydration with concomitant UTI.  IV fluids initiated in the ED. Rocephin given for coverage of UTI.  Patient would likely improve with overnight observation for IV fluids and continued monitoring and work-up.  Hospitalist service Tamala Julian) is aware case and will evaluate for same.   Final Clinical Impression(s) / ED Diagnoses Final diagnoses:  Weakness  Urinary tract infection without hematuria, site unspecified  Nausea    Rx / DC Orders ED Discharge Orders    None       Valarie Merino, MD 02/01/20 1446

## 2020-02-01 NOTE — ED Notes (Signed)
Call son ,[name],  [phone]

## 2020-02-01 NOTE — ED Triage Notes (Addendum)
Pt states she was diagnosed with shingles on May 1st.  Reports she was initially constipated.  C/o nausea x 5 days. Had large BM 2 days ago and PCP started her on Miralax.  Reports diarrhea with bowel incontinence since taking Miralax last night.  Hypotensive.  Reports back pain @ location of shingles.

## 2020-02-02 DIAGNOSIS — Z8619 Personal history of other infectious and parasitic diseases: Secondary | ICD-10-CM

## 2020-02-02 LAB — BASIC METABOLIC PANEL
Anion gap: 11 (ref 5–15)
BUN: 17 mg/dL (ref 8–23)
CO2: 21 mmol/L — ABNORMAL LOW (ref 22–32)
Calcium: 8.6 mg/dL — ABNORMAL LOW (ref 8.9–10.3)
Chloride: 106 mmol/L (ref 98–111)
Creatinine, Ser: 1.07 mg/dL — ABNORMAL HIGH (ref 0.44–1.00)
GFR calc Af Amer: 56 mL/min — ABNORMAL LOW (ref >60)
GFR calc non Af Amer: 48 mL/min — ABNORMAL LOW (ref >60)
Glucose, Bld: 91 mg/dL (ref 70–99)
Potassium: 4.3 mmol/L (ref 3.5–5.1)
Sodium: 138 mmol/L (ref 135–145)

## 2020-02-02 LAB — LACTIC ACID, PLASMA: Lactic Acid, Venous: 1.4 mmol/L (ref 0.5–1.9)

## 2020-02-02 LAB — CBC
HCT: 41.1 % (ref 36.0–46.0)
Hemoglobin: 14.7 g/dL (ref 12.0–15.0)
MCH: 34.3 pg — ABNORMAL HIGH (ref 26.0–34.0)
MCHC: 35.8 g/dL (ref 30.0–36.0)
MCV: 95.8 fL (ref 80.0–100.0)
Platelets: 170 10*3/uL (ref 150–400)
RBC: 4.29 MIL/uL (ref 3.87–5.11)
RDW: 13.2 % (ref 11.5–15.5)
WBC: 6 10*3/uL (ref 4.0–10.5)
nRBC: 0 % (ref 0.0–0.2)

## 2020-02-02 MED ORDER — PSYLLIUM 95 % PO PACK
1.0000 | PACK | Freq: Every day | ORAL | Status: DC | PRN
  Filled 2020-02-02: qty 1

## 2020-02-02 MED ORDER — SENNOSIDES-DOCUSATE SODIUM 8.6-50 MG PO TABS: 2.0000 | ORAL_TABLET | Freq: Two times a day (BID) | ORAL | Status: DC | PRN

## 2020-02-02 NOTE — Discharge Instructions (Addendum)
Urinary Tract Infection, Adult A urinary tract infection (UTI) is an infection of any part of the urinary tract. The urinary tract includes:  The kidneys.  The ureters.  The bladder.  The urethra. These organs make, store, and get rid of pee (urine) in the body. What are the causes? This is caused by germs (bacteria) in your genital area. These germs grow and cause swelling (inflammation) of your urinary tract. What increases the risk? You are more likely to develop this condition if:  You have a small, thin tube (catheter) to drain pee.  You cannot control when you pee or poop (incontinence).  You are female, and: ? You use these methods to prevent pregnancy:  A medicine that kills sperm (spermicide).  A device that blocks sperm (diaphragm). ? You have low levels of a female hormone (estrogen). ? You are pregnant.  You have genes that add to your risk.  You are sexually active.  You take antibiotic medicines.  You have trouble peeing because of: ? A prostate that is bigger than normal, if you are female. ? A blockage in the part of your body that drains pee from the bladder (urethra). ? A kidney stone. ? A nerve condition that affects your bladder (neurogenic bladder). ? Not getting enough to drink. ? Not peeing often enough.  You have other conditions, such as: ? Diabetes. ? A weak disease-fighting system (immune system). ? Sickle cell disease. ? Gout. ? Injury of the spine. What are the signs or symptoms? Symptoms of this condition include:  Needing to pee right away (urgently).  Peeing often.  Peeing small amounts often.  Pain or burning when peeing.  Blood in the pee.  Pee that smells bad or not like normal.  Trouble peeing.  Pee that is cloudy.  Fluid coming from the vagina, if you are female.  Pain in the belly or lower back. Other symptoms include:  Throwing up (vomiting).  No urge to eat.  Feeling mixed up (confused).  Being tired  and grouchy (irritable).  A fever.  Watery poop (diarrhea). How is this treated? This condition may be treated with:  Antibiotic medicine.  Other medicines.  Drinking enough water. Follow these instructions at home:  Medicines  Take over-the-counter and prescription medicines only as told by your doctor.  If you were prescribed an antibiotic medicine, take it as told by your doctor. Do not stop taking it even if you start to feel better. General instructions  Make sure you: ? Pee until your bladder is empty. ? Do not hold pee for a long time. ? Empty your bladder after sex. ? Wipe from front to back after pooping if you are a female. Use each tissue one time when you wipe.  Drink enough fluid to keep your pee pale yellow.  Keep all follow-up visits as told by your doctor. This is important. Contact a doctor if:  You do not get better after 1-2 days.  Your symptoms go away and then come back. Get help right away if:  You have very bad back pain.  You have very bad pain in your lower belly.  You have a fever.  You are sick to your stomach (nauseous).  You are throwing up. Summary  A urinary tract infection (UTI) is an infection of any part of the urinary tract.  This condition is caused by germs in your genital area.  There are many risk factors for a UTI. These include having a small, thin   tube to drain pee and not being able to control when you pee or poop.  Treatment includes antibiotic medicines for germs.  Drink enough fluid to keep your pee pale yellow. This information is not intended to replace advice given to you by your health care provider. Make sure you discuss any questions you have with your health care provider. Document Revised: 08/15/2018 Document Reviewed: 03/07/2018 Elsevier Patient Education  2020 [place] on my medicine - XARELTO (Rivaroxaban)  This medication education was reviewed with me or my healthcare  representative as part of my discharge preparation.    Why was Xarelto prescribed for you? Xarelto was prescribed for you to reduce the risk of a blood clot forming that can cause a stroke if you have a medical condition called atrial fibrillation (a type of irregular heartbeat).  What do you need to know about xarelto ? Take your Xarelto ONCE DAILY at the same time every day with your evening meal. If you have difficulty swallowing the tablet whole, you may crush it and mix in applesauce just prior to taking your dose.  Take Xarelto exactly as prescribed by your doctor and DO NOT stop taking Xarelto without talking to the doctor who prescribed the medication.  Stopping without other stroke prevention medication to take the place of Xarelto may increase your risk of developing a clot that causes a stroke.  Refill your prescription before you run out.  After discharge, you should have regular check-up appointments with your healthcare provider that is prescribing your Xarelto.  In the future your dose may need to be changed if your kidney function or weight changes by a significant amount.  What do you do if you miss a dose? If you are taking Xarelto ONCE DAILY and you miss a dose, take it as soon as you remember on the same day then continue your regularly scheduled once daily regimen the next day. Do not take two doses of Xarelto at the same time or on the same day.   Important Safety Information A possible side effect of Xarelto is bleeding. You should call your healthcare provider right away if you experience any of the following: ? Bleeding from an injury or your nose that does not stop. ? Unusual colored urine (red or dark brown) or unusual colored stools (red or black). ? Unusual bruising for unknown reasons. ? A serious fall or if you hit your head (even if there is no bleeding).  Some medicines may interact with Xarelto and might increase your risk of bleeding while on  Xarelto. To help avoid this, consult your healthcare provider or pharmacist prior to using any new prescription or non-prescription medications, including herbals, vitamins, non-steroidal anti-inflammatory drugs (NSAIDs) and supplements.  This website has more information on Xarelto: [email or web address]-benson.com/.

## 2020-02-02 NOTE — Progress Notes (Signed)
OT Cancellation Note  Patient Details Name: Cheryl Vargas MRN: [id number] DOB: August 23, 1935   Cancelled Treatment:    Reason Eval/Treat Not Completed: Patient declined, no reason specified(Pt stating "he said I could start therapy tomorrow.")  Pt reporting that her pain in L side is finally controlled and plans to stay as she is, but will start therapy tomorrow.  Jefferey Pica, OTR/L Acute Rehabilitation Services Pager: [phone] Office: [phone]   Jefferey Pica 02/02/2020, 3:43 PM

## 2020-02-02 NOTE — Progress Notes (Signed)
PROGRESS NOTE    Cheryl Vargas  ERX:[id number] DOB: [birth year] DOA: 02/01/2020 PCP: Lavone Orn, MD    Chief Complaint  Patient presents with  . Diarrhea  . Nausea    Brief Narrative:  85 year old lady with prior history of persistent atrial fibrillation, chronic systolic heart failure last EF noted to be around 20%, s/p AICD, stage IIIa CKD, arthritis presents with worsening nausea and weakness for over a month.  Of note she has been diagnosed with shingles on May 1 has been treated for 7 days with Valtrex.  She was also recently treated for urinary tract infection and nitrofurantoin for about 4 days to ED she was found to be hypotensive and dehydrated and found to have urinary tract infection.   Assessment & Plan:   Principal Problem:   UTI (urinary tract infection) Active Problems:   Chronic combined systolic and diastolic CHF (congestive heart failure) (HCC)   A-fib (HCC)   Prolonged QT interval   Chronic anticoagulation   Transient hypotension   History of shingles   SIRS probably from UTI:  Follow up urine cultures. Resume rocephin.  Get PT/ot eval.  Hypotension resolved.     Recent diagnosis of shingles:  Completed the course of valtrex.     Chronic systolic and diastolic heart failure:  She appears to be dehydrated.  Holding all diuretics. Echocardiogram showed Left ventricular ejection fraction, by visual estimation, is <20%. The  left ventricle has severely decreased function. There is no left  ventricular hypertrophy.  The left ventricle demonstrates global hypokinesis. Global right ventricle has mildly reduced systolic function.The right ventricular size is mildly enlarged. No increase in right ventricular wall thickness.     Persistent atrial fibrillation  Rate controlled.  On xarelto for anti coagulation.  CHA2DS2-VASc 2 score is 3.  Last echocardiogram reviewed.     DVT prophylaxis: xarelto  Code Status:  Full code.  Family Communication:   None at bedside. Disposition:   Status is: Inpatient  Remains inpatient appropriate because:IV treatments appropriate due to intensity of illness or inability to take PO   Dispo: The patient is from: Home              Anticipated d/c is to: Pending              Anticipated d/c date is: 2 days              Patient currently is not medically stable to d/c.        Consultants:   None.    Procedures: none  Antimicrobials:  Anti-infectives (From admission, onward)   Start     Dose/Rate Route Frequency Ordered Stop   02/02/20 1400  cefTRIAXone (ROCEPHIN) 1 g in sodium chloride 0.9 % 100 mL IVPB     1 g 200 mL/hr over 30 Minutes Intravenous Every 24 hours 02/01/20 1633     02/01/20 1430  cefTRIAXone (ROCEPHIN) 1 g in sodium chloride 0.9 % 100 mL IVPB     1 g 200 mL/hr over 30 Minutes Intravenous  Once 02/01/20 1420 02/01/20 1537       Subjective: Reports feeling exhausted, weak, some back pain.   Objective: Vitals:   02/01/20 1837 02/01/20 1943 02/01/20 2140 02/02/20 0817  BP:  107/61  123/65  Pulse:  80 80 80  Resp:  18  19  Temp: 97.7 F (36.5 C) 98 F (36.7 C)  97.7 F (36.5 C)  TempSrc: Oral Oral    SpO2:  99%  100%  Weight:  58.6 kg    Height:        Intake/Output Summary (Last 24 hours) at 02/02/2020 1351 Last data filed at 02/01/2020 1839 Gross per 24 hour  Intake 347.63 ml  Output --  Net 347.63 ml   Filed Weights   02/01/20 1313 02/01/20 1943  Weight: 56.7 kg 58.6 kg    Examination:  General exam: Appears calm and comfortable  Respiratory system: Clear to auscultation. Respiratory effort normal. Cardiovascular system: S1 & S2 heard, RRR. No JVD, Gastrointestinal system: Abdomen is nondistended, soft and nonten no pedal edema der.. Normal bowel sounds heard. Central nervous system: Alert and oriented. No focal neurological deficits. Extremities: Symmetric 5 x 5 power. Skin: No rashes, lesions or ulcers Psychiatry:  Mood & affect  appropriate.     Data Reviewed: I have personally reviewed following labs and imaging studies  CBC: Recent Labs  Lab 02/01/20 1327 02/02/20 0306  WBC 7.0 6.0  HGB 16.4* 14.7  HCT 46.4* 41.1  MCV 98.1 95.8  PLT 213 329    Basic Metabolic Panel: Recent Labs  Lab 02/01/20 1327 02/02/20 0306  NA 136 138  K 4.6 4.3  CL 100 106  CO2 23 21*  GLUCOSE 134* 91  BUN 19 17  CREATININE 1.15* 1.07*  CALCIUM 9.2 8.6*    GFR: Estimated Creatinine Clearance: 36.5 mL/min (A) (by C-G formula based on SCr of 1.07 mg/dL (H)).  Liver Function Tests: Recent Labs  Lab 02/01/20 1327  AST 30  ALT 24  ALKPHOS 51  BILITOT 1.8*  PROT 6.8  ALBUMIN 3.5    CBG: No results for input(s): GLUCAP in the last 168 hours.   Recent Results (from the past 240 hour(s))  SARS Coronavirus 2 by RT PCR (hospital order, performed in [facility] hospital lab) Nasopharyngeal Nasopharyngeal Swab     Status: None   Collection Time: 02/01/20  2:31 PM   Specimen: Nasopharyngeal Swab  Result Value Ref Range Status   SARS Coronavirus 2 NEGATIVE NEGATIVE Final    Comment: (NOTE) SARS-CoV-2 target nucleic acids are NOT DETECTED. The SARS-CoV-2 RNA is generally detectable in upper and lower respiratory specimens during the acute phase of infection. The lowest concentration of SARS-CoV-2 viral copies this assay can detect is 250 copies / mL. A negative result does not preclude SARS-CoV-2 infection and should not be used as the sole basis for treatment or other patient management decisions.  A negative result may occur with improper specimen collection / handling, submission of specimen other than nasopharyngeal swab, presence of viral mutation(s) within the areas targeted by this assay, and inadequate number of viral copies (<250 copies / mL). A negative result must be combined with clinical observations, patient history, and epidemiological information. Fact Sheet for Patients:     StrictlyIdeas.no Fact Sheet for Healthcare Providers: BankingDealers.co.za This test is not yet approved or cleared  by the [place] FDA and has been authorized for detection and/or diagnosis of SARS-CoV-2 by FDA under an Emergency Use Authorization (EUA).  This EUA will remain in effect (meaning this test can be used) for the duration of the COVID-19 declaration under Section 564(b)(1) of the Act, 21 U.S.C. section 360bbb-3(b)(1), unless the authorization is terminated or revoked sooner. Performed at [facility], [place] [address] [place], [place] [address]          Radiology Studies: DG Chest Port 1 View  Result Date: 02/01/2020 CLINICAL DATA:  Shortness of breath EXAM: PORTABLE CHEST  1 VIEW COMPARISON:  September 27, 2019 FINDINGS: Stable pacemaker and cardiomegaly. The hila and mediastinum are normal. No pneumothorax. No pulmonary nodules or masses. No focal infiltrates. IMPRESSION: No active disease. Electronically Signed   By: Dorise Bullion III M.D   On: 02/01/2020 14:30        Scheduled Meds: . digoxin  0.0625 mg Oral Daily  . Rivaroxaban  15 mg Oral Q supper  . sodium chloride flush  3 mL Intravenous Q12H  . zolpidem  5 mg Oral QHS   Continuous Infusions: . cefTRIAXone (ROCEPHIN)  IV 1 g (02/02/20 1337)     LOS: 1 day       Hosie Poisson, MD [facility]   To contact the attending provider between 7A-7P or the covering provider during after hours 7P-7A, please log into the web site [email or web address] and access using universal [place] password for that web site. If you do not have the password, please call the hospital operator.  02/02/2020, 1:51 PM

## 2020-02-03 ENCOUNTER — Inpatient Hospital Stay (HOSPITAL_COMMUNITY): Payer: PPO

## 2020-02-03 DIAGNOSIS — Z515 Encounter for palliative care: Secondary | ICD-10-CM

## 2020-02-03 DIAGNOSIS — R531 Weakness: Secondary | ICD-10-CM

## 2020-02-03 DIAGNOSIS — Z7189 Other specified counseling: Secondary | ICD-10-CM

## 2020-02-03 DIAGNOSIS — E440 Moderate protein-calorie malnutrition: Secondary | ICD-10-CM | POA: Insufficient documentation

## 2020-02-03 LAB — COMPREHENSIVE METABOLIC PANEL
ALT: 18 U/L (ref 0–44)
AST: 20 U/L (ref 15–41)
Albumin: 3.2 g/dL — ABNORMAL LOW (ref 3.5–5.0)
Alkaline Phosphatase: 46 U/L (ref 38–126)
Anion gap: 9 (ref 5–15)
BUN: 16 mg/dL (ref 8–23)
CO2: 21 mmol/L — ABNORMAL LOW (ref 22–32)
Calcium: 8.8 mg/dL — ABNORMAL LOW (ref 8.9–10.3)
Chloride: 105 mmol/L (ref 98–111)
Creatinine, Ser: 0.93 mg/dL (ref 0.44–1.00)
GFR calc Af Amer: >60 mL/min (ref >60)
GFR calc non Af Amer: 57 mL/min — ABNORMAL LOW (ref >60)
Glucose, Bld: 114 mg/dL — ABNORMAL HIGH (ref 70–99)
Potassium: 4 mmol/L (ref 3.5–5.1)
Sodium: 135 mmol/L (ref 135–145)
Total Bilirubin: 0.6 mg/dL (ref 0.3–1.2)
Total Protein: 6.3 g/dL — ABNORMAL LOW (ref 6.5–8.1)

## 2020-02-03 MED ORDER — ONDANSETRON HCL 4 MG/2ML IJ SOLN
4.0000 mg | Freq: Four times a day (QID) | INTRAMUSCULAR | Status: DC | PRN
  Administered 2020-02-03: 4 mg via INTRAVENOUS
  Filled 2020-02-03 (×2): qty 2

## 2020-02-03 MED ORDER — SODIUM CHLORIDE 0.9 % IV SOLN: INTRAVENOUS | Status: AC

## 2020-02-03 MED ORDER — ENSURE ENLIVE PO LIQD
237.0000 mL | Freq: Two times a day (BID) | ORAL | Status: DC
  Administered 2020-02-04 (×2): 237 mL via ORAL

## 2020-02-03 MED ORDER — TRAMADOL HCL 50 MG PO TABS
50.0000 mg | ORAL_TABLET | Freq: Four times a day (QID) | ORAL | Status: DC | PRN
  Administered 2020-02-04 – 2020-02-05 (×3): 50 mg via ORAL
  Filled 2020-02-03 (×2): qty 1
  Filled 2020-02-03: qty 2

## 2020-02-03 MED ORDER — LORAZEPAM 0.5 MG PO TABS
0.5000 mg | ORAL_TABLET | Freq: Four times a day (QID) | ORAL | Status: DC | PRN
  Administered 2020-02-04 – 2020-02-06 (×5): 0.5 mg via ORAL
  Filled 2020-02-03 (×5): qty 1

## 2020-02-03 MED ORDER — IOHEXOL 300 MG/ML  SOLN
100.0000 mL | Freq: Once | INTRAMUSCULAR | Status: AC | PRN
  Administered 2020-02-03: 100 mL via INTRAVENOUS

## 2020-02-03 MED ORDER — MORPHINE SULFATE (PF) 2 MG/ML IV SOLN
0.5000 mg | INTRAVENOUS | Status: DC | PRN
  Administered 2020-02-04 – 2020-02-06 (×3): 0.5 mg via INTRAVENOUS
  Filled 2020-02-03 (×3): qty 1

## 2020-02-03 NOTE — Progress Notes (Signed)
PROGRESS NOTE    Cheryl Vargas  ZOX:[id number] DOB: 21-Jun-1935 DOA: 02/01/2020 PCP: Lavone Orn, MD    Chief Complaint  Patient presents with  . Diarrhea  . Nausea    Brief Narrative:  85 year old lady with prior history of persistent atrial fibrillation, chronic systolic heart failure last EF noted to be around 20%, s/p AICD, stage IIIa CKD, arthritis presents with worsening nausea and weakness for over a month.  Of note she has been diagnosed with shingles on May 1 has been treated for 7 days with Valtrex.  She was also recently treated for urinary tract infection and nitrofurantoin for about 4 days presents to ED fir generalized weakness , nausea.  she was found to be hypotensive, dehydrated and has a  urinary tract infection. This am, pt reports being nauseated, has abdominal pain, had one loose BM this am.  Back pain is intermittent, still has dysuria.    Assessment & Plan:   Principal Problem:   UTI (urinary tract infection) Active Problems:   Chronic combined systolic and diastolic CHF (congestive heart failure) (HCC)   A-fib (HCC)   Prolonged QT interval   Chronic anticoagulation   Transient hypotension   History of shingles   SIRS probably from UTI:  Urine cultures show E COLI, further sensitivities to follow.  Resume rocephin.  Start the patient on gentle hydration for 12 hours as she appears dehydrated.     Recent diagnosis of shingles:  Completed the course of valtrex. Pain control.     Chronic systolic and diastolic heart failure:  She appears to be dehydrated.  Holding all diuretics. Echocardiogram from 1/ 2021 showed Left ventricular ejection fraction, by visual estimation, is <20%. The  left ventricle has severely decreased function. There is no left  ventricular hypertrophy.  The left ventricle demonstrates global hypokinesis. Global right ventricle has mildly reduced systolic function.The right ventricular size is mildly enlarged. No increase in  right ventricular wall thickness. S/p AICD.  QTC is 0.35    Persistent atrial fibrillation  Rate controlled on Digoxin.  On xarelto for anti coagulation.  CHA2DS2-VASc 2 score is 3.  Last echocardiogram reviewed.    Nausea, abdominal pain and loose BM this am.  Hold senna, colace and psyllium.  Added zofran and CT abd and pelvis ordered to evaluate for diverticulitis.     Hypotension:  Check orthostatic vital signs in am.    Due to multiple medical issues, poor functional status, deconditioning, palliative care consulted for goals of care, PT/OT EVAL and dietary consult.  Pt wishes not to be on lasix or spironolactone and reports generalized weakness with lasix.    DVT prophylaxis: xarelto  Code Status:  Full code.  Family Communication:  None at bedside.she said she will call her son and update him.  Disposition:   Status is: Inpatient  Remains inpatient appropriate because:IV treatments appropriate due to intensity of illness or inability to take PO,    Dispo: The patient is from: Home              Anticipated d/c is to: Pending              Anticipated d/c date is: 2 days              Patient currently is not medically stable to d/c.        Consultants:   None.    Procedures: none  Antimicrobials:  Anti-infectives (From admission, onward)   Start  Dose/Rate Route Frequency Ordered Stop   02/02/20 1400  cefTRIAXone (ROCEPHIN) 1 g in sodium chloride 0.9 % 100 mL IVPB     1 g 200 mL/hr over 30 Minutes Intravenous Every 24 hours 02/01/20 1633     02/01/20 1430  cefTRIAXone (ROCEPHIN) 1 g in sodium chloride 0.9 % 100 mL IVPB     1 g 200 mL/hr over 30 Minutes Intravenous  Once 02/01/20 1420 02/01/20 1537       Subjective: pt reports being nauseated, has abdominal pain, had one loose BM this am.  Back pain is intermittent, still has dysuria.   Objective: Vitals:   02/02/20 0817 02/02/20 1547 02/02/20 2336 02/03/20 0808  BP: 123/65 (!) 103/51 (!)  98/50 (!) 99/53  Pulse: 80 80 79 81  Resp: 19 19  17   Temp: 97.7 F (36.5 C) 97.6 F (36.4 C) 97.8 F (36.6 C) 97.7 F (36.5 C)  TempSrc:    Oral  SpO2: 100% 100% 99% 95%  Weight:      Height:        Intake/Output Summary (Last 24 hours) at 02/03/2020 1110 Last data filed at 02/03/2020 0800 Gross per 24 hour  Intake 200 ml  Output --  Net 200 ml   Filed Weights   02/01/20 1313 02/01/20 1943  Weight: 56.7 kg 58.6 kg    Examination:  General exam: Chronically ill-appearing lady not in any kind of distress Respiratory system: Clear to auscultation bilaterally, no rhonchi no tachypnea, no wheezing Cardiovascular system: S1-S2 heard, regular rate rhythm, no JVD, no pedal edema Gastrointestinal system: Abdomen is soft, mild lower quadrant tenderness, bowel sounds normal nondistended abdomen. Central nervous system: Alert and oriented, grossly nonfocal Extremities: No pedal edema, cyanosis or clubbing Skin: No rashes or ulcers seen Psychiatry: Mood is appropriate    Data Reviewed: I have personally reviewed following labs and imaging studies  CBC: Recent Labs  Lab 02/01/20 1327 02/02/20 0306  WBC 7.0 6.0  HGB 16.4* 14.7  HCT 46.4* 41.1  MCV 98.1 95.8  PLT 213 096    Basic Metabolic Panel: Recent Labs  Lab 02/01/20 1327 02/02/20 0306  NA 136 138  K 4.6 4.3  CL 100 106  CO2 23 21*  GLUCOSE 134* 91  BUN 19 17  CREATININE 1.15* 1.07*  CALCIUM 9.2 8.6*    GFR: Estimated Creatinine Clearance: 36.5 mL/min (A) (by C-G formula based on SCr of 1.07 mg/dL (H)).  Liver Function Tests: Recent Labs  Lab 02/01/20 1327  AST 30  ALT 24  ALKPHOS 51  BILITOT 1.8*  PROT 6.8  ALBUMIN 3.5    CBG: No results for input(s): GLUCAP in the last 168 hours.   Recent Results (from the past 240 hour(s))  SARS Coronavirus 2 by RT PCR (hospital order, performed in [facility] hospital lab) Nasopharyngeal Nasopharyngeal Swab     Status: None   Collection Time: 02/01/20   2:31 PM   Specimen: Nasopharyngeal Swab  Result Value Ref Range Status   SARS Coronavirus 2 NEGATIVE NEGATIVE Final    Comment: (NOTE) SARS-CoV-2 target nucleic acids are NOT DETECTED. The SARS-CoV-2 RNA is generally detectable in upper and lower respiratory specimens during the acute phase of infection. The lowest concentration of SARS-CoV-2 viral copies this assay can detect is 250 copies / mL. A negative result does not preclude SARS-CoV-2 infection and should not be used as the sole basis for treatment or other patient management decisions.  A negative result may occur with improper  specimen collection / handling, submission of specimen other than nasopharyngeal swab, presence of viral mutation(s) within the areas targeted by this assay, and inadequate number of viral copies (<250 copies / mL). A negative result must be combined with clinical observations, patient history, and epidemiological information. Fact Sheet for Patients:   StrictlyIdeas.no Fact Sheet for Healthcare Providers: BankingDealers.co.za This test is not yet approved or cleared  by the [place] FDA and has been authorized for detection and/or diagnosis of SARS-CoV-2 by FDA under an Emergency Use Authorization (EUA).  This EUA will remain in effect (meaning this test can be used) for the duration of the COVID-19 declaration under Section 564(b)(1) of the Act, 21 U.S.C. section 360bbb-3(b)(1), unless the authorization is terminated or revoked sooner. Performed at [facility], [place] [address] [place], [place] [address]   Culture, Urine     Status: Abnormal (Preliminary result)   Collection Time: 02/01/20  3:21 PM   Specimen: Urine, Random  Result Value Ref Range Status   Specimen Description URINE, RANDOM  Final   Special Requests NONE  Final   Culture (A)  Final    >=100,000 COLONIES/mL ESCHERICHIA COLI SUSCEPTIBILITIES TO FOLLOW Performed at [facility], [place] [address] [place], [place] [address]    Report Status PENDING  Incomplete         Radiology Studies: DG Chest Port 1 View  Result Date: 02/01/2020 CLINICAL DATA:  Shortness of breath EXAM: PORTABLE CHEST 1 VIEW COMPARISON:  September 27, 2019 FINDINGS: Stable pacemaker and cardiomegaly. The hila and mediastinum are normal. No pneumothorax. No pulmonary nodules or masses. No focal infiltrates. IMPRESSION: No active disease. Electronically Signed   By: Dorise Bullion III M.D   On: 02/01/2020 14:30        Scheduled Meds: . digoxin  0.0625 mg Oral Daily  . Rivaroxaban  15 mg Oral Q supper  . sodium chloride flush  3 mL Intravenous Q12H  . zolpidem  5 mg Oral QHS   Continuous Infusions: . sodium chloride    . cefTRIAXone (ROCEPHIN)  IV 1 g (02/02/20 1337)     LOS: 2 days       Hosie Poisson, MD [facility]   To contact the attending provider between 7A-7P or the covering provider during after hours 7P-7A, please log into the web site [email or web address] and access using universal [place] password for that web site. If you do not have the password, please call the hospital operator.  02/03/2020, 11:10 AM

## 2020-02-03 NOTE — Progress Notes (Signed)
CSW attempted to see patient to complete assessment; first attempt, RN was in room and second attempt patient was off the unit for testing. CSW to continue to follow to discuss discharge plans with patient.  Laveda Abbe, [place] Clinical Social Worker [phone]

## 2020-02-03 NOTE — Evaluation (Signed)
Occupational Therapy Evaluation Patient Details Name: Cheryl Vargas MRN: [id number] DOB: July 10, 1935 Today's Date: 02/03/2020    History of Present Illness Pt is an 85 year old woman admitted from home with nausea, weakness and falls, + UTI, hypotension and dehydration. PMH: recent shingels, afib, CHF with EF of 20%, s/p AICD, CKDIII, arthritis.    Clinical Impression   Pt is independent at her baseline, but reports decline in ability to care for herself and her home over the last several weeks. Pt presents with generalized weakness and impaired standing balance. She requires set up to min assist for ADL and min A for OOB mobility. Recommending ST rehab in SNF prior to return home. Pt is in agreement with this plan. Will follow acutely.    Follow Up Recommendations  SNF;Supervision/Assistance - 24 hour    Equipment Recommendations  Other (comment)(defer to next venue)    Recommendations for Other Services       Precautions / Restrictions Precautions Precautions: Fall      Mobility Bed Mobility Overal bed mobility: Modified Independent             General bed mobility comments: increased time, HOB flat, no physical assist  Transfers Overall transfer level: Needs assistance   Transfers: Sit to/from Stand Sit to Stand: Min guard         General transfer comment: reaches to stabilize on table upon standing    Balance Overall balance assessment: Needs assistance   Sitting balance-Leahy Scale: Normal       Standing balance-Leahy Scale: Fair Standing balance comment: statically                           ADL either performed or assessed with clinical judgement   ADL Overall ADL's : Needs assistance/impaired Eating/Feeding: Independent   Grooming: Minimal assistance;Standing   Upper Body Bathing: Set up;Sitting   Lower Body Bathing: Min guard;Sit to/from stand   Upper Body Dressing : Set up;Sitting   Lower Body Dressing: Sit to/from  stand;Min guard   Toilet Transfer: Minimal assistance;Ambulation   Toileting- Clothing Manipulation and Hygiene: Min guard;Sit to/from stand       Functional mobility during ADLs: Minimal assistance       Vision Baseline Vision/History: Wears glasses Wears Glasses: At all times;Reading only Patient Visual Report: No change from baseline       Perception     Praxis      Pertinent Vitals/Pain Pain Assessment: No/denies pain     Hand Dominance Right   Extremity/Trunk Assessment Upper Extremity Assessment Upper Extremity Assessment: Generalized weakness;Overall [facility] for tasks assessed   Lower Extremity Assessment Lower Extremity Assessment: Defer to PT evaluation   Cervical / Trunk Assessment Cervical / Trunk Assessment: Normal   Communication Communication Communication: No difficulties   Cognition Arousal/Alertness: Awake/alert Behavior During Therapy: Anxious Overall Cognitive Status: Within Functional Limits for tasks assessed                                 General Comments: pt with nausea upon arrival and stating she felt like she was going to die, became more relaxed and nausea subsided as session continued, pt had also received Ativan    General Comments       Exercises     Shoulder Instructions      Home Living Family/patient expects to be discharged to:: Private residence Living Arrangements: Alone  Available Help at Discharge: Family;Available PRN/intermittently Type of Home: Apartment Home Access: Level entry     Home Layout: One level     Bathroom Shower/Tub: Occupational psychologist: Handicapped height     Home Equipment: Shower seat;Hand held Tourist information centre manager - 4 wheels;Toilet riser;Grab bars - toilet          Prior Functioning/Environment Level of Independence: Independent        Comments: pt with decline in ability to care for herself and her home, had a cleaning person prior but she is out having had  surgery recently        OT Problem List: Decreased strength;Decreased activity tolerance;Impaired balance (sitting and/or standing);Decreased knowledge of use of DME or AE;Cardiopulmonary status limiting activity      OT Treatment/Interventions: Self-care/ADL training;DME and/or AE instruction;Therapeutic activities;Patient/family education;Balance training;Energy conservation    OT Goals(Current goals can be found in the care plan section) Acute Rehab OT Goals Patient Stated Goal: get stronger so she can manage at home independently OT Goal Formulation: With patient Time For Goal Achievement: 02/17/20 Potential to Achieve Goals: Good ADL Goals Pt Will Perform Grooming: with supervision;standing(3 activities) Pt Will Perform Lower Body Bathing: with supervision;sit to/from stand Pt Will Perform Lower Body Dressing: with supervision;sit to/from stand Pt Will Transfer to Toilet: with supervision;ambulating Pt Will Perform Toileting - Clothing Manipulation and hygiene: with supervision;sit to/from stand Additional ADL Goal #1: Pt will utilize energy conservation strategies in ADL and mobility independently.  OT Frequency: Min 2X/week   Barriers to D/C: Decreased caregiver support          Co-evaluation              AM-PAC OT "6 Clicks" Daily Activity     Outcome Measure Help from another person eating meals?: None Help from another person taking care of personal grooming?: A Little Help from another person toileting, which includes using toliet, bedpan, or urinal?: A Little Help from another person bathing (including washing, rinsing, drying)?: A Little Help from another person to put on and taking off regular upper body clothing?: None Help from another person to put on and taking off regular lower body clothing?: A Little 6 Click Score: 20   End of Session    Activity Tolerance: Patient tolerated treatment well Patient left: in bed;with call bell/phone within  reach  OT Visit Diagnosis: Other abnormalities of gait and mobility (R26.89);Muscle weakness (generalized) (M62.81);History of falling (Z91.81)                Time: 5993-5701 OT Time Calculation (min): 29 min Charges:  OT General Charges $OT Visit: 1 Visit OT Evaluation $OT Eval Moderate Complexity: 1 Mod  Nestor Lewandowsky, OTR/L Acute Rehabilitation Services Pager: [phone] Office: [phone]  Malka So 02/03/2020, 12:26 PM

## 2020-02-03 NOTE — Evaluation (Signed)
Physical Therapy Evaluation Patient Details Name: Cheryl Vargas MRN: [id number] DOB: 06/13/1935 Today's Date: 02/03/2020   History of Present Illness  Pt is an 85 year old woman admitted from home with nausea, weakness and falls, + UTI, hypotension and dehydration. PMH: recent shingels, afib, CHF with EF of 20%, s/p AICD, CKDIII, arthritis.   Clinical Impression  Pt admitted with/for nausea, weakness and falls.  She was initially too anxious and nauseous to participate, but as we talked, she relaxed and was able to move on a limited basis with min guard to min assist.  Pt currently limited functionally due to the problems listed. ( See problems list.)   Pt will benefit from PT to maximize function and safety in order to get ready for next venue listed below.      Follow Up Recommendations SNF;Supervision/Assistance - 24 hour    Equipment Recommendations  None recommended by PT    Recommendations for Other Services       Precautions / Restrictions Precautions Precautions: Fall      Mobility  Bed Mobility Overal bed mobility: Modified Independent             General bed mobility comments: increased time, HOB flat, no physical assist  Transfers Overall transfer level: Needs assistance   Transfers: Sit to/from Stand Sit to Stand: Min guard         General transfer comment: reaches to stabilize on table upon standing  Ambulation/Gait             General Gait Details: took pivotal steps in front of the bed to simulate moving to [facility] or stepping up toward [facility] with min HHA stabiltiy  Stairs            Wheelchair Mobility    Modified Rankin (Stroke Patients Only)       Balance Overall balance assessment: Needs assistance Sitting-balance support: Single extremity supported;No upper extremity supported Sitting balance-Leahy Scale: Normal     Standing balance support: Single extremity supported;No upper extremity supported Standing balance-Leahy  Scale: Fair Standing balance comment: statically                             Pertinent Vitals/Pain Pain Assessment: No/denies pain    Home Living Family/patient expects to be discharged to:: Private residence Living Arrangements: Alone Available Help at Discharge: Family;Available PRN/intermittently Type of Home: Apartment Home Access: Level entry     Home Layout: One level Home Equipment: Shower seat;Hand held Tourist information centre manager - 4 wheels;Toilet riser;Grab bars - toilet      Prior Function Level of Independence: Independent         Comments: pt with decline in ability to care for herself and her home, had a cleaning person prior but she is out having had surgery recently     Hand Dominance   Dominant Hand: Right    Extremity/Trunk Assessment   Upper Extremity Assessment Upper Extremity Assessment: Generalized weakness    Lower Extremity Assessment Lower Extremity Assessment: Defer to PT evaluation;Generalized weakness    Cervical / Trunk Assessment Cervical / Trunk Assessment: Normal  Communication   Communication: No difficulties  Cognition Arousal/Alertness: Awake/alert Behavior During Therapy: Anxious Overall Cognitive Status: Within Functional Limits for tasks assessed                                 General Comments: pt with  nausea upon arrival and stating she felt like she was going to die, became more relaxed and nausea subsided as session continued, pt had also received Ativan       General Comments      Exercises     Assessment/Plan    PT Assessment Patient needs continued PT services  PT Problem List Decreased strength;Decreased activity tolerance;Decreased balance;Decreased mobility;Cardiopulmonary status limiting activity       PT Treatment Interventions Gait training;Functional mobility training;Therapeutic activities;Balance training;Patient/family education;DME instruction    PT Goals (Current goals can be  found in the Care Plan section)  Acute Rehab PT Goals Patient Stated Goal: get stronger so she can manage at home independently PT Goal Formulation: With patient Time For Goal Achievement: 02/10/20 Potential to Achieve Goals: Good    Frequency Min 3X/week   Barriers to discharge        Co-evaluation               AM-PAC PT "6 Clicks" Mobility  Outcome Measure Help needed turning from your back to your side while in a flat bed without using bedrails?: None Help needed moving from lying on your back to sitting on the side of a flat bed without using bedrails?: None Help needed moving to and from a bed to a chair (including a wheelchair)?: A Little Help needed standing up from a chair using your arms (e.g., wheelchair or bedside chair)?: A Little Help needed to walk in hospital room?: A Little Help needed climbing 3-5 steps with a railing? : A Little 6 Click Score: 20    End of Session   Activity Tolerance: Patient tolerated treatment well;Other (comment)(limited by nausea and ?anxiety) Patient left: in bed;with call bell/phone within reach Nurse Communication: Mobility status PT Visit Diagnosis: Unsteadiness on feet (R26.81);Other abnormalities of gait and mobility (R26.89);Difficulty in walking, not elsewhere classified (R26.2)    Time: 2505-3976 PT Time Calculation (min) (ACUTE ONLY): 29 min   Charges:   PT Evaluation $PT Eval Moderate Complexity: 1 Mod          02/03/2020  Ginger Carne., PT Acute Rehabilitation Services [phone]  (pager) [phone]  (office)  Tessie Fass Loida Calamia 02/03/2020, 2:15 PM

## 2020-02-03 NOTE — Consult Note (Signed)
Consultation Note Date: 02/03/2020   Patient Name: Cheryl Vargas  DOB: 1935/05/22  MRN: [id number]  Age / Sex: 85 y.o., female  PCP: Lavone Orn, MD Referring Physician: Hosie Poisson, MD  Reason for Consultation: Establishing goals of care  HPI/Patient Profile: 85 y.o. female  with past medical history of systolic HF with EF 48%, AICD placement in November 17, 2016, chronic kidney disease stage III, and Atrial fibrillation admitted on 02/01/2020 with UTI and hypotension. She presented to the ED with complaint of weakness, nausea, and dehydration progressively worsening over the past week.   Clinical Assessment and Goals of Care: I have reviewed medical records including EPIC notes, labs and imaging, examined the patient and met at bedside with patient  to discuss diagnosis, prognosis, GOC, disposition, and options.  I introduced Palliative Medicine as specialized medical care for people living with serious illness. It focuses on providing relief from the symptoms and stress of a serious illness.   We discussed a brief social history and functional status of the patient. Her husband died in Nov 17, 2013 of lung cancer and was receiving hospice care. She now lives alone in a 14th floor apartment in [place]. Prior to the several weeks before admission, she has been fully independent. She now expresses concerns about returning home in her weakened state. She has 3 children; 2 of them ([name] and Tim) live locally in [place] and are supportive but stay "busy with work". The other son [name] lives in [place] and is her legal HCPOA.   We discussed her current illness and what it means in the larger context of her ongoing co-morbidities.  Natural disease trajectory of heart failure was discussed.   I attempted to elicit values and goals of care important to the patient. She states it is important for her to remain in her home for as  long as possible. She hopes to regain her former level of functioning by going through a short stay of rehab.    Advanced directives, concepts specific to code status, artifical feeding and hydration, and rehospitalization were considered and discussed. MOST form was introduced.   I addressed the issue of CODE STATUS with the patient. I emphasized that if her heart stopped and/or she stopped breathing, that "full code" interventions would not help and were likely to cause additional pain and suffering. Patient reports that she "coded twice" in 11-17-2016 and survived, and declines DNR status at this time. She expresses the desire of wanting more time to see her grandchildren grow. Patient does state she would only want to be on a ventilator if it were short-term and for a reversible condition. She states she would never want a tracheostomy.   Hospice and Palliative Care services outpatient were explained and offered. Patient is open to receiving outpatient palliative care and even hospice care in the future as her heart failure progresses.   Questions and concerns were addressed. The patient was encouraged to call with questions or concerns.   Primary decision maker: patient   SUMMARY OF RECOMMENDATIONS   -  full code full scope  - TOC order placed for outpatient palliative care - plan to complete MOST form 5/26  Code Status/Advance Care Planning:  Full code  Symptom Management:   Continue ativan and zofran prn for nausea  Palliative Prophylaxis:   Bowel Regimen, Delirium Protocol and Frequent Pain Assessment  Additional Recommendations (Limitations, Scope, Preferences):  Full Scope Treatment  Psycho-social/Spiritual:   Desire for further Chaplaincy support: not asked  Prognosis:   Unable to determine  Discharge Planning: To Be Determined      Primary Diagnoses: Present on Admission: . UTI (urinary tract infection) . Prolonged QT interval . A-fib ([place]) . (Resolved) Systolic  and diastolic CHF, acute ([place]) . Chronic combined systolic and diastolic CHF (congestive heart failure) ([place]) . Transient hypotension   I have reviewed the medical record, interviewed the patient and family, and examined the patient. The following aspects are pertinent.  Past Medical History:  Diagnosis Date  . Arthritis   . Chronic combined systolic (congestive) and diastolic (congestive) heart failure ([place])   . Mitral regurgitation   . Non-ischemic cardiomyopathy ([place])   . Persistent atrial fibrillation ([place])   . Ventricular tachycardia (HCC)    Social History   Socioeconomic History  . Marital status: Widowed    Spouse name: Not on file  . Number of children: Not on file  . Years of education: Not on file  . Highest education level: Not on file  Occupational History  . Not on file  Tobacco Use  . Smoking status: Never Smoker  . Smokeless tobacco: Never Used  Substance and Sexual Activity  . Alcohol use: Yes    Alcohol/week: 3.0 standard drinks    Types: 3 Glasses of wine per week    Comment: per week  . Drug use: No  . Sexual activity: Not on file  Other Topics Concern  . Not on file  Social History Narrative  . Not on file    Family History  Problem Relation Age of Onset  . Lung cancer Mother   . Heart attack Father    Scheduled Meds: . digoxin  0.0625 mg Oral Daily  . Rivaroxaban  15 mg Oral Q supper  . sodium chloride flush  3 mL Intravenous Q12H  . zolpidem  5 mg Oral QHS   Continuous Infusions: . sodium chloride 50 mL/hr at 02/03/20 1135  . cefTRIAXone (ROCEPHIN)  IV 1 g (02/03/20 1257)   PRN Meds:.acetaminophen **OR** acetaminophen, albuterol, famotidine, fluticasone, LORazepam, ondansetron (ZOFRAN) IV, psyllium, senna-docusate, traMADol  Medications Prior to Admission:  Prior to Admission medications   Medication Sig Start Date End Date Taking? Authorizing Provider  acetaminophen (TYLENOL) 500 MG tablet Take 500 mg by mouth every 6 (six) hours  as needed for headache.   Yes [provider]  bisoprolol (ZEBETA) 5 MG tablet Take 0.5 mg by mouth daily.   Yes [provider]  digoxin (LANOXIN) 0.125 MG tablet Take 0.5 tablets (0.0625 mg total) by mouth daily. 12/23/19  Yes Larey Dresser, MD  famotidine (PEPCID) 20 MG tablet Take 1 tablet (20 mg total) by mouth daily. Patient taking differently: Take 20 mg by mouth daily as needed for heartburn.  05/08/19 02/01/20 Yes Ghimire, Dante Gang, MD  fluticasone (FLONASE) 50 MCG/ACT nasal spray Place 1 spray into both nostrils daily as needed for allergies or rhinitis.    Yes [provider]  furosemide (LASIX) 20 MG tablet Take 1 tablet (20 mg total) by mouth every other day. Patient taking  differently: Take 20 mg by mouth every other day as needed for fluid.  12/23/19  Yes Larey Dresser, MD  LORazepam (ATIVAN) 0.5 MG tablet Take 1 tablet (0.5 mg total) by mouth daily as needed (nausea). 10/09/19  Yes Clegg, [name] D, NP  losartan (COZAAR) 25 MG tablet TAKE 1/2 TABLET BY MOUTH AT BEDTIME Patient taking differently: Take 12.5 mg by mouth daily.  06/17/19  Yes Larey Dresser, MD  Rivaroxaban (XARELTO) 15 MG TABS tablet Take 1 tablet (15 mg total) by mouth daily with supper. 12/11/19  Yes Larey Dresser, MD  spironolactone (ALDACTONE) 25 MG tablet Take 0.5 tablets (12.5 mg total) by mouth at bedtime. 09/30/19  Yes Clegg, [name] D, NP  traMADol (ULTRAM) 50 MG tablet Take 1 tablet (50 mg total) by mouth every 6 (six) hours as needed. Patient taking differently: Take 50 mg by mouth every 6 (six) hours as needed for moderate pain.  01/10/20  Yes Wieters, Hallie C, PA-C  VITAMIN D PO Take 1 tablet by mouth daily.    Yes [provider]  zolpidem (AMBIEN) 5 MG tablet Take 1 tablet (5 mg total) by mouth at bedtime. 09/20/17  Yes Mariel Aloe, MD  nitrofurantoin, macrocrystal-monohydrate, (MACROBID) 100 MG capsule Take 100 mg by mouth 2 (two) times daily. 01/19/20   [provider]   Allergies  Allergen Reactions  . Sotalol Other (See Comments)    Prolonged QTc  . Alendronate Sodium Other (See Comments)    Arm pain  . Ciprofloxacin Other (See Comments)    Not effective  . Compazine [Prochlorperazine]   . Coreg [Carvedilol] Other (See Comments)    Fatigue   . Metoprolol Other (See Comments)    Profound fatigue   Review of Systems  Eyes: Positive for itching.  Gastrointestinal: Positive for nausea.  Neurological: Positive for weakness.    Physical Exam Vitals reviewed.  HENT:     Head: Normocephalic and atraumatic.  Pulmonary:     Effort: Pulmonary effort is normal.  Skin:    General: Skin is warm and dry.  Neurological:     Mental Status: She is alert and oriented to person, place, and time.     Vital Signs: BP (!) 99/53 (BP Location: Left Arm)   Pulse 81   Temp 97.7 F (36.5 C) (Oral)   Resp 17   Ht _0  (1.651 m)   Wt 58.6 kg   SpO2 95%   BMI 21.50 kg/m  Pain Scale: 0-10   Pain Score: 7    SpO2: SpO2: 95 % O2 Device:SpO2: 95 % O2 Flow Rate: .   IO: Intake/output summary:   Intake/Output Summary (Last 24 hours) at 02/03/2020 1335 Last data filed at 02/03/2020 1030 Gross per 24 hour  Intake 440 ml  Output --  Net 440 ml    LBM: Last BM Date: 02/01/20 Baseline Weight: Weight: 56.7 kg Most recent weight: Weight: 58.6 kg      Palliative Assessment/Data: 60%   This nurse practitioner  Wadie Lessen NP discussed case and agrees with above assessment and plan.  Time In: 14:00 Time Out: 15:10 Time Total: 70 minutes Greater than 50%  of this time was spent counseling and coordinating care related to the above assessment and plan.  Signed by: Lavena Bullion, NP   Please contact Palliative Medicine Team phone at [phone] for questions and concerns.  For individual provider: See Shea Evans

## 2020-02-03 NOTE — Progress Notes (Signed)
Initial Nutrition Assessment  DOCUMENTATION CODES:   Non-severe (moderate) malnutrition in context of chronic illness  INTERVENTION:   - Ensure Enlive po BID, each supplement provides 350 kcal and 20 grams of protein  - Encourage adequate PO intake  NUTRITION DIAGNOSIS:   Moderate Malnutrition related to chronic illness (CHF, CKD stage III) as evidenced by mild fat depletion, moderate fat depletion, mild muscle depletion, moderate muscle depletion.  GOAL:   Patient will meet greater than or equal to 90% of their needs  MONITOR:   PO intake, Supplement acceptance, Labs, Weight trends, I & O's  REASON FOR ASSESSMENT:   Consult Assessment of nutrition requirement/status  ASSESSMENT:   85 year old female who presented on 5/23 with nausea and weakness. PMH of atrial fibrillation, CHF, s/p AICD, CKD stage III, arthritis. Admitted with UTI.   Noted therapies recommending SNF.  Spoke with pt at bedside. Pt drinking contrast at time of RD visit for CT scan which MD ordered to assess for diverticulitis. Pt reports not feeling well all day and feeling nauseous. Pt denies vomiting.  Pt reports that she did eat fairly well at breakfast today. Meal completion noted to be 50%.  Pt reports having nausea since the beginning of May when she was diagnosed with shingles and started on antibiotics. Pt reports that she has not been eating full meals during this time due to nausea.  Pt endorses weight loss that she believes may have started back in December of 2018 when she had a cardiac arrest. Pt reports her UBW as 135 lbs and reports that she now weighs around 125 lbs.  Reviewed weight history in chart. Pt with a 2.7 kg weight loss since 12/23/19. This is a 4.4% weight loss which is not quite significant for timeframe but is concerning given pt's ongoing nausea. Pt meets criteria for moderate malnutrition.  Pt amenable to consuming an oral nutrition supplement during admission. RD will order  Ensure Enlive.  Meal Completion: 50% x 1 recorded meal  Medications reviewed and include: IV abx IVF: NS @ 50 ml/hr  Labs reviewed.  NUTRITION - FOCUSED PHYSICAL EXAM:    Most Recent Value  Orbital Region  Moderate depletion  Upper Arm Region  Mild depletion  Thoracic and Lumbar Region  Mild depletion  Buccal Region  Mild depletion  Temple Region  Moderate depletion  Clavicle Bone Region  Moderate depletion  Clavicle and Acromion Bone Region  Moderate depletion  Scapular Bone Region  Mild depletion  Dorsal Hand  Moderate depletion  Patellar Region  Mild depletion  Anterior Thigh Region  Moderate depletion  Posterior Calf Region  Mild depletion  Edema (RD Assessment)  None  Hair  Reviewed  Eyes  Reviewed  Mouth  Reviewed  Skin  Reviewed  Nails  Reviewed       Diet Order:   Diet Order            Diet Heart Room service appropriate? Yes; Fluid consistency: Thin; Fluid restriction: 1500 mL Fluid  Diet effective now              EDUCATION NEEDS:   Education needs have been addressed  Skin:  Skin Assessment: Reviewed RN Assessment  Last BM:  02/01/20  Height:   Ht Readings from Last 1 Encounters:  02/01/20 5\' 5"  (1.651 m)    Weight:   Wt Readings from Last 1 Encounters:  02/01/20 58.6 kg    Ideal Body Weight:  56.8 kg  BMI:  Body mass index is  21.5 kg/m.  Estimated Nutritional Needs:   Kcal:  1400-1600  Protein:  70-80 grams  Fluid:  1.4-1.6 L    Gaynell Face, MS, RD, LDN Inpatient Clinical Dietitian Pager: [phone] Weekend/After Hours: [phone]

## 2020-02-04 DIAGNOSIS — I480 Paroxysmal atrial fibrillation: Secondary | ICD-10-CM

## 2020-02-04 LAB — BASIC METABOLIC PANEL
Anion gap: 8 (ref 5–15)
BUN: 12 mg/dL (ref 8–23)
CO2: 21 mmol/L — ABNORMAL LOW (ref 22–32)
Calcium: 8.8 mg/dL — ABNORMAL LOW (ref 8.9–10.3)
Chloride: 106 mmol/L (ref 98–111)
Creatinine, Ser: 0.87 mg/dL (ref 0.44–1.00)
GFR calc Af Amer: >60 mL/min (ref >60)
GFR calc non Af Amer: >60 mL/min (ref >60)
Glucose, Bld: 120 mg/dL — ABNORMAL HIGH (ref 70–99)
Potassium: 4 mmol/L (ref 3.5–5.1)
Sodium: 135 mmol/L (ref 135–145)

## 2020-02-04 LAB — URINE CULTURE: Culture: >=100000 — AB

## 2020-02-04 LAB — CBC WITH DIFFERENTIAL/PLATELET
Abs Immature Granulocytes: 0.02 10*3/uL (ref 0.00–0.07)
Basophils Absolute: 0 10*3/uL (ref 0.0–0.1)
Basophils Relative: 1 %
Eosinophils Absolute: 0.1 10*3/uL (ref 0.0–0.5)
Eosinophils Relative: 1 %
HCT: 39.4 % (ref 36.0–46.0)
Hemoglobin: 14.2 g/dL (ref 12.0–15.0)
Immature Granulocytes: 0 %
Lymphocytes Relative: 23 %
Lymphs Abs: 1.3 10*3/uL (ref 0.7–4.0)
MCH: 34.5 pg — ABNORMAL HIGH (ref 26.0–34.0)
MCHC: 36 g/dL (ref 30.0–36.0)
MCV: 95.6 fL (ref 80.0–100.0)
Monocytes Absolute: 0.7 10*3/uL (ref 0.1–1.0)
Monocytes Relative: 12 %
Neutro Abs: 3.5 10*3/uL (ref 1.7–7.7)
Neutrophils Relative %: 63 %
Platelets: 162 10*3/uL (ref 150–400)
RBC: 4.12 MIL/uL (ref 3.87–5.11)
RDW: 13 % (ref 11.5–15.5)
WBC: 5.6 10*3/uL (ref 4.0–10.5)
nRBC: 0 % (ref 0.0–0.2)

## 2020-02-04 LAB — GLUCOSE, CAPILLARY: Glucose-Capillary: 99 mg/dL (ref 70–99)

## 2020-02-04 MED ORDER — CEPHALEXIN 500 MG PO CAPS
500.0000 mg | ORAL_CAPSULE | Freq: Three times a day (TID) | ORAL | Status: DC
  Administered 2020-02-04 – 2020-02-06 (×7): 500 mg via ORAL
  Filled 2020-02-04 (×7): qty 1

## 2020-02-04 NOTE — NC FL2 (Signed)
[place] LEVEL OF CARE SCREENING TOOL     IDENTIFICATION  Patient Name: Cheryl Vargas Birthdate: Aug 29, 1935 Sex: female Admission Date (Current Location): 02/01/2020  [facility] and [place] Number:  Herbalist and Address:  [facility], [place] [address], [place], [place] [address]      Provider Number: [id number]  Attending Physician Name and Address:  Bonnielee Haff, MD  Relative Name and Phone Number:  Merlyn Albert   [phone]    Current Level of Care:   Recommended Level of Care: [place] Prior Approval Number:    Date Approved/Denied:   PASRR Number: [id number]  Discharge Plan: SNF    Current Diagnoses: Patient Active Problem List   Diagnosis Date Noted  . Malnutrition of moderate degree 02/03/2020  . DNR (do not resuscitate) discussion   . Palliative care by specialist   . UTI (urinary tract infection) 02/01/2020  . Prolonged QT interval 02/01/2020  . Chronic anticoagulation 02/01/2020  . Transient hypotension 02/01/2020  . History of shingles 02/01/2020  . Near syncope 05/05/2019  . Hypercholesterolemia without hypertriglyceridemia 02/13/2018  . Syncope 01/21/2018  . Constipation 01/21/2018  . CHF exacerbation ([place]) 12/18/2017  . CKD (chronic kidney disease), stage III 12/16/2017  . SOB (shortness of breath)   . Abdominal pain 09/18/2017  . CHF (congestive heart failure) ([place]) 09/17/2017  . Pulmonary edema   . Cardiac device in situ   . Ventricular tachycardia ([place])   . Cardiac arrest ([place])   . Severe mitral valve regurgitation   . A-fib ([place]) 09/03/2017  . Mitral regurgitation   . Acute on chronic systolic heart failure ([place])   . Non-ischemic cardiomyopathy ([place])   . CHF (congestive heart failure), NYHA class IV ([place]) 04/24/2016  . Dizziness 12/01/2015  . Hypertension 12/01/2015  . Insomnia 12/01/2015  . Chronic combined systolic and diastolic CHF (congestive heart failure) ([place])  12/01/2015  . Paroxysmal atrial fibrillation ([facility])   . Cardiac arrhythmia 10/20/2014  . Systolic dysfunction 50/53/9767  . Hypoventilation, idiopathic 10/20/2014  . Weakness 10/20/2014  . Closed left hip fracture ([place]) 03/17/2013  . Cardiomegaly 03/17/2013  . Frequent PVCs 03/17/2013  . Hyperlipidemia 03/17/2013    Orientation RESPIRATION BLADDER Height & Weight     Self, Time, Situation, Place  Normal Continent Weight: 136 lb 7.4 oz (61.9 kg) Height:  5\' 5"  (165.1 cm)  BEHAVIORAL SYMPTOMS/MOOD NEUROLOGICAL BOWEL NUTRITION STATUS  (none) (none) Continent Diet(see discharge summary)  AMBULATORY STATUS COMMUNICATION OF NEEDS Skin   Extensive Assist Verbally Normal                       Personal Care Assistance Level of Assistance  Bathing, Feeding, Dressing Bathing Assistance: Maximum assistance Feeding assistance: Independent Dressing Assistance: Limited assistance     Functional Limitations Info  Sight, Hearing, Speech Sight Info: Adequate Hearing Info: Adequate Speech Info: Adequate    SPECIAL CARE FACTORS FREQUENCY  PT (By licensed PT), OT (By licensed OT)     PT Frequency: 5x/week OT Frequency: 5x/week            Contractures Contractures Info: Not present    Additional Factors Info  Code Status, Allergies Code Status Info: Full code Allergies Info: Sotalol; Alendronate Sodium; Ciprofloxacin; Compazine prochlorperazine; Coreg carvedilol; Metoprolol           Current Medications (02/04/2020):  This is the current hospital active medication list Current Facility-Administered Medications  Medication Dose Route Frequency Provider  Last Rate Last Admin  . acetaminophen (TYLENOL) tablet 650 mg  650 mg Oral Q6H PRN Norval Morton, MD   650 mg at 02/02/20 2104   Or  . acetaminophen (TYLENOL) suppository 650 mg  650 mg Rectal Q6H PRN Fuller Plan A, MD      . albuterol (PROVENTIL) (2.5 MG/3ML) 0.083% nebulizer solution 2.5 mg  2.5 mg Nebulization Q6H  PRN Smith, Rondell A, MD      . cefTRIAXone (ROCEPHIN) 1 g in sodium chloride 0.9 % 100 mL IVPB  1 g Intravenous Q24H Smith, Rondell A, MD 200 mL/hr at 02/03/20 1257 1 g at 02/03/20 1257  . digoxin (LANOXIN) tablet 0.0625 mg  0.0625 mg Oral Daily Tamala Julian, Rondell A, MD   0.0625 mg at 02/04/20 1610  . famotidine (PEPCID) tablet 20 mg  20 mg Oral Daily PRN Smith, Rondell A, MD      . feeding supplement (ENSURE ENLIVE) (ENSURE ENLIVE) liquid 237 mL  237 mL Oral BID BM Hosie Poisson, MD      . fluticasone (FLONASE) 50 MCG/ACT nasal spray 1 spray  1 spray Each Nare Daily PRN Tamala Julian, Rondell A, MD      . LORazepam (ATIVAN) tablet 0.5 mg  0.5 mg Oral Q6H PRN Hosie Poisson, MD   0.5 mg at 02/04/20 0213  . morphine 2 MG/ML injection 0.5 mg  0.5 mg Intravenous Q4H PRN Hosie Poisson, MD      . ondansetron (ZOFRAN) injection 4 mg  4 mg Intravenous Q6H PRN Hosie Poisson, MD   4 mg at 02/03/20 1253  . psyllium (HYDROCIL/METAMUCIL) packet 1 packet  1 packet Oral Daily PRN Hosie Poisson, MD      . Rivaroxaban (XARELTO) tablet 15 mg  15 mg Oral Q supper Fuller Plan A, MD   15 mg at 02/03/20 1727  . senna-docusate (Senokot-S) tablet 2 tablet  2 tablet Oral BID PRN Hosie Poisson, MD      . sodium chloride flush (NS) 0.9 % injection 3 mL  3 mL Intravenous Q12H Smith, Rondell A, MD   3 mL at 02/03/20 2120  . traMADol (ULTRAM) tablet 50-100 mg  50-100 mg Oral Q6H PRN Hosie Poisson, MD      . zolpidem (AMBIEN) tablet 5 mg  5 mg Oral QHS Fuller Plan A, MD   5 mg at 02/03/20 2119     Discharge Medications: Please see discharge summary for a list of discharge medications.  Relevant Imaging Results:  Relevant Lab Results:   Additional Information [id number]  Bethann Berkshire, LCSW

## 2020-02-04 NOTE — Progress Notes (Signed)
PROGRESS NOTE    Cheryl Vargas  EHU:[id number] DOB: 10/18/1934 DOA: 02/01/2020 PCP: Lavone Orn, MD    Chief Complaint  Patient presents with  . Diarrhea  . Nausea    Brief Narrative:  85 year old lady with prior history of persistent atrial fibrillation, chronic systolic heart failure last EF noted to be around 20%, s/p AICD, stage IIIa CKD, arthritis presents with worsening nausea and weakness for over a month.  Of note she has been diagnosed with shingles on May 1 has been treated for 7 days with Valtrex.  She was also recently treated for urinary tract infection and nitrofurantoin for about 4 days presents to ED fir generalized weakness , nausea.  she was found to be hypotensive, dehydrated and has a  urinary tract infection.  Subjective: Patient states that she continues to have some nausea but denies any vomiting.  Was able to tolerate her breakfast.  Abdominal pain is improved.  Has had a few loose stools.   Assessment & Plan:   SIRS probably from UTI:  Patient was started on ceftriaxone.  Urine cultures growing E. coli, pansensitive.  Will change her to cephalexin.  Her GI symptoms most likely due to UTI.  Abdomen is benign.  CT scan done yesterday did not show any acute findings.  She is tolerating her diet.  She remains afebrile.  BC is normal.  Recent diagnosis of shingles:  Completed the course of valtrex. Pain control.   Chronic systolic and diastolic heart failure:  She was thought to be dehydrated.  Her diuretics were held.   Echocardiogram from 09/2019 showed Left ventricular ejection fraction, by visual estimation, is <20%. The left ventricle has severely decreased function. There is no left  ventricular hypertrophy.  The left ventricle demonstrates global hypokinesis. Global right ventricle has mildly reduced systolic function.The right ventricular size is mildly enlarged. No increase in right ventricular wall thickness. S/p AICD. QTC is 0.35  Persistent atrial  fibrillation  Rate controlled on Digoxin.  On xarelto for anti coagulation.  CHA2DS2-VASc 2 score is 3.  Last echocardiogram reviewed.   Hypotension:  Blood pressure noted to be soft.  She is asymptomatic.  Holding her diuretics and ARB.  Goals of care Due to multiple medical issues, poor functional status, deconditioning, palliative care consulted for goals of care, PT/OT EVAL and dietary consult. However she would like to be a full code for now.    DVT prophylaxis: xarelto  Code Status:  Full code.  Family Communication:  None at bedside.she said she will call her son and update him.  Disposition:   Status is: Inpatient  Remains inpatient appropriate because:IV treatments appropriate due to intensity of illness or inability to take PO,    Dispo: The patient is from: Home              Anticipated d/c is to: SNF              Anticipated d/c date is: May 27              Patient currently is not medically stable to d/c.        Consultants:   None.    Procedures: none  Antimicrobials:  Anti-infectives (From admission, onward)   Start     Dose/Rate Route Frequency Ordered Stop   02/02/20 1400  cefTRIAXone (ROCEPHIN) 1 g in sodium chloride 0.9 % 100 mL IVPB     1 g 200 mL/hr over 30 Minutes Intravenous Every 24 hours 02/01/20  1633     02/01/20 1430  cefTRIAXone (ROCEPHIN) 1 g in sodium chloride 0.9 % 100 mL IVPB     1 g 200 mL/hr over 30 Minutes Intravenous  Once 02/01/20 1420 02/01/20 1537         Objective: Vitals:   02/03/20 2228 02/04/20 0500 02/04/20 0829 02/04/20 0938  BP: (!) 105/54  (!) 96/49   Pulse: 79  81 80  Resp:   19   Temp: 98.2 F (36.8 C)  97.6 F (36.4 C)   TempSrc:      SpO2: 97%  95%   Weight:  61.9 kg    Height:        Intake/Output Summary (Last 24 hours) at 02/04/2020 1250 Last data filed at 02/04/2020 1000 Gross per 24 hour  Intake 981.83 ml  Output --  Net 981.83 ml   Filed Weights   02/01/20 1313 02/01/20 1943 02/04/20  0500  Weight: 56.7 kg 58.6 kg 61.9 kg    Examination:  General appearance: Awake alert.  In no distress Resp: Clear to auscultation bilaterally.  Normal effort Cardio: S1-S2 is normal regular.  No S3-S4.  No rubs murmurs or bruit GI: Abdomen is soft.  Nontender nondistended.  Bowel sounds are present normal.  No masses organomegaly Extremities: No edema.  Full range of motion of lower extremities. Neurologic: Alert and oriented x3.  No focal neurological deficits.      Data Reviewed: I have personally reviewed following labs and imaging studies  CBC: Recent Labs  Lab 02/01/20 1327 02/02/20 0306 02/04/20 0304  WBC 7.0 6.0 5.6  NEUTROABS  --   --  3.5  HGB 16.4* 14.7 14.2  HCT 46.4* 41.1 39.4  MCV 98.1 95.8 95.6  PLT 213 170 767    Basic Metabolic Panel: Recent Labs  Lab 02/01/20 1327 02/02/20 0306 02/03/20 1203 02/04/20 0304  NA 136 138 135 135  K 4.6 4.3 4.0 4.0  CL 100 106 105 106  CO2 23 21* 21* 21*  GLUCOSE 134* 91 114* 120*  BUN 19 17 16 12   CREATININE 1.15* 1.07* 0.93 0.87  CALCIUM 9.2 8.6* 8.8* 8.8*    GFR: Estimated Creatinine Clearance: 44.9 mL/min (by C-G formula based on SCr of 0.87 mg/dL).  Liver Function Tests: Recent Labs  Lab 02/01/20 1327 02/03/20 1203  AST 30 20  ALT 24 18  ALKPHOS 51 46  BILITOT 1.8* 0.6  PROT 6.8 6.3*  ALBUMIN 3.5 3.2*    CBG: No results for input(s): GLUCAP in the last 168 hours.   Recent Results (from the past 240 hour(s))  SARS Coronavirus 2 by RT PCR (hospital order, performed in [facility] hospital lab) Nasopharyngeal Nasopharyngeal Swab     Status: None   Collection Time: 02/01/20  2:31 PM   Specimen: Nasopharyngeal Swab  Result Value Ref Range Status   SARS Coronavirus 2 NEGATIVE NEGATIVE Final    Comment: (NOTE) SARS-CoV-2 target nucleic acids are NOT DETECTED. The SARS-CoV-2 RNA is generally detectable in upper and lower respiratory specimens during the acute phase of infection. The  lowest concentration of SARS-CoV-2 viral copies this assay can detect is 250 copies / mL. A negative result does not preclude SARS-CoV-2 infection and should not be used as the sole basis for treatment or other patient management decisions.  A negative result may occur with improper specimen collection / handling, submission of specimen other than nasopharyngeal swab, presence of viral mutation(s) within the areas targeted by this assay, and inadequate  number of viral copies (<250 copies / mL). A negative result must be combined with clinical observations, patient history, and epidemiological information. Fact Sheet for Patients:   StrictlyIdeas.no Fact Sheet for Healthcare Providers: BankingDealers.co.za This test is not yet approved or cleared  by the [place] FDA and has been authorized for detection and/or diagnosis of SARS-CoV-2 by FDA under an Emergency Use Authorization (EUA).  This EUA will remain in effect (meaning this test can be used) for the duration of the COVID-19 declaration under Section 564(b)(1) of the Act, 21 U.S.C. section 360bbb-3(b)(1), unless the authorization is terminated or revoked sooner. Performed at [facility], [place] [address], [place], [place] [address]   Culture, Urine     Status: Abnormal   Collection Time: 02/01/20  3:21 PM   Specimen: Urine, Random  Result Value Ref Range Status   Specimen Description URINE, RANDOM  Final   Special Requests   Final    NONE Performed at [facility], [place] [address], [place], [place] [address]    Culture >=100,000 COLONIES/mL ESCHERICHIA COLI (A)  Final   Report Status 02/04/2020 FINAL  Final   Organism ID, Bacteria ESCHERICHIA COLI (A)  Final      Susceptibility   Escherichia coli - MIC*    AMPICILLIN 8 SENSITIVE Sensitive     CEFAZOLIN <=4 SENSITIVE Sensitive     CEFTRIAXONE <=1 SENSITIVE Sensitive     CIPROFLOXACIN <=0.25 SENSITIVE Sensitive      GENTAMICIN <=1 SENSITIVE Sensitive     IMIPENEM <=0.25 SENSITIVE Sensitive     NITROFURANTOIN <=16 SENSITIVE Sensitive     TRIMETH/SULFA <=20 SENSITIVE Sensitive     AMPICILLIN/SULBACTAM 4 SENSITIVE Sensitive     PIP/TAZO <=4 SENSITIVE Sensitive     * >=100,000 COLONIES/mL ESCHERICHIA COLI         Radiology Studies: CT ABDOMEN PELVIS W CONTRAST  Result Date: 02/03/2020 CLINICAL DATA:  Nausea, vomiting, abdominal pain, loose bowel movements EXAM: CT ABDOMEN AND PELVIS WITH CONTRAST TECHNIQUE: Multidetector CT imaging of the abdomen and pelvis was performed using the standard protocol following bolus administration of intravenous contrast. CONTRAST:  178mL OMNIPAQUE IOHEXOL 300 MG/ML  SOLN COMPARISON:  01/19/2020 FINDINGS: Lower chest: No acute pleural or parenchymal lung disease. Hepatobiliary: Stable hepatic cysts. The remainder of the liver and gallbladder are unremarkable. No change. Pancreas: Unremarkable. No pancreatic ductal dilatation or surrounding inflammatory changes. Spleen: Normal in size without focal abnormality. Adrenals/Urinary Tract: Mildly complex right renal cyst is unchanged. There are main der of the kidneys enhance normally. No urinary tract calculi or obstructive uropathy. Bladder is minimally distended with no focal abnormalities. The adrenals are normal. Stomach/Bowel: No bowel obstruction or ileus. Normal appendix right lower quadrant. There is diverticulosis of the sigmoid colon unchanged. Colon is decompressed, with no definite bowel wall thickening or inflammatory change. Vascular/Lymphatic: Aortic atherosclerosis. No enlarged abdominal or pelvic lymph nodes. Reproductive: Uterus and bilateral adnexa are unremarkable. Other: Trace free fluid in the pelvis is nonspecific. No free intraperitoneal gas. No abdominal wall hernia. Musculoskeletal: Left hip arthroplasty unchanged. There are no acute or destructive bony lesions. IMPRESSION: 1. Trace pelvic free fluid is  nonspecific. 2. Stable diverticulosis of the sigmoid colon. Colon is decompressed which limits evaluation, without definitive signs of diverticulitis on today's study. 3. Stable mildly complex right renal cyst. 4.  Aortic Atherosclerosis (ICD10-I70.0). Electronically Signed   By: Randa Ngo M.D.   On: 02/03/2020 16:48        Scheduled Meds: .  digoxin  0.0625 mg Oral Daily  . feeding supplement (ENSURE ENLIVE)  237 mL Oral BID BM  . Rivaroxaban  15 mg Oral Q supper  . sodium chloride flush  3 mL Intravenous Q12H  . zolpidem  5 mg Oral QHS   Continuous Infusions: . cefTRIAXone (ROCEPHIN)  IV 1 g (02/03/20 1257)     LOS: 3 days       Bonnielee Haff, MD [facility]   To contact the attending provider between 7A-7P or the covering provider during after hours 7P-7A, please log into the web site [email or web address] and access using universal [place] password for that web site. If you do not have the password, please call the hospital operator.  02/04/2020, 12:50 PM

## 2020-02-04 NOTE — TOC Initial Note (Addendum)
Transition of Care [facility]) - Initial/Assessment Note    Patient Details  Name: Cheryl Vargas MRN: [id number] Date of Birth: 10-21-1934  Transition [facility] Care [facility]) CM/SW Contact:    Bethann Berkshire, [place] Phone Number: 02/04/2020, 12:55 PM  Clinical Narrative:                  CSW received consult for possible SNF placement at time of discharge. CSW spoke with patient regarding PT recommendation of SNF placement at time of discharge. Patient reported she lives at home and does not have any caregiver for assistance. Patient expressed understanding of PT recommendation and is agreeable to SNF placement at time of discharge. Patient reports preference for [facility] but is open to other SNFS in [place]. Pt gives permission for CSW to contact son [name]. CSW called son [name] who is also agreeable for SNF plan. Fl2 completed and bed requests sent. TOC will continue to follow.   1550: Met with pt and presented offers. Pt wants to wait on offer response from [facility] prior to decision and will likely decide between [facility] and [facility]. CSW also addressed palliative referral consult. Pt explains she has more questions about palliative and plans to reach out to palliative at [facility] and do more research before deciding on whether or not she wants home plliative.   Expected Discharge Plan: Skilled Nursing Facility Barriers to Discharge: Continued Medical Work up   Patient Goals and CMS Choice Patient states their goals for this hospitalization and ongoing recovery are:: SNF      Expected Discharge Plan and Services Expected Discharge Plan: [place]       Living arrangements for the past 2 months: Apartment                                      Prior Living Arrangements/Services Living arrangements for the past 2 months: Apartment Lives with:: Self Patient language and need for interpreter reviewed:: Yes Do you feel safe going back to the place where you live?: Yes       Need for Family Participation in Patient Care: Yes (Comment) Care giver support system in place?: No (comment)   Criminal Activity/Legal Involvement Pertinent to Current Situation/Hospitalization: No - Comment as needed  Activities of Daily Living Home Assistive Devices/Equipment: None ADL Screening (condition at time of admission) Patient's cognitive ability adequate to safely complete daily activities?: Yes Is the patient deaf or have difficulty hearing?: No Does the patient have difficulty seeing, even when wearing glasses/contacts?: No Does the patient have difficulty concentrating, remembering, or making decisions?: No Patient able to express need for assistance with ADLs?: Yes Does the patient have difficulty dressing or bathing?: No Independently performs ADLs?: Yes (appropriate for developmental age) Does the patient have difficulty walking or climbing stairs?: No Weakness of Legs: None Weakness of Arms/Hands: None  Permission Sought/Granted   Permission granted to share information with : Yes, Verbal Permission Granted  Share Information with NAME: Sons [name] and Tim           Emotional Assessment Appearance:: Appears stated age Attitude/Demeanor/Rapport: Engaged Affect (typically observed): Accepting, Calm Orientation: : Oriented to Self, Oriented to Place, Oriented to  Time, Oriented to Situation Alcohol / Substance Use: Not Applicable Psych Involvement: No (comment)  Admission diagnosis:  Nausea [R11.0] UTI (urinary tract infection) [N39.0] Weakness [R53.1] Urinary tract infection without hematuria, site unspecified [N39.0] Patient Active Problem List  Diagnosis Date Noted  . Malnutrition of moderate degree 02/03/2020  . DNR (do not resuscitate) discussion   . Palliative care by specialist   . UTI (urinary tract infection) 02/01/2020  . Prolonged QT interval 02/01/2020  . Chronic anticoagulation 02/01/2020  . Transient hypotension 02/01/2020  . History of  shingles 02/01/2020  . Near syncope 05/05/2019  . Hypercholesterolemia without hypertriglyceridemia 02/13/2018  . Syncope 01/21/2018  . Constipation 01/21/2018  . CHF exacerbation ([place]) 12/18/2017  . CKD (chronic kidney disease), stage III 12/16/2017  . SOB (shortness of breath)   . Abdominal pain 09/18/2017  . CHF (congestive heart failure) ([place]) 09/17/2017  . Pulmonary edema   . Cardiac device in situ   . Ventricular tachycardia ([place])   . Cardiac arrest ([place])   . Severe mitral valve regurgitation   . A-fib ([place]) 09/03/2017  . Mitral regurgitation   . Acute on chronic systolic heart failure ([place])   . Non-ischemic cardiomyopathy ([place])   . CHF (congestive heart failure), NYHA class IV ([place]) 04/24/2016  . Dizziness 12/01/2015  . Hypertension 12/01/2015  . Insomnia 12/01/2015  . Chronic combined systolic and diastolic CHF (congestive heart failure) ([place]) 12/01/2015  . Paroxysmal atrial fibrillation ([facility])   . Cardiac arrhythmia 10/20/2014  . Systolic dysfunction 86/75/4492  . Hypoventilation, idiopathic 10/20/2014  . Weakness 10/20/2014  . Closed left hip fracture ([place]) 03/17/2013  . Cardiomegaly 03/17/2013  . Frequent PVCs 03/17/2013  . Hyperlipidemia 03/17/2013   PCP:  Lavone Orn, MD Pharmacy:   [facility] [place], [place] [address] Phone: [phone] Fax: [phone]  [place], [place] - [address] [place] [address] Phone: [phone] Fax: [phone]  [facility] - [place], [place] - [address] [place] [address] Phone: [phone] Fax: [phone]     Social Determinants of Health (SDOH) Interventions    Readmission Risk Interventions No flowsheet data found.

## 2020-02-05 DIAGNOSIS — N390 Urinary tract infection, site not specified: Principal | ICD-10-CM

## 2020-02-05 MED ORDER — SENNOSIDES-DOCUSATE SODIUM 8.6-50 MG PO TABS
2.0000 | ORAL_TABLET | Freq: Two times a day (BID) | ORAL | Status: DC
  Administered 2020-02-05 – 2020-02-06 (×3): 2 via ORAL
  Filled 2020-02-05 (×3): qty 2

## 2020-02-05 MED ORDER — POLYETHYLENE GLYCOL 3350 17 G PO PACK
17.0000 g | PACK | Freq: Two times a day (BID) | ORAL | Status: DC
  Administered 2020-02-05 – 2020-02-06 (×3): 17 g via ORAL
  Filled 2020-02-05 (×3): qty 1

## 2020-02-05 MED ORDER — HYDROCORTISONE 1 % EX CREA
TOPICAL_CREAM | Freq: Three times a day (TID) | CUTANEOUS | Status: DC | PRN
  Filled 2020-02-05: qty 28

## 2020-02-05 MED ORDER — FLEET ENEMA 7-19 GM/118ML RE ENEM
1.0000 | ENEMA | Freq: Every day | RECTAL | Status: DC | PRN
  Filled 2020-02-05: qty 1

## 2020-02-05 MED ORDER — FLEET ENEMA 7-19 GM/118ML RE ENEM
1.0000 | ENEMA | Freq: Once | RECTAL | Status: AC
  Administered 2020-02-06: 1 via RECTAL

## 2020-02-05 NOTE — Progress Notes (Signed)
Daily Progress Note   Patient Name: Cheryl Vargas       Date: 02/05/2020 DOB: 03-07-1935  Age: 85 y.o. MRN#: [id number] Attending Physician: Bonnielee Haff, MD Primary Care Physician: Lavone Orn, MD Admit Date: 02/01/2020  Reason for Consultation/Follow-up: continuing [place] discussion  HPI/Patient Profile: 85 y.o. female  with past medical history of systolic HF with EF 01%, AICD placement in 2018, chronic kidney disease stage III, and Atrial fibrillation admitted on 02/01/2020 with UTI and hypotension. She presented to the ED with complaint of weakness, nausea, and dehydration progressively worsening over the past week.   Subjective: Patient is lying in bed, states she feels "rotten". She complains of nausea, has asked for prn medication. She states that nausea has been a chronic issue for her over the past 2 years. She takes ativan at home which helps, but has been also requiring zofran during this admission. She also expresses concern that she has not had a BM since admission. I offered reassurance that the medical team had started a regimen to help address this. She reports that constipation has become a chronic issue for her. I offered education that she can take senna daily at home to help prevent constipation.   Discussed completing the MOST form, patient states she is still waiting to discuss this with her son. I advised her she can complete this with the outpatient palliative NP if she prefers.   Length of Stay: 4  Current Medications: Scheduled Meds:  . cephALEXin  500 mg Oral Q8H  . digoxin  0.0625 mg Oral Daily  . feeding supplement (ENSURE ENLIVE)  237 mL Oral BID BM  . polyethylene glycol  17 g Oral BID  . Rivaroxaban  15 mg Oral Q supper  . senna-docusate  2 tablet Oral  BID  . sodium chloride flush  3 mL Intravenous Q12H  . zolpidem  5 mg Oral QHS    Continuous Infusions:   PRN Meds: acetaminophen **OR** acetaminophen, albuterol, famotidine, fluticasone, hydrocortisone cream, LORazepam, morphine injection, ondansetron (ZOFRAN) IV, psyllium, traMADol  Physical Exam Constitutional:      General: She is not in acute distress.    Appearance: She is ill-appearing.  HENT:     Head: Normocephalic and atraumatic.  Pulmonary:     Effort: Pulmonary effort is normal.  Skin:  General: Skin is warm and dry.  Neurological:     Mental Status: She is alert and oriented to person, place, and time.             Vital Signs: BP 110/64 (BP Location: Right Arm)   Pulse 80   Temp 97.9 F (36.6 C) (Oral)   Resp 16   Ht 5\' 5"  (1.651 m)   Wt 62.1 kg   SpO2 97%   BMI 22.78 kg/m  SpO2: SpO2: 97 % O2 Device: O2 Device: Room Air O2 Flow Rate:    Intake/output summary:   Intake/Output Summary (Last 24 hours) at 02/05/2020 1027 Last data filed at 02/04/2020 2200 Gross per 24 hour  Intake 120 ml  Output -  Net 120 ml   LBM: Last BM Date: 02/01/20 Baseline Weight: Weight: 56.7 kg Most recent weight: Weight: 62.1 kg       Palliative Assessment/Data: 50-60%      Palliative Care Assessment & Plan   Recommendations/Plan:  Full code/full scope  Continue ativan and zofran prn for nausea  Follow-up with outpatient palliative care  Code Status:  Full code  Prognosis:   guarded  Discharge Planning:  [place] for rehab with Palliative care service follow-up  Care plan was discussed with CSW  Thank you for allowing the Palliative Medicine Team to assist in the care of this patient.   Total Time 15 minutes Prolonged Time Billed  no       Greater than 50%  of this time was spent counseling and coordinating care related to the above assessment and plan.  Lavena Bullion, NP  Please contact Palliative Medicine Team  phone at [phone] for questions and concerns.   Vinie Sill, NP Palliative Medicine Team Pager [phone] (Please see [email or web address] for schedule) Team Phone [phone]    Greater than 50%  of this time was spent counseling and coordinating care related to the above assessment and plan

## 2020-02-05 NOTE — TOC Progression Note (Addendum)
Transition of Care [facility]) - Progression Note    Patient Details  Name: TEASIA ZAPF MRN: [id number] Date of Birth: 12-26-1934  Transition of Care [facility]) CM/SW [place], [place] Phone Number: 02/05/2020, 2:55 PM  Clinical Narrative:     CSW met with pt and presented bed offer for [facility] and [facility]. Pt. Chooses [facility] SNF. Pt unsure about non emergency EMS transport vs family; concerns of cost. Pt also has concerns of cost for outpatient palliative.   CSW confirms choice with pts son. CSW confirms bed for 02/06/20 with [facility]. Auth started for [employer].   CSW informs pt of [facility] 100% coverage for outpatient palliative as well as 250$ copay for non emergency ambulance. Pt chooses palliative with [facility]. CSW contacted Bevely Palmer with [facility] and established referral.   9412: [facility] called and explained hub is down. Requested CSW fax referral. Clinicals faxed.  Expected Discharge Plan: [place] Barriers to Discharge: Continued Medical Work up  Expected Discharge Plan and Services Expected Discharge Plan: [place] arrangements for the past 2 months: Apartment                                       Social Determinants of Health (SDOH) Interventions    Readmission Risk Interventions No flowsheet data found.

## 2020-02-05 NOTE — Plan of Care (Signed)
  Problem: Education: Goal: Knowledge of General Education information will improve Description: Including pain rating scale, medication(s)/side effects and non-pharmacologic comfort measures Outcome: Progressing   Problem: Health Behavior/Discharge Planning: Goal: Ability to manage health-related needs will improve Outcome: Progressing   Problem: Activity: Goal: Risk for activity intolerance will decrease Outcome: Progressing   Problem: Nutrition: Goal: Adequate nutrition will be maintained Outcome: Progressing  Patient able to tolerate intake for breakfast and dinner, patient requesting ativan for nausea at lunch. Patient denies sob after participting in physical therapy. Patient states Pain is well controlledby PRN tramadol.

## 2020-02-05 NOTE — Progress Notes (Signed)
Physical Therapy Treatment Patient Details Name: Cheryl Vargas MRN: [id number] DOB: 1934-09-20 Today's Date: 02/05/2020    History of Present Illness Pt is an 85 year old woman admitted from home with nausea, weakness and falls, + UTI, hypotension and dehydration. PMH: recent shingels, afib, CHF with EF of 20%, s/p AICD, CKDIII, arthritis.     PT Comments    Pt agreeable to participating.  Much less anxious with back pain, but feels it's manageable.  Emphasis on gait stability and stamina.    Follow Up Recommendations  SNF;Supervision/Assistance - 24 hour     Equipment Recommendations  None recommended by PT    Recommendations for Other Services       Precautions / Restrictions Precautions Precautions: Fall    Mobility  Bed Mobility Overal bed mobility: Modified Independent                Transfers Overall transfer level: Needs assistance   Transfers: Sit to/from Stand Sit to Stand: Min guard         General transfer comment: cues for hand placement/safety  Ambulation/Gait Ambulation/Gait assistance: Min guard Gait Distance (Feet): 150 Feet Assistive device: Rolling walker (2 wheeled) Gait Pattern/deviations: Step-through pattern Gait velocity: slower Gait velocity interpretation: <1.8 ft/sec, indicate of risk for recurrent falls General Gait Details: mild instability overall, drifting left, cues for proximity to [employer] Mobility    Modified Rankin (Stroke Patients Only)       Balance     Sitting balance-Leahy Scale: Normal       Standing balance-Leahy Scale: Fair                              Cognition Arousal/Alertness: Awake/alert Behavior During Therapy: Anxious Overall Cognitive Status: Within Functional Limits for tasks assessed                                        Exercises      General Comments        Pertinent Vitals/Pain Pain Assessment: Faces Faces  Pain Scale: Hurts little more Pain Location: back Pain Descriptors / Indicators: Aching;Guarding    Home Living                      Prior Function            PT Goals (current goals can now be found in the care plan section) Acute Rehab PT Goals Patient Stated Goal: get stronger so she can manage at home independently PT Goal Formulation: With patient Time For Goal Achievement: 02/10/20 Potential to Achieve Goals: Good Progress towards PT goals: Progressing toward goals    Frequency    Min 3X/week      PT Plan      Co-evaluation              AM-PAC PT "6 Clicks" Mobility   Outcome Measure  Help needed turning from your back to your side while in a flat bed without using bedrails?: None Help needed moving from lying on your back to sitting on the side of a flat bed without using bedrails?: None Help needed moving to and from a bed to a chair (including a wheelchair)?: A Little Help needed standing up from a  chair using your arms (e.g., wheelchair or bedside chair)?: A Little Help needed to walk in hospital room?: A Little Help needed climbing 3-5 steps with a railing? : A Little 6 Click Score: 20    End of Session   Activity Tolerance: Patient tolerated treatment well;Other (comment) Patient left: in bed;with call bell/phone within reach Nurse Communication: Mobility status PT Visit Diagnosis: Unsteadiness on feet (R26.81);Other abnormalities of gait and mobility (R26.89);Difficulty in walking, not elsewhere classified (R26.2)     Time: 6394-3200 PT Time Calculation (min) (ACUTE ONLY): 13 min  Charges:  $Gait Training: 8-22 mins                     02/05/2020  Ginger Carne., PT Acute Rehabilitation Services [phone]  (pager) [phone]  (office)   Tessie Fass Tennile Styles 02/05/2020, 6:22 PM

## 2020-02-05 NOTE — Progress Notes (Signed)
PROGRESS NOTE    Cheryl Vargas  DJS:[id number] DOB: 1934-10-07 DOA: 02/01/2020 PCP: Lavone Orn, MD    Chief Complaint  Patient presents with  . Diarrhea  . Nausea    Brief Narrative:  85 year old lady with prior history of persistent atrial fibrillation, chronic systolic heart failure last EF noted to be around 20%, s/p AICD, stage IIIa CKD, arthritis presents with worsening nausea and weakness for over a month.  Of note she has been diagnosed with shingles on May 1 has been treated for 7 days with Valtrex.  She was also recently treated for urinary tract infection and nitrofurantoin for about 4 days presents to ED fir generalized weakness , nausea.  she was found to be hypotensive, dehydrated and has a  urinary tract infection.  Subjective: Patient remained complaining of constipation.  She has not had a bowel movement in 3 to 4 days.  Continues to have some discomfort in her abdomen.   Assessment & Plan:   SIRS probably from UTI:  Patient was started on ceftriaxone.  Urine cultures growing E. coli, pansensitive.  Patient was changed over to cephalexin.  Will change her to cephalexin.  Her GI symptoms most likely due to UTI.  Abdomen is benign.  CT scan done on 5/25 did not show any acute findings.  She is tolerating her diet.  She remains afebrile.  WBC is normal.  Recent diagnosis of shingles:  Completed the course of valtrex. Pain control.   Chronic systolic and diastolic heart failure:  She was thought to be dehydrated.  Her diuretics were held.   Echocardiogram from 09/2019 showed Left ventricular ejection fraction, by visual estimation, is <20%. The left ventricle has severely decreased function. There is no left  ventricular hypertrophy.  The left ventricle demonstrates global hypokinesis. Global right ventricle has mildly reduced systolic function.The right ventricular size is mildly enlarged. No increase in right ventricular wall thickness. S/p AICD. QTC is 0.35 Due to  hypotension bisoprolol, losartan, furosemide and spironolactone oral on hold.  At discharge we can continue furosemide just as needed for lower extremity edema.  Persistent atrial fibrillation  Rate controlled on Digoxin.  On xarelto for anti coagulation.  CHA2DS2-VASc 2 score is 3.  Last echocardiogram reviewed.   Constipation Initiate laxatives and stool softeners.  Fleet enema as needed.  Hypotension:  Blood pressure noted to be soft.  She is asymptomatic.  Holding her diuretics, beta-blocker and ARB.  Goals of care Due to multiple medical issues, poor functional status, deconditioning, palliative care consulted for goals of care, PT/OT EVAL and dietary consult. However she would like to be a full code for now.  She however would want more of a focus on quality of life.  She wants to hold some of her cardiac medications due to the low blood pressure which can cause.  DVT prophylaxis: xarelto  Code Status:  Full code.  Family Communication: Discussed with the patient. Disposition:   Status is: Inpatient  Remains inpatient appropriate because:IV treatments appropriate due to intensity of illness or inability to take PO,    Dispo: The patient is from: Home              Anticipated d/c is to: SNF              Anticipated d/c date is: May 28              Patient currently is not medically stable to d/c.  Consultants:   None.    Procedures: none  Antimicrobials:  Anti-infectives (From admission, onward)   Start     Dose/Rate Route Frequency Ordered Stop   02/04/20 1400  cephALEXin (KEFLEX) capsule 500 mg     500 mg Oral Every 8 hours 02/04/20 1258 02/08/20 1359   02/02/20 1400  cefTRIAXone (ROCEPHIN) 1 g in sodium chloride 0.9 % 100 mL IVPB  Status:  Discontinued     1 g 200 mL/hr over 30 Minutes Intravenous Every 24 hours 02/01/20 1633 02/04/20 1258   02/01/20 1430  cefTRIAXone (ROCEPHIN) 1 g in sodium chloride 0.9 % 100 mL IVPB     1 g 200 mL/hr over 30  Minutes Intravenous  Once 02/01/20 1420 02/01/20 1537         Objective: Vitals:   02/05/20 0500 02/05/20 0828 02/05/20 0828 02/05/20 0853  BP:  110/64 110/64   Pulse:  80 79 80  Resp:  16 16   Temp:  97.9 F (36.6 C) 97.9 F (36.6 C)   TempSrc:  Oral    SpO2:  97% 97%   Weight: 62.1 kg     Height:        Intake/Output Summary (Last 24 hours) at 02/05/2020 1407 Last data filed at 02/04/2020 2200 Gross per 24 hour  Intake 120 ml  Output --  Net 120 ml   Filed Weights   02/01/20 1943 02/04/20 0500 02/05/20 0500  Weight: 58.6 kg 61.9 kg 62.1 kg    Examination:  General appearance: Awake alert.  In no distress Resp: Clear to auscultation bilaterally.  Normal effort Cardio: S1-S2 is normal regular.  No S3-S4.  No rubs murmurs or bruit GI: Abdomen is soft.  Nontender nondistended.  Bowel sounds are present normal.  No masses organomegaly Extremities: No edema. Neurologic: Alert and oriented x3.  No focal neurological deficits.       Data Reviewed: I have personally reviewed following labs and imaging studies  CBC: Recent Labs  Lab 02/01/20 1327 02/02/20 0306 02/04/20 0304  WBC 7.0 6.0 5.6  NEUTROABS  --   --  3.5  HGB 16.4* 14.7 14.2  HCT 46.4* 41.1 39.4  MCV 98.1 95.8 95.6  PLT 213 170 962    Basic Metabolic Panel: Recent Labs  Lab 02/01/20 1327 02/02/20 0306 02/03/20 1203 02/04/20 0304  NA 136 138 135 135  K 4.6 4.3 4.0 4.0  CL 100 106 105 106  CO2 23 21* 21* 21*  GLUCOSE 134* 91 114* 120*  BUN 19 17 16 12   CREATININE 1.15* 1.07* 0.93 0.87  CALCIUM 9.2 8.6* 8.8* 8.8*    GFR: Estimated Creatinine Clearance: 44.9 mL/min (by C-G formula based on SCr of 0.87 mg/dL).  Liver Function Tests: Recent Labs  Lab 02/01/20 1327 02/03/20 1203  AST 30 20  ALT 24 18  ALKPHOS 51 46  BILITOT 1.8* 0.6  PROT 6.8 6.3*  ALBUMIN 3.5 3.2*    CBG: Recent Labs  Lab 02/04/20 2233  GLUCAP 99     Recent Results (from the past 240 hour(s))  SARS  Coronavirus 2 by RT PCR (hospital order, performed in [facility] hospital lab) Nasopharyngeal Nasopharyngeal Swab     Status: None   Collection Time: 02/01/20  2:31 PM   Specimen: Nasopharyngeal Swab  Result Value Ref Range Status   SARS Coronavirus 2 NEGATIVE NEGATIVE Final    Comment: (NOTE) SARS-CoV-2 target nucleic acids are NOT DETECTED. The SARS-CoV-2 RNA is generally detectable in upper and  lower respiratory specimens during the acute phase of infection. The lowest concentration of SARS-CoV-2 viral copies this assay can detect is 250 copies / mL. A negative result does not preclude SARS-CoV-2 infection and should not be used as the sole basis for treatment or other patient management decisions.  A negative result may occur with improper specimen collection / handling, submission of specimen other than nasopharyngeal swab, presence of viral mutation(s) within the areas targeted by this assay, and inadequate number of viral copies (<250 copies / mL). A negative result must be combined with clinical observations, patient history, and epidemiological information. Fact Sheet for Patients:   StrictlyIdeas.no Fact Sheet for Healthcare Providers: BankingDealers.co.za This test is not yet approved or cleared  by the [place] FDA and has been authorized for detection and/or diagnosis of SARS-CoV-2 by FDA under an Emergency Use Authorization (EUA).  This EUA will remain in effect (meaning this test can be used) for the duration of the COVID-19 declaration under Section 564(b)(1) of the Act, 21 U.S.C. section 360bbb-3(b)(1), unless the authorization is terminated or revoked sooner. Performed at [facility], [place] [address], [place], [place] [address]   Culture, Urine     Status: Abnormal   Collection Time: 02/01/20  3:21 PM   Specimen: Urine, Random  Result Value Ref Range Status   Specimen Description URINE, RANDOM  Final    Special Requests   Final    NONE Performed at [facility], [place] [address] [place], [place] [address]    Culture >=100,000 COLONIES/mL ESCHERICHIA COLI (A)  Final   Report Status 02/04/2020 FINAL  Final   Organism ID, Bacteria ESCHERICHIA COLI (A)  Final      Susceptibility   Escherichia coli - MIC*    AMPICILLIN 8 SENSITIVE Sensitive     CEFAZOLIN <=4 SENSITIVE Sensitive     CEFTRIAXONE <=1 SENSITIVE Sensitive     CIPROFLOXACIN <=0.25 SENSITIVE Sensitive     GENTAMICIN <=1 SENSITIVE Sensitive     IMIPENEM <=0.25 SENSITIVE Sensitive     NITROFURANTOIN <=16 SENSITIVE Sensitive     TRIMETH/SULFA <=20 SENSITIVE Sensitive     AMPICILLIN/SULBACTAM 4 SENSITIVE Sensitive     PIP/TAZO <=4 SENSITIVE Sensitive     * >=100,000 COLONIES/mL ESCHERICHIA COLI         Radiology Studies: CT ABDOMEN PELVIS W CONTRAST  Result Date: 02/03/2020 CLINICAL DATA:  Nausea, vomiting, abdominal pain, loose bowel movements EXAM: CT ABDOMEN AND PELVIS WITH CONTRAST TECHNIQUE: Multidetector CT imaging of the abdomen and pelvis was performed using the standard protocol following bolus administration of intravenous contrast. CONTRAST:  142mL OMNIPAQUE IOHEXOL 300 MG/ML  SOLN COMPARISON:  01/19/2020 FINDINGS: Lower chest: No acute pleural or parenchymal lung disease. Hepatobiliary: Stable hepatic cysts. The remainder of the liver and gallbladder are unremarkable. No change. Pancreas: Unremarkable. No pancreatic ductal dilatation or surrounding inflammatory changes. Spleen: Normal in size without focal abnormality. Adrenals/Urinary Tract: Mildly complex right renal cyst is unchanged. There are main der of the kidneys enhance normally. No urinary tract calculi or obstructive uropathy. Bladder is minimally distended with no focal abnormalities. The adrenals are normal. Stomach/Bowel: No bowel obstruction or ileus. Normal appendix right lower quadrant. There is diverticulosis of the sigmoid colon unchanged. Colon is  decompressed, with no definite bowel wall thickening or inflammatory change. Vascular/Lymphatic: Aortic atherosclerosis. No enlarged abdominal or pelvic lymph nodes. Reproductive: Uterus and bilateral adnexa are unremarkable. Other: Trace free fluid in the pelvis is nonspecific. No free intraperitoneal gas.  No abdominal wall hernia. Musculoskeletal: Left hip arthroplasty unchanged. There are no acute or destructive bony lesions. IMPRESSION: 1. Trace pelvic free fluid is nonspecific. 2. Stable diverticulosis of the sigmoid colon. Colon is decompressed which limits evaluation, without definitive signs of diverticulitis on today's study. 3. Stable mildly complex right renal cyst. 4.  Aortic Atherosclerosis (ICD10-I70.0). Electronically Signed   By: Randa Ngo M.D.   On: 02/03/2020 16:48        Scheduled Meds: . cephALEXin  500 mg Oral Q8H  . digoxin  0.0625 mg Oral Daily  . feeding supplement (ENSURE ENLIVE)  237 mL Oral BID BM  . polyethylene glycol  17 g Oral BID  . Rivaroxaban  15 mg Oral Q supper  . senna-docusate  2 tablet Oral BID  . sodium chloride flush  3 mL Intravenous Q12H  . zolpidem  5 mg Oral QHS   Continuous Infusions:    LOS: 4 days       Bonnielee Haff, MD [facility]   To contact the attending provider between 7A-7P or the covering provider during after hours 7P-7A, please log into the web site [email or web address] and access using universal [place] password for that web site. If you do not have the password, please call the hospital operator.  02/05/2020, 2:07 PM

## 2020-02-06 DIAGNOSIS — Z515 Encounter for palliative care: Secondary | ICD-10-CM | POA: Diagnosis not present

## 2020-02-06 DIAGNOSIS — I4891 Unspecified atrial fibrillation: Secondary | ICD-10-CM | POA: Diagnosis not present

## 2020-02-06 DIAGNOSIS — Z95810 Presence of automatic (implantable) cardiac defibrillator: Secondary | ICD-10-CM | POA: Diagnosis not present

## 2020-02-06 DIAGNOSIS — R2689 Other abnormalities of gait and mobility: Secondary | ICD-10-CM | POA: Diagnosis not present

## 2020-02-06 DIAGNOSIS — F419 Anxiety disorder, unspecified: Secondary | ICD-10-CM | POA: Diagnosis not present

## 2020-02-06 DIAGNOSIS — Z7901 Long term (current) use of anticoagulants: Secondary | ICD-10-CM | POA: Diagnosis not present

## 2020-02-06 DIAGNOSIS — K5900 Constipation, unspecified: Secondary | ICD-10-CM | POA: Diagnosis not present

## 2020-02-06 DIAGNOSIS — M6281 Muscle weakness (generalized): Secondary | ICD-10-CM | POA: Diagnosis not present

## 2020-02-06 DIAGNOSIS — I5022 Chronic systolic (congestive) heart failure: Secondary | ICD-10-CM | POA: Diagnosis not present

## 2020-02-06 DIAGNOSIS — I5023 Acute on chronic systolic (congestive) heart failure: Secondary | ICD-10-CM | POA: Diagnosis not present

## 2020-02-06 DIAGNOSIS — N183 Chronic kidney disease, stage 3 unspecified: Secondary | ICD-10-CM | POA: Diagnosis not present

## 2020-02-06 DIAGNOSIS — I5042 Chronic combined systolic (congestive) and diastolic (congestive) heart failure: Secondary | ICD-10-CM | POA: Diagnosis not present

## 2020-02-06 DIAGNOSIS — M1990 Unspecified osteoarthritis, unspecified site: Secondary | ICD-10-CM | POA: Diagnosis not present

## 2020-02-06 DIAGNOSIS — I4819 Other persistent atrial fibrillation: Secondary | ICD-10-CM | POA: Diagnosis not present

## 2020-02-06 DIAGNOSIS — N3001 Acute cystitis with hematuria: Secondary | ICD-10-CM | POA: Diagnosis not present

## 2020-02-06 DIAGNOSIS — G4700 Insomnia, unspecified: Secondary | ICD-10-CM | POA: Diagnosis not present

## 2020-02-06 DIAGNOSIS — N390 Urinary tract infection, site not specified: Secondary | ICD-10-CM | POA: Diagnosis not present

## 2020-02-06 DIAGNOSIS — Z7189 Other specified counseling: Secondary | ICD-10-CM | POA: Diagnosis not present

## 2020-02-06 DIAGNOSIS — E440 Moderate protein-calorie malnutrition: Secondary | ICD-10-CM | POA: Diagnosis not present

## 2020-02-06 DIAGNOSIS — I10 Essential (primary) hypertension: Secondary | ICD-10-CM | POA: Diagnosis not present

## 2020-02-06 DIAGNOSIS — K219 Gastro-esophageal reflux disease without esophagitis: Secondary | ICD-10-CM | POA: Diagnosis not present

## 2020-02-06 DIAGNOSIS — E785 Hyperlipidemia, unspecified: Secondary | ICD-10-CM | POA: Diagnosis not present

## 2020-02-06 DIAGNOSIS — I480 Paroxysmal atrial fibrillation: Secondary | ICD-10-CM | POA: Diagnosis not present

## 2020-02-06 DIAGNOSIS — R2681 Unsteadiness on feet: Secondary | ICD-10-CM | POA: Diagnosis not present

## 2020-02-06 LAB — BASIC METABOLIC PANEL
Anion gap: 9 (ref 5–15)
BUN: 15 mg/dL (ref 8–23)
CO2: 27 mmol/L (ref 22–32)
Calcium: 8.9 mg/dL (ref 8.9–10.3)
Chloride: 100 mmol/L (ref 98–111)
Creatinine, Ser: 0.89 mg/dL (ref 0.44–1.00)
GFR calc Af Amer: >60 mL/min (ref >60)
GFR calc non Af Amer: >60 mL/min (ref >60)
Glucose, Bld: 88 mg/dL (ref 70–99)
Potassium: 4.7 mmol/L (ref 3.5–5.1)
Sodium: 136 mmol/L (ref 135–145)

## 2020-02-06 LAB — SARS CORONAVIRUS 2 BY RT PCR (HOSPITAL ORDER, PERFORMED IN ~~LOC~~ HOSPITAL LAB): SARS Coronavirus 2: NEGATIVE

## 2020-02-06 MED ORDER — CEPHALEXIN 500 MG PO CAPS: 500.0000 mg | ORAL_CAPSULE | Freq: Three times a day (TID) | ORAL | 0 refills | Status: AC

## 2020-02-06 MED ORDER — LORAZEPAM 0.5 MG PO TABS: 0.5000 mg | ORAL_TABLET | Freq: Every day | ORAL | 0 refills | Status: DC | PRN

## 2020-02-06 MED ORDER — TRAMADOL HCL 50 MG PO TABS: 50.0000 mg | ORAL_TABLET | Freq: Four times a day (QID) | ORAL | 0 refills | Status: DC | PRN

## 2020-02-06 MED ORDER — POLYETHYLENE GLYCOL 3350 17 G PO PACK: 17.0000 g | PACK | Freq: Two times a day (BID) | ORAL | 0 refills | Status: DC

## 2020-02-06 MED ORDER — ZOLPIDEM TARTRATE 5 MG PO TABS: 5.0000 mg | ORAL_TABLET | Freq: Every day | ORAL | 0 refills | Status: DC

## 2020-02-06 MED ORDER — SENNOSIDES-DOCUSATE SODIUM 8.6-50 MG PO TABS: 2.0000 | ORAL_TABLET | Freq: Two times a day (BID) | ORAL | Status: DC

## 2020-02-06 MED ORDER — HYDROCORTISONE 1 % EX CREA: TOPICAL_CREAM | Freq: Three times a day (TID) | CUTANEOUS | 0 refills | Status: DC | PRN

## 2020-02-06 NOTE — Progress Notes (Signed)
Physical Therapy Treatment Patient Details Name: Cheryl Vargas MRN: [id number] DOB: June 01, 1935 Today's Date: 02/06/2020    History of Present Illness Pt is an 85 year old woman admitted from home with nausea, weakness and falls, + UTI, hypotension and dehydration. PMH: recent shingels, afib, CHF with EF of 20%, s/p AICD, CKDIII, arthritis.     PT Comments    Pt in bed upon arrival of PT, agreeable to PT session with focus on progression of functional mobility and stability. The pt was able to improve tolerance for sit-stand transfers and multiple short bouts of ambulation in room without use of AD, but continues to require assist for safety and stability due to deficits in strength, coordination, and dynamic stability. The pt will continue to benefit from skilled PT to further progress functional endurance for activity and independence as she was living home alone PTA.     Follow Up Recommendations  SNF;Supervision/Assistance - 24 hour     Equipment Recommendations  None recommended by PT    Recommendations for Other Services       Precautions / Restrictions Precautions Precautions: Fall Precaution Comments: pt reporting nausea today Restrictions Weight Bearing Restrictions: No    Mobility  Bed Mobility Overal bed mobility: Modified Independent             General bed mobility comments: increased time, HOB flat, no physical assist  Transfers Overall transfer level: Needs assistance Equipment used: None;Rolling walker (2 wheeled) Transfers: Sit to/from Stand Sit to Stand: Min guard         General transfer comment: pt able to complete multiple sit-stands through session, both with RW and with out AD. Pt with good use of BUE to push. reports she is unable to stand without arms  Ambulation/Gait Ambulation/Gait assistance: Min guard Gait Distance (Feet): 12 Feet(12 ft x 10 in room) Assistive device: Rolling walker (2 wheeled);None Gait Pattern/deviations:  Step-through pattern;Decreased stride length Gait velocity: slower Gait velocity interpretation: <1.8 ft/sec, indicate of risk for recurrent falls General Gait Details: Pt reporting too much nausea to leave room, completed multiple short bouts of ambulation in room with seated rest between. Pt able to complete forward and backward walking with and withoutRW, as well as lateral walking in each direction   Stairs             Wheelchair Mobility    Modified Rankin (Stroke Patients Only)       Balance Overall balance assessment: Needs assistance Sitting-balance support: Single extremity supported;No upper extremity supported Sitting balance-Leahy Scale: Normal     Standing balance support: Single extremity supported;No upper extremity supported Standing balance-Leahy Scale: Good Standing balance comment: able to stand and ambulate without AD, prefers RW             High level balance activites: Side stepping;Turns;Other (comment) High Level Balance Comments: backwards walking.            Cognition Arousal/Alertness: Awake/alert Behavior During Therapy: WFL for tasks assessed/performed Overall Cognitive Status: Within Functional Limits for tasks assessed                                 General Comments: Pt with reports of nausea upon arrival of PT, but agreeable to session, agreeable and cooperative with all activities             Pertinent Vitals/Pain Pain Assessment: Faces Faces Pain Scale: Hurts a little bit Pain Location: L  hip with lateral walking Pain Descriptors / Indicators: Sore Pain Intervention(s): Limited activity within patient's tolerance;Monitored during session    Home Living                      Prior Function            PT Goals (current goals can now be found in the care plan section) Acute Rehab PT Goals Patient Stated Goal: get stronger so she can manage at home independently PT Goal Formulation: With  patient Time For Goal Achievement: 02/10/20 Potential to Achieve Goals: Good Progress towards PT goals: Progressing toward goals    Frequency    Min 3X/week      PT Plan Current plan remains appropriate    Co-evaluation              AM-PAC PT "6 Clicks" Mobility   Outcome Measure  Help needed turning from your back to your side while in a flat bed without using bedrails?: None Help needed moving from lying on your back to sitting on the side of a flat bed without using bedrails?: None Help needed moving to and from a bed to a chair (including a wheelchair)?: A Little Help needed standing up from a chair using your arms (e.g., wheelchair or bedside chair)?: A Little Help needed to walk in hospital room?: A Little Help needed climbing 3-5 steps with a railing? : A Little 6 Click Score: 20    End of Session Equipment Utilized During Treatment: Gait belt Activity Tolerance: Patient tolerated treatment well(limited by nausea) Patient left: in bed;with call bell/phone within reach Nurse Communication: Mobility status PT Visit Diagnosis: Unsteadiness on feet (R26.81);Other abnormalities of gait and mobility (R26.89);Difficulty in walking, not elsewhere classified (R26.2)     Time: 8638-1771 PT Time Calculation (min) (ACUTE ONLY): 23 min  Charges:  $Gait Training: 23-37 mins                     Karma Ganja, PT, DPT   Acute Rehabilitation Department Pager #: [phone]   Otho Bellows 02/06/2020, 12:32 PM

## 2020-02-06 NOTE — Progress Notes (Signed)
Informed by the social worker that insurance denied patient's rehab stay.  Her peer-to-peer was requested.  I discussed with the medical director, Dr. Amalia Hailey.  After going through patient's clinical course, her comorbidities and her prior to hospitalization living condition and status, he has approved her for a 7-day stay in rehab.  Cheryl Vargas 02/06/2020

## 2020-02-06 NOTE — TOC Transition Note (Addendum)
Transition of Care [facility]) - CM/SW Discharge Note   Patient Details  Name: Cheryl Vargas MRN: [id number] Date of Birth: 07-20-1935  [facility] CM/SW Contact:  Bethann Berkshire, LCSW Phone Number: 02/06/2020, 2:16 PM   Clinical Narrative:     Patient will DC to: [facility]  Anticipated DC date: 02/06/20 Family notified:  [name] Transport by:  [name]   Per MD patient ready for DC to [facility] . RN, patient, patient's family, and facility notified of DC. Discharge Summary and FL2 sent to facility. RN to call report prior to discharge (Room [phone]). Pts son tim will transport. DC packet on chart.   CSW will sign off for now as social work intervention is no longer needed. Please consult [place] again if new needs arise.   Final next level of care: [place] Barriers to Discharge: No Barriers Identified   Patient Goals and CMS Choice Patient states their goals for this hospitalization and ongoing recovery are:: SNF      Discharge Placement              Patient chooses bed at: [facility] Patient to be transferred to facility by: Son [name] Name of family member notified: [name] Patient and family notified of of transfer: 02/06/20  Discharge Plan and Services                                     Social Determinants of Health (SDOH) Interventions     Readmission Risk Interventions No flowsheet data found.

## 2020-02-06 NOTE — Discharge Summary (Signed)
[facility]  Physician Discharge Summary   Patient ID: Cheryl Vargas MRN: [id number] DOB/AGE: [birth year] 83 y.o.  Admit date: 02/01/2020 Discharge date: 02/06/2020  PCP: Lavone Orn, MD  DISCHARGE DIAGNOSES:  Urinary tract infection with E. coli Chronic systolic and diastolic CHF Profound fatigue thought to be due to medications and hypotension Persistent atrial fibrillation Constipation   RECOMMENDATIONS FOR OUTPATIENT FOLLOW UP: 1. Patient will need follow-up with her cardiologist, Dr. Aundra Dubin. 2. Please check CBC and basic metabolic panel in 1 week. 3. Please monitor blood pressures while patient is getting bisoprolol.  If her systolic blood pressure is less than 100 then please discontinue this medication. 4. Palliative medicine to follow the patient at the skilled nursing facility    CODE STATUS: Full code  DISCHARGE CONDITION: fair  Diet recommendation: Heart healthy  INITIAL HISTORY: 85 year old lady with prior history of persistent atrial fibrillation, chronic systolic heart failure last EF noted to be around 20%, s/p AICD, stage IIIa CKD, arthritis presents with worsening nausea and weakness for over a month.  Of note she has been diagnosed with shingles on May 1 has been treated for 7 days with Valtrex.  She was also recently treated for urinary tract infection and nitrofurantoin for about 4 days presents to ED fir generalized weakness , nausea.  she was found to be hypotensive, dehydrated and has a  urinary tract infection.   HOSPITAL COURSE:   SIRS probably from UTI:  Patient was started on ceftriaxone.  Urine cultures growing E. coli, pansensitive.  Patient was changed over to cephalexin.  Her GI symptoms most likely due to UTI and constipation.  Abdomen is benign.  CT scan done on 5/25 did not show any acute findings.  She is tolerating her diet.  She remains afebrile.  WBC is normal.  Recent diagnosis of shingles:  Completed the course of  valtrex. Pain control.   Chronic systolic and diastolic heart failure:  She was thought to be dehydrated.  Her diuretics were held.   Echocardiogram from 09/2019 showed Left ventricular ejection fraction, by visual estimation, is <20%. The left ventricle has severely decreased function. There is no left  ventricular hypertrophy.  The left ventricle demonstrates global hypokinesis. Global right ventricle has mildly reduced systolic function.The right ventricular size is mildly enlarged. No increase in right ventricular wall thickness. S/p AICD. QTC is 0.35 Due to hypotension bisoprolol, losartan, furosemide and spironolactone were placed on hold. Patient mentions that she gets really fatigued when she takes these medications.  Her blood pressure did improve some.  At this time we will just plan to resume her bisoprolol.  Lasix to be taken only as needed depending on lower extremity edema and weight gain.  We will continue to hold the spironolactone and the ARB.  Blood pressures to be monitored closely at the skilled nursing facility.  She should follow-up with her cardiologist and a few weeks. Patient mentions that her quality of life is more important to her.  She was seen by palliative medicine here in the hospital.  They will need to continue to follow at the skilled nursing facility.    Persistent atrial fibrillation  Rate controlled on Digoxin.  On xarelto for anti coagulation.  CHA2DS2-VASc 2 score is 3.  Last echocardiogram reviewed.   Constipation She had a bowel movement after she was given an enema.  Continue with laxatives.  Hypotension:  Blood pressure noted to be soft.    Likely due to her cardiac medications.  Blood  pressure has improved some.  We will resume her bisoprolol but continue to hold her spironolactone and ARB.  Goals of care Due to multiple medical issues, poor functional status, deconditioning, palliative care consulted for goals of care. However she would like to  be a full code for now.  She however would want more of a focus on quality of life.  She wants to hold some of her cardiac medications due to the low blood pressure which can cause.  Moderate malnutrition Nutrition Problem: Moderate Malnutrition Etiology: chronic illness(CHF, CKD stage III)  Signs/Symptoms: mild fat depletion, moderate fat depletion, mild muscle depletion, moderate muscle depletion  Interventions: Ensure Enlive (each supplement provides 350kcal and 20 grams of protein)  Overall stable.  Continues to have some nausea but tolerating her diet.  Had a bowel movement with enema.  Vital signs stable.  Labs are stable.  Okay for discharge today.    PERTINENT LABS:  The results of significant diagnostics from this hospitalization (including imaging, microbiology, ancillary and laboratory) are listed below for reference.    Microbiology: Recent Results (from the past 240 hour(s))  SARS Coronavirus 2 by RT PCR (hospital order, performed in [facility] hospital lab) Nasopharyngeal Nasopharyngeal Swab     Status: None   Collection Time: 02/01/20  2:31 PM   Specimen: Nasopharyngeal Swab  Result Value Ref Range Status   SARS Coronavirus 2 NEGATIVE NEGATIVE Final    Comment: (NOTE) SARS-CoV-2 target nucleic acids are NOT DETECTED. The SARS-CoV-2 RNA is generally detectable in upper and lower respiratory specimens during the acute phase of infection. The lowest concentration of SARS-CoV-2 viral copies this assay can detect is 250 copies / mL. A negative result does not preclude SARS-CoV-2 infection and should not be used as the sole basis for treatment or other patient management decisions.  A negative result may occur with improper specimen collection / handling, submission of specimen other than nasopharyngeal swab, presence of viral mutation(s) within the areas targeted by this assay, and inadequate number of viral copies (<250 copies / mL). A negative result must be combined  with clinical observations, patient history, and epidemiological information. Fact Sheet for Patients:   StrictlyIdeas.no Fact Sheet for Healthcare Providers: BankingDealers.co.za This test is not yet approved or cleared  by the [place] FDA and has been authorized for detection and/or diagnosis of SARS-CoV-2 by FDA under an Emergency Use Authorization (EUA).  This EUA will remain in effect (meaning this test can be used) for the duration of the COVID-19 declaration under Section 564(b)(1) of the Act, 21 U.S.C. section 360bbb-3(b)(1), unless the authorization is terminated or revoked sooner. Performed at [facility], [place] [address] [place], [place] [address]   Culture, Urine     Status: Abnormal   Collection Time: 02/01/20  3:21 PM   Specimen: Urine, Random  Result Value Ref Range Status   Specimen Description URINE, RANDOM  Final   Special Requests   Final    NONE Performed at [facility], [place] [address] [place], [place] [address]    Culture >=100,000 COLONIES/mL ESCHERICHIA COLI (A)  Final   Report Status 02/04/2020 FINAL  Final   Organism ID, Bacteria ESCHERICHIA COLI (A)  Final      Susceptibility   Escherichia coli - MIC*    AMPICILLIN 8 SENSITIVE Sensitive     CEFAZOLIN <=4 SENSITIVE Sensitive     CEFTRIAXONE <=1 SENSITIVE Sensitive     CIPROFLOXACIN <=0.25 SENSITIVE Sensitive  GENTAMICIN <=1 SENSITIVE Sensitive     IMIPENEM <=0.25 SENSITIVE Sensitive     NITROFURANTOIN <=16 SENSITIVE Sensitive     TRIMETH/SULFA <=20 SENSITIVE Sensitive     AMPICILLIN/SULBACTAM 4 SENSITIVE Sensitive     PIP/TAZO <=4 SENSITIVE Sensitive     * >=100,000 COLONIES/mL ESCHERICHIA COLI  SARS Coronavirus 2 by RT PCR (hospital order, performed in [place] hospital lab) Nasopharyngeal Nasopharyngeal Swab     Status: None   Collection Time: 02/06/20  8:07 AM   Specimen: Nasopharyngeal Swab  Result Value Ref Range Status     SARS Coronavirus 2 NEGATIVE NEGATIVE Final    Comment: (NOTE) SARS-CoV-2 target nucleic acids are NOT DETECTED. The SARS-CoV-2 RNA is generally detectable in upper and lower respiratory specimens during the acute phase of infection. The lowest concentration of SARS-CoV-2 viral copies this assay can detect is 250 copies / mL. A negative result does not preclude SARS-CoV-2 infection and should not be used as the sole basis for treatment or other patient management decisions.  A negative result may occur with improper specimen collection / handling, submission of specimen other than nasopharyngeal swab, presence of viral mutation(s) within the areas targeted by this assay, and inadequate number of viral copies (<250 copies / mL). A negative result must be combined with clinical observations, patient history, and epidemiological information. Fact Sheet for Patients:   StrictlyIdeas.no Fact Sheet for Healthcare Providers: BankingDealers.co.za This test is not yet approved or cleared  by the [place] FDA and has been authorized for detection and/or diagnosis of SARS-CoV-2 by FDA under an Emergency Use Authorization (EUA).  This EUA will remain in effect (meaning this test can be used) for the duration of the COVID-19 declaration under Section 564(b)(1) of the Act, 21 U.S.C. section 360bbb-3(b)(1), unless the authorization is terminated or revoked sooner. Performed at [facility], [place] [address], [place], [place] [address]      Labs:  COVID-19 Labs  No results for input(s): DDIMER, FERRITIN, LDH, CRP in the last 72 hours.  Lab Results  Component Value Date   SARSCOV2NAA NEGATIVE 02/06/2020   [place] NEGATIVE 02/01/2020   [place] NEGATIVE 09/27/2019   [place] NEGATIVE 05/05/2019      Basic Metabolic Panel: Recent Labs  Lab 02/01/20 1327 02/02/20 0306 02/03/20 1203 02/04/20 0304 02/06/20 0222  NA 136  138 135 135 136  K 4.6 4.3 4.0 4.0 4.7  CL 100 106 105 106 100  CO2 23 21* 21* 21* 27  GLUCOSE 134* 91 114* 120* 88  BUN 19 17 16 12 15   CREATININE 1.15* 1.07* 0.93 0.87 0.89  CALCIUM 9.2 8.6* 8.8* 8.8* 8.9   Liver Function Tests: Recent Labs  Lab 02/01/20 1327 02/03/20 1203  AST 30 20  ALT 24 18  ALKPHOS 51 46  BILITOT 1.8* 0.6  PROT 6.8 6.3*  ALBUMIN 3.5 3.2*   Recent Labs  Lab 02/01/20 1327  LIPASE 27   CBC: Recent Labs  Lab 02/01/20 1327 02/02/20 0306 02/04/20 0304  WBC 7.0 6.0 5.6  NEUTROABS  --   --  3.5  HGB 16.4* 14.7 14.2  HCT 46.4* 41.1 39.4  MCV 98.1 95.8 95.6  PLT 213 170 162   CBG: Recent Labs  Lab 02/04/20 2233  GLUCAP 99     IMAGING STUDIES CT ABDOMEN PELVIS W CONTRAST  Result Date: 02/03/2020 CLINICAL DATA:  Nausea, vomiting, abdominal pain, loose bowel movements EXAM: CT ABDOMEN AND PELVIS WITH CONTRAST TECHNIQUE: Multidetector CT imaging of the  abdomen and pelvis was performed using the standard protocol following bolus administration of intravenous contrast. CONTRAST:  147mL OMNIPAQUE IOHEXOL 300 MG/ML  SOLN COMPARISON:  01/19/2020 FINDINGS: Lower chest: No acute pleural or parenchymal lung disease. Hepatobiliary: Stable hepatic cysts. The remainder of the liver and gallbladder are unremarkable. No change. Pancreas: Unremarkable. No pancreatic ductal dilatation or surrounding inflammatory changes. Spleen: Normal in size without focal abnormality. Adrenals/Urinary Tract: Mildly complex right renal cyst is unchanged. There are main der of the kidneys enhance normally. No urinary tract calculi or obstructive uropathy. Bladder is minimally distended with no focal abnormalities. The adrenals are normal. Stomach/Bowel: No bowel obstruction or ileus. Normal appendix right lower quadrant. There is diverticulosis of the sigmoid colon unchanged. Colon is decompressed, with no definite bowel wall thickening or inflammatory change. Vascular/Lymphatic: Aortic  atherosclerosis. No enlarged abdominal or pelvic lymph nodes. Reproductive: Uterus and bilateral adnexa are unremarkable. Other: Trace free fluid in the pelvis is nonspecific. No free intraperitoneal gas. No abdominal wall hernia. Musculoskeletal: Left hip arthroplasty unchanged. There are no acute or destructive bony lesions. IMPRESSION: 1. Trace pelvic free fluid is nonspecific. 2. Stable diverticulosis of the sigmoid colon. Colon is decompressed which limits evaluation, without definitive signs of diverticulitis on today's study. 3. Stable mildly complex right renal cyst. 4.  Aortic Atherosclerosis (ICD10-I70.0). Electronically Signed   By: Randa Ngo M.D.   On: 02/03/2020 16:48   CT ABDOMEN PELVIS W CONTRAST  Result Date: 01/19/2020 CLINICAL DATA:  85 year old female with left lower quadrant abdominal pain, left flank pain for 2 weeks. Nausea. EXAM: CT ABDOMEN AND PELVIS WITH CONTRAST TECHNIQUE: Multidetector CT imaging of the abdomen and pelvis was performed using the standard protocol following bolus administration of intravenous contrast. CONTRAST:  138mL ISOVUE-300 IOPAMIDOL (ISOVUE-300) INJECTION 61% COMPARISON:  CT Abdomen and Pelvis 09/17/2017. FINDINGS: Lower chest: Chronic cardiomegaly with pacer/AICD leads. Cardiac chamber size appears somewhat increased since 2019 (series 2, image 14 today versus series 3, image 5 previously) but there is no pericardial effusion. Bilateral pleural effusions have largely resolved with only trace residual. There is minor lung base atelectasis. Hepatobiliary: Stable occasional small and benign hypodensities in the liver, such as the 10 mm hypodensity in the right hepatic lobe on series 11, image 25. negative liver and gallbladder. Pancreas: The pancreatic duct is at the upper limits of normal, but there is no evidence of pancreatic mass or inflammation. Spleen: Negative. Adrenals/Urinary Tract: Normal adrenal glands. Chronic large right upper pole exophytic cyst  with a thin chronic septation has not significantly changed since 2019 now measuring up to 87 mm diameter. Otherwise symmetric and normal renal contrast enhancement and excretion. No nephrolithiasis, hydronephrosis, and proximal ureters appear decompressed. Diminutive urinary bladder with detail limited due to pelvis streak artifact. Although there is trace gas within the bladder lumen on series 8, image 108. There are chronic pelvic phleboliths. Stomach/Bowel: Negative rectum. Sigmoid diverticulosis with detail of the sigmoid colon limited by pelvis streak artifact, but suggestion of mild sigmoid mesentery inflammatory stranding on coronal image 80. No free air or definite extraluminal fluid identified. Negative descending colon. Redundant splenic flexure and transverse colon with occasional diverticula. Redundant hepatic flexure. No other large bowel inflammation. Normal appendix visible on coronal image 46. Decompressed and negative terminal ileum. No dilated small bowel. Largely decompressed stomach and duodenum. No free air or free fluid identified in the abdomen. Vascular/Lymphatic: Aortoiliac calcified atherosclerosis. Major arterial structures in the abdomen and pelvis are patent. Portal venous system appears patent on the  delayed images. No lymphadenopathy. Reproductive: Streak artifact in the pelvis related to left hip arthroplasty. The uterus and adnexa appear to be diminutive or surgically absent. Other: No pelvic free fluid. Musculoskeletal: Chronic scoliosis and degenerative changes in the spine. Chronic left hip arthroplasty. No acute osseous abnormality identified. IMPRESSION: 1. Trace gas within the bladder lumen, suspicious for UTI unless explained by recent catheterization. No urinary calculus identified, no obstructive uropathy. 2. Also it is difficult to exclude Mild Acute Sigmoid Diverticulitis. There is extensive chronic sigmoid diverticulosis, with detail of the sigmoid colon limited due to  left hip replacement streak artifact. 3. No other acute or inflammatory process identified in the abdomen or pelvis. 4. Chronic cardiomegaly appears progressed since 2019. 5. Aortic Atherosclerosis (ICD10-I70.0). Chronic large right upper pole renal cyst. Electronically Signed   By: Genevie Ann M.D.   On: 01/19/2020 13:53   DG Chest Port 1 View  Result Date: 02/01/2020 CLINICAL DATA:  Shortness of breath EXAM: PORTABLE CHEST 1 VIEW COMPARISON:  September 27, 2019 FINDINGS: Stable pacemaker and cardiomegaly. The hila and mediastinum are normal. No pneumothorax. No pulmonary nodules or masses. No focal infiltrates. IMPRESSION: No active disease. Electronically Signed   By: Dorise Bullion III M.D   On: 02/01/2020 14:30    DISCHARGE EXAMINATION: Vitals:   02/05/20 2026 02/06/20 0500 02/06/20 0818 02/06/20 0819  BP: (!) 120/59   (!) 122/53  Pulse: 79  84 81  Resp: 16     Temp: 98.1 F (36.7 C)   98.1 F (36.7 C)  TempSrc: Oral     SpO2: 98%   98%  Weight:  61.2 kg    Height:       General appearance: Awake alert.  In no distress Resp: Clear to auscultation bilaterally.  Normal effort Cardio: S1-S2 is normal regular.  No S3-S4.  No rubs murmurs or bruit GI: Abdomen is soft.  Nontender nondistended.  Bowel sounds are present normal.  No masses organomegaly Extremities: No edema.  Full range of motion of lower extremities. Neurologic: Alert and oriented x3.  No focal neurological deficits.    DISPOSITION: SNF  Discharge Instructions    (HEART FAILURE PATIENTS) Call MD:  Anytime you have any of the following symptoms: 1) 3 pound weight gain in 24 hours or 5 pounds in 1 week 2) shortness of breath, with or without a dry hacking cough 3) swelling in the hands, feet or stomach 4) if you have to sleep on extra pillows at night in order to breathe.   Complete by: As directed    Call MD for:  difficulty breathing, headache or visual disturbances   Complete by: As directed    Call MD for:  extreme  fatigue   Complete by: As directed    Call MD for:  persistant dizziness or light-headedness   Complete by: As directed    Call MD for:  persistant nausea and vomiting   Complete by: As directed    Call MD for:  severe uncontrolled pain   Complete by: As directed    Call MD for:  temperature >100.4   Complete by: As directed    Discharge instructions   Complete by: As directed    Please review instructions on the discharge summary.  You were cared for by a hospitalist during your hospital stay. If you have any questions about your discharge medications or the care you received while you were in the hospital after you are discharged, you can call the unit  and asked to speak with the hospitalist on call if the hospitalist that took care of you is not available. Once you are discharged, your primary care physician will handle any further medical issues. Please note that NO REFILLS for any discharge medications will be authorized once you are discharged, as it is imperative that you return to your primary care physician (or establish a relationship with a primary care physician if you do not have one) for your aftercare needs so that they can reassess your need for medications and monitor your lab values. If you do not have a primary care physician, you can call [phone] for a physician referral.   Increase activity slowly   Complete by: As directed          Allergies as of 02/06/2020      Reactions   Sotalol Other (See Comments)   Prolonged QTc   Alendronate Sodium Other (See Comments)   Arm pain   Ciprofloxacin Other (See Comments)   Not effective   Compazine [prochlorperazine]    Coreg [carvedilol] Other (See Comments)   Fatigue   Metoprolol Other (See Comments)   Profound fatigue      Medication List    STOP taking these medications   losartan 25 MG tablet Commonly known as: COZAAR   nitrofurantoin (macrocrystal-monohydrate) 100 MG capsule Commonly known as: MACROBID     spironolactone 25 MG tablet Commonly known as: ALDACTONE     TAKE these medications   acetaminophen 500 MG tablet Commonly known as: TYLENOL Take 500 mg by mouth every 6 (six) hours as needed for headache.   bisoprolol 5 MG tablet Commonly known as: ZEBETA Take 0.5 mg by mouth daily.   cephALEXin 500 MG capsule Commonly known as: KEFLEX Take 1 capsule (500 mg total) by mouth every 8 (eight) hours for 2 days.   digoxin 0.125 MG tablet Commonly known as: LANOXIN Take 0.5 tablets (0.0625 mg total) by mouth daily.   famotidine 20 MG tablet Commonly known as: PEPCID Take 1 tablet (20 mg total) by mouth daily. What changed:   when to take this  reasons to take this   fluticasone 50 MCG/ACT nasal spray Commonly known as: FLONASE Place 1 spray into both nostrils daily as needed for allergies or rhinitis.   furosemide 20 MG tablet Commonly known as: LASIX Take 1 tablet (20 mg total) by mouth every other day. What changed:   when to take this  reasons to take this   hydrocortisone cream 1 % Apply topically 3 (three) times daily as needed for itching.   LORazepam 0.5 MG tablet Commonly known as: ATIVAN Take 1 tablet (0.5 mg total) by mouth daily as needed (nausea).   polyethylene glycol 17 g packet Commonly known as: MIRALAX / GLYCOLAX Take 17 g by mouth 2 (two) times daily.   Rivaroxaban 15 MG Tabs tablet Commonly known as: XARELTO Take 1 tablet (15 mg total) by mouth daily with supper.   senna-docusate 8.6-50 MG tablet Commonly known as: Senokot-S Take 2 tablets by mouth 2 (two) times daily.   traMADol 50 MG tablet Commonly known as: ULTRAM Take 1 tablet (50 mg total) by mouth every 6 (six) hours as needed for moderate pain.   VITAMIN D PO Take 1 tablet by mouth daily.   zolpidem 5 MG tablet Commonly known as: AMBIEN Take 1 tablet (5 mg total) by mouth at bedtime.        Follow-up Information    Lavone Orn, MD. Schedule  an appointment as soon  as possible for a visit in 2 week(s).   Specialty: Internal Medicine Contact information: [address] [place] [address] [phone]        Larey Dresser, MD. Schedule an appointment as soon as possible for a visit in 2 week(s).   Specialty: Cardiology Contact information: [address] [address] [phone]           TOTAL DISCHARGE TIME: 35 minutes  [place]  [facility] Pager on [email or web address]  02/06/2020, 10:58 AM

## 2020-02-10 ENCOUNTER — Other Ambulatory Visit: Payer: Self-pay

## 2020-02-10 ENCOUNTER — Non-Acute Institutional Stay: Payer: PPO | Admitting: Internal Medicine

## 2020-02-10 DIAGNOSIS — Z7189 Other specified counseling: Secondary | ICD-10-CM | POA: Diagnosis not present

## 2020-02-10 DIAGNOSIS — I4819 Other persistent atrial fibrillation: Secondary | ICD-10-CM | POA: Diagnosis not present

## 2020-02-10 DIAGNOSIS — I5022 Chronic systolic (congestive) heart failure: Secondary | ICD-10-CM | POA: Diagnosis not present

## 2020-02-10 DIAGNOSIS — Z515 Encounter for palliative care: Secondary | ICD-10-CM | POA: Diagnosis not present

## 2020-02-10 DIAGNOSIS — M1990 Unspecified osteoarthritis, unspecified site: Secondary | ICD-10-CM | POA: Diagnosis not present

## 2020-02-10 DIAGNOSIS — Z95810 Presence of automatic (implantable) cardiac defibrillator: Secondary | ICD-10-CM | POA: Diagnosis not present

## 2020-02-10 DIAGNOSIS — N183 Chronic kidney disease, stage 3 unspecified: Secondary | ICD-10-CM | POA: Diagnosis not present

## 2020-02-10 DIAGNOSIS — K219 Gastro-esophageal reflux disease without esophagitis: Secondary | ICD-10-CM | POA: Diagnosis not present

## 2020-02-10 DIAGNOSIS — F419 Anxiety disorder, unspecified: Secondary | ICD-10-CM | POA: Diagnosis not present

## 2020-02-10 NOTE — Progress Notes (Signed)
June 1st, 2021 [place] Consult Note Telephone: [phone]  Fax: [phone]  PATIENT NAME: Cheryl Vargas DOB: Jul 06, 1935 MRN: [id number]  Cheryl Vargas RM [id number] (adm 02/06/2020)  PRIMARY CARE PROVIDER: Lavone Orn, MD [address],  [place] [address]  Dr. [name] (cardiologist)   REFERRING PROVIDER: Gaston Islam, NP/Dr. Seward Carol  RESPONSIBLE PARTY:   Dtr Courtney Paris [phone] [name] [phone] *Son/HCPOA [name] [phone] [facility])  ASSESSMENT / RECOMMENDATIONS:  1. Advance Care Planning: A. Directives: Reviewed and discussed with patient. She is currently a full code but is reconsidering this. We discussed that withholding of CPR is only in the event of an absence of BOTH heartbeat AND respirations. That short of this she would be availed of supportive medical care. Patient plans to discuss this further with her son [name] who is her HCPOA and is planning to visit this weekend. Her driving restrictions (6 month d/t firing of AICD Jan of this year) will be up in July and she is really looking forward to driving again. B. Goals of Care:  Hoping to go home after this weekend to be with her two [employer] and Patty.    2. Cognitive / Functional Assessment; Symptom Management: Patient is A & O x 3. She has a good handle on her medical condition, and her medications. Lived independently (occasional use of walker) and was independent in all ADLs before her bout of shingles and subsequent dehydration (nausea/decreased oral intake/BP meds/UTI/constipation) early March. Admitted to rehab just before the [place] weekend and is due to start physical therapy today. Currently able to independently transfer and ambulate (walker) to her bathroom. She is continent of bowel and bladder.  Pain of shingles (L lower anterior abd extending around L side to spine) managed with prn  Tramadol and Tylenol. Vesicles are resolved; residual scaring. She spoke to facility PA this am and he's going to order Lidocaine patch as well. She hasn't been asking for her Tramadol because nsg staff need to stagger this from her Ativan dosing, and she doesn't want to interfere with taking her prn Ativan for her nausea. She feels her pain is "bearable". Waxes and wanes in intensity.  -consider topical capsaicin cream H/O asymptomatic UTIs.  -consider addition of Cranberry extract 500mg  bid  Nausea (for a few months) of uncertain etiology, currently managed with Ativan tid prn. She is unable to take Zofran d/t potential for QTC prolongation. On Pepcid. She's been losing weight; abot 8-10 lbs over the last few months. Her current weight is 126.4lbs. At a height of 5'5" her BMI is 21kg/m2.  We discussed an appetite stimulant, but she defers. Believes she will eat better when she is discharged to home. Dislikes oral supplements ("too heavy") but will ask staff for ice cream if unable to consume meals.   -consider outpatient f/u GI consult  CHF (combined systolic/diastolic; EF < 16% recent echo): Appears currently compensated by exam. Has AICD in place; last discharge Jan of this yr. Her Aldactone, Losartan, and Metoprolol is on hold for now. She is on a decreased dose of her bisoprolol. Continues Digoxin. Lasix is now qod.   -plans f/u with Cardiology   Insomnia: Ambien qhs.  4. Family Supports:  Widowed 5 years ago (spouse was under hospice care; lung cancer with brain mets). Patient is a retired Marine scientist. She has 3 very supportive children. [name] and Tim live in [place] (they have  been visiting frequently and take care of patient's cats while she was in hospital/current rehab). Patient lives in top floor (14th) [place] ([place] Gets her groceries via [place]. Can't drive till mid July (d/t AICD discharge Jan 2021). Patient's son [name] is her HCPOA. He is a Paramedic in  [place]. [name] is planning on visiting this weekend to assess patient's potential to go home with supports. He has been in contact with PCS RN case manager through [place] (? sp). They may be able to supply 20 hrs/week custodial care. Case manager is supposesed to meet with family this Mon at 11:30am. [name] has also located a private pay aide to come in 3hr/day.   5. Follow up Palliative Care Visit:  I spent 60 minutes providing this consultation from 1pm to 2pm. More than 50% of the time in this consultation was spent coordinating communication.   HISTORY OF PRESENT ILLNESS:  Cheryl Vargas is a 85 y.o. year old female with h/o CHF (combined systolic/disatolic; EF < 25% echo Jan 2021), afib, AICD (after cardiac arrest 2018. Last discharge Jan 2021), stage 3a CKD, arthritis, shingles May 2021, UTI.    -5/23-5/28/2021: hospitalized and treated for SIRS (2/2)I, CHF, profound fatigue 2/2 medications/hypotension, constipation, atrial fibrillation  Palliative Care was asked to help address goals of care.   CODE STATUS:   PPS: weak 60%  HOSPICE ELIGIBILITY/DIAGNOSIS: TBD  PAST MEDICAL HISTORY:  Past Medical History:  Diagnosis Date  . Arthritis   . Chronic combined systolic (congestive) and diastolic (congestive) heart failure ([place])   . Mitral regurgitation   . Non-ischemic cardiomyopathy ([place])   . Persistent atrial fibrillation ([place])   . Ventricular tachycardia ([place])     SOCIAL HX:  Social History   Tobacco Use  . Smoking status: Never Smoker  . Smokeless tobacco: Never Used  Substance Use Topics  . Alcohol use: Yes    Alcohol/week: 3.0 standard drinks    Types: 3 Glasses of wine per week    Comment: per week    ALLERGIES:  Allergies  Allergen Reactions  . Sotalol Other (See Comments)    Prolonged QTc  . Alendronate Sodium Other (See Comments)    Arm pain  . Ciprofloxacin Other (See Comments)    Not effective  . Compazine [Prochlorperazine]   . Coreg  [Carvedilol] Other (See Comments)    Fatigue   . Metoprolol Other (See Comments)    Profound fatigue     PERTINENT MEDICATIONS:  Outpatient Encounter Medications as of 02/10/2020  Medication Sig  . acetaminophen (TYLENOL) 500 MG tablet Take 500 mg by mouth every 6 (six) hours as needed for headache.  . bisoprolol (ZEBETA) 5 MG tablet Take 0.5 mg by mouth daily.  . digoxin (LANOXIN) 0.125 MG tablet Take 0.5 tablets (0.0625 mg total) by mouth daily.  . famotidine (PEPCID) 20 MG tablet Take 1 tablet (20 mg total) by mouth daily. (Patient taking differently: Take 20 mg by mouth daily as needed for heartburn. )  . fluticasone (FLONASE) 50 MCG/ACT nasal spray Place 1 spray into both nostrils daily as needed for allergies or rhinitis.   . furosemide (LASIX) 20 MG tablet Take 1 tablet (20 mg total) by mouth every other day. (Patient taking differently: Take 20 mg by mouth every other day as needed for fluid. )  . hydrocortisone cream 1 % Apply topically 3 (three) times daily as needed for itching.  Marland Kitchen LORazepam (ATIVAN) 0.5 MG tablet Take 1 tablet (  0.5 mg total) by mouth daily as needed (nausea). (Patient taking differently: Take 0.5 mg by mouth every 8 (eight) hours as needed (nausea). )  . polyethylene glycol (MIRALAX / GLYCOLAX) 17 g packet Take 17 g by mouth 2 (two) times daily.  . Rivaroxaban (XARELTO) 15 MG TABS tablet Take 1 tablet (15 mg total) by mouth daily with supper.  . senna-docusate (SENOKOT-S) 8.6-50 MG tablet Take 2 tablets by mouth 2 (two) times daily.  . traMADol (ULTRAM) 50 MG tablet Take 1 tablet (50 mg total) by mouth every 6 (six) hours as needed for moderate pain.  Marland Kitchen VITAMIN D PO Take 1 tablet by mouth daily.   Marland Kitchen zolpidem (AMBIEN) 5 MG tablet Take 1 tablet (5 mg total) by mouth at bedtime.   No facility-administered encounter medications on file as of 02/10/2020.    PHYSICAL EXAM:   Pleasantly conversant, lying with HOB elevated in bed.  General: NAD, frail appearing,  slender Cardiovascular: regular rate and rhythm Pulmonary: clear ant fields, soft insp crackles LL lung field Abdomen: soft, nontender, + bowel sounds GU: no suprapubic tenderness Extremities: no edema, no joint deformities Skin: scattered scars of resolving shingles Left lower abd around L side to L flank Neurological: Weakness but otherwise nonfocal  Julianne Handler, NP

## 2020-02-11 ENCOUNTER — Encounter: Payer: Self-pay | Admitting: Internal Medicine

## 2020-02-11 DIAGNOSIS — I4891 Unspecified atrial fibrillation: Secondary | ICD-10-CM | POA: Diagnosis not present

## 2020-02-11 DIAGNOSIS — K5900 Constipation, unspecified: Secondary | ICD-10-CM | POA: Diagnosis not present

## 2020-02-11 DIAGNOSIS — I10 Essential (primary) hypertension: Secondary | ICD-10-CM | POA: Diagnosis not present

## 2020-02-11 DIAGNOSIS — N390 Urinary tract infection, site not specified: Secondary | ICD-10-CM | POA: Diagnosis not present

## 2020-02-16 ENCOUNTER — Telehealth (HOSPITAL_COMMUNITY): Payer: Self-pay | Admitting: *Deleted

## 2020-02-16 ENCOUNTER — Ambulatory Visit (INDEPENDENT_AMBULATORY_CARE_PROVIDER_SITE_OTHER): Payer: PPO

## 2020-02-16 DIAGNOSIS — I4821 Permanent atrial fibrillation: Secondary | ICD-10-CM | POA: Diagnosis not present

## 2020-02-16 DIAGNOSIS — Z7901 Long term (current) use of anticoagulants: Secondary | ICD-10-CM | POA: Diagnosis not present

## 2020-02-16 DIAGNOSIS — Z95810 Presence of automatic (implantable) cardiac defibrillator: Secondary | ICD-10-CM

## 2020-02-16 DIAGNOSIS — Z9181 History of falling: Secondary | ICD-10-CM | POA: Diagnosis not present

## 2020-02-16 DIAGNOSIS — M19079 Primary osteoarthritis, unspecified ankle and foot: Secondary | ICD-10-CM | POA: Diagnosis not present

## 2020-02-16 DIAGNOSIS — I442 Atrioventricular block, complete: Secondary | ICD-10-CM | POA: Diagnosis not present

## 2020-02-16 DIAGNOSIS — M8580 Other specified disorders of bone density and structure, unspecified site: Secondary | ICD-10-CM | POA: Diagnosis not present

## 2020-02-16 DIAGNOSIS — I050 Rheumatic mitral stenosis: Secondary | ICD-10-CM | POA: Diagnosis not present

## 2020-02-16 DIAGNOSIS — I5022 Chronic systolic (congestive) heart failure: Secondary | ICD-10-CM | POA: Diagnosis not present

## 2020-02-16 DIAGNOSIS — Z96642 Presence of left artificial hip joint: Secondary | ICD-10-CM | POA: Diagnosis not present

## 2020-02-16 DIAGNOSIS — E7800 Pure hypercholesterolemia, unspecified: Secondary | ICD-10-CM | POA: Diagnosis not present

## 2020-02-16 DIAGNOSIS — I428 Other cardiomyopathies: Secondary | ICD-10-CM | POA: Diagnosis not present

## 2020-02-16 DIAGNOSIS — I5042 Chronic combined systolic (congestive) and diastolic (congestive) heart failure: Secondary | ICD-10-CM | POA: Diagnosis not present

## 2020-02-16 DIAGNOSIS — G4700 Insomnia, unspecified: Secondary | ICD-10-CM | POA: Diagnosis not present

## 2020-02-16 DIAGNOSIS — Z87440 Personal history of urinary (tract) infections: Secondary | ICD-10-CM | POA: Diagnosis not present

## 2020-02-16 DIAGNOSIS — I110 Hypertensive heart disease with heart failure: Secondary | ICD-10-CM | POA: Diagnosis not present

## 2020-02-16 NOTE — Telephone Encounter (Signed)
Pt aware and thanked me for the call.  

## 2020-02-16 NOTE — Telephone Encounter (Signed)
OK for Reglan.

## 2020-02-16 NOTE — Telephone Encounter (Signed)
Pt called wanting to know if its ok to take Reglan for nausea. She wants to make sure it does not interfere with her other medications.   Routed to [place] for advice

## 2020-02-17 DIAGNOSIS — N390 Urinary tract infection, site not specified: Secondary | ICD-10-CM | POA: Diagnosis not present

## 2020-02-17 DIAGNOSIS — I480 Paroxysmal atrial fibrillation: Secondary | ICD-10-CM | POA: Diagnosis not present

## 2020-02-17 NOTE — Progress Notes (Signed)
EPIC Encounter for ICM Monitoring  Patient Name: Cheryl Vargas is a 85 y.o. female Date: 02/17/2020 Primary Care Physican: Lavone Orn, MD Primary Cardiologist:McLean Electrophysiologist:Allred Bi-V Pacing:98.5% 6/8/2021Weight:126.5lbs    Spoke with patient. She reports being in hospital and physician changed a lot of her medications.  She said she is feeling better but is still very weak.   OptivolThoracic impedancereturned to normal after resuming prescribed Furosemide dosage of 20 mg every other day.  Prescribed: Furosemide20 mg Take 20 mg by mouth every other day as needed for fluid.    Labs: 02/06/2020 Creatinine 0.89, BUN 15, Potassium 4.7, Sodium 136, GFR >60 02/04/2020 Creatinine 0.87, BUN 12, Potassium 4.0, Sodium 135, GFR >60  02/03/2020 Creatinine 0.93, BUN 16, Potassium 4.0, Sodium 135, GFR 57->60  02/02/2020 Creatinine 1.07, BUN 17, Potassium 4.3, Sodium 138, GFR 48-56  02/01/2020 Creatinine 1.15, BUN 19, Potassium 4.6, Sodium 136, GFR 44-51  01/15/2020 Creatinine 1.04, BUN 16, Potassium 4.1, Sodium 136, GFR 50-58  A complete set of results can be found in Results Review.  Recommendations:No changes and encouraged to call if experiencing any fluid symptoms.  Follow-up plan: ICM clinic phone appointment on7/08/2020.91 day device clinic remote transmission6/30/2021. Office appton 03/05/2020 with Dr Aundra Dubin.   Copy of ICM check sent to Dr.Allred.  3 month ICM trend: 02/16/2020    1 Year ICM trend:       Rosalene Billings, RN 02/17/2020 4:32 PM

## 2020-02-25 DIAGNOSIS — Z96642 Presence of left artificial hip joint: Secondary | ICD-10-CM | POA: Diagnosis not present

## 2020-02-25 DIAGNOSIS — I110 Hypertensive heart disease with heart failure: Secondary | ICD-10-CM | POA: Diagnosis not present

## 2020-02-25 DIAGNOSIS — I428 Other cardiomyopathies: Secondary | ICD-10-CM | POA: Diagnosis not present

## 2020-02-25 DIAGNOSIS — Z7901 Long term (current) use of anticoagulants: Secondary | ICD-10-CM | POA: Diagnosis not present

## 2020-02-25 DIAGNOSIS — I442 Atrioventricular block, complete: Secondary | ICD-10-CM | POA: Diagnosis not present

## 2020-02-25 DIAGNOSIS — M8580 Other specified disorders of bone density and structure, unspecified site: Secondary | ICD-10-CM | POA: Diagnosis not present

## 2020-02-25 DIAGNOSIS — I050 Rheumatic mitral stenosis: Secondary | ICD-10-CM | POA: Diagnosis not present

## 2020-02-25 DIAGNOSIS — Z9181 History of falling: Secondary | ICD-10-CM | POA: Diagnosis not present

## 2020-02-25 DIAGNOSIS — M19079 Primary osteoarthritis, unspecified ankle and foot: Secondary | ICD-10-CM | POA: Diagnosis not present

## 2020-02-25 DIAGNOSIS — G4700 Insomnia, unspecified: Secondary | ICD-10-CM | POA: Diagnosis not present

## 2020-02-25 DIAGNOSIS — I4821 Permanent atrial fibrillation: Secondary | ICD-10-CM | POA: Diagnosis not present

## 2020-02-25 DIAGNOSIS — Z87440 Personal history of urinary (tract) infections: Secondary | ICD-10-CM | POA: Diagnosis not present

## 2020-02-25 DIAGNOSIS — I5042 Chronic combined systolic (congestive) and diastolic (congestive) heart failure: Secondary | ICD-10-CM | POA: Diagnosis not present

## 2020-02-25 DIAGNOSIS — E7800 Pure hypercholesterolemia, unspecified: Secondary | ICD-10-CM | POA: Diagnosis not present

## 2020-02-26 DIAGNOSIS — G4700 Insomnia, unspecified: Secondary | ICD-10-CM | POA: Diagnosis not present

## 2020-02-26 DIAGNOSIS — I050 Rheumatic mitral stenosis: Secondary | ICD-10-CM | POA: Diagnosis not present

## 2020-02-26 DIAGNOSIS — R110 Nausea: Secondary | ICD-10-CM | POA: Diagnosis not present

## 2020-02-26 DIAGNOSIS — F5101 Primary insomnia: Secondary | ICD-10-CM | POA: Diagnosis not present

## 2020-02-26 DIAGNOSIS — R627 Adult failure to thrive: Secondary | ICD-10-CM | POA: Diagnosis not present

## 2020-02-26 DIAGNOSIS — Z9181 History of falling: Secondary | ICD-10-CM | POA: Diagnosis not present

## 2020-02-26 DIAGNOSIS — M19079 Primary osteoarthritis, unspecified ankle and foot: Secondary | ICD-10-CM | POA: Diagnosis not present

## 2020-02-26 DIAGNOSIS — I4821 Permanent atrial fibrillation: Secondary | ICD-10-CM | POA: Diagnosis not present

## 2020-02-26 DIAGNOSIS — K5900 Constipation, unspecified: Secondary | ICD-10-CM | POA: Diagnosis not present

## 2020-02-26 DIAGNOSIS — Z96642 Presence of left artificial hip joint: Secondary | ICD-10-CM | POA: Diagnosis not present

## 2020-02-26 DIAGNOSIS — Z7901 Long term (current) use of anticoagulants: Secondary | ICD-10-CM | POA: Diagnosis not present

## 2020-02-26 DIAGNOSIS — I5042 Chronic combined systolic (congestive) and diastolic (congestive) heart failure: Secondary | ICD-10-CM | POA: Diagnosis not present

## 2020-02-26 DIAGNOSIS — E7800 Pure hypercholesterolemia, unspecified: Secondary | ICD-10-CM | POA: Diagnosis not present

## 2020-02-26 DIAGNOSIS — I442 Atrioventricular block, complete: Secondary | ICD-10-CM | POA: Diagnosis not present

## 2020-02-26 DIAGNOSIS — Z7189 Other specified counseling: Secondary | ICD-10-CM | POA: Diagnosis not present

## 2020-02-26 DIAGNOSIS — I428 Other cardiomyopathies: Secondary | ICD-10-CM | POA: Diagnosis not present

## 2020-02-26 DIAGNOSIS — M8580 Other specified disorders of bone density and structure, unspecified site: Secondary | ICD-10-CM | POA: Diagnosis not present

## 2020-02-26 DIAGNOSIS — Z87440 Personal history of urinary (tract) infections: Secondary | ICD-10-CM | POA: Diagnosis not present

## 2020-02-26 DIAGNOSIS — I110 Hypertensive heart disease with heart failure: Secondary | ICD-10-CM | POA: Diagnosis not present

## 2020-02-27 ENCOUNTER — Telehealth: Payer: Self-pay | Admitting: Internal Medicine

## 2020-02-27 DIAGNOSIS — I428 Other cardiomyopathies: Secondary | ICD-10-CM | POA: Diagnosis not present

## 2020-02-27 DIAGNOSIS — Z7901 Long term (current) use of anticoagulants: Secondary | ICD-10-CM | POA: Diagnosis not present

## 2020-02-27 DIAGNOSIS — G4700 Insomnia, unspecified: Secondary | ICD-10-CM | POA: Diagnosis not present

## 2020-02-27 DIAGNOSIS — M19079 Primary osteoarthritis, unspecified ankle and foot: Secondary | ICD-10-CM | POA: Diagnosis not present

## 2020-02-27 DIAGNOSIS — Z96642 Presence of left artificial hip joint: Secondary | ICD-10-CM | POA: Diagnosis not present

## 2020-02-27 DIAGNOSIS — E7800 Pure hypercholesterolemia, unspecified: Secondary | ICD-10-CM | POA: Diagnosis not present

## 2020-02-27 DIAGNOSIS — I050 Rheumatic mitral stenosis: Secondary | ICD-10-CM | POA: Diagnosis not present

## 2020-02-27 DIAGNOSIS — I442 Atrioventricular block, complete: Secondary | ICD-10-CM | POA: Diagnosis not present

## 2020-02-27 DIAGNOSIS — I4821 Permanent atrial fibrillation: Secondary | ICD-10-CM | POA: Diagnosis not present

## 2020-02-27 DIAGNOSIS — M8580 Other specified disorders of bone density and structure, unspecified site: Secondary | ICD-10-CM | POA: Diagnosis not present

## 2020-02-27 DIAGNOSIS — Z87440 Personal history of urinary (tract) infections: Secondary | ICD-10-CM | POA: Diagnosis not present

## 2020-02-27 DIAGNOSIS — Z9181 History of falling: Secondary | ICD-10-CM | POA: Diagnosis not present

## 2020-02-27 DIAGNOSIS — I5042 Chronic combined systolic (congestive) and diastolic (congestive) heart failure: Secondary | ICD-10-CM | POA: Diagnosis not present

## 2020-02-27 DIAGNOSIS — I110 Hypertensive heart disease with heart failure: Secondary | ICD-10-CM | POA: Diagnosis not present

## 2020-02-27 NOTE — Telephone Encounter (Signed)
[facility] Palliative visit scheduled for 03-01-20 at 3:00.

## 2020-03-01 ENCOUNTER — Other Ambulatory Visit: Payer: PPO | Admitting: Internal Medicine

## 2020-03-01 ENCOUNTER — Other Ambulatory Visit: Payer: Self-pay

## 2020-03-01 ENCOUNTER — Encounter: Payer: Self-pay | Admitting: Internal Medicine

## 2020-03-01 DIAGNOSIS — Z7189 Other specified counseling: Secondary | ICD-10-CM | POA: Diagnosis not present

## 2020-03-01 DIAGNOSIS — Z515 Encounter for palliative care: Secondary | ICD-10-CM

## 2020-03-01 NOTE — Progress Notes (Signed)
June 21st, 2021 [place] Consult Note Telephone: [phone]  Fax: [phone]   PATIENT NAME: Cheryl Vargas) P [place] DOB: July 22, 1935 MRN: [id number] [email or web address]    PRIMARY CARE PROVIDER: Lavone Orn, MD [address] [place],  [place] [address]   Dr. [name] (cardiologist)    REFERRING PROVIDER: Gaston Islam, NP/Dr. Seward Carol   RESPONSIBLE PARTY:   Dtr Courtney Paris [phone] [name] [phone] *Son/HCPOA [name] ([phone] [facility])  ASSESSMENT / RECOMMENDATIONS:  1. Advance Care Planning: A. Directives: Reviewed and discussed with patient. She desires DNR in the event of a cardiopulmonary resuscitation. We also reviewed and completed the MOST (Medical Orders for Scope of Therapy) form. Details: DNR/DNI. Limited Scope of Medical Interventions but would consider ICU admission (but no intubation or mechanical intubation). Yes to Antibiotics and IVFs. Tube feedings depending on long term prognosis of the underlying condition.  B. Goals of Care:  Her driving restrictions (6 month d/t firing of AICD Jan of this year) will be up in July and she is really looking forward to driving again. Wants to continue increasing strength and endurance.    2. Cognitive / Functional Assessment; Symptom Management: Patient is A & O x 3. She has a good handle on her medical condition, and her medications.   She had been feeling down in the dumps; no joy in life. Feeling a bit better today. Also ongoing problems with insomnia; long term use of Ambien which she believes she had built up a resistance to. Her PCP changed her to a slightly higher/extended release dose.  Her appetite was decreased but improved over the last few days. She's been struggling with nausea, Unable to take Phenergan or Zofran 2/2 potential QTC prolongation. Was prescribed Ambien 0.5mg  which helps but causes somnolence. She's going to try  just a half dose prn. Her digoxin is on hold for now as recent blood level was elevated. She was told this may have been contributing to her nausea, as well.   Lives independently (occasional use of walker) and is independent in all ADLs. She's able to transfer, and ambulates with steady gait. Pt following in home; first visit with PT assistant 3 days earlier. They left exercises for her to do but she's been too fatigued / unmotivated to start.   She has had almost a complete recovery from her shingles; only some mild soreness when wearing tighter clothing then her pajamas. She only needs to take prn Tylenol for some arthritic type hip pain  She is continent of bowel and bladder. H/O asymptomatic UTIs.             -consider addition of Cranberry extract 500mg  bid   She's been constipated for about 4 days; finally had a large BM this early am after taking a few Senokot and MiraLax.  -suggested taking Senokot daily, 1-2 tabs a day titrate to response.    CHF (combined systolic/diastolic; EF < 48% recent echo): Appears currently compensated by exam today. Has AICD in place; last discharge Jan of this yr. Her cardiac meds have been adjusted downwards d/t past hypotension. Currently on decreased Zebeta (2.5mg /d); digoxin and lasix currently on hold.    3. Family Supports:  Widowed 5 years ago (spouse was under hospice care; lung cancer with brain mets). Patient is a retired Haematologist. She has 3 very supportive children. [name] and Tim live in [place]. Patient lives in top floor (14th) [place] ([place] Gets  her groceries via [place]. Can't drive till mid July (d/t AICD discharge Jan 2021). Patient's son [name] is her HCPOA. He is a Paramedic in [place]. She has 2 kitties Mallie Mussel and Patty. Now has housekeeping services.   4 Follow up Palliative Care Visit: Patient will call me on a prn basis. I gave her my contact information.   I spent 60 minutes providing this consultation from 1pm  to 2pm. More than 50% of the time in this consultation was spent coordinating communication.    HISTORY OF PRESENT ILLNESS:  Cheryl Vargas is a 85 y.o. year old female with h/o CHF (combined systolic/disatolic; EF < 02% echo Jan 2021), afib, AICD (after cardiac arrest 2018. Last discharge Jan 2021), stage 3a CKD, arthritis, shingles May 2021, UTI.               -5/23-5/28/2021: hospitalized and treated for SIRS (2/2) UTI, CHF, profound fatigue 2/2 medications/hypotension, constipation, atrial fibrillation. Short post hospital rehab stay [facility].   This is a f/u Palliative Care visit from 02/10/2020 when patient was in post hospitalization rehab at [facility].    CODE STATUS: DNR   PPS: weak 60%   HOSPICE ELIGIBILITY/DIAGNOSIS: TBD  PAST MEDICAL HISTORY:  Past Medical History:  Diagnosis Date  . Arthritis   . Chronic combined systolic (congestive) and diastolic (congestive) heart failure ([place])   . Mitral regurgitation   . Non-ischemic cardiomyopathy ([place])   . Persistent atrial fibrillation ([place])   . Ventricular tachycardia ([place])     SOCIAL HX:  Social History   Tobacco Use  . Smoking status: Never Smoker  . Smokeless tobacco: Never Used  Substance Use Topics  . Alcohol use: Yes    Alcohol/week: 3.0 standard drinks    Types: 3 Glasses of wine per week    Comment: per week    ALLERGIES:  Allergies  Allergen Reactions  . Sotalol Other (See Comments)    Prolonged QTc  . Alendronate Sodium Other (See Comments)    Arm pain  . Ciprofloxacin Other (See Comments)    Not effective  . Compazine [Prochlorperazine]   . Coreg [Carvedilol] Other (See Comments)    Fatigue   . Metoprolol Other (See Comments)    Profound fatigue     PERTINENT MEDICATIONS:  Outpatient Encounter Medications as of 03/01/2020  Medication Sig  . acetaminophen (TYLENOL) 500 MG tablet Take 500 mg by mouth every 6 (six) hours as needed for headache.  . bisoprolol (ZEBETA) 5 MG tablet Take 0.5 mg by  mouth daily.  . digoxin (LANOXIN) 0.125 MG tablet Take 0.5 tablets (0.0625 mg total) by mouth daily.  . famotidine (PEPCID) 20 MG tablet Take 1 tablet (20 mg total) by mouth daily. (Patient taking differently: Take 20 mg by mouth daily as needed for heartburn. )  . fluticasone (FLONASE) 50 MCG/ACT nasal spray Place 1 spray into both nostrils daily as needed for allergies or rhinitis.   . hydrocortisone cream 1 % Apply topically 3 (three) times daily as needed for itching.  Marland Kitchen LORazepam (ATIVAN) 0.5 MG tablet Take 1 tablet (0.5 mg total) by mouth daily as needed (nausea). (Patient taking differently: Take 0.5 mg by mouth every 8 (eight) hours as needed (nausea). Patient plans to take just 1/2 tab prn nausea as whole dose causes excessive somnolence.)  . polyethylene glycol (MIRALAX / GLYCOLAX) 17 g packet Take 17 g by mouth 2 (two) times daily.  . Rivaroxaban (XARELTO) 15 MG TABS tablet Take 1 tablet (15  mg total) by mouth daily with supper.  . senna-docusate (SENOKOT-S) 8.6-50 MG tablet Take 2 tablets by mouth 2 (two) times daily.  Marland Kitchen VITAMIN D PO Take 1 tablet by mouth daily.   Marland Kitchen zolpidem (AMBIEN) 5 MG tablet Take 1 tablet (5 mg total) by mouth at bedtime.  . furosemide (LASIX) 20 MG tablet Take 1 tablet (20 mg total) by mouth every other day. (Patient not taking: Reported on 03/01/2020)  . traMADol (ULTRAM) 50 MG tablet Take 1 tablet (50 mg total) by mouth every 6 (six) hours as needed for moderate pain. (Patient not taking: Reported on 03/01/2020)   No facility-administered encounter medications on file as of 03/01/2020.    PHYSICAL EXAM:   BP 106/60, HR 80, Sat 98% Pleasantly conversant, Alert. Able to self-transfer with good agility in/out of bed to bathroom without assistive devices and with steady gait.    General: NAD, slender Cardiovascular: regular rate and rhythm without MRG Pulmonary: clear lung fields Abdomen: soft, nontender, + bowel sounds Extremities: no edema, no joint  deformities Neurological: Generalized weakness but otherwise non-focal   Julianne Handler, NP  Julianne Handler, NP

## 2020-03-02 DIAGNOSIS — G4700 Insomnia, unspecified: Secondary | ICD-10-CM | POA: Diagnosis not present

## 2020-03-02 DIAGNOSIS — M8580 Other specified disorders of bone density and structure, unspecified site: Secondary | ICD-10-CM | POA: Diagnosis not present

## 2020-03-02 DIAGNOSIS — Z87440 Personal history of urinary (tract) infections: Secondary | ICD-10-CM | POA: Diagnosis not present

## 2020-03-02 DIAGNOSIS — I442 Atrioventricular block, complete: Secondary | ICD-10-CM | POA: Diagnosis not present

## 2020-03-02 DIAGNOSIS — I4821 Permanent atrial fibrillation: Secondary | ICD-10-CM | POA: Diagnosis not present

## 2020-03-02 DIAGNOSIS — Z9181 History of falling: Secondary | ICD-10-CM | POA: Diagnosis not present

## 2020-03-02 DIAGNOSIS — I428 Other cardiomyopathies: Secondary | ICD-10-CM | POA: Diagnosis not present

## 2020-03-02 DIAGNOSIS — E7800 Pure hypercholesterolemia, unspecified: Secondary | ICD-10-CM | POA: Diagnosis not present

## 2020-03-02 DIAGNOSIS — I5042 Chronic combined systolic (congestive) and diastolic (congestive) heart failure: Secondary | ICD-10-CM | POA: Diagnosis not present

## 2020-03-02 DIAGNOSIS — M19079 Primary osteoarthritis, unspecified ankle and foot: Secondary | ICD-10-CM | POA: Diagnosis not present

## 2020-03-02 DIAGNOSIS — Z96642 Presence of left artificial hip joint: Secondary | ICD-10-CM | POA: Diagnosis not present

## 2020-03-02 DIAGNOSIS — I110 Hypertensive heart disease with heart failure: Secondary | ICD-10-CM | POA: Diagnosis not present

## 2020-03-02 DIAGNOSIS — I050 Rheumatic mitral stenosis: Secondary | ICD-10-CM | POA: Diagnosis not present

## 2020-03-02 DIAGNOSIS — Z7901 Long term (current) use of anticoagulants: Secondary | ICD-10-CM | POA: Diagnosis not present

## 2020-03-03 ENCOUNTER — Telehealth (HOSPITAL_COMMUNITY): Payer: Self-pay | Admitting: *Deleted

## 2020-03-03 DIAGNOSIS — Z9181 History of falling: Secondary | ICD-10-CM | POA: Diagnosis not present

## 2020-03-03 DIAGNOSIS — I110 Hypertensive heart disease with heart failure: Secondary | ICD-10-CM | POA: Diagnosis not present

## 2020-03-03 DIAGNOSIS — Z87440 Personal history of urinary (tract) infections: Secondary | ICD-10-CM | POA: Diagnosis not present

## 2020-03-03 DIAGNOSIS — M8580 Other specified disorders of bone density and structure, unspecified site: Secondary | ICD-10-CM | POA: Diagnosis not present

## 2020-03-03 DIAGNOSIS — Z96642 Presence of left artificial hip joint: Secondary | ICD-10-CM | POA: Diagnosis not present

## 2020-03-03 DIAGNOSIS — E7800 Pure hypercholesterolemia, unspecified: Secondary | ICD-10-CM | POA: Diagnosis not present

## 2020-03-03 DIAGNOSIS — M19079 Primary osteoarthritis, unspecified ankle and foot: Secondary | ICD-10-CM | POA: Diagnosis not present

## 2020-03-03 DIAGNOSIS — I4821 Permanent atrial fibrillation: Secondary | ICD-10-CM | POA: Diagnosis not present

## 2020-03-03 DIAGNOSIS — G4700 Insomnia, unspecified: Secondary | ICD-10-CM | POA: Diagnosis not present

## 2020-03-03 DIAGNOSIS — I428 Other cardiomyopathies: Secondary | ICD-10-CM | POA: Diagnosis not present

## 2020-03-03 DIAGNOSIS — I442 Atrioventricular block, complete: Secondary | ICD-10-CM | POA: Diagnosis not present

## 2020-03-03 DIAGNOSIS — Z7901 Long term (current) use of anticoagulants: Secondary | ICD-10-CM | POA: Diagnosis not present

## 2020-03-03 DIAGNOSIS — I050 Rheumatic mitral stenosis: Secondary | ICD-10-CM | POA: Diagnosis not present

## 2020-03-03 DIAGNOSIS — I5042 Chronic combined systolic (congestive) and diastolic (congestive) heart failure: Secondary | ICD-10-CM | POA: Diagnosis not present

## 2020-03-03 NOTE — Telephone Encounter (Signed)
Dr.John Laurann Montana called requesting call from [place] about patient.  Call back # [phone]   Forwarded to Dr.McLean

## 2020-03-05 ENCOUNTER — Encounter (HOSPITAL_COMMUNITY): Payer: PPO | Admitting: Cardiology

## 2020-03-09 DIAGNOSIS — Z7901 Long term (current) use of anticoagulants: Secondary | ICD-10-CM | POA: Diagnosis not present

## 2020-03-09 DIAGNOSIS — Z96642 Presence of left artificial hip joint: Secondary | ICD-10-CM | POA: Diagnosis not present

## 2020-03-09 DIAGNOSIS — I4821 Permanent atrial fibrillation: Secondary | ICD-10-CM | POA: Diagnosis not present

## 2020-03-09 DIAGNOSIS — I110 Hypertensive heart disease with heart failure: Secondary | ICD-10-CM | POA: Diagnosis not present

## 2020-03-09 DIAGNOSIS — I5042 Chronic combined systolic (congestive) and diastolic (congestive) heart failure: Secondary | ICD-10-CM | POA: Diagnosis not present

## 2020-03-09 DIAGNOSIS — M19079 Primary osteoarthritis, unspecified ankle and foot: Secondary | ICD-10-CM | POA: Diagnosis not present

## 2020-03-09 DIAGNOSIS — I442 Atrioventricular block, complete: Secondary | ICD-10-CM | POA: Diagnosis not present

## 2020-03-09 DIAGNOSIS — G4700 Insomnia, unspecified: Secondary | ICD-10-CM | POA: Diagnosis not present

## 2020-03-09 DIAGNOSIS — E7800 Pure hypercholesterolemia, unspecified: Secondary | ICD-10-CM | POA: Diagnosis not present

## 2020-03-09 DIAGNOSIS — Z87440 Personal history of urinary (tract) infections: Secondary | ICD-10-CM | POA: Diagnosis not present

## 2020-03-09 DIAGNOSIS — M8580 Other specified disorders of bone density and structure, unspecified site: Secondary | ICD-10-CM | POA: Diagnosis not present

## 2020-03-09 DIAGNOSIS — I428 Other cardiomyopathies: Secondary | ICD-10-CM | POA: Diagnosis not present

## 2020-03-09 DIAGNOSIS — Z9181 History of falling: Secondary | ICD-10-CM | POA: Diagnosis not present

## 2020-03-09 DIAGNOSIS — I050 Rheumatic mitral stenosis: Secondary | ICD-10-CM | POA: Diagnosis not present

## 2020-03-10 ENCOUNTER — Ambulatory Visit (INDEPENDENT_AMBULATORY_CARE_PROVIDER_SITE_OTHER): Payer: PPO | Admitting: *Deleted

## 2020-03-10 DIAGNOSIS — I472 Ventricular tachycardia, unspecified: Secondary | ICD-10-CM

## 2020-03-10 DIAGNOSIS — I5023 Acute on chronic systolic (congestive) heart failure: Secondary | ICD-10-CM

## 2020-03-10 LAB — CUP PACEART REMOTE DEVICE CHECK
Battery Remaining Longevity: 53 mo
Battery Voltage: 2.96 V
Brady Statistic AP VP Percent: 0 %
Brady Statistic AP VS Percent: 0 %
Brady Statistic AS VP Percent: 0 %
Brady Statistic AS VS Percent: 0 %
Brady Statistic RA Percent Paced: 0 %
Brady Statistic RV Percent Paced: 97.68 %
Date Time Interrogation Session: 20210630033323
HighPow Impedance: 51 Ohm
Implantable Lead Implant Date: 20181228
Implantable Lead Implant Date: 20181228
Implantable Lead Implant Date: 20181228
Implantable Lead Location: 753858
Implantable Lead Location: 753859
Implantable Lead Location: 753860
Implantable Lead Model: 4598
Implantable Lead Model: 5076
Implantable Pulse Generator Implant Date: 20181228
Lead Channel Impedance Value: 151.406
Lead Channel Impedance Value: 151.406
Lead Channel Impedance Value: 161.5 Ohm
Lead Channel Impedance Value: 161.5 Ohm
Lead Channel Impedance Value: 161.5 Ohm
Lead Channel Impedance Value: 285 Ohm
Lead Channel Impedance Value: 323 Ohm
Lead Channel Impedance Value: 323 Ohm
Lead Channel Impedance Value: 323 Ohm
Lead Channel Impedance Value: 342 Ohm
Lead Channel Impedance Value: 380 Ohm
Lead Channel Impedance Value: 4047 Ohm
Lead Channel Impedance Value: 437 Ohm
Lead Channel Impedance Value: 513 Ohm
Lead Channel Impedance Value: 551 Ohm
Lead Channel Impedance Value: 551 Ohm
Lead Channel Impedance Value: 551 Ohm
Lead Channel Impedance Value: 551 Ohm
Lead Channel Pacing Threshold Amplitude: 0.5 V
Lead Channel Pacing Threshold Amplitude: 0.5 V
Lead Channel Pacing Threshold Pulse Width: 0.4 ms
Lead Channel Pacing Threshold Pulse Width: 0.4 ms
Lead Channel Sensing Intrinsic Amplitude: 8.625 mV
Lead Channel Sensing Intrinsic Amplitude: 8.625 mV
Lead Channel Setting Pacing Amplitude: 1 V
Lead Channel Setting Pacing Amplitude: 2.5 V
Lead Channel Setting Pacing Pulse Width: 0.4 ms
Lead Channel Setting Pacing Pulse Width: 0.4 ms
Lead Channel Setting Sensing Sensitivity: 0.3 mV

## 2020-03-11 DIAGNOSIS — G4701 Insomnia due to medical condition: Secondary | ICD-10-CM | POA: Diagnosis not present

## 2020-03-11 DIAGNOSIS — R110 Nausea: Secondary | ICD-10-CM | POA: Diagnosis not present

## 2020-03-11 NOTE — Progress Notes (Signed)
Remote ICD transmission.   

## 2020-03-16 DIAGNOSIS — I4821 Permanent atrial fibrillation: Secondary | ICD-10-CM | POA: Diagnosis not present

## 2020-03-16 DIAGNOSIS — Z7901 Long term (current) use of anticoagulants: Secondary | ICD-10-CM | POA: Diagnosis not present

## 2020-03-16 DIAGNOSIS — Z96642 Presence of left artificial hip joint: Secondary | ICD-10-CM | POA: Diagnosis not present

## 2020-03-16 DIAGNOSIS — I442 Atrioventricular block, complete: Secondary | ICD-10-CM | POA: Diagnosis not present

## 2020-03-16 DIAGNOSIS — I110 Hypertensive heart disease with heart failure: Secondary | ICD-10-CM | POA: Diagnosis not present

## 2020-03-16 DIAGNOSIS — M19079 Primary osteoarthritis, unspecified ankle and foot: Secondary | ICD-10-CM | POA: Diagnosis not present

## 2020-03-16 DIAGNOSIS — M8580 Other specified disorders of bone density and structure, unspecified site: Secondary | ICD-10-CM | POA: Diagnosis not present

## 2020-03-16 DIAGNOSIS — Z87440 Personal history of urinary (tract) infections: Secondary | ICD-10-CM | POA: Diagnosis not present

## 2020-03-16 DIAGNOSIS — Z9181 History of falling: Secondary | ICD-10-CM | POA: Diagnosis not present

## 2020-03-16 DIAGNOSIS — I050 Rheumatic mitral stenosis: Secondary | ICD-10-CM | POA: Diagnosis not present

## 2020-03-16 DIAGNOSIS — I428 Other cardiomyopathies: Secondary | ICD-10-CM | POA: Diagnosis not present

## 2020-03-16 DIAGNOSIS — I5042 Chronic combined systolic (congestive) and diastolic (congestive) heart failure: Secondary | ICD-10-CM | POA: Diagnosis not present

## 2020-03-16 DIAGNOSIS — E7800 Pure hypercholesterolemia, unspecified: Secondary | ICD-10-CM | POA: Diagnosis not present

## 2020-03-16 DIAGNOSIS — G4700 Insomnia, unspecified: Secondary | ICD-10-CM | POA: Diagnosis not present

## 2020-03-22 ENCOUNTER — Ambulatory Visit (INDEPENDENT_AMBULATORY_CARE_PROVIDER_SITE_OTHER): Payer: PPO

## 2020-03-22 DIAGNOSIS — I5022 Chronic systolic (congestive) heart failure: Secondary | ICD-10-CM | POA: Diagnosis not present

## 2020-03-22 DIAGNOSIS — Z95810 Presence of automatic (implantable) cardiac defibrillator: Secondary | ICD-10-CM

## 2020-03-22 NOTE — Progress Notes (Signed)
EPIC Encounter for ICM Monitoring  Patient Name: TRENT THEISEN is a 85 y.o. female Date: 03/22/2020 Primary Care Physican: Lavone Orn, MD Primary Cardiologist:McLean Electrophysiologist:Allred Bi-V Pacing:96.8% 7/12/2021Weight:132lbs    Spoke with patient. She denies fluid symptoms.  She has been eating chips and salty snacks the last few weeks and takes Lasix PRN.  OptivolThoracic impedancesuggesting possible ongoing fluid accumulation since 03/04/2020 and fluid index > threshold on 03/18/2020.  Impedance is trending toward baseline.  Prescribed: Furosemide20 mg take1tablet by mouthevery other day.  Per patient and 02/06/20 Hospital discharge instructions she was told to take Lasix PRN which she has only taken about 2 in the last month.  Labs: 02/06/2020 Creatinine 0.89, BUN 15, Potassium 4.7, Sodium 136, GFR >60 02/04/2020 Creatinine 0.87, BUN 12, Potassium 4.0, Sodium 135, GFR >60  02/03/2020 Creatinine 0.93, BUN 16, Potassium 4.0, Sodium 135, GFR 57->60  02/02/2020 Creatinine 1.07, BUN 17, Potassium 4.3, Sodium 138, GFR 48-56  02/01/2020 Creatinine 1.15, BUN 19, Potassium 4.6, Sodium 136, GFR 44-51  01/15/2020 Creatinine 1.04, BUN 16, Potassium 4.1, Sodium 136, GFR 50-58  10/09/2019 Creatinine1.36, BUN35, Potassium3.6, Sodium136, [id number] A complete set of results can be found in Results Review  Recommendations:  Advised to take Furosemide 20 mg 1 tablet x 2 days and return to PRN as instructed at 02/06/20 hospital discharge  Follow-up plan: [facility] clinic phone appointment on 03/31/2020 to recheck fluid levels.   91 day device clinic remote transmission 06/09/2020.    EP/Cardiology Office Visits: 05/13/2020 with Oda Kilts, [place].    Copy of ICM check sent to Dr. Rayann Heman and Dr Aundra Dubin.   3 month ICM trend: 03/22/2020    1 Year ICM trend:       Rosalene Billings, RN 03/22/2020 12:54 PM

## 2020-03-24 ENCOUNTER — Telehealth: Payer: Self-pay

## 2020-03-24 NOTE — Telephone Encounter (Signed)
Returned patient call and she reported Afib episode at 6 PM last night with heart rate of 110.  She said she took 1/2 tablet of Lorazepam and HR went back to normal and thinks she was back in NSR.  She continues taking Xarelto.  She feels fine today but was wondering if the device sent an alert for Afib.  Advised no alerts were sent overnight or today.  She was appreciative of the call back.

## 2020-03-31 ENCOUNTER — Ambulatory Visit (INDEPENDENT_AMBULATORY_CARE_PROVIDER_SITE_OTHER): Payer: PPO

## 2020-03-31 DIAGNOSIS — Z95810 Presence of automatic (implantable) cardiac defibrillator: Secondary | ICD-10-CM

## 2020-03-31 DIAGNOSIS — I5022 Chronic systolic (congestive) heart failure: Secondary | ICD-10-CM

## 2020-03-31 NOTE — Progress Notes (Signed)
EPIC Encounter for ICM Monitoring  Patient Name: Cheryl Vargas is a 85 y.o. female Date: 03/31/2020 Primary Care Physican: Lavone Orn, MD Primary Cardiologist:McLean Electrophysiologist:Allred Bi-V Pacing:91.6% 7/21/2021Weight:126.7lbs    Spoke with patient.  She reports SOB comes and goes that makes her feel like she is going to pass out.  She says her HR increases to as high as 137 but does not last long and then  drops to 62.  She is taking Lasix PRN and last dosage was 03/30/2020.  Diet:  Not following low salt diet  OptivolThoracic impedancesuggesting possible ongoing fluid accumulation since 03/04/2020 and fluid index > threshold on 03/18/2020.    Prescribed:  Furosemide20 mg take1tablet by mouthevery other day.  Per patient she was instructed at time of 02/06/20 hospital discharge to take PRN. She said she was instructed to take Digoxin every other dayat time of 02/06/20 hospital discharge.   Labs: 02/06/2020 Creatinine 0.89, BUN 15, Potassium 4.7, Sodium 136, GFR >60 02/04/2020 Creatinine 0.87, BUN 12, Potassium 4.0, Sodium 135, GFR >60  02/03/2020 Creatinine 0.93, BUN 16, Potassium 4.0, Sodium 135, GFR 57->60  02/02/2020 Creatinine 1.07, BUN 17, Potassium 4.3, Sodium 138, GFR 48-56  02/01/2020 Creatinine 1.15, BUN 19, Potassium 4.6, Sodium 136, GFR 44-51  01/15/2020 Creatinine 1.04, BUN 16, Potassium 4.1, Sodium 136, GFR 50-58  10/09/2019 Creatinine1.36, BUN35, Potassium3.6, Sodium136, [id number] A complete set of results can be found in Results Review  Recommendations:  Advised to take Furosemide 20 mg 1 tablet x 3 consecutive days  Follow-up plan: ICM clinic phone appointment on 04/02/2020 to recheck fluid levels.   91 day device clinic remote transmission 06/09/2020.    EP/Cardiology Office Visits: 05/13/2020 with Oda Kilts, [place].   No scheduled Advanced HF clinic appointments at this time.   Copy of ICM check sent to Dr. Rayann Heman and Dr Aundra Dubin for review.   3 month ICM trend: 03/31/2020    1 Year ICM trend:       Rosalene Billings, RN 03/31/2020 8:25 AM

## 2020-04-02 ENCOUNTER — Ambulatory Visit (INDEPENDENT_AMBULATORY_CARE_PROVIDER_SITE_OTHER): Payer: PPO

## 2020-04-02 DIAGNOSIS — I5022 Chronic systolic (congestive) heart failure: Secondary | ICD-10-CM

## 2020-04-02 DIAGNOSIS — Z95810 Presence of automatic (implantable) cardiac defibrillator: Secondary | ICD-10-CM

## 2020-04-02 MED ORDER — FUROSEMIDE 20 MG PO TABS: 20.0000 mg | ORAL_TABLET | ORAL | 3 refills | Status: DC

## 2020-04-02 NOTE — Progress Notes (Signed)
Spoke with patient.  Advised Dr Aundra Dubin recommended she take Lasix 20 mg daily x 2 days then every other day after that. She verbalized understanding and has lasix on hand.  No refill needed at this time.  She agreed to have BMET drawn on 04/08/2020 at 12:30 PM.

## 2020-04-02 NOTE — Addendum Note (Signed)
Addended by: Rosalene Billings on: 04/02/2020 04:19 PM   Modules accepted: Orders

## 2020-04-02 NOTE — Progress Notes (Signed)
With steady fall in impedance, would have her take Lasix 20 mg daily x 2 days then every other day after that.  BMET 1 week.

## 2020-04-02 NOTE — Progress Notes (Signed)
EPIC Encounter for ICM Monitoring  Patient Name: Cheryl Vargas is a 85 y.o. female Date: 04/02/2020 Primary Care Physican: Lavone Orn, MD Primary Cardiologist:McLean Electrophysiologist:Allred Bi-V Pacing:95.2% 7/21/2021Weight:126.7lbs    Spoke with patient. She reports she is feeling better since taking PRN Lasix for past 3 days.    Diet:  Not following low salt diet  OptivolThoracic impedancesuggesting possible ongoing fluid accumulation since 03/04/2020 and fluid index >threshold on 03/18/2020.   Prescribed:  Furosemide20 mg take1tablet by mouthevery other day.Per patient she was instructed at time of 02/06/20 hospital discharge to take PRN. She said she was instructed to take Digoxin every other dayat time of 02/06/20 hospital discharge.   Labs: 02/06/2020 Creatinine0.89, BUN15, Potassium4.7, Sodium136, GFR>60 02/04/2020 Creatinine0.87, BUN12, Potassium4.0, Sodium135, GFR>60  02/03/2020 Creatinine0.93, BUN16, Potassium4.0, Sodium135, GFR57->60  02/02/2020 Creatinine1.07, BUN17, Potassium4.3, [id number], [id number]  02/01/2020 Creatinine1.15, BUN19, Potassium4.6, Sodium136, [id number]  01/15/2020 Creatinine1.04, BUN16, Potassium4.1, Sodium136, GFR50-58 10/09/2019 Creatinine1.36, BUN35, Potassium3.6, [id number], [id number] A complete set of results can be found in Results Review  Recommendations:Advised to take Furosemide 20 mg 1 tablet x 2 more consecutive days  Follow-up plan: ICM clinic phone appointment on7/26/2021 to recheck fluid levels. 91 day device clinic remote transmission 06/09/2020.   EP/Cardiology Office Visits:05/13/2020 [facility].  No Advanced HF clinic appointments scheduled at this time.  Copy of ICM check sent to Dr.Allred and Dr Aundra Dubin for [facility].   3 month ICM trend: 04/02/2020    1 Year ICM trend:       Rosalene Billings, RN 04/02/2020 1:57 PM

## 2020-04-07 ENCOUNTER — Ambulatory Visit (INDEPENDENT_AMBULATORY_CARE_PROVIDER_SITE_OTHER): Payer: PPO

## 2020-04-07 DIAGNOSIS — I5022 Chronic systolic (congestive) heart failure: Secondary | ICD-10-CM

## 2020-04-07 DIAGNOSIS — Z95810 Presence of automatic (implantable) cardiac defibrillator: Secondary | ICD-10-CM

## 2020-04-08 ENCOUNTER — Ambulatory Visit (HOSPITAL_COMMUNITY)
Admission: RE | Admit: 2020-04-08 | Discharge: 2020-04-08 | Disposition: A | Discharge status: Home / Self Care | Payer: PPO | Source: Ambulatory Visit | Attending: Cardiology | Admitting: Cardiology

## 2020-04-08 ENCOUNTER — Other Ambulatory Visit: Payer: Self-pay

## 2020-04-08 DIAGNOSIS — I5022 Chronic systolic (congestive) heart failure: Secondary | ICD-10-CM | POA: Diagnosis not present

## 2020-04-08 LAB — BASIC METABOLIC PANEL
Anion gap: 10 (ref 5–15)
BUN: 22 mg/dL (ref 8–23)
CO2: 25 mmol/L (ref 22–32)
Calcium: 8.8 mg/dL — ABNORMAL LOW (ref 8.9–10.3)
Chloride: 103 mmol/L (ref 98–111)
Creatinine, Ser: 1.13 mg/dL — ABNORMAL HIGH (ref 0.44–1.00)
GFR calc Af Amer: 52 mL/min — ABNORMAL LOW (ref >60)
GFR calc non Af Amer: 45 mL/min — ABNORMAL LOW (ref >60)
Glucose, Bld: 132 mg/dL — ABNORMAL HIGH (ref 70–99)
Potassium: 3.6 mmol/L (ref 3.5–5.1)
Sodium: 138 mmol/L (ref 135–145)

## 2020-04-09 MED ORDER — FUROSEMIDE 20 MG PO TABS: 20.0000 mg | ORAL_TABLET | Freq: Every day | ORAL | 3 refills | Status: DC

## 2020-04-09 NOTE — Progress Notes (Signed)
Spoke with patient.  She said she had another episode about an hour ago that she was SOB and weak but took Ativan and feeling better.  Advised Dr Aundra Dubin ordered for her to take Furosemide 40 mg daily x 3 days and then 20 mg every day.  She agreed to lab appointment on 04/19/2020 at 12:00 PM.

## 2020-04-09 NOTE — Progress Notes (Signed)
EPIC Encounter for ICM Monitoring  Patient Name: Cheryl Vargas is a 85 y.o. female Date: 04/09/2020 Primary Care Physican: Lavone Orn, MD Primary Cardiologist:McLean Electrophysiologist:Allred Bi-V Pacing:95.2% 7/21/2021Weight:126.7lbs 04/09/2020 Weight: 126-127 lbs    Spoke with patient.  She took Furosemide X 2 days and then every other day as instructed on 7/23.  She reports SOB when walking and weakness.  She is unsure if her heart is causing the symptoms or if she is having panic attacks.  Diet: Not following low salt diet  OptivolThoracic impedancesuggesting possible ongoing fluid accumulation since 03/04/2020.  No change in impedance after Furosemide dosage change on 04/02/2020   Prescribed:  Furosemide20 mg take1tablet by mouthevery other day.  Labs: 04/08/2020 Creatinine 1.13, BUN 22, Potassium 3.6, Sodium 138, GFR 45-52 02/06/2020 Creatinine0.89, BUN15, Potassium4.7, Sodium136, GFR>60 02/04/2020 Creatinine0.87, BUN12, Potassium4.0, Sodium135, GFR>60  02/03/2020 Creatinine0.93, BUN16, Potassium4.0, Sodium135, GFR57->60  A complete set of results can be found in Results Review  Recommendations:Advised to limit salt intake and will call back if any recommendations from Dr Aundra Dubin.  Encouraged to make a follow up appointment with Dr Aundra Dubin since she has not been feeling well since hospital discharge.  Follow-up plan: ICM clinic phone appointment on8/12/2019 to recheck fluid levels. 91 day device clinic remote transmission 06/09/2020.   EP/Cardiology Office Visits:05/13/2020 [facility].No Advanced HF clinic appointments scheduled at this time and no recall  [facility] of ICM check sent to [facility] andDr Aundra Dubin for review and recommendations if needed.  3 month ICM trend: 04/08/2020    1 Year ICM trend:       Rosalene Billings, RN 04/09/2020 10:40 AM

## 2020-04-09 NOTE — Progress Notes (Signed)
Take Lasix 40 mg daily x 3 days then 20 mg daily after that (not every other day).  BMET 1 week.

## 2020-04-14 ENCOUNTER — Ambulatory Visit (INDEPENDENT_AMBULATORY_CARE_PROVIDER_SITE_OTHER): Payer: PPO

## 2020-04-14 DIAGNOSIS — I5022 Chronic systolic (congestive) heart failure: Secondary | ICD-10-CM

## 2020-04-14 DIAGNOSIS — Z95810 Presence of automatic (implantable) cardiac defibrillator: Secondary | ICD-10-CM

## 2020-04-14 NOTE — Progress Notes (Signed)
EPIC Encounter for ICM Monitoring  Patient Name: Cheryl Vargas is a 85 y.o. female Date: 04/14/2020 Primary Care Physican: Lavone Orn, MD Primary Cardiologist:McLean Electrophysiologist:Allred Bi-V Pacing:90.7% 7/21/2021Weight:126.7lbs 04/09/2020 Weight: 126-127 lbs    Spoke with patient and reports she is feeling fine today.     OptivolThoracic impedance returned to normal after taking Furosemide 40 mg x 3 days followed by 20 mg daily.  Prescribed:  Furosemide20 mg take1tablet by mouthdaily.  Labs: BMET scheduled for 04/19/2020 04/08/2020 Creatinine 1.13, BUN 22, Potassium 3.6, Sodium 138, GFR 45-52 02/06/2020 Creatinine0.89, BUN15, Potassium4.7, Sodium136, GFR>60 02/04/2020 Creatinine0.87, BUN12, Potassium4.0, Sodium135, GFR>60  02/03/2020 Creatinine0.93, BUN16, Potassium4.0, Sodium135, GFR57->60  A complete set of results can be found in Results Review  Recommendations:No changes and encouraged to call if experiencing any fluid symptoms.  Follow-up plan: ICM clinic phone appointment on8/30/2021. 91 day device clinic remote transmission 06/09/2020.   EP/Cardiology Office Visits:05/13/2020 [facility].NoAdvanced HF clinic appointmentsscheduledat this time and no recall  Copy of ICM check sent to Dr.Allred.  3 month ICM trend: 04/14/2020    1 Year ICM trend:       Cheryl Billings, RN 04/14/2020 12:54 PM

## 2020-04-19 ENCOUNTER — Ambulatory Visit (HOSPITAL_COMMUNITY)
Admission: RE | Admit: 2020-04-19 | Discharge: 2020-04-19 | Disposition: A | Discharge status: Home / Self Care | Payer: PPO | Source: Ambulatory Visit | Attending: Cardiology | Admitting: Cardiology

## 2020-04-19 ENCOUNTER — Other Ambulatory Visit: Payer: Self-pay

## 2020-04-19 ENCOUNTER — Other Ambulatory Visit (HOSPITAL_COMMUNITY): Payer: Self-pay | Admitting: *Deleted

## 2020-04-19 DIAGNOSIS — I5022 Chronic systolic (congestive) heart failure: Secondary | ICD-10-CM

## 2020-04-19 LAB — BASIC METABOLIC PANEL
Anion gap: 12 (ref 5–15)
BUN: 20 mg/dL (ref 8–23)
CO2: 27 mmol/L (ref 22–32)
Calcium: 9 mg/dL (ref 8.9–10.3)
Chloride: 101 mmol/L (ref 98–111)
Creatinine, Ser: 1.16 mg/dL — ABNORMAL HIGH (ref 0.44–1.00)
GFR calc Af Amer: 51 mL/min — ABNORMAL LOW (ref >60)
GFR calc non Af Amer: 44 mL/min — ABNORMAL LOW (ref >60)
Glucose, Bld: 90 mg/dL (ref 70–99)
Potassium: 3.7 mmol/L (ref 3.5–5.1)
Sodium: 140 mmol/L (ref 135–145)

## 2020-04-26 ENCOUNTER — Telehealth: Payer: Self-pay

## 2020-04-26 NOTE — Telephone Encounter (Signed)
Returned call to patient as requested by voice mail message.  She stated she feels like she may be having some fluid symptoms with 1 lb weight gain with chest congestion.  She said she generally is not feeling well.  Attempted to assist to send remote transmission for review but monitor showed error message.  Advised to call [employer] tech support for assistance and she has the number.  Advised to call back when remote transmission is sent.

## 2020-04-26 NOTE — Telephone Encounter (Signed)
Spoke with patient and advised remote transmission was received.  Explained report suggests possible fluid accumulation is worse since last remote transmission sent on 04/14/2020.   She confirms she has been compliant with taking Furosemide 20 mg daily.   Advised to take Furosemide 40 mg daily x 2 days and then return to 20 mg daily.    Will send copy to Dr Aundra Dubin for any further recommendations.  Recheck fluid levels on 04/30/2020.  04/26/2020 Optivol impedance

## 2020-04-30 ENCOUNTER — Ambulatory Visit (INDEPENDENT_AMBULATORY_CARE_PROVIDER_SITE_OTHER): Payer: PPO

## 2020-04-30 DIAGNOSIS — I5022 Chronic systolic (congestive) heart failure: Secondary | ICD-10-CM

## 2020-04-30 DIAGNOSIS — Z95810 Presence of automatic (implantable) cardiac defibrillator: Secondary | ICD-10-CM

## 2020-04-30 NOTE — Telephone Encounter (Signed)
Spoke with patient. She said she is feeling better today and was able to get out to get her groceries without an episode of weakness. Reviewed transmission and she will take prescribed Furosemide dosage of 20 mg daily.   Optivol impedance returned close to baseline normal after taking 40 mg Furosemide x 3 days.    Advised will send updated copy of report to Dr Aundra Dubin.  Next ICM remote transmission scheduled for 05/10/2020   04/30/2020 Updated Optivol report

## 2020-05-03 NOTE — Progress Notes (Signed)
EPIC Encounter for ICM Monitoring  Patient Name: Cheryl Vargas is a 85 y.o. female Date: 05/03/2020 Primary Care Physican: Lavone Orn, MD Primary Cardiologist:McLean Electrophysiologist:Allred Bi-V Pacing:93.2% 7/21/2021Weight:126.7lbs 04/09/2020 Weight: 126-127 lbs   Spoke with patient on 04/30/2020.   She reports feeling better after taking 40 mg Furosemide x 3 days.   OptivolThoracic impedance returned to normal after taking Furosemide 40 mg x 3 days followed by 20 mg daily.  Prescribed:  Furosemide20 mg take1tablet by mouthdaily.  Labs:  04/19/2020 Creatinine 1.16, BUN 20, Potassium 3.7, Sodium 140, GFR 44-51 04/08/2020 Creatinine 1.13, BUN 22, Potassium 3.6, Sodium 138, GFR 45-52 02/06/2020 Creatinine0.89, BUN15, Potassium4.7, Sodium136, GFR>60 02/04/2020 Creatinine0.87, BUN12, Potassium4.0, Sodium135, GFR>60  02/03/2020 Creatinine0.93, BUN16, Potassium4.0, Sodium135, GFR57->60  A complete set of results can be found in Results Review  Recommendations:No changes.  Follow-up plan: ICM clinic phone appointment on8/30/2021. 91 day device clinic remote transmission 06/09/2020.   EP/Cardiology Office Visits:05/13/2020 [facility].NoAdvanced HF clinic appointmentsscheduledat this timeand no recall  Copy of ICM check sent to Dr.Allred.   3 month ICM trend: 04/30/2020    1 Year ICM trend:       Rosalene Billings, RN 05/03/2020 11:39 AM

## 2020-05-10 ENCOUNTER — Telehealth: Payer: Self-pay

## 2020-05-10 ENCOUNTER — Ambulatory Visit (INDEPENDENT_AMBULATORY_CARE_PROVIDER_SITE_OTHER): Payer: PPO

## 2020-05-10 DIAGNOSIS — Z95810 Presence of automatic (implantable) cardiac defibrillator: Secondary | ICD-10-CM

## 2020-05-10 DIAGNOSIS — I5022 Chronic systolic (congestive) heart failure: Secondary | ICD-10-CM

## 2020-05-10 NOTE — Telephone Encounter (Signed)
Remote ICM transmission received.  Attempted call to patient regarding ICM remote transmission and left detailed message per DPR.  Advised to return call for any fluid symptoms or questions.  

## 2020-05-10 NOTE — Progress Notes (Signed)
EPIC Encounter for ICM Monitoring  Patient Name: Cheryl Vargas is a 85 y.o. female Date: 05/10/2020 Primary Care Physican: Lavone Orn, MD Primary Cardiologist:McLean Electrophysiologist:Allred Bi-V Pacing:90.7% 04/09/2020 Weight: 126-127 lbs   Attempted call to patient and unable to reach.  Left message to return call. Transmission reviewed.   OptivolThoracic impedancesuggesting possible fluid accumulation since 04/30/2020.  Prescribed:  Furosemide20 mg take1tablet by mouthdaily.  Labs: 04/19/2020 Creatinine 1.16, BUN 20, Potassium 3.7, Sodium 140, GFR 44-51 04/08/2020 Creatinine 1.13, BUN 22, Potassium 3.6, Sodium 138, GFR 45-52 02/06/2020 Creatinine0.89, BUN15, Potassium4.7, Sodium136, GFR>60 02/04/2020 Creatinine0.87, BUN12, Potassium4.0, Sodium135, GFR>60  02/03/2020 Creatinine0.93, BUN16, Potassium4.0, Sodium135, GFR57->60  A complete set of results can be found in Results Review  Recommendations:Unable to reach.    Follow-up plan: ICM clinic phone appointment on9/03/2020 (manual) to recheck fluid levels. 91 day device clinic remote transmission 06/09/2020.   EP/Cardiology Office Visits:05/25/2020 [facility].NoAdvanced HF clinic appointmentsscheduledat this timeand no recall  Copy of ICM check sent to Dr.Allred and Dr Aundra Dubin for review.   3 month ICM trend: 05/10/2020    1 Year ICM trend:       Rosalene Billings, RN 05/10/2020 11:43 AM

## 2020-05-11 NOTE — Telephone Encounter (Signed)
Attempted call to patient and left message for return call.  

## 2020-05-13 ENCOUNTER — Encounter: Payer: PPO | Admitting: Student

## 2020-05-13 NOTE — Progress Notes (Signed)
Increase Lasix to 40 mg daily with KCl 20 daily.  Needs BMET 1 week.  Needs CHF clinic followup.

## 2020-05-14 MED ORDER — POTASSIUM CHLORIDE CRYS ER 20 MEQ PO TBCR
20.0000 meq | EXTENDED_RELEASE_TABLET | Freq: Every day | ORAL | 3 refills | Status: DC
Start: 2020-05-14 — End: 2020-08-18

## 2020-05-14 MED ORDER — FUROSEMIDE 20 MG PO TABS: 40.0000 mg | ORAL_TABLET | Freq: Every day | ORAL | 3 refills | Status: DC

## 2020-05-14 NOTE — Progress Notes (Signed)
Spoke with patient.  She has stomach bloating and feels like it is fluid.  Advised Dr Aundra Dubin recommended she increase Lasix to 40 mg daily with KCl 20 daily.  BMET scheduled fo 9/9 at 2:15 at HF clinic.  Advised he would also like for her to follow up on HF clinic and will have scheduler give her a call.  She requires prescriptions to be sent to [facility] for Lasix and KCL   Advised if she is unable to tolerate increased dosage to call back.  Manual transmission will be sent 9/7 to recheck fluid levels.

## 2020-05-18 ENCOUNTER — Ambulatory Visit (INDEPENDENT_AMBULATORY_CARE_PROVIDER_SITE_OTHER): Payer: PPO

## 2020-05-18 DIAGNOSIS — I5022 Chronic systolic (congestive) heart failure: Secondary | ICD-10-CM

## 2020-05-18 DIAGNOSIS — Z95810 Presence of automatic (implantable) cardiac defibrillator: Secondary | ICD-10-CM

## 2020-05-19 ENCOUNTER — Other Ambulatory Visit: Payer: PPO | Admitting: Hospice

## 2020-05-19 ENCOUNTER — Other Ambulatory Visit: Payer: Self-pay

## 2020-05-19 ENCOUNTER — Telehealth: Payer: Self-pay

## 2020-05-19 DIAGNOSIS — Z515 Encounter for palliative care: Secondary | ICD-10-CM | POA: Diagnosis not present

## 2020-05-19 DIAGNOSIS — I5040 Unspecified combined systolic (congestive) and diastolic (congestive) heart failure: Secondary | ICD-10-CM

## 2020-05-19 DIAGNOSIS — R55 Syncope and collapse: Secondary | ICD-10-CM

## 2020-05-19 NOTE — Telephone Encounter (Signed)
See ICM note for remote transmission review and call back to patient.

## 2020-05-19 NOTE — Progress Notes (Signed)
EPIC Encounter for ICM Monitoring  Patient Name: Cheryl Vargas is a 85 y.o. female Date: 05/19/2020 Primary Care Physican: Lavone Orn, MD Primary Cardiologist:McLean Electrophysiologist:Allred Bi-V Pacing:95.6% 04/09/2020 Weight: 126-127 lbs   Spoke with patient.   She reports she was not feeling well yesterday.  She felt extremely Cheryl Vargas of breath and weak for about 15 minutes after having a bowel movement.  She stated she felt fine after the 15 minutes but did feel a little weak for part of the day.  She feels fine today.  OptivolThoracic impedanceimproved since Furosemide dosage increased to 40 mg daily and returned close to baseline normal.  Prescribed:   Furosemide20 mg take2tablets (40 mg total) by mouthdaily.  Potassium 20 mEq take 1 tablet daily.    Labs: 04/19/2020 Creatinine 1.16, BUN 20, Potassium 3.7, Sodium 140, GFR 44-51 04/08/2020 Creatinine 1.13, BUN 22, Potassium 3.6, Sodium 138, GFR 45-52 02/06/2020 Creatinine0.89, BUN15, Potassium4.7, Sodium136, GFR>60 02/04/2020 Creatinine0.87, BUN12, Potassium4.0, Sodium135, GFR>60  02/03/2020 Creatinine0.93, BUN16, Potassium4.0, Sodium135, GFR57->60  A complete set of results can be found in Results Review  Recommendations:Advised to discuss any episodes with Cheryl Kilts, PA at 05/25/2020 appointment.  Fluid levels will be rechecked on 05/25/2020.  Follow-up plan: ICM clinic phone appointment on10/12/2019. 91 day device clinic remote transmission 06/09/2020.   EP/Cardiology Office Visits:05/25/2020 withAndy Tillery,PA.07/06/2020 with Dr Cheryl Vargas.  Copy of ICM check sent to Dr.Allred.  3 month ICM trend: 05/19/2020    1 Year ICM trend:       Cheryl Billings, RN 05/19/2020 12:22 PM

## 2020-05-19 NOTE — Progress Notes (Signed)
[facility] Consult Note Telephone: [phone]  Fax: [phone]  PATIENT NAME: Cheryl Vargas DOB: 26-Mar-1935 MRN: [id number]  PRIMARY CARE PROVIDER:   Lavone Orn, MD Lavone Orn, MD [place] [address],  [place] [address] REFERRING PROVIDER:Bryan Rolla Plate, NP/Dr. Seward Carol RESPONSIBLE PARTY:Son/HCPOA [name] [phone] [facility]) PT in [place] Son [name] [phone] Patient's tel: [phone]  TELEHEALTH VISIT STATEMENT Due to the COVID-19 crisis, this visit was done via telephone from my office. It was initiated and consented to by this patient and/or family.     RECOMMENDATIONS/PLAN:   Advance Care Planning:  Follow up televisit consisted of building trust and discussions on Palliative Medicine as specialized medical care for people living with serious illness, aimed at facilitating better quality of life through symptoms relief, assisting with advance care plan and establishing goals of care.   Code Status: Code status reviewed from palliative records. Patient is a DO NOT RESUSCITATE/DO NOT INTUBATE  Goals of Care: Goals of care include to maximize quality of life and symptom management. Limited Scope of Medical Interventions but would consider ICU admission (but no intubation or mechanical intubation). Yes to Antibiotics and IVFs. Tube feedings depending on long term prognosis of the underlying condition.   Follow up: Palliative care will continue to follow patient for goals of care clarification and symptom management. Follow up in a month.  Symptom management: Patient said overall she is doing but had a near fainting spell yesterday  after having a large bowel movement; she did not pass out but was shaky and sweaty; symptoms resolved and not present at this time. She said she has appointment to see PCP today.  She said she has been taking more fruits and  vegetable to improve on her diet. Bowel regimen discussed to reduce straining. Visit limited by the weather- storms-  she was concerned the thunder may not go well with her being on the phone. Agreed to be checked on in a month.  CHF appears to be compensated at this time; no report of coughing, SOB. AICD in place.  Family/Community support: Widowed 5 years ago (spouse was under hospice care; lung cancer with brain mets). Patient is a retired Haematologist. She has 3 very supportive children. [name] and Tim live in [place]. Patient lives in top floor (14th) [place] ([place] [name] is her [facility] POA- lives in [place]; Physical therapist.   I spent 35  minutes providing this consultation; time includes time spent with patient/family, chart review, and documentation. More than 50% of the time in this consultation was spent on coordinating communication  HISTORY OF PRESENT ILLNESS:  Cheryl Vargas is a 85 y.o. year old female with multiple medical problems including chronic combined systolic/diastolic CHF, Arthritis, Afib, near syncope. Palliative Care was asked to help address goals of care.   CODE STATUS: DNR  PPS: 60% HOSPICE ELIGIBILITY/DIAGNOSIS: TBD  PAST MEDICAL HISTORY:  Past Medical History:  Diagnosis Date  . Arthritis   . Chronic combined systolic (congestive) and diastolic (congestive) heart failure ([place])   . Mitral regurgitation   . Non-ischemic cardiomyopathy ([place])   . Persistent atrial fibrillation ([place])   . Ventricular tachycardia ([place])     SOCIAL HX:  Social History   Tobacco Use  . Smoking status: Never Smoker  . Smokeless tobacco: Never Used  Substance Use Topics  . Alcohol use: Yes    Alcohol/week: 3.0 standard drinks  Types: 3 Glasses of wine per week    Comment: per week    ALLERGIES:  Allergies  Allergen Reactions  . Sotalol Other (See Comments)    Prolonged QTc  . Alendronate Sodium Other (See Comments)    Arm pain  . Ciprofloxacin Other (See Comments)     Not effective  . Compazine [Prochlorperazine]   . Coreg [Carvedilol] Other (See Comments)    Fatigue   . Metoprolol Other (See Comments)    Profound fatigue     PERTINENT MEDICATIONS:  Outpatient Encounter Medications as of 83/04/2020  Medication Sig  . acetaminophen (TYLENOL) 500 MG tablet Take 500 mg by mouth every 6 (six) hours as needed for headache.  . bisoprolol (ZEBETA) 5 MG tablet Take 0.5 mg by mouth daily.  . digoxin (LANOXIN) 0.125 MG tablet Take 0.5 tablets (0.0625 mg total) by mouth daily. (Patient taking differently: Take 0.0625 mg by mouth daily. 03/31/2020 Pt taking every other day since 02/06/2020)  . famotidine (PEPCID) 20 MG tablet Take 1 tablet (20 mg total) by mouth daily. (Patient taking differently: Take 20 mg by mouth daily as needed for heartburn. )  . fluticasone (FLONASE) 50 MCG/ACT nasal spray Place 1 spray into both nostrils daily as needed for allergies or rhinitis.   . furosemide (LASIX) 20 MG tablet Take 2 tablets (40 mg total) by mouth daily.  . hydrocortisone cream 1 % Apply topically 3 (three) times daily as needed for itching.  Marland Kitchen LORazepam (ATIVAN) 0.5 MG tablet Take 1 tablet (0.5 mg total) by mouth daily as needed (nausea). (Patient taking differently: Take 0.5 mg by mouth every 8 (eight) hours as needed (nausea). Patient plans to take just 1/2 tab prn nausea as whole dose causes excessive somnolence.)  . polyethylene glycol (MIRALAX / GLYCOLAX) 17 g packet Take 17 g by mouth 2 (two) times daily.  . potassium chloride SA (KLOR-CON M20) 20 MEQ tablet Take 1 tablet (20 mEq total) by mouth daily.  . Rivaroxaban (XARELTO) 15 MG TABS tablet Take 1 tablet (15 mg total) by mouth daily with supper.  . senna-docusate (SENOKOT-S) 8.6-50 MG tablet Take 2 tablets by mouth 2 (two) times daily.  . traMADol (ULTRAM) 50 MG tablet Take 1 tablet (50 mg total) by mouth every 6 (six) hours as needed for moderate pain. (Patient not taking: Reported on 03/01/2020)  . VITAMIN D PO  Take 1 tablet by mouth daily.   Marland Kitchen zolpidem (AMBIEN) 5 MG tablet Take 1 tablet (5 mg total) by mouth at bedtime.   No facility-administered encounter medications on file as of 05/19/2020.   Teodoro Spray, NP

## 2020-05-19 NOTE — Telephone Encounter (Signed)
Returned call to patient as requested by voice mail message.  She reports she was not feeling well yesterday.  She felt extremely short of breath and weak for about 15 minutes after having a bowel movement.  She stated she felt fine after the 15 minutes but did feel a little weak for part of the day.  She feels fine today.  Requested she send remote transmission for review and she received monitor error message.  Advised to call [employer] support services for assistance and she has the number.

## 2020-05-20 ENCOUNTER — Other Ambulatory Visit: Payer: Self-pay

## 2020-05-20 ENCOUNTER — Ambulatory Visit (HOSPITAL_COMMUNITY)
Admission: RE | Admit: 2020-05-20 | Discharge: 2020-05-20 | Disposition: A | Discharge status: Home / Self Care | Payer: PPO | Source: Ambulatory Visit | Attending: Internal Medicine | Admitting: Internal Medicine

## 2020-05-20 DIAGNOSIS — I5022 Chronic systolic (congestive) heart failure: Secondary | ICD-10-CM | POA: Insufficient documentation

## 2020-05-20 LAB — BASIC METABOLIC PANEL
Anion gap: 14 (ref 5–15)
BUN: 23 mg/dL (ref 8–23)
CO2: 19 mmol/L — ABNORMAL LOW (ref 22–32)
Calcium: 9 mg/dL (ref 8.9–10.3)
Chloride: 105 mmol/L (ref 98–111)
Creatinine, Ser: 1.2 mg/dL — ABNORMAL HIGH (ref 0.44–1.00)
GFR calc Af Amer: 49 mL/min — ABNORMAL LOW (ref >60)
GFR calc non Af Amer: 42 mL/min — ABNORMAL LOW (ref >60)
Glucose, Bld: 123 mg/dL — ABNORMAL HIGH (ref 70–99)
Potassium: 3.9 mmol/L (ref 3.5–5.1)
Sodium: 138 mmol/L (ref 135–145)

## 2020-05-24 NOTE — Progress Notes (Signed)
Electrophysiology Office Note Date: 05/25/2020  ID:  Cheryl Vargas, DOB [birth year], MRN [id number]  PCP: Lavone Orn, MD Primary Cardiologist: Dr. Aundra Dubin Electrophysiologist: [name], MD   CC: Routine ICD follow-up  Cheryl Vargas is a 85 y.o. female seen today for [name], MD for routine electrophysiology followup.  Since last being seen in our clinic the patient reports doing poorly. Overall she has felt worse, especially over the past 2 weeks. Her lasix has been adjusted multiple times via ICM monitoring. She has episodes of sudden SOB, fatigue, and occasional pre-syncope. These are occurring with standing from sitting most often. Not at rest. She drinks approximately 6-7 eight oz glasses of water daily. She does not think she drinks more than 2 L. She denies chest pain, palpitations,  PND,  nausea, vomiting, syncope, edema, weight gain, or early satiety. She has not had ICD shocks.   Device History: Medtronic BiV ICD implanted 08/2017 for chronic systolic CHF, permanent AF -> AV nodal ablation. History of appropriate therapy: No History of AAD therapy: Yes - amio and mexiletine caused nausea   Past Medical History:  Diagnosis Date  . Arthritis   . Chronic combined systolic (congestive) and diastolic (congestive) heart failure ([place])   . Mitral regurgitation   . Non-ischemic cardiomyopathy ([place])   . Persistent atrial fibrillation ([place])   . Ventricular tachycardia [facility])    Past Surgical History:  Procedure Laterality Date  . AV NODE ABLATION N/A 09/07/2017   Procedure: AV NODE ABLATION;  Surgeon: [name], MD;  Location: [place] CV LAB;  Service: Cardiovascular;  Laterality: N/A;  . BIV ICD INSERTION CRT-D N/A 09/07/2017   Procedure: BIV ICD INSERTION CRT-D;  Surgeon: [name], MD;  Location: [place] CV LAB;  Service: Cardiovascular;  Laterality: N/A;  . BUNIONECTOMY    . CARDIAC CATHETERIZATION     in 2004 at [facility]. "Insignificant  blockage" per patient  . CARDIAC CATHETERIZATION N/A 04/25/2016   Procedure: Left Heart Cath and Coronary Angiography;  Surgeon: Nelva Bush, MD;  Location: [place] CV LAB;  Service: Cardiovascular;  Laterality: N/A;  . CARDIOVERSION N/A 06/29/2017   Procedure: CARDIOVERSION;  Surgeon: Larey Dresser, MD;  Location: [facility] ENDOSCOPY;  Service: Cardiovascular;  Laterality: N/A;  . CARDIOVERSION N/A 08/28/2017   Procedure: CARDIOVERSION;  Surgeon: Larey Dresser, MD;  Location: [facility] ENDOSCOPY;  Service: Cardiovascular;  Laterality: N/A;  . RIGHT HEART CATH N/A 09/29/2019   Procedure: RIGHT HEART CATH;  Surgeon: Larey Dresser, MD;  Location: [place] CV LAB;  Service: Cardiovascular;  Laterality: N/A;  . SHOULDER SURGERY     closed reduction  . TEE WITHOUT CARDIOVERSION N/A 06/29/2017   Procedure: TRANSESOPHAGEAL ECHOCARDIOGRAM (TEE);  Surgeon: Larey Dresser, MD;  Location: [facility] ENDOSCOPY;  Service: Cardiovascular;  Laterality: N/A;  . TONSILLECTOMY    . TOTAL HIP ARTHROPLASTY Left 03/18/2013   Procedure: TOTAL HIP ARTHROPLASTY ANTERIOR APPROACH;  Surgeon: Mauri Pole, MD;  Location: [facility] ORS;  Service: Orthopedics;  Laterality: Left;  . TUBAL LIGATION      Current Outpatient Medications  Medication Sig Dispense Refill  . acetaminophen (TYLENOL) 500 MG tablet Take 500 mg by mouth every 6 (six) hours as needed for headache.    . bisoprolol (ZEBETA) 5 MG tablet Take 0.5 mg by mouth daily.    . digoxin (LANOXIN) 0.125 MG tablet Take 0.125 mg by mouth every other day.    . famotidine (PEPCID) 20 MG tablet Take 20 mg  by mouth as needed for heartburn or indigestion.    . fluticasone (FLONASE) 50 MCG/ACT nasal spray Place 1 spray into both nostrils daily as needed for allergies or rhinitis.     . furosemide (LASIX) 20 MG tablet Take 2 tablets (40 mg total) by mouth daily. 180 tablet 3  . LORazepam (ATIVAN) 0.5 MG tablet Take 1 tablet (0.5 mg total) by mouth daily as needed (nausea). 10  tablet 0  . potassium chloride SA (KLOR-CON M20) 20 MEQ tablet Take 1 tablet (20 mEq total) by mouth daily. 90 tablet 3  . Rivaroxaban (XARELTO) 15 MG TABS tablet Take 1 tablet (15 mg total) by mouth daily with supper. 30 tablet 11  . sennosides-docusate sodium (SENOKOT-S) 8.6-50 MG tablet Take 1 tablet by mouth as needed for constipation.    Marland Kitchen zolpidem (AMBIEN) 5 MG tablet Take 1 tablet (5 mg total) by mouth at bedtime. 15 tablet 0   No current facility-administered medications for this visit.    Allergies:   Sotalol, Alendronate sodium, Ciprofloxacin, Compazine [prochlorperazine], Coreg [carvedilol], and Metoprolol   Social History: Social History   Socioeconomic History  . Marital status: Widowed    Spouse name: Not on file  . Number of children: Not on file  . Years of education: Not on file  . Highest education level: Not on file  Occupational History  . Not on file  Tobacco Use  . Smoking status: Never Smoker  . Smokeless tobacco: Never Used  Vaping Use  . Vaping Use: Never used  Substance and Sexual Activity  . Alcohol use: Yes    Alcohol/week: 3.0 standard drinks    Types: 3 Glasses of wine per week    Comment: per week  . Drug use: No  . Sexual activity: Not on file  Other Topics Concern  . Not on file  Social History Narrative  . Not on file   Social Determinants of Health   Financial Resource Strain:   . Difficulty of Paying Living Expenses: Not on file  Food Insecurity:   . Worried About Charity fundraiser in the Last Year: Not on file  . Ran Out of Food in the Last Year: Not on file  Transportation Needs:   . Lack of Transportation (Medical): Not on file  . Lack of Transportation (Non-Medical): Not on file  Physical Activity:   . Days of Exercise per Week: Not on file  . Minutes of Exercise per Session: Not on file  Stress:   . Feeling of Stress : Not on file  Social Connections:   . Frequency of Communication with Friends and Family: Not on file    . Frequency of Social Gatherings with Friends and Family: Not on file  . Attends Religious Services: Not on file  . Active Member of Clubs or Organizations: Not on file  . Attends Archivist Meetings: Not on file  . Marital Status: Not on file  Intimate Partner Violence:   . Fear of Current or Ex-Partner: Not on file  . Emotionally Abused: Not on file  . Physically Abused: Not on file  . Sexually Abused: Not on file    Family History: Family History  Problem Relation Age of Onset  . Lung cancer Mother   . Heart attack Father     Review of Systems: All other systems reviewed and are otherwise negative except as noted above.   Physical Exam: Vitals:   05/25/20 1106  BP: 100/60  Pulse: 80  SpO2: 99%  Weight: 122 lb (55.3 kg)  Height: 5\' 5"  (1.651 m)    Orthostatics Sitting: 106/78 Standing at 0 minutes: 88/60 Standing at 2 minutes: 104/74  GEN- The patient is elderly appearing, alert and oriented x 3 today.   HEENT: normocephalic, atraumatic; sclera clear, conjunctiva pink; hearing intact; oropharynx clear; neck supple, no JVP Lymph- no cervical lymphadenopathy Lungs- Clear to ausculation bilaterally, normal work of breathing.  No wheezes, rales, rhonchi Heart- Regular rate and rhythm, no murmurs, rubs or gallops, PMI not laterally displaced GI- soft, non-tender, non-distended, bowel sounds present, no hepatosplenomegaly Extremities- no clubbing or cyanosis. No edema; DP/PT/radial pulses 2+ bilaterally. Lower legs cool, but cold. MS- no significant deformity or atrophy Skin- warm and dry, no rash or lesion; ICD pocket well healed Psych- euthymic mood, full affect Neuro- strength and sensation are intact  ICD interrogation- reviewed in detail today,  See PACEART report  EKG:  EKG is not ordered today.  Recent Labs: 09/27/2019: B Natriuretic Peptide 1,032.7 09/28/2019: TSH 2.672 09/30/2019: Magnesium 1.9 02/03/2020: ALT 18 02/04/2020: Hemoglobin 14.2;  Platelets 162 05/20/2020: BUN 23; Creatinine, Ser 1.20; Potassium 3.9; Sodium 138   Wt Readings from Last 3 Encounters:  05/25/20 122 lb (55.3 kg)  02/06/20 134 lb 14.7 oz (61.2 kg)  01/12/20 131 lb 1.6 oz (59.5 kg)     Other studies Reviewed: Additional studies/ records that were reviewed today include: Previous EP notes, Previous HF notes, most recent lab, most recent echo, [place] 09/2019.   Assessment and Plan:  1.  VT / PVCs Controlled with amiodarone previously, but now she has stopped amiodarone due to fatigue and feeling very tired. She has not previously tolerate mexiletine due to nausea.  Also had nausea with ranolazine. Remotes are up to date No further sustained VT. She had NSVT most recently 05/07/2020. No clear correlation with symptoms.   2. Chronic systolic dysfunction/ nonischemic CM s/p Medtronic ICD Euvolemic today Stable on appropriate regimen, followed by HF clinic.  Normal ICD function See Pace Art Report No changes today.   3. Complete heart block S/p AV nodal ablation and CRT  4. Permanent afib Continue Xarelto for chads2vasc score of at least 4.   5. Presyncopal episodes Similar to events in 04/2019. Back then, she would get abdominal pain followed by a feeling of warmth and diaphoresis, then she would get dizzy. Now with symptoms as above. Lightheadedness, especially with standing. Her lasix has been adjusted up and down. And she does feel like she is having frequent, "soft" bowel movements. Recent renal function stable.  She was mildly orthostatic today in the office. Will have her hold her lasix dose tomorrow am and adjust further as needed based on labwork.  Recommended compression hose.   Current medicines are reviewed at length with the patient today.   The patient does not have concerns regarding her medicines.  The following changes were made today:  none  Labs/ tests ordered today include:  Orders Placed This Encounter  Procedures  . CBC with  Differential/Platelet  . Comprehensive metabolic panel  . TSH  . Digoxin level   Disposition:   Follow up with Dr. Rayann Heman as scheduled.   I discussed symptoms personally with Dr. Aundra Dubin who follows her HF.  Ok to hold lasix x 1 dose, but she has a very narrow euvolemic window and quickly becomes SOB with lower doses of lasix. Would not permanent decrease at this time. Will await labwork, and if nothing outstanding, would decrease bisoprolol  further (only lower dose would be 2.5 mg tablets and take 0.5 tablets daily to for 1.25 mg dose). She has had low output from heart failure in the past. Will follow up further with Dr. Aundra Dubin based on labwork.   Jacalyn Lefevre, PA-C  05/25/2020 1:43 PM  [address] [place] [address] [phone] (office) [phone] (fax)

## 2020-05-25 ENCOUNTER — Ambulatory Visit: Payer: PPO | Admitting: Student

## 2020-05-25 ENCOUNTER — Other Ambulatory Visit: Payer: Self-pay

## 2020-05-25 ENCOUNTER — Encounter: Payer: Self-pay | Admitting: Student

## 2020-05-25 ENCOUNTER — Telehealth: Payer: Self-pay | Admitting: Internal Medicine

## 2020-05-25 VITALS — BP 100/60 | HR 80 | Ht 65.0 in | Wt 122.0 lb

## 2020-05-25 DIAGNOSIS — Z79899 Other long term (current) drug therapy: Secondary | ICD-10-CM | POA: Diagnosis not present

## 2020-05-25 DIAGNOSIS — I472 Ventricular tachycardia, unspecified: Secondary | ICD-10-CM

## 2020-05-25 DIAGNOSIS — I5022 Chronic systolic (congestive) heart failure: Secondary | ICD-10-CM

## 2020-05-25 DIAGNOSIS — Z95810 Presence of automatic (implantable) cardiac defibrillator: Secondary | ICD-10-CM | POA: Diagnosis not present

## 2020-05-25 LAB — CUP PACEART INCLINIC DEVICE CHECK
Battery Remaining Longevity: 43 mo
Battery Voltage: 2.96 V
Brady Statistic AP VP Percent: 0 %
Brady Statistic AP VS Percent: 0 %
Brady Statistic AS VP Percent: 0 %
Brady Statistic AS VS Percent: 0 %
Brady Statistic RA Percent Paced: 0 %
Brady Statistic RV Percent Paced: 95.76 %
Date Time Interrogation Session: 20210914133359
HighPow Impedance: 63 Ohm
Implantable Lead Implant Date: 20181228
Implantable Lead Implant Date: 20181228
Implantable Lead Implant Date: 20181228
Implantable Lead Location: 753858
Implantable Lead Location: 753859
Implantable Lead Location: 753860
Implantable Lead Model: 4598
Implantable Lead Model: 5076
Implantable Pulse Generator Implant Date: 20181228
Lead Channel Impedance Value: 161.5 Ohm
Lead Channel Impedance Value: 166.114
Lead Channel Impedance Value: 166.114
Lead Channel Impedance Value: 166.114
Lead Channel Impedance Value: 171 Ohm
Lead Channel Impedance Value: 285 Ohm
Lead Channel Impedance Value: 323 Ohm
Lead Channel Impedance Value: 323 Ohm
Lead Channel Impedance Value: 342 Ohm
Lead Channel Impedance Value: 342 Ohm
Lead Channel Impedance Value: 399 Ohm
Lead Channel Impedance Value: 4047 Ohm
Lead Channel Impedance Value: 437 Ohm
Lead Channel Impedance Value: 513 Ohm
Lead Channel Impedance Value: 551 Ohm
Lead Channel Impedance Value: 551 Ohm
Lead Channel Impedance Value: 551 Ohm
Lead Channel Impedance Value: 570 Ohm
Lead Channel Pacing Threshold Amplitude: 0.5 V
Lead Channel Pacing Threshold Amplitude: 0.5 V
Lead Channel Pacing Threshold Pulse Width: 0.4 ms
Lead Channel Pacing Threshold Pulse Width: 0.4 ms
Lead Channel Sensing Intrinsic Amplitude: 5.125 mV
Lead Channel Sensing Intrinsic Amplitude: 5.125 mV
Lead Channel Setting Pacing Amplitude: 1 V
Lead Channel Setting Pacing Amplitude: 2.5 V
Lead Channel Setting Pacing Pulse Width: 0.4 ms
Lead Channel Setting Pacing Pulse Width: 0.4 ms
Lead Channel Setting Sensing Sensitivity: 0.3 mV

## 2020-05-25 NOTE — Telephone Encounter (Signed)
Verified medication with the patient and made changes to medication list.

## 2020-05-25 NOTE — Telephone Encounter (Signed)
  Patient was reading over her AVS from her visit today and noticed that there were a couple of errors. She states the following:  digoxin (LANOXIN) 0.125 MG tablet she takes this 1/2 pill every other day instead of a whole pill every other day  Metoprolol 5 mg 1/2 pill by mouth daily

## 2020-05-25 NOTE — Patient Instructions (Addendum)
Medication Instructions: Your physician has recommended that you have lab work today: CMET, TSH, Digoxin Level, and CBC   *If you need a refill on your cardiac medications before your next appointment, please call your pharmacy*  Follow-Up: At [facility], you and your health needs are our priority.  As part of our continuing mission to provide you with exceptional heart care, we have created designated Provider Care Teams.  These Care Teams include your primary Cardiologist (physician) and Advanced Practice Providers (APPs -  Physician Assistants and Nurse Practitioners) who all work together to provide you with the care you need, when you need it.  We recommend signing up for the patient portal called "MyChart".  Sign up information is provided on this After Visit Summary.  MyChart is used to connect with patients for Virtual Visits (Telemedicine).  Patients are able to view lab/test results, encounter notes, upcoming appointments, etc.  Non-urgent messages can be sent to your provider as well.   To learn more about what you can do with MyChart, go to [email or web address].    Your next appointment:   Your physician recommends that you schedule a follow-up appointment in: January 2022 -- Monday 09/27/20 at 11:30 am  The format for your next appointment:   In Person with [name], MD

## 2020-05-26 ENCOUNTER — Telehealth: Payer: Self-pay

## 2020-05-26 ENCOUNTER — Other Ambulatory Visit: Payer: Self-pay

## 2020-05-26 DIAGNOSIS — R7989 Other specified abnormal findings of blood chemistry: Secondary | ICD-10-CM

## 2020-05-26 DIAGNOSIS — Z79899 Other long term (current) drug therapy: Secondary | ICD-10-CM

## 2020-05-26 LAB — COMPREHENSIVE METABOLIC PANEL
ALT: 21 IU/L (ref 0–32)
AST: 28 IU/L (ref 0–40)
Albumin/Globulin Ratio: 1.2 (ref 1.2–2.2)
Albumin: 3.9 g/dL (ref 3.6–4.6)
Alkaline Phosphatase: 70 IU/L (ref 44–121)
BUN/Creatinine Ratio: 17 (ref 12–28)
BUN: 18 mg/dL (ref 8–27)
Bilirubin Total: 1.5 mg/dL — ABNORMAL HIGH (ref 0.0–1.2)
CO2: 24 mmol/L (ref 20–29)
Calcium: 9 mg/dL (ref 8.7–10.3)
Chloride: 100 mmol/L (ref 96–106)
Creatinine, Ser: 1.06 mg/dL — ABNORMAL HIGH (ref 0.57–1.00)
GFR calc Af Amer: 57 mL/min/{1.73_m2} — ABNORMAL LOW (ref >59)
GFR calc non Af Amer: 49 mL/min/{1.73_m2} — ABNORMAL LOW (ref >59)
Globulin, Total: 3.2 g/dL (ref 1.5–4.5)
Glucose: 104 mg/dL — ABNORMAL HIGH (ref 65–99)
Potassium: 4.2 mmol/L (ref 3.5–5.2)
Sodium: 138 mmol/L (ref 134–144)
Total Protein: 7.1 g/dL (ref 6.0–8.5)

## 2020-05-26 LAB — CBC WITH DIFFERENTIAL/PLATELET
Basophils Absolute: 0.1 10*3/uL (ref 0.0–0.2)
Basos: 1 %
EOS (ABSOLUTE): 0.1 10*3/uL (ref 0.0–0.4)
Eos: 2 %
Hematocrit: 45 % (ref 34.0–46.6)
Hemoglobin: 15.9 g/dL (ref 11.1–15.9)
Immature Grans (Abs): 0 10*3/uL (ref 0.0–0.1)
Immature Granulocytes: 0 %
Lymphocytes Absolute: 1.1 10*3/uL (ref 0.7–3.1)
Lymphs: 18 %
MCH: 32.6 pg (ref 26.6–33.0)
MCHC: 35.3 g/dL (ref 31.5–35.7)
MCV: 92 fL (ref 79–97)
Monocytes Absolute: 0.6 10*3/uL (ref 0.1–0.9)
Monocytes: 9 %
Neutrophils Absolute: 4.2 10*3/uL (ref 1.4–7.0)
Neutrophils: 70 %
Platelets: 188 10*3/uL (ref 150–450)
RBC: 4.87 x10E6/uL (ref 3.77–5.28)
RDW: 11.5 % — ABNORMAL LOW (ref 11.7–15.4)
WBC: 6 10*3/uL (ref 3.4–10.8)

## 2020-05-26 LAB — TSH: TSH: 6.43 u[IU]/mL — ABNORMAL HIGH (ref 0.450–4.500)

## 2020-05-26 LAB — DIGOXIN LEVEL: Digoxin, Serum: 0.5 ng/mL (ref 0.5–0.9)

## 2020-05-26 NOTE — Telephone Encounter (Signed)
Pt is aware and agreeable to results and recommendations Pt requests to have Free t3/t4 drawn at [facility] as it is easier for her to navigate due to ambulation issues.  Will arrange for pt to have lab collected on Friday 9/17 at 9 am  Pt is agreeable to resuming furosemide at normal dosing on 9/16

## 2020-05-26 NOTE — Telephone Encounter (Signed)
-----   Message from Shirley Friar, PA-C sent at 05/26/2020  7:53 AM EDT ----- Digoxin level stable. CMET stable. CBC stable.   TSH elevated. She needs to return for repeat TSH, Free T3, and Free T4 at her earliest convenience.   She should resume her lasix as ordered tomorrow, 9/16. (I asked her to her hold one dose)  Legrand Como "Oda Kilts, PA-C  05/26/2020 7:53 AM

## 2020-05-26 NOTE — Telephone Encounter (Addendum)
**Note De-Identified Cheryl Vargas Obfuscation** Message ----- From: Bobby Rumpf, CMA Sent: 05/25/2020  11:52 AM EDT To: Deliah Boston Chrisotpher Rivero, LPN Subject: Patient Assitance for Xarelto 15 mg              This patient states she is about to reach the donut hole and would like to know if you could help her with patient assistance for her Xarelto. She takes 15 mg QD

## 2020-05-26 NOTE — Telephone Encounter (Signed)
**Note De-Identified  Obfuscation** The pt and I discussed her applying for pt asst for her Xarelto through [employer] and she is interested in applying. I gave her [employer]'s phone number and advised her to call them with questions concerning her eligibility to be approved for their program and to request that they mail her an application if she is eligible.  If she finds that she is not eligible for the [employer] program she is aware to contact [employer], maintenance (I gave her their phonr number) as she can get her Xarelto through them for $85/30 day supply with no requirements.  She thanked me for calling her to discuss her options and is aware to call back if she has questions or needs further assistance.

## 2020-05-28 ENCOUNTER — Ambulatory Visit (HOSPITAL_COMMUNITY)
Admission: RE | Admit: 2020-05-28 | Discharge: 2020-05-28 | Disposition: A | Discharge status: Home / Self Care | Payer: PPO | Source: Ambulatory Visit | Attending: Internal Medicine | Admitting: Internal Medicine

## 2020-05-28 ENCOUNTER — Other Ambulatory Visit: Payer: Self-pay

## 2020-05-28 DIAGNOSIS — R7989 Other specified abnormal findings of blood chemistry: Secondary | ICD-10-CM | POA: Insufficient documentation

## 2020-05-28 DIAGNOSIS — Z79899 Other long term (current) drug therapy: Secondary | ICD-10-CM

## 2020-05-28 LAB — T4, FREE: Free T4: 1.22 ng/dL — ABNORMAL HIGH (ref 0.61–1.12)

## 2020-05-29 LAB — T3, FREE: T3, Free: 2.7 pg/mL (ref 2.0–4.4)

## 2020-06-08 ENCOUNTER — Other Ambulatory Visit: Payer: Self-pay

## 2020-06-08 ENCOUNTER — Telehealth: Payer: Self-pay

## 2020-06-08 ENCOUNTER — Other Ambulatory Visit: Payer: PPO | Admitting: Hospice

## 2020-06-08 DIAGNOSIS — R55 Syncope and collapse: Secondary | ICD-10-CM

## 2020-06-08 DIAGNOSIS — Z515 Encounter for palliative care: Secondary | ICD-10-CM | POA: Diagnosis not present

## 2020-06-08 DIAGNOSIS — I5040 Unspecified combined systolic (congestive) and diastolic (congestive) heart failure: Secondary | ICD-10-CM

## 2020-06-08 NOTE — Telephone Encounter (Signed)
Spoke with patient.  She said she had an epsiode Thursday, Friday and Sunday with her heart beating really fast alternating with slow heart beat.  She reports the episodes were very brief and resolved quickly.  Remote transmission sent and reviewed and advised no alerts were sent, no abnormal episodes were listed.  She is feeling fine now.  Advised to call back if she has any further problems.

## 2020-06-08 NOTE — Progress Notes (Signed)
[facility] Consult Note Telephone: [phone]  Fax: [phone]  PATIENT NAME: Cheryl Vargas DOB: 09/25/1934 MRN: [id number]  PRIMARY CARE PROVIDER:   Lavone Orn, MD Cheryl Orn, MD [place] [address],  [place] [address] REFERRING PROVIDER:Bryan Rolla Plate, NP/Dr. Seward Vargas RESPONSIBLE PARTY:Cheryl/HCPOA Cheryl Vargas [phone] [facility]) PT in [place] Cheryl Vargas All [phone] Patient's tel: [phone] Patient prefers to be called Cheryl Vargas  TELEHEALTH VISIT STATEMENT Due to the COVID-19 crisis, this visit was done via telephone from my office. It was initiated and consented to by this patient and/or family.     RECOMMENDATIONS/PLAN:   Advance Care Planning:  Follow-up telehealth visit consisted of building trust and follow-up on palliative care.   Code Status: Code status reviewed from palliative records. Patient is a DO NOT RESUSCITATE/DO NOT INTUBATE  Goals of Care: Goals of care include to maximize quality of life and symptom management. Limited Scope of Medical Interventions but would consider ICU admission (but no intubation or mechanical intubation). Yes to Antibiotics and IVFs. Tube feedings depending on long term prognosis of the underlying condition.   Follow up: Palliative care will continue to follow patient for goals of care clarification and symptom management. Follow up in 6 weeks; patient prefers telemedicine at this time  Functional decline/symptom management: Patient reports ongoing near fainting spells that makes it difficult for her to walk for a long time.  She said this does not just happen when she has a bowel movement  but also when she is up and about.  She said she does not feel dizzy but rather feels weak/sweaty and sometimes shaky.  Discussed with patient about possible fluctuations in her blood sugar level.  She said that these have been  checked and her blood sugar was not the problem of her near fainting spells.  She believes this is related to her heart condition and plans to see her cardiologist in the coming week.    NP also discussed her blood pressure medications and if she checks her blood pressure on a regular basis at home.  She said that her BP regularly runs about 824 systolic and that her blood pressure medications were recently downsized by her cardiologist - reduced doses of Digoxin and Bisprolol.  She is no longer on amiodarone.    Discussing her quality of life, she accepted that the fainting spells makes it difficult for her to enjoy activities she involved in, and now she has to always look for a place to sit down when she is outside her home.  She has her rolling walker for support.  Discussion on pacing her activities and taking breaks in between as needed.  She said she was in no acute distress; she denied pain/discomfort.  She knows to call if condition worsens or with any concerns.    She denied edema or cough or shortness of breath.  She said she is regular with her Lasix as ordered and does not feel  any congestion.  She endorsed that in the past 1 week, when she wakes up about 5:30 AM to feed her to kitty cats, she has trouble breathing when she lays down on her 2 pillows.  She is only able to breathe well and go back to sleep when she puts in a third pillow.  She is in agreement that she needs to call and get in earlier to see her cardiologist even though she is in  no respiratory distress during the all day and at night.  Patient has AICD in place.  She said she will call her cardiologist office to see if her near fainting episodes are recorded in the AICD monitors in their office.  Also encouraged she sees her PCP for lab works, if indicated.  Palliative will continue to monitor for symptom management/decline and make recommendations as needed.  Family/Community support: Widowed 5 years ago (spouse was under  hospice care; lung cancer with brain mets). Patient is a retired Haematologist. She has 3 very supportive children. Cheryl Vargas and Cheryl Vargas live in [place]. Patient lives in top floor (14th) [place] ([place] Cheryl Vargas is her [facility] POA- lives in [place]; Physical therapist.   I spent 46  minutes providing this consultation; time includes time spent with patient/family, chart review, and documentation. More than 50% of the time in this consultation was spent on coordinating communication  HISTORY OF PRESENT ILLNESS:  Cheryl Vargas is a 85 y.o. year old female with multiple medical problems including chronic combined systolic/diastolic CHF, Arthritis, Afib, near syncope. Palliative Care was asked to help address goals of care.   CODE STATUS: DNR  PPS: weak 60% HOSPICE ELIGIBILITY/DIAGNOSIS: TBD  PAST MEDICAL HISTORY:  Past Medical History:  Diagnosis Date  . Arthritis   . Chronic combined systolic (congestive) and diastolic (congestive) heart failure ([place])   . Mitral regurgitation   . Non-ischemic cardiomyopathy ([place])   . Persistent atrial fibrillation ([employer])   . Ventricular tachycardia ([place])     SOCIAL HX:  Social History   Tobacco Use  . Smoking status: Never Smoker  . Smokeless tobacco: Never Used  Substance Use Topics  . Alcohol use: Yes    Alcohol/week: 3.0 standard drinks    Types: 3 Glasses of wine per week    Comment: per week    ALLERGIES:  Allergies  Allergen Reactions  . Sotalol Other (See Comments)    Prolonged QTc  . Alendronate Sodium Other (See Comments)    Arm pain  . Ciprofloxacin Other (See Comments)    Not effective  . Compazine [Prochlorperazine]   . Coreg [Carvedilol] Other (See Comments)    Fatigue   . Metoprolol Other (See Comments)    Profound fatigue     PERTINENT MEDICATIONS:  Outpatient Encounter Medications as of 06/08/2020  Medication Sig  . acetaminophen (TYLENOL) 500 MG tablet Take 500 mg by mouth every 6 (six) hours as needed for headache.  .  bisoprolol (ZEBETA) 5 MG tablet Take 2.5 mg by mouth daily.   . digoxin (LANOXIN) 0.125 MG tablet Take 0.0625 mg by mouth every other day.   . famotidine (PEPCID) 20 MG tablet Take 20 mg by mouth as needed for heartburn or indigestion.  . fluticasone (FLONASE) 50 MCG/ACT nasal spray Place 1 spray into both nostrils daily as needed for allergies or rhinitis.   . furosemide (LASIX) 20 MG tablet Take 2 tablets (40 mg total) by mouth daily.  Marland Kitchen LORazepam (ATIVAN) 0.5 MG tablet Take 1 tablet (0.5 mg total) by mouth daily as needed (nausea).  . potassium chloride SA (KLOR-CON M20) 20 MEQ tablet Take 1 tablet (20 mEq total) by mouth daily.  . Rivaroxaban (XARELTO) 15 MG TABS tablet Take 1 tablet (15 mg total) by mouth daily with supper.  . sennosides-docusate sodium (SENOKOT-S) 8.6-50 MG tablet Take 1 tablet by mouth as needed for constipation.  Marland Kitchen zolpidem (AMBIEN) 5 MG tablet Take 1 tablet (5 mg total) by mouth at  bedtime.   No facility-administered encounter medications on file as of 06/08/2020.    Teodoro Spray, NP

## 2020-06-08 NOTE — Telephone Encounter (Signed)
Attempted return call to patient as requested by voice mail message.  Pt answered and was in the grocery store.  She said she has been having some "episodes" and wondered if anything showed up on a remote transmission.  Advised to send remote transmission when she gets home and will review it and call her back.

## 2020-06-09 ENCOUNTER — Ambulatory Visit (INDEPENDENT_AMBULATORY_CARE_PROVIDER_SITE_OTHER): Payer: PPO | Admitting: Emergency Medicine

## 2020-06-09 DIAGNOSIS — I472 Ventricular tachycardia, unspecified: Secondary | ICD-10-CM

## 2020-06-10 LAB — CUP PACEART REMOTE DEVICE CHECK
Battery Remaining Longevity: 42 mo
Battery Voltage: 2.95 V
Brady Statistic AP VP Percent: 0 %
Brady Statistic AP VS Percent: 0 %
Brady Statistic AS VP Percent: 0 %
Brady Statistic AS VS Percent: 0 %
Brady Statistic RA Percent Paced: 0 %
Brady Statistic RV Percent Paced: 91.08 %
Date Time Interrogation Session: 20210929044227
HighPow Impedance: 63 Ohm
Implantable Lead Implant Date: 20181228
Implantable Lead Implant Date: 20181228
Implantable Lead Implant Date: 20181228
Implantable Lead Location: 753858
Implantable Lead Location: 753859
Implantable Lead Location: 753860
Implantable Lead Model: 4598
Implantable Lead Model: 5076
Implantable Pulse Generator Implant Date: 20181228
Lead Channel Impedance Value: 161.5 Ohm
Lead Channel Impedance Value: 161.5 Ohm
Lead Channel Impedance Value: 178.5 Ohm
Lead Channel Impedance Value: 178.5 Ohm
Lead Channel Impedance Value: 178.5 Ohm
Lead Channel Impedance Value: 323 Ohm
Lead Channel Impedance Value: 323 Ohm
Lead Channel Impedance Value: 323 Ohm
Lead Channel Impedance Value: 323 Ohm
Lead Channel Impedance Value: 342 Ohm
Lead Channel Impedance Value: 399 Ohm
Lead Channel Impedance Value: 399 Ohm
Lead Channel Impedance Value: 4047 Ohm
Lead Channel Impedance Value: 513 Ohm
Lead Channel Impedance Value: 551 Ohm
Lead Channel Impedance Value: 551 Ohm
Lead Channel Impedance Value: 608 Ohm
Lead Channel Impedance Value: 646 Ohm
Lead Channel Pacing Threshold Amplitude: 0.375 V
Lead Channel Pacing Threshold Amplitude: 0.5 V
Lead Channel Pacing Threshold Pulse Width: 0.4 ms
Lead Channel Pacing Threshold Pulse Width: 0.4 ms
Lead Channel Sensing Intrinsic Amplitude: 5.125 mV
Lead Channel Sensing Intrinsic Amplitude: 5.125 mV
Lead Channel Setting Pacing Amplitude: 1 V
Lead Channel Setting Pacing Amplitude: 2.5 V
Lead Channel Setting Pacing Pulse Width: 0.4 ms
Lead Channel Setting Pacing Pulse Width: 0.4 ms
Lead Channel Setting Sensing Sensitivity: 0.3 mV

## 2020-06-14 ENCOUNTER — Ambulatory Visit (INDEPENDENT_AMBULATORY_CARE_PROVIDER_SITE_OTHER): Payer: PPO

## 2020-06-14 DIAGNOSIS — Z95810 Presence of automatic (implantable) cardiac defibrillator: Secondary | ICD-10-CM

## 2020-06-14 DIAGNOSIS — I5022 Chronic systolic (congestive) heart failure: Secondary | ICD-10-CM

## 2020-06-14 NOTE — Progress Notes (Signed)
Remote ICD transmission.   

## 2020-06-17 NOTE — Progress Notes (Signed)
EPIC Encounter for ICM Monitoring  Patient Name: Cheryl Vargas is a 85 y.o. female Date: 06/17/2020 Primary Care Physican: Lavone Orn, MD Primary Cardiologist:[place] Electrophysiologist:Allred Bi-V Pacing:92.2% 05/25/2020 Office Weight: 122 lbs   Transmission reviewed.  OptivolThoracic impedanceimproved since Furosemide dosage increased to 40 mg daily and returned close to baseline normal.  Prescribed:   Furosemide20 mg take2tablets (40 mg total) by mouthdaily.  Potassium 20 mEq take 1 tablet daily.    Labs: 05/25/2020 Creatinine 1.06, BUN 18, Potassium 4.2, Sodium 138, GFR 49-57 05/20/2020 Creatinine 1.20, BUN 23, Potassium 3.9, Sodium 138, GFR 42-49  04/19/2020 Creatinine 1.16, BUN 20, Potassium 3.7, Sodium 140, GFR 44-51 04/08/2020 Creatinine 1.13, BUN 22, Potassium 3.6, Sodium 138, GFR 45-52 02/06/2020 Creatinine0.89, BUN15, Potassium4.7, Sodium136, GFR>60 02/04/2020 Creatinine0.87, BUN12, Potassium4.0, Sodium135, GFR>60  02/03/2020 Creatinine0.93, BUN16, Potassium4.0, Sodium135, GFR57->60  A complete set of results can be found in Results Review  Recommendations:No changes  Follow-up plan: ICM clinic phone appointment on11/04/2020. 91 day device clinic remote transmission 09/08/2020.   EP/Cardiology Office Visits:07/06/2020 with Dr Aundra Dubin.  Copy of ICM check sent to Dr.Allred.   3 month ICM trend: 06/14/2020    1 Year ICM trend:       Rosalene Billings, RN 06/17/2020 2:49 PM

## 2020-06-30 DIAGNOSIS — Z23 Encounter for immunization: Secondary | ICD-10-CM | POA: Diagnosis not present

## 2020-07-06 ENCOUNTER — Other Ambulatory Visit: Payer: Self-pay

## 2020-07-06 ENCOUNTER — Encounter (HOSPITAL_COMMUNITY): Payer: Self-pay | Admitting: Cardiology

## 2020-07-06 ENCOUNTER — Ambulatory Visit (HOSPITAL_COMMUNITY)
Admission: RE | Admit: 2020-07-06 | Discharge: 2020-07-06 | Disposition: A | Discharge status: Home / Self Care | Payer: PPO | Source: Ambulatory Visit | Attending: Cardiology | Admitting: Cardiology

## 2020-07-06 ENCOUNTER — Telehealth (HOSPITAL_COMMUNITY): Payer: Self-pay | Admitting: Pharmacy Technician

## 2020-07-06 ENCOUNTER — Telehealth (HOSPITAL_COMMUNITY): Payer: Self-pay

## 2020-07-06 VITALS — BP 110/78 | HR 85 | Wt 125.4 lb

## 2020-07-06 DIAGNOSIS — Z79899 Other long term (current) drug therapy: Secondary | ICD-10-CM | POA: Diagnosis not present

## 2020-07-06 DIAGNOSIS — I340 Nonrheumatic mitral (valve) insufficiency: Secondary | ICD-10-CM | POA: Insufficient documentation

## 2020-07-06 DIAGNOSIS — I482 Chronic atrial fibrillation, unspecified: Secondary | ICD-10-CM | POA: Diagnosis not present

## 2020-07-06 DIAGNOSIS — Z7901 Long term (current) use of anticoagulants: Secondary | ICD-10-CM | POA: Insufficient documentation

## 2020-07-06 DIAGNOSIS — I493 Ventricular premature depolarization: Secondary | ICD-10-CM | POA: Insufficient documentation

## 2020-07-06 DIAGNOSIS — I5022 Chronic systolic (congestive) heart failure: Secondary | ICD-10-CM | POA: Diagnosis not present

## 2020-07-06 DIAGNOSIS — I4821 Permanent atrial fibrillation: Secondary | ICD-10-CM | POA: Diagnosis not present

## 2020-07-06 DIAGNOSIS — I5042 Chronic combined systolic (congestive) and diastolic (congestive) heart failure: Secondary | ICD-10-CM

## 2020-07-06 LAB — BASIC METABOLIC PANEL
Anion gap: 11 (ref 5–15)
BUN: 29 mg/dL — ABNORMAL HIGH (ref 8–23)
CO2: 23 mmol/L (ref 22–32)
Calcium: 8.9 mg/dL (ref 8.9–10.3)
Chloride: 103 mmol/L (ref 98–111)
Creatinine, Ser: 1.14 mg/dL — ABNORMAL HIGH (ref 0.44–1.00)
GFR, Estimated: 48 mL/min — ABNORMAL LOW (ref >60)
Glucose, Bld: 115 mg/dL — ABNORMAL HIGH (ref 70–99)
Potassium: 3.5 mmol/L (ref 3.5–5.1)
Sodium: 137 mmol/L (ref 135–145)

## 2020-07-06 LAB — DIGOXIN LEVEL: Digoxin Level: 1.4 ng/mL (ref 1.0–2.0)

## 2020-07-06 MED ORDER — EMPAGLIFLOZIN 10 MG PO TABS
10.0000 mg | ORAL_TABLET | Freq: Every day | ORAL | 6 refills | Status: DC
Start: 2020-07-06 — End: 2020-08-18

## 2020-07-06 NOTE — Progress Notes (Signed)
Medication Samples have been provided to the patient.  Drug name: Xarelto       Strength: 15mg         Qty: 4  LOT: [id number]  Exp.Date: 4/22  Dosing instructions: take 1 tab Daily   The patient has been instructed regarding the correct time, dose, and frequency of taking this medication, including desired effects and most common side effects.   Lenus Trauger 12:38 PM 07/06/2020

## 2020-07-06 NOTE — Telephone Encounter (Signed)
Patient was seen in clinic and started on Jardiance. She is in the donut hole right now, her co-pay is $142. Started an application for [employer] for her, will fax in once signatures are obtained.

## 2020-07-06 NOTE — Telephone Encounter (Signed)
Malena Edman, RN  07/06/2020 4:56 PM EDT Back to Top    Patient advised and verbalized understanding. Pt states she is taking Digoxin every other day. Lab appt already scheduled. Lab orders placed

## 2020-07-06 NOTE — Telephone Encounter (Signed)
-----   Message from Larey Dresser, MD sent at 07/06/2020  4:11 PM EDT ----- Make sure she is taking digoxin 0.0625 every other day.  Have her come back for digoxin level check as a trough (draw lab in am before she takes her am digoxin).

## 2020-07-06 NOTE — Patient Instructions (Addendum)
Start Jardiance 10 mg Daily  Your provider has prescribed Jardiance for you. Please be aware the most common side effect of this medication is urinary tract infections and yeast infections. Please practice good hygiene and keep this area clean and dry to help prevent this. If you do begin to have symptoms of these infections, such as difficulty urinating or painful urination,  please let us know.  Labs done today, your results will be available in MyChart, we will contact you for abnormal readings.  Your physician recommends that you return for lab work in: 10 days  Your physician recommends that you schedule a follow-up appointment in: 1 month  If you have any questions or concerns before your next appointment please send [place] a message through [place] or call our office at [phone].    TO LEAVE A MESSAGE FOR THE NURSE SELECT OPTION 2, PLEASE LEAVE A MESSAGE INCLUDING: . YOUR NAME . DATE OF BIRTH . CALL BACK NUMBER . REASON FOR CALL**this is important as we prioritize the call backs  [place] AS LONG AS YOU CALL BEFORE 4:00 PM  At the [facility], you and your health needs are our priority. As part of our continuing mission to provide you with exceptional heart care, we have created designated Provider Care Teams. These Care Teams include your primary Cardiologist (physician) and Advanced Practice Providers (APPs- Physician Assistants and Nurse Practitioners) who all work together to provide you with the care you need, when you need it.   You may see any of the following providers on your designated Care Team at your next follow up: Marland Kitchen Dr Glori Bickers . Dr [name] . Darrick Grinder, NP . Lyda Jester, PA . Audry Riles, PharmD   Please be sure to bring in all your medications bottles to every appointment.

## 2020-07-06 NOTE — Progress Notes (Signed)
Medication Samples have been provided to the patient.  Drug name: Jardiance       Strength: 10 mg        Qty: 4  LOT: [id number]  Exp.Date: 8/23  Dosing instructions: take 1 tab Daily  The patient has been instructed regarding the correct time, dose, and frequency of taking this medication, including desired effects and most common side effects.   Arrie Borrelli 11:56 AM 07/06/2020

## 2020-07-06 NOTE — Progress Notes (Signed)
Advanced Heart Failure Clinic Note   Primary Care: Dr Laurann Montana HF Cardiology: Dr. Aundra Dubin  HPI: [name] is an 85 y.o. female, retired Marine scientist, with a past medical history of HTN, HLD, PAF (On Xarelto), and nonischemic cardiomyopathy with chronic systolic CHF. Cardiomyopathy dates back to at least 2014 based on echoes (EF 30-35% in 2014).   Admitted 8/17 for fatigue and dyspnea.  She was seen by electrophysiology on 04/17/16, and was started on po Amiodarone as she reported increased palpitations and fatigue with frequent PVCs. Her BNP was elevated at 1592.6 and chest x ray was consistent with CHF.  She was diuresed.  With her complaints of new onset fatigue and dyspnea combined with her known LV dysfunction, it was felt that she would benefit from an ischemic evaluation by cath. She underwent left heart cath with normal cors. Amiodarone was later stopped due to nausea.  Discharge weight 143 pounds. Echo (8/17) with EF 40-45%, inferoseptal akinesis. Cardiac MRI 10/17 with EF 36%, LGE mid wall pattern in the basal to mid septum and inferior wall. Holter monitor in 9/17 with runs of atrial fibrillation/RVR.  She has had trouble tolerating cardiac meds due to hypotension/lightheadedness.    She has had difficulty with symptomatic atrial fibrillation. She developed symptomatic atrial fibrillation with RVR again in 10/18, difficult to control rate.  She had TEE-guided DCCV with resumption of NSR. On TEE in rapid atrial fibrillation, EF 20-25%.  On TTE after DCCV, EF back to 40-45% range.  MR reported as moderate to severe but I reviewed echo and think it appears more in the moderate range.    She had recurrent atrial fibrillation after 10/18 DCCV. She was admitted in 12/18 with atrial fibrillation and RVR, she also had a run of WCT thought to be VT.   The atrial fibrillation was very difficult to control.  She was thought to be a poor candidate for atrial fibrillation ablation.  She finally had AV nodal  ablation with Medtronic CRT-D device in 12/18. She was then admitted briefly with CHF in 1/19.  Echo in 12/18 showed EF 25-30%, moderate MR, moderate TR, severe LAE.  Echo in 4/19 showed EF 25-30%, diffuse hypokinesis, mild MR, moderately decreased RV systolic function.   Admitted 4/7 - 12/21/17 with A/C CHF. Found to have decreased BiV pacing in setting of frequent PVCs. Tried on Mexiletine but failed due to nausea. Switched to Ranexa. Diuresed with IV lasix.   In 5/19, she was admitted with a near-syncopal episode that occurred while straining for a bowel movement.  Suspected vagal event.   She has stopped ranolazine due to nausea.  She did not tolerate increase in bisoprolol.   She was admitted in 8/20 with presyncope; she would get abdominal pain followed by a feeling of warmth and diaphoresis, then she would get dizzy.  No syncope. No explanatory arrhythmias were seen on device interrogation or telemetry in the hospital.  She was not orthostatic or dry.  Thought to be possibly a vagal response to GI trigger (dyspepsia).  Mesenteric dopplers in 8/20 were normal. She feels like Pepcid has helped.   She was admitted in 1/21 with VT and ICD discharge.  RHC showed low output with CI 1.72.  Echo showed EF < 20%, moderate LV dilation, mild RV dilation with decreased systolic function, mild-moderate MR.  She was started on amiodarone and digoxin.   She was admitted in 5/21 with UTI and hypotension.  Losartan and spironolactone were stopped.    She returns  for followup of atrial fibrillation and CHF today.  She continues to have episodes of diaphoresis and dyspnea with exertion.  These episodes are frequent but do not occur every time she walks.  Episodes resolve when she sits down.  Weight down 10 lbs.  She is now taking Lasix 40 mg daily. She fatigues easily.  No chest pain.  No orthopnea/PND.   Medtronic device interrogation: No VT, fluid index near threshold, only about 80% BiV pacing (suspect due to  PVCs).   ECG (personally reviewed): atrial fibrillation, BiV pacing  Labs (8/17): K 4.2, creatinine 0.85, BNP 1593, HCT 43.2, TSH mild elevated 5.3, free T4 normal Labs (9/17): K 4.1, creatinine 0.98 => 0.92, BNP 258, SPEP negative Labs (2/18): K 4, creatinine 0.83, HCT 40.8 Labs (10/18): K 4.2, creatinine 0.94, hgb 14.9 Labs (1/19): K 4.9, creatinine 0.72 Labs (2/19): K 4.1, creatinine 0.97 Labs (5/19): K 3.4, creatinine 1.17 Labs (6/19): K 3.9, creatinine 1.2 Labs (8/19): K 3.7, creatinine 1.14 Labs (1/20): K 3.9, creatinine 1.25 Labs (8/20): K 4.1, creatinine 1.0, hgb 14.4 Labs (1/21): digoxin <0.2, K 3.6, creatinine 1.36 Labs (9/21): digoxin 0.5, K 4.2, creatinine 1.06, hb 15.9  1. Chronic systolic CHF:  Nonischemic cardiomyopathy.   - Echo (2014): EF 30-35%. - Echo (2/17): EF 40-45% - Echo (8/17): EF 40-45%, mid to apical inferoseptal akinesis - Coronary angiography (8/17): No significant CAD.  - Cardiac MRI (10/17): EF 36%, moderate MR, normal RV size and systolic function, mid-wall LGE in the basal to mid septum and inferior wall.  - TEE (10/18): EF 20-25% but in rapid afib with HR up to 150 bpm, mild MR.  - Echo (10/18): EF 40-45%, diffuse hypokinesis, moderate LAE, reportedly moderate-severe MR (looks moderate on my review).  - Echo (12/18): EF 25-30%, moderate MR, moderate TR, severe LAE - Medtronic CRT-D device s/p AV nodal ablation.  - Echo (4/19): EF 25-30%, diffuse hypokinesis, mild MR, moderately decreased RV systolic function.  - Echo (8/20): EF 25-30%, normal RV size and systolic function, mild MR, moderate-severe TR.  - RHC (1/21): mean RA 9, PA 39/19, mean PCWP 22, CI 1.72, PVR 2.4 WU - Echo (1/21): EF <20%, moderate LV dilation, mild RV dilation with mildly decreased systolic function, mild-moderate MR.  2. Atrial fibrillation: Paroxysmal.  Diagnosed 2/17.  Unable to tolerate amiodarone.  - Holter (9/17): atrial fibrillation with RVR runs, 5% PVCs.  - AV nodal  ablation with BiV pacing in 12/18.  - Nausea with ranolazine.  3. Hyperlipidemia 4. PVCs: frequent.  5. Long QT interval 6. Mesenteric dopplers (8/20): normal.  7. VT in 1/21 with ICD discharge.   Review of systems complete and found to be negative unless listed in HPI.    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 Occasional ETOH, never heavy.    Current Outpatient Medications  Medication Sig Dispense Refill  . acetaminophen (TYLENOL) 500 MG tablet Take 500 mg by mouth every 6 (six) hours as needed for headache.    . bisoprolol (ZEBETA) 5 MG tablet Take 2.5 mg by mouth daily.     . digoxin (LANOXIN) 0.125 MG tablet Take 0.0625 mg by mouth every other day.     . famotidine (PEPCID) 20 MG tablet Take 20 mg by mouth as needed for heartburn or indigestion.    . fluticasone (FLONASE) 50 MCG/ACT nasal spray Place 1 spray into both nostrils daily as needed for allergies or rhinitis.     Marland Kitchen  furosemide (LASIX) 20 MG tablet Take 2 tablets (40 mg total) by mouth daily. 180 tablet 3  . LORazepam (ATIVAN) 0.5 MG tablet Take 1 tablet (0.5 mg total) by mouth daily as needed (nausea). 10 tablet 0  . potassium chloride SA (KLOR-CON M20) 20 MEQ tablet Take 1 tablet (20 mEq total) by mouth daily. 90 tablet 3  . Rivaroxaban (XARELTO) 15 MG TABS tablet Take 1 tablet (15 mg total) by mouth daily with supper. 30 tablet 11  . sennosides-docusate sodium (SENOKOT-S) 8.6-50 MG tablet Take 1 tablet by mouth as needed for constipation.    Marland Kitchen zolpidem (AMBIEN) 5 MG tablet Take 1 tablet (5 mg total) by mouth at bedtime. 15 tablet 0  . empagliflozin (JARDIANCE) 10 MG TABS tablet Take 1 tablet (10 mg total) by mouth daily before breakfast. 30 tablet 6   No current facility-administered medications for this encounter.    Vitals:   07/06/20 1111  BP: 110/78  Pulse: 85  SpO2: 97%  Weight: 56.9 kg (125 lb 6.4 oz)   Wt Readings from Last 3 Encounters:   07/06/20 56.9 kg (125 lb 6.4 oz)  05/25/20 55.3 kg (122 lb)  02/06/20 61.2 kg (134 lb 14.7 oz)    PHYSICAL EXAM: General: NAD, frail Neck: No JVD, no thyromegaly or thyroid nodule.  Lungs: Clear to auscultation bilaterally with normal respiratory effort. CV: Nondisplaced PMI.  Heart regular S1/S2, no S3/S4, no murmur.  No peripheral edema.  No carotid bruit.  Normal pedal pulses.  Abdomen: Soft, nontender, no hepatosplenomegaly, no distention.  Skin: Intact without lesions or rashes.  Neurologic: Alert and oriented x 3.  Psych: Normal affect. Extremities: No clubbing or cyanosis.  HEENT: Normal.   ASSESSMENT & PLAN:  1. Chronic systolic HF: NICM, LHC 10/4399 showed no significant coronary disease. Cardiac MRI 10/17 EF 36%, diffuse HK, normal RV, biatrial enlargement, moderate MR, non-coronary LGE pattern involving the mid-wall of the basal to mid septum and inferior wall. Possibly prior myocarditis versus a form of infiltrative disease. Medtronic CRT-D s/p AV nodal ablation.  Echo in 1/21 showed EF < 20% with moderate LV dilation and mildly decreased RV systolic function.  Low output on 1/21 RHC (CI 1.72).  NYHA class III symptoms.  Not volume overloaded on exam but fluid index on device interrogation nearing threshold.  She is not BiV pacing at an ideal percentage, likely due to PVCs.   - Continue Lasix 40 mg daily.  - She is off losartan and spironolactone with symptomatic hypotension, will leave off for now.  - I will start her on Farxiga 10 mg daily or Jardiance 10 mg daily, depending on what her insurance will accept.   -Continue bisoprolol 2.5 mg daily.  Unable to tolerate increase.    - Continue digoxin 0.0625 qod, digoxin level today.  - Low output on 1/21 RHC.  Given her age and frailty, think that LVAD would be difficult for her. Will try to continue medical management.  - She does not qualify for [place] given age, but I will have her evaluated for participation in ANTHEM trial  as vagal stimulation may improve her symptoms.  2. Atrial fibrillation: Chronic, s/p AV nodal ablation and BiV pacing.  - Continue Xarelto 15 mg daily.  3. PVCs/VT: Frequent PVCs have limited BiV pacing.  Not a good PVC ablation candidate per EP. Had VT in 1/21.  Cannot tolerate amiodarone, sotolol, or Tikosyn. Did not tolerate mexilitine with nausea. Had nausea also with ranolazine. Additionally, did not  tolerate further uptitration of bisoprolol.  - I am not sure we have a lot of further options here to suppress her PVCs or prevent VT.  4. Mitral regurgitation: Mild-moderate on 1/21 echo.   Followup in 1 month.    [name], MD  07/06/2020

## 2020-07-12 NOTE — Telephone Encounter (Signed)
Sent in application via [employer].  Will follow up.

## 2020-07-14 ENCOUNTER — Other Ambulatory Visit: Payer: Self-pay

## 2020-07-14 ENCOUNTER — Telehealth (HOSPITAL_COMMUNITY): Payer: Self-pay

## 2020-07-14 ENCOUNTER — Ambulatory Visit (HOSPITAL_COMMUNITY)
Admission: RE | Admit: 2020-07-14 | Discharge: 2020-07-14 | Disposition: A | Discharge status: Home / Self Care | Payer: PPO | Source: Ambulatory Visit | Attending: Internal Medicine | Admitting: Internal Medicine

## 2020-07-14 DIAGNOSIS — I5042 Chronic combined systolic (congestive) and diastolic (congestive) heart failure: Secondary | ICD-10-CM | POA: Insufficient documentation

## 2020-07-14 LAB — DIGOXIN LEVEL: Digoxin Level: 1.6 ng/mL (ref 1.0–2.0)

## 2020-07-14 NOTE — Telephone Encounter (Signed)
Advanced Heart Failure Patient Advocate Encounter   Patient was approved to receive Jardiance from [place]  Effective dates: 07/14/20 through 09/10/21  Will call to confirm the patient's eligibility end date. Typically with medicare, assistance is only through the same caldenar year.

## 2020-07-14 NOTE — Telephone Encounter (Signed)
Malena Edman, RN  07/14/2020 2:44 PM EDT Back to Top    Patient advised and states she did not take Digoxin this morning before lab work. Was advised to stop taking digoxin per Dr. Aundra Dubin. Patient verbalized understanding med list updated

## 2020-07-14 NOTE — Addendum Note (Signed)
Addended by: Malena Edman on: 07/14/2020 02:46 PM   Modules accepted: Orders

## 2020-07-14 NOTE — Telephone Encounter (Signed)
-----   Message from Larey Dresser, MD sent at 07/14/2020 12:59 PM EDT ----- If this was as trough lab (drawn BEFORE she took her am digoxin dose), then she will need to stop digoxin (level is too high and she is already on a low dose).

## 2020-07-19 ENCOUNTER — Other Ambulatory Visit: Payer: Self-pay

## 2020-07-19 ENCOUNTER — Ambulatory Visit (INDEPENDENT_AMBULATORY_CARE_PROVIDER_SITE_OTHER): Payer: PPO

## 2020-07-19 ENCOUNTER — Other Ambulatory Visit: Payer: PPO | Admitting: Hospice

## 2020-07-19 ENCOUNTER — Telehealth (HOSPITAL_COMMUNITY): Payer: Self-pay | Admitting: *Deleted

## 2020-07-19 DIAGNOSIS — I5022 Chronic systolic (congestive) heart failure: Secondary | ICD-10-CM | POA: Diagnosis not present

## 2020-07-19 DIAGNOSIS — I5040 Unspecified combined systolic (congestive) and diastolic (congestive) heart failure: Secondary | ICD-10-CM | POA: Diagnosis not present

## 2020-07-19 DIAGNOSIS — Z95810 Presence of automatic (implantable) cardiac defibrillator: Secondary | ICD-10-CM

## 2020-07-19 DIAGNOSIS — Z515 Encounter for palliative care: Secondary | ICD-10-CM

## 2020-07-19 NOTE — Telephone Encounter (Signed)
Take Lasix 40 mg bid today, then 40 mg qam/20 mg qpm after that. BMET 1 week.

## 2020-07-19 NOTE — Telephone Encounter (Signed)
Pt aware and will call back in the morning to schedule lab appt after she speaks to friend that drives her to appts.

## 2020-07-19 NOTE — Progress Notes (Signed)
[facility] Consult Note Telephone: [phone]  Fax: [phone]  PATIENT NAME: Cheryl Vargas Axon DOB: 04-08-1935 MRN: [id number]  PRIMARY CARE PROVIDER:   Lavone Orn, MD Lavone Orn, MD [place] [address],  [place] [address]  REFERRING PROVIDER: Lavone Orn, MD Lavone Orn, MD [place] [address] [place],  [place] [address]  Vincent Rolla Plate, NP/Dr. Seward Carol RESPONSIBLE PARTY:Son/HCPOA [name] [phone] ([place])PT in [place] [phone] Son [name] [phone] Patient's tel: [phone] Patient prefers to be called Cheryl Vargas - [phone]  TELEHEALTH VISIT STATEMENT Due to the COVID-19 crisis, this visit was done via telephone from my office. It was initiated and consented to by this patient and/or family.    RECOMMENDATIONS/PLAN:  Advance Care Planning:  Visit consisted of building trust and discussions on Palliative Medicine as specialized medical care for people living with serious illness, aimed at facilitating better quality of life through symptoms relief, assisting with advance care plan and establishing goals of care.  Code Status:Code status reviewed from palliative records. Patient is a DO NOT RESUSCITATE/DO NOT INTUBATE  Goals of Care: Goals of care include to maximize quality of life and symptom management.Limited Scope of Medical Interventions but would consider ICU admission (but no intubation or mechanical intubation). Yes to Antibiotics and IVFs. Tube feedings depending on long term prognosis of the underlying condition.  Follow UR:[id number] care will continue to follow patient for goals of care clarification and symptom management. In person visit 07/29/2020  Functional decline/symptom management:  Patient reports ongoing fatigue, palpitations and sometimes hyperventilation at mild activity; likely related to  cardiac limitations.  She denies wheezing, SOB, edema. Patient was recently started on Jardiance by her cardiologist; she has scheduled telehealth visit with cardiologist today.   She reported she continues on Xarelto, furosemide, Zebeta and digoxin.  Encouraged taking medications diligently as ordered; to call with unalleviated symptom or go to urgent care if needed. Encourage pacing activities and taking breaks in between as needed.  Palliative will continue to monitor for symptom management/decline and make recommendations as needed.    Family/Community support:Widowed 5 years ago (spouse was under hospice care; lung cancer with brain mets). Patient is a retired Haematologist. She has 3 very supportive children. [name] and Tim live in [place]. I spent41minutes providing this consultation; time includes time spent with patient/family, chart review, and documentation. More than 50% of the time in this consultation was spent on coordinating communication  Cheryl Vargas a 85 y.o.year oldfemalewith multiple medical problems including chronic combined systolic/diastolic CHF, Arthritis, Afib, near syncope. Palliative Care was asked to help address goals of care.   CODE STATUS:DNR  GBT:DVVO 60%  HOSPICE ELIGIBILITY/DIAGNOSIS: TBD  PAST MEDICAL HISTORY:  Past Medical History:  Diagnosis Date  . Arthritis   . Chronic combined systolic (congestive) and diastolic (congestive) heart failure ([facility])   . Mitral regurgitation   . Non-ischemic cardiomyopathy ([facility])   . Persistent atrial fibrillation ([facility])   . Ventricular tachycardia ([facility])     SOCIAL HX:  Social History   Tobacco Use  . Smoking status: Never Smoker  . Smokeless tobacco: Never Used  Substance Use Topics  . Alcohol use: Yes    Alcohol/week: 3.0 standard drinks    Types: 3 Glasses of wine per week    Comment: per week    ALLERGIES:  Allergies  Allergen Reactions  . Sotalol Other (See  Comments)  Prolonged QTc  . Alendronate Sodium Other (See Comments)    Arm pain  . Ciprofloxacin Other (See Comments)    Not effective  . Compazine [Prochlorperazine]   . Coreg [Carvedilol] Other (See Comments)    Fatigue   . Metoprolol Other (See Comments)    Profound fatigue     PERTINENT MEDICATIONS:  Outpatient Encounter Medications as of 07/19/2020  Medication Sig  . acetaminophen (TYLENOL) 500 MG tablet Take 500 mg by mouth every 6 (six) hours as needed for headache.  . bisoprolol (ZEBETA) 5 MG tablet Take 2.5 mg by mouth daily.   . empagliflozin (JARDIANCE) 10 MG TABS tablet Take 1 tablet (10 mg total) by mouth daily before breakfast.  . famotidine (PEPCID) 20 MG tablet Take 20 mg by mouth as needed for heartburn or indigestion.  . fluticasone (FLONASE) 50 MCG/ACT nasal spray Place 1 spray into both nostrils daily as needed for allergies or rhinitis.   . furosemide (LASIX) 20 MG tablet Take 2 tablets (40 mg total) by mouth daily.  Marland Kitchen LORazepam (ATIVAN) 0.5 MG tablet Take 1 tablet (0.5 mg total) by mouth daily as needed (nausea).  . potassium chloride SA (KLOR-CON M20) 20 MEQ tablet Take 1 tablet (20 mEq total) by mouth daily.  . Rivaroxaban (XARELTO) 15 MG TABS tablet Take 1 tablet (15 mg total) by mouth daily with supper.  . sennosides-docusate sodium (SENOKOT-S) 8.6-50 MG tablet Take 1 tablet by mouth as needed for constipation.  Marland Kitchen zolpidem (AMBIEN) 5 MG tablet Take 1 tablet (5 mg total) by mouth at bedtime.   No facility-administered encounter medications on file as of 07/19/2020.     Teodoro Spray, NP

## 2020-07-19 NOTE — Telephone Encounter (Signed)
Pt left VM stating for the last three days she's had "spells of hyperventilating and weakness" that are pretty severe. I called pt back to get more information pt said she's had these episodes in the past with her heart failure. Pt said she isnt short of breath but can't stop breathing fast. Pt took ativan during the day and it helped but she couldn't take it at night due to taking zolpidem but she couldn't sleep well. Pt said these spells start randomly she can be at rest, walking, or laying down and the 'breathing spell" would start.  Pt said her legs are weak and now she has to use her rollator because when the spells start she has to sit down.   Routed to [place]

## 2020-07-21 MED ORDER — FUROSEMIDE 20 MG PO TABS: ORAL_TABLET | ORAL | 3 refills | Status: DC

## 2020-07-21 NOTE — Addendum Note (Signed)
Addended by: Harvie Junior on: 07/21/2020 02:52 PM   Modules accepted: Orders

## 2020-07-21 NOTE — Progress Notes (Signed)
EPIC Encounter for ICM Monitoring  Patient Name: Cheryl Vargas is a 85 y.o. female Date: 07/21/2020 Primary Care Physican: Lavone Orn, MD Primary Cardiologist:[place] Electrophysiologist:[facility] Bi-V Pacing:90.1% 07/21/2020 Weight: 122 lbs   Spoke with patient and reports feeling well at this time.  Denies fluid symptoms.  Patient reported she called Dr Claris Gladden office on 11/8 due to weakness and breathing fast.  Dr Aundra Dubin increased Furosemide dosage.  She is feeling fine now.  OptivolThoracic impedanceimproved since Furosemide dosage increased to 40 mg daily and returned close to baseline normal.  Prescribed:   Furosemide20 mg take2tablets (40 mg total)by mouthevery morning and 1 tablet (20 mg total) every evening (dosage changed 07/19/2020 per phone note).  Potassium 20 mEq take 1 tablet daily.  Labs: 05/25/2020 Creatinine 1.06, BUN 18, Potassium 4.2, Sodium 138, GFR 49-57 05/20/2020 Creatinine 1.20, BUN 23, Potassium 3.9, Sodium 138, GFR 42-49  04/19/2020 Creatinine 1.16, BUN 20, Potassium 3.7, Sodium 140, GFR 44-51 04/08/2020 Creatinine 1.13, BUN 22, Potassium 3.6, Sodium 138, GFR 45-52 02/06/2020 Creatinine0.89, BUN15, Potassium4.7, Sodium136, GFR>60 02/04/2020 Creatinine0.87, BUN12, Potassium4.0, Sodium135, GFR>60  02/03/2020 Creatinine0.93, BUN16, Potassium4.0, Sodium135, GFR57->60  A complete set of results can be found in Results Review  Recommendations:No changes and encouraged to call if experiencing any fluid symptoms.  Follow-up plan: ICM clinic phone appointment on12/13/2021. 91 day device clinic remote transmission 09/08/2020.   EP/Cardiology Office Visits:09/18/2019 with Dr Aundra Dubin.  09/27/2020 with Dr Rayann Heman  Copy of ICM check sent to Dr.Allred.   3 month ICM trend: 07/19/2020    1 Year ICM trend:       Rosalene Billings, RN 07/21/2020 2:16 PM

## 2020-07-22 ENCOUNTER — Telehealth (HOSPITAL_COMMUNITY): Payer: Self-pay | Admitting: *Deleted

## 2020-07-22 NOTE — Telephone Encounter (Signed)
Pt called back to schedule lab appt.

## 2020-07-27 NOTE — Telephone Encounter (Signed)
Called [employer] to confirm the patient's approval end date. The representative stated that since it was so close to the end of the year, the company went ahead and approved her through the next year.  Called and updated the patient.  Charlann Boxer, CPhT

## 2020-07-28 ENCOUNTER — Other Ambulatory Visit: Payer: Self-pay

## 2020-07-28 ENCOUNTER — Ambulatory Visit (HOSPITAL_COMMUNITY)
Admission: RE | Admit: 2020-07-28 | Discharge: 2020-07-28 | Disposition: A | Discharge status: Home / Self Care | Payer: PPO | Source: Ambulatory Visit | Attending: Cardiology | Admitting: Cardiology

## 2020-07-28 DIAGNOSIS — I5042 Chronic combined systolic (congestive) and diastolic (congestive) heart failure: Secondary | ICD-10-CM | POA: Insufficient documentation

## 2020-07-28 LAB — BASIC METABOLIC PANEL
Anion gap: 11 (ref 5–15)
BUN: 31 mg/dL — ABNORMAL HIGH (ref 8–23)
CO2: 27 mmol/L (ref 22–32)
Calcium: 9 mg/dL (ref 8.9–10.3)
Chloride: 99 mmol/L (ref 98–111)
Creatinine, Ser: 1.78 mg/dL — ABNORMAL HIGH (ref 0.44–1.00)
GFR, Estimated: 28 mL/min — ABNORMAL LOW (ref >60)
Glucose, Bld: 135 mg/dL — ABNORMAL HIGH (ref 70–99)
Potassium: 3.3 mmol/L — ABNORMAL LOW (ref 3.5–5.1)
Sodium: 137 mmol/L (ref 135–145)

## 2020-07-29 ENCOUNTER — Other Ambulatory Visit: Payer: PPO | Admitting: Hospice

## 2020-07-29 DIAGNOSIS — Z515 Encounter for palliative care: Secondary | ICD-10-CM

## 2020-07-29 NOTE — Progress Notes (Signed)
Visit did not hold. NP arrived at patient's home, not able to see patient; patient not picking call and no access to home. Patient later called back to apologize. Visit rescheduled to next week 08/03/2020.

## 2020-07-30 ENCOUNTER — Telehealth (HOSPITAL_COMMUNITY): Payer: Self-pay | Admitting: *Deleted

## 2020-07-30 DIAGNOSIS — I5042 Chronic combined systolic (congestive) and diastolic (congestive) heart failure: Secondary | ICD-10-CM

## 2020-07-30 NOTE — Telephone Encounter (Signed)
Pt aware, agreeable, and verbalized understanding, repeat lab sch 12/1

## 2020-07-30 NOTE — Telephone Encounter (Signed)
-----   Message from Larey Dresser, MD sent at 07/28/2020 12:32 PM EST ----- Creatinine higher.  Hold Lasix for a day and decrease to 20 mg daily.  BMET 1 week.

## 2020-08-02 ENCOUNTER — Inpatient Hospital Stay (HOSPITAL_COMMUNITY): Payer: PPO

## 2020-08-02 ENCOUNTER — Emergency Department (HOSPITAL_COMMUNITY): Payer: PPO

## 2020-08-02 ENCOUNTER — Encounter (HOSPITAL_COMMUNITY): Payer: Self-pay | Admitting: Emergency Medicine

## 2020-08-02 ENCOUNTER — Telehealth: Payer: Self-pay | Admitting: Physician Assistant

## 2020-08-02 ENCOUNTER — Inpatient Hospital Stay (HOSPITAL_COMMUNITY)
Admission: EM | Admit: 2020-08-02 | Discharge: 2020-08-18 | DRG: 252 | Disposition: A | Discharge status: Home Health | Payer: PPO | Attending: Cardiology | Admitting: Cardiology

## 2020-08-02 DIAGNOSIS — J9811 Atelectasis: Secondary | ICD-10-CM | POA: Diagnosis not present

## 2020-08-02 DIAGNOSIS — I97610 Postprocedural hemorrhage and hematoma of a circulatory system organ or structure following a cardiac catheterization: Secondary | ICD-10-CM | POA: Diagnosis not present

## 2020-08-02 DIAGNOSIS — Z4682 Encounter for fitting and adjustment of non-vascular catheter: Secondary | ICD-10-CM | POA: Diagnosis not present

## 2020-08-02 DIAGNOSIS — E7800 Pure hypercholesterolemia, unspecified: Secondary | ICD-10-CM | POA: Diagnosis not present

## 2020-08-02 DIAGNOSIS — Z96642 Presence of left artificial hip joint: Secondary | ICD-10-CM | POA: Diagnosis present

## 2020-08-02 DIAGNOSIS — E876 Hypokalemia: Secondary | ICD-10-CM | POA: Diagnosis not present

## 2020-08-02 DIAGNOSIS — Z8249 Family history of ischemic heart disease and other diseases of the circulatory system: Secondary | ICD-10-CM | POA: Diagnosis not present

## 2020-08-02 DIAGNOSIS — I083 Combined rheumatic disorders of mitral, aortic and tricuspid valves: Secondary | ICD-10-CM | POA: Diagnosis present

## 2020-08-02 DIAGNOSIS — I739 Peripheral vascular disease, unspecified: Secondary | ICD-10-CM | POA: Diagnosis not present

## 2020-08-02 DIAGNOSIS — Z7901 Long term (current) use of anticoagulants: Secondary | ICD-10-CM

## 2020-08-02 DIAGNOSIS — Z9889 Other specified postprocedural states: Secondary | ICD-10-CM | POA: Diagnosis not present

## 2020-08-02 DIAGNOSIS — I5043 Acute on chronic combined systolic (congestive) and diastolic (congestive) heart failure: Secondary | ICD-10-CM | POA: Diagnosis not present

## 2020-08-02 DIAGNOSIS — R0602 Shortness of breath: Secondary | ICD-10-CM | POA: Diagnosis present

## 2020-08-02 DIAGNOSIS — I5023 Acute on chronic systolic (congestive) heart failure: Secondary | ICD-10-CM | POA: Diagnosis not present

## 2020-08-02 DIAGNOSIS — Z7189 Other specified counseling: Secondary | ICD-10-CM | POA: Diagnosis not present

## 2020-08-02 DIAGNOSIS — Z950 Presence of cardiac pacemaker: Secondary | ICD-10-CM

## 2020-08-02 DIAGNOSIS — M1990 Unspecified osteoarthritis, unspecified site: Secondary | ICD-10-CM | POA: Diagnosis present

## 2020-08-02 DIAGNOSIS — Z888 Allergy status to other drugs, medicaments and biological substances status: Secondary | ICD-10-CM | POA: Diagnosis not present

## 2020-08-02 DIAGNOSIS — I472 Ventricular tachycardia: Secondary | ICD-10-CM | POA: Diagnosis not present

## 2020-08-02 DIAGNOSIS — Z6820 Body mass index (BMI) 20.0-20.9, adult: Secondary | ICD-10-CM

## 2020-08-02 DIAGNOSIS — I480 Paroxysmal atrial fibrillation: Secondary | ICD-10-CM | POA: Diagnosis not present

## 2020-08-02 DIAGNOSIS — Z79899 Other long term (current) drug therapy: Secondary | ICD-10-CM | POA: Diagnosis not present

## 2020-08-02 DIAGNOSIS — R042 Hemoptysis: Secondary | ICD-10-CM | POA: Diagnosis not present

## 2020-08-02 DIAGNOSIS — J9601 Acute respiratory failure with hypoxia: Secondary | ICD-10-CM | POA: Diagnosis not present

## 2020-08-02 DIAGNOSIS — M25551 Pain in right hip: Secondary | ICD-10-CM

## 2020-08-02 DIAGNOSIS — Z801 Family history of malignant neoplasm of trachea, bronchus and lung: Secondary | ICD-10-CM

## 2020-08-02 DIAGNOSIS — Z4659 Encounter for fitting and adjustment of other gastrointestinal appliance and device: Secondary | ICD-10-CM

## 2020-08-02 DIAGNOSIS — I110 Hypertensive heart disease with heart failure: Secondary | ICD-10-CM | POA: Diagnosis not present

## 2020-08-02 DIAGNOSIS — I4821 Permanent atrial fibrillation: Secondary | ICD-10-CM | POA: Diagnosis not present

## 2020-08-02 DIAGNOSIS — N179 Acute kidney failure, unspecified: Secondary | ICD-10-CM | POA: Diagnosis not present

## 2020-08-02 DIAGNOSIS — Z66 Do not resuscitate: Secondary | ICD-10-CM | POA: Diagnosis not present

## 2020-08-02 DIAGNOSIS — I130 Hypertensive heart and chronic kidney disease with heart failure and stage 1 through stage 4 chronic kidney disease, or unspecified chronic kidney disease: Principal | ICD-10-CM | POA: Diagnosis present

## 2020-08-02 DIAGNOSIS — I5022 Chronic systolic (congestive) heart failure: Secondary | ICD-10-CM | POA: Diagnosis not present

## 2020-08-02 DIAGNOSIS — Z20822 Contact with and (suspected) exposure to covid-19: Secondary | ICD-10-CM | POA: Diagnosis not present

## 2020-08-02 DIAGNOSIS — I428 Other cardiomyopathies: Secondary | ICD-10-CM | POA: Diagnosis not present

## 2020-08-02 DIAGNOSIS — Y69 Unspecified misadventure during surgical and medical care: Secondary | ICD-10-CM | POA: Diagnosis not present

## 2020-08-02 DIAGNOSIS — R54 Age-related physical debility: Secondary | ICD-10-CM | POA: Diagnosis present

## 2020-08-02 DIAGNOSIS — I493 Ventricular premature depolarization: Secondary | ICD-10-CM | POA: Diagnosis not present

## 2020-08-02 DIAGNOSIS — R0489 Hemorrhage from other sites in respiratory passages: Secondary | ICD-10-CM | POA: Diagnosis not present

## 2020-08-02 DIAGNOSIS — R64 Cachexia: Secondary | ICD-10-CM | POA: Diagnosis present

## 2020-08-02 DIAGNOSIS — R58 Hemorrhage, not elsewhere classified: Secondary | ICD-10-CM

## 2020-08-02 DIAGNOSIS — J90 Pleural effusion, not elsewhere classified: Secondary | ICD-10-CM | POA: Diagnosis not present

## 2020-08-02 DIAGNOSIS — E785 Hyperlipidemia, unspecified: Secondary | ICD-10-CM | POA: Diagnosis present

## 2020-08-02 DIAGNOSIS — I281 Aneurysm of pulmonary artery: Secondary | ICD-10-CM | POA: Diagnosis not present

## 2020-08-02 DIAGNOSIS — N1831 Chronic kidney disease, stage 3a: Secondary | ICD-10-CM | POA: Diagnosis present

## 2020-08-02 DIAGNOSIS — S25401A Unspecified injury of right pulmonary blood vessels, initial encounter: Secondary | ICD-10-CM | POA: Diagnosis not present

## 2020-08-02 DIAGNOSIS — Z515 Encounter for palliative care: Secondary | ICD-10-CM

## 2020-08-02 DIAGNOSIS — J984 Other disorders of lung: Secondary | ICD-10-CM | POA: Diagnosis not present

## 2020-08-02 DIAGNOSIS — I509 Heart failure, unspecified: Secondary | ICD-10-CM | POA: Diagnosis not present

## 2020-08-02 LAB — ECHOCARDIOGRAM COMPLETE
Calc EF: 25.4 %
Height: 65 in
MV M vel: 4.46 m/s
MV Peak grad: 79.6 mmHg
P 1/2 time: 350 msec
Radius: 0.5 cm
S' Lateral: 5 cm
Single Plane A2C EF: 32.9 %
Single Plane A4C EF: 18.1 %
Weight: 2009.6 oz

## 2020-08-02 LAB — BASIC METABOLIC PANEL
Anion gap: 12 (ref 5–15)
BUN: 38 mg/dL — ABNORMAL HIGH (ref 8–23)
CO2: 26 mmol/L (ref 22–32)
Calcium: 9.1 mg/dL (ref 8.9–10.3)
Chloride: 100 mmol/L (ref 98–111)
Creatinine, Ser: 1.52 mg/dL — ABNORMAL HIGH (ref 0.44–1.00)
GFR, Estimated: 34 mL/min — ABNORMAL LOW (ref >60)
Glucose, Bld: 121 mg/dL — ABNORMAL HIGH (ref 70–99)
Potassium: 3.4 mmol/L — ABNORMAL LOW (ref 3.5–5.1)
Sodium: 138 mmol/L (ref 135–145)

## 2020-08-02 LAB — CBC
HCT: 45.4 % (ref 36.0–46.0)
Hemoglobin: 15.1 g/dL — ABNORMAL HIGH (ref 12.0–15.0)
MCH: 31 pg (ref 26.0–34.0)
MCHC: 33.3 g/dL (ref 30.0–36.0)
MCV: 93.2 fL (ref 80.0–100.0)
Platelets: 151 10*3/uL (ref 150–400)
RBC: 4.87 MIL/uL (ref 3.87–5.11)
RDW: 13.1 % (ref 11.5–15.5)
WBC: 6.5 10*3/uL (ref 4.0–10.5)
nRBC: 0 % (ref 0.0–0.2)

## 2020-08-02 LAB — BRAIN NATRIURETIC PEPTIDE: B Natriuretic Peptide: 1285 pg/mL — ABNORMAL HIGH (ref 0.0–100.0)

## 2020-08-02 LAB — RESPIRATORY PANEL BY RT PCR (FLU A&B, COVID)
Influenza A by PCR: NEGATIVE
Influenza B by PCR: NEGATIVE
SARS Coronavirus 2 by RT PCR: NEGATIVE

## 2020-08-02 LAB — LACTIC ACID, PLASMA
Lactic Acid, Venous: 1.8 mmol/L (ref 0.5–1.9)
Lactic Acid, Venous: 2.1 mmol/L (ref 0.5–1.9)

## 2020-08-02 MED ORDER — RIVAROXABAN 15 MG PO TABS
15.0000 mg | ORAL_TABLET | Freq: Every day | ORAL | Status: DC
  Administered 2020-08-02: 15 mg via ORAL
  Filled 2020-08-02: qty 1

## 2020-08-02 MED ORDER — FUROSEMIDE 10 MG/ML IJ SOLN
40.0000 mg | Freq: Two times a day (BID) | INTRAMUSCULAR | Status: DC
  Administered 2020-08-02 (×2): 40 mg via INTRAVENOUS
  Filled 2020-08-02 (×2): qty 4

## 2020-08-02 MED ORDER — SODIUM CHLORIDE 0.9% FLUSH: 3.0000 mL | INTRAVENOUS | Status: DC | PRN

## 2020-08-02 MED ORDER — ACETAMINOPHEN 325 MG PO TABS
650.0000 mg | ORAL_TABLET | ORAL | Status: DC | PRN
  Administered 2020-08-02: 650 mg via ORAL
  Filled 2020-08-02: qty 2

## 2020-08-02 MED ORDER — POTASSIUM CHLORIDE CRYS ER 20 MEQ PO TBCR
40.0000 meq | EXTENDED_RELEASE_TABLET | Freq: Once | ORAL | Status: AC
  Administered 2020-08-02: 40 meq via ORAL
  Filled 2020-08-02: qty 2

## 2020-08-02 MED ORDER — AMIODARONE HCL 200 MG PO TABS
200.0000 mg | ORAL_TABLET | Freq: Two times a day (BID) | ORAL | Status: DC
  Administered 2020-08-02 – 2020-08-03 (×3): 200 mg via ORAL
  Filled 2020-08-02 (×3): qty 1

## 2020-08-02 MED ORDER — EMPAGLIFLOZIN 10 MG PO TABS
10.0000 mg | ORAL_TABLET | Freq: Every day | ORAL | Status: DC
  Administered 2020-08-03: 10 mg via ORAL
  Filled 2020-08-02 (×2): qty 1

## 2020-08-02 MED ORDER — MIDODRINE HCL 5 MG PO TABS
2.5000 mg | ORAL_TABLET | Freq: Three times a day (TID) | ORAL | Status: DC
  Administered 2020-08-02 (×2): 2.5 mg via ORAL
  Filled 2020-08-02 (×2): qty 1

## 2020-08-02 MED ORDER — ZOLPIDEM TARTRATE 5 MG PO TABS
5.0000 mg | ORAL_TABLET | Freq: Every day | ORAL | Status: DC
  Administered 2020-08-02 – 2020-08-03 (×2): 5 mg via ORAL
  Filled 2020-08-02 (×2): qty 1

## 2020-08-02 MED ORDER — SODIUM CHLORIDE 0.9% FLUSH
3.0000 mL | Freq: Two times a day (BID) | INTRAVENOUS | Status: DC
  Administered 2020-08-02 – 2020-08-03 (×3): 3 mL via INTRAVENOUS

## 2020-08-02 MED ORDER — SODIUM CHLORIDE 0.9 % IV SOLN: 250.0000 mL | INTRAVENOUS | Status: DC | PRN

## 2020-08-02 NOTE — ED Triage Notes (Signed)
Pt reports ongoing SOB and lower abd pain. Reports dr Marigene Ehlers recently took her off of lasix due to declining GFR, reports SOB has increased since then. Endorses worsening sob with exertion, denies edema.

## 2020-08-02 NOTE — ED Provider Notes (Signed)
I saw and evaluated the patient, reviewed the resident's note and I agree with the findings and plan.  EKG: EKG Interpretation  Date/Time:  Monday August 02 2020 08:10:57 EST Ventricular Rate:  83 PR Interval:    QRS Duration: 158 QT Interval:  478 QTC Calculation: 561 R Axis:   172 Text Interpretation: Ventricular-paced rhythm with occasional Premature ventricular complexes Abnormal ECG No significant change since last tracing Confirmed by Lacretia Leigh [id number]) on 08/02/2020 8:74:58 AM 85 year old female presents with shortness of breath described as dyspnea on exertion.  Has a history of CHF and recently had her dose decreased by a week ago.  Chest x-ray here consistent with CHF and possible pneumonia.  Will consult heart failure team and likely admit   Lacretia Leigh, MD 08/02/20 [phone]

## 2020-08-02 NOTE — Telephone Encounter (Signed)
   The patient called the answering service after-hours today. Has been experiencing "hyperventilation" and severe worsening of SOB / weakness over the last 3 days. Chart reviewed -> recent course complicated by worsening of renal function by labs 5 days ago, requiring decrease in diuretic. She feels like she needs to go to the emergency room because she cant even walk a few feet without giving out. I told her I felt that if symptoms were that severe, proceeding to the ER is the best measure to help determine plan for symptomatic relief. Her son will plan to bring her. The patient verbalized understanding and gratitude.  Charlie Pitter, PA-C

## 2020-08-02 NOTE — H&P (Addendum)
Advanced Heart Failure Team History and Physical Note   PCP:  Lavone Orn, MD  PCP-Cardiology: [name], MD     Reason for Admission: A/C Systolic Heart Failure    HPI:   Ms Krage is an 85 year old with a history of HTN, HLD, PAF (On Xarelto), and nonischemic cardiomyopathy with chronic systolic CHF. Cardiomyopathy dates back to at least 2014 . Most recent ECHO 09/2019 showed EF < 20%.    Intolerant ranolazine, losartan,spironolactone, amiodarone, sotolol, and tikosyn.  Followed closely in the HF clinic and was last seen 07/06/20. She had been having ongoing episodes of presyncope. At that time she was started on Jardiance 10 mg daily and digoxin was stopped due to elevated level. Medtronic Interrogation showed 80%  BiV pacing and A fib. Weight at that time was 125 pounds.   On 07/30/20 she was called and instructed to hold lasix a day then cut back to 20 mg daily. Creatinine had gone up from 1.1>1.7.   She has been having ongoing presyncope that is associated with increased shortness of breath. Says she has been having theses "spells" everyday.  Last week she had an episode of presyncope and she started panting. Says she took ativan and the symptoms resolved. The episodes only happen when she is walking. She has been taking ativan 0.5 mg daily. Complaining of extreme leg weakness. Weight at home trending down from 125-->120 pounds.   Presented to [facility] with increased shortness of breath. CXR with trace effusions R/L and possible heart failure.  BP in the ED soft. Pertinent labs included: BNP 1285, K 3.4, Creatinine 1.5, and Hgb 15.1.   Blood cultures obtained. Lactic acid 1.8.    -Echo (2014): EF 30-35%. - Echo (2/17): EF 40-45% - Echo (8/17): EF 40-45%, mid to apical inferoseptal akinesis - Coronary angiography (8/17): No significant CAD.  - Cardiac MRI (10/17): EF 36%, moderate MR, normal RV size and systolic function, mid-wall LGE in the basal to mid septum and  inferior wall.  - TEE (10/18): EF 20-25% but in rapid afib with HR up to 150 bpm, mild MR.  - Echo (10/18): EF 40-45%, diffuse hypokinesis, moderate LAE, reportedly moderate-severe MR (looks moderate on my review).  - Echo (12/18): EF 25-30%, moderate MR, moderate TR, severe LAE - Medtronic CRT-D device s/p AV nodal ablation.  - Echo (4/19): EF 25-30%, diffuse hypokinesis, mild MR, moderately decreased RV systolic function.  - Echo (8/20): EF 25-30%, normal RV size and systolic function, mild MR, moderate-severe TR.  - RHC (1/21): mean RA 9, PA 39/19, mean PCWP 22, CI 1.72, PVR 2.4 WU - Echo (1/21): EF <20%, moderate LV dilation, mild RV    Review of Systems: [y] = yes, [ ]  = no   General: Weight gain [ ] ; Weight loss [ Y]; Anorexia [ ] ; Fatigue [Y ]; Fever [ ] ; Chills [ ] ; Weakness [ ]   Cardiac: Chest pain/pressure [ ] ; Resting SOB [ ] ; Exertional SOB [Y ]; Orthopnea [ ] ; Pedal Edema [ ] ; Palpitations [ ] ; Syncope [ ] ; Presyncope [ ] ; Paroxysmal nocturnal dyspnea[ ]   Pulmonary: Cough [ ] ; Wheezing[ ] ; Hemoptysis[ ] ; Sputum [ ] ; Snoring [ ]   GI: Vomiting[ ] ; Dysphagia[ ] ; Melena[ ] ; Hematochezia [ ] ; Heartburn[ ] ; Abdominal pain [ ] ; Constipation [ ] ; Diarrhea [ ] ; BRBPR [ ]   GU: Hematuria[ ] ; Dysuria [ ] ; Nocturia[ ]   Vascular: Pain in legs with walking [ ] ; Pain in feet with lying flat [ ] ;  Non-healing sores [ ] ; Stroke [ ] ; TIA [ ] ; Slurred speech [ ] ;  Neuro: Headaches[ ] ; Vertigo[ ] ; Seizures[ ] ; Paresthesias[ ] ;Blurred vision [ ] ; Diplopia [ ] ; Vision changes [ ]   Ortho/Skin: Arthritis [ ] ; Joint pain [ ] ; Muscle pain [ ] ; Joint swelling [ ] ; Back Pain [ ] ; Rash [ ]   Psych: Depression[ ] ; Anxiety[ ]   Heme: Bleeding problems [ ] ; Clotting disorders [ ] ; Anemia [ ]   Endocrine: Diabetes [ ] ; Thyroid dysfunction[ ]    Home Medications Prior to Admission medications   Medication Sig Start Date End Date Taking? Authorizing Provider  acetaminophen (TYLENOL) 500 MG tablet Take 500 mg by  mouth every 6 (six) hours as needed for headache.   Yes [provider]  bisoprolol (ZEBETA) 5 MG tablet Take 2.5 mg by mouth daily.    Yes [provider]  calcium carbonate (TUMS - DOSED IN MG ELEMENTAL CALCIUM) 500 MG chewable tablet Chew 1 tablet by mouth daily as needed for indigestion or heartburn.   Yes [provider]  empagliflozin (JARDIANCE) 10 MG TABS tablet Take 1 tablet (10 mg total) by mouth daily before breakfast. 07/06/20  Yes Larey Dresser, MD  fluticasone [facility]) 50 MCG/ACT nasal spray Place 1 spray into both nostrils daily as needed for allergies or rhinitis.    Yes [provider]  furosemide (LASIX) 20 MG tablet Take 2 tablets (40 mg total) by mouth every morning AND 1 tablet (20 mg total) every evening. Patient taking differently: 1 tablet 20mg  orally daily 07/21/20  Yes Larey Dresser, MD  LORazepam (ATIVAN) 0.5 MG tablet Take 1 tablet (0.5 mg total) by mouth daily as needed (nausea). 02/06/20  Yes Bonnielee Haff, MD  potassium chloride SA (KLOR-CON M20) 20 MEQ tablet Take 1 tablet (20 mEq total) by mouth daily. 05/14/20 08/12/20 Yes Larey Dresser, MD  Rivaroxaban (XARELTO) 15 MG TABS tablet Take 1 tablet (15 mg total) by mouth daily with supper. 12/11/19  Yes Larey Dresser, MD  sennosides-docusate sodium (SENOKOT-S) 8.6-50 MG tablet Take 1 tablet by mouth as needed for constipation.   Yes [provider]  zolpidem (AMBIEN) 5 MG tablet Take 1 tablet (5 mg total) by mouth at bedtime. Patient taking differently: Take 7.5 mg by mouth at bedtime.  02/06/20  Yes Bonnielee Haff, MD    Past Medical History: Past Medical History:  Diagnosis Date  . Arthritis   . Chronic combined systolic (congestive) and diastolic (congestive) heart failure ([place])   . Mitral regurgitation   . Non-ischemic cardiomyopathy ([place])   . Persistent atrial fibrillation ([place])   . Ventricular tachycardia [facility])     Past Surgical History: Past Surgical  History:  Procedure Laterality Date  . AV NODE ABLATION N/A 09/07/2017   Procedure: AV NODE ABLATION;  Surgeon: [name], MD;  Location: [place] CV LAB;  Service: Cardiovascular;  Laterality: N/A;  . BIV ICD INSERTION CRT-D N/A 09/07/2017   Procedure: BIV ICD INSERTION CRT-D;  Surgeon: [name], MD;  Location: [place] CV LAB;  Service: Cardiovascular;  Laterality: N/A;  . BUNIONECTOMY    . CARDIAC CATHETERIZATION     in 2004 at [facility]. "Insignificant blockage" per patient  . CARDIAC CATHETERIZATION N/A 04/25/2016   Procedure: Left Heart Cath and Coronary Angiography;  Surgeon: Nelva Bush, MD;  Location: [place] CV LAB;  Service: Cardiovascular;  Laterality: N/A;  . CARDIOVERSION N/A 06/29/2017   Procedure: CARDIOVERSION;  Surgeon: Larey Dresser, MD;  Location:  [place] ENDOSCOPY;  Service: Cardiovascular;  Laterality: N/A;  . CARDIOVERSION N/A 08/28/2017   Procedure: CARDIOVERSION;  Surgeon: Larey Dresser, MD;  Location: [facility] ENDOSCOPY;  Service: Cardiovascular;  Laterality: N/A;  . RIGHT HEART CATH N/A 09/29/2019   Procedure: RIGHT HEART CATH;  Surgeon: Larey Dresser, MD;  Location: [place] CV LAB;  Service: Cardiovascular;  Laterality: N/A;  . SHOULDER SURGERY     closed reduction  . TEE WITHOUT CARDIOVERSION N/A 06/29/2017   Procedure: TRANSESOPHAGEAL ECHOCARDIOGRAM (TEE);  Surgeon: Larey Dresser, MD;  Location: [facility] ENDOSCOPY;  Service: Cardiovascular;  Laterality: N/A;  . TONSILLECTOMY    . TOTAL HIP ARTHROPLASTY Left 03/18/2013   Procedure: TOTAL HIP ARTHROPLASTY ANTERIOR APPROACH;  Surgeon: Mauri Pole, MD;  Location: [facility] ORS;  Service: Orthopedics;  Laterality: Left;  . TUBAL LIGATION      Family History:  Family History  Problem Relation Age of Onset  . Lung cancer Mother   . Heart attack Father     Social History: Social History   Socioeconomic History  . Marital status: Widowed    Spouse name: Not on file  . Number of children: Not  on file  . Years of education: Not on file  . Highest education level: Not on file  Occupational History  . Not on file  Tobacco Use  . Smoking status: Never Smoker  . Smokeless tobacco: Never Used  Vaping Use  . Vaping Use: Never used  Substance and Sexual Activity  . Alcohol use: Yes    Alcohol/week: 3.0 standard drinks    Types: 3 Glasses of wine per week    Comment: per week  . Drug use: No  . Sexual activity: Not on file  Other Topics Concern  . Not on file  Social History Narrative  . Not on file   Social Determinants of Health   Financial Resource Strain:   . Difficulty of Paying Living Expenses: Not on file  Food Insecurity:   . Worried About Charity fundraiser in the Last Year: Not on file  . Ran Out of Food in the Last Year: Not on file  Transportation Needs:   . Lack of Transportation (Medical): Not on file  . Lack of Transportation (Non-Medical): Not on file  Physical Activity:   . Days of Exercise per Week: Not on file  . Minutes of Exercise per Session: Not on file  Stress:   . Feeling of Stress : Not on file  Social Connections:   . Frequency of Communication with Friends and Family: Not on file  . Frequency of Social Gatherings with Friends and Family: Not on file  . Attends Religious Services: Not on file  . Active Member of Clubs or Organizations: Not on file  . Attends Archivist Meetings: Not on file  . Marital Status: Not on file    Allergies:  Allergies  Allergen Reactions  . Sotalol Other (See Comments)    Prolonged QTc  . Alendronate Sodium Other (See Comments)    Arm pain  . Ciprofloxacin Other (See Comments)    Not effective  . Compazine [Prochlorperazine]   . Coreg [Carvedilol] Other (See Comments)    Fatigue   . Metoprolol Other (See Comments)    Profound fatigue    Objective:    Vital Signs:   Temp:  [97.5 F (36.4 C)] 97.5 F (36.4 C) (11/22 0815) Pulse Rate:  [75-84] 84 (11/22 1200) Resp:  [6-24] 18 (11/22  1200)  BP: (97-108)/(63-86) 108/83 (11/22 1200) SpO2:  [95 %-100 %] 97 % (11/22 1200) Weight:  [57 kg] 57 kg (11/22 0927)   Filed Weights   08/02/20 0927  Weight: 57 kg     Physical Exam     General:   No respiratory difficulty HEENT: Normal Neck: Supple. JVP 10-11  Carotids 2+ bilat; no bruits. No lymphadenopathy or thyromegaly appreciated. Cor: PMI nondisplaced. Irregular rate & rhythm. No rubs, gallops or murmurs. Lungs: Clear Abdomen: Soft, nontender, nondistended. No hepatosplenomegaly. No bruits or masses. Good bowel sounds. Extremities: No cyanosis, clubbing, rash, edema Neuro: Alert & oriented x 3, cranial nerves grossly intact. moves all 4 extremities w/o difficulty. Affect pleasant.   Telemetry  A fib 80s with frequent PVCs.   EKG   V paced QRS 158 ms.   Labs     Basic Metabolic Panel: Recent Labs  Lab 07/28/20 0949 08/02/20 0832  NA 137 138  K 3.3* 3.4*  CL 99 100  CO2 27 26  GLUCOSE 135* 121*  BUN 31* 38*  CREATININE 1.78* 1.52*  CALCIUM 9.0 9.1    Liver Function Tests: No results for input(s): AST, ALT, ALKPHOS, BILITOT, PROT, ALBUMIN in the last 168 hours. No results for input(s): LIPASE, AMYLASE in the last 168 hours. No results for input(s): AMMONIA in the last 168 hours.  CBC: Recent Labs  Lab 08/02/20 0832  WBC 6.5  HGB 15.1*  HCT 45.4  MCV 93.2  PLT 151    Cardiac Enzymes: No results for input(s): CKTOTAL, CKMB, CKMBINDEX, TROPONINI in the last 168 hours.  BNP: BNP (last 3 results) Recent Labs    09/27/19 1708 08/02/20 0832  BNP 1,032.7* 1,285.0*    ProBNP (last 3 results) No results for input(s): PROBNP in the last 8760 hours.   CBG: No results for input(s): GLUCAP in the last 168 hours.  Coagulation Studies: No results for input(s): LABPROT, INR in the last 72 hours.  Imaging: DG Chest 2 View  Result Date: 08/02/2020 CLINICAL DATA:  Shortness of breath, ongoing shortness of breath and lower abdominal pain  EXAM: CHEST - 2 VIEW COMPARISON:  02/01/2020 FINDINGS: LEFT-sided dual lead pacer defibrillator with similar position. Power pack over LEFT chest. Cardiomediastinal contours stable accounting for technical factors such as projection and development of some basilar airspace disease since the previous study. Graded opacity on frontal view with obscured LEFT and RIGHT hemidiaphragms. Potential trace pleural effusion. Flattening of LEFT and RIGHT hemidiaphragm. Vascular congestion with mild increased interstitial prominence. On limited assessment no acute skeletal process. IMPRESSION: New basilar airspace disease. Potential trace effusions on the LEFT and RIGHT. Findings may represent sequela of heart failure with basilar volume loss. Difficult to exclude infection at the lung bases. Electronically Signed   By: Zetta Bills M.D.   On: 08/02/2020 08:49       Assessment/Plan   1. A/C Systolic Heart Failure.  NICM, LHC 04/2016 showed no significant coronary disease. Cardiac MRI 10/17 EF 36%, diffuse HK, normal RV, biatrial enlargement, moderate MR, non-coronary LGE pattern involving the mid-wall of the basal to mid septum and inferior wall. Possibly prior myocarditis versus a form of infiltrative disease. Medtronic CRT-D s/p AV nodal ablation.  Echo in 1/21 showed EF < 20% with moderate LV dilation and mildly decreased RV systolic function.  Low output on 1/21 RHC (CI 1.72). Repeat ECHO. Has Medtronic CRT. QRS wide ? If EP can optimize.  Hold bb with soft BP and will try amio again.  -  BNP/CXR concerning for HF exacerbation. On exam volume overloaded. Start lasix 40 mg IV bid. Give 40 meq K dur twice a day  - Intolerant  Losartan and spiro  - Follow renal function closely.  - She is not a candidate for LVAD with age/frailty. - If renal function worsens will consider RHC versus PICC.  - Will need Palliative Care for goals of care.    2. Presyncope ? This has been going on for some time. Interrogate  device.  Add midodrine 2.5 mg three times a day   3. PVCs  In the past she was intolerant amio due to fatigue. She would like to try amiodarone gain given  limited options.  - Start amio 200 mg twice a day.   4. A fib, chronic   S/P AV nodal Ablation - Continue xarelto.   5. CKD Stage IIIa Creatinine on admit 1.5. Follow daily.   Consult EP. ? Device optimization.  Admit to progressive care.     Darrick Grinder, NP 08/02/2020, 12:34 PM  Advanced Heart Failure Team Pager [phone] (M-F; 7a - 4p)  Please contact [facility] for night-coverage after hours (4p -7a ) and weekends on [email or web address]  Patient seen with NP, agree with the above note.   Patient with nonischemic cardiomyopathy and permanent atrial fibrillation with AV nodal ablation and MDT CRT-D.  She has struggled over the last year with low output HF, IC 1.7 on RHC in 1/21.  Not thought to be a good candidate for advanced therapies due to age and stature (small/thin).    Lasix was recently decreased from 40 mg daily to 20 mg daily due to elevation in creatinine. Since then, she has gained weight and developed worsening dyspnea.  She will have dyspnea/fatigue and sometimes presyncope walking short distances.  SBP in 90s-100s in the ER.  CXR with evidence for pulmonary edema. Creatinine 1.5 today. Patient notes that HR gets down around ?40 when she develops presyncope (but has CRT device). Also noted to have relatively wide QRS despite pacing.  She had low BiV pacing percentage at last appointment (around 80%) thought to be due to frequent PVCs.  She has frequent PVCs on telemetry today.   General: NAD, thin/frail.  Neck: JVP 14-16 cm, no thyromegaly or thyroid nodule.  Lungs: Clear to auscultation bilaterally with normal respiratory effort. CV: Lateral PMI.  Heart regular S1/S2, no S3/S4, 2/6 HSM apex.  No peripheral edema.  No carotid bruit.  Normal pedal pulses.  Abdomen: Soft, nontender, no hepatosplenomegaly, no distention.    Skin: Intact without lesions or rashes.  Neurologic: Alert and oriented x 3.  Psych: Normal affect. Extremities: No clubbing or cyanosis.  HEENT: Normal.   1. Acute on chronic systolic HF: NICM, LHC 10/5954 showed no significant coronary disease. Cardiac MRI 10/17 EF 36%, diffuse HK, normal RV, biatrial enlargement, moderate MR, non-coronary LGE pattern involving the mid-wall of the basal to mid septum and inferior wall. Possibly prior myocarditis versus a form of infiltrative disease. Medtronic CRT-D s/p AV nodal ablation.  Echo in 1/21 showed EF < 20% with moderate LV dilation and mildly decreased RV systolic function.  Low output on 1/21 RHC (CI 1.72).  At last appointment, she was not BiV pacing at an ideal percentage, likely due to PVCs.  Symptomatically worse, NYHA class IIIb since decreasing Lasix in setting of rising creatinine. Suspect low output HF.  - Will use Lasix 40 mg IV bid.  - She is off losartan and spironolactone with symptomatic  hypotension, will leave off for now.  Will start midodrine 2.5 mg tid given presyncopal spells.  - Can continue Jardiance 10 mg daily.   -Hold bisoprolol as plan to retry amiodarone as below.  - Off digoxin with persistently elevated level despite cutting back to 0.0625 mg qod.  - I will ask EP to see her => paced QRS is relatively wide, I wonder if there is any form of optimization that can be done to narrow the QRS.  - Concerned that frequent PVCs may be affecting function (and also limiting BiV pacing percentage) => I will retry her on amiodarone 200 mg bid.  - Low output on 1/21 RHC.  Given her age and frailty, think that LVAD would not be a good option.  - She does not qualify for [facility] given age, but I would like her eventually evaluated for participation in ANTHEM trial as vagal stimulation may improve her symptoms.  2. Atrial fibrillation: Chronic, s/p AV nodal ablation and BiV pacing.  - Continue Xarelto 15 mg daily.  3. PVCs/VT: Frequent  PVCs have limited BiV pacing.  Not a good PVC ablation candidate per EP. Had VT in 1/21.  Cannot tolerate amiodarone, sotolol, or Tikosyn. Did not tolerate mexilitine with nausea. Had nausea also with ranolazine. Additionally, did not tolerate further uptitration of bisoprolol.  - We will try again to suppress PVCs with amiodarone, start at 200 mg bid.  Will hold bisoprolol since starting amiodarone.  4. Mitral regurgitation: Mild-moderate on 1/21 echo.  - Repeat echo to assess for worsening MR.   [name] 08/02/2020 3:07 PM

## 2020-08-02 NOTE — ED Provider Notes (Signed)
[name]   CSN: [id number] Arrival date & time: 08/02/20  0805     History Chief Complaint  Patient presents with  . 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  Patient states she has had shortness of breath for the past 6 months with her Lasix was reduced to once a day by her cardiologist (Dr. Aundra Dubin) last week.  Over the last week, patient has had increased shortness of breath with exertion and fatigue. States she gets tired just walking out of her car to her house.  She had to use 4 pillows instead of her usual 2 pillows last night. Patient endorses some diaphoresis, dizziness and palpitation but denies any chest pain, PND, fever/chills, headache, abdominal pain, lower extremity swelling, leg pain or dysuria.  Per chart review patient's Lasix was decreased from 40 mg daily to 20 mg daily (due to drop in GFR from 48 to 28) on 11/19 with plan to get a bmet in 1 week by cardiologist.   HPI    Past Medical History:  Diagnosis Date  . Arthritis   . Chronic combined systolic (congestive) and diastolic (congestive) heart failure ([place])   . Mitral regurgitation   . Non-ischemic cardiomyopathy ([place])   . Persistent atrial fibrillation ([place])   . Ventricular tachycardia [facility])    Patient Active Problem List   Diagnosis Date Noted  . Malnutrition of moderate degree 02/03/2020  . DNR (do not resuscitate) discussion   . Palliative care by specialist   . UTI (urinary tract infection) 02/01/2020  . Prolonged QT interval 02/01/2020  . Chronic anticoagulation 02/01/2020  . Transient hypotension 02/01/2020  . History of shingles 02/01/2020  . Near syncope 05/05/2019  . Hypercholesterolemia without hypertriglyceridemia 02/13/2018  .  Syncope 01/21/2018  . Constipation 01/21/2018  . CHF exacerbation ([place]) 12/18/2017  . CKD (chronic kidney disease), stage III ([place]) 12/16/2017  . SOB (shortness of breath)   . Abdominal pain 09/18/2017  . CHF (congestive heart failure) ([place]) 09/17/2017  . Pulmonary edema   . Cardiac device in situ   . Ventricular tachycardia ([place])   . Cardiac arrest ([place])   . Severe mitral valve regurgitation   . A-fib ([place]) 09/03/2017  . Mitral regurgitation   . Acute on chronic systolic heart failure ([place])   . Non-ischemic cardiomyopathy ([place])   . CHF (congestive heart failure), NYHA class IV ([place]) 04/24/2016  . Dizziness 12/01/2015  . Hypertension 12/01/2015  . Insomnia 12/01/2015  . Chronic combined systolic and diastolic CHF (congestive heart failure) ([place]) 12/01/2015  . Paroxysmal atrial fibrillation ([facility])   . Cardiac arrhythmia 10/20/2014  . Systolic dysfunction 50/53/9767  . Hypoventilation, idiopathic 10/20/2014  . Weakness 10/20/2014  . Closed left hip fracture ([place]) 03/17/2013  . Cardiomegaly 03/17/2013  . Frequent PVCs 03/17/2013  . Hyperlipidemia 03/17/2013   Past Surgical History:  Procedure Laterality Date  . AV NODE ABLATION N/A 09/07/2017   Procedure: AV NODE ABLATION;  Surgeon: [name], MD;  Location: [facility] CV LAB;  Service: Cardiovascular;  Laterality: N/A;  . BIV ICD INSERTION CRT-D N/A 09/07/2017   Procedure: BIV ICD INSERTION CRT-D;  Surgeon: [name], MD;  Location: [facility] CV LAB;  Service: Cardiovascular;  Laterality: N/A;  . BUNIONECTOMY    . CARDIAC  CATHETERIZATION     in 2004 at [facility]. "Insignificant blockage" per patient  . CARDIAC CATHETERIZATION N/A 04/25/2016   Procedure: Left Heart Cath and Coronary Angiography;  Surgeon: Nelva Bush, MD;  Location: [facility] CV LAB;  Service: Cardiovascular;  Laterality: N/A;  . CARDIOVERSION N/A 06/29/2017   Procedure: CARDIOVERSION;  Surgeon: Larey Dresser, MD;  Location: [facility] ENDOSCOPY;   Service: Cardiovascular;  Laterality: N/A;  . CARDIOVERSION N/A 08/28/2017   Procedure: CARDIOVERSION;  Surgeon: Larey Dresser, MD;  Location: [facility] ENDOSCOPY;  Service: Cardiovascular;  Laterality: N/A;  . RIGHT HEART CATH N/A 09/29/2019   Procedure: RIGHT HEART CATH;  Surgeon: Larey Dresser, MD;  Location: [facility] CV LAB;  Service: Cardiovascular;  Laterality: N/A;  . SHOULDER SURGERY     closed reduction  . TEE WITHOUT CARDIOVERSION N/A 06/29/2017   Procedure: TRANSESOPHAGEAL ECHOCARDIOGRAM (TEE);  Surgeon: Larey Dresser, MD;  Location: [facility] ENDOSCOPY;  Service: Cardiovascular;  Laterality: N/A;  . TONSILLECTOMY    . TOTAL HIP ARTHROPLASTY Left 03/18/2013   Procedure: TOTAL HIP ARTHROPLASTY ANTERIOR APPROACH;  Surgeon: Mauri Pole, MD;  Location: [facility] ORS;  Service: Orthopedics;  Laterality: Left;  . TUBAL LIGATION       OB History   No obstetric history on file.     Family History  Problem Relation Age of Onset  . Lung cancer Mother   . Heart attack Father     Social History   Tobacco Use  . Smoking status: Never Smoker  . Smokeless tobacco: Never Used  Vaping Use  . Vaping Use: Never used  Substance Use Topics  . Alcohol use: Yes    Alcohol/week: 3.0 standard drinks    Types: 3 Glasses of wine per week    Comment: per week  . Drug use: No    Home Medications Prior to Admission medications   Medication Sig Start Date End Date Taking? Authorizing Provider  acetaminophen (TYLENOL) 500 MG tablet Take 500 mg by mouth every 6 (six) hours as needed for headache.   Yes [provider]  bisoprolol (ZEBETA) 5 MG tablet Take 2.5 mg by mouth daily.    Yes [provider]  calcium carbonate (TUMS - DOSED IN MG ELEMENTAL CALCIUM) 500 MG chewable tablet Chew 1 tablet by mouth daily as needed for indigestion or heartburn.   Yes [provider]  empagliflozin (JARDIANCE) 10 MG TABS tablet Take 1 tablet (10 mg total) by mouth daily before breakfast.  07/06/20  Yes Larey Dresser, MD  fluticasone [facility]) 50 MCG/ACT nasal spray Place 1 spray into both nostrils daily as needed for allergies or rhinitis.    Yes [provider]  furosemide (LASIX) 20 MG tablet Take 2 tablets (40 mg total) by mouth every morning AND 1 tablet (20 mg total) every evening. Patient taking differently: 1 tablet 20mg  orally daily 07/21/20  Yes Larey Dresser, MD  LORazepam (ATIVAN) 0.5 MG tablet Take 1 tablet (0.5 mg total) by mouth daily as needed (nausea). 02/06/20  Yes Bonnielee Haff, MD  potassium chloride SA (KLOR-CON M20) 20 MEQ tablet Take 1 tablet (20 mEq total) by mouth daily. 05/14/20 08/12/20 Yes Larey Dresser, MD  Rivaroxaban (XARELTO) 15 MG TABS tablet Take 1 tablet (15 mg total) by mouth daily with supper. 12/11/19  Yes Larey Dresser, MD  sennosides-docusate sodium (SENOKOT-S) 8.6-50 MG tablet Take 1 tablet by mouth as needed for constipation.   Yes [provider]  zolpidem (AMBIEN) 5  MG tablet Take 1 tablet (5 mg total) by mouth at bedtime. Patient taking differently: Take 7.5 mg by mouth at bedtime.  02/06/20  Yes Bonnielee Haff, MD    Allergies    Sotalol, Alendronate sodium, Ciprofloxacin, Compazine [prochlorperazine], Coreg [carvedilol], and Metoprolol  Review of Systems   Review of Systems  Constitutional: Positive for diaphoresis and fatigue. Negative for chills and fever.  HENT: Negative for congestion.   Eyes: Negative for visual disturbance.  Respiratory: Positive for shortness of breath. Negative for cough, chest tightness and wheezing.   Cardiovascular: Positive for palpitations. Negative for chest pain and leg swelling.  Gastrointestinal: Negative for abdominal pain, nausea and vomiting.  Genitourinary: Negative for dysuria.  Musculoskeletal: Negative for back pain.  Skin: Negative for rash.  Neurological: Positive for dizziness. Negative for numbness and headaches.  Psychiatric/Behavioral: Negative for  agitation and confusion.    Physical Exam Updated Vital Signs BP 105/78   Pulse 81   Temp (!) 97.5 F (36.4 C) (Oral)   Resp 18   Ht 5\' 5"  (1.651 m)   Wt 57 kg   SpO2 98%   BMI 20.90 kg/m   Physical Exam Constitutional:      General: She is not in acute distress.    Appearance: She is well-developed.  HENT:     Head: Normocephalic and atraumatic.  Neck:     Vascular: No JVD.  Cardiovascular:     Rate and Rhythm: Normal rate. Rhythm irregular.     Heart sounds: Normal heart sounds. No murmur heard.   Pulmonary:     Effort: Pulmonary effort is normal. No tachypnea or respiratory distress.     Breath sounds: Examination of the right-lower field reveals rales. Examination of the left-lower field reveals rales. Rales present.  Chest:     Chest wall: No deformity.  Abdominal:     General: Bowel sounds are normal.     Palpations: Abdomen is soft.     Tenderness: There is no abdominal tenderness.  Musculoskeletal:        General: Normal range of motion.     Cervical back: Normal range of motion.     Right lower leg: No tenderness. No edema.     Left lower leg: No tenderness. No edema.  Skin:    General: Skin is warm and dry.     Capillary Refill: Capillary refill takes less than 2 seconds.     Nails: There is no clubbing.  Neurological:     General: No focal deficit present.     Mental Status: She is alert.  Psychiatric:        Mood and Affect: Mood normal.        Behavior: Behavior normal.     ED Results / Procedures / Treatments   Labs (all labs ordered are listed, but only abnormal results are displayed) Labs Reviewed  BASIC METABOLIC PANEL - Abnormal; Notable for the following components:      Result Value   Potassium 3.4 (*)    Glucose, Bld 121 (*)    BUN 38 (*)    Creatinine, Ser 1.52 (*)    GFR, Estimated 34 (*)    All other components within normal limits  CBC - Abnormal; Notable for the following components:   Hemoglobin 15.1 (*)    All other  components within normal limits  BRAIN NATRIURETIC PEPTIDE - Abnormal; Notable for the following components:   B Natriuretic Peptide 1,285.0 (*)    All other components within  normal limits  LACTIC ACID, PLASMA - Abnormal; Notable for the following components:   Lactic Acid, Venous 2.1 (*)    All other components within normal limits  RESPIRATORY PANEL BY RT PCR (FLU A&B, COVID)  CULTURE, BLOOD (ROUTINE X 2)  CULTURE, BLOOD (ROUTINE X 2)  LACTIC ACID, PLASMA    EKG EKG Interpretation  Date/Time:  Monday August 02 2020 08:10:57 EST Ventricular Rate:  83 PR Interval:    QRS Duration: 158 QT Interval:  478 QTC Calculation: 561 R Axis:   172 Text Interpretation: Ventricular-paced rhythm with occasional Premature ventricular complexes Abnormal ECG No significant change since last tracing Confirmed by Lacretia Leigh ([id number]) on 08/02/2020 8:47:41 AM   Radiology DG Chest 2 View  Result Date: 08/02/2020 CLINICAL DATA:  Shortness of breath, ongoing shortness of breath and lower abdominal pain EXAM: CHEST - 2 VIEW COMPARISON:  02/01/2020 FINDINGS: LEFT-sided dual lead pacer defibrillator with similar position. Power pack over LEFT chest. Cardiomediastinal contours stable accounting for technical factors such as projection and development of some basilar airspace disease since the previous study. Graded opacity on frontal view with obscured LEFT and RIGHT hemidiaphragms. Potential trace pleural effusion. Flattening of LEFT and RIGHT hemidiaphragm. Vascular congestion with mild increased interstitial prominence. On limited assessment no acute skeletal process. IMPRESSION: New basilar airspace disease. Potential trace effusions on the LEFT and RIGHT. Findings may represent sequela of heart failure with basilar volume loss. Difficult to exclude infection at the lung bases. Electronically Signed   By: Zetta Bills M.D.   On: 08/02/2020 08:49    Procedures Procedures (including critical care  time)  Medications Ordered in ED Medications  furosemide (LASIX) injection 40 mg (has no administration in time range)  potassium chloride SA (KLOR-CON) CR tablet 40 mEq (has no administration in time range)  midodrine (PROAMATINE) tablet 2.5 mg (has no administration in time range)    ED Course  I have reviewed the triage vital signs and the nursing notes.  Pertinent labs & imaging results that were available during my care of the patient were reviewed by me and considered in my medical decision making (see chart for details).  Clinical Course as of Aug 02 1112  Mon Aug 02, 2020  0905 DG Chest 2 View [PA]    Clinical Course User Index [PA] Lacinda Axon, MD   MDM Rules/Calculators/A&P                           85 year old patient with a history of A. fib on Xarelto and combined systolic and diastolic heart failure with ICD here with worsening dyspnea on exertion. Patient has had increased orthopnea and fatigue over the last few days. EKG showed V paced rhythm with occasional PVCs. Chest x-ray showed new basilar airspace disease and bilateral trace pleural effusion. BNP elevated to 1200. No white count or AKI. Mild bibasilar rales but no JVD or lower extremity edema on exam.  Presentation likely secondary to heart failure exacerbation but will consider pneumonia on the differential. Mildly elevated lactate. Pending blood cultures.  Consulted cardiology who plans to admit patient.  Final Clinical Impression(s) / ED Diagnoses Final diagnoses:  Acute on chronic combined systolic (congestive) and diastolic (congestive) heart failure [facility])    Rx / DC Orders ED Discharge Orders    None       Lacinda Axon, MD 08/02/20 1247    Lacretia Leigh, MD 08/03/20 [phone]

## 2020-08-02 NOTE — ED Notes (Signed)
Alert and oriented daughter at bedside.  Room cold  Warm blankets given

## 2020-08-02 NOTE — Progress Notes (Signed)
  Echocardiogram 2D Echocardiogram has been performed.  Cheryl Vargas 08/02/2020, 4:05 PM

## 2020-08-02 NOTE — ED Notes (Signed)
MD at bedside. 

## 2020-08-03 DIAGNOSIS — I5023 Acute on chronic systolic (congestive) heart failure: Secondary | ICD-10-CM | POA: Diagnosis not present

## 2020-08-03 LAB — BASIC METABOLIC PANEL
Anion gap: 14 (ref 5–15)
BUN: 42 mg/dL — ABNORMAL HIGH (ref 8–23)
CO2: 22 mmol/L (ref 22–32)
Calcium: 8.6 mg/dL — ABNORMAL LOW (ref 8.9–10.3)
Chloride: 100 mmol/L (ref 98–111)
Creatinine, Ser: 1.74 mg/dL — ABNORMAL HIGH (ref 0.44–1.00)
GFR, Estimated: 29 mL/min — ABNORMAL LOW (ref >60)
Glucose, Bld: 121 mg/dL — ABNORMAL HIGH (ref 70–99)
Potassium: 3.8 mmol/L (ref 3.5–5.1)
Sodium: 136 mmol/L (ref 135–145)

## 2020-08-03 MED ORDER — SODIUM CHLORIDE 0.9% FLUSH
3.0000 mL | Freq: Two times a day (BID) | INTRAVENOUS | Status: DC
  Administered 2020-08-03: 3 mL via INTRAVENOUS

## 2020-08-03 MED ORDER — SODIUM CHLORIDE 0.9 % IV SOLN: INTRAVENOUS | Status: DC

## 2020-08-03 MED ORDER — MILRINONE LACTATE IN DEXTROSE 20-5 MG/100ML-% IV SOLN
0.1250 ug/kg/min | INTRAVENOUS | Status: AC
  Administered 2020-08-03: 0.125 ug/kg/min via INTRAVENOUS
  Filled 2020-08-03 (×2): qty 100

## 2020-08-03 MED ORDER — SODIUM CHLORIDE 0.9 % IV SOLN: 250.0000 mL | INTRAVENOUS | Status: DC | PRN

## 2020-08-03 MED ORDER — FUROSEMIDE 10 MG/ML IJ SOLN
60.0000 mg | Freq: Two times a day (BID) | INTRAMUSCULAR | Status: DC
  Administered 2020-08-03 (×2): 60 mg via INTRAVENOUS
  Filled 2020-08-03 (×2): qty 6

## 2020-08-03 MED ORDER — LORAZEPAM 0.5 MG PO TABS: 0.5000 mg | ORAL_TABLET | Freq: Two times a day (BID) | ORAL | Status: DC | PRN

## 2020-08-03 MED ORDER — SODIUM CHLORIDE 0.9% FLUSH: 3.0000 mL | INTRAVENOUS | Status: DC | PRN

## 2020-08-03 MED ORDER — FUROSEMIDE 40 MG PO TABS: 60.0000 mg | ORAL_TABLET | Freq: Every day | ORAL | Status: DC

## 2020-08-03 MED ORDER — MIDODRINE HCL 5 MG PO TABS
5.0000 mg | ORAL_TABLET | Freq: Three times a day (TID) | ORAL | Status: DC
  Administered 2020-08-03 (×2): 5 mg via ORAL
  Filled 2020-08-03 (×2): qty 1

## 2020-08-03 MED ORDER — ZOLPIDEM TARTRATE 5 MG PO TABS: 5.0000 mg | ORAL_TABLET | Freq: Every evening | ORAL | Status: DC | PRN

## 2020-08-03 MED ORDER — ASPIRIN 81 MG PO CHEW
81.0000 mg | CHEWABLE_TABLET | ORAL | Status: AC
  Administered 2020-08-04: 81 mg via ORAL
  Filled 2020-08-03: qty 1

## 2020-08-03 NOTE — Consult Note (Addendum)
Cardiology Consultation:   Patient ID: Cheryl Vargas MRN: [id number]; DOB: [birth year]  Admit date: 08/02/2020 Date of Consult: 08/03/2020  Primary Care Provider: Lavone Orn, MD [facility] Cardiologist: Dr. [facility] Electrophysiologist:  [name], MD    Patient Profile:   Cheryl Vargas is a 85 y.o. female with a hx of HTN, HLD, permanent AFbv (s/p AV node ablation), NICM, chronic CHF (systolic) who is being seen today for the evaluation of CRT at the request of Dr. Aundra Dubin.  History of Present Illness:   [name] was admitted yesterday with acute/chronic CHF, EP is asked to see her to see if we can optimize her CRT device and try an narrow her QRS. VP% also not ideal, though this 2/2 PVCs and amiodarone was restarted this admission (previously not tolerated  With fatiure)  Device information MDT CRT-D, implanted 09/08/2017 RV/LV leads, VVIR  The patient did not sleep well last night but is feeling better and less SOB today then last night, just tired No CP, palpitations   Past Medical History:  Diagnosis Date  . Arthritis   . Chronic combined systolic (congestive) and diastolic (congestive) heart failure ([place])   . Mitral regurgitation   . Non-ischemic cardiomyopathy ([place])   . Persistent atrial fibrillation ([place])   . Ventricular tachycardia [facility])     Past Surgical History:  Procedure Laterality Date  . AV NODE ABLATION N/A 09/07/2017   Procedure: AV NODE ABLATION;  Surgeon: [name], MD;  Location: [place] CV LAB;  Service: Cardiovascular;  Laterality: N/A;  . BIV ICD INSERTION CRT-D N/A 09/07/2017   Procedure: BIV ICD INSERTION CRT-D;  Surgeon: [name], MD;  Location: [place] CV LAB;  Service: Cardiovascular;  Laterality: N/A;  . BUNIONECTOMY    . CARDIAC CATHETERIZATION     in 2004 at [facility]. "Insignificant blockage" per patient  . CARDIAC CATHETERIZATION N/A 04/25/2016   Procedure: Left Heart Cath and Coronary  Angiography;  Surgeon: Nelva Bush, MD;  Location: [place] CV LAB;  Service: Cardiovascular;  Laterality: N/A;  . CARDIOVERSION N/A 06/29/2017   Procedure: CARDIOVERSION;  Surgeon: Larey Dresser, MD;  Location: [facility] ENDOSCOPY;  Service: Cardiovascular;  Laterality: N/A;  . CARDIOVERSION N/A 08/28/2017   Procedure: CARDIOVERSION;  Surgeon: Larey Dresser, MD;  Location: [facility] ENDOSCOPY;  Service: Cardiovascular;  Laterality: N/A;  . RIGHT HEART CATH N/A 09/29/2019   Procedure: RIGHT HEART CATH;  Surgeon: Larey Dresser, MD;  Location: [place] CV LAB;  Service: Cardiovascular;  Laterality: N/A;  . SHOULDER SURGERY     closed reduction  . TEE WITHOUT CARDIOVERSION N/A 06/29/2017   Procedure: TRANSESOPHAGEAL ECHOCARDIOGRAM (TEE);  Surgeon: Larey Dresser, MD;  Location: [facility] ENDOSCOPY;  Service: Cardiovascular;  Laterality: N/A;  . TONSILLECTOMY    . TOTAL HIP ARTHROPLASTY Left 03/18/2013   Procedure: TOTAL HIP ARTHROPLASTY ANTERIOR APPROACH;  Surgeon: Mauri Pole, MD;  Location: [facility] ORS;  Service: Orthopedics;  Laterality: Left;  . TUBAL LIGATION       Home Medications:  Prior to Admission medications   Medication Sig Start Date End Date Taking? Authorizing Provider  acetaminophen (TYLENOL) 500 MG tablet Take 500 mg by mouth every 6 (six) hours as needed for headache.   Yes [provider]  bisoprolol (ZEBETA) 5 MG tablet Take 2.5 mg by mouth daily.    Yes [provider]  calcium carbonate (TUMS - DOSED IN MG ELEMENTAL CALCIUM) 500 MG chewable tablet Chew 1 tablet by mouth  daily as needed for indigestion or heartburn.   Yes [provider]  empagliflozin (JARDIANCE) 10 MG TABS tablet Take 1 tablet (10 mg total) by mouth daily before breakfast. 07/06/20  Yes Larey Dresser, MD  fluticasone [facility]) 50 MCG/ACT nasal spray Place 1 spray into both nostrils daily as needed for allergies or rhinitis.    Yes [provider]  furosemide (LASIX) 20 MG  tablet Take 2 tablets (40 mg total) by mouth every morning AND 1 tablet (20 mg total) every evening. Patient taking differently: 1 tablet 20mg  orally daily 07/21/20  Yes Larey Dresser, MD  LORazepam (ATIVAN) 0.5 MG tablet Take 1 tablet (0.5 mg total) by mouth daily as needed (nausea). 02/06/20  Yes Bonnielee Haff, MD  potassium chloride SA (KLOR-CON M20) 20 MEQ tablet Take 1 tablet (20 mEq total) by mouth daily. 05/14/20 08/12/20 Yes Larey Dresser, MD  Rivaroxaban (XARELTO) 15 MG TABS tablet Take 1 tablet (15 mg total) by mouth daily with supper. 12/11/19  Yes Larey Dresser, MD  sennosides-docusate sodium (SENOKOT-S) 8.6-50 MG tablet Take 1 tablet by mouth as needed for constipation.   Yes [provider]  zolpidem (AMBIEN) 5 MG tablet Take 1 tablet (5 mg total) by mouth at bedtime. Patient taking differently: Take 7.5 mg by mouth at bedtime.  02/06/20  Yes Bonnielee Haff, MD    Inpatient Medications: Scheduled Meds: . amiodarone  200 mg Oral BID  . [START ON 08/04/2020] aspirin  81 mg Oral Pre-Cath  . empagliflozin  10 mg Oral Daily  . furosemide  60 mg Intravenous BID  . midodrine  5 mg Oral TID WC  . sodium chloride flush  3 mL Intravenous Q12H  . sodium chloride flush  3 mL Intravenous Q12H  . zolpidem  5 mg Oral QHS   Continuous Infusions: . sodium chloride    . sodium chloride    . [START ON 08/04/2020] sodium chloride    . milrinone 0.125 mcg/kg/min (08/03/20 1005)   PRN Meds: sodium chloride, sodium chloride, acetaminophen, LORazepam, sodium chloride flush, sodium chloride flush, zolpidem  Allergies:    Allergies  Allergen Reactions  . Sotalol Other (See Comments)    Prolonged QTc  . Alendronate Sodium Other (See Comments)    Arm pain  . Ciprofloxacin Other (See Comments)    Not effective  . Compazine [Prochlorperazine]   . Coreg [Carvedilol] Other (See Comments)    Fatigue   . Metoprolol Other (See Comments)    Profound fatigue    Social History:     Social History   Socioeconomic History  . Marital status: Widowed    Spouse name: Not on file  . Number of children: Not on file  . Years of education: Not on file  . Highest education level: Not on file  Occupational History  . Not on file  Tobacco Use  . Smoking status: Never Smoker  . Smokeless tobacco: Never Used  Vaping Use  . Vaping Use: Never used  Substance and Sexual Activity  . Alcohol use: Yes    Alcohol/week: 3.0 standard drinks    Types: 3 Glasses of wine per week    Comment: per week  . Drug use: No  . Sexual activity: Not on file  Other Topics Concern  . Not on file  Social History Narrative  . Not on file   Social Determinants of Health   Financial Resource Strain:   . Difficulty of Paying Living Expenses: Not on file  Food Insecurity:   . Worried About Charity fundraiser in the Last Year: Not on file  . Ran Out of Food in the Last Year: Not on file  Transportation Needs:   . Lack of Transportation (Medical): Not on file  . Lack of Transportation (Non-Medical): Not on file  Physical Activity:   . Days of Exercise per Week: Not on file  . Minutes of Exercise per Session: Not on file  Stress:   . Feeling of Stress : Not on file  Social Connections:   . Frequency of Communication with Friends and Family: Not on file  . Frequency of Social Gatherings with Friends and Family: Not on file  . Attends Religious Services: Not on file  . Active Member of Clubs or Organizations: Not on file  . Attends Archivist Meetings: Not on file  . Marital Status: Not on file  Intimate Partner Violence:   . Fear of Current or Ex-Partner: Not on file  . Emotionally Abused: Not on file  . Physically Abused: Not on file  . Sexually Abused: Not on file    Family History:   Family History  Problem Relation Age of Onset  . Lung cancer Mother   . Heart attack Father      ROS:  Please see the history of present illness.  All other ROS reviewed and  negative.     Physical Exam/Data:   Vitals:   08/03/20 0357 08/03/20 0400 08/03/20 0733 08/03/20 1109  BP: 92/63  (!) 92/54 99/66  Pulse: 81  81 85  Resp: 17  20 17   Temp: 97.9 F (36.6 C)  (!) 97.4 F (36.3 C) 97.6 F (36.4 C)  TempSrc: Oral  Oral Oral  SpO2: 96%  98% 92%  Weight:  56.6 kg    Height:        Intake/Output Summary (Last 24 hours) at 08/03/2020 1253 Last data filed at 08/03/2020 1244 Gross per 24 hour  Intake 660 ml  Output 1150 ml  Net -490 ml   Last 3 Weights 08/03/2020 08/02/2020 07/06/2020  Weight (lbs) 124 lb 12.5 oz 125 lb 9.6 oz 125 lb 6.4 oz  Weight (kg) 56.6 kg 56.972 kg 56.881 kg     Body mass index is 20.76 kg/m.  General:  Well nourished, well developed, in no acute distress, thin, somewhat chronically ill appearing HEENT: normal Lymph: no adenopathy Neck: no JVD Endocrine:  No thryomegaly Vascular: No carotid bruits  Cardiac: RRR; no murmurs, gallops or rubs Lungs:  CTA b/l, no wheezing, rhonchi or rales  Abd: soft, nontender, no hepatomegaly  Ext: no edema Musculoskeletal:  No deformities,  Skin: warm and dry  Neuro:  no focal abnormalities noted Psych:  Normal affect   EKG:  The EKG was personally reviewed and demonstrates:    Starting EKG V paced, Negative lead one, QS V1, QRS 177ms Final is Vpaced, negative lead I and small R wave in V1, QRS 125ms  Telemetry:  Telemetry was personally reviewed and demonstrates:   SR, intermittent PVCs  Relevant CV Studies:  08/02/2020: TTE IMPRESSIONS  1. Left ventricular ejection fraction, by estimation, is 20 to 25%. The  left ventricle has severely decreased function. The left ventricle  demonstrates global hypokinesis. There is mild left ventricular  hypertrophy. Left ventricular diastolic parameters  are indeterminate.  2. Right ventricular systolic function is low normal. The right  ventricular size is normal. A n AICD wire is visualized. There is mildly  elevated  pulmonary  artery systolic pressure. The estimated right  ventricular systolic pressure is 29.9 mmHg.  3. Left atrial size was severely dilated.  4. Right atrial size was severely dilated.  5. The pericardial effusion is posterior to the left ventricle.  6. The mitral valve is abnormal. Severe mitral valve regurgitation.  7. The tricuspid valve is abnormal. Tricuspid valve regurgitation is  severe.  8. The aortic valve is abnormal. Aortic valve regurgitation is moderate.  Mild aortic valve sclerosis is present, with no evidence of aortic valve  stenosis.  9. The inferior vena cava is normal in size with <50% respiratory  variability, suggesting right atrial pressure of 8 mmHg.   Comparison(s): Changes from prior study are noted. 09/18/2019: LVEF <20%,  severe biatrial enlargment, mild to moderate MR, mild AI.   Laboratory Data:  High Sensitivity Troponin:  No results for input(s): TROPONINIHS in the last 720 hours.   Chemistry Recent Labs  Lab 07/28/20 0949 08/02/20 0832 08/03/20 0248  NA 137 138 136  K 3.3* 3.4* 3.8  CL 99 100 100  CO2 27 26 22   GLUCOSE 135* 121* 121*  BUN 31* 38* 42*  CREATININE 1.78* 1.52* 1.74*  CALCIUM 9.0 9.1 8.6*  GFRNONAA 28* 34* 29*  ANIONGAP 11 12 14     No results for input(s): PROT, ALBUMIN, AST, ALT, ALKPHOS, BILITOT in the last 168 hours. Hematology Recent Labs  Lab 08/02/20 0832  WBC 6.5  RBC 4.87  HGB 15.1*  HCT 45.4  MCV 93.2  MCH 31.0  MCHC 33.3  RDW 13.1  PLT 151   BNP Recent Labs  Lab 08/02/20 0832  BNP 1,285.0*    DDimer No results for input(s): DDIMER in the last 168 hours.   Radiology/Studies:  DG Chest 2 View  Result Date: 08/02/2020 CLINICAL DATA:  Shortness of breath, ongoing shortness of breath and lower abdominal pain EXAM: CHEST - 2 VIEW COMPARISON:  02/01/2020 FINDINGS: LEFT-sided dual lead pacer defibrillator with similar position. Power pack over LEFT chest. Cardiomediastinal contours stable accounting for  technical factors such as projection and development of some basilar airspace disease since the previous study. Graded opacity on frontal view with obscured LEFT and RIGHT hemidiaphragms. Potential trace pleural effusion. Flattening of LEFT and RIGHT hemidiaphragm. Vascular congestion with mild increased interstitial prominence. On limited assessment no acute skeletal process. IMPRESSION: New basilar airspace disease. Potential trace effusions on the LEFT and RIGHT. Findings may represent sequela of heart failure with basilar volume loss. Difficult to exclude infection at the lung bases. Electronically Signed   By: Zetta Bills M.D.   On: 08/02/2020 08:49     Assessment and Plan:   1. Acute on chronic CHF     AHF tema is managing     On milrinone     RHC tomorrow  Device interrogation done today Battery and lead measurements are good VP 87.9% effective VP 87.9% 2 NSVT episodes only, 10 and 15 beats With EKG LV lead was programmed to same vector LV2-LV3, changed 43ms offset to 52ms LV1st This gave a R wave in V1, lead 1 remained negative QRS duration 128ms LV lead threshold remained unchanged I have left EKGs for Dr. Rayann Heman to review He will see the patient later today  The patient mentions that when she has had weak spells her HR via her watch will say 50's I suspect this is PVCs not true HR She is set VVIR 80bpm At loss of V capture today note no R waves.  For questions or updates, please contact [place] Please consult [email or web address] for contact info under    Signed, Baldwin [place], PA-C  08/03/2020 12:53 PM    I have seen, examined the patient, and reviewed the above assessment and plan.  Changes to above are made where necessary.  On exam, RRR.  Device reprogrammed as above for VV timing optimization.  Electrophysiology team to see as needed while here. Please call with questions.   Co Sign: [name], MD 08/04/2020 3:07 PM

## 2020-08-03 NOTE — Progress Notes (Signed)
PT Screen  Patient Details Name: Cheryl Vargas MRN: [id number] DOB: 07-26-1935   Cancelled Treatment:    Reason Eval/Treat Not Completed: Other (comment)  Pt declined need for PT.  At arrival , she was up ambulating independently.  Also, noted pt ambulated earlier with mobility tech.  She denies hx of falls and reports she feels safe ambulating.  Denied the pre-syncope symptoms that she has experienced in the past. Educated on PT role and plan of care and pt declined need for PT today or during this admission .  Will sign off as pt denied PT needs. Abran Richard, PT Acute Rehab Services Pager [phone] [facility] [place] 08/03/2020, 4:55 PM

## 2020-08-03 NOTE — H&P (View-Only) (Signed)
Advanced Heart Failure Rounding Note  PCP-Cardiologist: [name], MD   Subjective:    Yesterday diuresed with IV lasix. Creatinine trending up 1.5>1.7  ECHO EF 20-25% RV normal, biatrial enlargement, IVC normal.   Had a terrible night.  On and off short of breath.    Objective:   Weight Range: 56.6 kg Body mass index is 20.76 kg/m.   Vital Signs:   Temp:  [97.3 F (36.3 C)-98 F (36.7 C)] 97.4 F (36.3 C) (11/23 0733) Pulse Rate:  [74-87] 81 (11/23 0733) Resp:  [6-25] 20 (11/23 0733) BP: (66-114)/(54-86) 92/54 (11/23 0733) SpO2:  [94 %-100 %] 98 % (11/23 0733) Weight:  [56.6 kg-57 kg] 56.6 kg (11/23 0400) Last BM Date: 08/02/20  Weight change: Filed Weights   08/02/20 0927 08/03/20 0400  Weight: 57 kg 56.6 kg    Intake/Output:   Intake/Output Summary (Last 24 hours) at 08/03/2020 0736 Last data filed at 08/02/2020 2038 Gross per 24 hour  Intake --  Output 700 ml  Net -700 ml      Physical Exam    General:  No resp difficulty HEENT: Normal Neck: Supple. JVP 10-12 cm. Carotids 2+ bilat; no bruits. No lymphadenopathy or thyromegaly appreciated. Cor: PMI nondisplaced. Regular rate & rhythm. No rubs, gallops or murmurs. Lungs: Clear Abdomen: Soft, nontender, nondistended. No hepatosplenomegaly. No bruits or masses. Good bowel sounds. Extremities: No cyanosis, clubbing, rash, edema Neuro: Alert & orientedx3, cranial nerves grossly intact. moves all 4 extremities w/o difficulty. Affect pleasant   Telemetry   Vpaced 80s, underlying atrial fibrillation.  She had a run of NSVT this morning, still with PVCs.  Personally reviewed.   EKG    N/A   Labs    CBC Recent Labs    08/02/20 0832  WBC 6.5  HGB 15.1*  HCT 45.4  MCV 93.2  PLT 496   Basic Metabolic Panel Recent Labs    08/02/20 0832 08/03/20 0248  NA 138 136  K 3.4* 3.8  CL 100 100  CO2 26 22  GLUCOSE 121* 121*  BUN 38* 42*  CREATININE 1.52* 1.74*  CALCIUM 9.1 8.6*    Liver Function Tests No results for input(s): AST, ALT, ALKPHOS, BILITOT, PROT, ALBUMIN in the last 72 hours. No results for input(s): LIPASE, AMYLASE in the last 72 hours. Cardiac Enzymes No results for input(s): CKTOTAL, CKMB, CKMBINDEX, TROPONINI in the last 72 hours.  BNP: BNP (last 3 results) Recent Labs    09/27/19 1708 08/02/20 0832  BNP 1,032.7* 1,285.0*    ProBNP (last 3 results) No results for input(s): PROBNP in the last 8760 hours.   D-Dimer No results for input(s): DDIMER in the last 72 hours. Hemoglobin A1C No results for input(s): HGBA1C in the last 72 hours. Fasting Lipid Panel No results for input(s): CHOL, HDL, LDLCALC, TRIG, CHOLHDL, LDLDIRECT in the last 72 hours. Thyroid Function Tests No results for input(s): TSH, T4TOTAL, T3FREE, THYROIDAB in the last 72 hours.  Invalid input(s): FREET3  Other results:   Imaging    DG Chest 2 View  Result Date: 08/02/2020 CLINICAL DATA:  Shortness of breath, ongoing shortness of breath and lower abdominal pain EXAM: CHEST - 2 VIEW COMPARISON:  02/01/2020 FINDINGS: LEFT-sided dual lead pacer defibrillator with similar position. Power pack over LEFT chest. Cardiomediastinal contours stable accounting for technical factors such as projection and development of some basilar airspace disease since the previous study. Graded opacity on frontal view with obscured LEFT and RIGHT hemidiaphragms.  Potential trace pleural effusion. Flattening of LEFT and RIGHT hemidiaphragm. Vascular congestion with mild increased interstitial prominence. On limited assessment no acute skeletal process. IMPRESSION: New basilar airspace disease. Potential trace effusions on the LEFT and RIGHT. Findings may represent sequela of heart failure with basilar volume loss. Difficult to exclude infection at the lung bases. Electronically Signed   By: Zetta Bills M.D.   On: 08/02/2020 08:49   ECHOCARDIOGRAM COMPLETE  Result Date: 08/02/2020     ECHOCARDIOGRAM REPORT   Patient Name:   MANNAT BENEDETTI Date of Exam: 08/02/2020 Medical Rec #:  [id number]       Height:       65.0 in Accession #:    [id number]      Weight:       125.6 lb Date of Birth:  10/10/1934      BSA:          1.623 m Patient Age:    7 years        BP:           101/74 mmHg Patient Gender: F               HR:           81 bpm. Exam Location:  Inpatient Procedure: 2D Echo, Cardiac Doppler and Color Doppler Indications:    CHF  History:        Patient has prior history of Echocardiogram examinations, most                 recent 09/18/2019. CHF, Defibrillator, Arrythmias:Atrial                 Fibrillation; Risk Factors:Hypertension and Dyslipidemia.  Sonographer:    Clayton Lefort RDCS (AE) Referring Phys: [phone] [name] D CLEGG IMPRESSIONS  1. Left ventricular ejection fraction, by estimation, is 20 to 25%. The left ventricle has severely decreased function. The left ventricle demonstrates global hypokinesis. There is mild left ventricular hypertrophy. Left ventricular diastolic parameters  are indeterminate.  2. Right ventricular systolic function is low normal. The right ventricular size is normal. A n AICD wire is visualized. There is mildly elevated pulmonary artery systolic pressure. The estimated right ventricular systolic pressure is 41.6 mmHg.  3. Left atrial size was severely dilated.  4. Right atrial size was severely dilated.  5. The pericardial effusion is posterior to the left ventricle.  6. The mitral valve is abnormal. Severe mitral valve regurgitation.  7. The tricuspid valve is abnormal. Tricuspid valve regurgitation is severe.  8. The aortic valve is abnormal. Aortic valve regurgitation is moderate. Mild aortic valve sclerosis is present, with no evidence of aortic valve stenosis.  9. The inferior vena cava is normal in size with <50% respiratory variability, suggesting right atrial pressure of 8 mmHg. Comparison(s): Changes from prior study are noted. 09/18/2019: LVEF <20%, severe  biatrial enlargment, mild to moderate MR, mild AI. FINDINGS  Left Ventricle: Left ventricular ejection fraction, by estimation, is 20 to 25%. The left ventricle has severely decreased function. The left ventricle demonstrates global hypokinesis. The left ventricular internal cavity size was normal in size. There is mild left ventricular hypertrophy. Left ventricular diastolic parameters are indeterminate. Right Ventricle: The right ventricular size is normal. No increase in right ventricular wall thickness. Right ventricular systolic function is low normal. There is mildly elevated pulmonary artery systolic pressure. The tricuspid regurgitant velocity is 2.78 m/s, and with an assumed right atrial pressure of 8 mmHg, the estimated right ventricular  systolic pressure is 67.6 mmHg. Left Atrium: Left atrial size was severely dilated. Right Atrium: Right atrial size was severely dilated. Pericardium: Trivial pericardial effusion is present. The pericardial effusion is posterior to the left ventricle. Mitral Valve: The mitral valve is abnormal. There is moderate thickening of the mitral valve leaflet(s). Severe mitral valve regurgitation, with centrally-directed jet. Tricuspid Valve: The tricuspid valve is abnormal. Tricuspid valve regurgitation is severe. Aortic Valve: The aortic valve is abnormal. Aortic valve regurgitation is moderate. Aortic regurgitation PHT measures 350 msec. Mild aortic valve sclerosis is present, with no evidence of aortic valve stenosis. Pulmonic Valve: The pulmonic valve was grossly normal. Pulmonic valve regurgitation is mild. Aorta: The aortic root and ascending aorta are structurally normal, with no evidence of dilitation. Venous: The inferior vena cava is normal in size with less than 50% respiratory variability, suggesting right atrial pressure of 8 mmHg. IAS/Shunts: No atrial level shunt detected by color flow Doppler. Additional Comments: A n AICD wire is visualized.  LEFT VENTRICLE PLAX  2D LVIDd:         5.30 cm LVIDs:         5.00 cm LV PW:         1.20 cm LV IVS:        1.10 cm LVOT diam:     1.80 cm LV SV:         19 LV SV Index:   11 LVOT Area:     2.54 cm  LV Volumes (MOD) LV vol d, MOD A2C: 137.0 ml LV vol d, MOD A4C: 112.0 ml LV vol s, MOD A2C: 91.9 ml LV vol s, MOD A4C: 91.7 ml LV SV MOD A2C:     45.1 ml LV SV MOD A4C:     112.0 ml LV SV MOD BP:      31.8 ml RIGHT VENTRICLE             IVC RV Basal diam:  3.60 cm     IVC diam: 1.70 cm RV Mid diam:    2.70 cm RV S prime:     10.00 cm/s TAPSE (M-mode): 1.9 cm LEFT ATRIUM              Index       RIGHT ATRIUM           Index LA diam:        4.90 cm  3.02 cm/m  RA Area:     27.30 cm LA Vol (A2C):   126.0 ml 77.62 ml/m RA Volume:   97.30 ml  59.94 ml/m LA Vol (A4C):   86.1 ml  53.04 ml/m LA Biplane Vol: 105.0 ml 64.68 ml/m  AORTIC VALVE LVOT Vmax:   49.73 cm/s LVOT Vmean:  32.367 cm/s LVOT VTI:    0.073 m AI PHT:      350 msec  AORTA Ao Root diam: 3.00 cm MR Peak grad:    79.6 mmHg   TRICUSPID VALVE MR Mean grad:    47.0 mmHg   TR Peak grad:   30.9 mmHg MR Vmax:         446.00 cm/s TR Vmax:        278.00 cm/s MR Vmean:        315.0 cm/s MR PISA:         1.57 cm    SHUNTS MR PISA Eff ROA: 14 mm      Systemic VTI:  0.07 m MR PISA Radius:  0.50 cm  Systemic Diam: 1.80 cm Lyman Bishop MD Electronically signed by Lyman Bishop MD Signature Date/Time: 08/02/2020/5:00:11 PM    Final       Medications:     Scheduled Medications: . amiodarone  200 mg Oral BID  . empagliflozin  10 mg Oral Daily  . furosemide  40 mg Intravenous BID  . midodrine  2.5 mg Oral TID WC  . Rivaroxaban  15 mg Oral Q supper  . sodium chloride flush  3 mL Intravenous Q12H  . zolpidem  5 mg Oral QHS     Infusions: . sodium chloride       PRN Medications:  sodium chloride, acetaminophen, sodium chloride flush    Patient Profile   Ms Elbe is an 85 year old with a history ofHTN, HLD, PAF (On Xarelto), and nonischemic cardiomyopathy with  chronic systolic CHF. Cardiomyopathy dates back to at least 2014. Most recent ECHO 09/2019 showed EF < 20%.  Intolerant ranolazine, losartan,spironolactone, amiodarone, sotolol, and tikosyn.  Admitted with presyncope/volume overload.   Assessment/Plan  1. A/C Systolic Heart Failure.  [place] 04/2016 showed no significant coronary disease. Cardiac MRI 10/17 EF 36%, diffuse HK, normal RV, biatrial enlargement, moderate MR, non-coronary LGE pattern involving the mid-wall of the basal to mid septum and inferior wall. Possibly prior myocarditis versus a form of infiltrative disease. Medtronic CRT-D s/p AV nodal ablation. Echo in 1/21 showed EF <20% with moderate LV dilation and mildly decreased RV systolic function. Low output on 1/21 RHC (CI 1.72).   Has Medtronic CRT. QRS wide ? If EP can optimize.  - Echo - EF 20-25%. RV low normal. IVC normal.  Hold bb with soft BP and will try amio again.  - Volume status improved. Per Dr Aundra Dubin continue IV lasix 60 mg twice a day. Add milrinone 0.125 to help with diuresis. No plan for home milrinone.  - Intolerant  Losartan and spiro  - Follow renal function closely.  - She is not a candidate for LVAD with age/frailty. - Set up [place].  - Will need Palliative Care for goals of care.   - Consult cardiac rehab.   2. Presyncope ? This has been going on for some time. Interrogate device.  BP remains soft.  Increase midodrine 5 mg tid three times a day   3. PVCs  In the past she was intolerant amio due to fatigue. She would like to try amiodarone gain given  limited options.  - Continue amio 200 mg twice a day.   4. A fib, chronic   S/P AV nodal Ablation - Continue xarelto.   5. CKD Stage IIIa Creatinine on admit 1.5. Today creatinine 1.7.  - Check BMET in am   Set up for RHC to assess hemodynamics.   Add ativan as needed. She usually takes daily.    Discussed with Dr Aundra Dubin will start 0.125 mcg milrinone and continue IV lasix today. Plan  to stop milrinone prior to [place].    Consult Palliative Care to assist with goals of care.   Length of Stay: 1  [name] Clegg, NP  08/03/2020, 7:36 AM  Advanced Heart Failure Team Pager [phone] (M-F; [place])  Please contact [facility] for night-coverage after hours (4p -7a ) and weekends on [email or web address]  Patient seen with NP, agree with the above note.   She had a difficult night, was orthopneic and had significant dyspnea.  Creatinine today up to 1.7, got IV Lasix yesterday.  SBP 90s.    Still with PVCs  and run of NSVT this morning.  Started on amiodarone yesterday.   General: NAD Neck: JVP 10-12 cm, no thyromegaly or thyroid nodule.  Lungs: Clear to auscultation bilaterally with normal respiratory effort. CV: Lateral PMI.  Heart regular S1/S2, no S3/S4, 2/6 HSM LLSB/apex.  No peripheral edema.  No carotid bruit.  Normal pedal pulses.  Abdomen: Soft, nontender, no hepatosplenomegaly, no distention.  Skin: Intact without lesions or rashes.  Neurologic: Alert and oriented x 3.  Psych: Normal affect. Extremities: No clubbing or cyanosis.  HEENT: Normal.   1. Acute on chronic systolic HF: [place] 09/6107 showed no significant coronary disease. Cardiac MRI 10/17 EF 36%, diffuse HK, normal RV, biatrial enlargement, moderate MR, non-coronary LGE pattern involving the mid-wall of the basal to mid septum and inferior wall. Possibly prior myocarditis versus a form of infiltrative disease. Medtronic CRT-D s/p AV nodal ablation. Echo in 1/21 showed EF <20% with moderate LV dilation and mildly decreased RV systolic function. Low output on 1/21 RHC (CI 1.72). With age and frailty, LVAD not thought to be good option. At last appointment, she was not BiV pacing at an ideal percentage, likely due to PVCs. Symptomatically worse, NYHA class IIIb since decreasing Lasix in setting of rising creatinine. Suspect low output HF.  Echo this admission with EF 20-25%, RV low normal systolic function, severe  MR and TR.  JVD on exam today, suspect ongoing volume overload.  She was short of breath overnight.  - With history of low output HF, will start milrinone 0.125 mcg/kg/min today to facilitate diuresis.  - Lasix 60 mg IV bid today with milrinone.  - RHC arranged for tomorrow morning, will stop milrinone prior to RHC to get cardiac output numbers off milrinone.  - She is off losartan and spironolactone with symptomatic hypotension, will leave off for now.   - Low BP and presyncopal spells, increase midodrine to 5 mg tid.  - Can continue Jardiance 10 mg daily. -Hold bisoprolol as retrying amiodarone as below.  -Off digoxin with persistently elevated level despite cutting back to 0.0625 mg qod. - I will ask EP to see her => paced QRS is relatively wide, I wonder if there is any form of optimization that can be done to narrow the QRS.  - Concerned that frequent PVCs may be affecting function (and also limiting BiV pacing percentage) => Retrying her on amiodarone 200 mg bid.  - She does not qualify for [place] given age, but I would like her eventually evaluated for participation in ANTHEM trial as vagal stimulation may improve her symptoms. 2. Atrial fibrillation: Chronic, s/p AV nodal ablation and BiV pacing.  - Continue Xarelto 15 mg daily (hold for RHC tomorrow).  3. PVCs/VT: Frequent PVCs have limited BiV pacing. Not a good PVC ablation candidate per EP. Had VT in 1/21. Cannot tolerate amiodarone, sotolol, or Tikosyn. Did not tolerate mexilitine with nausea. Had nausea also with ranolazine. Additionally, did not tolerate further uptitration of bisoprolol.  - We will try again to suppress PVCs with amiodarone, start at 200 mg bid.  Will hold bisoprolol since starting amiodarone. Tolerating so far. 4. Mitral regurgitation: I reviewed her echo.  At least 3+ MR, suspect functional due to dilated annulus with severely dilated LA.  TR appears worse (clearly severe).   - I will ask Dr. Burt Knack to  look at echo to review for Mitraclip. However, I think that TR is probably more of a problem.  5. AKI on CKD stage 3: Creatinine up to  1.7 with Lasix IV, suspect cardiorenal.   - Starting milrinone 0.125 to facilitate diuresis as above.   I am also going to engage palliative care service for goals of care discussions.  Home hospice may end up being her best option.   [name] 08/03/2020 10:16 AM

## 2020-08-03 NOTE — Progress Notes (Addendum)
Advanced Heart Failure Rounding Note  PCP-Cardiologist: [name], MD   Subjective:    Yesterday diuresed with IV lasix. Creatinine trending up 1.5>1.7  ECHO EF 20-25% RV normal, biatrial enlargement, IVC normal.   Had a terrible night.  On and off short of breath.    Objective:   Weight Range: 56.6 kg Body mass index is 20.76 kg/m.   Vital Signs:   Temp:  [97.3 F (36.3 C)-98 F (36.7 C)] 97.4 F (36.3 C) (11/23 0733) Pulse Rate:  [74-87] 81 (11/23 0733) Resp:  [6-25] 20 (11/23 0733) BP: (66-114)/(54-86) 92/54 (11/23 0733) SpO2:  [94 %-100 %] 98 % (11/23 0733) Weight:  [56.6 kg-57 kg] 56.6 kg (11/23 0400) Last BM Date: 08/02/20  Weight change: Filed Weights   08/02/20 0927 08/03/20 0400  Weight: 57 kg 56.6 kg    Intake/Output:   Intake/Output Summary (Last 24 hours) at 08/03/2020 0736 Last data filed at 08/02/2020 2038 Gross per 24 hour  Intake -  Output 700 ml  Net -700 ml      Physical Exam    General:  No resp difficulty HEENT: Normal Neck: Supple. JVP 10-12 cm. Carotids 2+ bilat; no bruits. No lymphadenopathy or thyromegaly appreciated. Cor: PMI nondisplaced. Regular rate & rhythm. No rubs, gallops or murmurs. Lungs: Clear Abdomen: Soft, nontender, nondistended. No hepatosplenomegaly. No bruits or masses. Good bowel sounds. Extremities: No cyanosis, clubbing, rash, edema Neuro: Alert & orientedx3, cranial nerves grossly intact. moves all 4 extremities w/o difficulty. Affect pleasant   Telemetry   Vpaced 80s, underlying atrial fibrillation.  She had a run of NSVT this morning, still with PVCs.  Personally reviewed.   EKG    N/A   Labs    CBC Recent Labs    08/02/20 0832  WBC 6.5  HGB 15.1*  HCT 45.4  MCV 93.2  PLT 536   Basic Metabolic Panel Recent Labs    08/02/20 0832 08/03/20 0248  NA 138 136  K 3.4* 3.8  CL 100 100  CO2 26 22  GLUCOSE 121* 121*  BUN 38* 42*  CREATININE 1.52* 1.74*  CALCIUM 9.1 8.6*    Liver Function Tests No results for input(s): AST, ALT, ALKPHOS, BILITOT, PROT, ALBUMIN in the last 72 hours. No results for input(s): LIPASE, AMYLASE in the last 72 hours. Cardiac Enzymes No results for input(s): CKTOTAL, CKMB, CKMBINDEX, TROPONINI in the last 72 hours.  BNP: BNP (last 3 results) Recent Labs    09/27/19 1708 08/02/20 0832  BNP 1,032.7* 1,285.0*    ProBNP (last 3 results) No results for input(s): PROBNP in the last 8760 hours.   D-Dimer No results for input(s): DDIMER in the last 72 hours. Hemoglobin A1C No results for input(s): HGBA1C in the last 72 hours. Fasting Lipid Panel No results for input(s): CHOL, HDL, LDLCALC, TRIG, CHOLHDL, LDLDIRECT in the last 72 hours. Thyroid Function Tests No results for input(s): TSH, T4TOTAL, T3FREE, THYROIDAB in the last 72 hours.  Invalid input(s): FREET3  Other results:   Imaging    DG Chest 2 View  Result Date: 08/02/2020 CLINICAL DATA:  Shortness of breath, ongoing shortness of breath and lower abdominal pain EXAM: CHEST - 2 VIEW COMPARISON:  02/01/2020 FINDINGS: LEFT-sided dual lead pacer defibrillator with similar position. Power pack over LEFT chest. Cardiomediastinal contours stable accounting for technical factors such as projection and development of some basilar airspace disease since the previous study. Graded opacity on frontal view with obscured LEFT and RIGHT hemidiaphragms.  Potential trace pleural effusion. Flattening of LEFT and RIGHT hemidiaphragm. Vascular congestion with mild increased interstitial prominence. On limited assessment no acute skeletal process. IMPRESSION: New basilar airspace disease. Potential trace effusions on the LEFT and RIGHT. Findings may represent sequela of heart failure with basilar volume loss. Difficult to exclude infection at the lung bases. Electronically Signed   By: Zetta Bills M.D.   On: 08/02/2020 08:49   ECHOCARDIOGRAM COMPLETE  Result Date: 08/02/2020     ECHOCARDIOGRAM REPORT   Patient Name:   TALEISHA KACZYNSKI Date of Exam: 08/02/2020 Medical Rec #:  [id number]       Height:       65.0 in Accession #:    [id number]      Weight:       125.6 lb Date of Birth:  [birth year]      BSA:          1.623 m Patient Age:    85 years        BP:           101/74 mmHg Patient Gender: F               HR:           81 bpm. Exam Location:  Inpatient Procedure: 2D Echo, Cardiac Doppler and Color Doppler Indications:    CHF  History:        Patient has prior history of Echocardiogram examinations, most                 recent 09/18/2019. CHF, Defibrillator, Arrythmias:Atrial                 Fibrillation; Risk Factors:Hypertension and Dyslipidemia.  Sonographer:    Clayton Lefort RDCS (AE) Referring Phys: [phone] [name] D CLEGG IMPRESSIONS  1. Left ventricular ejection fraction, by estimation, is 20 to 25%. The left ventricle has severely decreased function. The left ventricle demonstrates global hypokinesis. There is mild left ventricular hypertrophy. Left ventricular diastolic parameters  are indeterminate.  2. Right ventricular systolic function is low normal. The right ventricular size is normal. A n AICD wire is visualized. There is mildly elevated pulmonary artery systolic pressure. The estimated right ventricular systolic pressure is 76.1 mmHg.  3. Left atrial size was severely dilated.  4. Right atrial size was severely dilated.  5. The pericardial effusion is posterior to the left ventricle.  6. The mitral valve is abnormal. Severe mitral valve regurgitation.  7. The tricuspid valve is abnormal. Tricuspid valve regurgitation is severe.  8. The aortic valve is abnormal. Aortic valve regurgitation is moderate. Mild aortic valve sclerosis is present, with no evidence of aortic valve stenosis.  9. The inferior vena cava is normal in size with <50% respiratory variability, suggesting right atrial pressure of 8 mmHg. Comparison(s): Changes from prior study are noted. 09/18/2019: LVEF <20%, severe  biatrial enlargment, mild to moderate MR, mild AI. FINDINGS  Left Ventricle: Left ventricular ejection fraction, by estimation, is 20 to 25%. The left ventricle has severely decreased function. The left ventricle demonstrates global hypokinesis. The left ventricular internal cavity size was normal in size. There is mild left ventricular hypertrophy. Left ventricular diastolic parameters are indeterminate. Right Ventricle: The right ventricular size is normal. No increase in right ventricular wall thickness. Right ventricular systolic function is low normal. There is mildly elevated pulmonary artery systolic pressure. The tricuspid regurgitant velocity is 2.78 m/s, and with an assumed right atrial pressure of 8 mmHg, the estimated right ventricular  systolic pressure is 25.6 mmHg. Left Atrium: Left atrial size was severely dilated. Right Atrium: Right atrial size was severely dilated. Pericardium: Trivial pericardial effusion is present. The pericardial effusion is posterior to the left ventricle. Mitral Valve: The mitral valve is abnormal. There is moderate thickening of the mitral valve leaflet(s). Severe mitral valve regurgitation, with centrally-directed jet. Tricuspid Valve: The tricuspid valve is abnormal. Tricuspid valve regurgitation is severe. Aortic Valve: The aortic valve is abnormal. Aortic valve regurgitation is moderate. Aortic regurgitation PHT measures 350 msec. Mild aortic valve sclerosis is present, with no evidence of aortic valve stenosis. Pulmonic Valve: The pulmonic valve was grossly normal. Pulmonic valve regurgitation is mild. Aorta: The aortic root and ascending aorta are structurally normal, with no evidence of dilitation. Venous: The inferior vena cava is normal in size with less than 50% respiratory variability, suggesting right atrial pressure of 8 mmHg. IAS/Shunts: No atrial level shunt detected by color flow Doppler. Additional Comments: A n AICD wire is visualized.  LEFT VENTRICLE PLAX  2D LVIDd:         5.30 cm LVIDs:         5.00 cm LV PW:         1.20 cm LV IVS:        1.10 cm LVOT diam:     1.80 cm LV SV:         19 LV SV Index:   11 LVOT Area:     2.54 cm  LV Volumes (MOD) LV vol d, MOD A2C: 137.0 ml LV vol d, MOD A4C: 112.0 ml LV vol s, MOD A2C: 91.9 ml LV vol s, MOD A4C: 91.7 ml LV SV MOD A2C:     45.1 ml LV SV MOD A4C:     112.0 ml LV SV MOD BP:      31.8 ml RIGHT VENTRICLE             IVC RV Basal diam:  3.60 cm     IVC diam: 1.70 cm RV Mid diam:    2.70 cm RV S prime:     10.00 cm/s TAPSE (M-mode): 1.9 cm LEFT ATRIUM              Index       RIGHT ATRIUM           Index LA diam:        4.90 cm  3.02 cm/m  RA Area:     27.30 cm LA Vol (A2C):   126.0 ml 77.62 ml/m RA Volume:   97.30 ml  59.94 ml/m LA Vol (A4C):   86.1 ml  53.04 ml/m LA Biplane Vol: 105.0 ml 64.68 ml/m  AORTIC VALVE LVOT Vmax:   49.73 cm/s LVOT Vmean:  32.367 cm/s LVOT VTI:    0.073 m AI PHT:      350 msec  AORTA Ao Root diam: 3.00 cm MR Peak grad:    79.6 mmHg   TRICUSPID VALVE MR Mean grad:    47.0 mmHg   TR Peak grad:   30.9 mmHg MR Vmax:         446.00 cm/s TR Vmax:        278.00 cm/s MR Vmean:        315.0 cm/s MR PISA:         1.57 cm    SHUNTS MR PISA Eff ROA: 14 mm      Systemic VTI:  0.07 m MR PISA Radius:  0.50 cm  Systemic Diam: 1.80 cm Lyman Bishop MD Electronically signed by Lyman Bishop MD Signature Date/Time: 08/02/2020/5:00:11 PM    Final       Medications:     Scheduled Medications: . amiodarone  200 mg Oral BID  . empagliflozin  10 mg Oral Daily  . furosemide  40 mg Intravenous BID  . midodrine  2.5 mg Oral TID WC  . Rivaroxaban  15 mg Oral Q supper  . sodium chloride flush  3 mL Intravenous Q12H  . zolpidem  5 mg Oral QHS     Infusions: . sodium chloride       PRN Medications:  sodium chloride, acetaminophen, sodium chloride flush    Patient Profile   Ms Arntz is an 85 year old with a history ofHTN, HLD, PAF (On Xarelto), and nonischemic cardiomyopathy with  chronic systolic CHF. Cardiomyopathy dates back to at least 2014. Most recent ECHO 09/2019 showed EF < 20%.  Intolerant ranolazine, losartan,spironolactone, amiodarone, sotolol, and tikosyn.  Admitted with presyncope/volume overload.   Assessment/Plan  1. A/C Systolic Heart Failure.  [place] 04/2016 showed no significant coronary disease. Cardiac MRI 10/17 EF 36%, diffuse HK, normal RV, biatrial enlargement, moderate MR, non-coronary LGE pattern involving the mid-wall of the basal to mid septum and inferior wall. Possibly prior myocarditis versus a form of infiltrative disease. Medtronic CRT-D s/p AV nodal ablation. Echo in 1/21 showed EF <20% with moderate LV dilation and mildly decreased RV systolic function. Low output on 1/21 RHC (CI 1.72).   Has Medtronic CRT. QRS wide ? If EP can optimize.  - Echo - EF 20-25%. RV low normal. IVC normal.  Hold bb with soft BP and will try amio again.  - Volume status improved. Per Dr Aundra Dubin continue IV lasix 60 mg twice a day. Add milrinone 0.125 to help with diuresis. No plan for home milrinone.  - Intolerant  Losartan and spiro  - Follow renal function closely.  - She is not a candidate for LVAD with age/frailty. - Set up [place].  - Will need Palliative Care for goals of care.   - Consult cardiac rehab.   2. Presyncope ? This has been going on for some time. Interrogate device.  BP remains soft.  Increase midodrine 5 mg tid three times a day   3. PVCs  In the past she was intolerant amio due to fatigue. She would like to try amiodarone gain given  limited options.  - Continue amio 200 mg twice a day.   4. A fib, chronic   S/P AV nodal Ablation - Continue xarelto.   5. CKD Stage IIIa Creatinine on admit 1.5. Today creatinine 1.7.  - Check BMET in am   Set up for RHC to assess hemodynamics.   Add ativan as needed. She usually takes daily.    Discussed with Dr Aundra Dubin will start 0.125 mcg milrinone and continue IV lasix today. Plan  to stop milrinone prior to [place].    Consult Palliative Care to assist with goals of care.   Length of Stay: 1  [name] Clegg, NP  08/03/2020, 7:36 AM  Advanced Heart Failure Team Pager [phone] (M-F; [place])  Please contact [facility] for night-coverage after hours (4p -7a ) and weekends on [email or web address]  Patient seen with NP, agree with the above note.   She had a difficult night, was orthopneic and had significant dyspnea.  Creatinine today up to 1.7, got IV Lasix yesterday.  SBP 90s.    Still with PVCs  and run of NSVT this morning.  Started on amiodarone yesterday.   General: NAD Neck: JVP 10-12 cm, no thyromegaly or thyroid nodule.  Lungs: Clear to auscultation bilaterally with normal respiratory effort. CV: Lateral PMI.  Heart regular S1/S2, no S3/S4, 2/6 HSM LLSB/apex.  No peripheral edema.  No carotid bruit.  Normal pedal pulses.  Abdomen: Soft, nontender, no hepatosplenomegaly, no distention.  Skin: Intact without lesions or rashes.  Neurologic: Alert and oriented x 3.  Psych: Normal affect. Extremities: No clubbing or cyanosis.  HEENT: Normal.   1. Acute on chronic systolic HF: [place] 05/6294 showed no significant coronary disease. Cardiac MRI 10/17 EF 36%, diffuse HK, normal RV, biatrial enlargement, moderate MR, non-coronary LGE pattern involving the mid-wall of the basal to mid septum and inferior wall. Possibly prior myocarditis versus a form of infiltrative disease. Medtronic CRT-D s/p AV nodal ablation. Echo in 1/21 showed EF <20% with moderate LV dilation and mildly decreased RV systolic function. Low output on 1/21 RHC (CI 1.72). With age and frailty, LVAD not thought to be good option. At last appointment, she was not BiV pacing at an ideal percentage, likely due to PVCs. Symptomatically worse, NYHA class IIIb since decreasing Lasix in setting of rising creatinine. Suspect low output HF.  Echo this admission with EF 20-25%, RV low normal systolic function, severe  MR and TR.  JVD on exam today, suspect ongoing volume overload.  She was short of breath overnight.  - With history of low output HF, will start milrinone 0.125 mcg/kg/min today to facilitate diuresis.  - Lasix 60 mg IV bid today with milrinone.  - RHC arranged for tomorrow morning, will stop milrinone prior to RHC to get cardiac output numbers off milrinone.  - She is off losartan and spironolactone with symptomatic hypotension, will leave off for now.   - Low BP and presyncopal spells, increase midodrine to 5 mg tid.  - Can continue Jardiance 10 mg daily. -Hold bisoprolol as retrying amiodarone as below.  -Off digoxin with persistently elevated level despite cutting back to 0.0625 mg qod. - I will ask EP to see her => paced QRS is relatively wide, I wonder if there is any form of optimization that can be done to narrow the QRS.  - Concerned that frequent PVCs may be affecting function (and also limiting BiV pacing percentage) => Retrying her on amiodarone 200 mg bid.  - She does not qualify for [place] given age, but I would like her eventually evaluated for participation in ANTHEM trial as vagal stimulation may improve her symptoms. 2. Atrial fibrillation: Chronic, s/p AV nodal ablation and BiV pacing.  - Continue Xarelto 15 mg daily (hold for RHC tomorrow).  3. PVCs/VT: Frequent PVCs have limited BiV pacing. Not a good PVC ablation candidate per EP. Had VT in 1/21. Cannot tolerate amiodarone, sotolol, or Tikosyn. Did not tolerate mexilitine with nausea. Had nausea also with ranolazine. Additionally, did not tolerate further uptitration of bisoprolol.  - We will try again to suppress PVCs with amiodarone, start at 200 mg bid.  Will hold bisoprolol since starting amiodarone. Tolerating so far. 4. Mitral regurgitation: I reviewed her echo.  At least 3+ MR, suspect functional due to dilated annulus with severely dilated LA.  TR appears worse (clearly severe).   - I will ask Dr. Burt Knack to  look at echo to review for Mitraclip. However, I think that TR is probably more of a problem.  5. AKI on CKD stage 3: Creatinine up to  1.7 with Lasix IV, suspect cardiorenal.   - Starting milrinone 0.125 to facilitate diuresis as above.   I am also going to engage palliative care service for goals of care discussions.  Home hospice may end up being her best option.   [name] 08/03/2020 10:16 AM

## 2020-08-03 NOTE — Progress Notes (Signed)
Plan for RHC tomorrow at 0830.   Hold xarelto today and restart tomorrow after cath.   Stop milrinone tomorrow at 06:30 prior to cath. Plan for RHC off milrinone.   Briget Shaheed NP-C  12:13 PM

## 2020-08-03 NOTE — Progress Notes (Signed)
  Mobility Specialist Criteria Algorithm Info.  SATURATION QUALIFICATIONS: (This note is used to comply with regulatory documentation for home oxygen)  Patient Saturations on Room Air at Rest = 99%  Patient Saturations on Room Air while Ambulating = 95%  Patient Saturations on 0 Liters of oxygen while Ambulating = n/a%  Please briefly explain why patient needs home oxygen:  Mobility Team:  HOB elevated: Activity: Ambulated in hall;Dangled on edge of bed Range of motion: Active;All extremities Level of assistance: Contact guard assist, steadying assist (Min G) Assistive device: Front wheel walker Minutes sitting in chair:  Minutes stood: 7 minutes Minutes ambulated: 7 minutes Distance ambulated (ft): 120 ft Mobility response: Tolerated fair; RN notified Bed Position: Semi-fowlers  Pt very eager to participate in mobility. Prior to admission pt reports being independent with ADL's and ambulation but more recently has been unable to tolerate walking distances. Has been relying on Rollator more than usual. Explained that she's been having random episodes of hyperventilation and SOB as well as feeling as if she is going to pass out while walking. Ambulated in hallway with Rollator 120 feet at [facility], requiring seated rest breaks x3. Distance limited due to fatigue and nausea. Requires cues for safety as she sits on rollator at random without warning or safely securing device in place with wheel breaks. She also fails to relay information on her symptoms until symptoms are severe. C/o nausea and when asked about dizziness/lightheadness she denied but stated if I wasn't with her than she would have fallen. VSS but unable to ambulate back to room and requested to be pushed back. Nausea subsided after a few minutes of rest. Pt is now lying bed with all needs met and call bell in reach.  BP  Lying: 92/68 Sitting: 88/72 Standing: 99/66    08/03/2020 1:38 PM

## 2020-08-04 ENCOUNTER — Inpatient Hospital Stay (HOSPITAL_COMMUNITY)
Admission: EM | Disposition: A | Discharge status: Home Health | Payer: Self-pay | Source: Home / Self Care | Attending: Cardiology

## 2020-08-04 ENCOUNTER — Inpatient Hospital Stay (HOSPITAL_COMMUNITY): Payer: PPO | Admitting: Anesthesiology

## 2020-08-04 ENCOUNTER — Inpatient Hospital Stay (HOSPITAL_COMMUNITY): Payer: PPO

## 2020-08-04 ENCOUNTER — Encounter (HOSPITAL_COMMUNITY)
Admission: EM | Disposition: A | Discharge status: Home Health | Payer: Self-pay | Source: Home / Self Care | Attending: Cardiology

## 2020-08-04 ENCOUNTER — Encounter (HOSPITAL_COMMUNITY): Payer: Self-pay | Admitting: Cardiology

## 2020-08-04 DIAGNOSIS — R042 Hemoptysis: Secondary | ICD-10-CM | POA: Diagnosis not present

## 2020-08-04 DIAGNOSIS — R0489 Hemorrhage from other sites in respiratory passages: Secondary | ICD-10-CM

## 2020-08-04 DIAGNOSIS — I5023 Acute on chronic systolic (congestive) heart failure: Secondary | ICD-10-CM | POA: Diagnosis not present

## 2020-08-04 DIAGNOSIS — I509 Heart failure, unspecified: Secondary | ICD-10-CM

## 2020-08-04 HISTORY — PX: RIGHT HEART CATH: CATH118263

## 2020-08-04 HISTORY — PX: IR CT SPINE LTD: IMG2387

## 2020-08-04 HISTORY — PX: IR TRANSCATH/EMBOLIZ: IMG695

## 2020-08-04 HISTORY — PX: IR EMBO ART  VEN HEMORR LYMPH EXTRAV  INC GUIDE ROADMAPPING: IMG5450

## 2020-08-04 HISTORY — PX: IR ANGIOGRAM SELECTIVE EACH ADDITIONAL VESSEL: IMG667

## 2020-08-04 HISTORY — PX: RADIOLOGY WITH ANESTHESIA: SHX6223

## 2020-08-04 HISTORY — PX: IR ANGIOGRAM PULMONARY RIGHT SELECTIVE: IMG663

## 2020-08-04 LAB — POCT I-STAT EG7
Acid-Base Excess: 1 mmol/L (ref 0.0–2.0)
Bicarbonate: 26.5 mmol/L (ref 20.0–28.0)
Calcium, Ion: 1.06 mmol/L — ABNORMAL LOW (ref 1.15–1.40)
HCT: 38 % (ref 36.0–46.0)
Hemoglobin: 12.9 g/dL (ref 12.0–15.0)
O2 Saturation: 57 %
Potassium: 3.5 mmol/L (ref 3.5–5.1)
Sodium: 139 mmol/L (ref 135–145)
TCO2: 28 mmol/L (ref 22–32)
pCO2, Ven: 43.9 mmHg — ABNORMAL LOW (ref 44.0–60.0)
pH, Ven: 7.389 (ref 7.250–7.430)
pO2, Ven: 30 mmHg — CL (ref 32.0–45.0)

## 2020-08-04 LAB — CBC
HCT: 36.2 % (ref 36.0–46.0)
HCT: 38.2 % (ref 36.0–46.0)
HCT: 41 % (ref 36.0–46.0)
Hemoglobin: 12.4 g/dL (ref 12.0–15.0)
Hemoglobin: 13 g/dL (ref 12.0–15.0)
Hemoglobin: 14.2 g/dL (ref 12.0–15.0)
MCH: 31.8 pg (ref 26.0–34.0)
MCH: 31.4 pg (ref 26.0–34.0)
MCH: 31.2 pg (ref 26.0–34.0)
MCHC: 34.3 g/dL (ref 30.0–36.0)
MCHC: 34 g/dL (ref 30.0–36.0)
MCHC: 34.6 g/dL (ref 30.0–36.0)
MCV: 92.8 fL (ref 80.0–100.0)
MCV: 92.3 fL (ref 80.0–100.0)
MCV: 90.1 fL (ref 80.0–100.0)
Platelets: 133 10*3/uL — ABNORMAL LOW (ref 150–400)
Platelets: 153 10*3/uL (ref 150–400)
Platelets: 168 10*3/uL (ref 150–400)
RBC: 3.9 MIL/uL (ref 3.87–5.11)
RBC: 4.14 MIL/uL (ref 3.87–5.11)
RBC: 4.55 MIL/uL (ref 3.87–5.11)
RDW: 12.9 % (ref 11.5–15.5)
RDW: 13 % (ref 11.5–15.5)
RDW: 12.9 % (ref 11.5–15.5)
WBC: 5 10*3/uL (ref 4.0–10.5)
WBC: 6 10*3/uL (ref 4.0–10.5)
WBC: 9 10*3/uL (ref 4.0–10.5)
nRBC: 0 % (ref 0.0–0.2)
nRBC: 0 % (ref 0.0–0.2)
nRBC: 0 % (ref 0.0–0.2)

## 2020-08-04 LAB — COOXEMETRY PANEL
Carboxyhemoglobin: 0.8 % (ref 0.5–1.5)
Methemoglobin: 0.7 % (ref 0.0–1.5)
O2 Saturation: 53.4 %
Total hemoglobin: 15.1 g/dL (ref 12.0–16.0)

## 2020-08-04 LAB — POCT I-STAT 7, (LYTES, BLD GAS, ICA,H+H)
Acid-base deficit: 1 mmol/L (ref 0.0–2.0)
Bicarbonate: 24.6 mmol/L (ref 20.0–28.0)
Calcium, Ion: 0.99 mmol/L — ABNORMAL LOW (ref 1.15–1.40)
HCT: 37 % (ref 36.0–46.0)
Hemoglobin: 12.6 g/dL (ref 12.0–15.0)
O2 Saturation: 99 %
Potassium: 3.1 mmol/L — ABNORMAL LOW (ref 3.5–5.1)
Sodium: 141 mmol/L (ref 135–145)
TCO2: 26 mmol/L (ref 22–32)
pCO2 arterial: 42.9 mmHg (ref 32.0–48.0)
pH, Arterial: 7.368 (ref 7.350–7.450)
pO2, Arterial: 160 mmHg — ABNORMAL HIGH (ref 83.0–108.0)

## 2020-08-04 LAB — BASIC METABOLIC PANEL
Anion gap: 14 (ref 5–15)
Anion gap: 12 (ref 5–15)
BUN: 38 mg/dL — ABNORMAL HIGH (ref 8–23)
BUN: 35 mg/dL — ABNORMAL HIGH (ref 8–23)
CO2: 24 mmol/L (ref 22–32)
CO2: 24 mmol/L (ref 22–32)
Calcium: 8.4 mg/dL — ABNORMAL LOW (ref 8.9–10.3)
Calcium: 7.9 mg/dL — ABNORMAL LOW (ref 8.9–10.3)
Chloride: 99 mmol/L (ref 98–111)
Chloride: 100 mmol/L (ref 98–111)
Creatinine, Ser: 1.62 mg/dL — ABNORMAL HIGH (ref 0.44–1.00)
Creatinine, Ser: 1.71 mg/dL — ABNORMAL HIGH (ref 0.44–1.00)
GFR, Estimated: 31 mL/min — ABNORMAL LOW (ref >60)
GFR, Estimated: 29 mL/min — ABNORMAL LOW (ref >60)
Glucose, Bld: 94 mg/dL (ref 70–99)
Glucose, Bld: 144 mg/dL — ABNORMAL HIGH (ref 70–99)
Potassium: 2.9 mmol/L — ABNORMAL LOW (ref 3.5–5.1)
Potassium: 3.4 mmol/L — ABNORMAL LOW (ref 3.5–5.1)
Sodium: 137 mmol/L (ref 135–145)
Sodium: 136 mmol/L (ref 135–145)

## 2020-08-04 LAB — POCT ACTIVATED CLOTTING TIME: Activated Clotting Time: 131 seconds

## 2020-08-04 LAB — PREPARE RBC (CROSSMATCH)

## 2020-08-04 SURGERY — RIGHT HEART CATH
Anesthesia: LOCAL

## 2020-08-04 SURGERY — IR WITH ANESTHESIA
Anesthesia: General

## 2020-08-04 MED ORDER — CHLORHEXIDINE GLUCONATE CLOTH 2 % EX PADS
6.0000 | MEDICATED_PAD | Freq: Every day | CUTANEOUS | Status: DC
  Administered 2020-08-04 – 2020-08-18 (×13): 6 via TOPICAL

## 2020-08-04 MED ORDER — FENTANYL CITRATE (PF) 100 MCG/2ML IJ SOLN: 25.0000 ug | INTRAMUSCULAR | Status: DC | PRN

## 2020-08-04 MED ORDER — SODIUM CHLORIDE 0.9 % IV SOLN: INTRAVENOUS | Status: DC | PRN

## 2020-08-04 MED ORDER — AMIODARONE HCL 150 MG/3ML IV SOLN
INTRAVENOUS | Status: AC
  Filled 2020-08-04: qty 3

## 2020-08-04 MED ORDER — LIDOCAINE HCL (PF) 1 % IJ SOLN
INTRAMUSCULAR | Status: AC
  Filled 2020-08-04: qty 30

## 2020-08-04 MED ORDER — POTASSIUM CHLORIDE 20 MEQ PO PACK
20.0000 meq | PACK | Freq: Once | ORAL | Status: AC
  Administered 2020-08-04: 20 meq
  Filled 2020-08-04: qty 1

## 2020-08-04 MED ORDER — DOCUSATE SODIUM 50 MG/5ML PO LIQD
100.0000 mg | Freq: Two times a day (BID) | ORAL | Status: DC
  Administered 2020-08-04 – 2020-08-05 (×2): 100 mg
  Filled 2020-08-04 (×3): qty 10

## 2020-08-04 MED ORDER — FENTANYL 2500MCG IN NS 250ML (10MCG/ML) PREMIX INFUSION
INTRAVENOUS | Status: AC
  Filled 2020-08-04: qty 250

## 2020-08-04 MED ORDER — FENTANYL BOLUS VIA INFUSION
25.0000 ug | INTRAVENOUS | Status: DC | PRN
  Filled 2020-08-04: qty 25

## 2020-08-04 MED ORDER — CHLORHEXIDINE GLUCONATE 0.12% ORAL RINSE (MEDLINE KIT)
15.0000 mL | Freq: Two times a day (BID) | OROMUCOSAL | Status: DC
  Administered 2020-08-04 – 2020-08-05 (×2): 15 mL via OROMUCOSAL

## 2020-08-04 MED ORDER — HEPARIN (PORCINE) IN NACL 1000-0.9 UT/500ML-% IV SOLN
INTRAVENOUS | Status: AC
  Filled 2020-08-04: qty 1000

## 2020-08-04 MED ORDER — IOHEXOL 300 MG/ML  SOLN
150.0000 mL | Freq: Once | INTRAMUSCULAR | Status: AC | PRN
  Administered 2020-08-04: 65 mL via INTRA_ARTERIAL

## 2020-08-04 MED ORDER — MIDAZOLAM HCL 2 MG/2ML IJ SOLN
INTRAMUSCULAR | Status: DC | PRN
  Administered 2020-08-04: 2 mg via INTRAVENOUS

## 2020-08-04 MED ORDER — AMIODARONE LOAD VIA INFUSION
INTRAVENOUS | Status: DC | PRN
  Administered 2020-08-04 (×2): 150 mg via INTRAVENOUS

## 2020-08-04 MED ORDER — SODIUM CHLORIDE 0.9 % IV SOLN: INTRAVENOUS | Status: AC

## 2020-08-04 MED ORDER — FENTANYL BOLUS VIA INFUSION
INTRAVENOUS | Status: DC | PRN
Start: 2020-08-04 — End: 2020-08-04

## 2020-08-04 MED ORDER — MIDODRINE HCL 5 MG PO TABS
5.0000 mg | ORAL_TABLET | Freq: Three times a day (TID) | ORAL | Status: DC
  Administered 2020-08-04 – 2020-08-05 (×3): 5 mg
  Filled 2020-08-04 (×3): qty 1

## 2020-08-04 MED ORDER — FENTANYL CITRATE (PF) 100 MCG/2ML IJ SOLN
INTRAMUSCULAR | Status: DC | PRN
  Administered 2020-08-04: 50 ug via INTRAVENOUS

## 2020-08-04 MED ORDER — SODIUM CHLORIDE 0.9% IV SOLUTION: Freq: Once | INTRAVENOUS | Status: DC

## 2020-08-04 MED ORDER — FENTANYL 2500MCG IN NS 250ML (10MCG/ML) PREMIX INFUSION
25.0000 ug/h | INTRAVENOUS | Status: DC
  Administered 2020-08-04: 50 ug/h via INTRAVENOUS

## 2020-08-04 MED ORDER — TRANEXAMIC ACID-NACL 1000-0.7 MG/100ML-% IV SOLN
1000.0000 mg | INTRAVENOUS | Status: AC
  Filled 2020-08-04: qty 100

## 2020-08-04 MED ORDER — AMIODARONE HCL IN DEXTROSE 360-4.14 MG/200ML-% IV SOLN
INTRAVENOUS | Status: AC
  Filled 2020-08-04: qty 200

## 2020-08-04 MED ORDER — MILRINONE LACTATE IN DEXTROSE 20-5 MG/100ML-% IV SOLN: 0.1250 ug/kg/min | INTRAVENOUS | Status: DC

## 2020-08-04 MED ORDER — IOHEXOL 300 MG/ML  SOLN
100.0000 mL | Freq: Once | INTRAMUSCULAR | Status: AC | PRN
  Administered 2020-08-04: 65 mL via INTRA_ARTERIAL

## 2020-08-04 MED ORDER — POTASSIUM CHLORIDE CRYS ER 20 MEQ PO TBCR
40.0000 meq | EXTENDED_RELEASE_TABLET | Freq: Once | ORAL | Status: AC
  Administered 2020-08-04: 40 meq via ORAL
  Filled 2020-08-04: qty 2

## 2020-08-04 MED ORDER — EPINEPHRINE HCL 5 MG/250ML IV SOLN IN NS
0.5000 ug/min | INTRAVENOUS | Status: DC
  Filled 2020-08-04: qty 250

## 2020-08-04 MED ORDER — ROCURONIUM BROMIDE 50 MG/5ML IV SOLN
INTRAVENOUS | Status: DC | PRN
  Administered 2020-08-04: 30 mg via INTRAVENOUS

## 2020-08-04 MED ORDER — LACTATED RINGERS IV SOLN: INTRAVENOUS | Status: DC | PRN

## 2020-08-04 MED ORDER — PANTOPRAZOLE SODIUM 40 MG PO PACK
40.0000 mg | PACK | Freq: Every day | ORAL | Status: DC
  Administered 2020-08-05: 40 mg
  Filled 2020-08-04 (×2): qty 20

## 2020-08-04 MED ORDER — AMIODARONE HCL IN DEXTROSE 360-4.14 MG/200ML-% IV SOLN
30.0000 mg/h | INTRAVENOUS | Status: DC
  Administered 2020-08-04 – 2020-08-06 (×4): 30 mg/h via INTRAVENOUS
  Filled 2020-08-04 (×4): qty 200

## 2020-08-04 MED ORDER — NOREPINEPHRINE 4 MG/250ML-% IV SOLN
INTRAVENOUS | Status: AC
  Filled 2020-08-04: qty 250

## 2020-08-04 MED ORDER — MAGNESIUM SULFATE 50 % IJ SOLN
INTRAMUSCULAR | Status: AC
  Filled 2020-08-04: qty 2

## 2020-08-04 MED ORDER — LIDOCAINE HCL (PF) 1 % IJ SOLN
INTRAMUSCULAR | Status: DC | PRN
  Administered 2020-08-04: 5 mL

## 2020-08-04 MED ORDER — FENTANYL CITRATE (PF) 100 MCG/2ML IJ SOLN
INTRAMUSCULAR | Status: AC
  Filled 2020-08-04: qty 2

## 2020-08-04 MED ORDER — ONDANSETRON HCL 4 MG/2ML IJ SOLN: INTRAMUSCULAR | Status: DC | PRN

## 2020-08-04 MED ORDER — ETOMIDATE 2 MG/ML IV SOLN
INTRAVENOUS | Status: AC
  Filled 2020-08-04: qty 20

## 2020-08-04 MED ORDER — MIDAZOLAM HCL 2 MG/2ML IJ SOLN
INTRAMUSCULAR | Status: AC
  Filled 2020-08-04: qty 2

## 2020-08-04 MED ORDER — ZOLPIDEM TARTRATE 5 MG PO TABS: 5.0000 mg | ORAL_TABLET | Freq: Every evening | ORAL | Status: DC | PRN

## 2020-08-04 MED ORDER — SODIUM CHLORIDE 0.9% FLUSH: 10.0000 mL | INTRAVENOUS | Status: DC | PRN

## 2020-08-04 MED ORDER — ARTIFICIAL TEARS OPHTHALMIC OINT
1.0000 "application " | TOPICAL_OINTMENT | Freq: Three times a day (TID) | OPHTHALMIC | Status: DC
  Administered 2020-08-04 – 2020-08-05 (×3): 1 via OPHTHALMIC
  Filled 2020-08-04 (×2): qty 3.5

## 2020-08-04 MED ORDER — MIDAZOLAM HCL 2 MG/2ML IJ SOLN
INTRAMUSCULAR | Status: AC
  Filled 2020-08-04: qty 4

## 2020-08-04 MED ORDER — LORAZEPAM 0.5 MG PO TABS: 0.5000 mg | ORAL_TABLET | Freq: Two times a day (BID) | ORAL | Status: DC | PRN

## 2020-08-04 MED ORDER — PROPOFOL 1000 MG/100ML IV EMUL
25.0000 ug/kg/min | INTRAVENOUS | Status: DC
  Administered 2020-08-04 (×2): 25 ug/kg/min via INTRAVENOUS
  Administered 2020-08-05: 25.029 ug/kg/min via INTRAVENOUS
  Filled 2020-08-04 (×3): qty 100

## 2020-08-04 MED ORDER — NOREPINEPHRINE 4 MG/250ML-% IV SOLN
0.0000 ug/min | INTRAVENOUS | Status: DC
  Administered 2020-08-04: 6 ug/min via INTRAVENOUS
  Administered 2020-08-04: 3 ug/min via INTRAVENOUS
  Administered 2020-08-05: 1 ug/min via INTRAVENOUS
  Administered 2020-08-05: 2 ug/min via INTRAVENOUS
  Administered 2020-08-05: 4 ug/min via INTRAVENOUS
  Filled 2020-08-04 (×2): qty 250

## 2020-08-04 MED ORDER — AMIODARONE HCL IN DEXTROSE 360-4.14 MG/200ML-% IV SOLN
INTRAVENOUS | Status: AC | PRN
  Administered 2020-08-04: 60 mg/h via INTRAVENOUS

## 2020-08-04 MED ORDER — MAGNESIUM SULFATE 50 % IJ SOLN
INTRAMUSCULAR | Status: DC | PRN
  Administered 2020-08-04: 2 g via INTRAVENOUS

## 2020-08-04 MED ORDER — ACETAMINOPHEN 325 MG PO TABS: 650.0000 mg | ORAL_TABLET | ORAL | Status: DC | PRN

## 2020-08-04 MED ORDER — POTASSIUM CHLORIDE CRYS ER 20 MEQ PO TBCR: 40.0000 meq | EXTENDED_RELEASE_TABLET | Freq: Every day | ORAL | Status: DC

## 2020-08-04 MED ORDER — PHENYLEPHRINE 40 MCG/ML (10ML) SYRINGE FOR IV PUSH (FOR BLOOD PRESSURE SUPPORT)
PREFILLED_SYRINGE | INTRAVENOUS | Status: DC | PRN
  Administered 2020-08-04: 80 ug via INTRAVENOUS

## 2020-08-04 MED ORDER — ROCURONIUM BROMIDE 50 MG/5ML IV SOLN
0.6000 mg/kg | INTRAVENOUS | Status: DC | PRN
  Filled 2020-08-04: qty 3.4

## 2020-08-04 MED ORDER — IOHEXOL 350 MG/ML SOLN
INTRAVENOUS | Status: DC | PRN
  Administered 2020-08-04: 20 mL

## 2020-08-04 MED ORDER — POLYETHYLENE GLYCOL 3350 17 G PO PACK
17.0000 g | PACK | Freq: Every day | ORAL | Status: DC
  Administered 2020-08-05: 17 g
  Filled 2020-08-04 (×2): qty 1

## 2020-08-04 MED ORDER — ETOMIDATE 2 MG/ML IV SOLN
INTRAVENOUS | Status: DC | PRN
  Administered 2020-08-04: 10 mg via INTRAVENOUS

## 2020-08-04 MED ORDER — NOREPINEPHRINE BITARTRATE 1 MG/ML IV SOLN
INTRAVENOUS | Status: AC | PRN
  Administered 2020-08-04: 10 ug/min via INTRAVENOUS

## 2020-08-04 MED ORDER — MIDAZOLAM HCL 2 MG/2ML IJ SOLN: 1.0000 mg | INTRAMUSCULAR | Status: DC | PRN

## 2020-08-04 MED ORDER — FENTANYL CITRATE (PF) 100 MCG/2ML IJ SOLN: 25.0000 ug | Freq: Once | INTRAMUSCULAR | Status: DC

## 2020-08-04 MED ORDER — ROCURONIUM BROMIDE 50 MG/5ML IV SOLN
INTRAVENOUS | Status: DC | PRN
  Administered 2020-08-04: 50 mg via INTRAVENOUS

## 2020-08-04 MED ORDER — MAGNESIUM SULFATE 50 % IJ SOLN: INTRAVENOUS | Status: DC | PRN

## 2020-08-04 MED ORDER — ORAL CARE MOUTH RINSE
15.0000 mL | OROMUCOSAL | Status: DC
  Administered 2020-08-04 – 2020-08-05 (×6): 15 mL via OROMUCOSAL

## 2020-08-04 MED ORDER — ROCURONIUM BROMIDE 10 MG/ML (PF) SYRINGE
PREFILLED_SYRINGE | INTRAVENOUS | Status: AC
  Filled 2020-08-04: qty 10

## 2020-08-04 SURGICAL SUPPLY — 15 items
CATH SWAN GANZ 7F STRAIGHT (CATHETERS) ×2 IMPLANT
DEVICE RAD COMP TR BAND LRG (VASCULAR PRODUCTS) ×1 IMPLANT
GLIDESHEATH SLEND SS 6F .021 (SHEATH) ×1 IMPLANT
GLIDESHEATH SLENDER 7FR .021G (SHEATH) ×1 IMPLANT
GUIDEWIRE .025 260CM (WIRE) ×2 IMPLANT
KIT HEART LEFT (KITS) ×2 IMPLANT
KIT HEMODIALYSIS 14X20 ARROW (MISCELLANEOUS) ×1 IMPLANT
KIT MICROPUNCTURE NIT STIFF (SHEATH) ×1 IMPLANT
PACK CARDIAC CATHETERIZATION (CUSTOM PROCEDURE TRAY) ×2 IMPLANT
PROTECTION STATION PRESSURIZED (MISCELLANEOUS) ×2
SHEATH PINNACLE 7F 10CM (SHEATH) ×1 IMPLANT
SHEATH SET SUPER ARROWFLEX 8FR (SHEATH) ×1 IMPLANT
STATION PROTECTION PRESSURIZED (MISCELLANEOUS) IMPLANT
TRANSDUCER W/STOPCOCK (MISCELLANEOUS) ×3 IMPLANT
WIRE EMERALD 3MM-J .025X260CM (WIRE) ×1 IMPLANT

## 2020-08-04 NOTE — Procedures (Signed)
Intubation Procedure Note  TORIA MONTE  [id number]  Jul 26, 1935  Date:08/04/20  Time:9:19 AM   Provider Performing:Zaelyn Barbary    Procedure: Intubation (16010)  Indication(s) Respiratory Failure  Consent Unable to obtain consent due to emergent nature of procedure.   Anesthesia Etomidate, Versed, Fentanyl and Rocuronium   Time Out Verified patient identification, verified procedure, site/side was marked, verified correct patient position, special equipment/implants available, medications/allergies/relevant history reviewed, required imaging and test results available.   Sterile Technique Usual hand hygeine, masks, and gloves were used   Procedure Description Patient positioned in bed supine.  Sedation given as noted above.  Patient was intubated with endotracheal tube using MAC 3 direct laryngoscopy.  View was Grade 1 full glottis .  Number of attempts was 1.  Colorimetric CO2 detector was consistent with tracheal placement.  Large amount of blood in posterior oropharynx including overlying glottic opening.   Tube inserted to 22 cm at the teeth.  Complications/Tolerance None; patient tolerated the procedure well. Chest X-ray is ordered to verify placement.  Tube position verified bronchoscopically.   EBL Minimal  Kipp Brood, MD [facility] ICU Physician [place]  Pager: [phone] Or Epic Secure Chat After hours: [phone].  08/04/2020, 9:21 AM

## 2020-08-04 NOTE — Progress Notes (Signed)
Called to cath lab for intubated patient.  Patient placed on vent with settings PRVC RR 16, VT 410, Peep 5, FiO2 100%.  Patient transferred to IR when finished in the cath lab.

## 2020-08-04 NOTE — Progress Notes (Signed)
RT transported patient from IR to [place] with RN. No complications. RT will continue to monitor.

## 2020-08-04 NOTE — Procedures (Addendum)
  Procedure: R pulmonary arteriogram,  RLL anterior basal segmental pulm art subselective  embolization   EBL:   minimal Complications:  none immediate  See full dictation in [facility].  Dillard Cannon MD Main # [phone] Pager  [phone] Mobile [phone]

## 2020-08-04 NOTE — Transfer of Care (Signed)
Immediate Anesthesia Transfer of Care Note  Patient: Cheryl Vargas  Procedure(s) Performed: IR WITH ANESTHESIA (N/A )  Patient Location: ICU  Anesthesia Type:General  Level of Consciousness: Patient remains intubated per anesthesia plan  Airway & Oxygen Therapy: Patient remains intubated per anesthesia plan and Patient placed on Ventilator (see vital sign flow sheet for setting)  Post-op Assessment: Report given to RN and Post -op Vital signs reviewed and stable  Post vital signs: Reviewed and stable  Last Vitals:  Vitals Value Taken Time  BP 125/63 08/04/20 13:47  Temp    Pulse 86 08/04/20 13:47  Resp 22 08/04/20 13:47  SpO2 97 08/04/20 13:47    Last Pain:  Vitals:   08/04/20 0914  TempSrc:   PainSc: 0-No pain      Patients Stated Pain Goal: 1 (66/06/00 4599)  Complications: No complications documented.

## 2020-08-04 NOTE — Anesthesia Preprocedure Evaluation (Addendum)
Anesthesia Evaluation  Patient identified by MRN, date of birth, ID band Patient unresponsive    Reviewed: Allergy & Precautions, H&P , NPO status , Patient's Chart, lab work & pertinent test results, reviewed documented beta blocker date and time Preop documentation limited or incomplete due to emergent nature of procedure.  Airway Mallampati: Intubated       Dental no notable dental hx. (+) Dental Advisory Given   Pulmonary shortness of breath,  Intubated on vent   Pulmonary exam normal breath sounds clear to auscultation       Cardiovascular hypertension, Pt. on medications and Pt. on home beta blockers +CHF  + dysrhythmias Atrial Fibrillation + Valvular Problems/Murmurs MR  Rhythm:Irregular Rate:Normal     Neuro/Psych negative neurological ROS  negative psych ROS   GI/Hepatic negative GI ROS, Neg liver ROS,   Endo/Other  negative endocrine ROS  Renal/GU negative Renal ROS  negative genitourinary   Musculoskeletal  (+) Arthritis , Osteoarthritis,    Abdominal   Peds  Hematology negative hematology ROS (+)   Anesthesia Other Findings   Reproductive/Obstetrics negative OB ROS                           Anesthesia Physical Anesthesia Plan  ASA: IV and emergent  Anesthesia Plan: General   Post-op Pain Management:    Induction: Intravenous  PONV Risk Score and Plan: 3 and Treatment may vary due to age or medical condition  Airway Management Planned: Oral ETT  Additional Equipment:   Intra-op Plan:   Post-operative Plan: Post-operative intubation/ventilation  Informed Consent: I have reviewed the patients History and Physical, chart, labs and discussed the procedure including the risks, benefits and alternatives for the proposed anesthesia with the patient or authorized representative who has indicated his/her understanding and acceptance.     Dental advisory given  Plan  Discussed with: CRNA  Anesthesia Plan Comments:        Anesthesia Quick Evaluation

## 2020-08-04 NOTE — Anesthesia Postprocedure Evaluation (Signed)
Anesthesia Post Note  Patient: Cheryl Vargas  Procedure(s) Performed: IR WITH ANESTHESIA (N/A )     Patient location during evaluation: SICU Anesthesia Type: General Level of consciousness: sedated Pain management: pain level controlled Vital Signs Assessment: post-procedure vital signs reviewed and stable Respiratory status: patient remains intubated per anesthesia plan Cardiovascular status: stable Postop Assessment: no apparent nausea or vomiting Anesthetic complications: no   No complications documented.  Last Vitals:  Vitals:   08/04/20 1135 08/04/20 1459  BP:    Pulse: (!) 0   Resp: (!) 0   Temp:    SpO2: (!) 0% 100%    Last Pain:  Vitals:   08/04/20 0914  TempSrc:   PainSc: 0-No pain                 Trapper Meech,W. EDMOND

## 2020-08-04 NOTE — Procedures (Signed)
Bronchoscopy Procedure Note  Cheryl Vargas  [id number]  10-Sep-1935  Date:08/04/20  Time:9:22 AM   Provider Performing:Ceil Roderick   Procedure(s):  Flexible Bronchoscopy (29528)  Indication(s) Hemoptysis  Consent Unable to obtain consent due to emergent nature of procedure.  Anesthesia Midazolam, fentanyl, rocuronium, etomidate.   Time Out Verified patient identification, verified procedure, site/side was marked, verified correct patient position, special equipment/implants available, medications/allergies/relevant history reviewed, required imaging and test results available.   Sterile Technique Usual hand hygiene, masks, gowns, and gloves were used   Procedure Description Bronchoscope advanced through endotracheal tube and into airway.  Airways were examined down to subsegmental level with findings noted below.    Findings: Diffuse blood throughout trachea and both lungs.  Suctioning active bleeding only seen in right middle lobe as a slow ooze.  No clots seen, no endobronchial lesions seen.  Complications/Tolerance None; patient tolerated the procedure well. Chest X-ray is needed post procedure.   EBL Minimal   Specimen(s) None.  Kipp Brood, MD [facility] ICU Physician [place]  Pager: [phone] Or Epic Secure Chat After hours: [phone].  08/04/2020, 9:25 AM

## 2020-08-04 NOTE — Progress Notes (Signed)
256ml dark old blood suctioned from stomach. MD aware

## 2020-08-04 NOTE — Progress Notes (Signed)
Patient taken in her bed via Surgical transport team for heart cath. She left alert and oriented times 4 with no c/o's. She called her son to let him know she was going for heart cath. Her Milirone drip was suspended per request of MD. No further changes noted

## 2020-08-04 NOTE — Progress Notes (Signed)
NAME:  Cheryl Vargas, MRN:  [id number], DOB:  1934/10/07, LOS: 2 ADMISSION DATE:  08/02/2020, CONSULTATION DATE:  08/04/2020 REFERRING MD:  [place], CHIEF COMPLAINT:  Hemoptysis.   HPI/course in hospital  85 year old woman who experienced sudden massive hemoptysis following balloon inflation during pulmonary artery catheterization.  Known nonischemic cardiomyopathy with ejection fraction less than 20%, followed closely in heart failure clinic.  Ongoing episodes of presyncope with associated shortness of breath occurring with exertion.  Weight has been stable to decreasing.  Creatinine has increased.  Admitted to hospital and diuresed for possible volume overload. Underwent right heart catheterization for hemodynamic evaluation today.  Filling pressures did not appear particularly elevated. Unfortunately, patient developed gross hemoptysis immediately following balloon inflation.  Past Medical History   Past Medical History:  Diagnosis Date  . Arthritis   . Chronic combined systolic (congestive) and diastolic (congestive) heart failure ([place])   . Mitral regurgitation   . Non-ischemic cardiomyopathy ([place])   . Persistent atrial fibrillation ([place])   . Ventricular tachycardia [facility])      Past Surgical History:  Procedure Laterality Date  . AV NODE ABLATION N/A 09/07/2017   Procedure: AV NODE ABLATION;  Surgeon: [name], MD;  Location: [place] CV LAB;  Service: Cardiovascular;  Laterality: N/A;  . BIV ICD INSERTION CRT-D N/A 09/07/2017   Procedure: BIV ICD INSERTION CRT-D;  Surgeon: [name], MD;  Location: [place] CV LAB;  Service: Cardiovascular;  Laterality: N/A;  . BUNIONECTOMY    . CARDIAC CATHETERIZATION     in 2004 at [facility]. "Insignificant blockage" per patient  . CARDIAC CATHETERIZATION N/A 04/25/2016   Procedure: Left Heart Cath and Coronary Angiography;  Surgeon: Nelva Bush, MD;  Location: [place] CV LAB;  Service:  Cardiovascular;  Laterality: N/A;  . CARDIOVERSION N/A 06/29/2017   Procedure: CARDIOVERSION;  Surgeon: Larey Dresser, MD;  Location: [facility] ENDOSCOPY;  Service: Cardiovascular;  Laterality: N/A;  . CARDIOVERSION N/A 08/28/2017   Procedure: CARDIOVERSION;  Surgeon: Larey Dresser, MD;  Location: [facility] ENDOSCOPY;  Service: Cardiovascular;  Laterality: N/A;  . RIGHT HEART CATH N/A 09/29/2019   Procedure: RIGHT HEART CATH;  Surgeon: Larey Dresser, MD;  Location: [place] CV LAB;  Service: Cardiovascular;  Laterality: N/A;  . SHOULDER SURGERY     closed reduction  . TEE WITHOUT CARDIOVERSION N/A 06/29/2017   Procedure: TRANSESOPHAGEAL ECHOCARDIOGRAM (TEE);  Surgeon: Larey Dresser, MD;  Location: [facility] ENDOSCOPY;  Service: Cardiovascular;  Laterality: N/A;  . TONSILLECTOMY    . TOTAL HIP ARTHROPLASTY Left 03/18/2013   Procedure: TOTAL HIP ARTHROPLASTY ANTERIOR APPROACH;  Surgeon: Mauri Pole, MD;  Location: [facility] ORS;  Service: Orthopedics;  Laterality: Left;  . TUBAL LIGATION        Interim history/subjective:  Patient was initially awake coughing up bright red blood complaining of difficulty breathing.  I emergently intubated her without difficulty.  Hemoptysis seemed to stop following this.  Minimal active bleeding from the middle lobe seen on bronchoscopy.  Objective   Blood pressure 94/60, pulse 79, temperature 98.7 F (37.1 C), temperature source Oral, resp. rate 17, height 5\' 5"  (1.651 m), weight 56.6 kg, SpO2 94 %.        Intake/Output Summary (Last 24 hours) at 08/04/2020 0926 Last data filed at 08/04/2020 0434 Gross per 24 hour  Intake 697.84 ml  Output 2300 ml  Net -1602.16 ml   Filed Weights   08/02/20 0927 08/03/20 0400  Weight: 57 kg  56.6 kg    Examination: Physical Exam Constitutional:      Appearance: She is cachectic.     Interventions: She is sedated, chemically paralyzed and intubated.  HENT:     Head: Normocephalic and atraumatic.     Comments: 7 mm  endotracheal tube at 22 cm. Cardiovascular:     Rate and Rhythm: Regular rhythm.     Heart sounds: Normal heart sounds.  Pulmonary:     Effort: She is intubated.     Breath sounds: Rhonchi present.  Abdominal:     Palpations: Abdomen is soft.  Musculoskeletal:     Right lower leg: No edema.     Left lower leg: No edema.  Skin:    General: Skin is warm and dry.  Neurological:     Mental Status: She is unresponsive.     GCS: GCS eye subscore is 1. GCS verbal subscore is 1. GCS motor subscore is 1.      Ancillary tests (personally reviewed)  CBC: Recent Labs  Lab 08/02/20 0832  WBC 6.5  HGB 15.1*  HCT 45.4  MCV 93.2  PLT 627    Basic Metabolic Panel: Recent Labs  Lab 07/28/20 0949 08/02/20 0832 08/03/20 0248 08/04/20 0257  NA 137 138 136 137  K 3.3* 3.4* 3.8 2.9*  CL 99 100 100 99  CO2 27 26 22 24   GLUCOSE 135* 121* 121* 94  BUN 31* 38* 42* 38*  CREATININE 1.78* 1.52* 1.74* 1.62*  CALCIUM 9.0 9.1 8.6* 8.4*   GFR: Estimated Creatinine Clearance: 23.5 mL/min (A) (by C-G formula based on SCr of 1.62 mg/dL (H)). Recent Labs  Lab 08/02/20 0832 08/02/20 0922 08/02/20 1122  WBC 6.5  --   --   LATICACIDVEN  --  1.8 2.1*    Liver Function Tests: No results for input(s): AST, ALT, ALKPHOS, BILITOT, PROT, ALBUMIN in the last 168 hours. No results for input(s): LIPASE, AMYLASE in the last 168 hours. No results for input(s): AMMONIA in the last 168 hours.  ABG    Component Value Date/Time   PHART 7.302 (L) 09/03/2017 1754   PCO2ART 27.8 (L) 09/03/2017 1754   PO2ART 49.0 (L) 09/03/2017 1754   HCO3 26.8 09/29/2019 0822   HCO3 26.7 09/29/2019 0822   TCO2 28 09/29/2019 0822   TCO2 28 09/29/2019 0822   ACIDBASEDEF 11.0 (H) 09/03/2017 1754   O2SAT 60.0 09/29/2019 0822   O2SAT 59.0 09/29/2019 0822     Coagulation Profile: No results for input(s): INR, PROTIME in the last 168 hours.  Cardiac Enzymes: No results for input(s): CKTOTAL, CKMB, CKMBINDEX,  TROPONINI in the last 168 hours.  HbA1C: Hgb A1c MFr Bld  Date/Time Value Ref Range Status  12/17/2017 03:45 AM 5.6 4.8 - 5.6 % Final    Comment:    (NOTE) Pre diabetes:          5.7%-6.4% Diabetes:              >6.4% Glycemic control for   <7.0% adults with diabetes     CBG: No results for input(s): GLUCAP in the last 168 hours.   Assessment & Plan:  Critically ill due to acute hypoxic respiratory failure requiring mechanical ventilation secondary to massive atelectasis from likely pulmonary artery branch rupture. Nonischemic cardiomyopathy Cardiac cachexia Status post biventricular pacemaker Acute decompensated systolic heart failure Acute on chronic kidney disease stage III.  Plan: -Full ventilatory support -Attempt to balloon occlude bleeding vessel. -Keep sedated to avoid coughing changes intrathoracic pressure. -  Hold diuresis at this time. -Vasopressor support as necessary to keep systolic blood pressure 47-841.  Avoid hypotension. - If no further evidence of bleeding tomorrow, we will do a quick airway inspection and if still no active bleeding we will wean sedation and plan to extubate.  Daily Goals Checklist  Pain/Anxiety/Delirium protocol (if indicated): Fentanyl infusion, propofol infusion, RASS -4 VAP protocol (if indicated): Bundle in place Respiratory support goals: Full ventilatory support.  No weaning until hemostasis confirmed Blood pressure target: Keep systolic blood pressure 28-208. Continue milrinone for CI support.  DVT prophylaxis: Mechanical prophylaxis only Nutritional status and feeding goals: Hold feeds for now GI prophylaxis: Protonix Fluid status goals: Per heart failure Urinary catheter: Assessment of intravascular volume Central lines: Right arm sheath Glucose control: Phase 1 glycemic protocol Mobility/therapy needs: Bedrest Antibiotic de-escalation: No antibiotics Home medication reconciliation: On hold Daily labs: CBC, BMP  daily. Code Status: Full code, palliative care following Family Communication: We will update family after procedure Disposition: ICU  Critical care time: 40 min.    Kipp Brood, MD [facility] ICU Physician [place]  Pager: [phone] Or Epic Secure Chat After hours: [phone].  08/04/2020, 9:26 AM

## 2020-08-04 NOTE — Procedures (Signed)
Arterial Catheter Insertion Procedure Note  Cheryl Vargas  [id number]  1935/07/08  Date:08/04/20  Time:2:28 PM    Provider Performing: Herbie Saxon Colin Ellers    Procedure: Insertion of Arterial Line [phone])  Indication(s) Blood pressure monitoring and/or need for frequent ABGs  Consent Unable to obtain consent due to emergent nature of procedure.  Anesthesia None   Time Out Verified patient identification, verified procedure, site/side was marked, verified correct patient position, special equipment/implants available, medications/allergies/relevant history reviewed, required imaging and test results available.   Sterile Technique Maximal sterile technique including full sterile barrier drape, hand hygiene, sterile gown, sterile gloves, mask, hair covering, sterile ultrasound probe cover (if used).   Procedure Description Area of catheter insertion was cleaned with chlorhexidine and draped in sterile fashion. With real-time ultrasound guidance an arterial catheter was placed into the right radial artery.  Appropriate arterial tracings confirmed on monitor.     Complications/Tolerance None; patient tolerated the procedure well.   EBL Minimal   Specimen(s) None

## 2020-08-04 NOTE — CV Procedure (Signed)
Central Venous Catheter Insertion Procedure Note Cheryl Vargas [id number] 11/30/1934    Procedure: Insertion of Central Venous Catheter Indications: Drug and/or fluid administration   Procedure Details Consent: Risks of procedure as well as the alternatives and risks of each were explained to the (patient/caregiver).  Consent for procedure obtained. Time Out: Verified patient identification, verified procedure, site/side was marked, verified correct patient position, special equipment/implants available, medications/allergies/relevent history reviewed, required imaging and test results available.  Performed   Maximum sterile technique was used including antiseptics, cap, gloves, gown, hand hygiene, mask and sheet. Skin prep: Chlorhexidine; local anesthetic administered A antimicrobial bonded/coated triple lumen catheter was placed in the right internal jugular vein using a micropuncture kit, the Seldinger technique and u/s guidance.   Evaluation Blood flow good Complications: No apparent complications Patient did tolerate procedure well. Chest X-ray ordered to verify placement. placement ok on fluoro   Glori Bickers, MD  4:35 PM

## 2020-08-04 NOTE — Progress Notes (Signed)
Patient ID: Cheryl Vargas, female   DOB: Apr 29, 1935, 85 y.o.   MRN: [id number]       Advanced Heart Failure Rounding Note  PCP-Cardiologist: [name], MD   Subjective:    Yesterday diuresed with IV lasix and started on milrinone 0.125.  Good diuresis, creatinine down to 1.62.  Milrinone stopped this morning for RHC to obtain numbers off milrinone.   Patient went for RHC today.  See procedure note for RHC.  After PCWP obtained, patient developed frank hemoptysis. Suspected rupture of a branch of the right PA.  Patient was intubated by CCM promptly and underwent bronchoscopy to clear blood.  She then underwent pulmonary angiography, but unable to wire the relatively small bleeding branch (had hoped to enter branch and tamponade with balloon. CVL and arterial line placed by Dr. Haroldine Laws while I performed the angiogram.  Patient was then taken to IR in stable condition with oxygen saturation in the upper 90s and SBP 120s on norepinephrine 2 for embolization versus covered stent.   She had runs of VT when oxygen saturation dropped, shocked x 1 and started on amiodarone gtt with quiescence of VT.   - ECHO EF 20-25% RV normal, biatrial enlargement, IVC normal.  - RHC Procedural Findings (incomplete study due to emergency): Hemodynamics (mmHg) RA mean 6 PA 41/16, mean 26 PCWP mean 19 Oxygen saturations: PA 57% AO 93% Cardiac Output (Fick) 2.92  Cardiac Index (Fick) 1.8    Objective:   Weight Range: 56.6 kg Body mass index is 20.76 kg/m.   Vital Signs:   Temp:  [97.6 F (36.4 C)-98.7 F (37.1 C)] 98.7 F (37.1 C) (11/24 0427) Pulse Rate:  [0-172] 0 (11/24 1135) Resp:  [0-24] 0 (11/24 1135) BP: (57-177)/(33-153) 94/54 (11/24 1101) SpO2:  [0 %-100 %] 0 % (11/24 1135) Last BM Date: 08/02/20  Weight change: Filed Weights   08/02/20 0927 08/03/20 0400  Weight: 57 kg 56.6 kg    Intake/Output:   Intake/Output Summary (Last 24 hours) at 08/04/2020 1235 Last data filed at  08/04/2020 0434 Gross per 24 hour  Intake 577.84 ml  Output 1850 ml  Net -1272.16 ml      Physical Exam    General: Intubated/sedated.  Neck: No JVD, no thyromegaly or thyroid nodule.  Lungs: Clear to auscultation bilaterally with normal respiratory effort. CV: Nondisplaced PMI.  Heart regular S1/S2, no S3/S4, 2/6 HSM LLSB.  No peripheral edema.   Abdomen: Soft, nontender, no hepatosplenomegaly, no distention.  Skin: Intact without lesions or rashes.  Neurologic: Sedated on vent. Extremities: No clubbing or cyanosis.  HEENT: Normal.    Telemetry   Vpaced 80s, underlying atrial fibrillation. Personally reviewed.   EKG    N/A   Labs    CBC Recent Labs    08/04/20 0916 08/04/20 1026  WBC 5.0 6.0  HGB 12.4 13.0  HCT 36.2 38.2  MCV 92.8 92.3  PLT 133* 425   Basic Metabolic Panel Recent Labs    08/04/20 0257 08/04/20 1026  NA 137 136  K 2.9* 3.4*  CL 99 100  CO2 24 24  GLUCOSE 94 144*  BUN 38* 35*  CREATININE 1.62* 1.71*  CALCIUM 8.4* 7.9*   Liver Function Tests No results for input(s): AST, ALT, ALKPHOS, BILITOT, PROT, ALBUMIN in the last 72 hours. No results for input(s): LIPASE, AMYLASE in the last 72 hours. Cardiac Enzymes No results for input(s): CKTOTAL, CKMB, CKMBINDEX, TROPONINI in the last 72 hours.  BNP: BNP (last 3 results) Recent  Labs    09/27/19 1708 08/02/20 0832  BNP 1,032.7* 1,285.0*    ProBNP (last 3 results) No results for input(s): PROBNP in the last 8760 hours.   D-Dimer No results for input(s): DDIMER in the last 72 hours. Hemoglobin A1C No results for input(s): HGBA1C in the last 72 hours. Fasting Lipid Panel No results for input(s): CHOL, HDL, LDLCALC, TRIG, CHOLHDL, LDLDIRECT in the last 72 hours. Thyroid Function Tests No results for input(s): TSH, T4TOTAL, T3FREE, THYROIDAB in the last 72 hours.  Invalid input(s): FREET3  Other results:   Imaging    CARDIAC CATHETERIZATION  Result Date:  08/04/2020 Branch of right pulmonary artery appears to have ruptured with procedure with hemoptysis.  See procedure note, patient intubated and pulmonary angiogram done.  Patient taken to IR for attempted intervetion. Family (son and daughter) updated.     Medications:     Scheduled Medications: . sodium chloride   Intravenous Once  . [MAR Hold] amiodarone  200 mg Oral BID  . artificial tears  1 application Both Eyes C1U  . docusate  100 mg Per Tube BID  . [MAR Hold] empagliflozin  10 mg Oral Daily  . fentaNYL (SUBLIMAZE) injection  25 mcg Intravenous Once  . [MAR Hold] furosemide  60 mg Intravenous BID  . [MAR Hold] midodrine  5 mg Oral TID WC  . pantoprazole sodium  40 mg Per Tube Daily  . polyethylene glycol  17 g Per Tube Daily  . [MAR Hold] potassium chloride  40 mEq Oral Daily  . [MAR Hold] sodium chloride flush  3 mL Intravenous Q12H  . [MAR Hold] sodium chloride flush  3 mL Intravenous Q12H  . [MAR Hold] zolpidem  5 mg Oral QHS    Infusions: . [MAR Hold] sodium chloride    . sodium chloride    . sodium chloride 10 mL/hr at 08/04/20 1112  . amiodarone 60 mg/hr (08/04/20 1112)  . epinephrine    . fentaNYL infusion INTRAVENOUS 50 mcg/hr (08/04/20 0900)  . magnesium sulfate IVPB    . norepinephrine (LEVOPHED) 4mg  / 253mL infusion 12 mcg/min (08/04/20 1226)  . norepinephrine (LEVOPHED) Adult infusion    . propofol (DIPRIVAN) infusion 25 mcg/kg/min (08/04/20 1112)  . tranexamic acid      PRN Medications: [MAR Hold] sodium chloride, sodium chloride, [MAR Hold] acetaminophen, amiodarone, amiodarone, etomidate, fentaNYL, fentaNYL, fentaNYL (SUBLIMAZE) injection, fentaNYL (SUBLIMAZE) injection, fentaNYL, iohexol, iohexol, iohexol, lidocaine (PF), [MAR Hold] LORazepam, magnesium sulfate, magnesium sulfate IVPB, midazolam, midazolam, midazolam, norepinephrine (LEVOPHED) 4mg  / 291mL infusion, rocuronium, rocuronium, [MAR Hold] sodium chloride flush, sodium chloride flush, [MAR  Hold] zolpidem    Patient Profile   Ms Quilter is an 85 year old with a history ofHTN, HLD, PAF (On Xarelto), and nonischemic cardiomyopathy with chronic systolic CHF. Cardiomyopathy dates back to at least 2014. Most recent ECHO 09/2019 showed EF < 20%.  Intolerant ranolazine, losartan,spironolactone, amiodarone, sotolol, and tikosyn.  Admitted with presyncope/volume overload.   Assessment/Plan   1. Hemoptysis: Suspect due to rupture of small right PA branch when Swan catheter was wedged during RHC.  She was intubated and underwent bronchoscopy with clearing of blood.  Currently, ETT does not show additional blood.  CBC so far relatively stable with hgb 13.  - In IR for attempted embolization versus covered stent.  - Follow CBC closely.  2. Acute on chronic systolic HF: [place] 11/8451 showed no significant coronary disease. Cardiac MRI 10/17 EF 36%, diffuse HK, normal RV, biatrial enlargement, moderate MR, non-coronary LGE  pattern involving the mid-wall of the basal to mid septum and inferior wall. Possibly prior myocarditis versus a form of infiltrative disease. [employer] CRT-D s/p AV nodal ablation. Echo in 1/21 showed EF <20% with moderate LV dilation and mildly decreased RV systolic function. Low output on 1/21 RHC (CI 1.72). With age and frailty, LVAD not thought to be good option. At last appointment, she was not BiV pacing at an ideal percentage, likely due to PVCs. Symptomatically worse, NYHA class IIIb since decreasing Lasix in setting of rising creatinine. Low output HF noted this admission as well, CI on RHC today was 1.8.  EP has seen, CRT device adjusted and QRS now narrower.  Echo this admission with EF 20-25%, RV low normal systolic function, severe MR and TR.  She diuresed well yesterday, PCWP 19 on RHC today.  As above, has been intubated/sedated with pulmonary hemorrhage post-RHC.  Low dose NE started with low BP after sedation.  - Maintain MAP as needed with NE.  -  Follow co-ox, will likely restart milrinone if BP stable this afternoon.    - Continue midodrine 5 mg tid.   -Hold bisoprolol as retrying amiodarone as below.  -Off digoxin with persistently elevated level despite cutting back to 0.0625 mg qod. - Concerned that frequent PVCs may be affecting function (and also limiting BiV pacing percentage) => Retrying her on amiodarone.  - She does not qualify for Batwire given age - We had planned to discuss home milrinone for end stage low output HF versus home with hospice alone.  These discussions will be deferred for now until she is stabilized. However, I worry that even if she recovers from the current event, she will be much more debilitated.  3. Atrial fibrillation: Chronic, s/p AV nodal ablation and BiV pacing.  - Hold Xarelto with pulmonary hemorrhage.   4. PVCs/VT: Frequent PVCs have limited BiV pacing. Not a good PVC ablation candidate per EP. Had VT in 1/21. Cannot tolerate amiodarone, sotolol, or Tikosyn. Did not tolerate mexilitine with nausea. Had nausea also with ranolazine. Additionally, did not tolerate further uptitration of bisoprolol.  VT today in setting of low oxygen saturation.  - Continue amiodarone gtt.  5. Mitral regurgitation: I reviewed her echo.  At least 3+ MR, suspect functional due to dilated annulus with severely dilated LA.  TR appears worse (clearly severe).  Discussed with Dr. Burt Knack, do not think that she would be a good Mitraclip candidate as TR is clearly worse than MR.  6. AKI on CKD stage 3: Creatinine 1.6 today, stable.  However, concern for rise with contrast today with pulmonary angiography.   - Follow creatinine closely, maintain BP and cardiac output.  7. Acute hypoxemic respiratory failure: Intubated in setting of hemoptysis. CCM following.   - pCXR after procedure.   Son and daughter updated with events from today.   CRITICAL CARE Performed by: [name]  Total critical care time: 50  minutes  Critical care time was exclusive of separately billable procedures and treating other patients.  Critical care was necessary to treat or prevent imminent or life-threatening deterioration.  Critical care was time spent personally by me on the following activities: development of treatment plan with patient and/or surrogate as well as nursing, discussions with consultants, evaluation of patient's response to treatment, examination of patient, obtaining history from patient or surrogate, ordering and performing treatments and interventions, ordering and review of laboratory studies, ordering and review of radiographic studies, pulse oximetry and re-evaluation of patient's condition.  [name] 08/04/2020 12:35 PM

## 2020-08-04 NOTE — Interval H&P Note (Signed)
History and Physical Interval Note:  08/04/2020 8:11 AM  Cheryl Vargas  has presented today for surgery, with the diagnosis of heart failure.  The various methods of treatment have been discussed with the patient and family. After consideration of risks, benefits and other options for treatment, the patient has consented to  Procedure(s): RIGHT HEART CATH (N/A) as a surgical intervention.  The patient's history has been reviewed, patient examined, no change in status, stable for surgery.  I have reviewed the patient's chart and labs.  Questions were answered to the patient's satisfaction.     Jermika Olden [employer]

## 2020-08-04 NOTE — Progress Notes (Signed)
Patient ID: Cheryl Vargas, female   DOB: [birth year], 85 y.o.   MRN: [id number]  Patient stable on vent post-IR embolization. No blood in ETT. Last hgb 13.  CVP 7, SBP 120s on NE 6.   Will give gentle IVF (NS 75 cc/hr x 5 hrs) given contrast and CKD.    Keep intubated overnight, hopefully extubation tomorrow morning (+/- repeat bronch).   Hold Xarelto for now.  If remains stable, can likely restart in 24 hrs.    Updated son and daughter at bedside.   [name] 08/04/2020 4:39 PM

## 2020-08-04 NOTE — CV Procedure (Signed)
Radial arterial line placement.    Emergent consent assumed. The left wrist was prepped and draped in the routine sterile fashion a single lumen radial arterial catheter was placed in the left radial artery using a modified Seldinger technique. Good blood flow and wave forms. A dressing was placed.     Glori Bickers, MD  4:36 PM

## 2020-08-05 DIAGNOSIS — Z7189 Other specified counseling: Secondary | ICD-10-CM | POA: Diagnosis not present

## 2020-08-05 DIAGNOSIS — I5023 Acute on chronic systolic (congestive) heart failure: Secondary | ICD-10-CM | POA: Diagnosis not present

## 2020-08-05 DIAGNOSIS — Z515 Encounter for palliative care: Secondary | ICD-10-CM | POA: Diagnosis not present

## 2020-08-05 DIAGNOSIS — R042 Hemoptysis: Secondary | ICD-10-CM | POA: Diagnosis not present

## 2020-08-05 LAB — CBC
HCT: 41.5 % (ref 36.0–46.0)
HCT: 42.7 % (ref 36.0–46.0)
Hemoglobin: 14.3 g/dL (ref 12.0–15.0)
Hemoglobin: 14.7 g/dL (ref 12.0–15.0)
MCH: 31.3 pg (ref 26.0–34.0)
MCH: 31.3 pg (ref 26.0–34.0)
MCHC: 34.5 g/dL (ref 30.0–36.0)
MCHC: 34.4 g/dL (ref 30.0–36.0)
MCV: 90.8 fL (ref 80.0–100.0)
MCV: 90.9 fL (ref 80.0–100.0)
Platelets: 172 10*3/uL (ref 150–400)
Platelets: 183 10*3/uL (ref 150–400)
RBC: 4.57 MIL/uL (ref 3.87–5.11)
RBC: 4.7 MIL/uL (ref 3.87–5.11)
RDW: 13.1 % (ref 11.5–15.5)
RDW: 13.1 % (ref 11.5–15.5)
WBC: 10.3 10*3/uL (ref 4.0–10.5)
WBC: 13.4 10*3/uL — ABNORMAL HIGH (ref 4.0–10.5)
nRBC: 0 % (ref 0.0–0.2)
nRBC: 0 % (ref 0.0–0.2)

## 2020-08-05 LAB — BASIC METABOLIC PANEL
Anion gap: 14 (ref 5–15)
BUN: 28 mg/dL — ABNORMAL HIGH (ref 8–23)
CO2: 19 mmol/L — ABNORMAL LOW (ref 22–32)
Calcium: 8.2 mg/dL — ABNORMAL LOW (ref 8.9–10.3)
Chloride: 103 mmol/L (ref 98–111)
Creatinine, Ser: 1.33 mg/dL — ABNORMAL HIGH (ref 0.44–1.00)
GFR, Estimated: 40 mL/min — ABNORMAL LOW (ref >60)
Glucose, Bld: 117 mg/dL — ABNORMAL HIGH (ref 70–99)
Potassium: 3.4 mmol/L — ABNORMAL LOW (ref 3.5–5.1)
Sodium: 136 mmol/L (ref 135–145)

## 2020-08-05 LAB — COOXEMETRY PANEL
Carboxyhemoglobin: 1.1 % (ref 0.5–1.5)
Methemoglobin: 0.8 % (ref 0.0–1.5)
O2 Saturation: 60.6 %
Total hemoglobin: 14.3 g/dL (ref 12.0–16.0)

## 2020-08-05 LAB — TRIGLYCERIDES: Triglycerides: 101 mg/dL (ref <150)

## 2020-08-05 MED ORDER — LORAZEPAM 0.5 MG PO TABS: 0.5000 mg | ORAL_TABLET | Freq: Two times a day (BID) | ORAL | Status: DC | PRN

## 2020-08-05 MED ORDER — POLYETHYLENE GLYCOL 3350 17 G PO PACK: 17.0000 g | PACK | Freq: Every day | ORAL | Status: DC

## 2020-08-05 MED ORDER — LORAZEPAM 0.5 MG PO TABS
0.5000 mg | ORAL_TABLET | Freq: Two times a day (BID) | ORAL | Status: DC | PRN
  Administered 2020-08-06: 0.5 mg via ORAL
  Filled 2020-08-05: qty 1

## 2020-08-05 MED ORDER — ACETAMINOPHEN 325 MG PO TABS: 650.0000 mg | ORAL_TABLET | ORAL | Status: DC | PRN

## 2020-08-05 MED ORDER — DOCUSATE SODIUM 100 MG PO CAPS
100.0000 mg | ORAL_CAPSULE | Freq: Two times a day (BID) | ORAL | Status: DC
  Administered 2020-08-05 – 2020-08-16 (×16): 100 mg via ORAL
  Filled 2020-08-05 (×24): qty 1

## 2020-08-05 MED ORDER — ACETAMINOPHEN 325 MG PO TABS
650.0000 mg | ORAL_TABLET | ORAL | Status: DC | PRN
  Administered 2020-08-07 – 2020-08-13 (×8): 650 mg via ORAL
  Filled 2020-08-05 (×8): qty 2

## 2020-08-05 MED ORDER — ACETAMINOPHEN 325 MG PO TABS
650.0000 mg | ORAL_TABLET | ORAL | Status: DC | PRN
  Administered 2020-08-05: 650 mg via ORAL
  Filled 2020-08-05: qty 2

## 2020-08-05 MED ORDER — MIDODRINE HCL 5 MG PO TABS: 5.0000 mg | ORAL_TABLET | Freq: Three times a day (TID) | ORAL | Status: DC

## 2020-08-05 MED ORDER — POLYETHYLENE GLYCOL 3350 17 G PO PACK
17.0000 g | PACK | Freq: Every day | ORAL | Status: DC
  Administered 2020-08-06 – 2020-08-12 (×6): 17 g via ORAL
  Filled 2020-08-05 (×12): qty 1

## 2020-08-05 MED ORDER — PHENOL 1.4 % MT LIQD
1.0000 | OROMUCOSAL | Status: DC | PRN
  Administered 2020-08-05 – 2020-08-11 (×2): 1 via OROMUCOSAL
  Filled 2020-08-05 (×2): qty 177

## 2020-08-05 MED ORDER — PANTOPRAZOLE SODIUM 40 MG PO TBEC
40.0000 mg | DELAYED_RELEASE_TABLET | Freq: Every day | ORAL | Status: DC
  Administered 2020-08-06: 40 mg via ORAL
  Filled 2020-08-05: qty 1

## 2020-08-05 MED ORDER — MIDODRINE HCL 5 MG PO TABS
5.0000 mg | ORAL_TABLET | Freq: Three times a day (TID) | ORAL | Status: DC
  Administered 2020-08-06: 5 mg via ORAL
  Filled 2020-08-05: qty 1

## 2020-08-05 MED ORDER — ZOLPIDEM TARTRATE 5 MG PO TABS
5.0000 mg | ORAL_TABLET | Freq: Every evening | ORAL | Status: DC | PRN
  Administered 2020-08-05 – 2020-08-17 (×13): 5 mg via ORAL
  Filled 2020-08-05 (×14): qty 1

## 2020-08-05 MED ORDER — POTASSIUM CHLORIDE 20 MEQ PO PACK
40.0000 meq | PACK | Freq: Once | ORAL | Status: AC
  Administered 2020-08-05: 40 meq
  Filled 2020-08-05: qty 2

## 2020-08-05 MED FILL — Amiodarone HCl Inj 150 MG/3ML (50 MG/ML): INTRAVENOUS | Qty: 3 | Status: AC

## 2020-08-05 MED FILL — Lidocaine HCl Local Preservative Free (PF) Inj 1%: INTRAMUSCULAR | Qty: 30 | Status: AC

## 2020-08-05 MED FILL — Heparin Sod (Porcine)-NaCl IV Soln 1000 Unit/500ML-0.9%: INTRAVENOUS | Qty: 1000 | Status: AC

## 2020-08-05 NOTE — Progress Notes (Signed)
Patient ID: Cheryl Vargas, female   DOB: 1935-04-08, 85 y.o.   MRN: [id number]       Advanced Heart Failure Rounding Note  PCP-Cardiologist: [name], MD   Subjective:    Had localized pulmonary artery perforation during cath on 11/24. Intubated and underwent successful coil embolization with IR. Had VT x 1 during cath and shocked.   Remains intubated. On NE 4. Co-ox 61% Awake on vent. Follows commands. Bronch this am was dry.   Creatinine 1.7 -> 1.3 . Hgb stable  14.3   - ECHO EF 20-25% RV normal, biatrial enlargement, IVC normal.  - RHC Procedural Findings (incomplete study due to emergency): Hemodynamics (mmHg) RA mean 6 PA 41/16, mean 26 PCWP mean 19 Oxygen saturations: PA 57% AO 93% Cardiac Output (Fick) 2.92  Cardiac Index (Fick) 1.8    Objective:   Weight Range: 56.8 kg Body mass index is 20.84 kg/m.   Vital Signs:   Temp:  [97.3 F (36.3 C)-97.8 F (36.6 C)] 97.8 F (36.6 C) (11/25 0800) Pulse Rate:  [0-172] 82 (11/25 0700) Resp:  [0-24] 16 (11/25 0700) BP: (57-176)/(33-95) 112/52 (11/25 0842) SpO2:  [0 %-100 %] 97 % (11/25 0700) Arterial Line BP: (102-128)/(51-60) 108/51 (11/25 0700) FiO2 (%):  [40 %-100 %] 40 % (11/25 0842) Weight:  [56.8 kg] 56.8 kg (11/25 0500) Last BM Date: 08/02/20  Weight change: Filed Weights   08/02/20 0927 08/03/20 0400 08/05/20 0500  Weight: 57 kg 56.6 kg 56.8 kg    Intake/Output:   Intake/Output Summary (Last 24 hours) at 08/05/2020 0843 Last data filed at 08/05/2020 0837 Gross per 24 hour  Intake 1507.69 ml  Output 1355 ml  Net 152.69 ml      Physical Exam    General: Intubated. Awake following commands.  HEENT: normal + ETT.  OG with old blood being sucked out Neck: supple. RIJ TL Carotids 2+ bilat; no bruits. No lymphadenopathy or thryomegaly appreciated. Cor: PMI nondisplaced. Regular rate & rhythm. No rubs, gallops or murmurs. Lungs: clear Abdomen: soft, nontender, nondistended. No  hepatosplenomegaly. No bruits or masses. Good bowel sounds. Extremities: no cyanosis, clubbing, rash, edema Neuro:awake on vent following commands  Telemetry   BiVpaced 80sunderlying atrial fibrillation. Personally reviewed.   Labs    CBC Recent Labs    08/04/20 2156 08/05/20 0442  WBC 9.0 10.3  HGB 14.2 14.3  HCT 41.0 41.5  MCV 90.1 90.8  PLT 168 244   Basic Metabolic Panel Recent Labs    08/04/20 1026 08/04/20 1026 08/04/20 1137 08/05/20 0442  NA 136   < > 141 136  K 3.4*   < > 3.1* 3.4*  CL 100  --   --  103  CO2 24  --   --  19*  GLUCOSE 144*  --   --  117*  BUN 35*  --   --  28*  CREATININE 1.71*  --   --  1.33*  CALCIUM 7.9*  --   --  8.2*   < > = values in this interval not displayed.   Liver Function Tests No results for input(s): AST, ALT, ALKPHOS, BILITOT, PROT, ALBUMIN in the last 72 hours. No results for input(s): LIPASE, AMYLASE in the last 72 hours. Cardiac Enzymes No results for input(s): CKTOTAL, CKMB, CKMBINDEX, TROPONINI in the last 72 hours.  BNP: BNP (last 3 results) Recent Labs    09/27/19 1708 08/02/20 0832  BNP 1,032.7* 1,285.0*    ProBNP (last 3 results) No results  for input(s): PROBNP in the last 8760 hours.   D-Dimer No results for input(s): DDIMER in the last 72 hours. Hemoglobin A1C No results for input(s): HGBA1C in the last 72 hours. Fasting Lipid Panel Recent Labs    08/05/20 0442  TRIG 101   Thyroid Function Tests No results for input(s): TSH, T4TOTAL, T3FREE, THYROIDAB in the last 72 hours.  Invalid input(s): FREET3  Other results:   Imaging    DG Abd 1 View  Result Date: 08/04/2020 CLINICAL DATA:  OG tube placement EXAM: ABDOMEN - 1 VIEW COMPARISON:  December 19, 2017 and CT of the abdomen and pelvis from Feb 03, 2020 FINDINGS: Single abdominal radiograph excludes pelvic area. Dual lead pacer defibrillator in place with signs of interval coil embolization about the RIGHT chest. No lobar consolidation or  effusion at the lung bases. Gastric tube likely in distal stomach. Side port visualized. Tip off the field of view. Signs of vascular disease. On limited assessment no acute skeletal process. IMPRESSION: Interval coil embolization in the RIGHT chest. Gastric tube in the stomach, tip in the distal stomach. Electronically Signed   By: Zetta Bills M.D.   On: 08/04/2020 16:25   IR Angiogram Pulmonary Right Selective  Result Date: 08/04/2020 INDICATION: Right heart catheterization complicated by pulmonary arterial bleed, hemoptysis, pseudoaneurysm identified on imaging. EXAM: RIGHT PULMONARY ARTERIOGRAM WITH CTA ARTERIAL EMBOLIZATION COMPARISON:  cardiac catheterization images from earlier the same day MEDICATIONS: No antibiotics were indicated ANESTHESIA/SEDATION: Patient arrived and remained intubated under care of department of anesthesia FLUOROSCOPY TIME:  15 minute 54 seconds; 709 mGy COMPLICATIONS: None immediate. TECHNIQUE: Emergency consent was obtained from Dr. Aundra Dubin after a thorough discussion of the procedural risks, benefits and alternatives. All questions were addressed. Maximal Sterile Barrier Technique was utilized including caps, mask, sterile gowns, sterile gloves, sterile drape, hand hygiene and skin antiseptic. A timeout was performed prior to the initiation of the procedure. The previously placed right brachial venous sheath and surrounding skin was prepped with chlorhexidine, draped in usual sterile fashion. A 018 guidewire was advanced through the previously placed right pulmonary artery catheter which was exchanged for a 5 [place] vert catheter. Through this, the guidewire was upsized with a 035 Amplatz and the sheath was exchanged for a long 8 French sheath, placed to the main pulmonary artery. The vert catheter was advanced to the right lower lobe pulmonary artery for rotational pulmonary arteriography with 3D reconstruction. After the lesion localized, the feeding branch was selectively  catheterized. After selective confirmatory arteriography, the affected branch was embolized with 5 and 6 mm coils. A coaxial 018 microcatheter was also required due to the diameter of the smaller coils. After final arteriography, catheter and sheath were removed and hemostasis achieved at the site. The patient tolerated the procedure well. FINDINGS: Rotational angiography demonstrated persistent 1.7 cm pseudoaneurysm just beyond the bifurcation of the anterior basal segment branch right lower lobe pulmonary artery, a short distance from the interlobar artery. Due to the diminutive size of the vessel distal to the lesion as well as the proximity of the lesion to the bifurcation of the anterior basal segment right lower lobe artery, coil embolization was selected in lieu of attempted covered stent deployment. Multiple 5 and 6 mm interlock coils were advanced into the pseudoaneurysm and involved vessel both proximal and distal to the defect, with the aid of a coaxial microcatheter. This did require coil placement across the bifurcation of the feeding vessel proximal to the lesion. Follow-up arteriography demonstrates successful cessation  of flow into the feeding branch with no further opacification of the pseudoaneurysm. No evidence of continued active hemorrhage. IMPRESSION: 1. 1.7 cm pseudoaneurysm involving anterior basal segment branch right lower lobe pulmonary artery. 2. Technically successful subselective coil embolization with cessation of flow into the pseudoaneurysm. Electronically Signed   By: Corlis Leak M.D.   On: 08/04/2020 16:55   IR Transcath/Emboliz  Result Date: 08/04/2020 INDICATION: Right heart catheterization complicated by pulmonary arterial bleed, hemoptysis, pseudoaneurysm identified on imaging. EXAM: RIGHT PULMONARY ARTERIOGRAM WITH CTA ARTERIAL EMBOLIZATION COMPARISON:  cardiac catheterization images from earlier the same day MEDICATIONS: No antibiotics were indicated ANESTHESIA/SEDATION:  Patient arrived and remained intubated under care of department of anesthesia FLUOROSCOPY TIME:  15 minute 54 seconds; 840 mGy COMPLICATIONS: None immediate. TECHNIQUE: Emergency consent was obtained from Dr. Shirlee Latch after a thorough discussion of the procedural risks, benefits and alternatives. All questions were addressed. Maximal Sterile Barrier Technique was utilized including caps, mask, sterile gowns, sterile gloves, sterile drape, hand hygiene and skin antiseptic. A timeout was performed prior to the initiation of the procedure. The previously placed right brachial venous sheath and surrounding skin was prepped with chlorhexidine, draped in usual sterile fashion. A 018 guidewire was advanced through the previously placed right pulmonary artery catheter which was exchanged for a 5 [place] vert catheter. Through this, the guidewire was upsized with a 035 Amplatz and the sheath was exchanged for a long 8 French sheath, placed to the main pulmonary artery. The vert catheter was advanced to the right lower lobe pulmonary artery for rotational pulmonary arteriography with 3D reconstruction. After the lesion localized, the feeding branch was selectively catheterized. After selective confirmatory arteriography, the affected branch was embolized with 5 and 6 mm coils. A coaxial 018 microcatheter was also required due to the diameter of the smaller coils. After final arteriography, catheter and sheath were removed and hemostasis achieved at the site. The patient tolerated the procedure well. FINDINGS: Rotational angiography demonstrated persistent 1.7 cm pseudoaneurysm just beyond the bifurcation of the anterior basal segment branch right lower lobe pulmonary artery, a short distance from the interlobar artery. Due to the diminutive size of the vessel distal to the lesion as well as the proximity of the lesion to the bifurcation of the anterior basal segment right lower lobe artery, coil embolization was selected in lieu  of attempted covered stent deployment. Multiple 5 and 6 mm interlock coils were advanced into the pseudoaneurysm and involved vessel both proximal and distal to the defect, with the aid of a coaxial microcatheter. This did require coil placement across the bifurcation of the feeding vessel proximal to the lesion. Follow-up arteriography demonstrates successful cessation of flow into the feeding branch with no further opacification of the pseudoaneurysm. No evidence of continued active hemorrhage. IMPRESSION: 1. 1.7 cm pseudoaneurysm involving anterior basal segment branch right lower lobe pulmonary artery. 2. Technically successful subselective coil embolization with cessation of flow into the pseudoaneurysm. Electronically Signed   By: Corlis Leak M.D.   On: 08/04/2020 16:55   IR CT Spine Ltd  Result Date: 08/04/2020 INDICATION: Right heart catheterization complicated by pulmonary arterial bleed, hemoptysis, pseudoaneurysm identified on imaging. EXAM: RIGHT PULMONARY ARTERIOGRAM WITH CTA ARTERIAL EMBOLIZATION COMPARISON:  cardiac catheterization images from earlier the same day MEDICATIONS: No antibiotics were indicated ANESTHESIA/SEDATION: Patient arrived and remained intubated under care of department of anesthesia FLUOROSCOPY TIME:  15 minute 54 seconds; 840 mGy COMPLICATIONS: None immediate. TECHNIQUE: Emergency consent was obtained from Dr. Shirlee Latch after a thorough discussion  of the procedural risks, benefits and alternatives. All questions were addressed. Maximal Sterile Barrier Technique was utilized including caps, mask, sterile gowns, sterile gloves, sterile drape, hand hygiene and skin antiseptic. A timeout was performed prior to the initiation of the procedure. The previously placed right brachial venous sheath and surrounding skin was prepped with chlorhexidine, draped in usual sterile fashion. A 018 guidewire was advanced through the previously placed right pulmonary artery catheter which was  exchanged for a 5 [place] vert catheter. Through this, the guidewire was upsized with a 035 Amplatz and the sheath was exchanged for a long 8 French sheath, placed to the main pulmonary artery. The vert catheter was advanced to the right lower lobe pulmonary artery for rotational pulmonary arteriography with 3D reconstruction. After the lesion localized, the feeding branch was selectively catheterized. After selective confirmatory arteriography, the affected branch was embolized with 5 and 6 mm coils. A coaxial 018 microcatheter was also required due to the diameter of the smaller coils. After final arteriography, catheter and sheath were removed and hemostasis achieved at the site. The patient tolerated the procedure well. FINDINGS: Rotational angiography demonstrated persistent 1.7 cm pseudoaneurysm just beyond the bifurcation of the anterior basal segment branch right lower lobe pulmonary artery, a short distance from the interlobar artery. Due to the diminutive size of the vessel distal to the lesion as well as the proximity of the lesion to the bifurcation of the anterior basal segment right lower lobe artery, coil embolization was selected in lieu of attempted covered stent deployment. Multiple 5 and 6 mm interlock coils were advanced into the pseudoaneurysm and involved vessel both proximal and distal to the defect, with the aid of a coaxial microcatheter. This did require coil placement across the bifurcation of the feeding vessel proximal to the lesion. Follow-up arteriography demonstrates successful cessation of flow into the feeding branch with no further opacification of the pseudoaneurysm. No evidence of continued active hemorrhage. IMPRESSION: 1. 1.7 cm pseudoaneurysm involving anterior basal segment branch right lower lobe pulmonary artery. 2. Technically successful subselective coil embolization with cessation of flow into the pseudoaneurysm. Electronically Signed   By: Lucrezia Europe M.D.   On:  08/04/2020 16:55   IR EMBO ART  VEN HEMORR LYMPH EXTRAV  INC GUIDE ROADMAPPING  Result Date: 08/04/2020 INDICATION: Right heart catheterization complicated by pulmonary arterial bleed, hemoptysis, pseudoaneurysm identified on imaging. EXAM: RIGHT PULMONARY ARTERIOGRAM WITH CTA ARTERIAL EMBOLIZATION COMPARISON:  cardiac catheterization images from earlier the same day MEDICATIONS: No antibiotics were indicated ANESTHESIA/SEDATION: Patient arrived and remained intubated under care of department of anesthesia FLUOROSCOPY TIME:  15 minute 54 seconds; 536 mGy COMPLICATIONS: None immediate. TECHNIQUE: Emergency consent was obtained from Dr. Aundra Dubin after a thorough discussion of the procedural risks, benefits and alternatives. All questions were addressed. Maximal Sterile Barrier Technique was utilized including caps, mask, sterile gowns, sterile gloves, sterile drape, hand hygiene and skin antiseptic. A timeout was performed prior to the initiation of the procedure. The previously placed right brachial venous sheath and surrounding skin was prepped with chlorhexidine, draped in usual sterile fashion. A 018 guidewire was advanced through the previously placed right pulmonary artery catheter which was exchanged for a 5 [place] vert catheter. Through this, the guidewire was upsized with a 035 Amplatz and the sheath was exchanged for a long 8 French sheath, placed to the main pulmonary artery. The vert catheter was advanced to the right lower lobe pulmonary artery for rotational pulmonary arteriography with 3D reconstruction. After the lesion localized, the  feeding branch was selectively catheterized. After selective confirmatory arteriography, the affected branch was embolized with 5 and 6 mm coils. A coaxial 018 microcatheter was also required due to the diameter of the smaller coils. After final arteriography, catheter and sheath were removed and hemostasis achieved at the site. The patient tolerated the procedure  well. FINDINGS: Rotational angiography demonstrated persistent 1.7 cm pseudoaneurysm just beyond the bifurcation of the anterior basal segment branch right lower lobe pulmonary artery, a short distance from the interlobar artery. Due to the diminutive size of the vessel distal to the lesion as well as the proximity of the lesion to the bifurcation of the anterior basal segment right lower lobe artery, coil embolization was selected in lieu of attempted covered stent deployment. Multiple 5 and 6 mm interlock coils were advanced into the pseudoaneurysm and involved vessel both proximal and distal to the defect, with the aid of a coaxial microcatheter. This did require coil placement across the bifurcation of the feeding vessel proximal to the lesion. Follow-up arteriography demonstrates successful cessation of flow into the feeding branch with no further opacification of the pseudoaneurysm. No evidence of continued active hemorrhage. IMPRESSION: 1. 1.7 cm pseudoaneurysm involving anterior basal segment branch right lower lobe pulmonary artery. 2. Technically successful subselective coil embolization with cessation of flow into the pseudoaneurysm. Electronically Signed   By: Lucrezia Europe M.D.   On: 08/04/2020 16:55     Medications:     Scheduled Medications: . sodium chloride   Intravenous Once  . artificial tears  1 application Both Eyes P8K  . chlorhexidine gluconate (MEDLINE KIT)  15 mL Mouth Rinse BID  . Chlorhexidine Gluconate Cloth  6 each Topical Daily  . docusate  100 mg Per Tube BID  . fentaNYL (SUBLIMAZE) injection  25 mcg Intravenous Once  . mouth rinse  15 mL Mouth Rinse 10 times per day  . midodrine  5 mg Per Tube TID WC  . pantoprazole sodium  40 mg Per Tube Daily  . polyethylene glycol  17 g Per Tube Daily  . potassium chloride  40 mEq Per Tube Once  . sodium chloride flush  3 mL Intravenous Q12H  . sodium chloride flush  3 mL Intravenous Q12H    Infusions: . sodium chloride    .  sodium chloride    . amiodarone 30 mg/hr (08/05/20 0700)  . fentaNYL infusion INTRAVENOUS 50 mcg/hr (08/04/20 0900)  . norepinephrine (LEVOPHED) Adult infusion 4 mcg/min (08/05/20 0700)  . propofol (DIPRIVAN) infusion 25.029 mcg/kg/min (08/05/20 9983)  . tranexamic acid      PRN Medications: sodium chloride, Place/Maintain arterial line **AND** sodium chloride, acetaminophen, fentaNYL, fentaNYL (SUBLIMAZE) injection, fentaNYL (SUBLIMAZE) injection, LORazepam, midazolam, midazolam, rocuronium, sodium chloride flush, sodium chloride flush, zolpidem    Patient Profile   Ms Seel is an 85 year old with a history ofHTN, HLD, PAF (On Xarelto), and nonischemic cardiomyopathy with chronic systolic CHF. Cardiomyopathy dates back to at least 2014. Most recent ECHO 09/2019 showed EF < 20%.  Intolerant ranolazine, losartan,spironolactone, amiodarone, sotolol, and tikosyn.  Admitted with presyncope/volume overload.   Assessment/Plan   1. Hemoptysis: Due to small, isolated right PA branch rupture on 11/24. She was intubated and underwent bronchoscopy with clearing of blood. Suspect bleeding contained quickly. S/p coiling PA branch in IR 11/24 - Hgb stable at 14.3 2. Acute on chronic systolic HF: [place] 11/8248 showed no significant coronary disease. Cardiac MRI 10/17 EF 36%, diffuse HK, normal RV, biatrial enlargement, moderate MR, non-coronary LGE pattern  involving the mid-wall of the basal to mid septum and inferior wall. Possibly prior myocarditis versus a form of infiltrative disease. [employer] CRT-D s/p AV nodal ablation. Echo in 1/21 showed EF <20% with moderate LV dilation and mildly decreased RV systolic function. Low output on 1/21 RHC (CI 1.72). With age and frailty, LVAD not thought to be good option. At last appointment, she was not BiV pacing at an ideal percentage, likely due to PVCs. Symptomatically worse, NYHA class IIIb since decreasing Lasix in setting of rising creatinine.  Low output HF noted this admission as well, CI on RHC today was 1.8.  EP has seen, CRT device adjusted and QRS now narrower.  Echo this admission with EF 20-25%, RV low normal systolic function, severe MR and TR.  She diuresed well yesterday, PCWP 19 on RHC today.  As above, has been intubated/sedated with pulmonary hemorrhage post-RHC.  Remains on low-dose NE. Co-ox 61% - Maintain MAP as needed with NE. Can wean as tolerated.  - Once BP stable can restart milrinone - Continue midodrine 5 mg tid.   -Hold bisoprolol as retrying amiodarone as below.  -Off digoxin with persistently elevated level despite cutting back to 0.0625 mg qod. - Concerned that frequent PVCs may be affecting function (and also limiting BiV pacing percentage) => Retrying her on amiodarone.  - She does not qualify for Batwire given age - We had planned to discuss home milrinone for end stage low output HF versus home with hospice alone.  These discussions will be deferred for now until she is stabilized. However, I worry that even if she recovers from the current event, she will be much more debilitated.  3. Atrial fibrillation: Chronic, s/p AV nodal ablation and BiV pacing.  - Hold Xarelto with pulmonary hemorrhage.  Likely can restart heparin in 24-48 hours 4. PVCs/VT: Frequent PVCs have limited BiV pacing. Not a good PVC ablation candidate per EP. Had VT in 1/21. Cannot tolerate amiodarone, sotolol, or Tikosyn. Did not tolerate mexilitine with nausea. Had nausea also with ranolazine. Additionally, did not tolerate further uptitration of bisoprolol.  VT today in setting of low oxygen saturation.  - Continue amiodarone gtt.  5. Mitral regurgitation: I reviewed her echo.  At least 3+ MR, suspect functional due to dilated annulus with severely dilated LA.  TR appears worse (clearly severe).  Discussed with Dr. Burt Knack, do not think that she would be a good Mitraclip candidate as TR is clearly worse than MR.  6. AKI on CKD stage 3:  Creatinine 1.3 today, stable.  - Follow creatinine closely with recent contrast load, maintain BP and cardiac output.  7. Acute hypoxemic respiratory failure: Intubated in setting of hemoptysis. CCM following.   - bronch thi am was dry. - weaning vent now - d/w CCM at bedside 8. Hypokalemia - Supp K  CRITICAL CARE Performed by: Glori Bickers  Total critical care time: 45 minutes  Critical care time was exclusive of separately billable procedures and treating other patients.  Critical care was necessary to treat or prevent imminent or life-threatening deterioration.  Critical care was time spent personally by me on the following activities: development of treatment plan with patient and/or surrogate as well as nursing, discussions with consultants, evaluation of patient's response to treatment, examination of patient, obtaining history from patient or surrogate, ordering and performing treatments and interventions, ordering and review of laboratory studies, ordering and review of radiographic studies, pulse oximetry and re-evaluation of patient's condition.   Glori Bickers 08/05/2020 8:43  AM

## 2020-08-05 NOTE — Plan of Care (Signed)
Patient alert but still intubated. Understands the importance of keeping leg straight post sheath removal

## 2020-08-05 NOTE — Progress Notes (Signed)
NAME:  Cheryl Vargas, MRN:  [id number], DOB:  [birth year], LOS: 3 ADMISSION DATE:  08/02/2020, CONSULTATION DATE:  08/04/2020 REFERRING MD:  [place], CHIEF COMPLAINT:  Hemoptysis.   HPI/course in hospital  85 year old woman who experienced sudden massive hemoptysis following balloon inflation during pulmonary artery catheterization.  Known nonischemic cardiomyopathy with ejection fraction less than 20%, followed closely in heart failure clinic.  Ongoing episodes of presyncope with associated shortness of breath occurring with exertion.  Weight has been stable to decreasing.  Creatinine has increased.  Admitted to hospital and diuresed for possible volume overload. Underwent right heart catheterization for hemodynamic evaluation today.  Filling pressures did not appear particularly elevated. Unfortunately, patient developed gross hemoptysis immediately following balloon inflation.  Past Medical History   Past Medical History:  Diagnosis Date  . Arthritis   . Chronic combined systolic (congestive) and diastolic (congestive) heart failure ([place])   . Mitral regurgitation   . Non-ischemic cardiomyopathy ([place])   . Persistent atrial fibrillation ([place])   . Ventricular tachycardia [facility])      Past Surgical History:  Procedure Laterality Date  . AV NODE ABLATION N/A 09/07/2017   Procedure: AV NODE ABLATION;  Surgeon: [name], MD;  Location: [place] CV LAB;  Service: Cardiovascular;  Laterality: N/A;  . BIV ICD INSERTION CRT-D N/A 09/07/2017   Procedure: BIV ICD INSERTION CRT-D;  Surgeon: [name], MD;  Location: [place] CV LAB;  Service: Cardiovascular;  Laterality: N/A;  . BUNIONECTOMY    . CARDIAC CATHETERIZATION     in 2004 at [facility]. "Insignificant blockage" per patient  . CARDIAC CATHETERIZATION N/A 04/25/2016   Procedure: Left Heart Cath and Coronary Angiography;  Surgeon: Nelva Bush, MD;  Location: [place] CV LAB;  Service:  Cardiovascular;  Laterality: N/A;  . CARDIOVERSION N/A 06/29/2017   Procedure: CARDIOVERSION;  Surgeon: Larey Dresser, MD;  Location: [facility] ENDOSCOPY;  Service: Cardiovascular;  Laterality: N/A;  . CARDIOVERSION N/A 08/28/2017   Procedure: CARDIOVERSION;  Surgeon: Larey Dresser, MD;  Location: [place];  Service: Cardiovascular;  Laterality: N/A;  . IR ANGIOGRAM PULMONARY RIGHT SELECTIVE  08/04/2020  . IR CT SPINE LTD  08/04/2020  . IR EMBO ART  VEN HEMORR LYMPH EXTRAV  INC GUIDE ROADMAPPING  08/04/2020  . IR TRANSCATH/EMBOLIZ  08/04/2020  . RIGHT HEART CATH N/A 09/29/2019   Procedure: RIGHT HEART CATH;  Surgeon: Larey Dresser, MD;  Location: [place] CV LAB;  Service: Cardiovascular;  Laterality: N/A;  . RIGHT HEART CATH N/A 08/04/2020   Procedure: RIGHT HEART CATH;  Surgeon: Larey Dresser, MD;  Location: [place] CV LAB;  Service: Cardiovascular;  Laterality: N/A;  . SHOULDER SURGERY     closed reduction  . TEE WITHOUT CARDIOVERSION N/A 06/29/2017   Procedure: TRANSESOPHAGEAL ECHOCARDIOGRAM (TEE);  Surgeon: Larey Dresser, MD;  Location: [facility] ENDOSCOPY;  Service: Cardiovascular;  Laterality: N/A;  . TONSILLECTOMY    . TOTAL HIP ARTHROPLASTY Left 03/18/2013   Procedure: TOTAL HIP ARTHROPLASTY ANTERIOR APPROACH;  Surgeon: Mauri Pole, MD;  Location: [facility] ORS;  Service: Orthopedics;  Laterality: Left;  . TUBAL LIGATION        Interim history/subjective:  Bleeding appeared to stop promptly after intubation.  Successful embolization of right lower lobe segmental pulmonary artery.  Repeat bronchoscopy today showed no evidence of bleeding.  Objective   Blood pressure (!) 112/52, pulse 81, temperature 98.5 F (36.9 C), resp. rate 16, height 5\' 5"  (1.651 m), weight  56.8 kg, SpO2 100 %. CVP:  [2 mmHg-8 mmHg] 2 mmHg  Vent Mode: PRVC FiO2 (%):  [40 %-100 %] 40 % Set Rate:  [16 bmp] 16 bmp Vt Set:  [450 mL] 450 mL PEEP:  [5 cmH20-8 cmH20] 5 cmH20 Plateau Pressure:  [13  cmH20-19 cmH20] 16 cmH20   Intake/Output Summary (Last 24 hours) at 08/05/2020 1227 Last data filed at 08/05/2020 0900 Gross per 24 hour  Intake 1507.69 ml  Output 1430 ml  Net 77.69 ml   Filed Weights   08/02/20 0927 08/03/20 0400 08/05/20 0500  Weight: 57 kg 56.6 kg 56.8 kg     Physical Exam Constitutional:      Appearance: She is cachectic.     Interventions: Sedation has been weaned and she is able to follow commands HENT:     Head: Normocephalic and atraumatic.     Comments: 7 mm endotracheal tube at 22 cm. Cardiovascular:     Rate and Rhythm: Regular rhythm.     Heart sounds: Normal heart sounds.  Pulmonary:     Effort: She is intubated.  She is tolerating pressure support ventilation with good tidal volumes    Breath sounds: Chest clear Abdominal:     Palpations: Abdomen is soft.  Musculoskeletal:     Right lower leg: No edema.     Left lower leg: No edema.  Skin:    General: Skin is warm and dry.  Neurological:     Mental Status: She is somnolent but will follow commands.    GCS: 15.     Ancillary tests (personally reviewed)  CBC: Recent Labs  Lab 08/02/20 0832 08/02/20 0832 08/04/20 0916 08/04/20 0916 08/04/20 1025 08/04/20 1026 08/04/20 1137 08/04/20 2156 08/05/20 0442  WBC 6.5  --  5.0  --   --  6.0  --  9.0 10.3  HGB 15.1*   < > 12.4   < > 12.9 13.0 12.6 14.2 14.3  HCT 45.4   < > 36.2   < > 38.0 38.2 37.0 41.0 41.5  MCV 93.2  --  92.8  --   --  92.3  --  90.1 90.8  PLT 151  --  133*  --   --  153  --  168 172   < > = values in this interval not displayed.    Basic Metabolic Panel: Recent Labs  Lab 08/02/20 0832 08/02/20 0832 08/03/20 0248 08/03/20 0248 08/04/20 0257 08/04/20 1025 08/04/20 1026 08/04/20 1137 08/05/20 0442  NA 138   < > 136   < > 137 139 136 141 136  K 3.4*   < > 3.8   < > 2.9* 3.5 3.4* 3.1* 3.4*  CL 100  --  100  --  99  --  100  --  103  CO2 26  --  22  --  24  --  24  --  19*  GLUCOSE 121*  --  121*  --  94  --   144*  --  117*  BUN 38*  --  42*  --  38*  --  35*  --  28*  CREATININE 1.52*  --  1.74*  --  1.62*  --  1.71*  --  1.33*  CALCIUM 9.1  --  8.6*  --  8.4*  --  7.9*  --  8.2*   < > = values in this interval not displayed.   GFR: Estimated Creatinine Clearance: 28.7 mL/min (A) (by C-G formula  based on SCr of 1.33 mg/dL (H)). Recent Labs  Lab 08/02/20 0832 08/02/20 0922 08/02/20 1122 08/04/20 0916 08/04/20 1026 08/04/20 2156 08/05/20 0442  WBC   < >  --   --  5.0 6.0 9.0 10.3  LATICACIDVEN  --  1.8 2.1*  --   --   --   --    < > = values in this interval not displayed.    Liver Function Tests: No results for input(s): AST, ALT, ALKPHOS, BILITOT, PROT, ALBUMIN in the last 168 hours. No results for input(s): LIPASE, AMYLASE in the last 168 hours. No results for input(s): AMMONIA in the last 168 hours.  ABG    Component Value Date/Time   PHART 7.368 08/04/2020 1137   PCO2ART 42.9 08/04/2020 1137   PO2ART 160 (H) 08/04/2020 1137   HCO3 24.6 08/04/2020 1137   TCO2 26 08/04/2020 1137   ACIDBASEDEF 1.0 08/04/2020 1137   O2SAT 60.6 08/05/2020 0442     Coagulation Profile: No results for input(s): INR, PROTIME in the last 168 hours.  Cardiac Enzymes: No results for input(s): CKTOTAL, CKMB, CKMBINDEX, TROPONINI in the last 168 hours.  HbA1C: Hgb A1c MFr Bld  Date/Time Value Ref Range Status  12/17/2017 03:45 AM 5.6 4.8 - 5.6 % Final    Comment:    (NOTE) Pre diabetes:          5.7%-6.4% Diabetes:              >6.4% Glycemic control for   <7.0% adults with diabetes     CBG: No results for input(s): GLUCAP in the last 168 hours.   Assessment & Plan:  Critically ill due to acute hypoxic respiratory failure requiring mechanical ventilation secondary to massive atelectasis from likely pulmonary artery branch rupture. Nonischemic cardiomyopathy Cardiac cachexia Status post biventricular pacemaker Acute decompensated systolic heart failure Acute on chronic kidney  disease stage III.  Plan: -Proceed with extubation.  Daily Goals Checklist  Pain/Anxiety/Delirium protocol (if indicated): All sedation stopped VAP protocol (if indicated): Bundle in place Respiratory support goals: Wean to extubate  blood pressure target: Keep systolic blood pressure 66-599. Continue milrinone for CI support.  DVT prophylaxis: Mechanical prophylaxis only Nutritional status and feeding goals: Hold feeds for now GI prophylaxis: Protonix Fluid status goals: Per heart failure Urinary catheter: Assessment of intravascular volume Central lines: Right arm sheath Glucose control: Phase 1 glycemic protocol Mobility/therapy needs: Bedrest Antibiotic de-escalation: No antibiotics Home medication reconciliation: On hold Daily labs: CBC, BMP daily. Code Status: Full code, palliative care following Family Communication: Family updated at bedside, palliative care is following Disposition: ICU  Critical care time: 40 min.    Kipp Brood, MD [facility] ICU Physician [place]  Pager: [phone] Or Epic Secure Chat After hours: [phone].  08/05/2020, 12:27 PM

## 2020-08-05 NOTE — Progress Notes (Signed)
[facility] ADULT ICU REPLACEMENT PROTOCOL   The patient does apply for the [facility] Adult ICU Electrolyte Replacment Protocol based on the criteria listed below:   1. Is GFR >/= 30 ml/min? Yes.    Patient's GFR today is 40 2. Is SCr </= 2? Yes.   Patient's SCr is 1.33 ml/kg/hr 3. Did SCr increase >/= 0.5 in 24 hours? No. 4. Abnormal electrolyte(s):k 3.4 5. Ordered repletion with: protocol 6. If a panic level lab has been reported, has the CCM MD in charge been notified? No..   Physician:    Ronda Fairly A 08/05/2020 6:09 AM

## 2020-08-05 NOTE — Procedures (Signed)
Extubation Procedure Note  Patient Details:   Name: Cheryl Vargas DOB: 1935-09-05 MRN: [id number]   Airway Documentation:    Vent end date: 08/05/20 Vent end time: 1118   Evaluation  O2 sats: stable throughout Complications: No apparent complications Patient did tolerate procedure well. Bilateral Breath Sounds: Clear   Yes   Patient extubated per MD order. Tolerated well. RT to monitor as needed.  Saunders Glance 08/05/2020, 11:20 AM

## 2020-08-05 NOTE — Consult Note (Signed)
Consultation Note Date: 08/05/2020   Patient Name: Cheryl Vargas  DOB: March 19, 1935  MRN: [id number]  Age / Sex: 85 y.o., female  PCP: Lavone Orn, MD Referring Physician: Larey Dresser, MD  Reason for Consultation: Establishing goals of care  HPI/Patient Profile: 85 y.o. female  with past medical history of NICM, systolic CHF EF <18%, HTN, HLD, afib (on Xarelto), s/p Med[employer] CRT-D, CKD stage 3 admitted on 08/02/2020 with increased shortness of breath related to CHF exacerbation and worsening renal function. Hospitalization complicated by hemoptysis and has required intubation. Also requiring milrinone infusion. MOST form in records completed by outpatient palliative care for following: DNR, limited interventions (would consider ICU admission but no desire for intubation), ok with antibiotics, ok with tube feedings depending on long term prognosis.   Clinical Assessment and Goals of Care: I met today at Cheryl Vargas's bedside along with son, Cheryl Vargas. Tim reports understanding of plans and hopeful for extubation today. We discussed outpatient palliative notes and her wishes. He does report that she has been very clear about her wishes with family and his brother, Cheryl Vargas, is Economist and en-route to visit today from [place]. We spent some time discussing heart failure and expected trajectory of disease. Discussed the narrow window of balance with diuretics to protect renal function but offload excess fluid and this is a moving target. Discussed use of milrinone as an intervention to assist with quality of life and symptom management but does not reverse or repair her heart failure. He understands.   I returned to bedside and both sons, Cheryl Vargas and Cheryl Vargas, are present. Ms. Parkison is more alert on vent but communication is still difficult. I reviewed with family at bedside plans and previous discussions. Per son Ms. Muscato has  expressed that she would want CPR/defib but would not want intubation. I did spend time discussing with them that these interventions work together and if needing CPR then the lungs are not being perfused and would need to be supported as well. Ms. Haggard was making motions with her arms during this conversation. We decided that we would return tomorrow when Ms. Delano is hopefully successfully extubated to review her wishes. I believe speaking with the patient is best as our documented wishes in ACP link (on left window of Epic under code status) do not align with what family are telling me now. They agree to respect Ms. Greeley's wishes and that further discussion with her would be best.   All questions/concerns addressed. Emotional support provided.   Primary Decision Maker HCPOA son Cheryl Vargas    SUMMARY OF RECOMMENDATIONS   - Code status and GOC need clarification with patient when extubated and able to communicate her wishes herself.   Code Status/Advance Care Planning:  Full code - left full under patient can clarify   Symptom Management:   Per heart failure team and PCCM.   Palliative Prophylaxis:   Aspiration, Delirium Protocol and Frequent Pain Assessment  Additional Recommendations (Limitations, Scope, Preferences):  TBD  Psycho-social/Spiritual:   Desire for further  Chaplaincy support:no  Additional Recommendations: Caregiving  Support/Resources and Education on Hospice  Prognosis:   Overall prognosis poor with progressing heart failure.   Discharge Planning: To Be Determined      Primary Diagnoses: Present on Admission: . Acute on chronic systolic (congestive) heart failure ([place])   I have reviewed the medical record, interviewed the patient and family, and examined the patient. The following aspects are pertinent.  Past Medical History:  Diagnosis Date  . Arthritis   . Chronic combined systolic (congestive) and diastolic (congestive) heart failure ([place])   .  Mitral regurgitation   . Non-ischemic cardiomyopathy ([place])   . Persistent atrial fibrillation ([place])   . Ventricular tachycardia ([facility])    Social History   Socioeconomic History  . Marital status: Widowed    Spouse name: Not on file  . Number of children: Not on file  . Years of education: Not on file  . Highest education level: Not on file  Occupational History  . Not on file  Tobacco Use  . Smoking status: Never Smoker  . Smokeless tobacco: Never Used  Vaping Use  . Vaping Use: Never used  Substance and Sexual Activity  . Alcohol use: Yes    Alcohol/week: 3.0 standard drinks    Types: 3 Glasses of wine per week    Comment: per week  . Drug use: No  . Sexual activity: Not on file  Other Topics Concern  . Not on file  Social History Narrative  . Not on file   Social Determinants of Health   Financial Resource Strain:   . Difficulty of Paying Living Expenses: Not on file  Food Insecurity:   . Worried About Charity fundraiser in the Last Year: Not on file  . Ran Out of Food in the Last Year: Not on file  Transportation Needs:   . Lack of Transportation (Medical): Not on file  . Lack of Transportation (Non-Medical): Not on file  Physical Activity:   . Days of Exercise per Week: Not on file  . Minutes of Exercise per Session: Not on file  Stress:   . Feeling of Stress : Not on file  Social Connections:   . Frequency of Communication with Friends and Family: Not on file  . Frequency of Social Gatherings with Friends and Family: Not on file  . Attends Religious Services: Not on file  . Active Member of Clubs or Organizations: Not on file  . Attends Archivist Meetings: Not on file  . Marital Status: Not on file   Family History  Problem Relation Age of Onset  . Lung cancer Mother   . Heart attack Father    Scheduled Meds: . sodium chloride   Intravenous Once  . artificial tears  1 application Both Eyes N3Z  . chlorhexidine gluconate (MEDLINE KIT)  15  mL Mouth Rinse BID  . Chlorhexidine Gluconate Cloth  6 each Topical Daily  . docusate  100 mg Per Tube BID  . fentaNYL (SUBLIMAZE) injection  25 mcg Intravenous Once  . mouth rinse  15 mL Mouth Rinse 10 times per day  . midodrine  5 mg Per Tube TID WC  . pantoprazole sodium  40 mg Per Tube Daily  . polyethylene glycol  17 g Per Tube Daily  . potassium chloride  40 mEq Per Tube Once  . sodium chloride flush  3 mL Intravenous Q12H  . sodium chloride flush  3 mL Intravenous Q12H   Continuous Infusions: .  sodium chloride    . sodium chloride    . amiodarone 30 mg/hr (08/05/20 0700)  . fentaNYL infusion INTRAVENOUS 50 mcg/hr (08/04/20 0900)  . norepinephrine (LEVOPHED) Adult infusion 4 mcg/min (08/05/20 0700)  . propofol (DIPRIVAN) infusion 30 mcg/kg/min (08/05/20 0700)  . tranexamic acid     PRN Meds:.sodium chloride, Place/Maintain arterial line **AND** sodium chloride, acetaminophen, fentaNYL, fentaNYL (SUBLIMAZE) injection, fentaNYL (SUBLIMAZE) injection, LORazepam, midazolam, midazolam, rocuronium, sodium chloride flush, sodium chloride flush, zolpidem Allergies  Allergen Reactions  . Sotalol Other (See Comments)    Prolonged QTc  . Alendronate Sodium Other (See Comments)    Arm pain  . Ciprofloxacin Other (See Comments)    Not effective  . Compazine [Prochlorperazine]   . Coreg [Carvedilol] Other (See Comments)    Fatigue   . Metoprolol Other (See Comments)    Profound fatigue   Review of Systems  Unable to perform ROS: Intubated    Physical Exam Vitals and nursing note reviewed.  Constitutional:      General: She is not in acute distress.    Appearance: She is ill-appearing.     Interventions: She is intubated.     Comments: Frail   Cardiovascular:     Rate and Rhythm: Normal rate.     Comments: Amiodarone infusion Pulmonary:     Effort: No tachypnea, accessory muscle usage or respiratory distress. She is intubated.     Comments: Weaning well.  S/P bronch.    Abdominal:     General: Abdomen is flat.     Palpations: Abdomen is soft.  Neurological:     Mental Status: She is alert.     Comments: Nods head yes/no. Following commands.      Vital Signs: BP (!) 118/53   Pulse 82   Temp 97.7 F (36.5 C) (Axillary)   Resp 16   Ht '5\' 5"'  (1.651 m)   Wt 56.8 kg   SpO2 97%   BMI 20.84 kg/m  Pain Scale: CPOT POSS *See Group Information*: 1-Acceptable,Awake and alert Pain Score: 0-No pain   SpO2: SpO2: 97 % O2 Device:SpO2: 97 % O2 Flow Rate: .O2 Flow Rate (L/min): 2 L/min  IO: Intake/output summary:   Intake/Output Summary (Last 24 hours) at 08/05/2020 0746 Last data filed at 08/05/2020 0700 Gross per 24 hour  Intake 1507.69 ml  Output 1320 ml  Net 187.69 ml    LBM: Last BM Date: 08/02/20 Baseline Weight: Weight: 57 kg Most recent weight: Weight: 56.8 kg     Palliative Assessment/Data:     Time In/Out: 0352-4818, 1000-1020 Time Total: 50 min Greater than 50%  of this time was spent counseling and coordinating care related to the above assessment and plan.  Signed by: Vinie Sill, NP Palliative Medicine Team Pager # [phone] (M-F 8a-5p) Team Phone # [phone] (Nights/Weekends)

## 2020-08-05 NOTE — Procedures (Signed)
Bronchoscopy Procedure Note  Cheryl Vargas  [id number]  [birth year]  Date:08/05/20  Time:12:32 PM   Provider Performing:Jaiyanna Safran   Procedure(s):  Flexible Bronchoscopy [phone])  Indication(s) Follow-up examination for hemoptysis  Consent Risks of the procedure as well as the alternatives and risks of each were explained to the patient and/or caregiver.  Consent for the procedure was obtained and is signed in the bedside chart  Anesthesia Patient on fentanyl and will follow infusions   Time Out Verified patient identification, verified procedure, site/side was marked, verified correct patient position, special equipment/implants available, medications/allergies/relevant history reviewed, required imaging and test results available.   Sterile Technique Usual hand hygiene, masks, gowns, and gloves were used   Procedure Description Bronchoscope advanced through endotracheal tube and into airway.  Airways were examined down to subsegmental level with findings noted below.     Findings: Small amount of residual old blood.  No signs of any active bleeding   Complications/Tolerance None; patient tolerated the procedure well. Chest X-ray is not needed post procedure.   EBL Minimal   Specimen(s) None  Kipp Brood, MD [facility] ICU Physician [place]  Pager: [phone] Or Epic Secure Chat After hours: [phone].  08/05/2020, 12:33 PM

## 2020-08-06 ENCOUNTER — Encounter (HOSPITAL_COMMUNITY): Payer: Self-pay | Admitting: Interventional Radiology

## 2020-08-06 DIAGNOSIS — I5043 Acute on chronic combined systolic (congestive) and diastolic (congestive) heart failure: Secondary | ICD-10-CM | POA: Diagnosis not present

## 2020-08-06 DIAGNOSIS — I5023 Acute on chronic systolic (congestive) heart failure: Secondary | ICD-10-CM | POA: Diagnosis not present

## 2020-08-06 DIAGNOSIS — Z515 Encounter for palliative care: Secondary | ICD-10-CM | POA: Diagnosis not present

## 2020-08-06 DIAGNOSIS — R042 Hemoptysis: Secondary | ICD-10-CM | POA: Diagnosis not present

## 2020-08-06 LAB — BASIC METABOLIC PANEL
Anion gap: 13 (ref 5–15)
BUN: 20 mg/dL (ref 8–23)
CO2: 21 mmol/L — ABNORMAL LOW (ref 22–32)
Calcium: 7.9 mg/dL — ABNORMAL LOW (ref 8.9–10.3)
Chloride: 100 mmol/L (ref 98–111)
Creatinine, Ser: 1.17 mg/dL — ABNORMAL HIGH (ref 0.44–1.00)
GFR, Estimated: 46 mL/min — ABNORMAL LOW (ref >60)
Glucose, Bld: 109 mg/dL — ABNORMAL HIGH (ref 70–99)
Potassium: 3.2 mmol/L — ABNORMAL LOW (ref 3.5–5.1)
Sodium: 134 mmol/L — ABNORMAL LOW (ref 135–145)

## 2020-08-06 LAB — CBC
HCT: 39 % (ref 36.0–46.0)
Hemoglobin: 13.2 g/dL (ref 12.0–15.0)
MCH: 31.7 pg (ref 26.0–34.0)
MCHC: 33.8 g/dL (ref 30.0–36.0)
MCV: 93.5 fL (ref 80.0–100.0)
Platelets: 144 10*3/uL — ABNORMAL LOW (ref 150–400)
RBC: 4.17 MIL/uL (ref 3.87–5.11)
RDW: 13.5 % (ref 11.5–15.5)
WBC: 11.9 10*3/uL — ABNORMAL HIGH (ref 4.0–10.5)
nRBC: 0 % (ref 0.0–0.2)

## 2020-08-06 LAB — MAGNESIUM: Magnesium: 2.1 mg/dL (ref 1.7–2.4)

## 2020-08-06 LAB — COOXEMETRY PANEL
Carboxyhemoglobin: 1.1 % (ref 0.5–1.5)
Methemoglobin: 0.8 % (ref 0.0–1.5)
O2 Saturation: 63.9 %
Total hemoglobin: 13.4 g/dL (ref 12.0–16.0)

## 2020-08-06 LAB — APTT: aPTT: 76 seconds — ABNORMAL HIGH (ref 24–36)

## 2020-08-06 LAB — HEPARIN LEVEL (UNFRACTIONATED): Heparin Unfractionated: 0.52 IU/mL (ref 0.30–0.70)

## 2020-08-06 MED ORDER — MIDODRINE HCL 5 MG PO TABS
10.0000 mg | ORAL_TABLET | Freq: Three times a day (TID) | ORAL | Status: DC
  Administered 2020-08-06 – 2020-08-18 (×35): 10 mg via ORAL
  Filled 2020-08-06 (×37): qty 2

## 2020-08-06 MED ORDER — POTASSIUM CHLORIDE 10 MEQ/50ML IV SOLN
10.0000 meq | INTRAVENOUS | Status: AC
  Administered 2020-08-06 (×6): 10 meq via INTRAVENOUS
  Filled 2020-08-06 (×6): qty 50

## 2020-08-06 MED ORDER — HEPARIN (PORCINE) 25000 UT/250ML-% IV SOLN
400.0000 [IU]/h | INTRAVENOUS | Status: DC
  Administered 2020-08-06: 700 [IU]/h via INTRAVENOUS
  Administered 2020-08-08: 400 [IU]/h via INTRAVENOUS
  Filled 2020-08-06 (×2): qty 250

## 2020-08-06 MED ORDER — LORAZEPAM 0.5 MG PO TABS
0.5000 mg | ORAL_TABLET | Freq: Three times a day (TID) | ORAL | Status: DC | PRN
  Administered 2020-08-06: 0.5 mg via ORAL
  Filled 2020-08-06: qty 1

## 2020-08-06 MED ORDER — BISOPROLOL FUMARATE 5 MG PO TABS: 2.5000 mg | ORAL_TABLET | Freq: Every day | ORAL | Status: DC

## 2020-08-06 NOTE — Evaluation (Signed)
Physical Therapy Evaluation Patient Details Name: Cheryl Vargas MRN: [id number] DOB: 10/13/1934 Today's Date: 08/06/2020   History of Present Illness  Pt is an 85 year old woman admtted on 08/04/20 with acute on chronic hear failure. Hospital course complicated by sudden massive hemoptysis during cardiac catherization on 11/24 requiring emergent intubation and bronchoscopy. Extubated 11/25. PMH: NICM with EF 20%, HTN, HLD, PAF.  Clinical Impression  Pt admitted with above diagnosis. Pt was able to ambulate with mod assist of 2 with RW with max cues for safety due to posterior lean with balance deficits noted. Pt with flat affect.  Pt will need SNF to gain strength prior to d/c home. Will follow acutely. Pt currently with functional limitations due to the deficits listed below (see PT Problem List). Pt will benefit from skilled PT to increase their independence and safety with mobility to allow discharge to the venue listed below.      Follow Up Recommendations SNF;Supervision/Assistance - 24 hour    Equipment Recommendations  None recommended by PT    Recommendations for Other Services       Precautions / Restrictions Precautions Precautions: Fall Precaution Comments: watch 02 Restrictions Weight Bearing Restrictions: No      Mobility  Bed Mobility               General bed mobility comments: Pt on 3N1 on arrival.      Transfers Overall transfer level: Needs assistance Equipment used: Rolling walker (2 wheeled) Transfers: Sit to/from Stand Sit to Stand: Mod assist;+2 safety/equipment         General transfer comment: Assist with mod assist to power up and maintain balance due to posterior lean.    Ambulation/Gait Ambulation/Gait assistance: Min assist;+2 safety/equipment;Mod assist Gait Distance (Feet): 70 Feet (35 x 2) Assistive device: Rolling walker (2 wheeled) Gait Pattern/deviations: Step-to pattern;Shuffle;Leaning posteriorly;Decreased step length -  right;Decreased step length - left;Trunk flexed;Narrow base of support;Drifts right/left   Gait velocity interpretation: <1.31 ft/sec, indicative of household ambulator General Gait Details: Pt unsteady gait needing mod to min assist for balance with posterior lean needing constant support to lean forward.  Had to steer RW for pt and cues tostay close to rW. Also cued for pursed lip breathing as pt desat to 82% on 4LO2.  99% O2 at rest on 4L.  Encouraged incentive spirometer.   Stairs            Wheelchair Mobility    Modified Rankin (Stroke Patients Only)       Balance                                             Pertinent Vitals/Pain Pain Assessment: No/denies pain    Home Living Family/patient expects to be discharged to:: Private residence Living Arrangements: Alone Available Help at Discharge: Family;Available PRN/intermittently Type of Home: Apartment Home Access: Level entry     Home Layout: One level Home Equipment: Shower seat;Hand held Tourist information centre manager - 4 wheels;Toilet riser;Grab bars - toilet;Walker - 2 wheels;Cane - single point      Prior Function Level of Independence: Independent with assistive device(s)         Comments: uses rollator or cane when needed, stands to shower     Hand Dominance   Dominant Hand: Right    Extremity/Trunk Assessment   Upper Extremity Assessment Upper Extremity Assessment: Defer to  OT evaluation    Lower Extremity Assessment Lower Extremity Assessment: Generalized weakness    Cervical / Trunk Assessment Cervical / Trunk Assessment: Kyphotic  Communication   Communication: No difficulties  Cognition Arousal/Alertness: Awake/alert Behavior During Therapy: Flat affect Overall Cognitive Status: Difficult to assess                                        General Comments General comments (skin integrity, edema, etc.): 78-90 bpm, 91/67 at end of treatment.     Exercises  General Exercises - Lower Extremity Ankle Circles/Pumps: AROM;Both;10 reps;Supine Quad Sets: AROM;Both;10 reps;Supine Gluteal Sets: AROM;Both;10 reps;Supine Long Arc Quad: AROM;Both;10 reps;Seated   Assessment/Plan    PT Assessment Patient needs continued PT services  PT Problem List Decreased activity tolerance;Decreased balance;Decreased mobility;Decreased knowledge of use of DME;Decreased safety awareness;Decreased knowledge of precautions;Cardiopulmonary status limiting activity       PT Treatment Interventions DME instruction;Gait training;Functional mobility training;Therapeutic activities;Therapeutic exercise;Balance training;Patient/family education    PT Goals (Current goals can be found in the Care Plan section)  Acute Rehab PT Goals Patient Stated Goal: to go home PT Goal Formulation: With patient Time For Goal Achievement: 08/20/20 Potential to Achieve Goals: Good    Frequency Min 3X/week   Barriers to discharge Decreased caregiver support (Rehab and then son will come stay with mom for a bit)      Co-evaluation PT/OT/SLP Co-Evaluation/Treatment: Yes Reason for Co-Treatment: For patient/therapist safety PT goals addressed during session: Mobility/safety with mobility         AM-PAC PT "6 Clicks" Mobility  Outcome Measure Help needed turning from your back to your side while in a flat bed without using bedrails?: A Lot Help needed moving from lying on your back to sitting on the side of a flat bed without using bedrails?: A Lot Help needed moving to and from a bed to a chair (including a wheelchair)?: A Lot Help needed standing up from a chair using your arms (e.g., wheelchair or bedside chair)?: A Lot Help needed to walk in hospital room?: A Lot Help needed climbing 3-5 steps with a railing? : A Lot 6 Click Score: 12    End of Session Equipment Utilized During Treatment: Gait belt;Oxygen Activity Tolerance: Patient tolerated treatment well Patient left: in  chair;with call bell/phone within reach;with chair alarm set;with family/visitor present Nurse Communication: Mobility status PT Visit Diagnosis: Muscle weakness (generalized) (M62.81);Unsteadiness on feet (R26.81)    Time: 5329-9242 PT Time Calculation (min) (ACUTE ONLY): 25 min   Charges:   PT Evaluation $PT Eval Moderate Complexity: 1 Mod          Bayler Gehrig W,PT Acute Rehabilitation Services Pager:  [phone]  Office:  [place] 08/06/2020, 11:17 AM

## 2020-08-06 NOTE — Progress Notes (Signed)
Referring Physician(s): * No referring provider recorded for this case *  Supervising Physician: Dr. Serafina Royals   Patient Status:  [name] - In-pt  Chief Complaint: Advanced HF with severe valvular disease  Subjective: Resting comfortably in bed.  Extubated, conversant.  With several questions about medical status and possible future care/needs/interventions- likely processing from recent visit with PMT.  Son at bedside assisting to reorient.   Allergies: Sotalol, Alendronate sodium, Compazine [prochlorperazine], Ciprofloxacin, Coreg [carvedilol], and Metoprolol  Medications: Prior to Admission medications   Medication Sig Start Date End Date Taking? Authorizing Provider  acetaminophen (TYLENOL) 500 MG tablet Take 500 mg by mouth every 6 (six) hours as needed for headache.   Yes [provider]  bisoprolol (ZEBETA) 5 MG tablet Take 2.5 mg by mouth daily.    Yes [provider]  calcium carbonate (TUMS - DOSED IN MG ELEMENTAL CALCIUM) 500 MG chewable tablet Chew 1 tablet by mouth daily as needed for indigestion or heartburn.   Yes [provider]  empagliflozin (JARDIANCE) 10 MG TABS tablet Take 1 tablet (10 mg total) by mouth daily before breakfast. 07/06/20  Yes Larey Dresser, MD  fluticasone [facility]) 50 MCG/ACT nasal spray Place 1 spray into both nostrils daily as needed for allergies or rhinitis.    Yes [provider]  furosemide (LASIX) 20 MG tablet Take 2 tablets (40 mg total) by mouth every morning AND 1 tablet (20 mg total) every evening. Patient taking differently: 1 tablet 20mg  orally daily 07/21/20  Yes Larey Dresser, MD  LORazepam (ATIVAN) 0.5 MG tablet Take 1 tablet (0.5 mg total) by mouth daily as needed (nausea). 02/06/20  Yes Bonnielee Haff, MD  potassium chloride SA (KLOR-CON M20) 20 MEQ tablet Take 1 tablet (20 mEq total) by mouth daily. 05/14/20 08/12/20 Yes Larey Dresser, MD  Rivaroxaban (XARELTO) 15 MG TABS tablet Take 1  tablet (15 mg total) by mouth daily with supper. 12/11/19  Yes Larey Dresser, MD  sennosides-docusate sodium (SENOKOT-S) 8.6-50 MG tablet Take 1 tablet by mouth as needed for constipation.   Yes [provider]  zolpidem (AMBIEN) 5 MG tablet Take 1 tablet (5 mg total) by mouth at bedtime. Patient taking differently: Take 7.5 mg by mouth at bedtime.  02/06/20  Yes Bonnielee Haff, MD     Vital Signs: BP 109/66   Pulse 79   Temp (!) 97.5 F (36.4 C)   Resp (!) 22   Ht 5\' 5"  (1.651 m)   Wt 124 lb 12.5 oz (56.6 kg)   SpO2 94%   BMI 20.76 kg/m   Physical Exam  NAD, alert Chest: no hemoptysis, no increased work of breathing. Procedure site intact.    Imaging: DG Abd 1 View  Result Date: 08/04/2020 CLINICAL DATA:  OG tube placement EXAM: ABDOMEN - 1 VIEW COMPARISON:  December 19, 2017 and CT of the abdomen and pelvis from Feb 03, 2020 FINDINGS: Single abdominal radiograph excludes pelvic area. Dual lead pacer defibrillator in place with signs of interval coil embolization about the RIGHT chest. No lobar consolidation or effusion at the lung bases. Gastric tube likely in distal stomach. Side port visualized. Tip off the field of view. Signs of vascular disease. On limited assessment no acute skeletal process. IMPRESSION: Interval coil embolization in the RIGHT chest. Gastric tube in the stomach, tip in the distal stomach. Electronically Signed   By: Zetta Bills M.D.   On: 08/04/2020 16:25   IR Angiogram Pulmonary Right Selective  Result Date: 08/04/2020 INDICATION: Right heart catheterization complicated by pulmonary arterial bleed, hemoptysis, pseudoaneurysm identified on imaging. EXAM: RIGHT PULMONARY ARTERIOGRAM WITH CTA ARTERIAL EMBOLIZATION COMPARISON:  cardiac catheterization images from earlier the same day MEDICATIONS: No antibiotics were indicated ANESTHESIA/SEDATION: Patient arrived and remained intubated under care of department of anesthesia FLUOROSCOPY TIME:  15 minute  54 seconds; 169 mGy COMPLICATIONS: None immediate. TECHNIQUE: Emergency consent was obtained from Dr. Aundra Dubin after a thorough discussion of the procedural risks, benefits and alternatives. All questions were addressed. Maximal Sterile Barrier Technique was utilized including caps, mask, sterile gowns, sterile gloves, sterile drape, hand hygiene and skin antiseptic. A timeout was performed prior to the initiation of the procedure. The previously placed right brachial venous sheath and surrounding skin was prepped with chlorhexidine, draped in usual sterile fashion. A 018 guidewire was advanced through the previously placed right pulmonary artery catheter which was exchanged for a 5 [place] vert catheter. Through this, the guidewire was upsized with a 035 Amplatz and the sheath was exchanged for a long 8 French sheath, placed to the main pulmonary artery. The vert catheter was advanced to the right lower lobe pulmonary artery for rotational pulmonary arteriography with 3D reconstruction. After the lesion localized, the feeding branch was selectively catheterized. After selective confirmatory arteriography, the affected branch was embolized with 5 and 6 mm coils. A coaxial 018 microcatheter was also required due to the diameter of the smaller coils. After final arteriography, catheter and sheath were removed and hemostasis achieved at the site. The patient tolerated the procedure well. FINDINGS: Rotational angiography demonstrated persistent 1.7 cm pseudoaneurysm just beyond the bifurcation of the anterior basal segment branch right lower lobe pulmonary artery, a short distance from the interlobar artery. Due to the diminutive size of the vessel distal to the lesion as well as the proximity of the lesion to the bifurcation of the anterior basal segment right lower lobe artery, coil embolization was selected in lieu of attempted covered stent deployment. Multiple 5 and 6 mm interlock coils were advanced into the  pseudoaneurysm and involved vessel both proximal and distal to the defect, with the aid of a coaxial microcatheter. This did require coil placement across the bifurcation of the feeding vessel proximal to the lesion. Follow-up arteriography demonstrates successful cessation of flow into the feeding branch with no further opacification of the pseudoaneurysm. No evidence of continued active hemorrhage. IMPRESSION: 1. 1.7 cm pseudoaneurysm involving anterior basal segment branch right lower lobe pulmonary artery. 2. Technically successful subselective coil embolization with cessation of flow into the pseudoaneurysm. Electronically Signed   By: Lucrezia Europe M.D.   On: 08/04/2020 16:55   CARDIAC CATHETERIZATION  Result Date: 08/04/2020 Branch of right pulmonary artery appears to have ruptured with procedure with hemoptysis.  See procedure note, patient intubated and pulmonary angiogram done.  Patient taken to IR for attempted intervetion. Family (son and daughter) updated.   IR [facility]  Result Date: 08/04/2020 INDICATION: Right heart catheterization complicated by pulmonary arterial bleed, hemoptysis, pseudoaneurysm identified on imaging. EXAM: RIGHT PULMONARY ARTERIOGRAM WITH CTA ARTERIAL EMBOLIZATION COMPARISON:  cardiac catheterization images from earlier the same day MEDICATIONS: No antibiotics were indicated ANESTHESIA/SEDATION: Patient arrived and remained intubated under care of department of anesthesia FLUOROSCOPY TIME:  15 minute 54 seconds; 678 mGy COMPLICATIONS: None immediate. TECHNIQUE: Emergency consent was obtained from Dr. Aundra Dubin after a thorough discussion of the procedural risks, benefits and alternatives. All questions were addressed. Maximal Sterile Barrier Technique was utilized including caps, mask, sterile gowns,  sterile gloves, sterile drape, hand hygiene and skin antiseptic. A timeout was performed prior to the initiation of the procedure. The previously placed right brachial venous  sheath and surrounding skin was prepped with chlorhexidine, draped in usual sterile fashion. A 018 guidewire was advanced through the previously placed right pulmonary artery catheter which was exchanged for a 5 [place] vert catheter. Through this, the guidewire was upsized with a 035 Amplatz and the sheath was exchanged for a long 8 French sheath, placed to the main pulmonary artery. The vert catheter was advanced to the right lower lobe pulmonary artery for rotational pulmonary arteriography with 3D reconstruction. After the lesion localized, the feeding branch was selectively catheterized. After selective confirmatory arteriography, the affected branch was embolized with 5 and 6 mm coils. A coaxial 018 microcatheter was also required due to the diameter of the smaller coils. After final arteriography, catheter and sheath were removed and hemostasis achieved at the site. The patient tolerated the procedure well. FINDINGS: Rotational angiography demonstrated persistent 1.7 cm pseudoaneurysm just beyond the bifurcation of the anterior basal segment branch right lower lobe pulmonary artery, a short distance from the interlobar artery. Due to the diminutive size of the vessel distal to the lesion as well as the proximity of the lesion to the bifurcation of the anterior basal segment right lower lobe artery, coil embolization was selected in lieu of attempted covered stent deployment. Multiple 5 and 6 mm interlock coils were advanced into the pseudoaneurysm and involved vessel both proximal and distal to the defect, with the aid of a coaxial microcatheter. This did require coil placement across the bifurcation of the feeding vessel proximal to the lesion. Follow-up arteriography demonstrates successful cessation of flow into the feeding branch with no further opacification of the pseudoaneurysm. No evidence of continued active hemorrhage. IMPRESSION: 1. 1.7 cm pseudoaneurysm involving anterior basal segment branch right  lower lobe pulmonary artery. 2. Technically successful subselective coil embolization with cessation of flow into the pseudoaneurysm. Electronically Signed   By: Lucrezia Europe M.D.   On: 08/04/2020 16:55   ECHOCARDIOGRAM COMPLETE  Result Date: 08/02/2020    ECHOCARDIOGRAM REPORT   Patient Name:   Cheryl Vargas Date of Exam: 08/02/2020 Medical Rec #:  [id number]       Height:       65.0 in Accession #:    [id number]      Weight:       125.6 lb Date of Birth:  18-Aug-1935      BSA:          1.623 m Patient Age:    46 years        BP:           101/74 mmHg Patient Gender: F               HR:           81 bpm. Exam Location:  Inpatient Procedure: 2D Echo, Cardiac Doppler and Color Doppler Indications:    CHF  History:        Patient has prior history of Echocardiogram examinations, most                 recent 09/18/2019. CHF, Defibrillator, Arrythmias:Atrial                 Fibrillation; Risk Factors:Hypertension and Dyslipidemia.  Sonographer:    Clayton Lefort RDCS (AE) Referring Phys: [phone] [name] D CLEGG IMPRESSIONS  1. Left ventricular ejection fraction, by estimation, is 20  to 25%. The left ventricle has severely decreased function. The left ventricle demonstrates global hypokinesis. There is mild left ventricular hypertrophy. Left ventricular diastolic parameters  are indeterminate.  2. Right ventricular systolic function is low normal. The right ventricular size is normal. A n AICD wire is visualized. There is mildly elevated pulmonary artery systolic pressure. The estimated right ventricular systolic pressure is 16.1 mmHg.  3. Left atrial size was severely dilated.  4. Right atrial size was severely dilated.  5. The pericardial effusion is posterior to the left ventricle.  6. The mitral valve is abnormal. Severe mitral valve regurgitation.  7. The tricuspid valve is abnormal. Tricuspid valve regurgitation is severe.  8. The aortic valve is abnormal. Aortic valve regurgitation is moderate. Mild aortic valve sclerosis  is present, with no evidence of aortic valve stenosis.  9. The inferior vena cava is normal in size with <50% respiratory variability, suggesting right atrial pressure of 8 mmHg. Comparison(s): Changes from prior study are noted. 09/18/2019: LVEF <20%, severe biatrial enlargment, mild to moderate MR, mild AI. FINDINGS  Left Ventricle: Left ventricular ejection fraction, by estimation, is 20 to 25%. The left ventricle has severely decreased function. The left ventricle demonstrates global hypokinesis. The left ventricular internal cavity size was normal in size. There is mild left ventricular hypertrophy. Left ventricular diastolic parameters are indeterminate. Right Ventricle: The right ventricular size is normal. No increase in right ventricular wall thickness. Right ventricular systolic function is low normal. There is mildly elevated pulmonary artery systolic pressure. The tricuspid regurgitant velocity is 2.78 m/s, and with an assumed right atrial pressure of 8 mmHg, the estimated right ventricular systolic pressure is 09.6 mmHg. Left Atrium: Left atrial size was severely dilated. Right Atrium: Right atrial size was severely dilated. Pericardium: Trivial pericardial effusion is present. The pericardial effusion is posterior to the left ventricle. Mitral Valve: The mitral valve is abnormal. There is moderate thickening of the mitral valve leaflet(s). Severe mitral valve regurgitation, with centrally-directed jet. Tricuspid Valve: The tricuspid valve is abnormal. Tricuspid valve regurgitation is severe. Aortic Valve: The aortic valve is abnormal. Aortic valve regurgitation is moderate. Aortic regurgitation PHT measures 350 msec. Mild aortic valve sclerosis is present, with no evidence of aortic valve stenosis. Pulmonic Valve: The pulmonic valve was grossly normal. Pulmonic valve regurgitation is mild. Aorta: The aortic root and ascending aorta are structurally normal, with no evidence of dilitation. Venous: The  inferior vena cava is normal in size with less than 50% respiratory variability, suggesting right atrial pressure of 8 mmHg. IAS/Shunts: No atrial level shunt detected by color flow Doppler. Additional Comments: A n AICD wire is visualized.  LEFT VENTRICLE PLAX 2D LVIDd:         5.30 cm LVIDs:         5.00 cm LV PW:         1.20 cm LV IVS:        1.10 cm LVOT diam:     1.80 cm LV SV:         19 LV SV Index:   11 LVOT Area:     2.54 cm  LV Volumes (MOD) LV vol d, MOD A2C: 137.0 ml LV vol d, MOD A4C: 112.0 ml LV vol s, MOD A2C: 91.9 ml LV vol s, MOD A4C: 91.7 ml LV SV MOD A2C:     45.1 ml LV SV MOD A4C:     112.0 ml LV SV MOD BP:      31.8 ml RIGHT  VENTRICLE             IVC RV Basal diam:  3.60 cm     IVC diam: 1.70 cm RV Mid diam:    2.70 cm RV S prime:     10.00 cm/s TAPSE (M-mode): 1.9 cm LEFT ATRIUM              Index       RIGHT ATRIUM           Index LA diam:        4.90 cm  3.02 cm/m  RA Area:     27.30 cm LA Vol (A2C):   126.0 ml 77.62 ml/m RA Volume:   97.30 ml  59.94 ml/m LA Vol (A4C):   86.1 ml  53.04 ml/m LA Biplane Vol: 105.0 ml 64.68 ml/m  AORTIC VALVE LVOT Vmax:   49.73 cm/s LVOT Vmean:  32.367 cm/s LVOT VTI:    0.073 m AI PHT:      350 msec  AORTA Ao Root diam: 3.00 cm MR Peak grad:    79.6 mmHg   TRICUSPID VALVE MR Mean grad:    47.0 mmHg   TR Peak grad:   30.9 mmHg MR Vmax:         446.00 cm/s TR Vmax:        278.00 cm/s MR Vmean:        315.0 cm/s MR PISA:         1.57 cm    SHUNTS MR PISA Eff ROA: 14 mm      Systemic VTI:  0.07 m MR PISA Radius:  0.50 cm     Systemic Diam: 1.80 cm Lyman Bishop MD Electronically signed by Lyman Bishop MD Signature Date/Time: 08/02/2020/5:00:11 PM    Final    IR EMBO ART  VEN HEMORR LYMPH EXTRAV  INC GUIDE ROADMAPPING  Result Date: 08/04/2020 INDICATION: Right heart catheterization complicated by pulmonary arterial bleed, hemoptysis, pseudoaneurysm identified on imaging. EXAM: RIGHT PULMONARY ARTERIOGRAM WITH CTA ARTERIAL EMBOLIZATION COMPARISON:   cardiac catheterization images from earlier the same day MEDICATIONS: No antibiotics were indicated ANESTHESIA/SEDATION: Patient arrived and remained intubated under care of department of anesthesia FLUOROSCOPY TIME:  15 minute 54 seconds; 694 mGy COMPLICATIONS: None immediate. TECHNIQUE: Emergency consent was obtained from Dr. Aundra Dubin after a thorough discussion of the procedural risks, benefits and alternatives. All questions were addressed. Maximal Sterile Barrier Technique was utilized including caps, mask, sterile gowns, sterile gloves, sterile drape, hand hygiene and skin antiseptic. A timeout was performed prior to the initiation of the procedure. The previously placed right brachial venous sheath and surrounding skin was prepped with chlorhexidine, draped in usual sterile fashion. A 018 guidewire was advanced through the previously placed right pulmonary artery catheter which was exchanged for a 5 [place] vert catheter. Through this, the guidewire was upsized with a 035 Amplatz and the sheath was exchanged for a long 8 French sheath, placed to the main pulmonary artery. The vert catheter was advanced to the right lower lobe pulmonary artery for rotational pulmonary arteriography with 3D reconstruction. After the lesion localized, the feeding branch was selectively catheterized. After selective confirmatory arteriography, the affected branch was embolized with 5 and 6 mm coils. A coaxial 018 microcatheter was also required due to the diameter of the smaller coils. After final arteriography, catheter and sheath were removed and hemostasis achieved at the site. The patient tolerated the procedure well. FINDINGS: Rotational angiography demonstrated persistent 1.7 cm pseudoaneurysm just beyond the bifurcation of  the anterior basal segment branch right lower lobe pulmonary artery, a short distance from the interlobar artery. Due to the diminutive size of the vessel distal to the lesion as well as the proximity of  the lesion to the bifurcation of the anterior basal segment right lower lobe artery, coil embolization was selected in lieu of attempted covered stent deployment. Multiple 5 and 6 mm interlock coils were advanced into the pseudoaneurysm and involved vessel both proximal and distal to the defect, with the aid of a coaxial microcatheter. This did require coil placement across the bifurcation of the feeding vessel proximal to the lesion. Follow-up arteriography demonstrates successful cessation of flow into the feeding branch with no further opacification of the pseudoaneurysm. No evidence of continued active hemorrhage. IMPRESSION: 1. 1.7 cm pseudoaneurysm involving anterior basal segment branch right lower lobe pulmonary artery. 2. Technically successful subselective coil embolization with cessation of flow into the pseudoaneurysm. Electronically Signed   By: Lucrezia Europe M.D.   On: 08/04/2020 16:55    Labs:  CBC: Recent Labs    08/04/20 2156 08/05/20 0442 08/05/20 1229 08/06/20 0402  WBC 9.0 10.3 13.4* 11.9*  HGB 14.2 14.3 14.7 13.2  HCT 41.0 41.5 42.7 39.0  PLT 168 172 183 144*    COAGS: Recent Labs    09/28/19 0155 09/28/19 1218 09/29/19 0318  INR 1.5*  --   --   APTT 34 82* 165*    BMP: Recent Labs    04/08/20 1234 04/08/20 1234 04/19/20 1144 04/19/20 1144 05/20/20 1422 05/20/20 1422 05/25/20 1152 07/06/20 1139 08/04/20 0257 08/04/20 1025 08/04/20 1026 08/04/20 1137 08/05/20 0442 08/06/20 0402  NA 138   < > 140   < > 138  --  138   < > 137   < > 136 141 136 134*  K 3.6   < > 3.7   < > 3.9   < > 4.2   < > 2.9*   < > 3.4* 3.1* 3.4* 3.2*  CL 103   < > 101   < > 105   < > 100   < > 99  --  100  --  103 100  CO2 25   < > 27   < > 19*   < > 24   < > 24  --  24  --  19* 21*  GLUCOSE 132*   < > 90   < > 123*  --  104*   < > 94  --  144*  --  117* 109*  BUN 22   < > 20   < > 23  --  18   < > 38*  --  35*  --  28* 20  CALCIUM 8.8*   < > 9.0   < > 9.0   < > 9.0   < > 8.4*  --   7.9*  --  8.2* 7.9*  CREATININE 1.13*   < > 1.16*   < > 1.20*   < > 1.06*   < > 1.62*  --  1.71*  --  1.33* 1.17*  GFRNONAA 45*   < > 44*   < > 42*   < > 49*   < > 31*  --  29*  --  40* 46*  GFRAA 52*  --  51*  --  49*  --  57*  --   --   --   --   --   --   --    < > =  values in this interval not displayed.    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 Vernard Gambles 11/24.  Patient improving. She has been extubated and is breathing/conversing well.  Recent meeting with PMT to discussion ongoing care and severe illness.   No hemoptysis.  Occasionally coughing up old bloody secretions.  Hgb 13.2 Son at bedside.  All questions answered.  No further needs in IR.   Electronically Signed: Docia Barrier, PA 08/06/2020, 3:41 PM   I spent a total of 15 Minutes at the the patient's bedside AND on the patient's hospital floor or unit, greater than 50% of which was counseling/coordinating care for pulmonary artery bleed.

## 2020-08-06 NOTE — Progress Notes (Signed)
Patient ID: Cheryl Vargas, female   DOB: 1935-03-10, 85 y.o.   MRN: [id number]       Advanced Heart Failure Rounding Note  PCP-Cardiologist: [name], MD   Subjective:    Had localized pulmonary artery perforation during cath on 11/24. Intubated and underwent successful coil embolization with IR. Had VT x 1 during cath and shocked.   Extubated 11/25. No further hemoptysis. On NE 3 and midodrine 5 tid. Co-ox 64%. Hgb 13.2. Creatinine improved to 1.17. SBP was 79 last night. This am SBP 110-120. CVP 8   - ECHO EF 20-25% RV normal, biatrial enlargement, IVC normal.   - RHC 11/24  Procedural Findings (incomplete study due to emergency): Hemodynamics (mmHg) RA mean 6 PA 41/16, mean 26 PCWP mean 19 Oxygen saturations: PA 57% AO 93% Cardiac Output (Fick) 2.92  Cardiac Index (Fick) 1.8    Objective:   Weight Range: 56.6 kg Body mass index is 20.76 kg/m.   Vital Signs:   Temp:  [97.5 F (36.4 C)-99.3 F (37.4 C)] 97.5 F (36.4 C) (11/26 0801) Pulse Rate:  [76-101] 81 (11/26 0600) Resp:  [12-23] 22 (11/26 0600) BP: (79-121)/(42-63) 113/55 (11/26 0600) SpO2:  [91 %-100 %] 97 % (11/26 0600) Arterial Line BP: (83-120)/(37-53) 90/41 (11/25 1700) FiO2 (%):  [40 %] 40 % (11/25 0842) Weight:  [56.6 kg] 56.6 kg (11/26 0500) Last BM Date: 08/02/20  Weight change: Filed Weights   08/03/20 0400 08/05/20 0500 08/06/20 0500  Weight: 56.6 kg 56.8 kg 56.6 kg    Intake/Output:   Intake/Output Summary (Last 24 hours) at 08/06/2020 0804 Last data filed at 08/06/2020 0600 Gross per 24 hour  Intake 1308.17 ml  Output 620 ml  Net 688.17 ml      Physical Exam    General:  Elderly woman. Lying in bed. No resp difficulty HEENT: normal Neck: supple. no JVD. Carotids 2+ bilat; no bruits. No lymphadenopathy or thryomegaly appreciated. Cor: PMI nondisplaced. Regular rate & rhythm. No rubs, gallops or murmurs. Lungs: faint crackles at right base Abdomen: soft, nontender,  nondistended. No hepatosplenomegaly. No bruits or masses. Good bowel sounds. Extremities: no cyanosis, clubbing, rash, edema Neuro: alert & orientedx3, cranial nerves grossly intact. moves all 4 extremities w/o difficulty. Affect pleasant   Telemetry   BiVpaced 80 underlying AF Personally reviewed  Labs    CBC Recent Labs    08/05/20 1229 08/06/20 0402  WBC 13.4* 11.9*  HGB 14.7 13.2  HCT 42.7 39.0  MCV 90.9 93.5  PLT 183 824*   Basic Metabolic Panel Recent Labs    08/05/20 0442 08/06/20 0402  NA 136 134*  K 3.4* 3.2*  CL 103 100  CO2 19* 21*  GLUCOSE 117* 109*  BUN 28* 20  CREATININE 1.33* 1.17*  CALCIUM 8.2* 7.9*   Liver Function Tests No results for input(s): AST, ALT, ALKPHOS, BILITOT, PROT, ALBUMIN in the last 72 hours. No results for input(s): LIPASE, AMYLASE in the last 72 hours. Cardiac Enzymes No results for input(s): CKTOTAL, CKMB, CKMBINDEX, TROPONINI in the last 72 hours.  BNP: BNP (last 3 results) Recent Labs    09/27/19 1708 08/02/20 0832  BNP 1,032.7* 1,285.0*    ProBNP (last 3 results) No results for input(s): PROBNP in the last 8760 hours.   D-Dimer No results for input(s): DDIMER in the last 72 hours. Hemoglobin A1C No results for input(s): HGBA1C in the last 72 hours. Fasting Lipid Panel Recent Labs    08/05/20 0442  TRIG 101  Thyroid Function Tests No results for input(s): TSH, T4TOTAL, T3FREE, THYROIDAB in the last 72 hours.  Invalid input(s): FREET3  Other results:   Imaging    No results found.   Medications:     Scheduled Medications: . Chlorhexidine Gluconate Cloth  6 each Topical Daily  . docusate sodium  100 mg Oral BID  . midodrine  5 mg Oral TID WC  . pantoprazole  40 mg Oral Daily  . polyethylene glycol  17 g Oral Daily    Infusions: . sodium chloride Stopped (08/05/20 2105)  . amiodarone 30 mg/hr (08/06/20 0600)  . norepinephrine (LEVOPHED) Adult infusion 3 mcg/min (08/06/20 0600)  .  potassium chloride 10 mEq (08/06/20 0702)    PRN Medications: Place/Maintain arterial line **AND** sodium chloride, acetaminophen, fentaNYL, LORazepam, phenol, sodium chloride flush, zolpidem    Patient Profile   Cheryl Vargas is an 85 year old with a history ofHTN, HLD, PAF (On Xarelto), and nonischemic cardiomyopathy with chronic systolic CHF. Cardiomyopathy dates back to at least 2014. Most recent ECHO 09/2019 showed EF < 20%.  Intolerant ranolazine, losartan,spironolactone, amiodarone, sotolol, and tikosyn.  Admitted with presyncope/volume overload.   Assessment/Plan   1. Hemoptysis: Due to small, isolated right PA branch rupture on 11/24. She was intubated and underwent bronchoscopy with clearing of blood. Suspect bleeding contained quickly. S/p coiling PA branch in IR 11/24 - Hgb stable at 14.3 -> 13.2 2. Acute on chronic systolic HF: [place] 02/2951 showed no significant coronary disease. Cardiac MRI 10/17 EF 36%, diffuse HK, normal RV, biatrial enlargement, moderate MR, non-coronary LGE pattern involving the mid-wall of the basal to mid septum and inferior wall. Possibly prior myocarditis versus a form of infiltrative disease. [employer] CRT-D s/p AV nodal ablation. Echo in 1/21 showed EF <20% with moderate LV dilation and mildly decreased RV systolic function. Low output on 1/21 RHC (CI 1.72). With age and frailty, LVAD not thought to be good option. At last appointment, she was not BiV pacing at an ideal percentage, likely due to PVCs. Symptomatically worse, NYHA class IIIb since decreasing Lasix in setting of rising creatinine. Low output HF noted this admission as well, CI on RHC today was 1.8.  EP has seen, CRT device adjusted and QRS now narrower.  Echo this admission with EF 20-25%, RV low normal systolic function, severe MR and TR.  She diuresed well yesterday, PCWP 19 on RHC today.  As above, has been intubated/sedated with pulmonary hemorrhage post-RHC.  Remains on  low-dose NE. Co-ox 64% - Maintain MAP 70 as needed with NE. We will try to wean today. Can increase midodrine as needed. - Once BP stable can restart milrinone - On midodrine 5 mg tid. As above, increase as needed  -Hold bisoprolol as retrying amiodarone as below.  -Off digoxin with persistently elevated level despite cutting back to 0.0625 mg qod. - Concerned that frequent PVCs may be affecting function (and also limiting BiV pacing percentage) => Retrying her on amiodarone. Toelrating so far.  - She does not qualify for Batwire given age - We had planned to discuss home milrinone for end stage low output HF versus home with hospice alone.  These discussions will be deferred for now until she is stabilized. However, I worry that even if she recovers from the current event, she will be much more debilitated.  3. Atrial fibrillation: Chronic, s/p AV nodal ablation and BiV pacing.  - Hold Xarelto with pulmonary hemorrhage.  Can restart heparin at low dose. Personally reviewed 4.  PVCs/VT: Frequent PVCs have limited BiV pacing. Not a good PVC ablation candidate per EP. Had VT in 1/21. Cannot tolerate amiodarone, sotolol, or Tikosyn. Did not tolerate mexilitine with nausea. Had nausea also with ranolazine. Additionally, did not tolerate further uptitration of bisoprolol.  VT today in setting of low oxygen saturation.  - switch amio to po 5. Mitral regurgitation: I reviewed her echo.  At least 3+ MR, suspect functional due to dilated annulus with severely dilated LA.  TR appears worse (clearly severe).  Discussed with Dr. Burt Knack, do not think that she would be a good Mitraclip candidate as TR is clearly worse than MR.  6. AKI on CKD stage 3: Creatinine 1.1 today, stable.  - Daily BMET 7. Acute hypoxemic respiratory failure: Intubated in setting of hemoptysis. CCM following.   - extubated 11/25 8. Hypokalemia - K 3.2 Supp K 9. Debility - PT/OT consult  Keep in ICU until off NE.   CRITICAL CARE  Performed by: Glori Bickers  Total critical care time: 35 minutes  Critical care time was exclusive of separately billable procedures and treating other patients.  Critical care was necessary to treat or prevent imminent or life-threatening deterioration.  Critical care was time spent personally by me on the following activities: development of treatment plan with patient and/or surrogate as well as nursing, discussions with consultants, evaluation of patient's response to treatment, examination of patient, obtaining history from patient or surrogate, ordering and performing treatments and interventions, ordering and review of laboratory studies, ordering and review of radiographic studies, pulse oximetry and re-evaluation of patient's condition.   Quillian Quince Narjis Mira 08/06/2020 8:04 AM

## 2020-08-06 NOTE — Progress Notes (Signed)
[facility] ADULT ICU REPLACEMENT PROTOCOL   The patient does apply for the [facility] Adult ICU Electrolyte Replacment Protocol based on the criteria listed below:   1. Is GFR >/= 30 ml/min? Yes.    Patient's GFR today is 46 2. Is SCr </= 2? Yes.   Patient's SCr is 1.17 ml/kg/hr 3. Did SCr increase >/= 0.5 in 24 hours? No. 4. Abnormal electrolyte(s): k 3.2 5. Ordered repletion with: protocol 6. If a panic level lab has been reported, has the CCM MD in charge been notified? No..   Physician:    Ronda Fairly A 08/06/2020 6:08 AM

## 2020-08-06 NOTE — Progress Notes (Signed)
NAME:  ROSY ESTABROOK, MRN:  [id number], DOB:  1935/08/22, LOS: 4 ADMISSION DATE:  08/02/2020, CONSULTATION DATE:  08/04/2020 REFERRING MD:  [place], CHIEF COMPLAINT:  Hemoptysis.   HPI/course in hospital  85 year old woman who experienced sudden massive hemoptysis following balloon inflation during pulmonary artery catheterization.  Known nonischemic cardiomyopathy with ejection fraction less than 20%, followed closely in heart failure clinic.  Ongoing episodes of presyncope with associated shortness of breath occurring with exertion.  Weight has been stable to decreasing.  Creatinine has increased.  Admitted to hospital and diuresed for possible volume overload. Underwent right heart catheterization for hemodynamic evaluation today.  Filling pressures did not appear particularly elevated. Unfortunately, patient developed gross hemoptysis immediately following balloon inflation.  Past Medical History   Past Medical History:  Diagnosis Date  . Arthritis   . Chronic combined systolic (congestive) and diastolic (congestive) heart failure ([place])   . Mitral regurgitation   . Non-ischemic cardiomyopathy ([place])   . Persistent atrial fibrillation ([place])   . Ventricular tachycardia [facility])      Past Surgical History:  Procedure Laterality Date  . AV NODE ABLATION N/A 09/07/2017   Procedure: AV NODE ABLATION;  Surgeon: [name], MD;  Location: [place] CV LAB;  Service: Cardiovascular;  Laterality: N/A;  . BIV ICD INSERTION CRT-D N/A 09/07/2017   Procedure: BIV ICD INSERTION CRT-D;  Surgeon: [name], MD;  Location: [place] CV LAB;  Service: Cardiovascular;  Laterality: N/A;  . BUNIONECTOMY    . CARDIAC CATHETERIZATION     in 2004 at [facility]. "Insignificant blockage" per patient  . CARDIAC CATHETERIZATION N/A 04/25/2016   Procedure: Left Heart Cath and Coronary Angiography;  Surgeon: Nelva Bush, MD;  Location: [place] CV LAB;  Service:  Cardiovascular;  Laterality: N/A;  . CARDIOVERSION N/A 06/29/2017   Procedure: CARDIOVERSION;  Surgeon: Larey Dresser, MD;  Location: [facility] ENDOSCOPY;  Service: Cardiovascular;  Laterality: N/A;  . CARDIOVERSION N/A 08/28/2017   Procedure: CARDIOVERSION;  Surgeon: Larey Dresser, MD;  Location: [place];  Service: Cardiovascular;  Laterality: N/A;  . IR ANGIOGRAM PULMONARY RIGHT SELECTIVE  08/04/2020  . IR CT SPINE LTD  08/04/2020  . IR EMBO ART  VEN HEMORR LYMPH EXTRAV  INC GUIDE ROADMAPPING  08/04/2020  . IR TRANSCATH/EMBOLIZ  08/04/2020  . RADIOLOGY WITH ANESTHESIA N/A 08/04/2020   Procedure: IR WITH ANESTHESIA;  Surgeon: Arne Cleveland, MD;  Location: [place];  Service: Radiology;  Laterality: N/A;  . RIGHT HEART CATH N/A 09/29/2019   Procedure: RIGHT HEART CATH;  Surgeon: Larey Dresser, MD;  Location: [place] CV LAB;  Service: Cardiovascular;  Laterality: N/A;  . RIGHT HEART CATH N/A 08/04/2020   Procedure: RIGHT HEART CATH;  Surgeon: Larey Dresser, MD;  Location: [place] CV LAB;  Service: Cardiovascular;  Laterality: N/A;  . SHOULDER SURGERY     closed reduction  . TEE WITHOUT CARDIOVERSION N/A 06/29/2017   Procedure: TRANSESOPHAGEAL ECHOCARDIOGRAM (TEE);  Surgeon: Larey Dresser, MD;  Location: [facility] ENDOSCOPY;  Service: Cardiovascular;  Laterality: N/A;  . TONSILLECTOMY    . TOTAL HIP ARTHROPLASTY Left 03/18/2013   Procedure: TOTAL HIP ARTHROPLASTY ANTERIOR APPROACH;  Surgeon: Mauri Pole, MD;  Location: [facility] OR[facility];  Service: Orthopedics;  Laterality: Left;  . TUBAL LIGATION        Interim history/subjective:   Extubated yesterday. No chest pain or dyspnea at rest. No difficulty swallowing. No further hemoptysis.  Objective   Blood pressure Marland Kitchen)  97/50, pulse 79, temperature (!) 97.5 F (36.4 C), resp. rate (!) 21, height 5\' 5"  (1.651 m), weight 56.6 kg, SpO2 96 %. CVP:  [4 mmHg-17 mmHg] 17 mmHg      Intake/Output Summary (Last 24 hours) at 08/06/2020  0919 Last data filed at 08/06/2020 0800 Gross per 24 hour  Intake 1411.5 ml  Output 545 ml  Net 866.5 ml   Filed Weights   08/03/20 0400 08/05/20 0500 08/06/20 0500  Weight: 56.6 kg 56.8 kg 56.6 kg     Physical Exam Constitutional:      Appearance: She is cachectic.     Interventions: awake and following commands.  HENT:     Head: Normocephalic and atraumatic.     Comments: 7 mm endotracheal tube at 22 cm. Cardiovascular:     Rate and Rhythm: Regular rhythm - AV paced. JVP at 3cm ASA    Heart sounds: Normal heart sounds.  Pulmonary:     Effort: No accessory muscle use    Breath sounds: crackles at right base Abdominal:     Palpations: Abdomen is soft.  Musculoskeletal:     Right lower leg: No edema.     Left lower leg: No edema.  Skin:    General: Skin is warm and dry.  Neurological:     Mental Status: no focal deficits.   Ancillary tests (personally reviewed)  CBC: Recent Labs  Lab 08/04/20 1026 08/04/20 1026 08/04/20 1137 08/04/20 2156 08/05/20 0442 08/05/20 1229 08/06/20 0402  WBC 6.0  --   --  9.0 10.3 13.4* 11.9*  HGB 13.0   < > 12.6 14.2 14.3 14.7 13.2  HCT 38.2   < > 37.0 41.0 41.5 42.7 39.0  MCV 92.3  --   --  90.1 90.8 90.9 93.5  PLT 153  --   --  168 172 183 144*   < > = values in this interval not displayed.    Basic Metabolic Panel: Recent Labs  Lab 08/03/20 0248 08/03/20 0248 08/04/20 0257 08/04/20 0257 08/04/20 1025 08/04/20 1026 08/04/20 1137 08/05/20 0442 08/06/20 0402  NA 136   < > 137   < > 139 136 141 136 134*  K 3.8   < > 2.9*   < > 3.5 3.4* 3.1* 3.4* 3.2*  CL 100  --  99  --   --  100  --  103 100  CO2 22  --  24  --   --  24  --  19* 21*  GLUCOSE 121*  --  94  --   --  144*  --  117* 109*  BUN 42*  --  38*  --   --  35*  --  28* 20  CREATININE 1.74*  --  1.62*  --   --  1.71*  --  1.33* 1.17*  CALCIUM 8.6*  --  8.4*  --   --  7.9*  --  8.2* 7.9*   < > = values in this interval not displayed.   GFR: Estimated  Creatinine Clearance: 32.6 mL/min (A) (by C-G formula based on SCr of 1.17 mg/dL (H)). Recent Labs  Lab 08/02/20 0922 08/02/20 1122 08/04/20 0916 08/04/20 2156 08/05/20 0442 08/05/20 1229 08/06/20 0402  WBC  --   --    < > 9.0 10.3 13.4* 11.9*  LATICACIDVEN 1.8 2.1*  --   --   --   --   --    < > = values in this interval not  displayed.    Liver Function Tests: No results for input(s): AST, ALT, ALKPHOS, BILITOT, PROT, ALBUMIN in the last 168 hours. No results for input(s): LIPASE, AMYLASE in the last 168 hours. No results for input(s): AMMONIA in the last 168 hours.  ABG    Component Value Date/Time   PHART 7.368 08/04/2020 1137   PCO2ART 42.9 08/04/2020 1137   PO2ART 160 (H) 08/04/2020 1137   HCO3 24.6 08/04/2020 1137   TCO2 26 08/04/2020 1137   ACIDBASEDEF 1.0 08/04/2020 1137   O2SAT 63.9 08/06/2020 0401     Coagulation Profile: No results for input(s): INR, PROTIME in the last 168 hours.  Cardiac Enzymes: No results for input(s): CKTOTAL, CKMB, CKMBINDEX, TROPONINI in the last 168 hours.  HbA1C: Hgb A1c MFr Bld  Date/Time Value Ref Range Status  12/17/2017 03:45 AM 5.6 4.8 - 5.6 % Final    Comment:    (NOTE) Pre diabetes:          5.7%-6.4% Diabetes:              >6.4% Glycemic control for   <7.0% adults with diabetes     CBG: No results for input(s): GLUCAP in the last 168 hours.   Assessment & Plan:  Critically ill due to acute hypoxic respiratory failure requiring mechanical ventilation secondary to massive atelectasis from likely pulmonary artery branch rupture. Nonischemic cardiomyopathy Cardiac cachexia Status post biventricular pacemaker Acute decompensated systolic heart failure Acute on chronic kidney disease stage III.  Plan: -Incentive spirometry -Ambulation -Stop amiodarone -Wean NE to off. - Appears euvolemic and renal function recovering - no diuresis today   Daily Goals Checklist  Pain/Anxiety/Delirium protocol (if indicated):  All sedation stopped VAP protocol (if indicated): Bundle in place Respiratory support goals: wean O2 to keep sat >92% blood pressure target:MAP>65 wean off NE DVT prophylaxis: Restarting IV heparin per AHF.  Nutritional status and feeding goals: Progressive diet GI prophylaxis: Stop protonix Fluid status goals: Per heart failure Urinary catheter: Assessment of intravascular volume Central lines: Right IJ TLC Glucose control: Phase 1 glycemic protocol Mobility/therapy needs: ad lib Antibiotic de-escalation: No antibiotics Home medication reconciliation: restart betablocker Daily labs: CBC, BMP daily. Code Status: Full code, palliative care following Family Communication: Family updated at bedside, palliative care is following Disposition: transfer to PCU if can stay off NE.  Critical care time: 40 min.    Kipp Brood, MD [facility] ICU Physician [place]  Pager: [phone] Or Epic Secure Chat After hours: [phone].  08/06/2020, 9:19 AM

## 2020-08-06 NOTE — Evaluation (Signed)
Occupational Therapy Evaluation Patient Details Name: Cheryl Vargas MRN: [id number] DOB: 1934/10/01 Today's Date: 08/06/2020    History of Present Illness Pt is an 85 year old woman admtted on 08/04/20 with acute on chronic hear failure. Hospital course complicated by sudden massive hemoptysis during cardiac catherization on 11/24 requiring emergent intubation and bronchoscopy. Extubated 11/25. PMH: NICM with EF 20%, HTN, HLD, PAF.   Clinical Impression   Pt was independent with AD and living alone prior to admission. Her family endorses pt having decreased activity tolerance and only walking short distances at baseline. Pt with minimal verbalization this visit with eyes closed much of session. Performed sit to stand and ambulated with moderate assistance. She requires min to total assist for ADL. Pt on 4L 02 throughout session with Sp02 difficult to accurately assess, but at one time dropping to 82% with activity Pt at 99% at rest. Recommending SNF for further rehab upon discharge. Will follow acutely.    Follow Up Recommendations  SNF;Supervision/Assistance - 24 hour    Equipment Recommendations  3 in 1 bedside commode    Recommendations for Other Services       Precautions / Restrictions Precautions Precautions: Fall Precaution Comments: watch 02 Restrictions Weight Bearing Restrictions: No      Mobility Bed Mobility               General bed mobility comments: Pt on 3N1 on arrival.      Transfers Overall transfer level: Needs assistance Equipment used: Rolling walker (2 wheeled) Transfers: Sit to/from Stand Sit to Stand: Mod assist;+2 safety/equipment         General transfer comment: Assist with mod assist to power up and maintain balance due to posterior lean.      Balance Overall balance assessment: Needs assistance   Sitting balance-Leahy Scale: Fair       Standing balance-Leahy Scale: Poor                             ADL either  performed or assessed with clinical judgement   ADL Overall ADL's : Needs assistance/impaired Eating/Feeding: Minimal assistance;Sitting   Grooming: Brushing hair;Sitting;Total assistance   Upper Body Bathing: Maximal assistance;Sitting   Lower Body Bathing: Total assistance;Sit to/from stand   Upper Body Dressing : Maximal assistance;Standing   Lower Body Dressing: Total assistance;Sitting/lateral leans   Toilet Transfer: Minimal assistance;+2 for safety/equipment;BSC;RW   Toileting- Clothing Manipulation and Hygiene: Total assistance;Sit to/from stand       Functional mobility during ADLs: +2 for physical assistance;Rolling walker;Moderate assistance       Vision Baseline Vision/History: Wears glasses       Perception     Praxis      Pertinent Vitals/Pain Pain Assessment: No/denies pain     Hand Dominance Right   Extremity/Trunk Assessment Upper Extremity Assessment Upper Extremity Assessment: Generalized weakness   Lower Extremity Assessment Lower Extremity Assessment: Defer to PT evaluation   Cervical / Trunk Assessment Cervical / Trunk Assessment: Kyphotic   Communication Communication Communication: No difficulties   Cognition Arousal/Alertness: Awake/alert Behavior During Therapy: Flat affect Overall Cognitive Status: Difficult to assess                                 General Comments: pt with minimal verbalization, somewhat slow response speed   General Comments  78-90 bpm, 91/67 at end of treatment.  Exercises    Shoulder Instructions      Home Living Family/patient expects to be discharged to:: Private residence Living Arrangements: Alone Available Help at Discharge: Family;Available PRN/intermittently Type of Home: Apartment Home Access: Level entry     Home Layout: One level     Bathroom Shower/Tub: Occupational psychologist: Handicapped height     Home Equipment: Shower seat;Hand held Corporate investment banker - 4 wheels;Toilet riser;Grab bars - toilet;Walker - 2 wheels;Cane - single point          Prior Functioning/Environment Level of Independence: Independent with assistive device(s)        Comments: uses rollator or cane when needed, stands to shower        OT Problem List: Decreased strength;Decreased activity tolerance;Impaired balance (sitting and/or standing);Decreased knowledge of use of DME or AE;Cardiopulmonary status limiting activity      OT Treatment/Interventions: Self-care/ADL training;Energy conservation;DME and/or AE instruction;Therapeutic activities;Patient/family education;Balance training    OT Goals(Current goals can be found in the care plan section) Acute Rehab OT Goals Patient Stated Goal: son wants rehab prior to returning home OT Goal Formulation: With patient/family Time For Goal Achievement: 08/20/20 Potential to Achieve Goals: Fair ADL Goals Pt Will Perform Grooming: with set-up;sitting Pt Will Perform Upper Body Dressing: with set-up;sitting Pt Will Perform Lower Body Dressing: with mod assist;sit to/from stand Pt Will Transfer to Toilet: with min guard assist;ambulating;bedside commode Pt Will Perform Toileting - Clothing Manipulation and hygiene: with min assist;sit to/from stand Pt/caregiver will Perform Home Exercise Program: Increased strength;Both right and left upper extremity;With Supervision  OT Frequency: Min 2X/week   Barriers to D/C:            Co-evaluation PT/OT/SLP Co-Evaluation/Treatment: Yes Reason for Co-Treatment: For patient/therapist safety PT goals addressed during session: Mobility/safety with mobility OT goals addressed during session: Proper use of Adaptive equipment and DME      AM-PAC OT "6 Clicks" Daily Activity     Outcome Measure Help from another person eating meals?: A Little Help from another person taking care of personal grooming?: A Lot Help from another person toileting, which includes using  toliet, bedpan, or urinal?: Total Help from another person bathing (including washing, rinsing, drying)?: A Lot Help from another person to put on and taking off regular upper body clothing?: A Lot Help from another person to put on and taking off regular lower body clothing?: Total 6 Click Score: 11   End of Session Equipment Utilized During Treatment: Oxygen;Gait belt;Rolling walker (4L) Nurse Communication: Mobility status  Activity Tolerance: Patient limited by fatigue Patient left: in chair;with call bell/phone within reach;with family/visitor present  OT Visit Diagnosis: Unsteadiness on feet (R26.81);Other abnormalities of gait and mobility (R26.89);Muscle weakness (generalized) (M62.81);Other (comment) (decreased activity tolerance)                Time: 0932-6712 OT Time Calculation (min): 25 min Charges:  OT General Charges $OT Visit: 1 Visit OT Evaluation $OT Eval Moderate Complexity: 1 Mod  Nestor Lewandowsky, OTR/L Acute Rehabilitation Services Pager: [phone] Office: [phone]  Malka So 08/06/2020, 11:50 AM

## 2020-08-06 NOTE — Progress Notes (Addendum)
[place] for heparin Indication: atrial fibrillation  Allergies  Allergen Reactions  . Sotalol Other (See Comments)    Prolonged QTc  . Alendronate Sodium Other (See Comments)    Arm pain  . Compazine [Prochlorperazine]   . Ciprofloxacin Other (See Comments)    Not effective  . Coreg [Carvedilol] Other (See Comments)    Fatigue   . Metoprolol Other (See Comments)    Profound fatigue    Patient Measurements: Height: 5\' 5"  (165.1 cm) Weight: 56.6 kg (124 lb 12.5 oz) IBW/kg (Calculated) : 57 Heparin Dosing Weight: 57 kg  Vital Signs: Temp: 98.4 F (36.9 C) (11/26 1640) BP: 104/59 (11/26 1730) Pulse Rate: 83 (11/26 1730)  Labs: Recent Labs    08/04/20 1026 08/04/20 1137 08/05/20 0442 08/05/20 0442 08/05/20 1229 08/06/20 0402 08/06/20 1607  HGB 13.0   < > 14.3   < > 14.7 13.2  --   HCT 38.2   < > 41.5  --  42.7 39.0  --   PLT 153   < > 172  --  183 144*  --   APTT  --   --   --   --   --   --  76*  HEPARINUNFRC  --   --   --   --   --   --  0.52  CREATININE 1.71*  --  1.33*  --   --  1.17*  --    < > = values in this interval not displayed.    Estimated Creatinine Clearance: 32.6 mL/min (A) (by C-G formula based on SCr of 1.17 mg/dL (H)).   Assessment: 16 yof on Xarelto PTA for hx Afib (LD 11/22), now s/p localized pulmonary artery perforation on 11/24 with successful coil embolization with IR.  Discussed with Dr Haroldine Laws and will start low-dose, fixed-rate heparin without titration and monitor s/sx of bleeding.   Heparin level and aPTT are both higher than specified goal.  No bleeding reported.  Goal of Therapy:  Heparin level <0.3 units/ml unless adjusted by MD  Monitor platelets by anticoagulation protocol: Yes   Plan:  Reduce heparin infusion to 450 units/hr Check heparin level in 8 hrs  Sumie Remsen D. Mina Marble, PharmD, BCPS, [place] 08/06/2020, 6:38 PM   ===========================  Addendum: Targeting slightly below  goal heparin level Increase heparin gtt to 500 units/hr  Louise Rawson D. Mina Marble, PharmD, BCPS, [place] 08/06/2020, 7:08 PM

## 2020-08-06 NOTE — Consult Note (Deleted)
Consultation Note Date: 08/06/2020   Patient Name: Cheryl Vargas  DOB: November 27, 1934  MRN: [id number]  Age / Sex: 85 y.o., female  PCP: Lavone Orn, MD Referring Physician: Larey Dresser, MD  Reason for Consultation: Establishing goals of care.  Concerns regarding MOST form.  HPI/Patient Profile: 85 y.o. female  with past medical history of NICM, mitral regurgitation, tricuspid regurgitation, debility who was admitted on 08/02/2020 with acute on chronic systolic heart failure.  She underwent RHC and unfortunately suffer hemoptysis during the procedure.  She required intubation and was successfully extubated the next day.    Mrs Coia had previously completed a [place] DNR form as well as a MOST form.  On her most form "limited interventions"  avoid ICU was selected but a note added to the form indicated that she would be willing to go to ICU.   RN requested clarification.  Mrs. Lemoine is being treated with milrinone and amiodarone.  She is stabilizing in cardiac ICU.    Clinical Assessment and Goals of Care:  I have reviewed medical records including EPIC notes, labs and imaging, received report from the care team, examined the patient and met at bedside with her son [name] and her daughter [name] to discuss diagnosis prognosis, Cataract, EOL wishes, disposition and options.  The patient has another son [name] who is also heavily involved in her care.  I introduced Palliative Medicine as specialized medical care for people living with serious illness. It focuses on providing relief from the symptoms and stress of a serious illness.   The patient asked about the differences between Palliative care and Hospice.   I explained that Palliative care is an umbrella term of which Hospice is 1 slice.  Palliative care is for anyone with very serious illness.  It can range from a single conversation to 2x monthly visits in the home  to full hospice care.    The patient's son [name] works for [employer] is is well informed with regard to home health services.  As we talked about Hospice, [name] brought up that Hospice does not provide 24 hour care in the home.  They are only present for brief periods of time.  Privately hired care would be necessary as the patient currently lives at home alone.  [name] lives out of town.  [name] and Tim live in town but both work daily.  Mrs. Brun seems very clear that she wants to be a DNR.  If her heart stops and she stops breathing she does not want to be resuscitated.  She also is clear that she prefers to be at home over rehab.  Her family viewed rehab as a method to gain time to make arrangements for her in the home.  Patient had experience with her husband utilizing Hospice services.  She is familiar with them.    We reviewed a MOST form together.  Patient selected DNR, Comfort measures only.    [name] requested that their brother [name] be a part of the conversation.  As  there is no time pressure today to determine whether or not the patient will be going to rehab or home with hospice - PMT can return tomorrow or Sunday and pick up the conversation with Mrs. Wnek and all appropriate family care givers.   Questions and concerns were addressed.  The family was encouraged to call with questions or concerns.    Primary Decision Maker:  PATIENT    SUMMARY OF RECOMMENDATIONS    Patient seems clear in her decisions.  Family is still wrestling with decision making as well as making care arrangements to support her. Patient is DNR. PMT will return tomorrow or Sunday to review the MOST form and assist with decisions regarding Hospice vs Palliative Care at discharge. Patient requested ativan PO be increased to TID PRN as she takes it at home.  Code Status/Advance Care Planning:  DNR   Symptom Management:   Ativan PO TID PRN  Additional Recommendations (Limitations, Scope, Preferences):  Full  Scope Treatment   Psycho-social/Spiritual:   Desire for further Chaplaincy support: not discussed.  Prognosis: likely 6 months or less secondary to advanced HF with severe valvular disease.    Discharge Planning: To Be Determined      Primary Diagnoses: Present on Admission: . Acute on chronic systolic (congestive) heart failure ([place])   I have reviewed the medical record, interviewed the patient and family, and examined the patient. The following aspects are pertinent.  Past Medical History:  Diagnosis Date  . Arthritis   . Chronic combined systolic (congestive) and diastolic (congestive) heart failure ([place])   . Mitral regurgitation   . Non-ischemic cardiomyopathy ([place])   . Persistent atrial fibrillation ([place])   . Ventricular tachycardia ([facility])    Social History   Socioeconomic History  . Marital status: Widowed    Spouse name: Not on file  . Number of children: Not on file  . Years of education: Not on file  . Highest education level: Not on file  Occupational History  . Not on file  Tobacco Use  . Smoking status: Never Smoker  . Smokeless tobacco: Never Used  Vaping Use  . Vaping Use: Never used  Substance and Sexual Activity  . Alcohol use: Yes    Alcohol/week: 3.0 standard drinks    Types: 3 Glasses of wine per week    Comment: per week  . Drug use: No  . Sexual activity: Not on file  Other Topics Concern  . Not on file  Social History Narrative  . Not on file   Social Determinants of Health   Financial Resource Strain:   . Difficulty of Paying Living Expenses: Not on file  Food Insecurity:   . Worried About Charity fundraiser in the Last Year: Not on file  . Ran Out of Food in the Last Year: Not on file  Transportation Needs:   . Lack of Transportation (Medical): Not on file  . Lack of Transportation (Non-Medical): Not on file  Physical Activity:   . Days of Exercise per Week: Not on file  . Minutes of Exercise per Session: Not on file    Stress:   . Feeling of Stress : Not on file  Social Connections:   . Frequency of Communication with Friends and Family: Not on file  . Frequency of Social Gatherings with Friends and Family: Not on file  . Attends Religious Services: Not on file  . Active Member of Clubs or Organizations: Not on file  . Attends Club or  Organization Meetings: Not on file  . Marital Status: Not on file   Family History  Problem Relation Age of Onset  . Lung cancer Mother   . Heart attack Father     Allergies  Allergen Reactions  . Sotalol Other (See Comments)    Prolonged QTc  . Alendronate Sodium Other (See Comments)    Arm pain  . Compazine [Prochlorperazine]   . Ciprofloxacin Other (See Comments)    Not effective  . Coreg [Carvedilol] Other (See Comments)    Fatigue   . Metoprolol Other (See Comments)    Profound fatigue     Vital Signs: BP 109/66   Pulse 79   Temp (!) 97.5 F (36.4 C)   Resp (!) 22   Ht _0  (1.651 m)   Wt 56.6 kg   SpO2 94%   BMI 20.76 kg/m  Pain Scale: 0-10 POSS *See Group Information*: 1-Acceptable,Awake and alert Pain Score: 0-No pain   SpO2: SpO2: 94 % O2 Device:SpO2: 94 % O2 Flow Rate: .O2 Flow Rate (L/min): 5 L/min    Palliative Assessment/Data: 40%     Time In: 2:30 Time Out: 3:30 Time Total: 60 min. Visit consisted of counseling and education dealing with the complex and emotionally intense issues surrounding the need for palliative care and symptom management in the setting of serious and potentially life-threatening illness. Greater than 50%  of this time was spent counseling and coordinating care related to the above assessment and plan.  Signed by: [name], PA-C Palliative Medicine  Please contact Palliative Medicine Team phone at [phone] for questions and concerns.  For individual provider: See Shea Evans

## 2020-08-06 NOTE — Progress Notes (Signed)
ANTICOAGULATION CONSULT NOTE - Initial Consult  Pharmacy Consult for heparin Indication: atrial fibrillation  Allergies  Allergen Reactions  . Sotalol Other (See Comments)    Prolonged QTc  . Alendronate Sodium Other (See Comments)    Arm pain  . Compazine [Prochlorperazine]   . Ciprofloxacin Other (See Comments)    Not effective  . Coreg [Carvedilol] Other (See Comments)    Fatigue   . Metoprolol Other (See Comments)    Profound fatigue    Patient Measurements: Height: 5\' 5"  (165.1 cm) Weight: 56.6 kg (124 lb 12.5 oz) IBW/kg (Calculated) : 57 Heparin Dosing Weight: 57 kg  Vital Signs: Temp: 97.5 F (36.4 C) (11/26 0801) Temp Source: Oral (11/26 0006) BP: 97/50 (11/26 0800) Pulse Rate: 79 (11/26 0800)  Labs: Recent Labs    08/04/20 1026 08/04/20 1137 08/05/20 0442 08/05/20 0442 08/05/20 1229 08/06/20 0402  HGB 13.0   < > 14.3   < > 14.7 13.2  HCT 38.2   < > 41.5  --  42.7 39.0  PLT 153   < > 172  --  183 144*  CREATININE 1.71*  --  1.33*  --   --  1.17*   < > = values in this interval not displayed.    Estimated Creatinine Clearance: 32.6 mL/min (A) (by C-G formula based on SCr of 1.17 mg/dL (H)).   Medical History: Past Medical History:  Diagnosis Date  . Arthritis   . Chronic combined systolic (congestive) and diastolic (congestive) heart failure ([place])   . Mitral regurgitation   . Non-ischemic cardiomyopathy ([place])   . Persistent atrial fibrillation ([place])   . Ventricular tachycardia (H[facility])     Medications:  Scheduled:  . Chlorhexidine Gluconate Cloth  6 each Topical Daily  . docusate sodium  100 mg Oral BID  . midodrine  5 mg Oral TID WC  . pantoprazole  40 mg Oral Daily  . polyethylene glycol  17 g Oral Daily    Assessment: 70 yof on Xarelto PTA for hx Afib (LD 11/22), now s/p localized pulmonary artery perforation on 11/24 with successful coil embolization with IR.   Hgb 13.2, plt 144. No s/sx of bleeding. Discussed with Dr Haroldine Laws and  will start low dose fixed rate heparin without titration and monitor s/sx of bleeding.   Goal of Therapy:  Heparin level <0.3 units/ml unless adjusted by MD  Monitor platelets by anticoagulation protocol: Yes   Plan:  Start heparin infusion at 700 units/hr Will get level in 8 hours to ensure not supratherapeutic - no titrations unless high Monitor daily HL, CBC, for s/sx of bleeding   Antonietta Jewel, PharmD, [place] Pharmacist  Phone: [phone] 08/06/2020 8:34 AM  Please check AMION for all [place] phone numbers After 10:00 PM, call [place] [phone]

## 2020-08-06 NOTE — Progress Notes (Signed)
Progress Note Date: 08/06/2020   Patient Name: Cheryl Vargas  DOB:   1935/02/19      MRN:   [id number]      Age / Sex:       85 y.o., female  PCP: Lavone Orn, MD Referring Physician: Larey Dresser, MD  Reason for Consultation: Establishing goals of care.  Concerns regarding MOST form.  HPI/Patient Profile: 85 y.o. female  with past medical history of NICM, mitral regurgitation, tricuspid regurgitation, debility who was admitted on 08/02/2020 with acute on chronic systolic heart failure.  She underwent RHC and unfortunately suffer hemoptysis during the procedure.  She required intubation and was successfully extubated the next day.    [name] had previously completed a [place] DNR form as well as a MOST form.  On her most form "limited interventions"  avoid ICU was selected but a note added to the form indicated that she would be willing to go to ICU.   RN requested clarification.  [name] is being treated with milrinone and amiodarone.  She is stabilizing in cardiac ICU.    Clinical Assessment and Goals of Care:  I have reviewed medical records including EPIC notes, labs and imaging, received report from the care team, examined the patient and met at bedside with her son [name] and her daughter [name] to discuss diagnosis prognosis, [place], EOL wishes, disposition and options.  The patient has another son [name] who is also heavily involved in her care.  I introduced Palliative Medicine as specialized medical care for people living with serious illness. It focuses on providing relief from the symptoms and stress of a serious illness.   The patient asked about the differences between Palliative care and Hospice.   I explained that Palliative care is an umbrella term of which Hospice is 1 slice.  Palliative care is for anyone with very serious illness.  It can range from a single conversation to 2x monthly visits in the home to full hospice care.    The patient's son [name] works for  [employer] is is well informed with regard to home health services.  As we talked about Hospice, [name] brought up that Hospice does not provide 24 hour care in the home.  They are only present for brief periods of time.  Privately hired care would be necessary as the patient currently lives at home alone.  [name] lives out of town.  [name] and Tim live in town but both work daily.  [name] seems very clear that she wants to be a DNR.  If her heart stops and she stops breathing she does not want to be resuscitated.  She also is clear that she prefers to be at home over rehab.  Her family viewed rehab as a method to gain time to make arrangements for her in the home.  Patient had experience with her husband utilizing Hospice services.  She is familiar with them.    We reviewed a MOST form together.  Patient selected DNR, Comfort measures only.    [name] requested that their brother [name] be a part of the conversation.  As there is no time pressure today to determine whether or not the patient will be going to rehab or home with hospice - PMT can return tomorrow or Sunday and pick up the conversation with [name] and all appropriate family care givers.   Questions and concerns were addressed.  The family was encouraged to call with questions or concerns.  Primary Decision Maker:  PATIENT    SUMMARY OF RECOMMENDATIONS    Patient seems clear in her decisions.  Family is still wrestling with decision making as well as making care arrangements to support her. Patient is DNR. PMT will return tomorrow or Sunday to review the MOST form and assist with decisions regarding Hospice vs Palliative Care at discharge. Patient requested ativan PO be increased to TID PRN as she takes it at home.  Code Status/Advance Care Planning:  DNR   Symptom Management:   Ativan PO TID PRN  Additional Recommendations (Limitations, Scope, Preferences):  Full Scope  Treatment   Psycho-social/Spiritual:   Desire for further Chaplaincy support: not discussed.  Prognosis: likely 6 months or less secondary to advanced HF with severe valvular disease.    Discharge Planning: To Be Determined      Primary Diagnoses: Present on Admission: . Acute on chronic systolic (congestive) heart failure ([place])   I have reviewed the medical record, interviewed the patient and family, and examined the patient. The following aspects are pertinent.      Past Medical History:  Diagnosis Date  . Arthritis   . Chronic combined systolic (congestive) and diastolic (congestive) heart failure ([place])   . Mitral regurgitation   . Non-ischemic cardiomyopathy ([place])   . Persistent atrial fibrillation ([place])   . Ventricular tachycardia ([facility])    Social History        Socioeconomic History  . Marital status: Widowed    Spouse name: Not on file  . Number of children: Not on file  . Years of education: Not on file  . Highest education level: Not on file  Occupational History  . Not on file  Tobacco Use  . Smoking status: Never Smoker  . Smokeless tobacco: Never Used  Vaping Use  . Vaping Use: Never used  Substance and Sexual Activity  . Alcohol use: Yes    Alcohol/week: 3.0 standard drinks    Types: 3 Glasses of wine per week    Comment: per week  . Drug use: No  . Sexual activity: Not on file  Other Topics Concern  . Not on file  Social History Narrative  . Not on file   Social Determinants of Health      Financial Resource Strain:   . Difficulty of Paying Living Expenses: Not on file  Food Insecurity:   . Worried About Charity fundraiser in the Last Year: Not on file  . Ran Out of Food in the Last Year: Not on file  Transportation Needs:   . Lack of Transportation (Medical): Not on file  . Lack of Transportation (Non-Medical): Not on file  Physical Activity:   . Days of Exercise per Week: Not on file  . Minutes of  Exercise per Session: Not on file  Stress:   . Feeling of Stress : Not on file  Social Connections:   . Frequency of Communication with Friends and Family: Not on file  . Frequency of Social Gatherings with Friends and Family: Not on file  . Attends Religious Services: Not on file  . Active Member of Clubs or Organizations: Not on file  . Attends Archivist Meetings: Not on file  . Marital Status: Not on file        Family History  Problem Relation Age of Onset  . Lung cancer Mother   . Heart attack Father          Allergies  Allergen Reactions  .  Sotalol Other (See Comments)    Prolonged QTc  . Alendronate Sodium Other (See Comments)    Arm pain  . Compazine [Prochlorperazine]   . Ciprofloxacin Other (See Comments)    Not effective  . Coreg [Carvedilol] Other (See Comments)    Fatigue   . Metoprolol Other (See Comments)    Profound fatigue     Vital Signs: BP 109/66   Pulse 79   Temp (!) 97.5 F (36.4 C)   Resp (!) 22   Ht '5\' 5"'  (1.651 m)   Wt 56.6 kg   SpO2 94%   BMI 20.76 kg/m  Pain Scale: 0-10 POSS *See Group Information*: 1-Acceptable,Awake and alert Pain Score: 0-No pain   SpO2: SpO2: 94 % O2 Device:SpO2: 94 % O2 Flow Rate: .O2 Flow Rate (L/min): 5 L/min    Palliative Assessment/Data: 40%    Time In: 2:30 Time Out: 3:30 Time Total: 60 min. Visit consisted of counseling and education dealing with the complex and emotionally intense issues surrounding the need for palliative care and symptom management in the setting of serious and potentially life-threatening illness. Greater than 50% of this time was spent counseling and coordinating care related to the above assessment and plan.  Signed by: [name], PA-C Palliative Medicine  Please contact Palliative Medicine Team phone at [phone] for questions and concerns.  For individual provider: See Shea Evans

## 2020-08-07 DIAGNOSIS — I5023 Acute on chronic systolic (congestive) heart failure: Secondary | ICD-10-CM | POA: Diagnosis not present

## 2020-08-07 DIAGNOSIS — I5043 Acute on chronic combined systolic (congestive) and diastolic (congestive) heart failure: Secondary | ICD-10-CM | POA: Diagnosis not present

## 2020-08-07 DIAGNOSIS — Z515 Encounter for palliative care: Secondary | ICD-10-CM | POA: Diagnosis not present

## 2020-08-07 LAB — BASIC METABOLIC PANEL
Anion gap: 11 (ref 5–15)
BUN: 20 mg/dL (ref 8–23)
CO2: 20 mmol/L — ABNORMAL LOW (ref 22–32)
Calcium: 7.9 mg/dL — ABNORMAL LOW (ref 8.9–10.3)
Chloride: 103 mmol/L (ref 98–111)
Creatinine, Ser: 1.18 mg/dL — ABNORMAL HIGH (ref 0.44–1.00)
GFR, Estimated: 46 mL/min — ABNORMAL LOW (ref >60)
Glucose, Bld: 104 mg/dL — ABNORMAL HIGH (ref 70–99)
Potassium: 4.4 mmol/L (ref 3.5–5.1)
Sodium: 134 mmol/L — ABNORMAL LOW (ref 135–145)

## 2020-08-07 LAB — CBC
HCT: 38.4 % (ref 36.0–46.0)
Hemoglobin: 13 g/dL (ref 12.0–15.0)
MCH: 31.1 pg (ref 26.0–34.0)
MCHC: 33.9 g/dL (ref 30.0–36.0)
MCV: 91.9 fL (ref 80.0–100.0)
Platelets: 141 10*3/uL — ABNORMAL LOW (ref 150–400)
RBC: 4.18 MIL/uL (ref 3.87–5.11)
RDW: 13.2 % (ref 11.5–15.5)
WBC: 10 10*3/uL (ref 4.0–10.5)
nRBC: 0 % (ref 0.0–0.2)

## 2020-08-07 LAB — COOXEMETRY PANEL
Carboxyhemoglobin: 1.4 % (ref 0.5–1.5)
Methemoglobin: 0.8 % (ref 0.0–1.5)
O2 Saturation: 59 %
Total hemoglobin: 11.8 g/dL — ABNORMAL LOW (ref 12.0–16.0)

## 2020-08-07 LAB — HEPARIN LEVEL (UNFRACTIONATED)
Heparin Unfractionated: 0.4 IU/mL (ref 0.30–0.70)
Heparin Unfractionated: 0.19 IU/mL — ABNORMAL LOW (ref 0.30–0.70)

## 2020-08-07 LAB — CULTURE, BLOOD (ROUTINE X 2)
Culture: NO GROWTH
Culture: NO GROWTH
Special Requests: ADEQUATE
Special Requests: ADEQUATE

## 2020-08-07 MED ORDER — FUROSEMIDE 40 MG PO TABS
40.0000 mg | ORAL_TABLET | Freq: Every day | ORAL | Status: DC
  Administered 2020-08-07 – 2020-08-08 (×2): 40 mg via ORAL
  Filled 2020-08-07 (×2): qty 1

## 2020-08-07 MED ORDER — LORAZEPAM 0.5 MG PO TABS
0.5000 mg | ORAL_TABLET | Freq: Three times a day (TID) | ORAL | Status: DC | PRN
  Administered 2020-08-08 – 2020-08-09 (×3): 0.5 mg via ORAL
  Filled 2020-08-07 (×3): qty 1

## 2020-08-07 MED ORDER — AMIODARONE HCL 200 MG PO TABS
200.0000 mg | ORAL_TABLET | Freq: Two times a day (BID) | ORAL | Status: DC
  Administered 2020-08-07 – 2020-08-08 (×3): 200 mg via ORAL
  Filled 2020-08-07 (×3): qty 1

## 2020-08-07 NOTE — Plan of Care (Signed)
  Problem: Clinical Measurements: Goal: Respiratory complications will improve Outcome: Progressing   Problem: Activity: Goal: Risk for activity intolerance will decrease Outcome: Progressing   

## 2020-08-07 NOTE — Progress Notes (Signed)
Patient ID: Cheryl Vargas, female   DOB: 1935/06/21, 85 y.o.   MRN: [id number]       Advanced Heart Failure Rounding Note  PCP-Cardiologist: Cheryl Grayer, MD   Subjective:    Had localized pulmonary artery perforation during cath on 11/24. Intubated and underwent successful coil embolization with IR. Had VT x 1 during cath and shocked.  Extubated 11/25. Slight blood-tinged sputum.  Now off NE, on midodrine 10 tid. Co-ox 59%. Hgb 13. Creatinine stable 1.18. SBP 90s.  CVP 7.   Family has been discussing hospice, palliative care service following.   - ECHO EF 20-25% RV normal, biatrial enlargement, IVC normal.   - RHC 11/24  Procedural Findings (incomplete study due to emergency): Hemodynamics (mmHg) RA mean 6 PA 41/16, mean 26 PCWP mean 19 Oxygen saturations: PA 57% AO 93% Cardiac Output (Fick) 2.92  Cardiac Index (Fick) 1.8    Objective:   Weight Range: 58.3 kg Body mass index is 21.39 kg/m.   Vital Signs:   Temp:  [97.5 F (36.4 C)-98.4 F (36.9 C)] 97.8 F (36.6 C) (11/27 0400) Pulse Rate:  [79-89] 81 (11/27 0800) Resp:  [7-29] 14 (11/27 0800) BP: (81-110)/(42-70) 98/55 (11/27 0800) SpO2:  [85 %-99 %] 96 % (11/27 0800) Weight:  [58.3 kg] 58.3 kg (11/27 0500) Last BM Date: 08/02/20  Weight change: Filed Weights   08/05/20 0500 08/06/20 0500 08/07/20 0500  Weight: 56.8 kg 56.6 kg 58.3 kg    Intake/Output:   Intake/Output Summary (Last 24 hours) at 08/07/2020 0829 Last data filed at 08/07/2020 0700 Gross per 24 hour  Intake 940.98 ml  Output 300 ml  Net 640.98 ml      Physical Exam    General: NAD, frail Neck: No JVD, no thyromegaly or thyroid nodule.  Lungs: Clear to auscultation bilaterally with normal respiratory effort. CV: Nondisplaced PMI.  Heart regular S1/S2, no S3/S4, 2/6 HSM LLSB.  No peripheral edema.   Abdomen: Soft, nontender, no hepatosplenomegaly, no distention.  Skin: Intact without lesions or rashes.  Neurologic: Alert and  oriented x 3.  Psych: Normal affect. Extremities: No clubbing or cyanosis.  HEENT: Normal.   Telemetry   BiV paced 80, underlying AF. Personally reviewed  Labs    CBC Recent Labs    08/06/20 0402 08/07/20 0354  WBC 11.9* 10.0  HGB 13.2 13.0  HCT 39.0 38.4  MCV 93.5 91.9  PLT 144* 937*   Basic Metabolic Panel Recent Labs    08/06/20 0402 08/06/20 0753 08/07/20 0354  NA 134*  --  134*  K 3.2*  --  4.4  CL 100  --  103  CO2 21*  --  20*  GLUCOSE 109*  --  104*  BUN 20  --  20  CREATININE 1.17*  --  1.18*  CALCIUM 7.9*  --  7.9*  MG  --  2.1  --    Liver Function Tests No results for input(s): AST, ALT, ALKPHOS, BILITOT, PROT, ALBUMIN in the last 72 hours. No results for input(s): LIPASE, AMYLASE in the last 72 hours. Cardiac Enzymes No results for input(s): CKTOTAL, CKMB, CKMBINDEX, TROPONINI in the last 72 hours.  BNP: BNP (last 3 results) Recent Labs    09/27/19 1708 08/02/20 0832  BNP 1,032.7* 1,285.0*    ProBNP (last 3 results) No results for input(s): PROBNP in the last 8760 hours.   D-Dimer No results for input(s): DDIMER in the last 72 hours. Hemoglobin A1C No results for input(s): HGBA1C in the  last 72 hours. Fasting Lipid Panel Recent Labs    08/05/20 0442  TRIG 101   Thyroid Function Tests No results for input(s): TSH, T4TOTAL, T3FREE, THYROIDAB in the last 72 hours.  Invalid input(s): FREET3  Other results:   Imaging    No results found.   Medications:     Scheduled Medications: . amiodarone  200 mg Oral BID  . Chlorhexidine Gluconate Cloth  6 each Topical Daily  . docusate sodium  100 mg Oral BID  . furosemide  40 mg Oral Daily  . midodrine  10 mg Oral TID WC  . polyethylene glycol  17 g Oral Daily    Infusions: . heparin 400 Units/hr (08/07/20 0700)  . norepinephrine (LEVOPHED) Adult infusion Stopped (08/06/20 1142)    PRN Medications: acetaminophen, fentaNYL, LORazepam, phenol, sodium chloride flush,  zolpidem    Patient Profile   Cheryl Vargas is an 85 year old with a history ofHTN, HLD, PAF (On Xarelto), and nonischemic cardiomyopathy with chronic systolic CHF. Cardiomyopathy dates back to at least 2014. Most recent ECHO 09/2019 showed EF < 20%.  Intolerant ranolazine, losartan,spironolactone, amiodarone, sotolol, and tikosyn.  Admitted with presyncope/volume overload.   Assessment/Plan   1. Hemoptysis: Due to small, isolated right PA branch rupture on 11/24. She was intubated and underwent bronchoscopy with clearing of blood. Suspect bleeding contained quickly. S/p coiling PA branch in IR 11/24.  Hgb stable 13.  Scant blood-tinged sputum.  - Currently on low dose heparin, will continue today and restart Xarelto likely tomorrow if stable.  2. Acute on chronic systolic HF: [place] 05/7672 showed no significant coronary disease. Cardiac MRI 10/17 EF 36%, diffuse HK, normal RV, biatrial enlargement, moderate MR, non-coronary LGE pattern involving the mid-wall of the basal to mid septum and inferior wall. Possibly prior myocarditis versus a form of infiltrative disease. Medtronic CRT-D s/p AV nodal ablation. Echo in 1/21 showed EF <20% with moderate LV dilation and mildly decreased RV systolic function. Low output on 1/21 RHC (CI 1.72). With age and frailty, LVAD not thought to be good option. At last appointment, she was not BiV pacing at an ideal percentage, likely due to PVCs. Symptomatically worse, NYHA class IIIb since decreasing Lasix in setting of rising creatinine. Low output HF noted this admission as well, CI on RHC was 1.8.  She was on milrinone 0.125 prior to [place].  EP has seen, CRT device adjusted and QRS now narrower.  Echo this admission with EF 20-25%, RV low normal systolic function, severe MR and TR.  She diuresed well this admission, PCWP 19 on RHC.  As above, was intubated with pulmonary hemorrhage post-RHC, now extubated and stable.  Off NE and milrinone currently. CVP 7  today with co-ox 59%.  - Continue midodrine 10 mg tid.  - Follow today off milrinone, recheck co-ox tomorrow am.  Will then need to decide on home with palliative milrinone.  If goes home on milrinone and chooses hospice, will need [facility] most likely. - Restart po Lasix 40 mg daily.  -Hold bisoprolol as retrying amiodarone as below.  -Off digoxin with persistently elevated level despite cutting back to 0.0625 mg qod. - Concerned that frequent PVCs may be affecting function (and also limiting BiV pacing percentage) => Retrying her on amiodarone. Tolerating so far.  - She does not qualify for [facility] given age 16. Atrial fibrillation: Chronic, s/p AV nodal ablation and BiV pacing.  - Hold Xarelto with pulmonary hemorrhage.  Now on heparin at low dose.  Will  restart Xarelto tomorrow if stable.  4. PVCs/VT: Frequent PVCs have limited BiV pacing. Not a good PVC ablation candidate per EP. Had VT in 1/21. Cannot tolerate amiodarone, sotolol, or Tikosyn. Did not tolerate mexilitine with nausea. Had nausea also with ranolazine. Additionally, did not tolerate further uptitration of bisoprolol.  VT during pulmonary hemorrhage episode in setting of low oxygen saturation.  - Continue amiodarone, back to po.  5. Mitral regurgitation: I reviewed her echo.  At least 3+ MR, suspect functional due to dilated annulus with severely dilated LA.  TR appears worse (clearly severe).  Discussed with Dr. Burt Knack, do not think that she would be a good Mitraclip candidate as TR is clearly worse than MR.  6. AKI on CKD stage 3: Creatinine 1.18 today, stable.  - Daily BMET 7. Acute hypoxemic respiratory failure: Intubated in setting of hemoptysis. CCM following.   - extubated 11/25 8. Hypokalemia - Stable 9. Debility - PT/OT consult => recommend SNF  Family to discuss plan, suspect will need SNF +/- home milrinone and then consider transition to hospice.  Can go to step down today.    [name] 08/07/2020 8:29  AM

## 2020-08-07 NOTE — Progress Notes (Signed)
[place] for heparin Indication: atrial fibrillation  Allergies  Allergen Reactions  . Sotalol Other (See Comments)    Prolonged QTc  . Alendronate Sodium Other (See Comments)    Arm pain  . Compazine [Prochlorperazine]   . Ciprofloxacin Other (See Comments)    Not effective  . Coreg [Carvedilol] Other (See Comments)    Fatigue   . Metoprolol Other (See Comments)    Profound fatigue    Patient Measurements: Height: 5\' 5"  (165.1 cm) Weight: 58.3 kg (128 lb 8.5 oz) IBW/kg (Calculated) : 57 Heparin Dosing Weight: 57 kg  Vital Signs: Temp: 97.8 F (36.6 C) (11/27 1209) Temp Source: Oral (11/27 1209) BP: 90/58 (11/27 1106) Pulse Rate: 80 (11/27 1106)  Labs: Recent Labs    08/05/20 0442 08/05/20 0442 08/05/20 1229 08/05/20 1229 08/06/20 0402 08/06/20 1607 08/07/20 0354  HGB 14.3   < > 14.7   < > 13.2  --  13.0  HCT 41.5   < > 42.7  --  39.0  --  38.4  PLT 172   < > 183  --  144*  --  141*  APTT  --   --   --   --   --  76*  --   HEPARINUNFRC  --   --   --   --   --  0.52 0.40  CREATININE 1.33*  --   --   --  1.17*  --  1.18*   < > = values in this interval not displayed.    Estimated Creatinine Clearance: 32.5 mL/min (A) (by C-G formula based on SCr of 1.18 mg/dL (H)).   Assessment: 14 yof on Xarelto PTA for hx Afib (LD 11/22), now s/p localized pulmonary artery perforation on 11/24 with successful coil embolization with IR.  Discussed with Dr Haroldine Laws and will start low-dose, fixed-rate heparin without titration and monitor s/sx of bleeding.   Heparin level (0.19) within goal on fixed-rate heparin 400 units/hr. CBC stable, Hgb wnl at 13, plts 141. No overt bleeding or infusion issues per discussion with nursing. Will continue current infusion rate.   Goal of Therapy:  Heparin level <0.3 units/ml unless adjusted by MD  Monitor platelets by anticoagulation protocol: Yes   Plan:  Continue heparin infusion at 400  units/hr Monitor daily HL, cbc, s/sx bleeding F/u switch to Xarelto if remains stable, possibly tomorrow  Richardine Service, PharmD, BCPS PGY2 Cardiology Pharmacy Resident Phone: [phone] 08/07/2020  2:57 PM  Please check [email or web address] for unit-specific pharmacy phone numbers.

## 2020-08-07 NOTE — Progress Notes (Signed)
Daily Progress Note   Patient Name: Cheryl Vargas       Date: 08/07/2020 DOB: [birth year]  Age: 85 y.o. MRN#: [id number] Attending Physician: Larey Dresser, MD Primary Care Physician: Lavone Orn, MD Admit Date: 08/02/2020  Reason for Consultation/Follow-up: To discuss complex medical decision making related to patient's goals of care  Subjective: Met patient and son, Cheryl Vargas at bedside.  Patient is up in the chair in no distress and mentation is more clear than yesterday.  Patient asks about Palliative vs Hospice.  Son and [place], Cheryl Vargas works for [employer] and is very familiar with support services such as home health care, Palliative and Hospice.  Cheryl Vargas is weighing options between rehab vs ALF and then home with privately hired care.    Whether or not she is discharged on Milrinone will have an inpact the decision of where she will discharge (SNF vs ALF vs home) as well as what type of care she will receive PT/OT, Palliative, Hospice.  Cheryl Vargas is weighing all of these options.  Patient defers to his judgement.  After speaking [name], I spoke with Cheryl Vargas out in the hallway.  He has very realistic expectations about his mother's prognosis - weeks to months.  I actually feel like she is more likely to have some number of months.  He tells me about his care giving experience (20+ years) and the care of his mother - which sounds exceptional.  He explains family dynamics.  It would be helpful if his mother were to move to the [place] area to be near him.  He is weighing out a number of different factors in arranging for her care.  We decided to revisit the pink MOST form a day or two prior to discharge.  At that point we will have a better handle on her condition, where she will discharge, and  what treatments will be appropriate.  Assessment: Very pleasant, coherent, frail, 85 yo female with advanced heart failure (EF 20%) and valvular disease.  She is not inclined to pursue aggressive interventions, nor is she a good candidate for aggressive interventions.  DNR.   Patient Profile/HPI:  85 y.o.femalewith past medical history of NICM, mitral regurgitation, tricuspid regurgitation, debilitywho was admitted on 11/22/2021with acute on chronic systolic heart failure. She underwent RHC and unfortunately suffer hemoptysis during  the procedure. She required intubation and was successfully extubated the next day.   Length of Stay: 5   Vital Signs: BP (!) 98/55   Pulse 78   Temp 97.6 F (36.4 C)   Resp 17   Ht _0  (1.651 m)   Wt 58.3 kg   SpO2 97%   BMI 21.39 kg/m  SpO2: SpO2: 97 % O2 Device: O2 Device: Nasal Cannula O2 Flow Rate: O2 Flow Rate (L/min): 2 L/min       Palliative Assessment/Data: 40%     Palliative Care Plan    Recommendations/Plan: DNR Continue current care. PMT will check in a day or two prior to DC to revise MOST form options with patient and provide support.  Code Status:  DNR  Prognosis:  Months - She is hospice eligible with an EF of 20% and significant valvular disease with a priority on quality of life over quantity of life.  Discharge Planning: To Be Determined  Care plan was discussed with Patient and [facility]yl [facility]argas [facility])  Thank you for allowing the Palliative Medicine Team to assist in the care of this patient.  Total time spent:  35 min.     Greater than 50%  of this time was spent counseling and coordinating care related to the above assessment and plan.  [name], PA-C Palliative Medicine  Please contact Palliative MedicineTeam phone at [phone] for questions and concerns between 7 am - 7 pm.   Please see AMION for individual provider pager numbers.

## 2020-08-07 NOTE — Progress Notes (Signed)
ANTICOAGULATION CONSULT NOTE  Pharmacy Consult for heparin Indication: atrial fibrillation   Labs: Recent Labs    08/04/20 1026 08/04/20 1137 08/05/20 0442 08/05/20 0442 08/05/20 1229 08/05/20 1229 08/06/20 0402 08/06/20 1607 08/07/20 0354  HGB 13.0   < > 14.3   < > 14.7   < > 13.2  --  13.0  HCT 38.2   < > 41.5   < > 42.7  --  39.0  --  38.4  PLT 153   < > 172   < > 183  --  144*  --  141*  APTT  --   --   --   --   --   --   --  76*  --   HEPARINUNFRC  --   --   --   --   --   --   --  0.52 0.40  CREATININE 1.71*  --  1.33*  --   --   --  1.17*  --   --    < > = values in this interval not displayed.   Assessment: 29 yof on Xarelto PTA for hx Afib ([place] 11/22), now s/p localized pulmonary artery perforation on 11/24 with successful coil embolization with IR.  Discussed with Dr Haroldine Laws and will start low-dose, fixed-rate heparin without titration and monitor s/sx of bleeding.   Heparin level 0.40 units/ml  Goal of Therapy:  Heparin level <0.3 units/ml unless adjusted by MD  Monitor platelets by anticoagulation protocol: Yes   Plan:  Reduce heparin infusion to 400 units/hr Check heparin level in 8 hrs  Thanks for allowing pharmacy to be a part of this patient's care.  Excell Seltzer, PharmD Clinical Pharmacist 08/07/2020, 5:39 AM

## 2020-08-08 DIAGNOSIS — Z515 Encounter for palliative care: Secondary | ICD-10-CM

## 2020-08-08 DIAGNOSIS — I5043 Acute on chronic combined systolic (congestive) and diastolic (congestive) heart failure: Secondary | ICD-10-CM | POA: Diagnosis not present

## 2020-08-08 DIAGNOSIS — I5023 Acute on chronic systolic (congestive) heart failure: Secondary | ICD-10-CM | POA: Diagnosis not present

## 2020-08-08 DIAGNOSIS — Z7189 Other specified counseling: Secondary | ICD-10-CM

## 2020-08-08 LAB — TYPE AND SCREEN
ABO/RH(D): O POS
Antibody Screen: NEGATIVE
Unit division: 0

## 2020-08-08 LAB — BASIC METABOLIC PANEL
Anion gap: 10 (ref 5–15)
BUN: 22 mg/dL (ref 8–23)
CO2: 21 mmol/L — ABNORMAL LOW (ref 22–32)
Calcium: 7.6 mg/dL — ABNORMAL LOW (ref 8.9–10.3)
Chloride: 101 mmol/L (ref 98–111)
Creatinine, Ser: 1.2 mg/dL — ABNORMAL HIGH (ref 0.44–1.00)
GFR, Estimated: 45 mL/min — ABNORMAL LOW (ref >60)
Glucose, Bld: 131 mg/dL — ABNORMAL HIGH (ref 70–99)
Potassium: 3.7 mmol/L (ref 3.5–5.1)
Sodium: 132 mmol/L — ABNORMAL LOW (ref 135–145)

## 2020-08-08 LAB — COOXEMETRY PANEL
Carboxyhemoglobin: 1.4 % (ref 0.5–1.5)
Methemoglobin: 0.9 % (ref 0.0–1.5)
O2 Saturation: 41.5 %
Total hemoglobin: 12.6 g/dL (ref 12.0–16.0)

## 2020-08-08 LAB — CBC
HCT: 34.3 % — ABNORMAL LOW (ref 36.0–46.0)
Hemoglobin: 11.7 g/dL — ABNORMAL LOW (ref 12.0–15.0)
MCH: 30.7 pg (ref 26.0–34.0)
MCHC: 34.1 g/dL (ref 30.0–36.0)
MCV: 90 fL (ref 80.0–100.0)
Platelets: 138 10*3/uL — ABNORMAL LOW (ref 150–400)
RBC: 3.81 MIL/uL — ABNORMAL LOW (ref 3.87–5.11)
RDW: 13.2 % (ref 11.5–15.5)
WBC: 8.8 10*3/uL (ref 4.0–10.5)
nRBC: 0 % (ref 0.0–0.2)

## 2020-08-08 LAB — HEPARIN LEVEL (UNFRACTIONATED): Heparin Unfractionated: 0.19 IU/mL — ABNORMAL LOW (ref 0.30–0.70)

## 2020-08-08 LAB — BPAM RBC
Blood Product Expiration Date: 202112252359
Unit Type and Rh: 5100

## 2020-08-08 MED ORDER — MELATONIN 3 MG PO TABS
6.0000 mg | ORAL_TABLET | Freq: Every day | ORAL | Status: DC
  Administered 2020-08-08 – 2020-08-18 (×4): 6 mg via ORAL
  Filled 2020-08-08 (×7): qty 2

## 2020-08-08 MED ORDER — POTASSIUM CHLORIDE CRYS ER 20 MEQ PO TBCR
40.0000 meq | EXTENDED_RELEASE_TABLET | Freq: Once | ORAL | Status: AC
  Administered 2020-08-08: 40 meq via ORAL
  Filled 2020-08-08: qty 2

## 2020-08-08 MED ORDER — MILRINONE LACTATE IN DEXTROSE 20-5 MG/100ML-% IV SOLN
0.2500 ug/kg/min | INTRAVENOUS | Status: DC
  Administered 2020-08-08 – 2020-08-18 (×11): 0.25 ug/kg/min via INTRAVENOUS
  Filled 2020-08-08 (×11): qty 100

## 2020-08-08 MED ORDER — AMIODARONE HCL 200 MG PO TABS
200.0000 mg | ORAL_TABLET | Freq: Every day | ORAL | Status: DC
  Administered 2020-08-09 – 2020-08-18 (×10): 200 mg via ORAL
  Filled 2020-08-08 (×10): qty 1

## 2020-08-08 MED ORDER — RIVAROXABAN 15 MG PO TABS
15.0000 mg | ORAL_TABLET | Freq: Every day | ORAL | Status: DC
  Administered 2020-08-08: 15 mg via ORAL
  Filled 2020-08-08: qty 1

## 2020-08-08 MED ORDER — MELATONIN 3 MG PO TABS: 6.0000 mg | ORAL_TABLET | Freq: Every day | ORAL | Status: DC

## 2020-08-08 MED ORDER — FUROSEMIDE 10 MG/ML IJ SOLN
40.0000 mg | Freq: Once | INTRAMUSCULAR | Status: AC
  Administered 2020-08-08: 40 mg via INTRAVENOUS
  Filled 2020-08-08: qty 4

## 2020-08-08 NOTE — Progress Notes (Addendum)
ANTICOAGULATION CONSULT NOTE  Pharmacy Consult for heparin Indication: atrial fibrillation  Patient Measurements: Height: 5\' 5"  (165.1 cm) Weight: 59.7 kg (131 lb 9.8 oz) IBW/kg (Calculated) : 57 Heparin Dosing Weight: 57 kg  Vital Signs: Temp: 98.7 F (37.1 C) (11/28 0700) Temp Source: Oral (11/28 0700) BP: 100/61 (11/28 0800) Pulse Rate: 79 (11/28 0800)  Labs: Recent Labs     0000 08/06/20 0402 08/06/20 1607 08/06/20 1607 08/07/20 0354 08/07/20 1415 08/08/20 0357  HGB   < > 13.2  --   --  13.0  --  11.7*  HCT  --  39.0  --   --  38.4  --  34.3*  PLT  --  144*  --   --  141*  --  138*  APTT  --   --  76*  --   --   --   --   HEPARINUNFRC  --   --  0.52   < > 0.40 0.19* 0.19*  CREATININE  --  1.17*  --   --  1.18*  --  1.20*   < > = values in this interval not displayed.    Estimated Creatinine Clearance: 32 mL/min (A) (by C-G formula based on SCr of 1.2 mg/dL (H)).   Assessment: 5 yof on Xarelto PTA for hx Afib (LD 11/22), now s/p localized pulmonary artery perforation on 11/24 with successful coil embolization with IR.  Discussed with Dr Haroldine Laws and will start low-dose, fixed-rate heparin without titration and monitor s/sx of bleeding.   Heparin level (0.19) remains within goal on fixed-rate heparin 400 units/hr. Hgb dropped slightly 13>11.7, plts stable at 139. No overt bleeding or infusion issues per discussion with nursing about the assessment. Will continue current infusion rate.   Goal of Therapy:  Heparin level <0.3 units/ml unless adjusted by MD  Monitor platelets by anticoagulation protocol: Yes   Plan:  Continue heparin infusion at 400 units/hr Monitor daily HL, cbc, s/sx bleeding F/u switch to Xarelto if remains stable  Norina Buzzard, PharmD PGY1 Pharmacy Resident 08/08/2020 8:57 AM  Please check [email or web address] for unit-specific pharmacy phone numbers.  ADDENDUM: Pharmacy consulted to initiate xarelto for afib  Discontinue heparin Initiate  rivaroxaban 15mg  oral once daily with food due to renal function Nursing aware of plans, will administer with food and turn off heparin drip

## 2020-08-08 NOTE — NC FL2 (Signed)
[place] LEVEL OF CARE SCREENING TOOL     IDENTIFICATION  Patient Name: Cheryl Vargas Birthdate: 19-Jan-1935 Sex: female Admission Date (Current Location): 08/02/2020  [facility] [place] Number:  Herbalist and Address:  [facility] [place], [place] [address], [place], [place] [address]      Provider Number:    Attending Physician Name and Address:  Larey Dresser, MD  Relative Name and Phone Number:  [name] [phone]    Current Level of Care: SNF Recommended Level of Care: [place] Prior Approval Number:    Date Approved/Denied:   PASRR Number: [id number]  Discharge Plan: SNF    Current Diagnoses: Patient Active Problem List   Diagnosis Date Noted  . Acute on chronic systolic (congestive) heart failure ([place]) 08/02/2020  . Malnutrition of moderate degree 02/03/2020  . DNR (do not resuscitate) discussion   . Palliative care by specialist   . UTI (urinary tract infection) 02/01/2020  . Prolonged QT interval 02/01/2020  . Chronic anticoagulation 02/01/2020  . Transient hypotension 02/01/2020  . History of shingles 02/01/2020  . Near syncope 05/05/2019  . Hypercholesterolemia without hypertriglyceridemia 02/13/2018  . Syncope 01/21/2018  . Constipation 01/21/2018  . CHF exacerbation ([place]) 12/18/2017  . CKD (chronic kidney disease), stage III ([place]) 12/16/2017  . SOB (shortness of breath)   . Abdominal pain 09/18/2017  . CHF (congestive heart failure) ([place]) 09/17/2017  . Pulmonary edema   . Cardiac device in situ   . Ventricular tachycardia ([place])   . Cardiac arrest ([place])   . Severe mitral valve regurgitation   . A-fib ([place]) 09/03/2017  . Mitral regurgitation   . Acute on chronic systolic heart failure ([place])   . Non-ischemic cardiomyopathy ([place])   . CHF (congestive heart failure), NYHA class IV ([place]) 04/24/2016  . Dizziness 12/01/2015  . Hypertension 12/01/2015  . Insomnia 12/01/2015  . Chronic  combined systolic and diastolic CHF (congestive heart failure) ([place]) 12/01/2015  . Paroxysmal atrial fibrillation (HCC)   . Cardiac arrhythmia 10/20/2014  . Systolic dysfunction 15/17/6160  . Hypoventilation, idiopathic 10/20/2014  . Weakness 10/20/2014  . Closed left hip fracture ([place]) 03/17/2013  . Cardiomegaly 03/17/2013  . Frequent PVCs 03/17/2013  . Hyperlipidemia 03/17/2013    Orientation RESPIRATION BLADDER Height & Weight     Self, Time, Situation, Place  Normal Continent Weight: 131 lb 9.8 oz (59.7 kg) Height:  5\' 5"  (165.1 cm)  BEHAVIORAL SYMPTOMS/MOOD NEUROLOGICAL BOWEL NUTRITION STATUS      Continent Diet  AMBULATORY STATUS COMMUNICATION OF NEEDS Skin   Extensive Assist Verbally Normal                       Personal Care Assistance Level of Assistance  Bathing, Feeding, Dressing Bathing Assistance: Maximum assistance Feeding assistance: Limited assistance Dressing Assistance: Maximum assistance     Functional Limitations Info  Sight Sight Info: Adequate Hearing Info: Adequate      SPECIAL CARE FACTORS FREQUENCY  PT (By licensed PT), OT (By licensed OT)     PT Frequency: 5x per week OT Frequency: 5x per week            Contractures Contractures Info: Not present    Additional Factors Info  Code Status Code Status Info: DNR             Current Medications (08/08/2020):  This is the current hospital active medication list Current Facility-Administered Medications  Medication Dose Route Frequency  Provider Last Rate Last Admin  . acetaminophen (TYLENOL) tablet 650 mg  650 mg Oral Q4H PRN Blenda Nicely, RPH   650 mg at 08/07/20 2039  . [START ON 08/09/2020] amiodarone (PACERONE) tablet 200 mg  200 mg Oral Daily Larey Dresser, MD      . Chlorhexidine Gluconate Cloth 2 % PADS 6 each  6 each Topical Daily Larey Dresser, MD   6 each at 08/07/20 1624  . docusate sodium (COLACE) capsule 100 mg  100 mg Oral BID Blenda Nicely, RPH   100 mg  at 08/08/20 0849  . fentaNYL (SUBLIMAZE) bolus via infusion 25 mcg  25 mcg Intravenous Q15 min PRN Agarwala, Einar Grad, MD      . furosemide (LASIX) injection 40 mg  40 mg Intravenous Once Larey Dresser, MD      . LORazepam (ATIVAN) tablet 0.5 mg  0.5 mg Oral TID PRN Larey Dresser, MD   0.5 mg at 08/08/20 1041  . melatonin tablet 6 mg  6 mg Oral QHS Renae Fickle, MD   6 mg at 08/08/20 0225  . midodrine (PROAMATINE) tablet 10 mg  10 mg Oral TID WC Bensimhon, Shaune Pascal, MD   10 mg at 08/08/20 1138  . milrinone (PRIMACOR) 20 MG/100 ML (0.2 mg/mL) infusion  0.25 mcg/kg/min Intravenous Continuous Larey Dresser, MD 4.48 mL/hr at 08/08/20 1129 0.25 mcg/kg/min at 08/08/20 1129  . phenol (CHLORASEPTIC) mouth spray 1 spray  1 spray Mouth/Throat PRN Larey Dresser, MD   1 spray at 08/05/20 1722  . polyethylene glycol (MIRALAX / GLYCOLAX) packet 17 g  17 g Oral Daily Blenda Nicely, RPH   17 g at 08/08/20 0849  . Rivaroxaban (XARELTO) tablet 15 mg  15 mg Oral Q lunch Cala Bradford, RPH   15 mg at 08/08/20 1138  . sodium chloride flush (NS) 0.9 % injection 10-40 mL  10-40 mL Intracatheter PRN Larey Dresser, MD      . zolpidem [facility]) tablet 5 mg  5 mg Oral QHS PRN Larey Dresser, MD   5 mg at 08/07/20 2039     Discharge Medications: Please see discharge summary for a list of discharge medications.  Relevant Imaging Results:  Relevant Lab Results:   Additional Information [phone]  Elliot Gurney [place], [place]

## 2020-08-08 NOTE — Progress Notes (Signed)
Daily Progress Note   Patient Name: Cheryl Vargas       Date: 08/08/2020 DOB: [birth year]  Age: 85 y.o. MRN#: [id number] Attending Physician: Larey Dresser, MD Primary Care Physician: Lavone Orn, MD Admit Date: 08/02/2020  Reason for Consultation/Follow-up:  To discuss complex medical decision making related to patient's goals of care   Subjective: Patient not feeling as well today.  Had a nice conversation with son, Cheryl Vargas.  Patient is fragile.  Her status is tenuous.  I fear that if she to acute rehab with only Palliative she would quickly deteriorate and be re-hospitalized.   Suggested to [facility] to please consider looking for a more permanent solution on discharge.  He feels he would be happiest at home and he wants to do that for her particularly if her prognosis may only be weeks to months.   He will talk with his brother and sister about developing a plan to care for her in the home.  Spoke with patient at bedside and her daughter Cheryl Vargas.  Patient is worried about expense of care in the home.  Consequently she is considering SNF.  We discussed the options for a few minutes.     Her son Cheryl Vargas called.  We discussed SNF vs Home with Hospice.  I explained that she can have milrinone at home.  Suggested that if she has a limited prognosis (patient suggested less than 6 months today) perhaps getting her to a more permanent destination (home) rather than SNF would be easier on her and she would be happier.  Discussed utilizing an agency to supply 24 hour personal care in addition to Hospice.     Assessment: Patient with advanced HF and valvular disease.  Frail.  Not a good candidate for invasive procedures.   Patient Profile/HPI:  85 y.o. female  with past medical history of NICM, systolic  CHF EF <94%, HTN, HLD, afib (on Xarelto), s/p Medtronic CRT-D, CKD stage 3 admitted on 08/02/2020 with increased shortness of breath related to CHF exacerbation and worsening renal function. Hospitalization complicated by hemoptysis and has required intubation. Also requiring milrinone infusion.   Length of Stay: 6   Vital Signs: BP 100/70   Pulse 80   Temp 98.7 F (37.1 C) (Oral)   Resp 17   Ht 5\' 5"  (1.651 m)  Wt 59.7 kg   SpO2 92%   BMI 21.90 kg/m  SpO2: SpO2: 92 % O2 Device: O2 Device: High Flow Nasal Cannula O2 Flow Rate: O2 Flow Rate (L/min): 2 L/min       Palliative Assessment/Data: 40%     Palliative Care Plan    Recommendations/Plan: Family considering multiple discharge options.  Factors at play include whether or not she would have milrinone, providing good 24 hour care, and her contentment.  I've recommended home with hospice (with or without milrinone) and 24 hour private care. Does SNF accept milrinone?  Would need to check with TOC.  Code Status:  DNR  Prognosis:  < 6 months    Discharge Planning: To Be Determined  Care plan was discussed with patient, Cheryl Vargas and Cheryl Vargas.  Thank you for allowing the Palliative Medicine Team to assist in the care of this patient.  Total time spent:  65 min.     Greater than 50%  of this time was spent counseling and coordinating care related to the above assessment and plan.  [name], PA-C Palliative Medicine  Please contact Palliative MedicineTeam phone at [phone] for questions and concerns between 7 am - 7 pm.   Please see AMION for individual provider pager numbers.

## 2020-08-08 NOTE — TOC Initial Note (Addendum)
Transition of Care [facility]) - Initial/Assessment Note    Patient Details  Name: Cheryl Vargas MRN: [id number] Date of Birth: [birth year]  Transition of Care [facility] CM/SW Contact:    Elliot Gurney [place], [place] Phone Number: 08/08/2020, 1:07 PM  Clinical Narrative:                 Patient is a 85 year old female admitted on 11/22/2021with acute on chronic systolic heart failure. This Education officer, museum spoke with patient's son [name] to discuss patient's discharge plan. Patient's discussed his desire for patient to return home with Hospice, however will need to discuss this plan with his brother and sister before a final decision is made. He would also like patient to be worked up for a SNF as a back up plan.  Patient has received rehab at [facility] in the past and does not want to return there, however patient  would also like to be considered for [facility] as first choice.  PASRR obtained, FL2 completed and faxed out..  Transition of Care to follow up with patient's son [name] [phone] to confirm discharge plan.   Lurlie Wigen [address], LCSW Transition of Care [phone]   Expected Discharge Plan: Skilled Nursing Facility Barriers to Discharge: No Barriers Identified   Patient Goals and CMS Choice Patient states their goals for this hospitalization and ongoing recovery are:: "Be able to walk"      Expected Discharge Plan and Services Expected Discharge Plan: [place] In-house Referral: Clinical Social Work   Post Acute Care Choice: [place] Living arrangements for the past 2 months: [place]                                      Prior Living Arrangements/Services Living arrangements for the past 2 months: [place] Lives with:: Self Patient language and need for interpreter reviewed:: Yes Do you feel safe going back to the place where you live?: Yes      Need for Family Participation in Patient Care: Yes  (Comment) Care giver support system in place?: Yes (comment)   Criminal Activity/Legal Involvement Pertinent to Current Situation/Hospitalization: No - Comment as needed  Activities of Daily Living      Permission Sought/Granted   Permission granted to share information with : Yes, Verbal Permission Granted        Permission granted to share info w Relationship: [name]- Son  Permission granted to share info w Contact Information: [phone]  Emotional Assessment Appearance:: Appears older than stated age Attitude/Demeanor/Rapport: Engaged Affect (typically observed): Accepting Orientation: : Oriented to Self, Oriented to Place, Oriented to  Time Alcohol / Substance Use: Not Applicable Psych Involvement: No (comment)  Admission diagnosis:  Acute on chronic combined systolic (congestive) and diastolic (congestive) heart failure (HCC) [I50.43] Acute on chronic systolic (congestive) heart failure (HCC) [I50.23] Patient Active Problem List   Diagnosis Date Noted  . Acute on chronic systolic (congestive) heart failure ([place]) 08/02/2020  . Malnutrition of moderate degree 02/03/2020  . DNR (do not resuscitate) discussion   . Palliative care by specialist   . UTI (urinary tract infection) 02/01/2020  . Prolonged QT interval 02/01/2020  . Chronic anticoagulation 02/01/2020  . Transient hypotension 02/01/2020  . History of shingles 02/01/2020  . Near syncope 05/05/2019  . Hypercholesterolemia without hypertriglyceridemia 02/13/2018  . Syncope 01/21/2018  . Constipation 01/21/2018  . CHF exacerbation ([place]) 12/18/2017  .  CKD (chronic kidney disease), stage III ([place]) 12/16/2017  . SOB (shortness of breath)   . Abdominal pain 09/18/2017  . CHF (congestive heart failure) ([place]) 09/17/2017  . Pulmonary edema   . Cardiac device in situ   . Ventricular tachycardia ([place])   . Cardiac arrest ([place])   . Severe mitral valve regurgitation   . A-fib ([place]) 09/03/2017  . Mitral regurgitation    . Acute on chronic systolic heart failure ([place])   . Non-ischemic cardiomyopathy ([place])   . CHF (congestive heart failure), NYHA class IV ([place]) 04/24/2016  . Dizziness 12/01/2015  . Hypertension 12/01/2015  . Insomnia 12/01/2015  . Chronic combined systolic and diastolic CHF (congestive heart failure) ([place]) 12/01/2015  . Paroxysmal atrial fibrillation (HCC)   . Cardiac arrhythmia 10/20/2014  . Systolic dysfunction 66/81/5947  . Hypoventilation, idiopathic 10/20/2014  . Weakness 10/20/2014  . Closed left hip fracture ([place]) 03/17/2013  . Cardiomegaly 03/17/2013  . Frequent PVCs 03/17/2013  . Hyperlipidemia 03/17/2013   PCP:  Lavone Orn, MD Pharmacy:   [facility] [place], [place] [address] Phone: [phone] Fax: [phone]  [place], [place] - [address] [place] [address] Phone: [phone] Fax: [phone]  [facility] [place], [place] - [address] [place] [address] Phone: [phone] Fax: [phone]     Social Determinants of Health (SDOH) Interventions    Readmission Risk Interventions No flowsheet data found.

## 2020-08-08 NOTE — Progress Notes (Signed)
Patient ID: Cheryl Vargas, female   DOB: 07/26/1935, 85 y.o.   MRN: [id number]       Advanced Heart Failure Rounding Note  PCP-Cardiologist: [name], MD   Subjective:    Had localized pulmonary artery perforation during cath on 11/24. Intubated and underwent successful coil embolization with IR. Had VT x 1 during cath and shocked.  Extubated 11/25.   No further hemoptysis, on IV heparin.  Hgb 11.7.   This morning, co-ox has dropped to 41%.  CVP 12.  She feels worse today, nauseated and fatigued. SBP 90s.   Family has been discussing hospice, palliative care service following.   - ECHO EF 20-25% RV normal, biatrial enlargement, IVC normal.   - RHC 11/24  Procedural Findings (incomplete study due to emergency): Hemodynamics (mmHg) RA mean 6 PA 41/16, mean 26 PCWP mean 19 Oxygen saturations: PA 57% AO 93% Cardiac Output (Fick) 2.92  Cardiac Index (Fick) 1.8    Objective:   Weight Range: 59.7 kg Body mass index is 21.9 kg/m.   Vital Signs:   Temp:  [97.3 F (36.3 C)-98.7 F (37.1 C)] 98.7 F (37.1 C) (11/28 0700) Pulse Rate:  [79-81] 79 (11/28 0800) Resp:  [14-24] 14 (11/28 0800) BP: (90-107)/(46-65) 100/61 (11/28 0800) SpO2:  [91 %-98 %] 98 % (11/28 0800) Weight:  [59 kg-59.7 kg] 59.7 kg (11/28 0435) Last BM Date: 07/30/20  Weight change: Filed Weights   08/07/20 0500 08/07/20 1756 08/08/20 0435  Weight: 58.3 kg 59 kg 59.7 kg    Intake/Output:   Intake/Output Summary (Last 24 hours) at 08/08/2020 1016 Last data filed at 08/08/2020 0800 Gross per 24 hour  Intake 1167.7 ml  Output 700 ml  Net 467.7 ml      Physical Exam    General: NAD, frail.  Neck: JVP 10-12 cm, no thyromegaly or thyroid nodule.  Lungs: Clear to auscultation bilaterally with normal respiratory effort. CV: Lateral PMI.  Heart regular S1/S2, no S3/S4, 2/6 HSM LLSB/apex.  No peripheral edema.  Abdomen: Soft, nontender, no hepatosplenomegaly, no distention.  Skin: Intact  without lesions or rashes.  Neurologic: Alert and oriented x 3.  Psych: Normal affect. Extremities: No clubbing or cyanosis.  HEENT: Normal.    Telemetry   BiV paced 80, underlying AF. Personally reviewed  Labs    CBC Recent Labs    08/07/20 0354 08/08/20 0357  WBC 10.0 8.8  HGB 13.0 11.7*  HCT 38.4 34.3*  MCV 91.9 90.0  PLT 141* 818*   Basic Metabolic Panel Recent Labs    08/06/20 0402 08/06/20 0753 08/07/20 0354 08/08/20 0357  NA   < >  --  134* 132*  K   < >  --  4.4 3.7  CL   < >  --  103 101  CO2   < >  --  20* 21*  GLUCOSE   < >  --  104* 131*  BUN   < >  --  20 22  CREATININE   < >  --  1.18* 1.20*  CALCIUM   < >  --  7.9* 7.6*  MG  --  2.1  --   --    < > = values in this interval not displayed.   Liver Function Tests No results for input(s): AST, ALT, ALKPHOS, BILITOT, PROT, ALBUMIN in the last 72 hours. No results for input(s): LIPASE, AMYLASE in the last 72 hours. Cardiac Enzymes No results for input(s): CKTOTAL, CKMB, CKMBINDEX, TROPONINI in the  last 72 hours.  BNP: BNP (last 3 results) Recent Labs    09/27/19 1708 08/02/20 0832  BNP 1,032.7* 1,285.0*    ProBNP (last 3 results) No results for input(s): PROBNP in the last 8760 hours.   D-Dimer No results for input(s): DDIMER in the last 72 hours. Hemoglobin A1C No results for input(s): HGBA1C in the last 72 hours. Fasting Lipid Panel No results for input(s): CHOL, HDL, LDLCALC, TRIG, CHOLHDL, LDLDIRECT in the last 72 hours. Thyroid Function Tests No results for input(s): TSH, T4TOTAL, T3FREE, THYROIDAB in the last 72 hours.  Invalid input(s): FREET3  Other results:   Imaging    No results found.   Medications:     Scheduled Medications: . [START ON 08/09/2020] amiodarone  200 mg Oral Daily  . Chlorhexidine Gluconate Cloth  6 each Topical Daily  . docusate sodium  100 mg Oral BID  . furosemide  40 mg Intravenous Once  . melatonin  6 mg Oral QHS  . midodrine  10 mg  Oral TID WC  . polyethylene glycol  17 g Oral Daily  . potassium chloride  40 mEq Oral Once    Infusions: . milrinone      PRN Medications: acetaminophen, fentaNYL, LORazepam, phenol, sodium chloride flush, zolpidem    Patient Profile   Cheryl Vargas is an 85 year old with a history ofHTN, HLD, PAF (On Xarelto), and nonischemic cardiomyopathy with chronic systolic CHF. Cardiomyopathy dates back to at least 2014. Most recent ECHO 09/2019 showed EF < 20%.  Intolerant ranolazine, losartan,spironolactone, amiodarone, sotolol, and tikosyn.  Admitted with presyncope/volume overload.   Assessment/Plan   1. Hemoptysis: Due to small, isolated right PA branch rupture on 11/24. She was intubated and underwent bronchoscopy with clearing of blood. Suspect bleeding contained quickly. S/p coiling PA branch in IR 11/24.  Hgb stable 11.7.  No hemoptysis. - Can transition back to Xarelto today.  2. Acute on chronic systolic HF: [place] 05/8337 showed no significant coronary disease. Cardiac MRI 10/17 EF 36%, diffuse HK, normal RV, biatrial enlargement, moderate MR, non-coronary LGE pattern involving the mid-wall of the basal to mid septum and inferior wall. Possibly prior myocarditis versus a form of infiltrative disease. Medtronic CRT-D s/p AV nodal ablation. Echo in 1/21 showed EF <20% with moderate LV dilation and mildly decreased RV systolic function. Low output on 1/21 RHC (CI 1.72). With age and frailty, LVAD not thought to be good option. At last appointment, she was not BiV pacing at an ideal percentage, likely due to PVCs. Symptomatically worse, NYHA class IIIb since decreasing Lasix in setting of rising creatinine. Low output HF noted this admission as well, CI on RHC was 1.8.  She was on milrinone 0.125 prior to [place].  EP has seen, CRT device adjusted and QRS now narrower.  Echo this admission with EF 20-25%, RV low normal systolic function, severe MR and TR.  She diuresed well this  admission, PCWP 19 on RHC.  As above, was intubated with pulmonary hemorrhage post-RHC, now extubated and stable.  Off NE and milrinone currently. CVP 12 today, co-ox has dropped to 41% and feels worse.  - Continue midodrine 10 mg tid.  - Start milrinone 0.25 mcg/kg/min.  We have discussed going home on palliative milrinone if it helps her to feel better, with [facility] eventually (hopefully will cover milrinone).  We have discussed going to rehab prior to going home to get her strength up as she lives alone.  - Got po Lasix this morning,  will give IV dose this afternoon.  -Hold bisoprolol as retrying amiodarone as below.  -Off digoxin with persistently elevated level despite cutting back to 0.0625 mg qod. - Concerned that frequent PVCs may be affecting function (and also limiting BiV pacing percentage) => Retrying her on amiodarone. Nausea may be due to low output HF (versus amiodarone).  - She does not qualify for Batwire given age 38. Atrial fibrillation: Chronic, s/p AV nodal ablation and BiV pacing.  - Stop heparin gtt today and will restart Xarelto.   4. PVCs/VT: Frequent PVCs have limited BiV pacing. Not a good PVC ablation candidate per EP. Had VT in 1/21. Cannot tolerate amiodarone, sotolol, or Tikosyn. Did not tolerate mexilitine with nausea. Had nausea also with ranolazine. Additionally, did not tolerate further uptitration of bisoprolol.  VT during pulmonary hemorrhage episode in setting of low oxygen saturation.  - Continue amiodarone but with nausea (?due to amiodarone versus low output HF), will decrease to 200 mg daily.  5. Mitral regurgitation: I reviewed her echo.  At least 3+ MR, suspect functional due to dilated annulus with severely dilated LA.  TR appears worse (clearly severe).  Discussed with Dr. Burt Knack, do not think that she would be a good Mitraclip candidate as TR is clearly worse than MR.  6. AKI on CKD stage 3: Creatinine 1.2 today, stable.  - Daily BMET 7.  Acute hypoxemic respiratory failure: Intubated in setting of hemoptysis. CCM following.   - extubated 11/25 8. Hypokalemia - Stable 9. Debility - PT/OT consult => recommend SNF.   Still in discussion about disposition.  Possible to rehab to help with strength/independence then home with home milrinone and [facility] hosp[facility].    [name] 08/08/2020 10:16 AM

## 2020-08-09 ENCOUNTER — Telehealth (HOSPITAL_COMMUNITY): Payer: Self-pay | Admitting: *Deleted

## 2020-08-09 DIAGNOSIS — I5043 Acute on chronic combined systolic (congestive) and diastolic (congestive) heart failure: Secondary | ICD-10-CM

## 2020-08-09 LAB — BASIC METABOLIC PANEL
Anion gap: 11 (ref 5–15)
BUN: 22 mg/dL (ref 8–23)
CO2: 25 mmol/L (ref 22–32)
Calcium: 7.8 mg/dL — ABNORMAL LOW (ref 8.9–10.3)
Chloride: 97 mmol/L — ABNORMAL LOW (ref 98–111)
Creatinine, Ser: 1.17 mg/dL — ABNORMAL HIGH (ref 0.44–1.00)
GFR, Estimated: 46 mL/min — ABNORMAL LOW (ref >60)
Glucose, Bld: 98 mg/dL (ref 70–99)
Potassium: 3.3 mmol/L — ABNORMAL LOW (ref 3.5–5.1)
Sodium: 133 mmol/L — ABNORMAL LOW (ref 135–145)

## 2020-08-09 LAB — COOXEMETRY PANEL
Carboxyhemoglobin: 1.4 % (ref 0.5–1.5)
Carboxyhemoglobin: 1.5 % (ref 0.5–1.5)
Methemoglobin: 0.7 % (ref 0.0–1.5)
Methemoglobin: 0.8 % (ref 0.0–1.5)
O2 Saturation: 46.8 %
O2 Saturation: 61.7 %
Total hemoglobin: 12 g/dL (ref 12.0–16.0)
Total hemoglobin: 12.7 g/dL (ref 12.0–16.0)

## 2020-08-09 LAB — CBC
HCT: 36.1 % (ref 36.0–46.0)
Hemoglobin: 12.3 g/dL (ref 12.0–15.0)
MCH: 30.8 pg (ref 26.0–34.0)
MCHC: 34.1 g/dL (ref 30.0–36.0)
MCV: 90.3 fL (ref 80.0–100.0)
Platelets: 173 10*3/uL (ref 150–400)
RBC: 4 MIL/uL (ref 3.87–5.11)
RDW: 12.9 % (ref 11.5–15.5)
WBC: 9.2 10*3/uL (ref 4.0–10.5)
nRBC: 0 % (ref 0.0–0.2)

## 2020-08-09 MED ORDER — POTASSIUM CHLORIDE CRYS ER 20 MEQ PO TBCR
40.0000 meq | EXTENDED_RELEASE_TABLET | Freq: Once | ORAL | Status: AC
  Administered 2020-08-09: 40 meq via ORAL
  Filled 2020-08-09: qty 2

## 2020-08-09 MED ORDER — FUROSEMIDE 40 MG PO TABS
40.0000 mg | ORAL_TABLET | Freq: Every day | ORAL | Status: DC
  Administered 2020-08-09 – 2020-08-11 (×3): 40 mg via ORAL
  Filled 2020-08-09 (×3): qty 1

## 2020-08-09 MED ORDER — RIVAROXABAN 15 MG PO TABS
15.0000 mg | ORAL_TABLET | Freq: Every day | ORAL | Status: DC
  Administered 2020-08-09 – 2020-08-17 (×9): 15 mg via ORAL
  Filled 2020-08-09 (×9): qty 1

## 2020-08-09 NOTE — Telephone Encounter (Signed)
pts son [name] left VM asking if milrinone is an option for pt. He said he had some questions for Dr.McLean to help with some decisions once patient is discharged. Pts son said he has been a physical therapist for 20 years and just has some concerns about physical therapy and patient being on milrinone. He requests a call back directly from [place] .   Routed to [place]

## 2020-08-09 NOTE — TOC Progression Note (Signed)
Transition of Care [facility]) - Progression Note    Patient Details  Name: Cheryl Vargas MRN: [id number] Date of Birth: [birth year]  Transition of Care [facility]) CM/SW [place], [place] Phone Number: 08/09/2020, 2:29 PM  Clinical Narrative:    CSW spoke with pt's son [name] at length about dc plans. [name] states at this time, he req pt to do SNF short term and not involve hospice, CSW went over current offers of [facility] [facility]. Paul req [facility]. CSW will reach out to [facility] to see if they can review the referral. [name] will follow up with CSW tomorrow.    Expected Discharge Plan: [place] Barriers to Discharge: No Barriers Identified  Expected Discharge Plan and Services Expected Discharge Plan: [place] In-house Referral: Clinical Social Work   Post Acute Care Choice: [place] Living arrangements for the past 2 months: [place]                                       Social Determinants of Health (SDOH) Interventions    Readmission Risk Interventions No flowsheet data found.

## 2020-08-09 NOTE — Progress Notes (Signed)
Physical Therapy Treatment Patient Details Name: Cheryl Vargas MRN: [id number] DOB: July 06, 1935 Today's Date: 08/09/2020    History of Present Illness Pt is an 85 year old woman admtted on 08/04/20 with acute on chronic hear failure. Hospital course complicated by sudden massive hemoptysis during cardiac catherization on 11/24 requiring emergent intubation and bronchoscopy. Extubated 11/25. PMH: NICM with EF 20%, HTN, HLD, PAF.    PT Comments    Pt agreeable to therapy session. Pt requiring min-mod assist for functional mobility (+2 safety/equipment). Ambulating 10 feet with a walker; assist required for negotiation, particularly with turns. Pt displays balance impairments, weakness, and decreased endurance. Continue to recommend SNF for ongoing Physical Therapy.     Follow Up Recommendations  SNF;Supervision/Assistance - 24 hour     Equipment Recommendations  None recommended by PT    Recommendations for Other Services       Precautions / Restrictions Precautions Precautions: Fall Precaution Comments: watch 02 Restrictions Weight Bearing Restrictions: No    Mobility  Bed Mobility Overal bed mobility: Needs Assistance Bed Mobility: Supine to Sit     Supine to sit: Min assist     General bed mobility comments: MinA for trunk to upright  Transfers Overall transfer level: Needs assistance Equipment used: Rolling walker (2 wheeled) Transfers: Sit to/from Stand Sit to Stand: Mod assist         General transfer comment: ModA to power up to standing from edge of bed  Ambulation/Gait Ambulation/Gait assistance: Min assist;+2 safety/equipment Gait Distance (Feet): 10 Feet Assistive device: Rolling walker (2 wheeled) Gait Pattern/deviations: Step-to pattern;Shuffle;Trunk flexed;Narrow base of support;Drifts right/left;Decreased stride length Gait velocity: decreased Gait velocity interpretation: <1.31 ft/sec, indicative of household ambulator General Gait Details:  Pt with bilateral knee instability, requiring minA for balance, +2 safety with equipment. Manual assist provided for walker negotiation   Stairs             Wheelchair Mobility    Modified Rankin (Stroke Patients Only)       Balance Overall balance assessment: Needs assistance Sitting-balance support: Feet supported Sitting balance-Leahy Scale: Fair     Standing balance support: Bilateral upper extremity supported Standing balance-Leahy Scale: Poor Standing balance comment: reliant on external support                            Cognition Arousal/Alertness: Awake/alert Behavior During Therapy: Flat affect Overall Cognitive Status: Within Functional Limits for tasks assessed                                 General Comments: WFL for basic mobility, higher cognition not assessed this session. Pt able to verbalize needs      Exercises      General Comments General comments (skin integrity, edema, etc.): VSS on 5L O2      Pertinent Vitals/Pain Pain Assessment: No/denies pain    Home Living                      Prior Function            PT Goals (current goals can now be found in the care plan section) Acute Rehab PT Goals Patient Stated Goal: to go home PT Goal Formulation: With patient Time For Goal Achievement: 08/20/20 Potential to Achieve Goals: Good Progress towards PT goals: Progressing toward goals    Frequency    Min  3X/week      PT Plan Current plan remains appropriate    Co-evaluation              AM-PAC PT "6 Clicks" Mobility   Outcome Measure  Help needed turning from your back to your side while in a flat bed without using bedrails?: A Little Help needed moving from lying on your back to sitting on the side of a flat bed without using bedrails?: A Little Help needed moving to and from a bed to a chair (including a wheelchair)?: A Lot Help needed standing up from a chair using your arms  (e.g., wheelchair or bedside chair)?: A Lot Help needed to walk in hospital room?: A Little Help needed climbing 3-5 steps with a railing? : A Lot 6 Click Score: 15    End of Session Equipment Utilized During Treatment: Gait belt;Oxygen Activity Tolerance: Patient tolerated treatment well Patient left: in chair;with call bell/phone within reach Nurse Communication: Mobility status PT Visit Diagnosis: Muscle weakness (generalized) (M62.81);Unsteadiness on feet (R26.81)     Time: 7215-8727 PT Time Calculation (min) (ACUTE ONLY): 23 min  Charges:  $Therapeutic Activity: 23-37 mins                     Wyona Almas, PT, DPT Acute Rehabilitation Services Pager [phone] Office [phone]    Deno Etienne 08/09/2020, 1:39 PM

## 2020-08-09 NOTE — Progress Notes (Signed)
Palliative Medicine RN Note: Spoke with Mrs Rabalais's son [name]. [name] and his brother would like to take a day off from PMT visits for today and let her rest and have a follow up visit tomorrow.  Haynes Dage, who saw Mrs Griner yesterday, is not here tomorrow. When her family calls me back with a time to meet, we will assign a provider for that meeting.  Marjie Skiff Modell Fendrick, RN, BSN, [facility] Palliative Medicine Team 08/09/2020 8:49 AM Office [phone]

## 2020-08-09 NOTE — Progress Notes (Addendum)
Patient ID: Cheryl Vargas, female   DOB: [birth year], 85 y.o.   MRN: [id number]       Advanced Heart Failure Rounding Note  PCP-Cardiologist: [name], MD   Subjective:    Had localized pulmonary artery perforation during cath on 11/24. Intubated and underwent successful coil embolization with IR. Had VT x 1 during cath and shocked.  Extubated 11/25. Slight blood-tinged sputum.   Yesterday milrinone 0.25 mcg restarted for low CO-OX. Todays CO-OX 47%. Remains on midodrine 10 tid. Hgb stable at 12.3  Slept a little better last night. Complaining of fatigue.   Family has been discussing hospice, palliative care service following.   - ECHO EF 20-25% RV normal, biatrial enlargement, IVC normal.   - RHC 11/24  Procedural Findings (incomplete study due to emergency): Hemodynamics (mmHg) RA mean 6 PA 41/16, mean 26 PCWP mean 19 Oxygen saturations: PA 57% AO 93% Cardiac Output (Fick) 2.92  Cardiac Index (Fick) 1.8    Objective:   Weight Range: 60 kg Body mass index is 22.01 kg/m.   Vital Signs:   Temp:  [97.6 F (36.4 C)-98.6 F (37 C)] 98.6 F (37 C) (11/29 0437) Pulse Rate:  [78-84] 81 (11/29 0437) Resp:  [14-24] 18 (11/29 0437) BP: (90-105)/(49-78) 100/49 (11/29 0437) SpO2:  [92 %-98 %] 95 % (11/29 0437) Weight:  [60 kg] 60 kg (11/29 0035) Last BM Date: 07/30/20  Weight change: Filed Weights   08/07/20 1756 08/08/20 0435 08/09/20 0035  Weight: 59 kg 59.7 kg 60 kg    Intake/Output:   Intake/Output Summary (Last 24 hours) at 08/09/2020 0752 Last data filed at 08/09/2020 0600 Gross per 24 hour  Intake 866.12 ml  Output 2225 ml  Net -1358.88 ml      Physical Exam   CVP 5-6  General:  Appears frail.  No resp difficulty HEENT: normal Neck: supple. no JVD. Carotids 2+ bilat; no bruits. No lymphadenopathy or thryomegaly appreciated. Cor: PMI nondisplaced. Regular rate & rhythm. No rubs, gallops. 2/6 HSM LLSB. Lungs: clear Abdomen: soft, nontender,  nondistended. No hepatosplenomegaly. No bruits or masses. Good bowel sounds. Extremities: no cyanosis, clubbing, rash, edema. RUE PICC  Neuro: alert & orientedx3, cranial nerves grossly intact. moves all 4 extremities w/o difficulty. Affect flat    Telemetry   BiV paced 80, underlying AF. Personally reviewed  Labs    CBC Recent Labs    08/08/20 0357 08/09/20 0543  WBC 8.8 9.2  HGB 11.7* 12.3  HCT 34.3* 36.1  MCV 90.0 90.3  PLT 138* 209   Basic Metabolic Panel Recent Labs    08/06/20 0753 08/07/20 0354 08/08/20 0357 08/09/20 0543  NA  --    < > 132* 133*  K  --    < > 3.7 3.3*  CL  --    < > 101 97*  CO2  --    < > 21* 25  GLUCOSE  --    < > 131* 98  BUN  --    < > 22 22  CREATININE  --    < > 1.20* 1.17*  CALCIUM  --    < > 7.6* 7.8*  MG 2.1  --   --   --    < > = values in this interval not displayed.   Liver Function Tests No results for input(s): AST, ALT, ALKPHOS, BILITOT, PROT, ALBUMIN in the last 72 hours. No results for input(s): LIPASE, AMYLASE in the last 72 hours. Cardiac Enzymes No results for  input(s): CKTOTAL, CKMB, CKMBINDEX, TROPONINI in the last 72 hours.  BNP: BNP (last 3 results) Recent Labs    09/27/19 1708 08/02/20 0832  BNP 1,032.7* 1,285.0*    ProBNP (last 3 results) No results for input(s): PROBNP in the last 8760 hours.   D-Dimer No results for input(s): DDIMER in the last 72 hours. Hemoglobin A1C No results for input(s): HGBA1C in the last 72 hours. Fasting Lipid Panel No results for input(s): CHOL, HDL, LDLCALC, TRIG, CHOLHDL, LDLDIRECT in the last 72 hours. Thyroid Function Tests No results for input(s): TSH, T4TOTAL, T3FREE, THYROIDAB in the last 72 hours.  Invalid input(s): FREET3  Other results:   Imaging    No results found.   Medications:     Scheduled Medications: . amiodarone  200 mg Oral Daily  . Chlorhexidine Gluconate Cloth  6 each Topical Daily  . docusate sodium  100 mg Oral BID  . melatonin   6 mg Oral QHS  . midodrine  10 mg Oral TID WC  . polyethylene glycol  17 g Oral Daily  . rivaroxaban  15 mg Oral Q lunch    Infusions: . milrinone 0.25 mcg/kg/min (08/09/20 0524)    PRN Medications: acetaminophen, fentaNYL, LORazepam, phenol, sodium chloride flush, zolpidem    Patient Profile   Ms Cornman is an 85 year old with a history ofHTN, HLD, PAF (On Xarelto), and nonischemic cardiomyopathy with chronic systolic CHF. Cardiomyopathy dates back to at least 2014. Most recent ECHO 09/2019 showed EF < 20%.  Intolerant ranolazine, losartan,spironolactone, amiodarone, sotolol, and tikosyn.  Admitted with presyncope/volume overload.   Assessment/Plan   1. Hemoptysis: Due to small, isolated right PA branch rupture on 11/24. She was intubated and underwent bronchoscopy with clearing of blood. Suspect bleeding contained quickly. S/p coiling PA branch in IR 11/24.  Hgb stable 12.  Scant blood-tinged sputum.  - Heparin stopped 11/28. Restarting xarelto today.   2. Acute on chronic systolic HF: [place] 0/9323 showed no significant coronary disease. Cardiac MRI 10/17 EF 36%, diffuse HK, normal RV, biatrial enlargement, moderate MR, non-coronary LGE pattern involving the mid-wall of the basal to mid septum and inferior wall. Possibly prior myocarditis versus a form of infiltrative disease. Medtronic CRT-D s/p AV nodal ablation. Echo in 1/21 showed EF <20% with moderate LV dilation and mildly decreased RV systolic function. Low output on 1/21 RHC (CI 1.72). With age and frailty, LVAD not thought to be good option. At last appointment, she was not BiV pacing at an ideal percentage, likely due to PVCs. Symptomatically worse, NYHA class IIIb since decreasing Lasix in setting of rising creatinine. Low output HF noted this admission as well, CI on RHC was 1.8.  She was on milrinone 0.125 prior to [place].  EP has seen, CRT device adjusted and QRS now narrower.  Echo this admission with EF 20-25%,  RV low normal systolic function, severe MR and TR.  She diuresed well this admission, PCWP 19 on RHC.  As above, was intubated with pulmonary hemorrhage post-RHC, now extubated and stable.  Off NE and milrinone currently.  CO-OX dropped to 41% so milrinone 0.25 mcg restarted. CO-OX 47% today.   CVP 5-6. Volume status stable.   - Continue midodrine 10 mg tid.  -  Will then need to decide on home with palliative milrinone.  If goes home on milrinone and chooses hospice, will need [facility] most likely. - Continue po Lasix 40 mg daily.  -Hold bisoprolol as retrying amiodarone as below.  -Off digoxin with  persistently elevated level despite cutting back to 0.0625 mg qod. - Concerned that frequent PVCs may be affecting function (and also limiting BiV pacing percentage) => Retrying her on amiodarone. Tolerating so far.  - She does not qualify for Batwire given age 88. Atrial fibrillation: Chronic, s/p AV nodal ablation and BiV pacing.  -Back on xarelto today.  4. PVCs/VT: Frequent PVCs have limited BiV pacing. Not a good PVC ablation candidate per EP. Had VT in 1/21. Cannot tolerate amiodarone, sotolol, or Tikosyn. Did not tolerate mexilitine with nausea. Had nausea also with ranolazine. Additionally, did not tolerate further uptitration of bisoprolol.  VT during pulmonary hemorrhage episode in setting of low oxygen saturation.  - Continue po amiodarone.  5. Mitral regurgitation: Per Dr Aundra Dubin --> At least 3+ MR, suspect functional due to dilated annulus with severely dilated LA.  TR appears worse (clearly severe).  Discussed with Dr. Burt Knack, do not think that she would be a good Mitraclip candidate as TR is clearly worse than MR.  6. AKI on CKD stage 3: Creatinine 1.17 today, stable.  - Daily BMET 7. Acute hypoxemic respiratory failure: Intubated in setting of hemoptysis. CCM following.   - extubated 11/25 - Stable today on 4 liters [place]>  8. Hypokalemia - Low today. Supp K.  9. Debility - PT/OT  consult => recommend SNF. Family trying to decide disposition.  10. [place] following. DNR   Will need to d/c on milrinone. TOC following.   Repeat CO-OX   [name] Clegg NP-C  08/09/2020 7:52 AM  Patient seen with NP, agree with the above note.   She feels better this morning on milrinone.  Repeat co-ox 62%.  Creatinine stable 1.17 and hgb stable.  Has not been out of bed. Got IV Lasix yesterday, CVP 5-6 today.   General: NAD Neck: No JVD, no thyromegaly or thyroid nodule.  Lungs: Clear to auscultation bilaterally with normal respiratory effort. CV: Nondisplaced PMI.  Heart regular S1/S2, no S3/S4, 2/6 HSM LLSB.  No peripheral edema.   Abdomen: Soft, nontender, no hepatosplenomegaly, no distention.  Skin: Intact without lesions or rashes.  Neurologic: Alert and oriented x 3.  Psych: Normal affect. Extremities: No clubbing or cyanosis.  HEENT: Normal.   No further hemoptysis, back on Xarelto.   Restarting po Lasix 40 mg daily today.   BP stable on midodrine, continue.   Co-ox 62% on milrinone 0.25.  Feels better on milrinone. Plan for home with palliative milrinone infusion.   - Suspect best plan here will be to rehab on milrinone to improve her independence/mobility, then home on hospice.  Hopefully [facility] hosp[facility] will allow palliative milrinone.  Need PT evaluation and mobilization to determine need for rehab, hopefully will be done today.  Social work involved.  Palliative care is following.   [name] 08/09/2020 11:47 AM

## 2020-08-09 NOTE — TOC Progression Note (Signed)
Transition of Care [facility]) - Progression Note    Patient Details  Name: ELLAINA SCHULER MRN: [id number] Date of Birth: [birth year]  Transition of Care [facility]) CM/SW [place], [place] Phone Number: 08/09/2020, 2:07 PM  Clinical Narrative:    CSW reached out to pt's son in reference to dc plans, had to leave a vm.    Expected Discharge Plan: [place] Barriers to Discharge: No Barriers Identified  Expected Discharge Plan and Services Expected Discharge Plan: [place] In-house Referral: Clinical Social Work   Post Acute Care Choice: [place] Living arrangements for the past 2 months: [place]                                       Social Determinants of Health (SDOH) Interventions    Readmission Risk Interventions No flowsheet data found.

## 2020-08-09 NOTE — Telephone Encounter (Signed)
We are discussing home milrinone. Will call him in am.

## 2020-08-09 NOTE — Care Management Important Message (Signed)
Important Message  Patient Details  Name: Cheryl Vargas MRN: [id number] Date of Birth: 06-Jul-1935   Medicare Important Message Given:  Yes     [name] 08/09/2020, 9:02 AM

## 2020-08-10 DIAGNOSIS — I5043 Acute on chronic combined systolic (congestive) and diastolic (congestive) heart failure: Secondary | ICD-10-CM | POA: Diagnosis not present

## 2020-08-10 DIAGNOSIS — Z515 Encounter for palliative care: Secondary | ICD-10-CM | POA: Diagnosis not present

## 2020-08-10 DIAGNOSIS — Z7189 Other specified counseling: Secondary | ICD-10-CM | POA: Diagnosis not present

## 2020-08-10 LAB — CBC
HCT: 33.4 % — ABNORMAL LOW (ref 36.0–46.0)
Hemoglobin: 11.4 g/dL — ABNORMAL LOW (ref 12.0–15.0)
MCH: 30.6 pg (ref 26.0–34.0)
MCHC: 34.1 g/dL (ref 30.0–36.0)
MCV: 89.8 fL (ref 80.0–100.0)
Platelets: 159 10*3/uL (ref 150–400)
RBC: 3.72 MIL/uL — ABNORMAL LOW (ref 3.87–5.11)
RDW: 13 % (ref 11.5–15.5)
WBC: 6.8 10*3/uL (ref 4.0–10.5)
nRBC: 0 % (ref 0.0–0.2)

## 2020-08-10 LAB — BASIC METABOLIC PANEL
Anion gap: 6 (ref 5–15)
BUN: 17 mg/dL (ref 8–23)
CO2: 25 mmol/L (ref 22–32)
Calcium: 7.5 mg/dL — ABNORMAL LOW (ref 8.9–10.3)
Chloride: 99 mmol/L (ref 98–111)
Creatinine, Ser: 1.09 mg/dL — ABNORMAL HIGH (ref 0.44–1.00)
GFR, Estimated: 50 mL/min — ABNORMAL LOW (ref >60)
Glucose, Bld: 89 mg/dL (ref 70–99)
Potassium: 3.4 mmol/L — ABNORMAL LOW (ref 3.5–5.1)
Sodium: 130 mmol/L — ABNORMAL LOW (ref 135–145)

## 2020-08-10 LAB — COOXEMETRY PANEL
Carboxyhemoglobin: 1.4 % (ref 0.5–1.5)
Methemoglobin: 0.7 % (ref 0.0–1.5)
O2 Saturation: 58.2 %
Total hemoglobin: 12 g/dL (ref 12.0–16.0)

## 2020-08-10 MED ORDER — LORAZEPAM 0.5 MG PO TABS
0.2500 mg | ORAL_TABLET | Freq: Three times a day (TID) | ORAL | Status: DC | PRN
  Administered 2020-08-10 – 2020-08-12 (×8): 0.5 mg via ORAL
  Administered 2020-08-13: 0.25 mg via ORAL
  Administered 2020-08-13: 0.5 mg via ORAL
  Administered 2020-08-14: 0.25 mg via ORAL
  Administered 2020-08-14: 0.5 mg via ORAL
  Administered 2020-08-15 (×2): 0.25 mg via ORAL
  Administered 2020-08-15 – 2020-08-18 (×8): 0.5 mg via ORAL
  Filled 2020-08-10 (×25): qty 1

## 2020-08-10 MED ORDER — POTASSIUM CHLORIDE CRYS ER 10 MEQ PO TBCR
40.0000 meq | EXTENDED_RELEASE_TABLET | Freq: Two times a day (BID) | ORAL | Status: DC
  Administered 2020-08-10 – 2020-08-12 (×6): 40 meq via ORAL
  Filled 2020-08-10 (×9): qty 4

## 2020-08-10 MED ORDER — POTASSIUM CHLORIDE ER 10 MEQ PO TBCR
40.0000 meq | EXTENDED_RELEASE_TABLET | Freq: Every day | ORAL | Status: DC
  Filled 2020-08-10: qty 4

## 2020-08-10 NOTE — Progress Notes (Signed)
Physical Therapy Treatment Patient Details Name: Cheryl Vargas MRN: [id number] DOB: 11-19-1934 Today's Date: 08/10/2020    History of Present Illness Pt is an 85 year old woman admtted on 08/04/20 with acute on chronic hear failure. Hospital course complicated by sudden massive hemoptysis during cardiac catherization on 11/24 requiring emergent intubation and bronchoscopy. Extubated 11/25. PMH: NICM with EF 20%, HTN, HLD, PAF.    PT Comments    Pt progressing towards physical therapy goals, remains motivated to participate. Requiring min assist for functional mobility. Ambulating 15 feet with a walker and close chair follow. Further distance limited by pt need to have a bowel movement and assisted her onto [facility]. Rest of session focused on therapeutic exercises for BLE strengthening.     Follow Up Recommendations  SNF;Supervision/Assistance - 24 hour     Equipment Recommendations  None recommended by PT    Recommendations for Other Services       Precautions / Restrictions Precautions Precautions: Fall Precaution Comments: watch 02 Restrictions Weight Bearing Restrictions: No    Mobility  Bed Mobility Overal bed mobility: Needs Assistance Bed Mobility: Supine to Sit     Supine to sit: Min assist     General bed mobility comments: Cues for initiation, assist for trunk to upright  Transfers Overall transfer level: Needs assistance Equipment used: Rolling walker (2 wheeled) Transfers: Sit to/from Stand Sit to Stand: Min assist         General transfer comment: MinA to rise to standing, cues for hand placement  Ambulation/Gait Ambulation/Gait assistance: Min assist;+2 safety/equipment Gait Distance (Feet): 15 Feet Assistive device: Rolling walker (2 wheeled) Gait Pattern/deviations: Step-to pattern;Shuffle;Trunk flexed;Narrow base of support;Decreased stride length Gait velocity: decreased Gait velocity interpretation: <1.31 ft/sec, indicative of household  ambulator General Gait Details: Cues for larger step lengths, minA for balance, chair follow utilized. Distance limited by need for bowel movement   Stairs             Wheelchair Mobility    Modified Rankin (Stroke Patients Only)       Balance Overall balance assessment: Needs assistance Sitting-balance support: Feet supported Sitting balance-Leahy Scale: Fair     Standing balance support: Bilateral upper extremity supported Standing balance-Leahy Scale: Poor Standing balance comment: reliant on external support                            Cognition Arousal/Alertness: Awake/alert Behavior During Therapy: Flat affect Overall Cognitive Status: Impaired/Different from baseline Area of Impairment: Problem solving                             Problem Solving: Slow processing;Requires verbal cues General Comments: Pt able to verbalize needs      Exercises General Exercises - Lower Extremity Long Arc Quad: Both;10 reps;Seated Heel Slides: Both;5 reps;Supine Straight Leg Raises: Both;5 reps;Supine Hip Flexion/Marching: Both;10 reps;Seated    General Comments General comments (skin integrity, edema, etc.): VSS on 4L O2      Pertinent Vitals/Pain Pain Assessment: Faces Faces Pain Scale: Hurts little more Pain Location: right hip Pain Descriptors / Indicators: Discomfort;Grimacing Pain Intervention(s): Monitored during session;Limited activity within patient's tolerance    Home Living                      Prior Function            PT Goals (current goals can now  be found in the care plan section) Acute Rehab PT Goals Patient Stated Goal: to go home PT Goal Formulation: With patient Time For Goal Achievement: 08/20/20 Potential to Achieve Goals: Good Progress towards PT goals: Progressing toward goals    Frequency    Min 3X/week      PT Plan Current plan remains appropriate    Co-evaluation               AM-PAC PT "6 Clicks" Mobility   Outcome Measure  Help needed turning from your back to your side while in a flat bed without using bedrails?: None Help needed moving from lying on your back to sitting on the side of a flat bed without using bedrails?: A Little Help needed moving to and from a bed to a chair (including a wheelchair)?: A Little Help needed standing up from a chair using your arms (e.g., wheelchair or bedside chair)?: A Little Help needed to walk in hospital room?: A Little Help needed climbing 3-5 steps with a railing? : A Lot 6 Click Score: 18    End of Session Equipment Utilized During Treatment: Gait belt;Oxygen Activity Tolerance: Patient tolerated treatment well Patient left: in chair;with call bell/phone within reach Nurse Communication: Mobility status PT Visit Diagnosis: Muscle weakness (generalized) (M62.81);Unsteadiness on feet (R26.81)     Time: 3762-8315 PT Time Calculation (min) (ACUTE ONLY): 36 min  Charges:  $Therapeutic Exercise: 8-22 mins $Therapeutic Activity: 8-22 mins                     Wyona Almas, PT, DPT Acute Rehabilitation Services Pager [phone] Office [phone]    Deno Etienne 08/10/2020, 3:51 PM

## 2020-08-10 NOTE — Progress Notes (Addendum)
Palliative:  HPI: 85 y.o. female  with past medical history of NICM, systolic CHF EF <20%, HTN, HLD, afib (on Xarelto), s/p Medtronic CRT-D, CKD stage 3 admitted on 08/02/2020 with increased shortness of breath related to CHF exacerbation and worsening renal function. Hospitalization complicated by hemoptysis and has required intubation. Also requiring milrinone infusion. MOST form in records completed by outpatient palliative care for following: DNR, limited interventions (would consider ICU admission but no desire for intubation), ok with antibiotics, ok with tube feedings depending on long term prognosis.   I met today at Cheryl Vargas's bedside but no family present. Cheryl Vargas is awake and has many questions for me. Her main concern is where she goes from here. She is worried that she does not have enough help at home and expresses desire for rehab trial as she has nobody at home available to her 24/7. She expresses interest in [facility] and [facility] saying she has friends at these facilities. She does express that she wants to continue with milrinone infusion today. She did ask about the meaning of DNR and when explained that these interventions are put in place with CPR, defibrillation, intubation in the process of dying when heart/lungs fail she confirms that she does not want these interventions. However, she would want some interventions to prevent her from getting to point of needing resuscitation. We reviewed MOST form for DNR, limited interventions, antibiotics if indicated, IVF if indicated, and trial of feeding tube she does not wish to make any changes at this time. She does express being very overwhelmed by all the decisions that face her. I reassure her that we will figure out a good plan and that I will also discuss with her sons.   I called and spoke with son, Cheryl Vargas. He has just spoken with CSW. They are working on placement. Cheryl Vargas also expresses feeling overwhelmed and confused with  decisions. He had many questions about palliative care vs hospice and inpatient palliative team vs outpatient palliative team. He also expresses concern over DNR status and I did explain that I reviewed this with his mother just now and she confirmed desire for DNR status. Given all the questions and the feeling of being overwhelmed I requested that we schedule a time to meet together with myself, patient, and 2 sons to clarify goals and decisions. Cheryl Vargas agrees this could be helpful. Will await to hear from family for meeting time.   All questions/concerns addressed to the best of my ability. Emotional support provided.   Exam: Alert, oriented although overwhelmed with all the information she has received over the past few days. No distress. Fatigued. Breathing regular, unlabored. Abd flat. Moves all extremities.   Plan: - Ongoing discussions with hopes of meeting together with patient and sons.  - Plan for SNF rehab with milrinone for now per patient wishes.   60 min   , NP Palliative Medicine Team Pager [phone] (Please see [email or web address] for schedule) Team Phone [phone]    Greater than 50%  of this time was spent counseling and coordinating care related to the above assessment and plan  

## 2020-08-10 NOTE — Progress Notes (Addendum)
Patient ID: Cheryl Vargas, female   DOB: 1934-10-17, 85 y.o.   MRN: [id number]       Advanced Heart Failure Rounding Note  PCP-Cardiologist: [name], MD   Subjective:    Had localized pulmonary artery perforation during cath on 11/24. Intubated and underwent successful coil embolization with IR. Had VT x 1 during cath and shocked.  Extubated 11/25. Slight blood-tinged sputum.   Remains on milrinone 0.25 mcg , CO-OX 58%  Remains on midodrine 10 tid. Hgb stable at 11.4   Drowsy this morning. Denies SOB. Son asking questions about dispostion.   - ECHO EF 20-25% RV normal, biatrial enlargement, IVC normal.   - RHC 11/24  Procedural Findings (incomplete study due to emergency): Hemodynamics (mmHg) RA mean 6 PA 41/16, mean 26 PCWP mean 19 Oxygen saturations: PA 57% AO 93% Cardiac Output (Fick) 2.92  Cardiac Index (Fick) 1.8    Objective:   Weight Range: 61.9 kg Body mass index is 22.71 kg/m.   Vital Signs:   Temp:  [97.4 F (36.3 C)-98.8 F (37.1 C)] 98.5 F (36.9 C) (11/30 0432) Pulse Rate:  [80-82] 80 (11/30 0432) Resp:  [13-20] 20 (11/30 0432) BP: (88-115)/(46-89) 102/57 (11/30 0432) SpO2:  [91 %-96 %] 94 % (11/30 0432) Weight:  [61.9 kg] 61.9 kg (11/30 0200) Last BM Date: 07/30/20  Weight change: Filed Weights   08/08/20 0435 08/09/20 0035 08/10/20 0200  Weight: 59.7 kg 60 kg 61.9 kg    Intake/Output:   Intake/Output Summary (Last 24 hours) at 08/10/2020 0709 Last data filed at 08/10/2020 0434 Gross per 24 hour  Intake 491.04 ml  Output 800 ml  Net -308.96 ml      Physical Exam   CVP 3 General: Appears weak.  No resp difficulty HEENT: normal Neck: supple. no JVD. Carotids 2+ bilat; no bruits. No lymphadenopathy or thryomegaly appreciated. RIJ  Cor: PMI nondisplaced. Regular rate & rhythm. No rubs, gallops or murmurs. Lungs: clear Abdomen: soft, nontender, nondistended. No hepatosplenomegaly. No bruits or masses. Good bowel  sounds. Extremities: no cyanosis, clubbing, rash, edema Neuro: alert & orientedx3, cranial nerves grossly intact. moves all 4 extremities w/o difficulty. Affect pleasant   Telemetry   BiV paced 80s   Labs    CBC Recent Labs    08/09/20 0543 08/10/20 0500  WBC 9.2 6.8  HGB 12.3 11.4*  HCT 36.1 33.4*  MCV 90.3 89.8  PLT 173 782   Basic Metabolic Panel Recent Labs    08/09/20 0543 08/10/20 0500  NA 133* 130*  K 3.3* 3.4*  CL 97* 99  CO2 25 25  GLUCOSE 98 89  BUN 22 17  CREATININE 1.17* 1.09*  CALCIUM 7.8* 7.5*   Liver Function Tests No results for input(s): AST, ALT, ALKPHOS, BILITOT, PROT, ALBUMIN in the last 72 hours. No results for input(s): LIPASE, AMYLASE in the last 72 hours. Cardiac Enzymes No results for input(s): CKTOTAL, CKMB, CKMBINDEX, TROPONINI in the last 72 hours.  BNP: BNP (last 3 results) Recent Labs    09/27/19 1708 08/02/20 0832  BNP 1,032.7* 1,285.0*    ProBNP (last 3 results) No results for input(s): PROBNP in the last 8760 hours.   D-Dimer No results for input(s): DDIMER in the last 72 hours. Hemoglobin A1C No results for input(s): HGBA1C in the last 72 hours. Fasting Lipid Panel No results for input(s): CHOL, HDL, LDLCALC, TRIG, CHOLHDL, LDLDIRECT in the last 72 hours. Thyroid Function Tests No results for input(s): TSH, T4TOTAL, T3FREE, THYROIDAB in the  last 72 hours.  Invalid input(s): FREET3  Other results:   Imaging    No results found.   Medications:     Scheduled Medications: . amiodarone  200 mg Oral Daily  . Chlorhexidine Gluconate Cloth  6 each Topical Daily  . docusate sodium  100 mg Oral BID  . furosemide  40 mg Oral Daily  . melatonin  6 mg Oral QHS  . midodrine  10 mg Oral TID WC  . polyethylene glycol  17 g Oral Daily  . rivaroxaban  15 mg Oral Q supper    Infusions: . milrinone 0.25 mcg/kg/min (08/10/20 0548)    PRN Medications: acetaminophen, fentaNYL, LORazepam, phenol, sodium chloride  flush, zolpidem    Patient Profile   Cheryl Vargas is an 85 year old with a history ofHTN, HLD, PAF (On Xarelto), and nonischemic cardiomyopathy with chronic systolic CHF. Cardiomyopathy dates back to at least 2014. Most recent ECHO 09/2019 showed EF < 20%.  Intolerant ranolazine, losartan,spironolactone, amiodarone, sotolol, and tikosyn.  Admitted with presyncope/volume overload.   Assessment/Plan   1. Hemoptysis: Due to small, isolated right PA branch rupture on 11/24. She was intubated and underwent bronchoscopy with clearing of blood. Suspect bleeding contained quickly. S/p coiling PA branch in IR 11/24.  Hgb stable 12.  Scant blood-tinged sputum.  - Heparin stopped 11/28.  - Continue xarelto .  2. Acute on chronic systolic HF: [place] 11/3823 showed no significant coronary disease. Cardiac MRI 10/17 EF 36%, diffuse HK, normal RV, biatrial enlargement, moderate MR, non-coronary LGE pattern involving the mid-wall of the basal to mid septum and inferior wall. Possibly prior myocarditis versus a form of infiltrative disease. Medtronic CRT-D s/p AV nodal ablation. Echo in 1/21 showed EF <20% with moderate LV dilation and mildly decreased RV systolic function. Low output on 1/21 RHC (CI 1.72). With age and frailty, LVAD not thought to be good option. At last appointment, she was not BiV pacing at an ideal percentage, likely due to PVCs. Symptomatically worse, NYHA class IIIb since decreasing Lasix in setting of rising creatinine. Low output HF noted this admission as well, CI on RHC was 1.8.  She was on milrinone 0.125 prior to [place].  EP has seen, CRT device adjusted and QRS now narrower.  Echo this admission with EF 20-25%, RV low normal systolic function, severe MR and TR.  She diuresed well this admission, PCWP 19 on RHC.  As above, was intubated with pulmonary hemorrhage post-RHC, now extubated and stable.  Off NE and milrinone currently.  CO-OX dropped to 41% so milrinone 0.25 mcg  restarted. CO-OX 58%  - CVP 3-4. Volume status stable. .   - Continue midodrine 10 mg tid.  -  If goes home on milrinone and chooses hospice, will need [facility] most likely. - Continue po Lasix 40 mg daily.  -Hold bisoprolol as retrying amiodarone as below.  -Off digoxin with persistently elevated level despite cutting back to 0.0625 mg qod. - Concerned that frequent PVCs may be affecting function (and also limiting BiV pacing percentage) => Retrying her on amiodarone. Tolerating so far.  - She does not qualify for [facility] given age 23. Atrial fibrillation: Chronic, s/p AV nodal ablation and BiV pacing.  -Continue xarelto daily.   4. PVCs/VT: Frequent PVCs have limited BiV pacing. Not a good PVC ablation candidate per EP. Had VT in 1/21. Cannot tolerate amiodarone, sotolol, or Tikosyn. Did not tolerate mexilitine with nausea. Had nausea also with ranolazine. Additionally, did not tolerate further uptitration of  bisoprolol.  VT during pulmonary hemorrhage episode in setting of low oxygen saturation.  - Continue po amiodarone.  5. Mitral regurgitation: Per Dr Aundra Dubin --> At least 3+ MR, suspect functional due to dilated annulus with severely dilated LA.  TR appears worse (clearly severe).  Discussed with Dr. Burt Knack, do not think that she would be a good Mitraclip candidate as TR is clearly worse than MR.  6. AKI on CKD stage 3: Creatinine 1.17 today, stable.  - Daily BMET 7. Acute hypoxemic respiratory failure: Intubated in setting of hemoptysis. CCM following.   - extubated 11/25 - Stable today on 4 liters [place]>  8. Hypokalemia - Increase K 40 meq twice a day.  9. Debility - PT/OT consult => recommend SNF. Family trying to decide disposition.  10. [place] following. DNR   Lengthy discussion with her/son regarding disposition. Dsicussed HH versus Hospice versus SNF.  Still needs to decide if she wants home milrinone. If she does want milrinone she will need tunneled PICC for  d/c. If she does not want to continue milrinone would wean off over the next 48 hours. I thinks she needs Hospice support no matter where she discharges. Explained that milrinone is for quality of life and will need extend life.   If she want to go to Rehab will need COVID test.   [name] Clegg NP-C  08/10/2020 7:09 AM  Patient seen with NP, agree with the above note.   She is stable on milrinone.  Did not walk much yesterday, just around room.  Weak.  Co-ox 58%.  Creatinine stable 1.09 and hgb stable.  CVP 3-4, now on po Lasix.   General: NAD Neck: No JVD, no thyromegaly or thyroid nodule.  Lungs: Clear to auscultation bilaterally with normal respiratory effort. CV: Lateral PMI.  Heart regular S1/S2, no S3/S4, 2/6 HSM LLSB.  No peripheral edema.   Abdomen: Soft, nontender, no hepatosplenomegaly, no distention.  Skin: Intact without lesions or rashes.  Neurologic: Alert and oriented x 3.  Psych: Normal affect. Extremities: No clubbing or cyanosis.  HEENT: Normal.   No further hemoptysis, back on Xarelto.   Continue Lasix 40 mg daily, volume status and creatinine stable.   BP stable on midodrine, continue.   Co-ox 58% on milrinone 0.25.  Feels better on milrinone. Plan for home with palliative milrinone infusion.   - Suspect best plan here will be to rehab on milrinone to improve her independence/mobility, then home on hospice.  Hopefully [facility] hospice will allow palliative milrinone. Alternative would likely be off milrinone and go directly to hospice facility [facility]).  Social work involved.  Palliative care is following. Will tentatively plan tunneled catheter tomorrow for home milrinone, will need to hold Xarelto for procedure.    [name] 08/10/2020 2:42 PM

## 2020-08-10 NOTE — TOC Progression Note (Addendum)
Transition of Care [facility]) - Progression Note    Patient Details  Name: Cheryl Vargas MRN: [id number] Date of Birth: 1935/05/07  Transition of Care [facility]) CM/SW [place], [place] Phone Number: 08/10/2020, 10:12 AM  Clinical Narrative:    Update: CSW started auth with [facility]. Spoke with pt's son Cheryl Vargas, still interested in [facility]. Pt's son Cheryl Vargas interested in [place].  CSW spoke with pt's son Cheryl Vargas at length about SNF with Hospice vs SNF with short term rehab. Answered Cheryl Vargas's questions, leaning more towards SNF with short term rehab. Cheryl Vargas has questions for palliative PA/RN, will notify palliative team.    Expected Discharge Plan: [place] Barriers to Discharge: No Barriers Identified  Expected Discharge Plan and Services Expected Discharge Plan: [place] In-house Referral: Clinical Social Work   Post Acute Care Choice: [place] Living arrangements for the past 2 months: [place]                                       Social Determinants of Health (SDOH) Interventions    Readmission Risk Interventions No flowsheet data found.

## 2020-08-11 ENCOUNTER — Other Ambulatory Visit (HOSPITAL_COMMUNITY): Payer: PPO

## 2020-08-11 ENCOUNTER — Inpatient Hospital Stay (HOSPITAL_COMMUNITY): Payer: PPO

## 2020-08-11 DIAGNOSIS — I5043 Acute on chronic combined systolic (congestive) and diastolic (congestive) heart failure: Secondary | ICD-10-CM | POA: Diagnosis not present

## 2020-08-11 DIAGNOSIS — Z515 Encounter for palliative care: Secondary | ICD-10-CM | POA: Diagnosis not present

## 2020-08-11 DIAGNOSIS — Z7189 Other specified counseling: Secondary | ICD-10-CM | POA: Diagnosis not present

## 2020-08-11 DIAGNOSIS — I5023 Acute on chronic systolic (congestive) heart failure: Secondary | ICD-10-CM | POA: Diagnosis not present

## 2020-08-11 HISTORY — PX: IR FLUORO GUIDE CV LINE RIGHT: IMG2283

## 2020-08-11 HISTORY — PX: IR US GUIDE VASC ACCESS RIGHT: IMG2390

## 2020-08-11 LAB — CBC
HCT: 34.5 % — ABNORMAL LOW (ref 36.0–46.0)
Hemoglobin: 11.8 g/dL — ABNORMAL LOW (ref 12.0–15.0)
MCH: 30.8 pg (ref 26.0–34.0)
MCHC: 34.2 g/dL (ref 30.0–36.0)
MCV: 90.1 fL (ref 80.0–100.0)
Platelets: 192 10*3/uL (ref 150–400)
RBC: 3.83 MIL/uL — ABNORMAL LOW (ref 3.87–5.11)
RDW: 13.3 % (ref 11.5–15.5)
WBC: 7.1 10*3/uL (ref 4.0–10.5)
nRBC: 0 % (ref 0.0–0.2)

## 2020-08-11 LAB — BASIC METABOLIC PANEL
Anion gap: 10 (ref 5–15)
BUN: 16 mg/dL (ref 8–23)
CO2: 22 mmol/L (ref 22–32)
Calcium: 8.2 mg/dL — ABNORMAL LOW (ref 8.9–10.3)
Chloride: 102 mmol/L (ref 98–111)
Creatinine, Ser: 1 mg/dL (ref 0.44–1.00)
GFR, Estimated: 56 mL/min — ABNORMAL LOW (ref >60)
Glucose, Bld: 95 mg/dL (ref 70–99)
Potassium: 4.6 mmol/L (ref 3.5–5.1)
Sodium: 134 mmol/L — ABNORMAL LOW (ref 135–145)

## 2020-08-11 LAB — COOXEMETRY PANEL
Carboxyhemoglobin: 1.4 % (ref 0.5–1.5)
Methemoglobin: 0.8 % (ref 0.0–1.5)
O2 Saturation: 65.6 %
Total hemoglobin: 12.4 g/dL (ref 12.0–16.0)

## 2020-08-11 MED ORDER — CHLORHEXIDINE GLUCONATE 4 % EX LIQD
CUTANEOUS | Status: AC
  Filled 2020-08-11: qty 15

## 2020-08-11 MED ORDER — FUROSEMIDE 20 MG PO TABS
20.0000 mg | ORAL_TABLET | Freq: Every day | ORAL | Status: DC
  Administered 2020-08-13 – 2020-08-17 (×5): 20 mg via ORAL
  Filled 2020-08-11 (×5): qty 1

## 2020-08-11 MED ORDER — LIDOCAINE-EPINEPHRINE 1 %-1:100000 IJ SOLN
INTRAMUSCULAR | Status: AC
  Filled 2020-08-11: qty 1

## 2020-08-11 MED ORDER — LIDOCAINE-EPINEPHRINE 2 %-1:100000 IJ SOLN
INTRAMUSCULAR | Status: DC | PRN
  Administered 2020-08-11: 10 mL

## 2020-08-11 MED ORDER — FUROSEMIDE 20 MG PO TABS
20.0000 mg | ORAL_TABLET | Freq: Every day | ORAL | Status: DC
Start: 2020-08-12 — End: 2020-08-11

## 2020-08-11 NOTE — Progress Notes (Signed)
Palliative:  HPI: 85 y.o.femalewith past medical history of NICM, systolic CHF EF <97%, HTN, HLD, afib (on Xarelto), s/p Medtronic CRT-D, CKD stage 3admitted on 11/22/2021with increased shortness of breath related to CHF exacerbation and worsening renal function.Hospitalization complicated by hemoptysis and has required intubation. Also requiring milrinone infusion. MOST form in records completed by outpatient palliative care for following: DNR, limited interventions (would consider ICU admission but no desire for intubation), ok with antibiotics, ok with tube feedings depending on long term prognosis.   I met today with Cheryl Vargas. She is having some discomfort from PICC placement so that she can continue milrinone. She talks of going to [facility] SNF for rehab and hopeful to return to her home after rehab stay. I asked her if she had any concerns and she tells me that she misses her cats (she has 2 at home) and worried about how she will get her mail. We discussed that her children will assist and make sure these issues are covered for her. Hopeful to return home to her cats in the near future. She agrees to continuing milrinone infusion outpatient. Daughter, [name], coming to bedside and confirms she is looking after Ms. Janella cats and can help her with her mail.   I called and spoke with [name] and he confirms plan for SNF rehab and hopes to return home. They are hopeful she will do well but realistic that she has advanced heart failure and has limitation. They are open to hospice support at home after rehab. We did discuss that Ms. Frannie is medically ready when SNF bed found for discharge as far as I understand. [name] understands and hopeful they will be able to confirm SNF choice by the end of the day. He does ask about how milrinone works in SNF and I will have to talk with CSW further about how this is managed in facility.   **I discussed with CSW Cherish and sounds like there was some confusion  regarding facilities and plans for milrinone. Cherish plans to discuss further with family.   All questions/concerns addressed to the best of my ability. Emotional support provided.   Exam: Alert, more oriented and less foggy today. No distress. Fatigued. Breathing regular, unlabored. Abd flat.   Plan: - SNF rehab with milrinone infusion.  - Likely hospice support at home following rehab stay (hopefully she can improve enough to return to her home).   25 min  Vinie Sill, NP Palliative Medicine Team Pager [phone] (Please see [email or web address] for schedule) Team Phone [phone]    Greater than 50%  of this time was spent counseling and coordinating care related to the above assessment and plan

## 2020-08-11 NOTE — TOC Progression Note (Signed)
Transition of Care [facility]) - Progression Note    Patient Details  Name: Cheryl Vargas MRN: [id number] Date of Birth: 11/01/1934  Transition of Care [facility]) CM/SW [place], [place] Phone Number: 08/11/2020, 9:34 AM  Clinical Narrative:    CSW reached out to pt's son [name] in reference to [employer]. CSW relayed message to [name] that [facility] has offered a bed for pt for short term rehab, [name] states that he is not sure he wants bed at [facility] now and that he has reached out to a [facility] liaison for a list of SNFs. CSW advised [name] that offers were sent out to all local SNF's but there are many factors that determine if pt will be accepted. [name] states that since he is aware of how this process works, he is only trying to find the best place for pt. CSW expressed understanding but reminded [name] that not all SNFs are the same. [name] states he is under the impression that pt will be here at least through the weekend. CSW sent message to MD to try and narrow down a dc date as insurance auth process has started. [name] states that while no decision has been made, the family will keep CSW up to date on the final decision. CSW will reach back out for a decision once MD provides clearer dc date.    Expected Discharge Plan: [place] Barriers to Discharge: No Barriers Identified  Expected Discharge Plan and Services Expected Discharge Plan: [place] In-house Referral: Clinical Social Work   Post Acute Care Choice: [place] Living arrangements for the past 2 months: [place]                                       Social Determinants of Health (SDOH) Interventions    Readmission Risk Interventions No flowsheet data found.

## 2020-08-11 NOTE — TOC Progression Note (Addendum)
Transition of Care [facility]) - Progression Note    Patient Details  Name: Cheryl Vargas MRN: [id number] Date of Birth: 27-Nov-1934  Transition of Care [facility]) CM/SW [place], [place] Phone Number: 08/11/2020, 2:51 PM  Clinical Narrative:    CSW met with pt at bedside to discuss dc plans, adv pt that we are still working to secure placement. Pt thanked CSW for her time. CSW will continue to be available for pt dc needs.   Pt will be going to SNF on IV Milrinone, currently only two SNFs can accomodate: [facility] and [facility]. CSW reached out to [facility], can administer and manage but not sure of availability, will let CSW know. CSW reached out to [facility], admissions coordinator not back in the office until 08/12/20, left vm.    Expected Discharge Plan: [place] Barriers to Discharge: No Barriers Identified  Expected Discharge Plan and Services Expected Discharge Plan: [place] In-house Referral: Clinical Social Work   Post Acute Care Choice: [place] Living arrangements for the past 2 months: [place]                                       Social Determinants of Health (SDOH) Interventions    Readmission Risk Interventions No flowsheet data found.

## 2020-08-11 NOTE — Plan of Care (Signed)
  Problem: Education: Goal: Knowledge of General Education information will improve Description: Including pain rating scale, medication(s)/side effects and non-pharmacologic comfort measures Outcome: Progressing   Problem: Health Behavior/Discharge Planning: Goal: Ability to manage health-related needs will improve Outcome: Progressing   Problem: Clinical Measurements: Goal: Ability to maintain clinical measurements within normal limits will improve Outcome: Progressing Goal: Will remain free from infection Outcome: Progressing Goal: Diagnostic test results will improve Outcome: Progressing Goal: Respiratory complications will improve Outcome: Progressing Goal: Cardiovascular complication will be avoided Outcome: Progressing   Problem: Activity: Goal: Risk for activity intolerance will decrease Outcome: Progressing   Problem: Nutrition: Goal: Adequate nutrition will be maintained Outcome: Progressing   Problem: Coping: Goal: Level of anxiety will decrease Outcome: Progressing   Problem: Elimination: Goal: Will not experience complications related to bowel motility Outcome: Progressing Goal: Will not experience complications related to urinary retention Outcome: Progressing   Problem: Pain Managment: Goal: General experience of comfort will improve Outcome: Progressing   Problem: Safety: Goal: Ability to remain free from injury will improve Outcome: Progressing   Problem: Skin Integrity: Goal: Risk for impaired skin integrity will decrease Outcome: Progressing   Problem: Education: Goal: Understanding of CV disease, CV risk reduction, and recovery process will improve Outcome: Progressing   Problem: Activity: Goal: Ability to return to baseline activity level will improve Outcome: Progressing   Problem: Cardiovascular: Goal: Ability to achieve and maintain adequate cardiovascular perfusion will improve Outcome: Progressing Goal: Vascular access site(s)  Level 0-1 will be maintained Outcome: Progressing   Problem: Health Behavior/Discharge Planning: Goal: Ability to safely manage health-related needs after discharge will improve Outcome: Progressing   Problem: Education: Goal: Knowledge of General Education information will improve Description: Including pain rating scale, medication(s)/side effects and non-pharmacologic comfort measures Outcome: Progressing   Problem: Health Behavior/Discharge Planning: Goal: Ability to manage health-related needs will improve Outcome: Progressing   Problem: Clinical Measurements: Goal: Ability to maintain clinical measurements within normal limits will improve Outcome: Progressing Goal: Will remain free from infection Outcome: Progressing Goal: Diagnostic test results will improve Outcome: Progressing Goal: Respiratory complications will improve Outcome: Progressing Goal: Cardiovascular complication will be avoided Outcome: Progressing   Problem: Activity: Goal: Risk for activity intolerance will decrease Outcome: Progressing   Problem: Nutrition: Goal: Adequate nutrition will be maintained Outcome: Progressing   Problem: Coping: Goal: Level of anxiety will decrease Outcome: Progressing   Problem: Elimination: Goal: Will not experience complications related to bowel motility Outcome: Progressing Goal: Will not experience complications related to urinary retention Outcome: Progressing   Problem: Pain Managment: Goal: General experience of comfort will improve Outcome: Progressing   Problem: Safety: Goal: Ability to remain free from injury will improve Outcome: Progressing   Problem: Skin Integrity: Goal: Risk for impaired skin integrity will decrease Outcome: Progressing   Problem: Education: Goal: Understanding of CV disease, CV risk reduction, and recovery process will improve Outcome: Progressing Goal: Individualized Educational Video(s) Outcome: Progressing    Problem: Activity: Goal: Ability to return to baseline activity level will improve Outcome: Progressing   Problem: Cardiovascular: Goal: Ability to achieve and maintain adequate cardiovascular perfusion will improve Outcome: Progressing Goal: Vascular access site(s) Level 0-1 will be maintained Outcome: Progressing   Problem: Health Behavior/Discharge Planning: Goal: Ability to safely manage health-related needs after discharge will improve Outcome: Progressing

## 2020-08-11 NOTE — Progress Notes (Signed)
OT Cancellation Note  Patient Details Name: Cheryl Vargas MRN: [id number] DOB: [birth year]   Cancelled Treatment:    Reason Eval/Treat Not Completed: Fatigue/lethargy limiting ability to participate. Pt fatigued after procedure this morning and having choked on on her spaghetti. Will continue to follow.  Cheryl Vargas 08/11/2020, 3:25 PM  Nestor Lewandowsky, OTR/L Acute Rehabilitation Services Pager: [phone] Office: [phone]

## 2020-08-11 NOTE — Progress Notes (Signed)
Patient ID: Cheryl Vargas, female   DOB: [birth year], 85 y.o.   MRN: [id number]       Advanced Heart Failure Rounding Note  PCP-Cardiologist: [name], MD   Subjective:    Had localized pulmonary artery perforation during cath on 11/24. Intubated and underwent successful coil embolization with IR. Had VT x 1 during cath and shocked.  Extubated 11/25.  No further hemoptysis.   Remains on milrinone 0.25 mcg , CO-OX 65%  Remains on midodrine 10 tid. Hgb stable at 11.8.  Tunneled catheter placed this morning.  CVP 2-3.   - ECHO EF 20-25% RV normal, biatrial enlargement, IVC normal.   - RHC 11/24  Procedural Findings (incomplete study due to emergency): Hemodynamics (mmHg) RA mean 6 PA 41/16, mean 26 PCWP mean 19 Oxygen saturations: PA 57% AO 93% Cardiac Output (Fick) 2.92  Cardiac Index (Fick) 1.8    Objective:   Weight Range: 60.5 kg Body mass index is 22.2 kg/m.   Vital Signs:   Temp:  [97.6 F (36.4 C)-98.6 F (37 C)] 98.2 F (36.8 C) (12/01 0500) Pulse Rate:  [79-93] 93 (12/01 0500) Resp:  [15-22] 15 (12/01 0500) BP: (103-111)/(54-62) 111/62 (12/01 0500) SpO2:  [95 %-98 %] 97 % (12/01 0500) Weight:  [60.5 kg] 60.5 kg (12/01 0500) Last BM Date: 08/10/20  Weight change: Filed Weights   08/09/20 0035 08/10/20 0200 08/11/20 0500  Weight: 60 kg 61.9 kg 60.5 kg    Intake/Output:   Intake/Output Summary (Last 24 hours) at 08/11/2020 1123 Last data filed at 08/11/2020 0558 Gross per 24 hour  Intake 240 ml  Output 500 ml  Net -260 ml      Physical Exam   CVP 2-3 General: NAD, frail Neck: No JVD, no thyromegaly or thyroid nodule.  Lungs: Clear to auscultation bilaterally with normal respiratory effort. CV: Lateral PMI.  Heart regular S1/S2, no S3/S4, 2/6 HSM LLSB.  No peripheral edema.   Abdomen: Soft, nontender, no hepatosplenomegaly, no distention.  Skin: Intact without lesions or rashes.  Neurologic: Alert and oriented x 3.  Psych: Normal  affect. Extremities: No clubbing or cyanosis.  HEENT: Normal.    Telemetry   BiV paced 80s, underlying atrial fibrillation.  Personally reviewed.   Labs    CBC Recent Labs    08/10/20 0500 08/11/20 0500  WBC 6.8 7.1  HGB 11.4* 11.8*  HCT 33.4* 34.5*  MCV 89.8 90.1  PLT 159 948   Basic Metabolic Panel Recent Labs    08/10/20 0500 08/11/20 0500  NA 130* 134*  K 3.4* 4.6  CL 99 102  CO2 25 22  GLUCOSE 89 95  BUN 17 16  CREATININE 1.09* 1.00  CALCIUM 7.5* 8.2*   Liver Function Tests No results for input(s): AST, ALT, ALKPHOS, BILITOT, PROT, ALBUMIN in the last 72 hours. No results for input(s): LIPASE, AMYLASE in the last 72 hours. Cardiac Enzymes No results for input(s): CKTOTAL, CKMB, CKMBINDEX, TROPONINI in the last 72 hours.  BNP: BNP (last 3 results) Recent Labs    09/27/19 1708 08/02/20 0832  BNP 1,032.7* 1,285.0*    ProBNP (last 3 results) No results for input(s): PROBNP in the last 8760 hours.   D-Dimer No results for input(s): DDIMER in the last 72 hours. Hemoglobin A1C No results for input(s): HGBA1C in the last 72 hours. Fasting Lipid Panel No results for input(s): CHOL, HDL, LDLCALC, TRIG, CHOLHDL, LDLDIRECT in the last 72 hours. Thyroid Function Tests No results for input(s): TSH, T4TOTAL, T3FREE,  THYROIDAB in the last 72 hours.  Invalid input(s): FREET3  Other results:   Imaging    IR Fluoro Guide CV Line Right  Result Date: 08/11/2020 INDICATION: 85 year old with acute on chronic systolic heart failure. Patient needs a tunneled central line for Milrinone therapy. EXAM: FLUOROSCOPIC AND ULTRASOUND GUIDED PLACEMENT OF A TUNNELED CENTRAL VENOUS CATHETER Physician: Stephan Minister. Henn, MD FLUOROSCOPY TIME:  1 minute, 42 seconds, 11 mGy MEDICATIONS: None ANESTHESIA/SEDATION: None PROCEDURE: Informed consent was obtained for placement of a tunneled central venous catheter. The patient was placed supine on the interventional table. Ultrasound  confirmed a patent right internal jugularvein. Ultrasound images were obtained for documentation. The right neck and chest was prepped and draped in a sterile fashion. The right neck was anesthetized with 1% lidocaine. Maximal barrier sterile technique was utilized including caps, mask, sterile gowns, sterile gloves, sterile drape, hand hygiene and skin antiseptic. A small incision was made with #11 blade scalpel. A 21 gauge needle directed into the right internal jugular vein with ultrasound guidance. A micropuncture dilator set was placed. A dual lumen Powerline catheter was selected. The skin below the right clavicle was anesthetized and a small incision was made with an #11 blade scalpel. A subcutaneous tunnel was formed to the vein dermatotomy site. The catheter was brought through the tunnel. The vein dermatotomy site was dilated to accommodate a peel-away sheath. The catheter was placed through the peel-away sheath and directed into the central venous structures. The tip of the catheter was placed at the superior cavoatrial junction with fluoroscopy. Fluoroscopic images were obtained for documentation. Both lumens were found to aspirate and flush well. Both lumens were flushed with normal saline. The vein dermatotomy site was closed using Dermabond. The catheter was secured to the skin using Prolene suture. FINDINGS: Catheter tip at the superior cavoatrial junction. COMPLICATIONS: None IMPRESSION: Successful placement of a right jugular tunneled central venous catheter using ultrasound and fluoroscopic guidance. Electronically Signed   By: Markus Daft M.D.   On: 08/11/2020 10:48   IR US Guide Vasc Access Right  Result Date: 08/11/2020 INDICATION: 85 year old with acute on chronic systolic heart failure. Patient needs a tunneled central line for Milrinone therapy. EXAM: FLUOROSCOPIC AND ULTRASOUND GUIDED PLACEMENT OF A TUNNELED CENTRAL VENOUS CATHETER Physician: Stephan Minister. Henn, MD FLUOROSCOPY TIME:  1 minute,  42 seconds, 11 mGy MEDICATIONS: None ANESTHESIA/SEDATION: None PROCEDURE: Informed consent was obtained for placement of a tunneled central venous catheter. The patient was placed supine on the interventional table. Ultrasound confirmed a patent right internal jugularvein. Ultrasound images were obtained for documentation. The right neck and chest was prepped and draped in a sterile fashion. The right neck was anesthetized with 1% lidocaine. Maximal barrier sterile technique was utilized including caps, mask, sterile gowns, sterile gloves, sterile drape, hand hygiene and skin antiseptic. A small incision was made with #11 blade scalpel. A 21 gauge needle directed into the right internal jugular vein with ultrasound guidance. A micropuncture dilator set was placed. A dual lumen Powerline catheter was selected. The skin below the right clavicle was anesthetized and a small incision was made with an #11 blade scalpel. A subcutaneous tunnel was formed to the vein dermatotomy site. The catheter was brought through the tunnel. The vein dermatotomy site was dilated to accommodate a peel-away sheath. The catheter was placed through the peel-away sheath and directed into the central venous structures. The tip of the catheter was placed at the superior cavoatrial junction with fluoroscopy. Fluoroscopic images were obtained for  documentation. Both lumens were found to aspirate and flush well. Both lumens were flushed with normal saline. The vein dermatotomy site was closed using Dermabond. The catheter was secured to the skin using Prolene suture. FINDINGS: Catheter tip at the superior cavoatrial junction. COMPLICATIONS: None IMPRESSION: Successful placement of a right jugular tunneled central venous catheter using ultrasound and fluoroscopic guidance. Electronically Signed   By: Markus Daft M.D.   On: 08/11/2020 10:48     Medications:     Scheduled Medications: . amiodarone  200 mg Oral Daily  . chlorhexidine      .  Chlorhexidine Gluconate Cloth  6 each Topical Daily  . docusate sodium  100 mg Oral BID  . [START ON 08/13/2020] furosemide  20 mg Oral Daily  . lidocaine-EPINEPHrine      . melatonin  6 mg Oral QHS  . midodrine  10 mg Oral TID WC  . polyethylene glycol  17 g Oral Daily  . potassium chloride  40 mEq Oral BID  . rivaroxaban  15 mg Oral Q supper    Infusions: . milrinone 0.25 mcg/kg/min (08/11/20 0509)    PRN Medications: acetaminophen, fentaNYL, lidocaine-EPINEPHrine, LORazepam, phenol, sodium chloride flush, zolpidem    Patient Profile   Cheryl Vargas is an 85 year old with a history ofHTN, HLD, PAF (On Xarelto), and nonischemic cardiomyopathy with chronic systolic CHF. Cardiomyopathy dates back to at least 2014. Most recent ECHO 09/2019 showed EF < 20%.  Intolerant ranolazine, losartan,spironolactone, amiodarone, sotolol, and tikosyn.  Admitted with presyncope/volume overload.   Assessment/Plan   1. Hemoptysis: Due to small, isolated right PA branch rupture on 11/24. She was intubated and underwent bronchoscopy with clearing of blood. Suspect bleeding contained quickly. S/p coiling PA branch in IR 11/24.  Hgb stable.  Hemoptysis resolved.  - Back on Xarelto.  2. Acute on chronic systolic HF: [place] 11/2353 showed no significant coronary disease. Cardiac MRI 10/17 EF 36%, diffuse HK, normal RV, biatrial enlargement, moderate MR, non-coronary LGE pattern involving the mid-wall of the basal to mid septum and inferior wall. Possibly prior myocarditis versus a form of infiltrative disease. Medtronic CRT-D s/p AV nodal ablation. Echo in 1/21 showed EF <20% with moderate LV dilation and mildly decreased RV systolic function. Low output on 1/21 RHC (CI 1.72). With age and frailty, LVAD not thought to be good option. At last appointment, she was not BiV pacing at an ideal percentage, likely due to PVCs. Symptomatically worse, NYHA class IIIb since decreasing Lasix in setting of rising  creatinine. Low output HF noted this admission as well, CI on RHC was 1.8.  She was on milrinone 0.125 prior to [place].  EP has seen, CRT device adjusted and QRS now narrower.  Echo this admission with EF 20-25%, RV low normal systolic function, severe MR and TR.  She diuresed well this admission, PCWP 19 on RHC.  As above, was intubated with pulmonary hemorrhage post-RHC, now extubated and stable.  Off NE and on milrinone 0.25 currently.  She has end-stage CHF and is inotrope-dependent.  Co-ox 65% today with CVP 2-3.  - Suspect best plan here will be to rehab on milrinone to improve her independence/mobility, then home on hospice.  Hopefully [facility] hospice will allow palliative milrinone.  Social work involved.  Palliative care is following. She now has tunneled catheter for home milrinone.  - Decrease Lasix to 20 mg daily.  - Continue midodrine 10 mg tid.  -Off digoxin with persistently elevated level despite cutting back to 0.0625  mg qod. - Concerned that frequent PVCs may be affecting function (and also limiting BiV pacing percentage) => Retrying her on amiodarone. Tolerating so far.  3. Atrial fibrillation: Chronic, s/p AV nodal ablation and BiV pacing.  -Continue xarelto daily.   4. PVCs/VT: Frequent PVCs have limited BiV pacing. Not a good PVC ablation candidate per EP. Had VT in 1/21. Cannot tolerate amiodarone, sotolol, or Tikosyn. Did not tolerate mexilitine with nausea. Had nausea also with ranolazine. Additionally, did not tolerate further uptitration of bisoprolol.  VT during pulmonary hemorrhage episode in setting of low oxygen saturation.  - Continue po amiodarone.  5. Mitral regurgitation: Per Dr Aundra Dubin --> At least 3+ MR, suspect functional due to dilated annulus with severely dilated LA.  TR appears worse (clearly severe).  Discussed with Dr. Burt Knack, do not think that she would be a good Mitraclip candidate as TR is clearly worse than MR.  6. AKI on CKD stage 3: Creatinine 1 today,  stable.  - Daily BMET 7. Debility: PT/OT consult => recommend SNF. Family trying to decide disposition.   Family still trying to decide on rehab facility.  She is ready for discharge to facility when decision is made.  We have been discussing eventual hospice once she leaves rehab, would like to try to get her independent enough to go home with hospice (plan currently for palliative home milrinone so will need [facility] hospice).  Note palliative care plan for family meeting, think this will be helpful.   [name] 08/11/2020 11:23 AM

## 2020-08-11 NOTE — Progress Notes (Signed)
   08/11/20 2000  Assess: MEWS Score  Temp 98.2 F (36.8 C)  BP (!) 98/53  Pulse Rate 79  ECG Heart Rate 80  Resp (!) 25  Level of Consciousness Alert  SpO2 96 %  O2 Device Nasal Cannula  Assess: MEWS Score  MEWS Temp 0  MEWS Systolic 1  MEWS Pulse 0  MEWS RR 1  MEWS LOC 0  MEWS Score 2  MEWS Score Color Yellow  Assess: if the MEWS score is Yellow or Red  Were vital signs taken at a resting state? Yes  Focused Assessment No change from prior assessment  Early Detection of Sepsis Score *See Row Information* Low  MEWS guidelines implemented *See Row Information* Yes  Treat  Pain Scale 0-10  Pain Score 0  Take Vital Signs  Increase Vital Sign Frequency  Yellow: Q 2hr X 2 then Q 4hr X 2, if remains yellow, continue Q 4hrs  Escalate  MEWS: Escalate Yellow: discuss with charge nurse/RN and consider discussing with provider and RRT  Notify: Charge Nurse/RN  Name of Charge Nurse/RN Notified Danae Chen RN  Date Charge Nurse/RN Notified 08/11/20  Time Charge Nurse/RN Notified 2156  Notify: Provider  Provider Name/Title Dr. Vickki Muff  Date Provider Notified 08/11/20  Time Provider Notified 2156  Notification Type Page  Notification Reason Other (Comment) (yellow MEWS score)

## 2020-08-11 NOTE — Procedures (Signed)
Interventional Radiology Procedure:   Indications: Acute on chronic systolic heart failure  Procedure: Tunneled central line placement  Findings: Double lumen Powerline placed in right jugular vein, tip at SVC/RA junction  Complications: None     EBL: less than 5 ml  Plan: Central line is ready to use.    Darol Cush R. Anselm Pancoast, MD  Pager: [phone]

## 2020-08-11 NOTE — Progress Notes (Addendum)
PT Cancellation Note  Patient Details Name: Cheryl Vargas MRN: [id number] DOB: August 26, 1935   Cancelled Treatment:     pt just had central line placed and is having pain and wants to rest. Attempted again at 13:22, pt just chocked on food and was not feeling well, asked to come back tomorrow   Cheryl Vargas, DPT Acute Rehabilitation Services [phone]   [place] 08/11/2020, 10:25 AM

## 2020-08-12 DIAGNOSIS — I5043 Acute on chronic combined systolic (congestive) and diastolic (congestive) heart failure: Secondary | ICD-10-CM | POA: Diagnosis not present

## 2020-08-12 LAB — BASIC METABOLIC PANEL
Anion gap: 8 (ref 5–15)
BUN: 16 mg/dL (ref 8–23)
CO2: 23 mmol/L (ref 22–32)
Calcium: 8.2 mg/dL — ABNORMAL LOW (ref 8.9–10.3)
Chloride: 103 mmol/L (ref 98–111)
Creatinine, Ser: 1.14 mg/dL — ABNORMAL HIGH (ref 0.44–1.00)
GFR, Estimated: 48 mL/min — ABNORMAL LOW (ref >60)
Glucose, Bld: 92 mg/dL (ref 70–99)
Potassium: 4.9 mmol/L (ref 3.5–5.1)
Sodium: 134 mmol/L — ABNORMAL LOW (ref 135–145)

## 2020-08-12 LAB — COOXEMETRY PANEL
Carboxyhemoglobin: 1.6 % — ABNORMAL HIGH (ref 0.5–1.5)
Methemoglobin: 0.7 % (ref 0.0–1.5)
O2 Saturation: 92.2 %
Total hemoglobin: 12.5 g/dL (ref 12.0–16.0)

## 2020-08-12 NOTE — Progress Notes (Signed)
Patient ID: Cheryl Vargas, female   DOB: 02-12-1935, 85 y.o.   MRN: [id number]       Advanced Heart Failure Rounding Note  PCP-Cardiologist: [name], MD   Subjective:    Had localized pulmonary artery perforation during cath on 11/24. Intubated and underwent successful coil embolization with IR. Had VT x 1 during cath and shocked.  Extubated 11/25.  No further hemoptysis.   Remains on milrinone 0.25 mcg , CO-OX not accurate today.  Remains on midodrine 10 tid.  CVP 5.   Feels tired.  Sat at side of bed and stood up, did some bed exercises. Reports right hip pain.   - ECHO EF 20-25% RV normal, biatrial enlargement, IVC normal.   - RHC 11/24  Procedural Findings (incomplete study due to emergency): Hemodynamics (mmHg) RA mean 6 PA 41/16, mean 26 PCWP mean 19 Oxygen saturations: PA 57% AO 93% Cardiac Output (Fick) 2.92  Cardiac Index (Fick) 1.8    Objective:   Weight Range: 61.5 kg Body mass index is 22.56 kg/m.   Vital Signs:   Temp:  [97.9 F (36.6 C)-98.6 F (37 C)] 98.6 F (37 C) (12/02 1117) Pulse Rate:  [79-83] 81 (12/02 0418) Resp:  [18-25] 18 (12/02 0418) BP: (98-118)/(52-73) 107/59 (12/02 0418) SpO2:  [94 %-98 %] 96 % (12/02 0418) Weight:  [61.5 kg] 61.5 kg (12/02 0418) Last BM Date: 08/11/20  Weight change: Filed Weights   08/10/20 0200 08/11/20 0500 08/12/20 0418  Weight: 61.9 kg 60.5 kg 61.5 kg    Intake/Output:   Intake/Output Summary (Last 24 hours) at 08/12/2020 1445 Last data filed at 08/12/2020 1039 Gross per 24 hour  Intake 840 ml  Output 400 ml  Net 440 ml      Physical Exam   CVP 5 General: NAD, frail.  Neck: No JVD, no thyromegaly or thyroid nodule.  Lungs: Clear to auscultation bilaterally with normal respiratory effort. CV: Nondisplaced PMI.  Heart regular S1/S2, no S3/S4, 2/6 HSM apex.  No peripheral edema.   Abdomen: Soft, nontender, no hepatosplenomegaly, no distention.  Skin: Intact without lesions or rashes.   Neurologic: Alert and oriented x 3.  Psych: Normal affect. Extremities: No clubbing or cyanosis.  HEENT: Normal.    Telemetry   BiV paced 80s, underlying atrial fibrillation.  Personally reviewed.   Labs    CBC Recent Labs    08/10/20 0500 08/11/20 0500  WBC 6.8 7.1  HGB 11.4* 11.8*  HCT 33.4* 34.5*  MCV 89.8 90.1  PLT 159 427   Basic Metabolic Panel Recent Labs    08/11/20 0500 08/12/20 0450  NA 134* 134*  K 4.6 4.9  CL 102 103  CO2 22 23  GLUCOSE 95 92  BUN 16 16  CREATININE 1.00 1.14*  CALCIUM 8.2* 8.2*   Liver Function Tests No results for input(s): AST, ALT, ALKPHOS, BILITOT, PROT, ALBUMIN in the last 72 hours. No results for input(s): LIPASE, AMYLASE in the last 72 hours. Cardiac Enzymes No results for input(s): CKTOTAL, CKMB, CKMBINDEX, TROPONINI in the last 72 hours.  BNP: BNP (last 3 results) Recent Labs    09/27/19 1708 08/02/20 0832  BNP 1,032.7* 1,285.0*    ProBNP (last 3 results) No results for input(s): PROBNP in the last 8760 hours.   D-Dimer No results for input(s): DDIMER in the last 72 hours. Hemoglobin A1C No results for input(s): HGBA1C in the last 72 hours. Fasting Lipid Panel No results for input(s): CHOL, HDL, LDLCALC, TRIG, CHOLHDL, LDLDIRECT in  the last 72 hours. Thyroid Function Tests No results for input(s): TSH, T4TOTAL, T3FREE, THYROIDAB in the last 72 hours.  Invalid input(s): FREET3  Other results:   Imaging    No results found.   Medications:     Scheduled Medications: . amiodarone  200 mg Oral Daily  . Chlorhexidine Gluconate Cloth  6 each Topical Daily  . docusate sodium  100 mg Oral BID  . [START ON 08/13/2020] furosemide  20 mg Oral Daily  . melatonin  6 mg Oral QHS  . midodrine  10 mg Oral TID WC  . polyethylene glycol  17 g Oral Daily  . potassium chloride  40 mEq Oral BID  . rivaroxaban  15 mg Oral Q supper    Infusions: . milrinone 0.25 mcg/kg/min (08/12/20 0132)    PRN Medications:  acetaminophen, fentaNYL, lidocaine-EPINEPHrine, LORazepam, phenol, sodium chloride flush, zolpidem    Patient Profile   Cheryl Vargas is an 85 year old with a history ofHTN, HLD, PAF (On Xarelto), and nonischemic cardiomyopathy with chronic systolic CHF. Cardiomyopathy dates back to at least 2014. Most recent ECHO 09/2019 showed EF < 20%.  Intolerant ranolazine, losartan,spironolactone, amiodarone, sotolol, and tikosyn.  Admitted with presyncope/volume overload.   Assessment/Plan   1. Hemoptysis: Due to small, isolated right PA branch rupture on 11/24. She was intubated and underwent bronchoscopy with clearing of blood. Suspect bleeding contained quickly. S/p coiling PA branch in IR 11/24.  Hgb stable.  Hemoptysis resolved.  - Back on Xarelto.  2. Acute on chronic systolic HF: [place] 01/92 showed no significant coronary disease. Cardiac MRI 10/17 EF 36%, diffuse HK, normal RV, biatrial enlargement, moderate MR, non-coronary LGE pattern involving the mid-wall of the basal to mid septum and inferior wall. Possibly prior myocarditis versus a form of infiltrative disease. Medtronic CRT-D s/p AV nodal ablation. Echo in 1/21 showed EF <20% with moderate LV dilation and mildly decreased RV systolic function. Low output on 1/21 RHC (CI 1.72). With age and frailty, LVAD not thought to be good option. At last appointment, she was not BiV pacing at an ideal percentage, likely due to PVCs. Symptomatically worse, NYHA class IIIb since decreasing Lasix in setting of rising creatinine. Low output HF noted this admission as well, CI on RHC was 1.8.  She was on milrinone 0.125 prior to [place].  EP has seen, CRT device adjusted and QRS now narrower.  Echo this admission with EF 20-25%, RV low normal systolic function, severe MR and TR.  She diuresed well this admission, PCWP 19 on RHC.  As above, was intubated with pulmonary hemorrhage post-RHC, now extubated and stable.  Off NE and on milrinone 0.25 currently.   She has end-stage CHF and is inotrope-dependent.  Co-ox not accurate today, CVP 5.  - Suspect best plan here will be to rehab on milrinone to improve her independence/mobility, then home on hospice.  Hopefully [facility] hospice will allow palliative milrinone.  Social work involved.  Palliative care is following. She now has tunneled catheter for home milrinone.  - Continue Lasix 20 mg daily.  - Continue midodrine 10 mg tid.  -Off digoxin with persistently elevated level despite cutting back to 0.0625 mg qod. - Concerned that frequent PVCs may be affecting function (and also limiting BiV pacing percentage) => Retrying her on amiodarone. Tolerating so far.  3. Atrial fibrillation: Chronic, s/p AV nodal ablation and BiV pacing.  -Continue xarelto daily.   4. PVCs/VT: Frequent PVCs have limited BiV pacing. Not a good PVC ablation  candidate per EP. Had VT in 1/21. Cannot tolerate amiodarone, sotolol, or Tikosyn. Did not tolerate mexilitine with nausea. Had nausea also with ranolazine. Additionally, did not tolerate further uptitration of bisoprolol.  VT during pulmonary hemorrhage episode in setting of low oxygen saturation.  - Continue po amiodarone.  5. Mitral regurgitation: Per Dr Aundra Dubin --> At least 3+ MR, suspect functional due to dilated annulus with severely dilated LA.  TR appears worse (clearly severe).  Discussed with Dr. Burt Knack, do not think that she would be a good Mitraclip candidate as TR is clearly worse than MR.  6. AKI on CKD stage 3: Creatinine 1.1 today, stable.  - Daily BMET 7. Debility: PT/OT consult => recommend SNF. Needs to work with PT daily, out of bed as much as possible.   She is ready for discharge to facility when available, trying to get in [facility].  We have been discussing eventual hospice once she leaves rehab, would like to try to get her independent enough to go home with hospice (plan currently for palliative home milrinone so will need [facility] hospice).  Note  palliative care plan for family meeting, think this will be helpful.   [name] 08/12/2020 2:45 PM

## 2020-08-12 NOTE — Progress Notes (Signed)
Offered to ambulate patient she refused stated not getting up today,did not feel like it. We offered to put her in the geri-chair stated not today. She did tell me that she is slowly dying maybe a while or tomarrow she did not know. She requested Lorazepam for her nerves today given.

## 2020-08-12 NOTE — TOC Progression Note (Addendum)
Transition of Care [facility]) - Progression Note    Patient Details  Name: Cheryl Vargas MRN: [id number] Date of Birth: 12-16-1934  Transition of Care [facility]) CM/SW [place], [place] Phone Number: 08/12/2020, 11:46 AM  Clinical Narrative:    Peer to peer allowed. Auth for seven days, SNF [id number], PTAR [id number], HTA will need to know the choice once confirmed.   At this time, [facility] is the only SNF administering IV milrinone, they care currently on an admissions hold. CSW will follow up to see when hold is lifted. Family is ok with [facility] as SNF choice.   Expected Discharge Plan: [place] Barriers to Discharge: No Barriers Identified  Expected Discharge Plan and Services Expected Discharge Plan: [place] In-house Referral: Clinical Social Work   Post Acute Care Choice: [place] Living arrangements for the past 2 months: [place]                                       Social Determinants of Health (SDOH) Interventions    Readmission Risk Interventions No flowsheet data found.

## 2020-08-12 NOTE — TOC Progression Note (Signed)
Transition of Care [facility]) - Progression Note    Patient Details  Name: Cheryl Vargas MRN: [id number] Date of Birth: [birth year]  Transition of Care [facility]) CM/SW [place], [place] Phone Number: 08/12/2020, 9:23 AM  Clinical Narrative:    CSW received phone call from insurance company, initial request for SNF and PTAR denied. Peer to peer will need to be done with HTA medical director, Dr. Lynder Parents, [phone], cut off time is 3 pm today. CSW sent MD a secure chat informing him of this decision.    Expected Discharge Plan: [place] Barriers to Discharge: No Barriers Identified  Expected Discharge Plan and Services Expected Discharge Plan: [place] In-house Referral: Clinical Social Work   Post Acute Care Choice: [place] Living arrangements for the past 2 months: [place]                                       Social Determinants of Health (SDOH) Interventions    Readmission Risk Interventions No flowsheet data found.

## 2020-08-12 NOTE — Plan of Care (Signed)
  Problem: Education: Goal: Knowledge of General Education information will improve Description: Including pain rating scale, medication(s)/side effects and non-pharmacologic comfort measures Outcome: Progressing   Problem: Health Behavior/Discharge Planning: Goal: Ability to manage health-related needs will improve Outcome: Progressing   Problem: Clinical Measurements: Goal: Ability to maintain clinical measurements within normal limits will improve Outcome: Progressing Goal: Will remain free from infection Outcome: Progressing Goal: Diagnostic test results will improve Outcome: Progressing Goal: Respiratory complications will improve Outcome: Progressing Goal: Cardiovascular complication will be avoided Outcome: Progressing   Problem: Activity: Goal: Risk for activity intolerance will decrease Outcome: Progressing   Problem: Nutrition: Goal: Adequate nutrition will be maintained Outcome: Progressing   Problem: Coping: Goal: Level of anxiety will decrease Outcome: Progressing   Problem: Elimination: Goal: Will not experience complications related to bowel motility Outcome: Progressing Goal: Will not experience complications related to urinary retention Outcome: Progressing   Problem: Pain Managment: Goal: General experience of comfort will improve Outcome: Progressing   Problem: Safety: Goal: Ability to remain free from injury will improve Outcome: Progressing   Problem: Skin Integrity: Goal: Risk for impaired skin integrity will decrease Outcome: Progressing   Problem: Education: Goal: Understanding of CV disease, CV risk reduction, and recovery process will improve Outcome: Progressing   Problem: Activity: Goal: Ability to return to baseline activity level will improve Outcome: Progressing   Problem: Cardiovascular: Goal: Ability to achieve and maintain adequate cardiovascular perfusion will improve Outcome: Progressing Goal: Vascular access site(s)  Level 0-1 will be maintained Outcome: Progressing   Problem: Health Behavior/Discharge Planning: Goal: Ability to safely manage health-related needs after discharge will improve Outcome: Progressing   Problem: Education: Goal: Knowledge of General Education information will improve Description: Including pain rating scale, medication(s)/side effects and non-pharmacologic comfort measures Outcome: Progressing   Problem: Health Behavior/Discharge Planning: Goal: Ability to manage health-related needs will improve Outcome: Progressing   Problem: Clinical Measurements: Goal: Ability to maintain clinical measurements within normal limits will improve Outcome: Progressing Goal: Will remain free from infection Outcome: Progressing Goal: Diagnostic test results will improve Outcome: Progressing Goal: Respiratory complications will improve Outcome: Progressing Goal: Cardiovascular complication will be avoided Outcome: Progressing   Problem: Activity: Goal: Risk for activity intolerance will decrease Outcome: Progressing   Problem: Nutrition: Goal: Adequate nutrition will be maintained Outcome: Progressing   Problem: Coping: Goal: Level of anxiety will decrease Outcome: Progressing   Problem: Elimination: Goal: Will not experience complications related to bowel motility Outcome: Progressing Goal: Will not experience complications related to urinary retention Outcome: Progressing   Problem: Pain Managment: Goal: General experience of comfort will improve Outcome: Progressing   Problem: Safety: Goal: Ability to remain free from injury will improve Outcome: Progressing   Problem: Skin Integrity: Goal: Risk for impaired skin integrity will decrease Outcome: Progressing   Problem: Education: Goal: Understanding of CV disease, CV risk reduction, and recovery process will improve Outcome: Progressing Goal: Individualized Educational Video(s) Outcome: Progressing    Problem: Activity: Goal: Ability to return to baseline activity level will improve Outcome: Progressing   Problem: Cardiovascular: Goal: Ability to achieve and maintain adequate cardiovascular perfusion will improve Outcome: Progressing Goal: Vascular access site(s) Level 0-1 will be maintained Outcome: Progressing   Problem: Health Behavior/Discharge Planning: Goal: Ability to safely manage health-related needs after discharge will improve Outcome: Progressing

## 2020-08-12 NOTE — Progress Notes (Signed)
Occupational Therapy Treatment Patient Details Name: Cheryl Vargas MRN: [id number] DOB: 02-Feb-1935 Today's Date: 08/12/2020    History of present illness Pt is an 85 year old woman admtted on 08/04/20 with acute on chronic hear failure. Hospital course complicated by sudden massive hemoptysis during cardiac catherization on 11/24 requiring emergent intubation and bronchoscopy. Extubated 11/25. PMH: NICM with EF 20%, HTN, HLD, PAF.   OT comments  Patient continues to make incremental progress towards goals in skilled OT session. Patient's session encompassed bed mobility for peri-care and BLE exercises sitting EOB. Pt remains limited by fatigue, activity tolerance and endurance, and R hip pain. Pt deferring to sit in chair at end of session but able to complete 5 mini sit<>stands to elevate self towards EOB. Discharge remains appropriate, therapy to continue to follow.    Follow Up Recommendations  SNF;Supervision/Assistance - 24 hour    Equipment Recommendations  3 in 1 bedside commode    Recommendations for Other Services      Precautions / Restrictions Precautions Precautions: Fall Precaution Comments: watch 02 Restrictions Weight Bearing Restrictions: No       Mobility Bed Mobility Overal bed mobility: Needs Assistance Bed Mobility: Supine to Sit;Sit to Supine Rolling: Min guard   Supine to sit: Min assist Sit to supine: Mod assist   General bed mobility comments: Cues for initiation, assist for trunk to upright, mod to get legs back in bed  Transfers                 General transfer comment: deferred due to fatigue    Balance Overall balance assessment: Needs assistance Sitting-balance support: Feet supported Sitting balance-Leahy Scale: Fair                                     ADL either performed or assessed with clinical judgement   ADL Overall ADL's : Needs assistance/impaired                             Toileting-  Clothing Manipulation and Hygiene: Total assistance;Bed level Toileting - Clothing Manipulation Details (indicate cue type and reason): Soiled upon arrival requiring total A (able to roll with extra time)     Functional mobility during ADLs: Minimal assistance;Cueing for safety;Cueing for sequencing General ADL Comments: pt continues to demonstrate poor activity tolerance and endurance to complete functional tasks     Vision       Perception     Praxis      Cognition Arousal/Alertness: Awake/alert Behavior During Therapy: Flat affect Overall Cognitive Status: Impaired/Different from baseline Area of Impairment: Problem solving                             Problem Solving: Slow processing;Requires verbal cues General Comments: Pt able to verbalize needs        Exercises General Exercises - Lower Extremity Ankle Circles/Pumps: AROM;Both;15 reps;Seated Long Arc Quad: Both;10 reps;Seated;AROM Hip Flexion/Marching: AROM;Both;15 reps;Seated Toe Raises: AROM;Both;15 reps;Seated   Shoulder Instructions       General Comments      Pertinent Vitals/ Pain       Pain Assessment: Faces Faces Pain Scale: Hurts little more Pain Location: right hip Pain Descriptors / Indicators: Discomfort;Grimacing Pain Intervention(s): Monitored during session;Limited activity within patient's tolerance  Home Living  Prior Functioning/Environment              Frequency  Min 2X/week        Progress Toward Goals  OT Goals(current goals can now be found in the care plan section)  Progress towards OT goals: Progressing toward goals  Acute Rehab OT Goals Patient Stated Goal: to get stronger OT Goal Formulation: With patient/family Time For Goal Achievement: 08/20/20 Potential to Achieve Goals: [place] Discharge plan remains appropriate    Co-evaluation                 AM-PAC OT "6 Clicks" Daily  Activity     Outcome Measure   Help from another person eating meals?: A Little Help from another person taking care of personal grooming?: A Lot Help from another person toileting, which includes using toliet, bedpan, or urinal?: Total Help from another person bathing (including washing, rinsing, drying)?: A Lot Help from another person to put on and taking off regular upper body clothing?: A Lot Help from another person to put on and taking off regular lower body clothing?: Total 6 Click Score: 11    End of Session Equipment Utilized During Treatment: Gait belt  OT Visit Diagnosis: Unsteadiness on feet (R26.81);Other abnormalities of gait and mobility (R26.89);Muscle weakness (generalized) (M62.81);Other (comment)   Activity Tolerance Patient limited by fatigue   Patient Left in bed;with call bell/phone within reach;with bed alarm set   Nurse Communication Mobility status        Time: 7124-5809 OT Time Calculation (min): 32 min  Charges: OT General Charges $OT Visit: 1 Visit OT Treatments $Self Care/Home Management : 8-22 mins $Therapeutic Exercise: 8-22 mins  Corinne [place]. [place], [place] [facility] [place] 08/12/2020, 3:13 PM

## 2020-08-13 ENCOUNTER — Inpatient Hospital Stay (HOSPITAL_COMMUNITY): Payer: PPO

## 2020-08-13 DIAGNOSIS — Z515 Encounter for palliative care: Secondary | ICD-10-CM | POA: Diagnosis not present

## 2020-08-13 DIAGNOSIS — I5043 Acute on chronic combined systolic (congestive) and diastolic (congestive) heart failure: Secondary | ICD-10-CM | POA: Diagnosis not present

## 2020-08-13 DIAGNOSIS — Z7189 Other specified counseling: Secondary | ICD-10-CM | POA: Diagnosis not present

## 2020-08-13 LAB — BASIC METABOLIC PANEL
Anion gap: 10 (ref 5–15)
Anion gap: 10 (ref 5–15)
BUN: 15 mg/dL (ref 8–23)
BUN: 14 mg/dL (ref 8–23)
CO2: 17 mmol/L — ABNORMAL LOW (ref 22–32)
CO2: 22 mmol/L (ref 22–32)
Calcium: 7.9 mg/dL — ABNORMAL LOW (ref 8.9–10.3)
Calcium: 8.2 mg/dL — ABNORMAL LOW (ref 8.9–10.3)
Chloride: 107 mmol/L (ref 98–111)
Chloride: 101 mmol/L (ref 98–111)
Creatinine, Ser: 0.92 mg/dL (ref 0.44–1.00)
Creatinine, Ser: 1.09 mg/dL — ABNORMAL HIGH (ref 0.44–1.00)
GFR, Estimated: >60 mL/min (ref >60)
GFR, Estimated: 50 mL/min — ABNORMAL LOW (ref >60)
Glucose, Bld: 105 mg/dL — ABNORMAL HIGH (ref 70–99)
Glucose, Bld: 149 mg/dL — ABNORMAL HIGH (ref 70–99)
Potassium: 5.7 mmol/L — ABNORMAL HIGH (ref 3.5–5.1)
Potassium: 4.6 mmol/L (ref 3.5–5.1)
Sodium: 134 mmol/L — ABNORMAL LOW (ref 135–145)
Sodium: 133 mmol/L — ABNORMAL LOW (ref 135–145)

## 2020-08-13 LAB — COOXEMETRY PANEL
Carboxyhemoglobin: 1.3 % (ref 0.5–1.5)
Methemoglobin: 0.8 % (ref 0.0–1.5)
O2 Saturation: 62.5 %
Total hemoglobin: 12.5 g/dL (ref 12.0–16.0)

## 2020-08-13 MED ORDER — ACETAMINOPHEN 500 MG PO TABS
1000.0000 mg | ORAL_TABLET | Freq: Three times a day (TID) | ORAL | Status: DC
  Administered 2020-08-13 – 2020-08-18 (×8): 1000 mg via ORAL
  Filled 2020-08-13 (×15): qty 2

## 2020-08-13 MED ORDER — CALCIUM CARBONATE ANTACID 500 MG PO CHEW
1.0000 | CHEWABLE_TABLET | Freq: Three times a day (TID) | ORAL | Status: DC
  Administered 2020-08-13 – 2020-08-15 (×6): 200 mg via ORAL
  Filled 2020-08-13 (×7): qty 1

## 2020-08-13 MED ORDER — MORPHINE SULFATE (PF) 2 MG/ML IV SOLN: 1.0000 mg | INTRAVENOUS | Status: DC | PRN

## 2020-08-13 MED ORDER — LIDOCAINE 5 % EX PTCH
1.0000 | MEDICATED_PATCH | CUTANEOUS | Status: DC
  Administered 2020-08-14 – 2020-08-17 (×4): 1 via TRANSDERMAL
  Filled 2020-08-13 (×5): qty 1

## 2020-08-13 MED ORDER — MORPHINE SULFATE (PF) 2 MG/ML IV SOLN
0.5000 mg | INTRAVENOUS | Status: DC | PRN
  Administered 2020-08-13: 0.5 mg via INTRAVENOUS
  Filled 2020-08-13 (×2): qty 1

## 2020-08-13 MED ORDER — TRAMADOL HCL 50 MG PO TABS: 50.0000 mg | ORAL_TABLET | Freq: Four times a day (QID) | ORAL | Status: DC | PRN

## 2020-08-13 NOTE — Progress Notes (Signed)
CSW received return call from patient's son who states he has spoken with his siblings. They plan to meet with patient tomorrow as a family and discuss options and gather patient's wishes for discharge. Son states he is still exploring possible options for SNF rehab placement and verbalizes understanding that two local facilities may not be an option. He has some contacts near his home in [place] and plans to explore over the weekend. CSW informed Dr Aundra Dubin, Vinie Sill, NP and [facility] CSW on 3E of family plans for the weekend to discuss with patient. CSW available as needed. Raquel Sarna, [place], [place]

## 2020-08-13 NOTE — Progress Notes (Signed)
Outpatient H&V CSW referred to assist with options for patient post discharge. CSW contacted patient's son [name] to discuss options. Son informed that [facility] CSW reached out to SNF facilities but due to IV milrinone there are limited options for SNF and neither of the two facilities have beds available at this time. Option#2 would be to consider hospice at home with [facility] and keeping the IV milrinone going at home with family providing the 24/7 needed supervision. Option #3 would be to consider referral to inpatient hospice at [facility] although the IV milrinone would be turned off. Patient son asked appropriate questions and asked about pursuing SNF placement for rehab and turning off the milrinone to allow other facilities as an option. CSW explained that the understanding from the MD is that the patient will have limited time if the milrinone is turned off at hospital discharge and would not be able to complete a SNF rehab stay. Son verbalizes understanding of current situation and states that he believes that his mom would want to go home for her final days and that seems to be the option. Son states he will contact his siblings via phone and discuss further with them a plan to support patient at home with hospice care and return call to CSW this afternoon. CSW informed MD, Vinie Sill, NP and [facility] CSW of conversation with son [name]. CSW will keep all updated as information becomes available. Raquel Sarna, [place], [place]

## 2020-08-13 NOTE — Progress Notes (Signed)
Patient ID: Cheryl Vargas, female   DOB: 1935/07/31, 85 y.o.   MRN: [id number]       Advanced Heart Failure Rounding Note  PCP-Cardiologist: Cheryl Grayer, MD   Subjective:    Had localized pulmonary artery perforation during cath on 11/24. Intubated and underwent successful coil embolization with IR. Had VT x 1 during cath and shocked.  Extubated 11/25.  No further hemoptysis.   Remains on milrinone 0.25 mcg, co-ox 63%.  Remains on midodrine 10 tid.  CVP 6. K high at 5.7 today but no Lasix yesterday and got Lasix this morning.    Main complaint today is right hip pain, this limits her activity. Fatigued but no dyspnea at rest. Hip plain films today without acute abnormality.   - ECHO EF 20-25% RV normal, biatrial enlargement, IVC normal.   - RHC 11/24  Procedural Findings (incomplete study due to emergency): Hemodynamics (mmHg) RA mean 6 PA 41/16, mean 26 PCWP mean 19 Oxygen saturations: PA 57% AO 93% Cardiac Output (Fick) 2.92  Cardiac Index (Fick) 1.8    Objective:   Weight Range: 57.3 kg Body mass index is 21.02 kg/m.   Vital Signs:   Temp:  [97.8 F (36.6 C)-98.1 F (36.7 C)] 97.9 F (36.6 C) (12/03 1150) Pulse Rate:  [73-80] 73 (12/03 1150) Resp:  [11-22] 22 (12/03 0425) BP: (102-111)/(55-67) 106/62 (12/03 0946) SpO2:  [96 %-99 %] 99 % (12/03 1150) Weight:  [57.3 kg] 57.3 kg (12/03 0426) Last BM Date: 08/13/20  Weight change: Filed Weights   08/11/20 0500 08/12/20 0418 08/13/20 0426  Weight: 60.5 kg 61.5 kg 57.3 kg    Intake/Output:   Intake/Output Summary (Last 24 hours) at 08/13/2020 1245 Last data filed at 08/12/2020 2100 Gross per 24 hour  Intake 480 ml  Output 200 ml  Net 280 ml      Physical Exam   CVP 6 General: NAD, frail Neck: No JVD, no thyromegaly or thyroid nodule.  Lungs: Clear to auscultation bilaterally with normal respiratory effort. CV: Nondisplaced PMI.  Heart regular S1/S2, no S3/S4, no murmur.  No peripheral edema.     Abdomen: Soft, nontender, no hepatosplenomegaly, no distention.  Skin: Intact without lesions or rashes.  Neurologic: Alert and oriented x 3.  Psych: Normal affect. Extremities: No clubbing or cyanosis.  HEENT: Normal.    Telemetry   BiV paced 80s, underlying atrial fibrillation.  Personally reviewed.   Labs    CBC Recent Labs    08/11/20 0500  WBC 7.1  HGB 11.8*  HCT 34.5*  MCV 90.1  PLT 502   Basic Metabolic Panel Recent Labs    08/12/20 0450 08/13/20 0318  NA 134* 134*  K 4.9 5.7*  CL 103 107  CO2 23 17*  GLUCOSE 92 105*  BUN 16 15  CREATININE 1.14* 0.92  CALCIUM 8.2* 7.9*   Liver Function Tests No results for input(s): AST, ALT, ALKPHOS, BILITOT, PROT, ALBUMIN in the last 72 hours. No results for input(s): LIPASE, AMYLASE in the last 72 hours. Cardiac Enzymes No results for input(s): CKTOTAL, CKMB, CKMBINDEX, TROPONINI in the last 72 hours.  BNP: BNP (last 3 results) Recent Labs    09/27/19 1708 08/02/20 0832  BNP 1,032.7* 1,285.0*    ProBNP (last 3 results) No results for input(s): PROBNP in the last 8760 hours.   D-Dimer No results for input(s): DDIMER in the last 72 hours. Hemoglobin A1C No results for input(s): HGBA1C in the last 72 hours. Fasting Lipid Panel No results  for input(s): CHOL, HDL, LDLCALC, TRIG, CHOLHDL, LDLDIRECT in the last 72 hours. Thyroid Function Tests No results for input(s): TSH, T4TOTAL, T3FREE, THYROIDAB in the last 72 hours.  Invalid input(s): FREET3  Other results:   Imaging    DG HIP UNILAT WITH PELVIS 2-3 VIEWS RIGHT  Result Date: 08/13/2020 CLINICAL DATA:  Right hip pain following CPR. EXAM: DG HIP (WITH OR WITHOUT PELVIS) 2-3V RIGHT COMPARISON:  None. FINDINGS: No acute fracture or hip dislocation is identified. There is mild right hip joint space narrowing. A left total hip arthroplasty is incompletely imaged. Degenerative appearing sclerosis and narrowing are noted at the pubic symphysis. There is  also spurring at the SI joints. IMPRESSION: No acute osseous abnormality identified. Electronically Signed   By: Logan Bores M.D.   On: 08/13/2020 10:30     Medications:     Scheduled Medications: . amiodarone  200 mg Oral Daily  . Chlorhexidine Gluconate Cloth  6 each Topical Daily  . docusate sodium  100 mg Oral BID  . furosemide  20 mg Oral Daily  . melatonin  6 mg Oral QHS  . midodrine  10 mg Oral TID WC  . polyethylene glycol  17 g Oral Daily  . rivaroxaban  15 mg Oral Q supper    Infusions: . milrinone 0.25 mcg/kg/min (08/13/20 0439)    PRN Medications: acetaminophen, lidocaine-EPINEPHrine, LORazepam, morphine injection, phenol, sodium chloride flush, zolpidem    Patient Profile   [name] is an 85 year old with a history ofHTN, HLD, PAF (On Xarelto), and nonischemic cardiomyopathy with chronic systolic CHF. Cardiomyopathy dates back to at least 2014. Most recent ECHO 09/2019 showed EF < 20%.  Intolerant ranolazine, losartan,spironolactone, amiodarone, sotolol, and tikosyn.  Admitted with presyncope/volume overload.   Assessment/Plan   1. Hemoptysis: Due to small, isolated right PA branch rupture on 11/24. She was intubated and underwent bronchoscopy with clearing of blood. Suspect bleeding contained quickly. S/p coiling PA branch in IR 11/24.  Hgb stable.  Hemoptysis resolved.  - Back on Xarelto.  2. Acute on chronic systolic HF: [place] 05/7352 showed no significant coronary disease. Cardiac MRI 10/17 EF 36%, diffuse HK, normal RV, biatrial enlargement, moderate MR, non-coronary LGE pattern involving the mid-wall of the basal to mid septum and inferior wall. Possibly prior myocarditis versus a form of infiltrative disease. Medtronic CRT-D s/p AV nodal ablation. Echo in 1/21 showed EF <20% with moderate LV dilation and mildly decreased RV systolic function. Low output on 1/21 RHC (CI 1.72). With age and frailty, LVAD not thought to be good option. At last  appointment, she was not BiV pacing at an ideal percentage, likely due to PVCs. Symptomatically worse, NYHA class IIIb since decreasing Lasix in setting of rising creatinine. Low output HF noted this admission as well, CI on RHC was 1.8.  She was on milrinone 0.125 prior to [place].  EP has seen, CRT device adjusted and QRS now narrower.  Echo this admission with EF 20-25%, RV low normal systolic function, severe MR and TR.  She diuresed well this admission, PCWP 19 on RHC.  As above, was intubated with pulmonary hemorrhage post-RHC, now extubated and stable.  Off NE and on milrinone 0.25 currently.  She has end-stage CHF and is inotrope-dependent.  Co-ox 63%, CVP 6. K high but did not get Lasix yesterday and got this morning. Creatinine stable.  - Recheck BMET stat before giving Lokelma.  - We have been trying to get her to rehab facility on milrinone  to allow eventual discharge home on milrinone with [facility] hospice when mobility is improved, but have had multiple conversations regarding rehab beds and none are available due to [place] at [employer].  Now discussions regarding directly home with hospice, with or without milrinone.  Will need family help at home.  - Continue Lasix 20 mg daily.  - Continue midodrine 10 mg tid.  -Off digoxin with persistently elevated level despite cutting back to 0.0625 mg qod. - Concerned that frequent PVCs may be affecting function (and also limiting BiV pacing percentage) => Retrying her on amiodarone. Tolerating so far.  Would stop at discharge if home with hospice.  3. Atrial fibrillation: Chronic, s/p AV nodal ablation and BiV pacing.  -Continue xarelto daily.   4. PVCs/VT: Frequent PVCs have limited BiV pacing. Not a good PVC ablation candidate per EP. Had VT in 1/21. Cannot tolerate amiodarone, sotolol, or Tikosyn. Did not tolerate mexilitine with nausea. Had nausea also with ranolazine. Additionally, did not tolerate further uptitration of bisoprolol.  VT  during pulmonary hemorrhage episode in setting of low oxygen saturation.  - Continue po amiodarone (stop at discharge if home with hospice).  5. Mitral regurgitation: At least 3+ MR, suspect functional due to dilated annulus with severely dilated LA.  TR appears worse (clearly severe).  Discussed with Dr. Burt Knack, do not think that she would be a good Mitraclip candidate as TR is clearly worse than MR.  6. AKI on CKD stage 3: Creatinine 0.92 today but high K.  Just got Lasix (did not get yesterday).   - Repeat BMET stat, Lokelma if K > 5.2.  7. Debility: PT/OT consult => recommend SNF. Needs to work with PT daily, out of bed as much as possible.   Multiple discussions today with family and social work.  Unable to get [facility] bed (only option with milrinone) due to COVID outbreak.  No beds at [facility].  Family will need to discuss, but possibly will aim for directly home with or without milrinone on hospice.  One of her children will have to arrange to stay with her in that situation due to current lack of mobility.  Palliative care will need to reach out to them regarding decision.  I think she will be here through the weekend at least while family arranges discharge situation.    [name] 08/13/2020 12:45 PM

## 2020-08-13 NOTE — Progress Notes (Signed)
Palliative:  HPI:85 y.o.femalewith past medical history of NICM, systolic CHF EF <41%, HTN, HLD, afib (on Xarelto), s/p Medtronic CRT-D, CKD stage 3admitted on 11/22/2021with increased shortness of breath related to CHF exacerbation and worsening renal function.Hospitalization complicated by hemoptysis and has required intubation. Also requiring milrinone infusion. MOST form in records completed by outpatient palliative care for following: DNR, limited interventions (would consider ICU admission but no desire for intubation), ok with antibiotics, ok with tube feedings depending on long term prognosis.  I met today at Cheryl Vargas's bedside. She is going down for hip xray as she has been having pain. I discussed with son, Cheryl Vargas, at bedside. He expresses there has been a lot of confusion and concern with where she goes from here. She has been awaiting SNF rehab with milrinone but only one option for placement. They are not pleased with the option. I did let him know I will reach out to contacts to double check this is the only option.   Cheryl Vargas and I also discussed that family should be planning for a alternative option in case she gets to SNF and doesn't do well or hates it then it would be a good idea to have ideas for care that may allow her to go home as she wishes. Cheryl Vargas shares that he believe Cheryl Vargas has been working on this. We did discuss that there is the possibility that she does not have much improvement in rehab as well and I do anticipate she will need extra help at home even in best scenario.   Cheryl Vargas also asked about DNR status. He has some confusion about why her wishes have changed. We discussed that with her heart function has declined and if her heart were to stop there is not enough function left for CPR to be helpful to her but likely to only provide her pain and suffering at end of life. I do believe it is the progression of disease that has altered her wishes. Cheryl Vargas verbalizes understanding.   I  spoke with Cheryl Vargas (Heart Failure CSW) who has reached out to [facility]. The only SNF option is currently having COVID outbreak and not an option for her. She has discussed hospice at home (with or without milrinone) or hospice facility without milrinone. I touched base with Cheryl Vargas who plans to have discussions with his siblings and plans to be present in hospital tomorrow to discuss further with his mother and family.   All questions/concerns addressed to best of my ability. Emotional support provided.   Exam: Alert, oriented. Tired/fatigued. Reports good relief with morphine for hip pain. Breathing regular, unlabored. Abd flat.   Plan: - Ongoing family discussions for disposition options and goals from the hospital.   50 min  Vinie Sill, NP Palliative Medicine Team Pager [phone] (Please see [email or web address] for schedule) Team Phone [phone]    Greater than 50%  of this time was spent counseling and coordinating care related to the above assessment and plan

## 2020-08-13 NOTE — TOC Progression Note (Signed)
Transition of Care [facility]) - Progression Note    Patient Details  Name: Cheryl Vargas MRN: [id number] Date of Birth: [birth year]  [facility] CM/SW [place], [place] Phone Number: 08/13/2020, 11:12 AM  Clinical Narrative:    CSW reached out to [facility] and spoke with Roselyn Reef who confirmed that they cannot accept pt due to no staff available to administer the IV Milrinone due to recent covid outbreak. CSW reached out to: [facility], [facility], and [facility], none of the SNFs are able to take pt on IV Milrinone. CSW will have a conversation with pt's family. CSW has reached out to leadership for assistance in placement for pt.     Expected Discharge Plan: [place] Barriers to Discharge: No Barriers Identified  Expected Discharge Plan and Services Expected Discharge Plan: [place] In-house Referral: Clinical Social Work   Post Acute Care Choice: [place] Living arrangements for the past 2 months: [place]                                       Social Determinants of Health (SDOH) Interventions    Readmission Risk Interventions No flowsheet data found.

## 2020-08-13 NOTE — Progress Notes (Signed)
Physical Therapy Treatment Patient Details Name: Cheryl Vargas MRN: [id number] DOB: [birth year] Today's Date: 08/13/2020    History of Present Illness Pt is an 85 year old woman admtted on 08/04/20 with acute on chronic hear failure. Hospital course complicated by sudden massive hemoptysis during cardiac catherization on 11/24 requiring emergent intubation and bronchoscopy. Extubated 11/25. PMH: NICM with EF 20%, HTN, HLD, PAF.    PT Comments    Pt was seen for mobility on RW bedside after noting her significant increases in HR today with bed posture.  With care was able to increase distances on RW sidestepping, and then to chair.  Pt is very motivated to get moving, able to contain the instability she is feeling in knees when walking. Follow acutely for this need and to address goals of PT as written.  Follow Up Recommendations  SNF     Equipment Recommendations  None recommended by PT    Recommendations for Other Services       Precautions / Restrictions Precautions Precautions: Fall Precaution Comments: watch HR and sats Restrictions Weight Bearing Restrictions: No    Mobility  Bed Mobility Overal bed mobility: Needs Assistance Bed Mobility: Supine to Sit Rolling: Min assist   Supine to sit: Min assist     General bed mobility comments: used bed rail for light support   Transfers Overall transfer level: Needs assistance Equipment used: Rolling walker (2 wheeled) Transfers: Sit to/from Stand Sit to Stand: Min guard;Min assist         General transfer comment: stood with PT and had some shakiness in knees but no outright collapse or sinking  Ambulation/Gait Ambulation/Gait assistance: Min guard;Min assist Gait Distance (Feet): 20 Feet Assistive device: Rolling walker (2 wheeled) Gait Pattern/deviations: Step-to pattern;Shuffle;Wide base of support Gait velocity: decreased Gait velocity interpretation: <1.8 ft/sec, indicate of risk for recurrent  falls General Gait Details: longer walk but monitored for HR elevation at times   Stairs             Wheelchair Mobility    Modified Rankin (Stroke Patients Only)       Balance Overall balance assessment: Needs assistance Sitting-balance support: Feet supported Sitting balance-Leahy Scale: Fair     Standing balance support: Bilateral upper extremity supported Standing balance-Leahy Scale: Poor Standing balance comment: support due to shakiness of knees                            Cognition Arousal/Alertness: Awake/alert Behavior During Therapy: WFL for tasks assessed/performed Overall Cognitive Status: No family/caregiver present to determine baseline cognitive functioning Area of Impairment: Awareness;Problem solving                           Awareness: Intellectual Problem Solving: Slow processing;Requires verbal cues;Requires tactile cues General Comments: pt asking for mobility but did not process PT concerns about her HR      Exercises      General Comments General comments (skin integrity, edema, etc.): Pt is hindered from longer walk by both HR elevations and shaking of knees but increased her trip today      Pertinent Vitals/Pain Pain Assessment: No/denies pain    Home Living                      Prior Function            PT Goals (current goals can now be found  in the care plan section) Acute Rehab PT Goals Patient Stated Goal: get moving Progress towards PT goals: Progressing toward goals    Frequency    Min 3X/week      PT Plan Current plan remains appropriate    Co-evaluation              AM-PAC PT "6 Clicks" Mobility   Outcome Measure  Help needed turning from your back to your side while in a flat bed without using bedrails?: None Help needed moving from lying on your back to sitting on the side of a flat bed without using bedrails?: A Little Help needed moving to and from a bed to a  chair (including a wheelchair)?: A Little Help needed standing up from a chair using your arms (e.g., wheelchair or bedside chair)?: A Little Help needed to walk in hospital room?: A Little Help needed climbing 3-5 steps with a railing? : A Lot 6 Click Score: 18    End of Session Equipment Utilized During Treatment: Gait belt;Oxygen Activity Tolerance: Patient tolerated treatment well;Patient limited by fatigue;Treatment limited secondary to medical complications (Comment) Patient left: in chair;with call bell/phone within reach Nurse Communication: Mobility status PT Visit Diagnosis: Muscle weakness (generalized) (M62.81);Unsteadiness on feet (R26.81)     Time: 1282-0813 PT Time Calculation (min) (ACUTE ONLY): 27 min  Charges:  $Gait Training: 8-22 mins $Therapeutic Activity: 8-22 mins                  Ramond Dial 08/13/2020, 1:03 PM  Mee Hives, PT MS Acute Rehab Dept. Number: [place] and [place]

## 2020-08-13 NOTE — Care Management Important Message (Signed)
Important Message  Patient Details  Name: Cheryl Vargas MRN: [id number] Date of Birth: 03-28-1935   Medicare Important Message Given:  Yes     Jamile Rekowski P Allani Reber 08/13/2020, 2:55 PM

## 2020-08-14 DIAGNOSIS — I5023 Acute on chronic systolic (congestive) heart failure: Secondary | ICD-10-CM | POA: Diagnosis not present

## 2020-08-14 DIAGNOSIS — Z515 Encounter for palliative care: Secondary | ICD-10-CM | POA: Diagnosis not present

## 2020-08-14 DIAGNOSIS — I5043 Acute on chronic combined systolic (congestive) and diastolic (congestive) heart failure: Secondary | ICD-10-CM | POA: Diagnosis not present

## 2020-08-14 LAB — COOXEMETRY PANEL
Carboxyhemoglobin: 1.5 % (ref 0.5–1.5)
Methemoglobin: 0.7 % (ref 0.0–1.5)
O2 Saturation: 60.2 %
Total hemoglobin: 11.7 g/dL — ABNORMAL LOW (ref 12.0–16.0)

## 2020-08-14 LAB — BASIC METABOLIC PANEL
Anion gap: 7 (ref 5–15)
BUN: 14 mg/dL (ref 8–23)
CO2: 23 mmol/L (ref 22–32)
Calcium: 7.9 mg/dL — ABNORMAL LOW (ref 8.9–10.3)
Chloride: 105 mmol/L (ref 98–111)
Creatinine, Ser: 1.04 mg/dL — ABNORMAL HIGH (ref 0.44–1.00)
GFR, Estimated: 53 mL/min — ABNORMAL LOW (ref >60)
Glucose, Bld: 89 mg/dL (ref 70–99)
Potassium: 4.1 mmol/L (ref 3.5–5.1)
Sodium: 135 mmol/L (ref 135–145)

## 2020-08-14 NOTE — Progress Notes (Signed)
Patient ID: Cheryl Vargas, female   DOB: Sep 08, 1935, 85 y.o.   MRN: [id number]       Advanced Heart Failure Rounding Note  PCP-Cardiologist: [name], MD   Subjective:    Had localized pulmonary artery perforation during cath on 11/24. Intubated and underwent successful coil embolization with IR. Had VT x 1 during cath and shocked.  Extubated 11/25.  No further hemoptysis.   Remains on milrinone 0.25 mcg, co-ox 60%.  Remains on midodrine 10 tid.  CVP 6  Feels great today. More mentally clear. Hip pain resolved with lidocaine. No cp or sob.   - ECHO EF 20-25% RV normal, biatrial enlargement, IVC normal.   - RHC 11/24  Procedural Findings (incomplete study due to emergency): Hemodynamics (mmHg) RA mean 6 PA 41/16, mean 26 PCWP mean 19 Oxygen saturations: PA 57% AO 93% Cardiac Output (Fick) 2.92  Cardiac Index (Fick) 1.8    Objective:   Weight Range: 58.9 kg Body mass index is 21.61 kg/m.   Vital Signs:   Temp:  [97.8 F (36.6 C)-98.1 F (36.7 C)] 98 F (36.7 C) (12/04 0521) Pulse Rate:  [80-96] 96 (12/04 1221) Resp:  [16-19] 16 (12/04 0730) BP: (97-120)/(46-77) 114/57 (12/04 1221) SpO2:  [95 %-96 %] 95 % (12/04 0730) Weight:  [58.9 kg] 58.9 kg (12/04 0500) Last BM Date: 08/13/20  Weight change: Filed Weights   08/12/20 0418 08/13/20 0426 08/14/20 0500  Weight: 61.5 kg 57.3 kg 58.9 kg    Intake/Output:   Intake/Output Summary (Last 24 hours) at 08/14/2020 1456 Last data filed at 08/14/2020 1200 Gross per 24 hour  Intake 530.06 ml  Output 300 ml  Net 230.06 ml      Physical Exam   General:  Elderly frail No resp difficulty HEENT: normal Neck: supple. no JVD. Carotids 2+ bilat; no bruits. No lymphadenopathy or thryomegaly appreciated. Cor: PMI nondisplaced. Regular rate & rhythm. No rubs, gallops or murmurs. Lungs: clear Abdomen: soft, nontender, nondistended. No hepatosplenomegaly. No bruits or masses. Good bowel sounds. Extremities: no  cyanosis, clubbing, rash, edema Neuro: alert & orientedx3, cranial nerves grossly intact. moves all 4 extremities w/o difficulty. Affect pleasant   Telemetry   BiV paced 80s underlying atrial fibrillation.  Personally reviewed  Labs    CBC No results for input(s): WBC, NEUTROABS, HGB, HCT, MCV, PLT in the last 72 hours. Basic Metabolic Panel Recent Labs    08/13/20 1318 08/14/20 0448  NA 133* 135  K 4.6 4.1  CL 101 105  CO2 22 23  GLUCOSE 149* 89  BUN 14 14  CREATININE 1.09* 1.04*  CALCIUM 8.2* 7.9*   Liver Function Tests No results for input(s): AST, ALT, ALKPHOS, BILITOT, PROT, ALBUMIN in the last 72 hours. No results for input(s): LIPASE, AMYLASE in the last 72 hours. Cardiac Enzymes No results for input(s): CKTOTAL, CKMB, CKMBINDEX, TROPONINI in the last 72 hours.  BNP: BNP (last 3 results) Recent Labs    09/27/19 1708 08/02/20 0832  BNP 1,032.7* 1,285.0*    ProBNP (last 3 results) No results for input(s): PROBNP in the last 8760 hours.   D-Dimer No results for input(s): DDIMER in the last 72 hours. Hemoglobin A1C No results for input(s): HGBA1C in the last 72 hours. Fasting Lipid Panel No results for input(s): CHOL, HDL, LDLCALC, TRIG, CHOLHDL, LDLDIRECT in the last 72 hours. Thyroid Function Tests No results for input(s): TSH, T4TOTAL, T3FREE, THYROIDAB in the last 72 hours.  Invalid input(s): FREET3  Other results:  Imaging    No results found.   Medications:     Scheduled Medications: . acetaminophen  1,000 mg Oral Q8H  . amiodarone  200 mg Oral Daily  . calcium carbonate  1 tablet Oral TID WC  . Chlorhexidine Gluconate Cloth  6 each Topical Daily  . docusate sodium  100 mg Oral BID  . furosemide  20 mg Oral Daily  . lidocaine  1 patch Transdermal Q24H  . melatonin  6 mg Oral QHS  . midodrine  10 mg Oral TID WC  . polyethylene glycol  17 g Oral Daily  . rivaroxaban  15 mg Oral Q supper    Infusions: . milrinone 0.25  mcg/kg/min (08/14/20 0549)    PRN Medications: lidocaine-EPINEPHrine, LORazepam, morphine injection, phenol, sodium chloride flush, zolpidem    Patient Profile   Cheryl Vargas is an 85 year old with a history ofHTN, HLD, PAF (On Xarelto), and nonischemic cardiomyopathy with chronic systolic CHF. Cardiomyopathy dates back to at least 2014. Most recent ECHO 09/2019 showed EF < 20%.  Intolerant ranolazine, losartan,spironolactone, amiodarone, sotolol, and tikosyn.  Admitted with presyncope/volume overload.   Assessment/Plan   1. Hemoptysis: Due to small, isolated right PA branch rupture on 11/24. She was intubated and underwent bronchoscopy with clearing of blood. Suspect bleeding contained quickly. S/p coiling PA branch in IR 11/24.  Hgb stable.  Hemoptysis resolved.  - Back on Xarelto. No change. Hgb 11.7 on co-ox 2. Acute on chronic systolic HF: [place] 03/5642 showed no significant coronary disease. Cardiac MRI 10/17 EF 36%, diffuse HK, normal RV, biatrial enlargement, moderate MR, non-coronary LGE pattern involving the mid-wall of the basal to mid septum and inferior wall. Possibly prior myocarditis versus a form of infiltrative disease. Medtronic CRT-D s/p AV nodal ablation. Echo in 1/21 showed EF <20% with moderate LV dilation and mildly decreased RV systolic function. Low output on 1/21 RHC (CI 1.72). With age and frailty, LVAD not thought to be good option. At last appointment, she was not BiV pacing at an ideal percentage, likely due to PVCs. Symptomatically worse, NYHA class IIIb since decreasing Lasix in setting of rising creatinine. Low output HF noted this admission as well, CI on RHC was 1.8.  She was on milrinone 0.125 prior to [place].  EP has seen, CRT device adjusted and QRS now narrower.  Echo this admission with EF 20-25%, RV low normal systolic function, severe MR and TR.  She diuresed well this admission, PCWP 19 on RHC.  As above, was intubated with pulmonary hemorrhage  post-RHC, now extubated and stable.  Off NE and on milrinone 0.25 currently.  She has end-stage CHF and is inotrope-dependent.  Co-ox 60%, CVP 6. Back on low-dose po lasix 20 daily. K down to 4.1 - We have been trying to get her to rehab facility on milrinone to allow eventual discharge home on milrinone with [facility] hosp[facility] when mobility is improved, but have had multiple conversations regarding rehab beds and none are available due to [place] at [employer].  Now discussions regarding directly home with hospice, with or without milrinone.  Will need family help at home.  - Continue Lasix 20 mg daily.  - Continue midodrine 10 mg tid.  -Off digoxin with persistently elevated level despite cutting back to 0.0625 mg qod. - Concerned that frequent PVCs may be affecting function (and also limiting BiV pacing percentage) => Retrying her on amiodarone. Tolerating so far.  Would stop at discharge if home with hospice.  3. Atrial fibrillation:  Chronic, s/p AV nodal ablation and BiV pacing.  -Continue xarelto daily.  Hgb stable 4. PVCs/VT: Frequent PVCs have limited BiV pacing. Not a good PVC ablation candidate per EP. Had VT in 1/21. Cannot tolerate amiodarone, sotolol, or Tikosyn. Did not tolerate mexilitine with nausea. Had nausea also with ranolazine. Additionally, did not tolerate further uptitration of bisoprolol.  VT during pulmonary hemorrhage episode in setting of low oxygen saturation.  - Continue po amiodarone (stop at discharge if home with hospice).  5. Mitral regurgitation: At least 3+ MR, suspect functional due to dilated annulus with severely dilated LA.  TR appears worse (clearly severe).  Discussed with Dr. Burt Knack, do not think that she would be a good Mitraclip candidate as TR is clearly worse than MR.  6. AKI on CKD stage 3: Stable. Creatinine 1.04. K 4.1   7. Debility: PT/OT consult => recommend SNF. Needs to work with PT daily, out of bed as much as possible.   Ongoing  discussions with family and social work.  Unable to get [facility] bed (only option with milrinone) due to COVID outbreak.  No beds at [facility].  Family will need to discuss, but possibly will aim for directly home with or without milrinone on hospice.  One of her children will have to arrange to stay with her in that situation due to current lack of mobility.  Palliative care will need to reach out to them regarding decision.   Glori Bickers MD 08/14/2020 2:56 PM

## 2020-08-14 NOTE — Progress Notes (Signed)
Daily Progress Note   Patient Name: Cheryl Vargas       Date: 08/14/2020 DOB: 08-26-1935  Age: 85 y.o. MRN#: [id number] Attending Physician: Larey Dresser, MD Primary Care Physician: Lavone Orn, MD Admit Date: 08/02/2020  Reason for Consultation/Follow-up:  Palliative follow up, confirmation of goals, and disposition   Subjective: Spoke with patient, daughter Cheryl Vargas and son Cheryl Vargas at bedside.  Patient is feeling well.  Denies pain, SOB, confusion, constipation, dysuria.  She has been out of bed and able to walk a bit.   Her son Cheryl Vargas is about to do PT with her as the [facility] PT therapists are unavailable today.    Both Mrs. Westall and Cheryl Vargas tell me that they are set on her going to rehab with Palliative to follow.  Cheryl Vargas has learned that his [employer] with cover Acute Rehab out of network (in [place] where he lives).  He would like to work with [facility] to secure a rehab facility in [place].  Today there is no talk of Hospice amongst the family.   Assessment: Patient feeling much improved.  Remains on milrinone.  Family now requesting rehab in [place].   Patient Profile/HPI:  85 y.o.femalewith past medical history of NICM, systolic CHF EF <54%, HTN, HLD, afib (on Xarelto), s/p Medtronic CRT-D, CKD stage 3admitted on 11/22/2021with increased shortness of breath related to CHF exacerbation and worsening renal function.Hospitalization complicated by hemoptysis and has required intubation. Also requiring milrinone infusion.      Length of Stay: 12   Vital Signs: BP 119/66 (BP Location: Right Arm)   Pulse 82   Temp 98 F (36.7 C) (Oral)   Resp 20   Ht 5\' 5"  (1.651 m)   Wt 58.9 kg   SpO2 95%   BMI 21.61 kg/m  SpO2: SpO2: 95 % O2 Device: O2 Device: Nasal Cannula O2  Flow Rate: O2 Flow Rate (L/min): 3 L/min       Palliative Assessment/Data:  50% with milrinone.     Palliative Care Plan    Recommendations/Plan: TOC request to assist with finding SNF in [place] that will accept milrinone.  (Do SNFs accept milrinone?) Please at least request Palliative to follow at SNF.  Am concerned for rapid decline particularly when milrinone is DC'd. Family knows that Hospice at home is available to  them with or without milrinone.   At this point they want to try SNF in [place].  Code Status:  DNR  Prognosis:  < 3 months secondary to advanced heart failure and valvular disease.   Discharge Planning: [place] for rehab with Palliative care service follow-up.   If this is not an option family is aware that Hospice services in the home are available to them.  Care plan was discussed with patient and family.  Thank you for allowing the Palliative Medicine Team to assist in the care of this patient.  Total time spent:  25 min.     Greater than 50%  of this time was spent counseling and coordinating care related to the above assessment and plan.  [name], PA-C Palliative Medicine  Please contact Palliative MedicineTeam phone at [phone] for questions and concerns between 7 am - 7 pm.   Please see AMION for individual provider pager numbers.

## 2020-08-15 DIAGNOSIS — I5043 Acute on chronic combined systolic (congestive) and diastolic (congestive) heart failure: Secondary | ICD-10-CM | POA: Diagnosis not present

## 2020-08-15 LAB — BASIC METABOLIC PANEL
Anion gap: 11 (ref 5–15)
BUN: 15 mg/dL (ref 8–23)
CO2: 21 mmol/L — ABNORMAL LOW (ref 22–32)
Calcium: 8.4 mg/dL — ABNORMAL LOW (ref 8.9–10.3)
Chloride: 103 mmol/L (ref 98–111)
Creatinine, Ser: 1.02 mg/dL — ABNORMAL HIGH (ref 0.44–1.00)
GFR, Estimated: 55 mL/min — ABNORMAL LOW (ref >60)
Glucose, Bld: 93 mg/dL (ref 70–99)
Potassium: 4.2 mmol/L (ref 3.5–5.1)
Sodium: 135 mmol/L (ref 135–145)

## 2020-08-15 LAB — COOXEMETRY PANEL
Carboxyhemoglobin: 1.4 % (ref 0.5–1.5)
Methemoglobin: 0.7 % (ref 0.0–1.5)
O2 Saturation: 55.6 %
Total hemoglobin: 11.9 g/dL — ABNORMAL LOW (ref 12.0–16.0)

## 2020-08-15 MED ORDER — CALCIUM CARBONATE ANTACID 500 MG PO CHEW
1.0000 | CHEWABLE_TABLET | Freq: Three times a day (TID) | ORAL | Status: DC
  Administered 2020-08-16 – 2020-08-18 (×8): 200 mg via ORAL
  Filled 2020-08-15 (×8): qty 1

## 2020-08-15 NOTE — Progress Notes (Signed)
Medication administration, charting, and patient care documented by Student RN supervised by this RN 

## 2020-08-15 NOTE — Progress Notes (Addendum)
Patient ID: Cheryl Vargas, female   DOB: 24-Nov-1934, 85 y.o.   MRN: [id number]       Advanced Heart Failure Rounding Note  PCP-Cardiologist: [name], MD   Subjective:    Had localized pulmonary artery perforation during cath on 11/24. Intubated and underwent successful coil embolization with IR. Had VT x 1 during cath and shocked.  Extubated 11/25.  No further hemoptysis.   Remains on milrinone 0.25 mcg, co-ox 56%.  Remains on midodrine 10 tid.  CVP stable at 6. Weight down 1 pound   Remains on milrinone. Doesn't feel good today. Run down. Felt better yesterday. Denies SOB, orthopnea or PND  - ECHO EF 20-25% RV normal, biatrial enlargement, IVC normal.   - RHC 11/24  Procedural Findings (incomplete study due to emergency): Hemodynamics (mmHg) RA mean 6 PA 41/16, mean 26 PCWP mean 19 Oxygen saturations: PA 57% AO 93% Cardiac Output (Fick) 2.92  Cardiac Index (Fick) 1.8    Objective:   Weight Range: 58.1 kg Body mass index is 21.31 kg/m.   Vital Signs:   Temp:  [97.5 F (36.4 C)-98.1 F (36.7 C)] 98 F (36.7 C) (12/05 1200) Pulse Rate:  [79-82] 80 (12/05 1200) Resp:  [15-21] 20 (12/05 1200) BP: (104-124)/(56-73) 120/68 (12/05 1200) SpO2:  [92 %-97 %] 96 % (12/05 1200) Weight:  [58.1 kg] 58.1 kg (12/05 0655) Last BM Date: 08/13/20  Weight change: Filed Weights   08/13/20 0426 08/14/20 0500 08/15/20 0655  Weight: 57.3 kg 58.9 kg 58.1 kg    Intake/Output:   Intake/Output Summary (Last 24 hours) at 08/15/2020 1501 Last data filed at 08/15/2020 0500 Gross per 24 hour  Intake 293.76 ml  Output 900 ml  Net -606.24 ml      Physical Exam   General:  Elderly frail No resp difficulty HEENT: normal Neck: supple. no JVD. Carotids 2+ bilat; no bruits. No lymphadenopathy or thryomegaly appreciated. Cor: PMI nondisplaced. Regular rate & rhythm. No rubs, gallops or murmurs. Lungs: clear Abdomen: soft, nontender, nondistended. No hepatosplenomegaly. No bruits  or masses. Good bowel sounds. Extremities: no cyanosis, clubbing, rash, edema Neuro: alert & orientedx3, cranial nerves grossly intact. moves all 4 extremities w/o difficulty. Affect pleasant   Telemetry   BiV paced 80s underlying AF Personally reviewed   Labs    CBC No results for input(s): WBC, NEUTROABS, HGB, HCT, MCV, PLT in the last 72 hours. Basic Metabolic Panel Recent Labs    08/14/20 0448 08/15/20 0152  NA 135 135  K 4.1 4.2  CL 105 103  CO2 23 21*  GLUCOSE 89 93  BUN 14 15  CREATININE 1.04* 1.02*  CALCIUM 7.9* 8.4*   Liver Function Tests No results for input(s): AST, ALT, ALKPHOS, BILITOT, PROT, ALBUMIN in the last 72 hours. No results for input(s): LIPASE, AMYLASE in the last 72 hours. Cardiac Enzymes No results for input(s): CKTOTAL, CKMB, CKMBINDEX, TROPONINI in the last 72 hours.  BNP: BNP (last 3 results) Recent Labs    09/27/19 1708 08/02/20 0832  BNP 1,032.7* 1,285.0*    ProBNP (last 3 results) No results for input(s): PROBNP in the last 8760 hours.   D-Dimer No results for input(s): DDIMER in the last 72 hours. Hemoglobin A1C No results for input(s): HGBA1C in the last 72 hours. Fasting Lipid Panel No results for input(s): CHOL, HDL, LDLCALC, TRIG, CHOLHDL, LDLDIRECT in the last 72 hours. Thyroid Function Tests No results for input(s): TSH, T4TOTAL, T3FREE, THYROIDAB in the last 72 hours.  Invalid  input(s): FREET3  Other results:   Imaging    No results found.   Medications:     Scheduled Medications: . acetaminophen  1,000 mg Oral Q8H  . amiodarone  200 mg Oral Daily  . calcium carbonate  1 tablet Oral TID WC  . Chlorhexidine Gluconate Cloth  6 each Topical Daily  . docusate sodium  100 mg Oral BID  . furosemide  20 mg Oral Daily  . lidocaine  1 patch Transdermal Q24H  . melatonin  6 mg Oral QHS  . midodrine  10 mg Oral TID WC  . polyethylene glycol  17 g Oral Daily  . rivaroxaban  15 mg Oral Q supper     Infusions: . milrinone 0.25 mcg/kg/min (08/15/20 0837)    PRN Medications: lidocaine-EPINEPHrine, LORazepam, morphine injection, phenol, sodium chloride flush, zolpidem    Patient Profile   Cheryl Vargas is an 85 year old with a history ofHTN, HLD, PAF (On Xarelto), and nonischemic cardiomyopathy with chronic systolic CHF. Cardiomyopathy dates back to at least 2014. Most recent ECHO 09/2019 showed EF < 20%.  Intolerant ranolazine, losartan,spironolactone, amiodarone, sotolol, and tikosyn.  Admitted with presyncope/volume overload.   Assessment/Plan   1. Hemoptysis: Due to small, isolated right PA branch rupture on 11/24. She was intubated and underwent bronchoscopy with clearing of blood. Suspect bleeding contained quickly. S/p coiling PA branch in IR 11/24.  Hgb stable.  Hemoptysis resolved.  - Back on Xarelto. No change. Hgb 11.9 on co-ox 2. Acute on chronic systolic HF: [place] 10/9526 showed no significant coronary disease. Cardiac MRI 10/17 EF 36%, diffuse HK, normal RV, biatrial enlargement, moderate MR, non-coronary LGE pattern involving the mid-wall of the basal to mid septum and inferior wall. Possibly prior myocarditis versus a form of infiltrative disease. Medtronic CRT-D s/p AV nodal ablation. Echo in 1/21 showed EF <20% with moderate LV dilation and mildly decreased RV systolic function. Low output on 1/21 RHC (CI 1.72). With age and frailty, LVAD not thought to be good option. At last appointment, she was not BiV pacing at an ideal percentage, likely due to PVCs. Symptomatically worse, NYHA class IIIb since decreasing Lasix in setting of rising creatinine. Low output HF noted this admission as well, CI on RHC was 1.8.  She was on milrinone 0.125 prior to [place].  EP has seen, CRT device adjusted and QRS now narrower.  Echo this admission with EF 20-25%, RV low normal systolic function, severe MR and TR.  She diuresed well this admission, PCWP 19 on RHC.  As above, was  intubated with pulmonary hemorrhage post-RHC, now extubated and stable.  Off NE and on milrinone 0.25 currently.  She has end-stage CHF and is inotrope-dependent.  Co-ox 56%, CVP stable at 6. Back on low-dose po lasix 20 daily. Weight down 1 pound. K 4/2 - We have been trying to get her to rehab facility on milrinone to allow eventual discharge home on milrinone with [facility] hospice when mobility is improved, but have had multiple conversations regarding rehab beds and none are available due to [place] at [employer]. Her son stated that his mother's insurance with cover Acute Rehab out of network (in [place] where he lives) and they will accept milrinone.  He would like to work with [facility] to secure a rehab facility in [place]. He thinks logistics may take a few days to organize however.  - Continue Lasix 20 mg daily.  - Continue midodrine 10 mg tid.  -Off digoxin with persistently elevated level despite  cutting back to 0.0625 mg qod. - Concerned that frequent PVCs may be affecting function (and also limiting BiV pacing percentage) => Retrying her on amiodarone. Tolerating so far.  Would stop at discharge if home with hospice.  3. Permanent Atrial fibrillation: Chronic, s/p AV nodal ablation and BiV pacing.  -Continue xarelto daily.  Hgb stable  4. PVCs/VT: Frequent PVCs have limited BiV pacing. Not a good PVC ablation candidate per EP. Had VT in 1/21. Cannot tolerate amiodarone, sotolol, or Tikosyn. Did not tolerate mexilitine with nausea. Had nausea also with ranolazine. Additionally, did not tolerate further uptitration of bisoprolol.  VT during pulmonary hemorrhage episode in setting of low oxygen saturation.  - Continue po amiodarone (stop at discharge if home with hospice).  5. Mitral regurgitation: At least 3+ MR, suspect functional due to dilated annulus with severely dilated LA.  TR appears worse (clearly severe).  Discussed with Dr. Burt Knack, do not think that she would be a good  Mitraclip candidate as TR is clearly worse than MR.  6. AKI on CKD stage 3: Stable. Creatinine 1.02. K 4.2   7. Debility: PT/OT consult => recommend SNF. Needs to work with PT daily, out of bed as much as possible.   Her son stated that his mother's insurance with cover Acute Rehab out of network (in [place] where he lives) and they will accept milrinone.  He would like to work with [facility] to secure a rehab facility in [place]. He thinks logistics may take a few days to organize however.   Glori Bickers MD 08/15/2020 3:01 PM

## 2020-08-16 DIAGNOSIS — I5043 Acute on chronic combined systolic (congestive) and diastolic (congestive) heart failure: Secondary | ICD-10-CM | POA: Diagnosis not present

## 2020-08-16 LAB — COOXEMETRY PANEL
Carboxyhemoglobin: 1.4 % (ref 0.5–1.5)
Methemoglobin: 0.7 % (ref 0.0–1.5)
O2 Saturation: 58.7 %
Total hemoglobin: 12 g/dL (ref 12.0–16.0)

## 2020-08-16 LAB — BASIC METABOLIC PANEL
Anion gap: 8 (ref 5–15)
BUN: 16 mg/dL (ref 8–23)
CO2: 25 mmol/L (ref 22–32)
Calcium: 8.3 mg/dL — ABNORMAL LOW (ref 8.9–10.3)
Chloride: 102 mmol/L (ref 98–111)
Creatinine, Ser: 1.1 mg/dL — ABNORMAL HIGH (ref 0.44–1.00)
GFR, Estimated: 50 mL/min — ABNORMAL LOW (ref >60)
Glucose, Bld: 106 mg/dL — ABNORMAL HIGH (ref 70–99)
Potassium: 3.5 mmol/L (ref 3.5–5.1)
Sodium: 135 mmol/L (ref 135–145)

## 2020-08-16 NOTE — Progress Notes (Signed)
Family is actively working with TOC to determine disposition.    Patient is eligible for acute rehab.  Patient is eligible for hospice services in her home with 24 hour care provided by family and privately hired care givers.     PMT supports whichever option patient chooses, but recommended home with hospice.  PMT will remain available to family to answer questions and offer support.  We will follow from a distance.  If patient declines or we need to be more actively engaged for any reason please do not hesitate to call our office.  [name], PA-C Palliative Medicine Office:  [phone]

## 2020-08-16 NOTE — Progress Notes (Signed)
Physical Therapy Treatment Patient Details Name: Cheryl Vargas MRN: [id number] DOB: 03-Mar-1935 Today's Date: 08/16/2020    History of Present Illness Pt is an 85 year old woman admtted on 08/04/20 with acute on chronic heart failure. Hospital course complicated by sudden massive hemoptysis during cardiac catherization on 11/24 requiring emergent intubation and bronchoscopy. Extubated 11/25. PMH: NICM with EF 20%, HTN, HLD, PAF.    PT Comments    Patient received in bed, agrees to PT session. Reports she is feeling pretty well, slept well. Patient performed bed mobility with mod independence, transfers with min guard. Ambulated 125 feet with RW and min guard. +2 assist for lines only. Patient ambulated in halls on room air and saturations remained > 93% throughout session. She was left on room air, nursing notified. HR within normal limits through session. Patient progressing well towards goals. She will continue to benefit from skilled PT while here to improve strength and functional independence for safe return home.      Follow Up Recommendations  Home health PT;Supervision/Assistance - 24 hour     Equipment Recommendations  None recommended by PT    Recommendations for Other Services       Precautions / Restrictions Precautions Precautions: Fall Restrictions Weight Bearing Restrictions: No    Mobility  Bed Mobility Overal bed mobility: Modified Independent Bed Mobility: Supine to Sit     Supine to sit: Modified independent (Device/Increase time)        Transfers Overall transfer level: Needs assistance Equipment used: Rolling walker (2 wheeled) Transfers: Sit to/from Stand Sit to Stand: Min guard            Ambulation/Gait Ambulation/Gait assistance: Min guard Gait Distance (Feet): 125 Feet   Gait Pattern/deviations: Step-through pattern Gait velocity: decreased   General Gait Details: HR and O2 sats WNL throughout session. Ambulated on room air with  saturations never below 93%, min assist to manage walker due to pulling to right.   Stairs             Wheelchair Mobility    Modified Rankin (Stroke Patients Only)       Balance Overall balance assessment: Needs assistance Sitting-balance support: Feet supported Sitting balance-Leahy Scale: Good     Standing balance support: Bilateral upper extremity supported;During functional activity Standing balance-Leahy Scale: Fair Standing balance comment: benefits from RW and min guard                            Cognition Arousal/Alertness: Awake/alert Behavior During Therapy: WFL for tasks assessed/performed Overall Cognitive Status: Within Functional Limits for tasks assessed                                        Exercises      General Comments        Pertinent Vitals/Pain Pain Assessment: No/denies pain    Home Living                      Prior Function            PT Goals (current goals can now be found in the care plan section) Acute Rehab PT Goals Patient Stated Goal: get moving PT Goal Formulation: With patient Time For Goal Achievement: 08/20/20 Potential to Achieve Goals: Good Progress towards PT goals: Progressing toward goals    Frequency  Min 3X/week      PT Plan Discharge plan needs to be updated    Co-evaluation              AM-PAC PT "6 Clicks" Mobility   Outcome Measure  Help needed turning from your back to your side while in a flat bed without using bedrails?: None Help needed moving from lying on your back to sitting on the side of a flat bed without using bedrails?: None Help needed moving to and from a bed to a chair (including a wheelchair)?: A Little Help needed standing up from a chair using your arms (e.g., wheelchair or bedside chair)?: A Little Help needed to walk in hospital room?: A Little Help needed climbing 3-5 steps with a railing? : A Lot 6 Click Score: 19    End  of Session Equipment Utilized During Treatment: Gait belt Activity Tolerance: Patient tolerated treatment well Patient left: in chair;with call bell/phone within reach Nurse Communication: Mobility status PT Visit Diagnosis: Muscle weakness (generalized) (M62.81);Unsteadiness on feet (R26.81)     Time: 1033-1050 PT Time Calculation (min) (ACUTE ONLY): 17 min  Charges:  $Gait Training: 8-22 mins                     Aubrea Meixner, PT, GCS 08/16/20,11:17 AM

## 2020-08-16 NOTE — TOC Progression Note (Addendum)
Transition of Care [facility]) - Progression Note    Patient Details  Name: Cheryl Vargas MRN: [id number] Date of Birth: October 16, 1934  Transition of Care [facility]) CM/SW [place], [place] Phone Number: 08/16/2020, 10:15 AM  Clinical Narrative:     CSW called pt son [name] after RN notified CSW of family wanting facility in [place]. Son explains that there is a facility in [place] that may take pt with IV Milrinone but that would be a last resort option. Son explained that MD planned to change IV Milrinone to be changed 1x/week and that family would be willing to private pay for an RN to change Milrinone 1x/week at SNF in hopes that it would increase options for SNF. CSW to reach out to SNF facilities.   [place] unable to accept [facility] unable to accept [facility] unable to accept  [facility] unable to accept  Facilities would need RN on staff at 24/7, not just during administration.    1246: Spoke with son [name]. Explained that facilities would need RN on staff 24/7 and also that no facilities are able to have an outside RN staff come to administer medication. CSW inquired about facilities mentioned in [place]. Son explained that he had 4 facilities in [place] in mind but none of them have been contacted yet regarding IV Milrinone. [name] plans to talk to his other brother to discuss pt going home versus trying SNF in [place]. [name] will call CSW back around 1400. He also seeks [name] as an option. CSW expanded SNF search to [facility].     Expected Discharge Plan: [place] Barriers to Discharge: No Barriers Identified  Expected Discharge Plan and Services Expected Discharge Plan: [place] In-house Referral: Clinical Social Work   Post Acute Care Choice: [place] Living arrangements for the past 2 months: [place]                                       Social Determinants of Health  (SDOH) Interventions    Readmission Risk Interventions No flowsheet data found.

## 2020-08-16 NOTE — Progress Notes (Signed)
Patient ID: Cheryl Vargas, female   DOB: 22-Jul-1935, 85 y.o.   MRN: [id number]       Advanced Heart Failure Rounding Note  PCP-Cardiologist: [name], MD   Subjective:    Had localized pulmonary artery perforation during cath on 11/24. Intubated and underwent successful coil embolization with IR. Had VT x 1 during cath and shocked.  Extubated 11/25.  No further hemoptysis.   Remains on milrinone 0.25 mcg, co-ox 59%.  Remains on midodrine 10 tid.  CVP not elevated.    Says she feels better today than yesterday, had bad day yesterday.   - ECHO EF 20-25% RV normal, biatrial enlargement, IVC normal.   - RHC 11/24  Procedural Findings (incomplete study due to emergency): Hemodynamics (mmHg) RA mean 6 PA 41/16, mean 26 PCWP mean 19 Oxygen saturations: PA 57% AO 93% Cardiac Output (Fick) 2.92  Cardiac Index (Fick) 1.8    Objective:   Weight Range: 57.2 kg Body mass index is 21 kg/m.   Vital Signs:   Temp:  [97.7 F (36.5 C)-98.4 F (36.9 C)] 98 F (36.7 C) (12/06 0735) Pulse Rate:  [80-82] 82 (12/06 0735) Resp:  [16-24] 16 (12/06 0839) BP: (103-126)/(59-69) 103/62 (12/06 0839) SpO2:  [92 %-98 %] 98 % (12/06 0839) Weight:  [57.2 kg] 57.2 kg (12/06 0349) Last BM Date: 08/15/20  Weight change: Filed Weights   08/14/20 0500 08/15/20 0655 08/16/20 0349  Weight: 58.9 kg 58.1 kg 57.2 kg    Intake/Output:   Intake/Output Summary (Last 24 hours) at 08/16/2020 0927 Last data filed at 08/16/2020 0845 Gross per 24 hour  Intake 1000 ml  Output 1350 ml  Net -350 ml      Physical Exam   General: NAD, frail.  Neck: No JVD, no thyromegaly or thyroid nodule.  Lungs: Clear to auscultation bilaterally with normal respiratory effort. CV: Lateral PMI.  Heart regular S1/S2, no S3/S4, 2/6 HSM apex.  No peripheral edema.   Abdomen: Soft, nontender, no hepatosplenomegaly, no distention.  Skin: Intact without lesions or rashes.  Neurologic: Alert and oriented x 3.  Psych:  Normal affect. Extremities: No clubbing or cyanosis.  HEENT: Normal.    Telemetry   BiV paced 80s underlying AF Personally reviewed   Labs    CBC No results for input(s): WBC, NEUTROABS, HGB, HCT, MCV, PLT in the last 72 hours. Basic Metabolic Panel Recent Labs    08/15/20 0152 08/16/20 0535  NA 135 135  K 4.2 3.5  CL 103 102  CO2 21* 25  GLUCOSE 93 106*  BUN 15 16  CREATININE 1.02* 1.10*  CALCIUM 8.4* 8.3*   Liver Function Tests No results for input(s): AST, ALT, ALKPHOS, BILITOT, PROT, ALBUMIN in the last 72 hours. No results for input(s): LIPASE, AMYLASE in the last 72 hours. Cardiac Enzymes No results for input(s): CKTOTAL, CKMB, CKMBINDEX, TROPONINI in the last 72 hours.  BNP: BNP (last 3 results) Recent Labs    09/27/19 1708 08/02/20 0832  BNP 1,032.7* 1,285.0*    ProBNP (last 3 results) No results for input(s): PROBNP in the last 8760 hours.   D-Dimer No results for input(s): DDIMER in the last 72 hours. Hemoglobin A1C No results for input(s): HGBA1C in the last 72 hours. Fasting Lipid Panel No results for input(s): CHOL, HDL, LDLCALC, TRIG, CHOLHDL, LDLDIRECT in the last 72 hours. Thyroid Function Tests No results for input(s): TSH, T4TOTAL, T3FREE, THYROIDAB in the last 72 hours.  Invalid input(s): FREET3  Other results:  Imaging    No results found.   Medications:     Scheduled Medications: . acetaminophen  1,000 mg Oral Q8H  . amiodarone  200 mg Oral Daily  . calcium carbonate  1 tablet Oral TID WC  . Chlorhexidine Gluconate Cloth  6 each Topical Daily  . docusate sodium  100 mg Oral BID  . furosemide  20 mg Oral Daily  . lidocaine  1 patch Transdermal Q24H  . melatonin  6 mg Oral QHS  . midodrine  10 mg Oral TID WC  . polyethylene glycol  17 g Oral Daily  . rivaroxaban  15 mg Oral Q supper    Infusions: . milrinone 0.25 mcg/kg/min (08/16/20 0841)    PRN Medications: lidocaine-EPINEPHrine, LORazepam, morphine  injection, phenol, sodium chloride flush, zolpidem    Patient Profile   Cheryl Vargas is an 85 year old with a history ofHTN, HLD, PAF (On Xarelto), and nonischemic cardiomyopathy with chronic systolic CHF. Cardiomyopathy dates back to at least 2014. Most recent ECHO 09/2019 showed EF < 20%.  Intolerant ranolazine, losartan,spironolactone, amiodarone, sotolol, and tikosyn.  Admitted with presyncope/volume overload.   Assessment/Plan   1. Hemoptysis: Due to small, isolated right PA branch rupture on 11/24. She was intubated and underwent bronchoscopy with clearing of blood. Suspect bleeding contained quickly. S/p coiling PA branch in IR 11/24.  Hgb stable, 12 on co-ox.  Hemoptysis resolved.  - Back on Xarelto.  2. Acute on chronic systolic HF: [place] 01/92 showed no significant coronary disease. Cardiac MRI 10/17 EF 36%, diffuse HK, normal RV, biatrial enlargement, moderate MR, non-coronary LGE pattern involving the mid-wall of the basal to mid septum and inferior wall. Possibly prior myocarditis versus a form of infiltrative disease. Medtronic CRT-D s/p AV nodal ablation. Echo in 1/21 showed EF <20% with moderate LV dilation and mildly decreased RV systolic function. Low output on 1/21 RHC (CI 1.72). With age and frailty, LVAD not thought to be good option. At last appointment, she was not BiV pacing at an ideal percentage, likely due to PVCs. Symptomatically worse, NYHA class IIIb since decreasing Lasix in setting of rising creatinine. Low output HF noted this admission as well, CI on RHC was 1.8.  She was on milrinone 0.125 prior to [place] [address].  EP has seen, CRT device adjusted and QRS now narrower.  Echo this admission with EF 20-25%, RV low normal systolic function, severe MR and TR.  She diuresed well this admission, PCWP 19 on RHC.  As above, was intubated with pulmonary hemorrhage post-RHC, now extubated and stable.  Off NE and on milrinone 0.25 currently.  She has end-stage CHF and is  inotrope-dependent.  Co-ox 59%, CVP not elevated. Back on low-dose po lasix 20 daily.  - We have been trying to get her to rehab facility on milrinone to allow eventual discharge home on milrinone with [facility] hosp[facility] when mobility is improved, but have had multiple conversations regarding rehab beds and none are available due to [place] [facility] [employer]. Her son stated that his mother's insurance with cover Acute Rehab out of network (in [place] where he lives) and they will accept milrinone.  He would like to work with [facility] to secure a rehab facility in [place]. He thinks logistics may take a few days to organize however.  - Continue Lasix 20 mg daily.  - Continue midodrine 10 mg tid.  -Off digoxin with persistently elevated level despite cutting back to 0.0625 mg qod. - Concerned that frequent PVCs may be affecting function (  and also limiting BiV pacing percentage) => Retrying her on amiodarone. Tolerating so far.  Would stop at discharge if home with hospice.  3. Permanent Atrial fibrillation: Chronic, s/p AV nodal ablation and BiV pacing. Hgb stable.  - Continue xarelto daily.    4. PVCs/VT: Frequent PVCs have limited BiV pacing. Not a good PVC ablation candidate per EP. Had VT in 1/21. Cannot tolerate amiodarone, sotolol, or Tikosyn. Did not tolerate mexilitine with nausea. Had nausea also with ranolazine. Additionally, did not tolerate further uptitration of bisoprolol.  VT during pulmonary hemorrhage episode in setting of low oxygen saturation.  - Continue po amiodarone (stop at discharge if home with hospice).  5. Mitral regurgitation: At least 3+ MR, suspect functional due to dilated annulus with severely dilated LA.  TR appears worse (clearly severe).  Discussed with Dr. Burt Knack, do not think that she would be a good Mitraclip candidate as TR is clearly worse than MR.  6. AKI on CKD stage 3: Stable.  7. Debility: PT/OT consult => recommend SNF. Needs to work with PT daily, out of  bed as much as possible.   Her son stated that his mother's insurance with cover Acute Rehab out of network (in [place] where he lives) and they will accept milrinone.  He would like to work with [facility] to secure a rehab facility in [place]. He thinks logistics may take a few days to organize however.   [name] MD 08/16/2020 9:27 AM

## 2020-08-16 NOTE — Progress Notes (Signed)
Occupational Therapy Treatment Patient Details Name: Cheryl Vargas MRN: [id number] DOB: 1935-09-01 Today's Date: 08/16/2020    History of present illness Pt is an 85 year old woman admitted on 08/04/20 with acute on chronic heart failure. Hospital course complicated by sudden massive hemoptysis during cardiac catherization on 11/24 requiring emergent intubation and bronchoscopy. Extubated 11/25. PMH: NICM with EF 20%, HTN, HLD, PAF.   OT comments  Pt ambulated to bathroom and performed standing grooming at sink with min guard assist. Pt with Sp02> 92% on RA with HR 83. Pt's cognition WNL. Began educating pt in energy conservation strategies. Pt progressing well. Updated d/c recommendation to [facility], but only if family can arrange for 24 hour care initially.   Follow Up Recommendations  Home health OT;Supervision/Assistance - 24 hour (SNF if family cannot provide 24 hour care)    Equipment Recommendations  3 in 1 bedside commode    Recommendations for Other Services      Precautions / Restrictions Precautions Precautions: Fall Precaution Comments: watch HR and sats       Mobility Bed Mobility Overal bed mobility: Modified Independent                Transfers Overall transfer level: Needs assistance Equipment used: Rolling walker (2 wheeled) Transfers: Sit to/from Stand Sit to Stand: Min guard         General transfer comment: no physical assist, but pt unsteady initially    Balance Overall balance assessment: Needs assistance Sitting-balance support: Feet supported Sitting balance-Leahy Scale: Good       Standing balance-Leahy Scale: Fair Standing balance comment: can release walker in static standing to wash hands, manage clothing with toileting                           ADL either performed or assessed with clinical judgement   ADL Overall ADL's : Needs assistance/impaired     Grooming: Wash/dry hands;Standing;Min guard           Upper  Body Dressing : Minimal assistance;Sitting       Toilet Transfer: Min guard;Ambulation;RW;BSC   Toileting- Water quality scientist and Hygiene: Min guard;Sit to/from stand       Functional mobility during ADLs: Passenger transport manager     Praxis      Cognition Arousal/Alertness: Awake/alert Behavior During Therapy: WFL for tasks assessed/performed Overall Cognitive Status: Within Functional Limits for tasks assessed                                          Exercises     Shoulder Instructions       General Comments      Pertinent Vitals/ Pain       Pain Assessment: No/denies pain  Home Living                                          Prior Functioning/Environment              Frequency  Min 2X/week        Progress Toward Goals  OT Goals(current goals can now be found in the care plan section)  Progress towards OT goals: Progressing toward  goals  Acute Rehab OT Goals Patient Stated Goal: get moving OT Goal Formulation: With patient/family Time For Goal Achievement: 08/20/20 Potential to Achieve Goals: [place]  Plan Discharge plan needs to be updated    Co-evaluation                 AM-PAC OT "6 Clicks" Daily Activity     Outcome Measure   Help from another person eating meals?: None Help from another person taking care of personal grooming?: A Little Help from another person toileting, which includes using toliet, bedpan, or urinal?: A Little Help from another person bathing (including washing, rinsing, drying)?: A Lot Help from another person to put on and taking off regular upper body clothing?: A Little Help from another person to put on and taking off regular lower body clothing?: A Lot 6 Click Score: 17    End of Session Equipment Utilized During Treatment: Gait belt;Rolling walker  OT Visit Diagnosis: Unsteadiness on feet (R26.81);Other abnormalities of gait and  mobility (R26.89);Muscle weakness (generalized) (M62.81);Other (comment)   Activity Tolerance Patient tolerated treatment well   Patient Left in bed;with call bell/phone within reach;with bed alarm set (declined remaining up in chair)   Nurse Communication          Time: 6195-0932 OT Time Calculation (min): 14 min  Charges: OT General Charges $OT Visit: 1 Visit OT Treatments $Self Care/Home Management : 8-22 mins   Malka So 08/16/2020, 3:21 PM  Nestor Lewandowsky, OTR/L Acute Rehabilitation Services Pager: [phone] Office: [phone]

## 2020-08-17 ENCOUNTER — Encounter (HOSPITAL_COMMUNITY): Payer: PPO | Admitting: Cardiology

## 2020-08-17 ENCOUNTER — Telehealth (HOSPITAL_COMMUNITY): Payer: Self-pay | Admitting: Licensed Clinical Social Worker

## 2020-08-17 DIAGNOSIS — I5043 Acute on chronic combined systolic (congestive) and diastolic (congestive) heart failure: Secondary | ICD-10-CM | POA: Diagnosis not present

## 2020-08-17 LAB — BASIC METABOLIC PANEL
Anion gap: 11 (ref 5–15)
BUN: 16 mg/dL (ref 8–23)
CO2: 24 mmol/L (ref 22–32)
Calcium: 8.4 mg/dL — ABNORMAL LOW (ref 8.9–10.3)
Chloride: 101 mmol/L (ref 98–111)
Creatinine, Ser: 1.05 mg/dL — ABNORMAL HIGH (ref 0.44–1.00)
GFR, Estimated: 53 mL/min — ABNORMAL LOW (ref >60)
Glucose, Bld: 97 mg/dL (ref 70–99)
Potassium: 3.5 mmol/L (ref 3.5–5.1)
Sodium: 136 mmol/L (ref 135–145)

## 2020-08-17 LAB — COOXEMETRY PANEL
Carboxyhemoglobin: 1.4 % (ref 0.5–1.5)
Methemoglobin: 0.9 % (ref 0.0–1.5)
O2 Saturation: 50.2 %
Total hemoglobin: 12.2 g/dL (ref 12.0–16.0)

## 2020-08-17 LAB — MAGNESIUM: Magnesium: 1.8 mg/dL (ref 1.7–2.4)

## 2020-08-17 MED ORDER — SENNA 8.6 MG PO TABS
1.0000 | ORAL_TABLET | Freq: Once | ORAL | Status: DC
  Filled 2020-08-17: qty 1

## 2020-08-17 MED ORDER — POTASSIUM CHLORIDE CRYS ER 20 MEQ PO TBCR
20.0000 meq | EXTENDED_RELEASE_TABLET | Freq: Every day | ORAL | Status: DC
  Administered 2020-08-17: 20 meq via ORAL
  Filled 2020-08-17: qty 1

## 2020-08-17 NOTE — Care Management Important Message (Signed)
Important Message  Patient Details  Name: Cheryl Vargas MRN: [id number] Date of Birth: 1935/01/21   Medicare Important Message Given:  Yes     [name] 08/17/2020, 9:43 AM

## 2020-08-17 NOTE — Progress Notes (Signed)
PT Cancellation Note  Patient Details Name: Cheryl Vargas MRN: [id number] DOB: 11-Dec-1934   Cancelled Treatment:    Reason Eval/Treat Not Completed: (P) Patient declined, no reason specified (Pt reports feeling tired and having a rough night.  Pt reports she has just taken ativan and wished to rest.  Pt reported to PTA to return around 2pm.  Will f/u per POC.)   Mersedes Alber Eli Hose 08/17/2020, 10:44 AM  Erasmo Leventhal , PTA Acute Rehabilitation Services Pager [phone] Office [phone]

## 2020-08-17 NOTE — TOC Progression Note (Signed)
Transition of Care (TOC) - Progression Note   CSW spoke with pt son [name] and discuss plans. Family wants to try 1 more SNF, [facility]. If [facility] cannot accept pt the plan will be for pt to return to her home with [facility], Infusion services, DME, and palliative. Son states family prefers [facility] HH and [facility] for Palliative services. Son is okay with [facility]. Son also requests that pt is able to receive a "Junior rolling walker."  CSW spoke with Loi at [facility]. They are unable to accept pt on Milrinone. CSW notified son [name].   CSW spoke with [name] w/ advance infusion. She will arrange Milrinone today with plan to d/c tomorrow.   NCM will arrange HH, palliative, and DME.   Patient Details  Name: Cheryl Vargas MRN: [id number] Date of Birth: 05-19-1935  Transition of Care [facility]) CM/SW [place], [place] Phone Number: 08/17/2020, 12:17 PM  Clinical Narrative:       Expected Discharge Plan: [place] Barriers to Discharge: No Barriers Identified  Expected Discharge Plan and Services Expected Discharge Plan: [place] In-house Referral: Clinical Social Work   Post Acute Care Choice: [place] Living arrangements for the past 2 months: [place]                                       Social Determinants of Health (SDOH) Interventions    Readmission Risk Interventions No flowsheet data found.

## 2020-08-17 NOTE — Progress Notes (Signed)
   08/17/20 1020  Mobility  Activity Refused mobility (Pt declined for unspecified reasons)

## 2020-08-17 NOTE — Progress Notes (Signed)
PT Cancellation Note  Patient Details Name: Cheryl Vargas MRN: [id number] DOB: 12-01-1934   Cancelled Treatment:    Reason Eval/Treat Not Completed: (P) Patient declined, no reason specified (Pt continues to decline, "I can't do it today.  I'm nauseous, cold and tired."  PTA to follow up per POC.)   Parminder Trapani Eli Hose 08/17/2020, 2:03 PM  Erasmo Leventhal , PTA Acute Rehabilitation Services Pager [phone] Office [phone]

## 2020-08-17 NOTE — Telephone Encounter (Signed)
CSW spoke with son [name] who shared discharge plans for patient. Son stated that he preferred SNF option but understands due to milrinone that there are limited options. Son states he will have a 24/7 caregiver stay with patient post discharge with [facility] services and homecare agency for milrinone. Son also requesting a rolling walker for patient for home. Son very supportive of patient and creating a safe discharge plan for his mom. He is from [place] and plans to come and stay with patient early next week to provide additional support. CSW provided supportive intervention and listening as he shared about his healthcare experience and hopes for patient to recover at home. CSW spoke with inpatient [facility] Large, LCSW who was able to confirm plan and discharge schedule for tomorrow. CSW will follow patient in the outpatient setting for an additional support and needs. Raquel Sarna, [place], [place]

## 2020-08-17 NOTE — Discharge Summary (Signed)
Advanced Heart Failure Team  Discharge Summary   Patient ID: Cheryl Vargas MRN: [id number], DOB/AGE: 12/18/1934 85 y.o. Admit date: 08/02/2020 D/C date:     08/18/2020   Primary Discharge Diagnoses:  1. Hemoptysis 2.Acute on chronic systolic HF 3. Permanent Atrial fibrillation: Chronic, s/p AV nodal ablation and BiV pacing. 4. PVCs/VT 5. Mitral regurgitation 6. AKI on CKD stage 3 7. Debility:   Hospital Course:  Cheryl Vargas is an 85 year old with a history ofHTN, HLD, PAF (On Xarelto), and nonischemic cardiomyopathy with chronic systolic CHF. Cardiomyopathy dates back to at least 2014. Intolerant ranolazine, losartan,spironolactone, amiodarone, sotolol, and tikosyn  Followed closely in the HF clinic and was last seen 07/06/20.She had beenhaving ongoing episodesof presyncope. At that time she was started on Jardiance 10 mg dailyand digoxin was stopped due to elevated level. Medtronic Interrogation showed 80% BiV pacing and A fib. Weight at that time was 125 pounds. On 07/30/20 she was called and instructed to hold lasix a day then cut back to 20 mg daily. Creatinine had gone up from 1.1>1.7.  She has been having ongoing presyncope that was associated with increasedshortness of breath. Presented [facility] ED with increased shortness of breath. Started on IV lasix. Due to concern for low output heart failure she had RHC that was complicated by isolated rupture of PA branch. CCM urgently consulted for intubation and bronchoscopy. IR consulted and performed coiling to PA branch on 10/05/19. RHC did show low output so milrinone was started.   Anticoagulants held for a few days then restarted. As she improved she was extubated. Unable to wean milrinone and plan to continue milrinone for palliative purposes. Palliative Care with her / family for goals of care. She elected DNR.   Disposition: Plan to d/c home with [facility] for Palliative Care. For now she is holding off on  Hospice.  She will need ongoing PT/OT at home. Discharging with tunneled PICC in place and milrinone 0.125 mcg.    See below for detailed problem list. She will contine to be followed closely in the HF clinic. If she changes to Hospice will stop amiodarone.   1. Hemoptysis: Due to small, isolated right PA branch rupture on 11/24. She was intubated and underwent bronchoscopy with clearing of blood. Suspect bleeding contained quickly. S/p coiling PA branch in IR 11/24.  Xarelto held with bleeding but later restarted once hemoptysis stopped. Hgb stable, 12 on co-ox.   - Continue  Xarelto.  2.Acute on chronic systolic HF: [place] 02/3015 showed no significant coronary disease. Cardiac MRI 10/17 EF 36%, diffuse HK, normal RV, biatrial enlargement, moderate MR, non-coronary LGE pattern involving the mid-wall of the basal to mid septum and inferior wall. Possibly prior myocarditis versus a form of infiltrative disease. Medtronic CRT-D s/p AV nodal ablation. Echo in 1/21 showed EF <20% with moderate LV dilation and mildly decreased RV systolic function. Low output on 1/21 RHC (CI 1.72).With age and frailty, LVAD not thought to be good option. At last appointment, she wasnot BiV pacing at an ideal percentage, likely due to PVCs. Symptomatically worse, NYHA class IIIb since decreasing Lasix in setting of rising creatinine. Low output HF noted this admission as well, CI on RHC was 1.8. She was on milrinone 0.125 prior to [place].  EP has seen, CRT device adjusted and QRS now narrower.  Echo this admission with EF 20-25%, RV low normal systolic function, severe MR and TR.  She diuresed well this admission, PCWP  19 on RHC.  As above, was intubated with pulmonary hemorrhage post-RHC, later extubated once stabilized. NE weaned off. Continue milrinone 0.125 currently.  She has end-stage CHF and is inotrope-dependent.  Co-ox 59%, CVP not elevated.  Plan to continue lasix 40 mg po daily. Back on low-dose po lasix 20  daily.  - We have been trying to get her to rehab facility on milrinone to allow eventual discharge home on milrinone with [facility] when mobility is improved, but have had multiple conversations regarding rehab beds and none are available due to Malcolm at [employer]. Her son stated that his mother's insurance with cover Acute Rehab out of network (in [place] where he lives) and they will accept milrinone but they would like to try and take her home.  - Continue Lasix 40 mg daily and add KCl 20 daily.  - Continue midodrine 10 mg tid.  -Off digoxin with persistently elevated level. - Concerned that frequent PVCs may be affecting function (and also limiting BiV pacing percentage) => Retrying her on amiodarone.Tolerating so far so will continue.  3. Permanent Atrial fibrillation: Chronic, s/p AV nodal ablation and BiV pacing. Hgb stable.  - Continue xarelto daily.    4. PVCs/VT: Frequent PVCs have limited BiV pacing. Not a good PVC ablation candidate per EP. Had VT in 1/21. Cannot tolerate amiodarone, sotolol, or Tikosyn. Did not tolerate mexilitine with nausea. Had nausea also with ranolazine. Additionally, did not tolerate further uptitration of bisoprolol.  VT during pulmonary hemorrhage episode in setting of low oxygen saturation.  -Continue po amiodarone will continue for now. If she changes to hospice can stop.   5. Mitral regurgitation: At least 3+ MR, suspect functional due to dilated annulus with severely dilated LA.  TR appears worse (clearly severe). Discussed with Dr. Burt Knack, do not think that she would be a good Mitraclip candidate as TR is clearly worse than MR.  6. AKI on CKD stage 3: Stable.  7. Debility: PT/OT consult => recommend SNF but as above going home with home health.   Discharge Vitals: Blood pressure 116/66, pulse 80, temperature (!) 97.4 F (36.3 C), temperature source Oral, resp. rate 18, height 5\' 5"  (1.651 m), weight 58.1 kg, SpO2 96  %.  Labs: Lab Results  Component Value Date   WBC 7.1 08/11/2020   HGB 11.8 (L) 08/11/2020   HCT 34.5 (L) 08/11/2020   MCV 90.1 08/11/2020   PLT 192 08/11/2020    Recent Labs  Lab 08/18/20 0413  NA 136  K 3.8  CL 102  CO2 24  BUN 19  CREATININE 1.21*  CALCIUM 9.0  GLUCOSE 127*   Lab Results  Component Value Date   CHOL 176 12/17/2017   HDL 46 12/17/2017   LDLCALC 120 (H) 12/17/2017   TRIG 101 08/05/2020   BNP (last 3 results) Recent Labs    09/27/19 1708 08/02/20 0832  BNP 1,032.7* 1,285.0*    ProBNP (last 3 results) No results for input(s): PROBNP in the last 8760 hours.   Diagnostic Studies/Procedures   RHC 08/04/2020  RA mean 6 PA 41/16, mean 26 PCWP mean 19 Oxygen saturations: PA 57% AO 93% Cardiac Output (Fick) 2.92  Cardiac Index (Fick) 1.8  Echo 08/02/2020  1.EF 20-25% 2.  RV normal  3. Left atrial size was severely dilated.  4. Right atrial size was severely dilated.  5. The pericardial effusion is posterior to the left ventricle.  6. The mitral valve is abnormal. Severe mitral valve regurgitation.  7. The tricuspid valve is abnormal. Tricuspid valve regurgitation is  severe.  8. The aortic valve is abnormal. Aortic valve regurgitation is moderate.  Discharge Medications   Allergies as of 08/18/2020      Reactions   Sotalol Other (See Comments)   Prolonged QTc   Alendronate Sodium Other (See Comments)   Arm pain   Compazine [prochlorperazine]    Ciprofloxacin Other (See Comments)   Not effective   Coreg [carvedilol] Other (See Comments)   Fatigue   Metoprolol Other (See Comments)   Profound fatigue      Medication List    STOP taking these medications   bisoprolol 5 MG tablet Commonly known as: ZEBETA   empagliflozin 10 MG Tabs tablet Commonly known as: Jardiance     TAKE these medications   acetaminophen 500 MG tablet Commonly known as: TYLENOL Take 500 mg by mouth every 6 (six) hours as needed for headache.    amiodarone 200 MG tablet Commonly known as: PACERONE Take 1 tablet (200 mg total) by mouth daily.   calcium carbonate 500 MG chewable tablet Commonly known as: TUMS - dosed in mg elemental calcium Chew 1 tablet by mouth daily as needed for indigestion or heartburn.   fluticasone 50 MCG/ACT nasal spray Commonly known as: FLONASE Place 1 spray into both nostrils daily as needed for allergies or rhinitis.   furosemide 40 MG tablet Commonly known as: LASIX Take 1 tablet (40 mg total) by mouth daily. What changed:   medication strength  See the new instructions.   LORazepam 0.5 MG tablet Commonly known as: ATIVAN Take 1 tablet (0.5 mg total) by mouth daily as needed (nausea).   midodrine 10 MG tablet Commonly known as: PROAMATINE Take 1 tablet (10 mg total) by mouth 3 (three) times daily with meals.   milrinone 20 MG/100 ML Soln infusion Commonly known as: PRIMACOR Inject 0.0149 mg/min into the vein continuous. Per [facility]   ondansetron 4 MG tablet Commonly known as: Zofran Take 1 tablet (4 mg total) by mouth 2 (two) times daily as needed for nausea or vomiting.   pantoprazole 40 MG tablet Commonly known as: PROTONIX Take 1 tablet (40 mg total) by mouth daily.   potassium chloride SA 20 MEQ tablet Commonly known as: Klor-Con M20 Take 1 tablet (20 mEq total) by mouth daily. Start taking on: August 19, 2020   Rivaroxaban 15 MG Tabs tablet Commonly known as: XARELTO Take 1 tablet (15 mg total) by mouth daily with supper.   sennosides-docusate sodium 8.6-50 MG tablet Commonly known as: SENOKOT-S Take 1 tablet by mouth as needed for constipation.   zolpidem 5 MG tablet Commonly known as: AMBIEN Take 1 tablet (5 mg total) by mouth at bedtime. What changed: how much to take            Durable Medical Equipment  (From admission, onward)         Start     Ordered   08/17/20 1713  For home use only DME 4 wheeled rolling walker with seat  Once        Question:  Patient needs a walker to treat with the following condition  Answer:  Weakness   08/17/20 1712   08/17/20 1620  For home use only DME Walker rolling  Once       Comments: junior  Question Answer Comment  Walker: With [place]   Patient needs a walker to treat with the following condition Weakness  08/17/20 1620   08/17/20 1111  Heart failure home health orders  (Heart failure home health orders / Face to face)  Once       Comments: Heart Failure Follow-up Care:  Verify follow-up appointments per Patient Discharge Instructions. Confirm transportation arranged. Reconcile home medications with discharge medication list. Remove discontinued medications from use. Assist patient/caregiver to manage medications using pill box. Reinforce low sodium food selection Assessments: Vital signs and oxygen saturation at each visit. Assess home environment for safety concerns, caregiver support and availability of low-sodium foods. Consult Education officer, museum, PT/OT, Dietitian, and CNA based on assessments. Perform comprehensive cardiopulmonary assessment. Notify MD for any change in condition or weight gain of 3 pounds in one day or 5 pounds in one week with symptoms. Daily Weights and Symptom Monitoring: Ensure patient has access to scales. Teach patient/caregiver to weigh daily before breakfast and after voiding using same scale and record.    Teach patient/caregiver to track weight and symptoms and when to notify Provider. Activity: Develop individualized activity plan with patient/caregiver.   Home Paraenteral Inotropic Therapy : Data Collection Form  Patients name: Cheryl Vargas   Date: 08/17/20 NYHA IV  Information below may not be completed by the supplier nor anyone in a Financial relationship with the supplier.  1. Results of invasive hemodynamic monitoring  Cardiac Index Before Inotrope infusion:            1.5          On Inotrope infusion:            1.9             Drug and dose:  Milrinone 0.25 mcg/kg/min  2. Cardiac medications immediately prior to inotrope infusion (List name, dose, and frequency)  None   3. Dose this represent maximum tolerated doses of these medications? Yes.   4. Breathing status Prior to inotrope infusion: Dyspnea at rest  At time of discharge: Dyspnea on moderate  exertion.   5. Initial home prescription Drug and Dose:   Milrinone 0.25 mcg  for continuous infusion 24/hr day and 7 days/week  6. If continuous infusion is prescribed, have attempts to discontinue inotrope infusion in the hospital failed?   Yes.   7. If intermittent infusion is prescribed, have there been repeated hospitalizations for heart failure which Parenteral inotrope were required? Not applicable.   8. Is patient capable of going to the physician for outpatient evaluation? Yes.   9. Is routine electrocardiographic monitoring required in the Home?  No.   The above statements and any additional explanations included separately are true and accurate and there is documentation present in the patients medical record to support these statements.   Completed by Darrick Grinder, NP   [facility] to provide  Labs every other week to include BMET, Mg, and CBC with Diff. Additional as needed. Should be drawn via PERIPHERAL stick. NOT PICC line.   O5366 Milrinone 0.25  mcg/kg/min X 52 weeks  A4221 Supplies for maintenance of drug infusion catheter A4222 Supplies for the external drug infusion per cassette or bag E0781 Ambulatory Infusion pump   In instances where this form was completed by an Advanced Practice Provider, please see EMR for physician Co-Signature.  Question Answer Comment  Heart Failure Follow-up Care Advanced Heart Failure (AHF) Clinic at [phone]   Obtain the following labs Basic Metabolic Panel   Lab frequency Weekly   Fax lab results to AHF Clinic at [phone]   Diet Low Sodium Heart Healthy  Fluid restrictions: 1800 mL Fluid    Consult: Social work   Initiate Heart Failure Clinic Diuretic Protocol to be used by [place] only ( to be ordered by Heart Failure Team Providers Only) No      08/17/20 1114          Disposition   The patient will be discharged in stable condition to home. Discharge Instructions    (HEART FAILURE PATIENTS) Call MD:  Anytime you have any of the following symptoms: 1) 3 pound weight gain in 24 hours or 5 pounds in 1 week 2) shortness of breath, with or without a dry hacking cough 3) swelling in the hands, feet or stomach 4) if you have to sleep on extra pillows at night in order to breathe.   Complete by: As directed    Diet - low sodium heart healthy   Complete by: As directed    Heart Failure patients record your daily weight using the same scale at the same time of day   Complete by: As directed    Increase activity slowly   Complete by: As directed       Follow-up Information    [facility] Follow up on 09/08/2020.   Specialty: Cardiology Why: at 200.  Contact information: [address] [id number] [place] up.   Specialty: Home Health Services Why: HHRN,HHPT Contact information: [place] [address] [phone]        [facility] Follow up.   Why: outpatient palliative services Contact information: [facility] Follow up.   Why: Nursing follow up Contact information: [place] [address] [place].   Why: rollator will be delivered to room prior to dc                Duration of Discharge Encounter: Greater than 35 minutes   Signed, [place] Huberty NP-C  08/18/2020, 10:53 AM

## 2020-08-17 NOTE — TOC Transition Note (Addendum)
Transition of Care [facility]) - CM/SW Discharge Note   Patient Details  Name: Cheryl Vargas MRN: [id number] Date of Birth: 1935/06/11  [facility] CM/SW Contact:  Zenon Mayo, RN Phone Number: 08/17/2020, 4:25 PM   Clinical Narrative:    Patient is for dc tomorrow, son [name] would like [facility] for [facility], HHPT, referral given to [name] with [facility], she is able to take referral.  Soc will begin Wed on 12/15. Which the milrinone bag will need to be changed once a week on every Wed.  [facility] will also provide a Nurse for follow up between visits for [facility], they will go out on day of discharge.  [facility] Govern will go out on Thursday or Friday before [facility] goes out to do picc care etc. This information was explained to son [name] and patient.   NCM made referral to [employer] for junior walker for patient, this will be brought to room before dc tomorrow. [name] will be here at 11 am tomorrow to do teaching with son.  NCM received call back stating they do not have a junior walker.  NCM checked with [employer] to see if they had one, patient states she wants a rollator not a rolling walker.  Jermaine with [employer] will bring the rollator in the am.    Final next level of care: [place] Barriers to Discharge: No Barriers Identified   Patient Goals and CMS Choice Patient states their goals for this hospitalization and ongoing recovery are:: home with hh services CMS Medicare.gov Compare Post Acute Care list provided to:: Patient Represenative (must comment) Choice offered to / list presented to : Adult Children  Discharge Placement                       Discharge Plan and Services In-house Referral: Clinical Social Work   Post Acute Care Choice: [place]          DME Arranged: [place] Rile youth DME Agency: [employer] Date DME Agency Contacted: 08/17/20 Time DME Agency Contacted: [phone] Representative spoke with at DME Agency: [place]: RN, PT [place] Agency: [facility] Date [place]: 08/17/20 Time [place]: 1625 Representative spoke with at [place]: [place] ([place]) Interventions     Readmission Risk Interventions No flowsheet data found.

## 2020-08-17 NOTE — Progress Notes (Signed)

## 2020-08-17 NOTE — Progress Notes (Signed)
Pt. Is refusing multiple medications except for ativan and ambien. Pt is refusing care and then calls son to tell her that she has not been cared for.

## 2020-08-17 NOTE — Progress Notes (Signed)
Patient ID: Cheryl Vargas, female   DOB: [birth year], 85 y.o.   MRN: [id number]       Advanced Heart Failure Rounding Note  PCP-Cardiologist: [name], MD   Subjective:    Had localized pulmonary artery perforation during cath on 11/24. Intubated and underwent successful coil embolization with IR. Had VT x 1 during cath and shocked.  Extubated 11/25.  No further hemoptysis.   Remains on milrinone 0.25 mcg, co-ox 50% today.  Remains on midodrine 10 tid.  CVP 7-8 on po Lasix.   No complaints, remains weak.    - ECHO EF 20-25% RV normal, biatrial enlargement, IVC normal.   - RHC 11/24  Procedural Findings (incomplete study due to emergency): Hemodynamics (mmHg) RA mean 6 PA 41/16, mean 26 PCWP mean 19 Oxygen saturations: PA 57% AO 93% Cardiac Output (Fick) 2.92  Cardiac Index (Fick) 1.8    Objective:   Weight Range: 59.2 kg Body mass index is 21.72 kg/m.   Vital Signs:   Temp:  [98 F (36.7 C)-98.3 F (36.8 C)] 98 F (36.7 C) (12/07 0709) Pulse Rate:  [79-80] 80 (12/07 0709) Resp:  [16-28] 20 (12/07 0709) BP: (99-118)/(57-66) 118/66 (12/07 0709) SpO2:  [93 %-98 %] 93 % (12/07 0709) Weight:  [59.2 kg] 59.2 kg (12/07 0430) Last BM Date: 08/15/20  Weight change: Filed Weights   08/15/20 0655 08/16/20 0349 08/17/20 0430  Weight: 58.1 kg 57.2 kg 59.2 kg    Intake/Output:   Intake/Output Summary (Last 24 hours) at 08/17/2020 0801 Last data filed at 08/17/2020 0430 Gross per 24 hour  Intake 1672.96 ml  Output 1150 ml  Net 522.96 ml      Physical Exam   General: NAD, frail Neck: No JVD, no thyromegaly or thyroid nodule.  Lungs: Clear to auscultation bilaterally with normal respiratory effort. CV: Nondisplaced PMI.  Heart regular S1/S2, no S3/S4, 2/6 HSM LLSB.  No peripheral edema.   Abdomen: Soft, nontender, no hepatosplenomegaly, no distention.  Skin: Intact without lesions or rashes.  Neurologic: Alert and oriented x 3.  Psych: Normal  affect. Extremities: No clubbing or cyanosis.  HEENT: Normal.    Telemetry   BiV paced 80s underlying AF Personally reviewed   Labs    CBC No results for input(s): WBC, NEUTROABS, HGB, HCT, MCV, PLT in the last 72 hours. Basic Metabolic Panel Recent Labs    08/16/20 0535 08/17/20 0529  NA 135 136  K 3.5 3.5  CL 102 101  CO2 25 24  GLUCOSE 106* 97  BUN 16 16  CREATININE 1.10* 1.05*  CALCIUM 8.3* 8.4*  MG  --  1.8   Liver Function Tests No results for input(s): AST, ALT, ALKPHOS, BILITOT, PROT, ALBUMIN in the last 72 hours. No results for input(s): LIPASE, AMYLASE in the last 72 hours. Cardiac Enzymes No results for input(s): CKTOTAL, CKMB, CKMBINDEX, TROPONINI in the last 72 hours.  BNP: BNP (last 3 results) Recent Labs    09/27/19 1708 08/02/20 0832  BNP 1,032.7* 1,285.0*    ProBNP (last 3 results) No results for input(s): PROBNP in the last 8760 hours.   D-Dimer No results for input(s): DDIMER in the last 72 hours. Hemoglobin A1C No results for input(s): HGBA1C in the last 72 hours. Fasting Lipid Panel No results for input(s): CHOL, HDL, LDLCALC, TRIG, CHOLHDL, LDLDIRECT in the last 72 hours. Thyroid Function Tests No results for input(s): TSH, T4TOTAL, T3FREE, THYROIDAB in the last 72 hours.  Invalid input(s): FREET3  Other results:  Imaging    No results found.   Medications:     Scheduled Medications: . acetaminophen  1,000 mg Oral Q8H  . amiodarone  200 mg Oral Daily  . calcium carbonate  1 tablet Oral TID WC  . Chlorhexidine Gluconate Cloth  6 each Topical Daily  . docusate sodium  100 mg Oral BID  . furosemide  20 mg Oral Daily  . lidocaine  1 patch Transdermal Q24H  . melatonin  6 mg Oral QHS  . midodrine  10 mg Oral TID WC  . polyethylene glycol  17 g Oral Daily  . potassium chloride  20 mEq Oral Daily  . rivaroxaban  15 mg Oral Q supper    Infusions: . milrinone 0.25 mcg/kg/min (08/16/20 0841)    PRN  Medications: lidocaine-EPINEPHrine, LORazepam, morphine injection, phenol, sodium chloride flush, zolpidem    Patient Profile   Cheryl Vargas is an 85 year old with a history ofHTN, HLD, PAF (On Xarelto), and nonischemic cardiomyopathy with chronic systolic CHF. Cardiomyopathy dates back to at least 2014. Most recent ECHO 09/2019 showed EF < 20%.  Intolerant ranolazine, losartan,spironolactone, amiodarone, sotolol, and tikosyn.  Admitted with presyncope/volume overload.   Assessment/Plan   1. Hemoptysis: Due to small, isolated right PA branch rupture on 11/24. She was intubated and underwent bronchoscopy with clearing of blood. Suspect bleeding contained quickly. S/p coiling PA branch in IR 11/24.  Hgb stable, 12 on co-ox.  Hemoptysis resolved.  - Back on Xarelto.  2. Acute on chronic systolic HF: [place] 01/9562 showed no significant coronary disease. Cardiac MRI 10/17 EF 36%, diffuse HK, normal RV, biatrial enlargement, moderate MR, non-coronary LGE pattern involving the mid-wall of the basal to mid septum and inferior wall. Possibly prior myocarditis versus a form of infiltrative disease. Medtronic CRT-D s/p AV nodal ablation. Echo in 1/21 showed EF <20% with moderate LV dilation and mildly decreased RV systolic function. Low output on 1/21 RHC (CI 1.72). With age and frailty, LVAD not thought to be good option. At last appointment, she was not BiV pacing at an ideal percentage, likely due to PVCs. Symptomatically worse, NYHA class IIIb since decreasing Lasix in setting of rising creatinine. Low output HF noted this admission as well, CI on RHC was 1.8.  She was on milrinone 0.125 prior to [place].  EP has seen, CRT device adjusted and QRS now narrower.  Echo this admission with EF 20-25%, RV low normal systolic function, severe MR and TR.  She diuresed well this admission, PCWP 19 on RHC.  As above, was intubated with pulmonary hemorrhage post-RHC, now extubated and stable.  Off NE and on  milrinone 0.25 currently.  She has end-stage CHF and is inotrope-dependent.  Co-ox 59%, CVP not elevated. Back on low-dose po lasix 20 daily.  - We have been trying to get her to rehab facility on milrinone to allow eventual discharge home on milrinone with [facility] when mobility is improved, but have had multiple conversations regarding rehab beds and none are available due to [place] [facility] [employer]. Her son stated that his mother's insurance with cover Acute Rehab out of network (in [place] where he lives) and they will accept milrinone.  Son has been looking into facilities in [place].  - Continue Lasix 20 mg daily and add KCl 20 daily.  - Continue midodrine 10 mg tid.  -Off digoxin with persistently elevated level despite cutting back to 0.0625 mg qod. - Concerned that frequent PVCs may be affecting function (and also limiting  BiV pacing percentage) => Retrying her on amiodarone. Tolerating so far.  Would stop at discharge if home with hospice.  3. Permanent Atrial fibrillation: Chronic, s/p AV nodal ablation and BiV pacing. Hgb stable.  - Continue xarelto daily.    4. PVCs/VT: Frequent PVCs have limited BiV pacing. Not a good PVC ablation candidate per EP. Had VT in 1/21. Cannot tolerate amiodarone, sotolol, or Tikosyn. Did not tolerate mexilitine with nausea. Had nausea also with ranolazine. Additionally, did not tolerate further uptitration of bisoprolol.  VT during pulmonary hemorrhage episode in setting of low oxygen saturation.  - Continue po amiodarone (stop at discharge if home with hospice).  5. Mitral regurgitation: At least 3+ MR, suspect functional due to dilated annulus with severely dilated LA.  TR appears worse (clearly severe).  Discussed with Dr. Burt Knack, do not think that she would be a good Mitraclip candidate as TR is clearly worse than MR.  6. AKI on CKD stage 3: Stable.  7. Debility: PT/OT consult => recommend SNF. Needs to work with PT daily, out of bed  as much as possible.   Her son stated that his mother's insurance with cover Acute Rehab out of network (in [place] where he lives) and they will accept milrinone.  He would like to work with [facility] to secure a rehab facility in [place]. If this is not possible, may go home with [facility] and family will have to hire additional care.  Our social worker will contact son today to try to work out a firm plan.   [name] MD 08/17/2020 8:01 AM

## 2020-08-18 DIAGNOSIS — I5043 Acute on chronic combined systolic (congestive) and diastolic (congestive) heart failure: Secondary | ICD-10-CM | POA: Diagnosis not present

## 2020-08-18 LAB — COOXEMETRY PANEL
Carboxyhemoglobin: 1.4 % (ref 0.5–1.5)
Methemoglobin: 0.8 % (ref 0.0–1.5)
O2 Saturation: 54.1 %
Total hemoglobin: 12.6 g/dL (ref 12.0–16.0)

## 2020-08-18 LAB — BASIC METABOLIC PANEL
Anion gap: 10 (ref 5–15)
BUN: 19 mg/dL (ref 8–23)
CO2: 24 mmol/L (ref 22–32)
Calcium: 9 mg/dL (ref 8.9–10.3)
Chloride: 102 mmol/L (ref 98–111)
Creatinine, Ser: 1.21 mg/dL — ABNORMAL HIGH (ref 0.44–1.00)
GFR, Estimated: 44 mL/min — ABNORMAL LOW (ref >60)
Glucose, Bld: 127 mg/dL — ABNORMAL HIGH (ref 70–99)
Potassium: 3.8 mmol/L (ref 3.5–5.1)
Sodium: 136 mmol/L (ref 135–145)

## 2020-08-18 MED ORDER — PANTOPRAZOLE SODIUM 40 MG PO TBEC: 40.0000 mg | DELAYED_RELEASE_TABLET | Freq: Every day | ORAL | 6 refills | Status: AC

## 2020-08-18 MED ORDER — AMIODARONE HCL 200 MG PO TABS: 200.0000 mg | ORAL_TABLET | Freq: Every day | ORAL | 6 refills | Status: DC

## 2020-08-18 MED ORDER — POTASSIUM CHLORIDE CRYS ER 20 MEQ PO TBCR: 20.0000 meq | EXTENDED_RELEASE_TABLET | Freq: Every day | ORAL | Status: DC

## 2020-08-18 MED ORDER — MILRINONE LACTATE IN DEXTROSE 20-5 MG/100ML-% IV SOLN: 0.2500 ug/kg/min | INTRAVENOUS | 52 refills | Status: DC

## 2020-08-18 MED ORDER — ONDANSETRON HCL 4 MG PO TABS: 4.0000 mg | ORAL_TABLET | Freq: Two times a day (BID) | ORAL | 1 refills | Status: DC | PRN

## 2020-08-18 MED ORDER — ONDANSETRON HCL 4 MG PO TABS
4.0000 mg | ORAL_TABLET | Freq: Once | ORAL | Status: AC
  Administered 2020-08-18: 4 mg via ORAL
  Filled 2020-08-18: qty 1

## 2020-08-18 MED ORDER — PANTOPRAZOLE SODIUM 40 MG PO TBEC
40.0000 mg | DELAYED_RELEASE_TABLET | Freq: Every day | ORAL | Status: DC
  Administered 2020-08-18: 40 mg via ORAL
  Filled 2020-08-18: qty 1

## 2020-08-18 MED ORDER — FUROSEMIDE 40 MG PO TABS: 40.0000 mg | ORAL_TABLET | Freq: Every day | ORAL | 6 refills | Status: DC

## 2020-08-18 MED ORDER — POTASSIUM CHLORIDE CRYS ER 20 MEQ PO TBCR: 20.0000 meq | EXTENDED_RELEASE_TABLET | Freq: Every day | ORAL | 3 refills | Status: DC

## 2020-08-18 MED ORDER — FUROSEMIDE 40 MG PO TABS
40.0000 mg | ORAL_TABLET | Freq: Every day | ORAL | Status: DC
  Administered 2020-08-18: 40 mg via ORAL
  Filled 2020-08-18: qty 1

## 2020-08-18 MED ORDER — CALCIUM CARBONATE ANTACID 500 MG PO CHEW
1.0000 | CHEWABLE_TABLET | Freq: Once | ORAL | Status: AC
  Administered 2020-08-18: 200 mg via ORAL
  Filled 2020-08-18: qty 1

## 2020-08-18 MED ORDER — MIDODRINE HCL 10 MG PO TABS: 10.0000 mg | ORAL_TABLET | Freq: Three times a day (TID) | ORAL | 6 refills | Status: DC

## 2020-08-18 MED ORDER — POTASSIUM CHLORIDE CRYS ER 20 MEQ PO TBCR
40.0000 meq | EXTENDED_RELEASE_TABLET | Freq: Once | ORAL | Status: AC
  Administered 2020-08-18: 40 meq via ORAL
  Filled 2020-08-18: qty 2

## 2020-08-18 MED FILL — ONDANSETRON HCL 4 MG TABLET: 4 | 15 days supply | Qty: 30 | Fill #0

## 2020-08-18 MED FILL — MIDODRINE HCL 10 MG TABLET: 10 | 30 days supply | Qty: 90 | Fill #0

## 2020-08-18 MED FILL — AMIODARONE HCL 200 MG TAB: 200 | 30 days supply | Qty: 30 | Fill #0

## 2020-08-18 MED FILL — PANTOPRAZOLE SOD DR 40 MG T: 40 | 30 days supply | Qty: 30 | Fill #0

## 2020-08-18 MED FILL — FUROSEMIDE 40 MG TABLET: 40 | 30 days supply | Qty: 30 | Fill #0

## 2020-08-18 NOTE — Progress Notes (Signed)
Pt. Reported constipation despite daily bowel movements. Pt requested sennokot and bowel regimen. MD was paged and new orders were given. Pt then had a BM and refused medication.

## 2020-08-18 NOTE — Care Management (Signed)
Carolynn Sayers, RN IV Specialist, confirmed that everything is in place for patient for Milrinone. She is meeting with son at 12:30 today. Previous TOC CM has detailed note entered on 08/17/20. No further needs. Ricki Miller, RN BSN Case Manager [phone]

## 2020-08-18 NOTE — Progress Notes (Signed)
D/C instructions given and reviewed. Questions asked and answered. Encouraged to call with any further concerns.

## 2020-08-18 NOTE — Progress Notes (Signed)
PT Cancellation Note  Patient Details Name: Cheryl Vargas MRN: [id number] DOB: 01-10-1935   Cancelled Treatment:    Reason Eval/Treat Not Completed: (P) Patient declined, no reason specified (Pt reports fatigue from OT session and declined PT intervention at this time.  PTA educated on the benefits and she continues to decline at this time.)   [name] 08/18/2020, 10:26 AM  Erasmo Leventhal , PTA Acute Rehabilitation Services Pager [phone] Office [phone]

## 2020-08-18 NOTE — Plan of Care (Signed)
Problem: Education: Goal: Knowledge of General Education information will improve Description: Including pain rating scale, medication(s)/side effects and non-pharmacologic comfort measures Outcome: Adequate for Discharge   Problem: Health Behavior/Discharge Planning: Goal: Ability to manage health-related needs will improve Outcome: Adequate for Discharge   Problem: Clinical Measurements: Goal: Ability to maintain clinical measurements within normal limits will improve Outcome: Adequate for Discharge Goal: Will remain free from infection Outcome: Adequate for Discharge Goal: Diagnostic test results will improve Outcome: Adequate for Discharge Goal: Respiratory complications will improve Outcome: Adequate for Discharge Goal: Cardiovascular complication will be avoided Outcome: Adequate for Discharge   Problem: Activity: Goal: Risk for activity intolerance will decrease Outcome: Adequate for Discharge   Problem: Nutrition: Goal: Adequate nutrition will be maintained Outcome: Adequate for Discharge   Problem: Coping: Goal: Level of anxiety will decrease Outcome: Adequate for Discharge   Problem: Elimination: Goal: Will not experience complications related to bowel motility Outcome: Adequate for Discharge Goal: Will not experience complications related to urinary retention Outcome: Adequate for Discharge   Problem: Pain Managment: Goal: General experience of comfort will improve Outcome: Adequate for Discharge   Problem: Safety: Goal: Ability to remain free from injury will improve Outcome: Adequate for Discharge   Problem: Skin Integrity: Goal: Risk for impaired skin integrity will decrease Outcome: Adequate for Discharge   Problem: Education: Goal: Understanding of CV disease, CV risk reduction, and recovery process will improve Outcome: Adequate for Discharge   Problem: Activity: Goal: Ability to return to baseline activity level will improve Outcome:  Adequate for Discharge   Problem: Cardiovascular: Goal: Ability to achieve and maintain adequate cardiovascular perfusion will improve Outcome: Adequate for Discharge Goal: Vascular access site(s) Level 0-1 will be maintained Outcome: Adequate for Discharge   Problem: Health Behavior/Discharge Planning: Goal: Ability to safely manage health-related needs after discharge will improve Outcome: Adequate for Discharge   Problem: Education: Goal: Knowledge of General Education information will improve Description: Including pain rating scale, medication(s)/side effects and non-pharmacologic comfort measures Outcome: Adequate for Discharge   Problem: Health Behavior/Discharge Planning: Goal: Ability to manage health-related needs will improve Outcome: Adequate for Discharge   Problem: Clinical Measurements: Goal: Ability to maintain clinical measurements within normal limits will improve Outcome: Adequate for Discharge Goal: Will remain free from infection Outcome: Adequate for Discharge Goal: Diagnostic test results will improve Outcome: Adequate for Discharge Goal: Respiratory complications will improve Outcome: Adequate for Discharge Goal: Cardiovascular complication will be avoided Outcome: Adequate for Discharge   Problem: Activity: Goal: Risk for activity intolerance will decrease Outcome: Adequate for Discharge   Problem: Nutrition: Goal: Adequate nutrition will be maintained Outcome: Adequate for Discharge   Problem: Coping: Goal: Level of anxiety will decrease Outcome: Adequate for Discharge   Problem: Elimination: Goal: Will not experience complications related to bowel motility Outcome: Adequate for Discharge Goal: Will not experience complications related to urinary retention Outcome: Adequate for Discharge   Problem: Pain Managment: Goal: General experience of comfort will improve Outcome: Adequate for Discharge   Problem: Safety: Goal: Ability to remain  free from injury will improve Outcome: Adequate for Discharge   Problem: Skin Integrity: Goal: Risk for impaired skin integrity will decrease Outcome: Adequate for Discharge   Problem: Education: Goal: Understanding of CV disease, CV risk reduction, and recovery process will improve Outcome: Adequate for Discharge Goal: Individualized Educational Video(s) Outcome: Adequate for Discharge   Problem: Activity: Goal: Ability to return to baseline activity level will improve Outcome: Adequate for Discharge   Problem: Cardiovascular: Goal: Ability to  achieve and maintain adequate cardiovascular perfusion will improve Outcome: Adequate for Discharge Goal: Vascular access site(s) Level 0-1 will be maintained Outcome: Adequate for Discharge   Problem: Health Behavior/Discharge Planning: Goal: Ability to safely manage health-related needs after discharge will improve Outcome: Adequate for Discharge

## 2020-08-18 NOTE — Progress Notes (Signed)
Occupational Therapy Treatment Patient Details Name: Cheryl Vargas MRN: [id number] DOB: 10-08-1934 Today's Date: 08/18/2020    History of present illness Pt is an 85 year old woman admitted on 08/04/20 with acute on chronic heart failure. Hospital course complicated by sudden massive hemoptysis during cardiac catherization on 11/24 requiring emergent intubation and bronchoscopy. Extubated 11/25. PMH: NICM with EF 20%, HTN, HLD, PAF.   OT comments  Pt well aware of plan for home and all that entails. Son will take her home and stay with her. She will also have hired assistance at home. Pt continues to demonstrated poor activity tolerance. She sat EOB for shampoo cap and to comb hair as well as medications and stood for pericare and change of linen, but needed to return to supine due to fatigue. VSS throughout. Pt reporting need for 02, Sp02 at 92% on RA, 100% on 2L with pt feeling relief.   Follow Up Recommendations  Home health OT;Supervision/Assistance - 24 hour    Equipment Recommendations  3 in 1 bedside commode (rollator)    Recommendations for Other Services      Precautions / Restrictions Precautions Precautions: Fall       Mobility Bed Mobility Overal bed mobility: Modified Independent             General bed mobility comments: increased time, use of rail  Transfers Overall transfer level: Needs assistance Equipment used: Rolling walker (2 wheeled) Transfers: Sit to/from Stand Sit to Stand: Min assist         General transfer comment: min assist to rise and steady, took several steps to [facility] after pericare    Balance Overall balance assessment: Needs assistance   Sitting balance-Leahy Scale: Good     Standing balance support: Bilateral upper extremity supported;During functional activity Standing balance-Leahy Scale: Fair Standing balance comment: static standing with RW                           ADL either performed or assessed with  clinical judgement   ADL Overall ADL's : Needs assistance/impaired     Grooming: Maximal assistance;Sitting;Brushing hair (shampoo cap)               Lower Body Dressing: Total assistance;Bed level Lower Body Dressing Details (indicate cue type and reason): socks     Toileting- Clothing Manipulation and Hygiene: Total assistance;Sit to/from stand Toileting - Clothing Manipulation Details (indicate cue type and reason): assisted for pericare and to change linens, pt in soiled bed       General ADL Comments: Educated in energy conservation strategies.      Vision       Perception     Praxis      Cognition Arousal/Alertness: Awake/alert Behavior During Therapy: WFL for tasks assessed/performed Overall Cognitive Status: Within Functional Limits for tasks assessed                                          Exercises     Shoulder Instructions       General Comments      Pertinent Vitals/ Pain       Pain Assessment: No/denies pain  Home Living  Prior Functioning/Environment              Frequency  Min 2X/week        Progress Toward Goals  OT Goals(current goals can now be found in the care plan section)  Progress towards OT goals: Not progressing toward goals - comment (pt with poor activity tolerance)  Acute Rehab OT Goals Patient Stated Goal: go home to her cats OT Goal Formulation: With patient/family Time For Goal Achievement: 08/20/20 Potential to Achieve Goals: [place] Discharge plan remains appropriate    Co-evaluation                 AM-PAC OT "6 Clicks" Daily Activity     Outcome Measure   Help from another person eating meals?: None Help from another person taking care of personal grooming?: A Little Help from another person toileting, which includes using toliet, bedpan, or urinal?: Total Help from another person bathing (including washing,  rinsing, drying)?: A Lot Help from another person to put on and taking off regular upper body clothing?: A Little Help from another person to put on and taking off regular lower body clothing?: Total 6 Click Score: 14    End of Session    OT Visit Diagnosis: Unsteadiness on feet (R26.81);Other abnormalities of gait and mobility (R26.89);Muscle weakness (generalized) (M62.81);Other (comment)   Activity Tolerance Patient tolerated treatment well   Patient Left in bed;with call bell/phone within reach;with nursing/sitter in room   Nurse Communication Other (comment) (replaced purewick)        Time: 0912-1000 OT Time Calculation (min): 48 min  Charges: OT General Charges $OT Visit: 1 Visit OT Treatments $Self Care/Home Management : 38-52 mins  Nestor Lewandowsky, OTR/L Acute Rehabilitation Services Pager: [phone] Office: [phone]   Malka So 08/18/2020, 10:13 AM

## 2020-08-18 NOTE — Progress Notes (Signed)
Patient ID: Cheryl Vargas, female   DOB: [birth year], 85 y.o.   MRN: [id number]       Advanced Heart Failure Rounding Note  PCP-Cardiologist: [name], MD   Subjective:    Had localized pulmonary artery perforation during cath on 11/24. Intubated and underwent successful coil embolization with IR. Had VT x 1 during cath and shocked.  Extubated 11/25.  No further hemoptysis.   Remains on milrinone 0.25 mcg, co-ox 54% today.  Remains on midodrine 10 tid.  CVP 10-11 on po Lasix.   Remains weak.  Poor appetite, occasional nausea.   - ECHO EF 20-25% RV normal, biatrial enlargement, IVC normal.   - RHC 11/24  Procedural Findings (incomplete study due to emergency): Hemodynamics (mmHg) RA mean 6 PA 41/16, mean 26 PCWP mean 19 Oxygen saturations: PA 57% AO 93% Cardiac Output (Fick) 2.92  Cardiac Index (Fick) 1.8    Objective:   Weight Range: 58.1 kg Body mass index is 21.31 kg/m.   Vital Signs:   Temp:  [97.9 F (36.6 C)-98 F (36.7 C)] 97.9 F (36.6 C) (12/08 0400) Pulse Rate:  [80-86] 80 (12/08 0400) Resp:  [18-24] 18 (12/08 0400) BP: (114-131)/(59-72) 125/70 (12/08 0400) SpO2:  [90 %-96 %] 96 % (12/08 0400) Weight:  [58.1 kg] 58.1 kg (12/08 0400) Last BM Date: 08/18/20  Weight change: Filed Weights   08/17/20 0430 08/18/20 0031 08/18/20 0400  Weight: 59.2 kg 58.1 kg 58.1 kg    Intake/Output:   Intake/Output Summary (Last 24 hours) at 08/18/2020 0826 Last data filed at 08/18/2020 0300 Gross per 24 hour  Intake 543.04 ml  Output --  Net 543.04 ml      Physical Exam   General: NAD, frail Neck: JVP 8-9 cm, no thyromegaly or thyroid nodule.  Lungs: Clear to auscultation bilaterally with normal respiratory effort. CV: Nondisplaced PMI.  Heart regular S1/S2, no S3/S4, 2/6 HSM apex.  No peripheral edema.   Abdomen: Soft, nontender, no hepatosplenomegaly, no distention.  Skin: Intact without lesions or rashes.  Neurologic: Alert and oriented x 3.   Psych: Normal affect. Extremities: No clubbing or cyanosis.  HEENT: Normal.    Telemetry   BiV paced 80s underlying AF Personally reviewed   Labs    CBC No results for input(s): WBC, NEUTROABS, HGB, HCT, MCV, PLT in the last 72 hours. Basic Metabolic Panel Recent Labs    08/17/20 0529 08/18/20 0413  NA 136 136  K 3.5 3.8  CL 101 102  CO2 24 24  GLUCOSE 97 127*  BUN 16 19  CREATININE 1.05* 1.21*  CALCIUM 8.4* 9.0  MG 1.8  --    Liver Function Tests No results for input(s): AST, ALT, ALKPHOS, BILITOT, PROT, ALBUMIN in the last 72 hours. No results for input(s): LIPASE, AMYLASE in the last 72 hours. Cardiac Enzymes No results for input(s): CKTOTAL, CKMB, CKMBINDEX, TROPONINI in the last 72 hours.  BNP: BNP (last 3 results) Recent Labs    09/27/19 1708 08/02/20 0832  BNP 1,032.7* 1,285.0*    ProBNP (last 3 results) No results for input(s): PROBNP in the last 8760 hours.   D-Dimer No results for input(s): DDIMER in the last 72 hours. Hemoglobin A1C No results for input(s): HGBA1C in the last 72 hours. Fasting Lipid Panel No results for input(s): CHOL, HDL, LDLCALC, TRIG, CHOLHDL, LDLDIRECT in the last 72 hours. Thyroid Function Tests No results for input(s): TSH, T4TOTAL, T3FREE, THYROIDAB in the last 72 hours.  Invalid input(s): FREET3  Other  results:   Imaging    No results found.   Medications:     Scheduled Medications: . acetaminophen  1,000 mg Oral Q8H  . amiodarone  200 mg Oral Daily  . calcium carbonate  1 tablet Oral TID WC  . Chlorhexidine Gluconate Cloth  6 each Topical Daily  . docusate sodium  100 mg Oral BID  . lidocaine  1 patch Transdermal Q24H  . melatonin  6 mg Oral QHS  . midodrine  10 mg Oral TID WC  . polyethylene glycol  17 g Oral Daily  . potassium chloride  20 mEq Oral Daily  . rivaroxaban  15 mg Oral Q supper  . senna  1 tablet Oral Once    Infusions: . milrinone 0.25 mcg/kg/min (08/18/20 0418)    PRN  Medications: lidocaine-EPINEPHrine, LORazepam, morphine injection, phenol, sodium chloride flush, zolpidem    Patient Profile   Cheryl Vargas is an 85 year old with a history ofHTN, HLD, PAF (On Xarelto), and nonischemic cardiomyopathy with chronic systolic CHF. Cardiomyopathy dates back to at least 2014. Most recent ECHO 09/2019 showed EF < 20%.  Intolerant ranolazine, losartan,spironolactone, amiodarone, sotolol, and tikosyn.  Admitted with presyncope/volume overload.   Assessment/Plan   1. Hemoptysis: Due to small, isolated right PA branch rupture on 11/24. She was intubated and underwent bronchoscopy with clearing of blood. Suspect bleeding contained quickly. S/p coiling PA branch in IR 11/24.  Hgb stable, 12 on co-ox.  Hemoptysis resolved.  - Back on Xarelto.  2. Acute on chronic systolic HF: [place] 09/6107 showed no significant coronary disease. Cardiac MRI 10/17 EF 36%, diffuse HK, normal RV, biatrial enlargement, moderate MR, non-coronary LGE pattern involving the mid-wall of the basal to mid septum and inferior wall. Possibly prior myocarditis versus a form of infiltrative disease. Medtronic CRT-D s/p AV nodal ablation. Echo in 1/21 showed EF <20% with moderate LV dilation and mildly decreased RV systolic function. Low output on 1/21 RHC (CI 1.72). With age and frailty, LVAD not thought to be good option. At last appointment, she was not BiV pacing at an ideal percentage, likely due to PVCs. Symptomatically worse, NYHA class IIIb since decreasing Lasix in setting of rising creatinine. Low output HF noted this admission as well, CI on RHC was 1.8.  She was on milrinone 0.125 prior to [place].  EP has seen, CRT device adjusted and QRS now narrower.  Echo this admission with EF 20-25%, RV low normal systolic function, severe MR and TR.  She diuresed well this admission, PCWP 19 on RHC.  As above, was intubated with pulmonary hemorrhage post-RHC, now extubated and stable.  Off NE and on  milrinone 0.25 currently.  She has end-stage CHF and is inotrope-dependent.  Co-ox 54%, CVP up a bit today to 10-11, poor appetite and some nausea.  - Increase Lasix to 40 mg po daily with KCl 20 daily.  - We have been trying to get her to rehab facility on milrinone to allow eventual discharge home on milrinone with [facility] when mobility is improved, but have had multiple conversations regarding rehab beds and none are available due to [place] at [employer]. Her son stated that his mother's insurance with cover Acute Rehab out of network (in [place] where he lives) and they will accept milrinone.  Son has been looking into facilities in [place].  - Continue midodrine 10 mg tid.  -Off digoxin with persistently elevated level despite cutting back to 0.0625 mg qod. - Concerned that frequent PVCs may  be affecting function (and also limiting BiV pacing percentage) => Retrying her on amiodarone. Tolerating so far.   3. Permanent Atrial fibrillation: Chronic, s/p AV nodal ablation and BiV pacing. Hgb stable.  - Continue xarelto daily.    4. PVCs/VT: Frequent PVCs have limited BiV pacing. Not a good PVC ablation candidate per EP. Had VT in 1/21. Cannot tolerate amiodarone, sotolol, or Tikosyn. Did not tolerate mexilitine with nausea. Had nausea also with ranolazine. Additionally, did not tolerate further uptitration of bisoprolol.  VT during pulmonary hemorrhage episode in setting of low oxygen saturation.  - Continue po amiodarone.  5. Mitral regurgitation: At least 3+ MR, suspect functional due to dilated annulus with severely dilated LA.  TR appears worse (clearly severe).  Discussed with Dr. Burt Knack, do not think that she would be a good Mitraclip candidate as TR is clearly worse than MR.  6. AKI on CKD stage 3: Stable.  7. Debility: PT/OT consult => recommend SNF. Needs to work with PT daily, out of bed as much as possible.   Due to COVID outbreak at [facility], unable to find  facility to take milrinone.  Extensive discussions with family, for now will go home with home health/PT on home milrinone with 24 hr care and palliative care.  If she fails at home, will need to make decision on full hospice.    [name] MD 08/18/2020 8:26 AM

## 2020-08-23 ENCOUNTER — Telehealth: Payer: Self-pay

## 2020-08-23 ENCOUNTER — Ambulatory Visit (INDEPENDENT_AMBULATORY_CARE_PROVIDER_SITE_OTHER): Payer: PPO

## 2020-08-23 DIAGNOSIS — D250 Submucous leiomyoma of uterus: Secondary | ICD-10-CM | POA: Diagnosis not present

## 2020-08-23 DIAGNOSIS — E288 Other ovarian dysfunction: Secondary | ICD-10-CM | POA: Diagnosis not present

## 2020-08-23 DIAGNOSIS — Z95810 Presence of automatic (implantable) cardiac defibrillator: Secondary | ICD-10-CM

## 2020-08-23 DIAGNOSIS — I5022 Chronic systolic (congestive) heart failure: Secondary | ICD-10-CM | POA: Diagnosis not present

## 2020-08-23 DIAGNOSIS — Z319 Encounter for procreative management, unspecified: Secondary | ICD-10-CM | POA: Diagnosis not present

## 2020-08-23 DIAGNOSIS — D251 Intramural leiomyoma of uterus: Secondary | ICD-10-CM | POA: Diagnosis not present

## 2020-08-25 DIAGNOSIS — Z452 Encounter for adjustment and management of vascular access device: Secondary | ICD-10-CM | POA: Diagnosis not present

## 2020-08-25 DIAGNOSIS — I5042 Chronic combined systolic (congestive) and diastolic (congestive) heart failure: Secondary | ICD-10-CM | POA: Diagnosis not present

## 2020-08-25 DIAGNOSIS — I472 Ventricular tachycardia: Secondary | ICD-10-CM | POA: Diagnosis not present

## 2020-08-25 DIAGNOSIS — Z95810 Presence of automatic (implantable) cardiac defibrillator: Secondary | ICD-10-CM | POA: Diagnosis not present

## 2020-08-25 DIAGNOSIS — Z96642 Presence of left artificial hip joint: Secondary | ICD-10-CM | POA: Diagnosis not present

## 2020-08-25 DIAGNOSIS — I428 Other cardiomyopathies: Secondary | ICD-10-CM | POA: Diagnosis not present

## 2020-08-25 DIAGNOSIS — I4819 Other persistent atrial fibrillation: Secondary | ICD-10-CM | POA: Diagnosis not present

## 2020-08-25 DIAGNOSIS — I130 Hypertensive heart and chronic kidney disease with heart failure and stage 1 through stage 4 chronic kidney disease, or unspecified chronic kidney disease: Secondary | ICD-10-CM | POA: Diagnosis not present

## 2020-08-25 DIAGNOSIS — N1831 Chronic kidney disease, stage 3a: Secondary | ICD-10-CM | POA: Diagnosis not present

## 2020-08-25 DIAGNOSIS — M1990 Unspecified osteoarthritis, unspecified site: Secondary | ICD-10-CM | POA: Diagnosis not present

## 2020-08-25 DIAGNOSIS — Z79899 Other long term (current) drug therapy: Secondary | ICD-10-CM | POA: Diagnosis not present

## 2020-08-25 DIAGNOSIS — Z9181 History of falling: Secondary | ICD-10-CM | POA: Diagnosis not present

## 2020-08-25 DIAGNOSIS — I5043 Acute on chronic combined systolic (congestive) and diastolic (congestive) heart failure: Secondary | ICD-10-CM | POA: Diagnosis not present

## 2020-08-25 DIAGNOSIS — I340 Nonrheumatic mitral (valve) insufficiency: Secondary | ICD-10-CM | POA: Diagnosis not present

## 2020-08-25 DIAGNOSIS — Z7901 Long term (current) use of anticoagulants: Secondary | ICD-10-CM | POA: Diagnosis not present

## 2020-08-25 DIAGNOSIS — E785 Hyperlipidemia, unspecified: Secondary | ICD-10-CM | POA: Diagnosis not present

## 2020-08-25 NOTE — Progress Notes (Signed)
EPIC Encounter for ICM Monitoring  Patient Name: Cheryl Vargas is a 85 y.o. female Date: 08/25/2020 Primary Care Physican: Lavone Orn, MD Primary Cardiologist:[place] Electrophysiologist:Allred Bi-V Pacing:94.8% 11/10/2021Weight: 117lbs   Spoke with patient and reports feeling well at this time.  Denies fluid symptoms.   She eats restaurants foods.    OptivolThoracic impedancesuggesting possible fluid accumulation starting 08/03/2020 and returned to baseline normal on 08/23/2020.  Hospitalized 08/02/2020 - 08/17/2020.  Device report shows shock x 1 and ATP x 2 on 08/04/2020 which was during hospitalization.   Prescribed:   Furosemide20 mg take2tablets (40 mg total)by mouthevery morning and 1 tablet (20 mg total) every evening (dosage changed 07/19/2020 per phone note).  Potassium 20 mEq take 1 tablet daily.  Labs: 08/18/2020 Creatinine 1.21, BUN 19, Potassium 3.8, Sodium 136, GFR 44 08/17/2020 Creatinine 1.05, BUN 16, Potassium 3.5, Sodium 136, GFR 53  08/16/2020 Creatinine 1.10, BUN 16, Potassium 3.5, Sodium 135, GFR 50  08/15/2020 Creatinine 1.02, BUN 15, Potassium 4.2, Sodium 135, GFR 55  08/14/2020 Creatinine 1.04, BUN 14, Potassium 4.1, Sodium 135, GFR 53  08/13/2020 Creatinine 1.09, BUN 14, Potassium 4.6, Sodium 133, GFR 50  08/12/2020 Creatinine 1.14, BUN 16, Potassium 4.9, Sodium 134, GFR 48  08/11/2020 Creatinine 1.00, BUN 16, Potassium 4.6, Sodium 134, GFR 56  08/10/2020 Creatinine 1.09, BUN 17, Potassium 3.4, Sodium 130, GFR 50  08/09/2020 Creatinine 1.17, BUN 22, Potassium 3.3, Sodium 133, GFR 46  08/08/2020 Creatinine 1.20, BUN 22, Potassium 3.7, Sodium 132, GFR 45 08/07/2020 Creatinine 1.18, BUN 20, Potassium 4.4, Sodium 134, GFR 46  08/06/2020 Creatinine 1.17, BUN 20, Potassium 3.2, Sodium 134, GFR 46  08/05/2020 Creatinine 1.33, BUN 28, Potassium 3.4, Sodium 136, GFR 40   08/04/2020 Creatinine 1.71, BUN 35, Potassium 3.4, Sodium 136, GFR 29 (10:26 AM) 08/04/2020 Creatinine 1.62, BUN 38, Potassium 2.9, Sodium 137, GFR 31  A complete set of results can be found in Results Review.  Recommendations:Left voice mail with ICM number and encouraged to call if experiencing any fluid symptoms.  Follow-up plan: ICM clinic phone appointment on2/09/2019. 91 day device clinic remote transmission12/29/2021.   EP/Cardiology Office Visits:09/08/2020 with Dr Aundra Dubin.  09/27/2020 with Dr Rayann Heman  Copy of ICM check sent to Dr.Allred.  3 month ICM trend: 08/23/2020    1 Year ICM trend:       Rosalene Billings, RN 08/25/2020 10:05 AM

## 2020-08-26 ENCOUNTER — Other Ambulatory Visit (HOSPITAL_COMMUNITY): Payer: Self-pay | Admitting: Cardiology

## 2020-09-01 DIAGNOSIS — N1831 Chronic kidney disease, stage 3a: Secondary | ICD-10-CM | POA: Diagnosis not present

## 2020-09-01 DIAGNOSIS — I5023 Acute on chronic systolic (congestive) heart failure: Secondary | ICD-10-CM | POA: Diagnosis not present

## 2020-09-01 DIAGNOSIS — Z9181 History of falling: Secondary | ICD-10-CM | POA: Diagnosis not present

## 2020-09-01 DIAGNOSIS — Z79899 Other long term (current) drug therapy: Secondary | ICD-10-CM | POA: Diagnosis not present

## 2020-09-01 DIAGNOSIS — E785 Hyperlipidemia, unspecified: Secondary | ICD-10-CM | POA: Diagnosis not present

## 2020-09-01 DIAGNOSIS — I472 Ventricular tachycardia: Secondary | ICD-10-CM | POA: Diagnosis not present

## 2020-09-01 DIAGNOSIS — I130 Hypertensive heart and chronic kidney disease with heart failure and stage 1 through stage 4 chronic kidney disease, or unspecified chronic kidney disease: Secondary | ICD-10-CM | POA: Diagnosis not present

## 2020-09-01 DIAGNOSIS — M1990 Unspecified osteoarthritis, unspecified site: Secondary | ICD-10-CM | POA: Diagnosis not present

## 2020-09-01 DIAGNOSIS — I428 Other cardiomyopathies: Secondary | ICD-10-CM | POA: Diagnosis not present

## 2020-09-01 DIAGNOSIS — I340 Nonrheumatic mitral (valve) insufficiency: Secondary | ICD-10-CM | POA: Diagnosis not present

## 2020-09-01 DIAGNOSIS — Z452 Encounter for adjustment and management of vascular access device: Secondary | ICD-10-CM | POA: Diagnosis not present

## 2020-09-01 DIAGNOSIS — I5042 Chronic combined systolic (congestive) and diastolic (congestive) heart failure: Secondary | ICD-10-CM | POA: Diagnosis not present

## 2020-09-01 DIAGNOSIS — I5043 Acute on chronic combined systolic (congestive) and diastolic (congestive) heart failure: Secondary | ICD-10-CM | POA: Diagnosis not present

## 2020-09-01 DIAGNOSIS — I4819 Other persistent atrial fibrillation: Secondary | ICD-10-CM | POA: Diagnosis not present

## 2020-09-01 DIAGNOSIS — Z96642 Presence of left artificial hip joint: Secondary | ICD-10-CM | POA: Diagnosis not present

## 2020-09-01 DIAGNOSIS — Z95810 Presence of automatic (implantable) cardiac defibrillator: Secondary | ICD-10-CM | POA: Diagnosis not present

## 2020-09-01 DIAGNOSIS — Z7901 Long term (current) use of anticoagulants: Secondary | ICD-10-CM | POA: Diagnosis not present

## 2020-09-06 ENCOUNTER — Telehealth (HOSPITAL_COMMUNITY): Payer: Self-pay | Admitting: *Deleted

## 2020-09-06 ENCOUNTER — Other Ambulatory Visit (HOSPITAL_COMMUNITY): Payer: Self-pay | Admitting: *Deleted

## 2020-09-06 NOTE — Progress Notes (Signed)
amb  

## 2020-09-06 NOTE — Telephone Encounter (Signed)
That is ok 

## 2020-09-06 NOTE — Telephone Encounter (Signed)
Pt called stating she was just discharged from the hospital and does not have the energy or strength to come in office tomorrow. Pt would like to make her hospital f/u a televisit.   Routed to [place] for advice

## 2020-09-08 ENCOUNTER — Telehealth (HOSPITAL_COMMUNITY): Payer: Self-pay

## 2020-09-08 ENCOUNTER — Other Ambulatory Visit: Payer: Self-pay

## 2020-09-08 ENCOUNTER — Ambulatory Visit (HOSPITAL_COMMUNITY)
Admission: RE | Admit: 2020-09-08 | Discharge: 2020-09-08 | Disposition: A | Discharge status: Home / Self Care | Payer: PPO | Source: Ambulatory Visit | Attending: Cardiology | Admitting: Cardiology

## 2020-09-08 ENCOUNTER — Encounter (HOSPITAL_COMMUNITY): Payer: Self-pay | Admitting: Cardiology

## 2020-09-08 ENCOUNTER — Ambulatory Visit (INDEPENDENT_AMBULATORY_CARE_PROVIDER_SITE_OTHER): Payer: PPO

## 2020-09-08 DIAGNOSIS — I5022 Chronic systolic (congestive) heart failure: Secondary | ICD-10-CM

## 2020-09-08 DIAGNOSIS — I472 Ventricular tachycardia, unspecified: Secondary | ICD-10-CM

## 2020-09-08 DIAGNOSIS — I5043 Acute on chronic combined systolic (congestive) and diastolic (congestive) heart failure: Secondary | ICD-10-CM | POA: Diagnosis not present

## 2020-09-08 LAB — CUP PACEART REMOTE DEVICE CHECK
Battery Remaining Longevity: 28 mo
Battery Voltage: 2.94 V
Brady Statistic AP VP Percent: 0 %
Brady Statistic AP VS Percent: 0 %
Brady Statistic AS VP Percent: 0 %
Brady Statistic AS VS Percent: 0 %
Brady Statistic RA Percent Paced: 0 %
Brady Statistic RV Percent Paced: 95.86 %
Date Time Interrogation Session: 20211229012403
HighPow Impedance: 55 Ohm
Implantable Lead Implant Date: 20181228
Implantable Lead Implant Date: 20181228
Implantable Lead Implant Date: 20181228
Implantable Lead Location: 753858
Implantable Lead Location: 753859
Implantable Lead Location: 753860
Implantable Lead Model: 4598
Implantable Lead Model: 5076
Implantable Pulse Generator Implant Date: 20181228
Lead Channel Impedance Value: 174.595
Lead Channel Impedance Value: 174.595
Lead Channel Impedance Value: 178.5 Ohm
Lead Channel Impedance Value: 190 Ohm
Lead Channel Impedance Value: 194.634
Lead Channel Impedance Value: 285 Ohm
Lead Channel Impedance Value: 323 Ohm
Lead Channel Impedance Value: 342 Ohm
Lead Channel Impedance Value: 380 Ohm
Lead Channel Impedance Value: 380 Ohm
Lead Channel Impedance Value: 399 Ohm
Lead Channel Impedance Value: 4047 Ohm
Lead Channel Impedance Value: 494 Ohm
Lead Channel Impedance Value: 608 Ohm
Lead Channel Impedance Value: 608 Ohm
Lead Channel Impedance Value: 646 Ohm
Lead Channel Impedance Value: 646 Ohm
Lead Channel Impedance Value: 703 Ohm
Lead Channel Pacing Threshold Amplitude: 0.5 V
Lead Channel Pacing Threshold Amplitude: 0.625 V
Lead Channel Pacing Threshold Pulse Width: 0.4 ms
Lead Channel Pacing Threshold Pulse Width: 0.4 ms
Lead Channel Sensing Intrinsic Amplitude: 7.5 mV
Lead Channel Sensing Intrinsic Amplitude: 7.5 mV
Lead Channel Setting Pacing Amplitude: 1 V
Lead Channel Setting Pacing Amplitude: 2.5 V
Lead Channel Setting Pacing Pulse Width: 0.4 ms
Lead Channel Setting Pacing Pulse Width: 0.4 ms
Lead Channel Setting Sensing Sensitivity: 0.3 mV

## 2020-09-08 NOTE — Telephone Encounter (Signed)
Opened in error

## 2020-09-08 NOTE — Telephone Encounter (Signed)
  Patient Consent for Virtual Visit         Cheryl Vargas has provided verbal consent on 09/08/2020 for a virtual visit (video or telephone).   CONSENT FOR VIRTUAL VISIT FOR:  Cheryl Vargas  By participating in this virtual visit I agree to the following:  I hereby voluntarily request, consent and authorize [facility] and its employed or contracted physicians, physician assistants, nurse practitioners or other licensed health care professionals (the Practitioner), to provide me with telemedicine health care services (the "Services") as deemed necessary by the treating Practitioner. I acknowledge and consent to receive the Services by the Practitioner via telemedicine. I understand that the telemedicine visit will involve communicating with the Practitioner through live audiovisual communication technology and the disclosure of certain medical information by electronic transmission. I acknowledge that I have been given the opportunity to request an in-person assessment or other available alternative prior to the telemedicine visit and am voluntarily participating in the telemedicine visit.  I understand that I have the right to withhold or withdraw my consent to the use of telemedicine in the course of my care at any time, without affecting my right to future care or treatment, and that the Practitioner or I may terminate the telemedicine visit at any time. I understand that I have the right to inspect all information obtained and/or recorded in the course of the telemedicine visit and may receive copies of available information for a reasonable fee.  I understand that some of the potential risks of receiving the Services via telemedicine include:  Marland Kitchen Delay or interruption in medical evaluation due to technological equipment failure or disruption; . Information transmitted may not be sufficient (e.g. poor resolution of images) to allow for appropriate medical decision making by the  Practitioner; and/or  . In rare instances, security protocols could fail, causing a breach of personal health information.  Furthermore, I acknowledge that it is my responsibility to provide information about my medical history, conditions and care that is complete and accurate to the best of my ability. I acknowledge that Practitioner's advice, recommendations, and/or decision may be based on factors not within their control, such as incomplete or inaccurate data provided by me or distortions of diagnostic images or specimens that may result from electronic transmissions. I understand that the practice of medicine is not an exact science and that Practitioner makes no warranties or guarantees regarding treatment outcomes. I acknowledge that a copy of this consent can be made available to me via my patient portal ([facility]), or I can request a printed copy by calling the office of [facility].    I understand that my insurance will be billed for this visit.   I have read or had this consent read to me. . I understand the contents of this consent, which adequately explains the benefits and risks of the Services being provided via telemedicine.  . I have been provided ample opportunity to ask questions regarding this consent and the Services and have had my questions answered to my satisfaction. . I give my informed consent for the services to be provided through the use of telemedicine in my medical care

## 2020-09-10 NOTE — Progress Notes (Signed)
Advanced Heart Failure Clinic Note   Heart Failure TeleHealth Note  Due to national recommendations of social distancing due to [place] 19, Audio/video telehealth visit is felt to be most appropriate for this patient at this time.  See MyChart message from today for patient consent regarding telehealth for [facility].  Date:  09/10/2020   ID:  MACKLYN GLANDON, DOB [birth year], MRN [id number]  Location: Home  Provider location: [place] [facility] Failure Type of Visit: Established patient  PCP:  Lavone Orn, MD  Cardiologist:  [name], MD HF Cardiology: Dr. Aundra Dubin  Chief Complaint: Fatigue   History of Present Illness: Cheryl Vargas is a 85 y.o. female who presents via audio/video conferencing for a telehealth visit today.     She denies symptoms worrisome for COVID 19.   Patient has a past medical history of HTN, HLD, PAF (On Xarelto), and nonischemic cardiomyopathy with chronic systolic CHF. Cardiomyopathy dates back to at least 2014 based on echoes (EF 30-35% in 2014).   Admitted 8/17 for fatigue and dyspnea.  She was seen by electrophysiology on 04/17/16, and was started on po Amiodarone as she reported increased palpitations and fatigue with frequent PVCs. Her BNP was elevated at 1592.6 and chest x ray was consistent with CHF.  She was diuresed.  With her complaints of new onset fatigue and dyspnea combined with her known LV dysfunction, it was felt that she would benefit from an ischemic evaluation by cath. She underwent left heart cath with normal cors. Amiodarone was later stopped due to nausea.  Discharge weight 143 pounds. Echo (8/17) with EF 40-45%, inferoseptal akinesis. Cardiac MRI 10/17 with EF 36%, LGE mid wall pattern in the basal to mid septum and inferior wall. Holter monitor in 9/17 with runs of atrial fibrillation/RVR.  She has had trouble tolerating cardiac meds due to hypotension/lightheadedness.    She has had difficulty with symptomatic atrial  fibrillation. She developed symptomatic atrial fibrillation with RVR again in 10/18, difficult to control rate.  She had TEE-guided DCCV with resumption of NSR. On TEE in rapid atrial fibrillation, EF 20-25%.  On TTE after DCCV, EF back to 40-45% range.  MR reported as moderate to severe but I reviewed echo and think it appears more in the moderate range.    She had recurrent atrial fibrillation after 10/18 DCCV. She was admitted in 12/18 with atrial fibrillation and RVR, she also had a run of WCT thought to be VT.   The atrial fibrillation was very difficult to control.  She was thought to be a poor candidate for atrial fibrillation ablation.  She finally had AV nodal ablation with Medtronic CRT-D device in 12/18. She was then admitted briefly with CHF in 1/19.  Echo in 12/18 showed EF 25-30%, moderate MR, moderate TR, severe LAE.  Echo in 4/19 showed EF 25-30%, diffuse hypokinesis, mild MR, moderately decreased RV systolic function.   Admitted 4/7 - 12/21/17 with A/C CHF. Found to have decreased BiV pacing in setting of frequent PVCs. Tried on Mexiletine but failed due to nausea. Switched to Ranexa. Diuresed with IV lasix.   In 5/19, she was admitted with a near-syncopal episode that occurred while straining for a bowel movement.  Suspected vagal event.   She has stopped ranolazine due to nausea.  She did not tolerate increase in bisoprolol.   She was admitted in 8/20 with presyncope; she would get abdominal pain followed by a feeling of warmth and diaphoresis, then she would get dizzy.  No syncope. No explanatory arrhythmias were seen on device interrogation or telemetry in the hospital.  She was not orthostatic or dry.  Thought to be possibly a vagal response to GI trigger (dyspepsia).  Mesenteric dopplers in 8/20 were normal. She feels like Pepcid has helped.   She was admitted in 1/21 with VT and ICD discharge.  RHC showed low output with CI 1.72.  Echo showed EF < 20%, moderate LV dilation, mild RV  dilation with decreased systolic function, mild-moderate MR.  She was started on amiodarone and digoxin.   She was admitted in 5/21 with UTI and hypotension.  Losartan and spironolactone were stopped.    Echo in 12/21 showed EF 20-25% with severe MR, severe TR, and moderate AI.  Patient had [place] in 12/21 due to concern for low output HF.  RHC did show low cardiac output, but this was complicated by localized branch PA perforation requiring coil embolization by IR.  She was started on milrinone with improvement in symptoms though was still very weak.  Given frailty, she was thought to be poor candidate for LVAD placement.  We sent her home on milrinone with home health with plan to transition to hospice with any worsening.   She seems to be stable at home.  Not very active but feels like appetite is improving.  She remains on milrinone 0.25 with home health and home PT following.  Taking Ensure 1-2 times/day.  Using rollator to get around the house without much dypsnea but she continues to fatigue easily.  She is back on amiodarone for frequent PVCs.  No chest pain, palpitations, or syncope.   Labs (8/17): K 4.2, creatinine 0.85, BNP 1593, HCT 43.2, TSH mild elevated 5.3, free T4 normal Labs (9/17): K 4.1, creatinine 0.98 => 0.92, BNP 258, SPEP negative Labs (2/18): K 4, creatinine 0.83, HCT 40.8 Labs (10/18): K 4.2, creatinine 0.94, hgb 14.9 Labs (1/19): K 4.9, creatinine 0.72 Labs (2/19): K 4.1, creatinine 0.97 Labs (5/19): K 3.4, creatinine 1.17 Labs (6/19): K 3.9, creatinine 1.2 Labs (8/19): K 3.7, creatinine 1.14 Labs (1/20): K 3.9, creatinine 1.25 Labs (8/20): K 4.1, creatinine 1.0, hgb 14.4 Labs (1/21): digoxin <0.2, K 3.6, creatinine 1.36 Labs (9/21): digoxin 0.5, K 4.2, creatinine 1.06, hb 15.9 Labs (12/21): K 3.9, creatinine 1.06, hgb 12.4  1. Chronic systolic CHF:  Nonischemic cardiomyopathy.   - Echo (2014): EF 30-35%. - Echo (2/17): EF 40-45% - Echo (8/17): EF 40-45%, mid to  apical inferoseptal akinesis - Coronary angiography (8/17): No significant CAD.  - Cardiac MRI (10/17): EF 36%, moderate MR, normal RV size and systolic function, mid-wall LGE in the basal to mid septum and inferior wall.  - TEE (10/18): EF 20-25% but in rapid afib with HR up to 150 bpm, mild MR.  - Echo (10/18): EF 40-45%, diffuse hypokinesis, moderate LAE, reportedly moderate-severe MR (looks moderate on my review).  - Echo (12/18): EF 25-30%, moderate MR, moderate TR, severe LAE - Medtronic CRT-D device s/p AV nodal ablation.  - Echo (4/19): EF 25-30%, diffuse hypokinesis, mild MR, moderately decreased RV systolic function.  - Echo (8/20): EF 25-30%, normal RV size and systolic function, mild MR, moderate-severe TR.  - RHC (1/21): mean RA 9, PA 39/19, mean PCWP 22, CI 1.72, PVR 2.4 WU - Echo (1/21): EF <20%, moderate LV dilation, mild RV dilation with mildly decreased systolic function, mild-moderate MR.  - Echo (12/21): EF 20-25%, severe biatrial enlargement, severe MR, severe TR, moderate AI.  - RHC (  12/21): mean RA 6, PA 41/16, PCWP 19, CI 1.8 - Now on home milrinone for end stage HF.  2. Atrial fibrillation: Paroxysmal.  Diagnosed 2/17.  Unable to tolerate amiodarone.  - Holter (9/17): atrial fibrillation with RVR runs, 5% PVCs.  - AV nodal ablation with BiV pacing in 12/18.  - Nausea with ranolazine.  3. Hyperlipidemia 4. PVCs: frequent.  5. Long QT interval 6. Mesenteric dopplers (8/20): normal.  7. VT in 1/21 with ICD discharge.   Review of systems complete and found to be negative unless listed in HPI.    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 Occasional ETOH, never heavy.    Current Outpatient Medications  Medication Sig Dispense Refill  . acetaminophen (TYLENOL) 500 MG tablet Take 500 mg by mouth every 6 (six) hours as needed for headache.    Marland Kitchen amiodarone (PACERONE) 200 MG tablet Take 1 tablet (200 mg  total) by mouth daily. 30 tablet 6  . calcium carbonate (TUMS - DOSED IN MG ELEMENTAL CALCIUM) 500 MG chewable tablet Chew 1 tablet by mouth daily as needed for indigestion or heartburn.    . fluticasone (FLONASE) 50 MCG/ACT nasal spray Place 1 spray into both nostrils daily as needed for allergies or rhinitis.     . furosemide (LASIX) 40 MG tablet Take 1 tablet (40 mg total) by mouth daily. 30 tablet 6  . LORazepam (ATIVAN) 0.5 MG tablet Take 1 tablet (0.5 mg total) by mouth daily as needed (nausea). 10 tablet 0  . midodrine (PROAMATINE) 10 MG tablet Take 10 mg by mouth 2 (two) times daily.    . milrinone (PRIMACOR) 20 MG/100 ML SOLN infusion Inject 0.0149 mg/min into the vein continuous. Per Terry 125 mL 52  . ondansetron (ZOFRAN) 4 MG tablet Take 1 tablet (4 mg total) by mouth 2 (two) times daily as needed for nausea or vomiting. 30 tablet 1  . pantoprazole (PROTONIX) 40 MG tablet Take 1 tablet (40 mg total) by mouth daily. 30 tablet 6  . potassium chloride SA (KLOR-CON M20) 20 MEQ tablet Take 1 tablet (20 mEq total) by mouth daily. 90 tablet 3  . Rivaroxaban (XARELTO) 15 MG TABS tablet Take 1 tablet (15 mg total) by mouth daily with supper. 30 tablet 11  . sennosides-docusate sodium (SENOKOT-S) 8.6-50 MG tablet Take 1 tablet by mouth as needed for constipation.    Marland Kitchen zolpidem (AMBIEN) 5 MG tablet Take 1 tablet (5 mg total) by mouth at bedtime. (Patient taking differently: Take 7.5 mg by mouth at bedtime.) 15 tablet 0   No current facility-administered medications for this encounter.    Vitals:   09/08/20 1337  BP: (!) 106/53  Pulse: 81  SpO2: 97%  Weight: 52.6 kg (116 lb)   Wt Readings from Last 3 Encounters:  09/08/20 52.6 kg (116 lb)  08/18/20 58.1 kg (128 lb 1.4 oz)  07/06/20 56.9 kg (125 lb 6.4 oz)    Exam:  (Video/Tele Health Call; Exam is subjective and or/visual.) General:  Speaks in full sentences. No resp difficulty. Lungs: Normal respiratory effort with  conversation.  Abdomen: Non-distended per patient report Extremities: Pt denies edema. Neuro: Alert & oriented x 3.   ASSESSMENT & PLAN:  1. Chronic systolic HF: NICM, LHC 11/2669 showed no significant coronary disease. Cardiac MRI 10/17 EF 36%, diffuse HK, normal RV, biatrial enlargement, moderate MR, non-coronary LGE pattern involving the mid-wall of the basal to mid  septum and inferior wall. Possibly prior myocarditis versus a form of infiltrative disease. Medtronic CRT-D s/p AV nodal ablation.  Echo in 1/21 showed EF < 20% with moderate LV dilation and mildly decreased RV systolic function. Echo in 12/21 with EF 20-25%, severe MR, severe TR, moderate AI.  Low output on 1/21 RHC (CI 1.72) and 12/21 RHC (CI 1.8).  NYHA class III symptoms.  End stage HF now with home milrinone.    - Continue Lasix 40 mg daily. Getting BMETs q 2 wks with milrinone.  - She is off losartan, bisoprolol and spironolactone with symptomatic hypotension and now is on midodrine 10 mg bid.  - Continue milrinone 0.25 mcg/kg/min.  - Off digoxin with elevated level.  - Low output on 1/21 and 12/21 RHCs.  Given her age and frailty, poor candidate for LVAD. End stage CHF now with marked fatigue, some improvement on milrinone.  Will continue palliative home milrinone for now, can transition to hospice if she worsens.  2. Atrial fibrillation: Chronic, s/p AV nodal ablation and BiV pacing.  - Continue Xarelto 15 mg daily.  3. PVCs/VT: Frequent PVCs have limited BiV pacing.  Not a good PVC ablation candidate per EP. Had VT in 1/21.  Cannot tolerate sotalol or Tikosyn. Did not tolerate mexilitine with nausea. Had nausea also with ranolazine. She had nausea in the past with amiodarone but seems to be tolerating it now. - Continue amiodarone 200 mg daily for PVC suppression to try to promote BiV pacing.   4. Mitral regurgitation: Severe on 12/21 echo, not candidate for intervention.    COVID screen The patient does not have any  symptoms that suggest any further testing/ screening at this time.  Social distancing reinforced today.  Patient Risk: After full review of this patients clinical status, I feel that they are at moderate risk for cardiac decompensation at this time.  Relevant cardiac medications were reviewed at length with the patient today. The patient does not have concerns regarding their medications at this time.   Recommended follow-up:  1 month  Today, I have spent 16 minutes with the patient with telehealth technology discussing the above issues .    Signed, [name], MD  09/10/2020  [place] [address] [facility] [place] [address] [phone] (office) [phone] (fax)

## 2020-09-11 DIAGNOSIS — I5043 Acute on chronic combined systolic (congestive) and diastolic (congestive) heart failure: Secondary | ICD-10-CM | POA: Diagnosis not present

## 2020-09-11 DIAGNOSIS — I5023 Acute on chronic systolic (congestive) heart failure: Secondary | ICD-10-CM | POA: Diagnosis not present

## 2020-09-11 DIAGNOSIS — I428 Other cardiomyopathies: Secondary | ICD-10-CM | POA: Diagnosis not present

## 2020-09-15 DIAGNOSIS — I5043 Acute on chronic combined systolic (congestive) and diastolic (congestive) heart failure: Secondary | ICD-10-CM | POA: Diagnosis not present

## 2020-09-15 DIAGNOSIS — Z7901 Long term (current) use of anticoagulants: Secondary | ICD-10-CM | POA: Diagnosis not present

## 2020-09-15 DIAGNOSIS — Z9181 History of falling: Secondary | ICD-10-CM | POA: Diagnosis not present

## 2020-09-15 DIAGNOSIS — Z79899 Other long term (current) drug therapy: Secondary | ICD-10-CM | POA: Diagnosis not present

## 2020-09-15 DIAGNOSIS — I5042 Chronic combined systolic (congestive) and diastolic (congestive) heart failure: Secondary | ICD-10-CM | POA: Diagnosis not present

## 2020-09-15 DIAGNOSIS — I472 Ventricular tachycardia: Secondary | ICD-10-CM | POA: Diagnosis not present

## 2020-09-15 DIAGNOSIS — N1831 Chronic kidney disease, stage 3a: Secondary | ICD-10-CM | POA: Diagnosis not present

## 2020-09-15 DIAGNOSIS — Z96642 Presence of left artificial hip joint: Secondary | ICD-10-CM | POA: Diagnosis not present

## 2020-09-15 DIAGNOSIS — I130 Hypertensive heart and chronic kidney disease with heart failure and stage 1 through stage 4 chronic kidney disease, or unspecified chronic kidney disease: Secondary | ICD-10-CM | POA: Diagnosis not present

## 2020-09-15 DIAGNOSIS — I340 Nonrheumatic mitral (valve) insufficiency: Secondary | ICD-10-CM | POA: Diagnosis not present

## 2020-09-15 DIAGNOSIS — Z452 Encounter for adjustment and management of vascular access device: Secondary | ICD-10-CM | POA: Diagnosis not present

## 2020-09-15 DIAGNOSIS — Z95810 Presence of automatic (implantable) cardiac defibrillator: Secondary | ICD-10-CM | POA: Diagnosis not present

## 2020-09-15 DIAGNOSIS — M1990 Unspecified osteoarthritis, unspecified site: Secondary | ICD-10-CM | POA: Diagnosis not present

## 2020-09-15 DIAGNOSIS — I4819 Other persistent atrial fibrillation: Secondary | ICD-10-CM | POA: Diagnosis not present

## 2020-09-15 DIAGNOSIS — E785 Hyperlipidemia, unspecified: Secondary | ICD-10-CM | POA: Diagnosis not present

## 2020-09-15 DIAGNOSIS — I428 Other cardiomyopathies: Secondary | ICD-10-CM | POA: Diagnosis not present

## 2020-09-16 ENCOUNTER — Other Ambulatory Visit (HOSPITAL_COMMUNITY): Payer: Self-pay | Admitting: Cardiology

## 2020-09-22 DIAGNOSIS — Z79899 Other long term (current) drug therapy: Secondary | ICD-10-CM | POA: Diagnosis not present

## 2020-09-22 DIAGNOSIS — Z7901 Long term (current) use of anticoagulants: Secondary | ICD-10-CM | POA: Diagnosis not present

## 2020-09-22 DIAGNOSIS — I428 Other cardiomyopathies: Secondary | ICD-10-CM | POA: Diagnosis not present

## 2020-09-22 DIAGNOSIS — I472 Ventricular tachycardia: Secondary | ICD-10-CM | POA: Diagnosis not present

## 2020-09-22 DIAGNOSIS — N1831 Chronic kidney disease, stage 3a: Secondary | ICD-10-CM | POA: Diagnosis not present

## 2020-09-22 DIAGNOSIS — I340 Nonrheumatic mitral (valve) insufficiency: Secondary | ICD-10-CM | POA: Diagnosis not present

## 2020-09-22 DIAGNOSIS — Z9181 History of falling: Secondary | ICD-10-CM | POA: Diagnosis not present

## 2020-09-22 DIAGNOSIS — Z452 Encounter for adjustment and management of vascular access device: Secondary | ICD-10-CM | POA: Diagnosis not present

## 2020-09-22 DIAGNOSIS — I5042 Chronic combined systolic (congestive) and diastolic (congestive) heart failure: Secondary | ICD-10-CM | POA: Diagnosis not present

## 2020-09-22 DIAGNOSIS — M1990 Unspecified osteoarthritis, unspecified site: Secondary | ICD-10-CM | POA: Diagnosis not present

## 2020-09-22 DIAGNOSIS — I5043 Acute on chronic combined systolic (congestive) and diastolic (congestive) heart failure: Secondary | ICD-10-CM | POA: Diagnosis not present

## 2020-09-22 DIAGNOSIS — Z95810 Presence of automatic (implantable) cardiac defibrillator: Secondary | ICD-10-CM | POA: Diagnosis not present

## 2020-09-22 DIAGNOSIS — Z96642 Presence of left artificial hip joint: Secondary | ICD-10-CM | POA: Diagnosis not present

## 2020-09-22 DIAGNOSIS — I5023 Acute on chronic systolic (congestive) heart failure: Secondary | ICD-10-CM | POA: Diagnosis not present

## 2020-09-22 DIAGNOSIS — I4819 Other persistent atrial fibrillation: Secondary | ICD-10-CM | POA: Diagnosis not present

## 2020-09-22 DIAGNOSIS — E785 Hyperlipidemia, unspecified: Secondary | ICD-10-CM | POA: Diagnosis not present

## 2020-09-22 DIAGNOSIS — I130 Hypertensive heart and chronic kidney disease with heart failure and stage 1 through stage 4 chronic kidney disease, or unspecified chronic kidney disease: Secondary | ICD-10-CM | POA: Diagnosis not present

## 2020-09-23 ENCOUNTER — Telehealth (HOSPITAL_COMMUNITY): Payer: Self-pay | Admitting: Cardiology

## 2020-09-23 MED ORDER — MAGNESIUM OXIDE -MG SUPPLEMENT 200 MG PO TABS: 200.0000 mg | ORAL_TABLET | Freq: Two times a day (BID) | ORAL | 11 refills | Status: DC

## 2020-09-23 NOTE — Telephone Encounter (Signed)
Abnormal labs received from [facility] drawn 09/23/19 Mg 1.7 Per [name] Clegg,NP add Mag Ox 200mg  twice daily   Attempted to contact pt no answer LMOM

## 2020-09-23 NOTE — Progress Notes (Signed)
Remote ICD transmission.   

## 2020-09-27 ENCOUNTER — Encounter: Payer: PPO | Admitting: Internal Medicine

## 2020-09-28 NOTE — Telephone Encounter (Signed)
Pt aware of results however hesitant to start medication Reports she will wait until labs are repeated at the end of the week and if Mag level is still low then she will restart

## 2020-09-29 DIAGNOSIS — I5043 Acute on chronic combined systolic (congestive) and diastolic (congestive) heart failure: Secondary | ICD-10-CM | POA: Diagnosis not present

## 2020-09-30 ENCOUNTER — Other Ambulatory Visit (HOSPITAL_COMMUNITY): Payer: Self-pay | Admitting: Cardiology

## 2020-10-01 ENCOUNTER — Encounter (HOSPITAL_COMMUNITY): Payer: Self-pay

## 2020-10-01 ENCOUNTER — Other Ambulatory Visit (HOSPITAL_COMMUNITY): Payer: Self-pay | Admitting: *Deleted

## 2020-10-06 DIAGNOSIS — I5043 Acute on chronic combined systolic (congestive) and diastolic (congestive) heart failure: Secondary | ICD-10-CM | POA: Diagnosis not present

## 2020-10-11 DIAGNOSIS — I480 Paroxysmal atrial fibrillation: Secondary | ICD-10-CM | POA: Diagnosis not present

## 2020-10-11 DIAGNOSIS — G4700 Insomnia, unspecified: Secondary | ICD-10-CM | POA: Diagnosis not present

## 2020-10-11 DIAGNOSIS — Z1389 Encounter for screening for other disorder: Secondary | ICD-10-CM | POA: Diagnosis not present

## 2020-10-11 DIAGNOSIS — Z7189 Other specified counseling: Secondary | ICD-10-CM | POA: Diagnosis not present

## 2020-10-11 DIAGNOSIS — I5042 Chronic combined systolic (congestive) and diastolic (congestive) heart failure: Secondary | ICD-10-CM | POA: Diagnosis not present

## 2020-10-11 DIAGNOSIS — E440 Moderate protein-calorie malnutrition: Secondary | ICD-10-CM | POA: Diagnosis not present

## 2020-10-11 DIAGNOSIS — Z0000 Encounter for general adult medical examination without abnormal findings: Secondary | ICD-10-CM | POA: Diagnosis not present

## 2020-10-11 DIAGNOSIS — I700 Atherosclerosis of aorta: Secondary | ICD-10-CM | POA: Diagnosis not present

## 2020-10-12 ENCOUNTER — Ambulatory Visit (INDEPENDENT_AMBULATORY_CARE_PROVIDER_SITE_OTHER): Payer: PPO

## 2020-10-12 DIAGNOSIS — I5022 Chronic systolic (congestive) heart failure: Secondary | ICD-10-CM

## 2020-10-12 DIAGNOSIS — Z95810 Presence of automatic (implantable) cardiac defibrillator: Secondary | ICD-10-CM

## 2020-10-13 DIAGNOSIS — I428 Other cardiomyopathies: Secondary | ICD-10-CM | POA: Diagnosis not present

## 2020-10-13 DIAGNOSIS — I130 Hypertensive heart and chronic kidney disease with heart failure and stage 1 through stage 4 chronic kidney disease, or unspecified chronic kidney disease: Secondary | ICD-10-CM | POA: Diagnosis not present

## 2020-10-13 DIAGNOSIS — I5042 Chronic combined systolic (congestive) and diastolic (congestive) heart failure: Secondary | ICD-10-CM | POA: Diagnosis not present

## 2020-10-13 DIAGNOSIS — Z7901 Long term (current) use of anticoagulants: Secondary | ICD-10-CM | POA: Diagnosis not present

## 2020-10-13 DIAGNOSIS — I4819 Other persistent atrial fibrillation: Secondary | ICD-10-CM | POA: Diagnosis not present

## 2020-10-13 DIAGNOSIS — M1990 Unspecified osteoarthritis, unspecified site: Secondary | ICD-10-CM | POA: Diagnosis not present

## 2020-10-13 DIAGNOSIS — Z95810 Presence of automatic (implantable) cardiac defibrillator: Secondary | ICD-10-CM | POA: Diagnosis not present

## 2020-10-13 DIAGNOSIS — Z452 Encounter for adjustment and management of vascular access device: Secondary | ICD-10-CM | POA: Diagnosis not present

## 2020-10-13 DIAGNOSIS — E785 Hyperlipidemia, unspecified: Secondary | ICD-10-CM | POA: Diagnosis not present

## 2020-10-13 DIAGNOSIS — I472 Ventricular tachycardia: Secondary | ICD-10-CM | POA: Diagnosis not present

## 2020-10-13 DIAGNOSIS — I5043 Acute on chronic combined systolic (congestive) and diastolic (congestive) heart failure: Secondary | ICD-10-CM | POA: Diagnosis not present

## 2020-10-13 DIAGNOSIS — Z9181 History of falling: Secondary | ICD-10-CM | POA: Diagnosis not present

## 2020-10-13 DIAGNOSIS — I340 Nonrheumatic mitral (valve) insufficiency: Secondary | ICD-10-CM | POA: Diagnosis not present

## 2020-10-13 DIAGNOSIS — Z96642 Presence of left artificial hip joint: Secondary | ICD-10-CM | POA: Diagnosis not present

## 2020-10-13 DIAGNOSIS — Z79899 Other long term (current) drug therapy: Secondary | ICD-10-CM | POA: Diagnosis not present

## 2020-10-13 DIAGNOSIS — N1831 Chronic kidney disease, stage 3a: Secondary | ICD-10-CM | POA: Diagnosis not present

## 2020-10-13 NOTE — Progress Notes (Signed)
EPIC Encounter for ICM Monitoring  Patient Name: Cheryl Vargas is a 86 y.o. female Date: 10/13/2020 Primary Care Physican: Lavone Orn, MD Primary Cardiologist:McLean Electrophysiologist:Allred Bi-V Pacing:97.8% 2/2/2022Weight: 122lbs(114 lbs 2 weeks ago)   Spoke with patient and is symptomatic with SOB and 7 lb weight gain in last 2 weeks.  She is eating some foods brought in from restaurants.     Patient reports not sleeping well and canceled 10/14/2020 OV with Dr Aundra Dubin.  OptivolThoracic impedancesuggesting possible fluid accumulation starting 09/21/2020.  Prescribed:   Furosemide40 mg take1tablet (40 mg total)by mouthdaily.  Potassium 20 mEq take 1 tablet daily.  Labs: 09/22/2020 Creatinine 1.26, BUN 22, Potassium 3.7, Sodium 138, GFR 39-46 09/01/2020 Creatinine 1.35, BUN 27, Potassium 4.1, Sodium 138, GFR 36-42  08/23/2020 Creatinine 1.06, BUN 14, Potassium 3.9, Sodium 139, GFR 49-56  08/18/2020 Creatinine 1.21, BUN 19, Potassium 3.8, Sodium 136, GFR 44 08/17/2020 Creatinine 1.05, BUN 16, Potassium 3.5, Sodium 136, GFR 53  08/16/2020 Creatinine 1.10, BUN 16, Potassium 3.5, Sodium 135, GFR 50  08/15/2020 Creatinine 1.02, BUN 15, Potassium 4.2, Sodium 135, GFR 55  08/14/2020 Creatinine 1.04, BUN 14, Potassium 4.1, Sodium 135, GFR 53  08/13/2020 Creatinine 1.09, BUN 14, Potassium 4.6, Sodium 133, GFR 50  08/12/2020 Creatinine 1.14, BUN 16, Potassium 4.9, Sodium 134, GFR 48  08/11/2020 Creatinine 1.00, BUN 16, Potassium 4.6, Sodium 134, GFR 56  A complete set of results can be found in Results Review.  Recommendations: Copy to Dr Aundra Dubin for review and recommendations if needed.   Follow-up plan: ICM clinic phone appointment on2/04/2020 to recheck fluid levels. 91 day device clinic remote transmission3/30/2022.   EP/Cardiology Office Visits: 12-19-20 with Dr Aundra Dubin.10/29/2020 with Dr  Rayann Heman  Copy of ICM check sent to Dr.Allred.  3 month ICM trend: 10/12/2020.    1 Year ICM trend:       Rosalene Billings, RN 10/13/2020 10:26 AM

## 2020-10-13 NOTE — Progress Notes (Signed)
Increase Lasix to 40 mg bid x 5 days then 40 qam/20 qpm.  Increase KCl to 20 bid.  BMET in 1 week and arrange followup in APP clinic.

## 2020-10-13 NOTE — Progress Notes (Signed)
Spoke with patient.  Provided Dr Claris Gladden recommendations:  1. Increase Furosemide (Lasix) to 40 mg twice a day x 5 days.  2. After 5th day, start Lasix 40 mg every morning and 20 mg every evening.   3. Starting today, Increase Potassium starting to 20 mEq 1 tablet twice a day.   4. Will need BMET drawn next week.  [facility] is currently drawing labs at home due to she is receiving Milrinone.  She will ask [facility] today if they will draw a BET at home next week and will call back to let me know if she needs an office appointment for labs.   5. HF clinic will contact her to schedule an appointment with NP/PA.    Patient repeated all instruction back correctly.  She does not need any Lasix or Potassium refill at this time and has supply on hand for new dosages.

## 2020-10-14 ENCOUNTER — Encounter (HOSPITAL_COMMUNITY): Payer: PPO | Admitting: Cardiology

## 2020-10-14 ENCOUNTER — Telehealth (HOSPITAL_COMMUNITY): Payer: Self-pay | Admitting: *Deleted

## 2020-10-14 DIAGNOSIS — I5023 Acute on chronic systolic (congestive) heart failure: Secondary | ICD-10-CM | POA: Diagnosis not present

## 2020-10-14 NOTE — Telephone Encounter (Addendum)
Jennifer,RN with [facility] called to report she was unable to draw pts blood yesterday but another nurse can go out today if we fax an order to [facility]. Also needs to report pt fell two times yesterday, has diarrhea, and weak. Pt was not injured from fall pt said she has been drinking extra fluids since diarrhea started. No change in appetite. Order for labs faxed to [facility]. Per Dr.McLean's note from yesterday he wants pt to schedule an appt with APP clinic for next week. I called pt to schedule appt and she did not answer. I left vm requesting she call back to schedule appt with APP. Pt cancelled appt with DM today per Deniece Ree.    [phone]

## 2020-10-18 ENCOUNTER — Telehealth (HOSPITAL_COMMUNITY): Payer: Self-pay | Admitting: Cardiology

## 2020-10-18 MED ORDER — MAGNESIUM OXIDE -MG SUPPLEMENT 200 MG PO TABS: 200.0000 mg | ORAL_TABLET | Freq: Two times a day (BID) | ORAL | 11 refills | Status: DC

## 2020-10-18 MED ORDER — RIVAROXABAN 15 MG PO TABS: 15.0000 mg | ORAL_TABLET | Freq: Every day | ORAL | 11 refills | Status: AC

## 2020-10-18 NOTE — Telephone Encounter (Signed)
Abnormal labs received from [facility] drawn 10/14/20 Cr 1.3BUN 35 K 4.1 Mg 1.7 Hg 10.7 Hct 32.1   Per Jessica Milford,NP -is she having any bleeding? Follow CBC with next blood draw -is she taking mag ox 200 mg BID? If so increase to 400/200   Pt aware of results Reports she never started mag will start now [facility] to repeat labs later in the week

## 2020-10-19 ENCOUNTER — Telehealth (HOSPITAL_COMMUNITY): Payer: Self-pay | Admitting: *Deleted

## 2020-10-19 ENCOUNTER — Ambulatory Visit (INDEPENDENT_AMBULATORY_CARE_PROVIDER_SITE_OTHER): Payer: PPO

## 2020-10-19 DIAGNOSIS — I5022 Chronic systolic (congestive) heart failure: Secondary | ICD-10-CM

## 2020-10-19 DIAGNOSIS — Z95810 Presence of automatic (implantable) cardiac defibrillator: Secondary | ICD-10-CM

## 2020-10-19 NOTE — Telephone Encounter (Signed)
pts son [name] left VM requesting a call from provider about recent decline in patients health and coordinating care with [facility]. Pts son stated he works for [employer] but not in [employer] and has some concerns and questions for provider.  Call back # [phone]  Routed to [place]

## 2020-10-20 ENCOUNTER — Other Ambulatory Visit (HOSPITAL_COMMUNITY): Payer: Self-pay | Admitting: Cardiology

## 2020-10-20 DIAGNOSIS — N390 Urinary tract infection, site not specified: Secondary | ICD-10-CM | POA: Diagnosis not present

## 2020-10-20 DIAGNOSIS — I5023 Acute on chronic systolic (congestive) heart failure: Secondary | ICD-10-CM | POA: Diagnosis not present

## 2020-10-20 DIAGNOSIS — I5043 Acute on chronic combined systolic (congestive) and diastolic (congestive) heart failure: Secondary | ICD-10-CM | POA: Diagnosis not present

## 2020-10-21 ENCOUNTER — Other Ambulatory Visit (HOSPITAL_COMMUNITY): Payer: Self-pay | Admitting: *Deleted

## 2020-10-21 ENCOUNTER — Telehealth (HOSPITAL_COMMUNITY): Payer: Self-pay | Admitting: *Deleted

## 2020-10-21 DIAGNOSIS — I5042 Chronic combined systolic (congestive) and diastolic (congestive) heart failure: Secondary | ICD-10-CM | POA: Diagnosis not present

## 2020-10-21 DIAGNOSIS — Z452 Encounter for adjustment and management of vascular access device: Secondary | ICD-10-CM | POA: Diagnosis not present

## 2020-10-21 DIAGNOSIS — Z79899 Other long term (current) drug therapy: Secondary | ICD-10-CM | POA: Diagnosis not present

## 2020-10-21 DIAGNOSIS — I340 Nonrheumatic mitral (valve) insufficiency: Secondary | ICD-10-CM | POA: Diagnosis not present

## 2020-10-21 DIAGNOSIS — I472 Ventricular tachycardia: Secondary | ICD-10-CM | POA: Diagnosis not present

## 2020-10-21 DIAGNOSIS — Z7901 Long term (current) use of anticoagulants: Secondary | ICD-10-CM | POA: Diagnosis not present

## 2020-10-21 DIAGNOSIS — E785 Hyperlipidemia, unspecified: Secondary | ICD-10-CM | POA: Diagnosis not present

## 2020-10-21 DIAGNOSIS — I428 Other cardiomyopathies: Secondary | ICD-10-CM | POA: Diagnosis not present

## 2020-10-21 DIAGNOSIS — Z96642 Presence of left artificial hip joint: Secondary | ICD-10-CM | POA: Diagnosis not present

## 2020-10-21 DIAGNOSIS — Z95810 Presence of automatic (implantable) cardiac defibrillator: Secondary | ICD-10-CM | POA: Diagnosis not present

## 2020-10-21 DIAGNOSIS — N1831 Chronic kidney disease, stage 3a: Secondary | ICD-10-CM | POA: Diagnosis not present

## 2020-10-21 DIAGNOSIS — I4819 Other persistent atrial fibrillation: Secondary | ICD-10-CM | POA: Diagnosis not present

## 2020-10-21 DIAGNOSIS — I130 Hypertensive heart and chronic kidney disease with heart failure and stage 1 through stage 4 chronic kidney disease, or unspecified chronic kidney disease: Secondary | ICD-10-CM | POA: Diagnosis not present

## 2020-10-21 DIAGNOSIS — Z9181 History of falling: Secondary | ICD-10-CM | POA: Diagnosis not present

## 2020-10-21 DIAGNOSIS — M1990 Unspecified osteoarthritis, unspecified site: Secondary | ICD-10-CM | POA: Diagnosis not present

## 2020-10-21 MED ORDER — CIPROFLOXACIN HCL 250 MG PO TABS: 250.0000 mg | ORAL_TABLET | Freq: Two times a day (BID) | ORAL | 0 refills | Status: DC

## 2020-10-21 NOTE — Telephone Encounter (Signed)
Cipro sent to pharmacy RN aware.

## 2020-10-21 NOTE — Telephone Encounter (Signed)
Can take Cipro 250 bid x 3 days.

## 2020-10-21 NOTE — Telephone Encounter (Signed)
Cheryl Vargas w/[facility] called to report pt is positive for a UTI and asked that we send medication to treat it.  Call back #[phone]   Routed to [place]

## 2020-10-22 ENCOUNTER — Inpatient Hospital Stay (HOSPITAL_COMMUNITY)
Admission: EM | Admit: 2020-10-22 | Discharge: 2020-10-30 | DRG: 291 | Disposition: A | Discharge status: Home Health | Payer: PPO | Attending: Internal Medicine | Admitting: Internal Medicine

## 2020-10-22 ENCOUNTER — Emergency Department (HOSPITAL_COMMUNITY): Payer: PPO

## 2020-10-22 ENCOUNTER — Other Ambulatory Visit: Payer: Self-pay

## 2020-10-22 ENCOUNTER — Encounter (HOSPITAL_COMMUNITY): Payer: Self-pay

## 2020-10-22 DIAGNOSIS — I248 Other forms of acute ischemic heart disease: Secondary | ICD-10-CM | POA: Diagnosis not present

## 2020-10-22 DIAGNOSIS — I428 Other cardiomyopathies: Secondary | ICD-10-CM | POA: Diagnosis not present

## 2020-10-22 DIAGNOSIS — N179 Acute kidney failure, unspecified: Secondary | ICD-10-CM | POA: Diagnosis present

## 2020-10-22 DIAGNOSIS — E785 Hyperlipidemia, unspecified: Secondary | ICD-10-CM | POA: Diagnosis not present

## 2020-10-22 DIAGNOSIS — Z20822 Contact with and (suspected) exposure to covid-19: Secondary | ICD-10-CM | POA: Diagnosis present

## 2020-10-22 DIAGNOSIS — Z7189 Other specified counseling: Secondary | ICD-10-CM | POA: Diagnosis not present

## 2020-10-22 DIAGNOSIS — R54 Age-related physical debility: Secondary | ICD-10-CM | POA: Diagnosis present

## 2020-10-22 DIAGNOSIS — E43 Unspecified severe protein-calorie malnutrition: Secondary | ICD-10-CM | POA: Diagnosis present

## 2020-10-22 DIAGNOSIS — Z96652 Presence of left artificial knee joint: Secondary | ICD-10-CM | POA: Diagnosis present

## 2020-10-22 DIAGNOSIS — E860 Dehydration: Secondary | ICD-10-CM | POA: Diagnosis present

## 2020-10-22 DIAGNOSIS — G2581 Restless legs syndrome: Secondary | ICD-10-CM | POA: Diagnosis present

## 2020-10-22 DIAGNOSIS — I5043 Acute on chronic combined systolic (congestive) and diastolic (congestive) heart failure: Secondary | ICD-10-CM

## 2020-10-22 DIAGNOSIS — R198 Other specified symptoms and signs involving the digestive system and abdomen: Secondary | ICD-10-CM

## 2020-10-22 DIAGNOSIS — W19XXXA Unspecified fall, initial encounter: Secondary | ICD-10-CM | POA: Diagnosis present

## 2020-10-22 DIAGNOSIS — K449 Diaphragmatic hernia without obstruction or gangrene: Secondary | ICD-10-CM | POA: Diagnosis not present

## 2020-10-22 DIAGNOSIS — I509 Heart failure, unspecified: Secondary | ICD-10-CM

## 2020-10-22 DIAGNOSIS — Z515 Encounter for palliative care: Secondary | ICD-10-CM

## 2020-10-22 DIAGNOSIS — N1832 Chronic kidney disease, stage 3b: Secondary | ICD-10-CM | POA: Diagnosis not present

## 2020-10-22 DIAGNOSIS — I130 Hypertensive heart and chronic kidney disease with heart failure and stage 1 through stage 4 chronic kidney disease, or unspecified chronic kidney disease: Secondary | ICD-10-CM | POA: Diagnosis not present

## 2020-10-22 DIAGNOSIS — Z95810 Presence of automatic (implantable) cardiac defibrillator: Secondary | ICD-10-CM

## 2020-10-22 DIAGNOSIS — A419 Sepsis, unspecified organism: Secondary | ICD-10-CM | POA: Diagnosis not present

## 2020-10-22 DIAGNOSIS — I4821 Permanent atrial fibrillation: Secondary | ICD-10-CM | POA: Diagnosis not present

## 2020-10-22 DIAGNOSIS — S7002XA Contusion of left hip, initial encounter: Secondary | ICD-10-CM | POA: Diagnosis present

## 2020-10-22 DIAGNOSIS — I959 Hypotension, unspecified: Secondary | ICD-10-CM | POA: Diagnosis present

## 2020-10-22 DIAGNOSIS — Z7901 Long term (current) use of anticoagulants: Secondary | ICD-10-CM

## 2020-10-22 DIAGNOSIS — K5900 Constipation, unspecified: Secondary | ICD-10-CM | POA: Diagnosis present

## 2020-10-22 DIAGNOSIS — I493 Ventricular premature depolarization: Secondary | ICD-10-CM | POA: Diagnosis not present

## 2020-10-22 DIAGNOSIS — Z66 Do not resuscitate: Secondary | ICD-10-CM | POA: Diagnosis present

## 2020-10-22 DIAGNOSIS — I5084 End stage heart failure: Secondary | ICD-10-CM | POA: Diagnosis not present

## 2020-10-22 DIAGNOSIS — I10 Essential (primary) hypertension: Secondary | ICD-10-CM | POA: Diagnosis not present

## 2020-10-22 DIAGNOSIS — Z96642 Presence of left artificial hip joint: Secondary | ICD-10-CM | POA: Diagnosis not present

## 2020-10-22 DIAGNOSIS — E038 Other specified hypothyroidism: Secondary | ICD-10-CM | POA: Diagnosis not present

## 2020-10-22 DIAGNOSIS — J9811 Atelectasis: Secondary | ICD-10-CM | POA: Diagnosis not present

## 2020-10-22 DIAGNOSIS — I6782 Cerebral ischemia: Secondary | ICD-10-CM | POA: Diagnosis not present

## 2020-10-22 DIAGNOSIS — E875 Hyperkalemia: Secondary | ICD-10-CM | POA: Diagnosis not present

## 2020-10-22 DIAGNOSIS — R531 Weakness: Secondary | ICD-10-CM | POA: Diagnosis not present

## 2020-10-22 DIAGNOSIS — I480 Paroxysmal atrial fibrillation: Secondary | ICD-10-CM | POA: Diagnosis not present

## 2020-10-22 DIAGNOSIS — E876 Hypokalemia: Secondary | ICD-10-CM | POA: Diagnosis not present

## 2020-10-22 DIAGNOSIS — K5730 Diverticulosis of large intestine without perforation or abscess without bleeding: Secondary | ICD-10-CM | POA: Diagnosis present

## 2020-10-22 DIAGNOSIS — R296 Repeated falls: Secondary | ICD-10-CM | POA: Diagnosis not present

## 2020-10-22 DIAGNOSIS — K7689 Other specified diseases of liver: Secondary | ICD-10-CM | POA: Diagnosis present

## 2020-10-22 DIAGNOSIS — Z681 Body mass index (BMI) 19 or less, adult: Secondary | ICD-10-CM | POA: Diagnosis not present

## 2020-10-22 DIAGNOSIS — Z8249 Family history of ischemic heart disease and other diseases of the circulatory system: Secondary | ICD-10-CM

## 2020-10-22 DIAGNOSIS — R627 Adult failure to thrive: Secondary | ICD-10-CM | POA: Diagnosis not present

## 2020-10-22 DIAGNOSIS — L929 Granulomatous disorder of the skin and subcutaneous tissue, unspecified: Secondary | ICD-10-CM | POA: Diagnosis not present

## 2020-10-22 DIAGNOSIS — I482 Chronic atrial fibrillation, unspecified: Secondary | ICD-10-CM | POA: Diagnosis not present

## 2020-10-22 DIAGNOSIS — Z79899 Other long term (current) drug therapy: Secondary | ICD-10-CM

## 2020-10-22 DIAGNOSIS — Z888 Allergy status to other drugs, medicaments and biological substances status: Secondary | ICD-10-CM

## 2020-10-22 DIAGNOSIS — I5023 Acute on chronic systolic (congestive) heart failure: Secondary | ICD-10-CM | POA: Diagnosis not present

## 2020-10-22 LAB — MAGNESIUM: Magnesium: 1.5 mg/dL — ABNORMAL LOW (ref 1.7–2.4)

## 2020-10-22 LAB — CBC WITH DIFFERENTIAL/PLATELET
Abs Immature Granulocytes: 0.02 10*3/uL (ref 0.00–0.07)
Basophils Absolute: 0 10*3/uL (ref 0.0–0.1)
Basophils Relative: 1 %
Eosinophils Absolute: 0.1 10*3/uL (ref 0.0–0.5)
Eosinophils Relative: 1 %
HCT: 33.9 % — ABNORMAL LOW (ref 36.0–46.0)
Hemoglobin: 10.8 g/dL — ABNORMAL LOW (ref 12.0–15.0)
Immature Granulocytes: 0 %
Lymphocytes Relative: 12 %
Lymphs Abs: 0.7 10*3/uL (ref 0.7–4.0)
MCH: 26.5 pg (ref 26.0–34.0)
MCHC: 31.9 g/dL (ref 30.0–36.0)
MCV: 83.1 fL (ref 80.0–100.0)
Monocytes Absolute: 0.8 10*3/uL (ref 0.1–1.0)
Monocytes Relative: 13 %
Neutro Abs: 4.4 10*3/uL (ref 1.7–7.7)
Neutrophils Relative %: 73 %
Platelets: 224 10*3/uL (ref 150–400)
RBC: 4.08 MIL/uL (ref 3.87–5.11)
RDW: 16.1 % — ABNORMAL HIGH (ref 11.5–15.5)
WBC: 6 10*3/uL (ref 4.0–10.5)
nRBC: 0 % (ref 0.0–0.2)

## 2020-10-22 LAB — COMPREHENSIVE METABOLIC PANEL
ALT: 39 U/L (ref 0–44)
AST: 47 U/L — ABNORMAL HIGH (ref 15–41)
Albumin: 3.5 g/dL (ref 3.5–5.0)
Alkaline Phosphatase: 66 U/L (ref 38–126)
Anion gap: 15 (ref 5–15)
BUN: 50 mg/dL — ABNORMAL HIGH (ref 8–23)
CO2: 20 mmol/L — ABNORMAL LOW (ref 22–32)
Calcium: 9.1 mg/dL (ref 8.9–10.3)
Chloride: 100 mmol/L (ref 98–111)
Creatinine, Ser: 2.29 mg/dL — ABNORMAL HIGH (ref 0.44–1.00)
GFR, Estimated: 21 mL/min — ABNORMAL LOW (ref >60)
Glucose, Bld: 116 mg/dL — ABNORMAL HIGH (ref 70–99)
Potassium: 4.1 mmol/L (ref 3.5–5.1)
Sodium: 135 mmol/L (ref 135–145)
Total Bilirubin: 1.5 mg/dL — ABNORMAL HIGH (ref 0.3–1.2)
Total Protein: 7.5 g/dL (ref 6.5–8.1)

## 2020-10-22 LAB — TROPONIN I (HIGH SENSITIVITY)
Troponin I (High Sensitivity): 48 ng/L — ABNORMAL HIGH (ref <18)
Troponin I (High Sensitivity): 41 ng/L — ABNORMAL HIGH (ref <18)

## 2020-10-22 LAB — RESP PANEL BY RT-PCR (FLU A&B, COVID) ARPGX2
Influenza A by PCR: NEGATIVE
Influenza B by PCR: NEGATIVE
SARS Coronavirus 2 by RT PCR: NEGATIVE

## 2020-10-22 LAB — PROTIME-INR
INR: 2.9 — ABNORMAL HIGH (ref 0.8–1.2)
Prothrombin Time: 29.6 seconds — ABNORMAL HIGH (ref 11.4–15.2)

## 2020-10-22 LAB — LACTIC ACID, PLASMA: Lactic Acid, Venous: 1.6 mmol/L (ref 0.5–1.9)

## 2020-10-22 LAB — BRAIN NATRIURETIC PEPTIDE: B Natriuretic Peptide: 1503.8 pg/mL — ABNORMAL HIGH (ref 0.0–100.0)

## 2020-10-22 MED ORDER — AMIODARONE HCL 200 MG PO TABS
200.0000 mg | ORAL_TABLET | Freq: Every day | ORAL | Status: DC
  Administered 2020-10-23 – 2020-10-30 (×8): 200 mg via ORAL
  Filled 2020-10-22 (×9): qty 1

## 2020-10-22 MED ORDER — LORAZEPAM 1 MG PO TABS
0.5000 mg | ORAL_TABLET | Freq: Every day | ORAL | Status: DC | PRN
  Administered 2020-10-23: 0.5 mg via ORAL
  Filled 2020-10-22: qty 1

## 2020-10-22 MED ORDER — FUROSEMIDE 10 MG/ML IJ SOLN: 80.0000 mg | Freq: Once | INTRAMUSCULAR | Status: DC

## 2020-10-22 MED ORDER — MIDODRINE HCL 5 MG PO TABS
10.0000 mg | ORAL_TABLET | Freq: Two times a day (BID) | ORAL | Status: DC
  Administered 2020-10-23 – 2020-10-30 (×15): 10 mg via ORAL
  Filled 2020-10-22 (×17): qty 2

## 2020-10-22 MED ORDER — RIVAROXABAN 15 MG PO TABS
15.0000 mg | ORAL_TABLET | Freq: Every day | ORAL | Status: DC
  Administered 2020-10-23 – 2020-10-29 (×8): 15 mg via ORAL
  Filled 2020-10-22 (×10): qty 1

## 2020-10-22 MED ORDER — MAGNESIUM SULFATE 2 GM/50ML IV SOLN
2.0000 g | Freq: Once | INTRAVENOUS | Status: AC
  Administered 2020-10-23: 2 g via INTRAVENOUS
  Filled 2020-10-22: qty 50

## 2020-10-22 MED ORDER — ACETAMINOPHEN 500 MG PO TABS
500.0000 mg | ORAL_TABLET | Freq: Four times a day (QID) | ORAL | Status: DC | PRN
  Administered 2020-10-23: 500 mg via ORAL
  Filled 2020-10-22: qty 1

## 2020-10-22 MED ORDER — PANTOPRAZOLE SODIUM 40 MG PO TBEC
40.0000 mg | DELAYED_RELEASE_TABLET | Freq: Every day | ORAL | Status: DC
Start: 2020-10-22 — End: 2020-10-30
  Administered 2020-10-23 – 2020-10-29 (×7): 40 mg via ORAL
  Filled 2020-10-22 (×8): qty 1

## 2020-10-22 MED ORDER — MILRINONE LACTATE IN DEXTROSE 20-5 MG/100ML-% IV SOLN
0.3750 ug/kg/min | INTRAVENOUS | Status: DC
  Administered 2020-10-23 – 2020-10-25 (×3): 0.25 ug/kg/min via INTRAVENOUS
  Administered 2020-10-26 – 2020-10-27 (×4): 0.375 ug/kg/min via INTRAVENOUS
  Filled 2020-10-22 (×7): qty 100

## 2020-10-22 MED ORDER — SODIUM CHLORIDE 0.9 % IV SOLN: INTRAVENOUS | Status: AC

## 2020-10-22 MED ORDER — MAGNESIUM OXIDE 400 (241.3 MG) MG PO TABS
200.0000 mg | ORAL_TABLET | Freq: Two times a day (BID) | ORAL | Status: DC
  Administered 2020-10-23 – 2020-10-29 (×15): 200 mg via ORAL
  Filled 2020-10-22 (×18): qty 1

## 2020-10-22 MED ORDER — ONDANSETRON HCL 4 MG PO TABS
4.0000 mg | ORAL_TABLET | Freq: Two times a day (BID) | ORAL | Status: DC | PRN
  Administered 2020-10-24 (×2): 4 mg via ORAL
  Filled 2020-10-22 (×3): qty 1

## 2020-10-22 MED ORDER — FUROSEMIDE 40 MG PO TABS: ORAL_TABLET | ORAL | 3 refills | Status: DC

## 2020-10-22 MED ORDER — POTASSIUM CHLORIDE CRYS ER 20 MEQ PO TBCR
20.0000 meq | EXTENDED_RELEASE_TABLET | Freq: Two times a day (BID) | ORAL | Status: DC
  Administered 2020-10-23 – 2020-10-24 (×5): 20 meq via ORAL
  Filled 2020-10-22 (×5): qty 1

## 2020-10-22 MED ORDER — POTASSIUM CHLORIDE CRYS ER 20 MEQ PO TBCR: 20.0000 meq | EXTENDED_RELEASE_TABLET | Freq: Two times a day (BID) | ORAL | 3 refills | Status: DC

## 2020-10-22 MED ORDER — SENNOSIDES-DOCUSATE SODIUM 8.6-50 MG PO TABS
1.0000 | ORAL_TABLET | ORAL | Status: DC | PRN
  Administered 2020-10-25 – 2020-10-27 (×3): 1 via ORAL
  Filled 2020-10-22 (×3): qty 1

## 2020-10-22 NOTE — Telephone Encounter (Signed)
pts son left vm stating he had to take pt to the emergency room so do not send another antibiotic in at this time.

## 2020-10-22 NOTE — Consult Note (Signed)
Cardiology Consultation:   Patient ID: Cheryl Vargas MRN: [id number]; DOB: [birth year]  Admit date: 10/22/2020 Date of Consult: 10/22/2020  PCP:  Lavone Orn, MD   [place]  Cardiologist:  [name], MD  Advanced Practice Provider:  No care team member to display Electrophysiologist:  [name], MD      Patient Profile:   Cheryl Vargas is a 86 y.o. female with a hx of advanced heart failure who is being seen today for the evaluation of weakness, AKI on home milrinone at the request of Dr Tawanna Solo.  History of Present Illness:   [name] is an 86 year old female known well to Dr. Aundra Dubin with advanced heart failure, end-stage with multiple comorbidities, see below in assessment and plan for details, who comes in today with worsening weakness, malaise.  Dr. Aundra Dubin was able to speak with her son who states that she was taking additional Lasix over the past 3 to 4 days.  He thinks she may have a urinary tract infection.  She has had a long and complicated cardiac course.  Please see Dr. Claris Gladden note from 09/08/2020 for full details.  This note has been reviewed extensively.   Past Medical History:  Diagnosis Date  . Arthritis   . Chronic combined systolic (congestive) and diastolic (congestive) heart failure ([place])   . Mitral regurgitation   . Non-ischemic cardiomyopathy ([place])   . Persistent atrial fibrillation ([place])   . Ventricular tachycardia [facility])     Past Surgical History:  Procedure Laterality Date  . AV NODE ABLATION N/A 09/07/2017   Procedure: AV NODE ABLATION;  Surgeon: [name], MD;  Location: [place] [facility];  Service: Cardiovascular;  Laterality: N/A;  . BIV ICD INSERTION CRT-D N/A 09/07/2017   Procedure: BIV ICD INSERTION CRT-D;  Surgeon: [name], MD;  Location: [place] [facility];  Service: Cardiovascular;  Laterality: N/A;  . BUNIONECTOMY    . CARDIAC CATHETERIZATION     in 2004 at [facility] "Insignificant  blockage" per patient  . CARDIAC CATHETERIZATION N/A 04/25/2016   Procedure: Left Heart Cath and Coronary Angiography;  Surgeon: Nelva Bush, MD;  Location: [place] [facility];  Service: Cardiovascular;  Laterality: N/A;  . CARDIOVERSION N/A 06/29/2017   Procedure: CARDIOVERSION;  Surgeon: Larey Dresser, MD;  Location: [facility] ENDOSCOPY;  Service: Cardiovascular;  Laterality: N/A;  . CARDIOVERSION N/A 08/28/2017   Procedure: CARDIOVERSION;  Surgeon: Larey Dresser, MD;  Location: [place];  Service: Cardiovascular;  Laterality: N/A;  . IR ANGIOGRAM PULMONARY RIGHT SELECTIVE  08/04/2020  . IR ANGIOGRAM SELECTIVE EACH ADDITIONAL VESSEL  08/04/2020  . IR CT SPINE LTD  08/04/2020  . IR EMBO ART  VEN HEMORR LYMPH EXTRAV  INC GUIDE ROADMAPPING  08/04/2020  . IR FLUORO GUIDE CV LINE RIGHT  08/11/2020  . IR US GUIDE VASC ACCESS RIGHT  08/11/2020  . RADIOLOGY WITH ANESTHESIA N/A 08/04/2020   Procedure: IR WITH ANESTHESIA;  Surgeon: Arne Cleveland, MD;  Location: [place];  Service: Radiology;  Laterality: N/A;  . RIGHT HEART CATH N/A 09/29/2019   Procedure: RIGHT HEART CATH;  Surgeon: Larey Dresser, MD;  Location: [place] [facility];  Service: Cardiovascular;  Laterality: N/A;  . RIGHT HEART CATH N/A 08/04/2020   Procedure: RIGHT HEART CATH;  Surgeon: Larey Dresser, MD;  Location: [place] [facility];  Service: Cardiovascular;  Laterality: N/A;  . SHOULDER SURGERY     closed reduction  . TEE WITHOUT CARDIOVERSION N/A 06/29/2017  Procedure: TRANSESOPHAGEAL ECHOCARDIOGRAM (TEE);  Surgeon: Larey Dresser, MD;  Location: [facility] ENDOSCOPY;  Service: Cardiovascular;  Laterality: N/A;  . TONSILLECTOMY    . TOTAL HIP ARTHROPLASTY Left 03/18/2013   Procedure: TOTAL HIP ARTHROPLASTY ANTERIOR APPROACH;  Surgeon: Mauri Pole, MD;  Location: [facility];  Service: Orthopedics;  Laterality: Left;  . TUBAL LIGATION       Home Medications:  Prior to Admission medications   Medication Sig Start Date End Date  Taking? Authorizing Provider  acetaminophen (TYLENOL) 500 MG tablet Take 500-1,000 mg by mouth every 6 (six) hours as needed for headache (pain).   Yes [provider]  amiodarone (PACERONE) 200 MG tablet Take 1 tablet (200 mg total) by mouth daily. 08/18/20  Yes Clegg, [name] D, NP  Calcium Carbonate Antacid (TUMS ULTRA 1000 PO) Take 1,000 mg by mouth 2 (two) times daily as needed (heartburn/indigestion).   Yes [provider]  fluticasone (FLONASE) 50 MCG/ACT nasal spray Place 1 spray into both nostrils daily as needed for allergies or rhinitis (seasonal allergies).   Yes [provider]  furosemide (LASIX) 40 MG tablet Take 1 tablet (40 mg total) every morning and 0.5 tablet (20 mg total) every evening. Patient taking differently: Take 20-40 mg by mouth See admin instructions. Take 1 tablet (40 mg total) every morning and 0.5 tablet (20 mg total) every evening. 10/22/20  Yes Larey Dresser, MD  LORazepam (ATIVAN) 0.5 MG tablet Take 1 tablet (0.5 mg total) by mouth daily as needed (nausea). Patient taking differently: Take 0.5 mg by mouth daily as needed for anxiety (nausea). 02/06/20  Yes Bonnielee Haff, MD  midodrine (PROAMATINE) 10 MG tablet Take 10 mg by mouth 2 (two) times daily with a meal.   Yes [provider]  ondansetron (ZOFRAN) 4 MG tablet Take 1 tablet (4 mg total) by mouth 2 (two) times daily as needed for nausea or vomiting. 08/18/20 08/18/21 Yes Larey Dresser, MD  pantoprazole (PROTONIX) 40 MG tablet Take 1 tablet (40 mg total) by mouth daily. 08/18/20  Yes Clegg, [name] D, NP  Rivaroxaban (XARELTO) 15 MG TABS tablet Take 1 tablet (15 mg total) by mouth daily with supper. 10/18/20  Yes Milford, Maricela Bo, FNP  sennosides-docusate sodium (SENOKOT-S) 8.6-50 MG tablet Take 1 tablet by mouth daily as needed for constipation.   Yes [provider]  zolpidem (AMBIEN) 10 MG tablet Take 10 mg by mouth at bedtime. 10/14/20  Yes [provider]   ciprofloxacin (CIPRO) 250 MG tablet Take 1 tablet (250 mg total) by mouth 2 (two) times daily. 10/21/20   Larey Dresser, MD  magnesium oxide (MAG-OX) 400 MG tablet Take 400 mg by mouth daily. 10/20/20   [provider]  Magnesium Oxide 200 MG TABS Take 1 tablet (200 mg total) by mouth 2 (two) times daily. 10/18/20   Milford, Maricela Bo, FNP  milrinone (PRIMACOR) 20 MG/100 ML SOLN infusion Inject 0.0149 mg/min into the vein continuous. Per [place] 08/18/20   Darrick Grinder D, NP  potassium chloride SA (KLOR-CON M20) 20 MEQ tablet Take 1 tablet (20 mEq total) by mouth 2 (two) times daily. 10/22/20 01/20/21  Larey Dresser, MD  zolpidem (AMBIEN) 5 MG tablet Take 1 tablet (5 mg total) by mouth at bedtime. Patient not taking: Reported on 10/22/2020 02/06/20   Bonnielee Haff, MD    Inpatient Medications: Scheduled Meds: . amiodarone  200 mg Oral Daily  . furosemide  80 mg Intravenous Once  .  Magnesium Oxide  200 mg Oral BID  . midodrine  10 mg Oral BID  . pantoprazole  40 mg Oral Daily  . potassium chloride SA  20 mEq Oral BID  . Rivaroxaban  15 mg Oral Q supper   Continuous Infusions: . magnesium sulfate bolus IVPB     PRN Meds: acetaminophen, LORazepam, ondansetron, sennosides-docusate sodium  Allergies:    Allergies  Allergen Reactions  . Sotalol Other (See Comments)    Prolonged QTc  . Alendronate Sodium Other (See Comments)    Arm pain  . Compazine [Prochlorperazine] Other (See Comments)    Nervous reaction  . Dofetilide Nausea Only    Reported by [facility] physicians  . Lisinopril Other (See Comments)    Low bp  . Pantoprazole Sodium Diarrhea  . Zolpidem Tartrate Er Other (See Comments)    Cannot take extended release - makes her too tired the next day  . Ciprofloxacin Other (See Comments)    Not effective  . Coreg [Carvedilol] Other (See Comments)    Fatigue   . Metoprolol Other (See Comments)    Profound fatigue    Social History:   Social History    Socioeconomic History  . Marital status: Widowed    Spouse name: Not on file  . Number of children: Not on file  . Years of education: Not on file  . Highest education level: Not on file  Occupational History  . Not on file  Tobacco Use  . Smoking status: Never Smoker  . Smokeless tobacco: Never Used  Vaping Use  . Vaping Use: Never used  Substance and Sexual Activity  . Alcohol use: Yes    Alcohol/week: 3.0 standard drinks    Types: 3 Glasses of wine per week    Comment: per week  . Drug use: No  . Sexual activity: Not on file  Other Topics Concern  . Not on file  Social History Narrative  . Not on file   Social Determinants of Health   Financial Resource Strain: Not on file  Food Insecurity: Not on file  Transportation Needs: Not on file  Physical Activity: Not on file  Stress: Not on file  Social Connections: Not on file  Intimate Partner Violence: Not on file    Family History:    Family History  Problem Relation Age of Onset  . Lung cancer Mother   . Heart attack Father      ROS:  Please see the history of present illness.  All other ROS reviewed and negative.     Physical Exam/Data:   Vitals:   10/22/20 1430 10/22/20 1445 10/22/20 1515 10/22/20 1616  BP: (!) 113/58 126/71 121/69 124/70  Pulse: 80 79 81 82  Resp: 14 19 16 16   Temp:    98 F (36.7 C)  TempSrc:    Oral  SpO2: 98% 93% 95% 96%  Weight:      Height:       No intake or output data in the 24 hours ending 10/22/20 1749 Last 3 Weights 10/22/2020 09/08/2020 08/18/2020  Weight (lbs) 115 lb 15.4 oz 116 lb 128 lb 1.4 oz  Weight (kg) 52.6 kg 52.617 kg 58.1 kg     Body mass index is 19.3 kg/m.  General: Thin, ill-appearing HEENT: normal Lymph: no adenopathy Neck: no JVD Endocrine:  No thryomegaly Vascular: No carotid bruits; FA pulses 2+ bilaterally without bruits  Cardiac:  normal S1, S2; RRR; 2/6 holosystolic murmur Lungs:  clear to auscultation bilaterally,  no wheezing, rhonchi or  rales  Abd: soft, nontender, no hepatomegaly  Ext: no edema Musculoskeletal:  No deformities, BUE and BLE strength normal and equal Skin: warm and dry  Neuro:  CNs 2-12 intact, no focal abnormalities noted Psych:  Normal affect    Laboratory Data:  High Sensitivity Troponin:   Recent Labs  Lab 10/22/20 1339 10/22/20 1513  TROPONINIHS 48* 41*     Chemistry Recent Labs  Lab 10/22/20 1227  NA 135  K 4.1  CL 100  CO2 20*  GLUCOSE 116*  BUN 50*  CREATININE 2.29*  CALCIUM 9.1  GFRNONAA 21*  ANIONGAP 15    Recent Labs  Lab 10/22/20 1227  PROT 7.5  ALBUMIN 3.5  AST 47*  ALT 39  ALKPHOS 66  BILITOT 1.5*   Hematology Recent Labs  Lab 10/22/20 1227  WBC 6.0  RBC 4.08  HGB 10.8*  HCT 33.9*  MCV 83.1  MCH 26.5  MCHC 31.9  RDW 16.1*  PLT 224   BNP Recent Labs  Lab 10/22/20 1339  BNP 1,503.8*    DDimer No results for input(s): DDIMER in the last 168 hours.   Radiology/Studies:  CT ABDOMEN PELVIS WO CONTRAST  Result Date: 10/22/2020 CLINICAL DATA:  Multiple falls x1 week with weakness on anticoagulants. EXAM: CT ABDOMEN AND PELVIS WITHOUT CONTRAST TECHNIQUE: Multidetector CT imaging of the abdomen and pelvis was performed following the standard protocol without IV contrast. COMPARISON:  Feb 03, 2020 FINDINGS: Lower chest: No acute abnormality. Hepatobiliary: Stable cystic appearing areas are seen within the anterior aspect of the right lobe of the liver and medial aspect of the left lobe. No gallstones, gallbladder wall thickening, or biliary dilatation. Pancreas: Unremarkable. No pancreatic ductal dilatation or surrounding inflammatory changes. Spleen: Normal in size without focal abnormality. Adrenals/Urinary Tract: Adrenal glands are unremarkable. Kidneys are normal in size, without renal calculi or hydronephrosis. A stable 8.3 cm x 6.6 cm mildly complex cyst is seen within the right kidney. Bladder is limited in evaluation secondary to overlying streak  artifact. Stomach/Bowel: There is a small hiatal hernia. Appendix appears normal. No evidence of bowel wall thickening, distention, or inflammatory changes. Noninflamed diverticula are seen throughout the sigmoid colon. Vascular/Lymphatic: Marked severity aortic calcification and atherosclerosis. No enlarged abdominal or pelvic lymph nodes. Reproductive: The expected region of the uterus and bilateral adnexa is limited in evaluation secondary to overlying streak artifact. Other: No abdominal wall hernia or abnormality. No abdominopelvic ascites. Musculoskeletal: A total left hip replacement is seen. An extensive amount of associated streak artifact is noted with subsequently limited evaluation of the adjacent osseous and soft tissue structures. Multilevel degenerative changes seen throughout the lumbar spine. IMPRESSION: 1. Limited evaluation of the pelvis secondary to overlying streak artifact. 2. Stable cystic appearing areas within the liver and right kidney. 3. Sigmoid diverticulosis. 4. Small hiatal hernia. 5. Total left hip replacement. 6. Aortic atherosclerosis. Aortic Atherosclerosis (ICD10-I70.0). Electronically Signed   By: Virgina Norfolk M.D.   On: 10/22/2020 15:57   DG Chest 2 View  Result Date: 10/22/2020 CLINICAL DATA:  Suspected sepsis. EXAM: CHEST - 2 VIEW COMPARISON:  08/02/2020 FINDINGS: Right jugular central venous catheter tip in the lower SVC. AICD in satisfactory position and unchanged. Interval placement of coils in the pulmonary vascularity posterior to the left atrium for pseudoaneurysm of the pulmonary artery. Mild bibasilar atelectasis similar to the prior study. Negative for pleural effusion. IMPRESSION: Mild bibasilar atelectasis unchanged from the prior study. Electronically Signed   By: Juanda Crumble  Carlis Abbott M.D.   On: 10/22/2020 12:56   CT Head Wo Contrast  Result Date: 10/22/2020 CLINICAL DATA:  Multiple falls last week.  On anticoagulation. EXAM: CT HEAD WITHOUT CONTRAST  TECHNIQUE: Contiguous axial images were obtained from the base of the skull through the vertex without intravenous contrast. COMPARISON:  Brain MRI, 12/01/2015. FINDINGS: Brain: No evidence of acute infarction, hemorrhage, hydrocephalus, extra-axial collection or mass lesion/mass effect. There is mild ventricular enlargement consistent with age appropriate volume loss. Mild periventricular white matter hypoattenuation is also noted consistent with chronic microvascular ischemic change. Vascular: No hyperdense vessel or unexpected calcification. Skull: Normal. Negative for fracture or focal lesion. Sinuses/Orbits: Globes and orbits are unremarkable. Sinuses are clear. Other: None. IMPRESSION: 1. No acute intracranial abnormalities. 2. Age-appropriate volume loss and mild chronic microvascular ischemic change. Electronically Signed   By: Lajean Manes M.D.   On: 10/22/2020 15:50     Assessment and Plan:   End-stage chronic systolic heart failure with nonischemic cardiomyopathy EF 20 to 25% with severe mitral gravitation severe tricuspid regurgitation and moderate aortic insufficiency -Class III-IV symptoms.  On home milrinone. -She is off of her losartan, bisoprolol, spironolactone secondary to symptomatic hypotension and is on midodrine 10 mg twice daily. -Continue with milrinone 0.25 mcg/kg/min, no changes made. -She is off of digoxin secondary to elevated levels. -She is not a candidate for advanced therapy such as LVAD. -I discussed her case with Dr. [name].  Plan previously was to continue with palliative home milrinone and if worsening occurred to transition to hospice.  Recommendation would be to consult hospice/palliative care for transition if clinical status continues to deteriorate.  If this truly is just a urinary tract infection of course this can be treated and hopefully her symptoms can be temporized and reversed.  Permanent atrial fibrillation -Status post AV nodal ablation with  BiV pacing -On Xarelto 15 mg a day.  PVC/VT -Previously frequent PVCs have limited BiV pacing.  She cannot tolerate sotalol or Tikosyn.  Could not tolerate mexiletine because of nausea.  She also had nausea with rhythm no wheezing.  Seen to be able to tolerate low-dose amiodarone.  Continue.  Mitral regurgitation -Severe on most recent echocardiogram.  Not a candidate for intervention.  Acute kidney injury -Worsening renal function, increased BUN.  Overall deterioration of clinical status. -Dr. Aundra Dubin was able to speak with her son who states that over the last few days she has taken additional Lasix.  Perhaps she is intravascularly volume depleted which coincides with her elevation in BUN and creatinine.  She may have a urinary tract infection according to her son. -Recommendation is to hold off on Lasix.  I will cancel the ER order. -Gentle hydration overnight perhaps 50 mL/h normal saline.   For questions or updates, please contact [place] Please consult [email or web address] for contact info under    Signed, Candee Furbish, MD  10/22/2020 5:49 PM

## 2020-10-22 NOTE — ED Notes (Signed)
Pt is refusing medications. States that she already took them.

## 2020-10-22 NOTE — ED Triage Notes (Signed)
Pt reports generalized weakness with multiple falls in the past few weeks. Pt also reports her [facility] RN said she had a UTI, pt denies any urinary s/s and is not on any antibiotics. Pt appears weak. Pt has milrinone gtt, admitted to hospital in Dec and hasnt seen cardiologist since. Denies pain, reports some increased sob than normal. Resp e.u at this time

## 2020-10-22 NOTE — Telephone Encounter (Signed)
pts son left vm stating pt can not take cipro and she needs a different antibiotic. I called him back to see if pt had an allergy to cipro/why she cant take it. No answer/left vm requesting return call.  Pts son [name] [phone]

## 2020-10-22 NOTE — H&P (Signed)
History and Physical    Cheryl Vargas JQB:[id number] DOB: [birth year] DOA: 10/22/2020  PCP: Lavone Orn, MD   Patient coming from: Home    Chief Complaint: Weakness, bilateral lower extremity swelling  HPI: Cheryl Vargas is a 86 y.o. female with medical history of nonischemic cardiomyopathy, chronic systolic congestive heart failure with ejection fraction of 20 to 25%, proximal A. fib, hyperlipidemia, hypertension, severe mitral valve regurgitation, CKD stage IIIb, Status post total left knee replacement who presents to the emergency department with complaints of generalized weakness, bilateral lower extremity swelling.  Patient states that she has been more fatigued than usual and also fell few times at home.  She follows with heart failure clinic.  She is on milrinone drip at home.  She follows with home health.  Patient also reported that she had a weight gain of 2 pounds in the last week.  She was also suspected to have UTI recently and was prescribed antibiotic but has not got it yet.She denies any urinary symptoms There was no report of fever, chills, shortness of breath, cough, chest pain, abdominal pain, nausea, vomiting or diarrhea.  She has been fully vaccinated. She was last admitted under heart failure team on 08/02/2020 and was discharged on 08/18/2020.  ED Course: Patient was hemodynamically stable on presentation.  Found to have bilateral lower extremity edema.  Lab work showed AKI on CKD, hypomagnesemia, elevated BNP, mildly  elevated troponin.  Chest x-ray showed bibasilar atelectasis, no pneumonia.  Heart failure team consulted.  Started on IV Lasix and resumed home milrinone.  Review of Systems: As per HPI otherwise 10 point review of systems negative.    Past Medical History:  Diagnosis Date  . Arthritis   . Chronic combined systolic (congestive) and diastolic (congestive) heart failure ([place])   . Mitral regurgitation   . Non-ischemic cardiomyopathy ([place])   .  Persistent atrial fibrillation ([place])   . Ventricular tachycardia [facility])     Past Surgical History:  Procedure Laterality Date  . AV NODE ABLATION N/A 09/07/2017   Procedure: AV NODE ABLATION;  Surgeon: [name], MD;  Location: [place] CV LAB;  Service: Cardiovascular;  Laterality: N/A;  . BIV ICD INSERTION CRT-D N/A 09/07/2017   Procedure: BIV ICD INSERTION CRT-D;  Surgeon: [name], MD;  Location: [place] [facility];  Service: Cardiovascular;  Laterality: N/A;  . BUNIONECTOMY    . CARDIAC CATHETERIZATION     in 2004 at [facility]. "Insignificant blockage" per patient  . CARDIAC CATHETERIZATION N/A 04/25/2016   Procedure: Left Heart Cath and Coronary Angiography;  Surgeon: Nelva Bush, MD;  Location: [place] CV LAB;  Service: Cardiovascular;  Laterality: N/A;  . CARDIOVERSION N/A 06/29/2017   Procedure: CARDIOVERSION;  Surgeon: Larey Dresser, MD;  Location: [facility] ENDOSCOPY;  Service: Cardiovascular;  Laterality: N/A;  . CARDIOVERSION N/A 08/28/2017   Procedure: CARDIOVERSION;  Surgeon: Larey Dresser, MD;  Location: [place];  Service: Cardiovascular;  Laterality: N/A;  . IR ANGIOGRAM PULMONARY RIGHT SELECTIVE  08/04/2020  . IR ANGIOGRAM SELECTIVE EACH ADDITIONAL VESSEL  08/04/2020  . IR CT SPINE LTD  08/04/2020  . IR EMBO ART  VEN HEMORR LYMPH EXTRAV  INC GUIDE ROADMAPPING  08/04/2020  . IR FLUORO GUIDE CV LINE RIGHT  08/11/2020  . IR US GUIDE VASC ACCESS RIGHT  08/11/2020  . RADIOLOGY WITH ANESTHESIA N/A 08/04/2020   Procedure: IR WITH ANESTHESIA;  Surgeon: Arne Cleveland, MD;  Location: [place];  Service: Radiology;  Laterality: N/A;  .  RIGHT HEART CATH N/A 09/29/2019   Procedure: RIGHT HEART CATH;  Surgeon: Larey Dresser, MD;  Location: [place] CV LAB;  Service: Cardiovascular;  Laterality: N/A;  . RIGHT HEART CATH N/A 08/04/2020   Procedure: RIGHT HEART CATH;  Surgeon: Larey Dresser, MD;  Location: [place] CV LAB;  Service: Cardiovascular;   Laterality: N/A;  . SHOULDER SURGERY     closed reduction  . TEE WITHOUT CARDIOVERSION N/A 06/29/2017   Procedure: TRANSESOPHAGEAL ECHOCARDIOGRAM (TEE);  Surgeon: Larey Dresser, MD;  Location: [facility] ENDOSCOPY;  Service: Cardiovascular;  Laterality: N/A;  . TONSILLECTOMY    . TOTAL HIP ARTHROPLASTY Left 03/18/2013   Procedure: TOTAL HIP ARTHROPLASTY ANTERIOR APPROACH;  Surgeon: Mauri Pole, MD;  Location: [facility];  Service: Orthopedics;  Laterality: Left;  . TUBAL LIGATION       reports that she has never smoked. She has never used smokeless tobacco. She reports current alcohol use of about 3.0 standard drinks of alcohol per week. She reports that she does not use drugs.  Allergies  Allergen Reactions  . Sotalol Other (See Comments)    Prolonged QTc  . Alendronate Sodium Other (See Comments)    Arm pain  . Compazine [Prochlorperazine] Other (See Comments)    Nervous reaction  . Dofetilide Nausea Only    Reported by [facility] physicians  . Lisinopril Other (See Comments)    Low bp  . Zolpidem Tartrate Er Other (See Comments)    Cannot take extended release - makes her too tired the next day  . Ciprofloxacin Other (See Comments)    Not effective  . Coreg [Carvedilol] Other (See Comments)    Fatigue   . Metoprolol Other (See Comments)    Profound fatigue    Family History  Problem Relation Age of Onset  . Lung cancer Mother   . Heart attack Father      Prior to Admission medications   Medication Sig Start Date End Date Taking? Authorizing Provider  acetaminophen (TYLENOL) 500 MG tablet Take 500 mg by mouth every 6 (six) hours as needed for headache.    [provider]  amiodarone (PACERONE) 200 MG tablet Take 1 tablet (200 mg total) by mouth daily. 08/18/20   Clegg, [name] D, NP  calcium carbonate (TUMS - DOSED IN MG ELEMENTAL CALCIUM) 500 MG chewable tablet Chew 1 tablet by mouth daily as needed for indigestion or heartburn.    [provider]  ciprofloxacin  (CIPRO) 250 MG tablet Take 1 tablet (250 mg total) by mouth 2 (two) times daily. 10/21/20   Larey Dresser, MD  fluticasone [facility]) 50 MCG/ACT nasal spray Place 1 spray into both nostrils daily as needed for allergies or rhinitis.     [provider]  furosemide (LASIX) 40 MG tablet Take 1 tablet (40 mg total) every morning and 0.5 tablet (20 mg total) every evening. 10/22/20   Larey Dresser, MD  LORazepam (ATIVAN) 0.5 MG tablet Take 1 tablet (0.5 mg total) by mouth daily as needed (nausea). 02/06/20   Bonnielee Haff, MD  Magnesium Oxide 200 MG TABS Take 1 tablet (200 mg total) by mouth 2 (two) times daily. 10/18/20   [place], Maricela Bo, FNP  midodrine (PROAMATINE) 10 MG tablet Take 10 mg by mouth 2 (two) times daily.    [provider]  milrinone (PRIMACOR) 20 MG/100 ML SOLN infusion Inject 0.0149 mg/min into the vein continuous. Per [place] 08/18/20   Conrad [place], NP  ondansetron (  ZOFRAN) 4 MG tablet Take 1 tablet (4 mg total) by mouth 2 (two) times daily as needed for nausea or vomiting. 08/18/20 08/18/21  Larey Dresser, MD  pantoprazole (PROTONIX) 40 MG tablet Take 1 tablet (40 mg total) by mouth daily. 08/18/20   Clegg, [name] D, NP  potassium chloride SA (KLOR-CON M20) 20 MEQ tablet Take 1 tablet (20 mEq total) by mouth 2 (two) times daily. 10/22/20 01/20/21  Larey Dresser, MD  Rivaroxaban (XARELTO) 15 MG TABS tablet Take 1 tablet (15 mg total) by mouth daily with supper. 10/18/20   [place], Maricela Bo, FNP  sennosides-docusate sodium (SENOKOT-S) 8.6-50 MG tablet Take 1 tablet by mouth as needed for constipation.    [provider]  zolpidem (AMBIEN) 5 MG tablet Take 1 tablet (5 mg total) by mouth at bedtime. Patient taking differently: Take 7.5 mg by mouth at bedtime. 02/06/20   Bonnielee Haff, MD    Physical Exam: Vitals:   10/22/20 1430 10/22/20 1445 10/22/20 1515 10/22/20 1616  BP: (!) 113/58 126/71 121/69 124/70  Pulse: 80 79 81 82  Resp: 14 19 16 16    Temp:    98 F (36.7 C)  TempSrc:    Oral  SpO2: 98% 93% 95% 96%  Weight:      Height:        Constitutional: Not in distress, chronically looking, very pleasant elderly female Vitals:   10/22/20 1430 10/22/20 1445 10/22/20 1515 10/22/20 1616  BP: (!) 113/58 126/71 121/69 124/70  Pulse: 80 79 81 82  Resp: 14 19 16 16   Temp:    98 F (36.7 C)  TempSrc:    Oral  SpO2: 98% 93% 95% 96%  Weight:      Height:       Eyes: PERRL, lids and conjunctivae normal ENMT: Mucous membranes are moist.  Neck: normal, supple, no masses, no thyromegaly Respiratory: clear to auscultation bilaterally, no wheezing, no crackles. Normal respiratory effort. No accessory muscle use.  Cardiovascular: Regular rate and rhythm, no murmurs / rubs / gallops. 1-2 + bilateral lower extremity edema, pacemaker, central line on the right chest Abdomen: no tenderness, no masses palpated. No hepatosplenomegaly. Bowel sounds positive.  Musculoskeletal: no clubbing / cyanosis. No joint deformity upper and lower extremities.  Skin: no rashes, lesions, ulcers. No induration Neurologic: CN 2-12 grossly intact.  Strength 5/5 in all 4.  Psychiatric: Normal judgment and insight. Alert and oriented x 3. Normal mood.   Foley Catheter:None  Labs on Admission: I have personally reviewed following labs and imaging studies  CBC: Recent Labs  Lab 10/22/20 1227  WBC 6.0  NEUTROABS 4.4  HGB 10.8*  HCT 33.9*  MCV 83.1  PLT 008   Basic Metabolic Panel: Recent Labs  Lab 10/22/20 1227 10/22/20 1513  NA 135  --   K 4.1  --   CL 100  --   CO2 20*  --   GLUCOSE 116*  --   BUN 50*  --   CREATININE 2.29*  --   CALCIUM 9.1  --   MG  --  1.5*   GFR: Estimated Creatinine Clearance: 15.5 mL/min (A) (by C-G formula based on SCr of 2.29 mg/dL (H)). Liver Function Tests: Recent Labs  Lab 10/22/20 1227  AST 47*  ALT 39  ALKPHOS 66  BILITOT 1.5*  PROT 7.5  ALBUMIN 3.5   No results for input(s): LIPASE, AMYLASE in  the last 168 hours. No results for input(s): AMMONIA in the last 168  hours. Coagulation Profile: Recent Labs  Lab 10/22/20 1227  INR 2.9*   Cardiac Enzymes: No results for input(s): CKTOTAL, CKMB, CKMBINDEX, TROPONINI in the last 168 hours. BNP (last 3 results) No results for input(s): PROBNP in the last 8760 hours. HbA1C: No results for input(s): HGBA1C in the last 72 hours. CBG: No results for input(s): GLUCAP in the last 168 hours. Lipid Profile: No results for input(s): CHOL, HDL, LDLCALC, TRIG, CHOLHDL, LDLDIRECT in the last 72 hours. Thyroid Function Tests: No results for input(s): TSH, T4TOTAL, FREET4, T3FREE, THYROIDAB in the last 72 hours. Anemia Panel: No results for input(s): VITAMINB12, FOLATE, FERRITIN, TIBC, IRON, RETICCTPCT in the last 72 hours. Urine analysis:    Component Value Date/Time   COLORURINE ORANGE (A) 02/01/2020 1407   APPEARANCEUR CLOUDY (A) 02/01/2020 1407   LABSPEC 1.020 02/01/2020 1407   PHURINE 5.0 02/01/2020 1407   GLUCOSEU NEGATIVE 02/01/2020 1407   HGBUR TRACE (A) 02/01/2020 1407   BILIRUBINUR SMALL (A) 02/01/2020 1407   KETONESUR 15 (A) 02/01/2020 1407   PROTEINUR 100 (A) 02/01/2020 1407   UROBILINOGEN 0.2 03/17/2013 1423   NITRITE POSITIVE (A) 02/01/2020 1407   LEUKOCYTESUR MODERATE (A) 02/01/2020 1407    Radiological Exams on Admission: CT ABDOMEN PELVIS WO CONTRAST  Result Date: 10/22/2020 CLINICAL DATA:  Multiple falls x1 week with weakness on anticoagulants. EXAM: CT ABDOMEN AND PELVIS WITHOUT CONTRAST TECHNIQUE: Multidetector CT imaging of the abdomen and pelvis was performed following the standard protocol without IV contrast. COMPARISON:  Feb 03, 2020 FINDINGS: Lower chest: No acute abnormality. Hepatobiliary: Stable cystic appearing areas are seen within the anterior aspect of the right lobe of the liver and medial aspect of the left lobe. No gallstones, gallbladder wall thickening, or biliary dilatation. Pancreas: Unremarkable.  No pancreatic ductal dilatation or surrounding inflammatory changes. Spleen: Normal in size without focal abnormality. Adrenals/Urinary Tract: Adrenal glands are unremarkable. Kidneys are normal in size, without renal calculi or hydronephrosis. A stable 8.3 cm x 6.6 cm mildly complex cyst is seen within the right kidney. Bladder is limited in evaluation secondary to overlying streak artifact. Stomach/Bowel: There is a small hiatal hernia. Appendix appears normal. No evidence of bowel wall thickening, distention, or inflammatory changes. Noninflamed diverticula are seen throughout the sigmoid colon. Vascular/Lymphatic: Marked severity aortic calcification and atherosclerosis. No enlarged abdominal or pelvic lymph nodes. Reproductive: The expected region of the uterus and bilateral adnexa is limited in evaluation secondary to overlying streak artifact. Other: No abdominal wall hernia or abnormality. No abdominopelvic ascites. Musculoskeletal: A total left hip replacement is seen. An extensive amount of associated streak artifact is noted with subsequently limited evaluation of the adjacent osseous and soft tissue structures. Multilevel degenerative changes seen throughout the lumbar spine. IMPRESSION: 1. Limited evaluation of the pelvis secondary to overlying streak artifact. 2. Stable cystic appearing areas within the liver and right kidney. 3. Sigmoid diverticulosis. 4. Small hiatal hernia. 5. Total left hip replacement. 6. Aortic atherosclerosis. Aortic Atherosclerosis (ICD10-I70.0). Electronically Signed   By: Virgina Norfolk M.D.   On: 10/22/2020 15:57   DG Chest 2 View  Result Date: 10/22/2020 CLINICAL DATA:  Suspected sepsis. EXAM: CHEST - 2 VIEW COMPARISON:  08/02/2020 FINDINGS: Right jugular central venous catheter tip in the lower SVC. AICD in satisfactory position and unchanged. Interval placement of coils in the pulmonary vascularity posterior to the left atrium for pseudoaneurysm of the pulmonary  artery. Mild bibasilar atelectasis similar to the prior study. Negative for pleural effusion. IMPRESSION: Mild bibasilar atelectasis unchanged  from the prior study. Electronically Signed   By: Franchot Gallo M.D.   On: 10/22/2020 12:56   CT Head Wo Contrast  Result Date: 10/22/2020 CLINICAL DATA:  Multiple falls last week.  On anticoagulation. EXAM: CT HEAD WITHOUT CONTRAST TECHNIQUE: Contiguous axial images were obtained from the base of the skull through the vertex without intravenous contrast. COMPARISON:  Brain MRI, 12/01/2015. FINDINGS: Brain: No evidence of acute infarction, hemorrhage, hydrocephalus, extra-axial collection or mass lesion/mass effect. There is mild ventricular enlargement consistent with age appropriate volume loss. Mild periventricular white matter hypoattenuation is also noted consistent with chronic microvascular ischemic change. Vascular: No hyperdense vessel or unexpected calcification. Skull: Normal. Negative for fracture or focal lesion. Sinuses/Orbits: Globes and orbits are unremarkable. Sinuses are clear. Other: None. IMPRESSION: 1. No acute intracranial abnormalities. 2. Age-appropriate volume loss and mild chronic microvascular ischemic change. Electronically Signed   By: Lajean Manes M.D.   On: 10/22/2020 15:50     Assessment/Plan Principal Problem:   Acute exacerbation of CHF (congestive heart failure) (HCC) Active Problems:   Paroxysmal atrial fibrillation (HCC)   Non-ischemic cardiomyopathy (HCC)   Acute on chronic systolic congestive heart failure: Presented with bilateral lower extremity swelling, weight gain.  Takes Lasix and palliative milrinone at home.  Follows with home health therapy.  Poor candidate for LVAD Continue monitoring input/output, daily weight We have requested heart failure team to follow her from tomorrow.  Dr. Aundra Dubin aware We will request pharmacy for restarting home milrinone dose.  In the meantime we have given Lasix 80 mg IV once as  per cardiology recommendation.she takes oral Lasix at home Last echocardiogram on 07/2020 had shown ejection fraction of 20 to 25%.  AKI on CKD stage IIIb: Her last creatinine was around 1.21.  Baseline creatinine around 1.2.  Presented with creatinine in the range of 2.  Most likely cardiorenal syndrome.  Continue IV diuresis.  Avoid nephrotoxins.  Monitor renal function  Troponin elevation: Denies any chest pain.  This is from supply demand ischemia from severe congestive heart failure.  Hypomagnesemia: Supplemented with magnesium  Nonischemic cardiomyopathy: She underwent cardiac cath on 04/2016 which showed normal coronaries.  Paroxysmal A. fib: On Xarelto, amiodarone.  Status post AV node ablation and biventricular pacing.  Has history of frequent PVCs/VT.  History of severe MR/severe TR/moderate AI: Follows with cardiology.  Not a candidate for intervention.  Suspicion for UTI: She was prescribed ciprofloxacin recently which she was not taking.  Will check UA.  Will not start on antibiotic for now.  She denies any urinary symptoms  Goals of care: Elderly patient with multiple comorbidities.  On home milrinone therapy for palliative purpose.  CODE STATUS is DNR.  Cardiology has previously recommended transition to hospice if her overall condition worsens.  Will consult palliative care.  Debility/deconditioning/falls: Complains of generalized weakness, weight gain.  She is status post total left hip replacement.  Will request for PT/OT evaluation.  Severity of Illness: The appropriate patient status for this patient is INPATIENT.  DVT prophylaxis: Xarelto Code Status: DNR Family Communication: Called and discussed with son on phone Consults called: Cardiology     Shelly Coss MD [facility]  10/22/2020, 6:03 PM

## 2020-10-22 NOTE — ED Notes (Signed)
Dinner Tray Ordered @ 1710.

## 2020-10-22 NOTE — Consult Note (Addendum)
Consultation Note Date: 10/22/2020   Patient Name: Cheryl Vargas  DOB: [birth year]  MRN: [id number]  Age / Sex: 86 y.o., female  PCP: Lavone Orn, MD Referring Physician: Shelly Coss, MD  Reason for Consultation: Establishing goals of care  HPI/Patient Profile: 85 y.o. female  with past medical history of nonischemic cardiomyopathy, chronic systolic CHF with EF of 62-70%, paroxysmal atrial fibrillation, severe mitral valve regurgitation, HTN, HLD, and CKD stage IIIb. She presented to the emergency department on 10/22/2020 with complaints of generalized weakness and falling at home.She also reports fatigue and possible UTI. She was hospitalized 08/02/20--08/18/20 with acute on chronic heart failure, discharged home with [facility] and PT. She has been on milrinone infusion  at home. ED Course: Lab work showed AKI on CKD, hypomagnesemia, elevated BNP, mildly elevated troponin. Chest x-ray showed bibasilar atelectasis, no pneumonia.   Per heart failure team, patient is not a candidate for LVAD. Also not a candidate for intervention for severe mitral regurgitation.    Clinical Assessment and Goals of Care: I have reviewed medical records including EPIC notes, labs and imaging, and met at bedside with patient  to discuss diagnosis, prognosis, GOC, EOL wishes, disposition, and options.  Patient is well known to PMT, she was followed by our service during her recent hospitalization 08/02/20--08/18/20 as well as during her hospitalization in June 2021.  She has also received outpatient palliative with [facility].   We briefly reviewed her family/social situation. She has 3 children; 2 of them ([name] and Tim) live in [place]. The other son [name]) lives in [place] and is her [place]. Patient lives alone in a 14th floor apartment in [place].  As far as functional status, she states her home situation is "ok" overall.  She is able to ambulate with a rollator in her apartment. She needs some assistance with ADLs. She has a lady that comes to help her with bathing and doing laundry. She shares that preparing meals is her biggest challenge. Her family as well as several neighbors help her with groceries. She is also trying to get set up to receive Meals on Wheels.   Natural disease trajectory of end-stage heart failure was discussed. She understands well that her disease is end-stage and that her time is limited. She shares that she has been working to get her estate planning in order. She is grateful for the additional time she has had with her family.   We briefly discussed her hospitalization 08/02/20-08/18/20. She share her experience with being on the ventilator. She states she would not want to go through that again.   I attempted to elicit values and goals of care important to the patient. She wants to remain in her home as long as possible. Her goal is to return home and continue milrinone.   Advanced directives, concepts specific to code status, artifical feeding and hydration, and rehospitalization were considered and discussed. We reviewed the MOST form on file. She wishes to make an amendment regarding IV fluid. I stated I would make this change  and bring her an updated form to sign tomorrow.     Primary decision maker: Patient. Surrogate decision maker would be son and HCPOA [name] [phone]    SUMMARY OF RECOMMENDATIONS    DNR/DNI as previously documented  Continue current medical care  Patient states she would not want to be intubated again  Goal of care is to return home and continue milrinone  Heart failure team has recommended transition to hospice if her condition declines - I fully agree  Will update MOST form and have patient sign tomorrow  PMT will continue to follow  Code Status/Advance Care Planning:  DNR  Symptom Management:   Per primary team  Palliative  Prophylaxis:   Frequent Pain Assessment and Turn Reposition  Additional Recommendations (Limitations, Scope, Preferences):  Full Scope Treatment  Psycho-social/Spiritual:  Created space and opportunity for patient to express thoughts and feelings regarding patient's current medical situation.  Emotional support provided  Prognosis:   difficult to determine, likely months  Discharge Planning: likely home with [facility]      Primary Diagnoses: Present on Admission: . Paroxysmal atrial fibrillation ([place])   I have reviewed the medical record, interviewed the patient and family, and examined the patient. The following aspects are pertinent.  Past Medical History:  Diagnosis Date  . Arthritis   . Chronic combined systolic (congestive) and diastolic (congestive) heart failure ([place])   . Mitral regurgitation   . Non-ischemic cardiomyopathy ([place])   . Persistent atrial fibrillation ([place])   . Ventricular tachycardia ([facility])     Family History  Problem Relation Age of Onset  . Lung cancer Mother   . Heart attack Father    Scheduled Meds: . amiodarone  200 mg Oral Daily  . Magnesium Oxide  200 mg Oral BID  . [START ON 10/23/2020] midodrine  10 mg Oral BID WC  . pantoprazole  40 mg Oral Daily  . potassium chloride SA  20 mEq Oral BID  . Rivaroxaban  15 mg Oral Q supper   Continuous Infusions: . sodium chloride    . magnesium sulfate bolus IVPB    . milrinone     PRN Meds:.acetaminophen, LORazepam, ondansetron, senna-docusate  Medications Prior to Admission:  Prior to Admission medications   Medication Sig Start Date End Date Taking? Authorizing Provider  acetaminophen (TYLENOL) 500 MG tablet Take 500-1,000 mg by mouth every 6 (six) hours as needed for headache (pain).   Yes [provider]  amiodarone (PACERONE) 200 MG tablet Take 1 tablet (200 mg total) by mouth daily. 08/18/20  Yes Clegg, [name] D, NP  Calcium Carbonate Antacid (TUMS ULTRA 1000 PO) Take 1,000 mg by mouth 2  (two) times daily as needed (heartburn/indigestion).   Yes [provider]  fluticasone (FLONASE) 50 MCG/ACT nasal spray Place 1 spray into both nostrils daily as needed for allergies or rhinitis (seasonal allergies).   Yes [provider]  furosemide (LASIX) 40 MG tablet Take 1 tablet (40 mg total) every morning and 0.5 tablet (20 mg total) every evening. Patient taking differently: Take 20-40 mg by mouth See admin instructions. Take 1 tablet (40 mg total) every morning and 0.5 tablet (20 mg total) every evening. 10/22/20  Yes Larey Dresser, MD  LORazepam (ATIVAN) 0.5 MG tablet Take 1 tablet (0.5 mg total) by mouth daily as needed (nausea). Patient taking differently: Take 0.5 mg by mouth daily as needed for anxiety (nausea). 02/06/20  Yes Bonnielee Haff, MD  midodrine (PROAMATINE) 10 MG tablet Take 10 mg  by mouth 2 (two) times daily with a meal.   Yes [provider]  milrinone (PRIMACOR) 20 MG/100 ML SOLN infusion Inject 0.0149 mg/min into the vein continuous. Per [place] 08/18/20  Yes Clegg, [name] D, NP  ondansetron (ZOFRAN) 4 MG tablet Take 1 tablet (4 mg total) by mouth 2 (two) times daily as needed for nausea or vomiting. 08/18/20 08/18/21 Yes Larey Dresser, MD  pantoprazole (PROTONIX) 40 MG tablet Take 1 tablet (40 mg total) by mouth daily. 08/18/20  Yes Clegg, [name] D, NP  potassium chloride SA (KLOR-CON M20) 20 MEQ tablet Take 1 tablet (20 mEq total) by mouth 2 (two) times daily. 10/22/20 01/20/21 Yes Larey Dresser, MD  Rivaroxaban (XARELTO) 15 MG TABS tablet Take 1 tablet (15 mg total) by mouth daily with supper. 10/18/20  Yes [place], Maricela Bo, FNP  sennosides-docusate sodium (SENOKOT-S) 8.6-50 MG tablet Take 1 tablet by mouth daily as needed for constipation.   Yes [provider]  zolpidem (AMBIEN) 10 MG tablet Take 10 mg by mouth at bedtime. 10/14/20  Yes [provider]  ciprofloxacin (CIPRO) 250 MG tablet Take 1 tablet (250 mg total) by  mouth 2 (two) times daily. 10/21/20   Larey Dresser, MD  magnesium oxide (MAG-OX) 400 MG tablet Take 200 mg by mouth 2 (two) times daily. 10/20/20   [provider]  Magnesium Oxide 200 MG TABS Take 1 tablet (200 mg total) by mouth 2 (two) times daily. Patient not taking: No sig reported 10/18/20   Rafael Bihari, FNP  zolpidem (AMBIEN) 5 MG tablet Take 1 tablet (5 mg total) by mouth at bedtime. Patient not taking: No sig reported 02/06/20   Bonnielee Haff, MD   Allergies  Allergen Reactions  . Sotalol Other (See Comments)    Prolonged QTc  . Alendronate Sodium Other (See Comments)    Arm pain  . Compazine [Prochlorperazine] Other (See Comments)    Nervous reaction  . Dofetilide Nausea Only    Reported by [facility] physicians  . Lisinopril Other (See Comments)    Low bp  . Zolpidem Tartrate Er Other (See Comments)    Cannot take extended release - makes her too tired the next day  . Ciprofloxacin Other (See Comments)    Not effective  . Coreg [Carvedilol] Other (See Comments)    Fatigue   . Metoprolol Other (See Comments)    Profound fatigue   Review of Systems  Constitutional: Positive for fatigue.  Neurological: Positive for weakness.    Physical Exam Vitals reviewed.  Constitutional:      General: She is not in acute distress.    Appearance: She is ill-appearing.     Comments: frail  Cardiovascular:     Comments: Paced rhythm Central line right chest Pulmonary:     Effort: Pulmonary effort is normal.  Neurological:     Mental Status: She is alert and oriented to person, place, and time.     Motor: Weakness present.     Vital Signs: BP 124/70 (BP Location: Right Arm)   Pulse 82   Temp 98 F (36.7 C) (Oral)   Resp 16   Ht '5\' 5"'  (1.651 m)   Wt 52.6 kg   SpO2 96%   BMI 19.30 kg/m  Pain Scale: 0-10   Pain Score: 0-No pain   SpO2: SpO2: 96 % O2 Device:SpO2: 96 % O2 Flow Rate: .   IO: Intake/output summary: No intake or output data in the 24  hours ending 10/22/20 1920  LBM:   Baseline Weight: Weight: 52.6 kg Most recent weight: Weight: 52.6 kg      Palliative Assessment/Data: PPS 40%     Time In: 19:00 Time Out: 19:53 Time Total: 53 minutes Greater than 50%  of this time was spent counseling and coordinating care related to the above assessment and plan.  Signed by: Lavena Bullion, NP   Please contact Palliative Medicine Team phone at [phone] for questions and concerns.  For individual provider: See Shea Evans

## 2020-10-22 NOTE — ED Notes (Signed)
Changed patient bed placed a brief on patient is resting with call bell in reach

## 2020-10-22 NOTE — ED Provider Notes (Signed)
[facility] EMERGENCY DEPARTMENT Provider Note   CSN: [id number] Arrival date & time: 10/22/20  1218     History Chief Complaint  Patient presents with  . Weakness    Cheryl Vargas is a 86 y.o. female with PMH/o CHF (EF 20-25%), Afib (on xarelto) who presents for generalized weakness that has been ongoing for about a week.  She states she has felt fatigued, has not had no energy.  She states she has fallen a few times.  She thinks she may have hit her head once or twice.  She is on Xarelto for A. fib.  States she is also noted a 2 pound weight gain in the last week and thinks this is coming from leg swelling.  She states that her home health nurse told her that she had a UTI.  She was prescribed antibiotic but has not been able to get.  She states she is not having any dysuria or increased urinary frequency.  She states that she feels like she has no energy.  She has not noted any fevers.  She has had nausea and decreased appetite but no vomiting.  She denies any chest pain, difficulty breathing, vomiting. She has been vaccinated for COVID x 3.   The history is provided by the patient.       Past Medical History:  Diagnosis Date  . Arthritis   . Chronic combined systolic (congestive) and diastolic (congestive) heart failure ([place])   . Mitral regurgitation   . Non-ischemic cardiomyopathy ([place])   . Persistent atrial fibrillation ([place])   . Ventricular tachycardia [facility])     Patient Active Problem List   Diagnosis Date Noted  . Acute exacerbation of CHF (congestive heart failure) ([place]) 10/22/2020  . Acute on chronic combined systolic (congestive) and diastolic (congestive) heart failure ([place])   . Palliative care encounter   . Goals of care, counseling/discussion   . Encounter for hospice care discussion   . Acute on chronic systolic (congestive) heart failure ([place]) 08/02/2020  . Malnutrition of moderate degree 02/03/2020  . DNR (do not resuscitate) discussion    . Palliative care by specialist   . UTI (urinary tract infection) 02/01/2020  . Prolonged QT interval 02/01/2020  . Chronic anticoagulation 02/01/2020  . Transient hypotension 02/01/2020  . History of shingles 02/01/2020  . Near syncope 05/05/2019  . Hypercholesterolemia without hypertriglyceridemia 02/13/2018  . Syncope 01/21/2018  . Constipation 01/21/2018  . CHF exacerbation ([place]) 12/18/2017  . CKD (chronic kidney disease), stage III ([place]) 12/16/2017  . SOB (shortness of breath)   . Abdominal pain 09/18/2017  . CHF (congestive heart failure) ([place]) 09/17/2017  . Pulmonary edema   . Cardiac device in situ   . Ventricular tachycardia ([place])   . Cardiac arrest ([place])   . Severe mitral valve regurgitation   . A-fib ([place]) 09/03/2017  . Mitral regurgitation   . Acute on chronic systolic heart failure ([place])   . Non-ischemic cardiomyopathy ([place])   . CHF (congestive heart failure), NYHA class IV ([place]) 04/24/2016  . Dizziness 12/01/2015  . Hypertension 12/01/2015  . Insomnia 12/01/2015  . Chronic combined systolic and diastolic CHF (congestive heart failure) ([place]) 12/01/2015  . Paroxysmal atrial fibrillation (H[facility])   . Cardiac arrhythmia 10/20/2014  . Systolic dysfunction 17/40/8144  . Hypoventilation, idiopathic 10/20/2014  . Weakness 10/20/2014  . Closed left hip fracture ([place]) 03/17/2013  . Cardiomegaly 03/17/2013  . Frequent PVCs 03/17/2013  . Hyperlipidemia 03/17/2013    Past Surgical History:  Procedure Laterality Date  . AV NODE ABLATION N/A 09/07/2017   Procedure: AV NODE ABLATION;  Surgeon: [name], MD;  Location: [place] CV LAB;  Service: Cardiovascular;  Laterality: N/A;  . BIV ICD INSERTION CRT-D N/A 09/07/2017   Procedure: BIV ICD INSERTION CRT-D;  Surgeon: [name], MD;  Location: [place] [facility] LAB;  Service: Cardiovascular;  Laterality: N/A;  . BUNIONECTOMY    . CARDIAC CATHETERIZATION     in 2004 at [facility]. "Insignificant blockage" per patient   . CARDIAC CATHETERIZATION N/A 04/25/2016   Procedure: Left Heart Cath and Coronary Angiography;  Surgeon: Nelva Bush, MD;  Location: [place] CV LAB;  Service: Cardiovascular;  Laterality: N/A;  . CARDIOVERSION N/A 06/29/2017   Procedure: CARDIOVERSION;  Surgeon: Larey Dresser, MD;  Location: [facility] ENDOSCOPY;  Service: Cardiovascular;  Laterality: N/A;  . CARDIOVERSION N/A 08/28/2017   Procedure: CARDIOVERSION;  Surgeon: Larey Dresser, MD;  Location: [place];  Service: Cardiovascular;  Laterality: N/A;  . IR ANGIOGRAM PULMONARY RIGHT SELECTIVE  08/04/2020  . IR ANGIOGRAM SELECTIVE EACH ADDITIONAL VESSEL  08/04/2020  . IR CT SPINE LTD  08/04/2020  . IR EMBO ART  VEN HEMORR LYMPH EXTRAV  INC GUIDE ROADMAPPING  08/04/2020  . IR FLUORO GUIDE CV LINE RIGHT  08/11/2020  . IR US GUIDE VASC ACCESS RIGHT  08/11/2020  . RADIOLOGY WITH ANESTHESIA N/A 08/04/2020   Procedure: IR WITH ANESTHESIA;  Surgeon: Arne Cleveland, MD;  Location: [place];  Service: Radiology;  Laterality: N/A;  . RIGHT HEART CATH N/A 09/29/2019   Procedure: RIGHT HEART CATH;  Surgeon: Larey Dresser, MD;  Location: [place] CV LAB;  Service: Cardiovascular;  Laterality: N/A;  . RIGHT HEART CATH N/A 08/04/2020   Procedure: RIGHT HEART CATH;  Surgeon: Larey Dresser, MD;  Location: [place] CV LAB;  Service: Cardiovascular;  Laterality: N/A;  . SHOULDER SURGERY     closed reduction  . TEE WITHOUT CARDIOVERSION N/A 06/29/2017   Procedure: TRANSESOPHAGEAL ECHOCARDIOGRAM (TEE);  Surgeon: Larey Dresser, MD;  Location: [facility] ENDOSCOPY;  Service: Cardiovascular;  Laterality: N/A;  . TONSILLECTOMY    . TOTAL HIP ARTHROPLASTY Left 03/18/2013   Procedure: TOTAL HIP ARTHROPLASTY ANTERIOR APPROACH;  Surgeon: Mauri Pole, MD;  Location: [facility] ORS;  Service: Orthopedics;  Laterality: Left;  . TUBAL LIGATION       OB History   No obstetric history on file.     Family History  Problem Relation Age of Onset  . Lung  cancer Mother   . Heart attack Father     Social History   Tobacco Use  . Smoking status: Never Smoker  . Smokeless tobacco: Never Used  Vaping Use  . Vaping Use: Never used  Substance Use Topics  . Alcohol use: Yes    Alcohol/week: 3.0 standard drinks    Types: 3 Glasses of wine per week    Comment: per week  . Drug use: No    Home Medications Prior to Admission medications   Medication Sig Start Date End Date Taking? Authorizing Provider  acetaminophen (TYLENOL) 500 MG tablet Take 500-1,000 mg by mouth every 6 (six) hours as needed for headache (pain).   Yes [provider]  amiodarone (PACERONE) 200 MG tablet Take 1 tablet (200 mg total) by mouth daily. 08/18/20  Yes Clegg, [name] D, NP  Calcium Carbonate Antacid (TUMS ULTRA 1000 PO) Take 1,000 mg by mouth 2 (two) times daily as needed (heartburn/indigestion).   Yes [provider]  fluticasone (FLONASE) 50 MCG/ACT nasal spray Place 1 spray into both nostrils daily as needed for allergies or rhinitis (seasonal allergies).   Yes [provider]  furosemide (LASIX) 40 MG tablet Take 1 tablet (40 mg total) every morning and 0.5 tablet (20 mg total) every evening. Patient taking differently: Take 20-40 mg by mouth See admin instructions. Take 1 tablet (40 mg total) every morning and 0.5 tablet (20 mg total) every evening. 10/22/20  Yes Larey Dresser, MD  LORazepam (ATIVAN) 0.5 MG tablet Take 1 tablet (0.5 mg total) by mouth daily as needed (nausea). Patient taking differently: Take 0.5 mg by mouth daily as needed for anxiety (nausea). 02/06/20  Yes Bonnielee Haff, MD  midodrine (PROAMATINE) 10 MG tablet Take 10 mg by mouth 2 (two) times daily with a meal.   Yes [provider]  milrinone (PRIMACOR) 20 MG/100 ML SOLN infusion Inject 0.0149 mg/min into the vein continuous. Per [place] 08/18/20  Yes Clegg, [name] D, NP  ondansetron (ZOFRAN) 4 MG tablet Take 1 tablet (4 mg total) by mouth 2 (two) times  daily as needed for nausea or vomiting. 08/18/20 08/18/21 Yes Larey Dresser, MD  pantoprazole (PROTONIX) 40 MG tablet Take 1 tablet (40 mg total) by mouth daily. 08/18/20  Yes Clegg, [name] D, NP  potassium chloride SA (KLOR-CON M20) 20 MEQ tablet Take 1 tablet (20 mEq total) by mouth 2 (two) times daily. 10/22/20 01/20/21 Yes Larey Dresser, MD  Rivaroxaban (XARELTO) 15 MG TABS tablet Take 1 tablet (15 mg total) by mouth daily with supper. 10/18/20  Yes [place], Maricela Bo, FNP  sennosides-docusate sodium (SENOKOT-S) 8.6-50 MG tablet Take 1 tablet by mouth daily as needed for constipation.   Yes [provider]  zolpidem (AMBIEN) 10 MG tablet Take 10 mg by mouth at bedtime. 10/14/20  Yes [provider]  ciprofloxacin (CIPRO) 250 MG tablet Take 1 tablet (250 mg total) by mouth 2 (two) times daily. 10/21/20   Larey Dresser, MD  magnesium oxide (MAG-OX) 400 MG tablet Take 200 mg by mouth 2 (two) times daily. 10/20/20   [provider]  Magnesium Oxide 200 MG TABS Take 1 tablet (200 mg total) by mouth 2 (two) times daily. Patient not taking: No sig reported 10/18/20   Rafael Bihari, FNP  zolpidem (AMBIEN) 5 MG tablet Take 1 tablet (5 mg total) by mouth at bedtime. Patient not taking: No sig reported 02/06/20   Bonnielee Haff, MD    Allergies    Sotalol, Alendronate sodium, Compazine [prochlorperazine], Dofetilide, Lisinopril, Zolpidem tartrate er, Ciprofloxacin, Coreg [carvedilol], and Metoprolol  Review of Systems   Review of Systems  Constitutional: Positive for fatigue. Negative for fever.  Respiratory: Negative for cough and shortness of breath.   Cardiovascular: Positive for leg swelling. Negative for chest pain.  Gastrointestinal: Negative for abdominal pain, nausea and vomiting.  Genitourinary: Negative for dysuria and hematuria.  Neurological: Positive for weakness (generalized). Negative for headaches.  All other systems reviewed and are negative.   Physical  Exam Updated Vital Signs BP 124/70 (BP Location: Right Arm)   Pulse 82   Temp 98 F (36.7 C) (Oral)   Resp 16   Ht 5\' 5"  (1.651 m)   Wt 52.6 kg   SpO2 96%   BMI 19.30 kg/m   Physical Exam Vitals and nursing note reviewed.  Constitutional:      Appearance: Normal appearance. She is well-developed and well-nourished.  HENT:  Head: Normocephalic and atraumatic.     Mouth/Throat:     Mouth: Oropharynx is clear and moist and mucous membranes are normal.  Eyes:     General: Lids are normal.     Extraocular Movements: EOM normal.     Conjunctiva/sclera: Conjunctivae normal.     Pupils: Pupils are equal, round, and reactive to light.  Cardiovascular:     Rate and Rhythm: Normal rate and regular rhythm.     Pulses: Normal pulses.     Heart sounds: Normal heart sounds. No murmur heard. No friction rub. No gallop.   Pulmonary:     Effort: Pulmonary effort is normal.     Breath sounds: Normal breath sounds.     Comments: Lungs clear to auscultation bilaterally.  Symmetric chest rise.  No wheezing, rales, rhonchi. Chest:    Abdominal:     Palpations: Abdomen is soft. Abdomen is not rigid.     Tenderness: There is no abdominal tenderness. There is no guarding.     Comments: Abdomen is soft, non-distended, non-tender. No rigidity, No guarding. No peritoneal signs.  Musculoskeletal:        General: Normal range of motion.     Cervical back: Full passive range of motion without pain.     Comments: 1+ pitting edema noted to distal BLE. No overlying warmth, erythema.   Skin:    General: Skin is warm and dry.     Capillary Refill: Capillary refill takes less than 2 seconds.  Neurological:     Mental Status: She is alert and oriented to person, place, and time.  Psychiatric:        Mood and Affect: Mood and affect normal.        Speech: Speech normal.     ED Results / Procedures / Treatments   Labs (all labs ordered are listed, but only abnormal results are displayed) Labs  Reviewed  COMPREHENSIVE METABOLIC PANEL - Abnormal; Notable for the following components:      Result Value   CO2 20 (*)    Glucose, Bld 116 (*)    BUN 50 (*)    Creatinine, Ser 2.29 (*)    AST 47 (*)    Total Bilirubin 1.5 (*)    GFR, Estimated 21 (*)    All other components within normal limits  CBC WITH DIFFERENTIAL/PLATELET - Abnormal; Notable for the following components:   Hemoglobin 10.8 (*)    HCT 33.9 (*)    RDW 16.1 (*)    All other components within normal limits  PROTIME-INR - Abnormal; Notable for the following components:   Prothrombin Time 29.6 (*)    INR 2.9 (*)    All other components within normal limits  MAGNESIUM - Abnormal; Notable for the following components:   Magnesium 1.5 (*)    All other components within normal limits  BRAIN NATRIURETIC PEPTIDE - Abnormal; Notable for the following components:   B Natriuretic Peptide 1,503.8 (*)    All other components within normal limits  TROPONIN I (HIGH SENSITIVITY) - Abnormal; Notable for the following components:   Troponin I (High Sensitivity) 48 (*)    All other components within normal limits  TROPONIN I (HIGH SENSITIVITY) - Abnormal; Notable for the following components:   Troponin I (High Sensitivity) 41 (*)    All other components within normal limits  RESP PANEL BY RT-PCR (FLU A&B, COVID) ARPGX2  CULTURE, BLOOD (ROUTINE X 2)  CULTURE, BLOOD (ROUTINE X 2)  LACTIC ACID, PLASMA  BASIC METABOLIC PANEL  CBC  URINALYSIS, ROUTINE W REFLEX MICROSCOPIC  MAGNESIUM    EKG EKG Interpretation  Date/Time:  Friday October 22 2020 12:25:46 EST Ventricular Rate:  82 PR Interval:    QRS Duration: 118 QT Interval:  466 QTC Calculation: 544 R Axis:   178 Text Interpretation: Paced rhythm similar to Aug 03 2020 Low voltage QRS Possible Anterolateral infarct , age undetermined Prolonged QT Abnormal ECG Confirmed by Octaviano Glow [phone]) on 10/22/2020 1:42:48 PM   Radiology CT ABDOMEN PELVIS WO  CONTRAST  Result Date: 10/22/2020 CLINICAL DATA:  Multiple falls x1 week with weakness on anticoagulants. EXAM: CT ABDOMEN AND PELVIS WITHOUT CONTRAST TECHNIQUE: Multidetector CT imaging of the abdomen and pelvis was performed following the standard protocol without IV contrast. COMPARISON:  Feb 03, 2020 FINDINGS: Lower chest: No acute abnormality. Hepatobiliary: Stable cystic appearing areas are seen within the anterior aspect of the right lobe of the liver and medial aspect of the left lobe. No gallstones, gallbladder wall thickening, or biliary dilatation. Pancreas: Unremarkable. No pancreatic ductal dilatation or surrounding inflammatory changes. Spleen: Normal in size without focal abnormality. Adrenals/Urinary Tract: Adrenal glands are unremarkable. Kidneys are normal in size, without renal calculi or hydronephrosis. A stable 8.3 cm x 6.6 cm mildly complex cyst is seen within the right kidney. Bladder is limited in evaluation secondary to overlying streak artifact. Stomach/Bowel: There is a small hiatal hernia. Appendix appears normal. No evidence of bowel wall thickening, distention, or inflammatory changes. Noninflamed diverticula are seen throughout the sigmoid colon. Vascular/Lymphatic: Marked severity aortic calcification and atherosclerosis. No enlarged abdominal or pelvic lymph nodes. Reproductive: The expected region of the uterus and bilateral adnexa is limited in evaluation secondary to overlying streak artifact. Other: No abdominal wall hernia or abnormality. No abdominopelvic ascites. Musculoskeletal: A total left hip replacement is seen. An extensive amount of associated streak artifact is noted with subsequently limited evaluation of the adjacent osseous and soft tissue structures. Multilevel degenerative changes seen throughout the lumbar spine. IMPRESSION: 1. Limited evaluation of the pelvis secondary to overlying streak artifact. 2. Stable cystic appearing areas within the liver and right  kidney. 3. Sigmoid diverticulosis. 4. Small hiatal hernia. 5. Total left hip replacement. 6. Aortic atherosclerosis. Aortic Atherosclerosis (ICD10-I70.0). Electronically Signed   By: Virgina Norfolk M.D.   On: 10/22/2020 15:57   DG Chest 2 View  Result Date: 10/22/2020 CLINICAL DATA:  Suspected sepsis. EXAM: CHEST - 2 VIEW COMPARISON:  08/02/2020 FINDINGS: Right jugular central venous catheter tip in the lower SVC. AICD in satisfactory position and unchanged. Interval placement of coils in the pulmonary vascularity posterior to the left atrium for pseudoaneurysm of the pulmonary artery. Mild bibasilar atelectasis similar to the prior study. Negative for pleural effusion. IMPRESSION: Mild bibasilar atelectasis unchanged from the prior study. Electronically Signed   By: Franchot Gallo M.D.   On: 10/22/2020 12:56   CT Head Wo Contrast  Result Date: 10/22/2020 CLINICAL DATA:  Multiple falls last week.  On anticoagulation. EXAM: CT HEAD WITHOUT CONTRAST TECHNIQUE: Contiguous axial images were obtained from the base of the skull through the vertex without intravenous contrast. COMPARISON:  Brain MRI, 12/01/2015. FINDINGS: Brain: No evidence of acute infarction, hemorrhage, hydrocephalus, extra-axial collection or mass lesion/mass effect. There is mild ventricular enlargement consistent with age appropriate volume loss. Mild periventricular white matter hypoattenuation is also noted consistent with chronic microvascular ischemic change. Vascular: No hyperdense vessel or unexpected calcification. Skull: Normal. Negative for fracture or focal lesion. Sinuses/Orbits: Globes and orbits  are unremarkable. Sinuses are clear. Other: None. IMPRESSION: 1. No acute intracranial abnormalities. 2. Age-appropriate volume loss and mild chronic microvascular ischemic change. Electronically Signed   By: Lajean Manes M.D.   On: 10/22/2020 15:50    Procedures Procedures   Medications Ordered in ED Medications   acetaminophen (TYLENOL) tablet 500 mg (has no administration in time range)  amiodarone (PACERONE) tablet 200 mg (has no administration in time range)  midodrine (PROAMATINE) tablet 10 mg (has no administration in time range)  LORazepam (ATIVAN) tablet 0.5 mg (has no administration in time range)  ondansetron (ZOFRAN) tablet 4 mg (has no administration in time range)  pantoprazole (PROTONIX) EC tablet 40 mg (has no administration in time range)  senna-docusate (Senokot-S) tablet 1 tablet (has no administration in time range)  Rivaroxaban (XARELTO) tablet 15 mg (has no administration in time range)  Magnesium Oxide TABS 200 mg (has no administration in time range)  potassium chloride SA (KLOR-CON) CR tablet 20 mEq (has no administration in time range)  magnesium sulfate IVPB 2 g 50 mL (has no administration in time range)  milrinone (PRIMACOR) 20 MG/100 ML (0.2 mg/mL) infusion (has no administration in time range)  0.9 %  sodium chloride infusion (has no administration in time range)    ED Course  I have reviewed the triage vital signs and the nursing notes.  Pertinent labs & imaging results that were available during my care of the patient were reviewed by me and considered in my medical decision making (see chart for details).  Clinical Course as of 10/22/20 1827  Fri Oct 22, 2020  1537 Troponin I (High Sensitivity)(!) [MT]  [age] yo female w/ sig CHF EF < 20%, with ICD on milrinone infusion, presenting to Ed with weakness, legs giving out on her at home.  Some mild dypsnea.  On exam she has fine crackles in base of lung, some symmetrical pitting edema.  BNP up today to 1503.  Labs note AKI, Cr doubled today - possible cardiorenal syndrome with elevated BUN [MT]    Clinical Course User Index [MT] Trifan, Carola Rhine, MD   MDM Rules/Calculators/A&P                          86 year old female who presents for evaluation of generalized weakness.  She also reports some increased  bilateral lower extremity swelling.  She states she has gained about 2 pounds in the last week.  She states her weakness is gotten worse.  Also reports that her home health nurse told her she had a UTI.  She has not been taking antibiotics.  On initial arrival, she is afebrile, toxic appearing.  Vitals are stable.  On exam, she feels diffusely weak but no focal weakness.  Abdomen exam is benign.  We will plan to check labs, chest x-ray.  Additionally, will plan for CT head given that she has had falls and is on blood thinners.  Review of her records show that she was prescribed Cipro but has not been taking.   CMP shows BUN of 50, creatinine of 2.29.  This is above her baseline.  Her last creatinine 2 months ago was 1.21.  Review of the records show that she is consistently with a creatinine of 1.  BNP is elevated at 1503.8.  Lactic is normal.  Initial Trope is 48.  I suspect this is likely from demand ischemia. CXR shows mild bibasilar atelectasis.  BNP is 1503.8.  She  has elevated BNP is chronically.  She had been up into the 16-1800s and then the most recent was down to about 1000.  This does seem to be a slight increase.  Delta trop is 41.  I feel that this likely represents demand ischemia.  CT head negative for any acute intracranial normalities.  CT of the pelvis shows no evidence of any acute abnormalities.  Discussed patient with Dr. Tawanna Solo (Hospitalist). Will accept patient for admission.   Discussed patient with Dr. Marlou Porch (cards). He recommends keeping patient's milrinone dose as same and giving a 1x dose of lasix. He will let the advanced heart failure team know of patient.   Updated son on plan.   Portions of this note were generated with [employer]. Dictation errors may occur despite best attempts at proofreading.   Final Clinical Impression(s) / ED Diagnoses Final diagnoses:  Generalized weakness  AKI (acute kidney injury) [facility])    Rx / DC Orders ED  Discharge Orders    None       Desma Mcgregor 10/22/20 1827    Wyvonnia Dusky, MD 10/22/20 [phone]

## 2020-10-22 NOTE — ED Notes (Signed)
Placed a external cath patient is resting with call bell in reach 

## 2020-10-23 ENCOUNTER — Inpatient Hospital Stay (HOSPITAL_COMMUNITY): Payer: PPO

## 2020-10-23 DIAGNOSIS — I5023 Acute on chronic systolic (congestive) heart failure: Secondary | ICD-10-CM | POA: Diagnosis not present

## 2020-10-23 LAB — URINALYSIS, ROUTINE W REFLEX MICROSCOPIC
Bilirubin Urine: NEGATIVE
Glucose, UA: NEGATIVE mg/dL
Ketones, ur: NEGATIVE mg/dL
Nitrite: NEGATIVE
Protein, ur: 100 mg/dL — AB
Specific Gravity, Urine: >1.03 — ABNORMAL HIGH (ref 1.005–1.030)
pH: 5.5 (ref 5.0–8.0)

## 2020-10-23 LAB — CBC
HCT: 32.4 % — ABNORMAL LOW (ref 36.0–46.0)
Hemoglobin: 10.6 g/dL — ABNORMAL LOW (ref 12.0–15.0)
MCH: 27.2 pg (ref 26.0–34.0)
MCHC: 32.7 g/dL (ref 30.0–36.0)
MCV: 83.1 fL (ref 80.0–100.0)
Platelets: 216 10*3/uL (ref 150–400)
RBC: 3.9 MIL/uL (ref 3.87–5.11)
RDW: 16.2 % — ABNORMAL HIGH (ref 11.5–15.5)
WBC: 5.8 10*3/uL (ref 4.0–10.5)
nRBC: 0 % (ref 0.0–0.2)

## 2020-10-23 LAB — MAGNESIUM: Magnesium: 2.4 mg/dL (ref 1.7–2.4)

## 2020-10-23 LAB — URINALYSIS, MICROSCOPIC (REFLEX)

## 2020-10-23 LAB — BASIC METABOLIC PANEL
Anion gap: 11 (ref 5–15)
BUN: 48 mg/dL — ABNORMAL HIGH (ref 8–23)
CO2: 22 mmol/L (ref 22–32)
Calcium: 8.8 mg/dL — ABNORMAL LOW (ref 8.9–10.3)
Chloride: 101 mmol/L (ref 98–111)
Creatinine, Ser: 1.95 mg/dL — ABNORMAL HIGH (ref 0.44–1.00)
GFR, Estimated: 25 mL/min — ABNORMAL LOW (ref >60)
Glucose, Bld: 111 mg/dL — ABNORMAL HIGH (ref 70–99)
Potassium: 3.8 mmol/L (ref 3.5–5.1)
Sodium: 134 mmol/L — ABNORMAL LOW (ref 135–145)

## 2020-10-23 MED ORDER — LORAZEPAM 0.5 MG PO TABS
0.5000 mg | ORAL_TABLET | Freq: Three times a day (TID) | ORAL | Status: DC | PRN
  Administered 2020-10-23 – 2020-10-30 (×11): 0.5 mg via ORAL
  Filled 2020-10-23 (×14): qty 1

## 2020-10-23 MED ORDER — ROPINIROLE HCL 0.5 MG PO TABS
0.5000 mg | ORAL_TABLET | Freq: Every day | ORAL | Status: DC
  Administered 2020-10-23 – 2020-10-28 (×5): 0.5 mg via ORAL
  Filled 2020-10-23 (×6): qty 1

## 2020-10-23 MED ORDER — ACETAMINOPHEN 325 MG PO TABS
650.0000 mg | ORAL_TABLET | Freq: Four times a day (QID) | ORAL | Status: DC | PRN
  Administered 2020-10-23: 650 mg via ORAL
  Filled 2020-10-23: qty 2

## 2020-10-23 MED ORDER — ZOLPIDEM TARTRATE 5 MG PO TABS
5.0000 mg | ORAL_TABLET | Freq: Every evening | ORAL | Status: DC | PRN
  Administered 2020-10-23 – 2020-10-29 (×7): 5 mg via ORAL
  Filled 2020-10-23 (×7): qty 1

## 2020-10-23 MED ORDER — SODIUM CHLORIDE 0.9 % IV SOLN: INTRAVENOUS | Status: DC

## 2020-10-23 MED ORDER — ROPINIROLE HCL 0.5 MG PO TABS
0.2500 mg | ORAL_TABLET | Freq: Every day | ORAL | Status: DC
  Filled 2020-10-23: qty 1

## 2020-10-23 MED ORDER — CHLORHEXIDINE GLUCONATE CLOTH 2 % EX PADS
6.0000 | MEDICATED_PAD | Freq: Every day | CUTANEOUS | Status: DC
  Administered 2020-10-24 – 2020-10-29 (×6): 6 via TOPICAL

## 2020-10-23 NOTE — Progress Notes (Signed)
PROGRESS NOTE    Cheryl Vargas  UXL:[id number] DOB: [birth year] DOA: 10/22/2020 PCP: Lavone Orn, MD   Chief Complain: Weakness  Brief Narrative: Cheryl Vargas is a 86 y.o. female with medical history of nonischemic cardiomyopathy, chronic systolic congestive heart failure with ejection fraction of 20 to 25%, proximal A. fib, hyperlipidemia, hypertension, severe mitral valve regurgitation, CKD stage IIIb, Status post total left knee replacement who presents to the emergency department with complaints of generalized weakness, bilateral lower extremity swelling.  Patient states that she has been more fatigued than usual and also fell few times at home.  She follows with heart failure clinic.  She is on milrinone drip at home.  She follows with home health.  Patient also reported that she had a weight gain of 2 pounds in the last week. On presentation, she was also found to have AKI on CKD. Patient was not eating or drinking well since last few days at home. Start on gentle IV fluids. CHF team   following.  Assessment & Plan:   Principal Problem:   Acute exacerbation of CHF (congestive heart failure) (HCC) Active Problems:   Paroxysmal atrial fibrillation (HCC)   Non-ischemic cardiomyopathy (HCC)    Acute on chronic systolic congestive heart failure: Presented with bilateral lower extremity swelling, weight gain.  Takes Lasix and palliative milrinone at home.  Follows with home health therapy.  Poor candidate for LVAD Continue monitoring input/output, daily weight We will request pharmacy for restarting home milrinone dose. Last echocardiogram on 07/2020 had shown ejection fraction of 20 to 25%. Cardiology following  AKI on CKD stage IIIb: Her last creatinine was around 1.21.  Baseline creatinine around 1.2.  Presented with creatinine in the range of 2. Apparently we knew that she was taking more Lasix than usual and was having poor oral intake at home. Start on gentle IV fluids. Kidney  function improving..  Avoid nephrotoxins.  Monitor renal function  Troponin elevation: Denies any chest pain.  This is from supply demand ischemia from severe congestive heart failure.  Hypomagnesemia: Supplemented with magnesium  Nonischemic cardiomyopathy: She underwent cardiac cath on 04/2016 which showed normal coronaries.  Paroxysmal A. fib: On Xarelto, amiodarone.  Status post AV node ablation and biventricular pacing.  Has history of frequent PVCs/VT.  History of severe MR/severe TR/moderate AI: Follows with cardiology.  Not a candidate for intervention.  Suspicion for UTI: She was prescribed ciprofloxacin recently which she was not taking.  Will check UA.  Will not start on antibiotic for now.  She denies any urinary symptoms  Goals of care: Elderly patient with multiple comorbidities.  On home milrinone therapy for palliative purpose.  CODE STATUS is DNR.  Cardiology has previously recommended transition to hospice if her overall condition worsens.  We have consulted palliative care.  Debility/deconditioning/falls: Complains of generalized weakness, weight gain.  She is status post total left hip replacement.  Will request for PT/OT evaluation. She also has some bruise on her left hip after recent fall. Will check x-ray of the pelvis.          DVT prophylaxis:Xarelto Code Status: Full Family Communication: Discussed with son on phone on 10/22/2020 Status is: Inpatient  Remains inpatient appropriate because:Inpatient level of care appropriate due to severity of illness   Dispo: The patient is from: Home              Anticipated d/c is to: Home  Anticipated d/c date is: 2 days              Patient currently is not medically stable to d/c.   Difficult to place patient No     Consultants: Cardiology  Procedures:None  Antimicrobials:  Anti-infectives (From admission, onward)   None      Subjective: Patient seen and examined at the bedside  this morning. She reported that she could not sleep last night. Also complains of some pain on the left hip. She was not happy because she was not given Ambien last night. She says she wants to go home.  Objective: Vitals:   10/23/20 0415 10/23/20 0615 10/23/20 0949 10/23/20 1052  BP: 121/63 102/65 116/67 100/77  Pulse: 83 85 82 96  Resp: 17 19 12 18   Temp:   97.6 F (36.4 C) 98 F (36.7 C)  TempSrc:   Oral Oral  SpO2: 94% 100% 95%   Weight:    56.4 kg  Height:    5\' 5"  (1.651 m)   No intake or output data in the 24 hours ending 10/23/20 1156 Filed Weights   10/22/20 1306 10/23/20 1052  Weight: 52.6 kg 56.4 kg    Examination:  General exam: Very deconditioned, debilitated female, chronically looking, generalized weakness  Respiratory system: Bilateral equal air entry, normal vesicular breath sounds, no wheezes or crackles  Cardiovascular system: S1 & S2 heard, RRR. No JVD, murmurs, rubs, gallops or clicks. trace pedal edema. Pacemaker Gastrointestinal system: Abdomen is nondistended, soft and nontender. No organomegaly or masses felt. Normal bowel sounds heard. Central nervous system: Alert and oriented. No focal neurological deficits. Extremities: 1+ bilateral lower extremity edema, no clubbing ,no cyanosis Skin: No rashes, lesions or ulcers,no icterus ,no pallor   Data Reviewed: I have personally reviewed following labs and imaging studies  CBC: Recent Labs  Lab 10/22/20 1227 10/23/20 0317  WBC 6.0 5.8  NEUTROABS 4.4  --   HGB 10.8* 10.6*  HCT 33.9* 32.4*  MCV 83.1 83.1  PLT 224 161   Basic Metabolic Panel: Recent Labs  Lab 10/22/20 1227 10/22/20 1513 10/23/20 0317  NA 135  --  134*  K 4.1  --  3.8  CL 100  --  101  CO2 20*  --  22  GLUCOSE 116*  --  111*  BUN 50*  --  48*  CREATININE 2.29*  --  1.95*  CALCIUM 9.1  --  8.8*  MG  --  1.5* 2.4   GFR: Estimated Creatinine Clearance: 19.5 mL/min (A) (by C-G formula based on SCr of 1.95 mg/dL (H)). Liver  Function Tests: Recent Labs  Lab 10/22/20 1227  AST 47*  ALT 39  ALKPHOS 66  BILITOT 1.5*  PROT 7.5  ALBUMIN 3.5   No results for input(s): LIPASE, AMYLASE in the last 168 hours. No results for input(s): AMMONIA in the last 168 hours. Coagulation Profile: Recent Labs  Lab 10/22/20 1227  INR 2.9*   Cardiac Enzymes: No results for input(s): CKTOTAL, CKMB, CKMBINDEX, TROPONINI in the last 168 hours. BNP (last 3 results) No results for input(s): PROBNP in the last 8760 hours. HbA1C: No results for input(s): HGBA1C in the last 72 hours. CBG: No results for input(s): GLUCAP in the last 168 hours. Lipid Profile: No results for input(s): CHOL, HDL, LDLCALC, TRIG, CHOLHDL, LDLDIRECT in the last 72 hours. Thyroid Function Tests: No results for input(s): TSH, T4TOTAL, FREET4, T3FREE, THYROIDAB in the last 72 hours. Anemia Panel: No results for input(s):  VITAMINB12, FOLATE, FERRITIN, TIBC, IRON, RETICCTPCT in the last 72 hours. Sepsis Labs: Recent Labs  Lab 10/22/20 1331  LATICACIDVEN 1.6    Recent Results (from the past 240 hour(s))  Resp Panel by RT-PCR (Flu A&B, Covid) Nasopharyngeal Swab     Status: None   Collection Time: 10/22/20  2:26 PM   Specimen: Nasopharyngeal Swab; Nasopharyngeal(NP) swabs in vial transport medium  Result Value Ref Range Status   SARS Coronavirus 2 by RT PCR NEGATIVE NEGATIVE Final    Comment: (NOTE) SARS-CoV-2 target nucleic acids are NOT DETECTED.  The SARS-CoV-2 RNA is generally detectable in upper respiratory specimens during the acute phase of infection. The lowest concentration of SARS-CoV-2 viral copies this assay can detect is 138 copies/mL. A negative result does not preclude SARS-Cov-2 infection and should not be used as the sole basis for treatment or other patient management decisions. A negative result may occur with  improper specimen collection/handling, submission of specimen other than nasopharyngeal swab, presence of viral  mutation(s) within the areas targeted by this assay, and inadequate number of viral copies(<138 copies/mL). A negative result must be combined with clinical observations, patient history, and epidemiological information. The expected result is Negative.  Fact Sheet for Patients:  EntrepreneurPulse.com.au  Fact Sheet for Healthcare Providers:  IncredibleEmployment.be  This test is no t yet approved or cleared by the [place] FDA and  has been authorized for detection and/or diagnosis of SARS-CoV-2 by FDA under an Emergency Use Authorization (EUA). This EUA will remain  in effect (meaning this test can be used) for the duration of the COVID-19 declaration under Section 564(b)(1) of the Act, 21 U.S.C.section 360bbb-3(b)(1), unless the authorization is terminated  or revoked sooner.       Influenza A by PCR NEGATIVE NEGATIVE Final   Influenza B by PCR NEGATIVE NEGATIVE Final    Comment: (NOTE) The Xpert Xpress SARS-CoV-2/FLU/RSV plus assay is intended as an aid in the diagnosis of influenza from Nasopharyngeal swab specimens and should not be used as a sole basis for treatment. Nasal washings and aspirates are unacceptable for Xpert Xpress SARS-CoV-2/FLU/RSV testing.  Fact Sheet for Patients: EntrepreneurPulse.com.au  Fact Sheet for Healthcare Providers: IncredibleEmployment.be  This test is not yet approved or cleared by the [place] FDA and has been authorized for detection and/or diagnosis of SARS-CoV-2 by FDA under an Emergency Use Authorization (EUA). This EUA will remain in effect (meaning this test can be used) for the duration of the COVID-19 declaration under Section 564(b)(1) of the Act, 21 U.S.C. section 360bbb-3(b)(1), unless the authorization is terminated or revoked.  Performed at [facility], [place] [address], [place], [place] [address]          Radiology Studies: CT  ABDOMEN PELVIS WO CONTRAST  Result Date: 10/22/2020 CLINICAL DATA:  Multiple falls x1 week with weakness on anticoagulants. EXAM: CT ABDOMEN AND PELVIS WITHOUT CONTRAST TECHNIQUE: Multidetector CT imaging of the abdomen and pelvis was performed following the standard protocol without IV contrast. COMPARISON:  Feb 03, 2020 FINDINGS: Lower chest: No acute abnormality. Hepatobiliary: Stable cystic appearing areas are seen within the anterior aspect of the right lobe of the liver and medial aspect of the left lobe. No gallstones, gallbladder wall thickening, or biliary dilatation. Pancreas: Unremarkable. No pancreatic ductal dilatation or surrounding inflammatory changes. Spleen: Normal in size without focal abnormality. Adrenals/Urinary Tract: Adrenal glands are unremarkable. Kidneys are normal in size, without renal calculi or hydronephrosis. A stable 8.3 cm x 6.6 cm mildly complex  cyst is seen within the right kidney. Bladder is limited in evaluation secondary to overlying streak artifact. Stomach/Bowel: There is a small hiatal hernia. Appendix appears normal. No evidence of bowel wall thickening, distention, or inflammatory changes. Noninflamed diverticula are seen throughout the sigmoid colon. Vascular/Lymphatic: Marked severity aortic calcification and atherosclerosis. No enlarged abdominal or pelvic lymph nodes. Reproductive: The expected region of the uterus and bilateral adnexa is limited in evaluation secondary to overlying streak artifact. Other: No abdominal wall hernia or abnormality. No abdominopelvic ascites. Musculoskeletal: A total left hip replacement is seen. An extensive amount of associated streak artifact is noted with subsequently limited evaluation of the adjacent osseous and soft tissue structures. Multilevel degenerative changes seen throughout the lumbar spine. IMPRESSION: 1. Limited evaluation of the pelvis secondary to overlying streak artifact. 2. Stable cystic appearing areas within the  liver and right kidney. 3. Sigmoid diverticulosis. 4. Small hiatal hernia. 5. Total left hip replacement. 6. Aortic atherosclerosis. Aortic Atherosclerosis (ICD10-I70.0). Electronically Signed   By: Virgina Norfolk M.D.   On: 10/22/2020 15:57   DG Chest 2 View  Result Date: 10/22/2020 CLINICAL DATA:  Suspected sepsis. EXAM: CHEST - 2 VIEW COMPARISON:  08/02/2020 FINDINGS: Right jugular central venous catheter tip in the lower SVC. AICD in satisfactory position and unchanged. Interval placement of coils in the pulmonary vascularity posterior to the left atrium for pseudoaneurysm of the pulmonary artery. Mild bibasilar atelectasis similar to the prior study. Negative for pleural effusion. IMPRESSION: Mild bibasilar atelectasis unchanged from the prior study. Electronically Signed   By: Franchot Gallo M.D.   On: 10/22/2020 12:56   DG Pelvis 1-2 Views  Result Date: 10/23/2020 CLINICAL DATA:  Multiple recent falls. EXAM: PELVIS - 1-2 VIEW COMPARISON:  CT abdomen pelvis from yesterday. FINDINGS: There is no evidence of pelvic fracture or diastasis. Prior left total hip arthroplasty. Chronic slight periprosthetic lucency measuring less than 2 mm, within normal limits. Calcified granulomas in both buttocks. IMPRESSION: 1. No acute osseous abnormality. Electronically Signed   By: Titus Dubin M.D.   On: 10/23/2020 09:45   CT Head Wo Contrast  Result Date: 10/22/2020 CLINICAL DATA:  Multiple falls last week.  On anticoagulation. EXAM: CT HEAD WITHOUT CONTRAST TECHNIQUE: Contiguous axial images were obtained from the base of the skull through the vertex without intravenous contrast. COMPARISON:  Brain MRI, 12/01/2015. FINDINGS: Brain: No evidence of acute infarction, hemorrhage, hydrocephalus, extra-axial collection or mass lesion/mass effect. There is mild ventricular enlargement consistent with age appropriate volume loss. Mild periventricular white matter hypoattenuation is also noted consistent with  chronic microvascular ischemic change. Vascular: No hyperdense vessel or unexpected calcification. Skull: Normal. Negative for fracture or focal lesion. Sinuses/Orbits: Globes and orbits are unremarkable. Sinuses are clear. Other: None. IMPRESSION: 1. No acute intracranial abnormalities. 2. Age-appropriate volume loss and mild chronic microvascular ischemic change. Electronically Signed   By: Lajean Manes M.D.   On: 10/22/2020 15:50        Scheduled Meds: . amiodarone  200 mg Oral Daily  . magnesium oxide  200 mg Oral BID  . midodrine  10 mg Oral BID WC  . pantoprazole  40 mg Oral Daily  . potassium chloride SA  20 mEq Oral BID  . Rivaroxaban  15 mg Oral Q supper   Continuous Infusions: . sodium chloride 50 mL/hr at 10/23/20 1114  . milrinone 0.25 mcg/kg/min (10/23/20 1116)     LOS: 1 day    Time spent:25 mins. More than 50% of that time was  spent in counseling and/or coordination of care.      Shelly Coss, MD [facility] P2/08/2021, 11:56 AM

## 2020-10-23 NOTE — ED Notes (Signed)
Pt provided with Tylenol for head pain. Pt stated "I don't think that dosing of Ativan was correct and I want more please." Educated patient on MAR dosing and time administration. Pt placed back in bed and provided with warm blankets. Pt requesting monitor cables to be removed. Educated patient on importance of monitoring equipment. Pt stated understanding. Bed In low position with call bell within reach. No needs expressed at this time.

## 2020-10-23 NOTE — ED Notes (Signed)
Pt is refusing for the Milrinone Lactate to be given. Pt states that she is already receiving this medication through her CVC Double Lumen using a pump device that she was given by her doctor. Pt also states that she gets this medication every Wednesday and until its not completely out she will not get another dose initiated.

## 2020-10-23 NOTE — ED Notes (Signed)
RN unable to take report at this time 

## 2020-10-24 DIAGNOSIS — I428 Other cardiomyopathies: Secondary | ICD-10-CM | POA: Diagnosis not present

## 2020-10-24 DIAGNOSIS — I5023 Acute on chronic systolic (congestive) heart failure: Secondary | ICD-10-CM | POA: Diagnosis not present

## 2020-10-24 DIAGNOSIS — I480 Paroxysmal atrial fibrillation: Secondary | ICD-10-CM | POA: Diagnosis not present

## 2020-10-24 DIAGNOSIS — N179 Acute kidney failure, unspecified: Secondary | ICD-10-CM | POA: Diagnosis not present

## 2020-10-24 DIAGNOSIS — I130 Hypertensive heart and chronic kidney disease with heart failure and stage 1 through stage 4 chronic kidney disease, or unspecified chronic kidney disease: Secondary | ICD-10-CM | POA: Diagnosis not present

## 2020-10-24 DIAGNOSIS — R531 Weakness: Secondary | ICD-10-CM | POA: Diagnosis not present

## 2020-10-24 LAB — BASIC METABOLIC PANEL WITH GFR
Anion gap: 11 (ref 5–15)
BUN: 45 mg/dL — ABNORMAL HIGH (ref 8–23)
CO2: 20 mmol/L — ABNORMAL LOW (ref 22–32)
Calcium: 8.7 mg/dL — ABNORMAL LOW (ref 8.9–10.3)
Chloride: 104 mmol/L (ref 98–111)
Creatinine, Ser: 1.7 mg/dL — ABNORMAL HIGH (ref 0.44–1.00)
GFR, Estimated: 30 mL/min — ABNORMAL LOW
Glucose, Bld: 118 mg/dL — ABNORMAL HIGH (ref 70–99)
Potassium: 4.8 mmol/L (ref 3.5–5.1)
Sodium: 135 mmol/L (ref 135–145)

## 2020-10-24 MED ORDER — CALCIUM CARBONATE ANTACID 500 MG PO CHEW
1.0000 | CHEWABLE_TABLET | Freq: Three times a day (TID) | ORAL | Status: DC
  Administered 2020-10-24 – 2020-10-30 (×16): 200 mg via ORAL
  Filled 2020-10-24 (×18): qty 1

## 2020-10-24 NOTE — Progress Notes (Signed)
Manufacturing engineer [facility])  Discussed with PMT provider. Cheryl Vargas was our palliative patient in the community in the past. She has requested to restart services with [place] once she discharges.  [facility] will follow up with her in her home to re-start outpatient palliative services.  Venia Carbon RN, BSN, [facility] Liaison

## 2020-10-24 NOTE — Progress Notes (Signed)
PROGRESS NOTE    Cheryl Vargas  KTG:[id number] DOB: 1935-01-17 DOA: 10/22/2020 PCP: Lavone Orn, MD   Chief Complain: Weakness  Brief Narrative: Cheryl Vargas is a 86 y.o. female with medical history of nonischemic cardiomyopathy, chronic systolic congestive heart failure with ejection fraction of 20 to 25%, proximal A. fib, hyperlipidemia, hypertension, severe mitral valve regurgitation, CKD stage IIIb, Status post total left knee replacement who presents to the emergency department with complaints of generalized weakness, bilateral lower extremity swelling.  Patient states that she has been more fatigued than usual and also fell few times at home.  She follows with heart failure clinic.  She is on milrinone drip at home.  She follows with home health.  Patient also reported that she had a weight gain of 2 pounds in the last week. On presentation, she was also found to have AKI on CKD. Patient was not eating or drinking well since last few days at home. Start on gentle IV fluids. CHF team   following.  Assessment & Plan:   Principal Problem:   Acute exacerbation of CHF (congestive heart failure) (HCC) Active Problems:   Paroxysmal atrial fibrillation (HCC)   Non-ischemic cardiomyopathy (HCC)    Acute on chronic systolic congestive heart failure: Presented with bilateral lower extremity swelling, weight gain.  Takes Lasix and palliative milrinone at home.  Follows with home health therapy.  Poor candidate for LVAD Continue monitoring input/output, daily weight Started on  home milrinone dose. Last echocardiogram on 07/2020 had shown ejection fraction of 20 to 25%. Cardiology following  AKI on CKD stage IIIb: Her last creatinine was around 1.21.  Baseline creatinine around 1.2.  Presented with creatinine in the range of 2. Apparently we knew that she was taking more Lasix than usual and was having poor oral intake at home. Start on gentle IV fluids. Kidney function improving..  Avoid  nephrotoxins.  Monitor renal function  Troponin elevation: Denies any chest pain.  This is from supply demand ischemia from severe congestive heart failure.  Nonischemic cardiomyopathy: She underwent cardiac cath on 04/2016 which showed normal coronaries.  Paroxysmal A. fib: On Xarelto, amiodarone.  Status post AV node ablation and biventricular pacing.  Has history of frequent PVCs/VT.  History of severe MR/severe TR/moderate AI: Follows with cardiology.  Not a candidate for intervention.  Suspicion for UTI: She was prescribed ciprofloxacin recently which she was not taking.    Denies urinary symptoms.  UA not suspicious for UTI.  No indication for treatment  Goals of care: Elderly patient with multiple comorbidities.  On home milrinone therapy for palliative purpose.  CODE STATUS is DNR.  Cardiology has previously recommended transition to hospice if her overall condition worsens.    Palliative care has been following.  The goal is to discharge her to home with milrinone therapy  Debility/deconditioning/falls: Complains of generalized weakness, weight gain.  She is status post total left hip replacement.  Will request for PT/OT evaluation. She also has some bruise on her left hip after recent fall.  X-ray of the hip did not show any fracture        DVT prophylaxis:Xarelto Code Status: Full Family Communication: Discussed with son on phone on 10/22/2020 Status is: Inpatient  Remains inpatient appropriate because:Inpatient level of care appropriate due to severity of illness   Dispo: The patient is from: Home              Anticipated d/c is to: Home  Anticipated d/c date is: 2 days              Patient currently is not medically stable to d/c.   Difficult to place patient No  Needs cardiology clearance before discharge   Consultants: Cardiology  Procedures:None  Antimicrobials:  Anti-infectives (From admission, onward)   None      Subjective: Patient  seen and examined the bedside this morning.  Hemodynamically stable.  Denies any complaints today.  Had a good sleep last night.  Objective: Vitals:   10/23/20 2000 10/23/20 2200 10/23/20 2316 10/24/20 0309  BP: 117/65 109/62 117/73 125/68  Pulse:      Resp:   18 18  Temp:   97.8 F (36.6 C) (!) 96.8 F (36 C)  TempSrc:   Oral Axillary  SpO2:   93%   Weight:      Height:        Intake/Output Summary (Last 24 hours) at 10/24/2020 0820 Last data filed at 10/24/2020 0636 Gross per 24 hour  Intake 891.35 ml  Output --  Net 891.35 ml   Filed Weights   10/22/20 1306 10/23/20 1052  Weight: 52.6 kg 56.4 kg    Examination:  General exam: Very deconditioned, debilitated female, chronically looking, generalized weakness  Respiratory system: Bilateral equal air entry, normal vesicular breath sounds, no wheezes or crackles  Cardiovascular system: S1 & S2 heard, RRR. No JVD, murmurs, rubs, gallops or clicks. trace pedal edema. Pacemaker.  Central line on the right chest Gastrointestinal system: Abdomen is nondistended, soft and nontender. No organomegaly or masses felt. Normal bowel sounds heard. Central nervous system: Alert and oriented. No focal neurological deficits. Extremities: trace lower extremity  edema, no clubbing ,no cyanosis Skin: No rashes, lesions or ulcers,no icterus ,no pallor   Data Reviewed: I have personally reviewed following labs and imaging studies  CBC: Recent Labs  Lab 10/22/20 1227 10/23/20 0317  WBC 6.0 5.8  NEUTROABS 4.4  --   HGB 10.8* 10.6*  HCT 33.9* 32.4*  MCV 83.1 83.1  PLT 224 062   Basic Metabolic Panel: Recent Labs  Lab 10/22/20 1227 10/22/20 1513 10/23/20 0317 10/24/20 0259  NA 135  --  134* 135  K 4.1  --  3.8 4.8  CL 100  --  101 104  CO2 20*  --  22 20*  GLUCOSE 116*  --  111* 118*  BUN 50*  --  48* 45*  CREATININE 2.29*  --  1.95* 1.70*  CALCIUM 9.1  --  8.8* 8.7*  MG  --  1.5* 2.4  --    GFR: Estimated Creatinine  Clearance: 22.3 mL/min (A) (by C-G formula based on SCr of 1.7 mg/dL (H)). Liver Function Tests: Recent Labs  Lab 10/22/20 1227  AST 47*  ALT 39  ALKPHOS 66  BILITOT 1.5*  PROT 7.5  ALBUMIN 3.5   No results for input(s): LIPASE, AMYLASE in the last 168 hours. No results for input(s): AMMONIA in the last 168 hours. Coagulation Profile: Recent Labs  Lab 10/22/20 1227  INR 2.9*   Cardiac Enzymes: No results for input(s): CKTOTAL, CKMB, CKMBINDEX, TROPONINI in the last 168 hours. BNP (last 3 results) No results for input(s): PROBNP in the last 8760 hours. HbA1C: No results for input(s): HGBA1C in the last 72 hours. CBG: No results for input(s): GLUCAP in the last 168 hours. Lipid Profile: No results for input(s): CHOL, HDL, LDLCALC, TRIG, CHOLHDL, LDLDIRECT in the last 72 hours. Thyroid Function Tests: No results  for input(s): TSH, T4TOTAL, FREET4, T3FREE, THYROIDAB in the last 72 hours. Anemia Panel: No results for input(s): VITAMINB12, FOLATE, FERRITIN, TIBC, IRON, RETICCTPCT in the last 72 hours. Sepsis Labs: Recent Labs  Lab 10/22/20 1331  LATICACIDVEN 1.6    Recent Results (from the past 240 hour(s))  Culture, blood (Routine x 2)     Status: None (Preliminary result)   Collection Time: 10/22/20 12:27 PM   Specimen: BLOOD  Result Value Ref Range Status   Specimen Description BLOOD RIGHT ANTECUBITAL  Final   Special Requests   Final    BOTTLES DRAWN AEROBIC AND ANAEROBIC Blood Culture adequate volume   Culture   Final    NO GROWTH 1 DAY Performed at [facility], [place] [address] [place], [place] [address]    Report Status PENDING  Incomplete  Resp Panel by RT-PCR (Flu A&B, Covid) Nasopharyngeal Swab     Status: None   Collection Time: 10/22/20  2:26 PM   Specimen: Nasopharyngeal Swab; Nasopharyngeal(NP) swabs in vial transport medium  Result Value Ref Range Status   SARS Coronavirus 2 by RT PCR NEGATIVE NEGATIVE Final    Comment: (NOTE) SARS-CoV-2  target nucleic acids are NOT DETECTED.  The SARS-CoV-2 RNA is generally detectable in upper respiratory specimens during the acute phase of infection. The lowest concentration of SARS-CoV-2 viral copies this assay can detect is 138 copies/mL. A negative result does not preclude SARS-Cov-2 infection and should not be used as the sole basis for treatment or other patient management decisions. A negative result may occur with  improper specimen collection/handling, submission of specimen other than nasopharyngeal swab, presence of viral mutation(s) within the areas targeted by this assay, and inadequate number of viral copies(<138 copies/mL). A negative result must be combined with clinical observations, patient history, and epidemiological information. The expected result is Negative.  Fact Sheet for Patients:  EntrepreneurPulse.com.au  Fact Sheet for Healthcare Providers:  IncredibleEmployment.be  This test is no t yet approved or cleared by the [place] FDA and  has been authorized for detection and/or diagnosis of SARS-CoV-2 by FDA under an Emergency Use Authorization (EUA). This EUA will remain  in effect (meaning this test can be used) for the duration of the COVID-19 declaration under Section 564(b)(1) of the Act, 21 U.S.C.section 360bbb-3(b)(1), unless the authorization is terminated  or revoked sooner.       Influenza A by PCR NEGATIVE NEGATIVE Final   Influenza B by PCR NEGATIVE NEGATIVE Final    Comment: (NOTE) The Xpert Xpress SARS-CoV-2/FLU/RSV plus assay is intended as an aid in the diagnosis of influenza from Nasopharyngeal swab specimens and should not be used as a sole basis for treatment. Nasal washings and aspirates are unacceptable for Xpert Xpress SARS-CoV-2/FLU/RSV testing.  Fact Sheet for Patients: EntrepreneurPulse.com.au  Fact Sheet for Healthcare  Providers: IncredibleEmployment.be  This test is not yet approved or cleared by the [place] FDA and has been authorized for detection and/or diagnosis of SARS-CoV-2 by FDA under an Emergency Use Authorization (EUA). This EUA will remain in effect (meaning this test can be used) for the duration of the COVID-19 declaration under Section 564(b)(1) of the Act, 21 U.S.C. section 360bbb-3(b)(1), unless the authorization is terminated or revoked.  Performed at [facility], [place] [address] [place], [place] [address]          Radiology Studies: CT ABDOMEN PELVIS WO CONTRAST  Result Date: 10/22/2020 CLINICAL DATA:  Multiple falls x1 week with weakness on anticoagulants.  EXAM: CT ABDOMEN AND PELVIS WITHOUT CONTRAST TECHNIQUE: Multidetector CT imaging of the abdomen and pelvis was performed following the standard protocol without IV contrast. COMPARISON:  Feb 03, 2020 FINDINGS: Lower chest: No acute abnormality. Hepatobiliary: Stable cystic appearing areas are seen within the anterior aspect of the right lobe of the liver and medial aspect of the left lobe. No gallstones, gallbladder wall thickening, or biliary dilatation. Pancreas: Unremarkable. No pancreatic ductal dilatation or surrounding inflammatory changes. Spleen: Normal in size without focal abnormality. Adrenals/Urinary Tract: Adrenal glands are unremarkable. Kidneys are normal in size, without renal calculi or hydronephrosis. A stable 8.3 cm x 6.6 cm mildly complex cyst is seen within the right kidney. Bladder is limited in evaluation secondary to overlying streak artifact. Stomach/Bowel: There is a small hiatal hernia. Appendix appears normal. No evidence of bowel wall thickening, distention, or inflammatory changes. Noninflamed diverticula are seen throughout the sigmoid colon. Vascular/Lymphatic: Marked severity aortic calcification and atherosclerosis. No enlarged abdominal or pelvic lymph nodes.  Reproductive: The expected region of the uterus and bilateral adnexa is limited in evaluation secondary to overlying streak artifact. Other: No abdominal wall hernia or abnormality. No abdominopelvic ascites. Musculoskeletal: A total left hip replacement is seen. An extensive amount of associated streak artifact is noted with subsequently limited evaluation of the adjacent osseous and soft tissue structures. Multilevel degenerative changes seen throughout the lumbar spine. IMPRESSION: 1. Limited evaluation of the pelvis secondary to overlying streak artifact. 2. Stable cystic appearing areas within the liver and right kidney. 3. Sigmoid diverticulosis. 4. Small hiatal hernia. 5. Total left hip replacement. 6. Aortic atherosclerosis. Aortic Atherosclerosis (ICD10-I70.0). Electronically Signed   By: Virgina Norfolk M.D.   On: 10/22/2020 15:57   DG Chest 2 View  Result Date: 10/22/2020 CLINICAL DATA:  Suspected sepsis. EXAM: CHEST - 2 VIEW COMPARISON:  08/02/2020 FINDINGS: Right jugular central venous catheter tip in the lower SVC. AICD in satisfactory position and unchanged. Interval placement of coils in the pulmonary vascularity posterior to the left atrium for pseudoaneurysm of the pulmonary artery. Mild bibasilar atelectasis similar to the prior study. Negative for pleural effusion. IMPRESSION: Mild bibasilar atelectasis unchanged from the prior study. Electronically Signed   By: Franchot Gallo M.D.   On: 10/22/2020 12:56   DG Pelvis 1-2 Views  Result Date: 10/23/2020 CLINICAL DATA:  Multiple recent falls. EXAM: PELVIS - 1-2 VIEW COMPARISON:  CT abdomen pelvis from yesterday. FINDINGS: There is no evidence of pelvic fracture or diastasis. Prior left total hip arthroplasty. Chronic slight periprosthetic lucency measuring less than 2 mm, within normal limits. Calcified granulomas in both buttocks. IMPRESSION: 1. No acute osseous abnormality. Electronically Signed   By: Titus Dubin M.D.   On: 10/23/2020  09:45   CT Head Wo Contrast  Result Date: 10/22/2020 CLINICAL DATA:  Multiple falls last week.  On anticoagulation. EXAM: CT HEAD WITHOUT CONTRAST TECHNIQUE: Contiguous axial images were obtained from the base of the skull through the vertex without intravenous contrast. COMPARISON:  Brain MRI, 12/01/2015. FINDINGS: Brain: No evidence of acute infarction, hemorrhage, hydrocephalus, extra-axial collection or mass lesion/mass effect. There is mild ventricular enlargement consistent with age appropriate volume loss. Mild periventricular white matter hypoattenuation is also noted consistent with chronic microvascular ischemic change. Vascular: No hyperdense vessel or unexpected calcification. Skull: Normal. Negative for fracture or focal lesion. Sinuses/Orbits: Globes and orbits are unremarkable. Sinuses are clear. Other: None. IMPRESSION: 1. No acute intracranial abnormalities. 2. Age-appropriate volume loss and mild chronic microvascular ischemic change. Electronically Signed  By: Lajean Manes M.D.   On: 10/22/2020 15:50        Scheduled Meds: . amiodarone  200 mg Oral Daily  . Chlorhexidine Gluconate Cloth  6 each Topical Daily  . magnesium oxide  200 mg Oral BID  . midodrine  10 mg Oral BID WC  . pantoprazole  40 mg Oral Daily  . potassium chloride SA  20 mEq Oral BID  . Rivaroxaban  15 mg Oral Q supper  . rOPINIRole  0.5 mg Oral Q2000   Continuous Infusions: . sodium chloride 50 mL/hr at 10/24/20 0548  . milrinone 0.25 mcg/kg/min (10/24/20 0546)     LOS: 2 days    Time spent:25 mins. More than 50% of that time was spent in counseling and/or coordination of care.      Shelly Coss, MD [facility] P2/13/2022, 8:20 AM

## 2020-10-24 NOTE — Progress Notes (Signed)
Progress Note  Patient Name: Cheryl Vargas Date of Encounter: 10/24/2020  [facility] Cardiologist: [name], MD   Subjective   States she is feeling much better. Slept well last night as her RLS was well controlled.  Cr improving with IVF 1.95-->1.7  Inpatient Medications    Scheduled Meds: . amiodarone  200 mg Oral Daily  . Chlorhexidine Gluconate Cloth  6 each Topical Daily  . magnesium oxide  200 mg Oral BID  . midodrine  10 mg Oral BID WC  . pantoprazole  40 mg Oral Daily  . potassium chloride SA  20 mEq Oral BID  . Rivaroxaban  15 mg Oral Q supper  . rOPINIRole  0.5 mg Oral Q2000   Continuous Infusions: . sodium chloride 50 mL/hr at 10/24/20 0548  . milrinone 0.25 mcg/kg/min (10/24/20 0546)   PRN Meds: acetaminophen, LORazepam, ondansetron, senna-docusate, zolpidem   Vital Signs    Vitals:   10/23/20 2000 10/23/20 2200 10/23/20 2316 10/24/20 0309  BP: 117/65 109/62 117/73 125/68  Pulse:      Resp:   18 18  Temp:   97.8 F (36.6 C) (!) 96.8 F (36 C)  TempSrc:   Oral Axillary  SpO2:   93%   Weight:      Height:        Intake/Output Summary (Last 24 hours) at 10/24/2020 0826 Last data filed at 10/24/2020 0636 Gross per 24 hour  Intake 891.35 ml  Output --  Net 891.35 ml   Last 3 Weights 10/23/2020 10/22/2020 09/08/2020  Weight (lbs) 124 lb 6.4 oz 115 lb 15.4 oz 116 lb  Weight (kg) 56.427 kg 52.6 kg 52.617 kg      Telemetry    V-paced - Personally Reviewed  ECG    No new tracing - Personally Reviewed  Physical Exam   GEN: No acute distress. Frail appearing Neck: No JVD.  Cardiac: RRR, 2/6 holosystolic murmur. No rubs, or gallops.  Respiratory: Clear to auscultation bilaterally. GI: Soft, nontender, non-distended  MS: No edema; No deformity. Neuro:  Nonfocal  Psych: Normal affect   Labs    High Sensitivity Troponin:   Recent Labs  Lab 10/22/20 1339 10/22/20 1513  TROPONINIHS 48* 41*      Chemistry Recent Labs  Lab  10/22/20 1227 10/23/20 0317 10/24/20 0259  NA 135 134* 135  K 4.1 3.8 4.8  CL 100 101 104  CO2 20* 22 20*  GLUCOSE 116* 111* 118*  BUN 50* 48* 45*  CREATININE 2.29* 1.95* 1.70*  CALCIUM 9.1 8.8* 8.7*  PROT 7.5  --   --   ALBUMIN 3.5  --   --   AST 47*  --   --   ALT 39  --   --   ALKPHOS 66  --   --   BILITOT 1.5*  --   --   GFRNONAA 21* 25* 30*  ANIONGAP 15 11 11      Hematology Recent Labs  Lab 10/22/20 1227 10/23/20 0317  WBC 6.0 5.8  RBC 4.08 3.90  HGB 10.8* 10.6*  HCT 33.9* 32.4*  MCV 83.1 83.1  MCH 26.5 27.2  MCHC 31.9 32.7  RDW 16.1* 16.2*  PLT 224 216    BNP Recent Labs  Lab 10/22/20 1339  BNP 1,503.8*     DDimer No results for input(s): DDIMER in the last 168 hours.   Radiology    CT ABDOMEN PELVIS WO CONTRAST  Result Date: 10/22/2020 CLINICAL DATA:  Multiple falls  x1 week with weakness on anticoagulants. EXAM: CT ABDOMEN AND PELVIS WITHOUT CONTRAST TECHNIQUE: Multidetector CT imaging of the abdomen and pelvis was performed following the standard protocol without IV contrast. COMPARISON:  Feb 03, 2020 FINDINGS: Lower chest: No acute abnormality. Hepatobiliary: Stable cystic appearing areas are seen within the anterior aspect of the right lobe of the liver and medial aspect of the left lobe. No gallstones, gallbladder wall thickening, or biliary dilatation. Pancreas: Unremarkable. No pancreatic ductal dilatation or surrounding inflammatory changes. Spleen: Normal in size without focal abnormality. Adrenals/Urinary Tract: Adrenal glands are unremarkable. Kidneys are normal in size, without renal calculi or hydronephrosis. A stable 8.3 cm x 6.6 cm mildly complex cyst is seen within the right kidney. Bladder is limited in evaluation secondary to overlying streak artifact. Stomach/Bowel: There is a small hiatal hernia. Appendix appears normal. No evidence of bowel wall thickening, distention, or inflammatory changes. Noninflamed diverticula are seen throughout  the sigmoid colon. Vascular/Lymphatic: Marked severity aortic calcification and atherosclerosis. No enlarged abdominal or pelvic lymph nodes. Reproductive: The expected region of the uterus and bilateral adnexa is limited in evaluation secondary to overlying streak artifact. Other: No abdominal wall hernia or abnormality. No abdominopelvic ascites. Musculoskeletal: A total left hip replacement is seen. An extensive amount of associated streak artifact is noted with subsequently limited evaluation of the adjacent osseous and soft tissue structures. Multilevel degenerative changes seen throughout the lumbar spine. IMPRESSION: 1. Limited evaluation of the pelvis secondary to overlying streak artifact. 2. Stable cystic appearing areas within the liver and right kidney. 3. Sigmoid diverticulosis. 4. Small hiatal hernia. 5. Total left hip replacement. 6. Aortic atherosclerosis. Aortic Atherosclerosis (ICD10-I70.0). Electronically Signed   By: Virgina Norfolk M.D.   On: 10/22/2020 15:57   DG Chest 2 View  Result Date: 10/22/2020 CLINICAL DATA:  Suspected sepsis. EXAM: CHEST - 2 VIEW COMPARISON:  08/02/2020 FINDINGS: Right jugular central venous catheter tip in the lower SVC. AICD in satisfactory position and unchanged. Interval placement of coils in the pulmonary vascularity posterior to the left atrium for pseudoaneurysm of the pulmonary artery. Mild bibasilar atelectasis similar to the prior study. Negative for pleural effusion. IMPRESSION: Mild bibasilar atelectasis unchanged from the prior study. Electronically Signed   By: Franchot Gallo M.D.   On: 10/22/2020 12:56   DG Pelvis 1-2 Views  Result Date: 10/23/2020 CLINICAL DATA:  Multiple recent falls. EXAM: PELVIS - 1-2 VIEW COMPARISON:  CT abdomen pelvis from yesterday. FINDINGS: There is no evidence of pelvic fracture or diastasis. Prior left total hip arthroplasty. Chronic slight periprosthetic lucency measuring less than 2 mm, within normal limits.  Calcified granulomas in both buttocks. IMPRESSION: 1. No acute osseous abnormality. Electronically Signed   By: Titus Dubin M.D.   On: 10/23/2020 09:45   CT Head Wo Contrast  Result Date: 10/22/2020 CLINICAL DATA:  Multiple falls last week.  On anticoagulation. EXAM: CT HEAD WITHOUT CONTRAST TECHNIQUE: Contiguous axial images were obtained from the base of the skull through the vertex without intravenous contrast. COMPARISON:  Brain MRI, 12/01/2015. FINDINGS: Brain: No evidence of acute infarction, hemorrhage, hydrocephalus, extra-axial collection or mass lesion/mass effect. There is mild ventricular enlargement consistent with age appropriate volume loss. Mild periventricular white matter hypoattenuation is also noted consistent with chronic microvascular ischemic change. Vascular: No hyperdense vessel or unexpected calcification. Skull: Normal. Negative for fracture or focal lesion. Sinuses/Orbits: Globes and orbits are unremarkable. Sinuses are clear. Other: None. IMPRESSION: 1. No acute intracranial abnormalities. 2. Age-appropriate volume loss and mild chronic  microvascular ischemic change. Electronically Signed   By: Lajean Manes M.D.   On: 10/22/2020 15:50    Cardiac Studies   TTE 08/02/20: IMPRESSIONS  1. Left ventricular ejection fraction, by estimation, is 20 to 25%. The  left ventricle has severely decreased function. The left ventricle  demonstrates global hypokinesis. There is mild left ventricular  hypertrophy. Left ventricular diastolic parameters  are indeterminate.  2. Right ventricular systolic function is low normal. The right  ventricular size is normal. A n AICD wire is visualized. There is mildly  elevated pulmonary artery systolic pressure. The estimated right  ventricular systolic pressure is 86.7 mmHg.  3. Left atrial size was severely dilated.  4. Right atrial size was severely dilated.  5. The pericardial effusion is posterior to the left ventricle.  6.  The mitral valve is abnormal. Severe mitral valve regurgitation.  7. The tricuspid valve is abnormal. Tricuspid valve regurgitation is  severe.  8. The aortic valve is abnormal. Aortic valve regurgitation is moderate.  Mild aortic valve sclerosis is present, with no evidence of aortic valve  stenosis.  9. The inferior vena cava is normal in size with <50% respiratory  variability, suggesting right atrial pressure of 8 mmHg.   Comparison(s): Changes from prior study are noted. 09/18/2019: LVEF <20%,  severe biatrial enlargment, mild to moderate MR, mild AI.   Patient Profile     86 y.o. female with history of advanced heart failure on chronic milrinone who presented with weakness and malaise. Cardiology is consulted for ongoing management of chronic advanced HF.  Assessment & Plan    #End-stage chronic systolic heart failure with nonischemic cardiomyopathy EF 20 to 25% with severe mitral gravitation severe tricuspid regurgitation and moderate aortic insufficiency -Class III-IV symptoms.  On home milrinone. -She is off of her losartan, bisoprolol, spironolactone secondary to symptomatic hypotension and is on midodrine 10 mg twice daily. -Continue with milrinone 0.25 mcg/kg/min -She is off of digoxin secondary to elevated levels. -She is not a candidate for advanced therapy such as LVAD. -Cased discussed between Dr. Marlou Porch and Dr. [name].  Plan previously was to continue with palliative home milrinone and if worsening occurred to transition to hospice -Per palliative consult, plan is to continue current medical care with goal to return home and continue milrinone and transition to hospice only if she clinically declines  #Permanent atrial fibrillation -Status post AV nodal ablation with BiV pacing -On Xarelto 15 mg a day.  #PVC/VT -Previously frequent PVCs have limited BiV pacing.  She cannot tolerate sotalol or Tikosyn.  Could not tolerate mexiletine because of nausea.  She  also had nausea with rhythm no wheezing.  Seen to be able to tolerate low-dose amiodarone.   -Continue low dose amiodarone  #Mitral regurgitation -Severe on most recent echocardiogram.  Not a candidate for intervention.  #Acute kidney injury: -Suspected worsening renal function in the setting of over diuresis as patient was taking additional lasix at home -Lasix held on admission and given gentle IVF with Cr down-trending to 1.7 -Continue to hold diuresis today and continue with IVF hydration and monitor renal function -Will need to determine outpatient diuretic regimen once renal function close to baseline      For questions or updates, please contact [place] Please consult [email or web address] for contact info under        Signed, Freada Bergeron, MD  10/24/2020, 8:26 AM

## 2020-10-24 NOTE — Progress Notes (Signed)
Daily Progress Note   Patient Name: Cheryl Vargas       Date: 10/24/2020 DOB: 08-22-1935  Age: 86 y.o. MRN#: [id number] Attending Physician: Shelly Coss, MD Primary Care Physician: Lavone Orn, MD Admit Date: 10/22/2020  Reason for Follow-up: continued GOC discussion  Subjective: Patient states she feels "ok", but does feel better since she was admitted. Continues to endorse weakness.   I completed a MOST form today. The patient outlined her wishes for the following treatment decisions:  Cardiopulmonary Resuscitation: Do Not Attempt Resuscitation (DNR/No CPR)  Medical Interventions: Limited Additional Interventions: Use medical treatment, IV fluids and cardiac monitoring as indicated, DO NOT USE intubation or mechanical ventilation. May consider use of less invasive airway support such as BiPAP or CPAP. Also provide comfort measures. Transfer to the hospital if indicated. Avoid intensive care.   Antibiotics: Antibiotics if indicated  IV Fluids: IV fluids for a defined trial period  Feeding Tube: Feeding tube for a defined trial period   Discussed with patient the option for outpatient palliative follow-up. She was receiving this service previously with [facility]. I inquired why she stopped this service - she states there was a language barrier with the provider assigned to her.  I encouraged her to consider re-enrolling in this service, because they could help transition to hospice when the time came. She agrees, ans asks if a different provider can see her. She also shares she would want to do hospice at home when the time comes.   Length of Stay: 2  Current Medications: Scheduled Meds:  . amiodarone  200 mg Oral Daily  . calcium carbonate  1 tablet Oral TID WC  .  Chlorhexidine Gluconate Cloth  6 each Topical Daily  . magnesium oxide  200 mg Oral BID  . midodrine  10 mg Oral BID WC  . pantoprazole  40 mg Oral Daily  . potassium chloride SA  20 mEq Oral BID  . Rivaroxaban  15 mg Oral Q supper  . rOPINIRole  0.5 mg Oral Q2000    Continuous Infusions: . sodium chloride 50 mL/hr at 10/24/20 0548  . milrinone 0.25 mcg/kg/min (10/24/20 0546)    PRN Meds: acetaminophen, LORazepam, ondansetron, senna-docusate, zolpidem  Physical Exam Vitals reviewed.  Constitutional:      General: She is not in acute distress.  Appearance: She is ill-appearing.     Comments: Frail   Cardiovascular:     Comments: Paced rhythm with pauses Pulmonary:     Effort: Pulmonary effort is normal.  Neurological:     Mental Status: She is alert and oriented to person, place, and time.     Motor: Weakness present.             Vital Signs: BP 124/72   Pulse 79   Temp (!) 96.8 F (36 C) (Axillary)   Resp 18   Ht 5\' 5"  (1.651 m)   Wt 56.4 kg   SpO2 95%   BMI 20.70 kg/m  SpO2: SpO2: 95 % O2 Device: O2 Device: Room Air O2 Flow Rate:    Intake/output summary:   Intake/Output Summary (Last 24 hours) at 10/24/2020 1500 Last data filed at 10/24/2020 1000 Gross per 24 hour  Intake 1131.35 ml  Output --  Net 1131.35 ml   LBM: Last BM Date: 10/22/20 Baseline Weight: Weight: 52.6 kg Most recent weight: Weight: 56.4 kg       Palliative Assessment/Data: PPS 40%       Palliative Care Assessment & Plan   HPI/Patient Profile: 86 y.o. female  with past medical history of nonischemic cardiomyopathy, chronic systolic CHF with EF of 97-02%, paroxysmal atrial fibrillation, severe mitral valve regurgitation, HTN, HLD, and CKD stage IIIb. She presented to the emergency department on 10/22/2020 with complaints of generalized weakness and falling at home.She also reports fatigue and possible UTI. She was hospitalized 08/02/20--08/18/20 with acute on chronic heart failure,  discharged home with [facility] and PT. She has been on milrinone infusion  at home. ED Course: Lab work showed AKI on CKD, hypomagnesemia, elevated BNP, mildly elevated troponin. Chest x-ray showed bibasilar atelectasis, no pneumonia.    Per heart failure team, patient is not a candidate for LVAD. Also not a candidate for intervention for severe mitral regurgitation.    Assessment: - acute exacerbation of CHF - end-stage heart failure, inotrope dependent - AKI on CKD stage IIIb - nonischemic cardiomyopathy - paroxysmal A-fib - severe MR/severe TR, not a candidate for intervention - debility/generalized weakness  Recommendations/Plan:  DNR/DNI as previously documented  Continue current medical care  Patient would not want to be intubated again  Goal of care is to return home and continue milrinone  Heart failure team has recommended transition to hospice if her condition declines - I fully agree  Patient is agreeable to re-enroll in outpatient palliative services with [facility] - I have spoken with their liaison  No additional PMT needs at this time, please call [phone] if we can be of additional assistance or need to actively re-engage with this patient and family   Goals of Care and Additional Recommendations:  See MOST form  Code Status: DNR/DNI  Prognosis:   difficult to determine, months  Discharge Planning:  Home with [place] was discussed with hospice liaison  Thank you for allowing the Palliative Medicine Team to assist in the care of this patient.   Total Time 25 minutes Prolonged Time Billed  no      Greater than 50%  of this time was spent counseling and coordinating care related to the above assessment and plan.  Lavena Bullion, NP  Please contact Palliative Medicine Team phone at [phone] for questions and concerns.

## 2020-10-24 NOTE — Progress Notes (Signed)
Pharmacist Heart Failure Core Measure Documentation  Assessment: Elmire Amrein Blasdell has an EF documented as 20-25% on 08/02/20 by ECHO.  Rationale: Heart failure patients with left ventricular systolic dysfunction (LVSD) and an EF < 40% should be prescribed an angiotensin converting enzyme inhibitor (ACEI) or angiotensin receptor blocker (ARB) at discharge unless a contraindication is documented in the medical record.  This patient is not currently on an ACEI or ARB for HF.  This note is being placed in the record in order to provide documentation that a contraindication to the use of these agents is present for this encounter.  ACE Inhibitor or Angiotensin Receptor Blocker is contraindicated (specify all that apply)  []   ACEI allergy AND ARB allergy []   Angioedema []   Moderate or severe aortic stenosis []   Hyperkalemia  [x]   Hypotension []   Renal artery stenosis [x]   Worsening renal function, preexisting renal disease or dysfunction   Shauna Hugh, PharmD, [place]  PGY-1 Pharmacy Resident 10/24/2020 2:12 PM  Please check [email or web address] for unit-specific pharmacy phone numbers.

## 2020-10-25 ENCOUNTER — Encounter (HOSPITAL_COMMUNITY): Payer: Self-pay | Admitting: Internal Medicine

## 2020-10-25 DIAGNOSIS — I493 Ventricular premature depolarization: Secondary | ICD-10-CM

## 2020-10-25 DIAGNOSIS — E875 Hyperkalemia: Secondary | ICD-10-CM | POA: Diagnosis not present

## 2020-10-25 DIAGNOSIS — I482 Chronic atrial fibrillation, unspecified: Secondary | ICD-10-CM

## 2020-10-25 DIAGNOSIS — I428 Other cardiomyopathies: Secondary | ICD-10-CM | POA: Diagnosis not present

## 2020-10-25 DIAGNOSIS — I480 Paroxysmal atrial fibrillation: Secondary | ICD-10-CM | POA: Diagnosis not present

## 2020-10-25 DIAGNOSIS — I5023 Acute on chronic systolic (congestive) heart failure: Secondary | ICD-10-CM | POA: Diagnosis not present

## 2020-10-25 LAB — BASIC METABOLIC PANEL
Anion gap: 8 (ref 5–15)
BUN: 42 mg/dL — ABNORMAL HIGH (ref 8–23)
CO2: 19 mmol/L — ABNORMAL LOW (ref 22–32)
Calcium: 8.4 mg/dL — ABNORMAL LOW (ref 8.9–10.3)
Chloride: 106 mmol/L (ref 98–111)
Creatinine, Ser: 1.55 mg/dL — ABNORMAL HIGH (ref 0.44–1.00)
GFR, Estimated: 33 mL/min — ABNORMAL LOW (ref >60)
Glucose, Bld: 115 mg/dL — ABNORMAL HIGH (ref 70–99)
Potassium: 5.3 mmol/L — ABNORMAL HIGH (ref 3.5–5.1)
Sodium: 133 mmol/L — ABNORMAL LOW (ref 135–145)

## 2020-10-25 LAB — COOXEMETRY PANEL
Carboxyhemoglobin: 1.1 % (ref 0.5–1.5)
Methemoglobin: 0.7 % (ref 0.0–1.5)
O2 Saturation: 45.6 %
Total hemoglobin: 10.6 g/dL — ABNORMAL LOW (ref 12.0–16.0)

## 2020-10-25 MED ORDER — CARVEDILOL 3.125 MG PO TABS: 3.1250 mg | ORAL_TABLET | Freq: Two times a day (BID) | ORAL | Status: DC

## 2020-10-25 MED ORDER — OXYCODONE HCL 5 MG PO TABS
5.0000 mg | ORAL_TABLET | Freq: Four times a day (QID) | ORAL | Status: DC | PRN
  Administered 2020-10-27: 5 mg via ORAL
  Filled 2020-10-25: qty 1

## 2020-10-25 MED ORDER — ONDANSETRON HCL 4 MG PO TABS
4.0000 mg | ORAL_TABLET | Freq: Three times a day (TID) | ORAL | Status: DC | PRN
  Administered 2020-10-25: 4 mg via ORAL
  Filled 2020-10-25: qty 1

## 2020-10-25 MED ORDER — ONDANSETRON HCL 4 MG/2ML IJ SOLN
INTRAMUSCULAR | Status: AC
  Filled 2020-10-25: qty 2

## 2020-10-25 MED ORDER — PROMETHAZINE HCL 25 MG/ML IJ SOLN: 12.5000 mg | Freq: Two times a day (BID) | INTRAMUSCULAR | Status: DC | PRN

## 2020-10-25 MED ORDER — FUROSEMIDE 10 MG/ML IJ SOLN
60.0000 mg | Freq: Two times a day (BID) | INTRAMUSCULAR | Status: DC
  Administered 2020-10-25: 60 mg via INTRAVENOUS
  Filled 2020-10-25: qty 6

## 2020-10-25 MED ORDER — ONDANSETRON HCL 4 MG/2ML IJ SOLN
4.0000 mg | Freq: Four times a day (QID) | INTRAMUSCULAR | Status: DC | PRN
  Administered 2020-10-25 – 2020-10-26 (×4): 4 mg via INTRAVENOUS
  Filled 2020-10-25 (×4): qty 2

## 2020-10-25 MED ORDER — SODIUM ZIRCONIUM CYCLOSILICATE 5 G PO PACK
5.0000 g | PACK | Freq: Once | ORAL | Status: AC
  Administered 2020-10-25: 5 g via ORAL
  Filled 2020-10-25: qty 1

## 2020-10-25 NOTE — Evaluation (Signed)
Physical Therapy Evaluation Patient Details Name: Cheryl Vargas MRN: [id number] DOB: Aug 23, 1935 Today's Date: 10/25/2020   History of Present Illness  Pt is a 86 y.o. female with medical history of nonischemic cardiomyopathy, chronic systolic congestive heart failure with ejection fraction of 20 to 25%, proximal A. fib, hyperlipidemia, hypertension, severe mitral valve regurgitation, CKD stage IIIb, L TKA who presents to the emergency department with complaints of generalized weakness, bilateral lower extremity swelling.  Patient states that she has been more fatigued than usual and also fell few times at home.  She follows with heart failure clinic.  She is on milrinone drip at home. Pt admitted with acute on chronic CHF and AKI. Noted pt with outpt palliative services in the past and plans to restart at discharge.    Clinical Impression  Pt admitted with above diagnosis. Pt was min A of 2 for transfer to chair at evaluation.  Limited distance ambulation due to pt reports not feeling well/not much sleep and wanting to rest.  Pt has intermittent support at home and DME including w/c.  She has had a hx of falls recently.  Noted plan to d/c with palliative services.  Pt currently with functional limitations due to the deficits listed below (see PT Problem List). Pt will benefit from skilled PT to increase their independence and safety with mobility to allow discharge to the venue listed below.  Pt reports tried home with caregivers in the past but had a bad experience with the caregivers.  Pt could benefit from increased support at home.     Follow Up Recommendations Home health PT;Supervision for mobility/OOB    Equipment Recommendations  None recommended by PT (has DME)    Recommendations for Other Services       Precautions / Restrictions Precautions Precautions: Fall Precaution Comments: reports multiple falls      Mobility  Bed Mobility Overal bed mobility: Needs Assistance Bed  Mobility: Supine to Sit     Supine to sit: Min guard          Transfers Overall transfer level: Needs assistance Equipment used: 2 person hand held assist Transfers: Sit to/from Stand Sit to Stand: Min assist;+2 physical assistance         General transfer comment: Min A of 2 for HHA support  Ambulation/Gait Ambulation/Gait assistance: Min assist;+2 physical assistance Gait Distance (Feet): 3 Feet Assistive device: 2 person hand held assist Gait Pattern/deviations: Step-to pattern;Decreased stride length Gait velocity: decreased   General Gait Details: Min A of 2 HHA steps to chair.  Ambulation limited due to pt not feeling well, cold, and did not sleep well last night.  Stairs            Wheelchair Mobility    Modified Rankin (Stroke Patients Only)       Balance Overall balance assessment: Needs assistance Sitting-balance support: No upper extremity supported Sitting balance-Leahy Scale: Fair     Standing balance support: Bilateral upper extremity supported Standing balance-Leahy Scale: Poor Standing balance comment: requiring at least single UE support                             Pertinent Vitals/Pain Pain Assessment: No/denies pain    Home Living Family/patient expects to be discharged to:: Other (Comment) (home with palliative services) Living Arrangements: Alone             Home Equipment: Shower seat;Hand held shower head;Walker - 4 wheels;Toilet riser;Grab bars -  toilet;Walker - 2 wheels;Cane - single point;Wheelchair - manual      Prior Function Level of Independence: Needs assistance   Gait / Transfers Assistance Needed: walks with rollator  ADL's / Homemaking Assistance Needed: independent in self care, assisted for IADL        Hand Dominance   Dominant Hand: Right    Extremity/Trunk Assessment   Upper Extremity Assessment Upper Extremity Assessment: Defer to OT evaluation    Lower Extremity  Assessment Lower Extremity Assessment: Generalized weakness    Cervical / Trunk Assessment Cervical / Trunk Assessment: Kyphotic  Communication   Communication: No difficulties  Cognition Arousal/Alertness: Awake/alert Behavior During Therapy: WFL for tasks assessed/performed Overall Cognitive Status: Within Functional Limits for tasks assessed                                        General Comments General comments (skin integrity, edema, etc.): VSS on RA    Exercises     Assessment/Plan    PT Assessment Patient needs continued PT services  PT Problem List Decreased strength;Decreased mobility;Decreased range of motion;Decreased activity tolerance;Cardiopulmonary status limiting activity;Decreased balance;Decreased knowledge of use of DME       PT Treatment Interventions DME instruction;Therapeutic activities;Gait training;Therapeutic exercise;Patient/family education;Balance training;Functional mobility training;Wheelchair mobility training    PT Goals (Current goals can be found in the Care Plan section)  Acute Rehab PT Goals Patient Stated Goal: return home; get warm and feel better PT Goal Formulation: With patient Time For Goal Achievement: 11/08/20 Potential to Achieve Goals: Fair    Frequency Min 3X/week   Barriers to discharge        Co-evaluation PT/OT/SLP Co-Evaluation/Treatment: Yes Reason for Co-Treatment: For patient/therapist safety (hx of recent falls) PT goals addressed during session: Mobility/safety with mobility         AM-PAC PT "6 Clicks" Mobility  Outcome Measure Help needed turning from your back to your side while in a flat bed without using bedrails?: A Little Help needed moving from lying on your back to sitting on the side of a flat bed without using bedrails?: A Little Help needed moving to and from a bed to a chair (including a wheelchair)?: A Little Help needed standing up from a chair using your arms (e.g.,  wheelchair or bedside chair)?: A Little Help needed to walk in hospital room?: A Little Help needed climbing 3-5 steps with a railing? : A Little 6 Click Score: 18    End of Session   Activity Tolerance: Patient tolerated treatment well Patient left: in chair;with call bell/phone within reach;with nursing/sitter in room Nurse Communication: Mobility status PT Visit Diagnosis: Muscle weakness (generalized) (M62.81);Unsteadiness on feet (R26.81);Repeated falls (R29.6)    Time: 7782-4235 PT Time Calculation (min) (ACUTE ONLY): 18 min   Charges:   PT Evaluation $PT Eval Low Complexity: 1 Low          Christepher Melchior, PT Acute Rehab Services Pager [phone] [facility] [phone]    Karlton Lemon 10/25/2020, 12:38 PM

## 2020-10-25 NOTE — Consult Note (Addendum)
Advanced Heart Failure Team Consult Note   Primary Physician: Lavone Orn, MD HF-Cardiologist: Dr Aundra Dubin  Reason for Consultation: Heart Failure   HPI:    Cheryl Vargas is seen today for evaluation of heart failure at the request of Dr Tawanna Solo.   Cheryl Vargas is a 86 year old with a history of HTN, HLD, PAF, NICM, VT, Medtronic CRT-D, and chronic systolic heart failure on home milrinone.  Echo in 12/21 showed EF 20-25% with severe MR, severe TR, and moderate AI.  Patient had [place] in 12/21 due to concern for low output HF.  RHC did show low cardiac output, but this was complicated by localized branch PA perforation requiring coil embolization by IR.  She was started on milrinone with improvement in symptoms though was still very weak.  Given frailty, she was thought to be poor candidate for LVAD placement.  We sent her home on milrinone with home health with plan to transition to hospice with any worsening.   In January Optivol was suggestive of fluid accumulation. On 2/1 Dr Aundra Dubin recommended increasing lasix to 40 mg twice a day x 5 days then back to lasix 40 mg/20 mg. She canceled appointment with Dr Aundra Dubin on 10/14/20.  On 2/7 suspected UTI. Prescribed Cipro but never started due to allergy. Lives alone and has HH.   Prior to admit she had  9 falls. Presented to [facility] with fatigue and leg weakness. CT head negative. Creatinine on admit 2.3. Other pertinent labs included : WBC 6, BNP 1503, lactic acid 1.6, blood cultures NGTD. Cardiology consulted and she was given IV lasix x1 then diuretics stopped and IV fluids started. Remains on milrinone 0.25 mcg.   CT abd- sigmoid diverticulosis, small hh, and stable cyst appearing areas liver/R kidney.   Creatinine has been trending down 2.3>-->today 1.5.   No output recorded. No weight recorded.   Feels terrible. Did not sleep well   07/2020 Echo EF 20-25% RV normal.  Review of Systems: [y] = yes, [ ]  = no   . General: Weight gain [ ] ;  Weight loss [ ] ; Anorexia [ ] ; Fatigue [ Y]; Fever [ ] ; Chills [ ] ; Weakness [Y ]  . Cardiac: Chest pain/pressure [ ] ; Resting SOB [ ] ; Exertional SOB [ ] ; Orthopnea [ ] ; Pedal Edema [ ] ; Palpitations [ ] ; Syncope [ ] ; Presyncope [ ] ; Paroxysmal nocturnal dyspnea[ ]   . Pulmonary: Cough [ ] ; Wheezing[ ] ; Hemoptysis[ ] ; Sputum [ ] ; Snoring [ ]   . GI: Vomiting[ ] ; Dysphagia[ ] ; Melena[ ] ; Hematochezia [ ] ; Heartburn[ ] ; Abdominal pain [ ] ; Constipation [ ] ; Diarrhea [ ] ; BRBPR [ ]   . GU: Hematuria[ ] ; Dysuria [ ] ; Nocturia[ ]   . Vascular: Pain in legs with walking [ ] ; Pain in feet with lying flat [ ] ; Non-healing sores [ ] ; Stroke [ ] ; TIA [ ] ; Slurred speech [ ] ;  . Neuro: Headaches[ ] ; Vertigo[ ] ; Seizures[ ] ; Paresthesias[ ] ;Blurred vision [ ] ; Diplopia [ ] ; Vision changes [ ]   . Ortho/Skin: Arthritis [ ] ; Joint pain [Y ]; Muscle pain [ ] ; Joint swelling [ ] ; Back Pain [Y ]; Rash [ ]   . Psych: Depression[ ] ; Anxiety[ ]   . Heme: Bleeding problems [ ] ; Clotting disorders [ ] ; Anemia [ ]   . Endocrine: Diabetes [ ] ; Thyroid dysfunction[ ]   Home Medications Prior to Admission medications   Medication Sig Start Date End Date Taking? Authorizing Provider  acetaminophen (TYLENOL) 500 MG tablet Take 500-1,000  mg by mouth every 6 (six) hours as needed for headache (pain).   Yes [provider]  amiodarone (PACERONE) 200 MG tablet Take 1 tablet (200 mg total) by mouth daily. 08/18/20  Yes Clegg, [name] D, NP  Calcium Carbonate Antacid (TUMS ULTRA 1000 PO) Take 1,000 mg by mouth 2 (two) times daily as needed (heartburn/indigestion).   Yes [provider]  fluticasone (FLONASE) 50 MCG/ACT nasal spray Place 1 spray into both nostrils daily as needed for allergies or rhinitis (seasonal allergies).   Yes [provider]  furosemide (LASIX) 40 MG tablet Take 1 tablet (40 mg total) every morning and 0.5 tablet (20 mg total) every evening. Patient taking differently: Take 20-40 mg by mouth  See admin instructions. Take 1 tablet (40 mg total) every morning and 0.5 tablet (20 mg total) every evening. 10/22/20  Yes Larey Dresser, MD  LORazepam (ATIVAN) 0.5 MG tablet Take 1 tablet (0.5 mg total) by mouth daily as needed (nausea). Patient taking differently: Take 0.5 mg by mouth daily as needed for anxiety (nausea). 02/06/20  Yes Bonnielee Haff, MD  midodrine (PROAMATINE) 10 MG tablet Take 10 mg by mouth 2 (two) times daily with a meal.   Yes [provider]  milrinone (PRIMACOR) 20 MG/100 ML SOLN infusion Inject 0.0149 mg/min into the vein continuous. Per [place] 08/18/20  Yes Clegg, [name] D, NP  ondansetron (ZOFRAN) 4 MG tablet Take 1 tablet (4 mg total) by mouth 2 (two) times daily as needed for nausea or vomiting. 08/18/20 08/18/21 Yes Larey Dresser, MD  pantoprazole (PROTONIX) 40 MG tablet Take 1 tablet (40 mg total) by mouth daily. 08/18/20  Yes Clegg, [name] D, NP  potassium chloride SA (KLOR-CON M20) 20 MEQ tablet Take 1 tablet (20 mEq total) by mouth 2 (two) times daily. 10/22/20 01/20/21 Yes Larey Dresser, MD  Rivaroxaban (XARELTO) 15 MG TABS tablet Take 1 tablet (15 mg total) by mouth daily with supper. 10/18/20  Yes Milford, Maricela Bo, FNP  sennosides-docusate sodium (SENOKOT-S) 8.6-50 MG tablet Take 1 tablet by mouth daily as needed for constipation.   Yes [provider]  zolpidem (AMBIEN) 10 MG tablet Take 10 mg by mouth at bedtime. 10/14/20  Yes [provider]  ciprofloxacin (CIPRO) 250 MG tablet Take 1 tablet (250 mg total) by mouth 2 (two) times daily. 10/21/20   Larey Dresser, MD  magnesium oxide (MAG-OX) 400 MG tablet Take 200 mg by mouth 2 (two) times daily. 10/20/20   [provider]  Magnesium Oxide 200 MG TABS Take 1 tablet (200 mg total) by mouth 2 (two) times daily. Patient not taking: No sig reported 10/18/20   Rafael Bihari, FNP  zolpidem (AMBIEN) 5 MG tablet Take 1 tablet (5 mg total) by mouth at bedtime. Patient not taking:  No sig reported 02/06/20   Bonnielee Haff, MD    Past Medical History: Past Medical History:  Diagnosis Date  . Arthritis   . Chronic combined systolic (congestive) and diastolic (congestive) heart failure ([place])   . Mitral regurgitation   . Non-ischemic cardiomyopathy ([place])   . Persistent atrial fibrillation ([place])   . Ventricular tachycardia [facility])     Past Surgical History: Past Surgical History:  Procedure Laterality Date  . AV NODE ABLATION N/A 09/07/2017   Procedure: AV NODE ABLATION;  Surgeon: [name], MD;  Location: [place] CV LAB;  Service: Cardiovascular;  Laterality: N/A;  . BIV ICD INSERTION CRT-D N/A 09/07/2017   Procedure:  BIV ICD INSERTION CRT-D;  Surgeon: [name], MD;  Location: [place] CV LAB;  Service: Cardiovascular;  Laterality: N/A;  . BUNIONECTOMY    . CARDIAC CATHETERIZATION     in 2004 at [facility]. "Insignificant blockage" per patient  . CARDIAC CATHETERIZATION N/A 04/25/2016   Procedure: Left Heart Cath and Coronary Angiography;  Surgeon: Nelva Bush, MD;  Location: [place] CV LAB;  Service: Cardiovascular;  Laterality: N/A;  . CARDIOVERSION N/A 06/29/2017   Procedure: CARDIOVERSION;  Surgeon: Larey Dresser, MD;  Location: [facility] ENDOSCOPY;  Service: Cardiovascular;  Laterality: N/A;  . CARDIOVERSION N/A 08/28/2017   Procedure: CARDIOVERSION;  Surgeon: Larey Dresser, MD;  Location: [place];  Service: Cardiovascular;  Laterality: N/A;  . IR ANGIOGRAM PULMONARY RIGHT SELECTIVE  08/04/2020  . IR ANGIOGRAM SELECTIVE EACH ADDITIONAL VESSEL  08/04/2020  . IR CT SPINE LTD  08/04/2020  . IR EMBO ART  VEN HEMORR LYMPH EXTRAV  INC GUIDE ROADMAPPING  08/04/2020  . IR FLUORO GUIDE CV LINE RIGHT  08/11/2020  . IR US GUIDE VASC ACCESS RIGHT  08/11/2020  . RADIOLOGY WITH ANESTHESIA N/A 08/04/2020   Procedure: IR WITH ANESTHESIA;  Surgeon: Arne Cleveland, MD;  Location: [place];  Service: Radiology;  Laterality: N/A;  . RIGHT HEART CATH N/A  09/29/2019   Procedure: RIGHT HEART CATH;  Surgeon: Larey Dresser, MD;  Location: [place] CV LAB;  Service: Cardiovascular;  Laterality: N/A;  . RIGHT HEART CATH N/A 08/04/2020   Procedure: RIGHT HEART CATH;  Surgeon: Larey Dresser, MD;  Location: [place] CV LAB;  Service: Cardiovascular;  Laterality: N/A;  . SHOULDER SURGERY     closed reduction  . TEE WITHOUT CARDIOVERSION N/A 06/29/2017   Procedure: TRANSESOPHAGEAL ECHOCARDIOGRAM (TEE);  Surgeon: Larey Dresser, MD;  Location: [facility] ENDOSCOPY;  Service: Cardiovascular;  Laterality: N/A;  . TONSILLECTOMY    . TOTAL HIP ARTHROPLASTY Left 03/18/2013   Procedure: TOTAL HIP ARTHROPLASTY ANTERIOR APPROACH;  Surgeon: Mauri Pole, MD;  Location: [facility];  Service: Orthopedics;  Laterality: Left;  . TUBAL LIGATION      Family History: Family History  Problem Relation Age of Onset  . Lung cancer Mother   . Heart attack Father     Social History: Social History   Socioeconomic History  . Marital status: Widowed    Spouse name: Not on file  . Number of children: Not on file  . Years of education: Not on file  . Highest education level: Not on file  Occupational History  . Not on file  Tobacco Use  . Smoking status: Never Smoker  . Smokeless tobacco: Never Used  Vaping Use  . Vaping Use: Never used  Substance and Sexual Activity  . Alcohol use: Yes    Alcohol/week: 3.0 standard drinks    Types: 3 Glasses of wine per week    Comment: per week  . Drug use: No  . Sexual activity: Not on file  Other Topics Concern  . Not on file  Social History Narrative  . Not on file   Social Determinants of Health   Financial Resource Strain: Not on file  Food Insecurity: Not on file  Transportation Needs: Not on file  Physical Activity: Not on file  Stress: Not on file  Social Connections: Not on file    Allergies:  Allergies  Allergen Reactions  . Sotalol Other (See Comments)    Prolonged QTc  . Alendronate Sodium  Other (See Comments)  Arm pain  . Compazine [Prochlorperazine] Other (See Comments)    Nervous reaction  . Dofetilide Nausea Only    Reported by [facility] physicians  . Lisinopril Other (See Comments)    Low bp  . Zolpidem Tartrate Er Other (See Comments)    Cannot take extended release - makes her too tired the next day  . Ciprofloxacin Other (See Comments)    Not effective  . Coreg [Carvedilol] Other (See Comments)    Fatigue   . Metoprolol Other (See Comments)    Profound fatigue    Objective:    Vital Signs:   Temp:  [98.6 F (37 C)] 98.6 F (37 C) (02/14 0601) Pulse Rate:  [79-82] 82 (02/14 0601) Resp:  [16-18] 16 (02/14 0601) BP: (108-124)/(64-89) 114/89 (02/14 0601) SpO2:  [95 %] 95 % (02/13 1040) Last BM Date: 10/22/20  Weight change: Filed Weights   10/22/20 1306 10/23/20 1052  Weight: 52.6 kg 56.4 kg    Intake/Output:   Intake/Output Summary (Last 24 hours) at 10/25/2020 0900 Last data filed at 10/25/2020 0325 Gross per 24 hour  Intake 1994.28 ml  Output --  Net 1994.28 ml      Physical Exam    General: Appears tired.  No resp difficulty HEENT: normal Neck: supple. JVP 10-11. Carotids 2+ bilat; no bruits. No lymphadenopathy or thyromegaly appreciated. Cor: PMI nondisplaced. Regular rate & rhythm. No rubs, gallops or murmurs. R upper chest tunneled picc  Lungs: clear on room air.  Abdomen: soft, nontender, nondistended. No hepatosplenomegaly. No bruits or masses. Good bowel sounds. Extremities: no cyanosis, clubbing, rash, edema Neuro: alert & orientedx3, cranial nerves grossly intact. moves all 4 extremities w/o difficulty. Affect flat    Telemetry  V paced 80s   EKG  V paced 82 bpm   Labs   Basic Metabolic Panel: Recent Labs  Lab 10/22/20 1227 10/22/20 1513 10/23/20 0317 10/24/20 0259 10/25/20 0632  NA 135  --  134* 135 133*  K 4.1  --  3.8 4.8 5.3*  CL 100  --  101 104 106  CO2 20*  --  22 20* 19*  GLUCOSE 116*  --  111* 118*  115*  BUN 50*  --  48* 45* 42*  CREATININE 2.29*  --  1.95* 1.70* 1.55*  CALCIUM 9.1  --  8.8* 8.7* 8.4*  MG  --  1.5* 2.4  --   --     Liver Function Tests: Recent Labs  Lab 10/22/20 1227  AST 47*  ALT 39  ALKPHOS 66  BILITOT 1.5*  PROT 7.5  ALBUMIN 3.5   No results for input(s): LIPASE, AMYLASE in the last 168 hours. No results for input(s): AMMONIA in the last 168 hours.  CBC: Recent Labs  Lab 10/22/20 1227 10/23/20 0317  WBC 6.0 5.8  NEUTROABS 4.4  --   HGB 10.8* 10.6*  HCT 33.9* 32.4*  MCV 83.1 83.1  PLT 224 216    Cardiac Enzymes: No results for input(s): CKTOTAL, CKMB, CKMBINDEX, TROPONINI in the last 168 hours.  BNP: BNP (last 3 results) Recent Labs    08/02/20 0832 10/22/20 1339  BNP 1,285.0* 1,503.8*    ProBNP (last 3 results) No results for input(s): PROBNP in the last 8760 hours.   CBG: No results for input(s): GLUCAP in the last 168 hours.  Coagulation Studies: Recent Labs    10/22/20 1227  LABPROT 29.6*  INR 2.9*     Imaging    No results found.  Medications:     Current Medications: . amiodarone  200 mg Oral Daily  . calcium carbonate  1 tablet Oral TID WC  . Chlorhexidine Gluconate Cloth  6 each Topical Daily  . magnesium oxide  200 mg Oral BID  . midodrine  10 mg Oral BID WC  . pantoprazole  40 mg Oral Daily  . Rivaroxaban  15 mg Oral Q supper  . rOPINIRole  0.5 mg Oral Q2000  . sodium zirconium cyclosilicate  5 g Oral Once     Infusions: . sodium chloride 50 mL/hr at 10/25/20 0325  . milrinone 0.25 mcg/kg/min (10/25/20 0601)       Assessment/Plan  1. End Stage Heart Failure -On Milrinone 0.25 mcg via tunneled PICC - Check CVP/CO-OX - BNP on admit >1500.  - Off diuretics since 2/11. Stop IV fluids. Creatinine trending down.   - Check CVP and then will likely need IV diuretics. It is possible abdominal discomfort /worsening renal function are all related to low output heart failure.  - no bb with low  output - Continue midodrine 10 mg twice a day - GDMT limited by hypotension  2. AKI  -Creatinine on admit 2.3. Off diuretics and receiving IV fluids. Creatinine down to 1.5 today .  -No NSAIDs  3. Hyperkalemia -Stop K supplements  4. PVCs Continue amio 200 mg daily Check TSH  5.PAF S/P AV nodal ablation/BiV pacing  6. Mitral Regurgitation Severe on Echo 12/21  7. DNR    Length of Stay: 3  [name] Clegg, NP  10/25/2020, 9:00 AM  Advanced Heart Failure Team Pager [phone] (M-F; 7a - 4p)  Please contact [facility] for night-coverage after hours (4p -7a ) and weekends on [email or web address]  Patient seen with NP, agree with the above note.   Was admitted with falls, fatigue, general FTT.  Creatinine was up to 2.3 at admission.  She had fallen multiple times at home.  Initially thought to be dehydrated (Lasix had been increased for a few days from 40 mg qam/20 qpm to 40 mg bid based on Optivol).  She had gentle IV fluid, creatinine down to 1.55.  She feels "bad" today, nauseated. UA was not suggestive of UTI.   General: NAD, frail Neck: JVP difficult but appears elevated, no thyromegaly or thyroid nodule.  Lungs: Clear to auscultation bilaterally with normal respiratory effort. CV: Nondisplaced PMI.  Heart regular S1/S2, no S3/S4, 2/6 HSM LLSB.  No peripheral edema.  No carotid bruit.  Normal pedal pulses.  Abdomen: Soft, nontender, no hepatosplenomegaly, no distention.  Skin: Intact without lesions or rashes.  Neurologic: Alert and oriented x 3.  Psych: Normal affect. Extremities: No clubbing or cyanosis.  HEENT: Normal.   1. Acute on chronic systolic HF: NICM, LHC 11/3352 showed no significant coronary disease. Cardiac MRI 10/17 EF 36%, diffuse HK, normal RV, biatrial enlargement, moderate MR, non-coronary LGE pattern involving the mid-wall of the basal to mid septum and inferior wall. Possibly prior myocarditis versus a form of infiltrative disease. Medtronic CRT-D s/p AV nodal  ablation.  Echo in 1/21 showed EF < 20% with moderate LV dilation and mildly decreased RV systolic function. Echo in 12/21 with EF 20-25%, severe MR, severe TR, moderate AI.  Low output on 1/21 RHC (CI 1.72) and 12/21 RHC (CI 1.8).  NYHA class III symptoms.  End stage HF now with home milrinone.  Admitted with failure to thrive, fatigue, AKI, persistent nausea.  I am concerned that these symptoms may signify worsening of her  HF.  No evidence for UTI or other infection. Exam somewhat difficult for volume but I suspect she is volume overloaded.  Creatinine is down to 1.55. - Check co-ox, may benefit from increased milrinone.  - Lasix on hold, but will start following CVP and if high, may need IV diuresis.  - She is off losartan, bisoprolol and spironolactone with symptomatic hypotension and now is on midodrine 10 mg bid.  - Off digoxin with elevated level.  - Low output on 1/21 and 12/21 RHCs.  Given her age and frailty, poor candidate for LVAD. End stage CHF now with marked fatigue, some improvement initially on milrinone.  I now worry about progressive heart failure.  She is followed by palliative care and is DNR, reasonable to move onto full hospice ([facility] to allow continued milrinone) when she is ready.  Palliative care service following here.   2. Atrial fibrillation: Chronic, s/p AV nodal ablation and BiV pacing.  - Continue Xarelto 15 mg daily.  3. PVCs/VT: Frequent PVCs have limited BiV pacing.  Not a good PVC ablation candidate per EP. Had VT in 1/21.  Cannot tolerate sotalol or Tikosyn. Did not tolerate mexilitine with nausea. Had nausea also with ranolazine. She had nausea in the past with amiodarone but seems to be tolerating it now. - Continue amiodarone 200 mg daily for PVC suppression to try to promote BiV pacing.   4. Mitral regurgitation: Severe on 12/21 echo, not candidate for intervention.  5. Hyperkalemia: Mild, stop K supplement.   [name] 10/25/2020 10:26 AM

## 2020-10-25 NOTE — Progress Notes (Signed)
On Milrinone 0.25  CO-OX 46%  CVP 12  Increase milrinone to 0.375 mcg and start 60 mg IV lasix twice a day.   Reassess in am.   Keoki Mchargue NP-C  2:24 PM

## 2020-10-25 NOTE — Evaluation (Signed)
Occupational Therapy Evaluation Patient Details Name: Cheryl Vargas MRN: [id number] DOB: [birth year] Today's Date: 10/25/2020    History of Present Illness Pt is a 86 y.o. female with medical history of nonischemic cardiomyopathy, chronic systolic congestive heart failure with ejection fraction of 20 to 25%, proximal A. fib, hyperlipidemia, hypertension, severe mitral valve regurgitation, CKD stage IIIb, L TKA who presents to the emergency department with complaints of generalized weakness, bilateral lower extremity swelling.  Patient states that she has been more fatigued than usual and also fell few times at home.  She follows with heart failure clinic.  She is on milrinone drip at home. Pt admitted with acute on chronic CHF and AKI. Noted pt with outpt palliative services in the past and plans to restart at discharge.   Clinical Impression   Pt was ambulating with a rollator and able to perform self care as well as prepare a simple meal independently. She relies on her family and neighbors for IADL and errands. Pt endorses falls related to urinary urgency. She presents with generalized weakness, poor activity tolerance and impaired standing balance. She transferred with +2 min hand held assist and requires set up to mod assist for ADL.  Pt cold and reports sleeping poorly, so evaluation limited. Plan is for for home with palliative care services. Recommending more supervision at home due to repeated falls.     Follow Up Recommendations  Home health OT;Supervision/Assistance - 24 hour    Equipment Recommendations  None recommended by OT    Recommendations for Other Services       Precautions / Restrictions Precautions Precautions: Fall Precaution Comments: reports multiple falls      Mobility Bed Mobility Overal bed mobility: Needs Assistance Bed Mobility: Supine to Sit     Supine to sit: Min guard          Transfers Overall transfer level: Needs assistance Equipment  used: 2 person hand held assist Transfers: Sit to/from Stand Sit to Stand: Min assist;+2 physical assistance         General transfer comment: Min A of 2 for HHA support    Balance Overall balance assessment: Needs assistance Sitting-balance support: No upper extremity supported Sitting balance-Leahy Scale: Fair     Standing balance support: Bilateral upper extremity supported Standing balance-Leahy Scale: Poor Standing balance comment: requiring at least single UE support, preferably 2 hand support                           ADL either performed or assessed with clinical judgement   ADL Overall ADL's : Needs assistance/impaired Eating/Feeding: Independent;Sitting   Grooming: Set up;Sitting   Upper Body Bathing: Minimal assistance;Sitting   Lower Body Bathing: Sit to/from stand;Moderate assistance   Upper Body Dressing : Set up;Sitting   Lower Body Dressing: Moderate assistance;Sit to/from stand   Toilet Transfer: +2 for physical assistance;Minimal assistance;BSC;Stand-pivot (+ 2 hand held)   Toileting- Water quality scientist and Hygiene: Minimal assistance;Sit to/from stand         General ADL Comments: pt with poor activity tolerance, generalized weakness and feeling of being cold     Vision Baseline Vision/History: Wears glasses Patient Visual Report: No change from baseline       Perception     Praxis      Pertinent Vitals/Pain Pain Assessment: No/denies pain     Hand Dominance Right   Extremity/Trunk Assessment Upper Extremity Assessment Upper Extremity Assessment: Generalized weakness;Overall [facility] for tasks assessed  Lower Extremity Assessment Lower Extremity Assessment: Defer to PT evaluation   Cervical / Trunk Assessment Cervical / Trunk Assessment: Kyphotic   Communication Communication Communication: No difficulties   Cognition Arousal/Alertness: Awake/alert Behavior During Therapy: WFL for tasks assessed/performed Overall  Cognitive Status: Within Functional Limits for tasks assessed                                     General Comments  VSS on RA    Exercises     Shoulder Instructions      Home Living Family/patient expects to be discharged to:: Private residence Living Arrangements: Alone Available Help at Discharge: Family;Available PRN/intermittently Type of Home: Apartment Home Access: Level entry     Home Layout: One level     Bathroom Shower/Tub: Occupational psychologist: Handicapped height     Home Equipment: Shower seat;Hand held Tourist information centre manager - 4 wheels;Toilet riser;Grab bars - toilet;Walker - 2 wheels;Cane - single point;Wheelchair - manual          Prior Functioning/Environment Level of Independence: Needs assistance  Gait / Transfers Assistance Needed: walks with rollator ADL's / Homemaking Assistance Needed: independent in self care, assisted for IADL            OT Problem List: Decreased strength;Decreased activity tolerance;Impaired balance (sitting and/or standing);Decreased knowledge of use of DME or AE;Cardiopulmonary status limiting activity      OT Treatment/Interventions: Self-care/ADL training;DME and/or AE instruction;Patient/family education;Balance training;Therapeutic activities;Energy conservation    OT Goals(Current goals can be found in the care plan section) Acute Rehab OT Goals Patient Stated Goal: return home; get warm and feel better OT Goal Formulation: With patient Time For Goal Achievement: 11/08/20 Potential to Achieve Goals: Fair ADL Goals Pt Will Perform Grooming: with supervision;standing Pt Will Perform Upper Body Bathing: with set-up;sitting Pt Will Perform Lower Body Bathing: with min assist;sit to/from stand Pt Will Perform Upper Body Dressing: with set-up;sitting Pt Will Perform Lower Body Dressing: with supervision;sit to/from stand Pt Will Transfer to Toilet: with supervision;ambulating;bedside  commode Pt Will Perform Toileting - Clothing Manipulation and hygiene: with supervision;sit to/from stand Additional ADL Goal #1: Pt will implement energy conservation strategies in ADL.  OT Frequency: Min 2X/week   Barriers to D/C:            Co-evaluation   Reason for Co-Treatment: For patient/therapist safety (hx of recent falls) PT goals addressed during session: Mobility/safety with mobility        AM-PAC OT "6 Clicks" Daily Activity     Outcome Measure Help from another person eating meals?: None Help from another person taking care of personal grooming?: A Little Help from another person toileting, which includes using toliet, bedpan, or urinal?: A Little Help from another person bathing (including washing, rinsing, drying)?: A Lot Help from another person to put on and taking off regular upper body clothing?: A Little Help from another person to put on and taking off regular lower body clothing?: A Lot 6 Click Score: 17   End of Session Nurse Communication: Mobility status  Activity Tolerance: Patient limited by fatigue Patient left: in chair;with call bell/phone within reach;with nursing/sitter in room  OT Visit Diagnosis: Unsteadiness on feet (R26.81);Other abnormalities of gait and mobility (R26.89);Muscle weakness (generalized) (M62.81);History of falling (Z91.81)                Time: 6433-2951 OT Time Calculation (min): 17 min Charges:  OT General Charges $OT Visit: 1 Visit OT Evaluation $OT Eval Moderate Complexity: 1 Mod  Nestor Lewandowsky, OTR/L Acute Rehabilitation Services Pager: [phone] Office: [phone]  Malka So 10/25/2020, 1:23 PM

## 2020-10-25 NOTE — Progress Notes (Signed)
PROGRESS NOTE    Brizza Nathanson Bound  YQM:[id number] DOB: 01-Apr-1935 DOA: 10/22/2020 PCP: Lavone Orn, MD   Chief Complain: Weakness  Brief Narrative: Cheryl Vargas is a 86 y.o. female with medical history of nonischemic cardiomyopathy, chronic systolic congestive heart failure with ejection fraction of 20 to 25%, proximal A. fib, hyperlipidemia, hypertension, severe mitral valve regurgitation, CKD stage IIIb, Status post total left knee replacement who presents to the emergency department with complaints of generalized weakness, bilateral lower extremity swelling.  Patient states that she has been more fatigued than usual and also fell few times at home.  She follows with heart failure clinic.  She is on milrinone drip at home.  She follows with home health.  Patient also reported that she had a weight gain of 2 pounds in the last week. On presentation, she was also found to have AKI on CKD. Patient was not eating or drinking well since last few days at home,so started on gentle IV fluids now stopped. CHF team   following.  Assessment & Plan:   Principal Problem:   Acute exacerbation of CHF (congestive heart failure) (HCC) Active Problems:   Paroxysmal atrial fibrillation (HCC)   Non-ischemic cardiomyopathy (HCC)    Acute on chronic systolic congestive heart failure: Presented with bilateral lower extremity swelling, weight gain.  Takes Lasix and palliative milrinone at home.  Follows with home health therapy.  Poor candidate for LVAD Continue monitoring input/output, daily weight Started on  home milrinone dose. Last echocardiogram on 07/2020 had shown ejection fraction of 20 to 25%. Cardiology following  AKI on CKD stage IIIb: Her last creatinine was around 1.21.  Baseline creatinine around 1.2.  Presented with creatinine in the range of 2. Apparently we knew that she was taking more Lasix than usual and was having poor oral intake at home. Started on gentle IV fluids, with improvement  in the kidney function.  Fluids stopped now..  Avoid nephrotoxins.  Monitor renal function Patient is mildly hyperkalemic today.  We will give her a dose of Lokelma.  Check BMP tomorrow.  Troponin elevation: Denies any chest pain.  This is from supply demand ischemia from severe congestive heart failure.  Nonischemic cardiomyopathy: She underwent cardiac cath on 04/2016 which showed normal coronaries.  Paroxysmal A. fib: On Xarelto, amiodarone.  Status post AV node ablation and biventricular pacing.  Has history of frequent PVCs/VT.  History of severe MR/severe TR/moderate AI: Follows with cardiology.  Not a candidate for intervention.  Suspicion for UTI: She was prescribed ciprofloxacin recently which she was not taking.    Denies urinary symptoms.  UA not suspicious for UTI.  No indication for treatment  Goals of care: Elderly patient with multiple comorbidities.  On home milrinone therapy for palliative purpose.  CODE STATUS is DNR.  Cardiology has previously recommended transition to hospice if her overall condition worsens.    Palliative care has been following.  The goal is to discharge her to home with milrinone therapy.  She should follow-up with outpatient palliative care  Debility/deconditioning/falls: Complains of generalized weakness, weight gain.  She is status post total left hip replacement.  We requested for PT/OT evaluation. She also has some bruise on her left hip after recent fall.  X-ray of the hip did not show any fracture        DVT prophylaxis:Xarelto Code Status: Full Family Communication: Discussed with son on phone on 10/22/2020 Status is: Inpatient  Remains inpatient appropriate because:Inpatient level of care appropriate due to  severity of illness   Dispo: The patient is from: Home              Anticipated d/c is to: Home              Anticipated d/c date is: 2 days              Patient currently is not medically stable to d/c.   Difficult to place  patient No  Needs cardiology clearance before discharge   Consultants: Cardiology  Procedures:None  Antimicrobials:  Anti-infectives (From admission, onward)   None      Subjective: Patient seen and examined the bedside this morning.  Hemodynamically stable.  She complains of nausea today which is a new problem.  Denies any abdominal pain.  Looks weak and mostly bedbound.  Objective: Vitals:   10/24/20 0309 10/24/20 1040 10/24/20 1210 10/25/20 0601  BP: 125/68 108/64 124/72 114/89  Pulse:  79  82  Resp: 18 18  16   Temp: (!) 96.8 F (36 C)   98.6 F (37 C)  TempSrc: Axillary   Oral  SpO2:  95%    Weight:      Height:        Intake/Output Summary (Last 24 hours) at 10/25/2020 0828 Last data filed at 10/25/2020 0325 Gross per 24 hour  Intake 1994.28 ml  Output --  Net 1994.28 ml   Filed Weights   10/22/20 1306 10/23/20 1052  Weight: 52.6 kg 56.4 kg    Examination:  General exam: Weak, chronically ill looking, pleasant elderly female HEENT:PERRL,Oral mucosa moist, Ear/Nose normal on gross exam Respiratory system: Bilateral equal air entry, normal vesicular breath sounds, no wheezes or crackles  Cardiovascular system: S1 & S2 heard, RRR. No JVD, murmurs, rubs, gallops or clicks.  Central on the left chest, pacemaker Gastrointestinal system: Abdomen is nondistended, soft and nontender. No organomegaly or masses felt. Normal bowel sounds heard. Central nervous system: Alert and oriented. No focal neurological deficits. Extremities: No edema, no clubbing ,no cyanosis Skin: No rashes, lesions or ulcers,no icterus ,no pallor    Data Reviewed: I have personally reviewed following labs and imaging studies  CBC: Recent Labs  Lab 10/22/20 1227 10/23/20 0317  WBC 6.0 5.8  NEUTROABS 4.4  --   HGB 10.8* 10.6*  HCT 33.9* 32.4*  MCV 83.1 83.1  PLT 224 315   Basic Metabolic Panel: Recent Labs  Lab 10/22/20 1227 10/22/20 1513 10/23/20 0317 10/24/20 0259  10/25/20 0632  NA 135  --  134* 135 133*  K 4.1  --  3.8 4.8 5.3*  CL 100  --  101 104 106  CO2 20*  --  22 20* 19*  GLUCOSE 116*  --  111* 118* 115*  BUN 50*  --  48* 45* 42*  CREATININE 2.29*  --  1.95* 1.70* 1.55*  CALCIUM 9.1  --  8.8* 8.7* 8.4*  MG  --  1.5* 2.4  --   --    GFR: Estimated Creatinine Clearance: 24.5 mL/min (A) (by C-G formula based on SCr of 1.55 mg/dL (H)). Liver Function Tests: Recent Labs  Lab 10/22/20 1227  AST 47*  ALT 39  ALKPHOS 66  BILITOT 1.5*  PROT 7.5  ALBUMIN 3.5   No results for input(s): LIPASE, AMYLASE in the last 168 hours. No results for input(s): AMMONIA in the last 168 hours. Coagulation Profile: Recent Labs  Lab 10/22/20 1227  INR 2.9*   Cardiac Enzymes: No results for input(s): CKTOTAL, CKMB,  CKMBINDEX, TROPONINI in the last 168 hours. BNP (last 3 results) No results for input(s): PROBNP in the last 8760 hours. HbA1C: No results for input(s): HGBA1C in the last 72 hours. CBG: No results for input(s): GLUCAP in the last 168 hours. Lipid Profile: No results for input(s): CHOL, HDL, LDLCALC, TRIG, CHOLHDL, LDLDIRECT in the last 72 hours. Thyroid Function Tests: No results for input(s): TSH, T4TOTAL, FREET4, T3FREE, THYROIDAB in the last 72 hours. Anemia Panel: No results for input(s): VITAMINB12, FOLATE, FERRITIN, TIBC, IRON, RETICCTPCT in the last 72 hours. Sepsis Labs: Recent Labs  Lab 10/22/20 1331  LATICACIDVEN 1.6    Recent Results (from the past 240 hour(s))  Culture, blood (Routine x 2)     Status: None (Preliminary result)   Collection Time: 10/22/20 12:27 PM   Specimen: BLOOD  Result Value Ref Range Status   Specimen Description BLOOD RIGHT ANTECUBITAL  Final   Special Requests   Final    BOTTLES DRAWN AEROBIC AND ANAEROBIC Blood Culture adequate volume   Culture   Final    NO GROWTH 2 DAYS Performed at [facility], [address] [place], [place] [address]    Report Status PENDING  Incomplete   Resp Panel by RT-PCR (Flu A&B, Covid) Nasopharyngeal Swab     Status: None   Collection Time: 10/22/20  2:26 PM   Specimen: Nasopharyngeal Swab; Nasopharyngeal(NP) swabs in vial transport medium  Result Value Ref Range Status   SARS Coronavirus 2 by RT PCR NEGATIVE NEGATIVE Final    Comment: (NOTE) SARS-CoV-2 target nucleic acids are NOT DETECTED.  The SARS-CoV-2 RNA is generally detectable in upper respiratory specimens during the acute phase of infection. The lowest concentration of SARS-CoV-2 viral copies this assay can detect is 138 copies/mL. A negative result does not preclude SARS-Cov-2 infection and should not be used as the sole basis for treatment or other patient management decisions. A negative result may occur with  improper specimen collection/handling, submission of specimen other than nasopharyngeal swab, presence of viral mutation(s) within the areas targeted by this assay, and inadequate number of viral copies(<138 copies/mL). A negative result must be combined with clinical observations, patient history, and epidemiological information. The expected result is Negative.  Fact Sheet for Patients:  EntrepreneurPulse.com.au  Fact Sheet for Healthcare Providers:  IncredibleEmployment.be  This test is no t yet approved or cleared by the [place] FDA and  has been authorized for detection and/or diagnosis of SARS-CoV-2 by FDA under an Emergency Use Authorization (EUA). This EUA will remain  in effect (meaning this test can be used) for the duration of the COVID-19 declaration under Section 564(b)(1) of the Act, 21 U.S.C.section 360bbb-3(b)(1), unless the authorization is terminated  or revoked sooner.       Influenza A by PCR NEGATIVE NEGATIVE Final   Influenza B by PCR NEGATIVE NEGATIVE Final    Comment: (NOTE) The Xpert Xpress SARS-CoV-2/FLU/RSV plus assay is intended as an aid in the diagnosis of influenza from  Nasopharyngeal swab specimens and should not be used as a sole basis for treatment. Nasal washings and aspirates are unacceptable for Xpert Xpress SARS-CoV-2/FLU/RSV testing.  Fact Sheet for Patients: EntrepreneurPulse.com.au  Fact Sheet for Healthcare Providers: IncredibleEmployment.be  This test is not yet approved or cleared by the [place] FDA and has been authorized for detection and/or diagnosis of SARS-CoV-2 by FDA under an Emergency Use Authorization (EUA). This EUA will remain in effect (meaning this test can be used) for the duration of  the COVID-19 declaration under Section 564(b)(1) of the Act, 21 U.S.C. section 360bbb-3(b)(1), unless the authorization is terminated or revoked.  Performed at [facility], [place] [address] [place], [place] [address]          Radiology Studies: DG Pelvis 1-2 Views  Result Date: 10/23/2020 CLINICAL DATA:  Multiple recent falls. EXAM: PELVIS - 1-2 VIEW COMPARISON:  CT abdomen pelvis from yesterday. FINDINGS: There is no evidence of pelvic fracture or diastasis. Prior left total hip arthroplasty. Chronic slight periprosthetic lucency measuring less than 2 mm, within normal limits. Calcified granulomas in both buttocks. IMPRESSION: 1. No acute osseous abnormality. Electronically Signed   By: Titus Dubin M.D.   On: 10/23/2020 09:45        Scheduled Meds: . amiodarone  200 mg Oral Daily  . calcium carbonate  1 tablet Oral TID WC  . Chlorhexidine Gluconate Cloth  6 each Topical Daily  . magnesium oxide  200 mg Oral BID  . midodrine  10 mg Oral BID WC  . pantoprazole  40 mg Oral Daily  . potassium chloride SA  20 mEq Oral BID  . Rivaroxaban  15 mg Oral Q supper  . rOPINIRole  0.5 mg Oral Q2000  . sodium zirconium cyclosilicate  5 g Oral Once   Continuous Infusions: . sodium chloride 50 mL/hr at 10/25/20 0325  . milrinone 0.25 mcg/kg/min (10/25/20 0601)     LOS: 3 days    Time  spent:25 mins. More than 50% of that time was spent in counseling and/or coordination of care.      Shelly Coss, MD [facility] P2/14/2022, 8:28 AM

## 2020-10-26 DIAGNOSIS — I5023 Acute on chronic systolic (congestive) heart failure: Secondary | ICD-10-CM | POA: Diagnosis not present

## 2020-10-26 DIAGNOSIS — I5043 Acute on chronic combined systolic (congestive) and diastolic (congestive) heart failure: Secondary | ICD-10-CM | POA: Diagnosis not present

## 2020-10-26 DIAGNOSIS — I428 Other cardiomyopathies: Secondary | ICD-10-CM | POA: Diagnosis not present

## 2020-10-26 DIAGNOSIS — I480 Paroxysmal atrial fibrillation: Secondary | ICD-10-CM | POA: Diagnosis not present

## 2020-10-26 LAB — COOXEMETRY PANEL
Carboxyhemoglobin: 1.3 % (ref 0.5–1.5)
Carboxyhemoglobin: 1.3 % (ref 0.5–1.5)
Methemoglobin: 0.9 % (ref 0.0–1.5)
Methemoglobin: 1 % (ref 0.0–1.5)
O2 Saturation: 50.6 %
O2 Saturation: 47.2 %
Total hemoglobin: 9.9 g/dL — ABNORMAL LOW (ref 12.0–16.0)
Total hemoglobin: 9.9 g/dL — ABNORMAL LOW (ref 12.0–16.0)

## 2020-10-26 LAB — BASIC METABOLIC PANEL
Anion gap: 10 (ref 5–15)
BUN: 40 mg/dL — ABNORMAL HIGH (ref 8–23)
CO2: 21 mmol/L — ABNORMAL LOW (ref 22–32)
Calcium: 8.2 mg/dL — ABNORMAL LOW (ref 8.9–10.3)
Chloride: 103 mmol/L (ref 98–111)
Creatinine, Ser: 1.67 mg/dL — ABNORMAL HIGH (ref 0.44–1.00)
GFR, Estimated: 30 mL/min — ABNORMAL LOW (ref >60)
Glucose, Bld: 117 mg/dL — ABNORMAL HIGH (ref 70–99)
Potassium: 3.7 mmol/L (ref 3.5–5.1)
Sodium: 134 mmol/L — ABNORMAL LOW (ref 135–145)

## 2020-10-26 LAB — TSH: TSH: 10.623 u[IU]/mL — ABNORMAL HIGH (ref 0.350–4.500)

## 2020-10-26 MED ORDER — SODIUM CHLORIDE 0.9% FLUSH: 10.0000 mL | INTRAVENOUS | Status: DC | PRN

## 2020-10-26 MED ORDER — POTASSIUM CHLORIDE CRYS ER 20 MEQ PO TBCR
40.0000 meq | EXTENDED_RELEASE_TABLET | Freq: Once | ORAL | Status: AC
  Administered 2020-10-26: 40 meq via ORAL
  Filled 2020-10-26: qty 2

## 2020-10-26 MED ORDER — SODIUM CHLORIDE 0.9% FLUSH
10.0000 mL | Freq: Two times a day (BID) | INTRAVENOUS | Status: DC
  Administered 2020-10-26 – 2020-10-27 (×3): 10 mL
  Administered 2020-10-27: 30 mL
  Administered 2020-10-28 – 2020-10-29 (×3): 10 mL

## 2020-10-26 MED ORDER — FUROSEMIDE 10 MG/ML IJ SOLN
80.0000 mg | Freq: Two times a day (BID) | INTRAMUSCULAR | Status: DC
  Administered 2020-10-26 – 2020-10-29 (×7): 80 mg via INTRAVENOUS
  Filled 2020-10-26 (×6): qty 8

## 2020-10-26 NOTE — Progress Notes (Signed)
EPIC Encounter for ICM Monitoring  Patient Name: Cheryl Vargas is a 86 y.o. female Date: 10/26/2020 Primary Care Physican: Lavone Orn, MD Primary Cardiologist:[place] Electrophysiologist:Allred Bi-V Pacing:97.8% 2/2/2022Weight: 122lbs(114 lbs 2 weeks ago)   Currently hospitalized.  OptivolThoracic impedancesuggesting possible fluid accumulation starting 09/21/2020.  Prescribed:   Furosemide40 mg take1tablet (40 mg total)by mouthdaily.  Potassium 20 mEq take 1 tablet daily.  Labs: 10/26/2020 Creatinine 1.67, BUN 40, Potassium 3.7, Sodium 134, GFR 30 10/25/2020 Creatinine 1.55, BUN 42, Potassium 5.3, Sodium 133, GFR 33  10/24/2020 Creatinine 1.70, BUN 45, Potassium 4.8, Sodium 135, GFR 30  10/23/2020 Creatinine 1.95, BUN 48, Potassium 3.8, Sodium 134, GFR 25  10/22/2020 Creatinine 2.29, BUN 50, Potassium 4.1, Sodium 135, GFR 21  A complete set of results can be found in Results Review.  Recommendations: None  Follow-up plan: ICM clinic phone appointment on2/28/2021 to recheck fluid levels post hospitalization. 91 day device clinic remote transmission3/30/2022.   EP/Cardiology Office Visits: 2021/01/04 with Dr Aundra Dubin.10/29/2020 with Dr Rayann Heman  Copy of ICM check sent to Dr.Allred.  3 month ICM trend: 10/19/2020.    1 Year ICM trend:       Rosalene Billings, RN 10/26/2020 9:01 AM

## 2020-10-26 NOTE — TOC Initial Note (Signed)
Transition of Care [facility]) - Initial/Assessment Note    Patient Details  Name: Cheryl Vargas MRN: [id number] Date of Birth: 22-Jun-1935  Transition of Care [facility]) CM/SW Contact:    [name], RN Phone Number: 10/26/2020, 3:58 PM  Clinical Narrative: Patient presented from home on IV Milrinone- active with [facility]. Plan will be to return home with [facility] and additional services- RN, PT, OT, Aide. Start of care to begin within 24-48 hours post transition home. Patient will need home health orders and F2F for RN, PT, OT, & Aide. Patient is active with [facility] for outpatient palliative services. Case Manager will continue to follow for additional transition of care services.           Expected Discharge Plan: Center Barriers to Discharge: Continued Medical Work up   Patient Goals and CMS Choice Patient states their goals for this hospitalization and ongoing recovery are:: to return home with palliative services and home health   Choice offered to / list presented to : NA  Expected Discharge Plan and Services Expected Discharge Plan: [place] In-house Referral: Hospice / Palliative Care Discharge Planning Services: CM Consult Post Acute Care Choice: Home Health,Resumption of Svcs/PTA Provider Living arrangements for the past 2 months: Apartment (condo)                 DME Arranged: N/A         HH Arranged: RN,Disease Management,PT,OT,Nurse's Aide [place] Agency: [facility] Date [place]: 10/26/20 Time [place]: 1445 Representative spoke with at [place]: [name]  Prior Living Arrangements/Services Living arrangements for the past 2 months: Apartment (condo) Lives with:: Self (has support of son) Patient language and need for interpreter reviewed:: Yes Do you feel safe going back to the place where you live?: Yes      Need for Family Participation in  Patient Care: Yes (Comment) Care giver support system in place?: Yes (comment) Current home services: Home RN (IV Milrinone) Criminal Activity/Legal Involvement Pertinent to Current Situation/Hospitalization: No - Comment as needed  Activities of Daily Living Home Assistive Devices/Equipment: Environmental consultant (specify type) ADL Screening (condition at time of admission) Patient's cognitive ability adequate to safely complete daily activities?: Yes Is the patient deaf or have difficulty hearing?: No Does the patient have difficulty seeing, even when wearing glasses/contacts?: No Does the patient have difficulty concentrating, remembering, or making decisions?: Yes Patient able to express need for assistance with ADLs?: Yes Does the patient have difficulty dressing or bathing?: No Independently performs ADLs?: Yes (appropriate for developmental age) Does the patient have difficulty walking or climbing stairs?: Yes Weakness of Legs: Both Weakness of Arms/Hands: None  Permission Sought/Granted Permission sought to share information with : Case Manager,Facility Contact Representative,Family Supports Permission granted to share information with : Yes, Verbal Permission Granted     Permission granted to share info w AGENCY: [facility]        Emotional Assessment Appearance:: Appears stated age       Alcohol / Substance Use: Not Applicable Psych Involvement: No (comment)  Admission diagnosis:  Fall [W19.XXXA] Acute exacerbation of CHF (congestive heart failure) (HCC) [I50.9] Generalized weakness [R53.1] AKI (acute kidney injury) ([place]) [N17.9] Patient Active Problem List   Diagnosis Date Noted  . Acute exacerbation of CHF (congestive heart failure) ([place]) 10/22/2020  . Acute on chronic combined systolic (congestive) and diastolic (congestive) heart failure ([place])   .  Palliative care encounter   . Goals of care, counseling/discussion   . Encounter for hospice care discussion   .  Acute on chronic systolic (congestive) heart failure ([place]) 08/02/2020  . Malnutrition of moderate degree 02/03/2020  . DNR (do not resuscitate) discussion   . Palliative care by specialist   . UTI (urinary tract infection) 02/01/2020  . Prolonged QT interval 02/01/2020  . Chronic anticoagulation 02/01/2020  . Transient hypotension 02/01/2020  . History of shingles 02/01/2020  . Near syncope 05/05/2019  . Hypercholesterolemia without hypertriglyceridemia 02/13/2018  . Syncope 01/21/2018  . Constipation 01/21/2018  . CHF exacerbation ([place]) 12/18/2017  . CKD (chronic kidney disease), stage III ([place]) 12/16/2017  . SOB (shortness of breath)   . Abdominal pain 09/18/2017  . CHF (congestive heart failure) ([place]) 09/17/2017  . Pulmonary edema   . Cardiac device in situ   . Ventricular tachycardia ([place])   . Cardiac arrest ([place])   . Severe mitral valve regurgitation   . A-fib ([place]) 09/03/2017  . Mitral regurgitation   . Acute on chronic systolic heart failure ([place])   . Non-ischemic cardiomyopathy ([place])   . CHF (congestive heart failure), NYHA class IV ([place]) 04/24/2016  . Dizziness 12/01/2015  . Hypertension 12/01/2015  . Insomnia 12/01/2015  . Chronic combined systolic and diastolic CHF (congestive heart failure) ([place]) 12/01/2015  . Paroxysmal atrial fibrillation (HCC)   . Cardiac arrhythmia 10/20/2014  . Systolic dysfunction 58/83/2549  . Hypoventilation, idiopathic 10/20/2014  . Weakness 10/20/2014  . Closed left hip fracture ([place]) 03/17/2013  . Cardiomegaly 03/17/2013  . Frequent PVCs 03/17/2013  . Hyperlipidemia 03/17/2013   PCP:  Lavone Orn, MD Pharmacy:   [facility] [place], [place] [address] Phone: [phone] Fax: [phone]  [facility] [place], [place] - [address] [place] [address] Phone:  [phone] Fax: [phone]  [facility] - [place], [place] - [address] [place] [address] Phone: [phone] Fax: [phone]  Readmission Risk Interventions Readmission Risk Prevention Plan 10/26/2020  Transportation Screening Complete  PCP or Specialist Appt within 3-5 Days Complete  HRI or Home Care Consult Complete  Social Work Consult for [place] Planning/Counseling Complete  Palliative Care Screening Complete  Medication Review Press photographer) Complete  Some recent data might be hidden

## 2020-10-26 NOTE — Progress Notes (Signed)
PROGRESS NOTE    Cheryl Vargas  XKG:[id number] DOB: 1935-08-28 DOA: 10/22/2020 PCP: Lavone Orn, MD   Chief Complain: Weakness  Brief Narrative: Cheryl Vargas is a 86 y.o. female with medical history of nonischemic cardiomyopathy, chronic systolic congestive heart failure with ejection fraction of 20 to 25%, proximal A. fib, hyperlipidemia, hypertension, severe mitral valve regurgitation, CKD stage IIIb, Status post total left knee replacement who presents to the emergency department with complaints of generalized weakness, bilateral lower extremity swelling.  Patient states that she has been more fatigued than usual and also fell few times at home.  She follows with heart failure clinic.  She is on milrinone drip at home.  She follows with home health.  Patient also reported that she had a weight gain of 2 pounds in the last week. On presentation, she was also found to have AKI on CKD. Patient was not eating or drinking well since last few days at home,so started on gentle IV fluids now stopped. CHF team   following.  On milrinone infusion and IV Lasix  Assessment & Plan:   Principal Problem:   Acute exacerbation of CHF (congestive heart failure) (HCC) Active Problems:   Paroxysmal atrial fibrillation (HCC)   Non-ischemic cardiomyopathy (HCC)    Acute on chronic systolic congestive heart failure: Presented with bilateral lower extremity swelling, weight gain.  Takes Lasix and palliative milrinone at home.  Follows with home health therapy.  Poor candidate for LVAD Continue monitoring input/output, daily weight Started on  home milrinone dose. Last echocardiogram on 07/2020 had shown ejection fraction of 20 to 25%. Cardiology following Currently on Lasix 80 mg twice daily.  AKI on CKD stage IIIb: Her last creatinine was around 1.21.  Baseline creatinine around 1.2.  Presented with creatinine in the range of 2. Apparently we knew that she was taking more Lasix than usual and was  having poor oral intake at home. Started on gentle IV fluids on admission, with improvement in the kidney function.  Fluids stopped now.  Creatinine started trending up.  Suspected to be volume overloaded.  Started on Lasix IV 80 mg BID  troponin elevation: Denies any chest pain.  This is from supply demand ischemia from severe congestive heart failure.  Hypothyroidism: TSH of 10.6.  Checking  free T3 and T4  Nonischemic cardiomyopathy: She underwent cardiac cath on 04/2016 which showed normal coronaries.  Paroxysmal A. fib: On Xarelto, amiodarone.  Status post AV node ablation and biventricular pacing.  Has history of frequent PVCs/VT.  History of severe MR/severe TR/moderate AI: Follows with cardiology.  Not a candidate for intervention.  Suspicion for UTI: She was prescribed ciprofloxacin recently which she was not taking.    Denies urinary symptoms.  UA not suspicious for UTI.  No indication for treatment  Goals of care: Elderly patient with multiple comorbidities.  On home milrinone therapy for palliative purpose.  CODE STATUS is DNR.  Cardiology has previously recommended transition to hospice if her overall condition worsens.    Palliative care has been following.  The goal is to discharge her to home with milrinone therapy.  She should follow-up with outpatient palliative care  Debility/deconditioning/falls: Complains of generalized weakness, weight gain.  She is status post total left hip replacement.    PT/OT recommended home health on discharge.  She also had some bruise on her left hip after recent fall.  X-ray of the hip did not show any fracture        DVT prophylaxis:Xarelto  Code Status: Full Family Communication: Discussed with son on phone on 10/22/2020 Status is: Inpatient  Remains inpatient appropriate because:Inpatient level of care appropriate due to severity of illness   Dispo: The patient is from: Home              Anticipated d/c is to: Home               Anticipated d/c date is: 2 days              Patient currently is not medically stable to d/c.   Difficult to place patient No  Needs cardiology clearance before discharge   Consultants: Cardiology  Procedures:None  Antimicrobials:  Anti-infectives (From admission, onward)   None      Subjective: Patient seen and examined at the bedside this morning.  She was lying on the bed, sleeping.  She denies any nausea today.  She states she had a good sleep.  I have encouraged the patient to at least try sitting on the chair,ambulate if possible.  Objective: Vitals:   10/25/20 1841 10/25/20 1930 10/26/20 0443 10/26/20 0757  BP: 112/63 125/68 119/64 (!) 104/54  Pulse: 80 80 80 82  Resp: 16 16 16 15   Temp: 98.6 F (37 C) 98.6 F (37 C) 97.7 F (36.5 C) 97.6 F (36.4 C)  TempSrc: Oral Oral Oral Oral  SpO2: 99% 97%  100%  Weight:   58.3 kg   Height:   5\' 5"  (1.651 m)     Intake/Output Summary (Last 24 hours) at 10/26/2020 0816 Last data filed at 10/26/2020 0454 Gross per 24 hour  Intake 710 ml  Output 1500 ml  Net -790 ml   Filed Weights   10/23/20 1052 10/25/20 0948 10/26/20 0443  Weight: 56.4 kg 56.1 kg 58.3 kg    Examination:  General exam: Chronically ill looking, weak, deconditioned HEENT:PERRL,Oral mucosa moist, Ear/Nose normal on gross exam Respiratory system: Bilateral equal air entry, normal vesicular breath sounds, no wheezes or crackles  Cardiovascular system: S1 & S2 heard, RRR. No JVD, murmurs, rubs, gallops or clicks.  Central line on the right chest, pacemaker Gastrointestinal system: Abdomen is nondistended, soft and nontender. No organomegaly or masses felt. Normal bowel sounds heard. Central nervous system: Alert and oriented. No focal neurological deficits. Extremities: No edema, no clubbing ,no cyanosis Skin: No rashes, lesions or ulcers,no icterus ,no pallor   Data Reviewed: I have personally reviewed following labs and imaging  studies  CBC: Recent Labs  Lab 10/22/20 1227 10/23/20 0317  WBC 6.0 5.8  NEUTROABS 4.4  --   HGB 10.8* 10.6*  HCT 33.9* 32.4*  MCV 83.1 83.1  PLT 224 557   Basic Metabolic Panel: Recent Labs  Lab 10/22/20 1227 10/22/20 1513 10/23/20 0317 10/24/20 0259 10/25/20 0632 10/26/20 0430  NA 135  --  134* 135 133* 134*  K 4.1  --  3.8 4.8 5.3* 3.7  CL 100  --  101 104 106 103  CO2 20*  --  22 20* 19* 21*  GLUCOSE 116*  --  111* 118* 115* 117*  BUN 50*  --  48* 45* 42* 40*  CREATININE 2.29*  --  1.95* 1.70* 1.55* 1.67*  CALCIUM 9.1  --  8.8* 8.7* 8.4* 8.2*  MG  --  1.5* 2.4  --   --   --    GFR: Estimated Creatinine Clearance: 23 mL/min (A) (by C-G formula based on SCr of 1.67 mg/dL (H)). Liver Function Tests: Recent Labs  Lab 10/22/20 1227  AST 47*  ALT 39  ALKPHOS 66  BILITOT 1.5*  PROT 7.5  ALBUMIN 3.5   No results for input(s): LIPASE, AMYLASE in the last 168 hours. No results for input(s): AMMONIA in the last 168 hours. Coagulation Profile: Recent Labs  Lab 10/22/20 1227  INR 2.9*   Cardiac Enzymes: No results for input(s): CKTOTAL, CKMB, CKMBINDEX, TROPONINI in the last 168 hours. BNP (last 3 results) No results for input(s): PROBNP in the last 8760 hours. HbA1C: No results for input(s): HGBA1C in the last 72 hours. CBG: No results for input(s): GLUCAP in the last 168 hours. Lipid Profile: No results for input(s): CHOL, HDL, LDLCALC, TRIG, CHOLHDL, LDLDIRECT in the last 72 hours. Thyroid Function Tests: Recent Labs    10/26/20 0430  TSH 10.623*   Anemia Panel: No results for input(s): VITAMINB12, FOLATE, FERRITIN, TIBC, IRON, RETICCTPCT in the last 72 hours. Sepsis Labs: Recent Labs  Lab 10/22/20 1331  LATICACIDVEN 1.6    Recent Results (from the past 240 hour(s))  Culture, blood (Routine x 2)     Status: None (Preliminary result)   Collection Time: 10/22/20 12:27 PM   Specimen: BLOOD  Result Value Ref Range Status   Specimen Description  BLOOD RIGHT ANTECUBITAL  Final   Special Requests   Final    BOTTLES DRAWN AEROBIC AND ANAEROBIC Blood Culture adequate volume   Culture   Final    NO GROWTH 3 DAYS Performed at [facility], [address], [place], [place] [address]    Report Status PENDING  Incomplete  Resp Panel by RT-PCR (Flu A&B, Covid) Nasopharyngeal Swab     Status: None   Collection Time: 10/22/20  2:26 PM   Specimen: Nasopharyngeal Swab; Nasopharyngeal(NP) swabs in vial transport medium  Result Value Ref Range Status   SARS Coronavirus 2 by RT PCR NEGATIVE NEGATIVE Final    Comment: (NOTE) SARS-CoV-2 target nucleic acids are NOT DETECTED.  The SARS-CoV-2 RNA is generally detectable in upper respiratory specimens during the acute phase of infection. The lowest concentration of SARS-CoV-2 viral copies this assay can detect is 138 copies/mL. A negative result does not preclude SARS-Cov-2 infection and should not be used as the sole basis for treatment or other patient management decisions. A negative result may occur with  improper specimen collection/handling, submission of specimen other than nasopharyngeal swab, presence of viral mutation(s) within the areas targeted by this assay, and inadequate number of viral copies(<138 copies/mL). A negative result must be combined with clinical observations, patient history, and epidemiological information. The expected result is Negative.  Fact Sheet for Patients:  EntrepreneurPulse.com.au  Fact Sheet for Healthcare Providers:  IncredibleEmployment.be  This test is no t yet approved or cleared by the [place] FDA and  has been authorized for detection and/or diagnosis of SARS-CoV-2 by FDA under an Emergency Use Authorization (EUA). This EUA will remain  in effect (meaning this test can be used) for the duration of the COVID-19 declaration under Section 564(b)(1) of the Act, 21 U.S.C.section 360bbb-3(b)(1), unless  the authorization is terminated  or revoked sooner.       Influenza A by PCR NEGATIVE NEGATIVE Final   Influenza B by PCR NEGATIVE NEGATIVE Final    Comment: (NOTE) The Xpert Xpress SARS-CoV-2/FLU/RSV plus assay is intended as an aid in the diagnosis of influenza from Nasopharyngeal swab specimens and should not be used as a sole basis for treatment. Nasal washings and aspirates are unacceptable for Xpert Xpress  SARS-CoV-2/FLU/RSV testing.  Fact Sheet for Patients: EntrepreneurPulse.com.au  Fact Sheet for Healthcare Providers: IncredibleEmployment.be  This test is not yet approved or cleared by the [place] FDA and has been authorized for detection and/or diagnosis of SARS-CoV-2 by FDA under an Emergency Use Authorization (EUA). This EUA will remain in effect (meaning this test can be used) for the duration of the COVID-19 declaration under Section 564(b)(1) of the Act, 21 U.S.C. section 360bbb-3(b)(1), unless the authorization is terminated or revoked.  Performed at [facility], [place] [address] [place], [place] [address]          Radiology Studies: No results found.      Scheduled Meds: . amiodarone  200 mg Oral Daily  . calcium carbonate  1 tablet Oral TID WC  . Chlorhexidine Gluconate Cloth  6 each Topical Daily  . furosemide  80 mg Intravenous BID  . magnesium oxide  200 mg Oral BID  . midodrine  10 mg Oral BID WC  . pantoprazole  40 mg Oral Daily  . potassium chloride  40 mEq Oral Once  . Rivaroxaban  15 mg Oral Q supper  . rOPINIRole  0.5 mg Oral Q2000  . sodium chloride flush  10-40 mL Intracatheter Q12H   Continuous Infusions: . milrinone 0.375 mcg/kg/min (10/26/20 0327)     LOS: 4 days    Time spent:25 mins. More than 50% of that time was spent in counseling and/or coordination of care.      Shelly Coss, MD [facility] P2/15/2022, 8:16 AM

## 2020-10-26 NOTE — Progress Notes (Addendum)
Advanced Heart Failure Rounding Note  PCP-Cardiologist: [name], MD   Subjective:    Yesterday milrinone increased to 0.375 mcg and diuresed with IV lasix. I/O not accurate.   CO-OX 46>51%   Feeling better today. No abdominal pain. Denies shortness of breath.   Objective:   Weight Range: 58.3 kg Body mass index is 21.4 kg/m.   Vital Signs:   Temp:  [97.4 F (36.3 C)-98.6 F (37 C)] 97.6 F (36.4 C) (02/15 0757) Pulse Rate:  [80-82] 82 (02/15 0757) Resp:  [15-18] 15 (02/15 0757) BP: (104-125)/(54-74) 104/54 (02/15 0757) SpO2:  [97 %-100 %] 100 % (02/15 0757) Weight:  [56.1 kg-58.3 kg] 58.3 kg (02/15 0443) Last BM Date: 10/23/20  Weight change: Filed Weights   10/23/20 1052 10/25/20 0948 10/26/20 0443  Weight: 56.4 kg 56.1 kg 58.3 kg    Intake/Output:   Intake/Output Summary (Last 24 hours) at 10/26/2020 0802 Last data filed at 10/26/2020 0454 Gross per 24 hour  Intake 710 ml  Output 1500 ml  Net -790 ml      Physical Exam   CVP 16-17  General:   No resp difficulty HEENT: Normal Neck: Supple. JVP to jaw  . Carotids 2+ bilat; no bruits. No lymphadenopathy or thyromegaly appreciated. Cor: PMI nondisplaced. Regular rate & rhythm. No rubs, gallops or murmurs. R upper chest tunneled catheter.  Lungs: Clear Abdomen: Soft, nontender, nondistended. No hepatosplenomegaly. No bruits or masses. Good bowel sounds. Extremities: No cyanosis, clubbing, rash, edema Neuro: Alert & orientedx3, cranial nerves grossly intact. moves all 4 extremities w/o difficulty. Affect pleasant   Telemetry  Paced 80s   EKG   none  Labs    CBC No results for input(s): WBC, NEUTROABS, HGB, HCT, MCV, PLT in the last 72 hours. Basic Metabolic Panel Recent Labs    10/25/20 0632 10/26/20 0430  NA 133* 134*  K 5.3* 3.7  CL 106 103  CO2 19* 21*  GLUCOSE 115* 117*  BUN 42* 40*  CREATININE 1.55* 1.67*  CALCIUM 8.4* 8.2*   Liver Function Tests No results for input(s):  AST, ALT, ALKPHOS, BILITOT, PROT, ALBUMIN in the last 72 hours. No results for input(s): LIPASE, AMYLASE in the last 72 hours. Cardiac Enzymes No results for input(s): CKTOTAL, CKMB, CKMBINDEX, TROPONINI in the last 72 hours.  BNP: BNP (last 3 results) Recent Labs    08/02/20 0832 10/22/20 1339  BNP 1,285.0* 1,503.8*    ProBNP (last 3 results) No results for input(s): PROBNP in the last 8760 hours.   D-Dimer No results for input(s): DDIMER in the last 72 hours. Hemoglobin A1C No results for input(s): HGBA1C in the last 72 hours. Fasting Lipid Panel No results for input(s): CHOL, HDL, LDLCALC, TRIG, CHOLHDL, LDLDIRECT in the last 72 hours. Thyroid Function Tests Recent Labs    10/26/20 0430  TSH 10.623*    Other results:   Imaging     No results found.   Medications:     Scheduled Medications: . amiodarone  200 mg Oral Daily  . calcium carbonate  1 tablet Oral TID WC  . Chlorhexidine Gluconate Cloth  6 each Topical Daily  . furosemide  60 mg Intravenous BID  . magnesium oxide  200 mg Oral BID  . midodrine  10 mg Oral BID WC  . pantoprazole  40 mg Oral Daily  . Rivaroxaban  15 mg Oral Q supper  . rOPINIRole  0.5 mg Oral Q2000  . sodium chloride flush  10-40 mL Intracatheter  Q12H     Infusions: . milrinone 0.375 mcg/kg/min (10/26/20 0327)     PRN Medications:  acetaminophen, LORazepam, ondansetron (ZOFRAN) IV, oxyCODONE, promethazine, senna-docusate, sodium chloride flush, zolpidem     Assessment/Plan   1. End Stage Heart Failure -On Milrinone 0.25 mcg via tunneled PICC - BNP on admit >1500.  - Off diuretics since 2/11. IV fluids stopped 2/14 - CVP 16-17. Increase lasix to 80 mg twice a day. GIve 40 meq K  - Milrinone increased to 0.375 mcg. CO-OX 45>51%ding down.   -  no bb with low output - Continue midodrine 10 mg twice a day - GDMT limited by hypotension  2. AKI  -Creatinine on admit 2.3. Off diuretics and receiving IV fluids.  Creatinine 1.7 today .  -No NSAIDs  3. Hyperkalemia -Resolved.   4. PVCs Continue amio 200 mg daily TSH 11.   5.PAF S/P AV nodal ablation/BiV pacing On xarelto  6. Mitral Regurgitation Severe on Echo 12/21  7. DNR     Length of Stay: 4  [name] Clegg, NP  10/26/2020, 8:02 AM  Advanced Heart Failure Team Pager [phone] (M-F; 7a - 4p)  Please contact [facility] for night-coverage after hours (4p -7a ) and weekends on [email or web address]  Patient seen with NP, agree with the above note.   Feeling much better today, slept well and less nauseated.  With low co-ox, milrinone increased to 0.375.  Early am co-ox higher but still not great at 51%.  CVP 16 this morning, now on IV Lasix.  Creatinine mildly higher 1.55 => 1.67.   General: NAD Neck: JVP 14 cm, no thyromegaly or thyroid nodule.  Lungs: Clear to auscultation bilaterally with normal respiratory effort. CV: Lateral PMI.  Heart regular S1/S2, no S3/S4, 2/6 HSM apex.  No peripheral edema.   Abdomen: Soft, nontender, no hepatosplenomegaly, no distention.  Skin: Intact without lesions or rashes.  Neurologic: Alert and oriented x 3.  Psych: Normal affect. Extremities: No clubbing or cyanosis.  HEENT: Normal.   Suspect nausea/vomiting may have been due to low output/volume overload.  - Continue higher dose of milrinone 0.375 and will repeat co-ox now that she is awake.  - Increase Lasix to 80 mg IV bid and replace K.  - BP stable on current midodrine.   Continue Xarelto for chronic afib.   TSH elevated, send free T3 and free T4.   [name] 10/26/2020 8:28 AM

## 2020-10-26 NOTE — Progress Notes (Signed)
Palliative Medicine RN Note: Our office rec'd a call from pt's son [name] [phone]). They want to resume care with [facility] for home health RN/PT/OT/HHA, and use [facility] for palliative services.   I see [facility] is already on board. I reached out to [facility], SW, and [place], RN, to let them know family's preferences. [facility] liaison can be reached at [phone].  Marjie Skiff Raijon Lindfors, RN, BSN, [facility] Palliative Medicine Team 10/26/2020 3:51 PM Office [phone]

## 2020-10-27 ENCOUNTER — Other Ambulatory Visit (HOSPITAL_COMMUNITY): Payer: Self-pay | Admitting: Cardiology

## 2020-10-27 DIAGNOSIS — I480 Paroxysmal atrial fibrillation: Secondary | ICD-10-CM | POA: Diagnosis not present

## 2020-10-27 DIAGNOSIS — I5043 Acute on chronic combined systolic (congestive) and diastolic (congestive) heart failure: Secondary | ICD-10-CM | POA: Diagnosis not present

## 2020-10-27 DIAGNOSIS — I5023 Acute on chronic systolic (congestive) heart failure: Secondary | ICD-10-CM | POA: Diagnosis not present

## 2020-10-27 DIAGNOSIS — I428 Other cardiomyopathies: Secondary | ICD-10-CM | POA: Diagnosis not present

## 2020-10-27 LAB — BASIC METABOLIC PANEL
Anion gap: 10 (ref 5–15)
BUN: 36 mg/dL — ABNORMAL HIGH (ref 8–23)
CO2: 25 mmol/L (ref 22–32)
Calcium: 7.7 mg/dL — ABNORMAL LOW (ref 8.9–10.3)
Chloride: 101 mmol/L (ref 98–111)
Creatinine, Ser: 1.45 mg/dL — ABNORMAL HIGH (ref 0.44–1.00)
GFR, Estimated: 36 mL/min — ABNORMAL LOW (ref >60)
Glucose, Bld: 126 mg/dL — ABNORMAL HIGH (ref 70–99)
Potassium: 3 mmol/L — ABNORMAL LOW (ref 3.5–5.1)
Sodium: 136 mmol/L (ref 135–145)

## 2020-10-27 LAB — MAGNESIUM: Magnesium: 1.8 mg/dL (ref 1.7–2.4)

## 2020-10-27 LAB — CULTURE, BLOOD (ROUTINE X 2)
Culture: NO GROWTH
Special Requests: ADEQUATE

## 2020-10-27 LAB — COOXEMETRY PANEL
Carboxyhemoglobin: 1.2 % (ref 0.5–1.5)
Methemoglobin: 0.9 % (ref 0.0–1.5)
O2 Saturation: 48.8 %
Total hemoglobin: 9.5 g/dL — ABNORMAL LOW (ref 12.0–16.0)

## 2020-10-27 LAB — T4, FREE: Free T4: 1.04 ng/dL (ref 0.61–1.12)

## 2020-10-27 MED ORDER — ADULT MULTIVITAMIN W/MINERALS CH
1.0000 | ORAL_TABLET | Freq: Every day | ORAL | Status: DC
  Administered 2020-10-27 – 2020-10-29 (×3): 1 via ORAL
  Filled 2020-10-27 (×4): qty 1

## 2020-10-27 MED ORDER — POTASSIUM CHLORIDE CRYS ER 20 MEQ PO TBCR
40.0000 meq | EXTENDED_RELEASE_TABLET | Freq: Once | ORAL | Status: AC
  Administered 2020-10-27: 40 meq via ORAL
  Filled 2020-10-27: qty 2

## 2020-10-27 MED ORDER — POTASSIUM CHLORIDE CRYS ER 20 MEQ PO TBCR
40.0000 meq | EXTENDED_RELEASE_TABLET | ORAL | Status: AC
  Administered 2020-10-27 (×2): 40 meq via ORAL
  Filled 2020-10-27 (×3): qty 2

## 2020-10-27 MED ORDER — ENSURE ENLIVE PO LIQD
237.0000 mL | Freq: Two times a day (BID) | ORAL | Status: DC
  Administered 2020-10-27 – 2020-10-29 (×3): 237 mL via ORAL

## 2020-10-27 MED ORDER — MAGNESIUM SULFATE 2 GM/50ML IV SOLN
2.0000 g | Freq: Once | INTRAVENOUS | Status: AC
  Administered 2020-10-27: 2 g via INTRAVENOUS
  Filled 2020-10-27: qty 50

## 2020-10-27 MED ORDER — MILRINONE LACTATE IN DEXTROSE 20-5 MG/100ML-% IV SOLN
0.5000 ug/kg/min | INTRAVENOUS | Status: DC
  Administered 2020-10-28 – 2020-10-29 (×5): 0.5 ug/kg/min via INTRAVENOUS
  Filled 2020-10-27 (×5): qty 100

## 2020-10-27 MED ORDER — LEVOTHYROXINE SODIUM 50 MCG PO TABS
50.0000 ug | ORAL_TABLET | Freq: Every day | ORAL | Status: DC
  Administered 2020-10-27 – 2020-10-29 (×3): 50 ug via ORAL
  Filled 2020-10-27 (×4): qty 1

## 2020-10-27 NOTE — Progress Notes (Signed)
Occupational Therapy Treatment Patient Details Name: Cheryl Vargas MRN: [id number] DOB: May 10, 1935 Today's Date: 10/27/2020    History of present illness Pt is a 86 y.o. female with medical history of nonischemic cardiomyopathy, chronic systolic congestive heart failure with ejection fraction of 20 to 25%, proximal A. fib, hyperlipidemia, hypertension, severe mitral valve regurgitation, CKD stage IIIb, L TKA who presents to the emergency department with complaints of generalized weakness, bilateral lower extremity swelling.  Patient states that she has been more fatigued than usual and also fell few times at home.  She follows with heart failure clinic.  She is on milrinone drip at home. Pt admitted with acute on chronic CHF and AKI. Noted pt with outpt palliative services in the past and plans to restart at discharge.   OT comments  Patient with improved mobility this date compared to eval date.  She remains emotionally liable, she has a lot of decisions to make regarding the future of her care, and life expectancy.  OT provided support and discussed some of the options available within the hospital for guidance, and just to talk.  Patient verbalized understanding.  OT will continue to follow in the acute setting.  Her goal is to be able to walk with the 4WRW, and improve her remaining time. She would enjoy going outside at home. HH has been recommended to assist with her transition home.    Follow Up Recommendations  Home health OT;Supervision/Assistance - 24 hour    Equipment Recommendations  None recommended by OT    Recommendations for Other Services      Precautions / Restrictions Precautions Precautions: Fall Restrictions Weight Bearing Restrictions: No       Mobility Bed Mobility   Bed Mobility: Supine to Sit     Supine to sit: Modified independent (Device/Increase time)        Transfers Overall transfer level: Needs assistance Equipment used: 1 person hand held  assist Transfers: Sit to/from [facility] Sit to Stand: Min guard Stand pivot transfers: Min guard       General transfer comment: improved balance and activity tolerance.    Balance   Sitting-balance support: No upper extremity supported;Feet supported Sitting balance-Leahy Scale: Good     Standing balance support: Single extremity supported Standing balance-Leahy Scale: Fair                             ADL either performed or assessed with clinical judgement   ADL   Eating/Feeding: Independent;Sitting   Grooming: Set up;Sitting           Upper Body Dressing : Set up;Sitting                                             Cognition   A&Ox4                                                                Pertinent Vitals/ Pain       Pain Assessment: No/denies pain  Frequency  Min 2X/week        Progress Toward Goals  OT Goals(current goals can now be found in the care plan section)        Plan Discharge plan remains appropriate    Co-evaluation                 AM-PAC OT "6 Clicks" Daily Activity     Outcome Measure   Help from another person eating meals?: None Help from another person taking care of personal grooming?: A Little Help from another person toileting, which includes using toliet, bedpan, or urinal?: A Little Help from another person bathing (including washing, rinsing, drying)?: A Lot Help from another person to put on and taking off regular upper body clothing?: A Little Help from another person to put on and taking off regular lower body clothing?: A Lot 6 Click Score: 17    End of Session    OT Visit Diagnosis: Unsteadiness on feet (R26.81);Other abnormalities of gait and mobility (R26.89);Muscle weakness (generalized) (M62.81);History of falling (Z91.81)   Activity  Tolerance Patient tolerated treatment well   Patient Left in chair;with call bell/phone within reach;with nursing/sitter in room   Nurse Communication Other (comment) (pure wick came out)        Time: 1886-7737 OT Time Calculation (min): 21 min  Charges: OT General Charges $OT Visit: 1 Visit OT Treatments $Self Care/Home Management : 8-22 mins  10/27/2020  Rich, OTR/L  Acute Rehabilitation Services  Office:  [place] 10/27/2020, 5:20 PM

## 2020-10-27 NOTE — Progress Notes (Addendum)
Initial Nutrition Assessment  DOCUMENTATION CODES:   Severe malnutrition in context of chronic illness  INTERVENTION:    Carnation Breakfast Essentials BID with meals, each supplement when mixed with 8 ounces of 2% milk provides 260 kcal and 13 gm protein.  Magic cup TID with meals, each supplement provides 290 kcal and 9 grams of protein.  Ensure Enlive po BID, each supplement provides 350 kcal and 20 grams of protein.  MVI with minerals daily.  Provided education on adequate intake for undernourished CHF patients.  NUTRITION DIAGNOSIS:   Severe Malnutrition related to chronic illness (CHF) as evidenced by severe muscle depletion,severe fat depletion.  GOAL:   Patient will meet greater than or equal to 90% of their needs  MONITOR:   PO intake,Supplement acceptance,Labs  REASON FOR ASSESSMENT:   Consult Diet education  ASSESSMENT:   86 yo female admitted with CHF exacerbation. PMH includes arthritis, CHF, mitral regurgitation, A fib, NICM, v tach.  Patient reports that she has been feeling poorly this morning. She is tired. She c/o the altered taste causing decreased appetite and poor PO intake.   Currently on a heart healthy diet. Meal intakes: 10-75%  Patient agreed to drink Ensure twice daily. Will also send Carnation breakfast essentials and magic cups with meals to maximize oral intake.  Patient is being followed by TEFL teacher for palliative services.  Labs reviewed. K 3 Medications reviewed and include calcium carbonate, Lasix, Mag Ox, MVI with minerals, potassium chloride.  Provided "Heart Failure Nutrition Therapy for the Undernourished" handout from the [employer]. Discussed with patient ways to eat/drink extra calories and protein with exceeding dietary limits for CHF.   NUTRITION - FOCUSED PHYSICAL EXAM:  Flowsheet Row Most Recent Value  Orbital Region Severe depletion  Upper Arm Region Severe depletion   Thoracic and Lumbar Region Severe depletion  Buccal Region Severe depletion  Temple Region Severe depletion  Clavicle Bone Region Severe depletion  Clavicle and Acromion Bone Region Severe depletion  Scapular Bone Region Severe depletion  Dorsal Hand Severe depletion  Patellar Region Moderate depletion  Anterior Thigh Region Moderate depletion  Posterior Calf Region Moderate depletion  Edema (RD Assessment) None  Hair Reviewed  Eyes Reviewed  Mouth Reviewed  Skin Reviewed  Nails Reviewed       Diet Order:   Diet Order            Diet Heart Room service appropriate? Yes; Fluid consistency: Thin  Diet effective now                 EDUCATION NEEDS:   Education needs have been addressed  Skin:  Skin Assessment: Reviewed RN Assessment  Last BM:  2/15  Height:   Ht Readings from Last 1 Encounters:  10/26/20 5\' 5"  (1.651 m)    Weight:   Wt Readings from Last 1 Encounters:  10/27/20 59.1 kg    Ideal Body Weight:  56.8 kg  BMI:  Body mass index is 21.68 kg/m.  Estimated Nutritional Needs:   Kcal:  1600-1800  Protein:  75-90 gm  Fluid:  1.6 L    Lucas Mallow, RD, LDN, CNSC Please refer to Amion for contact information.

## 2020-10-27 NOTE — Progress Notes (Addendum)
Advanced Heart Failure Rounding Note  PCP-Cardiologist: [name], MD   Subjective:    2/14 milrinone increased to 0.375 mcg   Yesterday diuresed with IV lasix.   Creatinine 1.7>1.45  CO-OX 46>51>49%    Feels terrible. Denies SOB. Says she didn't sleep well.     Objective:   Weight Range: 59.1 kg Body mass index is 21.68 kg/m.   Vital Signs:   Temp:  [97.4 F (36.3 C)-98.6 F (37 C)] 98.6 F (37 C) (02/16 0423) Pulse Rate:  [60-80] 60 (02/16 0423) Resp:  [16-18] 18 (02/16 0423) BP: (100-122)/(49-68) 100/49 (02/16 0423) SpO2:  [86 %-96 %] 96 % (02/16 0423) Weight:  [59.1 kg] 59.1 kg (02/16 0423) Last BM Date: 10/26/20  Weight change: Filed Weights   10/25/20 0948 10/26/20 0443 10/27/20 0423  Weight: 56.1 kg 58.3 kg 59.1 kg    Intake/Output:   Intake/Output Summary (Last 24 hours) at 10/27/2020 1023 Last data filed at 10/27/2020 0258 Gross per 24 hour  Intake 237 ml  Output 2100 ml  Net -1863 ml      Physical Exam  CVP 14  General:  . No resp difficulty HEENT: normal Neck: supple. JVP 11-12 . Carotids 2+ bilat; no bruits. No lymphadenopathy or thryomegaly appreciated. Cor: PMI nondisplaced. Irregular  rate & rhythm. No rubs, gallops or murmurs. R upper chest tunneled catheter.  Lungs: clear Abdomen: soft, nontender, nondistended. No hepatosplenomegaly. No bruits or masses. Good bowel sounds. Extremities: no cyanosis, clubbing, rash, edema Neuro: alert & orientedx3, cranial nerves grossly intact. moves all 4 extremities w/o difficulty. Affect pleasant   Telemetry  Paced with occasional NSVT/ PVCs 80s   EKG   none  Labs    CBC No results for input(s): WBC, NEUTROABS, HGB, HCT, MCV, PLT in the last 72 hours. Basic Metabolic Panel Recent Labs    10/26/20 0430 10/27/20 0510  NA 134* 136  K 3.7 3.0*  CL 103 101  CO2 21* 25  GLUCOSE 117* 126*  BUN 40* 36*  CREATININE 1.67* 1.45*  CALCIUM 8.2* 7.7*  MG  --  1.8   Liver Function  Tests No results for input(s): AST, ALT, ALKPHOS, BILITOT, PROT, ALBUMIN in the last 72 hours. No results for input(s): LIPASE, AMYLASE in the last 72 hours. Cardiac Enzymes No results for input(s): CKTOTAL, CKMB, CKMBINDEX, TROPONINI in the last 72 hours.  BNP: BNP (last 3 results) Recent Labs    08/02/20 0832 10/22/20 1339  BNP 1,285.0* 1,503.8*    ProBNP (last 3 results) No results for input(s): PROBNP in the last 8760 hours.   D-Dimer No results for input(s): DDIMER in the last 72 hours. Hemoglobin A1C No results for input(s): HGBA1C in the last 72 hours. Fasting Lipid Panel No results for input(s): CHOL, HDL, LDLCALC, TRIG, CHOLHDL, LDLDIRECT in the last 72 hours. Thyroid Function Tests Recent Labs    10/26/20 0430  TSH 10.623*    Other results:   Imaging    No results found.   Medications:     Scheduled Medications: . amiodarone  200 mg Oral Daily  . calcium carbonate  1 tablet Oral TID WC  . Chlorhexidine Gluconate Cloth  6 each Topical Daily  . furosemide  80 mg Intravenous BID  . levothyroxine  50 mcg Oral QAC breakfast  . magnesium oxide  200 mg Oral BID  . midodrine  10 mg Oral BID WC  . pantoprazole  40 mg Oral Daily  . potassium chloride  40 mEq  Oral Q4H  . Rivaroxaban  15 mg Oral Q supper  . rOPINIRole  0.5 mg Oral Q2000  . sodium chloride flush  10-40 mL Intracatheter Q12H    Infusions: . milrinone 0.375 mcg/kg/min (10/26/20 2252)    PRN Medications: acetaminophen, LORazepam, ondansetron (ZOFRAN) IV, oxyCODONE, senna-docusate, sodium chloride flush, zolpidem     Assessment/Plan   1. End Stage Heart Failure -On Milrinone 0.25 mcg via tunneled PICC - BNP on admit >1500.  - Off diuretics since 2/11. IV fluids stopped 2/14 - CVP 14.  - Continue IV lasix. Supp K.   - Continue Milrinone .375 mcg. CO-OX 45>51>49%. I am hesitant to increase to 0.5 mcg with ectopy.    -  no bb with low output - Continue midodrine 10 mg twice a  day - GDMT limited by hypotension  2. AKI  -Creatinine on admit 2.3. Off diuretics and receiving IV fluids. Creatinine 1.45 today .  -No NSAIDs  3. Hyperkalemia -Resolved.   4. PVCs Continue amio 200 mg daily TSH 11.  Primary team start synthroid.   5.PAF S/P AV nodal ablation/BiV pacing On xarelto  6. Mitral Regurgitation Severe on Echo 12/21  7. DNR     Length of Stay: 5  [name] Clegg, NP  10/27/2020, 10:23 AM  Advanced Heart Failure Team Pager [phone] (M-F; 7a - 4p)  Please contact [facility] for night-coverage after hours (4p -7a ) and weekends on [email or web address]  Patient seen with NP, agree with the above note.   Feels worse again today.  CVP 14, good diuresis yesterday.  Co-ox remains low at 49%.  Creatinine lower 1.45.   General: NAD Neck: JVP 12-14 cm, no thyromegaly or thyroid nodule.  Lungs: Clear to auscultation bilaterally with normal respiratory effort. CV: Nondisplaced PMI.  Heart regular S1/S2, no S3/S4, no murmur.  No peripheral edema.   Abdomen: Soft, nontender, no hepatosplenomegaly, no distention.  Skin: Intact without lesions or rashes.  Neurologic: Alert and oriented x 3.  Psych: Normal affect. Extremities: No clubbing or cyanosis.  HEENT: Normal.   Still with low output symptoms and volume overload.  Co-ox 49%.   - Not ideal given ectopy, but will try on higher dose of milrinone 0.5.   - Continue Lasix 80 mg IV bid.  - BP stable on current midodrine.   Continue on amiodarone for PVCs.   Continue Xarelto for chronic afib.   Levoxyl started for hypothyroidism.   Discussed with son [name].  [name] 10/27/2020 3:43 PM

## 2020-10-27 NOTE — Progress Notes (Signed)
PT Cancellation Note  Patient Details Name: Cheryl Vargas MRN: [id number] DOB: [birth year]   Cancelled Treatment:    Reason Eval/Treat Not Completed: Other (comment).  Pt was occupied x 2 with staff care and then finally was too sleepy to work with PT.  Follow along as pt and time allow.   Ramond Dial 10/27/2020, 2:11 PM Mee Hives, PT MS Acute Rehab Dept. Number: [place]

## 2020-10-27 NOTE — Care Management Important Message (Signed)
Important Message  Patient Details  Name: Cheryl Vargas MRN: [id number] Date of Birth: [birth year]   Medicare Important Message Given:  Yes     [name] 10/27/2020, 10:36 AM

## 2020-10-27 NOTE — Progress Notes (Signed)
PROGRESS NOTE    Cheryl Vargas  GYK:[id number] DOB: 1935-03-20 DOA: 10/22/2020 PCP: Lavone Orn, MD   Chief Complain: Weakness  Brief Narrative: Cheryl Vargas is a 86 y.o. female with medical history of nonischemic cardiomyopathy, chronic systolic congestive heart failure with ejection fraction of 20 to 25%, proximal A. fib, hyperlipidemia, hypertension, severe mitral valve regurgitation, CKD stage IIIb, Status post total left knee replacement who presents to the emergency department with complaints of generalized weakness, bilateral lower extremity swelling.  Patient states that she has been more fatigued than usual and also fell few times at home.  She follows with heart failure clinic.  She is on milrinone drip at home.  She follows with home health.  Patient also reported that she had a weight gain of 2 pounds in the last week. On presentation, she was also found to have AKI on CKD. Patient was not eating or drinking well since last few days at home,so started on gentle IV fluids now stopped. CHF team   following.  On milrinone infusion and IV Lasix  Assessment & Plan:   Principal Problem:   Acute exacerbation of CHF (congestive heart failure) (HCC) Active Problems:   Paroxysmal atrial fibrillation (HCC)   Non-ischemic cardiomyopathy (HCC)    Acute on chronic systolic congestive heart failure: Presented with bilateral lower extremity swelling, weight gain.  Takes Lasix and palliative milrinone at home.  Follows with home health therapy.  Poor candidate for LVAD Continue monitoring input/output, daily weight Started on  home milrinone dose. Last echocardiogram on 07/2020 had shown ejection fraction of 20 to 25%. Cardiology following Currently on Lasix 80 mg twice daily.  AKI on CKD stage IIIb: Her last creatinine was around 1.21.  Baseline creatinine around 1.2.  Presented with creatinine in the range of 2. Apparently we knew that she was taking more Lasix than usual and was  having poor oral intake at home. Started on gentle IV fluids on admission, with improvement in the kidney function.  Fluids stopped now.  Creatinine started trending up.  Suspected to be volume overloaded.  Started on Lasix IV 80 mg BID  troponin elevation: Denies any chest pain.  This is from supply demand ischemia from severe congestive heart failure.  Subclinical Hypothyroidism: TSH of 10.6. Normal  T4.  Will initiate low-dose thyroxine  Hypokalemia: Supplemented with potassium.  Nonischemic cardiomyopathy: She underwent cardiac cath on 04/2016 which showed normal coronaries.  Paroxysmal A. fib: On Xarelto, amiodarone.  Status post AV node ablation and biventricular pacing.  Has history of frequent PVCs/VT.  History of severe MR/severe TR/moderate AI: Follows with cardiology.  Not a candidate for intervention.  Goals of care: Elderly patient with multiple comorbidities.  On home milrinone therapy for palliative purpose.  CODE STATUS is DNR.  Cardiology has previously recommended transition to hospice if her overall condition worsens.    Palliative care has been following.  The goal is to discharge her to home with milrinone therapy.  She should follow-up with outpatient palliative care  Debility/deconditioning/falls: Complains of generalized weakness, weight gain.  She is status post total left hip replacement.    PT/OT recommended home health on discharge.  She also had some bruise on her left hip after recent fall.  X-ray of the hip did not show any fracture        DVT prophylaxis:Xarelto Code Status: Full Family Communication: Discussed with son on phone on 10/22/2020 Status is: Inpatient  Remains inpatient appropriate because:Inpatient level of care appropriate  due to severity of illness   Dispo: The patient is from: Home              Anticipated d/c is to: Home              Anticipated d/c date is: 2 days              Patient currently is not medically stable to d/c.    Difficult to place patient No  Needs cardiology clearance before discharge   Consultants: Cardiology  Procedures:None  Antimicrobials:  Anti-infectives (From admission, onward)   None      Subjective: Patient seen and examined the bedside this morning.  Hemodynamically stable during my evaluation.  States that she feels terrible, could not sleep last night.  Denies any shortness of breath or chest pain.  Objective: Vitals:   10/26/20 2100 10/26/20 2300 10/26/20 2330 10/27/20 0423  BP: (!) 103/50 (!) 116/58 122/65 (!) 100/49  Pulse: 80   60  Resp: 18   18  Temp: 98 F (36.7 C)   98.6 F (37 C)  TempSrc: Oral   Oral  SpO2: 93% 93% 91% 96%  Weight:    59.1 kg  Height:        Intake/Output Summary (Last 24 hours) at 10/27/2020 0807 Last data filed at 10/27/2020 0258 Gross per 24 hour  Intake 237 ml  Output 2100 ml  Net -1863 ml   Filed Weights   10/25/20 0948 10/26/20 0443 10/27/20 0423  Weight: 56.1 kg 58.3 kg 59.1 kg    Examination:  General exam: Chronically looking, weak, bedbound mostly Respiratory system: Bilateral diminished air sounds, no wheezes or crackles  Cardiovascular system: S1 & S2 heard, RRR. No JVD, murmurs, rubs, gallops or clicks.  Central line on the right chest, pacemaker Gastrointestinal system: Abdomen is nondistended, soft and nontender. No organomegaly or masses felt. Normal bowel sounds heard. Central nervous system: Alert and oriented. No focal neurological deficits. Extremities: No edema, no clubbing ,no cyanosis, distal peripheral pulses palpable. Skin: No rashes, lesions or ulcers,no icterus ,no pallor pychiatry: Judgement and insight appear normal. Mood & affect appropriate.      Data Reviewed: I have personally reviewed following labs and imaging studies  CBC: Recent Labs  Lab 10/22/20 1227 10/23/20 0317  WBC 6.0 5.8  NEUTROABS 4.4  --   HGB 10.8* 10.6*  HCT 33.9* 32.4*  MCV 83.1 83.1  PLT 224 702   Basic Metabolic  Panel: Recent Labs  Lab 10/22/20 1513 10/23/20 0317 10/24/20 0259 10/25/20 0632 10/26/20 0430 10/27/20 0510  NA  --  134* 135 133* 134* 136  K  --  3.8 4.8 5.3* 3.7 3.0*  CL  --  101 104 106 103 101  CO2  --  22 20* 19* 21* 25  GLUCOSE  --  111* 118* 115* 117* 126*  BUN  --  48* 45* 42* 40* 36*  CREATININE  --  1.95* 1.70* 1.55* 1.67* 1.45*  CALCIUM  --  8.8* 8.7* 8.4* 8.2* 7.7*  MG 1.5* 2.4  --   --   --  1.8   GFR: Estimated Creatinine Clearance: 26.5 mL/min (A) (by C-G formula based on SCr of 1.45 mg/dL (H)). Liver Function Tests: Recent Labs  Lab 10/22/20 1227  AST 47*  ALT 39  ALKPHOS 66  BILITOT 1.5*  PROT 7.5  ALBUMIN 3.5   No results for input(s): LIPASE, AMYLASE in the last 168 hours. No results for input(s): AMMONIA in  the last 168 hours. Coagulation Profile: Recent Labs  Lab 10/22/20 1227  INR 2.9*   Cardiac Enzymes: No results for input(s): CKTOTAL, CKMB, CKMBINDEX, TROPONINI in the last 168 hours. BNP (last 3 results) No results for input(s): PROBNP in the last 8760 hours. HbA1C: No results for input(s): HGBA1C in the last 72 hours. CBG: No results for input(s): GLUCAP in the last 168 hours. Lipid Profile: No results for input(s): CHOL, HDL, LDLCALC, TRIG, CHOLHDL, LDLDIRECT in the last 72 hours. Thyroid Function Tests: Recent Labs    10/26/20 0430 10/27/20 0510  TSH 10.623*  --   FREET4  --  1.04   Anemia Panel: No results for input(s): VITAMINB12, FOLATE, FERRITIN, TIBC, IRON, RETICCTPCT in the last 72 hours. Sepsis Labs: Recent Labs  Lab 10/22/20 1331  LATICACIDVEN 1.6    Recent Results (from the past 240 hour(s))  Culture, blood (Routine x 2)     Status: None (Preliminary result)   Collection Time: 10/22/20 12:27 PM   Specimen: BLOOD  Result Value Ref Range Status   Specimen Description BLOOD RIGHT ANTECUBITAL  Final   Special Requests   Final    BOTTLES DRAWN AEROBIC AND ANAEROBIC Blood Culture adequate volume   Culture    Final    NO GROWTH 3 DAYS Performed at [facility], [address] [place], [place] [address]    Report Status PENDING  Incomplete  Resp Panel by RT-PCR (Flu A&B, Covid) Nasopharyngeal Swab     Status: None   Collection Time: 10/22/20  2:26 PM   Specimen: Nasopharyngeal Swab; Nasopharyngeal(NP) swabs in vial transport medium  Result Value Ref Range Status   SARS Coronavirus 2 by RT PCR NEGATIVE NEGATIVE Final    Comment: (NOTE) SARS-CoV-2 target nucleic acids are NOT DETECTED.  The SARS-CoV-2 RNA is generally detectable in upper respiratory specimens during the acute phase of infection. The lowest concentration of SARS-CoV-2 viral copies this assay can detect is 138 copies/mL. A negative result does not preclude SARS-Cov-2 infection and should not be used as the sole basis for treatment or other patient management decisions. A negative result may occur with  improper specimen collection/handling, submission of specimen other than nasopharyngeal swab, presence of viral mutation(s) within the areas targeted by this assay, and inadequate number of viral copies(<138 copies/mL). A negative result must be combined with clinical observations, patient history, and epidemiological information. The expected result is Negative.  Fact Sheet for Patients:  EntrepreneurPulse.com.au  Fact Sheet for Healthcare Providers:  IncredibleEmployment.be  This test is no t yet approved or cleared by the [place] FDA and  has been authorized for detection and/or diagnosis of SARS-CoV-2 by FDA under an Emergency Use Authorization (EUA). This EUA will remain  in effect (meaning this test can be used) for the duration of the COVID-19 declaration under Section 564(b)(1) of the Act, 21 U.S.C.section 360bbb-3(b)(1), unless the authorization is terminated  or revoked sooner.       Influenza A by PCR NEGATIVE NEGATIVE Final   Influenza B by PCR NEGATIVE  NEGATIVE Final    Comment: (NOTE) The Xpert Xpress SARS-CoV-2/FLU/RSV plus assay is intended as an aid in the diagnosis of influenza from Nasopharyngeal swab specimens and should not be used as a sole basis for treatment. Nasal washings and aspirates are unacceptable for Xpert Xpress SARS-CoV-2/FLU/RSV testing.  Fact Sheet for Patients: EntrepreneurPulse.com.au  Fact Sheet for Healthcare Providers: IncredibleEmployment.be  This test is not yet approved or cleared by the [place] FDA  and has been authorized for detection and/or diagnosis of SARS-CoV-2 by FDA under an Emergency Use Authorization (EUA). This EUA will remain in effect (meaning this test can be used) for the duration of the COVID-19 declaration under Section 564(b)(1) of the Act, 21 U.S.C. section 360bbb-3(b)(1), unless the authorization is terminated or revoked.  Performed at [facility], [place] [address], [place], [place] [address]          Radiology Studies: No results found.      Scheduled Meds:  amiodarone  200 mg Oral Daily   calcium carbonate  1 tablet Oral TID WC   Chlorhexidine Gluconate Cloth  6 each Topical Daily   furosemide  80 mg Intravenous BID   magnesium oxide  200 mg Oral BID   midodrine  10 mg Oral BID WC   pantoprazole  40 mg Oral Daily   potassium chloride  40 mEq Oral Q4H   Rivaroxaban  15 mg Oral Q supper   rOPINIRole  0.5 mg Oral Q2000   sodium chloride flush  10-40 mL Intracatheter Q12H   Continuous Infusions:  magnesium sulfate bolus IVPB     milrinone 0.375 mcg/kg/min (10/26/20 2252)     LOS: 5 days    Time spent:25 mins. More than 50% of that time was spent in counseling and/or coordination of care.      Shelly Coss, MD [facility] P2/16/2022, 8:07 AM

## 2020-10-28 DIAGNOSIS — I5043 Acute on chronic combined systolic (congestive) and diastolic (congestive) heart failure: Secondary | ICD-10-CM | POA: Diagnosis not present

## 2020-10-28 DIAGNOSIS — I480 Paroxysmal atrial fibrillation: Secondary | ICD-10-CM | POA: Diagnosis not present

## 2020-10-28 DIAGNOSIS — I428 Other cardiomyopathies: Secondary | ICD-10-CM | POA: Diagnosis not present

## 2020-10-28 DIAGNOSIS — E43 Unspecified severe protein-calorie malnutrition: Secondary | ICD-10-CM | POA: Insufficient documentation

## 2020-10-28 DIAGNOSIS — I5023 Acute on chronic systolic (congestive) heart failure: Secondary | ICD-10-CM | POA: Diagnosis not present

## 2020-10-28 LAB — COOXEMETRY PANEL
Carboxyhemoglobin: 1.5 % (ref 0.5–1.5)
Methemoglobin: 0.9 % (ref 0.0–1.5)
O2 Saturation: 59.3 %
Total hemoglobin: 9.6 g/dL — ABNORMAL LOW (ref 12.0–16.0)

## 2020-10-28 LAB — MAGNESIUM: Magnesium: 2 mg/dL (ref 1.7–2.4)

## 2020-10-28 LAB — BASIC METABOLIC PANEL
Anion gap: 10 (ref 5–15)
BUN: 30 mg/dL — ABNORMAL HIGH (ref 8–23)
CO2: 28 mmol/L (ref 22–32)
Calcium: 7.9 mg/dL — ABNORMAL LOW (ref 8.9–10.3)
Chloride: 98 mmol/L (ref 98–111)
Creatinine, Ser: 1.21 mg/dL — ABNORMAL HIGH (ref 0.44–1.00)
GFR, Estimated: 44 mL/min — ABNORMAL LOW (ref >60)
Glucose, Bld: 95 mg/dL (ref 70–99)
Potassium: 3.7 mmol/L (ref 3.5–5.1)
Sodium: 136 mmol/L (ref 135–145)

## 2020-10-28 LAB — T3, FREE: T3, Free: 1.7 pg/mL — ABNORMAL LOW (ref 2.0–4.4)

## 2020-10-28 MED ORDER — METOLAZONE 5 MG PO TABS
2.5000 mg | ORAL_TABLET | Freq: Once | ORAL | Status: AC
  Administered 2020-10-28: 2.5 mg via ORAL
  Filled 2020-10-28: qty 1

## 2020-10-28 MED ORDER — POTASSIUM CHLORIDE CRYS ER 20 MEQ PO TBCR
40.0000 meq | EXTENDED_RELEASE_TABLET | ORAL | Status: AC
  Administered 2020-10-28 (×2): 40 meq via ORAL
  Filled 2020-10-28 (×2): qty 2

## 2020-10-28 NOTE — Progress Notes (Signed)
PROGRESS NOTE    Cheryl Vargas  HMC:[id number] DOB: 12-18-1934 DOA: 10/22/2020 PCP: Lavone Orn, MD   Chief Complain: Weakness  Brief Narrative: Cheryl Vargas is a 86 y.o. female with medical history of nonischemic cardiomyopathy, chronic systolic congestive heart failure with ejection fraction of 20 to 25%, proximal A. fib, hyperlipidemia, hypertension, severe mitral valve regurgitation, CKD stage IIIb, Status post total left knee replacement who presents to the emergency department with complaints of generalized weakness, bilateral lower extremity swelling.  Patient stated that she has been more fatigued than usual and also fell few times at home.  She follows with heart failure clinic.  She is on milrinone drip at home.  She follows with home health.  Patient also reported that she had a weight gain of 2 pounds in the last week. On presentation, she was also found to have AKI on CKD. Patient was not eating or drinking well since last few days at home,so started on gentle IV fluids now stopped. CHF team   following.  On milrinone infusion and IV Lasix  Assessment & Plan:   Principal Problem:   Acute exacerbation of CHF (congestive heart failure) (HCC) Active Problems:   Paroxysmal atrial fibrillation (HCC)   Non-ischemic cardiomyopathy (HCC)   Acute on chronic combined systolic and diastolic CHF (congestive heart failure) (HCC)    Acute on chronic systolic congestive heart failure: Presented with bilateral lower extremity swelling, weight gain.  Takes Lasix and palliative milrinone at home.  Follows with home health therapy.  Poor candidate for LVAD Continue monitoring input/output, daily weight Started on  home milrinone dose. Last echocardiogram on 07/2020 had shown ejection fraction of 20 to 25%. Cardiology following Currently on Lasix 80 mg twice daily.  AKI on CKD stage IIIb: Her last creatinine was around 1.21.  Baseline creatinine around 1.2.  Presented with creatinine in  the range of 2. Apparently  she was taking more Lasix than usual and was having poor oral intake at home. Started on gentle IV fluids on admission, with improvement in the kidney function. but creatinine started trending up again .  Suspected to be volume overloaded.  Started on Lasix IV 80 mg BID. Kidney function is close to baseline now.  troponin elevation: Denies any chest pain.  This is from supply demand ischemia from severe congestive heart failure.  Subclinical Hypothyroidism: TSH of 10.6. Normal  T4.  Will initiate low-dose thyroxine  Nonischemic cardiomyopathy: She underwent cardiac cath on 04/2016 which showed normal coronaries.  Paroxysmal A. fib: On Xarelto, amiodarone.  Status post AV node ablation and biventricular pacing.  Has history of frequent PVCs/VT.  History of severe MR/severe TR/moderate AI: Follows with cardiology.  Not a candidate for intervention.  Goals of care: Elderly patient with multiple comorbidities.  On home milrinone therapy for palliative purpose.  CODE STATUS is DNR.  Cardiology has previously recommended transition to hospice if her overall condition worsens.    Palliative care has been following.  The goal is to discharge her to home with milrinone therapy.  She should follow-up with outpatient palliative care  Debility/deconditioning/falls: Complains of generalized weakness, weight gain.  She is status post total left hip replacement.    PT/OT recommended home health on discharge.  She also had some bruise on her left hip after recent fall.  X-ray of the hip did not show any fracture Nutrition Problem: Severe Malnutrition Etiology: chronic illness (CHF)      DVT prophylaxis:Xarelto Code Status: Full Family Communication: Discussed  with son on phone on 10/27/2020 Status is: Inpatient  Remains inpatient appropriate because:Inpatient level of care appropriate due to severity of illness   Dispo: The patient is from: Home              Anticipated  d/c is to: Home              Anticipated d/c date is: 1- 2 days.Likely tomorrow              Patient currently is not medically stable to d/c.   Difficult to place patient No  Needs cardiology clearance before discharge   Consultants: Cardiology  Procedures:None  Antimicrobials:  Anti-infectives (From admission, onward)   None      Subjective: Patient seen and examined the bedside this morning. She feels comfortable today. Sitting on the chair. She was in good spirit after a long time and feels that she will be able to go home soon.  Objective: Vitals:   10/27/20 1152 10/27/20 2023 10/28/20 0040 10/28/20 0500  BP: (!) 111/58 111/62 (!) 102/59 (!) 92/50  Pulse: 79 80 81 80  Resp: 15 18 16 19   Temp: 98.2 F (36.8 C) 97.6 F (36.4 C) 98 F (36.7 C) 97.8 F (36.6 C)  TempSrc: Oral Oral Oral Oral  SpO2: 93% 98% 92% 93%  Weight:    59.1 kg  Height:        Intake/Output Summary (Last 24 hours) at 10/28/2020 0809 Last data filed at 10/28/2020 0600 Gross per 24 hour  Intake 1412.61 ml  Output 1950 ml  Net -537.39 ml   Filed Weights   10/26/20 0443 10/27/20 0423 10/28/20 0500  Weight: 58.3 kg 59.1 kg 59.1 kg    Examination:  General exam:Not in distress, comfortable today, chronically ill looking Respiratory system: Bilateral equal air entry, normal vesicular breath sounds, no wheezes or crackles  Cardiovascular system: S1 & S2 heard, RRR. No JVD, murmurs, rubs, gallops or clicks. Central on the right chest, pacemaker Gastrointestinal system: Abdomen is nondistended, soft and nontender. No organomegaly or masses felt. Normal bowel sounds heard. Central nervous system: Alert and oriented. No focal neurological deficits. Extremities: No edema, no clubbing ,no cyanosis Skin: No rashes, lesions or ulcers,no icterus ,no pallor   Data Reviewed: I have personally reviewed following labs and imaging studies  CBC: Recent Labs  Lab 10/22/20 1227 10/23/20 0317  WBC 6.0  5.8  NEUTROABS 4.4  --   HGB 10.8* 10.6*  HCT 33.9* 32.4*  MCV 83.1 83.1  PLT 224 353   Basic Metabolic Panel: Recent Labs  Lab 10/22/20 1513 10/23/20 0317 10/24/20 0259 10/25/20 0632 10/26/20 0430 10/27/20 0510 10/28/20 0500  NA  --  134* 135 133* 134* 136 136  K  --  3.8 4.8 5.3* 3.7 3.0* 3.7  CL  --  101 104 106 103 101 98  CO2  --  22 20* 19* 21* 25 28  GLUCOSE  --  111* 118* 115* 117* 126* 95  BUN  --  48* 45* 42* 40* 36* 30*  CREATININE  --  1.95* 1.70* 1.55* 1.67* 1.45* 1.21*  CALCIUM  --  8.8* 8.7* 8.4* 8.2* 7.7* 7.9*  MG 1.5* 2.4  --   --   --  1.8 2.0   GFR: Estimated Creatinine Clearance: 31.7 mL/min (A) (by C-G formula based on SCr of 1.21 mg/dL (H)). Liver Function Tests: Recent Labs  Lab 10/22/20 1227  AST 47*  ALT 39  ALKPHOS 66  BILITOT  1.5*  PROT 7.5  ALBUMIN 3.5   No results for input(s): LIPASE, AMYLASE in the last 168 hours. No results for input(s): AMMONIA in the last 168 hours. Coagulation Profile: Recent Labs  Lab 10/22/20 1227  INR 2.9*   Cardiac Enzymes: No results for input(s): CKTOTAL, CKMB, CKMBINDEX, TROPONINI in the last 168 hours. BNP (last 3 results) No results for input(s): PROBNP in the last 8760 hours. HbA1C: No results for input(s): HGBA1C in the last 72 hours. CBG: No results for input(s): GLUCAP in the last 168 hours. Lipid Profile: No results for input(s): CHOL, HDL, LDLCALC, TRIG, CHOLHDL, LDLDIRECT in the last 72 hours. Thyroid Function Tests: Recent Labs    10/26/20 0430 10/27/20 0510  TSH 10.623*  --   FREET4  --  1.04  T3FREE  --  1.7*   Anemia Panel: No results for input(s): VITAMINB12, FOLATE, FERRITIN, TIBC, IRON, RETICCTPCT in the last 72 hours. Sepsis Labs: Recent Labs  Lab 10/22/20 1331  LATICACIDVEN 1.6    Recent Results (from the past 240 hour(s))  Culture, blood (Routine x 2)     Status: None   Collection Time: 10/22/20 12:27 PM   Specimen: BLOOD  Result Value Ref Range Status    Specimen Description BLOOD RIGHT ANTECUBITAL  Final   Special Requests   Final    BOTTLES DRAWN AEROBIC AND ANAEROBIC Blood Culture adequate volume   Culture   Final    NO GROWTH 5 DAYS Performed at [facility], [address], [place], [place] [address]    Report Status 10/27/2020 FINAL  Final  Resp Panel by RT-PCR (Flu A&B, Covid) Nasopharyngeal Swab     Status: None   Collection Time: 10/22/20  2:26 PM   Specimen: Nasopharyngeal Swab; Nasopharyngeal(NP) swabs in vial transport medium  Result Value Ref Range Status   SARS Coronavirus 2 by RT PCR NEGATIVE NEGATIVE Final    Comment: (NOTE) SARS-CoV-2 target nucleic acids are NOT DETECTED.  The SARS-CoV-2 RNA is generally detectable in upper respiratory specimens during the acute phase of infection. The lowest concentration of SARS-CoV-2 viral copies this assay can detect is 138 copies/mL. A negative result does not preclude SARS-Cov-2 infection and should not be used as the sole basis for treatment or other patient management decisions. A negative result may occur with  improper specimen collection/handling, submission of specimen other than nasopharyngeal swab, presence of viral mutation(s) within the areas targeted by this assay, and inadequate number of viral copies(<138 copies/mL). A negative result must be combined with clinical observations, patient history, and epidemiological information. The expected result is Negative.  Fact Sheet for Patients:  EntrepreneurPulse.com.au  Fact Sheet for Healthcare Providers:  IncredibleEmployment.be  This test is no t yet approved or cleared by the [place] FDA and  has been authorized for detection and/or diagnosis of SARS-CoV-2 by FDA under an Emergency Use Authorization (EUA). This EUA will remain  in effect (meaning this test can be used) for the duration of the COVID-19 declaration under Section 564(b)(1) of the Act, 21 U.S.C.section  360bbb-3(b)(1), unless the authorization is terminated  or revoked sooner.       Influenza A by PCR NEGATIVE NEGATIVE Final   Influenza B by PCR NEGATIVE NEGATIVE Final    Comment: (NOTE) The Xpert Xpress SARS-CoV-2/FLU/RSV plus assay is intended as an aid in the diagnosis of influenza from Nasopharyngeal swab specimens and should not be used as a sole basis for treatment. Nasal washings and aspirates are unacceptable for  Xpert Xpress SARS-CoV-2/FLU/RSV testing.  Fact Sheet for Patients: EntrepreneurPulse.com.au  Fact Sheet for Healthcare Providers: IncredibleEmployment.be  This test is not yet approved or cleared by the [place] FDA and has been authorized for detection and/or diagnosis of SARS-CoV-2 by FDA under an Emergency Use Authorization (EUA). This EUA will remain in effect (meaning this test can be used) for the duration of the COVID-19 declaration under Section 564(b)(1) of the Act, 21 U.S.C. section 360bbb-3(b)(1), unless the authorization is terminated or revoked.  Performed at [facility], [place] [address] [place], [place] [address]          Radiology Studies: No results found.      Scheduled Meds: . amiodarone  200 mg Oral Daily  . calcium carbonate  1 tablet Oral TID WC  . Chlorhexidine Gluconate Cloth  6 each Topical Daily  . feeding supplement  237 mL Oral BID BM  . furosemide  80 mg Intravenous BID  . levothyroxine  50 mcg Oral QAC breakfast  . magnesium oxide  200 mg Oral BID  . midodrine  10 mg Oral BID WC  . multivitamin with minerals  1 tablet Oral Daily  . pantoprazole  40 mg Oral Daily  . potassium chloride  40 mEq Oral Q4H  . Rivaroxaban  15 mg Oral Q supper  . rOPINIRole  0.5 mg Oral Q2000  . sodium chloride flush  10-40 mL Intracatheter Q12H   Continuous Infusions: . milrinone 0.5 mcg/kg/min (10/28/20 0320)     LOS: 6 days    Time spent:25 mins. More than 50% of that time was  spent in counseling and/or coordination of care.      Shelly Coss, MD [facility] P2/17/2022, 8:09 AM

## 2020-10-28 NOTE — Progress Notes (Signed)
   10/28/20 1226  Mobility  Activity Refused mobility (Patient declined still tired from working with PT earlier.)

## 2020-10-28 NOTE — Progress Notes (Addendum)
Advanced Heart Failure Rounding Note  PCP-Cardiologist: [name], MD   Subjective:    2/14 milrinone increased to 0.375 mcg  2/16  Milrinone increased to 0.5 mcg.   Todays CO-OX 58%.   Feels better today. Appetite improving.   Objective:   Weight Range: 59.1 kg Body mass index is 21.68 kg/m.   Vital Signs:   Temp:  [97.6 F (36.4 C)-98.2 F (36.8 C)] 97.8 F (36.6 C) (02/17 0500) Pulse Rate:  [79-81] 80 (02/17 0500) Resp:  [15-19] 19 (02/17 0500) BP: (92-111)/(50-62) 92/50 (02/17 0500) SpO2:  [92 %-98 %] 93 % (02/17 0500) Weight:  [59.1 kg] 59.1 kg (02/17 0500) Last BM Date: 10/27/20  Weight change: Filed Weights   10/26/20 0443 10/27/20 0423 10/28/20 0500  Weight: 58.3 kg 59.1 kg 59.1 kg    Intake/Output:   Intake/Output Summary (Last 24 hours) at 10/28/2020 0749 Last data filed at 10/28/2020 0600 Gross per 24 hour  Intake 1412.61 ml  Output 1950 ml  Net -537.39 ml      Physical Exam  CVP 12 General:  No resp difficulty HEENT: normal Neck: supple. JVP 11-12 . Carotids 2+ bilat; no bruits. No lymphadenopathy or thryomegaly appreciated. Cor: PMI nondisplaced. Regular rate & rhythm. No rubs, gallops or murmurs. Tunneled PICC  Lungs: clear Abdomen: soft, nontender, nondistended. No hepatosplenomegaly. No bruits or masses. Good bowel sounds. Extremities: no cyanosis, clubbing, rash, edema Neuro: alert & orientedx3, cranial nerves grossly intact. moves all 4 extremities w/o difficulty. Affect pleasant   Telemetry  Paced with occasional PVCs.   EKG   none  Labs    CBC No results for input(s): WBC, NEUTROABS, HGB, HCT, MCV, PLT in the last 72 hours. Basic Metabolic Panel Recent Labs    10/26/20 0430 10/27/20 0510  NA 134* 136  K 3.7 3.0*  CL 103 101  CO2 21* 25  GLUCOSE 117* 126*  BUN 40* 36*  CREATININE 1.67* 1.45*  CALCIUM 8.2* 7.7*  MG  --  1.8   Liver Function Tests No results for input(s): AST, ALT, ALKPHOS, BILITOT, PROT,  ALBUMIN in the last 72 hours. No results for input(s): LIPASE, AMYLASE in the last 72 hours. Cardiac Enzymes No results for input(s): CKTOTAL, CKMB, CKMBINDEX, TROPONINI in the last 72 hours.  BNP: BNP (last 3 results) Recent Labs    08/02/20 0832 10/22/20 1339  BNP 1,285.0* 1,503.8*    ProBNP (last 3 results) No results for input(s): PROBNP in the last 8760 hours.   D-Dimer No results for input(s): DDIMER in the last 72 hours. Hemoglobin A1C No results for input(s): HGBA1C in the last 72 hours. Fasting Lipid Panel No results for input(s): CHOL, HDL, LDLCALC, TRIG, CHOLHDL, LDLDIRECT in the last 72 hours. Thyroid Function Tests Recent Labs    10/26/20 0430 10/27/20 0510  TSH 10.623*  --   T3FREE  --  1.7*    Other results:   Imaging    No results found.   Medications:     Scheduled Medications: . amiodarone  200 mg Oral Daily  . calcium carbonate  1 tablet Oral TID WC  . Chlorhexidine Gluconate Cloth  6 each Topical Daily  . feeding supplement  237 mL Oral BID BM  . furosemide  80 mg Intravenous BID  . levothyroxine  50 mcg Oral QAC breakfast  . magnesium oxide  200 mg Oral BID  . midodrine  10 mg Oral BID WC  . multivitamin with minerals  1 tablet Oral Daily  .  pantoprazole  40 mg Oral Daily  . Rivaroxaban  15 mg Oral Q supper  . rOPINIRole  0.5 mg Oral Q2000  . sodium chloride flush  10-40 mL Intracatheter Q12H    Infusions: . milrinone 0.5 mcg/kg/min (10/28/20 0320)    PRN Medications: acetaminophen, LORazepam, ondansetron (ZOFRAN) IV, senna-docusate, sodium chloride flush, zolpidem     Assessment/Plan   1. End Stage Heart Failure - ECHO EF 07/2021 EF LV 20-25% RV low normal.  -On Milrinone 0.25 mcg via tunneled PICC - BNP on admit >1500.  - Off diuretics since 2/11. IV fluids stopped 2/14 - CVP 12 today. Continue 80 mg IV lasix twice a day. At home she was taking lasix 40 /20.  -CO-OX improved on milrinone 0.5 mcg.     -  no bb with  low output - Continue midodrine 10 mg twice a day - GDMT limited by hypotension  2. AKI  -Creatinine on admit 2.3. - BMET pending.   -No NSAIDs  3. Hyperkalemia -Resolved.   4. PVCs Continue amio 200 mg daily TSH 11.  Primary team start synthroid.   5.PAF S/P AV nodal ablation/BiV pacing On xarelto  6. Mitral Regurgitation Severe on Echo 12/21  7. DNR    I will go ahead and order home milrinone for d/c 0.54mcg. Needs another day diuresis.    Length of Stay: [place], NP  10/28/2020, 7:49 AM  Advanced Heart Failure Team Pager [phone] (M-F; 7a - 4p)  Please contact [facility] for night-coverage after hours (4p -7a ) and weekends on [email or web address]  Patient seen with NP, agree with the above note.   Feels better today.  CVP 14 on my measure, co-ox 59% (improved) on milrinone 0.5.  Creatinine down to 1.2.     General: NAD Neck: JVP 10 cm, no thyromegaly or thyroid nodule.  Lungs: Clear to auscultation bilaterally with normal respiratory effort. CV: Nondisplaced PMI.  Heart regular S1/S2, no S3/S4, 2/6 HSM apex.  Trace ankle edema.   Abdomen: Soft, nontender, no hepatosplenomegaly, no distention.  Skin: Intact without lesions or rashes.  Neurologic: Alert and oriented x 3.  Psych: Normal affect. Extremities: No clubbing or cyanosis.  HEENT: Normal.   Co-ox improved to 59%, CVP still elevated at 14.   - Continue milrinone 0.5, tolerating higher dose so far.   - Continue Lasix 80 mg IV bid and will give 1 dose of metolazone 2.5 with pm Lasix  - BP stable on current midodrine.   Continue on amiodarone for PVCs.   Continue Xarelto for chronic afib.   Levoxyl started for hypothyroidism.   If she diureses reasonably today and feels better, possibly home tomorrow on higher milrinone dose.   [name] 10/28/2020 1:19 PM

## 2020-10-28 NOTE — Progress Notes (Signed)
Physical Therapy Treatment Patient Details Name: Cheryl Vargas MRN: [id number] DOB: February 11, 1935 Today's Date: 10/28/2020    History of Present Illness Pt is a 86 y.o. female with medical history of nonischemic cardiomyopathy, chronic systolic congestive heart failure with ejection fraction of 20 to 25%, proximal A. fib, hyperlipidemia, hypertension, severe mitral valve regurgitation, CKD stage IIIb, L TKA who presents to the emergency department with complaints of generalized weakness, bilateral lower extremity swelling.  Patient states that she has been more fatigued than usual and also fell few times at home.  She follows with heart failure clinic.  She is on milrinone drip at home. Pt admitted with acute on chronic CHF and AKI. Noted pt with outpt palliative services in the past and plans to restart at discharge.    PT Comments    Pt received in bed, in good spirits. She required supervision bed mobility, min guard assist transfers, and min guard assist ambulation 30' with RW. Pt in recliner with feet elevated at end of session.   Follow Up Recommendations  Home health PT;Supervision for mobility/OOB     Equipment Recommendations  None recommended by PT    Recommendations for Other Services       Precautions / Restrictions Precautions Precautions: Fall Precaution Comments: reports multiple falls    Mobility  Bed Mobility Overal bed mobility: Needs Assistance Bed Mobility: Supine to Sit     Supine to sit: Supervision;HOB elevated     General bed mobility comments: +rail, increased time and effort, supervision for safety    Transfers Overall transfer level: Needs assistance Equipment used: Rolling walker (2 wheeled) Transfers: Sit to/from Stand Sit to Stand: Min guard         General transfer comment: increased time, min guard for safety  Ambulation/Gait Ambulation/Gait assistance: Min guard Gait Distance (Feet): 30 Feet Assistive device: Rolling walker (2  wheeled) Gait Pattern/deviations: Step-through pattern;Decreased stride length Gait velocity: decreased Gait velocity interpretation: <1.31 ft/sec, indicative of household ambulator General Gait Details: Tolerated gait well. Declined further distance due to difficulty managing RW. Pt prefers rollator.   Stairs             Wheelchair Mobility    Modified Rankin (Stroke Patients Only)       Balance Overall balance assessment: Needs assistance Sitting-balance support: No upper extremity supported;Feet supported Sitting balance-Leahy Scale: Good     Standing balance support: Bilateral upper extremity supported;During functional activity;No upper extremity supported Standing balance-Leahy Scale: Fair Standing balance comment: static stand without UE support. RW for amb.                            Cognition Arousal/Alertness: Awake/alert Behavior During Therapy: WFL for tasks assessed/performed Overall Cognitive Status: Within Functional Limits for tasks assessed                                        Exercises      General Comments General comments (skin integrity, edema, etc.): VSS on RA      Pertinent Vitals/Pain Pain Assessment: No/denies pain    Home Living                      Prior Function            PT Goals (current goals can now be found in the care plan  section) Acute Rehab PT Goals Patient Stated Goal: home Progress towards PT goals: Progressing toward goals    Frequency    Min 3X/week      PT Plan Current plan remains appropriate    Co-evaluation              AM-PAC PT "6 Clicks" Mobility   Outcome Measure  Help needed turning from your back to your side while in a flat bed without using bedrails?: None Help needed moving from lying on your back to sitting on the side of a flat bed without using bedrails?: A Little Help needed moving to and from a bed to a chair (including a wheelchair)?: A  Little Help needed standing up from a chair using your arms (e.g., wheelchair or bedside chair)?: A Little Help needed to walk in hospital room?: A Little Help needed climbing 3-5 steps with a railing? : A Little 6 Click Score: 19    End of Session Equipment Utilized During Treatment: Gait belt Activity Tolerance: Patient tolerated treatment well Patient left: in chair;with call bell/phone within reach Nurse Communication: Mobility status PT Visit Diagnosis: Muscle weakness (generalized) (M62.81);Unsteadiness on feet (R26.81);Repeated falls (R29.6)     Time: 5329-9242 PT Time Calculation (min) (ACUTE ONLY): 25 min  Charges:  $Gait Training: 23-37 mins                     Lorrin Goodell, [place]  Office # [phone] Pager [phone]    Lorriane Shire 10/28/2020, 9:05 AM

## 2020-10-28 NOTE — Progress Notes (Signed)
Palliative Medicine RN Note: Rec'd request from PMT NP Gregary Signs to assist with MOST form.  Pt completed a new MOST this admission, which has been scanned to our HIM dept, but she wants to be sure her PCP Dr Laurann Montana gets a copy. I faxed a copy to his office at [phone] for continuation of care.  Marjie Skiff Lesta Limbert, RN, BSN, [facility] Palliative Medicine Team 10/28/2020 12:29 PM Office [phone]

## 2020-10-28 NOTE — Progress Notes (Signed)
[facility] ([facility]) Community Based Palliative Care       This patient has been referred to our palliative care services in the community.  [facility] will continue to follow for any discharge planning needs and to coordinate admission onto palliative care.    Thank you for the opportunity to participate in this patient's care.     Chrislyn King, BSN, RN [facility] Hospital Liaison   [phone] (24h on call) 

## 2020-10-29 ENCOUNTER — Encounter: Payer: PPO | Admitting: Internal Medicine

## 2020-10-29 DIAGNOSIS — I5023 Acute on chronic systolic (congestive) heart failure: Secondary | ICD-10-CM | POA: Diagnosis not present

## 2020-10-29 DIAGNOSIS — I5043 Acute on chronic combined systolic (congestive) and diastolic (congestive) heart failure: Secondary | ICD-10-CM | POA: Diagnosis not present

## 2020-10-29 LAB — BASIC METABOLIC PANEL
Anion gap: 11 (ref 5–15)
BUN: 34 mg/dL — ABNORMAL HIGH (ref 8–23)
CO2: 30 mmol/L (ref 22–32)
Calcium: 8.3 mg/dL — ABNORMAL LOW (ref 8.9–10.3)
Chloride: 92 mmol/L — ABNORMAL LOW (ref 98–111)
Creatinine, Ser: 1.24 mg/dL — ABNORMAL HIGH (ref 0.44–1.00)
GFR, Estimated: 43 mL/min — ABNORMAL LOW (ref >60)
Glucose, Bld: 106 mg/dL — ABNORMAL HIGH (ref 70–99)
Potassium: 3.2 mmol/L — ABNORMAL LOW (ref 3.5–5.1)
Sodium: 133 mmol/L — ABNORMAL LOW (ref 135–145)

## 2020-10-29 LAB — COOXEMETRY PANEL
Carboxyhemoglobin: 1.2 % (ref 0.5–1.5)
Methemoglobin: 0.7 % (ref 0.0–1.5)
O2 Saturation: 47.4 %
Total hemoglobin: 10.2 g/dL — ABNORMAL LOW (ref 12.0–16.0)

## 2020-10-29 MED ORDER — FUROSEMIDE 40 MG PO TABS
40.0000 mg | ORAL_TABLET | Freq: Every evening | ORAL | Status: DC
  Administered 2020-10-29: 40 mg via ORAL
  Filled 2020-10-29: qty 1

## 2020-10-29 MED ORDER — FUROSEMIDE 40 MG PO TABS
60.0000 mg | ORAL_TABLET | Freq: Every day | ORAL | Status: DC
  Filled 2020-10-29: qty 1

## 2020-10-29 MED ORDER — POTASSIUM CHLORIDE CRYS ER 20 MEQ PO TBCR
40.0000 meq | EXTENDED_RELEASE_TABLET | ORAL | Status: AC
  Administered 2020-10-29 (×2): 40 meq via ORAL
  Filled 2020-10-29 (×2): qty 2

## 2020-10-29 NOTE — Plan of Care (Signed)
  Problem: Clinical Measurements: Goal: Respiratory complications will improve Outcome: Progressing   Problem: Activity: Goal: Risk for activity intolerance will decrease Outcome: Progressing   Problem: Elimination: Goal: Will not experience complications related to bowel motility Outcome: Progressing   

## 2020-10-29 NOTE — Progress Notes (Signed)
PROGRESS NOTE    Cheryl Vargas  IWP:[id number] DOB: 1934-11-30 DOA: 10/22/2020 PCP: Lavone Orn, MD   Brief Narrative: 86 year old with past medical history significant for nonischemic cardiomyopathy, chronic systolic congestive heart failure with ejection fraction 20 to 25%, A. fib, hyperlipidemia, hypertension, severe mitral valve regurgitation, CKD stage IIIb, status post total left knee replacement who presents to the emergency department with complaints of generalized weakness, bilateral lower extremity swelling.  Patient started that she has been more fatigued than usual.  She follows with a heart failure clinic.  She is on milrinone drip at home.  She also had 2 pounds weight gain over the last week. On admission patient was found to be AKI on CKD.  Patient was not eating or drinking well, she received gentle hydration and subsequently fluid were stopped. On milrinone infusion and IV lasix.      Assessment & Plan:   Principal Problem:   Acute exacerbation of CHF (congestive heart failure) (HCC) Active Problems:   Paroxysmal atrial fibrillation (HCC)   Non-ischemic cardiomyopathy (HCC)   Acute on chronic combined systolic and diastolic CHF (congestive heart failure) (HCC)   Protein-calorie malnutrition, severe  1-Acute on Chronic Systolic congestive Heart Failure; Presented with bilateral lower extremity edema, weight gain. We will continue with milrinone and she received IV Lasix.  Subsequently changed to oral Lasix. Cardiology is arranging milrinone for home. Would need to be followed by palliative care.  2-Acute on CKD stage IIIb: Prior creatinine 1.2.  Present with a creatinine of 2 range.  Resolved with IV fluids.  Subsequently started on IV Lasix.  Renal function is stable  3-Mild elevation of troponin: Demand ischemia from heart failure exacerbation  4-subclinical hypothyroidism: TSH 10.  She was a started on low-dose thyroxine  Non Ischemic   cardiomyopathy Paroxysmal A. fib: Continue with Xarelto Goals of care: She is on milrinone for palliative purpose.  CODE STATUS DNR.  Cardiology recommended transition to hospice care if her overall condition worsen.    Nutrition Problem: Severe Malnutrition Etiology: chronic illness (CHF)    Signs/Symptoms: severe muscle depletion,severe fat depletion    Interventions: Ensure Enlive (each supplement provides 350kcal and 20 grams of protein),MVI,Magic cup,Education,Carnation Instant Breakfast  Estimated body mass index is 43.62 kg/m as calculated from the following:   Height as of this encounter: 5\' 5"  (1.651 m).   Weight as of this encounter: 118.9 kg.   DVT prophylaxis: Xarelto Code Status: DNR Family Communication: no family at bedside Disposition Plan:  Status is: Inpatient  Remains inpatient appropriate because:IV treatments appropriate due to intensity of illness or inability to take PO   Dispo: The patient is from: Home              Anticipated d/c is to: Home              Anticipated d/c date is: 1 day              Patient currently is not medically stable to d/c.   Difficult to place patient No        Consultants:   Cardiology   Procedures:   none  Antimicrobials:    Subjective: She is feeling tired, denies worsening shortness of breath.  She will not have anybody at home today.  Plan to discharge patient tomorrow morning.  Objective: Vitals:   10/29/20 0443 10/29/20 0445 10/29/20 0924 10/29/20 1458  BP: 95/63   109/63  Pulse: 79   80  Resp: 16  13  Temp: 97.9 F (36.6 C)   97.9 F (36.6 C)  TempSrc: Oral   Oral  SpO2:      Weight:  53.8 kg 118.9 kg   Height:        Intake/Output Summary (Last 24 hours) at 10/29/2020 1635 Last data filed at 10/29/2020 1454 Gross per 24 hour  Intake 270 ml  Output 1900 ml  Net -1630 ml   Filed Weights   10/28/20 0500 10/29/20 0445 10/29/20 0924  Weight: 59.1 kg 53.8 kg 118.9 kg     Examination:  General exam: Appears calm and comfortable  Respiratory system: BL  crackles Cardiovascular system: S1 & S2 heard, RRR. No JVD, murmurs, rubs, gallops or clicks. No pedal edema. Gastrointestinal system: Abdomen is nondistended, soft and nontender. No organomegaly or masses felt. Normal bowel sounds heard. Central nervous system: Alert and oriented. Extremities:  No edema  Data Reviewed: I have personally reviewed following labs and imaging studies  CBC: Recent Labs  Lab 10/23/20 0317  WBC 5.8  HGB 10.6*  HCT 32.4*  MCV 83.1  PLT 884   Basic Metabolic Panel: Recent Labs  Lab 10/23/20 0317 10/24/20 0259 10/25/20 0632 10/26/20 0430 10/27/20 0510 10/28/20 0500 10/29/20 0137  NA 134*   < > 133* 134* 136 136 133*  K 3.8   < > 5.3* 3.7 3.0* 3.7 3.2*  CL 101   < > 106 103 101 98 92*  CO2 22   < > 19* 21* 25 28 30   GLUCOSE 111*   < > 115* 117* 126* 95 106*  BUN 48*   < > 42* 40* 36* 30* 34*  CREATININE 1.95*   < > 1.55* 1.67* 1.45* 1.21* 1.24*  CALCIUM 8.8*   < > 8.4* 8.2* 7.7* 7.9* 8.3*  MG 2.4  --   --   --  1.8 2.0  --    < > = values in this interval not displayed.   GFR: Estimated Creatinine Clearance: 44.4 mL/min (A) (by C-G formula based on SCr of 1.24 mg/dL (H)). Liver Function Tests: No results for input(s): AST, ALT, ALKPHOS, BILITOT, PROT, ALBUMIN in the last 168 hours. No results for input(s): LIPASE, AMYLASE in the last 168 hours. No results for input(s): AMMONIA in the last 168 hours. Coagulation Profile: No results for input(s): INR, PROTIME in the last 168 hours. Cardiac Enzymes: No results for input(s): CKTOTAL, CKMB, CKMBINDEX, TROPONINI in the last 168 hours. BNP (last 3 results) No results for input(s): PROBNP in the last 8760 hours. HbA1C: No results for input(s): HGBA1C in the last 72 hours. CBG: No results for input(s): GLUCAP in the last 168 hours. Lipid Profile: No results for input(s): CHOL, HDL, LDLCALC, TRIG, CHOLHDL,  LDLDIRECT in the last 72 hours. Thyroid Function Tests: Recent Labs    10/27/20 0510  FREET4 1.04  T3FREE 1.7*   Anemia Panel: No results for input(s): VITAMINB12, FOLATE, FERRITIN, TIBC, IRON, RETICCTPCT in the last 72 hours. Sepsis Labs: No results for input(s): PROCALCITON, LATICACIDVEN in the last 168 hours.  Recent Results (from the past 240 hour(s))  Culture, blood (Routine x 2)     Status: None   Collection Time: 10/22/20 12:27 PM   Specimen: BLOOD  Result Value Ref Range Status   Specimen Description BLOOD RIGHT ANTECUBITAL  Final   Special Requests   Final    BOTTLES DRAWN AEROBIC AND ANAEROBIC Blood Culture adequate volume   Culture   Final    NO GROWTH  5 DAYS Performed at [facility], [place] [address], [place], [place] [address]    Report Status 10/27/2020 FINAL  Final  Resp Panel by RT-PCR (Flu A&B, Covid) Nasopharyngeal Swab     Status: None   Collection Time: 10/22/20  2:26 PM   Specimen: Nasopharyngeal Swab; Nasopharyngeal(NP) swabs in vial transport medium  Result Value Ref Range Status   SARS Coronavirus 2 by RT PCR NEGATIVE NEGATIVE Final    Comment: (NOTE) SARS-CoV-2 target nucleic acids are NOT DETECTED.  The SARS-CoV-2 RNA is generally detectable in upper respiratory specimens during the acute phase of infection. The lowest concentration of SARS-CoV-2 viral copies this assay can detect is 138 copies/mL. A negative result does not preclude SARS-Cov-2 infection and should not be used as the sole basis for treatment or other patient management decisions. A negative result may occur with  improper specimen collection/handling, submission of specimen other than nasopharyngeal swab, presence of viral mutation(s) within the areas targeted by this assay, and inadequate number of viral copies(<138 copies/mL). A negative result must be combined with clinical observations, patient history, and epidemiological information. The expected result is  Negative.  Fact Sheet for Patients:  EntrepreneurPulse.com.au  Fact Sheet for Healthcare Providers:  IncredibleEmployment.be  This test is no t yet approved or cleared by the [place] FDA and  has been authorized for detection and/or diagnosis of SARS-CoV-2 by FDA under an Emergency Use Authorization (EUA). This EUA will remain  in effect (meaning this test can be used) for the duration of the COVID-19 declaration under Section 564(b)(1) of the Act, 21 U.S.C.section 360bbb-3(b)(1), unless the authorization is terminated  or revoked sooner.       Influenza A by PCR NEGATIVE NEGATIVE Final   Influenza B by PCR NEGATIVE NEGATIVE Final    Comment: (NOTE) The Xpert Xpress SARS-CoV-2/FLU/RSV plus assay is intended as an aid in the diagnosis of influenza from Nasopharyngeal swab specimens and should not be used as a sole basis for treatment. Nasal washings and aspirates are unacceptable for Xpert Xpress SARS-CoV-2/FLU/RSV testing.  Fact Sheet for Patients: EntrepreneurPulse.com.au  Fact Sheet for Healthcare Providers: IncredibleEmployment.be  This test is not yet approved or cleared by the [place] FDA and has been authorized for detection and/or diagnosis of SARS-CoV-2 by FDA under an Emergency Use Authorization (EUA). This EUA will remain in effect (meaning this test can be used) for the duration of the COVID-19 declaration under Section 564(b)(1) of the Act, 21 U.S.C. section 360bbb-3(b)(1), unless the authorization is terminated or revoked.  Performed at [facility], [place] [address] [place], [place] [address]          Radiology Studies: No results found.      Scheduled Meds: . amiodarone  200 mg Oral Daily  . calcium carbonate  1 tablet Oral TID WC  . Chlorhexidine Gluconate Cloth  6 each Topical Daily  . feeding supplement  237 mL Oral BID BM  . furosemide  40 mg Oral QPM   . [START ON 10/30/2020] furosemide  60 mg Oral Q breakfast  . levothyroxine  50 mcg Oral QAC breakfast  . magnesium oxide  200 mg Oral BID  . midodrine  10 mg Oral BID WC  . multivitamin with minerals  1 tablet Oral Daily  . pantoprazole  40 mg Oral Daily  . Rivaroxaban  15 mg Oral Q supper  . rOPINIRole  0.5 mg Oral Q2000  . sodium chloride flush  10-40 mL Intracatheter Q12H  Continuous Infusions: . milrinone 0.5 mcg/kg/min (10/29/20 0837)     LOS: 7 days    Time spent: 35 minutes    Roshini Fulwider A Hanalei Glace, MD [facility]   If 7PM-7AM, please contact night-coverage [email or web address]  10/29/2020, 4:35 PM

## 2020-10-29 NOTE — Progress Notes (Signed)
Patient ID: Cheryl Vargas, female   DOB: December 14, 1934, 86 y.o.   MRN: [id number]     Advanced Heart Failure Rounding Note  PCP-Cardiologist: [name], MD   Subjective:    2/14 milrinone increased to 0.375 mcg  2/16  Milrinone increased to 0.5 mcg.   Early am co-ox 48%.  Feels tired again this morning but slept poorly last night. Creatinine stable 1.24.  I/Os negative, weight down.  CVP 8 today.    Objective:   Weight Range: 118.9 kg Body mass index is 43.62 kg/m.   Vital Signs:   Temp:  [97.3 F (36.3 C)-97.9 F (36.6 C)] 97.9 F (36.6 C) (02/18 0443) Pulse Rate:  [79-80] 79 (02/18 0443) Resp:  [15-16] 16 (02/18 0443) BP: (95-114)/(61-63) 95/63 (02/18 0443) SpO2:  [96 %-97 %] 97 % (02/17 2029) Weight:  [53.8 kg-118.9 kg] 118.9 kg (02/18 0924) Last BM Date: 10/27/20  Weight change: Filed Weights   10/28/20 0500 10/29/20 0445 10/29/20 0924  Weight: 59.1 kg 53.8 kg 118.9 kg    Intake/Output:   Intake/Output Summary (Last 24 hours) at 10/29/2020 0937 Last data filed at 10/29/2020 0924 Gross per 24 hour  Intake 270 ml  Output 1950 ml  Net -1680 ml      Physical Exam  CVP 8 General: NAD Neck: JVP 8-9 cm, no thyromegaly or thyroid nodule.  Lungs: Clear to auscultation bilaterally with normal respiratory effort. CV: Lateral PMI.  Heart regular S1/S2, no S3/S4, 2/6 HSM apex.  Trace ankle edema.   Abdomen: Soft, nontender, no hepatosplenomegaly, no distention.  Skin: Intact without lesions or rashes.  Neurologic: Alert and oriented x 3.  Psych: Normal affect. Extremities: No clubbing or cyanosis.  HEENT: Normal.    Telemetry   Atrial fibrillation with BiV pacing (personally reviewed).   EKG   none  Labs    CBC No results for input(s): WBC, NEUTROABS, HGB, HCT, MCV, PLT in the last 72 hours. Basic Metabolic Panel Recent Labs    10/27/20 0510 10/28/20 0500 10/29/20 0137  NA 136 136 133*  K 3.0* 3.7 3.2*  CL 101 98 92*  CO2 25 28 30   GLUCOSE 126*  95 106*  BUN 36* 30* 34*  CREATININE 1.45* 1.21* 1.24*  CALCIUM 7.7* 7.9* 8.3*  MG 1.8 2.0  --    Liver Function Tests No results for input(s): AST, ALT, ALKPHOS, BILITOT, PROT, ALBUMIN in the last 72 hours. No results for input(s): LIPASE, AMYLASE in the last 72 hours. Cardiac Enzymes No results for input(s): CKTOTAL, CKMB, CKMBINDEX, TROPONINI in the last 72 hours.  BNP: BNP (last 3 results) Recent Labs    08/02/20 0832 10/22/20 1339  BNP 1,285.0* 1,503.8*    ProBNP (last 3 results) No results for input(s): PROBNP in the last 8760 hours.   D-Dimer No results for input(s): DDIMER in the last 72 hours. Hemoglobin A1C No results for input(s): HGBA1C in the last 72 hours. Fasting Lipid Panel No results for input(s): CHOL, HDL, LDLCALC, TRIG, CHOLHDL, LDLDIRECT in the last 72 hours. Thyroid Function Tests Recent Labs    10/27/20 0510  T3FREE 1.7*    Other results:   Imaging    No results found.   Medications:     Scheduled Medications: . amiodarone  200 mg Oral Daily  . calcium carbonate  1 tablet Oral TID WC  . Chlorhexidine Gluconate Cloth  6 each Topical Daily  . feeding supplement  237 mL Oral BID BM  . furosemide  40  mg Oral QPM  . [START ON 10/30/2020] furosemide  60 mg Oral Q breakfast  . levothyroxine  50 mcg Oral QAC breakfast  . magnesium oxide  200 mg Oral BID  . midodrine  10 mg Oral BID WC  . multivitamin with minerals  1 tablet Oral Daily  . pantoprazole  40 mg Oral Daily  . potassium chloride  40 mEq Oral Q2H  . Rivaroxaban  15 mg Oral Q supper  . rOPINIRole  0.5 mg Oral Q2000  . sodium chloride flush  10-40 mL Intracatheter Q12H    Infusions: . milrinone 0.5 mcg/kg/min (10/29/20 0837)    PRN Medications: acetaminophen, LORazepam, ondansetron (ZOFRAN) IV, senna-docusate, sodium chloride flush, zolpidem     Assessment/Plan   1. Acute on chronic systolic HF: [place] 12/979 showed no significant coronary disease. Cardiac MRI  10/17 EF 36%, diffuse HK, normal RV, biatrial enlargement, moderate MR, non-coronary LGE pattern involving the mid-wall of the basal to mid septum and inferior wall. Possibly prior myocarditis versus a form of infiltrative disease. [employer] CRT-D s/p AV nodal ablation. Echo in 1/21 showed EF <20% with moderate LV dilation and mildly decreased RV systolic function. Echo in 12/21 with EF 20-25%, severe MR, severe TR, moderate AI.Low output on 1/21 RHC (CI 1.72) and 12/21 RHC (CI 1.8). NYHA class IIIb symptoms. End stage HF now with home milrinone.Admitted with failure to thrive, fatigue, AKI, persistent nausea.  I think that these symptoms signify worsening of her HF.  No evidence for UTI or other infection. We have increased milrinone to 0.5 mcg/kg/min and have diuresed with IV Lasix.  CVP 8 this morning. - Continue milrinone 0.5 at home, this is maximum dose.  - Stop IV Lasix, start Lasix 60 qam/40 qpm today (was on 40 qam/20 qpm at home).  - She is off losartan, bisoprololand spironolactone with symptomatic hypotension and now is on midodrine 10 mg bid. - Off digoxin with elevated level. - Low output on 1/21and 12/21RHCs. Given her age and frailty,poor candidate for LVAD.End stage CHF now with marked fatigue. She has progressive heart failure. She is followed by palliative care and is DNR, reasonable to move onto full hospice ([facility] to allow continued milrinone) when she is ready.  Palliative care service following here.   2. Atrial fibrillation: Chronic, s/p AV nodal ablation and BiV pacing.  - Continue Xarelto 15 mg daily.  3. PVCs/VT: Frequent PVCs have limited BiV pacing. Not a good PVC ablation candidate per EP. Had VT in 1/21. Cannot tolerate sotalol orTikosyn. Did not tolerate mexilitine with nausea. Had nausea also with ranolazine. She had nausea in the past with amiodarone but seems to be tolerating it now. -Continue amiodarone 200 mg daily for PVC suppression to try to  promote BiV pacing. 4. Mitral regurgitation:Severe on 12/21 echo, not candidate for intervention. 5. Hypothyroidism: Started on Levoxyl by primary service.   I think we have her as optimized as we can get her in the setting of end-stage HF.  She will need milrinone 0.5 at home, so will try to arrange home health to get her home today (otherwise may not be able to get her out until Monday). Would continue to have her followed by palliative care services at home, transition to [facility] hospice (takes milrinone) when ready.  From my standpoint, she could go home today.  Cardiac meds for discharge: KCl 20 daily, Lasix 60 qam/40 qpm, milrinone 0.5 (increased dose), midodrine 10 mg tid, amiodarone 200 daily, Xarelto 15 mg daily.  [name] 10/29/2020 9:37 AM

## 2020-10-29 NOTE — TOC Progression Note (Signed)
Transition of Care [facility]) - Progression Note    Patient Details  Name: Cheryl Vargas MRN: [id number] Date of Birth: 18-Nov-1934  [facility] CM/SW Contact  Graves-Bigelow, Ocie Cornfield, RN Phone Number: 10/29/2020, 2:10 PM  Clinical Narrative:  Case Manager spoke with son [name] regarding disposition date. Plan will be to transition home in the am. [name] with [employer] will connect the patient to Milrinone in the morning at 7:00 am and the son will transport home via private vehicle by 9:30 am. MD aware of plan and hopefully d/c orders will be in place. [facility] notified of transition home in am. Start of care to begin within 24-48 hours post transition home. [place] will continue to follow for palliative needs. Son states the patient is home alone; however he has hired some assistance via home instead to assist. Transitions of Care Team to continue to follow for additional needs.   Expected Discharge Plan: [place] Barriers to Discharge: Continued Medical Work up  Expected Discharge Plan and Services Expected Discharge Plan: [place] In-house Referral: Hospice / Palliative Care Discharge Planning Services: CM Consult Post Acute Care Choice: Home Health,Resumption of Svcs/PTA Provider Living arrangements for the past 2 months: Apartment (condo)                 DME Arranged: N/A         HH Arranged: RN,Disease Management,PT,OT,Nurse's Aide [place]: [facility] Date [place]: 10/26/20 Time [place]: [place] Representative spoke with at [place]: [name]  Readmission Risk Interventions Readmission Risk Prevention Plan 10/26/2020  Transportation Screening Complete  PCP or Specialist Appt within 3-5 Days Complete  HRI or [place] Complete  Social Work Consult for [place] Planning/Counseling Complete  Palliative Care Screening Complete  Medication Review Press photographer)  Complete  Some recent data might be hidden

## 2020-10-29 NOTE — Progress Notes (Signed)
Occupational Therapy Treatment Patient Details Name: Cheryl Vargas MRN: [id number] DOB: Mar 27, 1935 Today's Date: 10/29/2020    History of present illness Pt is a 86 y.o. female with medical history of nonischemic cardiomyopathy, chronic systolic congestive heart failure with ejection fraction of 20 to 25%, proximal A. fib, hyperlipidemia, hypertension, severe mitral valve regurgitation, CKD stage IIIb, L TKA who presents to the emergency department with complaints of generalized weakness, bilateral lower extremity swelling.  Patient states that she has been more fatigued than usual and also fell few times at home.  She follows with heart failure clinic.  She is on milrinone drip at home. Pt admitted with acute on chronic CHF and AKI. Noted pt with outpt palliative services in the past and plans to restart at discharge.   OT comments  Pt. Seen for skilled OT session. Focus on LB dressing which pt. Performed with set up bed level and energy conservation strategies.  Pt. Able to verbalize current strategies in place.  I also reviewed additional examples.  Pt. Reports a close friend that lives in her [place]hood that provides a lot of support and assistance, also states she will be getting assistance with meals upon d/c home.    Follow Up Recommendations  Home health OT;Supervision/Assistance - 24 hour    Equipment Recommendations  None recommended by OT    Recommendations for Other Services      Precautions / Restrictions Precautions Precautions: Fall Precaution Comments: reports multiple falls       Mobility Bed Mobility               General bed mobility comments: in bed, declines eob/oob reporting multiple tripst to bsc has worn her out  Transfers                      Balance                                           ADL either performed or assessed with clinical judgement   ADL Overall ADL's : Needs assistance/impaired                      Lower Body Dressing: Set up;Bed level Lower Body Dressing Details (indicate cue type and reason): able to don underwear supine/long sitting to reach b les and pull over hips               General ADL Comments: pt. declined oob, reports multiple transfers to/from bsc and is feeling weak.  requesting pure wik. gateway into education on energy conversation strategies while reviewing she had demonstrated energy conservation and safety by requesting purewik when she was feeling fatigue to prevent falls.  pt. also able to provide additional examples of utilization of energy conservation ie: reports chairs set up in various locations to aide in allowing rest breaks whe she feels sob.  also discussed she now sponge bathes as it is easier and wears her out less.  reviewed apt. times not too early or late when whe will likely be tired.  pt. report she will also be getting assistance with meals on wheels upon return home     Vision       Perception     Praxis      Cognition Arousal/Alertness: Awake/alert Behavior During Therapy: WFL for tasks assessed/performed Overall Cognitive Status: Within Functional Limits  for tasks assessed                                 General Comments: aware of her condition and prognosis, speaking openly about how she feels about it. states "ive been told 6 months or a week or days" appears to have good grasp of things and wanting to take it "one day at a time".  when discussing her heart issues and eating pot pies and other frozen meals i had talked mentioned options with less sodium and she stated "im at the point where i think im about to start being able to eat whatever i want" (in regards to her prognosis and timeframes).  provided emotional support and let her talk freely about her feelings        Exercises     Shoulder Instructions       General Comments      Pertinent Vitals/ Pain       Pain Assessment: No/denies pain  Home Living                                           Prior Functioning/Environment              Frequency  Min 2X/week        Progress Toward Goals  OT Goals(current goals can now be found in the care plan section)  Progress towards OT goals: Progressing toward goals     Plan Discharge plan remains appropriate    Co-evaluation                 AM-PAC OT "6 Clicks" Daily Activity     Outcome Measure   Help from another person eating meals?: None Help from another person taking care of personal grooming?: A Little Help from another person toileting, which includes using toliet, bedpan, or urinal?: A Little Help from another person bathing (including washing, rinsing, drying)?: A Lot Help from another person to put on and taking off regular upper body clothing?: A Little Help from another person to put on and taking off regular lower body clothing?: A Lot 6 Click Score: 17    End of Session    OT Visit Diagnosis: Unsteadiness on feet (R26.81);Other abnormalities of gait and mobility (R26.89);Muscle weakness (generalized) (M62.81);History of falling (Z91.81)   Activity Tolerance Patient tolerated treatment well   Patient Left in bed;with call bell/phone within reach   Nurse Communication Other (comment) (notified pts. CNA that i had placed purewik per pt. request)        Time: 3235-5732 OT Time Calculation (min): 21 min  Charges: OT General Charges $OT Visit: 1 Visit OT Treatments $Self Care/Home Management : 8-22 mins  Cheryl Vargas, COTA/L Acute Rehabilitation [phone]   Cheryl Vargas 10/29/2020, 11:30 AM

## 2020-10-29 NOTE — Care Management Important Message (Signed)
Important Message  Patient Details  Name: NEIRA BENTSEN MRN: [id number] Date of Birth: 22-May-1935   Medicare Important Message Given:  Yes     [name] 10/29/2020, 9:21 AM

## 2020-10-30 ENCOUNTER — Inpatient Hospital Stay (HOSPITAL_COMMUNITY): Payer: PPO

## 2020-10-30 DIAGNOSIS — I5043 Acute on chronic combined systolic (congestive) and diastolic (congestive) heart failure: Secondary | ICD-10-CM | POA: Diagnosis not present

## 2020-10-30 DIAGNOSIS — I5023 Acute on chronic systolic (congestive) heart failure: Secondary | ICD-10-CM | POA: Diagnosis not present

## 2020-10-30 DIAGNOSIS — I428 Other cardiomyopathies: Secondary | ICD-10-CM | POA: Diagnosis not present

## 2020-10-30 LAB — BASIC METABOLIC PANEL
Anion gap: 12 (ref 5–15)
Anion gap: 11 (ref 5–15)
BUN: 37 mg/dL — ABNORMAL HIGH (ref 8–23)
BUN: 34 mg/dL — ABNORMAL HIGH (ref 8–23)
CO2: 32 mmol/L (ref 22–32)
CO2: 33 mmol/L — ABNORMAL HIGH (ref 22–32)
Calcium: 8.5 mg/dL — ABNORMAL LOW (ref 8.9–10.3)
Calcium: 8.9 mg/dL (ref 8.9–10.3)
Chloride: 87 mmol/L — ABNORMAL LOW (ref 98–111)
Chloride: 87 mmol/L — ABNORMAL LOW (ref 98–111)
Creatinine, Ser: 1.3 mg/dL — ABNORMAL HIGH (ref 0.44–1.00)
Creatinine, Ser: 1.27 mg/dL — ABNORMAL HIGH (ref 0.44–1.00)
GFR, Estimated: 41 mL/min — ABNORMAL LOW (ref >60)
GFR, Estimated: 42 mL/min — ABNORMAL LOW (ref >60)
Glucose, Bld: 150 mg/dL — ABNORMAL HIGH (ref 70–99)
Glucose, Bld: 128 mg/dL — ABNORMAL HIGH (ref 70–99)
Potassium: 2.8 mmol/L — ABNORMAL LOW (ref 3.5–5.1)
Potassium: 3.8 mmol/L (ref 3.5–5.1)
Sodium: 131 mmol/L — ABNORMAL LOW (ref 135–145)
Sodium: 131 mmol/L — ABNORMAL LOW (ref 135–145)

## 2020-10-30 LAB — COOXEMETRY PANEL
Carboxyhemoglobin: 1.6 % — ABNORMAL HIGH (ref 0.5–1.5)
Methemoglobin: 1 % (ref 0.0–1.5)
O2 Saturation: 61.9 %
Total hemoglobin: 11 g/dL — ABNORMAL LOW (ref 12.0–16.0)

## 2020-10-30 MED ORDER — MAGNESIUM OXIDE 400 MG PO TABS: 200.0000 mg | ORAL_TABLET | Freq: Two times a day (BID) | ORAL | 0 refills | Status: AC

## 2020-10-30 MED ORDER — ZOLPIDEM TARTRATE 5 MG PO TABS: 5.0000 mg | ORAL_TABLET | Freq: Every day | ORAL | 0 refills | Status: DC

## 2020-10-30 MED ORDER — MILRINONE LACTATE IN DEXTROSE 20-5 MG/100ML-% IV SOLN: 0.5000 ug/kg/min | INTRAVENOUS | 0 refills | Status: AC

## 2020-10-30 MED ORDER — FLEET ENEMA 7-19 GM/118ML RE ENEM
1.0000 | ENEMA | Freq: Once | RECTAL | Status: AC
  Administered 2020-10-30: 1 via RECTAL
  Filled 2020-10-30: qty 1

## 2020-10-30 MED ORDER — AMIODARONE HCL 200 MG PO TABS: 200.0000 mg | ORAL_TABLET | Freq: Every day | ORAL | 6 refills | Status: DC

## 2020-10-30 MED ORDER — BISACODYL 10 MG RE SUPP
10.0000 mg | Freq: Once | RECTAL | Status: AC
  Administered 2020-10-30: 10 mg via RECTAL
  Filled 2020-10-30: qty 1

## 2020-10-30 MED ORDER — ONDANSETRON HCL 4 MG PO TABS: 4.0000 mg | ORAL_TABLET | Freq: Two times a day (BID) | ORAL | 1 refills | Status: AC | PRN

## 2020-10-30 MED ORDER — SENNOSIDES-DOCUSATE SODIUM 8.6-50 MG PO TABS
1.0000 | ORAL_TABLET | Freq: Two times a day (BID) | ORAL | Status: DC
  Administered 2020-10-30: 1 via ORAL
  Filled 2020-10-30: qty 1

## 2020-10-30 MED ORDER — FUROSEMIDE 40 MG PO TABS: 40.0000 mg | ORAL_TABLET | Freq: Every evening | ORAL | 2 refills | Status: DC

## 2020-10-30 MED ORDER — POTASSIUM CHLORIDE CRYS ER 20 MEQ PO TBCR
40.0000 meq | EXTENDED_RELEASE_TABLET | Freq: Once | ORAL | Status: AC
  Administered 2020-10-30: 40 meq via ORAL
  Filled 2020-10-30: qty 2

## 2020-10-30 MED ORDER — POLYETHYLENE GLYCOL 3350 17 G PO PACK: 17.0000 g | PACK | Freq: Two times a day (BID) | ORAL | 0 refills | Status: AC

## 2020-10-30 MED ORDER — MORPHINE SULFATE (PF) 4 MG/ML IV SOLN
4.0000 mg | Freq: Once | INTRAVENOUS | Status: AC
  Administered 2020-10-30: 4 mg via INTRAVENOUS
  Filled 2020-10-30: qty 1

## 2020-10-30 MED ORDER — MIDODRINE HCL 10 MG PO TABS: 10.0000 mg | ORAL_TABLET | Freq: Two times a day (BID) | ORAL | 2 refills | Status: AC

## 2020-10-30 MED ORDER — LEVOTHYROXINE SODIUM 50 MCG PO TABS: 50.0000 ug | ORAL_TABLET | Freq: Every day | ORAL | 0 refills | Status: AC

## 2020-10-30 MED ORDER — MORPHINE SULFATE (PF) 2 MG/ML IV SOLN
1.0000 mg | INTRAVENOUS | Status: DC | PRN
  Administered 2020-10-30: 1 mg via INTRAVENOUS
  Filled 2020-10-30: qty 1

## 2020-10-30 MED ORDER — FUROSEMIDE 20 MG PO TABS: 60.0000 mg | ORAL_TABLET | Freq: Every day | ORAL | 2 refills | Status: DC

## 2020-10-30 MED ORDER — POLYETHYLENE GLYCOL 3350 17 G PO PACK: 17.0000 g | PACK | Freq: Two times a day (BID) | ORAL | Status: DC

## 2020-10-30 MED ORDER — POTASSIUM CHLORIDE 10 MEQ/100ML IV SOLN
10.0000 meq | INTRAVENOUS | Status: AC
  Administered 2020-10-30: 10 meq via INTRAVENOUS
  Filled 2020-10-30: qty 100

## 2020-10-30 MED ORDER — POTASSIUM CHLORIDE CRYS ER 20 MEQ PO TBCR: 40.0000 meq | EXTENDED_RELEASE_TABLET | Freq: Two times a day (BID) | ORAL | 3 refills | Status: DC

## 2020-10-30 MED ORDER — POTASSIUM CHLORIDE CRYS ER 20 MEQ PO TBCR: 40.0000 meq | EXTENDED_RELEASE_TABLET | Freq: Two times a day (BID) | ORAL | Status: DC

## 2020-10-30 MED ORDER — LORAZEPAM 0.5 MG PO TABS: 0.5000 mg | ORAL_TABLET | Freq: Three times a day (TID) | ORAL | 0 refills | Status: AC | PRN

## 2020-10-30 MED ORDER — HYDROCORTISONE 1 % EX CREA
TOPICAL_CREAM | Freq: Two times a day (BID) | CUTANEOUS | Status: DC
  Filled 2020-10-30: qty 28

## 2020-10-30 MED ORDER — ROPINIROLE HCL 0.5 MG PO TABS: 0.5000 mg | ORAL_TABLET | Freq: Every day | ORAL | 0 refills | Status: AC

## 2020-10-30 NOTE — Progress Notes (Signed)
Pt.had small hard stool & c/o that  there's still hard stool on her rectum. Gave her prune juice but it didn't work.MD on call was called & ordered dulcolax suppository & was given.

## 2020-10-30 NOTE — Progress Notes (Signed)
Pt.still miserable & now c/o pain on her rectum & dulcolax supp.didnt  Work..MD on call was called again & made her aware & ordered Morphine 4 mg IV & soap sud enema.& abd.Xray. Soap sud enema given but some came out & still no results yet .Will endorse to next shift nurse.

## 2020-10-30 NOTE — Progress Notes (Signed)
Patient was disimpacted by myself and the nursing student Leia manually at 1015, 5 small round hard clumps of stool were removed and more was felt in the rectum. Pt asked for a break and we will continue in 30 minutes to remove the rest.

## 2020-10-30 NOTE — Discharge Summary (Signed)
Physician Discharge Summary  Cheryl Vargas TDD:[id number] DOB: 1934/11/25 DOA: 10/22/2020  PCP: Lavone Orn, MD  Admit date: 10/22/2020 Discharge date: 10/30/2020  Admitted From: Home  Disposition:  Home   Recommendations for Outpatient Follow-up:  1. Follow up with PCP in 1-2 weeks 2. Please obtain BMP/CBC in one week 3. Needs to be follow up by Palliative 4.     Discharge Condition: stable.  CODE STATUS: DNR Diet recommendation: Heart Healthy  Brief/Interim Summary: 86 year old with past medical history significant for nonischemic cardiomyopathy, chronic systolic congestive heart failure with ejection fraction 20 to 25%, A. fib, hyperlipidemia, hypertension, severe mitral valve regurgitation, CKD stage IIIb, status post total left knee replacement who presents to the emergency department with complaints of generalized weakness, bilateral lower extremity swelling.  Patient started that she has been more fatigued than usual.  She follows with a heart failure clinic.  She is on milrinone drip at home.  She also had 2 pounds weight gain over the last week. On admission patient was found to be AKI on CKD.  Patient was not eating or drinking well, she received gentle hydration and subsequently fluid were stopped. On milrinone infusion and IV lasix.     1-Acute on Chronic Systolic congestive Heart Failure; Presented with bilateral lower extremity edema, weight gain. We will continue with milrinone and she received IV Lasix.  Subsequently changed to oral Lasix. Cardiology is arranging milrinone for home. Would need to be followed by palliative care. If her condition deteriorate cardiology recommend hospice care.  Plan to discharge on oral lasix, potasium.   2-Acute on CKD stage IIIb: Prior creatinine 1.2.  Present with a creatinine of 2 range.  Resolved with IV fluids.  Subsequently started on IV Lasix.  Renal function is stable  3-Mild elevation of troponin: Demand ischemia from  heart failure exacerbation  4-subclinical hypothyroidism: TSH 10.  She was a started on low-dose thyroxine  Non Ischemic  cardiomyopathy Paroxysmal A. fib: Continue with Xarelto Goals of care: She is on milrinone for palliative purpose.  CODE STATUS DNR.  Cardiology recommended transition to hospice care if her overall condition worsen.  Constipation; received multiples enema. She was disimpacted manually. Had BM Plan to discharge on Miralax BID, after few days can use it daily. Senakot PRN  Hypokalemia; replete orally.     Discharge Diagnoses:  Principal Problem:   Acute exacerbation of CHF (congestive heart failure) (HCC) Active Problems:   Paroxysmal atrial fibrillation (HCC)   Non-ischemic cardiomyopathy (HCC)   Acute on chronic combined systolic and diastolic CHF (congestive heart failure) (HCC)   Protein-calorie malnutrition, severe    Discharge Instructions  Discharge Instructions    Diet - low sodium heart healthy   Complete by: As directed    Increase activity slowly   Complete by: As directed      Allergies as of 10/30/2020      Reactions   Sotalol Other (See Comments)   Prolonged QTc   Alendronate Sodium Other (See Comments)   Arm pain   Compazine [prochlorperazine] Other (See Comments)   Nervous reaction   Dofetilide Nausea Only   Reported by [facility] physicians   Lisinopril Other (See Comments)   Low bp   Zolpidem Tartrate Er Other (See Comments)   Cannot take extended release - makes her too tired the next day   Ciprofloxacin Other (See Comments)   Not effective   Coreg [carvedilol] Other (See Comments)   Fatigue   Metoprolol Other (See Comments)  Profound fatigue      Medication List    STOP taking these medications   ciprofloxacin 250 MG tablet Commonly known as: Cipro   Magnesium Oxide 200 MG Tabs     TAKE these medications   acetaminophen 500 MG tablet Commonly known as: TYLENOL Take 500-1,000 mg by mouth every 6 (six) hours as  needed for headache (pain).   amiodarone 200 MG tablet Commonly known as: PACERONE Take 1 tablet (200 mg total) by mouth daily.   fluticasone 50 MCG/ACT nasal spray Commonly known as: FLONASE Place 1 spray into both nostrils daily as needed for allergies or rhinitis (seasonal allergies).   furosemide 20 MG tablet Commonly known as: LASIX Take 3 tablets (60 mg total) by mouth daily with breakfast. What changed:   medication strength  how much to take  when to take this   furosemide 40 MG tablet Commonly known as: LASIX Take 1 tablet (40 mg total) by mouth every evening. What changed: You were already taking a medication with the same name, and this prescription was added. Make sure you understand how and when to take each.   levothyroxine 50 MCG tablet Commonly known as: SYNTHROID Take 1 tablet (50 mcg total) by mouth daily before breakfast. Start taking on: October 31, 2020   LORazepam 0.5 MG tablet Commonly known as: ATIVAN Take 1 tablet (0.5 mg total) by mouth every 8 (eight) hours as needed for anxiety (anxiety,SOB). What changed:   when to take this  reasons to take this   magnesium oxide 400 MG tablet Commonly known as: MAG-OX Take 0.5 tablets (200 mg total) by mouth 2 (two) times daily.   midodrine 10 MG tablet Commonly known as: PROAMATINE Take 1 tablet (10 mg total) by mouth 2 (two) times daily with a meal.   milrinone 20 MG/100 ML Soln infusion Commonly known as: PRIMACOR Inject 0.0296 mg/min into the vein continuous for 2 days. What changed:   how much to take  additional instructions  how fast to infuse this   ondansetron 4 MG tablet Commonly known as: Zofran Take 1 tablet (4 mg total) by mouth 2 (two) times daily as needed for nausea or vomiting.   pantoprazole 40 MG tablet Commonly known as: PROTONIX Take 1 tablet (40 mg total) by mouth daily.   polyethylene glycol 17 g packet Commonly known as: MIRALAX / GLYCOLAX Take 17 g by mouth 2  (two) times daily.   potassium chloride SA 20 MEQ tablet Commonly known as: KLOR-CON Take 2 tablets (40 mEq total) by mouth 2 (two) times daily. Start taking on: October 31, 2020 What changed:   how much to take  when to take this   Rivaroxaban 15 MG Tabs tablet Commonly known as: XARELTO Take 1 tablet (15 mg total) by mouth daily with supper.   rOPINIRole 0.5 MG tablet Commonly known as: REQUIP Take 1 tablet (0.5 mg total) by mouth daily at 8 pm.   sennosides-docusate sodium 8.6-50 MG tablet Commonly known as: SENOKOT-S Take 1 tablet by mouth daily as needed for constipation.   TUMS ULTRA 1000 PO Take 1,000 mg by mouth 2 (two) times daily as needed (heartburn/indigestion).   zolpidem 5 MG tablet Commonly known as: AMBIEN Take 1 tablet (5 mg total) by mouth at bedtime. What changed: Another medication with the same name was removed. Continue taking this medication, and follow the directions you see here.            Durable Medical Equipment  (  From admission, onward)         Start     Ordered   10/28/20 0757  Heart failure home health orders  (Heart failure home health orders / Face to face)  Once       Comments: Heart Failure Follow-up Care:  Verify follow-up appointments per Patient Discharge Instructions. Confirm transportation arranged. Reconcile home medications with discharge medication list. Remove discontinued medications from use. Assist patient/caregiver to manage medications using pill box. Reinforce low sodium food selection Assessments: Vital signs and oxygen saturation at each visit. Assess home environment for safety concerns, caregiver support and availability of low-sodium foods. Consult Education officer, museum, PT/OT, Dietitian, and CNA based on assessments. Perform comprehensive cardiopulmonary assessment. Notify MD for any change in condition or weight gain of 3 pounds in one day or 5 pounds in one week with symptoms. Daily Weights and Symptom  Monitoring: Ensure patient has access to scales. Teach patient/caregiver to weigh daily before breakfast and after voiding using same scale and record.    Teach patient/caregiver to track weight and symptoms and when to notify Provider. Activity: Develop individualized activity plan with patient/caregiver.   Home Paraenteral Inotropic Therapy : Data Collection Form  Patients name: Cheryl Vargas   Date: 10/28/20  Information below may not be completed by the supplier nor anyone in a Financial relationship with the supplier.  1. Results of invasive hemodynamic monitoring  Cardiac Index Before Inotrope infusion:            1.5             On Inotrope infusion:            1.9             Drug and dose:   Milrinone 0.82mcg   2. Cardiac medications immediately prior to inotrope infusion (List name, dose, and frequency) Lasix 40/20   3. Dose this represent maximum tolerated doses of these medications? Yes.  4. Breathing status Prior to inotrope infusion: Dyspnea at rest  At time of discharge: Dyspnea on moderate exertion.   5. Initial home prescription Drug and Dose:   Milrinone 0.5 mcg  for continuous infusion 24/hr day and 7 days/week  6. If continuous infusion is prescribed, have attempts to discontinue inotrope infusion in the hospital failed?   Yes  7. If intermittent infusion is prescribed, have there been repeated hospitalizations for heart failure which Parenteral inotrope were required? Not applicable.   8. Is patient capable of going to the physician for outpatient evaluation? Yes.    9. Is routine electrocardiographic monitoring required in the Home?  No.   The above statements and any additional explanations included separately are true and accurate and there is documentation present in the patients medical record to support these statements.   Completed by Darrick Grinder, NP   In instances where this form was completed by an Advanced Practice Provider, please see EMR  for physician Co-Signature.  [facility] to provide  Labs every other week to include BMET, Mg, and CBC with Diff. Additional as needed. Should be drawn via PERIPHERAL stick. NOT PICC line.   H4193 Milrinone 0.5 mcg/kg/min X 52 weeks  A4221 Supplies for maintenance of drug infusion catheter A4222 Supplies for the external drug infusion per cassette or bag E0781 Ambulatory Infusion pump  Question Answer Comment  Heart Failure Follow-up Care [facility] at [phone]   Obtain the following labs Basic Metabolic Panel   Lab frequency Weekly  Fax lab results to [facility] Clinic at [phone]   Diet Low Sodium Heart Healthy   Fluid restrictions: 1800 mL Fluid      10/28/20 [facility] Follow up.   Specialty: Home Health Services Why: Registered Nurse, Physical Therapy, Occupational Therapy, Aide-office to call with visit times.  Contact information: [place] [address] [phone]        [facility] Follow up on 11/12/2020.   Specialty: Cardiology Why: 11:00 AM Parking Garage Code [id number]  Contact information: [address] [id number] [place] [phone]             Allergies  Allergen Reactions  . Sotalol Other (See Comments)    Prolonged QTc  . Alendronate Sodium Other (See Comments)    Arm pain  . Compazine [Prochlorperazine] Other (See Comments)    Nervous reaction  . Dofetilide Nausea Only    Reported by [facility] physicians  . Lisinopril Other (See Comments)    Low bp  . Zolpidem Tartrate Er Other (See Comments)    Cannot take extended release - makes her too tired the next day  . Ciprofloxacin Other (See Comments)    Not effective  . Coreg [Carvedilol] Other (See Comments)    Fatigue   . Metoprolol Other (See Comments)    Profound fatigue    Consultations: Cardiology    Procedures/Studies: CT ABDOMEN PELVIS WO CONTRAST  Result Date: 10/22/2020 CLINICAL DATA:  Multiple falls x1 week with weakness on anticoagulants. EXAM: CT ABDOMEN AND PELVIS WITHOUT CONTRAST TECHNIQUE: Multidetector CT imaging of the abdomen and pelvis was performed following the standard protocol without IV contrast. COMPARISON:  Feb 03, 2020 FINDINGS: Lower chest: No acute abnormality. Hepatobiliary: Stable cystic appearing areas are seen within the anterior aspect of the right lobe of the liver and medial aspect of the left lobe. No gallstones, gallbladder wall thickening, or biliary dilatation. Pancreas: Unremarkable. No pancreatic ductal dilatation or surrounding inflammatory changes. Spleen: Normal in size without focal abnormality. Adrenals/Urinary Tract: Adrenal glands are unremarkable. Kidneys are normal in size, without renal calculi or hydronephrosis. A stable 8.3 cm x 6.6 cm mildly complex cyst is seen within the right kidney. Bladder is limited in evaluation secondary to overlying streak artifact. Stomach/Bowel: There is a small hiatal hernia. Appendix appears normal. No evidence of bowel wall thickening, distention, or inflammatory changes. Noninflamed diverticula are seen throughout the sigmoid colon. Vascular/Lymphatic: Marked severity aortic calcification and atherosclerosis. No enlarged abdominal or pelvic lymph nodes. Reproductive: The expected region of the uterus and bilateral adnexa is limited in evaluation secondary to overlying streak artifact. Other: No abdominal wall hernia or abnormality. No abdominopelvic ascites. Musculoskeletal: A total left hip replacement is seen. An extensive amount of associated streak artifact is noted with subsequently limited evaluation of the adjacent osseous and soft tissue structures. Multilevel degenerative changes seen throughout the lumbar spine. IMPRESSION: 1. Limited evaluation of the pelvis secondary to overlying streak artifact. 2. Stable cystic  appearing areas within the liver and right kidney. 3. Sigmoid diverticulosis. 4. Small hiatal hernia. 5. Total left hip replacement. 6. Aortic atherosclerosis. Aortic Atherosclerosis (ICD10-I70.0). Electronically Signed   By: Virgina Norfolk M.D.   On: 10/22/2020 15:57   DG Chest 2 View  Result Date: 10/22/2020 CLINICAL DATA:  Suspected sepsis. EXAM: CHEST - 2 VIEW COMPARISON:  08/02/2020 FINDINGS: Right jugular central venous catheter tip in the lower SVC. AICD in satisfactory position and unchanged. Interval placement of coils in the pulmonary vascularity posterior to the left atrium for pseudoaneurysm of the pulmonary artery. Mild bibasilar atelectasis similar to the prior study. Negative for pleural effusion. IMPRESSION: Mild bibasilar atelectasis unchanged from the prior study. Electronically Signed   By: Franchot Gallo M.D.   On: 10/22/2020 12:56   DG Pelvis 1-2 Views  Result Date: 10/23/2020 CLINICAL DATA:  Multiple recent falls. EXAM: PELVIS - 1-2 VIEW COMPARISON:  CT abdomen pelvis from yesterday. FINDINGS: There is no evidence of pelvic fracture or diastasis. Prior left total hip arthroplasty. Chronic slight periprosthetic lucency measuring less than 2 mm, within normal limits. Calcified granulomas in both buttocks. IMPRESSION: 1. No acute osseous abnormality. Electronically Signed   By: Titus Dubin M.D.   On: 10/23/2020 09:45   CT Head Wo Contrast  Result Date: 10/22/2020 CLINICAL DATA:  Multiple falls last week.  On anticoagulation. EXAM: CT HEAD WITHOUT CONTRAST TECHNIQUE: Contiguous axial images were obtained from the base of the skull through the vertex without intravenous contrast. COMPARISON:  Brain MRI, 12/01/2015. FINDINGS: Brain: No evidence of acute infarction, hemorrhage, hydrocephalus, extra-axial collection or mass lesion/mass effect. There is mild ventricular enlargement consistent with age appropriate volume loss. Mild periventricular white matter hypoattenuation is also  noted consistent with chronic microvascular ischemic change. Vascular: No hyperdense vessel or unexpected calcification. Skull: Normal. Negative for fracture or focal lesion. Sinuses/Orbits: Globes and orbits are unremarkable. Sinuses are clear. Other: None. IMPRESSION: 1. No acute intracranial abnormalities. 2. Age-appropriate volume loss and mild chronic microvascular ischemic change. Electronically Signed   By: Lajean Manes M.D.   On: 10/22/2020 15:50   DG Abd Portable 1V  Result Date: 10/30/2020 CLINICAL DATA:  86 year old female with lower abdominal pressure EXAM: PORTABLE ABDOMEN - 1 VIEW COMPARISON:  CT 10/22/2020, 08/04/2020 FINDINGS: Gas within small bowel and colon. No abnormally distended small bowel. Formed stool within the rectum. No abnormally distended colonic loops. No unexpected radiopaque foreign body.  No unexpected calcification. Degenerative changes of the spine with associated curvature. IMPRESSION: Negative four bowel obstruction. Formed stool in the colon, potentially constipation. Electronically Signed   By: Corrie Mckusick D.O.   On: 10/30/2020 08:54     Subjective: This morning she was having cramping abdominal pain, had difficult night, she was constipated.  She was manually disimpacted.   This afternoon she is feeling better, feels ok with going home today   Discharge Exam: Vitals:   10/30/20 0405 10/30/20 1100  BP: 113/63 97/64  Pulse: 80   Resp: (!) 21 18  Temp: 97.9 F (36.6 C)   SpO2: 90%      General: Pt is alert, awake, not in acute distress Cardiovascular: RRR, S1/S2 +, no rubs, no gallops Respiratory: CTA bilaterally, no wheezing, no rhonchi Abdominal: Soft, NT, ND, bowel sounds + Extremities: no edema, no cyanosis    The results of significant diagnostics from this hospitalization (including imaging, microbiology, ancillary and laboratory) are listed below for reference.     Microbiology: Recent Results (from the past 240 hour(s))  Culture,  blood (Routine x 2)     Status: None   Collection Time: 10/22/20 12:27 PM   Specimen: BLOOD  Result Value Ref Range Status   Specimen Description BLOOD RIGHT ANTECUBITAL  Final   Special Requests   Final    BOTTLES DRAWN AEROBIC AND ANAEROBIC Blood Culture adequate volume   Culture  Final    NO GROWTH 5 DAYS Performed at [facility], [place] [address], [place], [place] [address]    Report Status 10/27/2020 FINAL  Final  Resp Panel by RT-PCR (Flu A&B, Covid) Nasopharyngeal Swab     Status: None   Collection Time: 10/22/20  2:26 PM   Specimen: Nasopharyngeal Swab; Nasopharyngeal(NP) swabs in vial transport medium  Result Value Ref Range Status   SARS Coronavirus 2 by RT PCR NEGATIVE NEGATIVE Final    Comment: (NOTE) SARS-CoV-2 target nucleic acids are NOT DETECTED.  The SARS-CoV-2 RNA is generally detectable in upper respiratory specimens during the acute phase of infection. The lowest concentration of SARS-CoV-2 viral copies this assay can detect is 138 copies/mL. A negative result does not preclude SARS-Cov-2 infection and should not be used as the sole basis for treatment or other patient management decisions. A negative result may occur with  improper specimen collection/handling, submission of specimen other than nasopharyngeal swab, presence of viral mutation(s) within the areas targeted by this assay, and inadequate number of viral copies(<138 copies/mL). A negative result must be combined with clinical observations, patient history, and epidemiological information. The expected result is Negative.  Fact Sheet for Patients:  EntrepreneurPulse.com.au  Fact Sheet for Healthcare Providers:  IncredibleEmployment.be  This test is no t yet approved or cleared by the [place] FDA and  has been authorized for detection and/or diagnosis of SARS-CoV-2 by FDA under an Emergency Use Authorization (EUA). This EUA will remain  in effect  (meaning this test can be used) for the duration of the COVID-19 declaration under Section 564(b)(1) of the Act, 21 U.S.C.section 360bbb-3(b)(1), unless the authorization is terminated  or revoked sooner.       Influenza A by PCR NEGATIVE NEGATIVE Final   Influenza B by PCR NEGATIVE NEGATIVE Final    Comment: (NOTE) The Xpert Xpress SARS-CoV-2/FLU/RSV plus assay is intended as an aid in the diagnosis of influenza from Nasopharyngeal swab specimens and should not be used as a sole basis for treatment. Nasal washings and aspirates are unacceptable for Xpert Xpress SARS-CoV-2/FLU/RSV testing.  Fact Sheet for Patients: EntrepreneurPulse.com.au  Fact Sheet for Healthcare Providers: IncredibleEmployment.be  This test is not yet approved or cleared by the [place] FDA and has been authorized for detection and/or diagnosis of SARS-CoV-2 by FDA under an Emergency Use Authorization (EUA). This EUA will remain in effect (meaning this test can be used) for the duration of the COVID-19 declaration under Section 564(b)(1) of the Act, 21 U.S.C. section 360bbb-3(b)(1), unless the authorization is terminated or revoked.  Performed at [facility], [place] [address] [place], [place] [address]      Labs: BNP (last 3 results) Recent Labs    08/02/20 0832 10/22/20 1339  BNP 1,285.0* 9,379.0*   Basic Metabolic Panel: Recent Labs  Lab 10/27/20 0510 10/28/20 0500 10/29/20 0137 10/30/20 0715 10/30/20 1500  NA 136 136 133* 131* 131*  K 3.0* 3.7 3.2* 2.8* 3.8  CL 101 98 92* 87* 87*  CO2 25 28 30  32 33*  GLUCOSE 126* 95 106* 150* 128*  BUN 36* 30* 34* 37* 34*  CREATININE 1.45* 1.21* 1.24* 1.30* 1.27*  CALCIUM 7.7* 7.9* 8.3* 8.5* 8.9  MG 1.8 2.0  --   --   --    Liver Function Tests: No results for input(s): AST, ALT, ALKPHOS, BILITOT, PROT, ALBUMIN in the last 168 hours. No results for input(s): LIPASE, AMYLASE in the last 168 hours. No  results  for input(s): AMMONIA in the last 168 hours. CBC: No results for input(s): WBC, NEUTROABS, HGB, HCT, MCV, PLT in the last 168 hours. Cardiac Enzymes: No results for input(s): CKTOTAL, CKMB, CKMBINDEX, TROPONINI in the last 168 hours. BNP: Invalid input(s): POCBNP CBG: No results for input(s): GLUCAP in the last 168 hours. D-Dimer No results for input(s): DDIMER in the last 72 hours. Hgb A1c No results for input(s): HGBA1C in the last 72 hours. Lipid Profile No results for input(s): CHOL, HDL, LDLCALC, TRIG, CHOLHDL, LDLDIRECT in the last 72 hours. Thyroid function studies No results for input(s): TSH, T4TOTAL, T3FREE, THYROIDAB in the last 72 hours.  Invalid input(s): FREET3 Anemia work up No results for input(s): VITAMINB12, FOLATE, FERRITIN, TIBC, IRON, RETICCTPCT in the last 72 hours. Urinalysis    Component Value Date/Time   COLORURINE YELLOW 10/23/2020 0946   APPEARANCEUR CLEAR 10/23/2020 0946   LABSPEC >1.030 (H) 10/23/2020 0946   PHURINE 5.5 10/23/2020 0946   GLUCOSEU NEGATIVE 10/23/2020 0946   HGBUR TRACE (A) 10/23/2020 0946   BILIRUBINUR NEGATIVE 10/23/2020 0946   KETONESUR NEGATIVE 10/23/2020 0946   PROTEINUR 100 (A) 10/23/2020 0946   UROBILINOGEN 0.2 03/17/2013 1423   NITRITE NEGATIVE 10/23/2020 0946   LEUKOCYTESUR SMALL (A) 10/23/2020 0946   Sepsis Labs Invalid input(s): PROCALCITONIN,  WBC,  LACTICIDVEN Microbiology Recent Results (from the past 240 hour(s))  Culture, blood (Routine x 2)     Status: None   Collection Time: 10/22/20 12:27 PM   Specimen: BLOOD  Result Value Ref Range Status   Specimen Description BLOOD RIGHT ANTECUBITAL  Final   Special Requests   Final    BOTTLES DRAWN AEROBIC AND ANAEROBIC Blood Culture adequate volume   Culture   Final    NO GROWTH 5 DAYS Performed at [facility], [place] [address], [place], [place] [address]    Report Status 10/27/2020 FINAL  Final  Resp Panel by RT-PCR (Flu A&B, Covid) Nasopharyngeal  Swab     Status: None   Collection Time: 10/22/20  2:26 PM   Specimen: Nasopharyngeal Swab; Nasopharyngeal(NP) swabs in vial transport medium  Result Value Ref Range Status   SARS Coronavirus 2 by RT PCR NEGATIVE NEGATIVE Final    Comment: (NOTE) SARS-CoV-2 target nucleic acids are NOT DETECTED.  The SARS-CoV-2 RNA is generally detectable in upper respiratory specimens during the acute phase of infection. The lowest concentration of SARS-CoV-2 viral copies this assay can detect is 138 copies/mL. A negative result does not preclude SARS-Cov-2 infection and should not be used as the sole basis for treatment or other patient management decisions. A negative result may occur with  improper specimen collection/handling, submission of specimen other than nasopharyngeal swab, presence of viral mutation(s) within the areas targeted by this assay, and inadequate number of viral copies(<138 copies/mL). A negative result must be combined with clinical observations, patient history, and epidemiological information. The expected result is Negative.  Fact Sheet for Patients:  EntrepreneurPulse.com.au  Fact Sheet for Healthcare Providers:  IncredibleEmployment.be  This test is no t yet approved or cleared by the [place] FDA and  has been authorized for detection and/or diagnosis of SARS-CoV-2 by FDA under an Emergency Use Authorization (EUA). This EUA will remain  in effect (meaning this test can be used) for the duration of the COVID-19 declaration under Section 564(b)(1) of the Act, 21 U.S.C.section 360bbb-3(b)(1), unless the authorization is terminated  or revoked sooner.       Influenza A by PCR NEGATIVE NEGATIVE Final  Influenza B by PCR NEGATIVE NEGATIVE Final    Comment: (NOTE) The Xpert Xpress SARS-CoV-2/FLU/RSV plus assay is intended as an aid in the diagnosis of influenza from Nasopharyngeal swab specimens and should not be used as a sole  basis for treatment. Nasal washings and aspirates are unacceptable for Xpert Xpress SARS-CoV-2/FLU/RSV testing.  Fact Sheet for Patients: EntrepreneurPulse.com.au  Fact Sheet for Healthcare Providers: IncredibleEmployment.be  This test is not yet approved or cleared by the [place] FDA and has been authorized for detection and/or diagnosis of SARS-CoV-2 by FDA under an Emergency Use Authorization (EUA). This EUA will remain in effect (meaning this test can be used) for the duration of the COVID-19 declaration under Section 564(b)(1) of the Act, 21 U.S.C. section 360bbb-3(b)(1), unless the authorization is terminated or revoked.  Performed at [facility], [place] [address] [place], [place] [address]      Time coordinating discharge: 40 minutes  SIGNED:   Elmarie Shiley, MD  [facility]

## 2020-10-30 NOTE — Progress Notes (Signed)
Patient ID: Cheryl Vargas, female   DOB: 1935-06-04, 86 y.o.   MRN: [id number]     Advanced Heart Failure Rounding Note  PCP-Cardiologist: [name], MD   Subjective:    2/14 milrinone increased to 0.375 mcg  2/16  Milrinone increased to 0.5 mcg.   Co-ox 62%.  Now on po diuretics.  Feels bad today, slept poorly and constipated.  Had to be manually disimpacted this morning.  K low but has been replaced.     Objective:   Weight Range: 53 kg Body mass index is 19.44 kg/m.   Vital Signs:   Temp:  [97.6 F (36.4 C)-98.2 F (36.8 C)] 97.9 F (36.6 C) (02/19 0405) Pulse Rate:  [79-80] 80 (02/19 0405) Resp:  [13-21] 18 (02/19 1100) BP: (94-120)/(55-64) 97/64 (02/19 1100) SpO2:  [90 %-95 %] 90 % (02/19 0405) Weight:  [53 kg] 53 kg (02/19 0405) Last BM Date: 10/30/20  Weight change: Filed Weights   10/29/20 0445 10/29/20 0924 10/30/20 0405  Weight: 53.8 kg 118.9 kg 53 kg    Intake/Output:   Intake/Output Summary (Last 24 hours) at 10/30/2020 1116 Last data filed at 10/30/2020 1100 Gross per 24 hour  Intake 990 ml  Output 1600 ml  Net -610 ml      Physical Exam   General: NAD Neck: JVP 8 cm, no thyromegaly or thyroid nodule.  Lungs: Clear to auscultation bilaterally with normal respiratory effort. CV: Nondisplaced PMI.  Heart regular S1/S2, no S3/S4, 1/6 HSM apex.  No peripheral edema.   Abdomen: Soft, nontender, no hepatosplenomegaly, no distention.  Skin: Intact without lesions or rashes.  Neurologic: Alert and oriented x 3.  Psych: Normal affect. Extremities: No clubbing or cyanosis.  HEENT: Normal.    Telemetry   Atrial fibrillation with BiV pacing (personally reviewed).   EKG   none  Labs    CBC No results for input(s): WBC, NEUTROABS, HGB, HCT, MCV, PLT in the last 72 hours. Basic Metabolic Panel Recent Labs    10/28/20 0500 10/29/20 0137 10/30/20 0715  NA 136 133* 131*  K 3.7 3.2* 2.8*  CL 98 92* 87*  CO2 28 30 32  GLUCOSE 95 106* 150*   BUN 30* 34* 37*  CREATININE 1.21* 1.24* 1.30*  CALCIUM 7.9* 8.3* 8.5*  MG 2.0  --   --    Liver Function Tests No results for input(s): AST, ALT, ALKPHOS, BILITOT, PROT, ALBUMIN in the last 72 hours. No results for input(s): LIPASE, AMYLASE in the last 72 hours. Cardiac Enzymes No results for input(s): CKTOTAL, CKMB, CKMBINDEX, TROPONINI in the last 72 hours.  BNP: BNP (last 3 results) Recent Labs    08/02/20 0832 10/22/20 1339  BNP 1,285.0* 1,503.8*    ProBNP (last 3 results) No results for input(s): PROBNP in the last 8760 hours.   D-Dimer No results for input(s): DDIMER in the last 72 hours. Hemoglobin A1C No results for input(s): HGBA1C in the last 72 hours. Fasting Lipid Panel No results for input(s): CHOL, HDL, LDLCALC, TRIG, CHOLHDL, LDLDIRECT in the last 72 hours. Thyroid Function Tests No results for input(s): TSH, T4TOTAL, T3FREE, THYROIDAB in the last 72 hours.  Invalid input(s): FREET3  Other results:   Imaging    DG Abd Portable 1V  Result Date: 10/30/2020 CLINICAL DATA:  86 year old female with lower abdominal pressure EXAM: PORTABLE ABDOMEN - 1 VIEW COMPARISON:  CT 10/22/2020, 08/04/2020 FINDINGS: Gas within small bowel and colon. No abnormally distended small bowel. Formed stool within the rectum.  No abnormally distended colonic loops. No unexpected radiopaque foreign body.  No unexpected calcification. Degenerative changes of the spine with associated curvature. IMPRESSION: Negative four bowel obstruction. Formed stool in the colon, potentially constipation. Electronically Signed   By: Corrie Mckusick D.O.   On: 10/30/2020 08:54     Medications:     Scheduled Medications: . amiodarone  200 mg Oral Daily  . calcium carbonate  1 tablet Oral TID WC  . Chlorhexidine Gluconate Cloth  6 each Topical Daily  . feeding supplement  237 mL Oral BID BM  . furosemide  40 mg Oral QPM  . furosemide  60 mg Oral Q breakfast  . levothyroxine  50 mcg Oral QAC  breakfast  . magnesium oxide  200 mg Oral BID  . midodrine  10 mg Oral BID WC  . multivitamin with minerals  1 tablet Oral Daily  . pantoprazole  40 mg Oral Daily  . potassium chloride  40 mEq Oral Once  . Rivaroxaban  15 mg Oral Q supper  . rOPINIRole  0.5 mg Oral Q2000  . senna-docusate  1 tablet Oral BID  . sodium chloride flush  10-40 mL Intracatheter Q12H  . sodium phosphate  1 enema Rectal Once    Infusions: . milrinone 0.5 mcg/kg/min (10/29/20 2002)    PRN Medications: acetaminophen, LORazepam, morphine injection, ondansetron (ZOFRAN) IV, sodium chloride flush, zolpidem     Assessment/Plan   1. Acute on chronic systolic HF: [place] 01/3613 showed no significant coronary disease. Cardiac MRI 10/17 EF 36%, diffuse HK, normal RV, biatrial enlargement, moderate MR, non-coronary LGE pattern involving the mid-wall of the basal to mid septum and inferior wall. Possibly prior myocarditis versus a form of infiltrative disease. Medtronic CRT-D s/p AV nodal ablation. Echo in 1/21 showed EF <20% with moderate LV dilation and mildly decreased RV systolic function. Echo in 12/21 with EF 20-25%, severe MR, severe TR, moderate AI.Low output on 1/21 RHC (CI 1.72) and 12/21 RHC (CI 1.8). NYHA class IIIb symptoms. End stage HF now with home milrinone.Admitted with failure to thrive, fatigue, AKI, persistent nausea.  I think that these symptoms signify worsening of her HF.  No evidence for UTI or other infection. We have increased milrinone to 0.5 mcg/kg/min and have diuresed with IV Lasix, now back to po.  Volume looks ok.  - Continue milrinone 0.5 at home, this is maximum dose.  - Continue po Lasix 60 qam/40 qpm today (was on 40 qam/20 qpm at home).  - K is low, replaced today.  Increase to 40 mEq bid for home.  - She is off losartan, bisoprololand spironolactone with symptomatic hypotension and now is on midodrine 10 mg bid. - Off digoxin with elevated level. - Low output on  1/21and 12/21RHCs. Given her age and frailty,poor candidate for LVAD.End stage CHF now with marked fatigue. She has progressive heart failure. She is followed by palliative care and is DNR, reasonable to move onto full hospice ([facility] to allow continued milrinone) when she is ready.  Palliative care service following here.   2. Atrial fibrillation: Chronic, s/p AV nodal ablation and BiV pacing.  - Continue Xarelto 15 mg daily.  3. PVCs/VT: Frequent PVCs have limited BiV pacing. Not a good PVC ablation candidate per EP. Had VT in 1/21. Cannot tolerate sotalol orTikosyn. Did not tolerate mexilitine with nausea. Had nausea also with ranolazine. She had nausea in the past with amiodarone but seems to be tolerating it now. -Continue amiodarone 200 mg daily for PVC  suppression to try to promote BiV pacing. 4. Mitral regurgitation:Severe on 12/21 echo, not candidate for intervention. 5. Hypothyroidism: Started on Levoxyl by primary service.  6. Constipation: Manually disimpacted today.   I think we have her as optimized as we can get her in the setting of end-stage HF.  She will need milrinone 0.5 at home, should be ready today. Would continue to have her followed by palliative care services at home, transition to [facility] hospice (takes milrinone) when ready.  From my standpoint, she could go home today.  Cardiac meds for discharge: KCl 40 mEq bid, Lasix 60 qam/40 qpm, milrinone 0.5 (increased dose), midodrine 10 mg tid, amiodarone 200 daily, Xarelto 15 mg daily. Will need bowel regimen as well.   [name] 10/30/2020 11:16 AM

## 2020-11-01 ENCOUNTER — Other Ambulatory Visit (HOSPITAL_COMMUNITY): Payer: Self-pay | Admitting: Cardiology

## 2020-11-01 DIAGNOSIS — N1831 Chronic kidney disease, stage 3a: Secondary | ICD-10-CM | POA: Diagnosis not present

## 2020-11-01 DIAGNOSIS — M1990 Unspecified osteoarthritis, unspecified site: Secondary | ICD-10-CM | POA: Diagnosis not present

## 2020-11-01 DIAGNOSIS — I4819 Other persistent atrial fibrillation: Secondary | ICD-10-CM | POA: Diagnosis not present

## 2020-11-01 DIAGNOSIS — Z7901 Long term (current) use of anticoagulants: Secondary | ICD-10-CM | POA: Diagnosis not present

## 2020-11-01 DIAGNOSIS — Z95810 Presence of automatic (implantable) cardiac defibrillator: Secondary | ICD-10-CM | POA: Diagnosis not present

## 2020-11-01 DIAGNOSIS — Z79899 Other long term (current) drug therapy: Secondary | ICD-10-CM | POA: Diagnosis not present

## 2020-11-01 DIAGNOSIS — I130 Hypertensive heart and chronic kidney disease with heart failure and stage 1 through stage 4 chronic kidney disease, or unspecified chronic kidney disease: Secondary | ICD-10-CM | POA: Diagnosis not present

## 2020-11-01 DIAGNOSIS — I5042 Chronic combined systolic (congestive) and diastolic (congestive) heart failure: Secondary | ICD-10-CM | POA: Diagnosis not present

## 2020-11-01 DIAGNOSIS — E785 Hyperlipidemia, unspecified: Secondary | ICD-10-CM | POA: Diagnosis not present

## 2020-11-01 DIAGNOSIS — Z452 Encounter for adjustment and management of vascular access device: Secondary | ICD-10-CM | POA: Diagnosis not present

## 2020-11-01 DIAGNOSIS — I428 Other cardiomyopathies: Secondary | ICD-10-CM | POA: Diagnosis not present

## 2020-11-01 DIAGNOSIS — I472 Ventricular tachycardia: Secondary | ICD-10-CM | POA: Diagnosis not present

## 2020-11-01 DIAGNOSIS — I340 Nonrheumatic mitral (valve) insufficiency: Secondary | ICD-10-CM | POA: Diagnosis not present

## 2020-11-01 DIAGNOSIS — Z9181 History of falling: Secondary | ICD-10-CM | POA: Diagnosis not present

## 2020-11-01 DIAGNOSIS — Z96642 Presence of left artificial hip joint: Secondary | ICD-10-CM | POA: Diagnosis not present

## 2020-11-02 ENCOUNTER — Telehealth: Payer: Self-pay

## 2020-11-02 NOTE — Telephone Encounter (Signed)
Phone call placed to patient to check in after recent hospitalization and to offer to schedule follow up visit with Palliative NP. VM left with call back information

## 2020-11-03 DIAGNOSIS — I428 Other cardiomyopathies: Secondary | ICD-10-CM | POA: Diagnosis not present

## 2020-11-03 DIAGNOSIS — I5023 Acute on chronic systolic (congestive) heart failure: Secondary | ICD-10-CM | POA: Diagnosis not present

## 2020-11-03 DIAGNOSIS — I5043 Acute on chronic combined systolic (congestive) and diastolic (congestive) heart failure: Secondary | ICD-10-CM | POA: Diagnosis not present

## 2020-11-04 ENCOUNTER — Telehealth: Payer: Self-pay

## 2020-11-04 NOTE — Telephone Encounter (Signed)
Received message to call patient's son, [name]. Number provided not in service. Phone call placed to patient's son, [name]. Tim shared that patient has displayed a decline.She is sleeping more, poor po intake and has not been out of bed since she returned from hospital. Visit scheduled with Palliative NP for 11/05/20 @ 9:30 am.

## 2020-11-05 ENCOUNTER — Other Ambulatory Visit: Payer: PPO | Admitting: Hospice

## 2020-11-05 ENCOUNTER — Other Ambulatory Visit: Payer: Self-pay

## 2020-11-05 DIAGNOSIS — R5383 Other fatigue: Secondary | ICD-10-CM | POA: Diagnosis not present

## 2020-11-05 DIAGNOSIS — Z515 Encounter for palliative care: Secondary | ICD-10-CM | POA: Diagnosis not present

## 2020-11-05 NOTE — Progress Notes (Addendum)
[facility] Consult Note Telephone: [phone]  Fax: [phone]  PATIENT NAME: Cheryl Vargas DOB: May 29, 1935 MRN: [id number]  PRIMARY CARE PROVIDER:   Lavone Orn, MD Lavone Orn, MD [place] [address] [place],  [place] [address]  REFERRING PROVIDER: Lavone Orn, MD Lavone Orn, MD [place] [address] [place],   [id number]   REFERRING PROVIDER:Bryan Rolla Plate, NP/Dr. Seward Carol RESPONSIBLE PARTY:Son/HCPOA [name] [phone] ([place])PT in [place] [phone] Son [name] [phone] [name]- son lives in [place] [phone] - Call if needed Patient's tel: [phone] Patient prefers to be called Cheryl Vargas  and be texted - [phone]   Visit is to build trust and highlight Palliative Medicine as specialized medical care for people living with serious illness, aimed at facilitating better quality of life through symptoms relief, assisting with advance care plan and establishing goals of care.  [name] is present with patient during visit.  CHIEF COMPLAINT: palliative visit/fatigue  RECOMMENDATIONS/PLAN:   1. Advance Care Planning/Code Status: Discussion on the ramifications and implications of CODE STATUS.  Patient affirmed she is a DO NOT RESUSCITATE.  2. Goals of Care: Goals of care include to maximize quality of life and symptom management.  Recent MOST selections include DO NOT RESUSCITATE, limited additional intervention, determine use of IV fluids, determine use of antibiotics, no feeding tube.  Tim to send a copy to NP to update electronic medical record epic.  Visit consisted of counseling and education dealing with the complex and emotionally intense issues of symptom management and palliative care in the setting of serious and potentially life-threatening illness. Palliative care team will continue to support patient, patient's family, and medical team.   Patient/family is open to hospice service in the future when patient qualifies for it.   Extensive discussion on shift from disease focused treatment to symptom management/comfort, as well as discussion on parameters for hospice service.  Patient/family expressed appreciation for the information provided.  I spent  46 minutes providing this consultation. More than 50% of the time in this consultation was spent on coordinating communication.  -------------------------------------------------------------------------------------------------------------------------------------------------- 3. Symptom management/Plan:  Fatigue: Continue PT/OT.  Balance of rest and activity performance.  Offer small choice meals several times a day and encourage adequate oral intake.  Patient is on milrinone drip at home, with Cardiology recommending transition to hospice care if her overall condition worsen. Milrinone drip managed by [place]. Palliative will continue to monitor for symptom management/decline and make recommendations as needed. Return  in a week/as needed.  Encouraged to call provider sooner with any concerns.   HISTORY OF PRESENT ILLNESS:  Cheryl Vargas is a 86 y.o. female with multiple medical problems including fatigue which has been chronic, worsened in the last 2 weeks, likely related to decompensated combined systolic/diastolic congestive heart failure, ejection fraction 20 to 25%; patient with with significant history of nonischemic cardiomyopathy.  Fatigue impairs physiological reserve and results in reduced overall function.  Epic chart review reveals patient was admitted in the hospital for weakness/cardiac decompensation 2/11-2 10/30/2020, patient was diuresed with IV Lasix and started on milrinone pump.  History of severe mitral valve regurgitation, CKD stage IIIb, hypertension, arthritis, Afib, near syncope. History obtained from review of EMR, discussion with patient/family. Review and  summarization of Epic records shows history from other than patient. Rest of 10 point ROS asked and negative. Palliative Care was asked to follow this patient by consultation  request of Lavone Orn, MD to help address complex decision making in the context of goals of care.   CODE STATUS: DNR  PPS: 40%  HOSPICE ELIGIBILITY/DIAGNOSIS: TBD  PAST MEDICAL HISTORY:  Past Medical History:  Diagnosis Date  . Arthritis   . Chronic combined systolic (congestive) and diastolic (congestive) heart failure ([place])   . Mitral regurgitation   . Non-ischemic cardiomyopathy ([facility])   . Persistent atrial fibrillation ([place])   . Ventricular tachycardia ([facility])     SOCIAL HX: @SOCX  Patient at home  for ongoing care   FAMILY HX:  Family History  Problem Relation Age of Onset  . Lung cancer Mother   . Heart attack Father     Review lab tests/diagnostics No results for input(s): WBC, HGB, HCT, PLT, MCV in the last 168 hours. Recent Labs  Lab 10/30/20 0715 10/30/20 1500  NA 131* 131*  K 2.8* 3.8  CL 87* 87*  CO2 32 33*  BUN 37* 34*  CREATININE 1.30* 1.27*  GLUCOSE 150* 128*   Latest GFR by Cockcroft Gault (not valid in AKI or ESRD) Estimated Creatinine Clearance: 28.1 mL/min (A) (by C-G formula based on SCr of 1.27 mg/dL (H)). No results for input(s): AST, ALT, ALKPHOS, GGT in the last 168 hours.  Invalid input(s): TBILI, CONJBILI, ALB, TOTALPROTEIN No components found for: ALB No results for input(s): APTT, INR in the last 168 hours.  Invalid input(s): PTPATIENT No results for input(s): BNP, PROBNP in the last 168 hours.  ALLERGIES:  Allergies  Allergen Reactions  . Sotalol Other (See Comments)    Prolonged QTc  . Alendronate Sodium Other (See Comments)    Arm pain  . Compazine [Prochlorperazine] Other (See Comments)    Nervous reaction  . Dofetilide Nausea Only    Reported by [facility] physicians  . Lisinopril Other (See Comments)    Low bp  . Zolpidem Tartrate Er Other (See  Comments)    Cannot take extended release - makes her too tired the next day  . Ciprofloxacin Other (See Comments)    Not effective  . Coreg [Carvedilol] Other (See Comments)    Fatigue   . Metoprolol Other (See Comments)    Profound fatigue      PERTINENT MEDICATIONS:  Outpatient Encounter Medications as of 11/05/2020  Medication Sig  . acetaminophen (TYLENOL) 500 MG tablet Take 500-1,000 mg by mouth every 6 (six) hours as needed for headache (pain).  [place] Kitchen amiodarone (PACERONE) 200 MG tablet Take 1 tablet (200 mg total) by mouth daily.  . Calcium Carbonate Antacid (TUMS ULTRA 1000 PO) Take 1,000 mg by mouth 2 (two) times daily as needed (heartburn/indigestion).  . fluticasone (FLONASE) 50 MCG/ACT nasal spray Place 1 spray into both nostrils daily as needed for allergies or rhinitis (seasonal allergies).  . furosemide (LASIX) 20 MG tablet Take 3 tablets (60 mg total) by mouth daily with breakfast.  . furosemide (LASIX) 40 MG tablet Take 1 tablet (40 mg total) by mouth every evening.  Marland Kitchen levothyroxine (SYNTHROID) 50 MCG tablet Take 1 tablet (50 mcg total) by mouth daily before breakfast.  . LORazepam (ATIVAN) 0.5 MG tablet Take 1 tablet (0.5 mg total) by mouth every 8 (eight) hours as needed for anxiety (anxiety,SOB).  . magnesium oxide (MAG-OX) 400 MG tablet Take 0.5 tablets (200 mg total) by mouth 2 (two) times daily.  . midodrine (PROAMATINE) 10 MG tablet Take 1 tablet (10 mg total) by mouth 2 (two) times daily with a meal.  . ondansetron (ZOFRAN)  4 MG tablet Take 1 tablet (4 mg total) by mouth 2 (two) times daily as needed for nausea or vomiting.  . pantoprazole (PROTONIX) 40 MG tablet Take 1 tablet (40 mg total) by mouth daily.  . polyethylene glycol (MIRALAX / GLYCOLAX) 17 g packet Take 17 g by mouth 2 (two) times daily.  . potassium chloride SA (KLOR-CON) 20 MEQ tablet Take 2 tablets (40 mEq total) by mouth 2 (two) times daily.  . Rivaroxaban (XARELTO) 15 MG TABS tablet Take 1 tablet (15  mg total) by mouth daily with supper.  Mar[place] Kitchen rOPINIRole (REQUIP) 0.5 MG tablet Take 1 tablet (0.5 mg total) by mouth daily at 8 pm.  . sennosides-docusate sodium (SENOKOT-S) 8.6-50 MG tablet Take 1 tablet by mouth daily as needed for constipation.  Mar[place] Kitchen zolpidem (AMBIEN) 5 MG tablet Take 1 tablet (5 mg total) by mouth at bedtime.   No facility-administered encounter medications on file as of 11/05/2020.    ROS  General: NAD, endorses weakness EYES: denies vision changes ENMT: denies dysphagia no xerostomia Cardiovascular: denies chest pain Pulmonary: denies  cough, denies SOB  Abdomen: endorses poor to fair appetite, no constipation or diarrhea GU: denies dysuria or urinary frquency MSK:  endorses ROM limitations, no falls reported Skin: denies rashes or wounds Neurological: endorses weakness, denies pain, denies insomnia Psych: No suicidal ideation Heme/lymph/immuno: denies bruises, abnormal bleeding   PHYSICAL EXAM  Height  5 feet 5 inches Weight 117 Ibs down from 130 Ibs a year ago BMI 19.47 General: In no acute distress, frail Cardiovascular: regular rate and rhythm, no edema.  Milrinone pump infusion in place Pulmonary: no cough, no increased work of breathing, normal respiratory effort on room air Abdomen: soft, non tender, positive bowel sounds in all quadrants GU:  no suprapubic tenderness Eyes: Normal lids, no discharge, sclera anicteric ENMT: Moist mucous membranes Musculoskeletal: Moderate sarcopenia Skin: no rash to visible skin, warm without cyanosis Psych: None anxious affect Neurological: Weakness but otherwise non focal Heme/lymph/immuno: no bruises, no bleeding  Thank you for the opportunity to participate in the care of [place] Please call our office at [phone] if we can be of additional assistance.  Note: Portions of this note were generated with [employer]. Dictation errors may occur despite best attempts at proofreading.  Teodoro Spray, NP

## 2020-11-06 DIAGNOSIS — I5043 Acute on chronic combined systolic (congestive) and diastolic (congestive) heart failure: Secondary | ICD-10-CM | POA: Diagnosis not present

## 2020-11-06 DIAGNOSIS — I428 Other cardiomyopathies: Secondary | ICD-10-CM | POA: Diagnosis not present

## 2020-11-06 DIAGNOSIS — I5023 Acute on chronic systolic (congestive) heart failure: Secondary | ICD-10-CM | POA: Diagnosis not present

## 2020-11-08 ENCOUNTER — Ambulatory Visit (INDEPENDENT_AMBULATORY_CARE_PROVIDER_SITE_OTHER): Payer: PPO

## 2020-11-08 DIAGNOSIS — Z95810 Presence of automatic (implantable) cardiac defibrillator: Secondary | ICD-10-CM

## 2020-11-08 DIAGNOSIS — I5022 Chronic systolic (congestive) heart failure: Secondary | ICD-10-CM

## 2020-11-08 NOTE — Progress Notes (Signed)
EPIC Encounter for ICM Monitoring  Patient Name: Cheryl Vargas is a 86 y.o. female Date: 11/08/2020 Primary Care Physican: Lavone Orn, MD Primary Cardiologist:[facility] Electrophysiologist:[facility] Bi-V Pacing:98.5% 2/28/2022Weight: 116lbs    Spoke with patient and reports feeling really tired after recent hospitalization.  She feels weak and has very little strength.    Hospitalization 10/22/2020  OptivolThoracic impedancesuggesting possible fluid accumulation starting2/19/2022 but starting to trend back toward baseline.  Prescribed:   Furosemide20 mg take3tablet (60 mg total)by mouthevery AM and 40 mg every evening.  Potassium 20 mEq take 2 tablets 1 tablet daily.  Labs: 10/26/2020 Creatinine 1.67, BUN 40, Potassium 3.7, Sodium 134, GFR 30 10/25/2020 Creatinine 1.55, BUN 42, Potassium 5.3, Sodium 133, GFR 33  10/24/2020 Creatinine 1.70, BUN 45, Potassium 4.8, Sodium 135, GFR 30  10/23/2020 Creatinine 1.95, BUN 48, Potassium 3.8, Sodium 134, GFR 25  10/22/2020 Creatinine 2.29, BUN 50, Potassium 4.1, Sodium 135, GFR 21  A complete set of results can be found in Results Review.  Recommendations: No changes and encouraged to call if experiencing any fluid symptoms.  Pt has OV with HF clinic 11/12/2020.  Follow-up plan: ICM clinic phone appointment on3/04/2021. 91 day device clinic remote transmission3/30/2022.   EP/Cardiology Office Visits: 11/12/2020 with HF clinic PA/NP.  Apr 13, 2022with Dr Aundra Dubin.10/29/2020 with Dr Rayann Heman was canceled  Copy of ICM check sent to Dr.Allred.  3 month ICM trend: 11/08/2020.    1 Year ICM trend:       Rosalene Billings, RN 11/08/2020 2:24 PM

## 2020-11-09 ENCOUNTER — Telehealth (HOSPITAL_COMMUNITY): Payer: Self-pay | Admitting: Cardiology

## 2020-11-09 MED ORDER — POTASSIUM CHLORIDE CRYS ER 20 MEQ PO TBCR: 40.0000 meq | EXTENDED_RELEASE_TABLET | Freq: Two times a day (BID) | ORAL | 3 refills | Status: AC

## 2020-11-09 NOTE — Telephone Encounter (Signed)
Refill sent to [facility]. ?

## 2020-11-09 NOTE — Telephone Encounter (Signed)
*  STAT* If patient is at the pharmacy, call can be transferred to refill team.   1. Which medications need to be refilled? (please list name of each medication and dose if known)  potassium chloride SA (KLOR-CON) 20 MEQ tablet  2. Which pharmacy/location (including street and city if local pharmacy) is medication to be sent to? [facility] - [place], [place] - [place] [address]   3. Do they need a 30 day or 90 day supply? 90 with refills  Patient needs medication to go to [facility] not [facility]. [facility] will deliver meds directly to the patient

## 2020-11-10 DIAGNOSIS — I5042 Chronic combined systolic (congestive) and diastolic (congestive) heart failure: Secondary | ICD-10-CM | POA: Diagnosis not present

## 2020-11-10 DIAGNOSIS — Z9181 History of falling: Secondary | ICD-10-CM | POA: Diagnosis not present

## 2020-11-10 DIAGNOSIS — Z7901 Long term (current) use of anticoagulants: Secondary | ICD-10-CM | POA: Diagnosis not present

## 2020-11-10 DIAGNOSIS — I5043 Acute on chronic combined systolic (congestive) and diastolic (congestive) heart failure: Secondary | ICD-10-CM | POA: Diagnosis not present

## 2020-11-10 DIAGNOSIS — I340 Nonrheumatic mitral (valve) insufficiency: Secondary | ICD-10-CM | POA: Diagnosis not present

## 2020-11-10 DIAGNOSIS — Z79899 Other long term (current) drug therapy: Secondary | ICD-10-CM | POA: Diagnosis not present

## 2020-11-10 DIAGNOSIS — Z96642 Presence of left artificial hip joint: Secondary | ICD-10-CM | POA: Diagnosis not present

## 2020-11-10 DIAGNOSIS — M1990 Unspecified osteoarthritis, unspecified site: Secondary | ICD-10-CM | POA: Diagnosis not present

## 2020-11-10 DIAGNOSIS — I4819 Other persistent atrial fibrillation: Secondary | ICD-10-CM | POA: Diagnosis not present

## 2020-11-10 DIAGNOSIS — I428 Other cardiomyopathies: Secondary | ICD-10-CM | POA: Diagnosis not present

## 2020-11-10 DIAGNOSIS — Z95810 Presence of automatic (implantable) cardiac defibrillator: Secondary | ICD-10-CM | POA: Diagnosis not present

## 2020-11-10 DIAGNOSIS — Z452 Encounter for adjustment and management of vascular access device: Secondary | ICD-10-CM | POA: Diagnosis not present

## 2020-11-10 DIAGNOSIS — I5023 Acute on chronic systolic (congestive) heart failure: Secondary | ICD-10-CM | POA: Diagnosis not present

## 2020-11-10 DIAGNOSIS — N1831 Chronic kidney disease, stage 3a: Secondary | ICD-10-CM | POA: Diagnosis not present

## 2020-11-10 DIAGNOSIS — I130 Hypertensive heart and chronic kidney disease with heart failure and stage 1 through stage 4 chronic kidney disease, or unspecified chronic kidney disease: Secondary | ICD-10-CM | POA: Diagnosis not present

## 2020-11-10 DIAGNOSIS — E785 Hyperlipidemia, unspecified: Secondary | ICD-10-CM | POA: Diagnosis not present

## 2020-11-10 DIAGNOSIS — I472 Ventricular tachycardia: Secondary | ICD-10-CM | POA: Diagnosis not present

## 2020-11-11 DIAGNOSIS — Z7189 Other specified counseling: Secondary | ICD-10-CM | POA: Diagnosis not present

## 2020-11-11 DIAGNOSIS — J3489 Other specified disorders of nose and nasal sinuses: Secondary | ICD-10-CM | POA: Diagnosis not present

## 2020-11-11 DIAGNOSIS — I5042 Chronic combined systolic (congestive) and diastolic (congestive) heart failure: Secondary | ICD-10-CM | POA: Diagnosis not present

## 2020-11-11 DIAGNOSIS — G2581 Restless legs syndrome: Secondary | ICD-10-CM | POA: Diagnosis not present

## 2020-11-11 DIAGNOSIS — E039 Hypothyroidism, unspecified: Secondary | ICD-10-CM | POA: Diagnosis not present

## 2020-11-11 DIAGNOSIS — R110 Nausea: Secondary | ICD-10-CM | POA: Diagnosis not present

## 2020-11-11 NOTE — Progress Notes (Signed)
Advanced Heart Failure Clinic Note    PCP:  Lavone Orn, MD  Cardiologist:  [name], MD HF Cardiology: Dr. Aundra Dubin  Chief Complaint: Hospital follow up for CHF   HPI: Patient has a past medical history of HTN, HLD, PAF (On Xarelto), and nonischemic cardiomyopathy with chronic systolic CHF. Cardiomyopathy dates back to at least 2014 based on echoes (EF 30-35% in 2014).   Admitted 8/17 for fatigue and dyspnea.  She was seen by electrophysiology on 04/17/16, and was started on po Amiodarone as she reported increased palpitations and fatigue with frequent PVCs. Her BNP was elevated at 1592.6 and chest x ray was consistent with CHF.  She was diuresed.  With her complaints of new onset fatigue and dyspnea combined with her known LV dysfunction, it was felt that she would benefit from an ischemic evaluation by cath. She underwent left heart cath with normal cors. Amiodarone was later stopped due to nausea.  Discharge weight 143 pounds. Echo (8/17) with EF 40-45%, inferoseptal akinesis. Cardiac MRI 10/17 with EF 36%, LGE mid wall pattern in the basal to mid septum and inferior wall. Holter monitor in 9/17 with runs of atrial fibrillation/RVR.  She has had trouble tolerating cardiac meds due to hypotension/lightheadedness.    She has had difficulty with symptomatic atrial fibrillation. She developed symptomatic atrial fibrillation with RVR again in 10/18, difficult to control rate.  She had TEE-guided DCCV with resumption of NSR. On TEE in rapid atrial fibrillation, EF 20-25%.  On TTE after DCCV, EF back to 40-45% range.  MR reported as moderate to severe but I reviewed echo and think it appears more in the moderate range.    She had recurrent atrial fibrillation after 10/18 DCCV. She was admitted in 12/18 with atrial fibrillation and RVR, she also had a run of WCT thought to be VT.   The atrial fibrillation was very difficult to control.  She was thought to be a poor candidate for atrial  fibrillation ablation.  She finally had AV nodal ablation with Medtronic CRT-D device in 12/18. She was then admitted briefly with CHF in 1/19.  Echo in 12/18 showed EF 25-30%, moderate MR, moderate TR, severe LAE.  Echo in 4/19 showed EF 25-30%, diffuse hypokinesis, mild MR, moderately decreased RV systolic function.   Admitted 4/7 - 12/21/17 with A/C CHF. Found to have decreased BiV pacing in setting of frequent PVCs. Tried on Mexiletine but failed due to nausea. Switched to Ranexa. Diuresed with IV lasix.   In 5/19, she was admitted with a near-syncopal episode that occurred while straining for a bowel movement.  Suspected vagal event.   She has stopped ranolazine due to nausea.  She did not tolerate increase in bisoprolol.   She was admitted in 8/20 with presyncope; she would get abdominal pain followed by a feeling of warmth and diaphoresis, then she would get dizzy.  No syncope. No explanatory arrhythmias were seen on device interrogation or telemetry in the hospital.  She was not orthostatic or dry.  Thought to be possibly a vagal response to GI trigger (dyspepsia).  Mesenteric dopplers in 8/20 were normal. She feels like Pepcid has helped.   She was admitted in 1/21 with VT and ICD discharge.  RHC showed low output with CI 1.72.  Echo showed EF < 20%, moderate LV dilation, mild RV dilation with decreased systolic function, mild-moderate MR.  She was started on amiodarone and digoxin.   She was admitted in 5/21 with UTI and hypotension.  Losartan  and spironolactone were stopped.    Echo in 12/21 showed EF 20-25% with severe MR, severe TR, and moderate AI.  Patient had [place] in 12/21 due to concern for low output HF.  RHC did show low cardiac output, but this was complicated by localized branch PA perforation requiring coil embolization by IR.  She was started on milrinone with improvement in symptoms though was still very weak.  Given frailty, she was thought to be poor candidate for LVAD  placement.  We sent her home on milrinone with home health with plan to transition to hospice with any worsening.   Telehealth visit (12/21) she remained on milrinone 0.25 with HH and home PT following.  Taking Ensure 1-2 times/day.  Using rollator to get around the house without much dypsnea but she continues to fatigue easily.  She is back on amiodarone for frequent PVCs.    In January Optivol was suggestive of fluid accumulation. On 2/1 Dr Aundra Dubin recommended increasing lasix to 40 mg twice a day x 5 days then back to lasix 40 mg/20 mg. She canceled appointment with Dr Aundra Dubin on 10/14/20.  On 2/7 suspected UTI. Prescribed Cipro but never started due to allergy.   Presented to [facility] (10/22/20) with fatigue and leg weakness. CT head negative. Creatinine on admit 2.3. Cardiology consulted and she was given IV lasix x1 then diuretics stopped and IV fluids started. CT abd- sigmoid diverticulosis, small hh, and stable cyst appearing areas liver/R kidney. Remains on milrinone 0.25 mcg. Prior to admit she had 9 falls. Milrinone increased to 0.5 mcg, diuretics changed to PO. She was discharged with [facility] planning to continue milrinone when she is ready for hospice care.   Today she returns for hospital follow up. Overall feeling fine, has good days and bad days. Denies increasing SOB, CP, dizziness, edema, or PND/Orthopnea. Appetite ok. No fever or chills. Weight at home 121 pounds. Taking all medications. Has not taken any extra lasix. Has been craving milk and drinking more of that lately, but thinks her daily fluid intake is <2L.  Labs (8/17): K 4.2, creatinine 0.85, BNP 1593, HCT 43.2, TSH mild elevated 5.3, free T4 normal Labs (9/17): K 4.1, creatinine 0.98 => 0.92, BNP 258, SPEP negative Labs (2/18): K 4, creatinine 0.83, HCT 40.8 Labs (10/18): K 4.2, creatinine 0.94, hgb 14.9 Labs (1/19): K 4.9, creatinine 0.72 Labs (2/19): K 4.1, creatinine 0.97 Labs (5/19): K 3.4, creatinine 1.17 Labs (6/19): K  3.9, creatinine 1.2 Labs (8/19): K 3.7, creatinine 1.14 Labs (1/20): K 3.9, creatinine 1.25 Labs (8/20): K 4.1, creatinine 1.0, hgb 14.4 Labs (1/21): digoxin <0.2, K 3.6, creatinine 1.36 Labs (9/21): digoxin 0.5, K 4.2, creatinine 1.06, hb 15.9 Labs (12/21): K 3.9, creatinine 1.06, hgb 12.4 Labs (2/22): K 3.8, creatinine 1.27 Labs (3/22):   1. Chronic systolic CHF:  Nonischemic cardiomyopathy.   - Echo (2014): EF 30-35%. - Echo (2/17): EF 40-45% - Echo (8/17): EF 40-45%, mid to apical inferoseptal akinesis - Coronary angiography (8/17): No significant CAD.  - Cardiac MRI (10/17): EF 36%, moderate MR, normal RV size and systolic function, mid-wall LGE in the basal to mid septum and inferior wall.  - TEE (10/18): EF 20-25% but in rapid afib with HR up to 150 bpm, mild MR.  - Echo (10/18): EF 40-45%, diffuse hypokinesis, moderate LAE, reportedly moderate-severe MR (looks moderate on my review).  - Echo (12/18): EF 25-30%, moderate MR, moderate TR, severe LAE - Medtronic CRT-D device s/p AV nodal ablation.  -  Echo (4/19): EF 25-30%, diffuse hypokinesis, mild MR, moderately decreased RV systolic function.  - Echo (8/20): EF 25-30%, normal RV size and systolic function, mild MR, moderate-severe TR.  - RHC (1/21): mean RA 9, PA 39/19, mean PCWP 22, CI 1.72, PVR 2.4 WU - Echo (1/21): EF <20%, moderate LV dilation, mild RV dilation with mildly decreased systolic function, mild-moderate MR.  - Echo (12/21): EF 20-25%, severe biatrial enlargement, severe MR, severe TR, moderate AI.  - RHC (12/21): mean RA 6, PA 41/16, PCWP 19, CI 1.8 - Now on home milrinone for end stage HF.  2. Atrial fibrillation: Paroxysmal.  Diagnosed 2/17.  Unable to tolerate amiodarone.  - Holter (9/17): atrial fibrillation with RVR runs, 5% PVCs.  - AV nodal ablation with BiV pacing in 12/18.  - Nausea with ranolazine.  3. Hyperlipidemia 4. PVCs: frequent.  5. Long QT interval 6. Mesenteric dopplers (8/20): normal.   7. VT in 1/21 with ICD discharge.   Review of systems complete and found to be negative unless listed in HPI.    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 Occasional ETOH, never heavy.   Current Outpatient Medications  Medication Sig Dispense Refill  . acetaminophen (TYLENOL) 500 MG tablet Take 500-1,000 mg by mouth every 6 (six) hours as needed for headache (pain).    Marland Kitchen amiodarone (PACERONE) 200 MG tablet Take 1 tablet (200 mg total) by mouth daily. 30 tablet 6  . Calcium Carbonate Antacid (TUMS ULTRA 1000 PO) Take 1,000 mg by mouth 2 (two) times daily as needed (heartburn/indigestion).    . fluticasone (FLONASE) 50 MCG/ACT nasal spray Place 1 spray into both nostrils daily as needed for allergies or rhinitis (seasonal allergies).    . furosemide (LASIX) 20 MG tablet Take 3 tablets (60 mg total) by mouth daily with breakfast. 30 tablet 2  . furosemide (LASIX) 40 MG tablet Take 1 tablet (40 mg total) by mouth every evening. 30 tablet 2  . levothyroxine (SYNTHROID) 50 MCG tablet Take 1 tablet (50 mcg total) by mouth daily before breakfast. 30 tablet 0  . LORazepam (ATIVAN) 0.5 MG tablet Take 1 tablet (0.5 mg total) by mouth every 8 (eight) hours as needed for anxiety (anxiety,SOB). 30 tablet 0  . magnesium oxide (MAG-OX) 400 MG tablet Take 0.5 tablets (200 mg total) by mouth 2 (two) times daily. 10 tablet 0  . midodrine (PROAMATINE) 10 MG tablet Take 1 tablet (10 mg total) by mouth 2 (two) times daily with a meal. 60 tablet 2  . ondansetron (ZOFRAN) 4 MG tablet Take 1 tablet (4 mg total) by mouth 2 (two) times daily as needed for nausea or vomiting. 30 tablet 1  . pantoprazole (PROTONIX) 40 MG tablet Take 1 tablet (40 mg total) by mouth daily. 30 tablet 6  . polyethylene glycol (MIRALAX / GLYCOLAX) 17 g packet Take 17 g by mouth 2 (two) times daily. 14 each 0  . potassium chloride SA (KLOR-CON) 20 MEQ tablet Take 2 tablets (40  mEq total) by mouth 2 (two) times daily. 120 tablet 3  . Rivaroxaban (XARELTO) 15 MG TABS tablet Take 1 tablet (15 mg total) by mouth daily with supper. 30 tablet 11  . rOPINIRole (REQUIP) 0.5 MG tablet Take 1 tablet (0.5 mg total) by mouth daily at 8 pm. 30 tablet 0  . sennosides-docusate sodium (SENOKOT-S) 8.6-50 MG tablet Take 1 tablet by mouth daily as needed for constipation.    Marland Kitchen  zolpidem (AMBIEN) 5 MG tablet Take 1 tablet (5 mg total) by mouth at bedtime. 15 tablet 0   No current facility-administered medications for this encounter.   Vitals:   11/12/20 1059  BP: 123/74  Pulse: 72  SpO2: 98%  Weight: 56.8 kg (125 lb 3.2 oz)   Wt Readings from Last 3 Encounters:  11/12/20 56.8 kg (125 lb 3.2 oz)  10/30/20 53 kg (116 lb 13.5 oz)  09/08/20 52.6 kg (116 lb)    Physical Exam: General:  NAD. No resp difficulty, arrived in wheelchair, thin HEENT: Normal Neck: Supple. No JVD. Carotids 2+ bilat; no bruits. No lymphadenopathy or thryomegaly appreciated. Cor: PMI nondisplaced. Regular rate & rhythm. No rubs, gallops, II/VI HSM apex Lungs: Clear, port right upper chest. Dressing C/D/I Abdomen: Soft, nontender, nondistended. No hepatosplenomegaly. No bruits or masses. Good bowel sounds. Extremities: No cyanosis, clubbing, rash, edema Neuro: alert & oriented x 3, cranial nerves grossly intact. Moves all 4 extremities w/o difficulty. Affect pleasant.  ICD interrogation: OptiVol below threshold, but trending up, thoracic impedence just below threshold, no VT/VF, >99% BiV pacing, less than 20 mins/day of activity (personally reviewed).  ASSESSMENT & PLAN: 1.Chronic systolic HF: [place] 03/622 showed no significant coronary disease. Cardiac MRI 10/17 EF 36%, diffuse HK, normal RV, biatrial enlargement, moderate MR, non-coronary LGE pattern involving the mid-wall of the basal to mid septum and inferior wall. Possibly prior myocarditis versus a form of infiltrative disease. Medtronic CRT-D  s/p AV nodal ablation. Echo in 1/21 showed EF <20% with moderate LV dilation and mildly decreased RV systolic function. Echo in 12/21 with EF 20-25%, severe MR, severe TR, moderate AI.Low output on 1/21 RHC (CI 1.72) and 12/21 RHC (CI 1.8). NYHA class IIIb symptoms. End stage HF now with home milrinone.Admitted with failure to thrive, fatigue, AKI, persistent nausea. I think that these symptoms signify worsening of her HF.  We have increased milrinone to 0.5 mcg/kg/min and have diuresed with IV Lasix, now back to po. Today, volume looks ok, but trending up on OptiVol. NYHA IIIb symptoms. - Continue milrinone 0.5 at home, this is maximum dose. BMET pending. - Continue lasix 60 mg AM/40 mg PM. Will ask device nurse to do another interrogation in 7-10 days to make sure fluid leveled off. - Continue KCl 40 mEq bid.  - She is off losartan, bisoprololand spironolactone with symptomatic hypotension and now is on midodrine 10 mg bid. - Off digoxin with elevated level. - Low output on 1/21and 12/21RHCs. Given her age and frailty,poor candidate for LVAD.End stage CHF now with marked fatigue. She has progressive heart failure.She is a DNR, reasonable to move onto full hospice ([facility] to allow continued milrinone) when she is ready.  2. Atrial fibrillation: Chronic, s/p AV nodal ablation and BiV pacing.  - Continue Xarelto 15 mg daily. No bleeding issues. 3. PVCs/VT: Frequent PVCs have limited BiV pacing. Not a good PVC ablation candidate per EP. Had VT in 1/21. Cannot tolerate sotalol orTikosyn. Did not tolerate mexilitine with nausea. Had nausea also with ranolazine. She had nausea in the past with amiodarone but seems to be tolerating it now. -Continue amiodarone 200 mg daily for PVC suppression to try to promote BiV pacing.She needs regular eye exams. 4. Mitral regurgitation:Severe on 12/21 echo, not candidate for intervention. 5. Hypothyroidism: On Levoxyl. Per PCP.  Follow back  with Dr. Aundra Dubin as scheduled.  Signed, Rafael Bihari, FNP-BC  11/12/2020  [facility] [address] [facility]  [place] [address] [phone] (office) [phone] (fax)

## 2020-11-12 ENCOUNTER — Encounter (HOSPITAL_COMMUNITY): Payer: Self-pay

## 2020-11-12 ENCOUNTER — Ambulatory Visit (HOSPITAL_COMMUNITY)
Admit: 2020-11-12 | Discharge: 2020-11-12 | Disposition: A | Discharge status: Home / Self Care | Payer: PPO | Attending: Family Medicine | Admitting: Family Medicine

## 2020-11-12 ENCOUNTER — Other Ambulatory Visit: Payer: Self-pay

## 2020-11-12 VITALS — BP 123/74 | HR 72 | Wt 125.2 lb

## 2020-11-12 DIAGNOSIS — Z66 Do not resuscitate: Secondary | ICD-10-CM | POA: Insufficient documentation

## 2020-11-12 DIAGNOSIS — Z79890 Hormone replacement therapy (postmenopausal): Secondary | ICD-10-CM | POA: Diagnosis not present

## 2020-11-12 DIAGNOSIS — I340 Nonrheumatic mitral (valve) insufficiency: Secondary | ICD-10-CM | POA: Insufficient documentation

## 2020-11-12 DIAGNOSIS — I5022 Chronic systolic (congestive) heart failure: Secondary | ICD-10-CM | POA: Insufficient documentation

## 2020-11-12 DIAGNOSIS — I5084 End stage heart failure: Secondary | ICD-10-CM | POA: Insufficient documentation

## 2020-11-12 DIAGNOSIS — I428 Other cardiomyopathies: Secondary | ICD-10-CM | POA: Diagnosis not present

## 2020-11-12 DIAGNOSIS — E039 Hypothyroidism, unspecified: Secondary | ICD-10-CM

## 2020-11-12 DIAGNOSIS — I110 Hypertensive heart disease with heart failure: Secondary | ICD-10-CM | POA: Insufficient documentation

## 2020-11-12 DIAGNOSIS — I482 Chronic atrial fibrillation, unspecified: Secondary | ICD-10-CM | POA: Diagnosis not present

## 2020-11-12 DIAGNOSIS — I493 Ventricular premature depolarization: Secondary | ICD-10-CM | POA: Diagnosis not present

## 2020-11-12 DIAGNOSIS — I4821 Permanent atrial fibrillation: Secondary | ICD-10-CM | POA: Diagnosis not present

## 2020-11-12 DIAGNOSIS — Z79899 Other long term (current) drug therapy: Secondary | ICD-10-CM | POA: Diagnosis not present

## 2020-11-12 DIAGNOSIS — Z7901 Long term (current) use of anticoagulants: Secondary | ICD-10-CM | POA: Insufficient documentation

## 2020-11-12 DIAGNOSIS — E785 Hyperlipidemia, unspecified: Secondary | ICD-10-CM | POA: Insufficient documentation

## 2020-11-12 NOTE — Progress Notes (Signed)
OptiVol Received: Today [place], Maricela Bo, FNP  Orianna Biskup Panda, RN HI Margarita Grizzle,  Just saw [place] in clinic today, she looks good, but I see her fluid is trending back up. Can you do another device transmission in like 7-10 days and send to Dr. Aundra Dubin .  Thank you!  Janett Billow

## 2020-11-12 NOTE — Patient Instructions (Signed)
It was great to see you today! No medication changes are needed at this time.  Keep follow up as scheduled  At the [facility], you and your health needs are our priority. As part of our continuing mission to provide you with exceptional heart care, we have created designated Provider Care Teams. These Care Teams include your primary Cardiologist (physician) and Advanced Practice Providers (APPs- Physician Assistants and Nurse Practitioners) who all work together to provide you with the care you need, when you need it.   You may see any of the following providers on your designated Care Team at your next follow up: Marland Kitchen Dr Glori Bickers . Dr [name] . Dr Vickki Muff . Darrick Grinder, NP . Lyda Jester, [place] . Audry Riles, PharmD   Please be sure to bring in all your medications bottles to every appointment.   Do the following things EVERYDAY: 1) Weigh yourself in the morning before breakfast. Write it down and keep it in a log. 2) Take your medicines as prescribed 3) Eat low salt foods-Limit salt (sodium) to 2000 mg per day.  4) Stay as active as you can everyday 5) Limit all fluids for the day to less than 2 liters If you have any questions or concerns before your next appointment please send [place] a message through [place] or call our office at [phone].    TO LEAVE A MESSAGE FOR THE NURSE SELECT OPTION 2, PLEASE LEAVE A MESSAGE INCLUDING: . YOUR NAME . DATE OF BIRTH . CALL BACK NUMBER . REASON FOR CALL**this is important as we prioritize the call backs  YOU WILL RECEIVE A CALL BACK THE SAME DAY AS LONG AS YOU CALL BEFORE 4:00 PM Please see our updated No Show and Same Day Appointment Cancellation Policy attached to your AVS.

## 2020-11-12 NOTE — Progress Notes (Signed)
ICM remote transmission rescheduled from 11/16/2020 to 11/22/2020 at HF clinic request.

## 2020-11-14 DIAGNOSIS — I5043 Acute on chronic combined systolic (congestive) and diastolic (congestive) heart failure: Secondary | ICD-10-CM | POA: Diagnosis not present

## 2020-11-14 DIAGNOSIS — I428 Other cardiomyopathies: Secondary | ICD-10-CM | POA: Diagnosis not present

## 2020-11-14 DIAGNOSIS — I5023 Acute on chronic systolic (congestive) heart failure: Secondary | ICD-10-CM | POA: Diagnosis not present

## 2020-11-16 ENCOUNTER — Telehealth: Payer: Self-pay

## 2020-11-16 DIAGNOSIS — I5043 Acute on chronic combined systolic (congestive) and diastolic (congestive) heart failure: Secondary | ICD-10-CM | POA: Diagnosis not present

## 2020-11-16 DIAGNOSIS — I5022 Chronic systolic (congestive) heart failure: Secondary | ICD-10-CM

## 2020-11-16 DIAGNOSIS — I5023 Acute on chronic systolic (congestive) heart failure: Secondary | ICD-10-CM | POA: Diagnosis not present

## 2020-11-16 DIAGNOSIS — I428 Other cardiomyopathies: Secondary | ICD-10-CM | POA: Diagnosis not present

## 2020-11-16 NOTE — Telephone Encounter (Signed)
Returned call to pt per voice mail request.   Pt transmitted remote report today, 3/8 because she is symptomatic of 2 lb weight gain and swelling of feet within the last 2 days.  Pt last seen in HF clinic on 3/4 by Allena Katz, NP and asked for 7-10 day remote transmission recheck.     Patient eating restaurant foods because she does not feel like cooking and children are too busy to cook for her.  Pt has been educated on limiting fluid and salt intake at previous ICM calls.     OptivolThoracic impedancesuggesting possible fluid accumulation worse since 3/4 HF clinic OV.  Prescribed:   Furosemide20 mg take3tablet (60 mg total)by mouthevery AM and 40 mg every evening.  Potassium 20 mEq take 2 tablets 1 tablet daily.  Labs: 10/26/2020 Creatinine1.67, BUN40, Potassium3.7, Sodium134, GFR30 10/25/2020 Creatinine1.55, BUN42, Potassium5.3, Sodium133, BBU03  10/24/2020 Creatinine1.70, BUN45, Potassium4.8, Sodium135, GFR30  02/12/2022Creatinine 1.95, BUN48, Potassium3.8, Sodium134, GFR25  02/11/2022Creatinine 2.29, BUN50, Potassium4.1, Sodium135, JQD64 A complete set of results can be found in Results Review.   Copy sent to Allena Katz, NP for review and recommendations since she last saw patient on 11/12/2020.     3 month ICM trend: 11/16/2020

## 2020-11-17 DIAGNOSIS — I472 Ventricular tachycardia: Secondary | ICD-10-CM | POA: Diagnosis not present

## 2020-11-17 DIAGNOSIS — I4819 Other persistent atrial fibrillation: Secondary | ICD-10-CM | POA: Diagnosis not present

## 2020-11-17 DIAGNOSIS — I428 Other cardiomyopathies: Secondary | ICD-10-CM | POA: Diagnosis not present

## 2020-11-17 DIAGNOSIS — Z95810 Presence of automatic (implantable) cardiac defibrillator: Secondary | ICD-10-CM | POA: Diagnosis not present

## 2020-11-17 DIAGNOSIS — Z452 Encounter for adjustment and management of vascular access device: Secondary | ICD-10-CM | POA: Diagnosis not present

## 2020-11-17 DIAGNOSIS — Z79899 Other long term (current) drug therapy: Secondary | ICD-10-CM | POA: Diagnosis not present

## 2020-11-17 DIAGNOSIS — Z9181 History of falling: Secondary | ICD-10-CM | POA: Diagnosis not present

## 2020-11-17 DIAGNOSIS — I340 Nonrheumatic mitral (valve) insufficiency: Secondary | ICD-10-CM | POA: Diagnosis not present

## 2020-11-17 DIAGNOSIS — N1831 Chronic kidney disease, stage 3a: Secondary | ICD-10-CM | POA: Diagnosis not present

## 2020-11-17 DIAGNOSIS — I5023 Acute on chronic systolic (congestive) heart failure: Secondary | ICD-10-CM | POA: Diagnosis not present

## 2020-11-17 DIAGNOSIS — Z7901 Long term (current) use of anticoagulants: Secondary | ICD-10-CM | POA: Diagnosis not present

## 2020-11-17 DIAGNOSIS — I5042 Chronic combined systolic (congestive) and diastolic (congestive) heart failure: Secondary | ICD-10-CM | POA: Diagnosis not present

## 2020-11-17 DIAGNOSIS — Z96642 Presence of left artificial hip joint: Secondary | ICD-10-CM | POA: Diagnosis not present

## 2020-11-17 DIAGNOSIS — I130 Hypertensive heart and chronic kidney disease with heart failure and stage 1 through stage 4 chronic kidney disease, or unspecified chronic kidney disease: Secondary | ICD-10-CM | POA: Diagnosis not present

## 2020-11-17 DIAGNOSIS — E785 Hyperlipidemia, unspecified: Secondary | ICD-10-CM | POA: Diagnosis not present

## 2020-11-17 DIAGNOSIS — I5043 Acute on chronic combined systolic (congestive) and diastolic (congestive) heart failure: Secondary | ICD-10-CM | POA: Diagnosis not present

## 2020-11-17 DIAGNOSIS — M1990 Unspecified osteoarthritis, unspecified site: Secondary | ICD-10-CM | POA: Diagnosis not present

## 2020-11-17 NOTE — Telephone Encounter (Signed)
Spoke with patient.  Confirmed patient has been taking Furosemide 60 mg every morning and 40 mg every evening as indicated in [place] 11/12/2020 OV note.  Advised Cheryl Vargas has increased Furosemide dosage to 60 mg every morning and 60 mg every evening for next 3 days and she will start the increased dosage this evening.  Advised after 3rd day to return to the prior dosage of 60/40.  She verbalized understanding and repeated instructions back correctly.  She agreed to have labs drawn on 11/23/2020 at 11:30 AM at HF clinic.   Will recheck remote transmission fluid levels on 11/22/2020.

## 2020-11-17 NOTE — Telephone Encounter (Signed)
Fluid still trending up on OptiVol, let's have her increase her lasix to 60 mg bid x 3 days. Will need repeat BMET in 1 week.

## 2020-11-17 NOTE — Addendum Note (Signed)
Addended by: Rosalene Billings on: 11/17/2020 10:45 AM   Modules accepted: Orders

## 2020-11-18 ENCOUNTER — Other Ambulatory Visit (HOSPITAL_COMMUNITY): Payer: Self-pay | Admitting: Cardiology

## 2020-11-21 DIAGNOSIS — I5023 Acute on chronic systolic (congestive) heart failure: Secondary | ICD-10-CM | POA: Diagnosis not present

## 2020-11-21 DIAGNOSIS — I5043 Acute on chronic combined systolic (congestive) and diastolic (congestive) heart failure: Secondary | ICD-10-CM | POA: Diagnosis not present

## 2020-11-21 DIAGNOSIS — I428 Other cardiomyopathies: Secondary | ICD-10-CM | POA: Diagnosis not present

## 2020-11-22 ENCOUNTER — Ambulatory Visit (INDEPENDENT_AMBULATORY_CARE_PROVIDER_SITE_OTHER): Payer: PPO

## 2020-11-22 DIAGNOSIS — I5022 Chronic systolic (congestive) heart failure: Secondary | ICD-10-CM | POA: Diagnosis not present

## 2020-11-22 DIAGNOSIS — Z95810 Presence of automatic (implantable) cardiac defibrillator: Secondary | ICD-10-CM | POA: Diagnosis not present

## 2020-11-22 NOTE — Progress Notes (Signed)
Increase Lasix to 80 qam/ 60 qpm x 3 days then continue Lasix at 60 mg bid. BMET 7 days.

## 2020-11-22 NOTE — Progress Notes (Signed)
EPIC Encounter for ICM Monitoring  Patient Name: Cheryl Vargas is a 86 y.o. female Date: 11/22/2020 Primary Care Physican: Lavone Orn, MD Primary Cardiologist:[place] Electrophysiologist:Allred Bi-V Pacing:98.5% 3/14/2022Weight: 124lbs    Spoke with patient and generally does not feel well.  Weight up 2 lbs in the last 2 weeks.   OptivolThoracic impedancesuggesting possible fluid accumulation starting2/19/2022 and getting worse after taking Furosemide 60 mg bid x 3 days.  Prescribed:   Furosemide20 mg take3tablet (60 mg total)by mouthevery AM and 40 mg every evening.  Potassium 20 mEq take 2 tablets 1 tablet daily.  Labs:  BMET scheduled for 11/23/2020 11/10/2020 Creatinine 1.54, BUN 34, Potassium 4.8, Sodium 135, GFR 33 10/26/2020 Creatinine1.67, BUN40, Potassium3.7, Sodium134, GFR30 10/25/2020 Creatinine1.55, BUN42, Potassium5.3, Sodium133, GFR33  10/24/2020 Creatinine1.70, BUN45, Potassium4.8, Sodium135, GFR30  02/12/2022Creatinine 1.95, BUN48, Potassium3.8, Sodium134, GFR25  02/11/2022Creatinine 2.29, BUN50, Potassium4.1, Sodium135, GFR21 A complete set of results can be found in Results Review.  Recommendations:Copy sent to Dr Aundra Dubin for review and recommendations if needed.   Follow-up plan: ICM clinic phone appointment on3/21/2022 to recheck fluid levels. 91 day device clinic remote transmission3/30/2022.   EP/Cardiology Office Visits:   04-23-22with Dr Aundra Dubin.10/29/2020 with Dr Rayann Heman was canceled  Copy of ICM check sent to Dr.Allred.   3 month ICM trend: 11/22/2020.    1 Year ICM trend:       Rosalene Billings, RN 11/22/2020 10:35 AM

## 2020-11-23 ENCOUNTER — Other Ambulatory Visit (HOSPITAL_COMMUNITY): Payer: PPO

## 2020-11-23 MED ORDER — FUROSEMIDE 20 MG PO TABS: 60.0000 mg | ORAL_TABLET | Freq: Two times a day (BID) | ORAL | 2 refills | Status: DC

## 2020-11-23 NOTE — Progress Notes (Signed)
Spoke with patient.  She reports more weight gain and today's weight is 127 lbs.  Home health nurse will draw labs for 3/16 and 3/23.  Advised new recommendation from  Dr Aundra Dubin is to increase Furosemide to 80 mg every morning and 60 mg every evening x 3 days.  After 3rd day take 60 mg every morning and 60 mg every afternoon.  Pt repeated instructions back correctly.

## 2020-11-24 DIAGNOSIS — I5043 Acute on chronic combined systolic (congestive) and diastolic (congestive) heart failure: Secondary | ICD-10-CM | POA: Diagnosis not present

## 2020-11-24 DIAGNOSIS — I5023 Acute on chronic systolic (congestive) heart failure: Secondary | ICD-10-CM | POA: Diagnosis not present

## 2020-11-24 DIAGNOSIS — I428 Other cardiomyopathies: Secondary | ICD-10-CM | POA: Diagnosis not present

## 2020-11-26 ENCOUNTER — Telehealth: Payer: Self-pay | Admitting: Hospice

## 2020-11-26 DIAGNOSIS — I5042 Chronic combined systolic (congestive) and diastolic (congestive) heart failure: Secondary | ICD-10-CM | POA: Diagnosis not present

## 2020-11-26 DIAGNOSIS — Z515 Encounter for palliative care: Secondary | ICD-10-CM

## 2020-11-26 NOTE — Telephone Encounter (Signed)
NP called HCPOAEddie Dibbles as requested to coordinate patient visit with [facility]/family/other caregivers. Next scheduled in-person visit for 12/02/2020 at 1.30pm

## 2020-11-28 DIAGNOSIS — I5043 Acute on chronic combined systolic (congestive) and diastolic (congestive) heart failure: Secondary | ICD-10-CM | POA: Diagnosis not present

## 2020-11-28 DIAGNOSIS — I428 Other cardiomyopathies: Secondary | ICD-10-CM | POA: Diagnosis not present

## 2020-11-28 DIAGNOSIS — I5023 Acute on chronic systolic (congestive) heart failure: Secondary | ICD-10-CM | POA: Diagnosis not present

## 2020-11-29 ENCOUNTER — Telehealth: Payer: Self-pay | Admitting: Surgery

## 2020-11-29 ENCOUNTER — Emergency Department (HOSPITAL_COMMUNITY): Payer: PPO

## 2020-11-29 ENCOUNTER — Ambulatory Visit (INDEPENDENT_AMBULATORY_CARE_PROVIDER_SITE_OTHER): Payer: PPO

## 2020-11-29 ENCOUNTER — Emergency Department (HOSPITAL_COMMUNITY)
Admission: EM | Admit: 2020-11-29 | Discharge: 2020-11-29 | Disposition: A | Discharge status: Home / Self Care | Payer: PPO | Attending: Emergency Medicine | Admitting: Emergency Medicine

## 2020-11-29 ENCOUNTER — Other Ambulatory Visit: Payer: Self-pay

## 2020-11-29 DIAGNOSIS — R600 Localized edema: Secondary | ICD-10-CM | POA: Diagnosis not present

## 2020-11-29 DIAGNOSIS — I493 Ventricular premature depolarization: Secondary | ICD-10-CM | POA: Diagnosis not present

## 2020-11-29 DIAGNOSIS — Z7901 Long term (current) use of anticoagulants: Secondary | ICD-10-CM | POA: Diagnosis not present

## 2020-11-29 DIAGNOSIS — I110 Hypertensive heart disease with heart failure: Secondary | ICD-10-CM | POA: Diagnosis not present

## 2020-11-29 DIAGNOSIS — N179 Acute kidney failure, unspecified: Secondary | ICD-10-CM

## 2020-11-29 DIAGNOSIS — I6782 Cerebral ischemia: Secondary | ICD-10-CM | POA: Diagnosis not present

## 2020-11-29 DIAGNOSIS — R531 Weakness: Secondary | ICD-10-CM | POA: Insufficient documentation

## 2020-11-29 DIAGNOSIS — R0602 Shortness of breath: Secondary | ICD-10-CM | POA: Diagnosis not present

## 2020-11-29 DIAGNOSIS — Z515 Encounter for palliative care: Secondary | ICD-10-CM | POA: Diagnosis not present

## 2020-11-29 DIAGNOSIS — I482 Chronic atrial fibrillation, unspecified: Secondary | ICD-10-CM | POA: Diagnosis not present

## 2020-11-29 DIAGNOSIS — S0990XA Unspecified injury of head, initial encounter: Secondary | ICD-10-CM | POA: Diagnosis not present

## 2020-11-29 DIAGNOSIS — Z96642 Presence of left artificial hip joint: Secondary | ICD-10-CM | POA: Diagnosis not present

## 2020-11-29 DIAGNOSIS — N183 Chronic kidney disease, stage 3 unspecified: Secondary | ICD-10-CM | POA: Diagnosis not present

## 2020-11-29 DIAGNOSIS — I5084 End stage heart failure: Secondary | ICD-10-CM | POA: Insufficient documentation

## 2020-11-29 DIAGNOSIS — Z7189 Other specified counseling: Secondary | ICD-10-CM

## 2020-11-29 DIAGNOSIS — I340 Nonrheumatic mitral (valve) insufficiency: Secondary | ICD-10-CM

## 2020-11-29 DIAGNOSIS — I517 Cardiomegaly: Secondary | ICD-10-CM | POA: Diagnosis not present

## 2020-11-29 DIAGNOSIS — I130 Hypertensive heart and chronic kidney disease with heart failure and stage 1 through stage 4 chronic kidney disease, or unspecified chronic kidney disease: Secondary | ICD-10-CM | POA: Diagnosis not present

## 2020-11-29 DIAGNOSIS — Z95810 Presence of automatic (implantable) cardiac defibrillator: Secondary | ICD-10-CM

## 2020-11-29 DIAGNOSIS — I5023 Acute on chronic systolic (congestive) heart failure: Secondary | ICD-10-CM

## 2020-11-29 DIAGNOSIS — Z20822 Contact with and (suspected) exposure to covid-19: Secondary | ICD-10-CM | POA: Insufficient documentation

## 2020-11-29 DIAGNOSIS — I5022 Chronic systolic (congestive) heart failure: Secondary | ICD-10-CM

## 2020-11-29 DIAGNOSIS — D689 Coagulation defect, unspecified: Secondary | ICD-10-CM | POA: Diagnosis not present

## 2020-11-29 LAB — COMPREHENSIVE METABOLIC PANEL
ALT: 24 U/L (ref 0–44)
AST: 32 U/L (ref 15–41)
Albumin: 3.3 g/dL — ABNORMAL LOW (ref 3.5–5.0)
Alkaline Phosphatase: 84 U/L (ref 38–126)
Anion gap: 9 (ref 5–15)
BUN: 35 mg/dL — ABNORMAL HIGH (ref 8–23)
CO2: 25 mmol/L (ref 22–32)
Calcium: 8.8 mg/dL — ABNORMAL LOW (ref 8.9–10.3)
Chloride: 99 mmol/L (ref 98–111)
Creatinine, Ser: 1.63 mg/dL — ABNORMAL HIGH (ref 0.44–1.00)
GFR, Estimated: 31 mL/min — ABNORMAL LOW (ref >60)
Glucose, Bld: 111 mg/dL — ABNORMAL HIGH (ref 70–99)
Potassium: 3.7 mmol/L (ref 3.5–5.1)
Sodium: 133 mmol/L — ABNORMAL LOW (ref 135–145)
Total Bilirubin: 1.7 mg/dL — ABNORMAL HIGH (ref 0.3–1.2)
Total Protein: 7.5 g/dL (ref 6.5–8.1)

## 2020-11-29 LAB — CBC WITH DIFFERENTIAL/PLATELET
Abs Immature Granulocytes: 0.01 10*3/uL (ref 0.00–0.07)
Basophils Absolute: 0.1 10*3/uL (ref 0.0–0.1)
Basophils Relative: 1 %
Eosinophils Absolute: 0.1 10*3/uL (ref 0.0–0.5)
Eosinophils Relative: 3 %
HCT: 31.6 % — ABNORMAL LOW (ref 36.0–46.0)
Hemoglobin: 10 g/dL — ABNORMAL LOW (ref 12.0–15.0)
Immature Granulocytes: 0 %
Lymphocytes Relative: 15 %
Lymphs Abs: 0.7 10*3/uL (ref 0.7–4.0)
MCH: 23.8 pg — ABNORMAL LOW (ref 26.0–34.0)
MCHC: 31.6 g/dL (ref 30.0–36.0)
MCV: 75.1 fL — ABNORMAL LOW (ref 80.0–100.0)
Monocytes Absolute: 0.5 10*3/uL (ref 0.1–1.0)
Monocytes Relative: 12 %
Neutro Abs: 3 10*3/uL (ref 1.7–7.7)
Neutrophils Relative %: 69 %
Platelets: 220 10*3/uL (ref 150–400)
RBC: 4.21 MIL/uL (ref 3.87–5.11)
RDW: 17.8 % — ABNORMAL HIGH (ref 11.5–15.5)
WBC: 4.4 10*3/uL (ref 4.0–10.5)
nRBC: 0 % (ref 0.0–0.2)

## 2020-11-29 LAB — RESP PANEL BY RT-PCR (FLU A&B, COVID) ARPGX2
Influenza A by PCR: NEGATIVE
Influenza B by PCR: NEGATIVE
SARS Coronavirus 2 by RT PCR: NEGATIVE

## 2020-11-29 LAB — TROPONIN I (HIGH SENSITIVITY)
Troponin I (High Sensitivity): 40 ng/L — ABNORMAL HIGH (ref <18)
Troponin I (High Sensitivity): 42 ng/L — ABNORMAL HIGH (ref <18)

## 2020-11-29 LAB — BRAIN NATRIURETIC PEPTIDE: B Natriuretic Peptide: 1429.6 pg/mL — ABNORMAL HIGH (ref 0.0–100.0)

## 2020-11-29 MED ORDER — POTASSIUM CHLORIDE CRYS ER 20 MEQ PO TBCR
40.0000 meq | EXTENDED_RELEASE_TABLET | Freq: Once | ORAL | Status: AC
  Administered 2020-11-29: 40 meq via ORAL
  Filled 2020-11-29: qty 2

## 2020-11-29 MED ORDER — ROPINIROLE HCL 0.5 MG PO TABS
0.5000 mg | ORAL_TABLET | Freq: Every day | ORAL | Status: DC
  Administered 2020-11-29: 0.5 mg via ORAL
  Filled 2020-11-29: qty 1

## 2020-11-29 MED ORDER — ONDANSETRON 4 MG PO TBDP: 4.0000 mg | ORAL_TABLET | Freq: Once | ORAL | Status: DC

## 2020-11-29 MED ORDER — FUROSEMIDE 10 MG/ML IJ SOLN
80.0000 mg | Freq: Once | INTRAMUSCULAR | Status: AC
  Administered 2020-11-29: 80 mg via INTRAVENOUS
  Filled 2020-11-29: qty 8

## 2020-11-29 MED ORDER — ONDANSETRON HCL 4 MG/2ML IJ SOLN
4.0000 mg | Freq: Once | INTRAMUSCULAR | Status: AC
  Administered 2020-11-29: 4 mg via INTRAVENOUS
  Filled 2020-11-29: qty 2

## 2020-11-29 MED ORDER — TORSEMIDE 20 MG PO TABS: 60.0000 mg | ORAL_TABLET | Freq: Every day | ORAL | 0 refills | Status: AC

## 2020-11-29 NOTE — Telephone Encounter (Signed)
Patient son called for clarification of discharge medication, on AVS, ED RNCM provided clarification.

## 2020-11-29 NOTE — ED Triage Notes (Signed)
C/O shortness of breathe when laying down as well as retaining fluid. Reported hx of CHF.

## 2020-11-29 NOTE — Consult Note (Addendum)
Advanced Heart Failure Team Consult Note   Primary Physician: Lavone Orn, MD PCP-Cardiologist:  [name], MD  [facility]: Dr. Aundra Dubin   Reason for Consultation: acute on chronic systolic heart failure   HPI:    Cheryl Vargas is seen today for evaluation of acute on chronic systolic heart failure at the request of Dr. Billy Fischer, Emergency Medicine.   Patient has a past medical history of HTN, HLD, PAF (On Xarelto), and nonischemic cardiomyopathy with chronic systolic CHF. Cardiomyopathy dates back to at least 2014 based on echoes (EF 30-35% in 2014).    Admitted 8/17 for fatigue and dyspnea.  She was seen by electrophysiology on 04/17/16, and was started on po Amiodarone as she reported increased palpitations and fatigue with frequent PVCs. Her BNP was elevated at 1592.6 and chest x ray was consistent with CHF.  She was diuresed.  With her complaints of new onset fatigue and dyspnea combined with her known LV dysfunction, it was felt that she would benefit from an ischemic evaluation by cath. She underwent left heart cath with normal cors. Amiodarone was later stopped due to nausea.  Discharge weight 143 pounds. Echo (8/17) with EF 40-45%, inferoseptal akinesis. Cardiac MRI 10/17 with EF 36%, LGE mid wall pattern in the basal to mid septum and inferior wall. Holter monitor in 9/17 with runs of atrial fibrillation/RVR.  She has had trouble tolerating cardiac meds due to hypotension/lightheadedness.    She has had difficulty with symptomatic atrial fibrillation. She developed symptomatic atrial fibrillation with RVR again in 10/18, difficult to control rate.  She had TEE-guided DCCV with resumption of NSR. On TEE in rapid atrial fibrillation, EF 20-25%.  On TTE after DCCV, EF back to 40-45% range.  MR reported as moderate to severe but I reviewed echo and think it appears more in the moderate range.    She had recurrent atrial fibrillation after 10/18 DCCV. She was admitted in 12/18 with  atrial fibrillation and RVR, she also had a run of WCT thought to be VT.   The atrial fibrillation was very difficult to control.  She was thought to be a poor candidate for atrial fibrillation ablation.  She finally had AV nodal ablation with Medtronic CRT-D device in 12/18. She was then admitted briefly with CHF in 1/19.  Echo in 12/18 showed EF 25-30%, moderate MR, moderate TR, severe LAE.  Echo in 4/19 showed EF 25-30%, diffuse hypokinesis, mild MR, moderately decreased RV systolic function.   Admitted 4/7 - 12/21/17 with A/C CHF. Found to have decreased BiV pacing in setting of frequent PVCs. Tried on Mexiletine but failed due to nausea. Switched to Ranexa. Diuresed with IV lasix.   In 5/19, she was admitted with a near-syncopal episode that occurred while straining for a bowel movement.  Suspected vagal event.   She has stopped ranolazine due to nausea.  She did not tolerate increase in bisoprolol.   She was admitted in 8/20 with presyncope; she would get abdominal pain followed by a feeling of warmth and diaphoresis, then she would get dizzy.  No syncope. No explanatory arrhythmias were seen on device interrogation or telemetry in the hospital.  She was not orthostatic or dry.  Thought to be possibly a vagal response to GI trigger (dyspepsia).  Mesenteric dopplers in 8/20 were normal. She feels like Pepcid has helped.   She was admitted in 1/21 with VT and ICD discharge.  RHC showed low output with CI 1.72.  Echo showed EF < 20%,  moderate LV dilation, mild RV dilation with decreased systolic function, mild-moderate MR.  She was started on amiodarone and digoxin.   She was admitted in 5/21 with UTI and hypotension.  Losartan and spironolactone were stopped.    Echo in 12/21 showed EF 20-25% with severe MR, severe TR, and moderate AI.  Patient had [place] in 12/21 due to concern for low output HF.  RHC did show low cardiac output, but this was complicated by localized branch PA perforation  requiring coil embolization by IR.  She was started on milrinone with improvement in symptoms though was still very weak.  Given frailty, she was thought to be poor candidate for LVAD placement.  We sent her home on milrinone with home health with plan to transition to hospice with any worsening.   Telehealth visit (12/21) she remained on milrinone 0.25 with HH and home PT following.  Taking Ensure 1-2 times/day.  Using rollator to get around the house without much dypsnea but she continues to fatigue easily.  She is back on amiodarone for frequent PVCs.    In January Optivol was suggestive of fluid accumulation. On 2/1 Dr Aundra Dubin recommended increasing lasix to 40 mg twice a day x 5 days then back to lasix 40 mg/20 mg. She canceled appointment with Dr Aundra Dubin on 10/14/20. On 2/7 suspected UTI. Prescribed Cipro but never started due to allergy.   Presented to [facility] (10/22/20) with fatigue and leg weakness. CT head negative. Creatinine on admit 2.3. Cardiology consulted and she was given IV lasix x1 then diuretics stopped and IV fluids started. CT abd- sigmoid diverticulosis, small hh, and stable cyst appearing areas liver/R kidney. Prior to admit she had 9 falls. Milrinone increased to 0.5 mcg, diuretics changed to PO. She was discharged with [facility] planning to continue milrinone when she is ready for hospice care.   Recently, she has required increased in home diuretics due to wt gain and increased dyspnea. Last week, she was instructed to increase lasix to 80 qam/ 60 qpm x 3 days, then 60 mg bid thereafter. Unfortunately, symptoms did not improve, prompting her to come to the ED for evaluation. Also endorses increasing LE weakness and falls at home. No head trauma. Head CT done today and negative. BNP 1,429. CXR shows no acute cardiopulmonary disease but Optivol fluid index/ impedence today c/w volume overload. Has AKI, SCr 1.6. Baseline 1.3. K 3.7. Hgb 10 c/w baseline. Na 133. Hs trop 40. No chest pain.      Review of Systems: [y] = yes, [ ]  = no   . General: Weight gain [ ] ; Weight loss [ ] ; Anorexia [ ] ; Fatigue [ ] ; Fever [ ] ; Chills [ ] ; Weakness [Y ]  . Cardiac: Chest pain/pressure [ ] ; Resting SOB [ Y]; Exertional SOB [Y ]; Orthopnea [ ] ; Pedal Edema [ ] ; Palpitations [ ] ; Syncope [ ] ; Presyncope [ ] ; Paroxysmal nocturnal dyspnea[ ]   . Pulmonary: Cough [ ] ; Wheezing[ ] ; Hemoptysis[ ] ; Sputum [ ] ; Snoring [ ]   . GI: Vomiting[ ] ; Dysphagia[ ] ; Melena[ ] ; Hematochezia [ ] ; Heartburn[ ] ; Abdominal pain [ ] ; Constipation [ ] ; Diarrhea [ ] ; BRBPR [ ]   . GU: Hematuria[ ] ; Dysuria [ ] ; Nocturia[ ]   . Vascular: Pain in legs with walking [ ] ; Pain in feet with lying flat [ ] ; Non-healing sores [ ] ; Stroke [ ] ; TIA [ ] ; Slurred speech [ ] ;  . Neuro: Headaches[ ] ; Vertigo[ ] ; Seizures[ ] ; Paresthesias[ ] ;Blurred vision [ ] ; Diplopia [ ] ;  Vision changes [ ]   . Ortho/Skin: Arthritis [ ] ; Joint pain [ ] ; Muscle pain [ ] ; Joint swelling [ ] ; Back Pain [ ] ; Rash [ ]   . Psych: Depression[ ] ; Anxiety[ ]   . Heme: Bleeding problems [ ] ; Clotting disorders [ ] ; Anemia [ ]   . Endocrine: Diabetes [ ] ; Thyroid dysfunction[ ]   Home Medications Prior to Admission medications   Medication Sig Start Date End Date Taking? Authorizing Provider  acetaminophen (TYLENOL) 500 MG tablet Take 500-1,000 mg by mouth every 6 (six) hours as needed for headache (pain).    [provider]  amiodarone (PACERONE) 200 MG tablet Take 1 tablet (200 mg total) by mouth daily. 10/30/20   Regalado, Jerald Kief A, MD  Calcium Carbonate Antacid (TUMS ULTRA 1000 PO) Take 1,000 mg by mouth 2 (two) times daily as needed (heartburn/indigestion).    [provider]  fluticasone (FLONASE) 50 MCG/ACT nasal spray Place 1 spray into both nostrils daily as needed for allergies or rhinitis (seasonal allergies).    [provider]  furosemide (LASIX) 20 MG tablet Take 3 tablets (60 mg total) by mouth 2 (two) times daily. 11/23/20    Larey Dresser, MD  levothyroxine (SYNTHROID) 50 MCG tablet Take 1 tablet (50 mcg total) by mouth daily before breakfast. 10/31/20   Regalado, Belkys A, MD  LORazepam (ATIVAN) 0.5 MG tablet Take 1 tablet (0.5 mg total) by mouth every 8 (eight) hours as needed for anxiety (anxiety,SOB). 10/30/20   Regalado, Belkys A, MD  magnesium oxide (MAG-OX) 400 MG tablet Take 0.5 tablets (200 mg total) by mouth 2 (two) times daily. 10/30/20   Regalado, Belkys A, MD  midodrine (PROAMATINE) 10 MG tablet Take 1 tablet (10 mg total) by mouth 2 (two) times daily with a meal. 10/30/20   Regalado, Belkys A, MD  ondansetron (ZOFRAN) 4 MG tablet Take 1 tablet (4 mg total) by mouth 2 (two) times daily as needed for nausea or vomiting. 10/30/20 11/29/20  Regalado, Belkys A, MD  pantoprazole (PROTONIX) 40 MG tablet Take 1 tablet (40 mg total) by mouth daily. 08/18/20   Clegg, [name] D, NP  polyethylene glycol (MIRALAX / GLYCOLAX) 17 g packet Take 17 g by mouth 2 (two) times daily. Patient not taking: Reported on 11/12/2020 10/30/20   Regalado, Jerald Kief A, MD  potassium chloride SA (KLOR-CON) 20 MEQ tablet Take 2 tablets (40 mEq total) by mouth 2 (two) times daily. 11/09/20   Larey Dresser, MD  Rivaroxaban (XARELTO) 15 MG TABS tablet Take 1 tablet (15 mg total) by mouth daily with supper. 10/18/20   Milford, Maricela Bo, FNP  rOPINIRole (REQUIP) 0.5 MG tablet Take 1 tablet (0.5 mg total) by mouth daily at 8 pm. 10/30/20   Regalado, Jerald Kief A, MD  sennosides-docusate sodium (SENOKOT-S) 8.6-50 MG tablet Take 1 tablet by mouth daily as needed for constipation.    [provider]  zolpidem (AMBIEN) 5 MG tablet Take 1 tablet (5 mg total) by mouth at bedtime. 10/30/20   Regalado, Cassie Freer, MD    Past Medical History: Past Medical History:  Diagnosis Date  . Arthritis   . Chronic combined systolic (congestive) and diastolic (congestive) heart failure ([place])   . Mitral regurgitation   . Non-ischemic cardiomyopathy ([place])   . Persistent  atrial fibrillation ([place])   . Ventricular tachycardia [facility])     Past Surgical History: Past Surgical History:  Procedure Laterality Date  . AV NODE ABLATION N/A 09/07/2017   Procedure: AV NODE ABLATION;  Surgeon: [name], MD;  Location: [place] CV LAB;  Service: Cardiovascular;  Laterality: N/A;  . BIV ICD INSERTION CRT-D N/A 09/07/2017   Procedure: BIV ICD INSERTION CRT-D;  Surgeon: [name], MD;  Location: [place] CV LAB;  Service: Cardiovascular;  Laterality: N/A;  . BUNIONECTOMY    . CARDIAC CATHETERIZATION     in 2004 at [facility]. "Insignificant blockage" per patient  . CARDIAC CATHETERIZATION N/A 04/25/2016   Procedure: Left Heart Cath and Coronary Angiography;  Surgeon: Nelva Bush, MD;  Location: [place] CV LAB;  Service: Cardiovascular;  Laterality: N/A;  . CARDIOVERSION N/A 06/29/2017   Procedure: CARDIOVERSION;  Surgeon: Larey Dresser, MD;  Location: [facility] ENDOSCOPY;  Service: Cardiovascular;  Laterality: N/A;  . CARDIOVERSION N/A 08/28/2017   Procedure: CARDIOVERSION;  Surgeon: Larey Dresser, MD;  Location: [place];  Service: Cardiovascular;  Laterality: N/A;  . IR ANGIOGRAM PULMONARY RIGHT SELECTIVE  08/04/2020  . IR ANGIOGRAM SELECTIVE EACH ADDITIONAL VESSEL  08/04/2020  . IR CT SPINE LTD  08/04/2020  . IR EMBO ART  VEN HEMORR LYMPH EXTRAV  INC GUIDE ROADMAPPING  08/04/2020  . IR FLUORO GUIDE CV LINE RIGHT  08/11/2020  . IR US GUIDE VASC ACCESS RIGHT  08/11/2020  . RADIOLOGY WITH ANESTHESIA N/A 08/04/2020   Procedure: IR WITH ANESTHESIA;  Surgeon: Arne Cleveland, MD;  Location: [place];  Service: Radiology;  Laterality: N/A;  . RIGHT HEART CATH N/A 09/29/2019   Procedure: RIGHT HEART CATH;  Surgeon: Larey Dresser, MD;  Location: [place] CV LAB;  Service: Cardiovascular;  Laterality: N/A;  . RIGHT HEART CATH N/A 08/04/2020   Procedure: RIGHT HEART CATH;  Surgeon: Larey Dresser, MD;  Location: [place] CV LAB;  Service:  Cardiovascular;  Laterality: N/A;  . SHOULDER SURGERY     closed reduction  . TEE WITHOUT CARDIOVERSION N/A 06/29/2017   Procedure: TRANSESOPHAGEAL ECHOCARDIOGRAM (TEE);  Surgeon: Larey Dresser, MD;  Location: [facility] ENDOSCOPY;  Service: Cardiovascular;  Laterality: N/A;  . TONSILLECTOMY    . TOTAL HIP ARTHROPLASTY Left 03/18/2013   Procedure: TOTAL HIP ARTHROPLASTY ANTERIOR APPROACH;  Surgeon: Mauri Pole, MD;  Location: [facility];  Service: Orthopedics;  Laterality: Left;  . TUBAL LIGATION      Family History: Family History  Problem Relation Age of Onset  . Lung cancer Mother   . Heart attack Father     Social History: Social History   Socioeconomic History  . Marital status: Widowed    Spouse name: Not on file  . Number of children: Not on file  . Years of education: Not on file  . Highest education level: Not on file  Occupational History  . Not on file  Tobacco Use  . Smoking status: Never Smoker  . Smokeless tobacco: Never Used  Vaping Use  . Vaping Use: Never used  Substance and Sexual Activity  . Alcohol use: Yes    Alcohol/week: 3.0 standard drinks    Types: 3 Glasses of wine per week    Comment: per week  . Drug use: No  . Sexual activity: Not on file  Other Topics Concern  . Not on file  Social History Narrative  . Not on file   Social Determinants of Health   Financial Resource Strain: Not on file  Food Insecurity: Not on file  Transportation Needs: Not on file  Physical Activity: Not on file  Stress: Not on file  Social Connections: Not on file  Allergies:  Allergies  Allergen Reactions  . Sotalol Other (See Comments)    Prolonged QTc  . Alendronate Sodium Other (See Comments)    Arm pain  . Compazine [Prochlorperazine] Other (See Comments)    Nervous reaction  . Dofetilide Nausea Only    Reported by [facility] physicians  . Lisinopril Other (See Comments)    Low bp  . Zolpidem Tartrate Er Other (See Comments)    Cannot take extended  release - makes her too tired the next day  . Ciprofloxacin Other (See Comments)    Not effective  . Coreg [Carvedilol] Other (See Comments)    Fatigue   . Metoprolol Other (See Comments)    Profound fatigue    Objective:    Vital Signs:   Temp:  [97.6 F (36.4 C)] 97.6 F (36.4 C) (03/21 0903) Pulse Rate:  [80] 80 (03/21 1000) Resp:  [16-22] 16 (03/21 1000) BP: (117-125)/(61-70) 120/69 (03/21 1000) SpO2:  [93 %-95 %] 94 % (03/21 1000) Weight:  [58.5 kg] 58.5 kg (03/21 0909)    Weight change: Filed Weights   11/29/20 0909  Weight: 58.5 kg    Intake/Output:  No intake or output data in the 24 hours ending 11/29/20 1059    Physical Exam    General:  Thin elderly AW. No resp difficulty HEENT: normal Neck: supple. JVP elevated to jaw . Carotids 2+ bilat; no bruits. No lymphadenopathy or thyromegaly appreciated. + rt tunnel cath in rt upper chest  Cor: PMI nondisplaced. Irregular rhythm. No rubs, gallops or murmurs. Lungs: decreased BS at the bases, w/ faint basilar crackles  Abdomen: soft, nontender, nondistended. No hepatosplenomegaly. No bruits or masses. Good bowel sounds. Extremities: no cyanosis, clubbing, rash, trace bilateral LE edema Neuro: alert & orientedx3, cranial nerves grossly intact. moves all 4 extremities w/o difficulty. Affect pleasant   Telemetry   Paced rhythm 80s   EKG    Paced 80 bpm   Labs   Basic Metabolic Panel: Recent Labs  Lab 11/29/20 0913  NA 133*  K 3.7  CL 99  CO2 25  GLUCOSE 111*  BUN 35*  CREATININE 1.63*  CALCIUM 8.8*    Liver Function Tests: Recent Labs  Lab 11/29/20 0913  AST 32  ALT 24  ALKPHOS 84  BILITOT 1.7*  PROT 7.5  ALBUMIN 3.3*   No results for input(s): LIPASE, AMYLASE in the last 168 hours. No results for input(s): AMMONIA in the last 168 hours.  CBC: Recent Labs  Lab 11/29/20 0913  WBC 4.4  NEUTROABS 3.0  HGB 10.0*  HCT 31.6*  MCV 75.1*  PLT 220    Cardiac Enzymes: No results for  input(s): CKTOTAL, CKMB, CKMBINDEX, TROPONINI in the last 168 hours.  BNP: BNP (last 3 results) Recent Labs    08/02/20 0832 10/22/20 1339 11/29/20 0913  BNP 1,285.0* 1,503.8* 1,429.6*    ProBNP (last 3 results) No results for input(s): PROBNP in the last 8760 hours.   CBG: No results for input(s): GLUCAP in the last 168 hours.  Coagulation Studies: No results for input(s): LABPROT, INR in the last 72 hours.   Imaging   DG Chest Portable 1 View  Result Date: 11/29/2020 CLINICAL DATA:  Shortness of breath. EXAM: PORTABLE CHEST 1 VIEW COMPARISON:  10/22/2020 FINDINGS: Again noted is cardiomegaly. Patient has a cardiac ICD and embolization coils involving a right lower lobe pulmonary artery. Right jugular central line tip is in the SVC region. Patchy densities at the lung bases are similar  to the previous examination. Negative for pulmonary edema or significant airspace disease. Atherosclerotic calcifications at the aortic arch. Negative for a pneumothorax. IMPRESSION: 1. No acute cardiopulmonary disease. 2. Chronic densities at the lung bases that could represent a combination of atelectasis and chronic disease. 3. Stable cardiomegaly with cardiac ICD. Electronically Signed   By: Markus Daft M.D.   On: 11/29/2020 09:50      Medications:     Current Medications:    Infusions:   Assessment/Plan    1. Acute on Chronic Systolic Heart Failure: [place] 04/2016 showed no significant coronary disease. Cardiac MRI 10/17 EF 36%, diffuse HK, normal RV, biatrial enlargement, moderate MR, non-coronary LGE pattern involving the mid-wall of the basal to mid septum and inferior wall. Possibly prior myocarditis versus a form of infiltrative disease. Medtronic CRT-D s/p AV nodal ablation. Echo in 1/21 showed EF <20% with moderate LV dilation and mildly decreased RV systolic function. Echo in 12/21 with EF 20-25%, severe MR, severe TR, moderate AI.Low output on 1/21 RHC (CI 1.72) and  12/21 RHC (CI 1.8). End stage HF now with home milrinone. - here w/ a/c CHF w/ NYHA Class IIIb symptoms. BNP 1,429. Device interrogated today and Optivol Fluid index and Impedence c/w fluid overload - Give IV Lasix 80 mg and supp K  - Change home regimen. Stop PO Lasix, Start torsemide 80 mg daily x 3 days>>60 mg daily.   - Continue milrinone 0.5 (this is maximum dose)  - She is off losartan, bisoprololand spironolactone with symptomatic hypotension and now is on midodrine 10 mg bid. - Off digoxin with elevated level. - She has end stage HF. She is a DNR, reasonable to move onto full hospice ([facility] to allow continued milrinone) when she is ready. Will ask Palliative care to see in the ED.  2. AKI: SCr 1.6 (baseline 1.3). Likely cardiorenal. She is fluid overloaded - give IV Lasix 80 mg x 1 - follow BMP  3. Atrial fibrillation: Chronic, s/p AV nodal ablation and BiV pacing.  - Continue Xarelto 15 mg daily. No bleeding issues but frequent falls. Head CT done today and negative for bleed/ ? Discontinuing a/c  4. PVCs/VT: Frequent PVCs have limited BiV pacing. Not a good PVC ablation candidate per EP. Had VT in 1/21. Cannot tolerate sotalol orTikosyn. Did not tolerate mexilitine with nausea. Had nausea also with ranolazine. She had nausea in the past with amiodarone but seems to be tolerating it now. -Continue amiodarone 200 mg daily for PVC suppression to try to promote BiV pacing. 6. Mitral regurgitation:Severe on 12/21 echo, not candidate for intervention. 6. Hypothyroidism: On Levoxyl. Per PCP.  Length of Stay: 0  Lyda Jester, PA-C  11/29/2020, 10:59 AM  Advanced Heart Failure Team Pager [phone] (M-F; 7a - 5p)  Please contact [facility] for night-coverage after hours (4p -7a ) and weekends on [email or web address]  Patient seen with PA, agree with the above note.   She came to the ER with increased dyspnea and weight gain as well as weakness.  We increased her Lasix last  week without improvement. She remains on milrinone 0.5 at home for end stage CHF.  She has palliative care following but not full hospice.  Creatinine is higher than baseline at 1.6.   General: NAD Neck: JVP 12 cm, no thyromegaly or thyroid nodule.  Lungs: Clear to auscultation bilaterally with normal respiratory effort. CV: Lateral PMI.  Heart regular S1/S2, no S3/S4, 2/6 HSM LLSB/apex.  1+ edema 1/2 to knees bilaterally.  No carotid bruit.  Normal pedal pulses.  Abdomen: Soft, nontender, no hepatosplenomegaly, no distention.  Skin: Intact without lesions or rashes.  Neurologic: Alert and oriented x 3.  Psych: Normal affect. Extremities: No clubbing or cyanosis.  HEENT: Normal.   Patient is volume overloaded by exam and Optivol though she is not markedly overloaded.  She has end stage CHF on palliative home milrinone.  I would like to try to get her home today.  - Will give Lasix 80 mg IV x 1 in the ER.  - I will have her stop Lasix at home and start torsemide 80 daily x 4 days then 60 mg daily after that.  - Continue midodrine 10 mg tid.  - Hopefully home later this afternoon on torsemide to start tomorrow.   I would like palliative care service to talk with her while she is here, would be reasonable to transition to hospice ([facility] as I would like her to continue milrinone).    [name] 11/29/2020 1:27 PM

## 2020-11-29 NOTE — Progress Notes (Signed)
Palliative Medicine RN Note: Noted order for asap consult from cardiology. Patient was seen by our NP Elie Confer, who is tied up in another meeting and will put in a note as soon as she is available.  I spoke with Gregary Signs, who gave me verbal order for hospice referral. Ms Grajeda is trying to decide what path she prefers: home on milrinone with [facility] hospice or stopping milrinone and going to residential hospice. [facility] does not have an inpt unit, so if she decides to stop milrinone and go residential, she wants to go to [facility].  I called Esther Hardy with [facility]. He will come to the ED and let Ms Kulikowski know how they can support her at home. Once she has spoken to them, she should be ready to make the decision on where she wants to receive hospice care and whether or not she wants to continue milrinone.  Marjie Skiff Henslee, RN, BSN, [facility] Palliative Medicine Team 11/29/2020 2:25 PM Office [phone]

## 2020-11-29 NOTE — ED Provider Notes (Signed)
  [facility] EMERGENCY DEPARTMENT   SIGN OUT NOTE MDM Rules/Calculators/A&P                          86 year old lady with end-stage heart disease on milrinone drip presenting to ER with concern for shortness of breath and fluid.  Provided dose of IV Lasix, cardiology consulted, palliative care consulted.  They are currently determining a plan in conjunction with patient about potentially enrolling patient in hospice.  6:10 PM Received signout from Dr. Billy Fischer, follow-up on plan from palliative/cardiology/case management  6:10 PM discussed with palliative and cardiology.  Per palliative, patient not ready to turn off milrinone.  Patient would like to be discharged, continue milrinone drip.  Cardiology has recommended switching to torsemide and discontinuing Lasix.  Provided Rx for torsemide.   Final Clinical Impression(s) / ED Diagnoses Final diagnoses:  End stage heart failure [facility])    Rx / DC Orders ED Discharge Orders         Ordered    torsemide (DEMADEX) 20 MG tablet  Daily        11/29/20 1649           Lucrezia Starch, MD 11/29/20 1810

## 2020-11-29 NOTE — Discharge Planning (Signed)
RNCM left message for Cheryl Vargas with [facility] to visit pt in ED to answer questions/help make decisions regarding disposition.  Montie Swiderski J. Clydene Laming, RN, BSN, NCM  Transitions of Care  Nurse Case Manager  [facility] Emergency Departments  Operative Services [phone]

## 2020-11-29 NOTE — Progress Notes (Signed)
EPIC Encounter for ICM Monitoring  Patient Name: Cheryl Vargas is a 86 y.o. female Date: 11/29/2020 Primary Care Physican: Lavone Orn, MD Primary Cardiologist:[place] Electrophysiologist:Allred Bi-V Pacing:98.6% 3/14/2022Weight: 127lbs   Patient currently in ED being evaluated.    OptivolThoracic impedancerechecked after Furosemide dosage increase on 3/15 as instructed by dr Aundra Dubin.  Impedance suggesting possible fluid accumulation worsening despite increase in Furosemide dosage to 80/60 x 3 days and then start 60 mg bid on 11/23/2020.  Prescribed:   Furosemide20 mg take3tablet (60 mg total) by mouth twice a day  Potassium 20 mEq take2 tablets1 tablet daily.  Labs:   11/29/2020 Creatinine 1.63, BUN 35, Potassium 3.7, Sodium 133, GFR 31 11/10/2020 Creatinine 1.54, BUN 34, Potassium 4.8, Sodium 135, GFR 33 10/26/2020 Creatinine1.67, BUN40, Potassium3.7, Sodium134, GFR30 10/25/2020 Creatinine1.55, BUN42, Potassium5.3, Sodium133, GFR33  10/24/2020 Creatinine1.70, BUN45, Potassium4.8, Sodium135, GFR30  02/12/2022Creatinine 1.95, BUN48, Potassium3.8, Sodium134, GFR25  02/11/2022Creatinine 2.29, BUN50, Potassium4.1, Sodium135, GFR21 A complete set of results can be found in Results Review.  Recommendations: Pt currently in ED.   Unsure if patient increased Furosemide as ordered on 11/23/2020  Follow-up plan: ICM clinic phone appointment on3/28/2022 to recheck fluid levels. 91 day device clinic remote transmission3/30/2022.   EP/Cardiology Office Visits: 04/07/22with Dr Aundra Dubin.10/29/2020 with Dr Donald Pore canceled  Copy of ICM check sent to Dr.Allred.   3 month ICM trend: 11/29/2020.    1 Year ICM trend:       Rosalene Billings, RN 11/29/2020 11:23 AM

## 2020-11-29 NOTE — Discharge Instructions (Addendum)
Follow-up with cardiology.  For the next 3 days, please take torsemide 80 mg (4 tablets) daily.  Then take 60 mg (3 tablets) daily thereafter.  If you develop shortness of breath, worsening swelling, chest pain or other new concerning symptom, return to ER for reassessment.

## 2020-11-29 NOTE — ED Provider Notes (Signed)
Cheryl Vargas Provider Note   CSN: [id number] Arrival date & time: 11/29/20  0848     History Chief Complaint  Patient presents with  . Retaining Fluid   . Shortness of Breath    Cheryl Vargas is a 86 y.o. female.  HPI      86 year old female with history of chronic combined systolic and diastolic congestive heart failure, now end-stage CHF with home milrinone, nonischemic cardiomyopathy, persistent atrial fibrillation on Xarelto, ventricular tachycardia who presents with concern for shortness of breath.  Reports generalized weakness developing over the last couple of days, and worsening dyspnea on exertion and orthopnea.  Reports that usually her weights are around 122 pounds, however they were 128 today.  She has had bilateral lower extremity edema over the last week.  Reports that due to her fatigue and generalized weakness she has had some near falls, and a fall 2 days ago.  She did not hit her head, no loss of consciousness, no headache, dizziness or vomiting.  Reports that she has chronic nausea and stomach upset which has been attributed to her congestive heart failure and milrinone.  No acute changes in this.  Denies fevers, black or bloody stools, cough.  She reports some chronic sensation of left-sided weakness since she had her prior left hip surgery for 5 years, intermittent blurred vision for several months, but no acute or new symptoms of visual changes, facial droop, numbness, weakness.   Son is also concerned she is not taking her medications as prescribed, that she does not let them regulate her Cheryl Vargas use, has not been taking her potassium as rx as she will take it "if I feel like it", is concerned her requip bottle is empty after less than one month and that she may be taking it inappropriately.  Reports she seems to be confused and having problems remembering things over the last 3 weeks.   Past Medical History:  Diagnosis Date   . Arthritis   . Chronic combined systolic (congestive) and diastolic (congestive) heart failure ([place])   . Mitral regurgitation   . Non-ischemic cardiomyopathy ([place])   . Persistent atrial fibrillation ([place])   . Ventricular tachycardia [facility])     Patient Active Problem List   Diagnosis Date Noted  . Protein-calorie malnutrition, severe 10/28/2020  . Acute exacerbation of CHF (congestive heart failure) ([place]) 10/22/2020  . Acute on chronic combined systolic and diastolic CHF (congestive heart failure) ([place])   . Palliative care encounter   . Goals of care, counseling/discussion   . Encounter for hospice care discussion   . Acute on chronic systolic (congestive) heart failure ([place]) 08/02/2020  . Malnutrition of moderate degree 02/03/2020  . DNR (do not resuscitate) discussion   . Palliative care by specialist   . UTI (urinary tract infection) 02/01/2020  . Prolonged QT interval 02/01/2020  . Chronic anticoagulation 02/01/2020  . Transient hypotension 02/01/2020  . History of shingles 02/01/2020  . Near syncope 05/05/2019  . Hypercholesterolemia without hypertriglyceridemia 02/13/2018  . Syncope 01/21/2018  . Constipation 01/21/2018  . CHF exacerbation ([place]) 12/18/2017  . CKD (chronic kidney disease), stage III ([place]) 12/16/2017  . SOB (shortness of breath)   . Abdominal pain 09/18/2017  . CHF (congestive heart failure) ([place]) 09/17/2017  . Pulmonary edema   . Cardiac device in situ   . Ventricular tachycardia ([place])   . Cardiac arrest ([place])   . Severe mitral valve regurgitation   . A-fib ([place]) 09/03/2017  .  Mitral regurgitation   . Acute on chronic systolic heart failure ([place])   . Non-ischemic cardiomyopathy ([place])   . CHF (congestive heart failure), NYHA class IV ([place]) 04/24/2016  . Dizziness 12/01/2015  . Hypertension 12/01/2015  . Insomnia 12/01/2015  . Chronic combined systolic and diastolic CHF (congestive heart failure) ([place]) 12/01/2015  . Paroxysmal atrial fibrillation  ([facility])   . Cardiac arrhythmia 10/20/2014  . Systolic dysfunction 42/68/3419  . Hypoventilation, idiopathic 10/20/2014  . Weakness 10/20/2014  . Closed left hip fracture ([place]) 03/17/2013  . Cardiomegaly 03/17/2013  . Frequent PVCs 03/17/2013  . Hyperlipidemia 03/17/2013    Past Surgical History:  Procedure Laterality Date  . AV NODE ABLATION N/A 09/07/2017   Procedure: AV NODE ABLATION;  Surgeon: [name], MD;  Location: [place] [facility];  Service: Cardiovascular;  Laterality: N/A;  . BIV ICD INSERTION CRT-D N/A 09/07/2017   Procedure: BIV ICD INSERTION CRT-D;  Surgeon: [name], MD;  Location: [place] [facility];  Service: Cardiovascular;  Laterality: N/A;  . BUNIONECTOMY    . CARDIAC CATHETERIZATION     in 2004 at [facility], [facility], [facility]. "Insignificant blockage" per patient  . CARDIAC CATHETERIZATION N/A 04/25/2016   Procedure: Left Heart Cath and Coronary Angiography;  Surgeon: Nelva Bush, MD;  Location: [place] [facility];  Service: Cardiovascular;  Laterality: N/A;  . CARDIOVERSION N/A 06/29/2017   Procedure: CARDIOVERSION;  Surgeon: Larey Dresser, MD;  Location: [facility] ENDOSCOPY;  Service: Cardiovascular;  Laterality: N/A;  . CARDIOVERSION N/A 08/28/2017   Procedure: CARDIOVERSION;  Surgeon: Larey Dresser, MD;  Location: [place];  Service: Cardiovascular;  Laterality: N/A;  . IR ANGIOGRAM PULMONARY RIGHT SELECTIVE  08/04/2020  . IR ANGIOGRAM SELECTIVE EACH ADDITIONAL VESSEL  08/04/2020  . IR CT SPINE LTD  08/04/2020  . IR EMBO ART  VEN HEMORR LYMPH EXTRAV  INC GUIDE ROADMAPPING  08/04/2020  . IR FLUORO GUIDE CV LINE RIGHT  08/11/2020  . IR US GUIDE VASC ACCESS RIGHT  08/11/2020  . RADIOLOGY WITH ANESTHESIA N/A 08/04/2020   Procedure: IR WITH ANESTHESIA;  Surgeon: Arne Cleveland, MD;  Location: [place];  Service: Radiology;  Laterality: N/A;  . RIGHT HEART CATH N/A 09/29/2019   Procedure: RIGHT HEART CATH;  Surgeon: Larey Dresser, MD;  Location: [place] [facility];   Service: Cardiovascular;  Laterality: N/A;  . RIGHT HEART CATH N/A 08/04/2020   Procedure: RIGHT HEART CATH;  Surgeon: Larey Dresser, MD;  Location: [place] [facility];  Service: Cardiovascular;  Laterality: N/A;  . SHOULDER SURGERY     closed reduction  . TEE WITHOUT CARDIOVERSION N/A 06/29/2017   Procedure: TRANSESOPHAGEAL ECHOCARDIOGRAM (TEE);  Surgeon: Larey Dresser, MD;  Location: [facility] ENDOSCOPY;  Service: Cardiovascular;  Laterality: N/A;  . TONSILLECTOMY    . TOTAL HIP ARTHROPLASTY Left 03/18/2013   Procedure: TOTAL HIP ARTHROPLASTY ANTERIOR APPROACH;  Surgeon: Mauri Pole, MD;  Location: [facility];  Service: Orthopedics;  Laterality: Left;  . TUBAL LIGATION       OB History   No obstetric history on file.     Family History  Problem Relation Age of Onset  . Lung cancer Mother   . Heart attack Father     Social History   Tobacco Use  . Smoking status: Never Smoker  . Smokeless tobacco: Never Used  Vaping Use  . Vaping Use: Never used  Substance Use Topics  . Alcohol use: Yes    Alcohol/week: 3.0 standard drinks    Types:  3 Glasses of wine per week    Comment: per week  . Drug use: No    Home Medications Prior to Admission medications   Medication Sig Start Date End Date Taking? Authorizing Provider  acetaminophen (TYLENOL) 500 MG tablet Take 500-1,000 mg by mouth every 6 (six) hours as needed for headache (pain).   Yes [provider]  amiodarone (PACERONE) 200 MG tablet Take 1 tablet (200 mg total) by mouth daily. 10/30/20  Yes Regalado, Belkys A, MD  Calcium Carbonate Antacid (TUMS ULTRA 1000 PO) Take 1,000 mg by mouth 2 (two) times daily as needed (heartburn/indigestion).   Yes [provider]  fluticasone (FLONASE) 50 MCG/ACT nasal spray Place 1 spray into both nostrils daily as needed for allergies or rhinitis (seasonal allergies).   Yes [provider]  levothyroxine (SYNTHROID) 50 MCG tablet Take 1 tablet (50 mcg total) by mouth  daily before breakfast. 10/31/20  Yes Regalado, Belkys A, MD  loperamide (IMODIUM A-D) 2 MG tablet Take 2 mg by mouth as needed for diarrhea or loose stools.   Yes [provider]  LORazepam (ATIVAN) 0.5 MG tablet Take 1 tablet (0.5 mg total) by mouth every 8 (eight) hours as needed for anxiety (anxiety,SOB). 10/30/20  Yes Regalado, Belkys A, MD  magnesium oxide (MAG-OX) 400 MG tablet Take 0.5 tablets (200 mg total) by mouth 2 (two) times daily. 10/30/20  Yes Regalado, Belkys A, MD  midodrine (PROAMATINE) 10 MG tablet Take 1 tablet (10 mg total) by mouth 2 (two) times daily with a meal. 10/30/20  Yes Regalado, Belkys A, MD  ondansetron (ZOFRAN) 4 MG tablet Take 1 tablet (4 mg total) by mouth 2 (two) times daily as needed for nausea or vomiting. 10/30/20 11/29/20 Yes Regalado, Belkys A, MD  pantoprazole (PROTONIX) 40 MG tablet Take 1 tablet (40 mg total) by mouth daily. 08/18/20  Yes Clegg, [name] D, NP  potassium chloride SA (KLOR-CON) 20 MEQ tablet Take 2 tablets (40 mEq total) by mouth 2 (two) times daily. 11/09/20  Yes Larey Dresser, MD  Rivaroxaban (XARELTO) 15 MG TABS tablet Take 1 tablet (15 mg total) by mouth daily with supper. 10/18/20  Yes [place], Maricela Bo, FNP  rOPINIRole (REQUIP) 0.5 MG tablet Take 1 tablet (0.5 mg total) by mouth daily at 8 pm. 10/30/20  Yes Regalado, Belkys A, MD  sennosides-docusate sodium (SENOKOT-S) 8.6-50 MG tablet Take 1 tablet by mouth daily as needed for constipation.   Yes [provider]  torsemide (DEMADEX) 20 MG tablet Take 3 tablets (60 mg total) by mouth daily. 11/29/20  Yes Lucrezia Starch, MD  zolpidem (AMBIEN) 10 MG tablet Take 10 mg by mouth at bedtime. 11/12/20  Yes [provider]  furosemide (LASIX) 20 MG tablet Take 3 tablets (60 mg total) by mouth 2 (two) times daily. 11/23/20 11/29/20 Yes Larey Dresser, MD  polyethylene glycol (MIRALAX / GLYCOLAX) 17 g packet Take 17 g by mouth 2 (two) times daily. Patient not taking: No sig  reported 10/30/20   Regalado, Belkys A, MD    Allergies    Sotalol, Alendronate sodium, Compazine [prochlorperazine], Dofetilide, Lisinopril, Ranexa [ranolazine], Septra [sulfamethoxazole-trimethoprim], Zolpidem tartrate er, Ciprofloxacin, Coreg [carvedilol], and Metoprolol  Review of Systems   Review of Systems  Constitutional: Negative for fever.  HENT: Negative for sore throat.   Eyes: Negative for visual disturbance.  Respiratory: Positive for shortness of breath. Negative for cough.   Cardiovascular: Positive for leg swelling. Negative for chest pain.  Gastrointestinal:  Positive for nausea. Negative for abdominal pain, diarrhea and vomiting.  Genitourinary: Negative for difficulty urinating.  Musculoskeletal: Negative for back pain and neck pain.  Skin: Negative for rash.  Neurological: Negative for syncope and headaches.    Physical Exam Updated Vital Signs BP 119/66   Pulse 80   Temp 98.1 F (36.7 C) (Oral)   Resp (!) 22   Ht 5\' 5"  (1.651 m)   Wt 58.5 kg   SpO2 97%   BMI 21.47 kg/m   Physical Exam Vitals and nursing note reviewed.  Constitutional:      General: She is not in acute distress.    Appearance: She is well-developed. She is not diaphoretic.  HENT:     Head: Normocephalic and atraumatic.  Eyes:     Conjunctiva/sclera: Conjunctivae normal.  Neck:     Vascular: JVD present.  Cardiovascular:     Rate and Rhythm: Normal rate and regular rhythm.     Heart sounds: Normal heart sounds. No murmur heard. No friction rub. No gallop.   Pulmonary:     Effort: Pulmonary effort is normal. No respiratory distress.     Breath sounds: Normal breath sounds. No wheezing or rales.  Abdominal:     General: There is no distension.     Palpations: Abdomen is soft.     Tenderness: There is no abdominal tenderness. There is no guarding.  Musculoskeletal:        General: No tenderness.     Cervical back: Normal range of motion.     Right lower leg: Edema present.      Left lower leg: Edema present.  Skin:    General: Skin is warm and dry.     Findings: No erythema or rash.  Neurological:     Mental Status: She is alert and oriented to person, place, and time.     ED Results / Procedures / Treatments   Labs (all labs ordered are listed, but only abnormal results are displayed) Labs Reviewed  CBC WITH DIFFERENTIAL/PLATELET - Abnormal; Notable for the following components:      Result Value   Hemoglobin 10.0 (*)    HCT 31.6 (*)    MCV 75.1 (*)    MCH 23.8 (*)    RDW 17.8 (*)    All other components within normal limits  COMPREHENSIVE METABOLIC PANEL - Abnormal; Notable for the following components:   Sodium 133 (*)    Glucose, Bld 111 (*)    BUN 35 (*)    Creatinine, Ser 1.63 (*)    Calcium 8.8 (*)    Albumin 3.3 (*)    Total Bilirubin 1.7 (*)    GFR, Estimated 31 (*)    All other components within normal limits  BRAIN NATRIURETIC PEPTIDE - Abnormal; Notable for the following components:   B Natriuretic Peptide 1,429.6 (*)    All other components within normal limits  TROPONIN I (HIGH SENSITIVITY) - Abnormal; Notable for the following components:   Troponin I (High Sensitivity) 40 (*)    All other components within normal limits  TROPONIN I (HIGH SENSITIVITY) - Abnormal; Notable for the following components:   Troponin I (High Sensitivity) 42 (*)    All other components within normal limits  RESP PANEL BY RT-PCR (FLU A&B, COVID) [id number]    EKG EKG Interpretation  Date/Time:  Monday November 29 2020 11:43:04 EDT Ventricular Rate:  80 PR Interval:    QRS Duration: 131 QT Interval:  500 QTC Calculation: 577 R  Axis:   139 Text Interpretation: Sinus rhythm Prolonged PR interval Nonspecific intraventricular conduction delay Borderline T abnormalities, inferior leads Confirmed by Madalyn Rob [id number]) on 11/29/2020 4:33:09 PM   Radiology CT Head Wo Contrast  Result Date: 11/29/2020 CLINICAL DATA:  Head trauma, coagulopathy. EXAM: CT  HEAD WITHOUT CONTRAST TECHNIQUE: Contiguous axial images were obtained from the base of the skull through the vertex without intravenous contrast. COMPARISON:  10/22/2020 head CT. FINDINGS: Brain: Generalized cerebral volume loss. No evidence of parenchymal hemorrhage or extra-axial fluid collection. No mass lesion, mass effect, or midline shift. No CT evidence of acute infarction. Nonspecific mild subcortical and periventricular white matter hypodensity, most in keeping with chronic small vessel ischemic change. No ventriculomegaly. Vascular: No acute abnormality. Skull: No evidence of calvarial fracture. Sinuses/Orbits: The visualized paranasal sinuses are essentially clear. Other:  The mastoid air cells are unopacified. IMPRESSION: 1. No evidence of acute intracranial abnormality. No evidence of calvarial fracture. 2. Generalized cerebral volume loss and mild chronic small vessel ischemic changes in the cerebral white matter. Electronically Signed   By: Ilona Sorrel M.D.   On: 11/29/2020 11:33   DG Chest Portable 1 View  Result Date: 11/29/2020 CLINICAL DATA:  Shortness of breath. EXAM: PORTABLE CHEST 1 VIEW COMPARISON:  10/22/2020 FINDINGS: Again noted is cardiomegaly. Patient has a cardiac ICD and embolization coils involving a right lower lobe pulmonary artery. Right jugular central line tip is in the SVC region. Patchy densities at the lung bases are similar to the previous examination. Negative for pulmonary edema or significant airspace disease. Atherosclerotic calcifications at the aortic arch. Negative for a pneumothorax. IMPRESSION: 1. No acute cardiopulmonary disease. 2. Chronic densities at the lung bases that could represent a combination of atelectasis and chronic disease. 3. Stable cardiomegaly with cardiac ICD. Electronically Signed   By: Markus Daft M.D.   On: 11/29/2020 09:50    Procedures Procedures   Medications Ordered in ED Medications  rOPINIRole (REQUIP) tablet 0.5 mg (0.5 mg  Oral Given 11/29/20 1144)  ondansetron (ZOFRAN) injection 4 mg (4 mg Intravenous Given 11/29/20 1136)  furosemide (LASIX) injection 80 mg (80 mg Intravenous Given 11/29/20 1403)  potassium chloride SA (KLOR-CON) CR tablet 40 mEq (40 mEq Oral Given 11/29/20 1403)    ED Course  I have reviewed the triage vital signs and the nursing notes.  Pertinent labs & imaging results that were available during my care of the patient were reviewed by me and considered in my medical decision making (see chart for details).    MDM Rules/Calculators/A&P                          86 year old female with history of chronic combined systolic and diastolic congestive heart failure, now end-stage CHF with home milrinone, nonischemic cardiomyopathy, persistent atrial fibrillation on Xarelto, ventricular tachycardia who presents with concern for shortness of breath.  CT head ordered given report of fall on anticoagulation and some memory issues per son.  NO acute abnormalities on CT.  History and physical exam are most consistent with worsening congestive heart failure, with increased weight gain, orthopnea, bilateral lower extremity edema.  CXR does not show pulmonary edema, however.  Given complex CHF history, will consult Cardiology for further recommendations.  Cardiology recommends 80mg  IV lasix, switching torsemide and following up with palliative recommendations--palliative discussing options with patient and plan pending at time of transfer of care to Dr. Roslynn Amble.    Final Clinical Impression(s) / ED Diagnoses  Final diagnoses:  End stage heart failure [facility])    Rx / DC Orders ED Discharge Orders         Ordered    torsemide (DEMADEX) 20 MG tablet  Daily        11/29/20 1649           Gareth Morgan, MD 11/29/20 2207

## 2020-11-29 NOTE — Consult Note (Signed)
Consultation Note Date: 11/29/2020   Patient Name: Cheryl Vargas  DOB: 1935-05-03  MRN: [id number]  Age / Sex: 86 y.o., female  PCP: Lavone Orn, MD Referring Physician: Lucrezia Starch, MD  Reason for Consultation: Establishing goals of care  HPI/Patient Profile: 86 y.o. female  with past medical history of nonischemic cardiomyopathy, chronic systolic CHF (EF 38-87% on echo November 2021), paroxysmal atrial fibrillation, severe mitral valve regurgitation, HTN, HLD, and CKD stage IIIb. She presented to the emergency department on 11/29/2020 with shortness of breath. Labs significant for BNP 1429, troponin 40, creatinine 1.63  Patient has end-stage heart failure and has been on milrinone infusion since December 2021. She has been seen by cardiology - they have recommended switching diuretic to torsemide and discontinuing lasix. PMT has been consulted by cardiology to assist with Jud discussion in the setting of end-stage heart failure.   Clinical Assessment and Goals of Care: 13:30--I have reviewed medical records including EPIC notes, labs and imaging, and met at bedside with patient and son/Cheryl Vargas  to discuss diagnosis, prognosis, GOC, EOL wishes, disposition, and options.  Patient is well known to PMT. She was followed by our service during her hospitalization 10/22/20--10/30/20 as well as during her hospitalization 08/02/20--08/18/20. She has also been followed by outpatient palliative care services with [facility].   Patient has 3 children; 2 of them ([name] and Cheryl Vargas) live in [place]. The other son Cheryl Vargas) lives in [place]. Patient lives alone in a 14th floor apartment in [place]. She has 2 cats that she loves dearly. As far as functional status, she shares it has become increasingly difficult for her to care for herself at home.    Natural disease trajectory of end-stage heart failure was discussed. As  in previous discussions with PMT, she understands that her condition is terminal and that her time is limited. Discussed at length that she is dependent on milrinone, and that her disease will continue to progress/worsen despite this medication. Patient verbalizes understanding, and is grateful for the additional time that milrinone has provided.   [name] shares that his mom has recently expressed that she is "tired" and thinks she is "ready for hospice". Patient confirms this, stating that she is becoming progressively weaker and more fatigued over the past few months. She understands this is directly related to her heart failure and will only continue to worsen. Discussed the option of stopping the milrinone and treating her symptoms with the goal of comfort rather than prolonging life.   Discussed that the only option for hospice care if continuing milrinone is [facility]. Provided education and counseling on the philosophy of hospice and that the goal is comfort rather than prolonging life. Discussed that milrinone can be considered a life-prolonging treatment in the setting of end-stage heart failure. Therefore, the large majority of hospice agencies will not cover this medication.   I suggested that patient and son speak with a liaison from [facility] to obtain more information about their services and offered to return to the room later.   16:30--I  returned to the room. Patient and son spoke with liaison from [facility] and feel their services would be a good fit. They would be able to provide more care than she is receiving from her current home health agency (who provides a once weekly visit).   Patient has become tearful and seems to be struggling with making complex decisions and the logistics of receiving care at home. I provided counseling that the logistics of home care can be worked out, but that the major decision is whether she wants to continue milrinone. Discussed that prognosis would be very  limited on stopping milrinone, likely less than 2 weeks. Patient verbalizes that she wants more time to spend with her family and see her grandchildren continue to grow-up. She is not ready to stop milrinone at this time. However, she does want the additional home care services that can be provided by [name] hospice.    17:15--discussed plan of care with EDP Dr. Roslynn Amble and cardiology PA Brittainy  Primary decision maker: Patient    SUMMARY OF RECOMMENDATIONS    DNR/DNI as previously documented  Continue milrinone  Pending discharge home with [facility] hospice  Code Status/Advance Care Planning:  DNR  Psycho-social/Spiritual:   Created space and opportunity for patient and family to express thoughts and feelings regarding patient's current medical situation.   Emotional support provided   Prognosis:   Less than 6 months  Discharge Planning: home with hospice      Primary Diagnoses: Present on Admission: **None**   I have reviewed the medical record, interviewed the patient and family, and examined the patient. The following aspects are pertinent.  Past Medical History:  Diagnosis Date  . Arthritis   . Chronic combined systolic (congestive) and diastolic (congestive) heart failure ([place])   . Mitral regurgitation   . Non-ischemic cardiomyopathy ([place])   . Persistent atrial fibrillation ([place])   . Ventricular tachycardia ([facility])    Family History  Problem Relation Age of Onset  . Lung cancer Mother   . Heart attack Father    Scheduled Meds: . rOPINIRole  0.5 mg Oral QHS   Continuous Infusions: PRN Meds:. Medications Prior to Admission:  Prior to Admission medications   Medication Sig Start Date End Date Taking? Authorizing Provider  acetaminophen (TYLENOL) 500 MG tablet Take 500-1,000 mg by mouth every 6 (six) hours as needed for headache (pain).   Yes [provider]  amiodarone (PACERONE) 200 MG tablet Take 1 tablet (200 mg total) by mouth daily.  10/30/20  Yes Regalado, Belkys A, MD  Calcium Carbonate Antacid (TUMS ULTRA 1000 PO) Take 1,000 mg by mouth 2 (two) times daily as needed (heartburn/indigestion).   Yes [provider]  fluticasone (FLONASE) 50 MCG/ACT nasal spray Place 1 spray into both nostrils daily as needed for allergies or rhinitis (seasonal allergies).   Yes [provider]  levothyroxine (SYNTHROID) 50 MCG tablet Take 1 tablet (50 mcg total) by mouth daily before breakfast. 10/31/20  Yes Regalado, Belkys A, MD  loperamide (IMODIUM A-D) 2 MG tablet Take 2 mg by mouth as needed for diarrhea or loose stools.   Yes [provider]  LORazepam (ATIVAN) 0.5 MG tablet Take 1 tablet (0.5 mg total) by mouth every 8 (eight) hours as needed for anxiety (anxiety,SOB). 10/30/20  Yes Regalado, Belkys A, MD  magnesium oxide (MAG-OX) 400 MG tablet Take 0.5 tablets (200 mg total) by mouth 2 (two) times daily. 10/30/20  Yes Regalado, Belkys A, MD  midodrine (PROAMATINE) 10 MG tablet  Take 1 tablet (10 mg total) by mouth 2 (two) times daily with a meal. 10/30/20  Yes Regalado, Belkys A, MD  ondansetron (ZOFRAN) 4 MG tablet Take 1 tablet (4 mg total) by mouth 2 (two) times daily as needed for nausea or vomiting. 10/30/20 11/29/20 Yes Regalado, Belkys A, MD  pantoprazole (PROTONIX) 40 MG tablet Take 1 tablet (40 mg total) by mouth daily. 08/18/20  Yes Clegg, [name] D, NP  potassium chloride SA (KLOR-CON) 20 MEQ tablet Take 2 tablets (40 mEq total) by mouth 2 (two) times daily. 11/09/20  Yes Larey Dresser, MD  Rivaroxaban (XARELTO) 15 MG TABS tablet Take 1 tablet (15 mg total) by mouth daily with supper. 10/18/20  Yes Milford, Maricela Bo, FNP  rOPINIRole (REQUIP) 0.5 MG tablet Take 1 tablet (0.5 mg total) by mouth daily at 8 pm. 10/30/20  Yes Regalado, Belkys A, MD  sennosides-docusate sodium (SENOKOT-S) 8.6-50 MG tablet Take 1 tablet by mouth daily as needed for constipation.   Yes [provider]  torsemide (DEMADEX) 20 MG  tablet Take 3 tablets (60 mg total) by mouth daily. 11/29/20  Yes Lucrezia Starch, MD  zolpidem (AMBIEN) 10 MG tablet Take 10 mg by mouth at bedtime. 11/12/20  Yes [provider]  furosemide (LASIX) 20 MG tablet Take 3 tablets (60 mg total) by mouth 2 (two) times daily. 11/23/20 11/29/20 Yes Larey Dresser, MD  polyethylene glycol (MIRALAX / GLYCOLAX) 17 g packet Take 17 g by mouth 2 (two) times daily. Patient not taking: No sig reported 10/30/20   Niel Hummer A, MD   Allergies  Allergen Reactions  . Sotalol Other (See Comments)    Prolonged QTc  . Alendronate Sodium Other (See Comments)    Arm pain  . Compazine [Prochlorperazine] Other (See Comments)    Nervous reaction  . Dofetilide Nausea Only    Reported by [facility] physicians  . Lisinopril Other (See Comments)    Low bp  . Ranexa [Ranolazine] Nausea Only  . Septra [Sulfamethoxazole-Trimethoprim] Nausea Only  . Zolpidem Tartrate Er Other (See Comments)    Cannot take extended release - makes her too tired the next day  . Ciprofloxacin Other (See Comments)    Not effective  . Coreg [Carvedilol] Other (See Comments)    Fatigue   . Metoprolol Other (See Comments)    Profound fatigue   Review of Systems  Constitutional: Positive for fatigue.  Neurological: Positive for weakness.    Physical Exam Vitals reviewed.  Constitutional:      General: She is not in acute distress.    Appearance: She is ill-appearing.  Cardiovascular:     Rate and Rhythm: Normal rate.  Pulmonary:     Effort: Pulmonary effort is normal.  Neurological:     Mental Status: She is alert and oriented to person, place, and time.     Motor: Weakness present.  Psychiatric:        Mood and Affect: Affect is tearful.     Vital Signs: BP 116/70   Pulse 79   Temp 97.7 F (36.5 C) (Oral)   Resp 20   Ht _0  (1.651 m)   Wt 58.5 kg   SpO2 93%   BMI 21.47 kg/m  Pain Scale: 0-10   Pain Score: 0-No pain   SpO2: SpO2: 93 % O2  Device:SpO2: 93 % O2 Flow Rate: .   IO: Intake/output summary: No intake or output data in the 24 hours ending 11/29/20 1816  LBM:  Baseline Weight: Weight: 58.5 kg Most recent weight: Weight: 58.5 kg      Palliative Assessment/Data: PPS 40-50%     Time In: 1330 Time Out: 1400 Time In: 1630 Time Out: 1715 Time Total: 75 minutes Greater than 50%  of this time was spent counseling and coordinating care related to the above assessment and plan.  Signed by: Lavena Bullion, NP   Please contact Palliative Medicine Team phone at [phone] for questions and concerns.  For individual provider: See Shea Evans

## 2020-12-01 DIAGNOSIS — I5023 Acute on chronic systolic (congestive) heart failure: Secondary | ICD-10-CM | POA: Diagnosis not present

## 2020-12-04 ENCOUNTER — Other Ambulatory Visit (HOSPITAL_COMMUNITY): Payer: Self-pay | Admitting: Adult Health

## 2020-12-06 ENCOUNTER — Other Ambulatory Visit: Payer: Self-pay

## 2020-12-06 ENCOUNTER — Encounter (HOSPITAL_COMMUNITY): Payer: Self-pay

## 2020-12-06 ENCOUNTER — Emergency Department (HOSPITAL_COMMUNITY): Payer: Medicare Other

## 2020-12-06 ENCOUNTER — Inpatient Hospital Stay (HOSPITAL_COMMUNITY)
Admission: EM | Admit: 2020-12-06 | Discharge: 2021-01-09 | DRG: 535 | Disposition: E | Payer: Medicare Other | Attending: Internal Medicine | Admitting: Internal Medicine

## 2020-12-06 ENCOUNTER — Inpatient Hospital Stay (HOSPITAL_COMMUNITY): Payer: Medicare Other

## 2020-12-06 DIAGNOSIS — F419 Anxiety disorder, unspecified: Secondary | ICD-10-CM | POA: Diagnosis not present

## 2020-12-06 DIAGNOSIS — E039 Hypothyroidism, unspecified: Secondary | ICD-10-CM | POA: Diagnosis not present

## 2020-12-06 DIAGNOSIS — E7800 Pure hypercholesterolemia, unspecified: Secondary | ICD-10-CM | POA: Diagnosis not present

## 2020-12-06 DIAGNOSIS — S72001A Fracture of unspecified part of neck of right femur, initial encounter for closed fracture: Secondary | ICD-10-CM | POA: Diagnosis present

## 2020-12-06 DIAGNOSIS — D509 Iron deficiency anemia, unspecified: Secondary | ICD-10-CM | POA: Diagnosis present

## 2020-12-06 DIAGNOSIS — R52 Pain, unspecified: Secondary | ICD-10-CM | POA: Diagnosis not present

## 2020-12-06 DIAGNOSIS — S72011A Unspecified intracapsular fracture of right femur, initial encounter for closed fracture: Secondary | ICD-10-CM | POA: Diagnosis not present

## 2020-12-06 DIAGNOSIS — R54 Age-related physical debility: Secondary | ICD-10-CM | POA: Diagnosis present

## 2020-12-06 DIAGNOSIS — W010XXA Fall on same level from slipping, tripping and stumbling without subsequent striking against object, initial encounter: Secondary | ICD-10-CM | POA: Diagnosis present

## 2020-12-06 DIAGNOSIS — I4821 Permanent atrial fibrillation: Secondary | ICD-10-CM | POA: Diagnosis not present

## 2020-12-06 DIAGNOSIS — Y92009 Unspecified place in unspecified non-institutional (private) residence as the place of occurrence of the external cause: Secondary | ICD-10-CM

## 2020-12-06 DIAGNOSIS — Z7901 Long term (current) use of anticoagulants: Secondary | ICD-10-CM | POA: Diagnosis not present

## 2020-12-06 DIAGNOSIS — I5084 End stage heart failure: Secondary | ICD-10-CM | POA: Diagnosis present

## 2020-12-06 DIAGNOSIS — M1990 Unspecified osteoarthritis, unspecified site: Secondary | ICD-10-CM | POA: Diagnosis present

## 2020-12-06 DIAGNOSIS — E785 Hyperlipidemia, unspecified: Secondary | ICD-10-CM | POA: Diagnosis not present

## 2020-12-06 DIAGNOSIS — Z801 Family history of malignant neoplasm of trachea, bronchus and lung: Secondary | ICD-10-CM

## 2020-12-06 DIAGNOSIS — S72001D Fracture of unspecified part of neck of right femur, subsequent encounter for closed fracture with routine healing: Secondary | ICD-10-CM | POA: Diagnosis not present

## 2020-12-06 DIAGNOSIS — R9431 Abnormal electrocardiogram [ECG] [EKG]: Secondary | ICD-10-CM | POA: Diagnosis not present

## 2020-12-06 DIAGNOSIS — E871 Hypo-osmolality and hyponatremia: Secondary | ICD-10-CM | POA: Diagnosis present

## 2020-12-06 DIAGNOSIS — I428 Other cardiomyopathies: Secondary | ICD-10-CM | POA: Diagnosis present

## 2020-12-06 DIAGNOSIS — Z8249 Family history of ischemic heart disease and other diseases of the circulatory system: Secondary | ICD-10-CM

## 2020-12-06 DIAGNOSIS — Z66 Do not resuscitate: Secondary | ICD-10-CM | POA: Diagnosis present

## 2020-12-06 DIAGNOSIS — Z888 Allergy status to other drugs, medicaments and biological substances status: Secondary | ICD-10-CM

## 2020-12-06 DIAGNOSIS — Z20822 Contact with and (suspected) exposure to covid-19: Secondary | ICD-10-CM | POA: Diagnosis present

## 2020-12-06 DIAGNOSIS — S72041A Displaced fracture of base of neck of right femur, initial encounter for closed fracture: Secondary | ICD-10-CM | POA: Diagnosis not present

## 2020-12-06 DIAGNOSIS — M25551 Pain in right hip: Secondary | ICD-10-CM | POA: Diagnosis not present

## 2020-12-06 DIAGNOSIS — I493 Ventricular premature depolarization: Secondary | ICD-10-CM | POA: Diagnosis present

## 2020-12-06 DIAGNOSIS — W19XXXA Unspecified fall, initial encounter: Secondary | ICD-10-CM | POA: Diagnosis not present

## 2020-12-06 DIAGNOSIS — Z7189 Other specified counseling: Secondary | ICD-10-CM

## 2020-12-06 DIAGNOSIS — I083 Combined rheumatic disorders of mitral, aortic and tricuspid valves: Secondary | ICD-10-CM | POA: Diagnosis present

## 2020-12-06 DIAGNOSIS — I517 Cardiomegaly: Secondary | ICD-10-CM | POA: Diagnosis not present

## 2020-12-06 DIAGNOSIS — Z79890 Hormone replacement therapy (postmenopausal): Secondary | ICD-10-CM

## 2020-12-06 DIAGNOSIS — I5043 Acute on chronic combined systolic (congestive) and diastolic (congestive) heart failure: Secondary | ICD-10-CM | POA: Diagnosis present

## 2020-12-06 DIAGNOSIS — Z79899 Other long term (current) drug therapy: Secondary | ICD-10-CM

## 2020-12-06 DIAGNOSIS — Z96642 Presence of left artificial hip joint: Secondary | ICD-10-CM | POA: Diagnosis present

## 2020-12-06 DIAGNOSIS — Y92019 Unspecified place in single-family (private) house as the place of occurrence of the external cause: Secondary | ICD-10-CM | POA: Diagnosis not present

## 2020-12-06 DIAGNOSIS — I491 Atrial premature depolarization: Secondary | ICD-10-CM | POA: Diagnosis not present

## 2020-12-06 DIAGNOSIS — N179 Acute kidney failure, unspecified: Secondary | ICD-10-CM | POA: Diagnosis present

## 2020-12-06 DIAGNOSIS — G9341 Metabolic encephalopathy: Secondary | ICD-10-CM | POA: Diagnosis not present

## 2020-12-06 DIAGNOSIS — Z515 Encounter for palliative care: Secondary | ICD-10-CM

## 2020-12-06 DIAGNOSIS — Z95810 Presence of automatic (implantable) cardiac defibrillator: Secondary | ICD-10-CM

## 2020-12-06 DIAGNOSIS — Z743 Need for continuous supervision: Secondary | ICD-10-CM | POA: Diagnosis not present

## 2020-12-06 DIAGNOSIS — S72002A Fracture of unspecified part of neck of left femur, initial encounter for closed fracture: Secondary | ICD-10-CM | POA: Insufficient documentation

## 2020-12-06 DIAGNOSIS — I130 Hypertensive heart and chronic kidney disease with heart failure and stage 1 through stage 4 chronic kidney disease, or unspecified chronic kidney disease: Secondary | ICD-10-CM | POA: Diagnosis present

## 2020-12-06 DIAGNOSIS — N184 Chronic kidney disease, stage 4 (severe): Secondary | ICD-10-CM | POA: Diagnosis not present

## 2020-12-06 DIAGNOSIS — N189 Chronic kidney disease, unspecified: Secondary | ICD-10-CM | POA: Diagnosis not present

## 2020-12-06 DIAGNOSIS — Z881 Allergy status to other antibiotic agents status: Secondary | ICD-10-CM

## 2020-12-06 DIAGNOSIS — Z01810 Encounter for preprocedural cardiovascular examination: Secondary | ICD-10-CM

## 2020-12-06 DIAGNOSIS — I499 Cardiac arrhythmia, unspecified: Secondary | ICD-10-CM | POA: Diagnosis not present

## 2020-12-06 DIAGNOSIS — S79911A Unspecified injury of right hip, initial encounter: Secondary | ICD-10-CM | POA: Diagnosis present

## 2020-12-06 DIAGNOSIS — I4891 Unspecified atrial fibrillation: Secondary | ICD-10-CM | POA: Diagnosis not present

## 2020-12-06 DIAGNOSIS — S0990XA Unspecified injury of head, initial encounter: Secondary | ICD-10-CM | POA: Diagnosis not present

## 2020-12-06 DIAGNOSIS — I5022 Chronic systolic (congestive) heart failure: Secondary | ICD-10-CM

## 2020-12-06 DIAGNOSIS — Z8679 Personal history of other diseases of the circulatory system: Secondary | ICD-10-CM

## 2020-12-06 LAB — BASIC METABOLIC PANEL
Anion gap: 9 (ref 5–15)
BUN: 33 mg/dL — ABNORMAL HIGH (ref 8–23)
CO2: 26 mmol/L (ref 22–32)
Calcium: 8.5 mg/dL — ABNORMAL LOW (ref 8.9–10.3)
Chloride: 97 mmol/L — ABNORMAL LOW (ref 98–111)
Creatinine, Ser: 1.7 mg/dL — ABNORMAL HIGH (ref 0.44–1.00)
GFR, Estimated: 30 mL/min — ABNORMAL LOW (ref >60)
Glucose, Bld: 126 mg/dL — ABNORMAL HIGH (ref 70–99)
Potassium: 3.7 mmol/L (ref 3.5–5.1)
Sodium: 132 mmol/L — ABNORMAL LOW (ref 135–145)

## 2020-12-06 LAB — BRAIN NATRIURETIC PEPTIDE: B Natriuretic Peptide: 1866 pg/mL — ABNORMAL HIGH (ref 0.0–100.0)

## 2020-12-06 LAB — TYPE AND SCREEN
ABO/RH(D): O POS
Antibody Screen: NEGATIVE

## 2020-12-06 LAB — CBC WITH DIFFERENTIAL/PLATELET
Abs Immature Granulocytes: 0.04 10*3/uL (ref 0.00–0.07)
Basophils Absolute: 0 10*3/uL (ref 0.0–0.1)
Basophils Relative: 1 %
Eosinophils Absolute: 0 10*3/uL (ref 0.0–0.5)
Eosinophils Relative: 1 %
HCT: 33.1 % — ABNORMAL LOW (ref 36.0–46.0)
Hemoglobin: 10.4 g/dL — ABNORMAL LOW (ref 12.0–15.0)
Immature Granulocytes: 1 %
Lymphocytes Relative: 8 %
Lymphs Abs: 0.5 10*3/uL — ABNORMAL LOW (ref 0.7–4.0)
MCH: 23 pg — ABNORMAL LOW (ref 26.0–34.0)
MCHC: 31.4 g/dL (ref 30.0–36.0)
MCV: 73.2 fL — ABNORMAL LOW (ref 80.0–100.0)
Monocytes Absolute: 0.6 10*3/uL (ref 0.1–1.0)
Monocytes Relative: 9 %
Neutro Abs: 5.5 10*3/uL (ref 1.7–7.7)
Neutrophils Relative %: 80 %
Platelets: 225 10*3/uL (ref 150–400)
RBC: 4.52 MIL/uL (ref 3.87–5.11)
RDW: 18.4 % — ABNORMAL HIGH (ref 11.5–15.5)
WBC: 6.8 10*3/uL (ref 4.0–10.5)
nRBC: 0 % (ref 0.0–0.2)

## 2020-12-06 LAB — PROTIME-INR
INR: 1.7 — ABNORMAL HIGH (ref 0.8–1.2)
Prothrombin Time: 19 seconds — ABNORMAL HIGH (ref 11.4–15.2)

## 2020-12-06 LAB — RESP PANEL BY RT-PCR (FLU A&B, COVID) ARPGX2
Influenza A by PCR: NEGATIVE
Influenza B by PCR: NEGATIVE
SARS Coronavirus 2 by RT PCR: NEGATIVE

## 2020-12-06 LAB — TSH: TSH: 14.648 u[IU]/mL — ABNORMAL HIGH (ref 0.350–4.500)

## 2020-12-06 MED ORDER — MAGNESIUM OXIDE 400 (241.3 MG) MG PO TABS
200.0000 mg | ORAL_TABLET | Freq: Two times a day (BID) | ORAL | Status: DC
  Administered 2020-12-06 – 2020-12-11 (×11): 200 mg via ORAL
  Filled 2020-12-06 (×13): qty 1

## 2020-12-06 MED ORDER — MORPHINE SULFATE (PF) 2 MG/ML IV SOLN
0.5000 mg | INTRAVENOUS | Status: DC | PRN
  Administered 2020-12-06 – 2020-12-11 (×10): 0.5 mg via INTRAVENOUS
  Filled 2020-12-06 (×11): qty 1

## 2020-12-06 MED ORDER — TORSEMIDE 20 MG PO TABS
60.0000 mg | ORAL_TABLET | Freq: Every day | ORAL | Status: DC
  Administered 2020-12-06 – 2020-12-10 (×5): 60 mg via ORAL
  Filled 2020-12-06 (×6): qty 3

## 2020-12-06 MED ORDER — SENNOSIDES-DOCUSATE SODIUM 8.6-50 MG PO TABS
1.0000 | ORAL_TABLET | Freq: Every day | ORAL | Status: DC
  Administered 2020-12-06 – 2020-12-11 (×6): 1 via ORAL
  Filled 2020-12-06 (×6): qty 1

## 2020-12-06 MED ORDER — LOPERAMIDE HCL 2 MG PO CAPS: 2.0000 mg | ORAL_CAPSULE | ORAL | Status: DC | PRN

## 2020-12-06 MED ORDER — FLUTICASONE PROPIONATE 50 MCG/ACT NA SUSP
1.0000 | Freq: Every day | NASAL | Status: DC | PRN
  Filled 2020-12-06: qty 16

## 2020-12-06 MED ORDER — AMIODARONE HCL 200 MG PO TABS
200.0000 mg | ORAL_TABLET | Freq: Every day | ORAL | Status: DC
  Administered 2020-12-06 – 2020-12-11 (×6): 200 mg via ORAL
  Filled 2020-12-06 (×7): qty 1

## 2020-12-06 MED ORDER — ONDANSETRON HCL 4 MG PO TABS
4.0000 mg | ORAL_TABLET | Freq: Three times a day (TID) | ORAL | Status: DC | PRN
  Administered 2020-12-06 – 2020-12-07 (×2): 4 mg via ORAL
  Filled 2020-12-06 (×2): qty 1

## 2020-12-06 MED ORDER — SIMETHICONE 80 MG PO CHEW
80.0000 mg | CHEWABLE_TABLET | Freq: Four times a day (QID) | ORAL | Status: DC | PRN
  Administered 2020-12-06: 80 mg via ORAL
  Filled 2020-12-06: qty 1

## 2020-12-06 MED ORDER — MILRINONE LOAD VIA INFUSION
50.0000 ug/kg | Freq: Once | INTRAVENOUS | Status: DC
  Filled 2020-12-06: qty 3

## 2020-12-06 MED ORDER — HYDROMORPHONE HCL 1 MG/ML IJ SOLN
0.5000 mg | Freq: Once | INTRAMUSCULAR | Status: AC
Start: 2020-12-06 — End: 2020-12-06
  Administered 2020-12-06: 0.5 mg via INTRAVENOUS
  Filled 2020-12-06: qty 1

## 2020-12-06 MED ORDER — HYDROCODONE-ACETAMINOPHEN 5-325 MG PO TABS
1.0000 | ORAL_TABLET | Freq: Four times a day (QID) | ORAL | Status: DC | PRN
  Administered 2020-12-06 – 2020-12-08 (×5): 2 via ORAL
  Administered 2020-12-09 – 2020-12-10 (×3): 1 via ORAL
  Filled 2020-12-06: qty 1
  Filled 2020-12-06 (×3): qty 2
  Filled 2020-12-06 (×2): qty 1
  Filled 2020-12-06 (×2): qty 2

## 2020-12-06 MED ORDER — MILRINONE LOAD VIA INFUSION: 50.0000 ug/kg | Freq: Once | INTRAVENOUS | Status: DC

## 2020-12-06 MED ORDER — LEVOTHYROXINE SODIUM 50 MCG PO TABS
50.0000 ug | ORAL_TABLET | Freq: Every day | ORAL | Status: DC
  Administered 2020-12-07 – 2020-12-11 (×5): 50 ug via ORAL
  Filled 2020-12-06 (×6): qty 1

## 2020-12-06 MED ORDER — ALTEPLASE 2 MG IJ SOLR
2.0000 mg | Freq: Once | INTRAMUSCULAR | Status: AC
  Administered 2020-12-06: 2 mg
  Filled 2020-12-06: qty 2

## 2020-12-06 MED ORDER — POTASSIUM CHLORIDE CRYS ER 20 MEQ PO TBCR
40.0000 meq | EXTENDED_RELEASE_TABLET | Freq: Two times a day (BID) | ORAL | Status: DC
  Administered 2020-12-06 – 2020-12-11 (×11): 40 meq via ORAL
  Filled 2020-12-06 (×13): qty 2

## 2020-12-06 MED ORDER — LORAZEPAM 0.5 MG PO TABS
0.5000 mg | ORAL_TABLET | Freq: Three times a day (TID) | ORAL | Status: DC | PRN
  Administered 2020-12-06 – 2020-12-11 (×9): 0.5 mg via ORAL
  Filled 2020-12-06 (×11): qty 1

## 2020-12-06 MED ORDER — SCOPOLAMINE 1 MG/3DAYS TD PT72
1.0000 | MEDICATED_PATCH | TRANSDERMAL | Status: DC
  Administered 2020-12-06 – 2020-12-09 (×2): 1.5 mg via TRANSDERMAL
  Filled 2020-12-06 (×3): qty 1

## 2020-12-06 MED ORDER — HYDROMORPHONE HCL 1 MG/ML IJ SOLN
0.5000 mg | Freq: Once | INTRAMUSCULAR | Status: AC
  Administered 2020-12-06: 0.5 mg via INTRAVENOUS
  Filled 2020-12-06: qty 1

## 2020-12-06 MED ORDER — ZOLPIDEM TARTRATE 5 MG PO TABS
5.0000 mg | ORAL_TABLET | Freq: Every day | ORAL | Status: DC
  Administered 2020-12-06 – 2020-12-11 (×6): 5 mg via ORAL
  Filled 2020-12-06 (×6): qty 1

## 2020-12-06 MED ORDER — MILRINONE LACTATE IN DEXTROSE 20-5 MG/100ML-% IV SOLN
0.5000 ug/kg/min | INTRAVENOUS | Status: DC
  Administered 2020-12-06 – 2020-12-12 (×13): 0.5 ug/kg/min via INTRAVENOUS
  Filled 2020-12-06 (×14): qty 100

## 2020-12-06 MED ORDER — ROPINIROLE HCL 0.5 MG PO TABS
0.5000 mg | ORAL_TABLET | Freq: Every day | ORAL | Status: DC
  Administered 2020-12-06 – 2020-12-11 (×6): 0.5 mg via ORAL
  Filled 2020-12-06 (×6): qty 1

## 2020-12-06 MED ORDER — MIDODRINE HCL 5 MG PO TABS
10.0000 mg | ORAL_TABLET | Freq: Two times a day (BID) | ORAL | Status: DC
  Administered 2020-12-06 – 2020-12-11 (×11): 10 mg via ORAL
  Filled 2020-12-06 (×12): qty 2

## 2020-12-06 MED ORDER — FENTANYL CITRATE (PF) 100 MCG/2ML IJ SOLN: 50.0000 ug | Freq: Once | INTRAMUSCULAR | Status: DC

## 2020-12-06 NOTE — ED Triage Notes (Signed)
Pt brought in by EMS for a fall out of the bed. Pt c/o right hip pain.

## 2020-12-06 NOTE — Consult Note (Signed)
Reason for Consult:right hip fracture Referring Physician: EDP  Cheryl Vargas is an 86 y.o. female.  HPI: 86 yo female with a significant cardiac history who reports a mechanical fall last night at home injuring her right hip.  She complained of immediate right hip pain and was unable to stand or walk after the fall. No other complaints. She denies LOC.   Past Medical History:  Diagnosis Date  . Arthritis   . Chronic combined systolic (congestive) and diastolic (congestive) heart failure ([place])   . Mitral regurgitation   . Non-ischemic cardiomyopathy ([place])   . Persistent atrial fibrillation ([place])   . Ventricular tachycardia [facility])     Past Surgical History:  Procedure Laterality Date  . AV NODE ABLATION N/A 09/07/2017   Procedure: AV NODE ABLATION;  Surgeon: [name], MD;  Location: [facility];  Service: Cardiovascular;  Laterality: N/A;  . BIV ICD INSERTION CRT-D N/A 09/07/2017   Procedure: BIV ICD INSERTION CRT-D;  Surgeon: [name], MD;  Location: [facility];  Service: Cardiovascular;  Laterality: N/A;  . BUNIONECTOMY    . CARDIAC CATHETERIZATION     in 2004 at [facility]. "Insignificant blockage" per patient  . CARDIAC CATHETERIZATION N/A 04/25/2016   Procedure: Left Heart Cath and Coronary Angiography;  Surgeon: Nelva Bush, MD;  Location: [facility];  Service: Cardiovascular;  Laterality: N/A;  . CARDIOVERSION N/A 06/29/2017   Procedure: CARDIOVERSION;  Surgeon: Larey Dresser, MD;  [facility] END[facility];  Service: Cardiovascular;  Laterality: N/A;  . CARDIOVERSION N/A 08/28/2017   Procedure: CARDIOVERSION;  Surgeon: Larey Dresser, MD;  Location: [facility];  Service: Cardiovascular;  Laterality: N/A;  . IR ANGIOGRAM PULMONARY RIGHT SELECTIVE  08/04/2020  . IR ANGIOGRAM SELECTIVE EACH ADDITIONAL VESSEL  08/04/2020  . IR CT SPINE LTD  08/04/2020  . IR EMBO ART  VEN HEMORR LYMPH EXTRAV  INC GUIDE ROADMAPPING  08/04/2020  . IR FLUORO  GUIDE CV LINE RIGHT  08/11/2020  . IR US GUIDE VASC ACCESS RIGHT  08/11/2020  . RADIOLOGY WITH ANESTHESIA N/A 08/04/2020   Procedure: IR WITH ANESTHESIA;  Surgeon: Arne Cleveland, MD;  Location: [place];  Service: Radiology;  Laterality: N/A;  . RIGHT HEART CATH N/A 09/29/2019   Procedure: RIGHT HEART CATH;  Surgeon: Larey Dresser, MD;  Location: [facility];  Service: Cardiovascular;  Laterality: N/A;  . RIGHT HEART CATH N/A 08/04/2020   Procedure: RIGHT HEART CATH;  Surgeon: Larey Dresser, MD;  Location: [facility];  Service: Cardiovascular;  Laterality: N/A;  . SHOULDER SURGERY     closed reduction  . TEE WITHOUT CARDIOVERSION N/A 06/29/2017   Procedure: TRANSESOPHAGEAL ECHOCARDIOGRAM (TEE);  Surgeon: Larey Dresser, MD;  Location: [facility] ENDOSCOPY;  Service: Cardiovascular;  Laterality: N/A;  . TONSILLECTOMY    . TOTAL HIP ARTHROPLASTY Left 03/18/2013   Procedure: TOTAL HIP ARTHROPLASTY ANTERIOR APPROACH;  Surgeon: Mauri Pole, MD;  Location: [facility];  Service: Orthopedics;  Laterality: Left;  . TUBAL LIGATION      Family History  Problem Relation Age of Onset  . Lung cancer Mother   . Heart attack Father     Social History:  reports that she has never smoked. She has never used smokeless tobacco. She reports current alcohol use of about 3.0 standard drinks of alcohol per week. She reports that she does not use drugs.  Allergies:  Allergies  Allergen Reactions  . Sotalol Other (See Comments)  Prolonged QTc  . Alendronate Sodium Other (See Comments)    Arm pain  . Compazine [Prochlorperazine] Other (See Comments)    Nervous reaction  . Dofetilide Nausea Only    Reported by [facility] physicians  . Lisinopril Other (See Comments)    Low bp  . Ranexa [Ranolazine] Nausea Only  . Septra [Sulfamethoxazole-Trimethoprim] Nausea Only  . Zolpidem Tartrate Er Other (See Comments)    Cannot take extended release - makes her too tired the next day  . Ciprofloxacin Other  (See Comments)    Not effective  . Coreg [Carvedilol] Other (See Comments)    Fatigue   . Metoprolol Other (See Comments)    Profound fatigue    Medications: I have reviewed the patient's current medications.  Results for orders placed or performed during the hospital encounter of 12/07/2020 (from the past 48 hour(s))  CBC WITH DIFFERENTIAL     Status: Abnormal   Collection Time: 11/22/2020  5:00 AM  Result Value Ref Range   WBC 6.8 4.0 - 10.5 K/uL   RBC 4.52 3.87 - 5.11 MIL/uL   Hemoglobin 10.4 (L) 12.0 - 15.0 g/dL   HCT 33.1 (L) 36.0 - 46.0 %   MCV 73.2 (L) 80.0 - 100.0 fL   MCH 23.0 (L) 26.0 - 34.0 pg   MCHC 31.4 30.0 - 36.0 g/dL   RDW 18.4 (H) 11.5 - 15.5 %   Platelets 225 150 - 400 K/uL   nRBC 0.0 0.0 - 0.2 %   Neutrophils Relative % 80 %   Neutro Abs 5.5 1.7 - 7.7 K/uL   Lymphocytes Relative 8 %   Lymphs Abs 0.5 (L) 0.7 - 4.0 K/uL   Monocytes Relative 9 %   Monocytes Absolute 0.6 0.1 - 1.0 K/uL   Eosinophils Relative 1 %   Eosinophils Absolute 0.0 0.0 - 0.5 K/uL   Basophils Relative 1 %   Basophils Absolute 0.0 0.0 - 0.1 K/uL   Immature Granulocytes 1 %   Abs Immature Granulocytes 0.04 0.00 - 0.07 K/uL    Comment: Performed at [facility], [address] [place], [place] [address]  Type and screen Ordered by PROVIDER DEFAULT     Status: None (Preliminary result)   Collection Time: 11/26/2020  5:25 AM  Result Value Ref Range   ABO/RH(D) PENDING    Antibody Screen PENDING    Sample Expiration      12/09/2020,2359 Performed at [facility], [place] [address] [place], [place] [address]     CT Head Wo Contrast  Result Date: 11/18/2020 CLINICAL DATA:  Fall, head injury EXAM: CT HEAD WITHOUT CONTRAST TECHNIQUE: Contiguous axial images were obtained from the base of the skull through the vertex without intravenous contrast. COMPARISON:  11/29/2020 FINDINGS: Brain: Normal anatomic configuration. Parenchymal volume loss is commensurate with the patient's age. Mild  periventricular white matter changes are present likely reflecting the sequela of small vessel ischemia. No abnormal intra or extra-axial mass lesion or fluid collection. No abnormal mass effect or midline shift. No evidence of acute intracranial hemorrhage or infarct. Ventricular size is normal. Cerebellum unremarkable. Vascular: No asymmetric hyperdense vasculature at the skull base. Skull: Intact Sinuses/Orbits: Paranasal sinuses are clear. Orbits are unremarkable. Other: Mastoid air cells and middle ear cavities are clear. IMPRESSION: No acute intracranial abnormality. No calvarial fracture. Mild senescent change. Electronically Signed   By: Fidela Salisbury MD   On: 12/03/2020 04:09   DG Hip Unilat With Pelvis 2-3 Views Right  Result  Date: 11/30/2020 CLINICAL DATA:  Fall with hip pain EXAM: DG HIP (WITH OR WITHOUT PELVIS) 2-3V RIGHT COMPARISON:  08/13/2020 FINDINGS: Impacted subcapital right femoral neck fracture. Left hip arthroplasty which is intact where covered. No evidence of pelvic ring fracture or diastasis. IMPRESSION: Impacted, subcapital femoral neck fracture on the right. Electronically Signed   By: Monte Fantasia M.D.   On: 11/28/2020 04:18    Review of Systems Blood pressure (!) 132/96, pulse 83, temperature 97.6 F (36.4 C), temperature source Oral, resp. rate 18, height 5\' 5"  (1.651 m), weight 56.2 kg, SpO2 91 %. Physical Exam Patient in moderate distress on ED stretcher, alert and oriented. Neck and back non tender with no deformity. Abdomen soft Chest wall non tender Bilateral UEs with pain free AROM and normal strength Right LE short and externally rotated. Pain with PROM. Knee and ankle nontender Left LE with pain free AROM NVI bilateral LEs  Assessment/Plan: Displaced right femoral neck fracture after mechanical fall. Patient with severe cardiac issues with AICD in place and requires cardiac drips and has indwelling central line Patient also on Xarelto. Will need medical  and cardiac clearance prior to surgery this week.  She will need an arthroplasty for that hip. I will discuss with Dr Alvan Dame who did her other hip to see what the timing looks like pending medical clearance.   Augustin Schooling 11/29/2020, 5:56 AM

## 2020-12-06 NOTE — ED Provider Notes (Signed)
[place] EMERGENCY DEPARTMENT Provider Note   CSN: [id number] Arrival date & time: 11/22/2020  0324     History Chief Complaint  Patient presents with  . Hip Pain  . Fall    Cheryl Vargas is a 86 y.o. female with a hx of CHF with AICD in place with milrinone drip , persistent atrial fibrillation, hyperlipidemia, mitral regurgitation, prior ventricular tachycardia, CKD, chronic anticoagulation on Xarelto, and prior left hip total arthroplasty by Dr. Alvan Dame who presents to the ED S/p mechanical fall shortly PTA tonight. Patient states she was stepping out of bed and slipped and fell onto her right hip. She did bump her head but did not have LOC. She reports pain to the right hip, no other significant areas of discomfort, worse with movement, alleviated mildly with fentanyl en route, had some nausea & did receive zofran by EMS. Denies numbness, tingling, weakness, headache, neck pain, back pain, chest pain, or abdominal pain.   HPI     Past Medical History:  Diagnosis Date  . Arthritis   . Chronic combined systolic (congestive) and diastolic (congestive) heart failure ([place])   . Mitral regurgitation   . Non-ischemic cardiomyopathy ([place])   . Persistent atrial fibrillation ([place])   . Ventricular tachycardia [facility])     Patient Active Problem List   Diagnosis Date Noted  . Protein-calorie malnutrition, severe 10/28/2020  . Acute exacerbation of CHF (congestive heart failure) ([place]) 10/22/2020  . Acute on chronic combined systolic and diastolic CHF (congestive heart failure) ([place])   . Palliative care encounter   . Goals of care, counseling/discussion   . Encounter for hospice care discussion   . Acute on chronic systolic (congestive) heart failure ([place]) 08/02/2020  . Malnutrition of moderate degree 02/03/2020  . DNR (do not resuscitate) discussion   . Palliative care by specialist   . UTI (urinary tract infection) 02/01/2020  . Prolonged QT interval 02/01/2020  .  Chronic anticoagulation 02/01/2020  . Transient hypotension 02/01/2020  . History of shingles 02/01/2020  . Near syncope 05/05/2019  . Hypercholesterolemia without hypertriglyceridemia 02/13/2018  . Syncope 01/21/2018  . Constipation 01/21/2018  . CHF exacerbation ([place]) 12/18/2017  . CKD (chronic kidney disease), stage III ([place]) 12/16/2017  . SOB (shortness of breath)   . Abdominal pain 09/18/2017  . CHF (congestive heart failure) ([place]) 09/17/2017  . Pulmonary edema   . Cardiac device in situ   . Ventricular tachycardia ([place])   . Cardiac arrest ([place])   . Severe mitral valve regurgitation   . A-fib ([place]) 09/03/2017  . Mitral regurgitation   . Acute on chronic systolic heart failure ([place])   . Non-ischemic cardiomyopathy ([place])   . CHF (congestive heart failure), NYHA class IV (B and E) 04/24/2016  . Dizziness 12/01/2015  . Hypertension 12/01/2015  . Insomnia 12/01/2015  . Chronic combined systolic and diastolic CHF (congestive heart failure) ([place]) 12/01/2015  . Paroxysmal atrial fibrillation (H[facility])   . Cardiac arrhythmia 10/20/2014  . Systolic dysfunction 70/62/3762  . Hypoventilation, idiopathic 10/20/2014  . Weakness 10/20/2014  . Closed left hip fracture ([place]) 03/17/2013  . Cardiomegaly 03/17/2013  . Frequent PVCs 03/17/2013  . Hyperlipidemia 03/17/2013    Past Surgical History:  Procedure Laterality Date  . AV NODE ABLATION N/A 09/07/2017   Procedure: AV NODE ABLATION;  Surgeon: [name], MD;  Location: [place] CV LAB;  Service: Cardiovascular;  Laterality: N/A;  . BIV ICD INSERTION CRT-D N/A 09/07/2017   Procedure: BIV ICD INSERTION CRT-D;  Surgeon:  [name], MD;  Location: [place] CV LAB;  Service: Cardiovascular;  Laterality: N/A;  . BUNIONECTOMY    . CARDIAC CATHETERIZATION     in 2004 at [facility]. "Insignificant blockage" per patient  . CARDIAC CATHETERIZATION N/A 04/25/2016   Procedure: Left Heart Cath and Coronary Angiography;  Surgeon: Nelva Bush, MD;  Location: [place] CV LAB;  Service: Cardiovascular;  Laterality: N/A;  . CARDIOVERSION N/A 06/29/2017   Procedure: CARDIOVERSION;  Surgeon: Larey Dresser, MD;  Location: [facility] ENDOSCOPY;  Service: Cardiovascular;  Laterality: N/A;  . CARDIOVERSION N/A 08/28/2017   Procedure: CARDIOVERSION;  Surgeon: Larey Dresser, MD;  Location: [place];  Service: Cardiovascular;  Laterality: N/A;  . IR ANGIOGRAM PULMONARY RIGHT SELECTIVE  08/04/2020  . IR ANGIOGRAM SELECTIVE EACH ADDITIONAL VESSEL  08/04/2020  . IR CT SPINE LTD  08/04/2020  . IR EMBO ART  VEN HEMORR LYMPH EXTRAV  INC GUIDE ROADMAPPING  08/04/2020  . IR FLUORO GUIDE CV LINE RIGHT  08/11/2020  . IR US GUIDE VASC ACCESS RIGHT  08/11/2020  . RADIOLOGY WITH ANESTHESIA N/A 08/04/2020   Procedure: IR WITH ANESTHESIA;  Surgeon: Arne Cleveland, MD;  Location: [place];  Service: Radiology;  Laterality: N/A;  . RIGHT HEART CATH N/A 09/29/2019   Procedure: RIGHT HEART CATH;  Surgeon: Larey Dresser, MD;  Location: [place] CV LAB;  Service: Cardiovascular;  Laterality: N/A;  . RIGHT HEART CATH N/A 08/04/2020   Procedure: RIGHT HEART CATH;  Surgeon: Larey Dresser, MD;  Location: [place] CV LAB;  Service: Cardiovascular;  Laterality: N/A;  . SHOULDER SURGERY     closed reduction  . TEE WITHOUT CARDIOVERSION N/A 06/29/2017   Procedure: TRANSESOPHAGEAL ECHOCARDIOGRAM (TEE);  Surgeon: Larey Dresser, MD;  Location: [facility] ENDOSCOPY;  Service: Cardiovascular;  Laterality: N/A;  . TONSILLECTOMY    . TOTAL HIP ARTHROPLASTY Left 03/18/2013   Procedure: TOTAL HIP ARTHROPLASTY ANTERIOR APPROACH;  Surgeon: Mauri Pole, MD;  Location: [facility] ORS;  Service: Orthopedics;  Laterality: Left;  . TUBAL LIGATION       OB History   No obstetric history on file.     Family History  Problem Relation Age of Onset  . Lung cancer Mother   . Heart attack Father     Social History   Tobacco Use  . Smoking status: Never Smoker  . Smokeless  tobacco: Never Used  Vaping Use  . Vaping Use: Never used  Substance Use Topics  . Alcohol use: Yes    Alcohol/week: 3.0 standard drinks    Types: 3 Glasses of wine per week    Comment: per week  . Drug use: No    Home Medications Prior to Admission medications   Medication Sig Start Date End Date Taking? Authorizing Provider  acetaminophen (TYLENOL) 500 MG tablet Take 500-1,000 mg by mouth every 6 (six) hours as needed for headache (pain).    [provider]  amiodarone (PACERONE) 200 MG tablet Take 1 tablet (200 mg total) by mouth daily. 10/30/20   Regalado, Jerald Kief A, MD  Calcium Carbonate Antacid (TUMS ULTRA 1000 PO) Take 1,000 mg by mouth 2 (two) times daily as needed (heartburn/indigestion).    [provider]  fluticasone (FLONASE) 50 MCG/ACT nasal spray Place 1 spray into both nostrils daily as needed for allergies or rhinitis (seasonal allergies).    [provider]  levothyroxine (SYNTHROID) 50 MCG tablet Take 1 tablet (50 mcg total) by mouth daily before breakfast. 10/31/20  Regalado, Belkys A, MD  loperamide (IMODIUM A-D) 2 MG tablet Take 2 mg by mouth as needed for diarrhea or loose stools.    [provider]  LORazepam (ATIVAN) 0.5 MG tablet Take 1 tablet (0.5 mg total) by mouth every 8 (eight) hours as needed for anxiety (anxiety,SOB). 10/30/20   Regalado, Belkys A, MD  magnesium oxide (MAG-OX) 400 MG tablet Take 0.5 tablets (200 mg total) by mouth 2 (two) times daily. 10/30/20   Regalado, Belkys A, MD  midodrine (PROAMATINE) 10 MG tablet Take 1 tablet (10 mg total) by mouth 2 (two) times daily with a meal. 10/30/20   Regalado, Belkys A, MD  pantoprazole (PROTONIX) 40 MG tablet Take 1 tablet (40 mg total) by mouth daily. 08/18/20   Clegg, [name] D, NP  polyethylene glycol (MIRALAX / GLYCOLAX) 17 g packet Take 17 g by mouth 2 (two) times daily. Patient not taking: No sig reported 10/30/20   Regalado, Belkys A, MD  potassium chloride SA (KLOR-CON) 20  MEQ tablet Take 2 tablets (40 mEq total) by mouth 2 (two) times daily. 11/09/20   Larey Dresser, MD  Rivaroxaban (XARELTO) 15 MG TABS tablet Take 1 tablet (15 mg total) by mouth daily with supper. 10/18/20   [place], Maricela Bo, FNP  rOPINIRole (REQUIP) 0.5 MG tablet Take 1 tablet (0.5 mg total) by mouth daily at 8 pm. 10/30/20   Regalado, Jerald Kief A, MD  sennosides-docusate sodium (SENOKOT-S) 8.6-50 MG tablet Take 1 tablet by mouth daily as needed for constipation.    [provider]  torsemide (DEMADEX) 20 MG tablet Take 3 tablets (60 mg total) by mouth daily. 11/29/20   Lucrezia Starch, MD  zolpidem (AMBIEN) 10 MG tablet Take 10 mg by mouth at bedtime. 11/12/20   [provider]  furosemide (LASIX) 20 MG tablet Take 3 tablets (60 mg total) by mouth 2 (two) times daily. 11/23/20 11/29/20  Larey Dresser, MD    Allergies    Sotalol, Alendronate sodium, Compazine [prochlorperazine], Dofetilide, Lisinopril, Ranexa [ranolazine], Septra [sulfamethoxazole-trimethoprim], Zolpidem tartrate er, Ciprofloxacin, Coreg [carvedilol], and Metoprolol  Review of Systems   Review of Systems  Constitutional: Negative for chills and fever.  Eyes: Negative for visual disturbance.  Respiratory: Negative for shortness of breath.   Cardiovascular: Negative for chest pain.  Gastrointestinal: Negative for abdominal pain and vomiting.  Musculoskeletal: Positive for arthralgias. Negative for back pain and neck pain.  Neurological: Negative for syncope.  All other systems reviewed and are negative.   Physical Exam Updated Vital Signs BP (!) 132/96   Pulse 83   Temp 97.6 F (36.4 C) (Oral)   Resp 18   Ht 5\' 5"  (1.651 m)   Wt 56.2 kg   SpO2 91%   BMI 20.63 kg/m   Physical Exam Vitals and nursing note reviewed.  Constitutional:      General: She is not in acute distress.    Appearance: She is well-developed. She is not toxic-appearing.  HENT:     Head: Normocephalic and atraumatic.      Comments: No raccoon eyes or battle sign. Eyes:     General:        Right eye: No discharge.        Left eye: No discharge.     Conjunctiva/sclera: Conjunctivae normal.  Cardiovascular:     Rate and Rhythm: Normal rate and regular rhythm.     Comments: 2+ symmetric DP pulses bilaterally.  Left chest wall AICD present.  R chest wall  catheter in place Pulmonary:     Effort: Pulmonary effort is normal. No respiratory distress.     Breath sounds: Normal breath sounds. No wheezing, rhonchi or rales.  Chest:     Chest wall: No tenderness.  Abdominal:     General: There is no distension.     Palpations: Abdomen is soft.     Tenderness: There is no abdominal tenderness. There is no guarding or rebound.  Musculoskeletal:     Cervical back: Neck supple.     Comments: Upper extremities: Able to actively range all major joints.  No focal areas of bony tenderness Back: No midline tenderness or palpable step-off Lower extremities: RLE with mild external rotation. No significant open wounds or ecchymosis noted.  Patient is able to lift the left lower extremity off of the bed without difficulty and fully flex/extend the left knee.  Right hip and knee limitation is limited secondary to pain in the right hip.  She is able to move bilateral ankles without difficulty as well as all digits.  She is tender to palpation to the right anterior hip.  Otherwise nontender.  Skin:    General: Skin is warm and dry.     Findings: No rash.  Neurological:     Mental Status: She is alert.     Comments: Clear speech.  CN III through XII mostly intact.  Sensation grossly intact bilateral upper & lower extremities.  5-5 strength with grip strength and with plantar dorsiflexion bilaterally.  Psychiatric:        Behavior: Behavior normal.     ED Results / Procedures / Treatments   Labs (all labs ordered are listed, but only abnormal results are displayed) Labs Reviewed  BASIC METABOLIC PANEL - Abnormal; Notable for  the following components:      Result Value   Sodium 132 (*)    Chloride 97 (*)    Glucose, Bld 126 (*)    BUN 33 (*)    Creatinine, Ser 1.70 (*)    Calcium 8.5 (*)    GFR, Estimated 30 (*)    All other components within normal limits  CBC WITH DIFFERENTIAL/PLATELET - Abnormal; Notable for the following components:   Hemoglobin 10.4 (*)    HCT 33.1 (*)    MCV 73.2 (*)    MCH 23.0 (*)    RDW 18.4 (*)    Lymphs Abs 0.5 (*)    All other components within normal limits  PROTIME-INR - Abnormal; Notable for the following components:   Prothrombin Time 19.0 (*)    INR 1.7 (*)    All other components within normal limits  RESP PANEL BY RT-PCR (FLU A&B, COVID) [id number]  TYPE AND SCREEN    EKG EKG Interpretation  Date/Time:  Monday December 06 2020 05:10:37 EDT Ventricular Rate:  80 PR Interval:    QRS Duration: 160 QT Interval:  482 QTC Calculation: 557 R Axis:   132 Text Interpretation: VENTRICULAR PACED RHYTHM When compared with ECG of 11/29/2020, No significant change was found Confirmed by Delora Fuel ([id number]) on 11/25/2020 5:14:05 AM   Radiology CT Head Wo Contrast  Result Date: 11/27/2020 CLINICAL DATA:  Fall, head injury EXAM: CT HEAD WITHOUT CONTRAST TECHNIQUE: Contiguous axial images were obtained from the base of the skull through the vertex without intravenous contrast. COMPARISON:  11/29/2020 FINDINGS: Brain: Normal anatomic configuration. Parenchymal volume loss is commensurate with the patient's age. Mild periventricular white matter changes are present likely reflecting the sequela of small vessel  ischemia. No abnormal intra or extra-axial mass lesion or fluid collection. No abnormal mass effect or midline shift. No evidence of acute intracranial hemorrhage or infarct. Ventricular size is normal. Cerebellum unremarkable. Vascular: No asymmetric hyperdense vasculature at the skull base. Skull: Intact Sinuses/Orbits: Paranasal sinuses are clear. Orbits are unremarkable. Other:  Mastoid air cells and middle ear cavities are clear. IMPRESSION: No acute intracranial abnormality. No calvarial fracture. Mild senescent change. Electronically Signed   By: Fidela Salisbury MD   On: 11/14/2020 04:09   DG Hip Unilat With Pelvis 2-3 Views Right  Result Date: 11/21/2020 CLINICAL DATA:  Fall with hip pain EXAM: DG HIP (WITH OR WITHOUT PELVIS) 2-3V RIGHT COMPARISON:  08/13/2020 FINDINGS: Impacted subcapital right femoral neck fracture. Left hip arthroplasty which is intact where covered. No evidence of pelvic ring fracture or diastasis. IMPRESSION: Impacted, subcapital femoral neck fracture on the right. Electronically Signed   By: Monte Fantasia M.D.   On: 12/07/2020 04:18    Procedures Procedures   Medications Ordered in ED Medications - No data to display  ED Course  I have reviewed the triage vital signs and the nursing notes.  Pertinent labs & imaging results that were available during my care of the patient were reviewed by me and considered in my medical decision making (see chart for details).    MDM Rules/Calculators/A&P                         Patient presents to the ED with complaints of right hip pain S/p mechanical fall.   Additional history obtained:  Additional history obtained from chart review & nursing note review.   Imaging Studies ordered:  I ordered imaging studies which included CT head without contrast as well as a right hip x-ray, I independently reviewed, formal radiology impression shows:  CT head wo: No acute intracranial abnormality. No calvarial fracture. Mild senescent change R hip x-ray :Impacted, subcapital femoral neck fracture on the right  05:15: CONSULT: Discussed with orthopedic surgoen Dr. Veverly Fells- does not need to be NPO, no OR today with he xarelto yesterday @ 1700, will see in consultation. Appreciate input.   EKG: VENTRICULAR PACED RHYTHM When compared with ECG of 11/29/2020, No significant change was found   Lab Tests:  I  Ordered, reviewed, and interpreted labs, which included:  CBC: Anemia similar to prior BMP: Mildly worsening renal function and mild electrolyte abnormalities. PT/INR: Mild elevation similar to prior ranges.  06:00: Dr. Veverly Fells present in the ED evaluating patient.   06:27: CONSULT: Discussed with hospitalist service Dr. Cyd Silence- accepts admission.   Portions of this note were generated with [employer]. Dictation errors may occur despite best attempts at proofreading.  Final Clinical Impression(s) / ED Diagnoses Final diagnoses:  Fall, initial encounter  Closed displaced fracture of right femoral neck [facility]    Rx / DC Orders ED Discharge Orders    None       Leafy Kindle 36/62/94 [id number]    Delora Fuel, MD 65/03/54 [phone]

## 2020-12-06 NOTE — Plan of Care (Signed)

## 2020-12-06 NOTE — Progress Notes (Signed)
No ICM remote transmission received for 11/16/2020 due to hospitalization and next ICM transmission scheduled for 12/20/2020.

## 2020-12-06 NOTE — ED Notes (Signed)
Cheryl Vargas, son, [phone] would like an update when available

## 2020-12-06 NOTE — ED Notes (Signed)
Placed on 2L [place] due to oxygen level in 60's after dilaudid

## 2020-12-06 NOTE — ED Notes (Signed)
Took over patient care.

## 2020-12-06 NOTE — ED Notes (Signed)
Na x 1 

## 2020-12-06 NOTE — Progress Notes (Signed)
Patient ID: [name], female   DOB: [birth year], 86 y.o.   MRN: [id number]    Advanced Heart Failure Team Consult Note   Primary Physician: Cheryl Orn, Vargas PCP-Cardiologist:  Cheryl Grayer, Vargas  Reason for Consultation: End stage CHF, hip fracture  HPI:    Cheryl Vargas is seen today for cardiac evaluation in setting of hip fracture at the request of Dr. Tamala Vargas.   Patient has a past medical history of HTN, HLD, PAF (On Xarelto), and nonischemic cardiomyopathy with chronic systolic CHF. Cardiomyopathy dates back to at least 2014 based on echoes (EF 30-35% in 2014).    Admitted 8/17 for fatigue and dyspnea. She was seen by electrophysiology on 04/17/16, and was started on po Amiodarone as she reported increased palpitations and fatigue with frequent PVCs. Her BNP was elevated at 1592.6 and chest x ray was consistent with CHF. She was diuresed. With her complaints of new onset fatigue and dyspnea combined with her known LV dysfunction, it was felt that she would benefit from an ischemic evaluation by cath. She underwent left heart cath with normal cors. Amiodarone was later stopped due to nausea. Discharge weight 143 pounds. Echo (8/17) with EF 40-45%, inferoseptal akinesis. Cardiac MRI 10/17 with EF 36%, LGE mid wall pattern in the basal to mid septum and inferior wall. Holter monitor in 9/17 with runs of atrial fibrillation/RVR.  She has had trouble tolerating cardiac meds due to hypotension/lightheadedness.   She has had difficulty with symptomatic atrial fibrillation. She developed symptomatic atrial fibrillation with RVR again in 10/18, difficult to control rate. She had TEE-guided DCCV with resumption of NSR. On TEE in rapid atrial fibrillation, EF 20-25%. On TTE after DCCV, EF back to 40-45% range. MR reported as moderate to severe but I reviewed echo and think it appears more in the moderate range.   She had recurrent atrial fibrillation after 10/18 DCCV. She was admitted  in 12/18 with atrial fibrillation and RVR, she also had a run of WCT thought to be VT. The atrial fibrillation was very difficult to control. She was thought to be a poor candidate for atrial fibrillation ablation. She finally had AV nodal ablation with Medtronic CRT-D device in 12/18. She was then admitted briefly with CHF in 1/19. Echo in 12/18 showed EF 25-30%, moderate MR, moderate TR, severe LAE. Echo in 4/19 showed EF 25-30%, diffuse hypokinesis, mild MR, moderately decreased RV systolic function.   Admitted 4/7 - 12/21/17 with A/C CHF. Found to have decreased BiV pacing in setting of frequent PVCs. Tried on Mexiletine but failed due to nausea. Switched to Ranexa. Diuresed with IV lasix.   In 5/19, she was admitted with a near-syncopal episode that occurred while straining for a bowel movement. Suspected vagal event.   She has stopped ranolazine due to nausea. She did not tolerate increase in bisoprolol.   She was admitted in 8/20 with presyncope; she would get abdominal pain followed by a feeling of warmth and diaphoresis, then she would get dizzy. No syncope. No explanatory arrhythmias were seen on device interrogation or telemetry in the hospital. She was not orthostatic or dry. Thought to be possibly a vagal response to GI trigger (dyspepsia). Mesenteric dopplers in 8/20 were normal. She feels like Pepcid has helped.   She was admitted in 1/21 with VT and ICD discharge. RHC showed low output with CI 1.72. Echo showed EF <20%, moderate LV dilation, mild RV dilation with decreased systolic function, mild-moderate MR. She was started on amiodarone  and digoxin.   She was admitted in 5/21 with UTI and hypotension. Losartan and spironolactone were stopped.   Echo in 12/21 showed EF 20-25% with severe MR, severe TR, and moderate AI. Patient had [place] in 12/21 due to concern for low output HF. RHC did show low cardiac output, but this was complicated by localized branch PA  perforation requiring coil embolization by IR. She was started on milrinone with improvement in symptoms though was still very weak. Given frailty, she was thought to be poor candidate for LVAD placement. We sent her home on milrinone with home health with plan to transition to hospice with any worsening.   Telehealth visit (12/21) she remainedon milrinone 0.25 with [facility] home PT following. Taking Ensure 1-2 times/day. Using rollator to get around the house without much dypsnea but she continues to fatigue easily. She is back on amiodarone for frequent PVCs.   In January Optivol was suggestive of fluid accumulation. On 2/1 I recommended increasing lasix to 40 mg twice a day x 5 days then back to lasix 40 mg/20 mg. She canceled appointment with me on 10/14/20. On 2/7 suspected UTI. Prescribed Cipro but never started due to allergy.   Presented to [facility](10/22/20)with fatigue and leg weakness. She was seen by palliative care while in the ER and transitioned over to full hospice care.   She has been at home with hospice care. She had a mechanical fall last night while getting up to bedside commode, she now has a displaced right femoral neck fracture.  She denies lightheadedness or syncope. Currently lying in bed, no dyspnea at rest. Very frail, she was not doing much at home prior to this admission.   Review of Systems: All systems reviewed and negative except as per HPI.   Home Medications Prior to Admission medications   Medication Sig Start Date End Date Taking? Authorizing Provider  acetaminophen (TYLENOL) 500 MG tablet Take 500-1,000 mg by mouth every 6 (six) hours as needed for headache (pain).   Yes Provider, Historical, Vargas  amiodarone (PACERONE) 200 MG tablet Take 1 tablet (200 mg total) by mouth daily. 10/30/20  Yes Vargas, Cheryl A, Vargas  Calcium Carbonate Antacid (TUMS ULTRA 1000 PO) Take 1,000 mg by mouth 2 (two) times daily as needed (heartburn/indigestion).   Yes Provider,  Historical, Vargas  fluticasone (FLONASE) 50 MCG/ACT nasal spray Place 1 spray into both nostrils daily as needed for allergies or rhinitis (seasonal allergies).   Yes Provider, Historical, Vargas  levothyroxine (SYNTHROID) 50 MCG tablet Take 1 tablet (50 mcg total) by mouth daily before breakfast. 10/31/20  Yes Vargas, Cheryl A, Vargas  loperamide (IMODIUM A-D) 2 MG tablet Take 2 mg by mouth as needed for diarrhea or loose stools.   Yes Provider, Historical, Vargas  LORazepam (ATIVAN) 0.5 MG tablet Take 1 tablet (0.5 mg total) by mouth every 8 (eight) hours as needed for anxiety (anxiety,SOB). 10/30/20  Yes Vargas, Cheryl A, Vargas  magnesium oxide (MAG-OX) 400 MG tablet Take 0.5 tablets (200 mg total) by mouth 2 (two) times daily. 10/30/20  Yes Vargas, Cheryl A, Vargas  midodrine (PROAMATINE) 10 MG tablet Take 1 tablet (10 mg total) by mouth 2 (two) times daily with a meal. 10/30/20  Yes Vargas, Cheryl A, Vargas  ondansetron (ZOFRAN) 4 MG tablet Take 4 mg by mouth every 12 (twelve) hours as needed for nausea or vomiting.   Yes Provider, Historical, Vargas  pantoprazole (PROTONIX) 40 MG tablet Take 1 tablet (40 mg total) by mouth daily.  08/18/20  Yes Clegg, [name] D, NP  potassium chloride SA (KLOR-CON) 20 MEQ tablet Take 2 tablets (40 mEq total) by mouth 2 (two) times daily. 11/09/20  Yes Larey Dresser, Vargas  Rivaroxaban (XARELTO) 15 MG TABS tablet Take 1 tablet (15 mg total) by mouth daily with supper. 10/18/20  Yes Milford, Maricela Bo, FNP  rOPINIRole (REQUIP) 0.5 MG tablet Take 1 tablet (0.5 mg total) by mouth daily at 8 pm. 10/30/20  Yes Vargas, Cheryl A, Vargas  sennosides-docusate sodium (SENOKOT-S) 8.6-50 MG tablet Take 1 tablet by mouth at bedtime.   Yes Provider, Historical, Vargas  torsemide (DEMADEX) 20 MG tablet Take 3 tablets (60 mg total) by mouth daily. 11/29/20  Yes Lucrezia Starch, Vargas  zolpidem (AMBIEN) 10 MG tablet Take 10 mg by mouth at bedtime. 11/12/20  Yes Provider, Historical, Vargas  polyethylene glycol (MIRALAX /  GLYCOLAX) 17 g packet Take 17 g by mouth 2 (two) times daily. Patient not taking: No sig reported 10/30/20   Vargas, Cheryl A, Vargas  furosemide (LASIX) 20 MG tablet Take 3 tablets (60 mg total) by mouth 2 (two) times daily. 11/23/20 11/29/20  Larey Dresser, Vargas    Past Medical History: Past Medical History:  Diagnosis Date  . Arthritis   . Chronic combined systolic (congestive) and diastolic (congestive) heart failure ([place])   . Mitral regurgitation   . Non-ischemic cardiomyopathy ([place])   . Persistent atrial fibrillation ([place])   . Ventricular tachycardia [facility])     Past Surgical History: Past Surgical History:  Procedure Laterality Date  . AV NODE ABLATION N/A 09/07/2017   Procedure: AV NODE ABLATION;  Surgeon: Cheryl Grayer, Vargas;  Location: [place] [facility];  Service: Cardiovascular;  Laterality: N/A;  . BIV ICD INSERTION CRT-D N/A 09/07/2017   Procedure: BIV ICD INSERTION CRT-D;  Surgeon: Cheryl Grayer, Vargas;  Location: [place] [facility];  Service: Cardiovascular;  Laterality: N/A;  . BUNIONECTOMY    . CARDIAC CATHETERIZATION     in 2004 at [facility]. "Insignificant blockage" per patient  . CARDIAC CATHETERIZATION N/A 04/25/2016   Procedure: Left Heart Cath and Coronary Angiography;  Surgeon: Nelva Bush, Vargas;  Location: [place] [facility];  Service: Cardiovascular;  Laterality: N/A;  . CARDIOVERSION N/A 06/29/2017   Procedure: CARDIOVERSION;  Surgeon: Larey Dresser, Vargas;  Location: [facility] ENDOSCOPY;  Service: Cardiovascular;  Laterality: N/A;  . CARDIOVERSION N/A 08/28/2017   Procedure: CARDIOVERSION;  Surgeon: Larey Dresser, Vargas;  Location: [place];  Service: Cardiovascular;  Laterality: N/A;  . IR ANGIOGRAM PULMONARY RIGHT SELECTIVE  08/04/2020  . IR ANGIOGRAM SELECTIVE EACH ADDITIONAL VESSEL  08/04/2020  . IR CT SPINE LTD  08/04/2020  . IR EMBO ART  VEN HEMORR LYMPH EXTRAV  INC GUIDE ROADMAPPING  08/04/2020  . IR FLUORO GUIDE CV LINE RIGHT  08/11/2020  . IR US GUIDE VASC  ACCESS RIGHT  08/11/2020  . RADIOLOGY WITH ANESTHESIA N/A 08/04/2020   Procedure: IR WITH ANESTHESIA;  Surgeon: Arne Cleveland, Vargas;  Location: [place];  Service: Radiology;  Laterality: N/A;  . RIGHT HEART CATH N/A 09/29/2019   Procedure: RIGHT HEART CATH;  Surgeon: Larey Dresser, Vargas;  Location: [place] [facility];  Service: Cardiovascular;  Laterality: N/A;  . RIGHT HEART CATH N/A 08/04/2020   Procedure: RIGHT HEART CATH;  Surgeon: Larey Dresser, Vargas;  Location: [place] [facility];  Service: Cardiovascular;  Laterality: N/A;  . SHOULDER SURGERY     closed reduction  . TEE  WITHOUT CARDIOVERSION N/A 06/29/2017   Procedure: TRANSESOPHAGEAL ECHOCARDIOGRAM (TEE);  Surgeon: Larey Dresser, Vargas;  Location: [facility] ENDOSCOPY;  Service: Cardiovascular;  Laterality: N/A;  . TONSILLECTOMY    . TOTAL HIP ARTHROPLASTY Left 03/18/2013   Procedure: TOTAL HIP ARTHROPLASTY ANTERIOR APPROACH;  Surgeon: Mauri Pole, Vargas;  Location: [facility] ORS;  Service: Orthopedics;  Laterality: Left;  . TUBAL LIGATION      Family History: Family History  Problem Relation Age of Onset  . Lung cancer Mother   . Heart attack Father     Social History: Social History   Socioeconomic History  . Marital status: Widowed    Spouse name: Not on file  . Number of children: Not on file  . Years of education: Not on file  . Highest education level: Not on file  Occupational History  . Not on file  Tobacco Use  . Smoking status: Never Smoker  . Smokeless tobacco: Never Used  Vaping Use  . Vaping Use: Never used  Substance and Sexual Activity  . Alcohol use: Yes    Alcohol/week: 3.0 standard drinks    Types: 3 Glasses of wine per week    Comment: per week  . Drug use: No  . Sexual activity: Not on file  Other Topics Concern  . Not on file  Social History Narrative  . Not on file   Social Determinants of Health   Financial Resource Strain: Not on file  Food Insecurity: Not on file  Transportation Needs: Not on file   Physical Activity: Not on file  Stress: Not on file  Social Connections: Not on file    Allergies:  Allergies  Allergen Reactions  . Sotalol Other (See Comments)    Prolonged QTc  . Alendronate Sodium Other (See Comments)    Arm pain  . Compazine [Prochlorperazine] Other (See Comments)    Nervous reaction  . Dofetilide Nausea Only    Reported by [facility] physicians  . Lisinopril Other (See Comments)    Low bp  . Ranexa [Ranolazine] Nausea Only  . Septra [Sulfamethoxazole-Trimethoprim] Nausea Only  . Zolpidem Tartrate Er Other (See Comments)    Cannot take extended release - makes her too tired the next day  . Ciprofloxacin Other (See Comments)    Not effective  . Coreg [Carvedilol] Other (See Comments)    Fatigue   . Metoprolol Other (See Comments)    Profound fatigue    Objective:    Vital Signs:   Temp:  [97.6 F (36.4 C)-97.9 F (36.6 C)] 97.9 F (36.6 C) (03/28 1020) Pulse Rate:  [80-83] 81 (03/28 1020) Resp:  [14-18] 17 (03/28 1020) BP: (123-139)/(65-96) 133/71 (03/28 1020) SpO2:  [91 %-97 %] 95 % (03/28 1020) Weight:  [56.2 kg] 56.2 kg (03/28 0333)    Weight change: Filed Weights   11/21/2020 0333  Weight: 56.2 kg    Intake/Output:  No intake or output data in the 24 hours ending 11/25/2020 1238    Physical Exam    General:  Frail HEENT: normal Neck: supple. JVP not elevated. Carotids 2+ bilat; no bruits. No lymphadenopathy or thyromegaly appreciated. Cor: PMI nondisplaced. Regular rate & rhythm. No rubs, gallops or murmurs. Lungs: clear Abdomen: soft, nontender, nondistended. No hepatosplenomegaly. No bruits or masses. Good bowel sounds. Extremities: no cyanosis, clubbing, rash, edema Neuro: alert & orientedx3, cranial nerves grossly intact. moves all 4 extremities w/o difficulty. Affect pleasant   EKG    BiV paced, atrial fibrillation (personally reviewed)  Labs   Basic Metabolic Panel: Recent Labs  Lab 11/22/2020 0500  NA 132*  K 3.7   CL 97*  CO2 26  GLUCOSE 126*  BUN 33*  CREATININE 1.70*  CALCIUM 8.5*    Liver Function Tests: No results for input(s): AST, ALT, ALKPHOS, BILITOT, PROT, ALBUMIN in the last 168 hours. No results for input(s): LIPASE, AMYLASE in the last 168 hours. No results for input(s): AMMONIA in the last 168 hours.  CBC: Recent Labs  Lab 11/25/2020 0500  WBC 6.8  NEUTROABS 5.5  HGB 10.4*  HCT 33.1*  MCV 73.2*  PLT 225    Cardiac Enzymes: No results for input(s): CKTOTAL, CKMB, CKMBINDEX, TROPONINI in the last 168 hours.  BNP: BNP (last 3 results) Recent Labs    08/02/20 0832 10/22/20 1339 11/29/20 0913  BNP 1,285.0* 1,503.8* 1,429.6*    ProBNP (last 3 results) No results for input(s): PROBNP in the last 8760 hours.   CBG: No results for input(s): GLUCAP in the last 168 hours.  Coagulation Studies: Recent Labs    11/21/2020 0525  LABPROT 19.0*  INR 1.7*     Imaging   CT Head Wo Contrast  Result Date: 11/26/2020 CLINICAL DATA:  Fall, head injury EXAM: CT HEAD WITHOUT CONTRAST TECHNIQUE: Contiguous axial images were obtained from the base of the skull through the vertex without intravenous contrast. COMPARISON:  11/29/2020 FINDINGS: Brain: Normal anatomic configuration. Parenchymal volume loss is commensurate with the patient's age. Mild periventricular white matter changes are present likely reflecting the sequela of small vessel ischemia. No abnormal intra or extra-axial mass lesion or fluid collection. No abnormal mass effect or midline shift. No evidence of acute intracranial hemorrhage or infarct. Ventricular size is normal. Cerebellum unremarkable. Vascular: No asymmetric hyperdense vasculature at the skull base. Skull: Intact Sinuses/Orbits: Paranasal sinuses are clear. Orbits are unremarkable. Other: Mastoid air cells and middle ear cavities are clear. IMPRESSION: No acute intracranial abnormality. No calvarial fracture. Mild senescent change. Electronically Signed    By: Fidela Salisbury Vargas   On: 11/27/2020 04:09   Chest Portable 1 View  Result Date: 12/09/2020 CLINICAL DATA:  Fall this morning, hip fracture, initial encounter. EXAM: PORTABLE CHEST 1 VIEW COMPARISON:  11/29/2020. FINDINGS: Trachea is midline. Heart is enlarged, stable. Thoracic aorta is calcified. Right IJ central line tip is in the SVC. Pacemaker and ICD lead tips are stable in position. Lungs are clear.  No pleural fluid. IMPRESSION: 1. No acute findings. 2.  Aortic atherosclerosis (ICD10-I70.0). Electronically Signed   By: Lorin Picket M.D.   On: 11/18/2020 08:38   DG Hip Unilat With Pelvis 2-3 Views Right  Result Date: 11/23/2020 CLINICAL DATA:  Fall with hip pain EXAM: DG HIP (WITH OR WITHOUT PELVIS) 2-3V RIGHT COMPARISON:  08/13/2020 FINDINGS: Impacted subcapital right femoral neck fracture. Left hip arthroplasty which is intact where covered. No evidence of pelvic ring fracture or diastasis. IMPRESSION: Impacted, subcapital femoral neck fracture on the right. Electronically Signed   By: Monte Fantasia M.D.   On: 11/16/2020 04:18      Medications:     Current Medications: . amiodarone  200 mg Oral Daily  . [START ON 11/16/2020] levothyroxine  50 mcg Oral QAC breakfast  . magnesium oxide  200 mg Oral BID  . midodrine  10 mg Oral BID WC  . milrinone  50 mcg/kg Intravenous Once  . potassium chloride SA  40 mEq Oral BID  . rOPINIRole  0.5 mg Oral Q2000  . scopolamine  1 patch Transdermal Q72H  . sennosides-docusate sodium  1 tablet Oral QHS  . torsemide  60 mg Oral Daily  . zolpidem  10 mg Oral QHS     Infusions:    Assessment/Plan   1. Displaced right femoral head fracture: Patient is unable to walk.  At baseline, she is getting hospice care and is on high dose IV milrinone infusion for palliation.  She would be very high risk for surgery with end stage CHF and baseline frailty.  She is already under hospice care.  She will not walk again without surgery, but I suspect  the chances of her having surgery and then walking any time soon would be extremely small. I think that it would be reasonable to forego surgery and with hospice house [facility]) placement.  She will discuss with her sons.  - Consult palliative care.  2. Chronic Systolic Heart Failure: [place] 04/2016 showed no significant coronary disease. Cardiac MRI 10/17 EF 36%, diffuse HK, normal RV, biatrial enlargement, moderate MR, non-coronary LGE pattern involving the mid-wall of the basal to mid septum and inferior wall. Possibly prior myocarditis versus a form of infiltrative disease. Medtronic CRT-D s/p AV nodal ablation. Echo in 1/21 showed EF <20% with moderate LV dilation and mildly decreased RV systolic function. Echo in 12/21 with EF 20-25%, severe MR, severe TR, moderate AI.Low output on 1/21 RHC (CI 1.72) and 12/21 RHC (CI 1.8). End stage HF now with home milrinone.  Baseline NYHA class IIIb-IV symptoms, no change.  Volume status appears reasonably well-controlled.  - Continue home torsemide 60 mg daily.  - Continue palliative milrinone 0.5 mcg/kg/min.  - She is off losartan, bisoprololand spironolactone with symptomatic hypotension and now is on midodrine 10 mg bid. - Off digoxin with elevated level. 3. CKD stage 3: Creatinine 1.7, this is around her recent baseline.  4. Atrial fibrillation: Chronic, s/p AV nodal ablation and BiV pacing.  - Xarelto 15 mg daily held, ?surgery.  5. PVCs/VT: Frequent PVCs have limited BiV pacing. Not a good PVC ablation candidate per EP. Had VT in 1/21. Cannot tolerate sotalol orTikosyn. Did not tolerate mexilitine with nausea. Had nausea also with ranolazine. She had nausea in the past with amiodarone but seems to be tolerating it now. -Continue amiodarone 200 mg daily for PVC suppression to try to promote BiV pacing. 6. Mitral regurgitation:Severe on 12/21 echo, not candidate for intervention. 7. Hypothyroidism:On Levoxyl. Per PCP.  Length of  Stay: 0  [name], Vargas  11/20/2020, 12:38 PM  Advanced Heart Failure Team Pager [phone] (M-F; 7a - 5p)  Please contact [facility] for night-coverage after hours (4p -7a ) and weekends on [email or web address]

## 2020-12-06 NOTE — H&P (Signed)
History and Physical    Cheryl Vargas JXB:[id number] DOB: 06-11-1935 DOA: 11/13/2020  Referring MD/NP/PA: Inda Merlin, MD PCP: Lavone Orn, MD  Patient coming from: Home via EMS  Chief Complaint: Fall  I have personally briefly reviewed patient's old medical records in [place]   HPI: Cheryl Vargas is a 86 y.o. female with medical history significant of hypertension, hyperlipidemia, nonischemic cardiomyopathy, chronic systolic CHF with EF 13-08% on milrinone, permanent atrial fibrillation, severe mitral valve regurgitation, hypothyroidism, and CKD stage IIIb presents after having a fall at home.  She has been getting around with the use of a walker and has a Actuary.  Patient had just been hospitalized 2/11-2/19 for acute on chronic systolic congestive heart failure exacerbation.  She required being placed on milrinone and was treated with IV Lasix.  Cardiology had evaluated and evaluated for milrinone at home, but suggested if condition deteriorated patient should be on hospice.  She subsequently presented back to the hospital on 3/21, with complaints of shortness of breath and had been given 80 mg of Lasix IV, and switch from p.o. Lasix to torsemide.  Since that time she reported that the swelling has improved some and her weight is around 124 pounds.  She had been trying to get up from the potty chair this morning when her right leg slipped and she fell hitting her head on the carpet.  Denied any loss of consciousness and was able to be helped up by her sitter.  She complained of having significant pain in the right leg and had taken 2 Tylenol for symptoms at home.  She called hospice and they ultimately got her here to the hospital.  She had previously fractured the left hip 6 years ago and it was repaired by Dr. Alvan Dame.  ED Course: Upon admission to the emergency department patient was noted to be hemodynamically stable.  CT scan of the head without contrast showed no acute  abnormalities.  X-rays revealed impacted subcapital femoral neck fracture on the right.  Labs significant for hemoglobin 10.4, sodium 132, BUN 33, creatinine 1.7, and INR 1.7.  COVID-19 and influenza screening were both negative.  Orthopedics was formally consulted.  She has been given Dilaudid 0.5 mg IV x1 dose.  After receiving IV pain medication patient O2 saturations dropped temporarily into the 60s and she was placed on 2 L nasal cannula oxygen.  Review of Systems  Constitutional: Positive for malaise/fatigue. Negative for fever.  HENT: Negative for congestion and nosebleeds.   Eyes: Negative for photophobia and pain.  Respiratory: Negative for cough and shortness of breath.   Cardiovascular: Positive for leg swelling. Negative for chest pain.  Gastrointestinal: Negative for abdominal pain, nausea and vomiting.  Genitourinary: Negative for dysuria and hematuria.  Musculoskeletal: Positive for falls and joint pain.  Neurological: Positive for weakness. Negative for focal weakness and loss of consciousness.  Psychiatric/Behavioral: Negative for memory loss and substance abuse.    Past Medical History:  Diagnosis Date  . Arthritis   . Chronic combined systolic (congestive) and diastolic (congestive) heart failure ([place])   . Mitral regurgitation   . Non-ischemic cardiomyopathy ([place])   . Persistent atrial fibrillation ([place])   . Ventricular tachycardia [facility])     Past Surgical History:  Procedure Laterality Date  . AV NODE ABLATION N/A 09/07/2017   Procedure: AV NODE ABLATION;  Surgeon: [name], MD;  Location: [place] CV LAB;  Service: Cardiovascular;  Laterality: N/A;  . BIV ICD INSERTION CRT-D N/A  09/07/2017   Procedure: BIV ICD INSERTION CRT-D;  Surgeon: [name], MD;  Location: [place] CV LAB;  Service: Cardiovascular;  Laterality: N/A;  . BUNIONECTOMY    . CARDIAC CATHETERIZATION     in 2004 at [facility]. "Insignificant blockage" per patient  . CARDIAC  CATHETERIZATION N/A 04/25/2016   Procedure: Left Heart Cath and Coronary Angiography;  Surgeon: Nelva Bush, MD;  Location: [place] [facility] LAB;  Service: Cardiovascular;  Laterality: N/A;  . CARDIOVERSION N/A 06/29/2017   Procedure: CARDIOVERSION;  Surgeon: Larey Dresser, MD;  Location: [facility] ENDOSCOPY;  Service: Cardiovascular;  Laterality: N/A;  . CARDIOVERSION N/A 08/28/2017   Procedure: CARDIOVERSION;  Surgeon: Larey Dresser, MD;  Location: [place];  Service: Cardiovascular;  Laterality: N/A;  . IR ANGIOGRAM PULMONARY RIGHT SELECTIVE  08/04/2020  . IR ANGIOGRAM SELECTIVE EACH ADDITIONAL VESSEL  08/04/2020  . IR CT SPINE LTD  08/04/2020  . IR EMBO ART  VEN HEMORR LYMPH EXTRAV  INC GUIDE ROADMAPPING  08/04/2020  . IR FLUORO GUIDE CV LINE RIGHT  08/11/2020  . IR US GUIDE VASC ACCESS RIGHT  08/11/2020  . RADIOLOGY WITH ANESTHESIA N/A 08/04/2020   Procedure: IR WITH ANESTHESIA;  Surgeon: Arne Cleveland, MD;  Location: [place];  Service: Radiology;  Laterality: N/A;  . RIGHT HEART CATH N/A 09/29/2019   Procedure: RIGHT HEART CATH;  Surgeon: Larey Dresser, MD;  Location: [place] CV LAB;  Service: Cardiovascular;  Laterality: N/A;  . RIGHT HEART CATH N/A 08/04/2020   Procedure: RIGHT HEART CATH;  Surgeon: Larey Dresser, MD;  Location: [place] CV LAB;  Service: Cardiovascular;  Laterality: N/A;  . SHOULDER SURGERY     closed reduction  . TEE WITHOUT CARDIOVERSION N/A 06/29/2017   Procedure: TRANSESOPHAGEAL ECHOCARDIOGRAM (TEE);  Surgeon: Larey Dresser, MD;  Location: [facility]COPY;  Service: Cardiovascular;  Laterality: N/A;  . TONSILLECTOMY    . TOTAL HIP ARTHROPLASTY Left 03/18/2013   Procedure: TOTAL HIP ARTHROPLASTY ANTERIOR APPROACH;  Surgeon: Mauri Pole, MD;  Location: [facility];  Service: Orthopedics;  Laterality: Left;  . TUBAL LIGATION       reports that she has never smoked. She has never used smokeless tobacco. She reports current alcohol use of about 3.0  standard drinks of alcohol per week. She reports that she does not use drugs.  Allergies  Allergen Reactions  . Sotalol Other (See Comments)    Prolonged QTc  . Alendronate Sodium Other (See Comments)    Arm pain  . Compazine [Prochlorperazine] Other (See Comments)    Nervous reaction  . Dofetilide Nausea Only    Reported by [facility] physicians  . Lisinopril Other (See Comments)    Low bp  . Ranexa [Ranolazine] Nausea Only  . Septra [Sulfamethoxazole-Trimethoprim] Nausea Only  . Zolpidem Tartrate Er Other (See Comments)    Cannot take extended release - makes her too tired the next day  . Ciprofloxacin Other (See Comments)    Not effective  . Coreg [Carvedilol] Other (See Comments)    Fatigue   . Metoprolol Other (See Comments)    Profound fatigue    Family History  Problem Relation Age of Onset  . Lung cancer Mother   . Heart attack Father     Prior to Admission medications   Medication Sig Start Date End Date Taking? Authorizing Provider  acetaminophen (TYLENOL) 500 MG tablet Take 500-1,000 mg by mouth every 6 (six) hours as needed for headache (pain).    [provider]  amiodarone (PACERONE) 200 MG tablet Take 1 tablet (200 mg total) by mouth daily. 10/30/20   Regalado, Jerald Kief A, MD  Calcium Carbonate Antacid (TUMS ULTRA 1000 PO) Take 1,000 mg by mouth 2 (two) times daily as needed (heartburn/indigestion).    [provider]  fluticasone (FLONASE) 50 MCG/ACT nasal spray Place 1 spray into both nostrils daily as needed for allergies or rhinitis (seasonal allergies).    [provider]  levothyroxine (SYNTHROID) 50 MCG tablet Take 1 tablet (50 mcg total) by mouth daily before breakfast. 10/31/20   Regalado, Belkys A, MD  loperamide (IMODIUM A-D) 2 MG tablet Take 2 mg by mouth as needed for diarrhea or loose stools.    [provider]  LORazepam (ATIVAN) 0.5 MG tablet Take 1 tablet (0.5 mg total) by mouth every 8 (eight) hours as needed for  anxiety (anxiety,SOB). 10/30/20   Regalado, Belkys A, MD  magnesium oxide (MAG-OX) 400 MG tablet Take 0.5 tablets (200 mg total) by mouth 2 (two) times daily. 10/30/20   Regalado, Belkys A, MD  midodrine (PROAMATINE) 10 MG tablet Take 1 tablet (10 mg total) by mouth 2 (two) times daily with a meal. 10/30/20   Regalado, Belkys A, MD  pantoprazole (PROTONIX) 40 MG tablet Take 1 tablet (40 mg total) by mouth daily. 08/18/20   Clegg, [name] D, NP  polyethylene glycol (MIRALAX / GLYCOLAX) 17 g packet Take 17 g by mouth 2 (two) times daily. Patient not taking: No sig reported 10/30/20   Regalado, Belkys A, MD  potassium chloride SA (KLOR-CON) 20 MEQ tablet Take 2 tablets (40 mEq total) by mouth 2 (two) times daily. 11/09/20   Larey Dresser, MD  Rivaroxaban (XARELTO) 15 MG TABS tablet Take 1 tablet (15 mg total) by mouth daily with supper. 10/18/20   [place], Maricela Bo, FNP  rOPINIRole (REQUIP) 0.5 MG tablet Take 1 tablet (0.5 mg total) by mouth daily at 8 pm. 10/30/20   Regalado, Jerald Kief A, MD  sennosides-docusate sodium (SENOKOT-S) 8.6-50 MG tablet Take 1 tablet by mouth daily as needed for constipation.    [provider]  torsemide (DEMADEX) 20 MG tablet Take 3 tablets (60 mg total) by mouth daily. 11/29/20   Lucrezia Starch, MD  zolpidem (AMBIEN) 10 MG tablet Take 10 mg by mouth at bedtime. 11/12/20   [provider]  furosemide (LASIX) 20 MG tablet Take 3 tablets (60 mg total) by mouth 2 (two) times daily. 11/23/20 11/29/20  Larey Dresser, MD    Physical Exam:  Constitutional: Chronically ill-appearing elderly female Vitals:   12/04/2020 0328 11/26/2020 0333 12/08/2020 0645 12/03/2020 0700  BP: (!) 132/96   139/72  Pulse: 83   80  Resp: 18   17  Temp: 97.6 F (36.4 C)     TempSrc: Oral     SpO2: 91%  95%   Weight:  56.2 kg    Height:  5\' 5"  (1.651 m)     Eyes: PERRL, lids and conjunctivae normal ENMT: Mucous membranes are moist. Posterior pharynx clear of any exudate or lesions.  Neck:  normal, supple, no masses, no thyromegaly Respiratory: clear to auscultation bilaterally, no wheezing, no crackles. Normal respiratory effort. No accessory muscle use.  Cardiovascular: Regular rate and rhythm, no murmurs / rubs / gallops.  Trace lower extremity edema. 2+ pedal pulses. No carotid bruits.  Central catheter in place. Abdomen: no tenderness, no masses palpated. No hepatosplenomegaly. Bowel sounds positive.  Musculoskeletal: no clubbing / cyanosis. No  joint deformity upper and lower extremities. Good ROM, no contractures. Normal muscle tone.  Skin: no rashes, lesions, ulcers. No induration Neurologic: CN 2-12 grossly intact. Sensation intact, DTR normal. Strength 5/5 in all 4.  Psychiatric: Normal judgment and insight. Alert and oriented x 3. Normal mood.     Labs on Admission: I have personally reviewed following labs and imaging studies  CBC: Recent Labs  Lab 11/29/20 0913 11/15/2020 0500  WBC 4.4 6.8  NEUTROABS 3.0 5.5  HGB 10.0* 10.4*  HCT 31.6* 33.1*  MCV 75.1* 73.2*  PLT 220 563   Basic Metabolic Panel: Recent Labs  Lab 11/29/20 0913 11/26/2020 0500  NA 133* 132*  K 3.7 3.7  CL 99 97*  CO2 25 26  GLUCOSE 111* 126*  BUN 35* 33*  CREATININE 1.63* 1.70*  CALCIUM 8.8* 8.5*   GFR: Estimated Creatinine Clearance: 22.2 mL/min (A) (by C-G formula based on SCr of 1.7 mg/dL (H)). Liver Function Tests: Recent Labs  Lab 11/29/20 0913  AST 32  ALT 24  ALKPHOS 84  BILITOT 1.7*  PROT 7.5  ALBUMIN 3.3*   No results for input(s): LIPASE, AMYLASE in the last 168 hours. No results for input(s): AMMONIA in the last 168 hours. Coagulation Profile: Recent Labs  Lab 11/28/2020 0525  INR 1.7*   Cardiac Enzymes: No results for input(s): CKTOTAL, CKMB, CKMBINDEX, TROPONINI in the last 168 hours. BNP (last 3 results) No results for input(s): PROBNP in the last 8760 hours. HbA1C: No results for input(s): HGBA1C in the last 72 hours. CBG: No results for input(s):  GLUCAP in the last 168 hours. Lipid Profile: No results for input(s): CHOL, HDL, LDLCALC, TRIG, CHOLHDL, LDLDIRECT in the last 72 hours. Thyroid Function Tests: No results for input(s): TSH, T4TOTAL, FREET4, T3FREE, THYROIDAB in the last 72 hours. Anemia Panel: No results for input(s): VITAMINB12, FOLATE, FERRITIN, TIBC, IRON, RETICCTPCT in the last 72 hours. Urine analysis:    Component Value Date/Time   COLORURINE YELLOW 10/23/2020 0946   APPEARANCEUR CLEAR 10/23/2020 0946   LABSPEC >1.030 (H) 10/23/2020 0946   PHURINE 5.5 10/23/2020 0946   GLUCOSEU NEGATIVE 10/23/2020 0946   HGBUR TRACE (A) 10/23/2020 0946   BILIRUBINUR NEGATIVE 10/23/2020 0946   KETONESUR NEGATIVE 10/23/2020 0946   PROTEINUR 100 (A) 10/23/2020 0946   UROBILINOGEN 0.2 03/17/2013 1423   NITRITE NEGATIVE 10/23/2020 0946   LEUKOCYTESUR SMALL (A) 10/23/2020 0946   Sepsis Labs: Recent Results (from the past 240 hour(s))  Resp Panel by RT-PCR (Flu A&B, Covid) Nasopharyngeal Swab     Status: None   Collection Time: 11/29/20  9:30 AM   Specimen: Nasopharyngeal Swab; Nasopharyngeal(NP) swabs in vial transport medium  Result Value Ref Range Status   SARS Coronavirus 2 by RT PCR NEGATIVE NEGATIVE Final    Comment: (NOTE) SARS-CoV-2 target nucleic acids are NOT DETECTED.  The SARS-CoV-2 RNA is generally detectable in upper respiratory specimens during the acute phase of infection. The lowest concentration of SARS-CoV-2 viral copies this assay can detect is 138 copies/mL. A negative result does not preclude SARS-Cov-2 infection and should not be used as the sole basis for treatment or other patient management decisions. A negative result may occur with  improper specimen collection/handling, submission of specimen other than nasopharyngeal swab, presence of viral mutation(s) within the areas targeted by this assay, and inadequate number of viral copies(<138 copies/mL). A negative result must be combined with clinical  observations, patient history, and epidemiological information. The expected result is Negative.  Fact Sheet  for Patients:  EntrepreneurPulse.com.au  Fact Sheet for Healthcare Providers:  IncredibleEmployment.be  This test is no t yet approved or cleared by the [place] FDA and  has been authorized for detection and/or diagnosis of SARS-CoV-2 by FDA under an Emergency Use Authorization (EUA). This EUA will remain  in effect (meaning this test can be used) for the duration of the COVID-19 declaration under Section 564(b)(1) of the Act, 21 U.S.C.section 360bbb-3(b)(1), unless the authorization is terminated  or revoked sooner.       Influenza A by PCR NEGATIVE NEGATIVE Final   Influenza B by PCR NEGATIVE NEGATIVE Final    Comment: (NOTE) The Xpert Xpress SARS-CoV-2/FLU/RSV plus assay is intended as an aid in the diagnosis of influenza from Nasopharyngeal swab specimens and should not be used as a sole basis for treatment. Nasal washings and aspirates are unacceptable for Xpert Xpress SARS-CoV-2/FLU/RSV testing.  Fact Sheet for Patients: EntrepreneurPulse.com.au  Fact Sheet for Healthcare Providers: IncredibleEmployment.be  This test is not yet approved or cleared by the [place] FDA and has been authorized for detection and/or diagnosis of SARS-CoV-2 by FDA under an Emergency Use Authorization (EUA). This EUA will remain in effect (meaning this test can be used) for the duration of the COVID-19 declaration under Section 564(b)(1) of the Act, 21 U.S.C. section 360bbb-3(b)(1), unless the authorization is terminated or revoked.  Performed at [facility], [place] [address], [place], [place] [address]   Resp Panel by RT-PCR (Flu A&B, Covid) Nasopharyngeal Swab     Status: None   Collection Time: 12/02/2020  5:00 AM   Specimen: Nasopharyngeal Swab; Nasopharyngeal(NP) swabs in vial transport medium   Result Value Ref Range Status   SARS Coronavirus 2 by RT PCR NEGATIVE NEGATIVE Final    Comment: (NOTE) SARS-CoV-2 target nucleic acids are NOT DETECTED.  The SARS-CoV-2 RNA is generally detectable in upper respiratory specimens during the acute phase of infection. The lowest concentration of SARS-CoV-2 viral copies this assay can detect is 138 copies/mL. A negative result does not preclude SARS-Cov-2 infection and should not be used as the sole basis for treatment or other patient management decisions. A negative result may occur with  improper specimen collection/handling, submission of specimen other than nasopharyngeal swab, presence of viral mutation(s) within the areas targeted by this assay, and inadequate number of viral copies(<138 copies/mL). A negative result must be combined with clinical observations, patient history, and epidemiological information. The expected result is Negative.  Fact Sheet for Patients:  EntrepreneurPulse.com.au  Fact Sheet for Healthcare Providers:  IncredibleEmployment.be  This test is no t yet approved or cleared by the [place] FDA and  has been authorized for detection and/or diagnosis of SARS-CoV-2 by FDA under an Emergency Use Authorization (EUA). This EUA will remain  in effect (meaning this test can be used) for the duration of the COVID-19 declaration under Section 564(b)(1) of the Act, 21 U.S.C.section 360bbb-3(b)(1), unless the authorization is terminated  or revoked sooner.       Influenza A by PCR NEGATIVE NEGATIVE Final   Influenza B by PCR NEGATIVE NEGATIVE Final    Comment: (NOTE) The Xpert Xpress SARS-CoV-2/FLU/RSV plus assay is intended as an aid in the diagnosis of influenza from Nasopharyngeal swab specimens and should not be used as a sole basis for treatment. Nasal washings and aspirates are unacceptable for Xpert Xpress SARS-CoV-2/FLU/RSV testing.  Fact Sheet for  Patients: EntrepreneurPulse.com.au  Fact Sheet for Healthcare Providers: IncredibleEmployment.be  This test is not yet approved  or cleared by the [place] and has been authorized for detection and/or diagnosis of SARS-CoV-2 by FDA under an Emergency Use Authorization (EUA). This EUA will remain in effect (meaning this test can be used) for the duration of the COVID-19 declaration under Section 564(b)(1) of the Act, 21 U.S.C. section 360bbb-3(b)(1), unless the authorization is terminated or revoked.  Performed at [facility], [place] [address] [place], [place] [address]      Radiological Exams on Admission: CT Head Wo Contrast  Result Date: 11/18/2020 CLINICAL DATA:  Fall, head injury EXAM: CT HEAD WITHOUT CONTRAST TECHNIQUE: Contiguous axial images were obtained from the base of the skull through the vertex without intravenous contrast. COMPARISON:  11/29/2020 FINDINGS: Brain: Normal anatomic configuration. Parenchymal volume loss is commensurate with the patient's age. Mild periventricular white matter changes are present likely reflecting the sequela of small vessel ischemia. No abnormal intra or extra-axial mass lesion or fluid collection. No abnormal mass effect or midline shift. No evidence of acute intracranial hemorrhage or infarct. Ventricular size is normal. Cerebellum unremarkable. Vascular: No asymmetric hyperdense vasculature at the skull base. Skull: Intact Sinuses/Orbits: Paranasal sinuses are clear. Orbits are unremarkable. Other: Mastoid air cells and middle ear cavities are clear. IMPRESSION: No acute intracranial abnormality. No calvarial fracture. Mild senescent change. Electronically Signed   By: Fidela Salisbury MD   On: 12/08/2020 04:09   DG Hip Unilat With Pelvis 2-3 Views Right  Result Date: 11/19/2020 CLINICAL DATA:  Fall with hip pain EXAM: DG HIP (WITH OR WITHOUT PELVIS) 2-3V RIGHT COMPARISON:  08/13/2020 FINDINGS:  Impacted subcapital right femoral neck fracture. Left hip arthroplasty which is intact where covered. No evidence of pelvic ring fracture or diastasis. IMPRESSION: Impacted, subcapital femoral neck fracture on the right. Electronically Signed   By: Monte Fantasia M.D.   On: 12/09/2020 04:18    EKG: Independently reviewed.   Ventricularly paced rhythm at 80 bpm  Assessment/Plan Closed fracture of right femoral neck secondary to fall: Acute.  Patient presents after having a fall found to have a impacted subcapital femoral neck fracture on the right.  At baseline patient with debility and frequent falls.  Geriatric sensitive perioperative cardiac risk index equals 5.3%.  Given patient's overall poor prognosis currently being on hospice with end-stage heart failure unclear if undergoing surgical procedure would be of any significant benefit.  Patient request that Dr. Aundra Dubin be consulted. -Admit to a medical telemetry bed -Hip fracture order set utilized -Continuous pulse oximetry with nasal cannula oxygen as needed -Heart healthy diet -Hydrocodone/morphine IV as needed for moderate to severe pain -Appreciate orthopedic consultative services, will follow-up for further recommendations -Cardiology consulted for recommendations on whether patient has appropriate surgical candidate   Acute kidney injury superimposed on chronic kidney disease stage IIIb: Patient baseline creatinine previously have been around 1.27 prior to discharge on 2/19.  Patient presents with creatinine elevated up to 1.7 with BUN 33. -Continue to monitor kidney function with diuresis  Systolic congestive heart failure Severe MR/TR and moderate AI: Patient's last echocardiogram from 07/2020 revealed EF of 20 to 25%.  Not a surgical candidate for known history of severe MR, severe TR, and moderate AI.  On physical exam patient with trace lower extremity edema.  Had just recently been seen in the emergency department last week and  evaluated by Dr. Kirk Ruths who changed her to torsemide  -Strict I&O's and daily weights -Check BNP -Check chest x-ray -Continue current medication regimen midodrine, milrinone, and torsemide  Microcytic hypochromic anemia: Hemoglobin 10.4 low MCV and MCH, but appears near patient's baseline. -Continue to monitor  Hyponatremia: Acute on chronic.  Sodium 132 on admission. -Continue to monitor sodium levels  Permanent atrial fibrillation on chronic anticoagulation: Patient is on Xarelto and amiodarone.  Status post AV nodal ablation and biventricular pacing. -Order placed to interrogate pacemaker -Held Xarelto -Continue amiodarone   Hypothyroidism TSH was 10.623 on 10/26/2020. -Add on TSH -Continue levothyroxine  Anxiety -Continue Ativan as needed  DNR on hospice: Present on admission. -Palliative care consulted for symptom management    DVT prophylaxis: SCDs Code Status: DNR Family Communication: Friend updated at bedside Disposition Plan: Likely need of rehab Consults called: Orthopedics Admission status: Inpatient require more than 2 midnight stay for need of surgical procedure  Norval Morton MD [facility]   If 7PM-7AM, please contact night-coverage   11/09/2020, 7:59 AM

## 2020-12-07 ENCOUNTER — Encounter (HOSPITAL_COMMUNITY): Admission: EM | Disposition: E | Payer: Self-pay | Source: Home / Self Care | Attending: Internal Medicine

## 2020-12-07 DIAGNOSIS — W19XXXA Unspecified fall, initial encounter: Secondary | ICD-10-CM | POA: Diagnosis not present

## 2020-12-07 DIAGNOSIS — S72001A Fracture of unspecified part of neck of right femur, initial encounter for closed fracture: Secondary | ICD-10-CM

## 2020-12-07 DIAGNOSIS — I5043 Acute on chronic combined systolic (congestive) and diastolic (congestive) heart failure: Secondary | ICD-10-CM | POA: Diagnosis not present

## 2020-12-07 DIAGNOSIS — I5022 Chronic systolic (congestive) heart failure: Secondary | ICD-10-CM | POA: Diagnosis not present

## 2020-12-07 DIAGNOSIS — N179 Acute kidney failure, unspecified: Secondary | ICD-10-CM | POA: Diagnosis not present

## 2020-12-07 DIAGNOSIS — Z7189 Other specified counseling: Secondary | ICD-10-CM

## 2020-12-07 DIAGNOSIS — Z515 Encounter for palliative care: Secondary | ICD-10-CM

## 2020-12-07 DIAGNOSIS — I4821 Permanent atrial fibrillation: Secondary | ICD-10-CM

## 2020-12-07 LAB — CBC
HCT: 31.3 % — ABNORMAL LOW (ref 36.0–46.0)
Hemoglobin: 9.5 g/dL — ABNORMAL LOW (ref 12.0–15.0)
MCH: 22.4 pg — ABNORMAL LOW (ref 26.0–34.0)
MCHC: 30.4 g/dL (ref 30.0–36.0)
MCV: 73.6 fL — ABNORMAL LOW (ref 80.0–100.0)
Platelets: 207 10*3/uL (ref 150–400)
RBC: 4.25 MIL/uL (ref 3.87–5.11)
RDW: 18 % — ABNORMAL HIGH (ref 11.5–15.5)
WBC: 5.4 10*3/uL (ref 4.0–10.5)
nRBC: 0 % (ref 0.0–0.2)

## 2020-12-07 LAB — BASIC METABOLIC PANEL
Anion gap: 7 (ref 5–15)
BUN: 31 mg/dL — ABNORMAL HIGH (ref 8–23)
CO2: 31 mmol/L (ref 22–32)
Calcium: 8.6 mg/dL — ABNORMAL LOW (ref 8.9–10.3)
Chloride: 98 mmol/L (ref 98–111)
Creatinine, Ser: 1.67 mg/dL — ABNORMAL HIGH (ref 0.44–1.00)
GFR, Estimated: 30 mL/min — ABNORMAL LOW (ref >60)
Glucose, Bld: 112 mg/dL — ABNORMAL HIGH (ref 70–99)
Potassium: 3.8 mmol/L (ref 3.5–5.1)
Sodium: 136 mmol/L (ref 135–145)

## 2020-12-07 LAB — COOXEMETRY PANEL
Carboxyhemoglobin: 1.3 % (ref 0.5–1.5)
Carboxyhemoglobin: 1.5 % (ref 0.5–1.5)
Methemoglobin: 0.8 % (ref 0.0–1.5)
Methemoglobin: 0.9 % (ref 0.0–1.5)
O2 Saturation: 42.4 %
O2 Saturation: 50.3 %
Total hemoglobin: 10.2 g/dL — ABNORMAL LOW (ref 12.0–16.0)
Total hemoglobin: 9.8 g/dL — ABNORMAL LOW (ref 12.0–16.0)

## 2020-12-07 SURGERY — ARTHROPLASTY, HIP, TOTAL, ANTERIOR APPROACH
Anesthesia: General | Site: Hip | Laterality: Right

## 2020-12-07 MED ORDER — CHLORHEXIDINE GLUCONATE CLOTH 2 % EX PADS
6.0000 | MEDICATED_PAD | Freq: Every day | CUTANEOUS | Status: DC
  Administered 2020-12-07 – 2020-12-10 (×4): 6 via TOPICAL

## 2020-12-07 MED ORDER — MORPHINE SULFATE (CONCENTRATE) 10 MG/0.5ML PO SOLN: 5.0000 mg | ORAL | Status: DC | PRN

## 2020-12-07 MED ORDER — PHENOL 1.4 % MT LIQD
1.0000 | OROMUCOSAL | Status: DC | PRN
  Administered 2020-12-07 – 2020-12-10 (×3): 1 via OROMUCOSAL
  Filled 2020-12-07: qty 177

## 2020-12-07 MED ORDER — FUROSEMIDE 10 MG/ML IJ SOLN
80.0000 mg | Freq: Once | INTRAMUSCULAR | Status: AC
  Administered 2020-12-07: 80 mg via INTRAVENOUS
  Filled 2020-12-07: qty 8

## 2020-12-07 NOTE — Plan of Care (Signed)

## 2020-12-07 NOTE — Progress Notes (Signed)
PMT consult received and chart reviewed. Met with patient this AM. Met with patient and daughter, [name] this afternoon. Reviewed hospice philosophy and options (hospice facility with discontinuation of milrinone vs. Home hospice with milrinone vs. Nursing facility with hospice and milrinone). [name] shares that the family is not ready to make decision today. They wish to focus on her comfort and symptom management. PMT contact information given and encouraged family to call with questions or concerns.   Full palliative note to follow.  NO CHARGE  Ihor Dow, [place], FNP-C Palliative Medicine Team  Phone: [phone] Fax: [phone]

## 2020-12-07 NOTE — Progress Notes (Addendum)
Advanced Heart Failure Rounding Note  PCP-Cardiologist: [name], MD   Subjective:    No events overnight. VSS. Co-ox low at 42% but no other labs drawn this am. Awaiting palliative care consult.    She denies any current hip pain. No dyspnea. Only complaint this morning is dry mouth. She has not decided on [facility] yet. Waiting for son to arrive from [place] today to further discuss.    Objective:   Weight Range: 59.2 kg Body mass index is 21.72 kg/m.   Vital Signs:   Temp:  [97.4 F (36.3 C)-97.9 F (36.6 C)] 97.6 F (36.4 C) (03/29 0315) Pulse Rate:  [79-81] 79 (03/29 0315) Resp:  [14-19] 18 (03/29 0315) BP: (92-133)/(41-71) 119/70 (03/29 0315) SpO2:  [91 %-97 %] 95 % (03/29 0315) Weight:  [59.2 kg] 59.2 kg (03/29 0319) Last BM Date: 12/05/20  Weight change: Filed Weights   11/21/2020 0333 12/06/2020 0319  Weight: 56.2 kg 59.2 kg    Intake/Output:   Intake/Output Summary (Last 24 hours) at 11/19/2020 0737 Last data filed at 12/06/2020 0600 Gross per 24 hour  Intake 572.24 ml  Output 375 ml  Net 197.24 ml      Physical Exam    General:  Thin/frail elderly WF . No resp difficulty HEENT: Normal Neck: Supple. JVP elevated to jaw . Carotids 2+ bilat; no bruits. No lymphadenopathy or thyromegaly appreciated. Cor: PMI nondisplaced. Regular rate & rhythm. No rubs, gallops or murmurs. Lungs: Clear Abdomen: Soft, nontender, nondistended. No hepatosplenomegaly. No bruits or masses. Good bowel sounds. Extremities: No cyanosis, clubbing, rash, edema Neuro: Alert & orientedx3, cranial nerves grossly intact. moves all 4 extremities w/o difficulty. Affect pleasant   Telemetry   V- paced 80s   EKG    No new EKG to review   Labs    CBC Recent Labs    12/02/2020 0500  WBC 6.8  NEUTROABS 5.5  HGB 10.4*  HCT 33.1*  MCV 73.2*  PLT 856   Basic Metabolic Panel Recent Labs    11/21/2020 0500  NA 132*  K 3.7  CL 97*  CO2 26  GLUCOSE 126*  BUN  33*  CREATININE 1.70*  CALCIUM 8.5*   Liver Function Tests No results for input(s): AST, ALT, ALKPHOS, BILITOT, PROT, ALBUMIN in the last 72 hours. No results for input(s): LIPASE, AMYLASE in the last 72 hours. Cardiac Enzymes No results for input(s): CKTOTAL, CKMB, CKMBINDEX, TROPONINI in the last 72 hours.  BNP: BNP (last 3 results) Recent Labs    10/22/20 1339 11/29/20 0913 12/02/2020 1021  BNP 1,503.8* 1,429.6* 1,866.0*    ProBNP (last 3 results) No results for input(s): PROBNP in the last 8760 hours.   D-Dimer No results for input(s): DDIMER in the last 72 hours. Hemoglobin A1C No results for input(s): HGBA1C in the last 72 hours. Fasting Lipid Panel No results for input(s): CHOL, HDL, LDLCALC, TRIG, CHOLHDL, LDLDIRECT in the last 72 hours. Thyroid Function Tests Recent Labs    11/14/2020 0500  TSH 14.648*    Other results:   Imaging    Chest Portable 1 View  Result Date: 12/08/2020 CLINICAL DATA:  Fall this morning, hip fracture, initial encounter. EXAM: PORTABLE CHEST 1 VIEW COMPARISON:  11/29/2020. FINDINGS: Trachea is midline. Heart is enlarged, stable. Thoracic aorta is calcified. Right IJ central line tip is in the SVC. Pacemaker and ICD lead tips are stable in position. Lungs are clear.  No pleural fluid. IMPRESSION: 1. No acute findings. 2.  Aortic atherosclerosis (ICD10-I70.0). Electronically Signed   By: Lorin Picket M.D.   On: 12/03/2020 08:38      Medications:     Scheduled Medications: . amiodarone  200 mg Oral Daily  . levothyroxine  50 mcg Oral Q0600  . magnesium oxide  200 mg Oral BID  . midodrine  10 mg Oral BID WC  . potassium chloride SA  40 mEq Oral BID  . rOPINIRole  0.5 mg Oral Q2000  . scopolamine  1 patch Transdermal Q72H  . senna-docusate  1 tablet Oral QHS  . torsemide  60 mg Oral Daily  . zolpidem  5 mg Oral QHS     Infusions: . milrinone 0.5 mcg/kg/min (11/14/2020 0317)     PRN Medications:  fluticasone,  HYDROcodone-acetaminophen, loperamide, LORazepam, morphine injection, ondansetron, phenol, simethicone    Assessment/Plan   1. Displaced right femoral head fracture: Patient is unable to walk.  At baseline, she is getting hospice care and is on high dose IV milrinone infusion for palliation.  She would be very high risk for surgery with end stage CHF and baseline frailty.  She is already under hospice care.  She will not walk again without surgery, but I suspect the chances of her having surgery and then walking any time soon would be extremely small. I think that it would be reasonable to forego surgery and with hospice house [facility]) placement.  She will discuss with her sons.  - Consult palliative care.  2. Chronic Systolic Heart Failure:NICM,LHC 04/2016 showed no significant coronary disease. Cardiac MRI 10/17 EF 36%, diffuse HK, normal RV, biatrial enlargement, moderate MR, non-coronary LGE pattern involving the mid-wall of the basal to mid septum and inferior wall. Possibly prior myocarditis versus a form of infiltrative disease. [employer] CRT-D s/p AV nodal ablation. Echo in 1/21 showed EF <20% with moderate LV dilation and mildly decreased RV systolic function. Echo in 12/21 with EF 20-25%, severe MR, severe TR, moderate AI.Low output on 1/21 RHC (CI 1.72) and 12/21 RHC (CI 1.8). End stage HF now with home milrinone.  Baseline NYHA class IIIb-IV symptoms, no change.  Volume status appears reasonably well-controlled.  - Continue home torsemide 60 mg daily. Low threshold to give IV Lasix  - Continue palliative milrinone 0.5 mcg/kg/min. Co-ox low this am at 42%. Will repeat  -She is off losartan, bisoprololand spironolactone with symptomatic hypotension and now is on midodrine 10 mg bid. - Off digoxin with elevated level. 3. CKD stage 3: Creatinine 1.7 on admit, this is around her recent baseline. F/u BMP pending  4.Atrial fibrillation: Chronic, s/p AV nodal ablation and BiV  pacing.  - Xarelto 15 mg daily held, ?surgery.  5. PVCs/VT: Frequent PVCs have limited BiV pacing. Not a good PVC ablation candidate per EP. Had VT in 1/21. Cannot tolerate sotalol orTikosyn. Did not tolerate mexilitine with nausea. Had nausea also with ranolazine. She had nausea in the past with amiodarone but seems to be tolerating it now. -Continue amiodarone 200 mg daily for PVC suppression to try to promote BiV pacing. 6. Mitral regurgitation:Severe on 12/21 echo, not candidate for intervention. 7. Hypothyroidism:On Levoxyl. Per PCP.  Length of Stay: 1  Lyda Jester, PA-C  11/25/2020, 7:37 AM  Advanced Heart Failure Team Pager [phone] (M-F; 7a - 5p)  Please contact [facility] for night-coverage after hours (5p -7a ) and weekends on [email or web address]  Patient seen with PA, agree with the above note.   Right hip area is sore.  Some dyspnea overnight.  General: NAD Neck: JVP 12 cm, no thyromegaly or thyroid nodule.  Lungs: Clear to auscultation bilaterally with normal respiratory effort. CV: Nondisplaced PMI.  Heart regular S1/S2, no S3/S4, no murmur.  No peripheral edema.   Abdomen: Soft, nontender, no hepatosplenomegaly, no distention.  Skin: Intact without lesions or rashes.  Neurologic: Alert and oriented x 3.  Psych: Normal affect. Extremities: No clubbing or cyanosis.  HEENT: Normal.   Mild volume overload on exam.  She had po torsemide this morning, will give a dose of IV Lasix 80 mg this afternoon and follow response.    With end stage CHF on high dose milrinone 0.5 and marked frailty, I think she would be at extremely high risk of peri-operative complications if hip surgery attempted.  She is going to talk over options with her sons.  Alternatives would be [facility] off milrinone or hospice at SNF on milrinone.  Need palliative care service input.   [name] 11/11/2020 9:16 AM

## 2020-12-07 NOTE — Consult Note (Signed)
Consultation Note Date: 11/15/2020   Patient Name: Cheryl Vargas  DOB: Oct 29, 1934  MRN: [id number]  Age / Sex: 86 y.o., female  PCP: Lavone Orn, MD Referring Physician: Dessa Phi, DO  Reason for Consultation: Establishing goals of care  HPI/Patient Profile per attending: ARISTA Vargas is an 86 y.o.femalewith medical history significant ofhypertension, hyperlipidemia, nonischemic cardiomyopathy, chronic systolic CHF with EF 88-75%ZV milrinone, permanent atrial fibrillation, severe mitral valve regurgitation, hypothyroidism,and CKD stage IIIb presents after having a fall at home.She has been getting around with the use of a walker and has a Actuary.  Patient had just been hospitalized 2/11-2/19 for acute on chronic systolic congestive heart failure exacerbation. She required being placed on milrinone and was treated with IV Lasix. Cardiology had evaluated and evaluated for milrinone at home, but suggested if condition deteriorated patient should be on hospice. She subsequently presented back to the hospital on 3/21, with complaints of shortness of breath and had been given 80 mg of Lasix IV, and switch from p.o. Lasix to torsemide.Since that time she reported that the swelling has improved some and her weight is around 124 pounds. She had been trying to get up from the potty chair this morning when her right leg slipped and she fell hitting her head on the carpet. Denied any loss of consciousness and was able to be helped up by her sitter. She complained of having significant pain in the right leg and had taken 2 Tylenol for symptoms at home. She called hospice and they ultimately got her here to the hospital.   CT scan of the head without contrast showed no acute abnormalities. X-rays revealed impacted subcapital femoral neck fracture on the right. Dr. Aundra Dubin following. She would be very  high risk for surgery with end stage CHF and baseline frailty.She is already under hospice care. She will not walk again without surgery, but I suspect the chances of her having surgery and then walking any time soon would be extremely small. I think that it would be reasonable to forego surgery and with hospice house [facility]) placement vs SNF with hospice. Palliative medicine consultation for goals of care/hospice discussion.   Clinical Assessment and Goals of Care: PMT consult received and chart reviewed. Discussed with RN. This AM, met with patient at bedside. Cheryl Vargas is awake, alert, oriented and able to participate in discussion.   Prior to hospitalization, living home with intermittent caregiver support. She was recently initiated on home hospice services through [facility] and continuous milrinone.  Discussed events leading up to admission and course of hospitalization including diagnoses, interventions, plan of care. Patient has a good understand of her condition and the decision against surgical intervention for hip due to end-stage heart failure.  Discussed hospice philosophy and options in detail--> Hospice facility but with discontinuation of milrinone versus home with hospice or nursing facility with hospice if she wishes to continue milrinone infusion. Patient shares that her sons will be at [facility] this afternoon and requests palliative provider follow-up this afternoon to further  discuss with her family. She does mention her desire to continue milrinone infusion.   Answered questions. Support provided. PMT contact information given.  **This afternoon, met with patient and daughter, Cheryl Vargas at bedside. Patient is drowsy this afternoon. She was recently given IV morphine which provides relief of her hip pain.   Introduced role of palliative medicine. Cheryl Vargas shares understanding of plan of care and recommendation for ongoing hospice on discharge. Discussed hospice options again including  [facility] (with understanding milrinone would be discontinued and symptoms management with other medications) versus home with hospice and milrinone versus nursing facility with hospice and milrinone if that remains important to patient and family to continue. Cheryl Vargas shares that the family is not ready to make decision today. They wish to focus on her comfort and symptom management for today. Answered questions. Encouraged Cheryl Vargas to call PMT provider with questions or concerns.    SUMMARY OF RECOMMENDATIONS    Continue current plan of care and medical management.  Patient/family considering hospice options. They are not ready to make decision today and prefer to focus on her comfort and symptom management. PMT contact information given and encouraged daughter to call PMT provider with questions or concerns.   Code Status/Advance Care Planning:  DNR   Completed MOST form in ACP tab  Symptom Management:   Hydrocodone 5-325, 1 tab q6h prn moderate pain  Morphine 0.73m IV q2h prn severe pain/dyspnea  Roxanol 560mSL q2h prn moderate pain/dyspnea  Senna scheduled daily  Palliative Prophylaxis:   Aspiration, Bowel Regimen, Delirium Protocol, Frequent Pain Assessment, Oral Care and Turn Reposition  Psycho-social/Spiritual:   Desire for further Chaplaincy support: yes  Additional Recommendations: Caregiving  Support/Resources, Compassionate Wean Education and Education on Hospice  Prognosis:   Poor long-term  Discharge Planning: To Be Determined: Patient/family considering hospice options.       Primary Diagnoses: Present on Admission: . Fracture of femoral neck, right, closed ([facility]. Acute kidney injury superimposed on CKD ([facility]. Permanent atrial fibrillation (H[facility]  I have reviewed the medical record, interviewed the patient and family, and examined the patient. The following aspects are pertinent.  Past Medical History:  Diagnosis Date  . Arthritis   . Chronic combined  systolic (congestive) and diastolic (congestive) heart failure (HCR[place]  . Mitral regurgitation   . Non-ischemic cardiomyopathy ([facility]inn[facility]  . Persistent atrial fibrillation (HCC[facility]  . Ventricular tachycardia (HCC)    Social History   Socioeconomic History  . Marital status: Widowed    Spouse name: Not on file  . Number of children: Not on file  . Years of education: Not on file  . Highest education level: Not on file  Occupational History  . Not on file  Tobacco Use  . Smoking status: Never Smoker  . Smokeless tobacco: Never Used  Vaping Use  . Vaping Use: Never used  Substance and Sexual Activity  . Alcohol use: Yes    Alcohol/week: 3.0 standard drinks    Types: 3 Glasses of wine per week    Comment: per week  . Drug use: No  . Sexual activity: Not on file  Other Topics Concern  . Not on file  Social History Narrative  . Not on file   Social Determinants of Health   Financial Resource Strain: Not on file  Food Insecurity: Not on file  Transportation Needs: Not on file  Physical Activity: Not on file  Stress: Not on file  Social Connections: Not on file  Family History  Problem Relation Age of Onset  . Lung cancer Mother   . Heart attack Father    Scheduled Meds: . amiodarone  200 mg Oral Daily  . furosemide  80 mg Intravenous Once  . levothyroxine  50 mcg Oral Q0600  . magnesium oxide  200 mg Oral BID  . midodrine  10 mg Oral BID WC  . potassium chloride SA  40 mEq Oral BID  . rOPINIRole  0.5 mg Oral Q2000  . scopolamine  1 patch Transdermal Q72H  . senna-docusate  1 tablet Oral QHS  . torsemide  60 mg Oral Daily  . zolpidem  5 mg Oral QHS   Continuous Infusions: . milrinone 0.5 mcg/kg/min (12/09/2020 0317)   PRN Meds:.fluticasone, HYDROcodone-acetaminophen, loperamide, LORazepam, morphine injection, ondansetron, phenol, simethicone Medications Prior to Admission:  Prior to Admission medications   Medication Sig Start Date End Date Taking? Authorizing  Provider  acetaminophen (TYLENOL) 500 MG tablet Take 500-1,000 mg by mouth every 6 (six) hours as needed for headache (pain).   Yes [provider]  amiodarone (PACERONE) 200 MG tablet Take 1 tablet (200 mg total) by mouth daily. 10/30/20  Yes Regalado, Belkys A, MD  Calcium Carbonate Antacid (TUMS ULTRA 1000 PO) Take 1,000 mg by mouth 2 (two) times daily as needed (heartburn/indigestion).   Yes [provider]  fluticasone (FLONASE) 50 MCG/ACT nasal spray Place 1 spray into both nostrils daily as needed for allergies or rhinitis (seasonal allergies).   Yes [provider]  levothyroxine (SYNTHROID) 50 MCG tablet Take 1 tablet (50 mcg total) by mouth daily before breakfast. 10/31/20  Yes Regalado, Belkys A, MD  loperamide (IMODIUM A-D) 2 MG tablet Take 2 mg by mouth as needed for diarrhea or loose stools.   Yes [provider]  LORazepam (ATIVAN) 0.5 MG tablet Take 1 tablet (0.5 mg total) by mouth every 8 (eight) hours as needed for anxiety (anxiety,SOB). 10/30/20  Yes Regalado, Belkys A, MD  magnesium oxide (MAG-OX) 400 MG tablet Take 0.5 tablets (200 mg total) by mouth 2 (two) times daily. 10/30/20  Yes Regalado, Belkys A, MD  midodrine (PROAMATINE) 10 MG tablet Take 1 tablet (10 mg total) by mouth 2 (two) times daily with a meal. 10/30/20  Yes Regalado, Belkys A, MD  ondansetron (ZOFRAN) 4 MG tablet Take 4 mg by mouth every 12 (twelve) hours as needed for nausea or vomiting.   Yes [provider]  pantoprazole (PROTONIX) 40 MG tablet Take 1 tablet (40 mg total) by mouth daily. 08/18/20  Yes Clegg, Cheryl Vargas D, NP  potassium chloride SA (KLOR-CON) 20 MEQ tablet Take 2 tablets (40 mEq total) by mouth 2 (two) times daily. 11/09/20  Yes Larey Dresser, MD  Rivaroxaban (XARELTO) 15 MG TABS tablet Take 1 tablet (15 mg total) by mouth daily with supper. 10/18/20  Yes Milford, Maricela Bo, FNP  rOPINIRole (REQUIP) 0.5 MG tablet Take 1 tablet (0.5 mg total) by mouth daily at 8  pm. 10/30/20  Yes Regalado, Belkys A, MD  sennosides-docusate sodium (SENOKOT-S) 8.6-50 MG tablet Take 1 tablet by mouth at bedtime.   Yes [provider]  torsemide (DEMADEX) 20 MG tablet Take 3 tablets (60 mg total) by mouth daily. 11/29/20  Yes Lucrezia Starch, MD  zolpidem (AMBIEN) 10 MG tablet Take 10 mg by mouth at bedtime. 11/12/20  Yes [provider]  polyethylene glycol (MIRALAX / GLYCOLAX) 17 g packet Take 17 g by mouth 2 (two) times daily. Patient  not taking: No sig reported 10/30/20   Regalado, Belkys A, MD  furosemide (LASIX) 20 MG tablet Take 3 tablets (60 mg total) by mouth 2 (two) times daily. 11/23/20 11/29/20  Larey Dresser, MD   Allergies  Allergen Reactions  . Sotalol Other (See Comments)    Prolonged QTc  . Alendronate Sodium Other (See Comments)    Arm pain  . Compazine [Prochlorperazine] Other (See Comments)    Nervous reaction  . Dofetilide Nausea Only    Reported by [facility] physicians  . Lisinopril Other (See Comments)    Low bp  . Ranexa [Ranolazine] Nausea Only  . Septra [Sulfamethoxazole-Trimethoprim] Nausea Only  . Zolpidem Tartrate Er Other (See Comments)    Cannot take extended release - makes her too tired the next day  . Ciprofloxacin Other (See Comments)    Not effective  . Coreg [Carvedilol] Other (See Comments)    Fatigue   . Metoprolol Other (See Comments)    Profound fatigue   Review of Systems  Constitutional: Positive for activity change.       Right hip pain   Physical Exam Vitals and nursing note reviewed.  Constitutional:      General: She is awake.  HENT:     Head: Normocephalic and atraumatic.  Cardiovascular:     Comments: Milrinone gtt Pulmonary:     Effort: No tachypnea, accessory muscle usage or respiratory distress.  Skin:    General: Skin is warm and dry.  Neurological:     Mental Status: She is alert and oriented to person, place, and time.  Psychiatric:        Mood and Affect: Mood normal.         Speech: Speech normal.        Behavior: Behavior normal.        Cognition and Memory: Cognition normal.     Vital Signs: BP 119/70 (BP Location: Right Arm)   Pulse 79   Temp 97.6 F (36.4 C) (Oral)   Resp 18   Ht '5\' 5"'  (1.651 m)   Wt 59.2 kg   SpO2 95%   BMI 21.72 kg/m  Pain Scale: 0-10   Pain Score: 2    SpO2: SpO2: 95 % O2 Device:SpO2: 95 % O2 Flow Rate: .O2 Flow Rate (L/min): 4 L/min  IO: Intake/output summary:   Intake/Output Summary (Last 24 hours) at 11/18/2020 7867 Last data filed at 11/15/2020 0600 Gross per 24 hour  Intake 572.24 ml  Output 375 ml  Net 197.24 ml    LBM: Last BM Date: 12/05/20 Baseline Weight: Weight: 56.2 kg Most recent weight: Weight: 59.2 kg     Palliative Assessment/Data: PPS 30%     Time Total: 60 Greater than 50%  of this time was spent counseling and coordinating care related to the above assessment and plan.  Signed by:  Ihor Dow, DNP, FNP-C Palliative Medicine Team  Phone: [phone] Fax: [phone]   Please contact Palliative Medicine Team phone at [phone] for questions and concerns.  For individual provider: See Shea Evans

## 2020-12-07 NOTE — Progress Notes (Signed)
Patient ID: Cheryl Vargas, female   DOB: Jan 04, 1935, 86 y.o.   MRN: [id number]  Right non-displaced impacted femoral neck fracture  Reviewing consultation notes I understand the complexity of the decision making involved.  I will wait to hear back from primary team on the patients decision.  If non-op management selected then careful movement in and NWB RLE with transfers to chairs.  Given the appearance of her hip in Xray she may very well tolerate this with appropriate pain control.  INR 1.7  Patient is awaiting discussion with her family.  Unless cardiac meds stopped she likely will not be [employer] candidate but could be transfer to SNF with hospice continuing at that location.

## 2020-12-07 NOTE — Progress Notes (Signed)
PROGRESS NOTE    Cheryl Pistole [place]  ZWCCheryl [id number] DOB: 06/10/1935 DOA: 11/10/2020 PCP: Lavone Orn, MD     Brief Narrative:  Cheryl Vargas Vargas an 86 y.o. female with medical history significant of hypertension, hyperlipidemia, nonischemic cardiomyopathy, chronic systolic CHF with EF 23-53% on milrinone, permanent atrial fibrillation, severe mitral valve regurgitation, hypothyroidism, Cheryl Vargas CKD stage IIIb presents after having a fall at home.  She has been getting around with the use of a walker Cheryl Vargas has a Actuary.  Patient had just been hospitalized 2/11-2/19 for acute on chronic systolic congestive heart failure exacerbation.  She required being placed on milrinone Cheryl Vargas was treated with IV Lasix.  Cardiology had evaluated Cheryl Vargas evaluated for milrinone at home, Cheryl suggested if condition deteriorated patient should be on hospice.  She subsequently presented back to the hospital on 3/21, with complaints of shortness of breath Cheryl Vargas had been given 80 mg of Lasix IV, Cheryl Vargas switch from p.o. Lasix to torsemide.  Since that time she reported that the swelling has improved some Cheryl Vargas her weight Vargas around 124 pounds.  She had been trying to get up from the potty chair this morning when her right leg slipped Cheryl Vargas she fell hitting her head on the carpet.  Denied any loss of consciousness Cheryl Vargas was able to be helped up by her sitter.  She complained of having significant pain Cheryl the right leg Cheryl Vargas had taken 2 Tylenol for symptoms at home.  She called hospice Cheryl Vargas they ultimately got her here to the hospital.   CT scan of the head without contrast showed no acute abnormalities.  X-rays revealed impacted subcapital femoral neck fracture on the right.    New events last 24 hours / Subjective: Patient states that she does not have pain at rest, Cheryl does have significant pain with any movement.  She has not yet decided on hospice, awaiting further family discussion.  Assessment & Plan:   Principal Problem:   Fracture of  femoral neck, right, closed ([place]) Active Problems:   Permanent atrial fibrillation ([facility])   Chronic anticoagulation   DNR (do not resuscitate) discussion   Fall at home, initial encounter   Acute kidney injury superimposed on CKD ([place])   Hypothyroidism   Closed fracture of right femoral neck secondary to fall -Appreciate cardiology input, patient remains high risk of peri-operative complication -Orthopedic surgery following for patient Cheryl Vargas family's final decision -Palliative care consulted    AKI on CKD stage 4 -Baseline Cr 1.27 -Continue to monitor kidney function with diuresis  Acute on chronic systolic congestive heart failure, Severe MR/TR Cheryl Vargas moderate A -Cardiology following -Continue current medication regimen midodrine, milrinone, torsemide, Cheryl Vargas lasix  Permanent atrial fibrillation on chronic anticoagulation -Status post AV nodal ablation Cheryl Vargas biventricular pacing -Patient Vargas on Xarelto Cheryl Vargas amiodarone PTA   Hypothyroidism  -Continue levothyroxine  Anxiety -Continue Ativan Vargas needed     DVT prophylaxis:  SCDs Start: 12/03/2020 6144  Code Status: DNR Family Communication: None at bedside Disposition Plan:  Status Vargas: Inpatient  Remains inpatient appropriate becauseCheryl VargasOngoing active pain requiring inpatient pain management Cheryl Vargas Unsafe d/c plan   Dispo: The patient Vargas from: Home              Anticipated d/c Vargas to: SNF vs hospice              Patient currently Vargas not medically stable to d/c.   Difficult to place patient No    Consultants:   Orthopedic surgery  Cardiology  Palliative  care medicine   Antimicrobials:  Anti-infectives (From admission, onward)   None        Objective: Vitals:   12/08/2020 2355 11/24/2020 0315 12/05/2020 0319 11/29/2020 1226  BP: (!) 92/50 119/70  (!) 101/52  Pulse: 80 79  79  Resp: 15 18  18   Temp:  97.6 F (36.4 C)  97.7 F (36.5 C)  TempSrc:  Oral  Oral  SpO2: 95% 95%  93%  Weight:   59.2 kg   Height:         Intake/Output Summary (Last 24 hours) at 11/24/2020 1326 Last data filed at 12/06/2020 1120 Gross per 24 hour  Intake 858.66 ml  Output 375 ml  Net 483.66 ml   Filed Weights   12/02/2020 0333 12/01/2020 0319  Weight: 56.2 kg 59.2 kg    Examination:  General exam: Cheryl calm Cheryl Vargas comfortable, frail  Respiratory system: Clear to auscultation. Respiratory effort normal. No respiratory distress. No conversational dyspnea.  Cardiovascular system: S1 & S2 heard, RRR. No murmurs. No pedal edema. Gastrointestinal system: Abdomen Vargas nondistended, soft Cheryl Vargas nontender. Normal bowel sounds heard. Central nervous system: Alert Cheryl Vargas oriented. No focal neurological deficits. Speech clear.  Extremities: Symmetric Cheryl appearance  Skin: No rashes, lesions or ulcers on exposed skin  Psychiatry: Judgement Cheryl Vargas insight appear normal. Mood & affect appropriate.   Data Reviewed: I have personally reviewed following labs Cheryl Vargas imaging studies  CBC: Recent Labs  Lab 11/12/2020 0500 11/12/2020 0805  WBC 6.8 5.4  NEUTROABS 5.5  --   HGB 10.4* 9.5*  HCT 33.1* 31.3*  MCV 73.2* 73.6*  PLT 225 518   Basic Metabolic Panel: Recent Labs  Lab 11/24/2020 0500 12/05/2020 0805  NA 132* 136  K 3.7 3.8  CL 97* 98  CO2 26 31  GLUCOSE 126* 112*  BUN 33* 31*  CREATININE 1.70* 1.67*  CALCIUM 8.5* 8.6*   GFR: Estimated Creatinine Clearance: 23 mL/min (A) (by C-G formula based on SCr of 1.67 mg/dL (H)). Liver Function Tests: No results for input(s): AST, ALT, ALKPHOS, BILITOT, PROT, ALBUMIN Cheryl the last 168 hours. No results for input(s): LIPASE, AMYLASE Cheryl the last 168 hours. No results for input(s): AMMONIA Cheryl the last 168 hours. Coagulation Profile: Recent Labs  Lab 12/08/2020 0525  INR 1.7*   Cardiac Enzymes: No results for input(s): CKTOTAL, CKMB, CKMBINDEX, TROPONINI Cheryl the last 168 hours. BNP (last 3 results) No results for input(s): PROBNP Cheryl the last 8760 hours. HbA1C: No results for input(s): HGBA1C  Cheryl the last 72 hours. CBG: No results for input(s): GLUCAP Cheryl the last 168 hours. Lipid Profile: No results for input(s): CHOL, HDL, LDLCALC, TRIG, CHOLHDL, LDLDIRECT Cheryl the last 72 hours. Thyroid Function Tests: Recent Labs    11/23/2020 0500  TSH 14.648*   Anemia Panel: No results for input(s): VITAMINB12, FOLATE, FERRITIN, TIBC, IRON, RETICCTPCT Cheryl the last 72 hours. Sepsis Labs: No results for input(s): PROCALCITON, LATICACIDVEN Cheryl the last 168 hours.  Recent Results (from the past 240 hour(s))  Resp Panel by RT-PCR (Flu A&B, Covid) Nasopharyngeal Swab     Status: None   Collection Time: 11/29/20  9Cheryl Vargas30 AM   Specimen: Nasopharyngeal Swab; Nasopharyngeal(NP) swabs Cheryl vial transport medium  Result Value Ref Range Status   SARS Coronavirus 2 by RT PCR NEGATIVE NEGATIVE Final    Comment: (NOTE) SARS-CoV-2 target nucleic acids are NOT DETECTED.  The SARS-CoV-2 RNA Vargas generally detectable Cheryl upper respiratory specimens during the acute phase of infection. The lowest concentration  of SARS-CoV-2 viral copies this assay can detect Vargas 138 copies/mL. A negative result does not preclude SARS-Cov-2 infection Cheryl Vargas should not be used Vargas the sole basis for treatment or other patient management decisions. A negative result may occur with  improper specimen collection/handling, submission of specimen other than nasopharyngeal swab, presence of viral mutation(s) within the areas targeted by this assay, Cheryl Vargas inadequate number of viral copies(<138 copies/mL). A negative result must be combined with clinical observations, patient history, Cheryl Vargas epidemiological information. The expected result Vargas Negative.  Fact Sheet for Patients:  EntrepreneurPulse.com.au  Fact Sheet for Healthcare Providers:  IncredibleEmployment.be  This test Vargas no t yet approved or cleared by the [place] FDA Cheryl Vargas  has been authorized for detection Cheryl Vargas/or diagnosis of SARS-CoV-2 by FDA  under an Emergency Use Authorization (EUA). This EUA will remain  Cheryl effect (meaning this test can be used) for the duration of the COVID-19 declaration under Section 564(b)(1) of the Act, 21 U.S.C.section 360bbb-3(b)(1), unless the authorization Vargas terminated  or revoked sooner.       Influenza A by PCR NEGATIVE NEGATIVE Final   Influenza B by PCR NEGATIVE NEGATIVE Final    Comment: (NOTE) The Xpert Xpress SARS-CoV-2/FLU/RSV plus assay Vargas intended Vargas an aid Cheryl the diagnosis of influenza from Nasopharyngeal swab specimens Cheryl Vargas should not be used Vargas a sole basis for treatment. Nasal washings Cheryl Vargas aspirates are unacceptable for Xpert Xpress SARS-CoV-2/FLU/RSV testing.  Fact Sheet for Patients: EntrepreneurPulse.com.au  Fact Sheet for Healthcare Providers: IncredibleEmployment.be  This test Vargas not yet approved or cleared by the [place] FDA Cheryl Vargas has been authorized for detection Cheryl Vargas/or diagnosis of SARS-CoV-2 by FDA under an Emergency Use Authorization (EUA). This EUA will remain Cheryl effect (meaning this test can be used) for the duration of the COVID-19 declaration under Section 564(b)(1) of the Act, 21 U.S.C. section 360bbb-3(b)(1), unless the authorization Vargas terminated or revoked.  Performed at [facility], [place] [address] [place], [place] [address]   Resp Panel by RT-PCR (Flu A&B, Covid) Nasopharyngeal Swab     Status: None   Collection Time: 12/08/2020  5Cheryl Vargas00 AM   Specimen: Nasopharyngeal Swab; Nasopharyngeal(NP) swabs Cheryl vial transport medium  Result Value Ref Range Status   SARS Coronavirus 2 by RT PCR NEGATIVE NEGATIVE Final    Comment: (NOTE) SARS-CoV-2 target nucleic acids are NOT DETECTED.  The SARS-CoV-2 RNA Vargas generally detectable Cheryl upper respiratory specimens during the acute phase of infection. The lowest concentration of SARS-CoV-2 viral copies this assay can detect Vargas 138 copies/mL. A negative result does not  preclude SARS-Cov-2 infection Cheryl Vargas should not be used Vargas the sole basis for treatment or other patient management decisions. A negative result may occur with  improper specimen collection/handling, submission of specimen other than nasopharyngeal swab, presence of viral mutation(s) within the areas targeted by this assay, Cheryl Vargas inadequate number of viral copies(<138 copies/mL). A negative result must be combined with clinical observations, patient history, Cheryl Vargas epidemiological information. The expected result Vargas Negative.  Fact Sheet for Patients:  EntrepreneurPulse.com.au  Fact Sheet for Healthcare Providers:  IncredibleEmployment.be  This test Vargas no t yet approved or cleared by the [place] FDA Cheryl Vargas  has been authorized for detection Cheryl Vargas/or diagnosis of SARS-CoV-2 by FDA under an Emergency Use Authorization (EUA). This EUA will remain  Cheryl effect (meaning this test can be used) for the duration of the COVID-19 declaration under Section 564(b)(1) of the Act, 21 U.S.C.section 360bbb-3(b)(1), unless the authorization Vargas terminated  or revoked sooner.       Influenza A by PCR NEGATIVE NEGATIVE Final   Influenza B by PCR NEGATIVE NEGATIVE Final    Comment: (NOTE) The Xpert Xpress SARS-CoV-2/FLU/RSV plus assay Vargas intended Vargas an aid Cheryl the diagnosis of influenza from Nasopharyngeal swab specimens Cheryl Vargas should not be used Vargas a sole basis for treatment. Nasal washings Cheryl Vargas aspirates are unacceptable for Xpert Xpress SARS-CoV-2/FLU/RSV testing.  Fact Sheet for Patients: EntrepreneurPulse.com.au  Fact Sheet for Healthcare Providers: IncredibleEmployment.be  This test Vargas not yet approved or cleared by the [place] FDA Cheryl Vargas has been authorized for detection Cheryl Vargas/or diagnosis of SARS-CoV-2 by FDA under an Emergency Use Authorization (EUA). This EUA will remain Cheryl effect (meaning this test can be used) for the  duration of the COVID-19 declaration under Section 564(b)(1) of the Act, 21 U.S.C. section 360bbb-3(b)(1), unless the authorization Vargas terminated or revoked.  Performed at [facility], [place] [address], [place], [place] [address]       Radiology Studies: CT Head Wo Contrast  Result Date: 11/10/2020 CLINICAL DATA:  Fall, head injury EXAM: CT HEAD WITHOUT CONTRAST TECHNIQUE: Contiguous axial images were obtained from the base of the skull through the vertex without intravenous contrast. COMPARISON:  11/29/2020 FINDINGS: Brain: Normal anatomic configuration. Parenchymal volume loss Vargas commensurate with the patient's age. Mild periventricular white matter changes are present Cheryl reflecting the sequela of small vessel ischemia. No abnormal intra or extra-axial mass lesion or fluid collection. No abnormal mass effect or midline shift. No evidence of acute intracranial hemorrhage or infarct. Ventricular size Vargas normal. Cerebellum unremarkable. Vascular: No asymmetric hyperdense vasculature at the skull base. Skull: Intact Sinuses/Orbits: Paranasal sinuses are clear. Orbits are unremarkable. Other: Mastoid air cells Cheryl Vargas middle ear cavities are clear. IMPRESSION: No acute intracranial abnormality. No calvarial fracture. Mild senescent change. Electronically Signed   By: Fidela Salisbury MD   On: 11/13/2020 04Cheryl Vargas09   Chest Portable 1 View  Result Date: 11/26/2020 CLINICAL DATA:  Fall this morning, hip fracture, initial encounter. EXAM: PORTABLE CHEST 1 VIEW COMPARISON:  11/29/2020. FINDINGS: Trachea Vargas midline. Heart Vargas enlarged, stable. Thoracic aorta Vargas calcified. Right IJ central line tip Vargas Cheryl the SVC. Pacemaker Cheryl Vargas ICD lead tips are stable Cheryl position. Lungs are clear.  No pleural fluid. IMPRESSION: 1. No acute findings. 2.  Aortic atherosclerosis (ICD10-I70.0). Electronically Signed   By: Lorin Picket M.D.   On: 11/21/2020 08Cheryl Vargas38   DG Hip Unilat With Pelvis 2-3 Views Right  Result Date:  11/12/2020 CLINICAL DATA:  Fall with hip pain EXAM: DG HIP (WITH OR WITHOUT PELVIS) 2-3V RIGHT COMPARISON:  08/13/2020 FINDINGS: Impacted subcapital right femoral neck fracture. Left hip arthroplasty Cheryl Vargas intact where covered. No evidence of pelvic ring fracture or diastasis. IMPRESSION: Impacted, subcapital femoral neck fracture on the right. Electronically Signed   By: Monte Fantasia M.D.   On: 11/09/2020 04Cheryl Vargas18      Scheduled Meds: . amiodarone  200 mg Oral Daily  . Chlorhexidine Gluconate Cloth  6 each Topical Daily  . furosemide  80 mg Intravenous Once  . levothyroxine  50 mcg Oral Q0600  . magnesium oxide  200 mg Oral BID  . midodrine  10 mg Oral BID WC  . potassium chloride SA  40 mEq Oral BID  . rOPINIRole  0.5 mg Oral Q2000  . scopolamine  1 patch Transdermal Q72H  . senna-docusate  1 tablet Oral QHS  . torsemide  60 mg Oral Daily  .  zolpidem  5 mg Oral QHS   Continuous Infusions: . milrinone 0.5 mcg/kg/min (11/25/2020 1120)     LOS: 1 day      Time spent: 20 minutes   Dessa Phi, DO [facility] 11/13/2020, 1Cheryl Vargas26 PM   Available via Epic secure chat 7am-7pm After these hours, please refer to coverage provider listed on [email or web address]

## 2020-12-07 NOTE — Plan of Care (Signed)

## 2020-12-08 DIAGNOSIS — N179 Acute kidney failure, unspecified: Secondary | ICD-10-CM | POA: Diagnosis not present

## 2020-12-08 DIAGNOSIS — E039 Hypothyroidism, unspecified: Secondary | ICD-10-CM

## 2020-12-08 DIAGNOSIS — W19XXXA Unspecified fall, initial encounter: Secondary | ICD-10-CM | POA: Diagnosis not present

## 2020-12-08 DIAGNOSIS — I5084 End stage heart failure: Secondary | ICD-10-CM

## 2020-12-08 DIAGNOSIS — S72001A Fracture of unspecified part of neck of right femur, initial encounter for closed fracture: Secondary | ICD-10-CM | POA: Diagnosis not present

## 2020-12-08 DIAGNOSIS — Z7901 Long term (current) use of anticoagulants: Secondary | ICD-10-CM | POA: Diagnosis not present

## 2020-12-08 DIAGNOSIS — I5043 Acute on chronic combined systolic (congestive) and diastolic (congestive) heart failure: Secondary | ICD-10-CM | POA: Diagnosis not present

## 2020-12-08 LAB — COOXEMETRY PANEL
Carboxyhemoglobin: 1.3 % (ref 0.5–1.5)
Carboxyhemoglobin: 1.4 % (ref 0.5–1.5)
Methemoglobin: 0.7 % (ref 0.0–1.5)
Methemoglobin: 0.7 % (ref 0.0–1.5)
O2 Saturation: 37.6 %
O2 Saturation: 40.8 %
Total hemoglobin: 10.3 g/dL — ABNORMAL LOW (ref 12.0–16.0)
Total hemoglobin: 10.4 g/dL — ABNORMAL LOW (ref 12.0–16.0)

## 2020-12-08 LAB — BASIC METABOLIC PANEL
Anion gap: 9 (ref 5–15)
BUN: 33 mg/dL — ABNORMAL HIGH (ref 8–23)
CO2: 29 mmol/L (ref 22–32)
Calcium: 8.6 mg/dL — ABNORMAL LOW (ref 8.9–10.3)
Chloride: 96 mmol/L — ABNORMAL LOW (ref 98–111)
Creatinine, Ser: 1.78 mg/dL — ABNORMAL HIGH (ref 0.44–1.00)
GFR, Estimated: 28 mL/min — ABNORMAL LOW (ref >60)
Glucose, Bld: 126 mg/dL — ABNORMAL HIGH (ref 70–99)
Potassium: 4.1 mmol/L (ref 3.5–5.1)
Sodium: 134 mmol/L — ABNORMAL LOW (ref 135–145)

## 2020-12-08 LAB — CBC
HCT: 32.7 % — ABNORMAL LOW (ref 36.0–46.0)
Hemoglobin: 10 g/dL — ABNORMAL LOW (ref 12.0–15.0)
MCH: 22.5 pg — ABNORMAL LOW (ref 26.0–34.0)
MCHC: 30.6 g/dL (ref 30.0–36.0)
MCV: 73.6 fL — ABNORMAL LOW (ref 80.0–100.0)
Platelets: 230 10*3/uL (ref 150–400)
RBC: 4.44 MIL/uL (ref 3.87–5.11)
RDW: 18.2 % — ABNORMAL HIGH (ref 11.5–15.5)
WBC: 6.5 10*3/uL (ref 4.0–10.5)
nRBC: 0 % (ref 0.0–0.2)

## 2020-12-08 LAB — MAGNESIUM: Magnesium: 2.2 mg/dL (ref 1.7–2.4)

## 2020-12-08 NOTE — Progress Notes (Signed)
PROGRESS NOTE    Cheryl Vargas  OFB:[id number] DOB: 30-Aug-1935 DOA: 11/26/2020 PCP: Lavone Orn, MD     Brief Narrative:  Cheryl Vargas is an 86 y.o.  female with past medical history of hypertension, hyperlipidemia, nonischemic cardiomyopathy, chronic systolic CHF with EF 78-24% on milrinone, permanent atrial fibrillation, severe mitral valve regurgitation, hypothyroidism, and CKD stage IIIb presented to hospital after sustaining a fall at home.  At baseline she uses a walker and has a Actuary.  Patient was recently hospitalized during  2/11-2/19 for acute on chronic systolic congestive heart failure exacerbation.  She required being placed on milrinone and was treated with IV Lasix.  Cardiology had evaluated and evaluated for milrinone at home, but suggested if condition deteriorated patient should be on hospice.  She subsequently presented back to the hospital on 3/21, with complaints of shortness of breath and had been given 80 mg of Lasix IV, and switch from p.o. Lasix to torsemide.  This time she was trying to get up off from the Referral in her right leg slipped and she fell hitting her head on the carpet.  CT scan of the head did not show any acute findings.  X-ray of the right femur showed impacted femoral neck fracture.   Assessment & Plan:   Principal Problem:   Fracture of femoral neck, right, closed ([place]) Active Problems:   Permanent atrial fibrillation (HCC)   Chronic anticoagulation   DNR (do not resuscitate) discussion   Fall at home, initial encounter   Acute kidney injury superimposed on CKD ([place])   Hypothyroidism  Closed fracture of right femoral neck secondary to fall Seen by orthopedic surgery.  Patient is considered to be high risk for perioperative complication patient with advanced heart failure on palliative milrinone.  Orthopedic recommended conservative treatment with nonweightbearing on the right lower extremity with transfer to chair if no surgery is  undertaken.Marland Kitchen  Spoke with the patient's son at bedside.  He wishes physical therapy continuation in the hospital for increased activity.  We will get physical therapy evaluation for mobilization out of the bed and sit on the chair.   AKI on CKD stage 4 -Baseline Cr 1.27 -Continue to monitor kidney function with diuresis  Acute on chronic systolic congestive heart failure, Severe MR/TR  Cardiology on board.  Patient is on palliative milrinone at home.  Continue midodrine, torsemide,   Permanent atrial fibrillation on chronic anticoagulation. Status post AV nodal ablation and biventricular pacing.  Continue Xarelto, amiodarone  Hypothyroidism  Continue Synthroid  Anxiety Continue Ativan.  Goals of care..  Patient is on hospice care with milrinone at home.  Overall prognosis is poor.  Patient is high risk for surgical intervention.  Patient's family wishes increased activity.  Communicated with the team.  DVT prophylaxis:  SCDs Start: 11/27/2020 2353  Code Status: DNR  Family Communication:  I spoke with the patient/son at bedside at length regarding the plan for disposition.  Disposition Plan:  Status is: Inpatient  Remains inpatient appropriate because:Ongoing active pain requiring inpatient pain management and Unsafe d/c plan  Dispo: The patient is from: Home              Anticipated d/c is to: SNF vs hospice, on milrinone drip.              Patient currently is not medically stable to d/c.   Difficult to place patient No   Consultants:   Orthopedic surgery  Cardiology  Palliative care    Antimicrobials:  None  Subjective. Today patient was seen and examined at bedside.  Complains of mild hip pain.  Denies any shortness of breath fever chills.  Patient son at bedside.  Objective: Vitals:   12/08/20 0020 12/08/20 0423 12/08/20 0806 12/08/20 1239  BP: 116/74 125/69 127/83 122/71  Pulse: 79 81 81 79  Resp: 15 20 16 17   Temp: 97.7 F (36.5 C) 97.8 F (36.6 C)  97.6 F (36.4 C)   TempSrc: Oral Oral Oral Oral  SpO2: 90% 91% 97% 96%  Weight:  57.1 kg    Height:        Intake/Output Summary (Last 24 hours) at 12/08/2020 1414 Last data filed at 12/08/2020 0800 Gross per 24 hour  Intake 357.12 ml  Output 300 ml  Net 57.12 ml   Filed Weights   11/14/2020 0333 12/06/2020 0319 12/08/20 0423  Weight: 56.2 kg 59.2 kg 57.1 kg    Physical examination:  General: Frail, elderly female in no acute distress  HENT:   No scleral pallor or icterus noted. Oral mucosa is dry.  Right internal jugular tunneled CVC.  Elevated neck veins. Chest:    Diminished breath sounds bilaterally.  CVS: S1 &S2 heard. No murmur.  Regular rate and rhythm. Abdomen: Soft, nontender, nondistended.  Bowel sounds are heard.  External urinary catheter in place. Extremities: No cyanosis, clubbing or edema.  Peripheral pulses are palpable. Psych: Alert, awake and communicative CNS:  No cranial nerve deficits.  Moves all extremities. Skin: Warm and dry.    Data Reviewed: I have personally reviewed following labs and imaging studies  CBC: Recent Labs  Lab 12/09/2020 0500 12/09/2020 0805 12/08/20 0555  WBC 6.8 5.4 6.5  NEUTROABS 5.5  --   --   HGB 10.4* 9.5* 10.0*  HCT 33.1* 31.3* 32.7*  MCV 73.2* 73.6* 73.6*  PLT 225 207 517   Basic Metabolic Panel: Recent Labs  Lab 11/10/2020 0500 11/16/2020 0805 12/08/20 0555  NA 132* 136 134*  K 3.7 3.8 4.1  CL 97* 98 96*  CO2 26 31 29   GLUCOSE 126* 112* 126*  BUN 33* 31* 33*  CREATININE 1.70* 1.67* 1.78*  CALCIUM 8.5* 8.6* 8.6*  MG  --   --  2.2   GFR: Estimated Creatinine Clearance: 21.5 mL/min (A) (by C-G formula based on SCr of 1.78 mg/dL (H)). Liver Function Tests: No results for input(s): AST, ALT, ALKPHOS, BILITOT, PROT, ALBUMIN in the last 168 hours. No results for input(s): LIPASE, AMYLASE in the last 168 hours. No results for input(s): AMMONIA in the last 168 hours. Coagulation Profile: Recent Labs  Lab 11/24/2020 0525   INR 1.7*   Cardiac Enzymes: No results for input(s): CKTOTAL, CKMB, CKMBINDEX, TROPONINI in the last 168 hours. BNP (last 3 results) No results for input(s): PROBNP in the last 8760 hours. HbA1C: No results for input(s): HGBA1C in the last 72 hours. CBG: No results for input(s): GLUCAP in the last 168 hours. Lipid Profile: No results for input(s): CHOL, HDL, LDLCALC, TRIG, CHOLHDL, LDLDIRECT in the last 72 hours. Thyroid Function Tests: Recent Labs    11/27/2020 0500  TSH 14.648*   Anemia Panel: No results for input(s): VITAMINB12, FOLATE, FERRITIN, TIBC, IRON, RETICCTPCT in the last 72 hours. Sepsis Labs: No results for input(s): PROCALCITON, LATICACIDVEN in the last 168 hours.  Recent Results (from the past 240 hour(s))  Resp Panel by RT-PCR (Flu A&B, Covid) Nasopharyngeal Swab     Status: None   Collection Time: 11/29/20  9:30 AM  Specimen: Nasopharyngeal Swab; Nasopharyngeal(NP) swabs in vial transport medium  Result Value Ref Range Status   SARS Coronavirus 2 by RT PCR NEGATIVE NEGATIVE Final    Comment: (NOTE) SARS-CoV-2 target nucleic acids are NOT DETECTED.  The SARS-CoV-2 RNA is generally detectable in upper respiratory specimens during the acute phase of infection. The lowest concentration of SARS-CoV-2 viral copies this assay can detect is 138 copies/mL. A negative result does not preclude SARS-Cov-2 infection and should not be used as the sole basis for treatment or other patient management decisions. A negative result may occur with  improper specimen collection/handling, submission of specimen other than nasopharyngeal swab, presence of viral mutation(s) within the areas targeted by this assay, and inadequate number of viral copies(<138 copies/mL). A negative result must be combined with clinical observations, patient history, and epidemiological information. The expected result is Negative.  Fact Sheet for Patients:   EntrepreneurPulse.com.au  Fact Sheet for Healthcare Providers:  IncredibleEmployment.be  This test is no t yet approved or cleared by the [place] FDA and  has been authorized for detection and/or diagnosis of SARS-CoV-2 by FDA under an Emergency Use Authorization (EUA). This EUA will remain  in effect (meaning this test can be used) for the duration of the COVID-19 declaration under Section 564(b)(1) of the Act, 21 U.S.C.section 360bbb-3(b)(1), unless the authorization is terminated  or revoked sooner.       Influenza A by PCR NEGATIVE NEGATIVE Final   Influenza B by PCR NEGATIVE NEGATIVE Final    Comment: (NOTE) The Xpert Xpress SARS-CoV-2/FLU/RSV plus assay is intended as an aid in the diagnosis of influenza from Nasopharyngeal swab specimens and should not be used as a sole basis for treatment. Nasal washings and aspirates are unacceptable for Xpert Xpress SARS-CoV-2/FLU/RSV testing.  Fact Sheet for Patients: EntrepreneurPulse.com.au  Fact Sheet for Healthcare Providers: IncredibleEmployment.be  This test is not yet approved or cleared by the [place] FDA and has been authorized for detection and/or diagnosis of SARS-CoV-2 by FDA under an Emergency Use Authorization (EUA). This EUA will remain in effect (meaning this test can be used) for the duration of the COVID-19 declaration under Section 564(b)(1) of the Act, 21 U.S.C. section 360bbb-3(b)(1), unless the authorization is terminated or revoked.  Performed at [facility], [place] [address] [place], [place] [address]   Resp Panel by RT-PCR (Flu A&B, Covid) Nasopharyngeal Swab     Status: None   Collection Time: 11/09/2020  5:00 AM   Specimen: Nasopharyngeal Swab; Nasopharyngeal(NP) swabs in vial transport medium  Result Value Ref Range Status   SARS Coronavirus 2 by RT PCR NEGATIVE NEGATIVE Final    Comment: (NOTE) SARS-CoV-2  target nucleic acids are NOT DETECTED.  The SARS-CoV-2 RNA is generally detectable in upper respiratory specimens during the acute phase of infection. The lowest concentration of SARS-CoV-2 viral copies this assay can detect is 138 copies/mL. A negative result does not preclude SARS-Cov-2 infection and should not be used as the sole basis for treatment or other patient management decisions. A negative result may occur with  improper specimen collection/handling, submission of specimen other than nasopharyngeal swab, presence of viral mutation(s) within the areas targeted by this assay, and inadequate number of viral copies(<138 copies/mL). A negative result must be combined with clinical observations, patient history, and epidemiological information. The expected result is Negative.  Fact Sheet for Patients:  EntrepreneurPulse.com.au  Fact Sheet for Healthcare Providers:  IncredibleEmployment.be  This test is no t yet approved or cleared by  the [employer] and  has been authorized for detection and/or diagnosis of SARS-CoV-2 by FDA under an Emergency Use Authorization (EUA). This EUA will remain  in effect (meaning this test can be used) for the duration of the COVID-19 declaration under Section 564(b)(1) of the Act, 21 U.S.C.section 360bbb-3(b)(1), unless the authorization is terminated  or revoked sooner.       Influenza A by PCR NEGATIVE NEGATIVE Final   Influenza B by PCR NEGATIVE NEGATIVE Final    Comment: (NOTE) The Xpert Xpress SARS-CoV-2/FLU/RSV plus assay is intended as an aid in the diagnosis of influenza from Nasopharyngeal swab specimens and should not be used as a sole basis for treatment. Nasal washings and aspirates are unacceptable for Xpert Xpress SARS-CoV-2/FLU/RSV testing.  Fact Sheet for Patients: EntrepreneurPulse.com.au  Fact Sheet for Healthcare  Providers: IncredibleEmployment.be  This test is not yet approved or cleared by the [place] FDA and has been authorized for detection and/or diagnosis of SARS-CoV-2 by FDA under an Emergency Use Authorization (EUA). This EUA will remain in effect (meaning this test can be used) for the duration of the COVID-19 declaration under Section 564(b)(1) of the Act, 21 U.S.C. section 360bbb-3(b)(1), unless the authorization is terminated or revoked.  Performed at [facility], [place] [address] [place], [place] [address]       Radiology Studies: No results found.   Scheduled Meds: . amiodarone  200 mg Oral Daily  . Chlorhexidine Gluconate Cloth  6 each Topical Daily  . levothyroxine  50 mcg Oral Q0600  . magnesium oxide  200 mg Oral BID  . midodrine  10 mg Oral BID WC  . potassium chloride SA  40 mEq Oral BID  . rOPINIRole  0.5 mg Oral Q2000  . scopolamine  1 patch Transdermal Q72H  . senna-docusate  1 tablet Oral QHS  . torsemide  60 mg Oral Daily  . zolpidem  5 mg Oral QHS   Continuous Infusions: . milrinone 0.5 mcg/kg/min (12/08/20 1343)     LOS: 2 days   Flora Lipps, MD [facility] 12/08/2020, 2:14 PM

## 2020-12-08 NOTE — Progress Notes (Signed)
Patient ID: Cheryl Vargas, female   DOB: [birth year], 86 y.o.   MRN: [id number]     Advanced Heart Failure Rounding Note  PCP-Cardiologist: [name], MD   Subjective:    Co-ox 41% this morning (low).  Bed weight down with IV Lasix yesterday.   She has mild hip pain currently, denies dyspnea.   Objective:   Weight Range: 57.1 kg Body mass index is 20.95 kg/m.   Vital Signs:   Temp:  [97.4 F (36.3 C)-97.8 F (36.6 C)] 97.6 F (36.4 C) (03/30 0806) Pulse Rate:  [79-82] 81 (03/30 0806) Resp:  [15-22] 16 (03/30 0806) BP: (101-129)/(52-83) 127/83 (03/30 0806) SpO2:  [90 %-97 %] 97 % (03/30 0806) Weight:  [57.1 kg] 57.1 kg (03/30 0423) Last BM Date: 12/05/20  Weight change: Filed Weights   11/13/2020 0333 12/02/2020 0319 12/08/20 0423  Weight: 56.2 kg 59.2 kg 57.1 kg    Intake/Output:   Intake/Output Summary (Last 24 hours) at 12/08/2020 0832 Last data filed at 12/08/2020 0600 Gross per 24 hour  Intake 543.54 ml  Output 300 ml  Net 243.54 ml      Physical Exam    General: NAD, frail Neck: JVP 8-9, no thyromegaly or thyroid nodule.  Lungs: Clear to auscultation bilaterally with normal respiratory effort. CV: Nondisplaced PMI.  Heart regular S1/S2, no S3/S4, 2/6 HSM apex.  No peripheral edema.  Abdomen: Soft, nontender, no hepatosplenomegaly, no distention.  Skin: Intact without lesions or rashes.  Neurologic: Alert and oriented x 3.  Psych: Normal affect. Extremities: No clubbing or cyanosis.  HEENT: Normal.    Telemetry   Afib with BiV- pacing 80s (personally reviewed)  EKG    No new EKG to review   Labs    CBC Recent Labs    11/18/2020 0500 11/22/2020 0805 12/08/20 0555  WBC 6.8 5.4 6.5  NEUTROABS 5.5  --   --   HGB 10.4* 9.5* 10.0*  HCT 33.1* 31.3* 32.7*  MCV 73.2* 73.6* 73.6*  PLT 225 207 154   Basic Metabolic Panel Recent Labs    11/28/2020 0805 12/08/20 0555  NA 136 134*  K 3.8 4.1  CL 98 96*  CO2 31 29  GLUCOSE 112* 126*  BUN 31*  33*  CREATININE 1.67* 1.78*  CALCIUM 8.6* 8.6*  MG  --  2.2   Liver Function Tests No results for input(s): AST, ALT, ALKPHOS, BILITOT, PROT, ALBUMIN in the last 72 hours. No results for input(s): LIPASE, AMYLASE in the last 72 hours. Cardiac Enzymes No results for input(s): CKTOTAL, CKMB, CKMBINDEX, TROPONINI in the last 72 hours.  BNP: BNP (last 3 results) Recent Labs    10/22/20 1339 11/29/20 0913 12/02/2020 1021  BNP 1,503.8* 1,429.6* 1,866.0*    ProBNP (last 3 results) No results for input(s): PROBNP in the last 8760 hours.   D-Dimer No results for input(s): DDIMER in the last 72 hours. Hemoglobin A1C No results for input(s): HGBA1C in the last 72 hours. Fasting Lipid Panel No results for input(s): CHOL, HDL, LDLCALC, TRIG, CHOLHDL, LDLDIRECT in the last 72 hours. Thyroid Function Tests Recent Labs    11/12/2020 0500  TSH 14.648*    Other results:   Imaging    No results found.   Medications:     Scheduled Medications: . amiodarone  200 mg Oral Daily  . Chlorhexidine Gluconate Cloth  6 each Topical Daily  . levothyroxine  50 mcg Oral Q0600  . magnesium oxide  200 mg Oral BID  .  midodrine  10 mg Oral BID WC  . potassium chloride SA  40 mEq Oral BID  . rOPINIRole  0.5 mg Oral Q2000  . scopolamine  1 patch Transdermal Q72H  . senna-docusate  1 tablet Oral QHS  . torsemide  60 mg Oral Daily  . zolpidem  5 mg Oral QHS    Infusions: . milrinone 0.5 mcg/kg/min (12/08/20 0134)    PRN Medications: fluticasone, HYDROcodone-acetaminophen, loperamide, LORazepam, morphine injection, morphine CONCENTRATE, ondansetron, phenol, simethicone    Assessment/Plan   1. Displaced right femoral head fracture: Patient is unable to walk.  At baseline, she is getting hospice care and is on high dose IV milrinone infusion for palliation.  She would be very high risk for surgery with end stage CHF and baseline frailty, even on max milrinone her CO as measured by co-ox  is low (41% this morning).  She is already under hospice care. She will not walk again without surgery, but I suspect the chances of her having surgery and then walking any time soon would be extremely small. I think that it would be reasonable to forego surgery and with hospice house [facility]) placement vs SNF with hospice.  She will discuss with her children.  - Ongoing discussions with family and palliative care needed for decision making.  2. Chronic Systolic Heart Failure:NICM,LHC 04/2016 showed no significant coronary disease. Cardiac MRI 10/17 EF 36%, diffuse HK, normal RV, biatrial enlargement, moderate MR, non-coronary LGE pattern involving the mid-wall of the basal to mid septum and inferior wall. Possibly prior myocarditis versus a form of infiltrative disease. [employer] CRT-D s/p AV nodal ablation. Echo in 1/21 showed EF <20% with moderate LV dilation and mildly decreased RV systolic function. Echo in 12/21 with EF 20-25%, severe MR, severe TR, moderate AI.Low output on 1/21 RHC (CI 1.72) and 12/21 RHC (CI 1.8). End stage HF now with home milrinone.  Baseline NYHA class IIIb-IV symptoms, no change. Volume status looks better after IV Lasix yesterday. Co-ox 42% today (low), but she is on maximum milrinone and no further options to improve this.  - Continue home torsemide 60 mg daily today (home regimen).  - Continue palliative milrinone 0.5 mcg/kg/min.  -She is off losartan, bisoprololand spironolactone with symptomatic hypotension and now is on midodrine 10 mg bid. - Off digoxin with elevated level. 3. CKD stage 3: Creatinine 1.7 on admit, this is around her recent baseline. F/u BMP pending  4.Atrial fibrillation: Chronic, s/p AV nodal ablation and BiV pacing.  - Xarelto 15 mg daily held, ?surgery.  5. PVCs/VT: Frequent PVCs have limited BiV pacing. Not a good PVC ablation candidate per EP. Had VT in 1/21. Cannot tolerate sotalol orTikosyn. Did not tolerate mexilitine with  nausea. Had nausea also with ranolazine. She had nausea in the past with amiodarone but seems to be tolerating it now. -Continue amiodarone 200 mg daily for PVC suppression to try to promote BiV pacing. 6. Mitral regurgitation:Severe on 12/21 echo, not candidate for intervention. 7. Hypothyroidism:On Levoxyl. Per PCP.  Length of Stay: 2  [name], MD  12/08/2020, 8:32 AM  Advanced Heart Failure Team Pager [phone] (M-F; 7a - 5p)  Please contact [facility] for night-coverage after hours (5p -7a ) and weekends on [email or web address]

## 2020-12-08 NOTE — Plan of Care (Signed)

## 2020-12-08 NOTE — TOC Initial Note (Signed)
Transition of Care [facility]) - Initial/Assessment Note    Patient Details  Name: Cheryl Vargas MRN: [id number] Date of Birth: 16-Sep-1934  Transition of Care [facility]) CM/SW Contact:    [name], RN Phone Number: 12/08/2020, 3:38 PM  Clinical Narrative:  Risk for readmission assessment completed. Prior to arrival patient was from home with family and caregiver support. Patient is active with [facility] for Hospice Services with Palliative Milrinone. Case Manager was able to speak with the son [name] and daughter [name]. Case Manager will follow for additional transition of care needs.                 Expected Discharge Plan: Home w Hospice Care Barriers to Discharge: Continued Medical Work up   Patient Goals and CMS Choice Patient states their goals for this hospitalization and ongoing recovery are:: to return home   Choice offered to / list presented to : NA (Active with [facility])  Expected Discharge Plan and Services Expected Discharge Plan: [place] In-house Referral: Hospice / Palliative Care Discharge Planning Services: CM Consult Post Acute Care Choice: Hospice Living arrangements for the past 2 months: Apartment (Pt lives in a condo.)                    [place] Arranged: RN [facility])    Prior Living Arrangements/Services Living arrangements for the past 2 months: Apartment (Pt lives in a condo.) Lives with:: Self (has continued support of her son) Patient language and need for interpreter reviewed:: Yes        Need for Family Participation in Patient Care: Yes (Comment) Care giver support system in place?: Yes (comment)   Criminal Activity/Legal Involvement Pertinent to Current Situation/Hospitalization: No - Comment as needed  Activities of Daily Living Home Assistive Devices/Equipment: Environmental consultant (specify type) ADL Screening (condition at time of admission) Patient's cognitive ability adequate to safely complete daily activities?: Yes Is  the patient deaf or have difficulty hearing?: No Does the patient have difficulty seeing, even when wearing glasses/contacts?: No Does the patient have difficulty concentrating, remembering, or making decisions?: Yes Patient able to express need for assistance with ADLs?: Yes Does the patient have difficulty dressing or bathing?: Yes Independently performs ADLs?: Yes (appropriate for developmental age) Does the patient have difficulty walking or climbing stairs?: Yes Weakness of Legs: Both Weakness of Arms/Hands: None  Permission Sought/Granted Permission sought to share information with : Case Manager,Family Chief Financial Officer       Permission granted to share info w AGENCY: [facility]        Emotional Assessment         Alcohol / Substance Use: Not Applicable Psych Involvement: No (comment)  Admission diagnosis:  Fall [W19.XXXA] Fracture of femoral neck, right, closed ([place]) [S72.001A] Fall, initial encounter [W19.XXXA] Closed displaced fracture of left femoral neck ([place]) [S72.002A] Closed displaced fracture of right femoral neck ([place]) [S72.001A] Patient Active Problem List   Diagnosis Date Noted  . Fracture of femoral neck, right, closed ([place]) 12/02/2020  . Fall at home, initial encounter 12/08/2020  . Acute kidney injury superimposed on CKD ([place]) 12/05/2020  . Hypothyroidism 11/11/2020  . Protein-calorie malnutrition, severe 10/28/2020  . Acute exacerbation of CHF (congestive heart failure) ([place]) 10/22/2020  . Acute on chronic combined systolic and diastolic CHF (congestive heart failure) ([place])   . Palliative care encounter   . Goals of care, counseling/discussion   . Encounter for hospice care discussion   . Acute on chronic systolic (  congestive) heart failure ([place]) 08/02/2020  . Malnutrition of moderate degree 02/03/2020  . DNR (do not resuscitate) discussion   . Palliative care by specialist   . UTI (urinary tract infection) 02/01/2020  .  Prolonged QT interval 02/01/2020  . Chronic anticoagulation 02/01/2020  . Transient hypotension 02/01/2020  . History of shingles 02/01/2020  . Near syncope 05/05/2019  . Hypercholesterolemia without hypertriglyceridemia 02/13/2018  . Syncope 01/21/2018  . Constipation 01/21/2018  . CHF exacerbation ([place]) 12/18/2017  . CKD (chronic kidney disease), stage III ([place]) 12/16/2017  . SOB (shortness of breath)   . Abdominal pain 09/18/2017  . CHF (congestive heart failure) ([place]) 09/17/2017  . Pulmonary edema   . Cardiac device in situ   . Ventricular tachycardia ([place])   . Cardiac arrest ([place])   . Severe mitral valve regurgitation   . Permanent atrial fibrillation ([place]) 09/03/2017  . Mitral regurgitation   . Acute on chronic systolic heart failure ([place])   . Non-ischemic cardiomyopathy ([place])   . CHF (congestive heart failure), NYHA class IV ([place]) 04/24/2016  . Dizziness 12/01/2015  . Hypertension 12/01/2015  . Insomnia 12/01/2015  . Chronic combined systolic and diastolic CHF (congestive heart failure) ([place]) 12/01/2015  . Paroxysmal atrial fibrillation (HCC)   . Cardiac arrhythmia 10/20/2014  . Systolic dysfunction 32/54/9826  . Hypoventilation, idiopathic 10/20/2014  . Weakness 10/20/2014  . Closed left hip fracture ([place]) 03/17/2013  . Cardiomegaly 03/17/2013  . Frequent PVCs 03/17/2013  . Hyperlipidemia 03/17/2013   PCP:  Lavone Orn, MD Pharmacy:   [facility] [place], [place] - [address] [place] [address] Phone: [phone] Fax: [phone]  [facility] [place], [place] - [address] [place] [address] Phone: [phone] Fax: [phone]     Social Determinants of Health (SDOH) Interventions    Readmission Risk Interventions Readmission Risk Prevention Plan 12/08/2020 10/26/2020  Transportation Screening Complete Complete  PCP or Specialist  Appt within 3-5 Days - Complete  HRI or [place] - Complete  Social Work Consult for [place] Planning/Counseling - Complete  Palliative Care Screening - Complete  Medication Review Press photographer) Complete Complete  PCP or Specialist appointment within 3-5 days of discharge Complete -  [place] or Home Care Consult Complete -  SW Recovery Care/Counseling Consult Complete -  Palliative Care Screening Complete -  [place] Not Applicable -  Some recent data might be hidden

## 2020-12-09 DIAGNOSIS — W19XXXA Unspecified fall, initial encounter: Secondary | ICD-10-CM | POA: Diagnosis not present

## 2020-12-09 DIAGNOSIS — Z7901 Long term (current) use of anticoagulants: Secondary | ICD-10-CM | POA: Diagnosis not present

## 2020-12-09 DIAGNOSIS — N179 Acute kidney failure, unspecified: Secondary | ICD-10-CM | POA: Diagnosis not present

## 2020-12-09 DIAGNOSIS — S72001D Fracture of unspecified part of neck of right femur, subsequent encounter for closed fracture with routine healing: Secondary | ICD-10-CM | POA: Diagnosis not present

## 2020-12-09 DIAGNOSIS — G9341 Metabolic encephalopathy: Secondary | ICD-10-CM

## 2020-12-09 LAB — PHOSPHORUS: Phosphorus: 4.1 mg/dL (ref 2.5–4.6)

## 2020-12-09 LAB — CBC
HCT: 32.9 % — ABNORMAL LOW (ref 36.0–46.0)
Hemoglobin: 10.1 g/dL — ABNORMAL LOW (ref 12.0–15.0)
MCH: 22.6 pg — ABNORMAL LOW (ref 26.0–34.0)
MCHC: 30.7 g/dL (ref 30.0–36.0)
MCV: 73.6 fL — ABNORMAL LOW (ref 80.0–100.0)
Platelets: 236 10*3/uL (ref 150–400)
RBC: 4.47 MIL/uL (ref 3.87–5.11)
RDW: 18.5 % — ABNORMAL HIGH (ref 11.5–15.5)
WBC: 5.7 10*3/uL (ref 4.0–10.5)
nRBC: 0 % (ref 0.0–0.2)

## 2020-12-09 LAB — COOXEMETRY PANEL
Carboxyhemoglobin: 1.3 % (ref 0.5–1.5)
Methemoglobin: 0.8 % (ref 0.0–1.5)
O2 Saturation: 39.7 %
Total hemoglobin: 10.4 g/dL — ABNORMAL LOW (ref 12.0–16.0)

## 2020-12-09 LAB — BASIC METABOLIC PANEL
Anion gap: 10 (ref 5–15)
BUN: 37 mg/dL — ABNORMAL HIGH (ref 8–23)
CO2: 27 mmol/L (ref 22–32)
Calcium: 8.9 mg/dL (ref 8.9–10.3)
Chloride: 99 mmol/L (ref 98–111)
Creatinine, Ser: 1.85 mg/dL — ABNORMAL HIGH (ref 0.44–1.00)
GFR, Estimated: 27 mL/min — ABNORMAL LOW (ref >60)
Glucose, Bld: 126 mg/dL — ABNORMAL HIGH (ref 70–99)
Potassium: 4.3 mmol/L (ref 3.5–5.1)
Sodium: 136 mmol/L (ref 135–145)

## 2020-12-09 LAB — MAGNESIUM: Magnesium: 2.3 mg/dL (ref 1.7–2.4)

## 2020-12-09 MED ORDER — RIVAROXABAN 15 MG PO TABS
15.0000 mg | ORAL_TABLET | Freq: Every day | ORAL | Status: DC
  Administered 2020-12-09 – 2020-12-11 (×3): 15 mg via ORAL
  Filled 2020-12-09 (×4): qty 1

## 2020-12-09 NOTE — Progress Notes (Signed)
Patient ID: Cheryl Vargas, female   DOB: [birth year], 86 y.o.   MRN: [id number]     Advanced Heart Failure Rounding Note  PCP-Cardiologist: [name], MD   Subjective:    Pleasantly confused, oriented to person.   She has mild hip pain currently, denies dyspnea.   Objective:   Weight Range: 56.3 kg Body mass index is 20.65 kg/m.   Vital Signs:   Temp:  [97.6 F (36.4 C)-97.9 F (36.6 C)] 97.6 F (36.4 C) (03/31 0500) Pulse Rate:  [79-81] 80 (03/31 0500) Resp:  [15-24] 19 (03/31 0500) BP: (108-132)/(65-83) 132/65 (03/31 0500) SpO2:  [90 %-97 %] 90 % (03/31 0500) Weight:  [56.3 kg] 56.3 kg (03/31 0500) Last BM Date: 12/05/20  Weight change: Filed Weights   12/01/2020 0319 12/08/20 0423 12/09/20 0500  Weight: 59.2 kg 57.1 kg 56.3 kg    Intake/Output:   Intake/Output Summary (Last 24 hours) at 12/09/2020 0738 Last data filed at 12/09/2020 0500 Gross per 24 hour  Intake 100 ml  Output 1850 ml  Net -1750 ml      Physical Exam    General: NAD Neck: JVP 8 cm, no thyromegaly or thyroid nodule.  Lungs: Clear to auscultation bilaterally with normal respiratory effort. CV: Nondisplaced PMI.  Heart regular S1/S2, no S3/S4, 2/6 HSM apex.  No peripheral edema.   Abdomen: Soft, nontender, no hepatosplenomegaly, no distention.  Skin: Intact without lesions or rashes.  Neurologic: Alert and oriented x 3.  Psych: Normal affect. Extremities: No clubbing or cyanosis.  HEENT: Normal.    Telemetry   Afib with BiV- pacing 80s (personally reviewed)  EKG    No new EKG to review   Labs    CBC Recent Labs    12/08/20 0555 12/09/20 0423  WBC 6.5 5.7  HGB 10.0* 10.1*  HCT 32.7* 32.9*  MCV 73.6* 73.6*  PLT 230 397   Basic Metabolic Panel Recent Labs    12/08/20 0555 12/09/20 0423  NA 134* 136  K 4.1 4.3  CL 96* 99  CO2 29 27  GLUCOSE 126* 126*  BUN 33* 37*  CREATININE 1.78* 1.85*  CALCIUM 8.6* 8.9  MG 2.2 2.3  PHOS  --  4.1   Liver Function Tests No  results for input(s): AST, ALT, ALKPHOS, BILITOT, PROT, ALBUMIN in the last 72 hours. No results for input(s): LIPASE, AMYLASE in the last 72 hours. Cardiac Enzymes No results for input(s): CKTOTAL, CKMB, CKMBINDEX, TROPONINI in the last 72 hours.  BNP: BNP (last 3 results) Recent Labs    10/22/20 1339 11/29/20 0913 11/30/2020 1021  BNP 1,503.8* 1,429.6* 1,866.0*    ProBNP (last 3 results) No results for input(s): PROBNP in the last 8760 hours.   D-Dimer No results for input(s): DDIMER in the last 72 hours. Hemoglobin A1C No results for input(s): HGBA1C in the last 72 hours. Fasting Lipid Panel No results for input(s): CHOL, HDL, LDLCALC, TRIG, CHOLHDL, LDLDIRECT in the last 72 hours. Thyroid Function Tests No results for input(s): TSH, T4TOTAL, T3FREE, THYROIDAB in the last 72 hours.  Invalid input(s): FREET3  Other results:   Imaging    No results found.   Medications:     Scheduled Medications: . amiodarone  200 mg Oral Daily  . Chlorhexidine Gluconate Cloth  6 each Topical Daily  . levothyroxine  50 mcg Oral Q0600  . magnesium oxide  200 mg Oral BID  . midodrine  10 mg Oral BID WC  . potassium chloride SA  40  mEq Oral BID  . rivaroxaban  15 mg Oral Daily  . rOPINIRole  0.5 mg Oral Q2000  . scopolamine  1 patch Transdermal Q72H  . senna-docusate  1 tablet Oral QHS  . torsemide  60 mg Oral Daily  . zolpidem  5 mg Oral QHS    Infusions: . milrinone 0.5 mcg/kg/min (12/08/20 2016)    PRN Medications: fluticasone, HYDROcodone-acetaminophen, loperamide, LORazepam, morphine injection, morphine CONCENTRATE, ondansetron, phenol, simethicone    Assessment/Plan   1. Displaced right femoral head fracture: Patient is unable to walk.  At baseline, she is getting hospice care and is on high dose IV milrinone infusion for palliation.  She would be very high risk for surgery with end stage CHF and baseline frailty, even on max milrinone her CO as measured by co-ox  is low (41% this morning).  She is already under hospice care. She will not walk again without surgery, but I suspect the chances of her having surgery and then walking any time soon would be extremely small. I think that it would be reasonable to forego surgery and with hospice house [facility]) placement vs SNF with hospice.  She will discuss with her children.  - Ongoing discussions with family and palliative care needed for decision making.  - PT/OT to see if she can get to chair.  2. Chronic Systolic Heart Failure:NICM,LHC 04/2016 showed no significant coronary disease. Cardiac MRI 10/17 EF 36%, diffuse HK, normal RV, biatrial enlargement, moderate MR, non-coronary LGE pattern involving the mid-wall of the basal to mid septum and inferior wall. Possibly prior myocarditis versus a form of infiltrative disease. Medtronic CRT-D s/p AV nodal ablation. Echo in 1/21 showed EF <20% with moderate LV dilation and mildly decreased RV systolic function. Echo in 12/21 with EF 20-25%, severe MR, severe TR, moderate AI.Low output on 1/21 RHC (CI 1.72) and 12/21 RHC (CI 1.8). End stage HF now with home milrinone.  Baseline NYHA class IIIb-IV symptoms, no change. Volume status looks better after IV Lasix yesterday. Co-ox still low today, but she is on maximum milrinone and no further options to improve this.  - Continue home torsemide 60 mg daily today (home regimen).  - Continue palliative milrinone 0.5 mcg/kg/min. Do not need to follow co-ox as will not be making changes.  -She is off losartan, bisoprololand spironolactone with symptomatic hypotension and now is on midodrine 10 mg bid. - Off digoxin with elevated level. 3. CKD stage 3: Creatinine stable 1.85 today.   4.Atrial fibrillation: Chronic, s/p AV nodal ablation and BiV pacing.  - Xarelto 15 mg daily restarted.  5. PVCs/VT: Frequent PVCs have limited BiV pacing. Not a good PVC ablation candidate per EP. Had VT in 1/21. Cannot tolerate  sotalol orTikosyn. Did not tolerate mexilitine with nausea. Had nausea also with ranolazine. She had nausea in the past with amiodarone but seems to be tolerating it now. -Continue amiodarone 200 mg daily for PVC suppression to try to promote BiV pacing. 6. Mitral regurgitation:Severe on 12/21 echo, not candidate for intervention. 7. Hypothyroidism:On Levoxyl. Per PCP. 8. Delirium: Suspect multifactorial with low output HF, hip fracture/immobility.   Length of Stay: 3  [name], MD  12/09/2020, 7:38 AM  Advanced Heart Failure Team Pager [phone] (M-F; 7a - 5p)  Please contact [facility] for night-coverage after hours (5p -7a ) and weekends on [email or web address]

## 2020-12-09 NOTE — Progress Notes (Signed)
PROGRESS NOTE    Cheryl Vargas  XNT:[id number] DOB: 1935-03-30 DOA: 11/26/2020 PCP: Lavone Orn, MD     Brief Narrative:  Cheryl Vargas is an 86 y.o.  female with past medical history of hypertension, hyperlipidemia, nonischemic cardiomyopathy, chronic systolic CHF with EF 96-75% on milrinone, permanent atrial fibrillation, severe mitral valve regurgitation, hypothyroidism, and CKD stage IIIb presented to hospital after sustaining a fall at home.  At baseline she uses a walker and has a Actuary.  Patient was recently hospitalized during  2/11-2/19 for acute on chronic systolic congestive heart failure exacerbation.  She required being placed on milrinone and was treated with IV Lasix.  Cardiology had evaluated and evaluated for milrinone at home, but suggested if condition deteriorated patient should be on hospice.  She subsequently presented back to the hospital on 3/21, with complaints of shortness of breath and had been given 80 mg of Lasix IV, and switch from p.o. Lasix to torsemide.  This time she was trying to get up off from the Referral in her right leg slipped and she fell hitting her head on the carpet.  CT scan of the head did not show any acute findings.  X-ray of the right femur showed impacted femoral neck fracture.   During hospitalization,patient was seen by cardiology and orthopedics.  Patient was determined to be high risk candidate for surgical intervention.  Conservative management was advised and patient was continued on pain management.  At this time, patient is awaiting for decision on going home with hospice versus hospice facility.  Assessment & Plan:   Principal Problem:   Fracture of femoral neck, right, closed ([place]) Active Problems:   Permanent atrial fibrillation (HCC)   Chronic anticoagulation   DNR (do not resuscitate) discussion   Fall at home, initial encounter   Acute kidney injury superimposed on CKD ([place])   Hypothyroidism  Closed fracture of right  femoral neck secondary to fall Seen by orthopedic surgery.  Patient is considered to be high risk for perioperative complication secondary to advanced heart failure on palliative milrinone.  Orthopedics recommended conservative treatment with nonweightbearing on the right lower extremity with transfer to chair if no surgery is undertaken. Pending physical therapy evaluation for mobilization out of bed to chair.   AKI on CKD stage 4 -Baseline Cr 1.27.  Creatinine today at 1.8.  Acute on chronic systolic congestive heart failure, Severe MR/TR  Cardiology on board.  Patient with end-stage heart failure.  Patient is on palliative milrinone at home.  Continue midodrine, torsemide as per cardiology.  Permanent atrial fibrillation on chronic anticoagulation. Status post AV nodal ablation and biventricular pacing.  Continue Xarelto, amiodarone  Hypothyroidism  Continue Synthroid  Anxiety Continue Ativan as needed.  Confusion possible metabolic encephalopathy.  On the background of narcotic Ativan.  We will try to minimize as possible.  Goals of care.  Patient is on hospice care with milrinone at home.  Overall prognosis is poor.  Patient is high risk for surgical intervention.  Pending PT evaluation to decide on further level of care.  Spoke with the transition of care and care team today regarding plan for disposition.  DVT prophylaxis:  SCDs Start: 12/08/2020 9163  Code Status: DNR  Family Communication:  I again spoke with the patient/son at bedside at length regarding the plan for disposition.  Disposition Plan:  Status is: Inpatient  Remains inpatient appropriate because:Ongoing active pain requiring inpatient pain management and Unsafe d/c plan  Dispo: The patient is from: Home  Anticipated d/c is to: SNF vs hospice, on milrinone drip.              Patient currently is not medically stable to d/c.   Difficult to place patient No   Consultants:   Orthopedic  surgery  Cardiology  Palliative care   Antimicrobials:  None  Subjective. Today, patient was seen and examined at bedside.  Appears to be confused at times.  Poor historian.  Patient son at bedside.  Has mild cough at times.  Complains of pain on moving the hip.  Objective: Vitals:   12/08/20 2019 12/08/20 2343 12/09/20 0500 12/09/20 0746  BP: 129/75 108/83 132/65 129/76  Pulse: 79 79 80 83  Resp: 15 (!) 24 19 20   Temp: 97.9 F (36.6 C) 97.7 F (36.5 C) 97.6 F (36.4 C) (!) 97.5 F (36.4 C)  TempSrc: Oral Oral Axillary Oral  SpO2: 94% 93% 90% 91%  Weight:   56.3 kg   Height:        Intake/Output Summary (Last 24 hours) at 12/09/2020 1129 Last data filed at 12/09/2020 0500 Gross per 24 hour  Intake 0 ml  Output 1850 ml  Net -1850 ml   Filed Weights   11/26/2020 0319 12/08/20 0423 12/09/20 0500  Weight: 59.2 kg 57.1 kg 56.3 kg    Physical examination:   General: Frail, elderly female in no acute distress, confused disoriented HENT:   No scleral pallor or icterus noted. Oral mucosa is dry.  Right internal jugular tunneled CVC.   Chest:    Diminished breath sounds bilaterally.  CVS: S1 &S2 heard. No murmur.  Regular rate and rhythm. Abdomen: Soft, nontender, nondistended.  Bowel sounds are heard.  External urinary catheter in place. Extremities: No cyanosis, clubbing or edema.  Peripheral pulses are palpable. Psych: Alert, awake and communicative but confused and disoriented. CNS:    Moves all extremities. Skin: Warm and dry.    Data Reviewed: I have personally reviewed following labs and imaging studies  CBC: Recent Labs  Lab 11/24/2020 0500 11/09/2020 0805 12/08/20 0555 12/09/20 0423  WBC 6.8 5.4 6.5 5.7  NEUTROABS 5.5  --   --   --   HGB 10.4* 9.5* 10.0* 10.1*  HCT 33.1* 31.3* 32.7* 32.9*  MCV 73.2* 73.6* 73.6* 73.6*  PLT 225 207 230 536   Basic Metabolic Panel: Recent Labs  Lab 11/14/2020 0500 11/24/2020 0805 12/08/20 0555 12/09/20 0423  NA 132* 136  134* 136  K 3.7 3.8 4.1 4.3  CL 97* 98 96* 99  CO2 26 31 29 27   GLUCOSE 126* 112* 126* 126*  BUN 33* 31* 33* 37*  CREATININE 1.70* 1.67* 1.78* 1.85*  CALCIUM 8.5* 8.6* 8.6* 8.9  MG  --   --  2.2 2.3  PHOS  --   --   --  4.1   GFR: Estimated Creatinine Clearance: 20.5 mL/min (A) (by C-G formula based on SCr of 1.85 mg/dL (H)). Liver Function Tests: No results for input(s): AST, ALT, ALKPHOS, BILITOT, PROT, ALBUMIN in the last 168 hours. No results for input(s): LIPASE, AMYLASE in the last 168 hours. No results for input(s): AMMONIA in the last 168 hours. Coagulation Profile: Recent Labs  Lab 11/16/2020 0525  INR 1.7*   Cardiac Enzymes: No results for input(s): CKTOTAL, CKMB, CKMBINDEX, TROPONINI in the last 168 hours. BNP (last 3 results) No results for input(s): PROBNP in the last 8760 hours. HbA1C: No results for input(s): HGBA1C in the last 72 hours. CBG: No results  for input(s): GLUCAP in the last 168 hours. Lipid Profile: No results for input(s): CHOL, HDL, LDLCALC, TRIG, CHOLHDL, LDLDIRECT in the last 72 hours. Thyroid Function Tests: No results for input(s): TSH, T4TOTAL, FREET4, T3FREE, THYROIDAB in the last 72 hours. Anemia Panel: No results for input(s): VITAMINB12, FOLATE, FERRITIN, TIBC, IRON, RETICCTPCT in the last 72 hours. Sepsis Labs: No results for input(s): PROCALCITON, LATICACIDVEN in the last 168 hours.  Recent Results (from the past 240 hour(s))  Resp Panel by RT-PCR (Flu A&B, Covid) Nasopharyngeal Swab     Status: None   Collection Time: 11/13/2020  5:00 AM   Specimen: Nasopharyngeal Swab; Nasopharyngeal(NP) swabs in vial transport medium  Result Value Ref Range Status   SARS Coronavirus 2 by RT PCR NEGATIVE NEGATIVE Final    Comment: (NOTE) SARS-CoV-2 target nucleic acids are NOT DETECTED.  The SARS-CoV-2 RNA is generally detectable in upper respiratory specimens during the acute phase of infection. The lowest concentration of SARS-CoV-2 viral  copies this assay can detect is 138 copies/mL. A negative result does not preclude SARS-Cov-2 infection and should not be used as the sole basis for treatment or other patient management decisions. A negative result may occur with  improper specimen collection/handling, submission of specimen other than nasopharyngeal swab, presence of viral mutation(s) within the areas targeted by this assay, and inadequate number of viral copies(<138 copies/mL). A negative result must be combined with clinical observations, patient history, and epidemiological information. The expected result is Negative.  Fact Sheet for Patients:  EntrepreneurPulse.com.au  Fact Sheet for Healthcare Providers:  IncredibleEmployment.be  This test is no t yet approved or cleared by the [place] FDA and  has been authorized for detection and/or diagnosis of SARS-CoV-2 by FDA under an Emergency Use Authorization (EUA). This EUA will remain  in effect (meaning this test can be used) for the duration of the COVID-19 declaration under Section 564(b)(1) of the Act, 21 U.S.C.section 360bbb-3(b)(1), unless the authorization is terminated  or revoked sooner.       Influenza A by PCR NEGATIVE NEGATIVE Final   Influenza B by PCR NEGATIVE NEGATIVE Final    Comment: (NOTE) The Xpert Xpress SARS-CoV-2/FLU/RSV plus assay is intended as an aid in the diagnosis of influenza from Nasopharyngeal swab specimens and should not be used as a sole basis for treatment. Nasal washings and aspirates are unacceptable for Xpert Xpress SARS-CoV-2/FLU/RSV testing.  Fact Sheet for Patients: EntrepreneurPulse.com.au  Fact Sheet for Healthcare Providers: IncredibleEmployment.be  This test is not yet approved or cleared by the [place] FDA and has been authorized for detection and/or diagnosis of SARS-CoV-2 by FDA under an Emergency Use Authorization (EUA). This  EUA will remain in effect (meaning this test can be used) for the duration of the COVID-19 declaration under Section 564(b)(1) of the Act, 21 U.S.C. section 360bbb-3(b)(1), unless the authorization is terminated or revoked.  Performed at [facility], [place] [address] [place],  [address]       Radiology Studies: No results found.   Scheduled Meds: . amiodarone  200 mg Oral Daily  . Chlorhexidine Gluconate Cloth  6 each Topical Daily  . levothyroxine  50 mcg Oral Q0600  . magnesium oxide  200 mg Oral BID  . midodrine  10 mg Oral BID WC  . potassium chloride SA  40 mEq Oral BID  . rivaroxaban  15 mg Oral Daily  . rOPINIRole  0.5 mg Oral Q2000  . scopolamine  1 patch Transdermal Q72H  .  senna-docusate  1 tablet Oral QHS  . torsemide  60 mg Oral Daily  . zolpidem  5 mg Oral QHS   Continuous Infusions: . milrinone 0.5 mcg/kg/min (12/09/20 0754)     LOS: 3 days   Flora Lipps, MD [facility] 12/09/2020, 11:29 AM

## 2020-12-09 NOTE — Progress Notes (Signed)
PT Cancellation Note  Patient Details Name: Cheryl Vargas MRN: [id number] DOB: 1934-12-26   Cancelled Treatment:    Reason Eval/Treat Not Completed: Patient declined, no reason specified. Pt's spouse declines PT evaluation at this time, reports the pt has been very restless and recently received ativan, beginning to calm down. Pt's spouse reports he is a Community education officer and has sufficient knowledge to perform transfers with the patient. Spouse reports patient and family are likely to decline PT evaluation during this admission. PT will continue to follow at this time, if patient and family continue to decline then PT will sign off.   Zenaida Niece 12/09/2020, 2:13 PM

## 2020-12-10 ENCOUNTER — Other Ambulatory Visit (HOSPITAL_COMMUNITY): Payer: Self-pay | Admitting: Cardiology

## 2020-12-10 ENCOUNTER — Other Ambulatory Visit (HOSPITAL_COMMUNITY): Payer: Self-pay | Admitting: Internal Medicine

## 2020-12-10 DIAGNOSIS — I5022 Chronic systolic (congestive) heart failure: Secondary | ICD-10-CM | POA: Diagnosis not present

## 2020-12-10 DIAGNOSIS — S72001D Fracture of unspecified part of neck of right femur, subsequent encounter for closed fracture with routine healing: Secondary | ICD-10-CM | POA: Diagnosis not present

## 2020-12-10 DIAGNOSIS — W19XXXA Unspecified fall, initial encounter: Secondary | ICD-10-CM | POA: Diagnosis not present

## 2020-12-10 DIAGNOSIS — N179 Acute kidney failure, unspecified: Secondary | ICD-10-CM | POA: Diagnosis not present

## 2020-12-10 DIAGNOSIS — Z7901 Long term (current) use of anticoagulants: Secondary | ICD-10-CM | POA: Diagnosis not present

## 2020-12-10 LAB — BASIC METABOLIC PANEL
Anion gap: 10 (ref 5–15)
BUN: 42 mg/dL — ABNORMAL HIGH (ref 8–23)
CO2: 29 mmol/L (ref 22–32)
Calcium: 8.9 mg/dL (ref 8.9–10.3)
Chloride: 101 mmol/L (ref 98–111)
Creatinine, Ser: 2.01 mg/dL — ABNORMAL HIGH (ref 0.44–1.00)
GFR, Estimated: 24 mL/min — ABNORMAL LOW (ref >60)
Glucose, Bld: 93 mg/dL (ref 70–99)
Potassium: 4.2 mmol/L (ref 3.5–5.1)
Sodium: 140 mmol/L (ref 135–145)

## 2020-12-10 MED ORDER — TORSEMIDE 20 MG PO TABS
40.0000 mg | ORAL_TABLET | Freq: Every day | ORAL | Status: DC
  Filled 2020-12-10: qty 2

## 2020-12-10 MED ORDER — ALTEPLASE 2 MG IJ SOLR
2.0000 mg | Freq: Once | INTRAMUSCULAR | Status: AC
  Administered 2020-12-10: 2 mg
  Filled 2020-12-10: qty 2

## 2020-12-10 NOTE — Evaluation (Signed)
Physical Therapy Evaluation Patient Details Name: Cheryl Vargas MRN: [id number] DOB: [birth year] Today's Date: 12/10/2020   History of Present Illness  86 y.o. female presenting to [facility] ED on 12/07/2020 s/p fall resulting in R femoral neck fx. At baseline pt with chronic systolic CHF, with recent admission in February and March. Due to extent of Heart failure surgery to repair fx would be high risk per MD. PMH includes hypertension, hyperlipidemia, nonischemic cardiomyopathy, chronic systolic CHF with EF 17-61% on milrinone, permanent atrial fibrillation, severe mitral valve regurgitation, hypothyroidism, and CKD stage IIIb.  Clinical Impression  Pt presents to PT with deficits in cognition, functional mobility, balance, strength, endurance, and with significant pain in R hip which limits mobility. Pt requires significant assistance for all bed mobility and to maintain sitting balance. Pt demonstrates significant alteration of cognition, often maintaining eyes closed during session and demonstrating garbled and tangential speech. Pt is unable to tolerate attempts at standing due to pain. Pt will benefit from continued acute PT POC to improve mobility quality and to reduce falls risk. PT recommends SNF placement due to physical assistance needs and cognitive deficits which place the patient at a high falls risk. If patient/family decline SNF or pt is unable to find placement, the patient will then benefit from 24/7 assistance as well as a hospital bed and hoyer lift in the home.    Follow Up Recommendations SNF (if discharging home the patient will need 24/7 assistance, may benefit from [facility] bed;Other (comment) (hoyer lift, if discharging home)    Recommendations for Other Services       Precautions / Restrictions Precautions Precautions: Fall Precaution Comments: family reports multiple falls Restrictions Weight Bearing Restrictions: Yes RLE Weight  Bearing: Non weight bearing      Mobility  Bed Mobility Overal bed mobility: Needs Assistance Bed Mobility: Supine to Sit;Sit to Supine   Sidelying to sit: Total assist;+2 for physical assistance Supine to sit: Max assist;+2 for physical assistance;HOB elevated Sit to supine: Total assist;+2 for physical assistance        Transfers                 General transfer comment: attempted to initiate 2 sit to stand transfers however pt screaming in pain prior to clearing buttocks, OT assisting in elevating RLE off floor to maintain NWB. Further transfer attempts deferred due to pain  Ambulation/Gait                Stairs            Wheelchair Mobility    Modified Rankin (Stroke Patients Only)       Balance Overall balance assessment: Needs assistance Sitting-balance support: No upper extremity supported;Feet supported;Single extremity supported;Bilateral upper extremity supported Sitting balance-Leahy Scale: Poor Sitting balance - Comments: mod-maxA, posterior lean Postural control: Posterior lean                                   Pertinent Vitals/Pain Pain Assessment: Faces Faces Pain Scale: Hurts whole lot Pain Location: R hip with mobility Pain Descriptors / Indicators: Grimacing;Moaning Pain Intervention(s): Limited activity within patient's tolerance    Home Living Family/patient expects to be discharged to:: Private residence Living Arrangements: Other relatives Available Help at Discharge: Family;Available 24 hours/day (per neighbor family recently presenty 24/7, taking shifts caring for the patient) Type of Home: Apartment Home Access: Level entry  Home Layout: One level Home Equipment: Shower seat;Hand held Tourist information centre manager - 4 wheels;Toilet riser;Grab bars - toilet;Walker - 2 wheels;Cane - single point;Wheelchair - manual Additional Comments: home setup obtained from recent prior admission    Prior Function Level of  Independence: Needs assistance   Gait / Transfers Assistance Needed: pt was ambulating with rollator in mid-february, unable to determine recent baseline as no family present currently  ADL's / Homemaking Assistance Needed: no family present during eval, independent in self care and needing assist for ADLs in february  Comments: Pt unable to give report on prior level of function. Family was contacted and reported that she has been requiring assistance from caregivers and family daily.     Hand Dominance   Dominant Hand: Right    Extremity/Trunk Assessment   Upper Extremity Assessment Upper Extremity Assessment: Defer to OT evaluation    Lower Extremity Assessment Lower Extremity Assessment: RLE deficits/detail;LLE deficits/detail RLE Deficits / Details: significant pain with all RLE movement, ROM appears [facility] although formal assessment is limited. LLE Deficits / Details: at least 3/5 based on observed mobility, formal strength assessment deferred 2/2 AMS    Cervical / Trunk Assessment Cervical / Trunk Assessment: Kyphotic  Communication   Communication:  (garbled and tangential speech)  Cognition Arousal/Alertness: Lethargic (pt with eyes closed for majority of session unless cued to open, does follow commands intermittently and responds readily to verbal cues) Behavior During Therapy: Impulsive Overall Cognitive Status: Impaired/Different from baseline Area of Impairment: Orientation;Attention;Memory;Following commands;Safety/judgement;Awareness;Problem solving                 Orientation Level: Disoriented to;Time Current Attention Level: Focused Memory: Decreased recall of precautions;Decreased short-term memory Following Commands: Follows one step commands with increased time Safety/Judgement: Decreased awareness of safety;Decreased awareness of deficits Awareness: Intellectual Problem Solving: Slow processing;Difficulty sequencing;Requires verbal cues;Requires  tactile cues;Decreased initiation General Comments: pt is aware of hip fx      General Comments General comments (skin integrity, edema, etc.): VSS on 2L [place]    Exercises     Assessment/Plan    PT Assessment Patient needs continued PT services  PT Problem List Decreased strength;Decreased activity tolerance;Decreased balance;Decreased mobility;Decreased cognition;Decreased knowledge of use of DME;Decreased safety awareness;Decreased knowledge of precautions;Pain       PT Treatment Interventions DME instruction;Functional mobility training;Therapeutic activities;Therapeutic exercise;Balance training;Patient/family education;Cognitive remediation;Neuromuscular re-education    PT Goals (Current goals can be found in the Care Plan section)  Acute Rehab PT Goals Patient Stated Goal: pt does not state, PT goal to reduce pain with mobility and improve sitting balance PT Goal Formulation: Patient unable to participate in goal setting Time For Goal Achievement: 12/24/20 Potential to Achieve Goals: Fair    Frequency Min 2X/week   Barriers to discharge        Co-evaluation PT/OT/SLP Co-Evaluation/Treatment: Yes Reason for Co-Treatment: Complexity of the patient's impairments (multi-system involvement);Necessary to address cognition/behavior during functional activity;For patient/therapist safety;To address functional/ADL transfers PT goals addressed during session: Mobility/safety with mobility;Balance;Strengthening/ROM OT goals addressed during session: Strengthening/ROM;Other (comment) (sitting EOB in prep for seated ADL's)       AM-PAC PT "6 Clicks" Mobility  Outcome Measure Help needed turning from your back to your side while in a flat bed without using bedrails?: A Lot Help needed moving from lying on your back to sitting on the side of a flat bed without using bedrails?: A Lot Help needed moving to and from a bed to a chair (including a wheelchair)?: Total Help needed standing  up from a chair using your arms (e.g., wheelchair or bedside chair)?: Total Help needed to walk in hospital room?: Total Help needed climbing 3-5 steps with a railing? : Total 6 Click Score: 8    End of Session   Activity Tolerance: Patient limited by pain Patient left: in bed;with call bell/phone within reach;with bed alarm set;with family/visitor present Nurse Communication: Mobility status;Need for lift equipment PT Visit Diagnosis: Other abnormalities of gait and mobility (R26.89);Muscle weakness (generalized) (M62.81);Pain Pain - Right/Left: Right Pain - part of body: Hip    Time: 1333-1401 PT Time Calculation (min) (ACUTE ONLY): 28 min   Charges:   PT Evaluation $PT Eval Moderate Complexity: 1 Mod          Zenaida Niece, PT, DPT Acute Rehabilitation Pager: [phone]   Zenaida Niece 12/10/2020, 2:32 PM

## 2020-12-10 NOTE — Evaluation (Signed)
Occupational Therapy Evaluation Patient Details Name: Cheryl Vargas MRN: [id number] DOB: 1935-02-16 Today's Date: 12/10/2020    History of Present Illness Pt is a 86 y.o. female with medical history of nonischemic cardiomyopathy, chronic systolic congestive heart failure with ejection fraction of 20 to 25%, proximal A. fib, hyperlipidemia, hypertension, severe mitral valve regurgitation, CKD stage IIIb, L TKA. PT admitted to ED after a fall resulting in a R femur fracture.   Clinical Impression   Pt admitted to the ED due to the reasons listed above. Pt appeared very confused and restless upon entry and demonstrated difficulty keeping her eyes open and talking to therapists. Throughout the evaluation pt required max verbal cueing to open her eyes and to maintain attention to the task due to lethargy and pain. Pt requires +2 assistance during all functional mobility due to weakness, pain, and confusion. Pt family was not present during evaluation, so prior level of function and house arrangement information is limited. Additionally pt was unable to tolerate standing due to pain and while sitting EOB was unable to maintain midline without assist. Pt family is discussing discharge option at this time. Pt will benefit from continued OT services to address the follow concerns listed below.     Follow Up Recommendations  Supervision/Assistance - 24 hour;Other (comment) (TBD- Pt's family making decisions on pt's care once d/c'd)    Equipment Recommendations  Other (comment) (TBD based on d/c plan)    Recommendations for Other Services Speech consult     Precautions / Restrictions Precautions Precautions: Fall;Other (comment) (R femur fracture) Precaution Comments: family reports multiple falls Restrictions Weight Bearing Restrictions: Yes RLE Weight Bearing: Non weight bearing      Mobility Bed Mobility Overal bed mobility: Needs Assistance Bed Mobility: Sidelying to Sit;Sit to Supine    Sidelying to sit: Max assist;+2 for physical assistance Supine to sit: Max assist;+2 for physical assistance;HOB elevated Sit to supine: Total assist;+2 for physical assistance   General bed mobility comments: Pt unable to maintain sitting due to pain, additionally pt unable to stand due to pain.    Transfers Overall transfer level: Needs assistance               General transfer comment: Attempted to initiate 2 sit to stand transfers however pt screaming in pain prior to clearing buttocks, OT assisting in elevating RLE off floor to maintain NWB. Further transfer attempts deferred due to pain    Balance Overall balance assessment: Needs assistance Sitting-balance support: Single extremity supported;Bilateral upper extremity supported;No upper extremity supported;Feet supported Sitting balance-Leahy Scale: Poor Sitting balance - Comments: Mod-Max A with PT supporting posture posteriorly Postural control: Posterior lean;Left lateral lean                                 ADL either performed or assessed with clinical judgement   ADL Overall ADL's : Needs assistance/impaired Eating/Feeding: Maximal assistance;Cueing for safety;Cueing for sequencing;Sitting Eating/Feeding Details (indicate cue type and reason): Pt unable to sip water through a straw, when OT held cup up for pt she was able to assist with tipping cup to sip water. When provided with a small bite of mac n cheese, pt was unable to chew the food, she just held it in her mouth and spit it in a napkin. Grooming: Wash/dry face;Minimal assistance;Cueing for sequencing;Sitting Grooming Details (indicate cue type and reason): Pt required min A to bring her hands to her face  to wash her face.             Lower Body Dressing: Total assistance Lower Body Dressing Details (indicate cue type and reason): Pt unable to bend or bring feet into figure 4 position to don and doff socks.             Functional  mobility during ADLs: Total assistance;Maximal assistance General ADL Comments: Pt required max A x2 to sit EOB and maintain upright posture to prepare for ADL's and was unable to stand with total assist x2 due to pain and weakness.     Vision   Additional Comments: Pt required max cueing multiple times throughout the session to open her eyes and look around the room or look at an object. With max prompting pt was able to look across the room at her neighbor and told OT her name.     Perception     Praxis      Pertinent Vitals/Pain Pain Assessment: Faces Faces Pain Scale: Hurts whole lot Pain Location: R hip Pain Descriptors / Indicators: Grimacing;Stabbing;Sharp;Other (Comment) (Pt hollars out with any movement of her RLE) Pain Intervention(s): Monitored during session;Limited activity within patient's tolerance;Repositioned;Other (comment) (OT/PT informed nursing on pt pain)     Hand Dominance Right   Extremity/Trunk Assessment Upper Extremity Assessment Upper Extremity Assessment: Difficult to assess due to impaired cognition   Lower Extremity Assessment Lower Extremity Assessment: Defer to PT evaluation RLE Deficits / Details: significant pain with all RLE movement, ROM appears [facility] although formal assessment is limited. LLE Deficits / Details: at least 3/5 based on observed mobility, formal strength assessment deferred 2/2 AMS   Cervical / Trunk Assessment Cervical / Trunk Assessment: Kyphotic   Communication Communication Communication: Expressive difficulties;Receptive difficulties   Cognition Arousal/Alertness: Lethargic Behavior During Therapy: Restless Overall Cognitive Status: Impaired/Different from baseline Area of Impairment: Attention;Memory;Following commands;Safety/judgement;Awareness;Orientation                 Orientation Level: Disoriented to;Place;Time;Situation Current Attention Level: Alternating Memory: Decreased short-term memory Following  Commands: Follows one step commands inconsistently Safety/Judgement: Decreased awareness of safety Awareness: Emergent Problem Solving: Slow processing;Difficulty sequencing;Requires verbal cues;Requires tactile cues;Decreased initiation General Comments: Pt reports "I can't sit but I can walk", throughout the session pt is unable to maintain attention on tasks physically or verbally. Pt demonstrating difficulty with following directions to sit EOB.   General Comments  VSS on [place]    Exercises     Shoulder Instructions      Home Living Family/patient expects to be discharged to:: Unsure Living Arrangements: Alone;Other (Comment) (caregivers/sitters in intermittently.) Available Help at Discharge: Other (Comment) (TBD, pt family deciding best plan of care after discharge for pt.) Type of Home: Apartment Home Access: Level entry     Home Layout: One level     Bathroom Shower/Tub: Occupational psychologist: Handicapped height     Home Equipment: Shower seat;Hand held Tourist information centre manager - 4 wheels;Toilet riser;Grab bars - toilet;Walker - 2 wheels;Cane - single point;Wheelchair - manual   Additional Comments: Pt demonstrating confusion and delirium at the time of the evaluation. Unable to get home set up from pt. Neighbor in the room at time of eval and she is unsure of pt home set up.      Prior Functioning/Environment Level of Independence: Needs assistance  Gait / Transfers Assistance Needed: walks with rollator ADL's / Homemaking Assistance Needed: no family present during eval, independent in self care and needing assist for  ADLs in february   Comments: Pt unable to give report on prior level of function. Family was contacted and reported that she has been requiring assistance from caregivers and family daily.        OT Problem List: Decreased strength;Decreased activity tolerance;Impaired balance (sitting and/or standing);Decreased cognition;Decreased safety  awareness;Decreased knowledge of use of DME or AE;Pain;Impaired UE functional use      OT Treatment/Interventions: Self-care/ADL training;Therapeutic exercise;Energy conservation;DME and/or AE instruction;Therapeutic activities;Cognitive remediation/compensation;Patient/family education;Balance training    OT Goals(Current goals can be found in the care plan section) Acute Rehab OT Goals Patient Stated Goal: Pt unable to state goal. (OT goal to reduce pain and increase mobility for self care) OT Goal Formulation: Patient unable to participate in goal setting Time For Goal Achievement: 12/24/20 Potential to Achieve Goals: Fair ADL Goals Pt Will Perform Eating: with supervision;sitting Pt Will Perform Grooming: with supervision;sitting Additional ADL Goal #1: Pt will tolerate sitting EOB for 5 mins to prepare for seated self care tasks.  OT Frequency: Min 2X/week   Barriers to D/C: Decreased caregiver support  Pt's family is discussing how to procede with patients care once discharged.       Co-evaluation PT/OT/SLP Co-Evaluation/Treatment: Yes Reason for Co-Treatment: Complexity of the patient's impairments (multi-system involvement);Necessary to address cognition/behavior during functional activity;For patient/therapist safety;To address functional/ADL transfers PT goals addressed during session: Mobility/safety with mobility;Balance;Strengthening/ROM OT goals addressed during session: Strengthening/ROM;Other (comment) (sitting EOB in prep for seated ADL's)      AM-PAC OT "6 Clicks" Daily Activity     Outcome Measure Help from another person eating meals?: A Lot Help from another person taking care of personal grooming?: A Lot Help from another person toileting, which includes using toliet, bedpan, or urinal?: Total Help from another person bathing (including washing, rinsing, drying)?: Total Help from another person to put on and taking off regular upper body clothing?: A Lot Help  from another person to put on and taking off regular lower body clothing?: Total 6 Click Score: 9   End of Session Equipment Utilized During Treatment: Oxygen Nurse Communication: Mobility status;Patient requests pain meds  Activity Tolerance: Patient limited by pain;Patient limited by lethargy Patient left: in bed;with call bell/phone within reach;with bed alarm set;with family/visitor present  OT Visit Diagnosis: Muscle weakness (generalized) (M62.81);History of falling (Z91.81);Pain Pain - Right/Left: Right Pain - part of body: Hip                Time: 8550-1586 OT Time Calculation (min): 29 min Charges:  OT General Charges $OT Visit: 1 Visit OT Evaluation $OT Eval Moderate Complexity: 1 Mod  Haeli Gerlich H., OTR/L Acute Rehabilitation  Reda Citron Elane Neela Zecca 12/10/2020, 2:50 PM

## 2020-12-10 NOTE — Progress Notes (Signed)
PROGRESS NOTE    Cheryl Vargas  GBT:[id number] DOB: 10-17-1934 DOA: 12/07/2020 PCP: Lavone Orn, MD     Brief Narrative:   Cheryl Vargas is an 86 y.o.  female with past medical history of hypertension, hyperlipidemia, nonischemic cardiomyopathy, chronic systolic CHF with EF 71-06% on milrinone, permanent atrial fibrillation, severe mitral valve regurgitation, hypothyroidism, and CKD stage IIIb presented to hospital after sustaining a fall at home.  At baseline she uses a walker and has a Actuary.  Patient was recently hospitalized during  2/11-2/19 for acute on chronic systolic congestive heart failure exacerbation.  She required being placed on milrinone and was treated with IV Lasix.  Cardiology had evaluated and evaluated for milrinone at home, but suggested if condition deteriorated patient should be on hospice.  She subsequently presented back to the hospital on 3/21, with complaints of shortness of breath and had been given 80 mg of Lasix IV, and switch from p.o. Lasix to torsemide.  This time she was trying to get up off from the Referral in her right leg slipped and she fell hitting her head on the carpet.  CT scan of the head did not show any acute findings.  X-ray of the right femur showed impacted femoral neck fracture.   During hospitalization,patient was seen by cardiology and orthopedics.  Patient was determined to be high risk candidate for surgical intervention.  Conservative management was advised and patient was continued on pain management.  At this time, patient is awaiting for decision on going home with hospice versus hospice facility/SNF with hospice.  Assessment & Plan:   Principal Problem:   Fracture of femoral neck, right, closed ([place]) Active Problems:   Permanent atrial fibrillation (HCC)   Chronic anticoagulation   DNR (do not resuscitate) discussion   Fall at home, initial encounter   Acute kidney injury superimposed on CKD ([place])   Hypothyroidism  Closed  fracture of right femoral neck secondary to fall Seen by orthopedic surgery.  Patient is considered to be high risk for perioperative complication secondary to advanced heart failure on palliative milrinone.  Orthopedics recommended conservative treatment with nonweightbearing on the right lower extremity with transfer to chair if no surgery is undertaken. Physical therapy evaluation recommended skilled nursing facility placement.  I have spoken with the transition of care team regarding appropriate disposition of this patient   AKI on CKD stage 4 -Baseline Cr 1.27.  Creatinine today at 2.01 and trending up.  Acute on chronic systolic congestive heart failure, Severe MR/TR  Cardiology on board.  Patient with end-stage heart failure.  Patient is on palliative milrinone at home.  Continue midodrine, torsemide as per cardiology.  Not a good prognosis is the patient is already confused and disoriented with worsening renal failure.  Patient's family is aware of the poor prognosis.   Permanent atrial fibrillation on chronic anticoagulation. Status post AV nodal ablation and biventricular pacing.  Continue Xarelto, amiodarone  Hypothyroidism  Continue Synthroid  Anxiety Continue Ativan as needed.  Confusion, possible metabolic encephalopathy.  With worsening heart failure renal failure and being on narcotics.  Prognostic sign.  Goals of care.    Overall prognosis is poor.  Patient with significant functional and mental decline.  Patient is high risk for surgical intervention.  Spoke with the transition of care and care team today regarding plan for disposition.  PT now recommending skilled nursing facility placement.  DVT prophylaxis:  SCDs Start: 11/21/2020 2694  Code Status: DNR  Family Communication:  None today.  I  spoke with the patient/son at bedside at length regarding the poor clinical condition of the patient yesterday.  Patient's son stated that he was not sure what to do and was  going to discuss with the rest of the family.  He is unsure about whether to go to hospice facility versus home with hospice.  Disposition Plan:  Status is: Inpatient  Remains inpatient appropriate because:Ongoing active pain requiring inpatient pain management and Unsafe d/c plan  Dispo: The patient is from: Home              Anticipated d/c is to: SNF vs hospice, on milrinone drip.  Unsure disposition.  Communicated with transition of care, transition of care on board              Patient currently is not medically stable to d/c.   Difficult to place patient No   Consultants:   Orthopedic surgery  Cardiology  Palliative care   Antimicrobials:  None  Subjective. Today, patient was seen and examined at bedside.  Patient appears to be confused disoriented.    Objective: Vitals:   12/10/20 0038 12/10/20 0500 12/10/20 0653 12/10/20 1119  BP:  103/72  118/70  Pulse: 80 80  85  Resp: 18 18  18   Temp: 97.7 F (36.5 C) 97.7 F (36.5 C)  (!) 97.3 F (36.3 C)  TempSrc: Axillary Axillary  Axillary  SpO2: 91% 97%  98%  Weight:   55.1 kg   Height:        Intake/Output Summary (Last 24 hours) at 12/10/2020 1455 Last data filed at 12/10/2020 1442 Gross per 24 hour  Intake 711.48 ml  Output 700 ml  Net 11.48 ml   Filed Weights   12/08/20 0423 12/09/20 0500 12/10/20 0653  Weight: 57.1 kg 56.3 kg 55.1 kg    Physical examination:  General: Frail, elderly female confused disoriented HENT:   No scleral pallor or icterus noted. Oral mucosa is dry.  Right internal jugular tunneled CVC in place..   Chest:    Diminished breath sounds bilaterally.  CVS: S1 &S2 heard. No murmur.  Regular rate and rhythm. Abdomen: Soft, nontender, nondistended.  Bowel sounds are heard.  External urinary catheter in place. Extremities: No cyanosis, clubbing or edema.  Peripheral pulses are palpable. Psych:  confused and disoriented. CNS:    Moves all extremities. Skin: Warm and dry.    Data  Reviewed: I have personally reviewed following labs and imaging studies  CBC: Recent Labs  Lab 11/23/2020 0500 11/18/2020 0805 12/08/20 0555 12/09/20 0423  WBC 6.8 5.4 6.5 5.7  NEUTROABS 5.5  --   --   --   HGB 10.4* 9.5* 10.0* 10.1*  HCT 33.1* 31.3* 32.7* 32.9*  MCV 73.2* 73.6* 73.6* 73.6*  PLT 225 207 230 923   Basic Metabolic Panel: Recent Labs  Lab 11/20/2020 0500 11/25/2020 0805 12/08/20 0555 12/09/20 0423 12/10/20 0641  NA 132* 136 134* 136 140  K 3.7 3.8 4.1 4.3 4.2  CL 97* 98 96* 99 101  CO2 26 31 29 27 29   GLUCOSE 126* 112* 126* 126* 93  BUN 33* 31* 33* 37* 42*  CREATININE 1.70* 1.67* 1.78* 1.85* 2.01*  CALCIUM 8.5* 8.6* 8.6* 8.9 8.9  MG  --   --  2.2 2.3  --   PHOS  --   --   --  4.1  --    GFR: Estimated Creatinine Clearance: 18.4 mL/min (A) (by C-G formula based on SCr of  2.01 mg/dL (H)). Liver Function Tests: No results for input(s): AST, ALT, ALKPHOS, BILITOT, PROT, ALBUMIN in the last 168 hours. No results for input(s): LIPASE, AMYLASE in the last 168 hours. No results for input(s): AMMONIA in the last 168 hours. Coagulation Profile: Recent Labs  Lab 11/24/2020 0525  INR 1.7*   Cardiac Enzymes: No results for input(s): CKTOTAL, CKMB, CKMBINDEX, TROPONINI in the last 168 hours. BNP (last 3 results) No results for input(s): PROBNP in the last 8760 hours. HbA1C: No results for input(s): HGBA1C in the last 72 hours. CBG: No results for input(s): GLUCAP in the last 168 hours. Lipid Profile: No results for input(s): CHOL, HDL, LDLCALC, TRIG, CHOLHDL, LDLDIRECT in the last 72 hours. Thyroid Function Tests: No results for input(s): TSH, T4TOTAL, FREET4, T3FREE, THYROIDAB in the last 72 hours. Anemia Panel: No results for input(s): VITAMINB12, FOLATE, FERRITIN, TIBC, IRON, RETICCTPCT in the last 72 hours. Sepsis Labs: No results for input(s): PROCALCITON, LATICACIDVEN in the last 168 hours.  Recent Results (from the past 240 hour(s))  Resp Panel by RT-PCR  (Flu A&B, Covid) Nasopharyngeal Swab     Status: None   Collection Time: 11/24/2020  5:00 AM   Specimen: Nasopharyngeal Swab; Nasopharyngeal(NP) swabs in vial transport medium  Result Value Ref Range Status   SARS Coronavirus 2 by RT PCR NEGATIVE NEGATIVE Final    Comment: (NOTE) SARS-CoV-2 target nucleic acids are NOT DETECTED.  The SARS-CoV-2 RNA is generally detectable in upper respiratory specimens during the acute phase of infection. The lowest concentration of SARS-CoV-2 viral copies this assay can detect is 138 copies/mL. A negative result does not preclude SARS-Cov-2 infection and should not be used as the sole basis for treatment or other patient management decisions. A negative result may occur with  improper specimen collection/handling, submission of specimen other than nasopharyngeal swab, presence of viral mutation(s) within the areas targeted by this assay, and inadequate number of viral copies(<138 copies/mL). A negative result must be combined with clinical observations, patient history, and epidemiological information. The expected result is Negative.  Fact Sheet for Patients:  EntrepreneurPulse.com.au  Fact Sheet for Healthcare Providers:  IncredibleEmployment.be  This test is no t yet approved or cleared by the [place] FDA and  has been authorized for detection and/or diagnosis of SARS-CoV-2 by FDA under an Emergency Use Authorization (EUA). This EUA will remain  in effect (meaning this test can be used) for the duration of the COVID-19 declaration under Section 564(b)(1) of the Act, 21 U.S.C.section 360bbb-3(b)(1), unless the authorization is terminated  or revoked sooner.       Influenza A by PCR NEGATIVE NEGATIVE Final   Influenza B by PCR NEGATIVE NEGATIVE Final    Comment: (NOTE) The Xpert Xpress SARS-CoV-2/FLU/RSV plus assay is intended as an aid in the diagnosis of influenza from Nasopharyngeal swab specimens  and should not be used as a sole basis for treatment. Nasal washings and aspirates are unacceptable for Xpert Xpress SARS-CoV-2/FLU/RSV testing.  Fact Sheet for Patients: EntrepreneurPulse.com.au  Fact Sheet for Healthcare Providers: IncredibleEmployment.be  This test is not yet approved or cleared by the [place] FDA and has been authorized for detection and/or diagnosis of SARS-CoV-2 by FDA under an Emergency Use Authorization (EUA). This EUA will remain in effect (meaning this test can be used) for the duration of the COVID-19 declaration under Section 564(b)(1) of the Act, 21 U.S.C. section 360bbb-3(b)(1), unless the authorization is terminated or revoked.  Performed at [facility], [place]  [address] [place], [place] [address]       Radiology Studies: No results found.   Scheduled Meds: . amiodarone  200 mg Oral Daily  . Chlorhexidine Gluconate Cloth  6 each Topical Daily  . levothyroxine  50 mcg Oral Q0600  . magnesium oxide  200 mg Oral BID  . midodrine  10 mg Oral BID WC  . potassium chloride SA  40 mEq Oral BID  . rivaroxaban  15 mg Oral Daily  . rOPINIRole  0.5 mg Oral Q2000  . scopolamine  1 patch Transdermal Q72H  . senna-docusate  1 tablet Oral QHS  . torsemide  60 mg Oral Daily  . zolpidem  5 mg Oral QHS   Continuous Infusions: . milrinone 0.5 mcg/kg/min (12/10/20 0624)     LOS: 4 days   Flora Lipps, MD [facility] 12/10/2020, 2:55 PM

## 2020-12-10 NOTE — NC FL2 (Signed)
[place] LEVEL OF CARE SCREENING TOOL     IDENTIFICATION  Patient Name: Cheryl Vargas Birthdate: 01/21/1935 Sex: female Admission Date (Current Location): 12/03/2020  [facility] and [place] Number:  Herbalist and Address:  [facility] [place], [place] [address], [place], [place] [address]      Provider Number: [id number]  Attending Physician Name and Address:  Flora Lipps, MD  Relative Name and Phone Number:  [name] son [phone])    Current Level of Care: Hospital Recommended Level of Care: [place] Prior Approval Number:    Date Approved/Denied:   PASRR Number: [id number]  Discharge Plan: SNF    Current Diagnoses: Patient Active Problem List   Diagnosis Date Noted  . Fracture of femoral neck, right, closed ([place]) 11/30/2020  . Fall at home, initial encounter 11/20/2020  . Acute kidney injury superimposed on CKD ([place]) 12/05/2020  . Hypothyroidism 11/14/2020  . Protein-calorie malnutrition, severe 10/28/2020  . Acute exacerbation of CHF (congestive heart failure) ([place]) 10/22/2020  . Acute on chronic combined systolic and diastolic CHF (congestive heart failure) ([place])   . Palliative care encounter   . Goals of care, counseling/discussion   . Encounter for hospice care discussion   . Acute on chronic systolic (congestive) heart failure ([place]) 08/02/2020  . Malnutrition of moderate degree 02/03/2020  . DNR (do not resuscitate) discussion   . Palliative care by specialist   . UTI (urinary tract infection) 02/01/2020  . Prolonged QT interval 02/01/2020  . Chronic anticoagulation 02/01/2020  . Transient hypotension 02/01/2020  . History of shingles 02/01/2020  . Near syncope 05/05/2019  . Hypercholesterolemia without hypertriglyceridemia 02/13/2018  . Syncope 01/21/2018  . Constipation 01/21/2018  . CHF exacerbation ([place]) 12/18/2017  . CKD (chronic kidney disease), stage III ([place]) 12/16/2017  . SOB  (shortness of breath)   . Abdominal pain 09/18/2017  . CHF (congestive heart failure) ([place]) 09/17/2017  . Pulmonary edema   . Cardiac device in situ   . Ventricular tachycardia ([place])   . Cardiac arrest ([place])   . Severe mitral valve regurgitation   . Permanent atrial fibrillation ([place]) 09/03/2017  . Mitral regurgitation   . Acute on chronic systolic heart failure ([place])   . Non-ischemic cardiomyopathy ([place])   . CHF (congestive heart failure), NYHA class IV ([place]) 04/24/2016  . Dizziness 12/01/2015  . Hypertension 12/01/2015  . Insomnia 12/01/2015  . Chronic combined systolic and diastolic CHF (congestive heart failure) ([place]) 12/01/2015  . Paroxysmal atrial fibrillation (HCC)   . Cardiac arrhythmia 10/20/2014  . Systolic dysfunction 20/94/7096  . Hypoventilation, idiopathic 10/20/2014  . Weakness 10/20/2014  . Closed left hip fracture ([place]) 03/17/2013  . Cardiomegaly 03/17/2013  . Frequent PVCs 03/17/2013  . Hyperlipidemia 03/17/2013    Orientation RESPIRATION BLADDER Height & Weight     Self  O2 (2L) Incontinent,External catheter Weight: 121 lb 7.6 oz (55.1 kg) Height:  5\' 5"  (165.1 cm)  BEHAVIORAL SYMPTOMS/MOOD NEUROLOGICAL BOWEL NUTRITION STATUS      Continent Diet (see d/c summary)  AMBULATORY STATUS COMMUNICATION OF NEEDS Skin   Extensive Assist Verbally Normal                       Personal Care Assistance Level of Assistance  Bathing,Feeding,Dressing Bathing Assistance: Maximum assistance Feeding assistance: Limited assistance Dressing Assistance: Limited assistance     Functional Limitations Info  Sight,Hearing,Speech Sight Info: Impaired Hearing Info: Adequate Speech Info: Adequate  SPECIAL CARE FACTORS FREQUENCY  PT (By licensed PT),OT (By licensed OT)     PT Frequency: 5X per week OT Frequency: 5X per week            Contractures Contractures Info: Not present    Additional Factors Info  Code Status,Allergies Code Status Info:  DNR Allergies Info: Sotalol, Alendronate Sodium, Compazine (Prochlorperazine), Dofetilide, Lisinopril, Ranexa (Ranolazine), Septra (Sulfamethoxazole-trimethoprim), Zolpidem Tartrate Er, Ciprofloxacin, Coreg (Carvedilol), Metoprolol           Current Medications (12/10/2020):  This is the current hospital active medication list Current Facility-Administered Medications  Medication Dose Route Frequency Provider Last Rate Last Admin  . amiodarone (PACERONE) tablet 200 mg  200 mg Oral Daily Tamala Julian, Rondell A, MD   200 mg at 12/10/20 1105  . Chlorhexidine Gluconate Cloth 2 % PADS 6 each  6 each Topical Daily Dessa Phi, DO   6 each at 12/10/20 1213  . fluticasone (FLONASE) 50 MCG/ACT nasal spray 1 spray  1 spray Each Nare Daily PRN Fuller Plan A, MD      . HYDROcodone-acetaminophen (NORCO/VICODIN) 5-325 MG per tablet 1-2 tablet  1-2 tablet Oral Q6H PRN Norval Morton, MD   1 tablet at 12/10/20 1437  . levothyroxine (SYNTHROID) tablet 50 mcg  50 mcg Oral Q0600 Fuller Plan A, MD   50 mcg at 12/10/20 0615  . loperamide (IMODIUM) capsule 2 mg  2 mg Oral PRN Fuller Plan A, MD      . LORazepam (ATIVAN) tablet 0.5 mg  0.5 mg Oral Q8H PRN Fuller Plan A, MD   0.5 mg at 12/10/20 1437  . magnesium oxide (MAG-OX) tablet 200 mg  200 mg Oral BID Fuller Plan A, MD   200 mg at 12/10/20 1104  . midodrine (PROAMATINE) tablet 10 mg  10 mg Oral BID WC Smith, Rondell A, MD   10 mg at 12/10/20 1653  . milrinone (PRIMACOR) 20 MG/100 ML (0.2 mg/mL) infusion  0.5 mcg/kg/min Intravenous Continuous Larey Dresser, MD 8.43 mL/hr at 12/10/20 1655 0.5 mcg/kg/min at 12/10/20 1655  . morphine 2 MG/ML injection 0.5 mg  0.5 mg Intravenous Q2H PRN Fuller Plan A, MD   0.5 mg at 12/10/20 0603  . morphine CONCENTRATE 10 MG/0.5ML oral solution 5 mg  5 mg Sublingual Q2H PRN Basilio Cairo, NP      . ondansetron [facility]) tablet 4 mg  4 mg Oral Q8H PRN Fuller Plan A, MD   4 mg at 12/06/2020 2132  . phenol  (CHLORASEPTIC) mouth spray 1 spray  1 spray Mouth/Throat PRN Opyd, Ilene Qua, MD   1 spray at 12/08/20 [phone]  . potassium chloride SA (KLOR-CON) CR tablet 40 mEq  40 mEq Oral BID Fuller Plan A, MD   40 mEq at 12/10/20 1106  . Rivaroxaban (XARELTO) tablet 15 mg  15 mg Oral Daily Larey Dresser, MD   15 mg at 12/10/20 1106  . rOPINIRole (REQUIP) tablet 0.5 mg  0.5 mg Oral Q2000 Smith, Rondell A, MD   0.5 mg at 12/09/20 2235  . scopolamine (TRANSDERM-SCOP) 1 MG/3DAYS 1.5 mg  1 patch Transdermal Q72H Smith, Rondell A, MD   1.5 mg at 12/09/20 0925  . senna-docusate (Senokot-S) tablet 1 tablet  1 tablet Oral QHS Fuller Plan A, MD   1 tablet at 12/09/20 2235  . simethicone (MYLICON) chewable tablet 80 mg  80 mg Oral QID PRN Vianne Bulls, MD   80 mg at 11/18/2020 2155  . torsemide (  DEMADEX) tablet 60 mg  60 mg Oral Daily Smith, Rondell A, MD   60 mg at 12/10/20 1106  . zolpidem (AMBIEN) tablet 5 mg  5 mg Oral QHS Fuller Plan A, MD   5 mg at 12/09/20 2235     Discharge Medications: Please see discharge summary for a list of discharge medications.  Relevant Imaging Results:  Relevant Lab Results:   Additional Information SS#:[id number] on milrinone drip  Abdulah Iqbal, LCSWA

## 2020-12-10 NOTE — Care Management Important Message (Signed)
Important Message  Patient Details  Name: RUTHIA PERSON MRN: [id number] Date of Birth: 28-Mar-1935   Medicare Important Message Given:  Yes     [name] 12/10/2020, 10:38 AM

## 2020-12-10 NOTE — Progress Notes (Addendum)
Patient ID: Cheryl Vargas, female   DOB: [birth year], 86 y.o.   MRN: [id number]     Advanced Heart Failure Rounding Note  PCP-Cardiologist: [name], MD  [facility]: Dr. Aundra Dubin   Subjective:    Back on PO torsemide. Wt down another 3 lb. SCr continues to rise, 1.78>>1.85>>2.01   She is confused. Daughter present at bedside. Complains of hip pain. No dyspnea.    Objective:   Weight Range: 55.1 kg Body mass index is 20.21 kg/m.   Vital Signs:   Temp:  [97.6 F (36.4 C)-99.6 F (37.6 C)] 97.7 F (36.5 C) (04/01 0500) Pulse Rate:  [80-82] 80 (04/01 0500) Resp:  [16-18] 18 (04/01 0500) BP: (103-114)/(61-75) 103/72 (04/01 0500) SpO2:  [91 %-99 %] 97 % (04/01 0500) Weight:  [55.1 kg] 55.1 kg (04/01 0653) Last BM Date: 12/05/20  Weight change: Filed Weights   12/08/20 0423 12/09/20 0500 12/10/20 0653  Weight: 57.1 kg 56.3 kg 55.1 kg    Intake/Output:   Intake/Output Summary (Last 24 hours) at 12/10/2020 1121 Last data filed at 12/09/2020 2359 Gross per 24 hour  Intake 711.48 ml  Output --  Net 711.48 ml      Physical Exam    PHYSICAL EXAM: General:  Thin elderly WF, pleasantly confused. No respiratory difficulty HEENT: normal Neck: supple. no JVD. Carotids 2+ bilat; no bruits. No lymphadenopathy or thyromegaly appreciated. Cor: PMI nondisplaced. Regular rate & rhythm. No rubs, gallops or murmurs. Lungs: clear Abdomen: soft, nontender, nondistended. No hepatosplenomegaly. No bruits or masses. Good bowel sounds. Extremities: no cyanosis, clubbing, rash, edema Neuro: alert & oriented x 3, cranial nerves grossly intact. moves all 4 extremities w/o difficulty. Affect pleasant.   Telemetry   Afib with BiV- pacing 80s (personally reviewed)  EKG    No new EKG to review   Labs    CBC Recent Labs    12/08/20 0555 12/09/20 0423  WBC 6.5 5.7  HGB 10.0* 10.1*  HCT 32.7* 32.9*  MCV 73.6* 73.6*  PLT 230 967   Basic Metabolic Panel Recent Labs     12/08/20 0555 12/09/20 0423 12/10/20 0641  NA 134* 136 140  K 4.1 4.3 4.2  CL 96* 99 101  CO2 29 27 29   GLUCOSE 126* 126* 93  BUN 33* 37* 42*  CREATININE 1.78* 1.85* 2.01*  CALCIUM 8.6* 8.9 8.9  MG 2.2 2.3  --   PHOS  --  4.1  --    Liver Function Tests No results for input(s): AST, ALT, ALKPHOS, BILITOT, PROT, ALBUMIN in the last 72 hours. No results for input(s): LIPASE, AMYLASE in the last 72 hours. Cardiac Enzymes No results for input(s): CKTOTAL, CKMB, CKMBINDEX, TROPONINI in the last 72 hours.  BNP: BNP (last 3 results) Recent Labs    10/22/20 1339 11/29/20 0913 11/16/2020 1021  BNP 1,503.8* 1,429.6* 1,866.0*    ProBNP (last 3 results) No results for input(s): PROBNP in the last 8760 hours.   D-Dimer No results for input(s): DDIMER in the last 72 hours. Hemoglobin A1C No results for input(s): HGBA1C in the last 72 hours. Fasting Lipid Panel No results for input(s): CHOL, HDL, LDLCALC, TRIG, CHOLHDL, LDLDIRECT in the last 72 hours. Thyroid Function Tests No results for input(s): TSH, T4TOTAL, T3FREE, THYROIDAB in the last 72 hours.  Invalid input(s): FREET3  Other results:   Imaging    No results found.   Medications:     Scheduled Medications: . amiodarone  200 mg Oral Daily  . Chlorhexidine  Gluconate Cloth  6 each Topical Daily  . levothyroxine  50 mcg Oral Q0600  . magnesium oxide  200 mg Oral BID  . midodrine  10 mg Oral BID WC  . potassium chloride SA  40 mEq Oral BID  . rivaroxaban  15 mg Oral Daily  . rOPINIRole  0.5 mg Oral Q2000  . scopolamine  1 patch Transdermal Q72H  . senna-docusate  1 tablet Oral QHS  . torsemide  60 mg Oral Daily  . zolpidem  5 mg Oral QHS    Infusions: . milrinone 0.5 mcg/kg/min (12/10/20 0624)    PRN Medications: fluticasone, HYDROcodone-acetaminophen, loperamide, LORazepam, morphine injection, morphine CONCENTRATE, ondansetron, phenol, simethicone    Assessment/Plan   1. Displaced right femoral  head fracture: Patient is unable to walk.  At baseline, she is getting hospice care and is on high dose IV milrinone infusion for palliation.  She would be very high risk for surgery with end stage CHF and baseline frailty, even on max milrinone her CO as measured by co-ox is low (41% this morning).  She is already under hospice care. She will not walk again without surgery, but I suspect the chances of her having surgery and then walking any time soon would be extremely small. I think that it would be reasonable to forego surgery and with hospice house [facility]) placement vs SNF with hospice.  She will discuss with her children.  - Ongoing discussions with family and palliative care needed for decision making.  2. Chronic Systolic Heart Failure:NICM,LHC 04/2016 showed no significant coronary disease. Cardiac MRI 10/17 EF 36%, diffuse HK, normal RV, biatrial enlargement, moderate MR, non-coronary LGE pattern involving the mid-wall of the basal to mid septum and inferior wall. Possibly prior myocarditis versus a form of infiltrative disease. [employer] CRT-D s/p AV nodal ablation. Echo in 1/21 showed EF <20% with moderate LV dilation and mildly decreased RV systolic function. Echo in 12/21 with EF 20-25%, severe MR, severe TR, moderate AI.Low output on 1/21 RHC (CI 1.72) and 12/21 RHC (CI 1.8). End stage HF now with home milrinone.  Baseline NYHA class IIIb-IV symptoms, no change. Volume status improved after IV Lasix. Now back on home torsemide. Wt down an additional 3 lb. Co-ox has been low this admit, in the 40s, but she is on maximum milrinone and no further options to improve this.  - Continue home torsemide 60 mg daily (home regimen).  - Continue palliative milrinone 0.5 mcg/kg/min. Do not need to follow co-ox as will not be making changes.  -She is off losartan, bisoprololand spironolactone with symptomatic hypotension and now is on midodrine 10 mg bid. - Off digoxin with elevated  level. 3. CKD stage 3: Creatinine continues to worsen, in the setting of low output/ cardiorenal, up 2.01 today.   4.Atrial fibrillation: Chronic, s/p AV nodal ablation and BiV pacing.  - Xarelto 15 mg daily restarted.  5. PVCs/VT: Frequent PVCs have limited BiV pacing. Not a good PVC ablation candidate per EP. Had VT in 1/21. Cannot tolerate sotalol orTikosyn. Did not tolerate mexilitine with nausea. Had nausea also with ranolazine. She had nausea in the past with amiodarone but seems to be tolerating it now. -Continue amiodarone 200 mg daily for PVC suppression to try to promote BiV pacing. 6. Mitral regurgitation:Severe on 12/21 echo, not candidate for intervention. 7. Hypothyroidism:On Levoxyl. Per PCP. 8. Delirium: Suspect multifactorial with low output HF, hip fracture/immobility.  - needs frequent reorientation. Daughter at bedside.   Length of Stay:  [place], PA-C  12/10/2020, 11:21 AM  Advanced Heart Failure Team Pager [phone] (M-F; 7a - 5p)  Please contact [facility] for night-coverage after hours (5p -7a ) and weekends on [email or web address]  Agree with the above note.    Patient remains delirious.  Weight is down and creatinine up to 2.  Would hold next dose of torsemide and decrease to 40 mg daily after that.    Awaiting hospice placement, if she continues milrinone will have to be SNF with hospice care.  If stops milrinone, [employer].  Will follow at a distance.   [name] 12/10/2020

## 2020-12-10 NOTE — TOC Progression Note (Signed)
Transition of Care [facility]) - Progression Note    Patient Details  Name: Cheryl Vargas MRN: [id number] Date of Birth: 1935-07-17  Transition of Care [facility]) CM/SW [place], [place] Phone Number: 12/10/2020, 3:06 PM  Clinical Narrative:    CSW was notified that pt will likely be in need of SNF. Pt is currently on IV Milrinone which is a major barrier to SNF placement. CSW spoke with several facilities who all noted they are unable to take pt. CSW spoke to [facility] with [facility] who follows the pt. She stated she would be willing to do training with a facility, and stated she would follow up with Juliann Pulse from [facility], to see if something could be arranged. She noted she would follow back up on Monday. SW will continue to follow for DC planning.   Expected Discharge Plan: Home w Hospice Care Barriers to Discharge: Continued Medical Work up  Expected Discharge Plan and Services Expected Discharge Plan: [place] In-house Referral: Hospice / Palliative Care Discharge Planning Services: CM Consult Post Acute Care Choice: Hospice Living arrangements for the past 2 months: Apartment (Pt lives in a condo.)                           [place] Arranged: RN [facility])           Social Determinants of Health (SDOH) Interventions    Readmission Risk Interventions Readmission Risk Prevention Plan 12/08/2020 10/26/2020  Transportation Screening Complete Complete  PCP or Specialist Appt within 3-5 Days - Complete  HRI or [place] - Complete  Social Work Consult for [place] Planning/Counseling - Complete  Palliative Care Screening - Complete  Medication Review Press photographer) Complete Complete  PCP or Specialist appointment within 3-5 days of discharge Complete -  [place] or [place] Complete -  SW Recovery Care/Counseling Consult Complete -  Palliative Care Screening Complete -  [place] Not Applicable -  Some recent data might be  hidden

## 2020-12-10 DEATH — deceased

## 2020-12-11 DIAGNOSIS — S72001D Fracture of unspecified part of neck of right femur, subsequent encounter for closed fracture with routine healing: Secondary | ICD-10-CM

## 2020-12-11 LAB — BASIC METABOLIC PANEL
Anion gap: 10 (ref 5–15)
BUN: 42 mg/dL — ABNORMAL HIGH (ref 8–23)
CO2: 28 mmol/L (ref 22–32)
Calcium: 8.6 mg/dL — ABNORMAL LOW (ref 8.9–10.3)
Chloride: 101 mmol/L (ref 98–111)
Creatinine, Ser: 1.94 mg/dL — ABNORMAL HIGH (ref 0.44–1.00)
GFR, Estimated: 25 mL/min — ABNORMAL LOW (ref >60)
Glucose, Bld: 121 mg/dL — ABNORMAL HIGH (ref 70–99)
Potassium: 3.7 mmol/L (ref 3.5–5.1)
Sodium: 139 mmol/L (ref 135–145)

## 2020-12-11 MED ORDER — HYDROMORPHONE HCL 1 MG/ML IJ SOLN: 0.5000 mg | INTRAMUSCULAR | Status: DC | PRN

## 2020-12-11 MED ORDER — LORAZEPAM 2 MG/ML IJ SOLN
0.5000 mg | Freq: Once | INTRAMUSCULAR | Status: AC
  Administered 2020-12-11: 0.5 mg via INTRAVENOUS
  Filled 2020-12-11: qty 1

## 2020-12-11 MED ORDER — HYDROMORPHONE HCL 1 MG/ML IJ SOLN
0.5000 mg | Freq: Three times a day (TID) | INTRAMUSCULAR | Status: DC | PRN
  Administered 2020-12-11 – 2020-12-12 (×2): 0.5 mg via INTRAVENOUS
  Filled 2020-12-11 (×2): qty 1

## 2020-12-11 NOTE — Progress Notes (Signed)
Increased restlessness overnight, constant fidgeting with cords & foley catheter. On call MD paged overnight for one time dose of ativan on top of scheduled dose but seemed to be ineffective. Spoke with pt's son Cheryl Vargas this AM and he is agreeable to ativan frequency/dosage being re-evaluated to increase Cheryl Vargas's comfort. Day shift RN updated.

## 2020-12-11 NOTE — Progress Notes (Signed)
TRH night shift.  The nursing staff reported that the patient has increased anxiety and restlessness.  She is not due for lorazepam 0.5 mg every 8 hours as needed for 2 more hours.  Lorazepam 0.5 mg IVP x1 dose ordered.  Continue supportive care and reorientation as needed.  Tennis Must, MD

## 2020-12-11 NOTE — Progress Notes (Signed)
TRH night shift.  The patient's son requested to the nursing staff that his mother received zolpidem 10 mg p.o. at bedtime, which is her home dose.  However, given her gender and age, only 5 mg are recommended.  Tennis Must, MD.

## 2020-12-11 NOTE — Progress Notes (Signed)
PROGRESS NOTE  Cheryl Vargas YQM:[id number] DOB: 08-21-1935 DOA: 11/16/2020 PCP: Lavone Orn, MD  HPI/Recap of past 24 hours: Cheryl Vargas is an 86 y.o. female with past medical history of hypertension, hyperlipidemia, nonischemic cardiomyopathy,chronic systolic CHF with EF 52-84%XL milrinone, permanent atrial fibrillation, severe mitral valve regurgitation, hypothyroidism,and CKD stage IIIb presented to hospital after sustaining a fall at home.  At baseline she uses a walker and has a Actuary.  Patient was recently hospitalized during  2/11-2/19 for acute on chronic systolic congestive heart failure exacerbation. She required being placed on milrinone and was treated with IV Lasix. Cardiology had evaluated and evaluated for milrinone at home, but suggested if condition deteriorated patient should be on hospice. She subsequently presented back to the hospital on 3/21, with complaints of shortness of breath and had been given 80 mg of Lasix IV, and switch from p.o. Lasix to torsemide.This time she was trying to get up off from the Referral in her right leg slipped and she fell hitting her head on the carpet.  CT scan of the head did not show any acute findings.  X-ray of the right femur showed impacted femoral neck fracture.   During hospitalization,patient was seen by cardiology and orthopedics.  Patient was determined to be high risk candidate for surgical intervention.  Conservative management was advised and patient was continued on pain management.  At this time, patient is awaiting for decision on going home with hospice versus hospice facility/SNF with hospice.  12/11/20: Seen and examined at bedside.  She is somnolent and confused.  Her son is at bedside.  He would like to reduce frequency of opiates and of benzodiazepines.  Assessment/Plan: Principal Problem:   Fracture of femoral neck, right, closed ([place]) Active Problems:   Permanent atrial fibrillation (HCC)   Chronic  anticoagulation   DNR (do not resuscitate) discussion   Fall at home, initial encounter   Acute kidney injury superimposed on CKD ([place])   Hypothyroidism   Chronic systolic CHF (congestive heart failure) ([place])  Closed fracture ofrightfemoral necksecondary to fall Seen by orthopedic surgery.  Patient is considered to be high risk for perioperative complication secondary to advanced heart failure on palliative milrinone.  Orthopedics recommended conservative treatment with nonweightbearing on the right lower extremity with transfer to chair if no surgery is undertaken. Physical therapy evaluation recommended skilled nursing facility placement.   TOC consulted to assist with placement  Acute metabolic encephalopathy.   With worsening heart failure renal failure and being on narcotics.  AKI on CKD stage 4 -Baseline Cr 1.27.  Creatinine down trending 1.94 from 2.01. -Avoid nephrotoxic agents  -Monitor renal output   Acute on chronic systolic congestive heart failure, Severe MR/TR Cardiology on board.  Patient with end-stage heart failure.  Patient is on palliative milrinone at home.  Continue midodrine, torsemide as per cardiology.  Not a good prognosis is the patient is already confused and disoriented with worsening renal failure.  Patient's family is aware of the poor prognosis.   Permanentatrial fibrillation on chronic anticoagulation.  Status post AV nodal ablation and biventricular pacing.  Continue Xarelto, amiodarone  Hypothyroidism  TSH 14 Continue Synthroid  Anxiety Continue Ativan as needed.  Goals of care.     Overall prognosis is poor.   Patient with significant functional and mental decline.   Patient is high risk for surgical intervention.   Spoke with the transition of care and care team today regarding plan for disposition.   PT now recommending skilled  nursing facility placement.  DVT prophylaxis:  Xarelto  Code Status: DNR  Family Communication:   Patient's son stated that he was not sure what to do and was going to discuss with the rest of the family.  He is unsure about whether to go to hospice facility versus home with hospice.  Disposition Plan:  Status is: Inpatient  Remains inpatient appropriate because:Ongoing active pain requiring inpatient pain management and Unsafe d/c plan  Dispo: The patient is from: Home  Anticipated d/c is to: SNF vs hospice, on milrinone drip.  Unsure disposition.  Communicated with transition of care, transition of care on board  Patient currently is not medically stable to d/c.              Difficult to place patient No   Consultants:   Orthopedic surgery  Cardiology  Palliative care   Antimicrobials:   None     Status is: Inpatient   Dispo:  Patient From: Home  Planned Disposition: Home Hospice  Medically stable for discharge: Yes          Objective: Vitals:   12/10/20 2012 12/11/20 0021 12/11/20 0421 12/11/20 1222  BP: 122/67 111/68 107/67 123/76  Pulse: 84 80 79 82  Resp: 20 15 20 19   Temp: 97.8 F (36.6 C) (!) 97.5 F (36.4 C) 97.6 F (36.4 C) 98.2 F (36.8 C)  TempSrc: Axillary Oral Axillary Oral  SpO2: 93% 93% 94%   Weight:   52.6 kg   Height:        Intake/Output Summary (Last 24 hours) at 12/11/2020 1425 Last data filed at 12/11/2020 0047 Gross per 24 hour  Intake 800.52 ml  Output 1800 ml  Net -999.48 ml   Filed Weights   12/09/20 0500 12/10/20 0653 12/11/20 0421  Weight: 56.3 kg 55.1 kg 52.6 kg    Exam:  . General: 86 y.o. year-old female well developed well nourished in no acute distress.  Somnolent, when awakens she is confused. . Cardiovascular: Regular rate and rhythm with no rubs or gallops.  No thyromegaly or JVD noted.   Marland Kitchen Respiratory: Clear to auscultation with no wheezes or rales. Good inspiratory effort. . Abdomen: Soft nontender nondistended with normal bowel sounds x4 quadrants. . Musculoskeletal: No  lower extremity edema. . Skin: No ulcerative lesions noted or rashes. . Psychiatry: Mood is appropriate for condition and setting   Data Reviewed: CBC: Recent Labs  Lab 11/09/2020 0500 12/05/2020 0805 12/08/20 0555 12/09/20 0423  WBC 6.8 5.4 6.5 5.7  NEUTROABS 5.5  --   --   --   HGB 10.4* 9.5* 10.0* 10.1*  HCT 33.1* 31.3* 32.7* 32.9*  MCV 73.2* 73.6* 73.6* 73.6*  PLT 225 207 230 540   Basic Metabolic Panel: Recent Labs  Lab 12/08/2020 0805 12/08/20 0555 12/09/20 0423 12/10/20 0641 12/11/20 0500  NA 136 134* 136 140 139  K 3.8 4.1 4.3 4.2 3.7  CL 98 96* 99 101 101  CO2 31 29 27 29 28   GLUCOSE 112* 126* 126* 93 121*  BUN 31* 33* 37* 42* 42*  CREATININE 1.67* 1.78* 1.85* 2.01* 1.94*  CALCIUM 8.6* 8.6* 8.9 8.9 8.6*  MG  --  2.2 2.3  --   --   PHOS  --   --  4.1  --   --    GFR: Estimated Creatinine Clearance: 18.2 mL/min (A) (by C-G formula based on SCr of 1.94 mg/dL (H)). Liver Function Tests: No results for input(s): AST, ALT, ALKPHOS, BILITOT, PROT,  ALBUMIN in the last 168 hours. No results for input(s): LIPASE, AMYLASE in the last 168 hours. No results for input(s): AMMONIA in the last 168 hours. Coagulation Profile: Recent Labs  Lab 11/18/2020 0525  INR 1.7*   Cardiac Enzymes: No results for input(s): CKTOTAL, CKMB, CKMBINDEX, TROPONINI in the last 168 hours. BNP (last 3 results) No results for input(s): PROBNP in the last 8760 hours. HbA1C: No results for input(s): HGBA1C in the last 72 hours. CBG: No results for input(s): GLUCAP in the last 168 hours. Lipid Profile: No results for input(s): CHOL, HDL, LDLCALC, TRIG, CHOLHDL, LDLDIRECT in the last 72 hours. Thyroid Function Tests: No results for input(s): TSH, T4TOTAL, FREET4, T3FREE, THYROIDAB in the last 72 hours. Anemia Panel: No results for input(s): VITAMINB12, FOLATE, FERRITIN, TIBC, IRON, RETICCTPCT in the last 72 hours. Urine analysis:    Component Value Date/Time   COLORURINE YELLOW 10/23/2020  0946   APPEARANCEUR CLEAR 10/23/2020 0946   LABSPEC >1.030 (H) 10/23/2020 0946   PHURINE 5.5 10/23/2020 0946   GLUCOSEU NEGATIVE 10/23/2020 0946   HGBUR TRACE (A) 10/23/2020 0946   BILIRUBINUR NEGATIVE 10/23/2020 0946   KETONESUR NEGATIVE 10/23/2020 0946   PROTEINUR 100 (A) 10/23/2020 0946   UROBILINOGEN 0.2 03/17/2013 1423   NITRITE NEGATIVE 10/23/2020 0946   LEUKOCYTESUR SMALL (A) 10/23/2020 0946   Sepsis Labs: @LABRCNTIP (procalcitonin:4,lacticidven:4)  ) Recent Results (from the past 240 hour(s))  Resp Panel by RT-PCR (Flu A&B, Covid) Nasopharyngeal Swab     Status: None   Collection Time: 11/13/2020  5:00 AM   Specimen: Nasopharyngeal Swab; Nasopharyngeal(NP) swabs in vial transport medium  Result Value Ref Range Status   SARS Coronavirus 2 by RT PCR NEGATIVE NEGATIVE Final    Comment: (NOTE) SARS-CoV-2 target nucleic acids are NOT DETECTED.  The SARS-CoV-2 RNA is generally detectable in upper respiratory specimens during the acute phase of infection. The lowest concentration of SARS-CoV-2 viral copies this assay can detect is 138 copies/mL. A negative result does not preclude SARS-Cov-2 infection and should not be used as the sole basis for treatment or other patient management decisions. A negative result may occur with  improper specimen collection/handling, submission of specimen other than nasopharyngeal swab, presence of viral mutation(s) within the areas targeted by this assay, and inadequate number of viral copies(<138 copies/mL). A negative result must be combined with clinical observations, patient history, and epidemiological information. The expected result is Negative.  Fact Sheet for Patients:  EntrepreneurPulse.com.au  Fact Sheet for Healthcare Providers:  IncredibleEmployment.be  This test is no t yet approved or cleared by the [place] FDA and  has been authorized for detection and/or diagnosis of SARS-CoV-2  by FDA under an Emergency Use Authorization (EUA). This EUA will remain  in effect (meaning this test can be used) for the duration of the COVID-19 declaration under Section 564(b)(1) of the Act, 21 U.S.C.section 360bbb-3(b)(1), unless the authorization is terminated  or revoked sooner.       Influenza A by PCR NEGATIVE NEGATIVE Final   Influenza B by PCR NEGATIVE NEGATIVE Final    Comment: (NOTE) The Xpert Xpress SARS-CoV-2/FLU/RSV plus assay is intended as an aid in the diagnosis of influenza from Nasopharyngeal swab specimens and should not be used as a sole basis for treatment. Nasal washings and aspirates are unacceptable for Xpert Xpress SARS-CoV-2/FLU/RSV testing.  Fact Sheet for Patients: EntrepreneurPulse.com.au  Fact Sheet for Healthcare Providers: IncredibleEmployment.be  This test is not yet approved or cleared by the [place] FDA and has  been authorized for detection and/or diagnosis of SARS-CoV-2 by FDA under an Emergency Use Authorization (EUA). This EUA will remain in effect (meaning this test can be used) for the duration of the COVID-19 declaration under Section 564(b)(1) of the Act, 21 U.S.C. section 360bbb-3(b)(1), unless the authorization is terminated or revoked.  Performed at [facility], [place] [address], [place], [place] [address]       Studies: No results found.  Scheduled Meds: . amiodarone  200 mg Oral Daily  . Chlorhexidine Gluconate Cloth  6 each Topical Daily  . levothyroxine  50 mcg Oral Q0600  . magnesium oxide  200 mg Oral BID  . midodrine  10 mg Oral BID WC  . potassium chloride SA  40 mEq Oral BID  . rivaroxaban  15 mg Oral Daily  . rOPINIRole  0.5 mg Oral Q2000  . scopolamine  1 patch Transdermal Q72H  . senna-docusate  1 tablet Oral QHS  . [START ON 12/12/2020] torsemide  40 mg Oral Daily  . zolpidem  5 mg Oral QHS    Continuous Infusions: . milrinone 0.5 mcg/kg/min (12/11/20  0513)     LOS: 5 days     Kayleen Memos, MD [facility] Pager [phone]  If 7PM-7AM, please contact night-coverage [email or web address] Password [email or web address] 12/11/2020, 2:25 PM

## 2020-12-12 DIAGNOSIS — S72001D Fracture of unspecified part of neck of right femur, subsequent encounter for closed fracture with routine healing: Secondary | ICD-10-CM | POA: Diagnosis not present

## 2020-12-12 DIAGNOSIS — I5022 Chronic systolic (congestive) heart failure: Secondary | ICD-10-CM

## 2020-12-12 MED ORDER — ONDANSETRON HCL 4 MG/2ML IJ SOLN
4.0000 mg | Freq: Four times a day (QID) | INTRAMUSCULAR | Status: DC | PRN
  Administered 2020-12-12: 4 mg via INTRAVENOUS
  Filled 2020-12-12: qty 2

## 2020-12-12 MED ORDER — GLYCOPYRROLATE 0.2 MG/ML IJ SOLN: 0.2000 mg | INTRAMUSCULAR | Status: DC | PRN

## 2020-12-12 MED ORDER — HYDROMORPHONE HCL 1 MG/ML IJ SOLN
0.5000 mg | INTRAMUSCULAR | Status: DC | PRN
  Administered 2020-12-12: 0.5 mg via INTRAVENOUS
  Filled 2020-12-12: qty 1

## 2020-12-12 MED ORDER — FLUTICASONE PROPIONATE 50 MCG/ACT NA SUSP: 1.0000 | Freq: Every day | NASAL | Status: DC | PRN

## 2020-12-12 MED ORDER — PHENOL 1.4 % MT LIQD: 1.0000 | OROMUCOSAL | Status: DC | PRN

## 2020-12-12 MED ORDER — LORAZEPAM 2 MG/ML PO CONC: 1.0000 mg | ORAL | Status: DC | PRN

## 2020-12-12 MED ORDER — FLUTICASONE PROPIONATE 50 MCG/ACT NA SUSP
1.0000 | Freq: Every day | NASAL | Status: DC
  Filled 2020-12-12: qty 16

## 2020-12-12 MED ORDER — HYDROMORPHONE HCL 1 MG/ML IJ SOLN
0.5000 mg | INTRAMUSCULAR | Status: DC | PRN
  Administered 2020-12-12 – 2020-12-13 (×5): 0.5 mg via INTRAVENOUS
  Filled 2020-12-12 (×5): qty 1

## 2020-12-12 MED ORDER — ZOLPIDEM TARTRATE 5 MG PO TABS
5.0000 mg | ORAL_TABLET | Freq: Every day | ORAL | Status: DC
  Administered 2020-12-13: 5 mg via ORAL
  Filled 2020-12-12: qty 1

## 2020-12-12 MED ORDER — LORAZEPAM 1 MG PO TABS: 1.0000 mg | ORAL_TABLET | ORAL | Status: DC | PRN

## 2020-12-12 MED ORDER — LORAZEPAM 2 MG/ML IJ SOLN
0.5000 mg | INTRAMUSCULAR | Status: DC | PRN
  Administered 2020-12-12: 0.5 mg via INTRAVENOUS
  Filled 2020-12-12: qty 1

## 2020-12-12 MED ORDER — GLYCOPYRROLATE 1 MG PO TABS
1.0000 mg | ORAL_TABLET | ORAL | Status: DC | PRN
  Filled 2020-12-12: qty 1

## 2020-12-12 MED ORDER — LORAZEPAM 2 MG/ML IJ SOLN
1.0000 mg | INTRAMUSCULAR | Status: DC | PRN
  Administered 2020-12-12 – 2020-12-14 (×7): 1 mg via INTRAVENOUS
  Filled 2020-12-12 (×7): qty 1

## 2020-12-12 NOTE — Progress Notes (Signed)
PROGRESS NOTE  Cheryl Vargas TFT:[id number] DOB: 11-Jul-1935 DOA: 11/25/2020 PCP: Lavone Orn, MD  HPI/Recap of past 24 hours: Cheryl Vargas is an 86 y.o.female with past medical history of hypertension, hyperlipidemia, nonischemic cardiomyopathy,chronic systolic CHF with LVEF 70-62%BJ milrinone, permanent atrial fibrillation on Xarelto, severe mitral valve regurgitation, hypothyroidism,and CKD stage IIIb presented to hospital after sustaining a fall at home.  At baseline she uses a walker and has a Actuary.  Patient was recently hospitalized during  2/11-2/19 for acute on chronic systolic congestive heart failure exacerbation. She required being placed on milrinone and was treated with IV Lasix. Cardiology had evaluated for milrinone at home, but suggested if condition deteriorated patient should be on hospice. She subsequently presented back to the hospital on 11/29/20, with complaints of shortness of breath and had been given 80 mg of Lasix IV, and switched from p.o. Lasix to torsemide.This time she presented to the hospital after a fall at home.  Her right leg slipped and she fell hitting her head on the carpet.  CT scan of the head did not show any acute intracranial findings.  X-ray of the right femur showed impacted femoral neck fracture.  She was seen by orthopedic surgery.  Patient was determined to be high risk candidate for surgical intervention.  Conservative management was advised and patient was continued on pain management.    Hospital course complicated by worsening clinical picture, associated with confusion also suspected cardiorenal syndrome.  Seen by cardiology with recommendation for hospice care.  12/12/20: Seen and examined at her bedside.  She is asleep.  Shallow breaths noted.  Called her son Cheryl Vargas to give updates.  Prognosis remains poor.  Palliative care team has been consulted to assist with establishing goals of care.  Assessment/Plan: Principal Problem:   Fracture  of femoral neck, right, closed ([place]) Active Problems:   Permanent atrial fibrillation (HCC)   Chronic anticoagulation   DNR (do not resuscitate) discussion   Fall at home, initial encounter   Acute kidney injury superimposed on CKD ([place])   Hypothyroidism   Chronic systolic CHF (congestive heart failure) ([place])  Closed fracture ofrightfemoral necksecondary to mechanical fall Seen by orthopedic surgery.  Patient is considered to be high risk for perioperative complication secondary to advanced heart failure on palliative milrinone.  Orthopedics recommended conservative treatment with nonweightbearing on the right lower extremity with transfer to chair if no surgery is undertaken.  Physical therapy evaluation recommended skilled nursing facility placement.   TOC consulted to assist with placement Patient was also seen by cardiology with recommendation for hospice care.  Acute on chronic systolic congestive heart failure, Severe MR/TR Last 2D echo done on 08/02/2020 showed LVEF 20 to 25%, left ventricle demonstrate global hypokinesis.  Left and right atrial size severely dilated.  Severe mitral valve regurgitation.  Severe tricuspid valve regurgitation.  Moderate aortic valve regurgitation. Seen by cardiology.  She was on palliative milrinone at home prior to admission. Continue midodrine 10 mg twice daily, torsemide 40 mg daily as recommended by cardiology.   Poor prognosis.  Family is aware.   Palliative care team consulted to assist with establishing goals of care.   Suspected cardiorenal syndrome Baseline creatinine appears to be 1.2 with GFR 42. Latest creatinine 1.94 with GFR 25. 850 cc urine output recorded in the last 24 hours.  Acute metabolic encephalopathy likely multifactorial in the setting of advanced heart failure, narcotics, and acute kidney injury. Treat underlying conditions Poor prognosis   Permanentatrial fibrillation on chronic  anticoagulation.  Status post AV  nodal ablation and biventricular pacing.   Currently on Xarelto and amiodarone  Hypothyroidism  TSH 14 on 11/21/2020. Last free T4 1.04 on 10/27/2020. Continue home levothyroxine. Will need to repeat TSH in 4 to 6 weeks outpatient when acute illness is resolved.  Chronic anxiety At home she is on 0.5 mg p.o. Ativan 3 times daily as needed for anxiety.  Goals of care.     Overall prognosis is very poor.  Patient with significant functional and mental decline.   Patient is high risk for surgical intervention.   Palliative care team consulted to assist with establishing goals of care. Cardiology has recommended hospice care.  DVT prophylaxis:  Xarelto  Code Status: DNR  Family Communication:  Updated her son Cheryl Vargas on 12/12/2020 via telephone.  Disposition Plan:  Status is: Inpatient  Remains inpatient appropriate because:Ongoing active pain requiring inpatient pain management and Unsafe d/c plan  Dispo: The patient is from: Home  Anticipated d/c is to: Hospital death versus residential hospice versus home with hospice care.   Patient currently is not medically stable to d/c.              Difficult to place patient: Not applicable   Consultants:   Orthopedic surgery  Cardiology  Palliative care   Antimicrobials:   None     Status is: Inpatient   Dispo:  Patient From: Home  Planned Disposition: Home Hospice  Medically stable for discharge: No.         Objective: Vitals:   12/11/20 0421 12/11/20 1222 12/12/20 0655 12/12/20 0659  BP: 107/67 123/76  118/81  Pulse: 79 82  78  Resp: 20 19  15   Temp: 97.6 F (36.4 C) 98.2 F (36.8 C)  (!) 97.3 F (36.3 C)  TempSrc: Axillary Oral  Oral  SpO2: 94%   93%  Weight: 52.6 kg  52.4 kg   Height:        Intake/Output Summary (Last 24 hours) at 12/12/2020 1059 Last data filed at 12/11/2020 1753 Gross per 24 hour  Intake --  Output 850 ml  Net -850 ml   Filed Weights    12/10/20 0653 12/11/20 0421 12/12/20 0655  Weight: 55.1 kg 52.6 kg 52.4 kg    Exam:  . General: 86 y.o. year-old female chronically ill-appearing, frail, asleep.  Shallow breaths noted.   . Cardiovascular: Regular rate and rhythm no rubs or gallops.    Marland Kitchen Respiratory: Clear to auscultation no wheezes or rales.  Poor respiratory effort.  . Abdomen: Soft nontender hypoactive bowel sounds.   . Musculoskeletal: No lower extremity edema bilaterally. . Skin: No ulcerative lesions noted. Marland Kitchen Psychiatry: Unable to assess mood due to minimal responsiveness.   Data Reviewed: CBC: Recent Labs  Lab 11/24/2020 0500 11/14/2020 0805 12/08/20 0555 12/09/20 0423  WBC 6.8 5.4 6.5 5.7  NEUTROABS 5.5  --   --   --   HGB 10.4* 9.5* 10.0* 10.1*  HCT 33.1* 31.3* 32.7* 32.9*  MCV 73.2* 73.6* 73.6* 73.6*  PLT 225 207 230 381   Basic Metabolic Panel: Recent Labs  Lab 12/06/2020 0805 12/08/20 0555 12/09/20 0423 12/10/20 0641 12/11/20 0500  NA 136 134* 136 140 139  K 3.8 4.1 4.3 4.2 3.7  CL 98 96* 99 101 101  CO2 31 29 27 29 28   GLUCOSE 112* 126* 126* 93 121*  BUN 31* 33* 37* 42* 42*  CREATININE 1.67* 1.78* 1.85* 2.01* 1.94*  CALCIUM 8.6* 8.6* 8.9 8.9  8.6*  MG  --  2.2 2.3  --   --   PHOS  --   --  4.1  --   --    GFR: Estimated Creatinine Clearance: 18.2 mL/min (A) (by C-G formula based on SCr of 1.94 mg/dL (H)). Liver Function Tests: No results for input(s): AST, ALT, ALKPHOS, BILITOT, PROT, ALBUMIN in the last 168 hours. No results for input(s): LIPASE, AMYLASE in the last 168 hours. No results for input(s): AMMONIA in the last 168 hours. Coagulation Profile: Recent Labs  Lab 12/09/2020 0525  INR 1.7*   Cardiac Enzymes: No results for input(s): CKTOTAL, CKMB, CKMBINDEX, TROPONINI in the last 168 hours. BNP (last 3 results) No results for input(s): PROBNP in the last 8760 hours. HbA1C: No results for input(s): HGBA1C in the last 72 hours. CBG: No results for input(s): GLUCAP in the  last 168 hours. Lipid Profile: No results for input(s): CHOL, HDL, LDLCALC, TRIG, CHOLHDL, LDLDIRECT in the last 72 hours. Thyroid Function Tests: No results for input(s): TSH, T4TOTAL, FREET4, T3FREE, THYROIDAB in the last 72 hours. Anemia Panel: No results for input(s): VITAMINB12, FOLATE, FERRITIN, TIBC, IRON, RETICCTPCT in the last 72 hours. Urine analysis:    Component Value Date/Time   COLORURINE YELLOW 10/23/2020 0946   APPEARANCEUR CLEAR 10/23/2020 0946   LABSPEC >1.030 (H) 10/23/2020 0946   PHURINE 5.5 10/23/2020 0946   GLUCOSEU NEGATIVE 10/23/2020 0946   HGBUR TRACE (A) 10/23/2020 0946   BILIRUBINUR NEGATIVE 10/23/2020 0946   KETONESUR NEGATIVE 10/23/2020 0946   PROTEINUR 100 (A) 10/23/2020 0946   UROBILINOGEN 0.2 03/17/2013 1423   NITRITE NEGATIVE 10/23/2020 0946   LEUKOCYTESUR SMALL (A) 10/23/2020 0946   Sepsis Labs: @LABRCNTIP (procalcitonin:4,lacticidven:4)  ) Recent Results (from the past 240 hour(s))  Resp Panel by RT-PCR (Flu A&B, Covid) Nasopharyngeal Swab     Status: None   Collection Time: 11/20/2020  5:00 AM   Specimen: Nasopharyngeal Swab; Nasopharyngeal(NP) swabs in vial transport medium  Result Value Ref Range Status   SARS Coronavirus 2 by RT PCR NEGATIVE NEGATIVE Final    Comment: (NOTE) SARS-CoV-2 target nucleic acids are NOT DETECTED.  The SARS-CoV-2 RNA is generally detectable in upper respiratory specimens during the acute phase of infection. The lowest concentration of SARS-CoV-2 viral copies this assay can detect is 138 copies/mL. A negative result does not preclude SARS-Cov-2 infection and should not be used as the sole basis for treatment or other patient management decisions. A negative result may occur with  improper specimen collection/handling, submission of specimen other than nasopharyngeal swab, presence of viral mutation(s) within the areas targeted by this assay, and inadequate number of viral copies(<138 copies/mL). A negative  result must be combined with clinical observations, patient history, and epidemiological information. The expected result is Negative.  Fact Sheet for Patients:  EntrepreneurPulse.com.au  Fact Sheet for Healthcare Providers:  IncredibleEmployment.be  This test is no t yet approved or cleared by the [place] FDA and  has been authorized for detection and/or diagnosis of SARS-CoV-2 by FDA under an Emergency Use Authorization (EUA). This EUA will remain  in effect (meaning this test can be used) for the duration of the COVID-19 declaration under Section 564(b)(1) of the Act, 21 U.S.C.section 360bbb-3(b)(1), unless the authorization is terminated  or revoked sooner.       Influenza A by PCR NEGATIVE NEGATIVE Final   Influenza B by PCR NEGATIVE NEGATIVE Final    Comment: (NOTE) The Xpert Xpress SARS-CoV-2/FLU/RSV plus assay is intended as an aid  in the diagnosis of influenza from Nasopharyngeal swab specimens and should not be used as a sole basis for treatment. Nasal washings and aspirates are unacceptable for Xpert Xpress SARS-CoV-2/FLU/RSV testing.  Fact Sheet for Patients: EntrepreneurPulse.com.au  Fact Sheet for Healthcare Providers: IncredibleEmployment.be  This test is not yet approved or cleared by the [place] FDA and has been authorized for detection and/or diagnosis of SARS-CoV-2 by FDA under an Emergency Use Authorization (EUA). This EUA will remain in effect (meaning this test can be used) for the duration of the COVID-19 declaration under Section 564(b)(1) of the Act, 21 U.S.C. section 360bbb-3(b)(1), unless the authorization is terminated or revoked.  Performed at [facility], [place] [address] [place], [place] [address]       Studies: No results found.  Scheduled Meds: . amiodarone  200 mg Oral Daily  . Chlorhexidine Gluconate Cloth  6 each Topical Daily  .  levothyroxine  50 mcg Oral Q0600  . magnesium oxide  200 mg Oral BID  . midodrine  10 mg Oral BID WC  . potassium chloride SA  40 mEq Oral BID  . rivaroxaban  15 mg Oral Daily  . rOPINIRole  0.5 mg Oral Q2000  . scopolamine  1 patch Transdermal Q72H  . senna-docusate  1 tablet Oral QHS  . torsemide  40 mg Oral Daily  . zolpidem  5 mg Oral QHS    Continuous Infusions: . milrinone 0.5 mcg/kg/min (12/12/20 0110)     LOS: 6 days     Kayleen Memos, MD [facility] Pager [phone]  If 7PM-7AM, please contact night-coverage [email or web address] Password [facility] 12/12/2020, 10:59 AM

## 2020-12-12 NOTE — Progress Notes (Signed)
Patient's family has made decision for full comfort measures.  Comfort care measures in place.  Family has requested milrinone being stopped.  Anticipated in hospital death.

## 2020-12-12 NOTE — Progress Notes (Signed)
Late entry  Around midnight pt was calling out in pain from bed. Per pt 8/10 right hip pain; exhibiting increased anxiety and scooting to edge & foot of the bed. Adjusted pt in bed & gave PRN dose of ativan & dilaudid. Pt slept comfortably but was arousable for remainder of evening. Received call from pt's son Cheryl Vargas this AM & updated him regarding overnight events.

## 2020-12-13 DIAGNOSIS — I5022 Chronic systolic (congestive) heart failure: Secondary | ICD-10-CM | POA: Diagnosis not present

## 2020-12-13 DIAGNOSIS — W19XXXA Unspecified fall, initial encounter: Secondary | ICD-10-CM | POA: Diagnosis not present

## 2020-12-13 DIAGNOSIS — Z66 Do not resuscitate: Secondary | ICD-10-CM

## 2020-12-13 DIAGNOSIS — S72001A Fracture of unspecified part of neck of right femur, initial encounter for closed fracture: Secondary | ICD-10-CM | POA: Diagnosis not present

## 2020-12-13 DIAGNOSIS — N189 Chronic kidney disease, unspecified: Secondary | ICD-10-CM

## 2020-12-13 DIAGNOSIS — Z7189 Other specified counseling: Secondary | ICD-10-CM | POA: Diagnosis not present

## 2020-12-13 DIAGNOSIS — N179 Acute kidney failure, unspecified: Secondary | ICD-10-CM

## 2020-12-13 DIAGNOSIS — S72001D Fracture of unspecified part of neck of right femur, subsequent encounter for closed fracture with routine healing: Secondary | ICD-10-CM | POA: Diagnosis not present

## 2020-12-13 DIAGNOSIS — Y92009 Unspecified place in unspecified non-institutional (private) residence as the place of occurrence of the external cause: Secondary | ICD-10-CM

## 2020-12-13 MED ORDER — SODIUM CHLORIDE 0.9 % IV SOLN
0.5000 mg/h | INTRAVENOUS | Status: DC
  Administered 2020-12-13 – 2020-12-14 (×2): 0.5 mg/h via INTRAVENOUS
  Filled 2020-12-13 (×2): qty 2.5

## 2020-12-13 MED ORDER — HYDROMORPHONE BOLUS VIA INFUSION
0.5000 mg | INTRAVENOUS | Status: DC | PRN
  Filled 2020-12-13: qty 1

## 2020-12-13 NOTE — Progress Notes (Addendum)
Patient ID: Cheryl Vargas, female   DOB: 1934/10/14, 86 y.o.   MRN: [id number]  Patient now with full comfort measures.  She is sleeping quietly.  Spoke with patient's son at bedside. She is off milrinone.   [name] 12/13/2020

## 2020-12-13 NOTE — Progress Notes (Signed)
PROGRESS NOTE  Cheryl Vargas OVF:[id number] DOB: 15-Feb-1935 DOA: 12/02/2020 PCP: Lavone Orn, MD  HPI/Recap of past 24 hours: Cheryl Vargas is an 86 y.o.female with past medical history of hypertension, hyperlipidemia, nonischemic cardiomyopathy,chronic systolic CHF with LVEF 84-16%SA milrinone, permanent atrial fibrillation on Xarelto, severe mitral valve regurgitation, hypothyroidism,and CKD stage IIIb presented to hospital after sustaining a fall at home.  At baseline she uses a walker and has a Actuary.  Patient was recently hospitalized during  2/11-2/19 for acute on chronic systolic congestive heart failure exacerbation. She required being placed on milrinone and was treated with IV Lasix. Cardiology had evaluated for milrinone at home, but suggested if condition deteriorated patient should be on hospice. She subsequently presented back to the hospital on 11/29/20, with complaints of shortness of breath and had been given 80 mg of Lasix IV, and switched from p.o. Lasix to torsemide.This time she presented to the hospital after a fall at home.  Her right leg slipped and she fell hitting her head on the carpet.  CT scan of the head did not show any acute intracranial findings.  X-ray of the right femur showed impacted femoral neck fracture.  She was seen by orthopedic surgery.  Patient was determined to be high risk candidate for surgical intervention.  Conservative management was advised and patient was continued on pain management.    Hospital course complicated by worsening clinical picture, associated with confusion also suspected cardiorenal syndrome.  Seen by cardiology with recommendation for hospice care.  Patient's family has made decision for full comfort measures.  Milrinone discontinued and transitioned to full comfort care on 12/12/2020.  12/13/20: Seen at her bedside.  She is sleeping quietly.  Does not appear in pain or uncomfortable.  Full comfort care measures in  place.  Assessment/Plan: Principal Problem:   Fracture of femoral neck, right, closed ([place]) Active Problems:   Permanent atrial fibrillation (HCC)   Chronic anticoagulation   DNR (do not resuscitate) discussion   Fall at home, initial encounter   Acute kidney injury superimposed on CKD ([place])   Hypothyroidism   Chronic systolic CHF (congestive heart failure) (HCC)  Assessment: -Closed fracture ofrightfemoral necksecondary to mechanical fall -Acute on chronic systolic congestive heart failure, Severe MR/TR -Suspected cardiorenal syndrome -Acute metabolic encephalopathy likely multifactorial in the setting of advanced heart failure, narcotics, and acute kidney injury.  -Permanentatrial fibrillation on chronic anticoagulation.  -Hypothyroidism  -Chronic anxiety -Full comfort care only.     Plan: All focus on comfort care.  DVT prophylaxis:  Comfort care.  Code Status: DNR/comfort care.  Family Communication:  Updated her son [name] at bedside.  Disposition Plan:  Status is: Inpatient  Remains inpatient appropriate because: Requiring IV opiates and IV benzodiazepine, end-of-life care.  Dispo: The patient is from: Home  Anticipated d/c is to: Anticipated hospital death.  Patient currently is not medically stable to d/c.              Difficult to place patient: Not applicable   Consultants:   Orthopedic surgery  Cardiology  Palliative care   Antimicrobials:   None     Objective: Vitals:   12/11/20 1222 12/12/20 0655 12/12/20 0659 12/12/20 2101  BP: 123/76  118/81 118/66  Pulse: 82  78   Resp: 19  15   Temp: 98.2 F (36.8 C)  (!) 97.3 F (36.3 C)   TempSrc: Oral  Oral   SpO2:   93%   Weight:  52.4 kg  Height:        Intake/Output Summary (Last 24 hours) at 12/13/2020 1546 Last data filed at 12/13/2020 0515 Gross per 24 hour  Intake --  Output 550 ml  Net -550 ml   Filed Weights   12/10/20 0653 12/11/20 0421  12/12/20 0655  Weight: 55.1 kg 52.6 kg 52.4 kg    Exam:  . General: 86 y.o. year-old female obtunded unresponsive.   . Cardiovascular: Regular rate and rhythm no rubs or gallops. Marland Kitchen Respiratory: Shallow breathing. . Abdomen: Soft nontender hypoactive bowel sounds. . Musculoskeletal: No lower extremity edema bilaterally.   . Skin: No ulcerative lesions noted. Marland Kitchen Psychiatry: Obtunded unresponsive.   Data Reviewed: CBC: Recent Labs  Lab 12/09/2020 0805 12/08/20 0555 12/09/20 0423  WBC 5.4 6.5 5.7  HGB 9.5* 10.0* 10.1*  HCT 31.3* 32.7* 32.9*  MCV 73.6* 73.6* 73.6*  PLT 207 230 706   Basic Metabolic Panel: Recent Labs  Lab 11/17/2020 0805 12/08/20 0555 12/09/20 0423 12/10/20 0641 12/11/20 0500  NA 136 134* 136 140 139  K 3.8 4.1 4.3 4.2 3.7  CL 98 96* 99 101 101  CO2 31 29 27 29 28   GLUCOSE 112* 126* 126* 93 121*  BUN 31* 33* 37* 42* 42*  CREATININE 1.67* 1.78* 1.85* 2.01* 1.94*  CALCIUM 8.6* 8.6* 8.9 8.9 8.6*  MG  --  2.2 2.3  --   --   PHOS  --   --  4.1  --   --    GFR: Estimated Creatinine Clearance: 18.2 mL/min (A) (by C-G formula based on SCr of 1.94 mg/dL (H)). Liver Function Tests: No results for input(s): AST, ALT, ALKPHOS, BILITOT, PROT, ALBUMIN in the last 168 hours. No results for input(s): LIPASE, AMYLASE in the last 168 hours. No results for input(s): AMMONIA in the last 168 hours. Coagulation Profile: No results for input(s): INR, PROTIME in the last 168 hours. Cardiac Enzymes: No results for input(s): CKTOTAL, CKMB, CKMBINDEX, TROPONINI in the last 168 hours. BNP (last 3 results) No results for input(s): PROBNP in the last 8760 hours. HbA1C: No results for input(s): HGBA1C in the last 72 hours. CBG: No results for input(s): GLUCAP in the last 168 hours. Lipid Profile: No results for input(s): CHOL, HDL, LDLCALC, TRIG, CHOLHDL, LDLDIRECT in the last 72 hours. Thyroid Function Tests: No results for input(s): TSH, T4TOTAL, FREET4, T3FREE, THYROIDAB  in the last 72 hours. Anemia Panel: No results for input(s): VITAMINB12, FOLATE, FERRITIN, TIBC, IRON, RETICCTPCT in the last 72 hours. Urine analysis:    Component Value Date/Time   COLORURINE YELLOW 10/23/2020 0946   APPEARANCEUR CLEAR 10/23/2020 0946   LABSPEC >1.030 (H) 10/23/2020 0946   PHURINE 5.5 10/23/2020 0946   GLUCOSEU NEGATIVE 10/23/2020 0946   HGBUR TRACE (A) 10/23/2020 0946   BILIRUBINUR NEGATIVE 10/23/2020 0946   KETONESUR NEGATIVE 10/23/2020 0946   PROTEINUR 100 (A) 10/23/2020 0946   UROBILINOGEN 0.2 03/17/2013 1423   NITRITE NEGATIVE 10/23/2020 0946   LEUKOCYTESUR SMALL (A) 10/23/2020 0946   Sepsis Labs: @LABRCNTIP (procalcitonin:4,lacticidven:4)  ) Recent Results (from the past 240 hour(s))  Resp Panel by RT-PCR (Flu A&B, Covid) Nasopharyngeal Swab     Status: None   Collection Time: 11/17/2020  5:00 AM   Specimen: Nasopharyngeal Swab; Nasopharyngeal(NP) swabs in vial transport medium  Result Value Ref Range Status   SARS Coronavirus 2 by RT PCR NEGATIVE NEGATIVE Final    Comment: (NOTE) SARS-CoV-2 target nucleic acids are NOT DETECTED.  The SARS-CoV-2 RNA is generally detectable in  upper respiratory specimens during the acute phase of infection. The lowest concentration of SARS-CoV-2 viral copies this assay can detect is 138 copies/mL. A negative result does not preclude SARS-Cov-2 infection and should not be used as the sole basis for treatment or other patient management decisions. A negative result may occur with  improper specimen collection/handling, submission of specimen other than nasopharyngeal swab, presence of viral mutation(s) within the areas targeted by this assay, and inadequate number of viral copies(<138 copies/mL). A negative result must be combined with clinical observations, patient history, and epidemiological information. The expected result is Negative.  Fact Sheet for Patients:  EntrepreneurPulse.com.au  Fact  Sheet for Healthcare Providers:  IncredibleEmployment.be  This test is no t yet approved or cleared by the [place] FDA and  has been authorized for detection and/or diagnosis of SARS-CoV-2 by FDA under an Emergency Use Authorization (EUA). This EUA will remain  in effect (meaning this test can be used) for the duration of the COVID-19 declaration under Section 564(b)(1) of the Act, 21 U.S.C.section 360bbb-3(b)(1), unless the authorization is terminated  or revoked sooner.       Influenza A by PCR NEGATIVE NEGATIVE Final   Influenza B by PCR NEGATIVE NEGATIVE Final    Comment: (NOTE) The Xpert Xpress SARS-CoV-2/FLU/RSV plus assay is intended as an aid in the diagnosis of influenza from Nasopharyngeal swab specimens and should not be used as a sole basis for treatment. Nasal washings and aspirates are unacceptable for Xpert Xpress SARS-CoV-2/FLU/RSV testing.  Fact Sheet for Patients: EntrepreneurPulse.com.au  Fact Sheet for Healthcare Providers: IncredibleEmployment.be  This test is not yet approved or cleared by the [place] FDA and has been authorized for detection and/or diagnosis of SARS-CoV-2 by FDA under an Emergency Use Authorization (EUA). This EUA will remain in effect (meaning this test can be used) for the duration of the COVID-19 declaration under Section 564(b)(1) of the Act, 21 U.S.C. section 360bbb-3(b)(1), unless the authorization is terminated or revoked.  Performed at [facility], [place] [address] [place], [place] [address]       Studies: No results found.  Scheduled Meds: . Chlorhexidine Gluconate Cloth  6 each Topical Daily  . scopolamine  1 patch Transdermal Q72H  . zolpidem  5 mg Oral QHS    Continuous Infusions:    LOS: 7 days     Kayleen Memos, MD [facility] Pager [phone]  If 7PM-7AM, please contact night-coverage [email or web address] Password  [facility] 12/13/2020, 3:46 PM

## 2020-12-13 NOTE — Care Management Important Message (Signed)
Important Message  Patient Details  Name: ERMINIE FOULKS MRN: [id number] Date of Birth: 1935/07/28   Medicare Important Message Given:  Yes     [name] 12/13/2020, 10:40 AM

## 2020-12-13 NOTE — Progress Notes (Signed)
   HPI: Cheryl Vargas [id number] y.o.femalewith medical history significant ofhypertension, hyperlipidemia, nonischemic cardiomyopathy, chronic systolic CHF with EF 73-42%AJ milrinone, permanent atrial fibrillation, severe mitral valve regurgitation, hypothyroidism,and CKD stage IIIb presents after having a fall at home.She has been getting around with the use of a walker and has a Actuary.  Patient had just been hospitalized 2/11-2/19 for acute on chronic systolic congestive heart failure exacerbation. She required being placed on milrinone and was treated with IV Lasix. Cardiology had evaluated and evaluated for milrinone at home, but suggested if condition deteriorated patient should be on hospice. She subsequently presented back to the hospital on 3/21, with complaints of shortness of breath and had been given 80 mg of Lasix IV, and switch from p.o. Lasix to torsemide.Since that time she reported that the swelling has improved some and her weight is around 124 pounds. She had been trying to get up from the potty chair this morning when her right leg slipped and she fell hitting her head on the carpet. Denied any loss of consciousness and was able to be helped up by her sitter. She complained of having significant pain in the right leg and had taken 2 Tylenol for symptoms at home. She called hospice and they ultimately got her here to the hospital.   Chart Reviewed. Patient assessed. No acute distress noted. Lethargic. RN recently administered PRN medications for comfort. Patient's daughter, [name] is at the bedside.   Updates and support provided. Patient has been transitioned to full comfort care. Education provided regarding continued symptom management and expectations at end-of-life. Anticipated hospital death.   All questions answered and support provided.   Assessment: Obtunded, shallow breathing Unresponsive Diminished bilaterally   Plan -continue with comfort focused  care -family updated and educated on EOL/symptom management -Anticipated hospital death -PMT will continue to support and follow as needed  Prognosis: hours-days   Time Total:40 min.   Visit consisted of counseling and education dealing with the complex and emotionally intense issues of symptom management and palliative care in the setting of serious and potentially life-threatening illness.Greater than 50%  of this time was spent counseling and coordinating care related to the above assessment and plan.  Alda Lea, AGPCNP-BC  Palliative Medicine Team [phone]

## 2020-12-14 ENCOUNTER — Encounter (HOSPITAL_COMMUNITY): Payer: PPO | Admitting: Cardiology

## 2020-12-14 DIAGNOSIS — I5022 Chronic systolic (congestive) heart failure: Secondary | ICD-10-CM | POA: Diagnosis not present

## 2020-12-14 DIAGNOSIS — W19XXXA Unspecified fall, initial encounter: Secondary | ICD-10-CM | POA: Diagnosis not present

## 2020-12-14 DIAGNOSIS — R52 Pain, unspecified: Secondary | ICD-10-CM

## 2020-12-14 DIAGNOSIS — N179 Acute kidney failure, unspecified: Secondary | ICD-10-CM | POA: Diagnosis not present

## 2020-12-14 DIAGNOSIS — S72001D Fracture of unspecified part of neck of right femur, subsequent encounter for closed fracture with routine healing: Secondary | ICD-10-CM | POA: Diagnosis not present

## 2020-12-14 DIAGNOSIS — Z515 Encounter for palliative care: Secondary | ICD-10-CM | POA: Diagnosis not present

## 2020-12-14 MED ORDER — SODIUM CHLORIDE 0.9 % IV SOLN
600.0000 mg | Freq: Once | INTRAVENOUS | Status: DC
  Filled 2020-12-14: qty 6

## 2020-12-27 DIAGNOSIS — I5042 Chronic combined systolic (congestive) and diastolic (congestive) heart failure: Secondary | ICD-10-CM | POA: Diagnosis not present

## 2021-01-09 NOTE — Progress Notes (Signed)
Patient not responding to staff or family today. Breathing is shallow, slow and easy without s/s of discomfort or difficulty with breathing. Respiratory rate slowly decreasing. Family given emotional support during shift and encouraged to call for any needs or questions any time.  Son, [name] in this morning and daughter arrived mid morning to visit. Frequently checked and visitors visiting during shift.  Patient is being transferred to [facility] because of need of cardiac beds. Explained to patients daughter who verbalized understanding. Patient transferred in bed to [facility] by swat nurses, report given to receiving RN on [place], by phone.

## 2021-01-09 NOTE — Progress Notes (Signed)
Patient ID: Cheryl Vargas, female   DOB: April 11, 1935, 86 y.o.   MRN: [id number]  PROGRESS NOTE    Cheryl Vargas  QAS:[id number] DOB: 15-Oct-1934 DOA: 11/27/2020 PCP: Lavone Orn, MD   Brief Narrative:  86 y.o.female with past medical history of hypertension, hyperlipidemia, nonischemic cardiomyopathy,chronic systolic CHF with LVEF 79-89%QJ milrinone, permanent atrial fibrillation on Xarelto, severe mitral valve regurgitation, hypothyroidism,and CKD stage IIIb presented to hospital after sustaining a fall at home. CT scan of the head did not show any acute intracranial findings. X-ray of the right femur showed impacted femoral neck fracture.  She was seen by orthopedic surgery.  Patient was determined to be high risk candidate for surgical intervention. Conservative management was advised and patient was continued on pain management.   Hospital course complicated by worsening clinical picture, associated with confusion also suspected cardiorenal syndrome.  Seen by cardiology with recommendation for hospice care.  Patient's family has made decision for full comfort measures.  Milrinone discontinued and transitioned to full comfort care on 12/12/2020 and she was started on Dilaudid drip.  Assessment & Plan:   Comfort measures only status Closed fracture of the right femoral neck secondary to mechanical fall Acute on chronic systolic congestive heart failure Severe MR/TR Suspected cardiorenal syndrome Acute metabolic encephalopathy Acute kidney injury Permanent atrial fibrillation on chronic anticoagulation Hypothyroidism Chronic anxiety  Plan  -continue full comfort measures  DVT prophylaxis: None for comfort care Code Status: DNR Family Communication: Son at bedside Disposition Plan: Status is: Inpatient  Remains inpatient appropriate because:Inpatient level of care appropriate due to severity of illness   Dispo:  Patient From: Home  Planned Disposition: Other.  Expect  hospital death  Medically stable for discharge: No    Consultants: Orthopedic/cardiology/palliative care  Procedures: None  Antimicrobials: None   Subjective: Patient seen and examined at bedside.  She looks comfortable.  Objective: Vitals:   12/12/20 0655 12/12/20 0659 12/12/20 2101 12/13/20 1700  BP:  118/81 118/66 113/68  Pulse:  78  79  Resp:  15  20  Temp:  (!) 97.3 F (36.3 C)  98.9 F (37.2 C)  TempSrc:  Oral  Axillary  SpO2:  93%    Weight: 52.4 kg     Height:       No intake or output data in the 24 hours ending December 21, 2020 1100 Filed Weights   12/10/20 0653 12/11/20 0421 12/12/20 0655  Weight: 55.1 kg 52.6 kg 52.4 kg    Examination:  General exam: Appears calm and comfortable.  Looks chronically ill.  Almost unresponsive. Respiratory system: Bilateral decreased breath sounds at bases with scattered crackles Cardiovascular system: S1 & S2 heard, Rate controlled  Data Reviewed: I have personally reviewed following labs and imaging studies  CBC: Recent Labs  Lab 12/08/20 0555 12/09/20 0423  WBC 6.5 5.7  HGB 10.0* 10.1*  HCT 32.7* 32.9*  MCV 73.6* 73.6*  PLT 230 194   Basic Metabolic Panel: Recent Labs  Lab 12/08/20 0555 12/09/20 0423 12/10/20 0641 12/11/20 0500  NA 134* 136 140 139  K 4.1 4.3 4.2 3.7  CL 96* 99 101 101  CO2 29 27 29 28   GLUCOSE 126* 126* 93 121*  BUN 33* 37* 42* 42*  CREATININE 1.78* 1.85* 2.01* 1.94*  CALCIUM 8.6* 8.9 8.9 8.6*  MG 2.2 2.3  --   --   PHOS  --  4.1  --   --    GFR: Estimated Creatinine Clearance: 18.2 mL/min (A) (by C-G formula based  on SCr of 1.94 mg/dL (H)). Liver Function Tests: No results for input(s): AST, ALT, ALKPHOS, BILITOT, PROT, ALBUMIN in the last 168 hours. No results for input(s): LIPASE, AMYLASE in the last 168 hours. No results for input(s): AMMONIA in the last 168 hours. Coagulation Profile: No results for input(s): INR, PROTIME in the last 168 hours. Cardiac Enzymes: No results for  input(s): CKTOTAL, CKMB, CKMBINDEX, TROPONINI in the last 168 hours. BNP (last 3 results) No results for input(s): PROBNP in the last 8760 hours. HbA1C: No results for input(s): HGBA1C in the last 72 hours. CBG: No results for input(s): GLUCAP in the last 168 hours. Lipid Profile: No results for input(s): CHOL, HDL, LDLCALC, TRIG, CHOLHDL, LDLDIRECT in the last 72 hours. Thyroid Function Tests: No results for input(s): TSH, T4TOTAL, FREET4, T3FREE, THYROIDAB in the last 72 hours. Anemia Panel: No results for input(s): VITAMINB12, FOLATE, FERRITIN, TIBC, IRON, RETICCTPCT in the last 72 hours. Sepsis Labs: No results for input(s): PROCALCITON, LATICACIDVEN in the last 168 hours.  Recent Results (from the past 240 hour(s))  Resp Panel by RT-PCR (Flu A&B, Covid) Nasopharyngeal Swab     Status: None   Collection Time: 12/07/2020  5:00 AM   Specimen: Nasopharyngeal Swab; Nasopharyngeal(NP) swabs in vial transport medium  Result Value Ref Range Status   SARS Coronavirus 2 by RT PCR NEGATIVE NEGATIVE Final    Comment: (NOTE) SARS-CoV-2 target nucleic acids are NOT DETECTED.  The SARS-CoV-2 RNA is generally detectable in upper respiratory specimens during the acute phase of infection. The lowest concentration of SARS-CoV-2 viral copies this assay can detect is 138 copies/mL. A negative result does not preclude SARS-Cov-2 infection and should not be used as the sole basis for treatment or other patient management decisions. A negative result may occur with  improper specimen collection/handling, submission of specimen other than nasopharyngeal swab, presence of viral mutation(s) within the areas targeted by this assay, and inadequate number of viral copies(<138 copies/mL). A negative result must be combined with clinical observations, patient history, and epidemiological information. The expected result is Negative.  Fact Sheet for Patients:   EntrepreneurPulse.com.au  Fact Sheet for Healthcare Providers:  IncredibleEmployment.be  This test is no t yet approved or cleared by the [place] FDA and  has been authorized for detection and/or diagnosis of SARS-CoV-2 by FDA under an Emergency Use Authorization (EUA). This EUA will remain  in effect (meaning this test can be used) for the duration of the COVID-19 declaration under Section 564(b)(1) of the Act, 21 U.S.C.section 360bbb-3(b)(1), unless the authorization is terminated  or revoked sooner.       Influenza A by PCR NEGATIVE NEGATIVE Final   Influenza B by PCR NEGATIVE NEGATIVE Final    Comment: (NOTE) The Xpert Xpress SARS-CoV-2/FLU/RSV plus assay is intended as an aid in the diagnosis of influenza from Nasopharyngeal swab specimens and should not be used as a sole basis for treatment. Nasal washings and aspirates are unacceptable for Xpert Xpress SARS-CoV-2/FLU/RSV testing.  Fact Sheet for Patients: EntrepreneurPulse.com.au  Fact Sheet for Healthcare Providers: IncredibleEmployment.be  This test is not yet approved or cleared by the [place] FDA and has been authorized for detection and/or diagnosis of SARS-CoV-2 by FDA under an Emergency Use Authorization (EUA). This EUA will remain in effect (meaning this test can be used) for the duration of the COVID-19 declaration under Section 564(b)(1) of the Act, 21 U.S.C. section 360bbb-3(b)(1), unless the authorization is terminated or revoked.  Performed at [facility],  [address], [place], [place] [address]          Radiology Studies: No results found.      Scheduled Meds: . scopolamine  1 patch Transdermal Q72H   Continuous Infusions: . HYDROmorphone 0.5 mg/hr (12/13/20 1952)          Aline August, MD [facility] 15-Dec-2020, 11:00 AM

## 2021-01-09 NOTE — Progress Notes (Signed)
     Chart Reviewed. Updates Received. Patient Assessed. No acute distress. Resting comfortably. Unresponsive, however will withdraw from painful stimuli and raise eyebrows to touch. Oral care provided and assisted with repositioning. Patient does not open eyes but does grimace with movement.   Son, [name] is at the bedside. Dilaudid drip initiated yesterday afternoon for comfort. Current rate @ 1mg /hr. He reports he stayed overnight with patient and she seemed much more comfortable, requiring one dose of ativan for some agitation.   Education provided on symptom management and end-of-life. Son verbalized understanding and appreciation.   All questions answered and support provided.   Assessment -unresponsive, NAD -shallow breathing -tachycardic   Plan -continue care focusing on comfort/symptom management -education provided to son on symptom management, signs of discomfort, and EOL -anticipated hospital death -PMT will continue to support and follow as needed.   Time Total: 25 min.   Visit consisted of counseling and education dealing with the complex and emotionally intense issues of symptom management and palliative care in the setting of serious and potentially life-threatening illness.Greater than 50%  of this time was spent counseling and coordinating care related to the above assessment and plan.  Alda Lea, AGPCNP-BC  Palliative Medicine Team [phone]

## 2021-01-09 NOTE — Discharge Summary (Signed)
Death Summary  Cheryl Vargas [name] DOB: [birth year] DOA: Dec 07, 2020  PCP: Lavone Orn, MD  Admit date: 11/27/2020 Date of Death: December 15, 2020 Time of Death: 42   History of present illness:  86 y.o.female with past medical history of hypertension, hyperlipidemia, nonischemic cardiomyopathy,chronic systolic CHF with LVEF 92-42%AS milrinone, permanent atrial fibrillation on Xarelto, severe mitral valve regurgitation, hypothyroidism,and CKD stage IIIb presented to hospital after sustaining a fall at home. CT scan of the head did not show any acute intracranial findings. X-ray of the right femur showed impacted femoral neck fracture. She was seen by orthopedic surgery. Patient was determined to be high risk candidate for surgical intervention. Conservative management was advised and patient was continued on pain management. Hospital course complicated by worsening clinical picture, associated with confusion also suspected cardiorenal syndrome. Seen by cardiology with recommendation for hospice care.Patient's family has made decision for full comfort measures. Milrinone discontinued and transitionedto full comfort care on 12/12/2020 and she was started on Dilaudid drip.  She passed away 1 12-15-20 at [facility] and family was notified.  Final Diagnoses:   Comfort measures only status Closed fracture of the right femoral neck secondary to mechanical fall Acute on chronic systolic congestive heart failure Severe MR/TR Suspected cardiorenal syndrome Acute metabolic encephalopathy Acute kidney injury Permanent atrial fibrillation on chronic anticoagulation Hypothyroidism Chronic anxiety   The results of significant diagnostics from this hospitalization (including imaging, microbiology, ancillary and laboratory) are listed below for reference.    Significant Diagnostic Studies: CT Head Wo Contrast  Result Date: 11/26/2020 CLINICAL DATA:  Fall, head injury EXAM: CT HEAD WITHOUT  CONTRAST TECHNIQUE: Contiguous axial images were obtained from the base of the skull through the vertex without intravenous contrast. COMPARISON:  11/29/2020 FINDINGS: Brain: Normal anatomic configuration. Parenchymal volume loss is commensurate with the patient's age. Mild periventricular white matter changes are present likely reflecting the sequela of small vessel ischemia. No abnormal intra or extra-axial mass lesion or fluid collection. No abnormal mass effect or midline shift. No evidence of acute intracranial hemorrhage or infarct. Ventricular size is normal. Cerebellum unremarkable. Vascular: No asymmetric hyperdense vasculature at the skull base. Skull: Intact Sinuses/Orbits: Paranasal sinuses are clear. Orbits are unremarkable. Other: Mastoid air cells and middle ear cavities are clear. IMPRESSION: No acute intracranial abnormality. No calvarial fracture. Mild senescent change. Electronically Signed   By: Fidela Salisbury MD   On: Dec 07, 2020 04:09   CT Head Wo Contrast  Result Date: 11/29/2020 CLINICAL DATA:  Head trauma, coagulopathy. EXAM: CT HEAD WITHOUT CONTRAST TECHNIQUE: Contiguous axial images were obtained from the base of the skull through the vertex without intravenous contrast. COMPARISON:  10/22/2020 head CT. FINDINGS: Brain: Generalized cerebral volume loss. No evidence of parenchymal hemorrhage or extra-axial fluid collection. No mass lesion, mass effect, or midline shift. No CT evidence of acute infarction. Nonspecific mild subcortical and periventricular white matter hypodensity, most in keeping with chronic small vessel ischemic change. No ventriculomegaly. Vascular: No acute abnormality. Skull: No evidence of calvarial fracture. Sinuses/Orbits: The visualized paranasal sinuses are essentially clear. Other:  The mastoid air cells are unopacified. IMPRESSION: 1. No evidence of acute intracranial abnormality. No evidence of calvarial fracture. 2. Generalized cerebral volume loss and mild  chronic small vessel ischemic changes in the cerebral white matter. Electronically Signed   By: Ilona Sorrel M.D.   On: 11/29/2020 11:33   Chest Portable 1 View  Result Date: 07-Dec-2020 CLINICAL DATA:  Fall this morning, hip fracture, initial encounter. EXAM: PORTABLE CHEST 1 VIEW COMPARISON:  11/29/2020. FINDINGS: Trachea is midline. Heart is enlarged, stable. Thoracic aorta is calcified. Right IJ central line tip is in the SVC. Pacemaker and ICD lead tips are stable in position. Lungs are clear.  No pleural fluid. IMPRESSION: 1. No acute findings. 2.  Aortic atherosclerosis (ICD10-I70.0). Electronically Signed   By: Lorin Picket M.D.   On: 11/09/2020 08:38   DG Chest Portable 1 View  Result Date: 11/29/2020 CLINICAL DATA:  Shortness of breath. EXAM: PORTABLE CHEST 1 VIEW COMPARISON:  10/22/2020 FINDINGS: Again noted is cardiomegaly. Patient has a cardiac ICD and embolization coils involving a right lower lobe pulmonary artery. Right jugular central line tip is in the SVC region. Patchy densities at the lung bases are similar to the previous examination. Negative for pulmonary edema or significant airspace disease. Atherosclerotic calcifications at the aortic arch. Negative for a pneumothorax. IMPRESSION: 1. No acute cardiopulmonary disease. 2. Chronic densities at the lung bases that could represent a combination of atelectasis and chronic disease. 3. Stable cardiomegaly with cardiac ICD. Electronically Signed   By: Markus Daft M.D.   On: 11/29/2020 09:50   DG Hip Unilat With Pelvis 2-3 Views Right  Result Date: 11/27/2020 CLINICAL DATA:  Fall with hip pain EXAM: DG HIP (WITH OR WITHOUT PELVIS) 2-3V RIGHT COMPARISON:  08/13/2020 FINDINGS: Impacted subcapital right femoral neck fracture. Left hip arthroplasty which is intact where covered. No evidence of pelvic ring fracture or diastasis. IMPRESSION: Impacted, subcapital femoral neck fracture on the right. Electronically Signed   By: Monte Fantasia  M.D.   On: 11/17/2020 04:18    Microbiology: Recent Results (from the past 240 hour(s))  Resp Panel by RT-PCR (Flu A&B, Covid) Nasopharyngeal Swab     Status: None   Collection Time: 12/02/2020  5:00 AM   Specimen: Nasopharyngeal Swab; Nasopharyngeal(NP) swabs in vial transport medium  Result Value Ref Range Status   SARS Coronavirus 2 by RT PCR NEGATIVE NEGATIVE Final    Comment: (NOTE) SARS-CoV-2 target nucleic acids are NOT DETECTED.  The SARS-CoV-2 RNA is generally detectable in upper respiratory specimens during the acute phase of infection. The lowest concentration of SARS-CoV-2 viral copies this assay can detect is 138 copies/mL. A negative result does not preclude SARS-Cov-2 infection and should not be used as the sole basis for treatment or other patient management decisions. A negative result may occur with  improper specimen collection/handling, submission of specimen other than nasopharyngeal swab, presence of viral mutation(s) within the areas targeted by this assay, and inadequate number of viral copies(<138 copies/mL). A negative result must be combined with clinical observations, patient history, and epidemiological information. The expected result is Negative.  Fact Sheet for Patients:  EntrepreneurPulse.com.au  Fact Sheet for Healthcare Providers:  IncredibleEmployment.be  This test is no t yet approved or cleared by the [place] FDA and  has been authorized for detection and/or diagnosis of SARS-CoV-2 by FDA under an Emergency Use Authorization (EUA). This EUA will remain  in effect (meaning this test can be used) for the duration of the COVID-19 declaration under Section 564(b)(1) of the Act, 21 U.S.C.section 360bbb-3(b)(1), unless the authorization is terminated  or revoked sooner.       Influenza A by PCR NEGATIVE NEGATIVE Final   Influenza B by PCR NEGATIVE NEGATIVE Final    Comment: (NOTE) The Xpert Xpress  SARS-CoV-2/FLU/RSV plus assay is intended as an aid in the diagnosis of influenza from Nasopharyngeal swab specimens and should not be used as a sole basis for treatment.  Nasal washings and aspirates are unacceptable for Xpert Xpress SARS-CoV-2/FLU/RSV testing.  Fact Sheet for Patients: EntrepreneurPulse.com.au  Fact Sheet for Healthcare Providers: IncredibleEmployment.be  This test is not yet approved or cleared by the [place] FDA and has been authorized for detection and/or diagnosis of SARS-CoV-2 by FDA under an Emergency Use Authorization (EUA). This EUA will remain in effect (meaning this test can be used) for the duration of the COVID-19 declaration under Section 564(b)(1) of the Act, 21 U.S.C. section 360bbb-3(b)(1), unless the authorization is terminated or revoked.  Performed at [facility], [place] [address], [place], [place] [address]      Labs: Basic Metabolic Panel: Recent Labs  Lab 12/09/20 0423 12/10/20 0641 12/11/20 0500  NA 136 140 139  K 4.3 4.2 3.7  CL 99 101 101  CO2 27 29 28   GLUCOSE 126* 93 121*  BUN 37* 42* 42*  CREATININE 1.85* 2.01* 1.94*  CALCIUM 8.9 8.9 8.6*  MG 2.3  --   --   PHOS 4.1  --   --    Liver Function Tests: No results for input(s): AST, ALT, ALKPHOS, BILITOT, PROT, ALBUMIN in the last 168 hours. No results for input(s): LIPASE, AMYLASE in the last 168 hours. No results for input(s): AMMONIA in the last 168 hours. CBC: Recent Labs  Lab 12/09/20 0423  WBC 5.7  HGB 10.1*  HCT 32.9*  MCV 73.6*  PLT 236   Cardiac Enzymes: No results for input(s): CKTOTAL, CKMB, CKMBINDEX, TROPONINI in the last 168 hours. D-Dimer No results for input(s): DDIMER in the last 72 hours. BNP: Invalid input(s): POCBNP CBG: No results for input(s): GLUCAP in the last 168 hours. Anemia work up No results for input(s): VITAMINB12, FOLATE, FERRITIN, TIBC, IRON, RETICCTPCT in the last 72  hours. Urinalysis    Component Value Date/Time   COLORURINE YELLOW 10/23/2020 0946   APPEARANCEUR CLEAR 10/23/2020 0946   LABSPEC >1.030 (H) 10/23/2020 0946   PHURINE 5.5 10/23/2020 0946   GLUCOSEU NEGATIVE 10/23/2020 0946   HGBUR TRACE (A) 10/23/2020 0946   BILIRUBINUR NEGATIVE 10/23/2020 0946   KETONESUR NEGATIVE 10/23/2020 0946   PROTEINUR 100 (A) 10/23/2020 0946   UROBILINOGEN 0.2 03/17/2013 1423   NITRITE NEGATIVE 10/23/2020 0946   LEUKOCYTESUR SMALL (A) 10/23/2020 0946   Sepsis Labs Invalid input(s): PROCALCITONIN,  WBC,  LACTICIDVEN     SIGNED:  Aline August, MD  [facility] 12/15/2020, 11:27 AM

## 2021-01-09 DEATH — deceased

## 2021-03-07 NOTE — Telephone Encounter (Signed)
Error

## 2022-05-14 IMAGING — CR DG HIP (WITH OR WITHOUT PELVIS) 2-3V*R*
3 series · 3 of 3 positions shown · non-contrast
Comparison: None.

CLINICAL DATA: Right hip pain following CPR.

EXAM:
DG HIP (WITH OR WITHOUT PELVIS) 2-3V RIGHT

[hip ap]
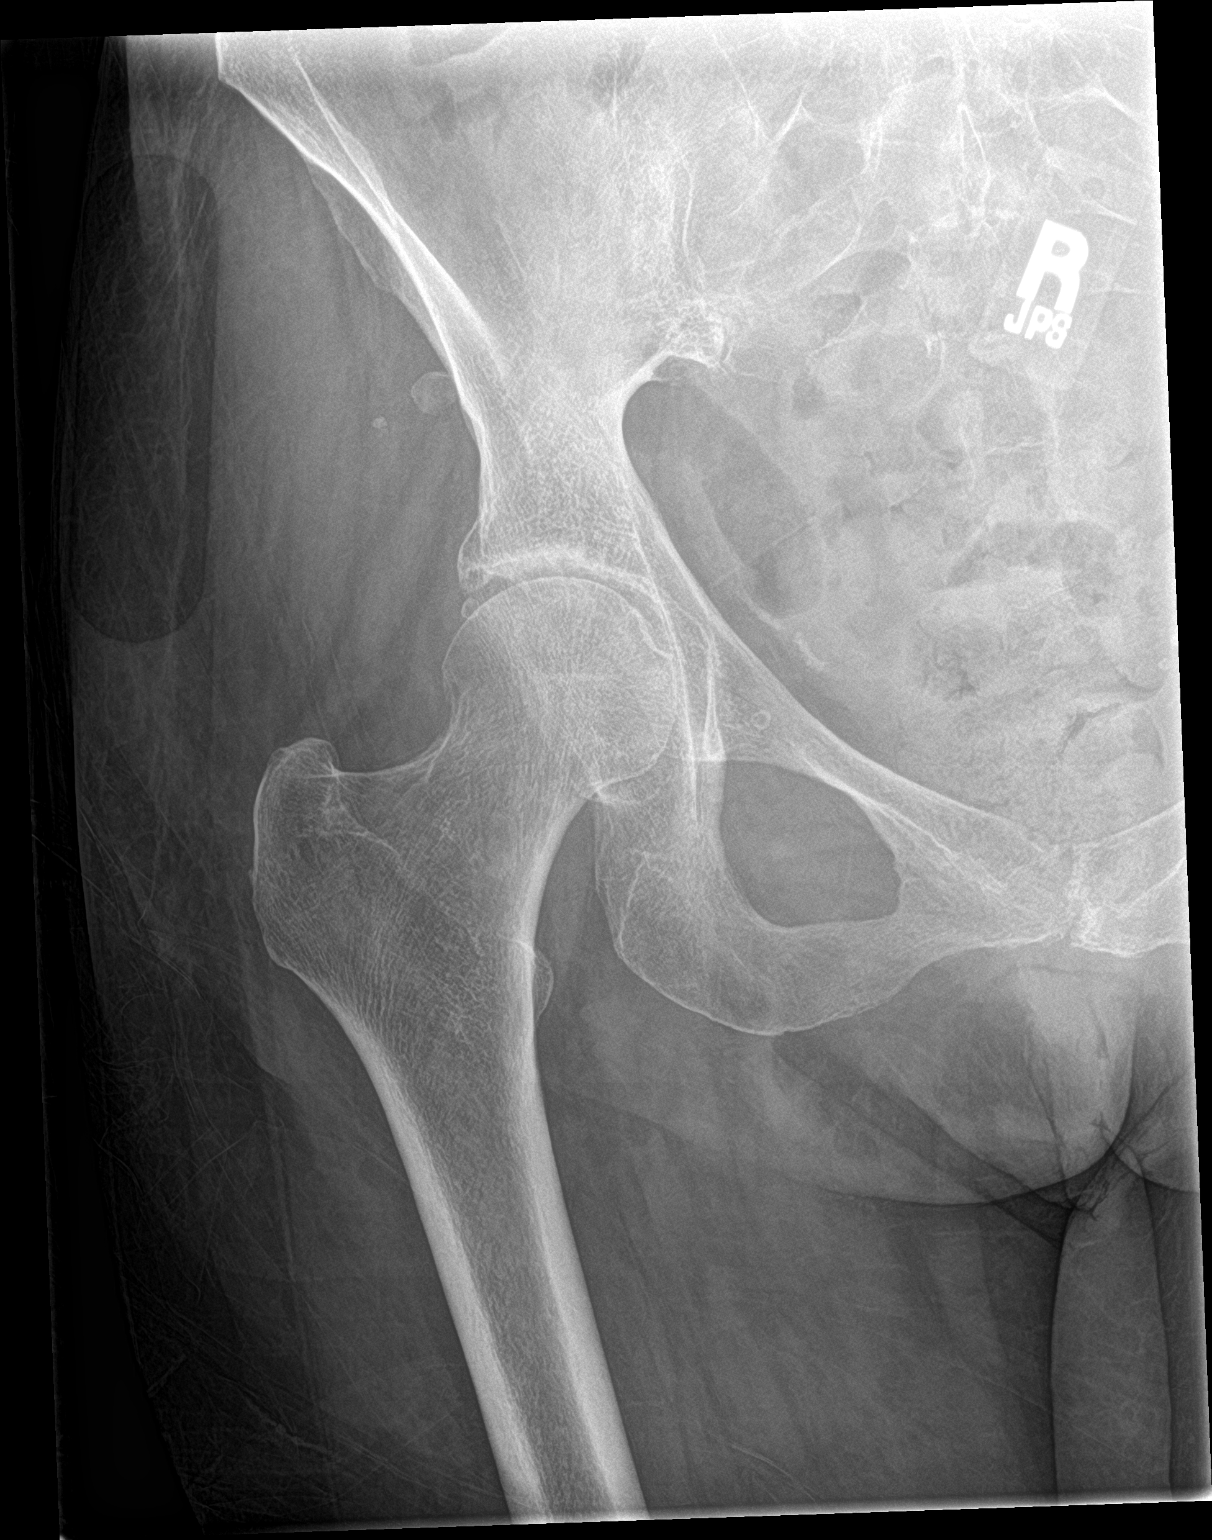

[hip lat]
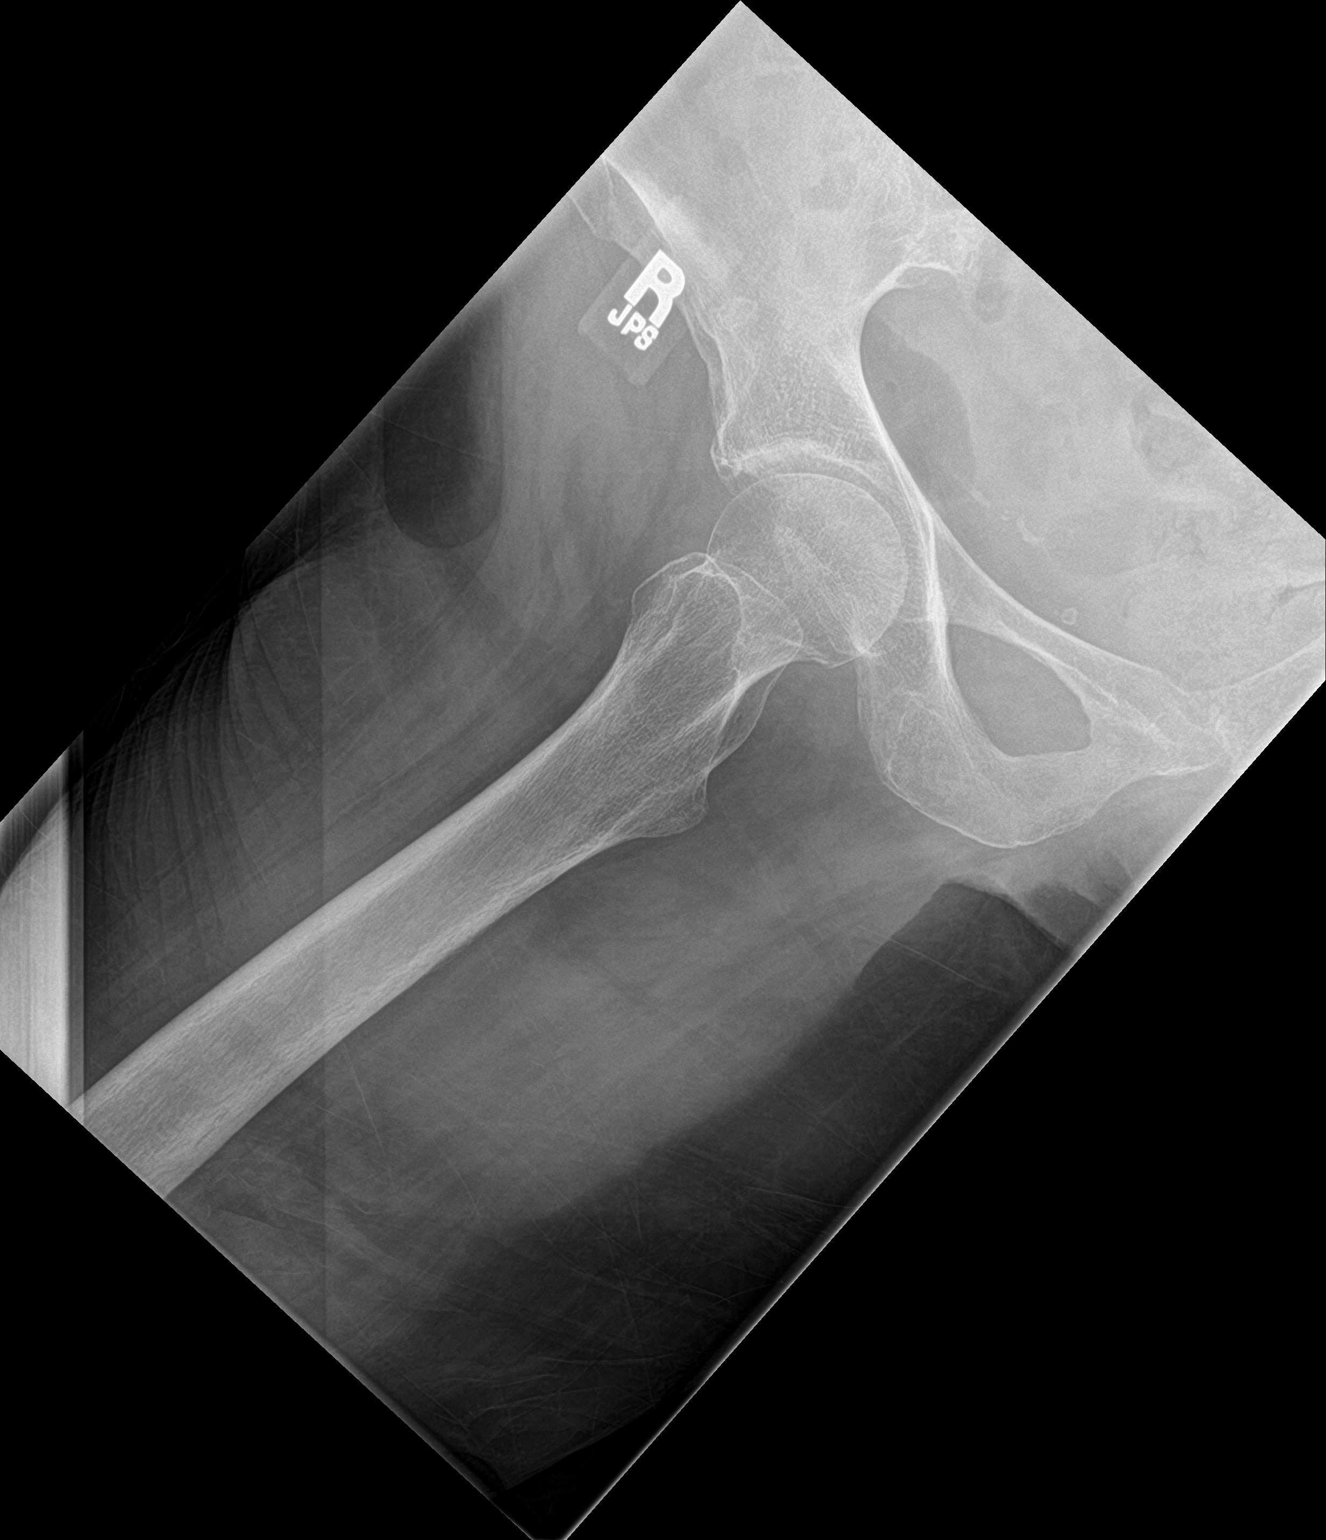

[pelvis ap]
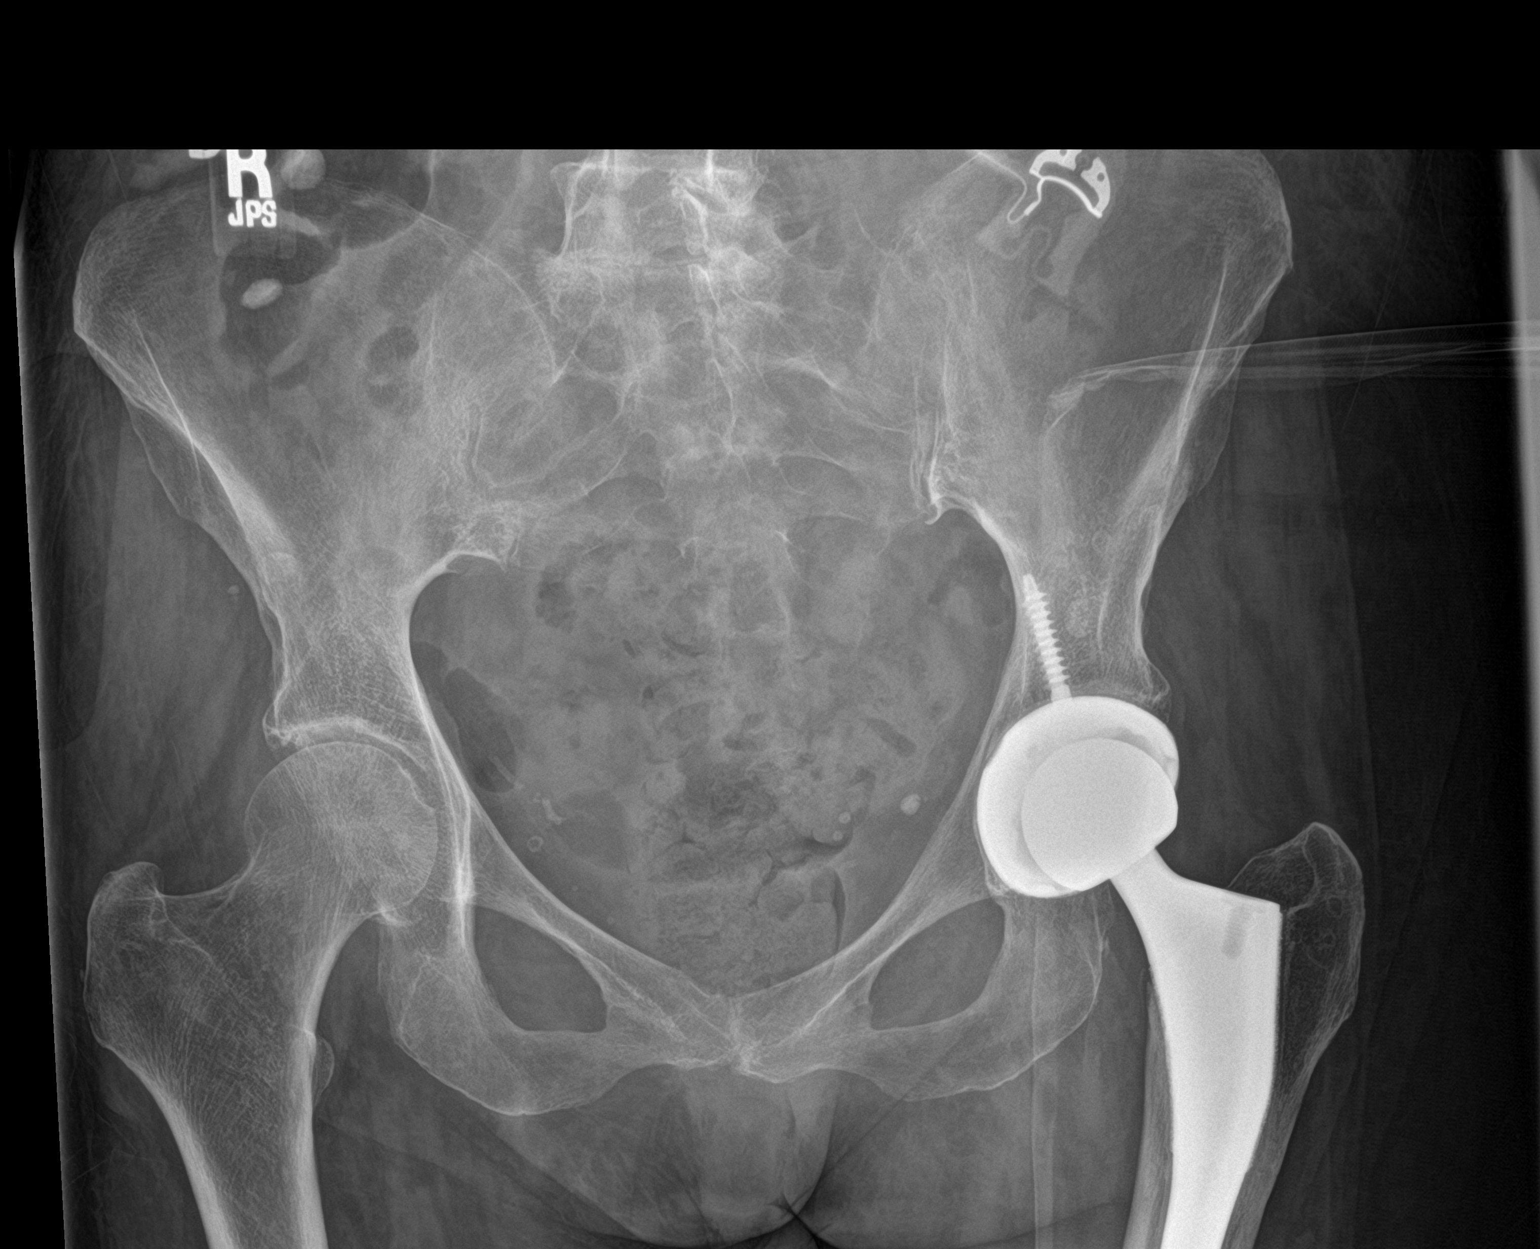

[3 of 3 positions shown; findings below may reference images not displayed]

FINDINGS: No acute fracture or hip dislocation is identified. There is mild
right hip joint space narrowing. A left total hip arthroplasty is
incompletely imaged. Degenerative appearing sclerosis and narrowing
are noted at the pubic symphysis. There is also spurring at the SI
joints.
IMPRESSION: No acute osseous abnormality identified.

## 2022-07-23 IMAGING — CR DG CHEST 2V
2 series · 2 of 2 positions shown · non-contrast
Comparison: 08/02/2020

CLINICAL DATA: Suspected sepsis.

EXAM:
CHEST - 2 VIEW

[chest lat]
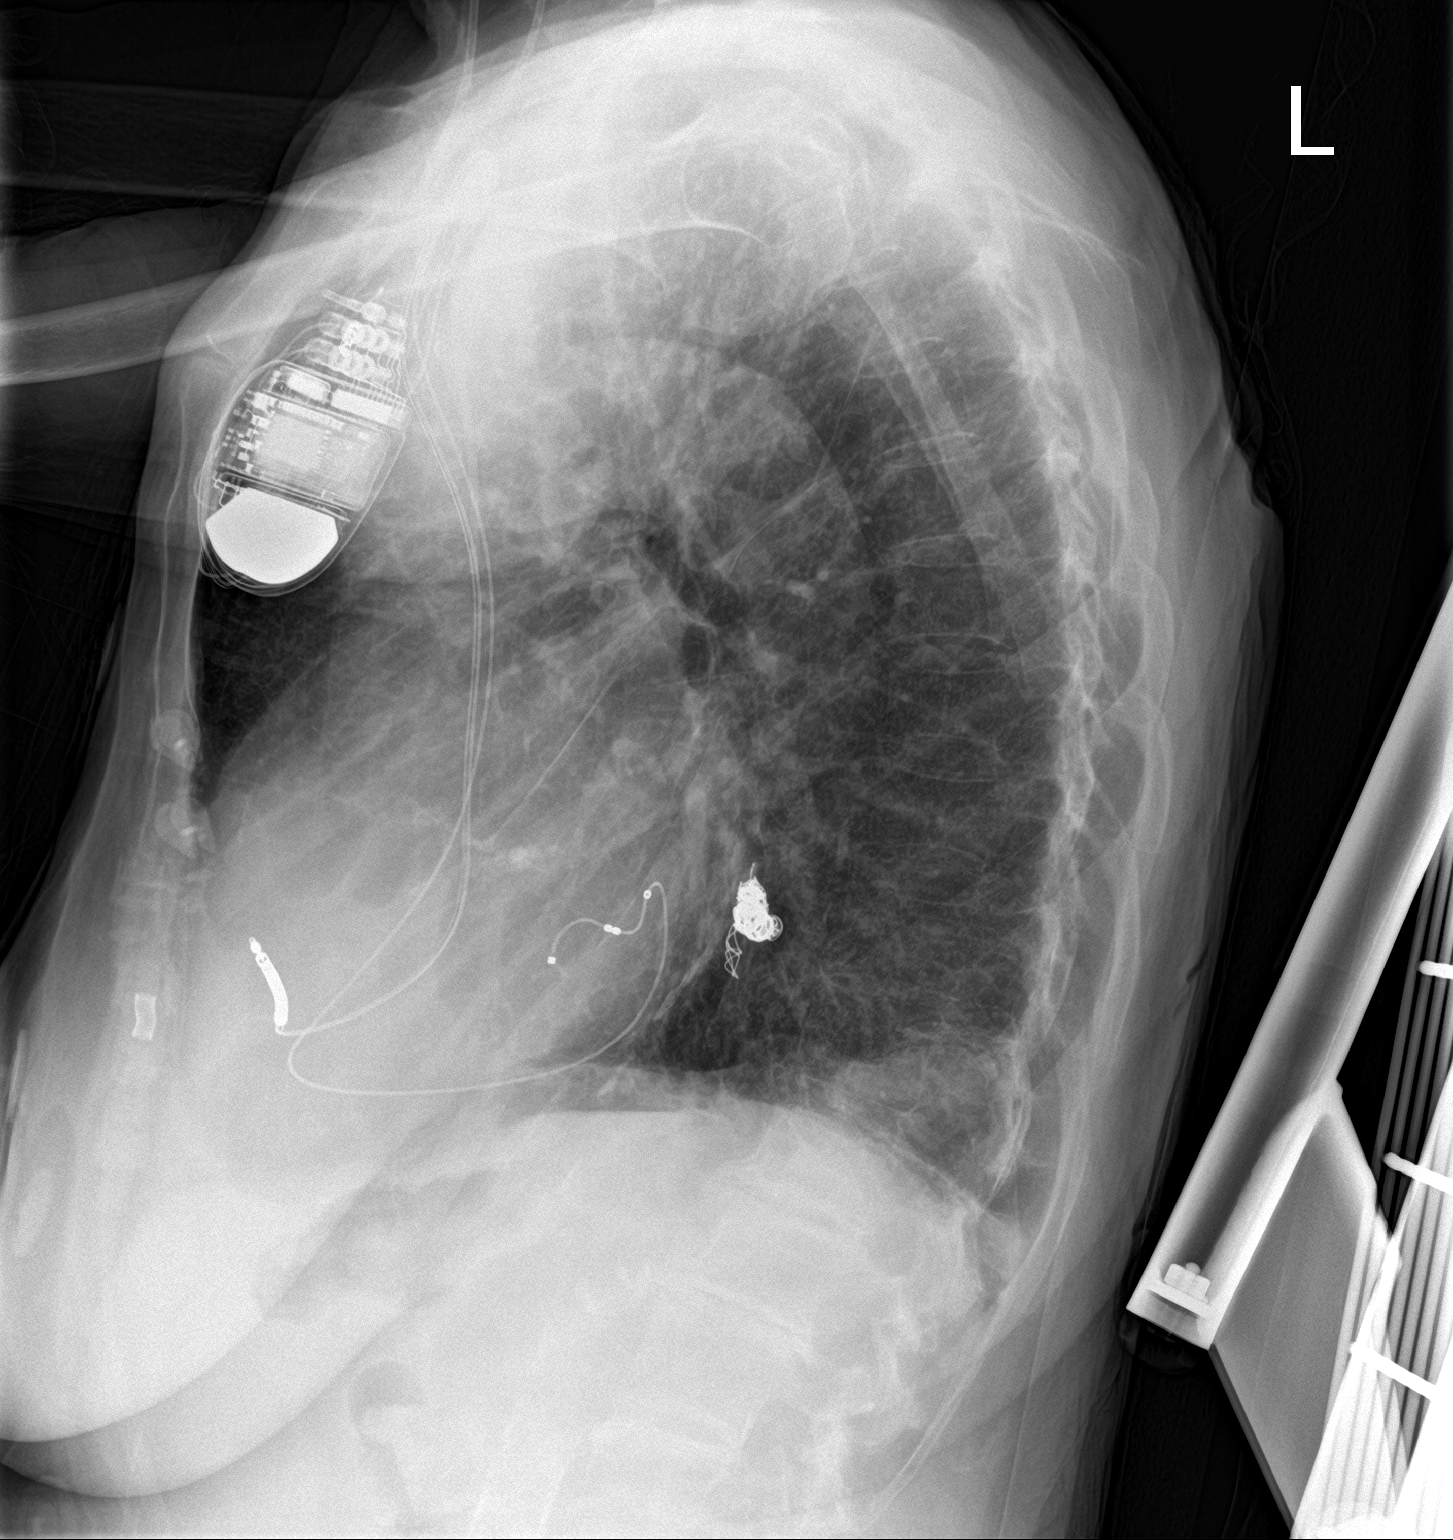

[chest ap]
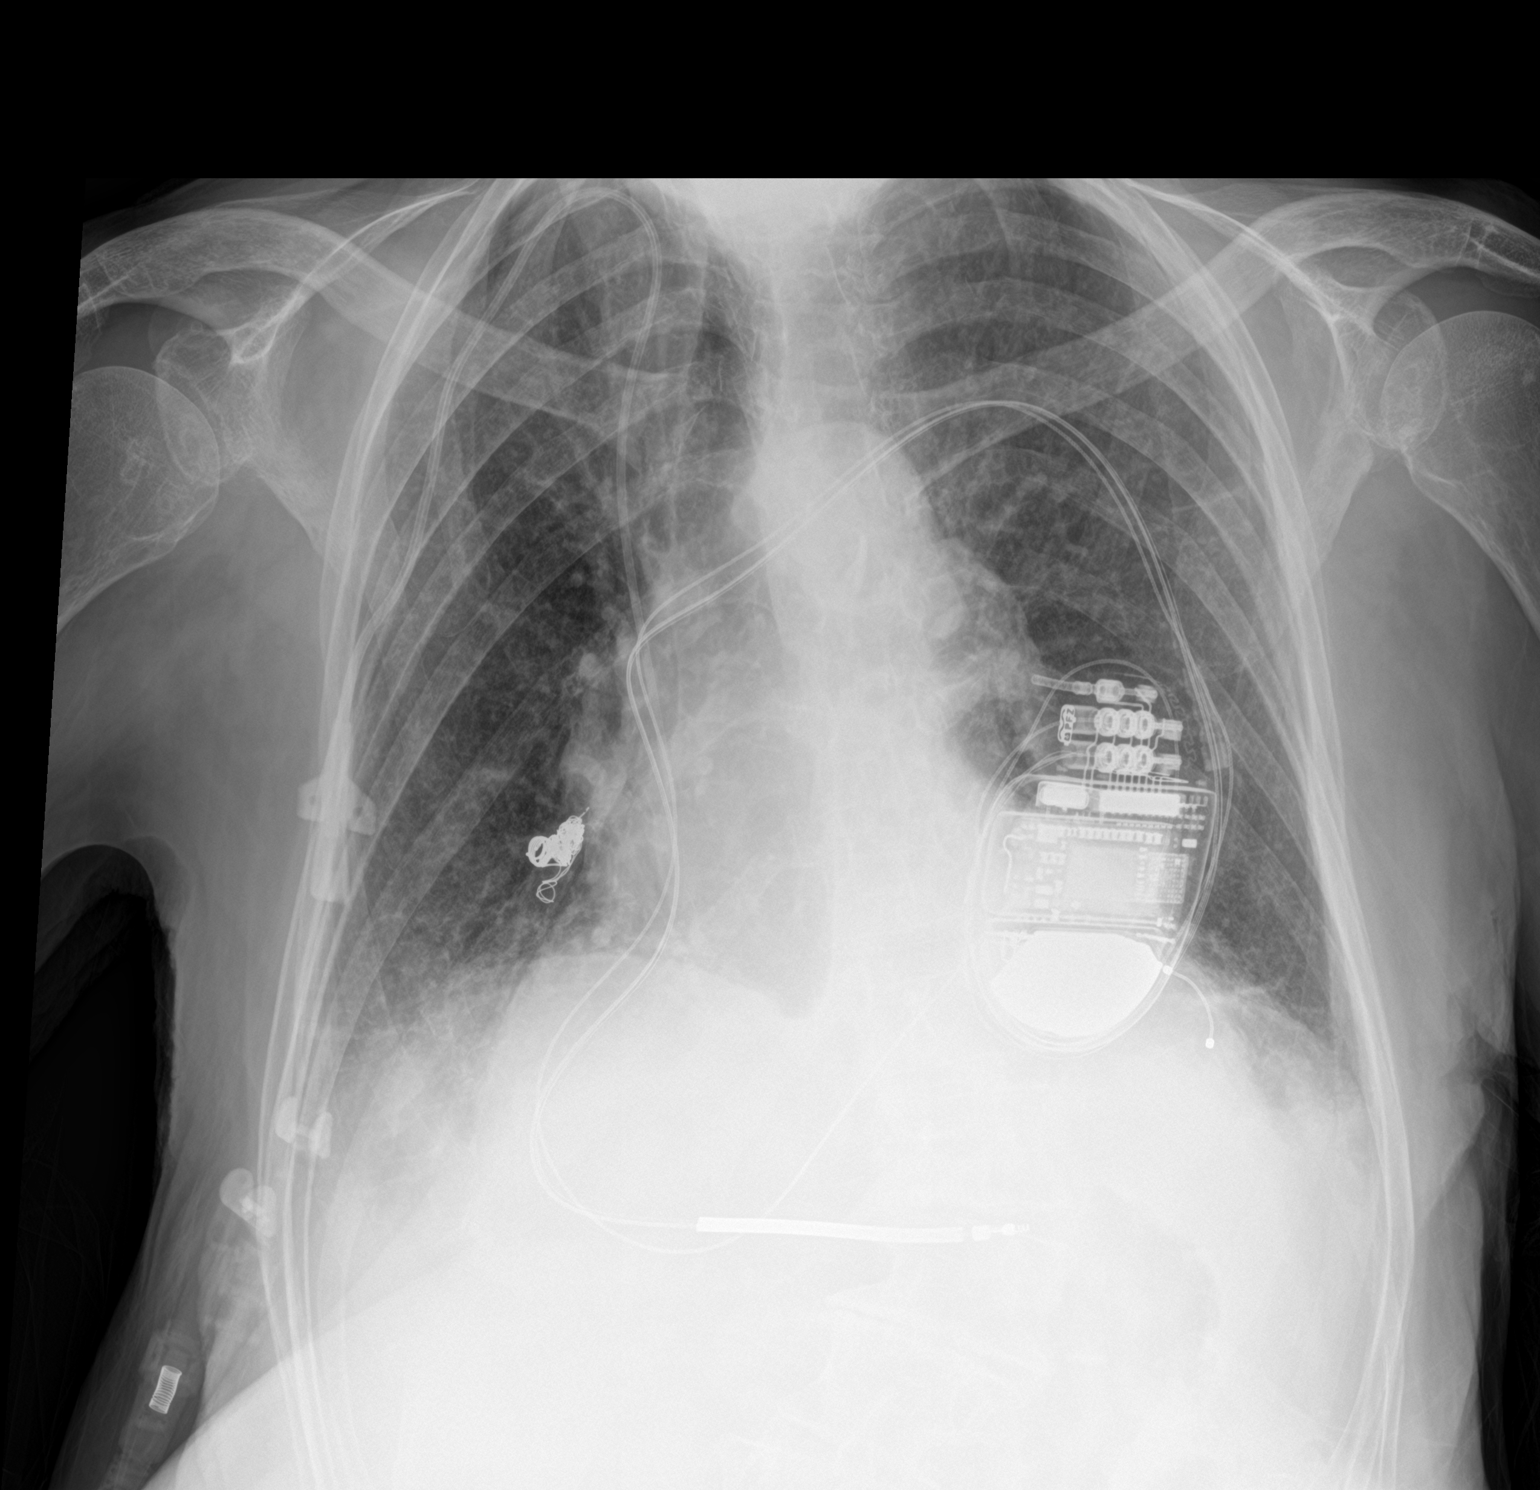

[2 of 2 positions shown; findings below may reference images not displayed]

FINDINGS: Right jugular central venous catheter tip in the lower SVC. AICD in
satisfactory position and unchanged. Interval placement of coils in
the pulmonary vascularity posterior to the left atrium for
pseudoaneurysm of the pulmonary artery.

Mild bibasilar atelectasis similar to the prior study. Negative for
pleural effusion.
IMPRESSION: Mild bibasilar atelectasis unchanged from the prior study.
